# Patient Record
Sex: Female | Born: 1950
Health system: Southern US, Community
[De-identification: ages and names within clinical notes are randomized; demographics above are authoritative.]

## PROBLEM LIST (undated history)

## (undated) DIAGNOSIS — E785 Hyperlipidemia, unspecified: Secondary | ICD-10-CM

## (undated) DIAGNOSIS — Z9889 Other specified postprocedural states: Secondary | ICD-10-CM

## (undated) DIAGNOSIS — Z9989 Dependence on other enabling machines and devices: Secondary | ICD-10-CM

## (undated) DIAGNOSIS — R12 Heartburn: Secondary | ICD-10-CM

## (undated) DIAGNOSIS — M25461 Effusion, right knee: Secondary | ICD-10-CM

## (undated) DIAGNOSIS — R091 Pleurisy: Secondary | ICD-10-CM

## (undated) DIAGNOSIS — M659 Synovitis and tenosynovitis, unspecified: Secondary | ICD-10-CM

## (undated) DIAGNOSIS — M25561 Pain in right knee: Secondary | ICD-10-CM

## (undated) DIAGNOSIS — N289 Disorder of kidney and ureter, unspecified: Secondary | ICD-10-CM

## (undated) DIAGNOSIS — M1990 Unspecified osteoarthritis, unspecified site: Secondary | ICD-10-CM

## (undated) DIAGNOSIS — M109 Gout, unspecified: Secondary | ICD-10-CM

## (undated) DIAGNOSIS — M4800 Spinal stenosis, site unspecified: Secondary | ICD-10-CM

## (undated) DIAGNOSIS — M2550 Pain in unspecified joint: Secondary | ICD-10-CM

## (undated) DIAGNOSIS — G4730 Sleep apnea, unspecified: Secondary | ICD-10-CM

## (undated) DIAGNOSIS — R079 Chest pain, unspecified: Secondary | ICD-10-CM

## (undated) DIAGNOSIS — Z87442 Personal history of urinary calculi: Secondary | ICD-10-CM

## (undated) DIAGNOSIS — I499 Cardiac arrhythmia, unspecified: Secondary | ICD-10-CM

## (undated) DIAGNOSIS — J189 Pneumonia, unspecified organism: Secondary | ICD-10-CM

## (undated) DIAGNOSIS — M6530 Trigger finger, unspecified finger: Secondary | ICD-10-CM

## (undated) DIAGNOSIS — R002 Palpitations: Secondary | ICD-10-CM

## (undated) DIAGNOSIS — M65969 Unspecified synovitis and tenosynovitis, unspecified lower leg: Secondary | ICD-10-CM

## (undated) DIAGNOSIS — G589 Mononeuropathy, unspecified: Secondary | ICD-10-CM

## (undated) DIAGNOSIS — R0600 Dyspnea, unspecified: Secondary | ICD-10-CM

## (undated) DIAGNOSIS — I129 Hypertensive chronic kidney disease with stage 1 through stage 4 chronic kidney disease, or unspecified chronic kidney disease: Secondary | ICD-10-CM

## (undated) DIAGNOSIS — F32A Depression, unspecified: Secondary | ICD-10-CM

## (undated) DIAGNOSIS — E039 Hypothyroidism, unspecified: Secondary | ICD-10-CM

## (undated) DIAGNOSIS — I4891 Unspecified atrial fibrillation: Secondary | ICD-10-CM

## (undated) DIAGNOSIS — K219 Gastro-esophageal reflux disease without esophagitis: Secondary | ICD-10-CM

## (undated) DIAGNOSIS — E669 Obesity, unspecified: Secondary | ICD-10-CM

## (undated) DIAGNOSIS — Z91018 Allergy to other foods: Secondary | ICD-10-CM

## (undated) DIAGNOSIS — M5430 Sciatica, unspecified side: Secondary | ICD-10-CM

## (undated) DIAGNOSIS — G4733 Obstructive sleep apnea (adult) (pediatric): Secondary | ICD-10-CM

## (undated) DIAGNOSIS — D649 Anemia, unspecified: Secondary | ICD-10-CM

## (undated) DIAGNOSIS — E11319 Type 2 diabetes mellitus with unspecified diabetic retinopathy without macular edema: Secondary | ICD-10-CM

## (undated) DIAGNOSIS — E559 Vitamin D deficiency, unspecified: Secondary | ICD-10-CM

## (undated) DIAGNOSIS — M7989 Other specified soft tissue disorders: Secondary | ICD-10-CM

## (undated) DIAGNOSIS — I10 Essential (primary) hypertension: Secondary | ICD-10-CM

## (undated) DIAGNOSIS — R200 Anesthesia of skin: Secondary | ICD-10-CM

## (undated) DIAGNOSIS — R112 Nausea with vomiting, unspecified: Secondary | ICD-10-CM

## (undated) DIAGNOSIS — E7800 Pure hypercholesterolemia, unspecified: Secondary | ICD-10-CM

## (undated) DIAGNOSIS — S149XXA Injury of unspecified nerves of neck, initial encounter: Secondary | ICD-10-CM

## (undated) DIAGNOSIS — R0602 Shortness of breath: Secondary | ICD-10-CM

## (undated) DIAGNOSIS — F329 Major depressive disorder, single episode, unspecified: Secondary | ICD-10-CM

## (undated) DIAGNOSIS — K5900 Constipation, unspecified: Secondary | ICD-10-CM

## (undated) HISTORY — DX: Gout, unspecified: M10.9

## (undated) HISTORY — DX: Sleep apnea, unspecified: G47.30

## (undated) HISTORY — DX: Allergy to other foods: Z91.018

## (undated) HISTORY — DX: Pain in unspecified joint: M25.50

## (undated) HISTORY — DX: Hypertensive chronic kidney disease with stage 1 through stage 4 chronic kidney disease, or unspecified chronic kidney disease: I12.9

## (undated) HISTORY — DX: Type 2 diabetes mellitus with unspecified diabetic retinopathy without macular edema: E11.319

## (undated) HISTORY — DX: Other specified soft tissue disorders: M79.89

## (undated) HISTORY — DX: Spinal stenosis, site unspecified: M48.00

## (undated) HISTORY — DX: Disorder of kidney and ureter, unspecified: N28.9

## (undated) HISTORY — DX: Unspecified atrial fibrillation: I48.91

## (undated) HISTORY — DX: Heartburn: R12

## (undated) HISTORY — DX: Chest pain, unspecified: R07.9

## (undated) HISTORY — DX: Pure hypercholesterolemia, unspecified: E78.00

## (undated) HISTORY — DX: Obesity, unspecified: E66.9

## (undated) HISTORY — DX: Pleurisy: R09.1

## (undated) HISTORY — DX: Shortness of breath: R06.02

## (undated) HISTORY — DX: Constipation, unspecified: K59.00

## (undated) HISTORY — DX: Sciatica, unspecified side: M54.30

## (undated) HISTORY — PX: TONSILLECTOMY AND ADENOIDECTOMY: SUR1326

## (undated) HISTORY — DX: Palpitations: R00.2

## (undated) HISTORY — DX: Vitamin D deficiency, unspecified: E55.9

---

## 2000-02-14 ENCOUNTER — Encounter: Payer: Self-pay | Admitting: Internal Medicine

## 2000-02-14 ENCOUNTER — Encounter: Admission: RE | Admit: 2000-02-14 | Discharge: 2000-02-14 | Payer: Self-pay | Admitting: Internal Medicine

## 2000-07-05 ENCOUNTER — Encounter: Admission: RE | Admit: 2000-07-05 | Discharge: 2000-10-03 | Payer: Self-pay | Admitting: Internal Medicine

## 2000-10-16 ENCOUNTER — Encounter: Admission: RE | Admit: 2000-10-16 | Discharge: 2001-01-14 | Payer: Self-pay | Admitting: Internal Medicine

## 2001-03-27 ENCOUNTER — Encounter: Admission: RE | Admit: 2001-03-27 | Discharge: 2001-06-25 | Payer: Self-pay | Admitting: Internal Medicine

## 2001-12-28 ENCOUNTER — Encounter: Payer: Self-pay | Admitting: Internal Medicine

## 2001-12-28 ENCOUNTER — Encounter: Admission: RE | Admit: 2001-12-28 | Discharge: 2001-12-28 | Payer: Self-pay | Admitting: Internal Medicine

## 2003-02-11 ENCOUNTER — Encounter: Admission: RE | Admit: 2003-02-11 | Discharge: 2003-05-12 | Payer: Self-pay | Admitting: Internal Medicine

## 2003-09-09 ENCOUNTER — Ambulatory Visit (HOSPITAL_COMMUNITY): Admission: RE | Admit: 2003-09-09 | Discharge: 2003-09-09 | Payer: Self-pay | Admitting: Orthopaedic Surgery

## 2003-09-09 ENCOUNTER — Ambulatory Visit (HOSPITAL_BASED_OUTPATIENT_CLINIC_OR_DEPARTMENT_OTHER): Admission: RE | Admit: 2003-09-09 | Discharge: 2003-09-09 | Payer: Self-pay | Admitting: Orthopaedic Surgery

## 2003-09-09 HISTORY — PX: OTHER SURGICAL HISTORY: SHX169

## 2004-01-13 ENCOUNTER — Ambulatory Visit (HOSPITAL_BASED_OUTPATIENT_CLINIC_OR_DEPARTMENT_OTHER): Admission: RE | Admit: 2004-01-13 | Discharge: 2004-01-13 | Payer: Self-pay | Admitting: Orthopaedic Surgery

## 2004-01-13 ENCOUNTER — Ambulatory Visit (HOSPITAL_COMMUNITY): Admission: RE | Admit: 2004-01-13 | Discharge: 2004-01-13 | Payer: Self-pay | Admitting: Orthopaedic Surgery

## 2004-01-13 HISTORY — PX: OTHER SURGICAL HISTORY: SHX169

## 2004-09-07 ENCOUNTER — Ambulatory Visit (HOSPITAL_BASED_OUTPATIENT_CLINIC_OR_DEPARTMENT_OTHER): Admission: RE | Admit: 2004-09-07 | Discharge: 2004-09-07 | Payer: Self-pay | Admitting: Orthopaedic Surgery

## 2004-09-07 ENCOUNTER — Ambulatory Visit (HOSPITAL_COMMUNITY): Admission: RE | Admit: 2004-09-07 | Discharge: 2004-09-07 | Payer: Self-pay | Admitting: Orthopaedic Surgery

## 2004-09-07 HISTORY — PX: SHOULDER ARTHROSCOPY DISTAL CLAVICLE EXCISION AND OPEN ROTATOR CUFF REPAIR: SHX2396

## 2005-02-22 ENCOUNTER — Ambulatory Visit (HOSPITAL_COMMUNITY): Admission: RE | Admit: 2005-02-22 | Discharge: 2005-02-22 | Payer: Self-pay | Admitting: Orthopaedic Surgery

## 2005-02-22 ENCOUNTER — Ambulatory Visit (HOSPITAL_BASED_OUTPATIENT_CLINIC_OR_DEPARTMENT_OTHER): Admission: RE | Admit: 2005-02-22 | Discharge: 2005-02-22 | Payer: Self-pay | Admitting: Orthopaedic Surgery

## 2005-02-22 HISTORY — PX: OTHER SURGICAL HISTORY: SHX169

## 2005-07-20 ENCOUNTER — Encounter: Admission: RE | Admit: 2005-07-20 | Discharge: 2005-07-20 | Payer: Self-pay | Admitting: Internal Medicine

## 2005-07-29 ENCOUNTER — Encounter: Admission: RE | Admit: 2005-07-29 | Discharge: 2005-07-29 | Payer: Self-pay | Admitting: Internal Medicine

## 2005-11-29 ENCOUNTER — Ambulatory Visit (HOSPITAL_BASED_OUTPATIENT_CLINIC_OR_DEPARTMENT_OTHER): Admission: RE | Admit: 2005-11-29 | Discharge: 2005-11-29 | Payer: Self-pay | Admitting: Orthopaedic Surgery

## 2005-11-29 HISTORY — PX: SHOULDER ARTHROSCOPY W/ ACROMIAL REPAIR: SUR94

## 2006-11-28 ENCOUNTER — Ambulatory Visit (HOSPITAL_BASED_OUTPATIENT_CLINIC_OR_DEPARTMENT_OTHER): Admission: RE | Admit: 2006-11-28 | Discharge: 2006-11-28 | Payer: Self-pay | Admitting: Orthopaedic Surgery

## 2006-11-28 HISTORY — PX: OTHER SURGICAL HISTORY: SHX169

## 2008-08-26 ENCOUNTER — Ambulatory Visit (HOSPITAL_BASED_OUTPATIENT_CLINIC_OR_DEPARTMENT_OTHER): Admission: RE | Admit: 2008-08-26 | Discharge: 2008-08-26 | Payer: Self-pay | Admitting: Orthopaedic Surgery

## 2008-08-26 HISTORY — PX: OTHER SURGICAL HISTORY: SHX169

## 2009-03-12 ENCOUNTER — Ambulatory Visit (HOSPITAL_COMMUNITY): Admission: RE | Admit: 2009-03-12 | Discharge: 2009-03-12 | Payer: Self-pay | Admitting: Orthopaedic Surgery

## 2009-07-14 ENCOUNTER — Ambulatory Visit (HOSPITAL_BASED_OUTPATIENT_CLINIC_OR_DEPARTMENT_OTHER): Admission: RE | Admit: 2009-07-14 | Discharge: 2009-07-14 | Payer: Self-pay | Admitting: Orthopedic Surgery

## 2009-07-14 HISTORY — PX: OTHER SURGICAL HISTORY: SHX169

## 2009-12-14 ENCOUNTER — Inpatient Hospital Stay (HOSPITAL_COMMUNITY): Admission: RE | Admit: 2009-12-14 | Discharge: 2009-12-17 | Payer: Self-pay | Admitting: Orthopedic Surgery

## 2009-12-14 HISTORY — PX: TOTAL KNEE ARTHROPLASTY: SHX125

## 2010-01-13 ENCOUNTER — Ambulatory Visit (HOSPITAL_COMMUNITY): Admission: RE | Admit: 2010-01-13 | Discharge: 2010-01-13 | Payer: Self-pay | Admitting: Orthopedic Surgery

## 2010-01-13 ENCOUNTER — Ambulatory Visit: Payer: Self-pay | Admitting: Vascular Surgery

## 2010-01-13 ENCOUNTER — Encounter (INDEPENDENT_AMBULATORY_CARE_PROVIDER_SITE_OTHER): Payer: Self-pay | Admitting: Orthopedic Surgery

## 2010-05-27 LAB — CBC
HCT: 29.8 % — ABNORMAL LOW (ref 36.0–46.0)
HCT: 30.6 % — ABNORMAL LOW (ref 36.0–46.0)
Hemoglobin: 10.2 g/dL — ABNORMAL LOW (ref 12.0–15.0)
Hemoglobin: 10.3 g/dL — ABNORMAL LOW (ref 12.0–15.0)
Hemoglobin: 10.6 g/dL — ABNORMAL LOW (ref 12.0–15.0)
MCH: 30.1 pg (ref 26.0–34.0)
MCH: 31.2 pg (ref 26.0–34.0)
MCH: 29.9 pg (ref 26.0–34.0)
MCHC: 33.5 g/dL (ref 30.0–36.0)
MCHC: 34.5 g/dL (ref 30.0–36.0)
MCHC: 33.3 g/dL (ref 30.0–36.0)
MCV: 89.7 fL (ref 78.0–100.0)
MCV: 89.9 fL (ref 78.0–100.0)
Platelets: 281 10*3/uL (ref 150–400)
RBC: 3.54 MIL/uL — ABNORMAL LOW (ref 3.87–5.11)

## 2010-05-27 LAB — GLUCOSE, CAPILLARY
Glucose-Capillary: 108 mg/dL — ABNORMAL HIGH (ref 70–99)
Glucose-Capillary: 151 mg/dL — ABNORMAL HIGH (ref 70–99)
Glucose-Capillary: 167 mg/dL — ABNORMAL HIGH (ref 70–99)
Glucose-Capillary: 169 mg/dL — ABNORMAL HIGH (ref 70–99)
Glucose-Capillary: 176 mg/dL — ABNORMAL HIGH (ref 70–99)
Glucose-Capillary: 203 mg/dL — ABNORMAL HIGH (ref 70–99)
Glucose-Capillary: 87 mg/dL (ref 70–99)

## 2010-05-27 LAB — URINALYSIS, ROUTINE W REFLEX MICROSCOPIC
Bilirubin Urine: NEGATIVE
Ketones, ur: NEGATIVE mg/dL
Nitrite: NEGATIVE
Specific Gravity, Urine: 1.019 (ref 1.005–1.030)
Urobilinogen, UA: 1 mg/dL (ref 0.0–1.0)
pH: 5.5 (ref 5.0–8.0)

## 2010-05-27 LAB — BASIC METABOLIC PANEL
BUN: 18 mg/dL (ref 6–23)
Chloride: 102 mEq/L (ref 96–112)
GFR calc non Af Amer: 50 mL/min — ABNORMAL LOW (ref >60)
Glucose, Bld: 141 mg/dL — ABNORMAL HIGH (ref 70–99)
Potassium: 4.2 mEq/L (ref 3.5–5.1)
Potassium: 4.7 mEq/L (ref 3.5–5.1)
Sodium: 135 mEq/L (ref 135–145)

## 2010-05-27 LAB — URINE MICROSCOPIC-ADD ON

## 2010-05-27 LAB — ABO/RH: ABO/RH(D): A POS

## 2010-05-27 LAB — MRSA CULTURE

## 2010-05-27 LAB — APTT: aPTT: 31 seconds (ref 24–37)

## 2010-05-27 LAB — PROTIME-INR
INR: 1.11 (ref 0.00–1.49)
INR: 1.47 (ref 0.00–1.49)
INR: 1.04 (ref 0.00–1.49)
Prothrombin Time: 14.5 seconds (ref 11.6–15.2)
Prothrombin Time: 18.9 seconds — ABNORMAL HIGH (ref 11.6–15.2)

## 2010-05-27 LAB — COMPREHENSIVE METABOLIC PANEL
AST: 22 U/L (ref 0–37)
Albumin: 3.7 g/dL (ref 3.5–5.2)
BUN: 23 mg/dL (ref 6–23)
Calcium: 9.3 mg/dL (ref 8.4–10.5)
Creatinine, Ser: 1.29 mg/dL — ABNORMAL HIGH (ref 0.4–1.2)
GFR calc Af Amer: 51 mL/min — ABNORMAL LOW (ref >60)
GFR calc non Af Amer: 42 mL/min — ABNORMAL LOW (ref >60)

## 2010-06-01 LAB — BASIC METABOLIC PANEL
BUN: 25 mg/dL — ABNORMAL HIGH (ref 6–23)
CO2: 28 mEq/L (ref 19–32)
Calcium: 9.2 mg/dL (ref 8.4–10.5)
Chloride: 105 mEq/L (ref 96–112)
Creatinine, Ser: 1.1 mg/dL (ref 0.4–1.2)
GFR calc Af Amer: >60 mL/min (ref >60)
Glucose, Bld: 70 mg/dL (ref 70–99)

## 2010-06-01 LAB — GLUCOSE, CAPILLARY: Glucose-Capillary: 104 mg/dL — ABNORMAL HIGH (ref 70–99)

## 2010-06-01 LAB — POCT HEMOGLOBIN-HEMACUE: Hemoglobin: 10.5 g/dL — ABNORMAL LOW (ref 12.0–15.0)

## 2010-06-21 LAB — BASIC METABOLIC PANEL
BUN: 21 mg/dL (ref 6–23)
CO2: 26 mEq/L (ref 19–32)
Calcium: 10 mg/dL (ref 8.4–10.5)
Creatinine, Ser: 0.9 mg/dL (ref 0.4–1.2)
GFR calc non Af Amer: >60 mL/min (ref >60)
Glucose, Bld: 174 mg/dL — ABNORMAL HIGH (ref 70–99)
Sodium: 140 mEq/L (ref 135–145)

## 2010-06-21 LAB — GLUCOSE, CAPILLARY: Glucose-Capillary: 157 mg/dL — ABNORMAL HIGH (ref 70–99)

## 2010-07-27 NOTE — Op Note (Signed)
NAME:  [name], [name]                 ACCOUNT NO.:  [id number]   MEDICAL RECORD NO.:  [id number]          PATIENT TYPE:  AMB   LOCATION:  DSC                          FACILITY:  [facility]   PHYSICIAN:  Lubertha Basque. Dalldorf, M.D.DATE OF BIRTH:  1950/04/24   DATE OF PROCEDURE:  08/26/2008  DATE OF DISCHARGE:                               OPERATIVE REPORT   PREOPERATIVE DIAGNOSES:  1. Left carpal tunnel syndrome.  2. Left middle trigger finger.  3. Left ring trigger finger.   POSTOPERATIVE DIAGNOSES:  1. Left carpal tunnel syndrome.  2. Left middle trigger finger.  3. Left ring trigger finger.   PROCEDURES:  1. Left carpal tunnel release.  2. Left middle trigger finger release.  3. Left ring trigger finger release.   ANESTHESIA:  Bier block MAC.   ATTENDING SURGEON:  Lubertha Basque. Jerl Santos, MD   ASSISTANT:  Lindwood Qua, PA   INDICATIONS FOR PROCEDURE:  The patient is a 60 year old woman with a  long history of bilateral hand difficulties.  She is status post a  successful carpal tunnel release and trigger finger release on the one  side and now has the same problem on the left.  She has struggled with  difficulty despite various injections and bracing.  By nerve conduction  study, she does have advanced carpal tunnel on the left and at this  point, she is offered operative intervention.  Informed operative  consent was obtained after discussion of possible complications  including reaction to anesthesia, infection, and neurovascular injury.   SUMMARY/FINDINGS AND PROCEDURE:  Under Bier block and MAC, a set of left  knee hand procedures were performed.  We initially performed a carpal  tunnel release.  She had a severely thickened transverse carpal  ligament, which we released.  We then performed trigger finger releases  of the long and ring fingers.  The ring finger seemed to have the most  significantly thickened pulley.  All 3 sites were closed primarily.   DESCRIPTION OF  PROCEDURE:  The patient was taken to operating suite  where Bier block was applied to the level of the forearm.  She was also  given some sedation.  We also supplemented with some local lidocaine.  She was prepped and draped in normal sterile fashion.  After  administration of the preop IV Kefzol, a small palmar incision was made  ulnar to the thenar flexion crease.  This did not cross the wrist  flexion crease.  Dissection was carried down through palmar fascia to  expose the transverse carpal ligament, which was severely thickened and  released under direct visualization.  The dissection was taken distally  towards the transverse arch of vessels and proximally to the distal  fascia of the forearm.  This did appear to be applying some significant  compression to the underlying median nerve.  This wound was irrigated  followed by reapproximation of skin with nylon.  We then made an  incision along the middle finger flexor tendon sheath region.  This was  done transversely between the proximal and distal palmar flexion  creases.  Dissection  was carried down to the flexor tendon sheath and  the A0 and A1 pulleys were released under direct visualization.  We then  made an incision along the ring finger flexor tendon sheath at the  distal palmar flexion crease.  Dissection was carried down through the  transverse incision to expose the flexor tendon sheath and the sheath  was released here as well.  This seemed to be thicker than the sheath at  the middle finger.  Nevertheless, both of these wounds were then  irrigated followed by reapproximation of the skin with nylon in both  sites.  Adaptic was applied at all 3 sites followed by dry gauze and a  volar splint of plaster with the wrist in slight extension.  The  tourniquet was deflated, and the fingers became pink and warm  immediately.  Estimated blood loss, intraoperative fluids, as well as  accurate tourniquet time can be obtained from the  anesthesia records.   DISPOSITION:  The patient was taken to recovery room in stable addition.  She was to go home same day and follow up with me in my office next  week.  She stayed awake through the entire procedure.      Lubertha Basque Jerl Santos, [name]  Electronically Signed     PGD/MEDQ  D:  08/26/2008  T:  08/26/2008  Job:  [id number]

## 2010-07-27 NOTE — Op Note (Signed)
NAME:  [name], [name]                 ACCOUNT NO.:  [id number]   MEDICAL RECORD NO.:  [id number]          PATIENT TYPE:  AMB   LOCATION:  DSC                          FACILITY:  [facility]   PHYSICIAN:  Lubertha Basque. Dalldorf, M.D.DATE OF BIRTH:  [birth year]   DATE OF PROCEDURE:  11/28/2006  DATE OF DISCHARGE:                               OPERATIVE REPORT   PREOPERATIVE DIAGNOSIS:  1. Right trigger thumb.  2. Right carpal tunnel syndrome.   POSTOPERATIVE DIAGNOSIS:  1. Right trigger thumb.  2. Right carpal tunnel syndrome.   PROCEDURE:  1. Right trigger thumb release.  2. Right carpal tunnel release.   ANESTHESIA:  Bier block and MAC.   ATTENDING SURGEON:  Lubertha Basque. Jerl Santos, [name]   ASSISTANT:  Lindwood Qua, P.A.-C.   INDICATIONS FOR PROCEDURE:  The patient is a 60 year old woman with a  long history of a painful trigger thumb and numbness in her median  distribution of the right hand.  She has had multiple trigger thumb  injections and all have afforded her transient relief.  She has nerve  conduction finding consistent with bilateral severe carpal tunnel  syndrome.  She has also failed splinting for her carpal tunnel syndrome.  At this point, she is offered operative intervention at the right hand  to consist of a trigger thumb release and a carpal tunnel release.  Informed operative consent was obtained after a discussion of the  possible complications of reaction to anesthesia, infection,  neurovascular injury.   SUMMARY OF FINDINGS AND PROCEDURE:  Under Bier block and MAC, two  procedures were performed on the right hand.  First, I made a small  incision at the MCP flexion crease and performed an A1 pulley release  addressing her trigger thumb.  We then performed a separate small palmar  incision and released a very thick transverse carpal ligament addressing  her carpal tunnel syndrome issue.   DESCRIPTION OF PROCEDURE:  The patient was taken to the operating suite  where  a Bier block was applied without difficulty.  She was also given  some sedation.  She was positioned supine and prepped and draped in a  normal sterile fashion.  After the administration of IV Kefzol, a small  incision was made at the MCP flexion crease of the thumb.  Dissection  was carried down to the flexor tendon sheath.  An area consistent with  the A1 pulley was released with scissors.  At this point, we had the  patient actively flex and extend her thumb and no active triggering  remained.  This wound was irrigated followed by reapproximation of the  skin with vertical mattresses of nylon.   We then made a small palmar incision ulnar to the thenar flexion crease.  This did not cross the wrist flexion crease.  Dissection was carried  down through the palmar fascia to expose the transverse carpal ligament.  This was released under direct visualization.  This was moderately  thickened applying compression to the nerve below.  Dissection was taken  distally to the transverse arch of vessels and proximally through the  distal fascia of the forearm.  This wound was irrigated followed by  reapproximation of the skin with vertical mattresses of nylon.  We  injected some Marcaine about the skin edges at both incision sites.  Adaptic was applied to both incisions followed by dry gauze and a volar  splint of plaster with the wrist in slight extension.  Estimated blood  loss and intraoperative fluids can be obtained from anesthesia records  as can accurate tourniquet time.  We did release the tourniquet at the  end of closure.   DISPOSITION:  The patient was taken to the recovery room in stable  addition.  She was to go home same day and follow up in the office in  less than a week.  I will contact her by phone tonight.      Lubertha Basque Jerl Santos, [name]  Electronically Signed     PGD/MEDQ  D:  11/28/2006  T:  11/28/2006  Job:  [id number]

## 2010-07-30 NOTE — Op Note (Signed)
NAME:  [name], [name]                 ACCOUNT NO.:  [id number]   MEDICAL RECORD NO.:  [id number]          PATIENT TYPE:  AMB   LOCATION:  DSC                          FACILITY:  [facility]   PHYSICIAN:  Lubertha Basque. Dalldorf, M.D.DATE OF BIRTH:  08-19-1950   DATE OF PROCEDURE:  01/13/2004  DATE OF DISCHARGE:                                 OPERATIVE REPORT   PREOPERATIVE DIAGNOSES:  1.  Right shoulder impingement.  2.  Right shoulder partial rotator cuff tear.   PROCEDURE:  1.  Right shoulder arthroscopic acromioplasty.  2.  Right shoulder arthroscopic rotator cuff debridement.   ANESTHESIA:  General.   ATTENDING SURGEON:  Lubertha Basque. Jerl Santos, [name]   ASSISTANT:  Lindwood Qua, P.A.   INDICATIONS FOR PROCEDURE:  The patient is a 60 year old woman with a long  history of right shoulder pain.  This has persisted despite oral anti-  inflammatories and rest.  She is status post a successful arthroscopy of the  opposite shoulder for similar difficulty and elects to have the same  procedure on this side.  Informed operative consent was obtained after  discussion of possible complication of reaction to anesthesia and infection.   DESCRIPTION OF PROCEDURE:  The patient was taken to the operating suite  where general anesthetic was supplied without difficulty.  She was  positioned in beach chair position and prepped and draped in a normal  sterile fashion.  After administration of IV antibiotics, an arthroscopy of  the right shoulder was performed through two portals.  Glenohumeral joint  showed no degenerative change and the biceps tendon appeared benign.  The  rotator cuff did have a partial thickness tear seen from below, but this  encompassed less than 10% of the thickness of the rotator cuff.  From above,  the rotator cuff appeared benign after bursectomy and debridement.  She did  have a prominent subacromial spur addressed with an acromioplasty back to a  flat surface.  This was done with  the bur in the lateral position followed  by transfer of the bur to the posterior position.  The [facility] joint was not  addressed as she had no pain in that location, and I found no spurs  arthroscopically.  The shoulder was thoroughly irrigated at the end of the  case followed by placement of Marcaine with epinephrine and morphine.  Simple sutures of nylon were used to loosely reapproximate the portals  followed by Adaptic and a dry gauze dressing with tape.  Estimated blood  loss and fluids can be obtained from anesthesia records.   DISPOSITION:  The patient was extubated in the operating room and taken to  the recovery room in stable condition.  Plans were for her to go home the  same day and follow up in less than a week.  I will contact her by phone  tonight.       PGD/MEDQ  D:  01/13/2004  T:  01/13/2004  Job:  [id number]

## 2010-07-30 NOTE — Op Note (Signed)
NAME:  [name], [name]                 ACCOUNT NO.:  [id number]   MEDICAL RECORD NO.:  [id number]          PATIENT TYPE:  AMB   LOCATION:  DSC                          FACILITY:  [facility]   PHYSICIAN:  Lubertha Basque. Dalldorf, M.D.DATE OF BIRTH:  07-Mar-1951   DATE OF PROCEDURE:  02/22/2005  DATE OF DISCHARGE:                                 OPERATIVE REPORT   PREOPERATIVE DIAGNOSIS:  Left shoulder recurrent rotator cuff tear.  1.  Left trigger thumb   POSTOPERATIVE DIAGNOSES:  1.  Left shoulder adhesions.  2.  Left trigger thumb.   PROCEDURES:  1.  Left shoulder arthroscopic debridement.  2.  Left trigger thumb release.   ANESTHESIA:  General.   ATTENDING SURGEON:  Lubertha Basque. Jerl Santos, [name]   ASSISTANT:  Lindwood Qua, P.A.   INDICATIONS FOR PROCEDURE:  The patient is a 60 year old woman with a  complicated history of left shoulder difficulty.  Having failed conservative  measures, she underwent an arthroscopy about a year ago where we performed  an acromioplasty and debridement of a partial-thickness tear.  She did not  get better and about six months ago we went back and performed an open  repair of this partial tear.  Unfortunately, she has persisted with some  pain with overhead use, which is disabling to her, and she is offered  another procedure after an MRI scan recently showed a nonretracted full-  thickness rotator cuff tear.  She has also been troubled with a trigger  thumb for many months and has had injection, which have helped in a  transient fashion.  She elects to have this addressed at the same setting.  Informed operative consent was obtained after discussion of the possible  complications of and reaction to anesthesia, infection, neurovascular injury  related to today's procedures.   DESCRIPTION OF PROCEDURE:  The patient was taken to the operating suite,  where a general anesthetic was applied without difficulty.  She was  positioned in beach-chair position and  prepped and draped in a normal  sterile fashion.  The entire left arm was prepped to the fingertips.  After  the administration of preop IV Kefzol, an arthroscopy of the left shoulder  was performed through three old portals.  The glenohumeral joint showed no  degenerative changes, and the biceps tendon and biceps anchor appeared  intact in the joint.  The rotator cuff appeared to be intact from below.  She did have an abundance of scar tissue in the glenohumeral joint, and this  was addressed with a debridement.  I then entered the subacromial space and  again I found no evidence of a rotator cuff tear.  The superior surface of  the cuff looked smooth and I could find no irregularity to suggest any  tearing.  She did not have any significant impingement, and no additional  bone work was done.  The shoulder was irrigated followed by placement of  Marcaine with epinephrine and morphine.  Simple sutures of nylon was used to  loosely reapproximate the three portals, followed by Adaptic, dry gauze.  We  then addressed  the left thumb.  Her arm was exsanguinated, followed by  reinflation of a tourniquet about her forearm to 250 pounds of pressure.  A  small incision was made in the thenar flexion crease with dissection down to  the flexor tendon sheath.  I released the tight pulley in the area her  triggering and she seemed to move very well thereafter, though obviously  this was passive motion.  Care was taken to retract the neurovascular bundle  out of harm's way during the case.  This wound was irrigated, followed by  release of the tourniquet.  The fingertip became pink and warm and a small  amount of bleeding was easily controlled with some pressure.  A couple of  nylon sutures were used to loosely reapproximate this incision, followed by  Adaptic and dry gauze with a loose Ace wrap.  Some tape was applied to her  shoulder dressing.  Estimated blood loss and intraoperative fluids can be   obtained from anesthesia records, as can accurate tourniquet time.   DISPOSITION:  The patient was extubated in the operating room and taken to  the recovery room in stable addition.  Plans were for her to go home the same day and to follow up in the office in  less than a week.  I will contact her by phone tonight.      Lubertha Basque Jerl Santos, [name]  Electronically Signed     PGD/MEDQ  D:  02/22/2005  T:  02/23/2005  Job:  [id number]

## 2010-07-30 NOTE — Op Note (Signed)
NAME:  [name], [name]                 ACCOUNT NO.:  [id number]   MEDICAL RECORD NO.:  [id number]          PATIENT TYPE:  AMB   LOCATION:  DSC                          FACILITY:  [facility]   PHYSICIAN:  Lubertha Basque. Dalldorf, M.D.DATE OF BIRTH:  1950-06-07   DATE OF PROCEDURE:  DATE OF DISCHARGE:  09/07/2004                                 OPERATIVE REPORT   PREOPERATIVE DIAGNOSIS:  Left shoulder partial-thickness rotator cuff tear.   POSTOPERATIVE DIAGNOSIS:  Left shoulder partial-thickness rotator cuff tear.   PROCEDURES:  1. Left shoulder arthroscopic debridement.  2. Left shoulder open rotator cuff repair.   ANESTHESIA:  General and block.   ATTENDING SURGEON:  Lubertha Basque. Jerl Santos, [name]   ASSISTANT:  Lindwood Qua, P.A.   INDICATION FOR PROCEDURE:  The patient is a 60 year old woman is almost one  year to the day to an arthroscopy of her left shoulder. At that point, we  performed an acromioplasty and a debridement of a partial-thickness rotator  cuff tear seen on the articular surface. Unfortunately she has persisted  with pain. She has trouble using this arm for meaningful purposes above her  head and out to the side and also has difficulty sleeping on it at night.  She has failed extensive physical therapy and postoperative subacromial  injections. She is status post a successful decompression on the opposite  shoulder. At this point she is offered repeat operation. An MRI scan has  again shown a partial-thickness tear. She is offered an arthroscopy with  probable open repair of a rotator cuff partial tear. Informed operative  consent was obtained after discussion of the possible applications of  reaction to anesthesia and infection.   DESCRIPTION OF PROCEDURE:  The patient was taken to the operating suite  where general anesthetic was supplied without difficulty. She also given a  block in the preanesthesia area. She was positioned in beach chair position  and prepped and  draped in normal sterile fashion. After administration of IV  antibiotics, an arthroscopy of the left shoulder was performed through a  total of two portals. The glenohumeral joint showed no degenerative changes  and the biceps anchor appeared intact. The rotator cuff still had about a 40-  50% partial-thickness tear seen from below. In the subacromial space, things  looked relatively normal with some bursitis debrided, but the rotator cuff  appeared benign. No additional decompression was done of the subacromial  space. I then went back to the glenohumeral joint and passed a tag suture  through the rotator cuff to the partially torn area. I then made an open  approach through a deltoid split and a small anterolateral incision. I  traced our tag stitch down to the rotator cuff and found the area of the  partial-thickness tear which could more easily be seen from below. I used a  15 blade to elevate this tear, effectively completing the tear. I created  about a 1 cm rotator cuff tear and excised some devitalized tissue. I  created a bleeding bed of bone with a rongeur. A single 5.5 mm Arthrex  anchor  was placed fairly medial followed by placement of all four limbs of  the suture through the rotator cuff. These sutures were tied on top of the  rotator cuff and then I used to of the push lock anchors by Arthrex to  repair this rotator cuff broadly in a smooth fashion to the bleeding bed of  bone. I then placed the scope back in the shoulder and from the glenohumeral  joint her rotator cuff did appear to be repaired as planned. The shoulder  was thoroughly irrigated followed by reapproximation of the fascial layers  with Vicryl and the subcutaneous tissues with the same suture. The skin was  closed with nylon. Some Marcaine was injected about the incision site  followed by Adaptic and a dry gauze dressing. We also used nylon to  reapproximate her portals loosely. Estimated blood loss and fluids  can be  obtained from the anesthesia records.   DISPOSITION:  The patient was extubated in the operating room and taken to  the recovery room in stable addition. Plans were for her to go home the same  day and to follow up in the office in less than a week.  I will contact by  phone tonight.       PGD/MEDQ  D:  09/07/2004  T:  09/07/2004  Job:  [id number]

## 2010-07-30 NOTE — Op Note (Signed)
NAME:  [name], [name]                             ACCOUNT NO.:  [id number]   MEDICAL RECORD NO.:  [id number]                   PATIENT TYPE:  AMB   LOCATION:  DSC                                  FACILITY:  [facility]   PHYSICIAN:  Lubertha Basque. Jerl Santos, [name]             DATE OF BIRTH:  05-01-1950   DATE OF PROCEDURE:  09/09/2003  DATE OF DISCHARGE:                                 OPERATIVE REPORT   PREOPERATIVE DIAGNOSES:  1. Left shoulder impingement.  2. Left shoulder partial rotator cuff tear.   POSTOPERATIVE DIAGNOSES:  1. Left shoulder impingement.  2. Left shoulder partial rotator cuff tear.   OPERATION PERFORMED:  1. Left shoulder arthroscopic acromioplasty.  2. Left shoulder arthroscopic debridement.   SURGEON:  Lubertha Basque. Jerl Santos, [name]   ASSISTANT:  Prince Rome, P.A.   ANESTHESIA:  General.   INDICATIONS FOR PROCEDURE:  The patient is a 60 year old woman with about a  year of left shoulder pain.  This has persisted despite an exercise program  and a subacromial injection which did afford her transient relief.  She has  a prominent subacromial morphology.  She has not had an MRI scan.  She has  pain which limits her ability to rest and limits her ability to use her arm  overhead and out in front of herself.  She was offered an arthroscopy.  Informed operative consent was obtained after discussion of possible  complications of reaction to anesthesia and infection.   DESCRIPTION OF PROCEDURE:  The patient was taken to the operating suite  where general anesthetic was applied without difficulty.  The patient was  positioned in beach chair position and prepped and draped in the normal  sterile fashion.  After the administration of preop intravenous antibiotics,  an arthroscopy of the left shoulder was performed through a total of three  portals.  Glenohumeral joint showed no degenerative change and the biceps  anchor and labral structures appeared intact.  She did have an  articular  surface partial rotator cuff tear at the supraspinatus attachment point.  This was about 40% the thickness of the rotator cuff and was thoroughly  debrided.  In the subacromial space she had some irritation of the bursal  surface of the cuff but no tear was seen in this location.  She had a  prominent subacromial morphology which we addressed with an acromioplasty  back to a flat surface.  This was done with the bur in the lateral position  followed by transfer of the bur to the posterior position.  A thorough  bursectomy was done and then the entire rotator cuff was thoroughly  inspected from above.  The shoulder was irrigated at the end of the case  followed by placement of Marcaine with epinephrine and morphine.  Simple  sutures of nylon were used to loosely reapproximate the portals followed by  Adaptic and dry gauze dressing with  tape.  Estimated blood loss and  intraoperative fluids can be obtained from anesthesia records.   DISPOSITION:  The patient was extubated in the operating room and taken to  the recovery room in stable condition.  Plans were for the patient to go  home the same day and to follow up in the office in less than a week.  I  will contact her by phone tonight.                                               Lubertha Basque Jerl Santos, [name]    PGD/MEDQ  D:  09/09/2003  T:  09/09/2003  Job:  [id number]

## 2010-07-30 NOTE — Op Note (Signed)
NAME:  [name], [name]                 ACCOUNT NO.:  [id number]   MEDICAL RECORD NO.:  [id number]          PATIENT TYPE:  AMB   LOCATION:  DSC                          FACILITY:  [facility]   PHYSICIAN:  Lubertha Basque. Dalldorf, M.D.DATE OF BIRTH:  1950-06-01   DATE OF PROCEDURE:  11/29/2005  DATE OF DISCHARGE:                                 OPERATIVE REPORT   PREOPERATIVE DIAGNOSIS:  Left shoulder acromioclavicular degeneration.   POSTOPERATIVE DIAGNOSIS:  Left shoulder acromioclavicular degeneration.   PROCEDURE:  1. Left shoulder arthroscopic AC resection.  2. Left shoulder arthroscopic debridement.   ANESTHESIA:  General.   ATTENDING SURGEON:  Lubertha Basque. Jerl Santos, [name]   ASSISTANT:  Lindwood Qua, P.A.   INDICATIONS FOR PROCEDURE:  The patient is a 60 year old woman with a long  complicated history of left shoulder difficulty.  She initially was  addressed with an acromioplasty having failed conservative measures to treat  impingement related pain.  She next came to an open assisted rotator cuff  repair.  She subsequently had a third procedure about 9 months ago for lysis  of adhesions.  She has regained her motion but unfortunately has been having  pain at her [facility] joint.  We have injected her on three occasions with  transient relief achieved with each of these.  She has pain at the [facility] joint  which now limits her ability use her arm and to rest on this side.  She is  offered another procedure on her shoulder to consist of an arthroscopy with  the [facility] resection.  Informed operative consent was obtained discussion of  possible complications of reaction to anesthesia and infection.   SUMMARY OF FINDINGS AND PROCEDURE:  Under general anesthesia through three  portals an arthroscopy of the left shoulder was performed.  The glenohumeral  joint showed no degenerative changes and the biceps tendon appeared normal  throughout.  The rotator cuff appeared attached from below but certainly not  normal.  From above the cuff appeared completely normal.  A thorough  bursectomy was done and no significant tearing could be seen.  One of her  old sutures was well visualized.  No additional acromioplasty was performed  but at the [facility] joint there was bone-on-bone contact with a spur at the distal  clavicle.  A formal AC decompression was done with the bur through the  additional anterior portal.  Bryna Colander assisted throughout and was  invaluable to the completion of the case in that he helped pass instruments  and position so I could perform the procedure.  He also closed portals at  the end of the case.   DESCRIPTION OF PROCEDURE:  The patient was taken to operating suite where  general anesthetic was applied without difficulty.  She was positioned in  beach-chair position and prepped, draped in normal sterile fashion.  After  the administration of preop IV Kefzol, an arthroscopy of the left shoulder  was performed through a total of three portals.  Findings were as noted  above and procedure consisted of an [facility] resection with an arthroscopic bur  and a  debridement of partial rotator cuff tear seen from below and bursitis  seen from above.  She was thoroughly irrigated at end of the case followed  by reapproximation of portals loosely with nylon.  Some Marcaine with  epinephrine and morphine were injected in the subacromial space.  Adaptic  was applied to the wounds followed by dry gauze and tape.  Estimated blood  loss and intraoperative fluids can be obtained from anesthesia records.   DISPOSITION:  The patient was extubated in the operating room and taken to  recovery in stable addition.  She was to go home the same day and follow up  in the office in less than a week.  I will contact her by phone tonight.      Lubertha Basque Jerl Santos, [name]  Electronically Signed     PGD/MEDQ  D:  11/29/2005  T:  11/30/2005  Job:  [id number]

## 2010-11-26 ENCOUNTER — Other Ambulatory Visit (HOSPITAL_COMMUNITY): Payer: Self-pay | Admitting: Orthopedic Surgery

## 2010-11-26 DIAGNOSIS — M25561 Pain in right knee: Secondary | ICD-10-CM

## 2010-11-26 DIAGNOSIS — T84032A Mechanical loosening of internal right knee prosthetic joint, initial encounter: Secondary | ICD-10-CM

## 2010-12-08 ENCOUNTER — Encounter (HOSPITAL_COMMUNITY)
Admission: RE | Admit: 2010-12-08 | Discharge: 2010-12-08 | Disposition: A | Discharge status: Home / Self Care | Payer: 59 | Source: Ambulatory Visit | Attending: Orthopedic Surgery | Admitting: Orthopedic Surgery

## 2010-12-08 DIAGNOSIS — T84032A Mechanical loosening of internal right knee prosthetic joint, initial encounter: Secondary | ICD-10-CM

## 2010-12-08 DIAGNOSIS — M25561 Pain in right knee: Secondary | ICD-10-CM

## 2010-12-08 DIAGNOSIS — Z96659 Presence of unspecified artificial knee joint: Secondary | ICD-10-CM | POA: Insufficient documentation

## 2010-12-08 DIAGNOSIS — M25569 Pain in unspecified knee: Secondary | ICD-10-CM | POA: Insufficient documentation

## 2010-12-08 MED ORDER — TECHNETIUM TC 99M MEDRONATE IV KIT
24.0000 | PACK | Freq: Once | INTRAVENOUS | Status: AC | PRN
  Administered 2010-12-08: 24 via INTRAVENOUS

## 2010-12-24 LAB — BASIC METABOLIC PANEL
CO2: 28
Calcium: 9
GFR calc Af Amer: >60
Glucose, Bld: 212 — ABNORMAL HIGH
Potassium: 4.4
Sodium: 138

## 2011-01-12 ENCOUNTER — Other Ambulatory Visit (HOSPITAL_COMMUNITY): Payer: Self-pay | Admitting: Orthopaedic Surgery

## 2011-01-12 DIAGNOSIS — M5410 Radiculopathy, site unspecified: Secondary | ICD-10-CM

## 2011-01-17 ENCOUNTER — Ambulatory Visit (HOSPITAL_COMMUNITY)
Admission: RE | Admit: 2011-01-17 | Discharge: 2011-01-17 | Disposition: A | Discharge status: Home / Self Care | Payer: 59 | Source: Ambulatory Visit | Attending: Orthopaedic Surgery | Admitting: Orthopaedic Surgery

## 2011-01-17 DIAGNOSIS — M4802 Spinal stenosis, cervical region: Secondary | ICD-10-CM | POA: Insufficient documentation

## 2011-01-17 DIAGNOSIS — M5030 Other cervical disc degeneration, unspecified cervical region: Secondary | ICD-10-CM | POA: Insufficient documentation

## 2011-01-17 DIAGNOSIS — M47812 Spondylosis without myelopathy or radiculopathy, cervical region: Secondary | ICD-10-CM | POA: Insufficient documentation

## 2011-01-17 DIAGNOSIS — M5020 Other cervical disc displacement, unspecified cervical region: Secondary | ICD-10-CM | POA: Insufficient documentation

## 2011-01-17 DIAGNOSIS — M542 Cervicalgia: Secondary | ICD-10-CM | POA: Insufficient documentation

## 2011-01-17 DIAGNOSIS — M5410 Radiculopathy, site unspecified: Secondary | ICD-10-CM

## 2011-02-17 ENCOUNTER — Other Ambulatory Visit: Payer: Self-pay | Admitting: Orthopedic Surgery

## 2011-02-23 ENCOUNTER — Encounter: Payer: Self-pay | Admitting: Adult Health

## 2011-02-23 ENCOUNTER — Emergency Department (HOSPITAL_COMMUNITY): Payer: 59

## 2011-02-23 ENCOUNTER — Emergency Department (HOSPITAL_COMMUNITY)
Admission: EM | Admit: 2011-02-23 | Discharge: 2011-02-23 | Disposition: A | Discharge status: Home / Self Care | Payer: 59 | Attending: Emergency Medicine | Admitting: Emergency Medicine

## 2011-02-23 DIAGNOSIS — N200 Calculus of kidney: Secondary | ICD-10-CM | POA: Insufficient documentation

## 2011-02-23 DIAGNOSIS — N9489 Other specified conditions associated with female genital organs and menstrual cycle: Secondary | ICD-10-CM | POA: Insufficient documentation

## 2011-02-23 DIAGNOSIS — N390 Urinary tract infection, site not specified: Secondary | ICD-10-CM | POA: Insufficient documentation

## 2011-02-23 DIAGNOSIS — R109 Unspecified abdominal pain: Secondary | ICD-10-CM | POA: Insufficient documentation

## 2011-02-23 DIAGNOSIS — Z794 Long term (current) use of insulin: Secondary | ICD-10-CM | POA: Insufficient documentation

## 2011-02-23 DIAGNOSIS — N201 Calculus of ureter: Secondary | ICD-10-CM | POA: Insufficient documentation

## 2011-02-23 DIAGNOSIS — I10 Essential (primary) hypertension: Secondary | ICD-10-CM | POA: Insufficient documentation

## 2011-02-23 DIAGNOSIS — E119 Type 2 diabetes mellitus without complications: Secondary | ICD-10-CM | POA: Insufficient documentation

## 2011-02-23 HISTORY — DX: Essential (primary) hypertension: I10

## 2011-02-23 LAB — URINALYSIS, ROUTINE W REFLEX MICROSCOPIC
Glucose, UA: NEGATIVE mg/dL
Nitrite: NEGATIVE
Specific Gravity, Urine: 1.02 (ref 1.005–1.030)
pH: 5 (ref 5.0–8.0)

## 2011-02-23 LAB — URINE MICROSCOPIC-ADD ON

## 2011-02-23 MED ORDER — ONDANSETRON HCL 4 MG PO TABS: 4.0000 mg | ORAL_TABLET | Freq: Four times a day (QID) | ORAL | Status: AC

## 2011-02-23 MED ORDER — ONDANSETRON 8 MG PO TBDP
ORAL_TABLET | ORAL | Status: AC
  Filled 2011-02-23: qty 1

## 2011-02-23 MED ORDER — KETOROLAC TROMETHAMINE 60 MG/2ML IM SOLN: 60.0000 mg | Freq: Once | INTRAMUSCULAR | Status: DC

## 2011-02-23 MED ORDER — KETOROLAC TROMETHAMINE 30 MG/ML IJ SOLN
INTRAMUSCULAR | Status: AC
  Administered 2011-02-23: 30 mg
  Filled 2011-02-23: qty 2

## 2011-02-23 MED ORDER — DEXTROSE 5 % IV SOLN
1.0000 g | Freq: Once | INTRAVENOUS | Status: AC
  Administered 2011-02-23: 1 g via INTRAVENOUS
  Filled 2011-02-23: qty 10

## 2011-02-23 MED ORDER — CEPHALEXIN 500 MG PO CAPS: 500.0000 mg | ORAL_CAPSULE | Freq: Four times a day (QID) | ORAL | Status: AC

## 2011-02-23 MED ORDER — HYDROMORPHONE HCL PF 2 MG/ML IJ SOLN
2.0000 mg | Freq: Once | INTRAMUSCULAR | Status: AC
  Administered 2011-02-23: 2 mg via INTRAMUSCULAR
  Filled 2011-02-23: qty 1

## 2011-02-23 MED ORDER — KETOROLAC TROMETHAMINE 30 MG/ML IJ SOLN: 30.0000 mg | Freq: Once | INTRAMUSCULAR | Status: DC

## 2011-02-23 MED ORDER — OXYCODONE-ACETAMINOPHEN 5-325 MG PO TABS: 2.0000 | ORAL_TABLET | ORAL | Status: AC | PRN

## 2011-02-23 MED ORDER — ONDANSETRON HCL 4 MG/2ML IJ SOLN
4.0000 mg | Freq: Once | INTRAMUSCULAR | Status: AC
  Administered 2011-02-23: 4 mg via INTRAVENOUS
  Filled 2011-02-23: qty 2

## 2011-02-23 MED ORDER — ONDANSETRON 8 MG PO TBDP
8.0000 mg | ORAL_TABLET | Freq: Once | ORAL | Status: AC
  Administered 2011-02-23: 8 mg via ORAL
  Filled 2011-02-23: qty 1

## 2011-02-23 MED ORDER — ONDANSETRON 8 MG PO TBDP: 8.0000 mg | ORAL_TABLET | Freq: Once | ORAL | Status: DC

## 2011-02-23 NOTE — ED Provider Notes (Signed)
History    this is a 60 year old female with history of prior kidney stones presenting to the ED with an acute onset of left CVA tenderness which started this morning. Patient described the pain as sharp, throbbing, and unrelief with positional changes. Patient states that it felt very similar to her prior kidney stones. Her last stones was 3 years ago. She denies prior stenting procedure. She denies urinary incontinence, retention, burning on urination, recent trauma, rash, chest pain, shortness of breath, or abdominal pain.  CSN: [id number] Arrival date & time: 02/23/2011 10:58 AM   First MD Initiated Contact with Patient 02/23/11 1219      Chief Complaint  Patient presents with  . Nephrolithiasis    (Consider location/radiation/quality/duration/timing/severity/associated sxs/prior treatment) The history is provided by the patient. No language interpreter was used.    Past Medical History  Diagnosis Date  . Kidney stone   . Diabetes mellitus   . Hypertension     Past Surgical History  Procedure Date  . Total knee arthroplasty     No family history on file.  History  Substance Use Topics  . Smoking status: Never Smoker   . Smokeless tobacco: Not on file  . Alcohol Use: No    OB History    Grav Para Term Preterm Abortions TAB SAB Ect Mult Living                  Review of Systems  All other systems reviewed and are negative.    Allergies  Actos and Tetanus toxoids  Home Medications   Current Outpatient Rx  Name Route Sig Dispense Refill  . AMLODIPINE BESYLATE 5 MG PO TABS Oral Take 5 mg by mouth daily.      [name] VITAMIN D 1000 UNITS PO TABS Oral Take 1,000 Units by mouth daily.      . INSULIN GLARGINE 100 UNIT/ML [place] SOLN Subcutaneous Inject 40 Units into the skin at bedtime.      [name] LEVOTHYROXINE SODIUM 125 MCG PO TABS Oral Take 125 mcg by mouth daily.      [name] LIRAGLUTIDE 18 MG/3ML [place] SOLN Subcutaneous Inject 1.8 mLs into the skin every morning.      [name]  METFORMIN HCL 500 MG PO TABS Oral Take 500-1,000 mg by mouth. PT TAKES 2 TABS IN THE MORNING,1 TAB AT LUNCH,2 TABS AT BEDTIME     . METHOCARBAMOL 500 MG PO TABS Oral Take 500 mg by mouth daily as needed. SPASMS     . OLMESARTAN MEDOXOMIL-HCTZ 20-12.5 MG PO TABS Oral Take 1 tablet by mouth daily.      [name] OMEPRAZOLE 20 MG PO CPDR Oral Take 20 mg by mouth daily.      . SERTRALINE HCL 100 MG PO TABS Oral Take 100 mg by mouth daily.      . SERTRALINE HCL 25 MG PO TABS Oral Take 25 mg by mouth daily.      [name] SIMVASTATIN 20 MG PO TABS Oral Take 20 mg by mouth at bedtime.        BP 173/84  Pulse 83  Temp(Src) 98.4 F (36.9 C) (Oral)  Resp 20  SpO2 100%  Physical Exam  Nursing note and vitals reviewed. Constitutional:       Awake, alert, nontoxic appearance  HENT:  Head: Atraumatic.  Eyes: Right eye exhibits no discharge. Left eye exhibits no discharge.  Neck: Neck supple.  Pulmonary/Chest: Effort normal. She exhibits no tenderness.  Abdominal: There is no tenderness. There is  CVA tenderness. There is no rebound.  Musculoskeletal: She exhibits no tenderness.       Baseline ROM, no obvious new focal weakness  Neurological:       Mental status and motor strength appears baseline for patient and situation  Skin: No rash noted.  Psychiatric: She has a normal mood and affect.    ED Course  Procedures (including critical care time)  Labs Reviewed - No data to display No results found.   No diagnosis found.  Results for orders placed during the hospital encounter of 02/23/11  URINALYSIS, ROUTINE W REFLEX MICROSCOPIC      Component Value Range   Color, Urine YELLOW  YELLOW    APPearance CLOUDY (*) CLEAR    Specific Gravity, Urine 1.020  1.005 - 1.030    pH 5.0  5.0 - 8.0    Glucose, UA NEGATIVE  NEGATIVE (mg/dL)   Hgb urine dipstick MODERATE (*) NEGATIVE    Bilirubin Urine NEGATIVE  NEGATIVE    Ketones, ur NEGATIVE  NEGATIVE (mg/dL)   Protein, ur NEGATIVE  NEGATIVE (mg/dL)    Urobilinogen, UA 0.2  0.0 - 1.0 (mg/dL)   Nitrite NEGATIVE  NEGATIVE    Leukocytes, UA LARGE (*) NEGATIVE   URINE MICROSCOPIC-ADD ON      Component Value Range   Squamous Epithelial / LPF MANY (*) RARE    WBC, UA 21-50  <3 (WBC/hpf)   RBC / HPF 3-6  <3 (RBC/hpf)   Bacteria, UA MANY (*) RARE    Urine-Other MUCOUS PRESENT     Ct Abdomen Pelvis Wo Contrast  02/23/2011  *RADIOLOGY REPORT*  Clinical Data: Left costovertebral angle tenderness.  History of kidney stone.  Left flank pain.  CT ABDOMEN AND PELVIS WITHOUT CONTRAST  Technique:  Multidetector CT imaging of the abdomen and pelvis was performed following the standard protocol without intravenous contrast.  Comparison: Radiography 04/08/2009  Findings: There is mild linear scarring at the lung bases.  No pleural or pericardial fluid.  The liver has a normal appearance without contrast.  No focal lesions.  No calcified gallstones.  The spleen is normal.  The pancreas is normal.  The adrenal glands are normal.  The right kidney contains numerous (five to seven) punctate calculi without evidence of obstruction.  No cyst or mass.  The left kidney contains numerous punctate stones, also five to seven and number. There is left hydroureteronephrosis of a moderate degree with the ureter being dilated all the way to the UVJ where there is a 3 mm stone.  No evidence of renal cyst or mass.  The aorta shows atherosclerosis but no aneurysm.  The IVC is unremarkable.  No retroperitoneal mass or adenopathy.  No free intraperitoneal fluid or air.  No primary bowel pathology.  There is probably a dominant uterine leiomyoma, measuring 6 cm.  There is ordinary degenerative disease in the lower lumbar spine.  IMPRESSION: Hydroureteronephrosis on the left with the ureter being dilated all the way to the UVJ where there is a 3 mm stone.  Numerous punctate renal calculi bilaterally, nonobstructive.  Uterine mass most consistent with leiomyoma.  Original Report Authenticated  By: Thomasenia Sales, [name]      MDM  Left CVA tenderness and prior history of kidney stones. Will obtain a UA and for further evaluation  2:28 PM CT abd shows hydroureteronephrosis on the L ureter with an obstructed 3mm stone.  UA also shows evidence of UTI.  Rocephin given.  Pain management.  Pt able  to tolerates PO at this time ,but feel very nauseate.  Will discharge once able to tolerates PO   3:06 PM Pt able to tolerates PO.  Will discharge, with instruction to f/u with urology.  Pt voice understanding.  She is currently in NAD.    Fayrene Helper, PA 02/23/11 [phone]

## 2011-02-23 NOTE — ED Provider Notes (Signed)
Medical screening examination/treatment/procedure(s) were conducted as a shared visit with non-physician practitioner(s) and myself.  I personally evaluated the patient during the encounter  Pain controlled on my exam. No abd ttp. No back ttp. Renal stone by CT. Tolerating PO. Home with pain control, urology f/u. Has strainer   Forbes Cellar, MD 02/23/11 1510

## 2011-02-23 NOTE — ED Notes (Signed)
C/o left flank pain, nausea and vomitting. Feels just like a previous kidney stone.

## 2011-02-23 NOTE — ED Notes (Signed)
Pt given discharge info, amb indep to discharge window.

## 2011-02-23 NOTE — ED Notes (Signed)
Pt with difficulty urinating and extreme pain

## 2011-02-24 LAB — URINE CULTURE: Culture  Setup Time: 201212122233

## 2011-04-13 ENCOUNTER — Encounter (HOSPITAL_BASED_OUTPATIENT_CLINIC_OR_DEPARTMENT_OTHER): Payer: Self-pay | Admitting: *Deleted

## 2011-04-13 NOTE — Progress Notes (Signed)
NPO AFTER MN. ARRIVES AT 0800. NEEDS ISTAT AND EKG. WILL TAKE SYNTHROID AND PRILOSEC AM OF SURG. W/ SIP OF WATER . IF NEEDED WILL TAKE VICODIN.

## 2011-04-20 ENCOUNTER — Ambulatory Visit (HOSPITAL_BASED_OUTPATIENT_CLINIC_OR_DEPARTMENT_OTHER): Payer: 59 | Admitting: Certified Registered"

## 2011-04-20 ENCOUNTER — Encounter (HOSPITAL_BASED_OUTPATIENT_CLINIC_OR_DEPARTMENT_OTHER): Payer: Self-pay | Admitting: *Deleted

## 2011-04-20 ENCOUNTER — Encounter (HOSPITAL_BASED_OUTPATIENT_CLINIC_OR_DEPARTMENT_OTHER): Payer: Self-pay | Admitting: Certified Registered"

## 2011-04-20 ENCOUNTER — Other Ambulatory Visit: Payer: Self-pay

## 2011-04-20 ENCOUNTER — Ambulatory Visit (HOSPITAL_BASED_OUTPATIENT_CLINIC_OR_DEPARTMENT_OTHER)
Admission: RE | Admit: 2011-04-20 | Discharge: 2011-04-20 | Disposition: A | Discharge status: Home / Self Care | Payer: 59 | Source: Ambulatory Visit | Attending: Orthopedic Surgery | Admitting: Orthopedic Surgery

## 2011-04-20 ENCOUNTER — Encounter (HOSPITAL_BASED_OUTPATIENT_CLINIC_OR_DEPARTMENT_OTHER)
Admission: RE | Disposition: A | Discharge status: Home / Self Care | Payer: Self-pay | Source: Ambulatory Visit | Attending: Orthopedic Surgery

## 2011-04-20 DIAGNOSIS — Z79899 Other long term (current) drug therapy: Secondary | ICD-10-CM | POA: Insufficient documentation

## 2011-04-20 DIAGNOSIS — I10 Essential (primary) hypertension: Secondary | ICD-10-CM | POA: Insufficient documentation

## 2011-04-20 DIAGNOSIS — K219 Gastro-esophageal reflux disease without esophagitis: Secondary | ICD-10-CM | POA: Insufficient documentation

## 2011-04-20 DIAGNOSIS — M6580 Other synovitis and tenosynovitis, unspecified site: Secondary | ICD-10-CM | POA: Insufficient documentation

## 2011-04-20 DIAGNOSIS — E119 Type 2 diabetes mellitus without complications: Secondary | ICD-10-CM | POA: Insufficient documentation

## 2011-04-20 DIAGNOSIS — M25569 Pain in unspecified knee: Secondary | ICD-10-CM | POA: Insufficient documentation

## 2011-04-20 DIAGNOSIS — M12269 Villonodular synovitis (pigmented), unspecified knee: Secondary | ICD-10-CM | POA: Diagnosis present

## 2011-04-20 DIAGNOSIS — R29898 Other symptoms and signs involving the musculoskeletal system: Secondary | ICD-10-CM | POA: Insufficient documentation

## 2011-04-20 DIAGNOSIS — E039 Hypothyroidism, unspecified: Secondary | ICD-10-CM | POA: Insufficient documentation

## 2011-04-20 DIAGNOSIS — E785 Hyperlipidemia, unspecified: Secondary | ICD-10-CM | POA: Insufficient documentation

## 2011-04-20 DIAGNOSIS — Z96659 Presence of unspecified artificial knee joint: Secondary | ICD-10-CM | POA: Insufficient documentation

## 2011-04-20 HISTORY — DX: Dependence on other enabling machines and devices: Z99.89

## 2011-04-20 HISTORY — DX: Personal history of urinary calculi: Z87.442

## 2011-04-20 HISTORY — PX: KNEE ARTHROSCOPY: SHX127

## 2011-04-20 HISTORY — DX: Anesthesia of skin: R20.0

## 2011-04-20 HISTORY — DX: Injury of unspecified nerves of neck, initial encounter: S14.9XXA

## 2011-04-20 HISTORY — DX: Hypothyroidism, unspecified: E03.9

## 2011-04-20 HISTORY — DX: Synovitis and tenosynovitis, unspecified: M65.9

## 2011-04-20 HISTORY — DX: Pain in right knee: M25.561

## 2011-04-20 HISTORY — DX: Effusion, right knee: M25.461

## 2011-04-20 HISTORY — DX: Unspecified osteoarthritis, unspecified site: M19.90

## 2011-04-20 HISTORY — DX: Hyperlipidemia, unspecified: E78.5

## 2011-04-20 HISTORY — DX: Other specified postprocedural states: Z98.890

## 2011-04-20 HISTORY — DX: Unspecified synovitis and tenosynovitis, unspecified lower leg: M65.969

## 2011-04-20 HISTORY — DX: Other specified postprocedural states: R11.2

## 2011-04-20 HISTORY — DX: Mononeuropathy, unspecified: G58.9

## 2011-04-20 HISTORY — DX: Obstructive sleep apnea (adult) (pediatric): G47.33

## 2011-04-20 HISTORY — DX: Gastro-esophageal reflux disease without esophagitis: K21.9

## 2011-04-20 LAB — POCT I-STAT 4, (NA,K, GLUC, HGB,HCT)
Glucose, Bld: 82 mg/dL (ref 70–99)
HCT: 37 % (ref 36.0–46.0)
Sodium: 143 mEq/L (ref 135–145)

## 2011-04-20 SURGERY — ARTHROSCOPY, KNEE
Anesthesia: General | Site: Knee | Laterality: Right | Wound class: Clean

## 2011-04-20 MED ORDER — SODIUM CHLORIDE 0.9 % IV SOLN: INTRAVENOUS | Status: DC

## 2011-04-20 MED ORDER — SODIUM CHLORIDE 0.9 % IR SOLN
Status: DC | PRN
  Administered 2011-04-20: 6000 mL

## 2011-04-20 MED ORDER — FENTANYL CITRATE 0.05 MG/ML IJ SOLN
INTRAMUSCULAR | Status: DC | PRN
  Administered 2011-04-20: 25 ug via INTRAVENOUS
  Administered 2011-04-20: 50 ug via INTRAVENOUS

## 2011-04-20 MED ORDER — DROPERIDOL 2.5 MG/ML IJ SOLN
INTRAMUSCULAR | Status: DC | PRN
  Administered 2011-04-20: 0.625 mg via INTRAVENOUS

## 2011-04-20 MED ORDER — EPHEDRINE SULFATE 50 MG/ML IJ SOLN
INTRAMUSCULAR | Status: DC | PRN
  Administered 2011-04-20 (×3): 10 mg via INTRAVENOUS

## 2011-04-20 MED ORDER — OXYCODONE-ACETAMINOPHEN 5-325 MG PO TABS: 1.0000 | ORAL_TABLET | ORAL | Status: AC | PRN

## 2011-04-20 MED ORDER — MIDAZOLAM HCL 5 MG/5ML IJ SOLN
INTRAMUSCULAR | Status: DC | PRN
  Administered 2011-04-20: 2 mg via INTRAVENOUS

## 2011-04-20 MED ORDER — PROPOFOL 10 MG/ML IV EMUL
INTRAVENOUS | Status: DC | PRN
  Administered 2011-04-20: 200 mg via INTRAVENOUS

## 2011-04-20 MED ORDER — DEXAMETHASONE SODIUM PHOSPHATE 4 MG/ML IJ SOLN
INTRAMUSCULAR | Status: DC | PRN
  Administered 2011-04-20: 4 mg via INTRAVENOUS

## 2011-04-20 MED ORDER — METHOCARBAMOL 500 MG PO TABS: 500.0000 mg | ORAL_TABLET | Freq: Four times a day (QID) | ORAL | Status: AC

## 2011-04-20 MED ORDER — CEFAZOLIN SODIUM-DEXTROSE 2-3 GM-% IV SOLR
2.0000 g | Freq: Once | INTRAVENOUS | Status: AC
  Administered 2011-04-20: 2 g via INTRAVENOUS

## 2011-04-20 MED ORDER — OXYCODONE-ACETAMINOPHEN 5-325 MG PO TABS
1.0000 | ORAL_TABLET | ORAL | Status: DC | PRN
  Administered 2011-04-20: 1 via ORAL

## 2011-04-20 MED ORDER — ONDANSETRON HCL 4 MG/2ML IJ SOLN
INTRAMUSCULAR | Status: DC | PRN
  Administered 2011-04-20: 4 mg via INTRAVENOUS

## 2011-04-20 MED ORDER — BUPIVACAINE-EPINEPHRINE 0.25% -1:200000 IJ SOLN
INTRAMUSCULAR | Status: DC | PRN
  Administered 2011-04-20: 20 mL

## 2011-04-20 MED ORDER — CHLORHEXIDINE GLUCONATE 4 % EX LIQD: 60.0000 mL | Freq: Once | CUTANEOUS | Status: DC

## 2011-04-20 MED ORDER — LACTATED RINGERS IV SOLN
INTRAVENOUS | Status: DC
  Administered 2011-04-20: 125 mL/h via INTRAVENOUS

## 2011-04-20 MED ORDER — LACTATED RINGERS IV SOLN
INTRAVENOUS | Status: DC
  Administered 2011-04-20 (×2): via INTRAVENOUS

## 2011-04-20 MED ORDER — FENTANYL CITRATE 0.05 MG/ML IJ SOLN
25.0000 ug | INTRAMUSCULAR | Status: DC | PRN
  Administered 2011-04-20: 50 ug via INTRAVENOUS

## 2011-04-20 MED ORDER — MEPERIDINE HCL 25 MG/ML IJ SOLN: 6.2500 mg | INTRAMUSCULAR | Status: DC | PRN

## 2011-04-20 MED ORDER — PROMETHAZINE HCL 25 MG/ML IJ SOLN: 6.2500 mg | INTRAMUSCULAR | Status: DC | PRN

## 2011-04-20 MED ORDER — LIDOCAINE HCL (CARDIAC) 20 MG/ML IV SOLN
INTRAVENOUS | Status: DC | PRN
  Administered 2011-04-20: 100 mg via INTRAVENOUS

## 2011-04-20 SURGICAL SUPPLY — 36 items
BANDAGE ELASTIC 6 VELCRO ST LF (GAUZE/BANDAGES/DRESSINGS) ×2 IMPLANT
BLADE 4.2CUDA (BLADE) ×2 IMPLANT
BLADE CUDA SHAVER 3.5 (BLADE) IMPLANT
BLADE CUTTER GATOR 3.5 (BLADE) IMPLANT
CANISTER SUCT LVC 12 LTR MEDI- (MISCELLANEOUS) ×2 IMPLANT
CANISTER SUCTION 2500CC (MISCELLANEOUS) IMPLANT
CLOTH BEACON ORANGE TIMEOUT ST (SAFETY) ×2 IMPLANT
DRAPE ARTHROSCOPY W/POUCH 114 (DRAPES) ×2 IMPLANT
DRSG EMULSION OIL 3X3 NADH (GAUZE/BANDAGES/DRESSINGS) ×2 IMPLANT
DRSG PAD ABDOMINAL 8X10 ST (GAUZE/BANDAGES/DRESSINGS) ×2 IMPLANT
DURAPREP 26ML APPLICATOR (WOUND CARE) ×2 IMPLANT
ELECT MENISCUS 165MM 90D (ELECTRODE) IMPLANT
ELECT REM PT RETURN 9FT ADLT (ELECTROSURGICAL)
ELECTRODE REM PT RTRN 9FT ADLT (ELECTROSURGICAL) IMPLANT
GLOVE BIO SURGEON STRL SZ8 (GLOVE) ×2 IMPLANT
GLOVE BIOGEL PI IND STRL 6.5 (GLOVE) ×1 IMPLANT
GLOVE BIOGEL PI INDICATOR 6.5 (GLOVE) ×1
GLOVE ECLIPSE 6.5 STRL STRAW (GLOVE) ×2 IMPLANT
GLOVE INDICATOR 8.0 STRL GRN (GLOVE) ×2 IMPLANT
GOWN PREVENTION PLUS LG XLONG (DISPOSABLE) ×4 IMPLANT
IV NS IRRIG 3000ML ARTHROMATIC (IV SOLUTION) ×4 IMPLANT
KNEE WRAP E Z 3 GEL PACK (MISCELLANEOUS) ×2 IMPLANT
PACK ARTHROSCOPY DSU (CUSTOM PROCEDURE TRAY) ×2 IMPLANT
PACK BASIN DAY SURGERY FS (CUSTOM PROCEDURE TRAY) ×2 IMPLANT
PADDING CAST ABS 4INX4YD NS (CAST SUPPLIES) ×1
PADDING CAST ABS COTTON 4X4 ST (CAST SUPPLIES) ×1 IMPLANT
PADDING CAST COTTON 6X4 STRL (CAST SUPPLIES) ×2 IMPLANT
PENCIL BUTTON HOLSTER BLD 10FT (ELECTRODE) IMPLANT
SET ARTHROSCOPY TUBING (MISCELLANEOUS) ×1
SET ARTHROSCOPY TUBING LN (MISCELLANEOUS) ×1 IMPLANT
SPONGE GAUZE 4X4 12PLY (GAUZE/BANDAGES/DRESSINGS) ×2 IMPLANT
SUT ETHILON 4 0 PS 2 18 (SUTURE) ×2 IMPLANT
TOWEL OR 17X24 6PK STRL BLUE (TOWEL DISPOSABLE) ×2 IMPLANT
WAND 30 DEG SABER W/CORD (SURGICAL WAND) IMPLANT
WAND 90 DEG TURBOVAC W/CORD (SURGICAL WAND) ×2 IMPLANT
WATER STERILE IRR 500ML POUR (IV SOLUTION) ×2 IMPLANT

## 2011-04-20 NOTE — Progress Notes (Signed)
Glasses returned to patient.

## 2011-04-20 NOTE — Brief Op Note (Signed)
04/20/2011  10:45 AM  PATIENT:  [name]  61 y.o. female  PRE-OPERATIVE DIAGNOSIS:  RIGHT KNEE SYNOVITIS  POST-OPERATIVE DIAGNOSIS:  RIGHT KNEE SYNOVITIS  PROCEDURE:  Procedure(s): ARTHROSCOPY KNEE-Right- with synovectomy  SURGEON:  Surgeon(s): Loanne Drilling, MD  PHYSICIAN ASSISTANT:   ASSISTANTS: none   ANESTHESIA:   general  EBL:  Total I/O In: 200 [I.V.:200] Out: -    DICTATION: .Other Dictation: Dictation Number [id number]  PLAN OF CARE: Discharge to home after PACU  PATIENT DISPOSITION:  PACU - hemodynamically stable.

## 2011-04-20 NOTE — H&P (Signed)
CC- [name] is a 61 y.o. female who presents with right knee pain.  HPI- . Knee Pain: Patient presents with knee swelling involving the  right knee. Onset of the symptoms was several months ago. Inciting event: none known. Current symptoms include crepitus sensation. Pain is aggravated by going up and down stairs and rising after sitting.  Patient has had prior knee problems. Evaluation to date: plain films: normal. Treatment to date: PT which was not very effective.  Past Medical History  Diagnosis Date  . Hypertension   . Arthritis   . History of kidney stones   . 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 Pinched nerve in neck   . Knee pain, right   . Swelling of knee joint, right   . Hyperlipidemia   . GERD (gastroesophageal reflux disease)   . Hypothyroidism   . Diabetes mellitus ORAL MEDS  . PONV (postoperative nausea and vomiting)   . Synovitis of knee RIGHT  . OSA on CPAP     Past Surgical History  Procedure Date  . Tonsillectomy and adenoidectomy child  . Total knee arthroplasty 12-14-2009    RIGHT  . Pulley release left long finger 07-14-2009  . Left carpal tunnel / left middle & ring finger trigger release 08-26-2008  . Right carpal tunnel/ right thumb trigger release's 11-28-2006  . Shoulder arthroscopy w/ acromial repair 11-29-2005    LEFT  . Left shoulder arthroscopy/ left thumb trigger release 02-22-2005  . Shoulder arthroscopy distal clavicle excision and open rotator cuff repair 09-07-2004    LEFT  . Right shoulder arthroscopy w/ rotator cuff repair 01-13-2004  . Left shoulder arthroscopy w/ debridement 09-09-2003  . Cesarean section X2    Prior to Admission medications   Medication Sig Start Date End Date Taking? Authorizing Provider  amLODipine (NORVASC) 5 MG tablet Take 5 mg by mouth every evening.    Yes Historical Provider, MD  cholecalciferol (VITAMIN D) 1000 UNITS tablet Take 1,000 Units by mouth daily.    Yes Historical Provider, MD    gabapentin (NEURONTIN) 300 MG capsule Take 300 mg by mouth 3 (three) times daily.   Yes Historical Provider, MD  HYDROcodone-acetaminophen (NORCO) 5-325 MG per tablet Take 1 tablet by mouth every 6 (six) hours as needed.   Yes Historical Provider, MD  insulin glargine (LANTUS) 100 UNIT/ML injection Inject 40 Units into the skin at bedtime.    Yes Historical Provider, MD  levothyroxine (LEVOXYL) 125 MCG tablet Take 125 mcg by mouth daily.    Yes Historical Provider, MD  Liraglutide (VICTOZA) 18 MG/3ML SOLN Inject 1.8 mLs into the skin every morning.    Yes Historical Provider, MD  metFORMIN (GLUCOPHAGE) 500 MG tablet Take 500 mg by mouth. PT TAKES 2 TABS IN THE MORNING,1 TAB AT LUNCH,2 TABS AT BEDTIME   Yes Historical Provider, MD  methocarbamol (ROBAXIN) 500 MG tablet Take 500 mg by mouth daily as needed. SPASMS   Yes Historical Provider, MD  omeprazole (PRILOSEC) 20 MG capsule Take 20 mg by mouth every morning.    Yes Historical Provider, MD  sertraline (ZOLOFT) 100 MG tablet Take 100 mg by mouth daily.    Yes Historical Provider, MD  simvastatin (ZOCOR) 20 MG tablet Take 20 mg by mouth at bedtime.    Yes Historical Provider, MD  valsartan-hydrochlorothiazide (DIOVAN-HCT) 320-12.5 MG per tablet Take 1 tablet by mouth daily.   Yes Historical Provider, MD   KNEE EXAM collateral ligaments intact, Moderate crepitus  on range of motion  Physical Examination: General appearance - alert, well appearing, and in no distress Mental status - alert, oriented to person, place, and time Chest - clear to auscultation, no wheezes, rales or rhonchi, symmetric air entry Heart - normal rate, regular rhythm, normal S1, S2, no murmurs, rubs, clicks or gallops Abdomen - soft, nontender, nondistended, no masses or organomegaly Neurological - alert, oriented, normal speech, no focal findings or movement disorder noted   Asessment/Plan--- Right knee hypertrophic synovitis- - Plan right knee arthroscopy with  synovectomy. Procedure risks and potential comps discussed with patient who elects to proceed. Goals are decreased pain and increased function with a high likelihood of achieving both

## 2011-04-20 NOTE — Interval H&P Note (Signed)
History and Physical Interval Note:  04/20/2011 10:08 AM  [name]  has presented today for surgery, with the diagnosis of RIGHT KNEE SYNOVITIS  The various methods of treatment have been discussed with the patient and family. After consideration of risks, benefits and other options for treatment, the patient has consented to  Procedure(s): ARTHROSCOPY KNEE as a surgical intervention .  The patients' history has been reviewed, patient examined, no change in status, stable for surgery.  I have reviewed the patients' chart and labs.  Questions were answered to the patient's satisfaction.     Loanne Drilling

## 2011-04-20 NOTE — Anesthesia Procedure Notes (Signed)
Procedure Name: LMA Insertion Date/Time: 04/20/2011 10:13 AM Performed by: Renella Cunas D Pre-anesthesia Checklist: Patient identified, Emergency Drugs available, Suction available and Patient being monitored Patient Re-evaluated:Patient Re-evaluated prior to inductionOxygen Delivery Method: Circle System Utilized Preoxygenation: Pre-oxygenation with 100% oxygen Intubation Type: IV induction Ventilation: Mask ventilation without difficulty LMA: LMA inserted LMA Size: 4.0 Number of attempts: 1 Airway Equipment and Method: bite block Placement Confirmation: positive ETCO2 Tube secured with: Tape Dental Injury: Teeth and Oropharynx as per pre-operative assessment

## 2011-04-20 NOTE — Anesthesia Preprocedure Evaluation (Addendum)
Anesthesia Evaluation  Patient identified by MRN, date of birth, ID band Patient awake    Reviewed: Allergy & Precautions, H&P , NPO status , Patient's Chart, lab work & pertinent test results  History of Anesthesia Complications (+) PONV  Airway Mallampati: II TM Distance: >3 FB Neck ROM: Full    Dental No notable dental hx. (+) Caps   Pulmonary neg pulmonary ROS, sleep apnea and Continuous Positive Airway Pressure Ventilation ,  clear to auscultation  Pulmonary exam normal       Cardiovascular hypertension, Pt. on medications neg cardio ROS Regular Normal    Neuro/Psych Negative Neurological ROS  Negative Psych ROS   GI/Hepatic negative GI ROS, Neg liver ROS, GERD-  Medicated and Controlled,  Endo/Other  Negative Endocrine ROSDiabetes mellitus-, Oral Hypoglycemic AgentsHypothyroidism Morbid obesity  Renal/GU negative Renal ROS  Genitourinary negative   Musculoskeletal negative musculoskeletal ROS (+)   Abdominal   Peds negative pediatric ROS (+)  Hematology negative hematology ROS (+)   Anesthesia Other Findings   Reproductive/Obstetrics negative OB ROS                          Anesthesia Physical Anesthesia Plan  ASA: III  Anesthesia Plan: General   Post-op Pain Management:    Induction: Intravenous  Airway Management Planned:   Additional Equipment:   Intra-op Plan:   Post-operative Plan: Extubation in OR  Informed Consent: I have reviewed the patients History and Physical, chart, labs and discussed the procedure including the risks, benefits and alternatives for the proposed anesthesia with the patient or authorized representative who has indicated his/her understanding and acceptance.   Dental advisory given  Plan Discussed with: CRNA  Anesthesia Plan Comments:         Anesthesia Quick Evaluation

## 2011-04-20 NOTE — Anesthesia Postprocedure Evaluation (Signed)
  Anesthesia Post-op Note  Patient: [name]  Procedure(s) Performed:  ARTHROSCOPY KNEE - WITH SYNOVECTOMY  Patient Location: PACU  Anesthesia Type: General  Level of Consciousness: awake and alert   Airway and Oxygen Therapy: Patient Spontanous Breathing  Post-op Pain: mild  Post-op Assessment: Post-op Vital signs reviewed, Patient's Cardiovascular Status Stable, Respiratory Function Stable, Patent Airway and No signs of Nausea or vomiting  Post-op Vital Signs: stable  Complications: No apparent anesthesia complications

## 2011-04-20 NOTE — Transfer of Care (Signed)
Immediate Anesthesia Transfer of Care Note  Patient: [name]  Procedure(s) Performed:  ARTHROSCOPY KNEE - WITH SYNOVECTOMY  Patient Location: PACU  Anesthesia Type: General  Level of Consciousness: awake, oriented, sedated and patient cooperative  Airway & Oxygen Therapy: Patient Spontanous Breathing and Patient connected to face mask oxygen  Post-op Assessment: Report given to PACU RN and Post -op Vital signs reviewed and stable  Post vital signs: Reviewed and stable  Complications: No apparent anesthesia complications

## 2011-04-21 ENCOUNTER — Encounter (HOSPITAL_BASED_OUTPATIENT_CLINIC_OR_DEPARTMENT_OTHER): Payer: Self-pay | Admitting: Orthopedic Surgery

## 2011-04-21 NOTE — Op Note (Signed)
NAME:  [name], [name] NO.:  [id number]  MEDICAL RECORD NO.:  [id number]  LOCATION:                                 FACILITY:  PHYSICIAN:  Ollen Gross, [name]    DATE OF BIRTH:  [birth year]  DATE OF PROCEDURE: DATE OF DISCHARGE:                              OPERATIVE REPORT   PREOPERATIVE DIAGNOSIS:  Hypertrophic synovitis, right knee.  POSTOP DIAGNOSIS:  Hypertrophic synovitis, right knee.  PROCEDURE:  Right knee arthroscopy with synovectomy.  SURGEON:  Ollen Gross, [name]  ASSISTANT:  None.  ANESTHESIA:  General.  ESTIMATED BLOOD LOSS:  Minimal.  DRAINS:  None.  COMPLICATION:  None.  CONDITION:  Stable to Recovery.  BRIEF CLINICAL INDICATION:  [name] is a 61 year old female, had a right total knee arthroplasty done over a year ago.  He did well initially, then started to have painful popping.  Nonoperative management did not provide benefit.  She presents now for scope and synovectomy.  PROCEDURE IN DETAIL:  After successful administration of general anesthetic, a tourniquet was placed high on the right thigh, right lower extremity was prepped and draped in the usual sterile fashion.  Standard superomedial/inferolateral incisions were made, inflow cannula passed, superomedial camera passed, inferolateral.  Arthroscopic visualization proceeds.  The junction between the quadriceps tendon and the patella showed a lot of hypertrophic synovial tissue, which was blocking the suprapatellar pouch and potentially getting caught in the box of the femur going from flexion to extension.  This consistent with a patellar clunk lesion.  I created a superolateral portal and debrided this tissue with a combination of the shaver and the ArthroCare device.  I then cleaned out the mediolateral gutters and infrapatellar area.  This effectively removed any tissue that was potentially getting caught and causing a crepitus.  Joints again inspected and no other abnormal  tissue was identified.  The components, all appear to be well fixed in good position.  The arthroscopic equipment was then removed from the inferior portals, which were closed with interrupted 4-0 nylon.  A 20 cc of 0.25% Marcaine with epinephrine injected through the inflow cannula, then that was removed and that portal closed with nylon.  Bulky sterile dressing was then applied, and she was awakened and transported to Recovery in stable condition.     Ollen Gross, [name]     FA/MEDQ  D:  04/20/2011  T:  04/21/2011  Job:  [id number]

## 2011-05-02 ENCOUNTER — Other Ambulatory Visit (HOSPITAL_COMMUNITY): Payer: Self-pay | Admitting: Internal Medicine

## 2011-05-02 DIAGNOSIS — Z1231 Encounter for screening mammogram for malignant neoplasm of breast: Secondary | ICD-10-CM

## 2011-05-04 ENCOUNTER — Ambulatory Visit (HOSPITAL_COMMUNITY)
Admission: RE | Admit: 2011-05-04 | Discharge: 2011-05-04 | Disposition: A | Discharge status: Home / Self Care | Payer: 59 | Source: Ambulatory Visit | Attending: Internal Medicine | Admitting: Internal Medicine

## 2011-05-04 DIAGNOSIS — Z1231 Encounter for screening mammogram for malignant neoplasm of breast: Secondary | ICD-10-CM | POA: Insufficient documentation

## 2011-06-20 ENCOUNTER — Other Ambulatory Visit: Payer: Self-pay | Admitting: Internal Medicine

## 2011-06-20 ENCOUNTER — Other Ambulatory Visit (HOSPITAL_COMMUNITY)
Admission: RE | Admit: 2011-06-20 | Discharge: 2011-06-20 | Disposition: A | Discharge status: Home / Self Care | Payer: 59 | Source: Ambulatory Visit | Attending: Internal Medicine | Admitting: Internal Medicine

## 2011-06-20 DIAGNOSIS — Z01419 Encounter for gynecological examination (general) (routine) without abnormal findings: Secondary | ICD-10-CM | POA: Insufficient documentation

## 2011-06-20 DIAGNOSIS — R87810 Cervical high risk human papillomavirus (HPV) DNA test positive: Secondary | ICD-10-CM | POA: Insufficient documentation

## 2012-03-21 ENCOUNTER — Other Ambulatory Visit (HOSPITAL_COMMUNITY): Payer: Self-pay | Admitting: Orthopedic Surgery

## 2012-03-21 DIAGNOSIS — M25561 Pain in right knee: Secondary | ICD-10-CM

## 2012-04-02 ENCOUNTER — Encounter (HOSPITAL_COMMUNITY)
Admission: RE | Admit: 2012-04-02 | Discharge: 2012-04-02 | Disposition: A | Discharge status: Home / Self Care | Payer: 59 | Source: Ambulatory Visit | Attending: Orthopedic Surgery | Admitting: Orthopedic Surgery

## 2012-04-02 DIAGNOSIS — M1710 Unilateral primary osteoarthritis, unspecified knee: Secondary | ICD-10-CM | POA: Insufficient documentation

## 2012-04-02 DIAGNOSIS — M25569 Pain in unspecified knee: Secondary | ICD-10-CM | POA: Insufficient documentation

## 2012-04-02 DIAGNOSIS — Z96659 Presence of unspecified artificial knee joint: Secondary | ICD-10-CM | POA: Insufficient documentation

## 2012-04-02 DIAGNOSIS — M25561 Pain in right knee: Secondary | ICD-10-CM

## 2012-04-02 MED ORDER — TECHNETIUM TC 99M MEDRONATE IV KIT
25.7000 | PACK | Freq: Once | INTRAVENOUS | Status: AC | PRN
  Administered 2012-04-02: 25.7 via INTRAVENOUS

## 2012-04-28 ENCOUNTER — Other Ambulatory Visit: Payer: Self-pay

## 2012-07-11 ENCOUNTER — Ambulatory Visit (INDEPENDENT_AMBULATORY_CARE_PROVIDER_SITE_OTHER): Payer: Self-pay | Admitting: Family Medicine

## 2012-07-11 DIAGNOSIS — E119 Type 2 diabetes mellitus without complications: Secondary | ICD-10-CM

## 2012-07-11 NOTE — Progress Notes (Signed)
Patient presents for 1 month follow up DM as part of the employee sponsored [employer]. Medications, insulin regimen, and glucose readings have been reviewed. I have also discussed with patient lifestyle interventions such as diet and exercise. Full documentation of this note can be found in the [employer] documenting system through [employer] [facility]). Patient has set a series of personal goals and will follow up in 3 months for further review of DM.

## 2012-07-19 NOTE — Progress Notes (Signed)
Patient ID: [name], female   DOB: 01-29-1951, 62 y.o.   MRN: [id number] ATTENDING PHYSICIAN NOTE: I have reviewed the chart and agree with the plan as detailed above. Denny Levy MD Pager [phone]

## 2012-09-04 ENCOUNTER — Encounter: Payer: 59 | Attending: Internal Medicine | Admitting: *Deleted

## 2012-09-04 ENCOUNTER — Encounter: Payer: Self-pay | Admitting: *Deleted

## 2012-09-04 VITALS — Ht 62.0 in | Wt 241.9 lb

## 2012-09-04 DIAGNOSIS — E119 Type 2 diabetes mellitus without complications: Secondary | ICD-10-CM

## 2012-09-04 DIAGNOSIS — E669 Obesity, unspecified: Secondary | ICD-10-CM | POA: Insufficient documentation

## 2012-09-04 DIAGNOSIS — Z713 Dietary counseling and surveillance: Secondary | ICD-10-CM | POA: Insufficient documentation

## 2012-09-04 NOTE — Progress Notes (Signed)
  Medical Nutrition Therapy:  Appt start time: 1500 end time:  1600.   Assessment:  Primary concerns today: [name] is here for nutrition counseling.  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  She feels like dinner is her hard time.  She gets off work at [employer] and doesn't feel like cooking in the evenings.  She is tired and goes to bed early.  She doesn't like to eat leftovers so it's hard to cook each night for herself. She doesn't feel satisfied at dinner and then overeats via frequent snacks at night. HbA1c 6.3% in May.  Monitors glucose BID.  Fasting am: 75-110 mg/dl; fasting pm: 952-841 after working out; sometimes checks at night: 160-190.  Occasionally it's over 200 mg/dl.  She tries to eat dinner early, but sometimes she can't.  Tries to be in bed by 7:30/9 pm  MEDICATIONS: see list   DIETARY INTAKE:  Usual eating pattern includes 3 meals and multiple snacks per day.  Everyday foods include proteins, fruit, vegetable, and multiple starches.  Avoided foods include rice and potatoes, pasta.    24-hr recall:  B ( AM): hard boiled egg and maybe grits with S/P.  Might have English Muffin  Snk ( AM): not usually.  Might have some nuts or Skinny pop popcorn  L ( PM): sandwich on thin rye bread with [place] and maybe L/T.  Might bring carrots or celery.  Might bring fruitOccasionally bag of chips Snk ( PM): not usually D ( PM): protein (ususally chicken) with vegetable (large portion) Snk ( PM): cheese and crackers.   Beverages: water  Usual physical activity: swim, water aerobics for 1 hour.  Has knee replacement and can't do much   Estimated energy needs: 1600 calories 180 g carbohydrates 120 g protein 44 g fat  Progress Towards Goal(s):  In progress.   Nutritional Diagnosis:  NB-1.5 Disordered eating pattern As related to calorie restriction during the day and overeating at night.  As evidenced by dietary recall.    Intervention:  Nutrition counseling provided.  [name] eats too little during the  day and thus is more hungry at night.  She also is more tired because she doesn't get enough nutrition during the day.  Encouraged 3 carb choice + protein at each meal and 1 carb choice + protein at each snack.  Suggested crock pot, but [name] isn't comfortable with that.  Discussed non-meat protein options like cheese, eggs, nuts, nut butters; also beans, lentils and milk  Handouts given during visit include:  My Meal Plan care  Food label handout  Monitoring/Evaluation:  Dietary intake, exercise, BGM, and body weight prn.

## 2012-09-04 NOTE — Patient Instructions (Addendum)
Goals:  Follow Diabetes Meal Plan as instructed  Eat 3 meals and 2 snacks, every 3-5 hrs  Limit carbohydrate intake to 45 grams carbohydrate/meal  Limit carbohydrate intake to 15 grams carbohydrate/snack  Add lean protein foods to meals/snacks  Monitor glucose levels as instructed by your doctor  Aim for 60 mins of physical activity most days  Bring food record and glucose log to your next nutrition visit   Before swim snack: spoonful peanut butter with 4 oz apple or cranberry juice.

## 2012-10-19 ENCOUNTER — Encounter (HOSPITAL_COMMUNITY): Payer: Self-pay | Admitting: Pharmacy Technician

## 2012-10-20 ENCOUNTER — Other Ambulatory Visit: Payer: Self-pay | Admitting: Orthopedic Surgery

## 2012-10-20 NOTE — Progress Notes (Signed)
Preoperative surgical orders have been place into the Epic hospital system for [name] on 10/20/2012, 10:13 AM  by Patrica Duel for surgery on 11/05/12.  Preop Total Knee orders including Experal, PO Tylenol, and IV Decadron as long as there are no contraindications to the above medications. Avel Peace, PA-C

## 2012-10-24 ENCOUNTER — Other Ambulatory Visit: Payer: Self-pay | Admitting: Orthopedic Surgery

## 2012-10-29 ENCOUNTER — Ambulatory Visit (HOSPITAL_COMMUNITY)
Admission: RE | Admit: 2012-10-29 | Discharge: 2012-10-29 | Disposition: A | Discharge status: Home / Self Care | Payer: 59 | Source: Ambulatory Visit | Attending: Orthopedic Surgery | Admitting: Orthopedic Surgery

## 2012-10-29 ENCOUNTER — Encounter (HOSPITAL_COMMUNITY): Payer: Self-pay

## 2012-10-29 ENCOUNTER — Encounter (HOSPITAL_COMMUNITY)
Admission: RE | Admit: 2012-10-29 | Discharge: 2012-10-29 | Disposition: A | Discharge status: Home / Self Care | Payer: 59 | Source: Ambulatory Visit | Attending: Orthopedic Surgery | Admitting: Orthopedic Surgery

## 2012-10-29 DIAGNOSIS — E119 Type 2 diabetes mellitus without complications: Secondary | ICD-10-CM | POA: Insufficient documentation

## 2012-10-29 DIAGNOSIS — I10 Essential (primary) hypertension: Secondary | ICD-10-CM | POA: Insufficient documentation

## 2012-10-29 DIAGNOSIS — Z01818 Encounter for other preprocedural examination: Secondary | ICD-10-CM | POA: Insufficient documentation

## 2012-10-29 DIAGNOSIS — M1710 Unilateral primary osteoarthritis, unspecified knee: Secondary | ICD-10-CM | POA: Insufficient documentation

## 2012-10-29 DIAGNOSIS — Z01812 Encounter for preprocedural laboratory examination: Secondary | ICD-10-CM | POA: Insufficient documentation

## 2012-10-29 HISTORY — DX: Depression, unspecified: F32.A

## 2012-10-29 HISTORY — DX: Unspecified osteoarthritis, unspecified site: M19.90

## 2012-10-29 HISTORY — DX: Pneumonia, unspecified organism: J18.9

## 2012-10-29 HISTORY — DX: Anemia, unspecified: D64.9

## 2012-10-29 HISTORY — DX: Major depressive disorder, single episode, unspecified: F32.9

## 2012-10-29 LAB — COMPREHENSIVE METABOLIC PANEL
ALT: 15 U/L (ref 0–35)
Calcium: 10.1 mg/dL (ref 8.4–10.5)
GFR calc Af Amer: 46 mL/min — ABNORMAL LOW (ref >90)
Glucose, Bld: 77 mg/dL (ref 70–99)
Sodium: 135 mEq/L (ref 135–145)
Total Protein: 8.2 g/dL (ref 6.0–8.3)

## 2012-10-29 LAB — PROTIME-INR: Prothrombin Time: 12.6 seconds (ref 11.6–15.2)

## 2012-10-29 LAB — URINALYSIS, ROUTINE W REFLEX MICROSCOPIC
Protein, ur: NEGATIVE mg/dL
Urobilinogen, UA: 1 mg/dL (ref 0.0–1.0)

## 2012-10-29 LAB — CBC
Hemoglobin: 13.5 g/dL (ref 12.0–15.0)
MCH: 30.3 pg (ref 26.0–34.0)
MCHC: 32.1 g/dL (ref 30.0–36.0)
RDW: 14 % (ref 11.5–15.5)

## 2012-10-29 LAB — SURGICAL PCR SCREEN: MRSA, PCR: NEGATIVE

## 2012-10-29 NOTE — Patient Instructions (Addendum)
20 [name]  10/29/2012   Your procedure is scheduled on: 11/05/12  Report to [facility] at 5:15 AM.  Call this number if you have problems the morning of surgery [phone]: [phone]   Remember:   Do not eat food or drink liquids After Midnight.     Take these medicines the morning of surgery with A SIP OF WATER: levothyroxine    Do not wear jewelry, make-up or nail polish.  Do not wear lotions, powders, or perfumes. You may wear deodorant.  Do not shave 48 hours prior to surgery. Men may shave face and neck.  Do not bring valuables to the hospital.  Contacts, dentures or bridgework may not be worn into surgery.  Leave suitcase in the car. After surgery it may be brought to your room.  For patients admitted to the hospital, checkout time is 11:00 AM the day of discharge.   Please read over the following fact sheets that you were given: MRSA Information, incentive spirometry fact sheet, blood fact sheet [name], RN  pre op nurse call if needed [phone]    FAILURE TO FOLLOW THESE INSTRUCTIONS MAY RESULT IN CANCELLATION OF YOUR SURGERY   Patient Signature: ___________________________________________

## 2012-10-29 NOTE — Progress Notes (Signed)
Surgery clearance note 08/04/12 Dr. Earl Gala on chart, EKG 10/04/12 on chart, ABI screening form 10/08/12 on chart

## 2012-10-30 LAB — URINE CULTURE
Colony Count: NO GROWTH
Culture: NO GROWTH

## 2012-10-30 NOTE — Progress Notes (Signed)
Micro, ua results faxed to dr aluisio by epic 

## 2012-10-31 NOTE — Progress Notes (Signed)
Fax received and placed on pt chart cipro called in to pharmacy for pt per drew perkins

## 2012-11-04 ENCOUNTER — Other Ambulatory Visit: Payer: Self-pay | Admitting: Orthopedic Surgery

## 2012-11-04 NOTE — H&P (Signed)
[name]  DOB: 09/04/1950 Single / Language: English / Race: White Female  Chief Complaint:  Left Knee Pain  Date of Admission:  11/05/2012  History of Present Illness The patient is a 61 year old female who comes in for a preoperative History and Physical. The patient is scheduled for a left total knee arthroplasty to be performed by Dr. Frank V. Aluisio, MD at [facility] on 11/05/2012. The patient is being followed for their left knee pain and osteoarthritis. They are out from Synvisc series (that did not provide much relief after the first injection). Symptoms reported today include: pain (that is becoming more constant).  Patient is over 2 years postop following total knee arthroplasty. Overall the patient feels that the right knee is not doing well (still having a lot of pain). The patient does report pain and swelling (stiffness especially at the end of the day). It is likely due to compensation off of the left knee. She still has soreness on the lateral aspect of the right knee. Left knee is bothering her a lot more. She has known arthritis in the left knee. She has had Visco supplements in the past. The knee continues to hurt and she is now ready to get the left knee replaced. They have been treated conservatively in the past for the above stated problem and despite conservative measures, they continue to have progressive pain and severe functional limitations and dysfunction. They have failed non-operative management including home exercise, medications, and injections. It is felt that they would benefit from undergoing total joint replacement. Risks and benefits of the procedure have been discussed with the patient and they elect to proceed with surgery. There are no active contraindications to surgery such as ongoing infection or rapidly progressive neurological disease.  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 mother Osteoarthritis. sister Heart Disease. mother Osteoporosis. mother Cancer. father, grandmother mothers side, grandmother fathers side and grandfather fathers side Congestive Heart Failure. mother Diabetes Mellitus. mother Cerebrovascular Accident. grandmother mothers side and grandmother fathers side   Social History Drug/Alcohol Rehab (Previously). no Current work status. working full time Drug/Alcohol Rehab (Currently). no Exercise. Exercises weekly; does running / walking, individual sport and gym / weights Exercises weekly; does other Illicit drug use. no Living situation. live alone Previously in rehab. no Children. 2 No alcohol use Alcohol use. current drinker; drinks wine; only occasionally per week Tobacco / smoke exposure. no Most recent primary occupation. Admissions Associate Number of flights of stairs before winded. 2-3 Pain Contract. no Marital status. single Tobacco use. Former smoker. former smoker; smoke(d) 1/2 pack(s) per day   Medication History Lantus ( Subcutaneous) Specific dose unknown - Active. Victoza ( Subcutaneous) Specific dose unknown - Active. Invokana (100MG Tablet, Oral) Active. Levothyroxine Sodium ( Oral) Specific dose unknown - Active. Glucophage (500MG Tablet, Oral) Active. Valsartan-Hydrochlorothiazide ( Oral) Specific dose unknown - Active. Norvasc (5MG Tablet, Oral) Active. Lipitor (40MG Tablet, Oral) Active. Sertraline HCl (100MG Tablet, Oral) Active. Iron ( Oral) Specific dose unknown - Active. Aspirin EC (81MG Tablet DR, Oral) Active. Tylenol (325MG Tablet, Oral) Active.   Past Surgical History  Other Orthopaedic Surgery Tonsillectomy Arthroscopy of Knee. right Arthroscopy of Shoulder. bilateral Carpal Tunnel Repair. bilateral Cesarean Delivery. 3 or more  times Rotator Cuff Repair. left Total Knee Replacement. right  Medical History Depression Diabetes Mellitus, Type II Gastroesophageal Reflux Disease   High blood pressure Hypothyroidism Kidney Stone Osteoarthritis Sleep Apnea Menopause   Review of Systems General:Not Present- Chills, Fever, Night Sweats, Appetite Loss, Fatigue, Feeling sick, Weight Gain and Weight Loss. Skin:Not Present- Itching, Rash, Skin Color Changes, Ulcer, Psoriasis and Change in Hair or Nails. HEENT:Not Present- Sensitivity to light, Hearing problems, Nose Bleed and Ringing in the Ears. Neck:Not Present- Swollen Glands and Neck Mass. Respiratory:Not Present- Snoring, Chronic Cough, Bloody sputum and Dyspnea. Cardiovascular:Present- Leg Cramps. Not Present- Shortness of Breath, Chest Pain, Swelling of Extremities and Palpitations. Gastrointestinal:Not Present- Bloody Stool, Heartburn, Abdominal Pain, Vomiting, Nausea and Incontinence of Stool. Female Genitourinary:Not Present- Blood in Urine, Menstrual Irregularities, Frequency, Incontinence and Nocturia. Musculoskeletal:Not Present- Muscle Weakness, Muscle Pain, Joint Stiffness, Joint Swelling, Joint Pain and Back Pain. Neurological:Not Present- Tingling, Numbness, Burning, Tremor, Headaches and Dizziness. Psychiatric:Not Present- Anxiety, Depression and Memory Loss. Endocrine:Not Present- Cold Intolerance, Heat Intolerance, Excessive hunger and Excessive Thirst. Hematology:Not Present- Abnormal Bleeding, Anemia, Blood Clots and Easy Bruising.   Vitals  Weight: 230 lb Height: 62 in Body Surface Area: 2.14 m Body Mass Index: 42.07 kg/m Pulse: 72 (Regular) Resp.: 16 (Unlabored) BP: 128/68 (Sitting, Left Arm, Standard)    Physical Exam  The physical exam findings are as follows:  Note: Patient is a 61 year old female with continued knee pain.   General Mental Status - Alert, cooperative and good historian. General  Appearance- pleasant. Not in acute distress. Orientation- Oriented X3. Build & Nutrition- Well nourished and Well developed.   Head and Neck Head- normocephalic, atraumatic . Neck Global Assessment- supple. no bruit auscultated on the right and no bruit auscultated on the left.   Eye Pupil- Bilateral- Regular and Round. Motion- Bilateral- EOMI.   Chest and Lung Exam Auscultation: Breath sounds:- clear at anterior chest wall and - clear at posterior chest wall. Adventitious sounds:- No Adventitious sounds.   Cardiovascular Auscultation:Rhythm- Regular rate and rhythm. Heart Sounds- S1 WNL and S2 WNL. Murmurs & Other Heart Sounds:Auscultation of the heart reveals - No Murmurs.   Abdomen Inspection:Contour- Generalized moderate distention. Palpation/Percussion:Tenderness- Abdomen is non-tender to palpation.  Abdomen is soft. Auscultation:Auscultation of the abdomen reveals - Bowel sounds normal.   Female Genitourinary Not done, not pertinent to present illness  Musculoskeletal On exam she is alert and oriented in no apparent distress. Her right knee has range of motion about 0 to 120. There is no specific tenderness or any instability about that knee. Left knee tender to palpation ont he knee, ROM 0 - 115. No effusion. Crepitus noted. No instability.  Assessment & Plan S/P Right total knee arthroplasty (V43.65)  Primary osteoarthritis of one knee (715.16) Impression: Left Knee  Note: Plan is for a Left Total Knee Replacement by Dr. Aluisio.  Plan is to go home with family. She would like to get a hospital bed delivered to her cousin's house postop.  PCP - Dr. James Osborne  The patient does not have any contraindications and will recieve TXA (tranexamic acid) prior to surgery.  Time spent ~ 20 minutes  Signed electronically by Taven Strite L Elyzabeth Goatley, III PA-C 

## 2012-11-05 ENCOUNTER — Inpatient Hospital Stay (HOSPITAL_COMMUNITY): Payer: 59 | Admitting: Anesthesiology

## 2012-11-05 ENCOUNTER — Encounter (HOSPITAL_COMMUNITY): Payer: Self-pay | Admitting: Anesthesiology

## 2012-11-05 ENCOUNTER — Encounter (HOSPITAL_COMMUNITY)
Admission: RE | Disposition: A | Discharge status: Home Health | Payer: Self-pay | Source: Ambulatory Visit | Attending: Orthopedic Surgery

## 2012-11-05 ENCOUNTER — Encounter (HOSPITAL_COMMUNITY): Payer: Self-pay | Admitting: *Deleted

## 2012-11-05 ENCOUNTER — Inpatient Hospital Stay (HOSPITAL_COMMUNITY)
Admission: RE | Admit: 2012-11-05 | Discharge: 2012-11-08 | DRG: 470 | Disposition: A | Discharge status: Home Health | Payer: 59 | Source: Ambulatory Visit | Attending: Orthopedic Surgery | Admitting: Orthopedic Surgery

## 2012-11-05 DIAGNOSIS — M1710 Unilateral primary osteoarthritis, unspecified knee: Principal | ICD-10-CM | POA: Diagnosis present

## 2012-11-05 DIAGNOSIS — E871 Hypo-osmolality and hyponatremia: Secondary | ICD-10-CM

## 2012-11-05 DIAGNOSIS — E119 Type 2 diabetes mellitus without complications: Secondary | ICD-10-CM | POA: Diagnosis present

## 2012-11-05 DIAGNOSIS — M179 Osteoarthritis of knee, unspecified: Secondary | ICD-10-CM | POA: Diagnosis present

## 2012-11-05 DIAGNOSIS — I10 Essential (primary) hypertension: Secondary | ICD-10-CM | POA: Diagnosis present

## 2012-11-05 DIAGNOSIS — F329 Major depressive disorder, single episode, unspecified: Secondary | ICD-10-CM | POA: Diagnosis present

## 2012-11-05 DIAGNOSIS — E039 Hypothyroidism, unspecified: Secondary | ICD-10-CM | POA: Diagnosis present

## 2012-11-05 DIAGNOSIS — F3289 Other specified depressive episodes: Secondary | ICD-10-CM | POA: Diagnosis present

## 2012-11-05 DIAGNOSIS — D62 Acute posthemorrhagic anemia: Secondary | ICD-10-CM

## 2012-11-05 DIAGNOSIS — G4730 Sleep apnea, unspecified: Secondary | ICD-10-CM | POA: Diagnosis present

## 2012-11-05 DIAGNOSIS — K219 Gastro-esophageal reflux disease without esophagitis: Secondary | ICD-10-CM | POA: Diagnosis present

## 2012-11-05 DIAGNOSIS — Z96659 Presence of unspecified artificial knee joint: Secondary | ICD-10-CM

## 2012-11-05 DIAGNOSIS — Z96652 Presence of left artificial knee joint: Secondary | ICD-10-CM

## 2012-11-05 DIAGNOSIS — Z87442 Personal history of urinary calculi: Secondary | ICD-10-CM

## 2012-11-05 HISTORY — PX: TOTAL KNEE ARTHROPLASTY: SHX125

## 2012-11-05 LAB — TYPE AND SCREEN: ABO/RH(D): A POS

## 2012-11-05 LAB — GLUCOSE, CAPILLARY
Glucose-Capillary: 85 mg/dL (ref 70–99)
Glucose-Capillary: 128 mg/dL — ABNORMAL HIGH (ref 70–99)

## 2012-11-05 SURGERY — ARTHROPLASTY, KNEE, TOTAL
Anesthesia: Spinal | Site: Knee | Laterality: Left | Wound class: Clean

## 2012-11-05 MED ORDER — TRAMADOL HCL 50 MG PO TABS: 50.0000 mg | ORAL_TABLET | Freq: Four times a day (QID) | ORAL | Status: DC | PRN

## 2012-11-05 MED ORDER — PROMETHAZINE HCL 25 MG/ML IJ SOLN: 6.2500 mg | INTRAMUSCULAR | Status: DC | PRN

## 2012-11-05 MED ORDER — METOCLOPRAMIDE HCL 5 MG/ML IJ SOLN: 5.0000 mg | Freq: Three times a day (TID) | INTRAMUSCULAR | Status: DC | PRN

## 2012-11-05 MED ORDER — OXYCODONE HCL 5 MG PO TABS: 5.0000 mg | ORAL_TABLET | Freq: Once | ORAL | Status: DC | PRN

## 2012-11-05 MED ORDER — METOCLOPRAMIDE HCL 10 MG PO TABS
5.0000 mg | ORAL_TABLET | Freq: Three times a day (TID) | ORAL | Status: DC | PRN
  Administered 2012-11-06 – 2012-11-07 (×2): 10 mg via ORAL
  Filled 2012-11-05 (×2): qty 1

## 2012-11-05 MED ORDER — PROPOFOL INFUSION 10 MG/ML OPTIME
INTRAVENOUS | Status: DC | PRN
  Administered 2012-11-05: 100 ug/kg/min via INTRAVENOUS

## 2012-11-05 MED ORDER — KETAMINE HCL 50 MG/ML IJ SOLN
INTRAMUSCULAR | Status: DC | PRN
  Administered 2012-11-05 (×5): 5 mg via INTRAMUSCULAR

## 2012-11-05 MED ORDER — OXYCODONE HCL 5 MG/5ML PO SOLN
5.0000 mg | Freq: Once | ORAL | Status: DC | PRN
  Filled 2012-11-05: qty 5

## 2012-11-05 MED ORDER — LEVOTHYROXINE SODIUM 125 MCG PO TABS
125.0000 ug | ORAL_TABLET | Freq: Every day | ORAL | Status: DC
  Administered 2012-11-06 – 2012-11-08 (×3): 125 ug via ORAL
  Filled 2012-11-05 (×5): qty 1

## 2012-11-05 MED ORDER — INSULIN ASPART 100 UNIT/ML ~~LOC~~ SOLN
0.0000 [IU] | Freq: Three times a day (TID) | SUBCUTANEOUS | Status: DC
Start: 2012-11-05 — End: 2012-11-08

## 2012-11-05 MED ORDER — LIRAGLUTIDE 18 MG/3ML ~~LOC~~ SOPN: 1.8000 mg | PEN_INJECTOR | Freq: Every day | SUBCUTANEOUS | Status: DC

## 2012-11-05 MED ORDER — ONDANSETRON HCL 4 MG PO TABS
4.0000 mg | ORAL_TABLET | Freq: Four times a day (QID) | ORAL | Status: DC | PRN
  Administered 2012-11-06: 4 mg via ORAL
  Filled 2012-11-05: qty 1

## 2012-11-05 MED ORDER — LACTATED RINGERS IV SOLN
INTRAVENOUS | Status: DC | PRN
  Administered 2012-11-05 (×2): via INTRAVENOUS

## 2012-11-05 MED ORDER — IRBESARTAN 300 MG PO TABS
300.0000 mg | ORAL_TABLET | Freq: Every evening | ORAL | Status: DC
  Administered 2012-11-05 – 2012-11-07 (×3): 300 mg via ORAL
  Filled 2012-11-05 (×4): qty 1

## 2012-11-05 MED ORDER — DEXAMETHASONE SODIUM PHOSPHATE 10 MG/ML IJ SOLN
10.0000 mg | Freq: Every day | INTRAMUSCULAR | Status: AC
  Filled 2012-11-05: qty 1

## 2012-11-05 MED ORDER — ONDANSETRON HCL 4 MG/2ML IJ SOLN
INTRAMUSCULAR | Status: DC | PRN
  Administered 2012-11-05: 4 mg via INTRAVENOUS

## 2012-11-05 MED ORDER — 0.9 % SODIUM CHLORIDE (POUR BTL) OPTIME
TOPICAL | Status: DC | PRN
  Administered 2012-11-05: 1000 mL

## 2012-11-05 MED ORDER — ONDANSETRON HCL 4 MG/2ML IJ SOLN: 4.0000 mg | Freq: Four times a day (QID) | INTRAMUSCULAR | Status: DC | PRN

## 2012-11-05 MED ORDER — ACETAMINOPHEN 500 MG PO TABS
1000.0000 mg | ORAL_TABLET | Freq: Four times a day (QID) | ORAL | Status: AC
  Administered 2012-11-05: 1000 mg via ORAL
  Filled 2012-11-05: qty 2

## 2012-11-05 MED ORDER — PHENOL 1.4 % MT LIQD: 1.0000 | OROMUCOSAL | Status: DC | PRN

## 2012-11-05 MED ORDER — DEXAMETHASONE 4 MG PO TABS
10.0000 mg | ORAL_TABLET | Freq: Every day | ORAL | Status: AC
  Administered 2012-11-06: 10 mg via ORAL
  Filled 2012-11-05: qty 1

## 2012-11-05 MED ORDER — STERILE WATER FOR IRRIGATION IR SOLN
Status: DC | PRN
  Administered 2012-11-05: 3000 mL

## 2012-11-05 MED ORDER — AMLODIPINE BESYLATE 5 MG PO TABS
5.0000 mg | ORAL_TABLET | Freq: Every evening | ORAL | Status: DC
Start: 2012-11-05 — End: 2012-11-08
  Administered 2012-11-05 – 2012-11-07 (×3): 5 mg via ORAL
  Filled 2012-11-05 (×4): qty 1

## 2012-11-05 MED ORDER — INSULIN GLARGINE 100 UNIT/ML ~~LOC~~ SOLN
70.0000 [IU] | Freq: Every day | SUBCUTANEOUS | Status: DC
  Administered 2012-11-05 – 2012-11-07 (×3): 70 [IU] via SUBCUTANEOUS
  Filled 2012-11-05 (×4): qty 0.7

## 2012-11-05 MED ORDER — RIVAROXABAN 10 MG PO TABS
10.0000 mg | ORAL_TABLET | Freq: Every day | ORAL | Status: DC
  Administered 2012-11-06 – 2012-11-08 (×3): 10 mg via ORAL
  Filled 2012-11-05 (×4): qty 1

## 2012-11-05 MED ORDER — FLEET ENEMA 7-19 GM/118ML RE ENEM: 1.0000 | ENEMA | Freq: Once | RECTAL | Status: AC | PRN

## 2012-11-05 MED ORDER — BUPIVACAINE LIPOSOME 1.3 % IJ SUSP
20.0000 mL | Freq: Once | INTRAMUSCULAR | Status: DC
  Filled 2012-11-05: qty 20

## 2012-11-05 MED ORDER — CANAGLIFLOZIN 100 MG PO TABS
1.0000 | ORAL_TABLET | Freq: Every day | ORAL | Status: DC
  Administered 2012-11-06 – 2012-11-08 (×3): 100 mg via ORAL

## 2012-11-05 MED ORDER — BISACODYL 10 MG RE SUPP: 10.0000 mg | Freq: Every day | RECTAL | Status: DC | PRN

## 2012-11-05 MED ORDER — HYDROMORPHONE HCL PF 1 MG/ML IJ SOLN: 0.2500 mg | INTRAMUSCULAR | Status: DC | PRN

## 2012-11-05 MED ORDER — OXYCODONE HCL 5 MG PO TABS
5.0000 mg | ORAL_TABLET | ORAL | Status: DC | PRN
  Administered 2012-11-05: 5 mg via ORAL
  Administered 2012-11-06 – 2012-11-08 (×13): 10 mg via ORAL
  Filled 2012-11-05 (×13): qty 2
  Filled 2012-11-05: qty 1

## 2012-11-05 MED ORDER — BUPIVACAINE HCL 0.25 % IJ SOLN
INTRAMUSCULAR | Status: DC | PRN
  Administered 2012-11-05: 20 mL

## 2012-11-05 MED ORDER — ACETAMINOPHEN 500 MG PO TABS
1000.0000 mg | ORAL_TABLET | Freq: Once | ORAL | Status: AC
  Administered 2012-11-05: 1000 mg via ORAL
  Filled 2012-11-05: qty 2

## 2012-11-05 MED ORDER — METFORMIN HCL 500 MG PO TABS
1000.0000 mg | ORAL_TABLET | Freq: Two times a day (BID) | ORAL | Status: DC
  Administered 2012-11-06: 1000 mg via ORAL
  Filled 2012-11-05 (×4): qty 2

## 2012-11-05 MED ORDER — VALSARTAN-HYDROCHLOROTHIAZIDE 320-25 MG PO TABS: 1.0000 | ORAL_TABLET | Freq: Every evening | ORAL | Status: DC

## 2012-11-05 MED ORDER — POLYETHYLENE GLYCOL 3350 17 G PO PACK
17.0000 g | PACK | Freq: Every day | ORAL | Status: DC | PRN
  Administered 2012-11-06: 17 g via ORAL

## 2012-11-05 MED ORDER — POTASSIUM CHLORIDE IN NACL 20-0.9 MEQ/L-% IV SOLN
INTRAVENOUS | Status: DC
  Administered 2012-11-05 – 2012-11-06 (×2): via INTRAVENOUS
  Filled 2012-11-05 (×4): qty 1000

## 2012-11-05 MED ORDER — HYDROCHLOROTHIAZIDE 25 MG PO TABS
25.0000 mg | ORAL_TABLET | Freq: Every evening | ORAL | Status: DC
  Administered 2012-11-05 – 2012-11-07 (×3): 25 mg via ORAL
  Filled 2012-11-05 (×4): qty 1

## 2012-11-05 MED ORDER — MORPHINE SULFATE 2 MG/ML IJ SOLN
1.0000 mg | INTRAMUSCULAR | Status: DC | PRN
  Administered 2012-11-05 – 2012-11-07 (×5): 2 mg via INTRAVENOUS
  Filled 2012-11-05 (×5): qty 1

## 2012-11-05 MED ORDER — BUPIVACAINE IN DEXTROSE 0.75-8.25 % IT SOLN
INTRATHECAL | Status: DC | PRN
  Administered 2012-11-05: 2 mL via INTRATHECAL

## 2012-11-05 MED ORDER — MEPERIDINE HCL 50 MG/ML IJ SOLN: 6.2500 mg | INTRAMUSCULAR | Status: DC | PRN

## 2012-11-05 MED ORDER — METHOCARBAMOL 100 MG/ML IJ SOLN
500.0000 mg | Freq: Four times a day (QID) | INTRAVENOUS | Status: DC | PRN
  Administered 2012-11-05: 500 mg via INTRAVENOUS
  Filled 2012-11-05 (×2): qty 5

## 2012-11-05 MED ORDER — CEFAZOLIN SODIUM-DEXTROSE 2-3 GM-% IV SOLR
2.0000 g | INTRAVENOUS | Status: AC
  Administered 2012-11-05: 2 g via INTRAVENOUS

## 2012-11-05 MED ORDER — MIDAZOLAM HCL 5 MG/5ML IJ SOLN
INTRAMUSCULAR | Status: DC | PRN
  Administered 2012-11-05 (×2): 1 mg via INTRAVENOUS

## 2012-11-05 MED ORDER — ATORVASTATIN CALCIUM 40 MG PO TABS
40.0000 mg | ORAL_TABLET | Freq: Every day | ORAL | Status: DC
  Administered 2012-11-05 – 2012-11-08 (×4): 40 mg via ORAL
  Filled 2012-11-05 (×4): qty 1

## 2012-11-05 MED ORDER — TRANEXAMIC ACID 100 MG/ML IV SOLN
1000.0000 mg | INTRAVENOUS | Status: AC
  Administered 2012-11-05: 1000 mg via INTRAVENOUS
  Filled 2012-11-05: qty 10

## 2012-11-05 MED ORDER — MENTHOL 3 MG MT LOZG: 1.0000 | LOZENGE | OROMUCOSAL | Status: DC | PRN

## 2012-11-05 MED ORDER — METHOCARBAMOL 500 MG PO TABS
500.0000 mg | ORAL_TABLET | Freq: Four times a day (QID) | ORAL | Status: DC | PRN
  Administered 2012-11-05 – 2012-11-08 (×3): 500 mg via ORAL
  Filled 2012-11-05 (×3): qty 1

## 2012-11-05 MED ORDER — SODIUM CHLORIDE 0.9 % IV SOLN: INTRAVENOUS | Status: DC

## 2012-11-05 MED ORDER — CEFAZOLIN SODIUM 1-5 GM-% IV SOLN
1.0000 g | Freq: Four times a day (QID) | INTRAVENOUS | Status: AC
  Administered 2012-11-05 (×2): 1 g via INTRAVENOUS
  Filled 2012-11-05 (×2): qty 50

## 2012-11-05 MED ORDER — SODIUM CHLORIDE 0.9 % IJ SOLN
INTRAMUSCULAR | Status: DC | PRN
  Administered 2012-11-05: 08:00:00

## 2012-11-05 MED ORDER — LIRAGLUTIDE 18 MG/3ML ~~LOC~~ SOLN: 1.8000 mg | SUBCUTANEOUS | Status: DC

## 2012-11-05 MED ORDER — DEXAMETHASONE SODIUM PHOSPHATE 10 MG/ML IJ SOLN: 10.0000 mg | Freq: Once | INTRAMUSCULAR | Status: DC

## 2012-11-05 MED ORDER — DIPHENHYDRAMINE HCL 12.5 MG/5ML PO ELIX: 12.5000 mg | ORAL_SOLUTION | ORAL | Status: DC | PRN

## 2012-11-05 MED ORDER — SERTRALINE HCL 100 MG PO TABS
100.0000 mg | ORAL_TABLET | Freq: Every evening | ORAL | Status: DC
  Administered 2012-11-05 – 2012-11-07 (×3): 100 mg via ORAL
  Filled 2012-11-05 (×4): qty 1

## 2012-11-05 MED ORDER — DOCUSATE SODIUM 100 MG PO CAPS
100.0000 mg | ORAL_CAPSULE | Freq: Two times a day (BID) | ORAL | Status: DC
  Administered 2012-11-05 – 2012-11-08 (×6): 100 mg via ORAL

## 2012-11-05 MED ORDER — KETOROLAC TROMETHAMINE 15 MG/ML IJ SOLN
7.5000 mg | Freq: Four times a day (QID) | INTRAMUSCULAR | Status: AC | PRN
  Administered 2012-11-05 – 2012-11-06 (×3): 7.5 mg via INTRAVENOUS
  Filled 2012-11-05 (×2): qty 1

## 2012-11-05 SURGICAL SUPPLY — 57 items
BAG ZIPLOCK 12X15 (MISCELLANEOUS) ×2 IMPLANT
BANDAGE ELASTIC 6 VELCRO ST LF (GAUZE/BANDAGES/DRESSINGS) ×2 IMPLANT
BANDAGE ESMARK 6X9 LF (GAUZE/BANDAGES/DRESSINGS) ×1 IMPLANT
BLADE SAG 18X100X1.27 (BLADE) ×2 IMPLANT
BLADE SAW SGTL 11.0X1.19X90.0M (BLADE) ×2 IMPLANT
BNDG ESMARK 6X9 LF (GAUZE/BANDAGES/DRESSINGS) ×2
BOWL SMART MIX CTS (DISPOSABLE) ×2 IMPLANT
CAPT RP KNEE ×2 IMPLANT
CEMENT HV SMART SET (Cement) ×4 IMPLANT
CLOSURE STERI-STRIP 1/4X4 (GAUZE/BANDAGES/DRESSINGS) ×2 IMPLANT
CLOTH BEACON ORANGE TIMEOUT ST (SAFETY) ×2 IMPLANT
CUFF TOURN SGL QUICK 34 (TOURNIQUET CUFF) ×1
CUFF TRNQT CYL 34X4X40X1 (TOURNIQUET CUFF) ×1 IMPLANT
DECANTER SPIKE VIAL GLASS SM (MISCELLANEOUS) ×2 IMPLANT
DRAPE EXTREMITY T 121X128X90 (DRAPE) ×2 IMPLANT
DRAPE POUCH INSTRU U-SHP 10X18 (DRAPES) ×2 IMPLANT
DRAPE U-SHAPE 47X51 STRL (DRAPES) ×2 IMPLANT
DRSG ADAPTIC 3X8 NADH LF (GAUZE/BANDAGES/DRESSINGS) ×2 IMPLANT
DRSG EMULSION OIL 3X16 NADH (GAUZE/BANDAGES/DRESSINGS) ×2 IMPLANT
DRSG PAD ABDOMINAL 8X10 ST (GAUZE/BANDAGES/DRESSINGS) ×2 IMPLANT
DURAPREP 26ML APPLICATOR (WOUND CARE) ×2 IMPLANT
ELECT REM PT RETURN 9FT ADLT (ELECTROSURGICAL) ×2
ELECTRODE REM PT RTRN 9FT ADLT (ELECTROSURGICAL) ×1 IMPLANT
EVACUATOR 1/8 PVC DRAIN (DRAIN) ×2 IMPLANT
FACESHIELD LNG OPTICON STERILE (SAFETY) ×10 IMPLANT
GLOVE BIO SURGEON STRL SZ7.5 (GLOVE) IMPLANT
GLOVE BIO SURGEON STRL SZ8 (GLOVE) ×2 IMPLANT
GLOVE BIOGEL PI IND STRL 8 (GLOVE) ×2 IMPLANT
GLOVE BIOGEL PI INDICATOR 8 (GLOVE) ×2
GLOVE SURG SS PI 6.5 STRL IVOR (GLOVE) IMPLANT
GOWN STRL NON-REIN LRG LVL3 (GOWN DISPOSABLE) ×2 IMPLANT
GOWN STRL REIN XL XLG (GOWN DISPOSABLE) IMPLANT
HANDPIECE INTERPULSE COAX TIP (DISPOSABLE) ×2
IMMOBILIZER KNEE 20 (SOFTGOODS) ×2
IMMOBILIZER KNEE 20 THIGH 36 (SOFTGOODS) ×1 IMPLANT
KIT BASIN OR (CUSTOM PROCEDURE TRAY) ×2 IMPLANT
MANIFOLD NEPTUNE II (INSTRUMENTS) ×2 IMPLANT
NDL SAFETY ECLIPSE 18X1.5 (NEEDLE) ×2 IMPLANT
NEEDLE HYPO 18GX1.5 SHARP (NEEDLE) ×2
NS IRRIG 1000ML POUR BTL (IV SOLUTION) ×2 IMPLANT
PACK TOTAL JOINT (CUSTOM PROCEDURE TRAY) ×2 IMPLANT
PADDING CAST COTTON 6X4 STRL (CAST SUPPLIES) ×4 IMPLANT
POSITIONER SURGICAL ARM (MISCELLANEOUS) ×2 IMPLANT
SET HNDPC FAN SPRY TIP SCT (DISPOSABLE) ×1 IMPLANT
SPONGE GAUZE 4X4 12PLY (GAUZE/BANDAGES/DRESSINGS) ×2 IMPLANT
STRIP CLOSURE SKIN 1/2X4 (GAUZE/BANDAGES/DRESSINGS) ×4 IMPLANT
SUCTION FRAZIER 12FR DISP (SUCTIONS) ×2 IMPLANT
SUT MNCRL AB 4-0 PS2 18 (SUTURE) ×2 IMPLANT
SUT VIC AB 2-0 CT1 27 (SUTURE) ×3
SUT VIC AB 2-0 CT1 TAPERPNT 27 (SUTURE) ×3 IMPLANT
SUT VLOC 180 0 24IN GS25 (SUTURE) ×2 IMPLANT
SYR 20CC LL (SYRINGE) ×2 IMPLANT
SYR 50ML LL SCALE MARK (SYRINGE) ×2 IMPLANT
TOWEL OR 17X26 10 PK STRL BLUE (TOWEL DISPOSABLE) ×4 IMPLANT
TRAY FOLEY CATH 14FRSI W/METER (CATHETERS) ×2 IMPLANT
WATER STERILE IRR 1500ML POUR (IV SOLUTION) ×2 IMPLANT
WRAP KNEE MAXI GEL POST OP (GAUZE/BANDAGES/DRESSINGS) ×2 IMPLANT

## 2012-11-05 NOTE — Interval H&P Note (Signed)
History and Physical Interval Note:  11/05/2012 6:46 AM  [name]  has presented today for surgery, with the diagnosis of OA OF LEFT KNEE  The various methods of treatment have been discussed with the patient and family. After consideration of risks, benefits and other options for treatment, the patient has consented to  Procedure(s): LEFT TOTAL KNEE ARTHROPLASTY (Left) as a surgical intervention .  The patient's history has been reviewed, patient examined, no change in status, stable for surgery.  I have reviewed the patient's chart and labs.  Questions were answered to the patient's satisfaction.     Loanne Drilling

## 2012-11-05 NOTE — Transfer of Care (Signed)
Immediate Anesthesia Transfer of Care Note  Patient: [name]  Procedure(s) Performed: Procedure(s): LEFT TOTAL KNEE ARTHROPLASTY (Left)  Patient Location: PACU  Anesthesia Type:MAC and Regional  Level of Consciousness: awake, alert  and oriented  Airway & Oxygen Therapy: Patient Spontanous Breathing and Patient connected to face mask oxygen  Post-op Assessment: Report given to PACU RN and Post -op Vital signs reviewed and stable  Post vital signs: Reviewed and stable  Complications: No apparent anesthesia complications

## 2012-11-05 NOTE — Progress Notes (Signed)
Pt has c-pap mask in car c family

## 2012-11-05 NOTE — Progress Notes (Signed)
Hypoglycemic Event  CBG: 67  Treatment: 15 GM carbohydrate snack  Symptoms: None  Follow-up CBG: Time:1805 CBG Result:85  Possible Reasons for Event: Inadequate meal intake  Comments/MD notified:Pt's meal had arrived to room and pt had eaten half of meal when this RN realized that pt's CBG was low. Asked NT to recheck level, it was 85. Held sliding scale insulin and PM dose of metformin.     Eugene Garnet  Remember to initiate Hypoglycemia Order Set & complete

## 2012-11-05 NOTE — Preoperative (Signed)
Beta Blockers   Reason not to administer Beta Blockers:Not Applicable 

## 2012-11-05 NOTE — H&P (View-Only) (Signed)
[name]  DOB: 1950/09/19 Single / Language: [place] / Race: White Female  Chief Complaint:  Left Knee Pain  Date of Admission:  11/05/2012  History of Present Illness The patient is a 62 year old female who comes in for a preoperative History and Physical. The patient is scheduled for a left total knee arthroplasty to be performed by Dr. Gus Rankin. Aluisio, MD at [facility] on 11/05/2012. The patient is being followed for their left knee pain and osteoarthritis. They are out from Synvisc series (that did not provide much relief after the first injection). Symptoms reported today include: pain (that is becoming more constant).  Patient is over 2 years postop following total knee arthroplasty. Overall the patient feels that the right knee is not doing well (still having a lot of pain). The patient does report pain and swelling (stiffness especially at the end of the day). It is likely due to compensation off of the left knee. She still has soreness on the lateral aspect of the right knee. Left knee is bothering her a lot more. She has known arthritis in the left knee. She has had Visco supplements in the past. The knee continues to hurt and she is now ready to get the left knee replaced. They have been treated conservatively in the past for the above stated problem and despite conservative measures, they continue to have progressive pain and severe functional limitations and dysfunction. They have failed non-operative management including home exercise, medications, and injections. It is felt that they would benefit from undergoing total joint replacement. Risks and benefits of the procedure have been discussed with the patient and they elect to proceed with surgery. There are no active contraindications to surgery such as ongoing infection or rapidly progressive neurological disease.  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 mother Osteoarthritis. sister Heart Disease. mother Osteoporosis. mother Cancer. father, grandmother mothers side, grandmother fathers side and grandfather fathers side Congestive Heart Failure. mother Diabetes Mellitus. mother Cerebrovascular Accident. grandmother mothers side and grandmother fathers side   Social History Drug/Alcohol Rehab (Previously). no Current work status. working full time Drug/Alcohol Rehab (Currently). no Exercise. Exercises weekly; does running / walking, individual sport and gym / weights Exercises weekly; does other Illicit drug use. no Living situation. live alone Previously in rehab. no Children. 2 No alcohol use Alcohol use. current drinker; drinks wine; only occasionally per week Tobacco / smoke exposure. no Most recent primary occupation. Admissions Associate Number of flights of stairs before winded. 2-3 Pain Contract. no Marital status. single Tobacco use. Former smoker. former smoker; smoke(d) 1/2 pack(s) per day   Medication History Lantus ( Subcutaneous) Specific dose unknown - Active. Victoza ( Subcutaneous) Specific dose unknown - Active. Invokana (100MG  Tablet, Oral) Active. Levothyroxine Sodium ( Oral) Specific dose unknown - Active. Glucophage (500MG  Tablet, Oral) Active. Valsartan-Hydrochlorothiazide ( Oral) Specific dose unknown - Active. Norvasc (5MG  Tablet, Oral) Active. Lipitor (40MG  Tablet, Oral) Active. Sertraline HCl (100MG  Tablet, Oral) Active. Iron ( Oral) Specific dose unknown - Active. Aspirin EC (81MG  Tablet DR, Oral) Active. Tylenol (325MG  Tablet, Oral) Active.   Past Surgical History  Other Orthopaedic Surgery Tonsillectomy Arthroscopy of Knee. right Arthroscopy of Shoulder. bilateral Carpal Tunnel Repair. bilateral Cesarean Delivery. 3 or more  times Rotator Cuff Repair. left Total Knee Replacement. right  Medical History Depression Diabetes Mellitus, Type II Gastroesophageal Reflux Disease  High blood pressure Hypothyroidism Kidney Stone Osteoarthritis Sleep Apnea Menopause   Review of Systems General:Not Present- Chills, Fever, Night Sweats, Appetite Loss, Fatigue, Feeling sick, Weight Gain and Weight Loss. Skin:Not Present- Itching, Rash, Skin Color Changes, Ulcer, Psoriasis and Change in Hair or Nails. HEENT:Not Present- Sensitivity to light, Hearing problems, Nose Bleed and Ringing in the Ears. Neck:Not Present- Swollen Glands and Neck Mass. Respiratory:Not Present- Snoring, Chronic Cough, Bloody sputum and Dyspnea. Cardiovascular:Present- Leg Cramps. Not Present- Shortness of Breath, Chest Pain, Swelling of Extremities and Palpitations. Gastrointestinal:Not Present- Bloody Stool, Heartburn, Abdominal Pain, Vomiting, Nausea and Incontinence of Stool. Female Genitourinary:Not Present- Blood in Urine, Menstrual Irregularities, Frequency, Incontinence and Nocturia. Musculoskeletal:Not Present- Muscle Weakness, Muscle Pain, Joint Stiffness, Joint Swelling, Joint Pain and Back Pain. Neurological:Not Present- Tingling, Numbness, Burning, Tremor, Headaches and Dizziness. Psychiatric:Not Present- Anxiety, Depression and Memory Loss. Endocrine:Not Present- Cold Intolerance, Heat Intolerance, Excessive hunger and Excessive Thirst. Hematology:Not Present- Abnormal Bleeding, Anemia, Blood Clots and Easy Bruising.   Vitals  Weight: 230 lb Height: 62 in Body Surface Area: 2.14 m Body Mass Index: 42.07 kg/m Pulse: 72 (Regular) Resp.: 16 (Unlabored) BP: 128/68 (Sitting, Left Arm, Standard)    Physical Exam  The physical exam findings are as follows:  Note: Patient is a 62 year old female with continued knee pain.   General Mental Status - Alert, cooperative and good historian. General  Appearance- pleasant. Not in acute distress. Orientation- Oriented X3. Build & Nutrition- Well nourished and Well developed.   Head and Neck Head- normocephalic, atraumatic . Neck Global Assessment- supple. no bruit auscultated on the right and no bruit auscultated on the left.   Eye Pupil- Bilateral- Regular and Round. Motion- Bilateral- EOMI.   Chest and Lung Exam Auscultation: Breath sounds:- clear at anterior chest wall and - clear at posterior chest wall. Adventitious sounds:- No Adventitious sounds.   Cardiovascular Auscultation:Rhythm- Regular rate and rhythm. Heart Sounds- S1 WNL and S2 WNL. Murmurs & Other Heart Sounds:Auscultation of the heart reveals - No Murmurs.   Abdomen Inspection:Contour- Generalized moderate distention. Palpation/Percussion:Tenderness- Abdomen is non-tender to palpation.  Abdomen is soft. Auscultation:Auscultation of the abdomen reveals - Bowel sounds normal.   Female Genitourinary Not done, not pertinent to present illness  Musculoskeletal On exam she is alert and oriented in no apparent distress. Her right knee has range of motion about 0 to 120. There is no specific tenderness or any instability about that knee. Left knee tender to palpation ont he knee, ROM 0 - 115. No effusion. Crepitus noted. No instability.  Assessment & Plan S/P Right total knee arthroplasty (V43.65)  Primary osteoarthritis of one knee (715.16) Impression: Left Knee  Note: Plan is for a Left Total Knee Replacement by Dr. Lequita Halt.  Plan is to go home with family. She would like to get a hospital bed delivered to her cousin's house postop.  PCP - Dr. [name]  The patient does not have any contraindications and will recieve TXA (tranexamic acid) prior to surgery.  Time spent ~ 20 minutes  Signed electronically by Lauraine Rinne, III PA-C

## 2012-11-05 NOTE — Anesthesia Postprocedure Evaluation (Signed)
Anesthesia Post Note  Patient: [name]  Procedure(s) Performed: Procedure(s) (LRB): LEFT TOTAL KNEE ARTHROPLASTY (Left)  Anesthesia type: Spinal  Patient location: PACU  Post pain: Pain level controlled  Post assessment: Post-op Vital signs reviewed  Last Vitals: BP 137/68  Pulse 67  Temp(Src) 36.6 C (Oral)  Resp 18  Ht 5\' 2"  (1.575 m)  Wt 241 lb (109.317 kg)  BMI 44.07 kg/m2  SpO2 98%  Post vital signs: Reviewed  Level of consciousness: sedated  Complications: No apparent anesthesia complications

## 2012-11-05 NOTE — Anesthesia Preprocedure Evaluation (Addendum)
Anesthesia Evaluation  Patient identified by MRN, date of birth, ID band Patient awake    Reviewed: Allergy & Precautions, H&P , NPO status , Patient's Chart, lab work & pertinent test results  History of Anesthesia Complications (+) PONV  Airway Mallampati: II TM Distance: >3 FB Neck ROM: Full    Dental no notable dental hx. (+) Caps and Dental Advisory Given   Pulmonary sleep apnea and Continuous Positive Airway Pressure Ventilation , pneumonia -, resolved,  breath sounds clear to auscultation  Pulmonary exam normal       Cardiovascular hypertension, Pt. on medications negative cardio ROS  Rhythm:Regular Rate:Normal     Neuro/Psych PSYCHIATRIC DISORDERS Depression negative neurological ROS     GI/Hepatic negative GI ROS, Neg liver ROS, GERD-  Medicated and Controlled,  Endo/Other  diabetes, Type 2Hypothyroidism Morbid obesity  Renal/GU negative Renal ROS     Musculoskeletal negative musculoskeletal ROS (+)   Abdominal   Peds  Hematology negative hematology ROS (+)   Anesthesia Other Findings   Reproductive/Obstetrics negative OB ROS                           Anesthesia Physical  Anesthesia Plan  ASA: III  Anesthesia Plan: Spinal   Post-op Pain Management:    Induction: Intravenous  Airway Management Planned:   Additional Equipment:   Intra-op Plan:   Post-operative Plan:   Informed Consent: I have reviewed the patients History and Physical, chart, labs and discussed the procedure including the risks, benefits and alternatives for the proposed anesthesia with the patient or authorized representative who has indicated his/her understanding and acceptance.   Dental advisory given  Plan Discussed with: CRNA  Anesthesia Plan Comments:         Anesthesia Quick Evaluation

## 2012-11-05 NOTE — Evaluation (Addendum)
Physical Therapy Evaluation Patient Details Name: [name] MRN: [id number] DOB: 11-18-1950 Today's Date: 11/05/2012 Time: 4098-1191 PT Time Calculation (min): 33 min  PT Assessment / Plan / Recommendation History of Present Illness  s/p Left TKA  Clinical Impression  Pt  Will benefit from PT due to deficits below; plan is to go home with HHPT and family assist prn;    PT Assessment  Patient needs continued PT services    Follow Up Recommendations  Home health PT    Does the patient have the potential to tolerate intense rehabilitation      Barriers to Discharge        Equipment Recommendations       Recommendations for Other Services     Frequency 7X/week    Precautions / Restrictions Precautions Precautions: Knee Restrictions KI not utilized, I SLR LLE at time of this eval LLE Weight Bearing: Weight bearing as tolerated   Pertinent Vitals/Pain   VSS; pain controlled        Mobility  Bed Mobility Bed Mobility: Sit to Supine Supine to Sit: 4: Min assist Sit to Supine: 4: Min assist Details for Bed Mobility Assistance: min with LLE Transfers Transfers: Sit to Stand;Stand to Sit Sit to Stand: 4: Min assist;From bed Stand to Sit: 4: Min assist;To chair/3-in-1 Details for Transfer Assistance: cues for hand placement and LLE management Ambulation/Gait Ambulation/Gait Assistance: 4: Min guard Ambulation Distance (Feet): 160 Feet Assistive device: Rolling walker Ambulation/Gait Assistance Details: cues for sequence initially Gait Pattern: Step-to pattern;Step-through pattern    Exercises Total Joint Exercises Ankle Circles/Pumps: AROM;Both;10 reps Quad Sets: AROM;Both;10 reps Heel Slides: AROM;Left;5 reps;AAROM   PT Diagnosis: Difficulty walking  PT Problem List: Decreased range of motion;Decreased strength;Decreased activity tolerance;Decreased knowledge of use of DME;Decreased mobility PT Treatment Interventions: Functional mobility training;Gait  training;Stair training;DME instruction;Therapeutic activities;Therapeutic exercise;Patient/family education     PT Goals(Current goals can be found in the care plan section) Acute Rehab PT Goals Patient Stated Goal: decr pain PT Goal Formulation: With patient Time For Goal Achievement: 11/12/12 Potential to Achieve Goals: Good  Visit Information  Last PT Received On: 11/05/12 Assistance Needed: +1 History of Present Illness: s/p Left TKA       Prior Functioning  Home Living Family/patient expects to be discharged to:: Private residence Living Arrangements: Alone Available Help at Discharge: Family Type of Home: House Home Access: Stairs to enter Secretary/administrator of Steps: 3 Home Layout: One level Home Equipment: Environmental consultant - 2 wheels Additional Comments: comfort ht toilets;  cousin coming to stay with her Prior Function Level of Independence: Independent Communication Communication: No difficulties    Cognition  Cognition Arousal/Alertness: Awake/alert Behavior During Therapy: WFL for tasks assessed/performed Overall Cognitive Status: Within Functional Limits for tasks assessed    Extremity/Trunk Assessment Upper Extremity Assessment Upper Extremity Assessment: Defer to OT evaluation Lower Extremity Assessment Lower Extremity Assessment: LLE deficits/detail LLE Deficits / Details: ankle WFL, I SLR Cervical / Trunk Assessment Cervical / Trunk Assessment: Normal   Balance    End of Session PT - End of Session Activity Tolerance: Patient tolerated treatment well Patient left: in bed;with call bell/phone within reach Nurse Communication: Mobility status CPM Left Knee CPM Left Knee: Off  GP     [facility] 11/05/2012, 3:31 PM

## 2012-11-05 NOTE — Op Note (Signed)
Pre-operative diagnosis- Osteoarthritis  Left knee(s)  Post-operative diagnosis- Osteoarthritis Left knee(s)  Procedure-  Left  Total Knee Arthroplasty  Surgeon- Gus Rankin. Davionte Lusby, MD  Assistant- Leilani Able, PA-C   Anesthesia-  Spinal EBL-* No blood loss amount entered *  Drains Hemovac  Tourniquet time-  Total Tourniquet Time Documented: Thigh (Left) - 36 minutes Total: Thigh (Left) - 36 minutes    Complications- None  Condition-PACU - hemodynamically stable.   Brief Clinical Note  [name] is a 62 y.o. year old female with end stage OA of her left knee with progressively worsening pain and dysfunction. She has constant pain, with activity and at rest and significant functional deficits with difficulties even with ADLs. She has had extensive non-op management including analgesics, injections of cortisone and viscosupplements, and home exercise program, but remains in significant pain with significant dysfunction. Radiographs show bone on bone arthritis medial and patellofemoral. She presents now for left Total Knee Arthroplasty.    Procedure in detail---   The patient is brought into the operating room and positioned supine on the operating table. After successful administration of  Spinal,   a tourniquet is placed high on the  Left thigh(s) and the lower extremity is prepped and draped in the usual sterile fashion. Time out is performed by the operating team and then the  Left lower extremity is wrapped in Esmarch, knee flexed and the tourniquet inflated to 300 mmHg.       A midline incision is made with a ten blade through the subcutaneous tissue to the level of the extensor mechanism. A fresh blade is used to make a medial parapatellar arthrotomy. Soft tissue over the proximal medial tibia is subperiosteally elevated to the joint line with a knife and into the semimembranosus bursa with a Cobb elevator. Soft tissue over the proximal lateral tibia is elevated with attention being  paid to avoiding the patellar tendon on the tibial tubercle. The patella is everted, knee flexed 90 degrees and the ACL and PCL are removed. Findings are bone on bone medial and patellofemoral with medial osteophytes.        The drill is used to create a starting hole in the distal femur and the canal is thoroughly irrigated with sterile saline to remove the fatty contents. The 5 degree Left  valgus alignment guide is placed into the femoral canal and the distal femoral cutting block is pinned to remove 10 mm off the distal femur. Resection is made with an oscillating saw.      The tibia is subluxed forward and the menisci are removed. The extramedullary alignment guide is placed referencing proximally at the medial aspect of the tibial tubercle and distally along the second metatarsal axis and tibial crest. The block is pinned to remove 2mm off the more deficient medial  side. Resection is made with an oscillating saw. Size 2.5is the most appropriate size for the tibia and the proximal tibia is prepared with the modular drill and keel punch for that size.      The femoral sizing guide is placed and size 2.5 is most appropriate. Rotation is marked off the epicondylar axis and confirmed by creating a rectangular flexion gap at 90 degrees. The size 2.5 cutting block is pinned in this rotation and the anterior, posterior and chamfer cuts are made with the oscillating saw. The intercondylar block is then placed and that cut is made.      Trial size 2.5 tibial component, trial size 2.5 posterior  stabilized femur and a 12.5  mm posterior stabilized rotating platform insert trial is placed. Full extension is achieved with excellent varus/valgus and anterior/posterior balance throughout full range of motion. The patella is everted and thickness measured to be 22  mm. Free hand resection is taken to 12 mm, a 38 template is placed, lug holes are drilled, trial patella is placed, and it tracks normally. Osteophytes are  removed off the posterior femur with the trial in place. All trials are removed and the cut bone surfaces prepared with pulsatile lavage. Cement is mixed and once ready for implantation, the size 2.5 tibial implant, size  2.5 posterior stabilized femoral component, and the size 38 patella are cemented in place and the patella is held with the clamp. The trial insert is placed and the knee held in full extension. The Exparel (20 ml mixed with 30 ml saline) and .25% Bupivicaine, are injected into the extensor mechanism, posterior capsule, medial and lateral gutters and subcutaneous tissues.  All extruded cement is removed and once the cement is hard the permanent 12.5 mm posterior stabilized rotating platform insert is placed into the tibial tray.      The wound is copiously irrigated with saline solution and the extensor mechanism closed over a hemovac drain with #1 PDS suture. The tourniquet is released for a total tourniquet time of 36  minutes. Flexion against gravity is 140 degrees and the patella tracks normally. Subcutaneous tissue is closed with 2.0 vicryl and subcuticular with running 4.0 Monocryl. The incision is cleaned and dried and steri-strips and a bulky sterile dressing are applied. The limb is placed into a knee immobilizer and the patient is awakened and transported to recovery in stable condition.      Please note that a surgical assistant was a medical necessity for this procedure in order to perform it in a safe and expeditious manner. Surgical assistant was necessary to retract the ligaments and vital neurovascular structures to prevent injury to them and also necessary for proper positioning of the limb to allow for anatomic placement of the prosthesis.   Gus Rankin Maeby Vankleeck, MD    11/05/2012, 8:19 AM

## 2012-11-05 NOTE — Anesthesia Procedure Notes (Signed)
Spinal  Patient location during procedure: OR Start time: 11/05/2012 7:13 AM End time: 11/05/2012 7:16 AM Staffing Anesthesiologist: Lewie Loron R Performed by: anesthesiologist  Preanesthetic Checklist Completed: patient identified, site marked, surgical consent, pre-op evaluation, timeout performed, IV checked, risks and benefits discussed and monitors and equipment checked Spinal Block Patient position: sitting Prep: Betadine Patient monitoring: heart rate, continuous pulse ox and blood pressure Approach: midline Location: L3-4 Injection technique: single-shot Needle Needle type: Sprotte  Needle gauge: 24 G Needle length: 9 cm Assessment Sensory level: T8 Additional Notes Expiration date of kit checked and confirmed. Patient tolerated procedure well, without complications.

## 2012-11-05 NOTE — Care Management Note (Addendum)
    Page 1 of 1   11/08/2012     3:01:35 PM   CARE MANAGEMENT NOTE 11/08/2012  Patient:  [name]   Account Number:  [id number]  Date Initiated:  11/05/2012  Documentation initiated by:  Colleen Can  Subjective/Objective Assessment:   dx left total knee replacemnt     Action/Plan:   PT/OT pending. CM will follow   Anticipated DC Date:  11/08/2012   Anticipated DC Plan:  HOME W HOME HEALTH SERVICES      DC Planning Services  CM consult      [facility] Choice  HOME HEALTH   Choice offered to / List presented to:  C-1 Patient        HH arranged  HH-2 PT      [facility] agency  [facility]   Status of service:  Completed, signed off Medicare Important Message given?   (If response is "NO", the following Medicare IM given date fields will be blank) Date Medicare IM given:   Date Additional Medicare IM given:    Discharge Disposition:  HOME W HOME HEALTH SERVICES  Per UR Regulation:  Reviewed for med. necessity/level of care/duration of stay  If discussed at Long Length of Stay Meetings, dates discussed:    Comments:  11/06/2012 Colleen Can BSN RN CCM [phone] CM spoke with patient. Current plans are for pt to return to her home in [place] where her cousin will be caregiver. She already has DME-RW and cane. States bathroom has hand rails and high commode seat. She has used [facility] for previous hhpt and wishes to request them again. [facility] rep notified and can provide HHpt services upon discharge.

## 2012-11-06 ENCOUNTER — Encounter (HOSPITAL_COMMUNITY): Payer: Self-pay | Admitting: Orthopedic Surgery

## 2012-11-06 DIAGNOSIS — E871 Hypo-osmolality and hyponatremia: Secondary | ICD-10-CM

## 2012-11-06 LAB — BASIC METABOLIC PANEL
BUN: 25 mg/dL — ABNORMAL HIGH (ref 6–23)
CO2: 21 mEq/L (ref 19–32)
Chloride: 105 mEq/L (ref 96–112)
Glucose, Bld: 116 mg/dL — ABNORMAL HIGH (ref 70–99)
Potassium: 4 mEq/L (ref 3.5–5.1)
Sodium: 134 mEq/L — ABNORMAL LOW (ref 135–145)

## 2012-11-06 LAB — GLUCOSE, CAPILLARY
Glucose-Capillary: 116 mg/dL — ABNORMAL HIGH (ref 70–99)
Glucose-Capillary: 92 mg/dL (ref 70–99)
Glucose-Capillary: 132 mg/dL — ABNORMAL HIGH (ref 70–99)
Glucose-Capillary: 202 mg/dL — ABNORMAL HIGH (ref 70–99)

## 2012-11-06 LAB — CBC
HCT: 34.7 % — ABNORMAL LOW (ref 36.0–46.0)
Hemoglobin: 11.1 g/dL — ABNORMAL LOW (ref 12.0–15.0)
MCHC: 32 g/dL (ref 30.0–36.0)
RBC: 3.72 MIL/uL — ABNORMAL LOW (ref 3.87–5.11)

## 2012-11-06 NOTE — Progress Notes (Signed)
Physical Therapy Treatment Patient Details Name: [name] MRN: [id number] DOB: Aug 09, 1950 Today's Date: 11/06/2012 Time: 4098-1191 PT Time Calculation (min): 31 min  PT Assessment / Plan / Recommendation  History of Present Illness s/p Left TKA   PT Comments   POD # 1 am session.  Pt reports poor sleep last night.  Pt also concerned she can't lift her L leg today and she could yesterday.  Applied KI and instructed pt on use.  Assisted pt OOB to amb in hallway then back to bed to perform TE's.  Pt requested to stay in bed to rest so applied CPM + ICE.   Follow Up Recommendations  Home health PT     Does the patient have the potential to tolerate intense rehabilitation     Barriers to Discharge        Equipment Recommendations       Recommendations for Other Services    Frequency 7X/week   Progress towards PT Goals Progress towards PT goals: Progressing toward goals  Plan      Precautions / Restrictions Precautions Precautions: Knee Precaution Comments: Applied L KI as pt was unable to perform active SLR today Required Braces or Orthoses: Knee Immobilizer - Left Restrictions Weight Bearing Restrictions: No RLE Weight Bearing: Weight bearing as tolerated LLE Weight Bearing: Weight bearing as tolerated    Pertinent Vitals/Pain C/o 5/10 pain with act ICE applied    Mobility  Bed Mobility Bed Mobility: Sit to Supine;Supine to Sit Supine to Sit: 4: Min assist Sit to Supine: 4: Min assist Details for Bed Mobility Assistance: min with LLE and increased time Transfers Transfers: Sit to Stand;Stand to Sit Sit to Stand: 4: Min assist;From bed;4: Min guard Stand to Sit: 4: Min assist;To chair/3-in-1;4: Min guard Details for Transfer Assistance: 25% VC's on proper hand placement and increased time due to mild c/o nausea Ambulation/Gait Ambulation/Gait Assistance: 4: Min guard Ambulation Distance (Feet): 185 Feet Assistive device: Rolling walker Ambulation/Gait  Assistance Details: increased time Gait Pattern: Step-to pattern;Step-through pattern    Exercises   Total Knee Replacement TE's 10 reps B LE ankle pumps 10 reps knee presses 10 reps heel slides  10 reps SAQ's 10 reps SLR's 10 reps ABD Followed by ICE    PT Goals (current goals can now be found in the care plan section)    Visit Information  Last PT Received On: 11/06/12 Assistance Needed: +1 History of Present Illness: s/p Left TKA    Subjective Data      Cognition       Balance     End of Session PT - End of Session Equipment Utilized During Treatment: Gait belt;Left knee immobilizer Activity Tolerance: Patient tolerated treatment well Patient left: in bed;with call bell/phone within reach Nurse Communication: Mobility status CPM Left Knee CPM Left Knee: Off   [name]  PTA [facility]  Acute  Rehab Pager      [phone]

## 2012-11-06 NOTE — Progress Notes (Signed)
11/06/2012 Colleen Can BSN RN CCM [phone] CM spoke with patient. Current plans are for pt to return to her home in [place] where her cousin will be caregiver. She already has DME-RW and cane. States bathroom has hand rails and high commode seat. She has used [facility] for previous hhpt and wishes to request them again. [facility] rep notified and can provide HHpt services upon discharge.

## 2012-11-06 NOTE — Progress Notes (Signed)
PM PT Session Cancellation Note  ___Treatment cancelled today due to medical issues with patient which prohibited   therapy  ___ Treatment cancelled today due to patient receiving procedure or test   ___ Treatment cancelled today due to patient's refusal to participate   _X_ Treatment cancelled today due to max c/o nausea and requested to rest due to poor sleep last night. Will see in AM.  [name]  PTA [facility]  Acute  Rehab Pager      [phone]

## 2012-11-06 NOTE — Progress Notes (Signed)
OT Cancellation Note  Patient Details Name: [name] MRN: [id number] DOB: 26-Oct-1950   Cancelled Treatment:    Reason Eval/Treat Not Completed: OT screened, no needs identified, will sign off  Lennox Laity [phone] 11/06/2012, 12:28 PM

## 2012-11-06 NOTE — Progress Notes (Signed)
   Subjective: 1 Day Post-Op Procedure(s) (LRB): LEFT TOTAL KNEE ARTHROPLASTY (Left) Patient reports pain as mild.   Patient seen in rounds with Dr. Lequita Halt. Patient is well, and has had no acute complaints or problems We will start therapy today.  Plan is to go Home after hospital stay.  Objective: Vital signs in last 24 hours: Temp:  [97.7 F (36.5 C)-99.1 F (37.3 C)] 98.4 F (36.9 C) (08/26 0623) Pulse Rate:  [67-74] 71 (08/26 0623) Resp:  [15-22] 15 (08/26 0800) BP: (106-156)/(50-78) 120/67 mmHg (08/26 0623) SpO2:  [97 %-100 %] 100 % (08/26 0623) Weight:  [109.317 kg (241 lb)] 109.317 kg (241 lb) (08/25 1030)  Intake/Output from previous day:  Intake/Output Summary (Last 24 hours) at 11/06/12 0828 Last data filed at 11/06/12 1610  Gross per 24 hour  Intake 3134.5 ml  Output   3985 ml  Net -850.5 ml    Intake/Output this shift:    Labs:  Recent Labs  11/06/12 0420  HGB 11.1*    Recent Labs  11/06/12 0420  WBC 8.8  RBC 3.72*  HCT 34.7*  PLT 214    Recent Labs  11/06/12 0420  NA 134*  K 4.0  CL 105  CO2 21  BUN 25*  CREATININE 1.13*  GLUCOSE 116*  CALCIUM 8.4   No results found for this basename: LABPT, INR,  in the last 72 hours  EXAM General - Patient is Alert, Appropriate and Oriented Extremity - Neurovascular intact Sensation intact distally Dorsiflexion/Plantar flexion intact Dressing - dressing C/D/I Motor Function - intact, moving foot and toes well on exam.  Hemovac pulled without difficulty.  Past Medical History  Diagnosis Date  . Hypertension   . Arthritis   . History of kidney stones   . 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 Pinched nerve in neck   . Knee pain, right   . Swelling of knee joint, right   . Hyperlipidemia   . Hypothyroidism   . Diabetes mellitus ORAL MEDS  . Synovitis of knee RIGHT  . OSA on CPAP   . Osteoarthritis   . Depression   . GERD (gastroesophageal reflux disease)     occasional   . Pneumonia     hx of  . Anemia     hx of  . PONV (postoperative nausea and vomiting)     for 3-4 days after general anesthesia    Assessment/Plan: 1 Day Post-Op Procedure(s) (LRB): LEFT TOTAL KNEE ARTHROPLASTY (Left) Principal Problem:   OA (osteoarthritis) of knee  Estimated body mass index is 44.07 kg/(m^2) as calculated from the following:   Height as of this encounter: 5\' 2"  (1.575 m).   Weight as of this encounter: 109.317 kg (241 lb). Advance diet Up with therapy D/C IV fluids Plan for discharge tomorrow Discharge home with home health  DVT Prophylaxis - Xarelto Weight-Bearing as tolerated to left leg No vaccines. D/C O2 and Pulse OX and try on Room [address]  Patrica Duel 11/06/2012, 8:28 AM

## 2012-11-07 LAB — GLUCOSE, CAPILLARY
Glucose-Capillary: 139 mg/dL — ABNORMAL HIGH (ref 70–99)
Glucose-Capillary: 167 mg/dL — ABNORMAL HIGH (ref 70–99)
Glucose-Capillary: 144 mg/dL — ABNORMAL HIGH (ref 70–99)
Glucose-Capillary: 197 mg/dL — ABNORMAL HIGH (ref 70–99)

## 2012-11-07 LAB — BASIC METABOLIC PANEL
Calcium: 9.2 mg/dL (ref 8.4–10.5)
GFR calc Af Amer: 64 mL/min — ABNORMAL LOW (ref >90)
GFR calc non Af Amer: 55 mL/min — ABNORMAL LOW (ref >90)
Glucose, Bld: 179 mg/dL — ABNORMAL HIGH (ref 70–99)
Potassium: 4.3 mEq/L (ref 3.5–5.1)
Sodium: 134 mEq/L — ABNORMAL LOW (ref 135–145)

## 2012-11-07 LAB — CBC
Hemoglobin: 11.9 g/dL — ABNORMAL LOW (ref 12.0–15.0)
MCH: 30.4 pg (ref 26.0–34.0)
MCHC: 32.9 g/dL (ref 30.0–36.0)
Platelets: 247 10*3/uL (ref 150–400)
RDW: 13.6 % (ref 11.5–15.5)

## 2012-11-07 MED ORDER — TRAMADOL HCL 50 MG PO TABS: 50.0000 mg | ORAL_TABLET | Freq: Four times a day (QID) | ORAL | Status: DC | PRN

## 2012-11-07 MED ORDER — METHOCARBAMOL 500 MG PO TABS: 500.0000 mg | ORAL_TABLET | Freq: Four times a day (QID) | ORAL | Status: DC | PRN

## 2012-11-07 MED ORDER — RIVAROXABAN 10 MG PO TABS: 10.0000 mg | ORAL_TABLET | Freq: Every day | ORAL | Status: DC

## 2012-11-07 MED ORDER — OXYCODONE HCL 5 MG PO TABS: 5.0000 mg | ORAL_TABLET | ORAL | Status: DC | PRN

## 2012-11-07 NOTE — Progress Notes (Signed)
   Subjective: 2 Days Post-Op Procedure(s) (LRB): LEFT TOTAL KNEE ARTHROPLASTY (Left) Patient reports pain as mild.   Patient seen in rounds with Dr. Lequita Halt. Patient is well, and has had no acute complaints or problems Patient is ready to go home later today if meets her goals.  Objective: Vital signs in last 24 hours: Temp:  [97.3 F (36.3 C)-98.6 F (37 C)] 98.6 F (37 C) (08/27 0600) Pulse Rate:  [71-83] 71 (08/27 0600) Resp:  [15-20] 18 (08/27 0600) BP: (161-169)/(66-74) 161/69 mmHg (08/27 0600) SpO2:  [95 %-100 %] 96 % (08/27 0600)  Intake/Output from previous day:  Intake/Output Summary (Last 24 hours) at 11/07/12 0945 Last data filed at 11/07/12 0981  Gross per 24 hour  Intake    720 ml  Output      0 ml  Net    720 ml    Intake/Output this shift: Total I/O In: 240 [P.O.:240] Out: -   Labs:  Recent Labs  11/06/12 0420 11/07/12 0453  HGB 11.1* 11.9*    Recent Labs  11/06/12 0420 11/07/12 0453  WBC 8.8 12.6*  RBC 3.72* 3.91  HCT 34.7* 36.2  PLT 214 247    Recent Labs  11/06/12 0420 11/07/12 0453  NA 134* 134*  K 4.0 4.3  CL 105 100  CO2 21 25  BUN 25* 23  CREATININE 1.13* 1.05  GLUCOSE 116* 179*  CALCIUM 8.4 9.2   No results found for this basename: LABPT, INR,  in the last 72 hours  EXAM: General - Patient is Alert, Appropriate and Oriented Extremity - Neurovascular intact Sensation intact distally Incision - clean, dry, no drainage Motor Function - intact, moving foot and toes well on exam.   Assessment/Plan: 2 Days Post-Op Procedure(s) (LRB): LEFT TOTAL KNEE ARTHROPLASTY (Left) Procedure(s) (LRB): LEFT TOTAL KNEE ARTHROPLASTY (Left) Past Medical History  Diagnosis Date  . Hypertension   . Arthritis   . History of kidney stones   . 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 Pinched nerve in neck   . Knee pain, right   . Swelling of knee joint, right   . Hyperlipidemia   . Hypothyroidism   . Diabetes mellitus  ORAL MEDS  . Synovitis of knee RIGHT  . OSA on CPAP   . Osteoarthritis   . Depression   . GERD (gastroesophageal reflux disease)     occasional  . Pneumonia     hx of  . Anemia     hx of  . PONV (postoperative nausea and vomiting)     for 3-4 days after general anesthesia   Principal Problem:   OA (osteoarthritis) of knee Active Problems:   Postoperative anemia due to acute blood loss   Hyponatremia  Estimated body mass index is 44.07 kg/(m^2) as calculated from the following:   Height as of this encounter: 5\' 2"  (1.575 m).   Weight as of this encounter: 109.317 kg (241 lb). Discharge home with home health Diet - Cardiac diet and Diabetic diet Follow up - in 2 weeks Activity - WBAT Disposition - Home Condition Upon Discharge - Good D/C Meds - See DC Summary DVT Prophylaxis - Xarelto  PERKINS, ALEXZANDREW 11/07/2012, 9:45 AM

## 2012-11-07 NOTE — Progress Notes (Signed)
Physical Therapy Treatment Patient Details Name: [name] MRN: [id number] DOB: 04-12-1950 Today's Date: 11/07/2012 Time: 1022-1100 PT Time Calculation (min): 38 min  PT Assessment / Plan / Recommendation  History of Present Illness s/p Left TKA   PT Comments   POD # 2 am session.  Pt feeling much better after her afternoon rest yesterday. Assisted OOB to amb in hallway then performed TE's.  Pt plans to D/C to home after another PT session.   Follow Up Recommendations  Home health PT     Does the patient have the potential to tolerate intense rehabilitation     Barriers to Discharge        Equipment Recommendations       Recommendations for Other Services    Frequency 7X/week   Progress towards PT Goals    Plan      Precautions / Restrictions Precautions Precautions: Knee Precaution Comments: Applied L KI as pt was unable to perform active SLR today Required Braces or Orthoses: Knee Immobilizer - Left Restrictions Weight Bearing Restrictions: No LLE Weight Bearing: Weight bearing as tolerated    Pertinent Vitals/Pain C/o 6/10 pain with act ICE applied    Mobility  Bed Mobility Bed Mobility: Sit to Supine;Supine to Sit Supine to Sit: 5: Supervision Sit to Supine: 5: Supervision Details for Bed Mobility Assistance: pt grasps her sock of L LE to assist on/off bed Transfers Transfers: Sit to Stand;Stand to Sit Sit to Stand: 5: Supervision;From bed Stand to Sit: 5: Supervision;To bed Details for Transfer Assistance: increased time, doos safety tech Ambulation/Gait Ambulation/Gait Assistance: 4: Min guard;5: Supervision Ambulation Distance (Feet): 215 Feet Assistive device: Rolling walker Ambulation/Gait Assistance Details: increased time Gait Pattern: Step-to pattern;Step-through pattern Gait velocity: decreased    Exercises   Total Knee Replacement TE's 10 reps B LE ankle pumps 10 reps knee presses 10 reps heel slides  10 reps SAQ's 10 reps SLR's 10  reps ABD Followed by ICE    PT Goals (current goals can now be found in the care plan section)    Visit Information  Last PT Received On: 11/07/12 Assistance Needed: +1 History of Present Illness: s/p Left TKA    Subjective Data      Cognition       Balance     End of Session PT - End of Session Equipment Utilized During Treatment: Gait belt;Left knee immobilizer Activity Tolerance: Patient tolerated treatment well Patient left: in bed;with call bell/phone within reach CPM Left Knee CPM Left Knee: Off   [name]  PTA [facility]  Acute  Rehab Pager      [phone]

## 2012-11-07 NOTE — Progress Notes (Signed)
Physical Therapy Treatment Patient Details Name: [name] MRN: [id number] DOB: 06-27-1950 Today's Date: 11/07/2012 Time: 7829-5621 PT Time Calculation (min): 29 min  PT Assessment / Plan / Recommendation  History of Present Illness s/p Left TKA   PT Comments   POD # 2 pm session.  Pt c/o increased pain with difficulty to control despite current meds.  Assisted pt OOB to amb in hallway then practice going up/down 4 inch steps.    Follow Up Recommendations  Home health PT     Does the patient have the potential to tolerate intense rehabilitation     Barriers to Discharge        Equipment Recommendations       Recommendations for Other Services    Frequency 7X/week   Progress towards PT Goals Progress towards PT goals: Progressing toward goals  Plan      Precautions / Restrictions Precautions Precautions: Knee Precaution Comments: Applied L KI as pt was unable to perform active SLR today and c/o increased pain Required Braces or Orthoses: Knee Immobilizer - Left Restrictions Weight Bearing Restrictions: No LLE Weight Bearing: Weight bearing as tolerated    Pertinent Vitals/Pain C/o 8/10 pain Pre medicated ICE applied    Mobility  Bed Mobility Bed Mobility: Sit to Supine;Supine to Sit Supine to Sit: 5: Supervision Sit to Supine: 5: Supervision Details for Bed Mobility Assistance: pt used a long bed sheet to assist L LE on/off bed Transfers Transfers: Sit to Stand;Stand to Sit Sit to Stand: 5: Supervision;From bed Stand to Sit: 5: Supervision;To bed Details for Transfer Assistance: increased time and increased c/o pain Ambulation/Gait Ambulation/Gait Assistance: 4: Min guard;5: Supervision Ambulation Distance (Feet): 55 Feet Assistive device: Rolling walker Ambulation/Gait Assistance Details: decreased amb distance this pm 2nd increased c/o pain and overall "not feeling well". Gait Pattern: Step-to pattern;Step-through pattern Gait velocity:  decreased Stairs: Yes Stairs Assistance: 4: Min guard;4: Min assist Stairs Assistance Details (indicate cue type and reason): up 4 inch forward using RW and 25% VC's on proper sequencing Stair Management Technique: No rails    PT Goals (current goals can now be found in the care plan section)    Visit Information  Last PT Received On: 11/07/12 Assistance Needed: +1 History of Present Illness: s/p Left TKA    Subjective Data      Cognition       Balance     End of Session PT - End of Session Equipment Utilized During Treatment: Gait belt;Left knee immobilizer Activity Tolerance: Patient tolerated treatment well Patient left: in bed;with call bell/phone within reach   [name]  PTA [facility]  Acute  Rehab Pager      [phone]

## 2012-11-08 LAB — CBC
Hemoglobin: 10.8 g/dL — ABNORMAL LOW (ref 12.0–15.0)
MCH: 29.9 pg (ref 26.0–34.0)
Platelets: 266 10*3/uL (ref 150–400)
RBC: 3.61 MIL/uL — ABNORMAL LOW (ref 3.87–5.11)
WBC: 12.9 10*3/uL — ABNORMAL HIGH (ref 4.0–10.5)

## 2012-11-08 LAB — GLUCOSE, CAPILLARY
Glucose-Capillary: 97 mg/dL (ref 70–99)
Glucose-Capillary: 148 mg/dL — ABNORMAL HIGH (ref 70–99)

## 2012-11-08 NOTE — Progress Notes (Signed)
Discharge summary sent to payer through [facility]  

## 2012-11-08 NOTE — Progress Notes (Signed)
   Subjective: 3 Days Post-Op Procedure(s) (LRB): LEFT TOTAL KNEE ARTHROPLASTY (Left) Patient reports pain as mild.   Patient seen in rounds with Dr. Lequita Halt. Patient is well, and has had no acute complaints or problems Patient is ready to go home  Objective: Vital signs in last 24 hours: Temp:  [98.5 F (36.9 C)-98.9 F (37.2 C)] 98.5 F (36.9 C) (08/28 0518) Pulse Rate:  [74-76] 76 (08/28 0518) Resp:  [14-16] 14 (08/28 0518) BP: (117-150)/(61-78) 129/73 mmHg (08/28 0518) SpO2:  [94 %-98 %] 94 % (08/28 0518)  Intake/Output from previous day:  Intake/Output Summary (Last 24 hours) at 11/08/12 1610 Last data filed at 11/07/12 1800  Gross per 24 hour  Intake    480 ml  Output   2050 ml  Net  -1570 ml    Intake/Output this shift:    Labs:  Recent Labs  11/06/12 0420 11/07/12 0453 11/08/12 0455  HGB 11.1* 11.9* 10.8*    Recent Labs  11/07/12 0453 11/08/12 0455  WBC 12.6* 12.9*  RBC 3.91 3.61*  HCT 36.2 33.6*  PLT 247 266    Recent Labs  11/06/12 0420 11/07/12 0453  NA 134* 134*  K 4.0 4.3  CL 105 100  CO2 21 25  BUN 25* 23  CREATININE 1.13* 1.05  GLUCOSE 116* 179*  CALCIUM 8.4 9.2   No results found for this basename: LABPT, INR,  in the last 72 hours  EXAM: General - Patient is Alert, Appropriate and Oriented Extremity - Neurovascular intact Sensation intact distally Dorsiflexion/Plantar flexion intact Incision - clean, dry, no drainage, healing Motor Function - intact, moving foot and toes well on exam.   Assessment/Plan: 3 Days Post-Op Procedure(s) (LRB): LEFT TOTAL KNEE ARTHROPLASTY (Left) Procedure(s) (LRB): LEFT TOTAL KNEE ARTHROPLASTY (Left) Past Medical History  Diagnosis Date  . Hypertension   . Arthritis   . History of kidney stones   . 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 Pinched nerve in neck   . Knee pain, right   . Swelling of knee joint, right   . Hyperlipidemia   . Hypothyroidism   . Diabetes mellitus  ORAL MEDS  . Synovitis of knee RIGHT  . OSA on CPAP   . Osteoarthritis   . Depression   . GERD (gastroesophageal reflux disease)     occasional  . Pneumonia     hx of  . Anemia     hx of  . PONV (postoperative nausea and vomiting)     for 3-4 days after general anesthesia   Principal Problem:   OA (osteoarthritis) of knee Active Problems:   Postoperative anemia due to acute blood loss   Hyponatremia  Estimated body mass index is 44.07 kg/(m^2) as calculated from the following:   Height as of this encounter: 5\' 2"  (1.575 m).   Weight as of this encounter: 109.317 kg (241 lb). Up with therapy Discharge home with home health Diet - Cardiac diet and Diabetic diet Follow up - in 2 weeks Activity - WBAT Disposition - Home Condition Upon Discharge - Good D/C Meds - See DC Summary DVT Prophylaxis - Xarelto  [name] 11/08/2012, 8:22 AM

## 2012-11-08 NOTE — Progress Notes (Signed)
Physical Therapy Treatment Patient Details Name: [name] MRN: [id number] DOB: Oct 05, 1950 Today's Date: 11/08/2012 Time: 4098-1191 PT Time Calculation (min): 28 min  PT Assessment / Plan / Recommendation  History of Present Illness s/p Left TKA   PT Comments   Pt  Doing well today, feeling better overall; Plan is for D/C home with HHPT  Follow Up Recommendations  Home health PT     Does the patient have the potential to tolerate intense rehabilitation     Barriers to Discharge        Equipment Recommendations  None recommended by PT    Recommendations for Other Services    Frequency     Progress towards PT Goals Progress towards PT goals: Progressing toward goals  Plan Current plan remains appropriate    Precautions / Restrictions Precautions Precautions: Knee Precaution Comments: Applied L KI as pt was unable to perform active SLR today and c/o increased pain Required Braces or Orthoses: Knee Immobilizer - Left Knee Immobilizer - Left: Discontinue once straight leg raise with < 10 degree lag Restrictions LLE Weight Bearing: Weight bearing as tolerated   Pertinent Vitals/Pain     Mobility  Bed Mobility Bed Mobility: Supine to Sit Supine to Sit: 6: Modified independent (Device/Increase time) Details for Bed Mobility Assistance: pt used a long bed sheet to assist L LE off  bed Transfers Transfers: Sit to Stand;Stand to Sit Sit to Stand: 6: Modified independent (Device/Increase time) Stand to Sit: 6: Modified independent (Device/Increase time) Ambulation/Gait Ambulation/Gait Assistance: 5: Supervision;6: Modified independent (Device/Increase time) Ambulation Distance (Feet): 200 Feet Assistive device: Rolling walker Ambulation/Gait Assistance Details: pt able to demo step through gait with good walker safety and stability Gait Pattern: Step-to pattern;Step-through pattern;Decreased stride length;Decreased hip/knee flexion - left    Exercises Total Joint  Exercises Ankle Circles/Pumps: AROM;Both;10 reps Quad Sets: AROM;Both;10 reps Heel Slides: AROM;AAROM;Left;10 reps Hip ABduction/ADduction: AAROM;Left;10 reps Straight Leg Raises: AAROM;Left;10 reps   PT Diagnosis:    PT Problem List:   PT Treatment Interventions:     PT Goals (current goals can now be found in the care plan section) Acute Rehab PT Goals Patient Stated Goal: decr pain Time For Goal Achievement: 11/12/12 Potential to Achieve Goals: Good  Visit Information  Last PT Received On: 11/08/12 Assistance Needed: +1 History of Present Illness: s/p Left TKA    Subjective Data  Patient Stated Goal: decr pain   Cognition  Cognition Arousal/Alertness: Awake/alert Behavior During Therapy: WFL for tasks assessed/performed Overall Cognitive Status: Within Functional Limits for tasks assessed    Balance     End of Session PT - End of Session Equipment Utilized During Treatment: Left knee immobilizer Activity Tolerance: Patient tolerated treatment well Patient left: in chair;with call bell/phone within reach Nurse Communication: Mobility status   GP     [facility] 11/08/2012, 11:59 AM

## 2012-11-08 NOTE — Progress Notes (Signed)
Spoke with patient at bedside. She is currently active with the [facility] program for DM management. She sees the Pharmacist with the [facility] program. Reports she is going home today and will have [facility] for [facility] services. Declines the need for a post hospital follow-up discharge call. Left contact information with Mrs George to call if this writer can be of any assistance in the future.  Raiford Noble, MSN- Ed, RN,BSN- [facility] [phone]

## 2012-11-22 DIAGNOSIS — D62 Acute posthemorrhagic anemia: Secondary | ICD-10-CM

## 2012-11-22 NOTE — Discharge Summary (Signed)
Physician Discharge Summary   Patient ID: [name] MRN: [id number] DOB/AGE: [birth year] 62 y.o.  Admit date: 11/05/2012 Discharge date: 11/08/2012  Primary Diagnosis:   Admission Diagnoses:  Past Medical History  Diagnosis Date  . Hypertension   . Arthritis   . History of kidney stones   . 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 Pinched nerve in neck   . Knee pain, right   . Swelling of knee joint, right   . Hyperlipidemia   . Hypothyroidism   . Diabetes mellitus ORAL MEDS  . Synovitis of knee RIGHT  . OSA on CPAP   . Osteoarthritis   . Depression   . GERD (gastroesophageal reflux disease)     occasional  . Pneumonia     hx of  . Anemia     hx of  . PONV (postoperative nausea and vomiting)     for 3-4 days after general anesthesia   Discharge Diagnoses:   Principal Problem:   OA (osteoarthritis) of knee Active Problems:   Hyponatremia   Postoperative anemia due to acute blood loss  Estimated body mass index is 44.07 kg/(m^2) as calculated from the following:   Height as of this encounter: 5\' 2"  (1.575 m).   Weight as of this encounter: 109.317 kg (241 lb).  Procedure:  Procedure(s) (LRB): LEFT TOTAL KNEE ARTHROPLASTY (Left)   Consults: None  HPI: [name] is a 62 y.o. year old female with end stage OA of her left knee with progressively worsening pain and dysfunction. She has constant pain, with activity and at rest and significant functional deficits with difficulties even with ADLs. She has had extensive non-op management including analgesics, injections of cortisone and viscosupplements, and home exercise program, but remains in significant pain with significant dysfunction. Radiographs show bone on bone arthritis medial and patellofemoral. She presents now for left Total Knee Arthroplasty.   Laboratory Data: Admission on 11/05/2012, Discharged on 11/08/2012  Component Date Value Range Status  . Glucose-Capillary 11/05/2012 94  70 - 99  mg/dL Final  . Glucose-Capillary 11/05/2012 77  70 - 99 mg/dL Final  . WBC 40/98/1191 8.8  4.0 - 10.5 K/uL Final  . RBC 11/06/2012 3.72* 3.87 - 5.11 MIL/uL Final  . Hemoglobin 11/06/2012 11.1* 12.0 - 15.0 g/dL Final  . HCT 47/82/9562 34.7* 36.0 - 46.0 % Final  . MCV 11/06/2012 93.3  78.0 - 100.0 fL Final  . MCH 11/06/2012 29.8  26.0 - 34.0 pg Final  . MCHC 11/06/2012 32.0  30.0 - 36.0 g/dL Final  . RDW 13/10/6576 13.8  11.5 - 15.5 % Final  . Platelets 11/06/2012 214  150 - 400 K/uL Final  . Sodium 11/06/2012 134* 135 - 145 mEq/L Final  . Potassium 11/06/2012 4.0  3.5 - 5.1 mEq/L Final  . Chloride 11/06/2012 105  96 - 112 mEq/L Final  . CO2 11/06/2012 21  19 - 32 mEq/L Final  . Glucose, Bld 11/06/2012 116* 70 - 99 mg/dL Final  . BUN 46/96/2952 25* 6 - 23 mg/dL Final  . Creatinine, Ser 11/06/2012 1.13* 0.50 - 1.10 mg/dL Final  . Calcium 84/13/2440 8.4  8.4 - 10.5 mg/dL Final  . GFR calc non Af Amer 11/06/2012 51* >90 mL/min Final  . GFR calc Af Amer 11/06/2012 59* >90 mL/min Final   Comment: (NOTE)                          The  eGFR has been calculated using the CKD EPI equation.                          This calculation has not been validated in all clinical situations.                          eGFR's persistently <90 mL/min signify possible Chronic Kidney                          Disease.  . Glucose-Capillary 11/05/2012 67* 70 - 99 mg/dL Final  . Glucose-Capillary 11/05/2012 85  70 - 99 mg/dL Final  . Glucose-Capillary 11/05/2012 128* 70 - 99 mg/dL Final  . Glucose-Capillary 11/06/2012 116* 70 - 99 mg/dL Final  . Glucose-Capillary 11/06/2012 92  70 - 99 mg/dL Final  . Comment 1 91/47/8295 Notify RN   Final  . Glucose-Capillary 11/06/2012 132* 70 - 99 mg/dL Final  . WBC 62/13/0865 12.6* 4.0 - 10.5 K/uL Final  . RBC 11/07/2012 3.91  3.87 - 5.11 MIL/uL Final  . Hemoglobin 11/07/2012 11.9* 12.0 - 15.0 g/dL Final  . HCT 78/46/9629 36.2  36.0 - 46.0 % Final  . MCV 11/07/2012 92.6  78.0  - 100.0 fL Final  . MCH 11/07/2012 30.4  26.0 - 34.0 pg Final  . MCHC 11/07/2012 32.9  30.0 - 36.0 g/dL Final  . RDW 52/84/1324 13.6  11.5 - 15.5 % Final  . Platelets 11/07/2012 247  150 - 400 K/uL Final  . Sodium 11/07/2012 134* 135 - 145 mEq/L Final  . Potassium 11/07/2012 4.3  3.5 - 5.1 mEq/L Final  . Chloride 11/07/2012 100  96 - 112 mEq/L Final  . CO2 11/07/2012 25  19 - 32 mEq/L Final  . Glucose, Bld 11/07/2012 179* 70 - 99 mg/dL Final  . BUN 40/12/2723 23  6 - 23 mg/dL Final  . Creatinine, Ser 11/07/2012 1.05  0.50 - 1.10 mg/dL Final  . Calcium 36/64/4034 9.2  8.4 - 10.5 mg/dL Final  . GFR calc non Af Amer 11/07/2012 55* >90 mL/min Final  . GFR calc Af Amer 11/07/2012 64* >90 mL/min Final   Comment: (NOTE)                          The eGFR has been calculated using the CKD EPI equation.                          This calculation has not been validated in all clinical situations.                          eGFR's persistently <90 mL/min signify possible Chronic Kidney                          Disease.  . Glucose-Capillary 11/06/2012 133* 70 - 99 mg/dL Final  . Glucose-Capillary 11/06/2012 202* 70 - 99 mg/dL Final  . Comment 1 74/25/9563 Notify RN   Final  . Glucose-Capillary 11/07/2012 139* 70 - 99 mg/dL Final  . Glucose-Capillary 11/07/2012 167* 70 - 99 mg/dL Final  . WBC 87/56/4332 12.9* 4.0 - 10.5 K/uL Final  . RBC 11/08/2012 3.61* 3.87 - 5.11 MIL/uL Final  . Hemoglobin 11/08/2012 10.8* 12.0 - 15.0 g/dL Final  . HCT  11/08/2012 33.6* 36.0 - 46.0 % Final  . MCV 11/08/2012 93.1  78.0 - 100.0 fL Final  . MCH 11/08/2012 29.9  26.0 - 34.0 pg Final  . MCHC 11/08/2012 32.1  30.0 - 36.0 g/dL Final  . RDW 16/12/9602 14.0  11.5 - 15.5 % Final  . Platelets 11/08/2012 266  150 - 400 K/uL Final  . Glucose-Capillary 11/07/2012 144* 70 - 99 mg/dL Final  . Glucose-Capillary 11/07/2012 197* 70 - 99 mg/dL Final  . Glucose-Capillary 11/08/2012 97  70 - 99 mg/dL Final  . Glucose-Capillary  11/08/2012 148* 70 - 99 mg/dL Final  . Comment 1 54/11/8117 Documented in Chart   Final  . Comment 2 11/08/2012 Notify RN   Final  Hospital Outpatient Visit on 10/29/2012  Component Date Value Range Status  . MRSA, PCR 10/29/2012 NEGATIVE  NEGATIVE Final  . Staphylococcus aureus 10/29/2012 NEGATIVE  NEGATIVE Final   Comment:                                 The Xpert SA Assay (FDA                          approved for NASAL specimens                          in patients over 91 years of age),                          is one component of                          a comprehensive surveillance                          program.  Test performance has                          been validated by [employer] for patients greater                          than or equal to 46 year old.                          It is not intended                          to diagnose infection nor to                          guide or monitor treatment.  [name] aPTT 10/29/2012 32  24 - 37 seconds Final  . WBC 10/29/2012 9.8  4.0 - 10.5 K/uL Final  . RBC 10/29/2012 4.46  3.87 - 5.11 MIL/uL Final  . Hemoglobin 10/29/2012 13.5  12.0 - 15.0 g/dL Final  . HCT 14/78/2956 42.0  36.0 - 46.0 % Final  . MCV 10/29/2012 94.2  78.0 - 100.0 fL Final  . MCH 10/29/2012 30.3  26.0 - 34.0 pg Final  .  MCHC 10/29/2012 32.1  30.0 - 36.0 g/dL Final  . RDW 16/12/9602 14.0  11.5 - 15.5 % Final  . Platelets 10/29/2012 298  150 - 400 K/uL Final  . Sodium 10/29/2012 135  135 - 145 mEq/L Final  . Potassium 10/29/2012 3.8  3.5 - 5.1 mEq/L Final  . Chloride 10/29/2012 99  96 - 112 mEq/L Final  . CO2 10/29/2012 24  19 - 32 mEq/L Final  . Glucose, Bld 10/29/2012 77  70 - 99 mg/dL Final  . BUN 54/11/8117 41* 6 - 23 mg/dL Final  . Creatinine, Ser 10/29/2012 1.38* 0.50 - 1.10 mg/dL Final  . Calcium 14/78/2956 10.1  8.4 - 10.5 mg/dL Final  . Total Protein 10/29/2012 8.2  6.0 - 8.3 g/dL Final  . Albumin 21/30/8657 4.1  3.5 -  5.2 g/dL Final  . AST 84/69/6295 19  0 - 37 U/L Final  . ALT 10/29/2012 15  0 - 35 U/L Final  . Alkaline Phosphatase 10/29/2012 73  39 - 117 U/L Final  . Total Bilirubin 10/29/2012 0.3  0.3 - 1.2 mg/dL Final  . GFR calc non Af Amer 10/29/2012 40* >90 mL/min Final  . GFR calc Af Amer 10/29/2012 46* >90 mL/min Final   Comment: (NOTE)                          The eGFR has been calculated using the CKD EPI equation.                          This calculation has not been validated in all clinical situations.                          eGFR's persistently <90 mL/min signify possible Chronic Kidney                          Disease.  [name] Prothrombin Time 10/29/2012 12.6  11.6 - 15.2 seconds Final  . INR 10/29/2012 0.96  0.00 - 1.49 Final  . Color, Urine 10/29/2012 YELLOW  YELLOW Final  . APPearance 10/29/2012 CLOUDY* CLEAR Final  . Specific Gravity, Urine 10/29/2012 1.025  1.005 - 1.030 Final  . pH 10/29/2012 5.0  5.0 - 8.0 Final  . Glucose, UA 10/29/2012 250* NEGATIVE mg/dL Final  . Hgb urine dipstick 10/29/2012 TRACE* NEGATIVE Final  . Bilirubin Urine 10/29/2012 NEGATIVE  NEGATIVE Final  . Ketones, ur 10/29/2012 NEGATIVE  NEGATIVE mg/dL Final  . Protein, ur 28/41/3244 NEGATIVE  NEGATIVE mg/dL Final  . Urobilinogen, UA 10/29/2012 1.0  0.0 - 1.0 mg/dL Final  . Nitrite 03/16/7251 NEGATIVE  NEGATIVE Final  . Leukocytes, UA 10/29/2012 LARGE* NEGATIVE Final  . ABO/RH(D) 10/29/2012 A POS   Final  . Antibody Screen 10/29/2012 NEG   Final  . Sample Expiration 10/29/2012 11/08/2012   Final  . WBC, UA 10/29/2012 TOO NUMEROUS TO COUNT  <3 WBC/hpf Final  . RBC / HPF 10/29/2012 0-2  <3 RBC/hpf Final  . Bacteria, UA 10/29/2012 MANY* RARE Final  . Specimen Description 10/29/2012 URINE, CLEAN CATCH   Final  . Special Requests 10/29/2012 NONE   Final  . Culture  Setup Time 10/29/2012    Final                   Value:10/29/2012 22:27  Performed at [facility]  . Colony Count  10/29/2012    Final                   Value:NO GROWTH                         Performed at [facility]  . Culture 10/29/2012    Final                   Value:NO GROWTH                         Performed at [facility]  . Report Status 10/29/2012 10/30/2012 FINAL   Final     X-Rays:Dg Chest 2 View  10/29/2012   *RADIOLOGY REPORT*  Clinical Data: Hypertension, diabetes.  CHEST - 2 VIEW  Comparison: August 25, 2009.  Findings: Cardiomediastinal silhouette appears normal.  No acute pulmonary disease is noted.  Bony thorax is intact.  IMPRESSION: No acute cardiopulmonary abnormality seen.   Original Report Authenticated By: Lupita Raider.,  [name]    EKG: Orders placed in visit on 04/20/11  . EKG 12-LEAD     Hospital Course: [name] is a 62 y.o. who was admitted to [facility]. They were brought to the operating room on 11/05/2012 and underwent Procedure(s): LEFT TOTAL KNEE ARTHROPLASTY.  Patient tolerated the procedure well and was later transferred to the recovery room and then to the orthopaedic floor for postoperative care.  They were given PO and IV analgesics for pain control following their surgery.  They were given 24 hours of postoperative antibiotics of  Anti-infectives   Start     Dose/Rate Route Frequency Ordered Stop   11/05/12 1300  ceFAZolin (ANCEF) IVPB 1 g/50 mL premix     1 g 100 mL/hr over 30 Minutes Intravenous Every 6 hours 11/05/12 1033 11/05/12 1919   11/05/12 0600  ceFAZolin (ANCEF) IVPB 2 g/50 mL premix     2 g 100 mL/hr over 30 Minutes Intravenous On call to O.R. 11/05/12 1610 11/05/12 0720     and started on DVT prophylaxis in the form of Xarelto.   PT and OT were ordered for total joint protocol.  Discharge planning consulted to help with postop disposition and equipment needs.  Patient had a decent night on the evening of surgery.  They started to get up OOB with therapy on day one. Hemovac drain was pulled without difficulty.   Continued to work with therapy into day two.  Dressing was changed on day two and the incision was healing well.  By day three, the patient had progressed with therapy and meeting their goals.  Incision was healing well.  Patient was seen in rounds and was ready to go home.   Discharge Medications: Prior to Admission medications   Medication Sig Start Date End Date Taking? Authorizing Provider  amLODipine (NORVASC) 5 MG tablet Take 5 mg by mouth every evening.    Yes Historical Provider, MD  atorvastatin (LIPITOR) 40 MG tablet Take 40 mg by mouth daily.   Yes Historical Provider, MD  Canagliflozin (INVOKANA) 100 MG TABS Take 1 tablet by mouth daily.   Yes Historical Provider, MD  ferrous sulfate 325 (65 FE) MG tablet Take 325 mg by mouth daily.   Yes Historical Provider, MD  insulin glargine (LANTUS) 100 UNIT/ML injection Inject 70 Units into the skin at bedtime.  Yes Historical Provider, MD  levothyroxine (LEVOXYL) 125 MCG tablet Take 125 mcg by mouth daily.    Yes Historical Provider, MD  Liraglutide (VICTOZA) 18 MG/3ML SOLN Inject 1.8 mg into the skin every morning.    Yes Historical Provider, MD  metFORMIN (GLUCOPHAGE) 500 MG tablet Take 1,000 mg by mouth 2 (two) times daily with a meal.    Yes Historical Provider, MD  sertraline (ZOLOFT) 100 MG tablet Take 100 mg by mouth every evening.    Yes Historical Provider, MD  valsartan-hydrochlorothiazide (DIOVAN-HCT) 320-25 MG per tablet Take 1 tablet by mouth every evening.   Yes Historical Provider, MD  acetaminophen (TYLENOL) 500 MG tablet Take 500 mg by mouth every 6 (six) hours as needed for pain.    Historical Provider, MD  methocarbamol (ROBAXIN) 500 MG tablet Take 1 tablet (500 mg total) by mouth every 6 (six) hours as needed. 11/07/12   Shalamar Crays, PA-C  oxyCODONE (OXY IR/ROXICODONE) 5 MG immediate release tablet Take 1-2 tablets (5-10 mg total) by mouth every 3 (three) hours as needed. 11/07/12   Marjorie Lussier Julien Girt, PA-C    rivaroxaban (XARELTO) 10 MG TABS tablet Take 1 tablet (10 mg total) by mouth daily with breakfast. Take Xarelto for two and a half more weeks, then discontinue Xarelto. Once the patient has completed the Xarelto, they may resume the 81 mg Aspirin. 11/07/12   Koa Palla, PA-C  traMADol (ULTRAM) 50 MG tablet Take 1-2 tablets (50-100 mg total) by mouth every 6 (six) hours as needed. 11/07/12   Ashauna Bertholf Julien Girt, PA-C    Diet: Cardiac diet and Diabetic diet Activity:WBAT Follow-up:in 2 weeks Disposition - Home Discharged Condition: good       Discharge Orders   Future Appointments Provider Department Dept Phone   11/26/2012 11:00 AM Lorrene Reid, PT [facility] [phone]   11/27/2012 10:15 AM Lorrene Reid, PT [facility] [phone]   11/29/2012 11:00 AM Lorrene Reid, PT [facility] [phone]   12/03/2012 10:15 AM Nehemiah Massed Naumann-Houegnifio, PTA [facility] [phone]   12/05/2012 10:15 AM Nehemiah Massed Naumann-Houegnifio, PTA [facility] [phone]   12/07/2012 10:15 AM Nehemiah Massed Naumann-Houegnifio, PTA [facility] [phone]   12/10/2012 10:15 AM Nehemiah Massed Naumann-Houegnifio, PTA [facility] [phone]   12/12/2012 10:15 AM Nehemiah Massed Naumann-Houegnifio, PTA [facility] [phone]   12/14/2012 10:15 AM Nehemiah Massed Naumann-Houegnifio, PTA [facility] [phone]   12/17/2012 10:15 AM Nehemiah Massed Naumann-Houegnifio, PTA [facility] [phone]   12/19/2012 10:15 AM Nehemiah Massed Naumann-Houegnifio, PTA [facility] [phone]   12/21/2012 10:15 AM Manfred Shirts, PT [facility] [phone]   12/24/2012 10:15 AM Lorrene Reid,  PT [facility] [phone]   12/26/2012 10:15 AM Nehemiah Massed Naumann-Houegnifio, PTA [facility] [phone]   12/28/2012 10:15 AM Nehemiah Massed Naumann-Houegnifio, PTA [facility] [phone]   12/31/2012 10:15 AM Nehemiah Massed Naumann-Houegnifio, PTA [facility] [phone]   01/02/2013 10:15 AM Nehemiah Massed Naumann-Houegnifio, PTA [facility] [phone]   01/04/2013 10:15 AM Nehemiah Massed Naumann-Houegnifio, PTA [facility] [phone]   Future Orders Complete By Expires   Call MD / Call 911  As directed    Comments:     If you experience chest pain or shortness of breath, CALL 911 and be transported to the hospital emergency room.  If you develope a fever above 101 F, pus (white drainage) or increased drainage or redness at the wound, or calf pain,  call your surgeon's office.   Change dressing  As directed    Comments:     Change dressing daily with sterile 4 x 4 inch gauze dressing and apply TED hose. Do not submerge the incision under water.   Constipation Prevention  As directed    Comments:     Drink plenty of fluids.  Prune juice may be helpful.  You may use a stool softener, such as Colace (over the counter) 100 mg twice a day.  Use MiraLax (over the counter) for constipation as needed.   Diet - low sodium heart healthy  As directed    Discharge instructions  As directed    Comments:     Pick up stool softner and laxative for home. Do not submerge incision under water. May shower. Continue to use ice for pain and swelling from surgery.  Take Xarelto for two and a half more weeks, then discontinue Xarelto. Once the patient has completed the Xarelto, they may resume the 81 mg Aspirin.   Do not put a pillow under the knee. Place it under the heel.  As directed    Do not sit on low chairs, stoools or toilet seats, as it  may be difficult to get up from low surfaces  As directed    Driving restrictions  As directed    Comments:     No driving until released by the physician.   Increase activity slowly as tolerated  As directed    Lifting restrictions  As directed    Comments:     No lifting until released by the physician.   Patient may shower  As directed    Comments:     You may shower without a dressing once there is no drainage.  Do not wash over the wound.  If drainage remains, do not shower until drainage stops.   TED hose  As directed    Comments:     Use stockings (TED hose) for 3 weeks on both leg(s).  You may remove them at night for sleeping.   Weight bearing as tolerated  As directed        Medication List    STOP taking these medications       aspirin EC 81 MG tablet      TAKE these medications       acetaminophen 500 MG tablet  Commonly known as:  TYLENOL  Take 500 mg by mouth every 6 (six) hours as needed for pain.     amLODipine 5 MG tablet  Commonly known as:  NORVASC  Take 5 mg by mouth every evening.     atorvastatin 40 MG tablet  Commonly known as:  LIPITOR  Take 40 mg by mouth daily.     ferrous sulfate 325 (65 FE) MG tablet  Take 325 mg by mouth daily.     insulin glargine 100 UNIT/ML injection  Commonly known as:  LANTUS  Inject 70 Units into the skin at bedtime.     INVOKANA 100 MG Tabs  Generic drug:  Canagliflozin  Take 1 tablet by mouth daily.     LEVOXYL 125 MCG tablet  Generic drug:  levothyroxine  Take 125 mcg by mouth daily.     metFORMIN 500 MG tablet  Commonly known as:  GLUCOPHAGE  Take 1,000 mg by mouth 2 (two) times daily with a meal.     methocarbamol 500 MG tablet  Commonly known as:  ROBAXIN  Take 1 tablet (500 mg  total) by mouth every 6 (six) hours as needed.     oxyCODONE 5 MG immediate release tablet  Commonly known as:  Oxy IR/ROXICODONE  Take 1-2 tablets (5-10 mg total) by mouth every 3 (three) hours as needed.      rivaroxaban 10 MG Tabs tablet  Commonly known as:  XARELTO  - Take 1 tablet (10 mg total) by mouth daily with breakfast. Take Xarelto for two and a half more weeks, then discontinue Xarelto.  - Once the patient has completed the Xarelto, they may resume the 81 mg Aspirin.     sertraline 100 MG tablet  Commonly known as:  ZOLOFT  Take 100 mg by mouth every evening.     traMADol 50 MG tablet  Commonly known as:  ULTRAM  Take 1-2 tablets (50-100 mg total) by mouth every 6 (six) hours as needed.     valsartan-hydrochlorothiazide 320-25 MG per tablet  Commonly known as:  DIOVAN-HCT  Take 1 tablet by mouth every evening.     VICTOZA 18 MG/3ML Soln injection  Generic drug:  Liraglutide  Inject 1.8 mg into the skin every morning.       Follow-up Information   Follow up with Loanne Drilling, MD. Schedule an appointment as soon as possible for a visit in 2 weeks.   Specialty:  Orthopedic Surgery   Contact information:   [address] [place] [address] [phone]       Signed: Patrica Duel 11/22/2012, 10:31 AM

## 2012-11-26 ENCOUNTER — Ambulatory Visit: Payer: 59 | Attending: Orthopedic Surgery

## 2012-11-26 DIAGNOSIS — R269 Unspecified abnormalities of gait and mobility: Secondary | ICD-10-CM | POA: Insufficient documentation

## 2012-11-26 DIAGNOSIS — M25569 Pain in unspecified knee: Secondary | ICD-10-CM | POA: Insufficient documentation

## 2012-11-26 DIAGNOSIS — IMO0001 Reserved for inherently not codable concepts without codable children: Secondary | ICD-10-CM | POA: Insufficient documentation

## 2012-11-26 DIAGNOSIS — M25669 Stiffness of unspecified knee, not elsewhere classified: Secondary | ICD-10-CM | POA: Insufficient documentation

## 2012-11-26 DIAGNOSIS — R609 Edema, unspecified: Secondary | ICD-10-CM | POA: Insufficient documentation

## 2012-11-26 DIAGNOSIS — M6281 Muscle weakness (generalized): Secondary | ICD-10-CM | POA: Insufficient documentation

## 2012-11-27 ENCOUNTER — Ambulatory Visit: Payer: 59

## 2012-11-29 ENCOUNTER — Ambulatory Visit: Payer: 59

## 2012-12-03 ENCOUNTER — Ambulatory Visit: Payer: 59 | Admitting: Physical Therapy

## 2012-12-05 ENCOUNTER — Ambulatory Visit: Payer: 59 | Admitting: Physical Therapy

## 2012-12-07 ENCOUNTER — Ambulatory Visit: Payer: 59 | Admitting: Physical Therapy

## 2012-12-10 ENCOUNTER — Ambulatory Visit: Payer: 59 | Admitting: Physical Therapy

## 2012-12-12 ENCOUNTER — Ambulatory Visit: Payer: 59 | Attending: Orthopedic Surgery | Admitting: Physical Therapy

## 2012-12-12 DIAGNOSIS — M6281 Muscle weakness (generalized): Secondary | ICD-10-CM | POA: Insufficient documentation

## 2012-12-12 DIAGNOSIS — M25569 Pain in unspecified knee: Secondary | ICD-10-CM | POA: Insufficient documentation

## 2012-12-12 DIAGNOSIS — M25669 Stiffness of unspecified knee, not elsewhere classified: Secondary | ICD-10-CM | POA: Insufficient documentation

## 2012-12-12 DIAGNOSIS — R609 Edema, unspecified: Secondary | ICD-10-CM | POA: Insufficient documentation

## 2012-12-12 DIAGNOSIS — R269 Unspecified abnormalities of gait and mobility: Secondary | ICD-10-CM | POA: Insufficient documentation

## 2012-12-12 DIAGNOSIS — IMO0001 Reserved for inherently not codable concepts without codable children: Secondary | ICD-10-CM | POA: Insufficient documentation

## 2012-12-14 ENCOUNTER — Ambulatory Visit: Payer: 59 | Admitting: Physical Therapy

## 2012-12-17 ENCOUNTER — Ambulatory Visit: Payer: 59 | Admitting: Physical Therapy

## 2012-12-19 ENCOUNTER — Ambulatory Visit: Payer: 59 | Admitting: Physical Therapy

## 2012-12-20 ENCOUNTER — Ambulatory Visit: Payer: 59 | Admitting: Physical Therapy

## 2012-12-21 ENCOUNTER — Encounter: Payer: 59 | Admitting: Physical Therapy

## 2012-12-24 ENCOUNTER — Ambulatory Visit: Payer: 59

## 2012-12-26 ENCOUNTER — Encounter: Payer: 59 | Admitting: Physical Therapy

## 2012-12-27 ENCOUNTER — Ambulatory Visit: Payer: 59 | Admitting: Physical Therapy

## 2012-12-28 ENCOUNTER — Encounter: Payer: 59 | Admitting: Physical Therapy

## 2012-12-31 ENCOUNTER — Encounter: Payer: 59 | Admitting: Physical Therapy

## 2013-01-02 ENCOUNTER — Ambulatory Visit: Payer: 59 | Admitting: Physical Therapy

## 2013-01-02 ENCOUNTER — Encounter: Payer: 59 | Admitting: Physical Therapy

## 2013-01-03 ENCOUNTER — Ambulatory Visit: Payer: 59 | Admitting: Physical Therapy

## 2013-01-04 ENCOUNTER — Encounter: Payer: 59 | Admitting: Physical Therapy

## 2013-01-08 ENCOUNTER — Ambulatory Visit: Payer: 59 | Admitting: Physical Therapy

## 2013-01-17 ENCOUNTER — Other Ambulatory Visit: Payer: Self-pay

## 2013-02-12 ENCOUNTER — Other Ambulatory Visit: Payer: Self-pay | Admitting: Orthopedic Surgery

## 2013-02-15 ENCOUNTER — Ambulatory Visit (INDEPENDENT_AMBULATORY_CARE_PROVIDER_SITE_OTHER): Payer: Self-pay | Admitting: Family Medicine

## 2013-02-15 VITALS — BP 137/88 | HR 77 | Wt 231.0 lb

## 2013-02-15 DIAGNOSIS — E119 Type 2 diabetes mellitus without complications: Secondary | ICD-10-CM

## 2013-02-15 NOTE — Progress Notes (Signed)
Patient presents for 3 mo f/u DM as part of the employee sponsored [employer]. Medications, insulin regimen, and glucose readings have been reviewed. I have also discussed with patient lifestyle interventions such as diet and exercise. Full documentation of this visit can be found in the [employer] documenting system through [employer] [facility]). However specifics from this visit include the following:  Diabetes Mellitus:POC A1C 6.4,at goal. However the patient has lost 13 pounds in 3 mo. Upon reviewing meter, she has had several lows (7 in the past 2 weeks). Lowest was 48.  plan 1.) back lantus down to 65 units. Will call patient in 2 weeks. If still having lows and continuing to lose weight, will discuss with Dr. Sharl Ma about taking off invokana since it's so expensive and it would decrease the number of oral meds she has to take.  2.) increase exercise on stationary bike 3.) f/u 2 wk per phone call and 3 mo for full visit and A1C check  Hyperlipidemia: on appropriate dosed statin, no recommended changes. HTN: diastolic slightly elevated but no usually, will monitor.  Patient has set a series of personal goals and will f/u in 3 mo for further review of DM.

## 2013-02-18 NOTE — Progress Notes (Signed)
To bring meds and ins card and id 

## 2013-02-20 NOTE — H&P (Signed)
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 She is s/p release of her right long A-1 pulley in May of 2011. [name] has had medial elbow pain for several months. 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   Her past history is brought up to date. She reports that she is 5'2" and 230 lbs. She has been using Tylenol for discomfort. She is allergic to Actos and tetanus toxoid. Current medications include Lantus 7 units daily, iron supplements 325 mg daily, B12 supplements, Invokana oral tablets 100 mg each morning, Levothyroxine 25 micrograms daily, Metformin 1 gram b.i.d. Amlodipine 5 mg daily, Diovan HCTZ 320 mg/12.5 mg daily, Victoza 18 mg/3 mg 1.8 mg q.a.m. Hydrocodone 5/325 1-2 tablets q4-6hrs PRN pain, Atorvastatin 40 mg daily. Prior surgery includes left shoulder times 4, right shoulder, right and left carpal tunnel release and right knee total arthroplasty. She is scheduled for left total knee arthroplasty by Dr. Lequita Halt in August. Her social history reveals she is single. She is a nonsmoker and does not drink alcoholic beverages. 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 A 14 system review of systems reveals contact lenses, history of hypertension, renal disease and sleep impairment.  Physical exam reveals a pleasant well appearing 62 year old woman. Inspection of her hands reveals no visible deformity. There is active triggering of her right ring finger in flexion. She has no PIP flexion contracture. She has full ROM of her thumbs, right index, long and small fingers and all fingers on the left side. She is tender at the left medial epicondyle. She has all the positive provocative signs of epicondylitis medially.   Plan: After informed consent and alcohol Betadine prep, she is injected with Depo Medrol and Lidocaine into her right ring finger flexor sheath. This was technically satisfactory.   [name] Zaring returned for follow up evaluation on 02-11-13 for her residual  triggering of her right ring finger at the A-1 pulley. She is about 60% better after injection on 01-07-13 but is still having some painful locking.   Nora would like to have this released before the year end. I have advised her to begin an exercise program. If she is not improved in the next 10 days, we will proceed with release of her right ring finger A-1 pulley under local anesthesia in a minor operating room setting.

## 2013-02-21 ENCOUNTER — Ambulatory Visit (HOSPITAL_BASED_OUTPATIENT_CLINIC_OR_DEPARTMENT_OTHER)
Admission: RE | Admit: 2013-02-21 | Discharge: 2013-02-21 | Disposition: A | Discharge status: Home / Self Care | Payer: 59 | Source: Ambulatory Visit | Attending: Orthopedic Surgery | Admitting: Orthopedic Surgery

## 2013-02-21 ENCOUNTER — Encounter (HOSPITAL_BASED_OUTPATIENT_CLINIC_OR_DEPARTMENT_OTHER): Payer: Self-pay

## 2013-02-21 ENCOUNTER — Encounter (HOSPITAL_BASED_OUTPATIENT_CLINIC_OR_DEPARTMENT_OTHER)
Admission: RE | Disposition: A | Discharge status: Home / Self Care | Payer: Self-pay | Source: Ambulatory Visit | Attending: Orthopedic Surgery

## 2013-02-21 DIAGNOSIS — Z79899 Other long term (current) drug therapy: Secondary | ICD-10-CM | POA: Insufficient documentation

## 2013-02-21 DIAGNOSIS — I10 Essential (primary) hypertension: Secondary | ICD-10-CM | POA: Insufficient documentation

## 2013-02-21 DIAGNOSIS — Z794 Long term (current) use of insulin: Secondary | ICD-10-CM | POA: Insufficient documentation

## 2013-02-21 DIAGNOSIS — M6530 Trigger finger, unspecified finger: Secondary | ICD-10-CM | POA: Insufficient documentation

## 2013-02-21 DIAGNOSIS — N289 Disorder of kidney and ureter, unspecified: Secondary | ICD-10-CM | POA: Insufficient documentation

## 2013-02-21 DIAGNOSIS — G479 Sleep disorder, unspecified: Secondary | ICD-10-CM | POA: Insufficient documentation

## 2013-02-21 HISTORY — PX: TRIGGER FINGER RELEASE: SHX641

## 2013-02-21 SURGERY — MINOR RELEASE TRIGGER FINGER/A-1 PULLEY
Anesthesia: LOCAL | Site: Finger | Laterality: Right

## 2013-02-21 MED ORDER — TRAMADOL HCL 50 MG PO TABS: 50.0000 mg | ORAL_TABLET | Freq: Four times a day (QID) | ORAL | Status: DC | PRN

## 2013-02-21 MED ORDER — LIDOCAINE HCL 2 % IJ SOLN
INTRAMUSCULAR | Status: AC
  Filled 2013-02-21: qty 20

## 2013-02-21 MED ORDER — CHLORHEXIDINE GLUCONATE 4 % EX LIQD: 60.0000 mL | Freq: Once | CUTANEOUS | Status: DC

## 2013-02-21 MED ORDER — LIDOCAINE HCL 2 % IJ SOLN
INTRAMUSCULAR | Status: DC | PRN
  Administered 2013-02-21: 3 mL

## 2013-02-21 SURGICAL SUPPLY — 36 items
BANDAGE ADHESIVE 1X3 (GAUZE/BANDAGES/DRESSINGS) IMPLANT
BANDAGE COBAN STERILE 2 (GAUZE/BANDAGES/DRESSINGS) ×2 IMPLANT
BLADE SURG 15 STRL LF DISP TIS (BLADE) ×1 IMPLANT
BLADE SURG 15 STRL SS (BLADE) ×1
BNDG CMPR 9X4 STRL LF SNTH (GAUZE/BANDAGES/DRESSINGS)
BNDG ESMARK 4X9 LF (GAUZE/BANDAGES/DRESSINGS) IMPLANT
BRUSH SCRUB EZ PLAIN DRY (MISCELLANEOUS) ×2 IMPLANT
CORDS BIPOLAR (ELECTRODE) IMPLANT
COVER MAYO STAND STRL (DRAPES) ×2 IMPLANT
COVER TABLE BACK 60X90 (DRAPES) IMPLANT
CUFF TOURNIQUET SINGLE 18IN (TOURNIQUET CUFF) IMPLANT
CUFF TOURNIQUET SINGLE 24IN (TOURNIQUET CUFF) ×2 IMPLANT
DECANTER SPIKE VIAL GLASS SM (MISCELLANEOUS) IMPLANT
DRAPE SURG 17X23 STRL (DRAPES) ×2 IMPLANT
GAUZE SPONGE 4X4 12PLY STRL LF (GAUZE/BANDAGES/DRESSINGS) ×4 IMPLANT
GLOVE BIOGEL M STRL SZ7.5 (GLOVE) ×2 IMPLANT
GLOVE BIOGEL PI IND STRL 7.0 (GLOVE) ×1 IMPLANT
GLOVE BIOGEL PI INDICATOR 7.0 (GLOVE) ×1
GLOVE ECLIPSE 6.5 STRL STRAW (GLOVE) ×4 IMPLANT
GLOVE ORTHO TXT STRL SZ7.5 (GLOVE) ×2 IMPLANT
GOWN BRE IMP PREV XXLGXLNG (GOWN DISPOSABLE) ×2 IMPLANT
GOWN PREVENTION PLUS XLARGE (GOWN DISPOSABLE) ×4 IMPLANT
NEEDLE 27GAX1X1/2 (NEEDLE) ×2 IMPLANT
PACK BASIN DAY SURGERY FS (CUSTOM PROCEDURE TRAY) IMPLANT
PADDING CAST ABS 4INX4YD NS (CAST SUPPLIES)
PADDING CAST ABS COTTON 4X4 ST (CAST SUPPLIES) IMPLANT
SPONGE GAUZE 4X4 12PLY (GAUZE/BANDAGES/DRESSINGS) IMPLANT
STOCKINETTE 4X48 STRL (DRAPES) ×2 IMPLANT
STRIP CLOSURE SKIN 1/2X4 (GAUZE/BANDAGES/DRESSINGS) ×2 IMPLANT
SUT PROLENE 3 0 PS 2 (SUTURE) IMPLANT
SUT PROLENE 4 0 P 3 18 (SUTURE) ×2 IMPLANT
SYR 3ML 23GX1 SAFETY (SYRINGE) IMPLANT
SYR CONTROL 10ML LL (SYRINGE) ×2 IMPLANT
TOWEL OR 17X24 6PK STRL BLUE (TOWEL DISPOSABLE) ×4 IMPLANT
TRAY DSU PREP LF (CUSTOM PROCEDURE TRAY) ×2 IMPLANT
UNDERPAD 30X30 INCONTINENT (UNDERPADS AND DIAPERS) ×2 IMPLANT

## 2013-02-21 NOTE — Op Note (Signed)
[id number] 

## 2013-02-21 NOTE — Brief Op Note (Signed)
02/21/2013  8:54 AM  PATIENT:  [name]  62 y.o. female  PRE-OPERATIVE DIAGNOSIS:  LOCKING STS RIGHT RING FINGER       POST-OPERATIVE DIAGNOSIS:  LOCKING STENOSING TENOSYNOVITIS  RIGHT RING FINGER   PROCEDURE:  Procedure(s): RIGHT RING A-1 PULLEY RELEASE    (MINOR PROCEDURE)  (Right)  SURGEON:  Surgeon(s) and Role:    * Wyn Forster., MD - Primary  PHYSICIAN ASSISTANT:   ASSISTANTS: surgical technician   ANESTHESIA:   local  EBL:     BLOOD ADMINISTERED:none  DRAINS: none   LOCAL MEDICATIONS USED:  LIDOCAINE   SPECIMEN:  No Specimen  DISPOSITION OF SPECIMEN:  N/A  COUNTS:  YES  TOURNIQUET:   Total Tourniquet Time Documented: Upper Arm (Right) - 7 minutes Total: Upper Arm (Right) - 7 minutes   DICTATION: .Other Dictation: Dictation Number [id number]  PLAN OF CARE: Discharge to home after PACU  PATIENT DISPOSITION:  PACU - hemodynamically stable.   Delay start of Pharmacological VTE agent (>24hrs) due to surgical blood loss or risk of bleeding: not applicable

## 2013-02-22 ENCOUNTER — Encounter (HOSPITAL_BASED_OUTPATIENT_CLINIC_OR_DEPARTMENT_OTHER): Payer: Self-pay | Admitting: Orthopedic Surgery

## 2013-02-22 NOTE — Op Note (Signed)
NAMEMarland Kitchen  KALYNA, [name]                 ACCOUNT NO.:  [id number]  MEDICAL RECORD NO.:  [id number]  LOCATION:                               FACILITY:  [facility]  PHYSICIAN:  [name], [name] 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  OPERATION:  Release of right ring finger A1 pulley.  OPERATING SURGEON:  [name], [name]  ASSISTANT:  Surgical technician.  ANESTHESIA:  2% lidocaine, flexor sheath block and palmar block of right hand, which was performed as a minor operating room procedure.  INDICATIONS:  [name] is a 62 year old employee of [employer].  She works in admissions.  She has a history of triggering of her right ring finger for months.  This has been unresponsive to injection.  She requested release of her A1 pulley.  She has had prior experience in the past.  After detailed informed consent, she was brought to the operating room at this time.  She is noted to have background type 2 diabetes.  Preoperatively, she was interviewed in the holding area.  A proper surgical site was identified per protocol and marked with a marking pen. Questions were invited and answered in detail.  PROCEDURE:  [name] was escorted to room #2 of the [facility] and placed supine position on the operating table.  After confirming her proper surgical site, her palm was anesthetized with 2% lidocaine after Betadine prep.  A 2% lidocaine without epinephrine was infiltrated in the path of the intended incision and after few moments into the flexor sheath of the right ring finger.  Within 5 minutes, excellent anesthesia of the ring finger was achieved.  The right hand and arm were then prepped with  Betadine soap and solution, sterilely draped.  A pneumatic tourniquet was applied to the proximal brachium.  Following exsanguination of the right hand and arm with direct compression, the arterial tourniquet was inflated to 220 mmHg. Following routine surgical time-out, procedure commenced with a 1-cm oblique incision directly over the palpably thickened A1 pulley. Subcutaneous tissues were carefully divided, taking care to identify and gently retracted the neurovascular structures.  The palmar fascia was incidentally released.  The A1 pulley was isolated and it was split with scalpel and scissors.  Careful proximal dissection revealed a 3-mm wide A0 pulley.  This was also released.  A cuff of fibrotic tenosynovium between the pulleys was excised with scissors.  Thereafter, [name] demonstrated full active range of motion of her finger in flexion and extension.  The wound was then repaired with intradermal 4-0 Prolene.  Steri-Strips applied.  [name] was placed in compressive dressing with Coban wrap.  The tourniquet was released with immediate capillary refill of the fingers and thumb.  For aftercare, she is provided a prescription for tramadol 50 mg 1 p.o. q.4-6 hours p.r.n. pain, 30 tablets without refill.     [name], [name]   ______________________________ [name],  [name]    RVS/MEDQ  D:  02/21/2013  T:  02/22/2013  Job:  [id number]  cc:   [name], [name]

## 2013-03-18 ENCOUNTER — Ambulatory Visit: Payer: 59 | Attending: Orthopedic Surgery

## 2013-03-18 DIAGNOSIS — M25529 Pain in unspecified elbow: Secondary | ICD-10-CM | POA: Insufficient documentation

## 2013-03-18 DIAGNOSIS — E119 Type 2 diabetes mellitus without complications: Secondary | ICD-10-CM | POA: Insufficient documentation

## 2013-03-18 DIAGNOSIS — IMO0001 Reserved for inherently not codable concepts without codable children: Secondary | ICD-10-CM | POA: Insufficient documentation

## 2013-03-18 DIAGNOSIS — Z96659 Presence of unspecified artificial knee joint: Secondary | ICD-10-CM | POA: Insufficient documentation

## 2013-03-18 DIAGNOSIS — R5381 Other malaise: Secondary | ICD-10-CM | POA: Insufficient documentation

## 2013-03-18 DIAGNOSIS — I10 Essential (primary) hypertension: Secondary | ICD-10-CM | POA: Insufficient documentation

## 2013-03-20 ENCOUNTER — Ambulatory Visit: Payer: 59 | Admitting: Physical Therapy

## 2013-03-25 ENCOUNTER — Ambulatory Visit: Payer: 59 | Admitting: Physical Therapy

## 2013-03-27 ENCOUNTER — Ambulatory Visit: Payer: 59 | Admitting: Physical Therapy

## 2013-04-01 ENCOUNTER — Ambulatory Visit: Payer: 59

## 2013-04-03 ENCOUNTER — Ambulatory Visit: Payer: 59

## 2013-04-03 NOTE — Progress Notes (Signed)
Patient ID: Alinda DeemBonnie D Jerger, female   DOB: 12/12/1950, 63 y.o.   MRN: [id number] ATTENDING PHYSICIAN NOTE: I have reviewed the chart and agree with the plan as detailed above. Denny LevySara Yoshika Vensel MD Pager [phone]

## 2013-04-17 ENCOUNTER — Other Ambulatory Visit (HOSPITAL_COMMUNITY): Payer: Self-pay | Admitting: Internal Medicine

## 2013-04-17 DIAGNOSIS — Z1231 Encounter for screening mammogram for malignant neoplasm of breast: Secondary | ICD-10-CM

## 2013-04-17 DIAGNOSIS — Z780 Asymptomatic menopausal state: Secondary | ICD-10-CM

## 2013-04-25 ENCOUNTER — Ambulatory Visit (HOSPITAL_COMMUNITY)
Admission: RE | Admit: 2013-04-25 | Discharge: 2013-04-25 | Disposition: A | Discharge status: Home / Self Care | Payer: 59 | Source: Ambulatory Visit | Attending: Internal Medicine | Admitting: Internal Medicine

## 2013-04-25 DIAGNOSIS — Z780 Asymptomatic menopausal state: Secondary | ICD-10-CM

## 2013-04-25 DIAGNOSIS — Z1231 Encounter for screening mammogram for malignant neoplasm of breast: Secondary | ICD-10-CM | POA: Insufficient documentation

## 2013-06-03 ENCOUNTER — Ambulatory Visit (INDEPENDENT_AMBULATORY_CARE_PROVIDER_SITE_OTHER): Payer: Self-pay | Admitting: Family Medicine

## 2013-06-03 VITALS — BP 138/82 | HR 74 | Wt 240.0 lb

## 2013-06-03 DIAGNOSIS — E119 Type 2 diabetes mellitus without complications: Secondary | ICD-10-CM

## 2013-06-03 NOTE — Progress Notes (Signed)
Patient presents for 3 mo f/u DM as part of the employee sponsored Link to [employer]. Medications, glucose readings, and insulin regimen have been reviewed. I have also discussed with patient lifestyle interventions such as diet and exercise. Full documentation of this visit can be found in the [employer] documenting system through [facility] However specifics from this visit include the following:   Diabetes Mellitus: POC A1c 6.5, at goal. patient has been experiencing more lows but has cut back on her lantus from 70 to 60. Still having some lows. Dr. Sharl MaKerr had discussed with her if needed she could continue to back up on the lantus. He did increase the victoza from 1.2 mg to 1.8 and combined her invokana and metformin to invokamet. plan 1.) no recommended changes. Did counsel pt to back off the lantus 2 units at a time if having continued lows.  2.) having frequent 200's at 10 pm. Told patient to make dinner her smallest meal and try to not eat past 6 pm 3.) f/u 3 mo  Hyperlipidemia: on appropiately dosed statin  Hypertension:  at goal  Patient has set a series of personal goals and will f/u in 3 mo for further review of DM

## 2013-07-17 ENCOUNTER — Other Ambulatory Visit: Payer: Self-pay | Admitting: Internal Medicine

## 2013-07-17 ENCOUNTER — Other Ambulatory Visit (HOSPITAL_COMMUNITY)
Admission: RE | Admit: 2013-07-17 | Discharge: 2013-07-17 | Disposition: A | Discharge status: Home / Self Care | Payer: 59 | Source: Ambulatory Visit | Attending: Internal Medicine | Admitting: Internal Medicine

## 2013-07-17 DIAGNOSIS — Z01419 Encounter for gynecological examination (general) (routine) without abnormal findings: Secondary | ICD-10-CM | POA: Insufficient documentation

## 2013-09-26 ENCOUNTER — Ambulatory Visit (INDEPENDENT_AMBULATORY_CARE_PROVIDER_SITE_OTHER): Payer: Self-pay | Admitting: Family Medicine

## 2013-09-26 VITALS — BP 127/78 | HR 79 | Wt 240.0 lb

## 2013-09-26 DIAGNOSIS — E119 Type 2 diabetes mellitus without complications: Secondary | ICD-10-CM

## 2013-09-26 NOTE — Progress Notes (Signed)
Patient presents for 3 mo f/u DM as part of the employee sponsored Link to [employer]. Medications have been reviewed. I have also discussed with patient lifestyle interventions such as diet and exercise. Full documentation of this visit can be found in the caretracker documenting system through [facility] However specifics from this visit include the following:  Diabetes Mellitus: POC A1C 6.3, at goal. No recommended medication changes. f/u 3 mo  Hyperlipidemia: on appropriately dosed statin  Hypertension: at goal on amlodipine and diovan/hctz  Patient has set a series of personal goals and will f/u in 3 mo for further review of dm

## 2013-10-01 ENCOUNTER — Other Ambulatory Visit (HOSPITAL_COMMUNITY): Payer: Self-pay | Admitting: Orthopedic Surgery

## 2013-10-01 DIAGNOSIS — M25561 Pain in right knee: Secondary | ICD-10-CM

## 2013-10-03 NOTE — Progress Notes (Signed)
Patient ID: [name], female   DOB: Jul 14, 1950, 63 y.o.   MRN: [id number] ATTENDING PHYSICIAN NOTE: I have reviewed the chart and agree with the plan as detailed above. Denny LevySara Neal MD Pager [phone]

## 2013-10-14 ENCOUNTER — Ambulatory Visit (HOSPITAL_COMMUNITY): Payer: 59

## 2013-10-21 ENCOUNTER — Encounter (HOSPITAL_COMMUNITY)
Admission: RE | Admit: 2013-10-21 | Discharge: 2013-10-21 | Disposition: A | Discharge status: Home / Self Care | Payer: 59 | Source: Ambulatory Visit | Attending: Orthopedic Surgery | Admitting: Orthopedic Surgery

## 2013-10-21 DIAGNOSIS — M25569 Pain in unspecified knee: Secondary | ICD-10-CM | POA: Insufficient documentation

## 2013-10-21 DIAGNOSIS — M25561 Pain in right knee: Secondary | ICD-10-CM

## 2013-10-21 MED ORDER — TECHNETIUM TC 99M MEDRONATE IV KIT
25.5000 | PACK | Freq: Once | INTRAVENOUS | Status: AC | PRN
  Administered 2013-10-21: 25.5 via INTRAVENOUS

## 2013-10-28 ENCOUNTER — Encounter: Payer: Self-pay | Admitting: Family Medicine

## 2013-10-28 NOTE — Progress Notes (Signed)
Patient ID: [name], female   DOB: August 21, 1950, 63 y.o.   MRN: [id number] Reviewed:  Agree with the documentation and management of our pharmacologist.

## 2014-01-08 ENCOUNTER — Ambulatory Visit (INDEPENDENT_AMBULATORY_CARE_PROVIDER_SITE_OTHER): Payer: Self-pay | Admitting: Family Medicine

## 2014-01-08 VITALS — BP 135/65 | HR 78 | Wt 244.0 lb

## 2014-01-08 DIAGNOSIS — E119 Type 2 diabetes mellitus without complications: Secondary | ICD-10-CM

## 2014-01-08 DIAGNOSIS — E785 Hyperlipidemia, unspecified: Secondary | ICD-10-CM

## 2014-01-08 DIAGNOSIS — I10 Essential (primary) hypertension: Secondary | ICD-10-CM

## 2014-01-08 NOTE — Progress Notes (Signed)
Patient presents for 3 mo f/u DM as part of the employee sponsored Link to [employer]. Medications, glucose readings, and insulin regimen have been reviewed. I have also discussed with patient lifestyle interventions such as diet and exercise. Full documentation of this visit can be found in the [employer] documenting system through [facility] However specifics from this visit include the following:  Diabetes Mellitus:  POC A1C 7.4, greatly jumped from her normal. Patient has had 5 steroid injections in her feet in late september/early october. Looking at her meter during this time, her blood sugars skyrocketed into the 300's. plan 1.) patient will return in 1 mo for meter check for me to make sure her blood sugars are coming down. She is still in the 200's in late afternoon from 8pm-12 am. 2.) will recheck A1C in 3 mo (i think it should return to normal, the steroids just elevated it).  Hyperlipidemia: on statin-no recommended changes  Hypertension:  at goal on diovan/hctz and amlodipine-no recommended changes    Patient has set a series of personal goals and will f/u in 3 mo for further review of DM

## 2014-01-28 NOTE — Progress Notes (Signed)
Patient ID: [name], female   DOB: 04/05/1950, 63 y.o.   MRN: [id number] Reviewed: Agree with the documentation and management of our [facility] Pharmacologist.

## 2014-03-24 ENCOUNTER — Ambulatory Visit: Payer: 59 | Attending: Orthopaedic Surgery

## 2014-03-24 DIAGNOSIS — R5381 Other malaise: Secondary | ICD-10-CM | POA: Diagnosis not present

## 2014-03-24 DIAGNOSIS — M25511 Pain in right shoulder: Secondary | ICD-10-CM | POA: Insufficient documentation

## 2014-03-24 DIAGNOSIS — M7521 Bicipital tendinitis, right shoulder: Secondary | ICD-10-CM | POA: Diagnosis not present

## 2014-03-24 DIAGNOSIS — M25611 Stiffness of right shoulder, not elsewhere classified: Secondary | ICD-10-CM | POA: Diagnosis not present

## 2014-03-25 ENCOUNTER — Ambulatory Visit: Payer: 59 | Admitting: Physical Therapy

## 2014-04-01 ENCOUNTER — Ambulatory Visit: Payer: 59

## 2014-04-01 DIAGNOSIS — M25511 Pain in right shoulder: Secondary | ICD-10-CM | POA: Diagnosis not present

## 2014-04-03 ENCOUNTER — Ambulatory Visit: Payer: 59 | Admitting: Physical Therapy

## 2014-04-03 DIAGNOSIS — M25511 Pain in right shoulder: Secondary | ICD-10-CM | POA: Diagnosis not present

## 2014-04-08 ENCOUNTER — Ambulatory Visit: Payer: 59

## 2014-04-08 DIAGNOSIS — M25511 Pain in right shoulder: Secondary | ICD-10-CM | POA: Diagnosis not present

## 2014-04-10 ENCOUNTER — Ambulatory Visit: Payer: 59 | Admitting: Physical Therapy

## 2014-04-15 ENCOUNTER — Ambulatory Visit: Payer: 59 | Attending: Orthopaedic Surgery

## 2014-04-15 DIAGNOSIS — R5381 Other malaise: Secondary | ICD-10-CM | POA: Diagnosis not present

## 2014-04-15 DIAGNOSIS — M25611 Stiffness of right shoulder, not elsewhere classified: Secondary | ICD-10-CM | POA: Diagnosis not present

## 2014-04-15 DIAGNOSIS — M7521 Bicipital tendinitis, right shoulder: Secondary | ICD-10-CM | POA: Diagnosis not present

## 2014-04-15 DIAGNOSIS — M25511 Pain in right shoulder: Secondary | ICD-10-CM | POA: Insufficient documentation

## 2014-04-16 ENCOUNTER — Ambulatory Visit (INDEPENDENT_AMBULATORY_CARE_PROVIDER_SITE_OTHER): Payer: Self-pay | Admitting: Family Medicine

## 2014-04-16 VITALS — BP 133/67 | HR 86 | Wt 247.0 lb

## 2014-04-16 DIAGNOSIS — E119 Type 2 diabetes mellitus without complications: Secondary | ICD-10-CM

## 2014-04-16 NOTE — Progress Notes (Signed)
Patient presents for 3 mo f/u DM as part of the employee sponsored Link to [employer]. Medications and glucose readings have been reviewed. I have also discussed with patient lifestyle interventions such as diet and exercise. Full documentation of this visit can be found in the [employer] documenting system through [facility] However, specifics from this visit include the following:  Diabetes Mellitus:  POC A1C 7.4, above goal and has not changed since last check. She has increased her lantus to 72 units. Blood sugar looks slightly better but fasting still elevated at times. plan 1.) Dr. Sharl MaKerr is ok with her increasing her lantus when she needs to. She will continue to go up slowly but will stop at 80 units. I told her he should see her before she goes above that because he might want to consider doing meal time insulin at that point. Reviewed goal blood sugar of <130 fasting. 2.) Her invokamet is not at max dose. I told her to discuss with him possibility of increasing to 150/1000 bid if she is still above goal at her march check up with him. 3.) Patient will discuss with Dr. Sharl MaKerr switching all her supplies to contour for a free copay 3.) f/u 3 mo   Patient has set a series of personal goals and will f/u in 3 mo for further review of DM

## 2014-04-17 ENCOUNTER — Ambulatory Visit: Payer: 59

## 2014-04-17 DIAGNOSIS — M25511 Pain in right shoulder: Secondary | ICD-10-CM | POA: Diagnosis not present

## 2014-04-22 ENCOUNTER — Ambulatory Visit: Payer: 59 | Admitting: Physical Therapy

## 2014-04-22 DIAGNOSIS — M25511 Pain in right shoulder: Secondary | ICD-10-CM | POA: Diagnosis not present

## 2014-05-15 NOTE — Progress Notes (Signed)
Patient ID: [name], female   DOB: 1950-08-26, 64 y.o.   MRN: [id number] Reviewed: Agree with the documentation and management of our [facility] Pharmacologist.

## 2014-09-01 ENCOUNTER — Ambulatory Visit: Payer: 59

## 2014-09-03 ENCOUNTER — Ambulatory Visit: Payer: 59

## 2014-09-11 ENCOUNTER — Encounter: Payer: 59 | Admitting: Physical Therapy

## 2014-09-16 ENCOUNTER — Encounter: Payer: 59 | Admitting: Physical Therapy

## 2014-09-18 ENCOUNTER — Ambulatory Visit (INDEPENDENT_AMBULATORY_CARE_PROVIDER_SITE_OTHER): Payer: 59 | Admitting: Family Medicine

## 2014-09-18 ENCOUNTER — Encounter: Payer: 59 | Admitting: Physical Therapy

## 2014-09-18 ENCOUNTER — Ambulatory Visit: Payer: 59

## 2014-09-18 VITALS — BP 126/79 | HR 79 | Wt 250.0 lb

## 2014-09-18 DIAGNOSIS — E119 Type 2 diabetes mellitus without complications: Secondary | ICD-10-CM | POA: Diagnosis not present

## 2014-09-18 LAB — POCT GLYCOSYLATED HEMOGLOBIN (HGB A1C): HEMOGLOBIN A1C: 7.9

## 2014-09-18 NOTE — Progress Notes (Signed)
Patient presents to pharmacy for DM follow up as part of the employee sponsored Link to [employer]. Medications have been reviewed. I have also discussed with patient lifestyle interventions such as diet and exercise. Specific areas of concern inclue:  1.) A1C 7.9 above goal. Victoza at max dose, lantus @ 80 units. Suggested to patient to get a couple post prandial checks to take to Dr. Sharl MaKerr. I would recommend increasing invokamet to 150/1000 and will have patient discuss with Dr. Sharl MaKerr whether he wants to add meal time insulin after looking at her post prandial checks. HTN- bp at goal today, no recommended changes HLD-on appropiately dosed statin.  Will f/u with endocrinologist in august. Patient will contact me with updates to her regimen. I will recheck A1C in 3 mo. Patient has set a series of personal goals and will f/u in 3 mo for further review of DM

## 2014-09-23 ENCOUNTER — Encounter: Payer: 59 | Admitting: Physical Therapy

## 2014-09-25 ENCOUNTER — Encounter: Payer: 59 | Admitting: Physical Therapy

## 2014-09-30 ENCOUNTER — Encounter: Payer: 59 | Admitting: Physical Therapy

## 2014-09-30 NOTE — Progress Notes (Signed)
ATTENDING PHYSICIAN NOTE: I have reviewed the chart and agree with the plan as detailed above. Ellan Tess MD Pager [phone]  

## 2014-10-02 ENCOUNTER — Encounter: Payer: 59 | Admitting: Physical Therapy

## 2014-10-07 ENCOUNTER — Encounter: Payer: 59 | Admitting: Physical Therapy

## 2014-10-09 ENCOUNTER — Encounter: Payer: 59 | Admitting: Physical Therapy

## 2014-12-30 ENCOUNTER — Ambulatory Visit: Payer: 59

## 2015-01-02 ENCOUNTER — Ambulatory Visit: Payer: Self-pay

## 2015-01-02 NOTE — Progress Notes (Unsigned)
Subjective:  Patient presents today for 3 month diabetes follow-up as part of the employer-sponsored Link to Wellness program.  Current diabetes regimen includes victoza, lantus.  Patient also continues on daily ASA, ARB and statin.  Most recent MD follow-up was august.Patient was recently taken off of invokamet due to kidney function and her lantus increased to 100 units.   Assessment:  Diabetes: Most recent A1C today was 8.7   % which is exceeding goal of less than 7%. Fasting blood sugars have been consistently in the 200-300's since cessation of invokamet.    Follow up with me in 3 months.    Plan/Goals for Next Visit: 1.) asked patient to make an appt with Dr. Sharl MaKerr (endocrinologist) for this month due to elevated blood sugars instead of waiting until march. I do not have labs available from his office but metformin has had a labeling change with renal impairment recently. If her clearance is above 45 mL/min, she should be able to resume the metformin (but not the invokana). I asked patient to provide me with a Scr so I can calculate her clearance. I also asked her to discuss with Dr. Sharl MaKerr possibility of meal time insulin as her lantus is getting cranked up pretty high at this point and she may need post prandial coverage to help. 2.) patient will call me after visit with Dr. Sharl MaKerr to update me on changes.     Next appointment to see me is: January 2017

## 2015-03-17 MED FILL — AMLODIPINE BESYLATE 5 MG TA: 5 | 90 days supply | Qty: 90 | Fill #1

## 2015-03-18 MED FILL — DULoxetine HCL 60 MG CPEP: 60 | 30 days supply | Qty: 30 | Fill #6

## 2015-03-18 MED FILL — LEVOTHYROXINE 100 MCG TAB: 100 | 90 days supply | Qty: 90 | Fill #0

## 2015-03-19 ENCOUNTER — Encounter: Payer: 59 | Attending: Internal Medicine | Admitting: *Deleted

## 2015-03-19 ENCOUNTER — Encounter: Payer: Self-pay | Admitting: *Deleted

## 2015-03-19 VITALS — Ht 62.0 in | Wt 247.5 lb

## 2015-03-19 DIAGNOSIS — E119 Type 2 diabetes mellitus without complications: Secondary | ICD-10-CM | POA: Insufficient documentation

## 2015-03-19 DIAGNOSIS — Z713 Dietary counseling and surveillance: Secondary | ICD-10-CM | POA: Diagnosis not present

## 2015-03-19 NOTE — Patient Instructions (Signed)
Plan:  Aim for 2 Carb Choices per meal (30 grams) +/- 1 either way  Aim for 0-1 Carbs per snack if hungry  Include protein in moderation with your meals and snacks Consider reading food labels for Total Carbohydrate and Fat Grams of foods Continue with your activity level by swimming and check into Tai Chi as tolerated Continue checking BG at alternate times per day as directed by MD  Continue taking medication as directed by MD

## 2015-03-20 NOTE — Progress Notes (Signed)
Diabetes Self-Management Education  Visit Type: First/Initial  Appt. Start Time: 1530 Appt. End Time: 1630  03/20/2015  [name], identified by name and date of birth, is a 65 y.o. female with a diagnosis of Diabetes: Type 2.   ASSESSMENT  Height 5' 2" (1.575 m), weight 247 lb 8 oz (112.265 kg). Body mass index is 45.26 kg/(m^2).      Diabetes Self-Management Education - 03/19/15 1526    Visit Information   Visit Type First/Initial   Initial Visit   Diabetes Type Type 2   Date Diagnosed 1980   Health Coping   How would you rate your overall health? Good   Psychosocial Assessment   Patient Belief/Attitude about Diabetes Motivated to manage diabetes   Self-care barriers None   Other persons present Patient   Patient Concerns Nutrition/Meal planning;Glycemic Control   Special Needs None   Preferred Learning Style Hands on   Learning Readiness Ready   How often do you need to have someone help you when you read instructions, pamphlets, or other written materials from your doctor or pharmacy? 1 - Never   What is the last grade level you completed in school? 2 years college   Complications   Last HgB A1C per patient/outside source 7.1 %   How often do you check your blood sugar? 1-2 times/day   Number of hypoglycemic episodes per month 2   Have you had a dilated eye exam in the past 12 months? Yes   Have you had a dental exam in the past 12 months? Yes   Are you checking your feet? Yes   How many days per week are you checking your feet? 7   Dietary Intake   Breakfast 2 eggs boiled, toast or sandwich   Snack (morning) not enough time   Lunch turkey sandwich on dark bread, Greek olives, fresh fruit   Snack (afternoon) Greek olives    Dinner varies - sometimes she cooks, sometimes whatever is available: cheese crackers and olives OR hot meal, often casserole OR    Snack (evening) not unless low BG, glass of milk with graham crackers   Beverage(s) water, coffee   Exercise   Exercise Type Light (walking / raking leaves)  gym, pool exercises   How many days per week to you exercise? 3   How many minutes per day do you exercise? 50   Total minutes per week of exercise 150   Patient Education   Previous Diabetes Education Yes (please comment)  2 years ago   Disease state  Definition of diabetes, type 1 and 2, and the diagnosis of diabetes   Nutrition management  Food label reading, portion sizes and measuring food.;Carbohydrate counting;Role of diet in the treatment of diabetes and the relationship between the three main macronutrients and blood glucose level   Physical activity and exercise  Role of exercise on diabetes management, blood pressure control and cardiac health.   Medications Reviewed patients medication for diabetes, action, purpose, timing of dose and side effects.  Discussed value of fast acting insulin either for meal time but also as a correction for hyperglycemia   Monitoring Identified appropriate SMBG and/or A1C goals.   Acute complications Taught treatment of hypoglycemia - the 15 rule.   Chronic complications Relationship between chronic complications and blood glucose control   Psychosocial adjustment Role of stress on diabetes   Personal strategies to promote health Lifestyle issues that need to be addressed for better diabetes care   Individualized   Goals (developed by patient)   Nutrition Follow meal plan discussed   Physical Activity Exercise 3-5 times per week   Medications take my medication as prescribed   Monitoring  test my blood glucose as discussed   Reducing Risk examine blood glucose patterns   Outcomes   Expected Outcomes Demonstrated interest in learning. Expect positive outcomes   Future DMSE PRN   Program Status Completed      Individualized Plan for Diabetes Self-Management Training:   Learning Objective:  Patient will have a greater understanding of diabetes self-management. Patient education plan is to  attend individual and/or group sessions per assessed needs and concerns.   Plan:   Patient Instructions  Plan:  Aim for 2 Carb Choices per meal (30 grams) +/- 1 either way  Aim for 0-1 Carbs per snack if hungry  Include protein in moderation with your meals and snacks Consider reading food labels for Total Carbohydrate and Fat Grams of foods Continue with your activity level by swimming and check into Tai Chi as tolerated Continue checking BG at alternate times per day as directed by MD  Continue taking medication as directed by MD      Expected Outcomes:  Demonstrated interest in learning. Expect positive outcomes  Education material provided: A1C conversion sheet, Meal plan card and Carbohydrate counting sheet  If problems or questions, patient to contact team via:  Phone and Email  Future DSME appointment: PRN

## 2015-04-07 MED FILL — OMEPRAZOLE DR 20 MG CAPSULE: 20 | 30 days supply | Qty: 30 | Fill #0

## 2015-04-22 MED FILL — DULoxetine HCL 60 MG CPEP: 60 | 30 days supply | Qty: 30 | Fill #7

## 2015-04-22 MED FILL — VICTOZA 18 MG/3 ML INJECT P: 18 | 30 days supply | Qty: 9 | Fill #1

## 2015-04-22 MED FILL — LANTUS 100 UNITS/ML VIAL: 100 | 90 days supply | Qty: 90 | Fill #2

## 2015-04-23 MED FILL — METFORMIN HCL ER 500 MG TAB: 500 | 90 days supply | Qty: 180 | Fill #1

## 2015-05-04 DIAGNOSIS — Z79899 Other long term (current) drug therapy: Secondary | ICD-10-CM | POA: Diagnosis not present

## 2015-05-04 DIAGNOSIS — E1165 Type 2 diabetes mellitus with hyperglycemia: Secondary | ICD-10-CM | POA: Diagnosis not present

## 2015-05-04 DIAGNOSIS — Z794 Long term (current) use of insulin: Secondary | ICD-10-CM | POA: Diagnosis not present

## 2015-05-04 DIAGNOSIS — Z5181 Encounter for therapeutic drug level monitoring: Secondary | ICD-10-CM | POA: Diagnosis not present

## 2015-05-04 DIAGNOSIS — Z862 Personal history of diseases of the blood and blood-forming organs and certain disorders involving the immune mechanism: Secondary | ICD-10-CM | POA: Diagnosis not present

## 2015-05-04 DIAGNOSIS — E113299 Type 2 diabetes mellitus with mild nonproliferative diabetic retinopathy without macular edema, unspecified eye: Secondary | ICD-10-CM | POA: Diagnosis not present

## 2015-05-07 MED FILL — OMEPRAZOLE DR 20 MG CAPSULE: 20 | 30 days supply | Qty: 30 | Fill #1

## 2015-05-07 MED FILL — VALSARTAN-HCTZ 320-25 MG TA: 320-25 | 90 days supply | Qty: 90 | Fill #1

## 2015-05-28 DIAGNOSIS — M25561 Pain in right knee: Secondary | ICD-10-CM | POA: Diagnosis not present

## 2015-05-28 DIAGNOSIS — Z96653 Presence of artificial knee joint, bilateral: Secondary | ICD-10-CM | POA: Diagnosis not present

## 2015-05-28 DIAGNOSIS — Z471 Aftercare following joint replacement surgery: Secondary | ICD-10-CM | POA: Diagnosis not present

## 2015-06-02 DIAGNOSIS — M25561 Pain in right knee: Secondary | ICD-10-CM | POA: Diagnosis not present

## 2015-06-04 MED FILL — OMEPRAZOLE DR 20 MG CAPSULE: 20 | 30 days supply | Qty: 30 | Fill #2

## 2015-06-04 MED FILL — DULoxetine HCL 60 MG CPEP: 60 | 30 days supply | Qty: 30 | Fill #8

## 2015-06-05 MED FILL — ATORVASTATIN 40 MG TABLET: 40 | 90 days supply | Qty: 90 | Fill #0

## 2015-06-09 ENCOUNTER — Ambulatory Visit: Payer: 59 | Attending: Orthopedic Surgery | Admitting: Physical Therapy

## 2015-06-09 DIAGNOSIS — M25561 Pain in right knee: Secondary | ICD-10-CM | POA: Insufficient documentation

## 2015-06-09 DIAGNOSIS — M6281 Muscle weakness (generalized): Secondary | ICD-10-CM | POA: Insufficient documentation

## 2015-06-09 MED FILL — AMLODIPINE BESYLATE 5 MG TA: 5 | 90 days supply | Qty: 90 | Fill #0

## 2015-06-09 NOTE — Patient Instructions (Signed)
    Abduction: Clam (Eccentric) - Side-Lying   Lie on side with knees bent. Lift top knee, keeping feet together. Keep trunk steady. Slowly lower for 3-5 seconds. 10___ reps per set, _1_ sets per day, 7___ days per week.   Oletha Tolson PT  [facility] [address], [address] [place], [place] [address] Phone # [phone] Fax [phone]  Copyright  VHI. All rights reserved.   

## 2015-06-09 NOTE — Therapy (Signed)
[facility] [address] [place], [place], [address] Phone: [phone]   Fax:  [phone]  Physical Therapy Treatment  Patient Details  Name: [name] MRN: [id number] Date of Birth: 29-Dec-1950 Referring Provider: Gerrit Halls  Encounter Date: 06/09/2015      PT End of Session - 06/09/15 2228    Visit Number 1   Number of Visits 10   Date for PT Re-Evaluation 08/04/15   Authorization Type [facility] UMR but of Medicare age (will apply G codes)   PT Start Time 1400   PT Stop Time 1445   PT Time Calculation (min) 45 min   Activity Tolerance Patient limited by pain      Past Medical History  Diagnosis Date  . Hypertension   . Arthritis   . History of kidney stones   . 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 Pinched nerve in neck   . Knee pain, right   . Swelling of knee joint, right   . Hyperlipidemia   . Hypothyroidism   . Diabetes mellitus [facility]  . Synovitis of knee RIGHT  . OSA on CPAP   . Osteoarthritis   . Depression   . GERD (gastroesophageal reflux disease)     occasional  . Pneumonia     hx of  . Anemia     hx of  . PONV (postoperative nausea and vomiting)     for 3-4 days after general anesthesia    Past Surgical History  Procedure Laterality Date  . Tonsillectomy and adenoidectomy  child  . Total knee arthroplasty  12-14-2009    RIGHT  . Pulley release left long finger  07-14-2009  . Left carpal tunnel / left middle & ring finger trigger release  08-26-2008  . Right carpal tunnel/ right thumb trigger release's  11-28-2006  . Shoulder arthroscopy w/ acromial repair  11-29-2005    LEFT  . Left shoulder arthroscopy/ left thumb trigger release  02-22-2005  . Shoulder arthroscopy distal clavicle excision and open rotator cuff repair  09-07-2004    LEFT  . Right shoulder arthroscopy w/ rotator cuff repair  01-13-2004  . Left shoulder arthroscopy w/ debridement  09-09-2003   . Cesarean section  X3  . Knee arthroscopy  04/20/2011    Procedure: ARTHROSCOPY KNEE;  Surgeon: Gearlean Alf, MD;  Location: [facility];  Service: Orthopedics;  Laterality: Right;  WITH SYNOVECTOMY  . Total knee arthroplasty Left 11/05/2012    Procedure: LEFT TOTAL KNEE ARTHROPLASTY;  Surgeon: Gearlean Alf, MD;  Location: [facility];  Service: Orthopedics;  Laterality: Left;  . Trigger finger release Right 02/21/2013    Procedure: RIGHT RING A-1 PULLEY RELEASE    (MINOR PROCEDURE) ;  Surgeon: Cammie Sickle., MD;  Location: [facility];  Service: Orthopedics;  Laterality: Right;    There were no vitals filed for this visit.  Visit Diagnosis:  Pain in right knee - Plan: PT plan of care cert/re-cert  Muscle weakness (generalized) - Plan: PT plan of care cert/re-cert      Subjective Assessment - 06/09/15 1406    Subjective 2011 right TKR, fell 2 months after onto both knees, neg bone scan but has bothered ever since then.  2013 had arthroscopy and synovectomy no improvement;  2nd bone scan.  Worsened in last 4 months.  Hurts to extend leg, pops several times a day.    States her knee clunks  and PA said may have to replace plastic knee component.  Has hinge brace but makes my leg hurt.   swim and aquatic 3x/week shallow  since October.  I do Tai Chi but worried about falling.     Pertinent History Diabetes, HTN ;  2013 left TKR;  hx of left hip bursitis   Limitations House hold activities;Walking;Standing   How long can you walk comfortably? slower getting moving    Diagnostic tests bone scans and x-rays   Patient Stated Goals I don't want to have surgery but lessen the pain;     Currently in Pain? Yes   Pain Score 4    Pain Location Knee   Pain Orientation Right   Pain Type Chronic pain   Pain Onset More than a month ago   Pain Frequency Constant   Aggravating Factors  sometimes extending knee, bending knee, night time   Pain Relieving Factors rubbing  knee, ice;             [facility] PT Assessment - 06/09/15 0001    Assessment   Medical Diagnosis right knee replacement    Referring Provider Gerrit Halls   Onset Date/Surgical Date --  4 months   Hand Dominance Right   Next MD Visit Dr. Binnie Rail 07/17/15   Prior Therapy immediate post op PT    Precautions   Precautions None   Restrictions   Weight Bearing Restrictions No   Balance Screen   Has the patient fallen in the past 6 months No   Has the patient had a decrease in activity level because of a fear of falling?  Yes   Is the patient reluctant to leave their home because of a fear of falling?  No   Home Environment   Living Environment Private residence   Living Arrangements Alone   Type of [place] Access Stairs to enter   Entrance Stairs-Number of Steps 4   Entrance Stairs-Rails Right   Home Layout One level   Additional Comments down steps more difficult   Prior Function   Level of Independence Independent   Vocation Full time employment  admitting walk and sit   Leisure yard work   Observation/Other Assessments   Focus on Therapeutic Outcomes (FOTO)  50% limited   Posture/Postural Control   Posture Comments posterior view popliteal fossa fulller in appearance   ROM / Strength   AROM / PROM / Strength AROM;Strength   AROM   AROM Assessment Site Knee   Right/Left Knee Right;Left   Right Knee Extension 0   Right Knee Flexion 119   Left Knee Extension 120   Left Knee Flexion 0   Strength   Strength Assessment Site Hip;Knee   Right/Left Hip Right;Left   Right Hip Flexion 4/5   Right Hip ABduction 3+/5   Left Hip Flexion 4+/5   Left Hip ABduction 4/5   Right/Left Knee Right;Left   Right Knee Flexion 4/5   Right Knee Extension 4-/5   Left Knee Flexion 4+/5   Left Knee Extension 4+/5   Palpation   Patella mobility decreased in all planes   Palpation comment tender points in quads, anterior tibialis; lateral crepitus right > left;  marked tenderness  in 1 spot medial right knee                             PT Education - 06/09/15 1446    Education provided  Yes   Education Details clams   Person(s) Educated Patient   Methods Explanation;Demonstration;Handout   Comprehension Verbalized understanding;Returned demonstration          PT Short Term Goals - 06/09/15 2251    PT SHORT TERM GOAL #1   Title The patient will have basic knowledge of HEP to increase muscle length and initiate strengthening to correct muscle imbalances  07/07/15   Time 4   Period Weeks   Status New   PT SHORT TERM GOAL #2   Title Pain improved by 25% with home and work ADLs   Time 4   Period Weeks   Status New   PT SHORT TERM GOAL #3   Title Patient will report a 25% improvement in night discomfort   Time 4   Period Weeks   Status New           PT Long Term Goals - 06/09/15 2254    PT LONG TERM GOAL #1   Title The patient will be independent in HEP and self management techniques   08/04/15   Time 8   Period Weeks   Status New   PT LONG TERM GOAL #2   Title The patient will have improved left hip abd strength to 4-/5 needed for standing/walking longer periods of time    Time 8   Period Weeks   Status New   PT LONG TERM GOAL #3   Title The patient will have improved quad strength to 4+/5 needed for descending steps with greater ease   Time 8   Period Weeks   Status New   PT LONG TERM GOAL #4   Title The patient will report a 50% improvement in pain with home and work tasks    Time 8   Period Weeks   Status New   PT LONG TERM GOAL #5   Title FOTO functional outcome score improved from 50% to 44% indicating improved function with less pain   Time 8   Period Weeks   Status On-going               Plan - 06/09/15 2230    Clinical Impression Statement The patient is of moderate complexity evaluation.  She had right TKR in 2011.  2 months after surgery she fell on both knees.  Since that time she has continued  to have a painful spot medial right knee. often with straightening her knee out or rotating her leg in or out.  She reports popping in her knee as well and difficulty going down steps, feeling as if her knee may buckle.  She reports she has had numerous x-rays and bone scans which were all normal.  She has a specific spot along the medial right knee joint line and lateral side crepitus with passive and active knee flexion/ extension.  Tender points right quads and anterior tibialis.  Decreased gluteal and quad muscle strength.  Decreased psoas length.    Decreased patellar mobility.  AROM 0-119 degrees, right (left 0-120 degrees) .  She would benefit from PT to address these deficits.     Pt will benefit from skilled therapeutic intervention in order to improve on the following deficits Pain;Decreased strength;Difficulty walking;Increased muscle spasms;Impaired flexibility   Rehab Potential Good   PT Frequency 2x / week   PT Duration 8 weeks   PT Treatment/Interventions Cryotherapy;Electrical Stimulation;Moist Heat;Iontophoresis '4mg'$ /ml Dexamethasone;Therapeutic exercise;Neuromuscular re-education;Patient/family education;Manual techniques;Taping;Dry needling   PT Next Visit Plan dry needling quads, glut med strengthening;  manual soft tissue;  knee joint mobs, taping;  quad strengthening           G-Codes - 06-11-2015 2300    Functional Assessment Tool Used FOTO;clinical judgement   Functional Limitation Mobility: Walking and moving around   Mobility: Walking and Moving Around Current Status [phone]) At least 40 percent but less than 60 percent impaired, limited or restricted   Mobility: Walking and Moving Around Goal Status [phone]) At least 40 percent but less than 60 percent impaired, limited or restricted      Problem List Patient Active Problem List   Diagnosis Date Noted  . Diabetes ([place]) 01/08/2014  . Essential hypertension 01/08/2014  . Hyperlipidemia 01/08/2014  . Postoperative anemia  due to acute blood loss 11/22/2012  . Hyponatremia 11/06/2012  . OA (osteoarthritis) of knee 11/05/2012  . 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 [place], [place], [address] Phone: [phone]   Fax:  [phone]  Name: [name] MRN: [id number] Date of Birth: [birth year]    Ruben Im, PT 06-11-2015 11:09 PM Phone: [phone] Fax: [phone]

## 2015-06-15 ENCOUNTER — Ambulatory Visit: Payer: 59 | Attending: Orthopedic Surgery | Admitting: Physical Therapy

## 2015-06-15 ENCOUNTER — Encounter: Payer: Self-pay | Admitting: Physical Therapy

## 2015-06-15 DIAGNOSIS — M25561 Pain in right knee: Secondary | ICD-10-CM | POA: Diagnosis not present

## 2015-06-15 DIAGNOSIS — M6281 Muscle weakness (generalized): Secondary | ICD-10-CM | POA: Diagnosis not present

## 2015-06-15 NOTE — Therapy (Signed)
[facility] [address] [place], [place], [address] Phone: [phone]   Fax:  [phone]  Physical Therapy Treatment  Patient Details  Name: Stefanie DeemBonnie D Ohaver MRN: [id number] Date of Birth: [birth year] Referring Provider: Jodene NamStephen Chabon  Encounter Date: 06/15/2015      PT End of Session - 06/15/15 1549    Visit Number 2   Number of Visits 10   Date for PT Re-Evaluation 08/04/15   Authorization Type [facility] U[facility] but of Medicare age (will apply G codes)   PT Start Time 1528   PT Stop Time 1632   PT Time Calculation (min) 64 min   Activity Tolerance Patient limited by pain   Behavior During Therapy [facility] for tasks assessed/performed      Past Medical History  Diagnosis Date  . Hypertension   . Arthritis   . History of kidney stones   . 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 Pinched nerve in neck   . Knee pain, right   . Swelling of knee joint, right   . Hyperlipidemia   . Hypothyroidism   . Diabetes mellitus [facility]  . Synovitis of knee RIGHT  . OSA on CPAP   . Osteoarthritis   . Depression   . GERD (gastroesophageal reflux disease)     occasional  . Pneumonia     hx of  . Anemia     hx of  . PONV (postoperative nausea and vomiting)     for 3-4 days after general anesthesia    Past Surgical History  Procedure Laterality Date  . Tonsillectomy and adenoidectomy  child  . Total knee arthroplasty  12-14-2009    RIGHT  . Pulley release left long finger  07-14-2009  . Left carpal tunnel / left middle & ring finger trigger release  08-26-2008  . Right carpal tunnel/ right thumb trigger release's  11-28-2006  . Shoulder arthroscopy w/ acromial repair  11-29-2005    LEFT  . Left shoulder arthroscopy/ left thumb trigger release  02-22-2005  . Shoulder arthroscopy distal clavicle excision and open rotator cuff repair  09-07-2004    LEFT  . Right shoulder arthroscopy w/ rotator cuff repair   01-13-2004  . Left shoulder arthroscopy w/ debridement  09-09-2003  . Cesarean section  X3  . Knee arthroscopy  04/20/2011    Procedure: ARTHROSCOPY KNEE;  Surgeon: Loanne DrillingFrank V Aluisio, MD;  [facility];  Service: Orthopedics;  Laterality: Right;  WITH SYNOVECTOMY  . Total knee arthroplasty Left 11/05/2012    Procedure: LEFT TOTAL KNEE ARTHROPLASTY;  Surgeon: Loanne DrillingFrank V Aluisio, MD;  Location: [facility];  Service: Orthopedics;  Laterality: Left;  . Trigger finger release Right 02/21/2013    Procedure: RIGHT RING A-1 PULLEY RELEASE    (MINOR PROCEDURE) ;  Surgeon: Wyn Forsterobert V Sypher Jr., MD;  [facility];  Service: Orthopedics;  Laterality: Right;    There were no vitals filed for this visit.  Visit Diagnosis:  Pain in right knee  Muscle weakness (generalized)      Subjective Assessment - 06/15/15 1540    Subjective 2011 right TKR. 2013 had arthroscopy and synovectomy no improvment, 2nd bone scan. Worsened in last 4 month. Bending with ambulation , or stepping on the brakes.   Pertinent History Diabetes, HTN ;  2013 left TKR;  hx of left hip bursitis   Limitations House hold activities;Walking;Standing   How long can you walk comfortably? slower  getting moving    Diagnostic tests bone scans and x-rays   Patient Stated Goals I don't want to have surgery but lessen the pain;     Currently in Pain? Yes   Pain Score 4    Pain Location Knee   Pain Orientation Right   Pain Descriptors / Indicators Aching  annoing   Pain Type Chronic pain   Pain Onset More than a month ago   Pain Frequency Constant   Aggravating Factors  bending knees, night time, breaking breaks   Pain Relieving Factors rubbing knee, ice   Multiple Pain Sites No                         [facility] Adult PT Treatment/Exercise - 06/15/15 0001    Posture/Postural Control   Posture Comments posterior view popliteal fossa fulller in appearance   Exercises   Exercises  Knee/Hip   Knee/Hip Exercises: Stretches   Active Hamstring Stretch Right;3 reps;20 seconds  using green strap   Other Knee/Hip Stretches Butterfly stretch x 3 with 20   Knee/Hip Exercises: Aerobic   Nustep L 1 x   Knee/Hip Exercises: Supine   Heel Slides AROM;2 sets;10 reps  Rt LE   Knee/Hip Exercises: Sidelying   Hip ADduction Strengthening;3 sets;10 reps  in left sidelying, challenging for pt. due to weakness   Clams 3 x 10  in Lt sidelying, challenging due to weakness   Modalities   Modalities Electrical Stimulation;Moist Heat   Moist Heat Therapy   Number Minutes Moist Heat 15 Minutes   Electrical Stimulation   Electrical Stimulation Location right posterior thight   Electrical Stimulation Action IFC   Electrical Stimulation Parameters to pt's tolerance   Electrical Stimulation Goals Pain;Tone   Manual Therapy   Manual Therapy Soft tissue mobilization;Joint mobilization   Manual therapy comments to distal hamstrings and gastrocnemius                  PT Short Term Goals - 06/15/15 1553    PT SHORT TERM GOAL #1   Title The patient will have basic knowledge of HEP to increase muscle length and initiate strengthening to correct muscle imbalances  07/07/15   Time 4   Period Weeks   Status On-going   PT SHORT TERM GOAL #2   Title Pain improved by 25% with home and work ADLs   Time 4   Period Weeks   Status On-going   PT SHORT TERM GOAL #3   Title Patient will report a 25% improvement in night discomfort   Time 4   Period Weeks   Status On-going           PT Long Term Goals - 06/09/15 2254    PT LONG TERM GOAL #1   Title The patient will be independent in HEP and self management techniques   08/04/15   Time 8   Period Weeks   Status New   PT LONG TERM GOAL #2   Title The patient will have improved left hip abd strength to 4-/5 needed for standing/walking longer periods of time    Time 8   Period Weeks   Status New   PT LONG TERM GOAL #3    Title The patient will have improved quad strength to 4+/5 needed for descending steps with greater ease   Time 8   Period Weeks   Status New   PT LONG TERM GOAL #4   Title  The patient will report a 50% improvement in pain with home and work tasks    Time 8   Period Weeks   Status New   PT LONG TERM GOAL #5   Title FOTO functional outcome score improved from 50% to 44% indicating improved function with less pain   Time 8   Period Weeks   Status On-going               Plan - 06/15/15 1552    Clinical Impression Statement Pt able to use the Nu-step. Hamstrings, quadriceps and gastrocnemius are tight and with TP on med side. Pt will benefit from skilled PT to adress pain, and increase strength.    Pt will benefit from skilled therapeutic intervention in order to improve on the following deficits Pain;Decreased strength;Difficulty walking;Increased muscle spasms;Impaired flexibility   Rehab Potential Good   PT Frequency 2x / week   PT Duration 8 weeks   PT Treatment/Interventions Cryotherapy;Electrical Stimulation;Moist Heat;Iontophoresis /ml Dexamethasone;Therapeutic exercise;Neuromuscular re-education;Patient/family education;Manual techniques;Taping;Dry needling   PT Next Visit Plan See if [place] is signed. dry needling quads, glut med strengthening; manual soft tissue;  knee joint mobs, taping;  quad strengthening    Consulted and Agree with Plan of Care Patient        Problem List Patient Active Problem List   Diagnosis Date Noted  . Diabetes ([facility]) 01/08/2014  . Essential hypertension 01/08/2014  . Hyperlipidemia 01/08/2014  . Postoperative anemia due to acute blood loss 11/22/2012  . Hyponatremia 11/06/2012  . OA (osteoarthritis) of knee 11/05/2012  . 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 [place], [place],  [address] Phone: [phone]   Fax:  [phone]  Name: ZURI LASCALA MRN: [id number] Date of Birth: 1950-06-16

## 2015-06-18 ENCOUNTER — Ambulatory Visit: Payer: 59

## 2015-06-18 DIAGNOSIS — M6281 Muscle weakness (generalized): Secondary | ICD-10-CM | POA: Diagnosis not present

## 2015-06-18 DIAGNOSIS — M25561 Pain in right knee: Secondary | ICD-10-CM

## 2015-06-18 NOTE — Patient Instructions (Signed)
Hip Flexor Stretch    Lying on back near edge of bed, bend one leg, foot flat. Hang other leg over edge, relaxed, thigh resting entirely on bed for 20 seconds. Repeat __3__ times. Do __3__ sessions per day. Advanced Exercise: Bend knee back keeping thigh in contact with bed.  http://gt2.exer.us/347   Copyright  VHI. All rights reserved.  Gastroc / Heel Cord Stretch - On Step    Stand with heels over edge of stair. Holding rail, lower heels until stretch is felt in calf of legs. Hold 10 seconds Repeat _10__ times. Do _2__ times per day.  Copyright  VHI. All rights reserved.   Achilles Tendon Stretch    Stand with hands supported on wall, elbows slightly bent, feet parallel and both heels on floor, front knee bent, back knee straight. Slowly relax back knee until a stretch is felt in achilles tendon. Hold __10__ seconds. Repeat with leg positions switched.  Do 3-5 reps.  2 times a day Copyright  VHI. All rights reserved.   Trigger Point Dry Needling  . What is Trigger Point Dry Needling (DN)? o DN is a physical therapy technique used to treat muscle pain and dysfunction. Specifically, DN helps deactivate muscle trigger points (muscle knots).  o A thin filiform needle is used to penetrate the skin and stimulate the underlying trigger point. The goal is for a local twitch response (LTR) to occur and for the trigger point to relax. No medication of any kind is injected during the procedure.   . What Does Trigger Point Dry Needling Feel Like?  o The procedure feels different for each individual patient. Some patients report that they do not actually feel the needle enter the skin and overall the process is not painful. Very mild bleeding may occur. However, many patients feel a deep cramping in the muscle in which the needle was inserted. This is the local twitch response.   [name]. How Will I feel after the treatment? o Soreness is normal, and the onset of soreness may not occur for a  few hours. Typically this soreness does not last longer than two days.  o Bruising is uncommon, however; ice can be used to decrease any possible bruising.  o In rare cases feeling tired or nauseous after the treatment is normal. In addition, your symptoms may get worse before they get better, this period will typically not last longer than 24 hours.   . What Can I do After My Treatment? o Increase your hydration by drinking more water for the next 24 hours. o You may place ice or heat on the areas treated that have become sore, however, do not use heat on inflamed or bruised areas. Heat often brings more relief post needling. o You can continue your regular activities, but vigorous activity is not recommended initially after the treatment for 24 hours. o DN is best combined with other physical therapy such as strengthening, stretching, and other therapies.    [facility] [address], [address] [place], [place] [address] Phone # [phone] Fax [phone]

## 2015-06-18 NOTE — Therapy (Signed)
[facility] [address] [place], [place], [address] Phone: [phone]   Fax:  [phone]  Physical Therapy Treatment  Patient Details  Name: [name] MRN: [id number] Date of Birth: 10/13/1950 Referring Provider: Jodene Nam  Encounter Date: 06/18/2015      PT End of Session - 06/18/15 1623    Visit Number 3   Number of Visits 10   Date for PT Re-Evaluation 08/04/15   Authorization Type [facility] U[facility] but of Medicare age (will apply G codes)   PT Start Time 1527/07/26   PT Stop Time 1627-07-26   PT Time Calculation (min) 60 min   Activity Tolerance Patient tolerated treatment well   Behavior During Therapy [facility] for tasks assessed/performed      Past Medical History  Diagnosis Date  . Hypertension   . Arthritis   . History of kidney stones   . 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 Pinched nerve in neck   . Knee pain, right   . Swelling of knee joint, right   . Hyperlipidemia   . Hypothyroidism   . Diabetes mellitus ORAL MEDS  . Synovitis of knee RIGHT  . OSA on CPAP   . Osteoarthritis   . Depression   . GERD (gastroesophageal reflux disease)     occasional  . Pneumonia     hx of  . Anemia     hx of  . PONV (postoperative nausea and vomiting)     for 3-4 days after general anesthesia    Past Surgical History  Procedure Laterality Date  . Tonsillectomy and adenoidectomy  child  . Total knee arthroplasty  12-14-2009    RIGHT  . Pulley release left long finger  07-14-2009  . Left carpal tunnel / left middle & ring finger trigger release  08-26-2008  . Right carpal tunnel/ right thumb trigger release's  11-28-2006  . Shoulder arthroscopy w/ acromial repair  11-29-2005    LEFT  . Left shoulder arthroscopy/ left thumb trigger release  02-22-2005  . Shoulder arthroscopy distal clavicle excision and open rotator cuff repair  09-07-2004    LEFT  . Right shoulder arthroscopy w/ rotator cuff repair   01-13-2004  . Left shoulder arthroscopy w/ debridement  09-09-2003  . Cesarean section  X3  . Knee arthroscopy  04/20/2011    Procedure: ARTHROSCOPY KNEE;  Surgeon: Loanne Drilling, MD;  Location: [facility];  Service: Orthopedics;  Laterality: Right;  WITH SYNOVECTOMY  . Total knee arthroplasty Left 11/05/2012    Procedure: LEFT TOTAL KNEE ARTHROPLASTY;  Surgeon: Loanne Drilling, MD;  Location: [facility];  Service: Orthopedics;  Laterality: Left;  . Trigger finger release Right 02/21/2013    Procedure: RIGHT RING A-1 PULLEY RELEASE    (MINOR PROCEDURE) ;  Surgeon: Wyn Forster., MD;  Location: [facility];  Service: Orthopedics;  Laterality: Right;    There were no vitals filed for this visit.  Visit Diagnosis:  Muscle weakness (generalized)  Pain in right knee      Subjective Assessment - 06/18/15 1537    Subjective I'm having a bad day today.     Pertinent History Diabetes, HTN ;  2011/07/26 left TKR;  hx of left hip bursitis   Patient Stated Goals I don't want to have surgery but lessen the pain;     Currently in Pain? Yes   Pain Score 5    Pain Location Knee  Pain Orientation Right   Pain Descriptors / Indicators Aching  annoying   Pain Type Chronic pain   Pain Onset More than a month ago   Pain Frequency Constant   Aggravating Factors  bending knees, night time   Pain Relieving Factors rest, rubbing the knee, ice                         [facility] Adult PT Treatment/Exercise - 06/18/15 0001    Knee/Hip Exercises: Stretches   Active Hamstring Stretch Right;3 reps;20 seconds  using green strap   Other Knee/Hip Stretches Butterfly stretch x 3 with 20   Other Knee/Hip Stretches gastroc stretches at wall and using step 10 secondsx 5 each   Knee/Hip Exercises: Aerobic   Nustep Level 2 x 8 minutes  seat 6, arms 8   Knee/Hip Exercises: Sidelying   Clams 3 x 10  in Lt sidelying, challenging due to weakness   Modalities   Modalities  Electrical Stimulation   Moist Heat Therapy   Number Minutes Moist Heat 10 Minutes   Electrical Stimulation   Electrical Stimulation Location quads and adductors on Rt   Electrical Stimulation Action IFC   Electrical Stimulation Parameters 10 minutes   Electrical Stimulation Goals Pain   Manual Therapy   Manual Therapy Soft tissue mobilization   Manual therapy comments quads, tibialis anterior with mobilization, elongation and trigger point release.            Trigger Point Dry Needling - 06/18/15 1551    Consent Given? Yes   Education Handout Provided Yes   Muscles Treated Lower Body Tibialis anterior;Quadriceps;Adductor longus/brevius/maximus   Quadriceps Response Palpable increased muscle length   Adductor Response Palpable increased muscle length   Tibialis Anterior Response Twitch response elicited;Palpable increased muscle length              PT Education - 06/18/15 1554    Education provided Yes   Education Details HEP and DN   Person(s) Educated Patient   Methods Explanation;Demonstration;Handout   Comprehension Verbalized understanding;Returned demonstration          PT Short Term Goals - 06/15/15 1553    PT SHORT TERM GOAL #1   Title The patient will have basic knowledge of HEP to increase muscle length and initiate strengthening to correct muscle imbalances  07/07/15   Time 4   Period Weeks   Status On-going   PT SHORT TERM GOAL #2   Title Pain improved by 25% with home and work ADLs   Time 4   Period Weeks   Status On-going   PT SHORT TERM GOAL #3   Title Patient will report a 25% improvement in night discomfort   Time 4   Period Weeks   Status On-going           PT Long Term Goals - 06/09/15 2254    PT LONG TERM GOAL #1   Title The patient will be independent in HEP and self management techniques   08/04/15   Time 8   Period Weeks   Status New   PT LONG TERM GOAL #2   Title The patient will have improved left hip abd strength to 4-/5  needed for standing/walking longer periods of time    Time 8   Period Weeks   Status New   PT LONG TERM GOAL #3   Title The patient will have improved quad strength to 4+/5 needed for descending steps with greater  ease   Time 8   Period Weeks   Status New   PT LONG TERM GOAL #4   Title The patient will report a 50% improvement in pain with home and work tasks    Time 8   Period Weeks   Status New   PT LONG TERM GOAL #5   Title FOTO functional outcome score improved from 50% to 44% indicating improved function with less pain   Time 8   Period Weeks   Status On-going               Plan - 06/18/15 1539    Clinical Impression Statement Pt with trigger points along the Rt quads, tibaliis anterior and hamstrings.  Pt had first DN session and had decreased muscle tension in quads after session.  Adductor area is extremely sensitive to touch with palpation and soft tissue work today.  Pt with continued hip and knee weakness and will benefit from skilled PT for strength progression, manual for muscle tension and flexibility.   Pt will benefit from skilled therapeutic intervention in order to improve on the following deficits Pain;Decreased strength;Difficulty walking;Increased muscle spasms;Impaired flexibility   Rehab Potential Good   PT Frequency 2x / week   PT Duration 8 weeks   PT Treatment/Interventions Cryotherapy;Electrical Stimulation;Moist Heat;Iontophoresis /ml Dexamethasone;Therapeutic exercise;Neuromuscular re-education;Patient/family education;Manual techniques;Taping;Dry needling   PT Next Visit Plan assess response to dry needling, glut medius strength, soft tissue, gastroc and hamstring flexiblity        Problem List Patient Active Problem List   Diagnosis Date Noted  . Diabetes (HCC) 01/08/2014  . Essential hypertension 01/08/2014  . Hyperlipidemia 01/08/2014  . Postoperative anemia due to acute blood loss 11/22/2012  . Hyponatremia 11/06/2012  . OA  (osteoarthritis) of knee 11/05/2012  . 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 [place], [place], [address] Phone: [phone]   Fax:  [phone]  Name: [name] MRN: [id number] Date of Birth: 10/17/1950

## 2015-06-22 ENCOUNTER — Ambulatory Visit: Payer: 59 | Admitting: Physical Therapy

## 2015-06-22 ENCOUNTER — Encounter: Payer: Self-pay | Admitting: Physical Therapy

## 2015-06-22 DIAGNOSIS — M25561 Pain in right knee: Secondary | ICD-10-CM | POA: Diagnosis not present

## 2015-06-22 DIAGNOSIS — M6281 Muscle weakness (generalized): Secondary | ICD-10-CM | POA: Diagnosis not present

## 2015-06-22 NOTE — Therapy (Signed)
[facility] [address], [address] [place], [place], [address] Phone: [phone]   Fax:  [phone]  Physical Therapy Treatment  Patient Details  Name: Alinda DeemBonnie D Frieden MRN: [id number] Date of Birth: January 29, 1951 Referring Provider: Jodene NamStephen Chabon  Encounter Date: 06/22/2015      PT End of Session - 06/22/15 1544    Visit Number 4   Number of Visits 10   Date for PT Re-Evaluation 08/04/15   Authorization Type [facility] UMR but of Medicare age (will apply G codes)   PT Start Time 1531   PT Stop Time 1632   PT Time Calculation (min) 61 min   Activity Tolerance Patient tolerated treatment well   Behavior During Therapy [facility] for tasks assessed/performed      Past Medical History  Diagnosis Date  . Hypertension   . Arthritis   . History of kidney stones   . 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 Pinched nerve in neck   . Knee pain, right   . Swelling of knee joint, right   . Hyperlipidemia   . Hypothyroidism   . Diabetes mellitus [facility]AL [facility]  . Synovitis of knee RIGHT  . OSA on CPAP   . Osteoarthritis   . Depression   . GERD (gastroesophageal reflux disease)     occasional  . Pneumonia     hx of  . Anemia     hx of  . PONV (postoperative nausea and vomiting)     for 3-4 days after general anesthesia    Past Surgical History  Procedure Laterality Date  . Tonsillectomy and adenoidectomy  child  . Total knee arthroplasty  12-14-2009    RIGHT  . Pulley release left long finger  07-14-2009  . Left carpal tunnel / left middle & ring finger trigger release  08-26-2008  . Right carpal tunnel/ right thumb trigger release's  11-28-2006  . Shoulder arthroscopy w/ acromial repair  11-29-2005    LEFT  . Left shoulder arthroscopy/ left thumb trigger release  02-22-2005  . Shoulder arthroscopy distal clavicle excision and open rotator cuff repair  09-07-2004    LEFT  . Right shoulder arthroscopy w/ rotator cuff repair   01-13-2004  . Left shoulder arthroscopy w/ debridement  09-09-2003  . Cesarean section  X3  . Knee arthroscopy  04/20/2011    Procedure: ARTHROSCOPY KNEE;  Surgeon: Loanne DrillingFrank V Aluisio, MD;  [facility];  Service: Orthopedics;  Laterality: Right;  WITH SYNOVECTOMY  . Total knee arthroplasty Left 11/05/2012    Procedure: LEFT TOTAL KNEE ARTHROPLASTY;  Surgeon: Loanne DrillingFrank V Aluisio, MD;  Location: [facility];  Service: Orthopedics;  Laterality: Left;  . Trigger finger release Right 02/21/2013    Procedure: RIGHT RING A-1 PULLEY RELEASE    (MINOR PROCEDURE) ;  Surgeon: Wyn Forsterobert V Sypher Jr., MD;  [facility];  Service: Orthopedics;  Laterality: Right;    There were no vitals filed for this visit.      Subjective Assessment - 06/22/15 1537    Subjective Today my left knee is hurting today, in addition to the right knee   Pertinent History Diabetes, HTN ;  2011 right knee TKR, 2013 left TKR;  hx of left hip bursitis   Limitations House hold activities;Walking;Standing   How long can you walk comfortably? slower getting moving    Diagnostic tests bone scans and x-rays   Patient Stated Goals I don't want to have surgery  but lessen the pain;     Currently in Pain? Yes   Pain Score 5    Pain Location Knee   Pain Orientation Right   Pain Descriptors / Indicators Aching   Pain Type Chronic pain   Pain Onset More than a month ago   Pain Frequency Constant   Aggravating Factors  bending knees, night time   Pain Relieving Factors rest, rubbing the knee, ice   Multiple Pain Sites Yes   Pain Score 6   Pain Location Knee   Pain Orientation Left   Pain Descriptors / Indicators Sharp   Pain Type Chronic pain   Pain Onset More than a month ago                         [facility] Adult PT Treatment/Exercise - 06/22/15 0001    Posture/Postural Control   Posture Comments posterior view popliteal fossa fuller in appearance   Exercises   Exercises  Knee/Hip   Knee/Hip Exercises: Stretches   Active Hamstring Stretch Right;3 reps;20 seconds  on stairs   Knee/Hip Exercises: Aerobic   Stationary Bike L 1 x   Knee/Hip Exercises: Supine   Other Supine Knee/Hip Exercises iliopsoas stretch in thomasgrip each leg x 3 using towel   Knee/Hip Exercises: Sidelying   Clams 3 x 10   Modalities   Modalities Electrical Stimulation   Moist Heat Therapy   Number Minutes Moist Heat 15 Minutes   Electrical Stimulation   Electrical Stimulation Location quads and adductors on Rt   Electrical Stimulation Action IFC   Electrical Stimulation Parameters 15   Electrical Stimulation Goals Pain   Manual Therapy   Manual Therapy Soft tissue mobilization   Manual therapy comments quads, tibialis anterior with mobilization, elongation and trigger point release.                    PT Short Term Goals - 06/22/15 1552    PT SHORT TERM GOAL #1   Title The patient will have basic knowledge of HEP to increase muscle length and initiate strengthening to correct muscle imbalances  07/07/15   Time 4   Period Weeks   Status On-going   PT SHORT TERM GOAL #2   Title Pain improved by 25% with home and work ADLs   Time 4   Period Weeks   Status On-going   PT SHORT TERM GOAL #3   Title Patient will report a 25% improvement in night discomfort   Time 4   Period Weeks   Status On-going           PT Long Term Goals - 06/09/15 2254    PT LONG TERM GOAL #1   Title The patient will be independent in HEP and self management techniques   08/04/15   Time 8   Period Weeks   Status New   PT LONG TERM GOAL #2   Title The patient will have improved left hip abd strength to 4-/5 needed for standing/walking longer periods of time    Time 8   Period Weeks   Status New   PT LONG TERM GOAL #3   Title The patient will have improved quad strength to 4+/5 needed for descending steps with greater ease   Time 8   Period Weeks   Status New   PT LONG TERM  GOAL #4   Title The patient will report a 50% improvement in pain with home and  work tasks    Time 8   Period Weeks   Status New   PT LONG TERM GOAL #5   Title FOTO functional outcome score improved from 50% to 44% indicating improved function with less pain   Time 8   Period Weeks   Status On-going               Plan - 06/22/15 1550    Clinical Impression Statement Pt continues to complain of pain in Rt knee. Pt with TP along Rt quads, tibialis anterior and hamstring. Pt with hip and knee weakness and will benefit from skilled PT for strengt progresiong and flexibility, manual for muscle tension and DN since that helped for at least two days.   Rehab Potential Good   PT Frequency 2x / week   PT Duration 8 weeks   PT Treatment/Interventions Cryotherapy;Electrical Stimulation;Moist Heat;Iontophoresis /ml Dexamethasone;Therapeutic exercise;Neuromuscular re-education;Patient/family education;Manual techniques;Taping;Dry needling   PT Next Visit Plan assess response to dry needling, glut medius strength, soft tissue, gastroc and hamstring flexiblity   Consulted and Agree with Plan of Care Patient      Patient will benefit from skilled therapeutic intervention in order to improve the following deficits and impairments:  Pain, Decreased strength, Difficulty walking, Increased muscle spasms, Impaired flexibility  Visit Diagnosis: Muscle weakness (generalized)  Pain in right knee     Problem List Patient Active Problem List   Diagnosis Date Noted  . Diabetes (HCC) 01/08/2014  . Essential hypertension 01/08/2014  . Hyperlipidemia 01/08/2014  . Postoperative anemia due to acute blood loss 11/22/2012  . Hyponatremia 11/06/2012  . OA (osteoarthritis) of knee 11/05/2012  . 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, [address] [place],  [place], [address] Phone: [phone]   Fax:  [phone]  Name: TIMOTHY TOWNSEL MRN: [id number] Date of Birth: [birth year]

## 2015-06-25 ENCOUNTER — Ambulatory Visit: Payer: 59 | Admitting: Physical Therapy

## 2015-06-25 DIAGNOSIS — M25561 Pain in right knee: Secondary | ICD-10-CM | POA: Diagnosis not present

## 2015-06-25 DIAGNOSIS — M6281 Muscle weakness (generalized): Secondary | ICD-10-CM

## 2015-06-25 NOTE — Therapy (Signed)
[facility] [address], [address] [place], [place], [address] Phone: [phone]   Fax:  [phone]  Physical Therapy Treatment  Patient Details  Name: [name] MRN: [id number] Date of Birth: [birth year] Referring Provider: Jodene Nam  Encounter Date: 06/25/2015      PT End of Session - 06/25/15 1706    Visit Number 5   Number of Visits 10   Date for PT Re-Evaluation 08/04/15   Authorization Type [facility] UMR but of Medicare age (will apply G codes)   PT Start Time 08-12-1613   PT Stop Time 1715   PT Time Calculation (min) 60 min   Activity Tolerance Patient tolerated treatment well      Past Medical History  Diagnosis Date  . Hypertension   . Arthritis   . History of kidney stones   . 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 Pinched nerve in neck   . Knee pain, right   . Swelling of knee joint, right   . Hyperlipidemia   . Hypothyroidism   . Diabetes mellitus ORAL MEDS  . Synovitis of knee RIGHT  . OSA on CPAP   . Osteoarthritis   . Depression   . GERD (gastroesophageal reflux disease)     occasional  . Pneumonia     hx of  . Anemia     hx of  . PONV (postoperative nausea and vomiting)     for 3-4 days after general anesthesia    Past Surgical History  Procedure Laterality Date  . Tonsillectomy and adenoidectomy  child  . Total knee arthroplasty  12-14-2009    RIGHT  . Pulley release left long finger  08/12/09  . Left carpal tunnel / left middle & ring finger trigger release  08-26-2008  . Right carpal tunnel/ right thumb trigger release's  11-28-2006  . Shoulder arthroscopy w/ acromial repair  11-29-2005    LEFT  . Left shoulder arthroscopy/ left thumb trigger release  02-22-2005  . Shoulder arthroscopy distal clavicle excision and open rotator cuff repair  09-07-2004    LEFT  . Right shoulder arthroscopy w/ rotator cuff repair  01-13-2004  . Left shoulder arthroscopy w/ debridement   09-09-2003  . Cesarean section  X3  . Knee arthroscopy  04/20/2011    Procedure: ARTHROSCOPY KNEE;  Surgeon: Loanne Drilling, MD;  [facility];  Service: Orthopedics;  Laterality: Right;  WITH SYNOVECTOMY  . Total knee arthroplasty Left 11/05/2012    Procedure: LEFT TOTAL KNEE ARTHROPLASTY;  Surgeon: Loanne Drilling, MD;  Location: [facility];  Service: Orthopedics;  Laterality: Left;  . Trigger finger release Right 02/21/2013    Procedure: RIGHT RING A-1 PULLEY RELEASE    (MINOR PROCEDURE) ;  Surgeon: Wyn Forster., MD;  Location: [facility];  Service: Orthopedics;  Laterality: Right;    There were no vitals filed for this visit.      Subjective Assessment - 06/25/15 1615    Subjective After last visit, my knees hurt so bad, unable to determine aggravating factor ? exercise or soft tissue.  Thinks dry needling might have helped some, felt better x 2 days.  Did some pool ex last night and added some stuff.  Tight feeling with inferior knee pain.  The brace hurts after I wear it too long.   Currently in Pain? Yes   Pain Score 5    Pain Location Knee   Pain Orientation  Right;Left   Pain Type Chronic pain   Pain Frequency Constant                         [facility] Adult PT Treatment/Exercise - 06/25/15 0001    Knee/Hip Exercises: Aerobic   Stationary Bike L 1 x 8min   Knee/Hip Exercises: Standing   Gait Training Trendelenberg glut ex 10x R/L   Other Standing Knee Exercises psoas door way stretches with UE reaches 15x R/L   Other Standing Knee Exercises UE wall slides with glut squeeze and wall climbs 10x each   Moist Heat Therapy   Number Minutes Moist Heat 10 Minutes   Moist Heat Location Knee  right   Manual Therapy   Manual Therapy Soft tissue mobilization;Taping   Manual therapy comments foam roll rectus soft tissue   Soft tissue mobilization VMO, VL soft tissue   McConnell fat pad release          Trigger Point Dry  Needling - 06/25/15 1704    Consent Given? Yes   Muscles Treated Lower Body Quadriceps  VMO, rectus, VL   Quadriceps Response Twitch response elicited;Palpable increased muscle length     Right only           PT Short Term Goals - 06/25/15 1712    PT SHORT TERM GOAL #1   Title The patient will have basic knowledge of HEP to increase muscle length and initiate strengthening to correct muscle imbalances  07/07/15   Status Achieved   PT SHORT TERM GOAL #2   Title Pain improved by 25% with home and work ADLs   Time 4   Period Weeks   Status On-going   PT SHORT TERM GOAL #3   Title Patient will report a 25% improvement in night discomfort   Time 4   Period Weeks   Status On-going           PT Long Term Goals - 06/25/15 1712    PT LONG TERM GOAL #1   Title The patient will be independent in HEP and self management techniques   08/04/15   Time 8   Period Weeks   Status On-going   PT LONG TERM GOAL #2   Title The patient will have improved left hip abd strength to 4-/5 needed for standing/walking longer periods of time    Time 8   Period Weeks   Status On-going   PT LONG TERM GOAL #3   Title The patient will have improved quad strength to 4+/5 needed for descending steps with greater ease   Time 8   Period Weeks   Status On-going   PT LONG TERM GOAL #4   Title The patient will report a 50% improvement in pain with home and work tasks    Time 8   Period Weeks   Status On-going   PT LONG TERM GOAL #5   Title FOTO functional outcome score improved from 50% to 44% indicating improved function with less pain   Time 8   Period Weeks   Status On-going               Plan - 06/25/15 1707    Clinical Impression Statement Decreased soft tissue length psoas and gastroc.  Multiple tender points in right rectus femoris, vastus lateralis and vastus medialis.  Decreased right glute medius strength.  Therapist closely monitoring response and modifying as needed.     PT  Next Visit Plan  assess response to dry needling, glut medius strength, soft tissue, gastroc and hamstring flexiblity      Patient will benefit from skilled therapeutic intervention in order to improve the following deficits and impairments:     Visit Diagnosis: Muscle weakness (generalized)  Pain in right knee     Problem List Patient Active Problem List   Diagnosis Date Noted  . Diabetes (HCC) 01/08/2014  . Essential hypertension 01/08/2014  . Hyperlipidemia 01/08/2014  . Postoperative anemia due to acute blood loss 11/22/2012  . Hyponatremia 11/06/2012  . OA (osteoarthritis) of knee 11/05/2012  . 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, [address] [place], [place], [address] Phone: [phone]   Fax:  [phone]  Name: [name] MRN: [id number] Date of Birth: [birth year]

## 2015-06-29 ENCOUNTER — Ambulatory Visit: Payer: 59

## 2015-06-29 DIAGNOSIS — M25561 Pain in right knee: Secondary | ICD-10-CM | POA: Diagnosis not present

## 2015-06-29 DIAGNOSIS — M6281 Muscle weakness (generalized): Secondary | ICD-10-CM | POA: Diagnosis not present

## 2015-06-29 NOTE — Therapy (Signed)
[facility] [address], [address] [place], [place], [address] Phone: [phone]   Fax:  [phone]  Physical Therapy Treatment  Patient Details  Name: [name] MRN: [id number] Date of Birth: 1950-09-29 Referring Provider: Jodene NamStephen Chabon  Encounter Date: 06/29/2015      PT End of Session - 06/29/15 1610    Visit Number 6   Date for PT Re-Evaluation 08/04/15   PT Start Time 1520   PT Stop Time 1624   PT Time Calculation (min) 64 min   Activity Tolerance Patient tolerated treatment well   Behavior During Therapy [facility] for tasks assessed/performed      Past Medical History  Diagnosis Date  . Hypertension   . Arthritis   . History of kidney stones   . 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 Pinched nerve in neck   . Knee pain, right   . Swelling of knee joint, right   . Hyperlipidemia   . Hypothyroidism   . Diabetes mellitus ORAL MEDS  . Synovitis of knee RIGHT  . OSA on CPAP   . Osteoarthritis   . Depression   . GERD (gastroesophageal reflux disease)     occasional  . Pneumonia     hx of  . Anemia     hx of  . PONV (postoperative nausea and vomiting)     for 3-4 days after general anesthesia    Past Surgical History  Procedure Laterality Date  . Tonsillectomy and adenoidectomy  child  . Total knee arthroplasty  12-14-2009    RIGHT  . Pulley release left long finger  07-14-2009  . Left carpal tunnel / left middle & ring finger trigger release  08-26-2008  . Right carpal tunnel/ right thumb trigger release's  11-28-2006  . Shoulder arthroscopy w/ acromial repair  11-29-2005    LEFT  . Left shoulder arthroscopy/ left thumb trigger release  02-22-2005  . Shoulder arthroscopy distal clavicle excision and open rotator cuff repair  09-07-2004    LEFT  . Right shoulder arthroscopy w/ rotator cuff repair  01-13-2004  . Left shoulder arthroscopy w/ debridement  09-09-2003  . Cesarean section   X3  . Knee arthroscopy  04/20/2011    Procedure: ARTHROSCOPY KNEE;  Surgeon: Loanne DrillingFrank V Aluisio, MD;  [facility] [place];  Service: Orthopedics;  Laterality: Right;  WITH SYNOVECTOMY  . Total knee arthroplasty Left 11/05/2012    Procedure: LEFT TOTAL KNEE ARTHROPLASTY;  Surgeon: Loanne DrillingFrank V Aluisio, MD;  Location: [facility];  Service: Orthopedics;  Laterality: Left;  . Trigger finger release Right 02/21/2013    Procedure: RIGHT RING A-1 PULLEY RELEASE    (MINOR PROCEDURE) ;  Surgeon: Wyn Forsterobert V Sypher Jr., MD;  [facility];  Service: Orthopedics;  Laterality: Right;    There were no vitals filed for this visit.      Subjective Assessment - 06/29/15 1525    Subjective Pt reports that she felt good after last session.     Currently in Pain? Yes   Pain Onset More than a month ago   Pain Frequency Constant   Aggravating Factors  beindg knees, night time   Pain Relieving Factors rest, rubbing the knee, ice                         [facility] Adult PT Treatment/Exercise - 06/29/15 0001    Knee/Hip Exercises: Stretches   Active Hamstring  Stretch Right;3 reps;20 seconds  on stairs   Knee/Hip Exercises: Aerobic   Nustep Level 2 x 8 minutes  seat 6, arms 8   Knee/Hip Exercises: Sidelying   Clams 3 x 10   Modalities   Modalities Moist Heat   Moist Heat Therapy   Number Minutes Moist Heat 10 Minutes   Moist Heat Location Knee  right   Manual Therapy   Manual Therapy Soft tissue mobilization;Myofascial release   Manual therapy comments foam roll rectus soft tissue   Soft tissue mobilization VMO, VL soft tissue   McConnell fat pad release          Trigger Point Dry Needling - 06/29/15 1538    Consent Given? Yes   Muscles Treated Lower Body Quadriceps  VM, VL, Rectus distally   Quadriceps Response Twitch response elicited;Palpable increased muscle length                PT Short Term Goals - 06/29/15 1529    PT SHORT TERM GOAL #1    Title The patient will have basic knowledge of HEP to increase muscle length and initiate strengthening to correct muscle imbalances  07/07/15   Status Achieved   PT SHORT TERM GOAL #2   Title Pain improved by 25% with home and work ADLs   Time 4   Period Weeks   Status On-going   PT SHORT TERM GOAL #3   Title Patient will report a 25% improvement in night discomfort   Time 4   Period Weeks   Status On-going           PT Long Term Goals - 06/25/15 1712    PT LONG TERM GOAL #1   Title The patient will be independent in HEP and self management techniques   08/04/15   Time 8   Period Weeks   Status On-going   PT LONG TERM GOAL #2   Title The patient will have improved left hip abd strength to 4-/5 needed for standing/walking longer periods of time    Time 8   Period Weeks   Status On-going   PT LONG TERM GOAL #3   Title The patient will have improved quad strength to 4+/5 needed for descending steps with greater ease   Time 8   Period Weeks   Status On-going   PT LONG TERM GOAL #4   Title The patient will report a 50% improvement in pain with home and work tasks    Time 8   Period Weeks   Status On-going   PT LONG TERM GOAL #5   Title FOTO functional outcome score improved from 50% to 44% indicating improved function with less pain   Time 8   Period Weeks   Status On-going               Plan - 06/29/15 1530    Clinical Impression Statement Pt reports that her Rt knee feels 10% better since the start of care with ADLs and work tasks.  Pt with multiple trigger points in Rt quads and has decresaed Rt gluteal strength. Pt with reduced sensitiivty to soft tissue work over medial Rt thigh after needling.   Pt will continued to benefit from skilled PT for strength, endurance, manual and 1-2 more sessions of dry needling.     Rehab Potential Good   PT Frequency 2x / week   PT Duration 8 weeks   PT Treatment/Interventions Cryotherapy;Electrical Stimulation;Moist  Heat;Iontophoresis /ml Dexamethasone;Therapeutic exercise;Neuromuscular re-education;Patient/family education;Manual  techniques;Taping;Dry needling   PT Next Visit Plan assess response to dry needling, glut medius strength, soft tissue, gastroc and hamstring flexiblity   Consulted and Agree with Plan of Care Patient      Patient will benefit from skilled therapeutic intervention in order to improve the following deficits and impairments:  Pain, Decreased strength, Difficulty walking, Increased muscle spasms, Impaired flexibility  Visit Diagnosis: Muscle weakness (generalized)  Pain in right knee     Problem List Patient Active Problem List   Diagnosis Date Noted  . Diabetes (HCC) 01/08/2014  . Essential hypertension 01/08/2014  . Hyperlipidemia 01/08/2014  . Postoperative anemia due to acute blood loss 11/22/2012  . Hyponatremia 11/06/2012  . OA (osteoarthritis) of knee 11/05/2012  . Villonodular synovitis of knee 04/20/2011    [name], PT 06/29/2015 4:12 PM  [facility] [address], [address] [place], [place], [address] Phone: [phone]   Fax:  [phone]  Name: [name] MRN: [id number] Date of Birth: 1950-07-06

## 2015-07-02 ENCOUNTER — Ambulatory Visit: Payer: 59 | Admitting: Physical Therapy

## 2015-07-02 DIAGNOSIS — M6281 Muscle weakness (generalized): Secondary | ICD-10-CM | POA: Diagnosis not present

## 2015-07-02 DIAGNOSIS — M25561 Pain in right knee: Secondary | ICD-10-CM | POA: Diagnosis not present

## 2015-07-02 NOTE — Therapy (Signed)
[facility] [address], [address] [place], [place], [address] Phone: [phone]   Fax:  [phone]  Physical Therapy Treatment  Patient Details  Name: [name] MRN: [id number] Date of Birth: 1951/01/14 Referring Provider: Jodene Nam  Encounter Date: 07/02/2015      PT End of Session - 07/02/15 1834    Visit Number 7   Number of Visits 10   Date for PT Re-Evaluation 08/04/15   Authorization Type [facility] but of Medicare age (will apply G codes)   PT Start Time 1528/07/26   PT Stop Time 1625   PT Time Calculation (min) 55 min   Activity Tolerance Patient tolerated treatment well      Past Medical History  Diagnosis Date  . Hypertension   . Arthritis   . History of kidney stones   . 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 Pinched nerve in neck   . Knee pain, right   . Swelling of knee joint, right   . Hyperlipidemia   . Hypothyroidism   . Diabetes mellitus [facility]AL [facility]  . Synovitis of knee RIGHT  . OSA on CPAP   . Osteoarthritis   . Depression   . GERD (gastroesophageal reflux disease)     occasional  . Pneumonia     hx of  . Anemia     hx of  . PONV (postoperative nausea and vomiting)     for 3-4 days after general anesthesia    Past Surgical History  Procedure Laterality Date  . Tonsillectomy and adenoidectomy  child  . Total knee arthroplasty  12-14-2009    RIGHT  . Pulley release left long finger  07-14-2009  . Left carpal tunnel / left middle & ring finger trigger release  08-26-2008  . Right carpal tunnel/ right thumb trigger release's  11-28-2006  . Shoulder arthroscopy w/ acromial repair  11-29-2005    LEFT  . Left shoulder arthroscopy/ left thumb trigger release  02-22-2005  . Shoulder arthroscopy distal clavicle excision and open rotator cuff repair  09-07-2004    LEFT  . Right shoulder arthroscopy w/ rotator cuff repair  01-13-2004  . Left shoulder arthroscopy w/ debridement   09-09-2003  . Cesarean section  X3  . Knee arthroscopy  04/20/2011    Procedure: ARTHROSCOPY KNEE;  Surgeon: Loanne Drilling, MD;  [facility]: [facility];  Service: Orthopedics;  Laterality: Right;  WITH SYNOVECTOMY  . Total knee arthroplasty Left 11/05/2012    Procedure: LEFT TOTAL KNEE ARTHROPLASTY;  Surgeon: Loanne Drilling, MD;  Location: [facility];  Service: Orthopedics;  Laterality: Left;  . Trigger finger release Right 02/21/2013    Procedure: RIGHT RING A-1 PULLEY RELEASE    (MINOR PROCEDURE) ;  Surgeon: Wyn Forster., MD;  Location: [facility];  Service: Orthopedics;  Laterality: Right;    There were no vitals filed for this visit.      Subjective Assessment - 07/02/15 1542    Subjective It was good,  I think the taping helped.  Some lateral knee pain yesterday and swelling.  Still hurts in the front.  Denies soreness from needling last visit.  Did my usual swimming at the gym.     Currently in Pain? Yes   Pain Score 3    Pain Location Knee   Pain Orientation Right   Pain Type Chronic pain   Pain Frequency Constant  [facility] Adult PT Treatment/Exercise - 07/02/15 0001    Knee/Hip Exercises: Aerobic   Nustep Level 2 x 8 minutes  seat 6, arms 8   Knee/Hip Exercises: Standing   Gait Training Trendelenberg glut ex 10x R/L   Other Standing Knee Exercises psoas door way stretches with UE reaches 15x R/L   Other Standing Knee Exercises UE wall slides with glut squeeze and wall climbs 10x each   Moist Heat Therapy   Number Minutes Moist Heat 10 Minutes   Moist Heat Location Knee   Manual Therapy   Manual therapy comments foam roll rectus soft tissue   Soft tissue mobilization VMO, VL soft tissue   McConnell fat pad release          Trigger Point Dry Needling - 07/02/15 1833    Consent Given? Yes   Muscles Treated Lower Body Hamstring;Tensor fascia lata;Quadriceps   Tensor Fascia Lata Response Palpable  increased muscle length   Quadriceps Response Twitch response elicited;Palpable increased muscle length   Hamstring Response Palpable increased muscle length      Right only.            PT Short Term Goals - 07/02/15 1838    PT SHORT TERM GOAL #1   Title The patient will have basic knowledge of HEP to increase muscle length and initiate strengthening to correct muscle imbalances  07/07/15   Status Achieved   PT SHORT TERM GOAL #2   Title Pain improved by 25% with home and work ADLs   Time 4   Period Weeks   Status On-going   PT SHORT TERM GOAL #3   Title Patient will report a 25% improvement in night discomfort   Time 4   Period Weeks   Status On-going           PT Long Term Goals - 07/02/15 1838    PT LONG TERM GOAL #1   Title The patient will be independent in HEP and self management techniques   08/04/15   Time 8   Period Weeks   Status On-going   PT LONG TERM GOAL #2   Title The patient will have improved left hip abd strength to 4-/5 needed for standing/walking longer periods of time    Time 8   Period Weeks   Status On-going   PT LONG TERM GOAL #3   Title The patient will have improved quad strength to 4+/5 needed for descending steps with greater ease   Time 8   Period Weeks   Status On-going   PT LONG TERM GOAL #4   Title The patient will report a 50% improvement in pain with home and work tasks    Time 8   Period Weeks   Status On-going   PT LONG TERM GOAL #5   Title FOTO functional outcome score improved from 50% to 44% indicating improved function with less pain   Time 8   Period Weeks   Status On-going               Plan - 07/02/15 1834    Clinical Impression Statement Decreased quad muscle tender points.  She reports decreased pain with fat pad release taping.  She is able to perform exercise to increase quad and psoas muscle lengths without pain exacerbation.  Check progress with STGS.  Therapist closely monitoring response throughout  treatment session.     PT Next Visit Plan assess response to dry needling #4;  glut medius strength, soft tissue,  gastroc and hamstring flexiblity;  check STGs;  fat pad taping      Patient will benefit from skilled therapeutic intervention in order to improve the following deficits and impairments:     Visit Diagnosis: Muscle weakness (generalized)  Pain in right knee     Problem List Patient Active Problem List   Diagnosis Date Noted  . Diabetes (HCC) 01/08/2014  . Essential hypertension 01/08/2014  . Hyperlipidemia 01/08/2014  . Postoperative anemia due to acute blood loss 11/22/2012  . Hyponatremia 11/06/2012  . OA (osteoarthritis) of knee 11/05/2012  . Villonodular synovitis of knee 04/20/2011     Lavinia SharpsStacy Simpson, PT 07/02/2015 6:40 PM Phone: [phone] Fax: [phone]   Vivien PrestoSimpson, Stacy C 07/02/2015, 6:39 PM  [facility] [address], [address] [place], [place], [address] Phone: [phone]   Fax:  [phone]  Name: [name] MRN: [id number] Date of Birth: May 24, 1950

## 2015-07-06 ENCOUNTER — Encounter: Payer: 59 | Admitting: Physical Therapy

## 2015-07-06 MED FILL — LEVOTHYROXINE 100 MCG TAB: 100 | 90 days supply | Qty: 90 | Fill #1

## 2015-07-06 MED FILL — OMEPRAZOLE DR 20 MG CAPSULE: 20 | 30 days supply | Qty: 30 | Fill #3

## 2015-07-06 MED FILL — CONTOUR NEXT STRIPS: 90 days supply | Qty: 300 | Fill #0

## 2015-07-06 MED FILL — DULoxetine HCL 60 MG CPEP: 60 | 30 days supply | Qty: 30 | Fill #9

## 2015-07-06 MED FILL — VICTOZA 18 MG/3 ML INJECT P: 18 | 30 days supply | Qty: 9 | Fill #2

## 2015-07-07 ENCOUNTER — Ambulatory Visit: Payer: 59

## 2015-07-07 DIAGNOSIS — M6281 Muscle weakness (generalized): Secondary | ICD-10-CM

## 2015-07-07 DIAGNOSIS — M25561 Pain in right knee: Secondary | ICD-10-CM

## 2015-07-07 MED FILL — AMOXICILLIN 500 MG CAPSULE: 500 | 5 days supply | Qty: 20 | Fill #0

## 2015-07-07 NOTE — Therapy (Signed)
[facility] [address], [address] [place], [place], [address] Phone: [phone]   Fax:  [phone]  Physical Therapy Treatment  Patient Details  Name: [name] MRN: [id number] Date of Birth: 10-09-1950 Referring Provider: Jodene Nam  Encounter Date: 07/07/2015      PT End of Session - 07/07/15 1617    Visit Number 8   Number of Visits 10   Date for PT Re-Evaluation 08/04/15   Authorization Type [facility] U[facility] but of Medicare age (will apply G codes)   PT Start Time 08-13-30   PT Stop Time 08-13-1615   PT Time Calculation (min) 45 min   Activity Tolerance Patient tolerated treatment well   Behavior During Therapy [facility] for tasks assessed/performed      Past Medical History  Diagnosis Date  . Hypertension   . Arthritis   . History of kidney stones   . 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 Pinched nerve in neck   . Knee pain, right   . Swelling of knee joint, right   . Hyperlipidemia   . Hypothyroidism   . Diabetes mellitus ORAL MEDS  . Synovitis of knee RIGHT  . OSA on CPAP   . Osteoarthritis   . Depression   . GERD (gastroesophageal reflux disease)     occasional  . Pneumonia     hx of  . Anemia     hx of  . PONV (postoperative nausea and vomiting)     for 3-4 days after general anesthesia    Past Surgical History  Procedure Laterality Date  . Tonsillectomy and adenoidectomy  child  . Total knee arthroplasty  12-14-2009    RIGHT  . Pulley release left long finger  07-14-2009  . Left carpal tunnel / left middle & ring finger trigger release  08-26-2008  . Right carpal tunnel/ right thumb trigger release's  11-28-2006  . Shoulder arthroscopy w/ acromial repair  11-29-2005    LEFT  . Left shoulder arthroscopy/ left thumb trigger release  02-22-2005  . Shoulder arthroscopy distal clavicle excision and open rotator cuff repair  09-07-2004    LEFT  . Right shoulder arthroscopy w/ rotator cuff repair   01-13-2004  . Left shoulder arthroscopy w/ debridement  09-09-2003  . Cesarean section  X3  . Knee arthroscopy  04/20/2011    Procedure: ARTHROSCOPY KNEE;  Surgeon: Loanne Drilling, MD;  Location: [facility];  Service: Orthopedics;  Laterality: Right;  WITH SYNOVECTOMY  . Total knee arthroplasty Left 11/05/2012    Procedure: LEFT TOTAL KNEE ARTHROPLASTY;  Surgeon: Loanne Drilling, MD;  Location: [facility];  Service: Orthopedics;  Laterality: Left;  . Trigger finger release Right 02/21/2013    Procedure: RIGHT RING A-1 PULLEY RELEASE    (MINOR PROCEDURE) ;  Surgeon: Wyn Forster., MD;  Location: [facility];  Service: Orthopedics;  Laterality: Right;    There were no vitals filed for this visit.      Subjective Assessment - 07/07/15 1537    Subjective The tape felt good last session.  Left it on for 2 a days.  Pt    Patient Stated Goals I don't want to have surgery but lessen the pain;     Currently in Pain? Yes   Pain Score 3    Pain Location Knee   Pain Orientation Right   Pain Descriptors / Indicators Aching   Pain Type Chronic pain  Pain Onset More than a month ago   Pain Frequency Constant   Aggravating Factors  bending knees, night time   Pain Relieving Factors rest, rubbing the leg, ice                         [facility] Adult PT Treatment/Exercise - 07/07/15 0001    Knee/Hip Exercises: Stretches   Active Hamstring Stretch Right;3 reps;20 seconds  on stairs   Hip Flexor Stretch Right;3 reps;20 seconds   Knee/Hip Exercises: Aerobic   Nustep Level 2 x 8 minutes  seat 6, arms 8   Knee/Hip Exercises: Standing   Rocker Board 3 minutes   Rebounder 3 ways x 1 minute each   Other Standing Knee Exercises UE wall slides with glut squeeze and wall climbs 10x each   Moist Heat Therapy   Number Minutes Moist Heat 10 Minutes   Moist Heat Location Knee   Manual Therapy   Manual Therapy Soft tissue mobilization;Myofascial release   Soft  tissue mobilization VMO, VL soft tissue  use of orange roller   McConnell fat pad release                  PT Short Term Goals - 07/07/15 1544    PT SHORT TERM GOAL #2   Title Pain improved by 25% with home and work ADLs   Status Achieved   PT SHORT TERM GOAL #3   Title Patient will report a 25% improvement in night discomfort   Status Achieved           PT Long Term Goals - 07/02/15 1838    PT LONG TERM GOAL #1   Title The patient will be independent in HEP and self management techniques   08/04/15   Time 8   Period Weeks   Status On-going   PT LONG TERM GOAL #2   Title The patient will have improved left hip abd strength to 4-/5 needed for standing/walking longer periods of time    Time 8   Period Weeks   Status On-going   PT LONG TERM GOAL #3   Title The patient will have improved quad strength to 4+/5 needed for descending steps with greater ease   Time 8   Period Weeks   Status On-going   PT LONG TERM GOAL #4   Title The patient will report a 50% improvement in pain with home and work tasks    Time 8   Period Weeks   Status On-going   PT LONG TERM GOAL #5   Title FOTO functional outcome score improved from 50% to 44% indicating improved function with less pain   Time 8   Period Weeks   Status On-going               Plan - 07/07/15 1545    Clinical Impression Statement Pt reports 30% overall improvement in symptoms since the start of care. Pt reports that she is having less Rt LE pain with standing and walking and at night.  Pt with significant decresaed tension and trigger points in the Rt quads.  Pt will continue to benefit from skilled PT for flexiblity, manual therapy, strength and taping to reduced knee pain.     Rehab Potential Good   PT Frequency 2x / week   PT Duration 8 weeks   PT Treatment/Interventions Cryotherapy;Electrical Stimulation;Moist Heat;Iontophoresis 4mg /ml Dexamethasone;Therapeutic exercise;Neuromuscular  re-education;Patient/family education;Manual techniques;Taping;Dry needling   PT Next Visit Plan assess  response to dry needling #4;  glut medius strength, soft tissue, gastroc and hamstring flexiblity; fat pad taping      Patient will benefit from skilled therapeutic intervention in order to improve the following deficits and impairments:  Pain, Decreased strength, Difficulty walking, Increased muscle spasms, Impaired flexibility  Visit Diagnosis: Muscle weakness (generalized)  Pain in right knee     Problem List Patient Active Problem List   Diagnosis Date Noted  . Diabetes (HCC) 01/08/2014  . Essential hypertension 01/08/2014  . Hyperlipidemia 01/08/2014  . Postoperative anemia due to acute blood loss 11/22/2012  . Hyponatremia 11/06/2012  . OA (osteoarthritis) of knee 11/05/2012  . Villonodular synovitis of knee 04/20/2011    Lorrene Reid, PT 07/07/2015 4:19 PM  [facility] [address] [place], [place], [address] Phone: [phone]   Fax:  [phone]  Name: [name] MRN: [id number] Date of Birth: [birth year]

## 2015-07-09 ENCOUNTER — Ambulatory Visit: Payer: 59

## 2015-07-09 DIAGNOSIS — M25561 Pain in right knee: Secondary | ICD-10-CM | POA: Diagnosis not present

## 2015-07-09 DIAGNOSIS — M6281 Muscle weakness (generalized): Secondary | ICD-10-CM | POA: Diagnosis not present

## 2015-07-09 NOTE — Therapy (Signed)
[facility] [address], [address] [place], [place], [address] Phone: [phone]   Fax:  [phone]  Physical Therapy Treatment  Patient Details  Name: [name] MRN: [id number] Date of Birth: 02/11/1951 Referring Provider: Jodene NamStephen Chabon  Encounter Date: 07/09/2015      PT End of Session - 07/09/15 1613    Visit Number 9   Number of Visits 10   Date for PT Re-Evaluation 08/04/15   Authorization Type [facility] UMR but of Medicare age (will apply G codes)   PT Start Time 1531   PT Stop Time 1632   PT Time Calculation (min) 61 min   Activity Tolerance Patient tolerated treatment well   Behavior [facility] for tasks assessed/performed      Past Medical History  Diagnosis Date  . Hypertension   . Arthritis   . History of kidney stones   . 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 Pinched nerve in neck   . Knee pain, right   . Swelling of knee joint, right   . Hyperlipidemia   . Hypothyroidism   . Diabetes mellitus ORAL MEDS  . Synovitis of knee RIGHT  . OSA on CPAP   . Osteoarthritis   . Depression   . GERD (gastroesophageal reflux disease)     occasional  . Pneumonia     hx of  . Anemia     hx of  . PONV (postoperative nausea and vomiting)     for 3-4 days after general anesthesia    Past Surgical History  Procedure Laterality Date  . Tonsillectomy and adenoidectomy  child  . Total knee arthroplasty  12-14-2009    RIGHT  . Pulley release left long finger  07-14-2009  . Left carpal tunnel / left middle & ring finger trigger release  08-26-2008  . Right carpal tunnel/ right thumb trigger release's  11-28-2006  . Shoulder arthroscopy w/ acromial repair  11-29-2005    LEFT  . Left shoulder arthroscopy/ left thumb trigger release  02-22-2005  . Shoulder arthroscopy distal clavicle excision and open rotator cuff repair  09-07-2004    LEFT  . Right shoulder arthroscopy w/ rotator cuff repair   01-13-2004  . Left shoulder arthroscopy w/ debridement  09-09-2003  . Cesarean section  X3  . Knee arthroscopy  04/20/2011    Procedure: ARTHROSCOPY KNEE;  Surgeon: Loanne DrillingFrank V Aluisio, MD;  [facility];  Service: Orthopedics;  Laterality: Right;  WITH SYNOVECTOMY  . Total knee arthroplasty Left 11/05/2012    Procedure: LEFT TOTAL KNEE ARTHROPLASTY;  Surgeon: Loanne DrillingFrank V Aluisio, MD;  Location: [facility];  Service: Orthopedics;  Laterality: Left;  . Trigger finger release Right 02/21/2013    Procedure: RIGHT RING A-1 PULLEY RELEASE    (MINOR PROCEDURE) ;  Surgeon: Wyn Forsterobert V Sypher Jr., MD;  [facility] [place];  Service: Orthopedics;  Laterality: Right;    There were no vitals filed for this visit.      Subjective Assessment - 07/09/15 1537    Subjective Tape is really helping.     Currently in Pain? Yes   Pain Score 5   when riding NuStep   Pain Orientation Right   Pain Descriptors / Indicators Aching   Pain Type Chronic pain   Pain Onset More than a month ago   Pain Frequency Constant   Aggravating Factors  bending knees, night time   Pain Relieving Factors rest, rubbing the  let, ice                         [facility] Adult PT Treatment/Exercise - 07/09/15 0001    Knee/Hip Exercises: Stretches   Active Hamstring Stretch Right;3 reps;20 seconds  on stairs   Hip Flexor Stretch Right;3 reps;20 seconds   Other Knee/Hip Stretches Butterfly stretch x 3 with 20   Knee/Hip Exercises: Aerobic   Nustep Level 2 x 10 minutes  seat 6, arms 8   Knee/Hip Exercises: Standing   Rocker Board 3 minutes   Rebounder 3 ways x 1 minute each   Other Standing Knee Exercises UE wall slides with glut squeeze and wall climbs 10x each   Moist Heat Therapy   Number Minutes Moist Heat 10 Minutes   Moist Heat Location Knee   Manual Therapy   Manual Therapy Soft tissue mobilization;Myofascial release   Soft tissue mobilization VMO, VL soft tissue  use of orange  roller   McConnell fat pad release                  PT Short Term Goals - 07/07/15 1544    PT SHORT TERM GOAL #2   Title Pain improved by 25% with home and work ADLs   Status Achieved   PT SHORT TERM GOAL #3   Title Patient will report a 25% improvement in night discomfort   Status Achieved           PT Long Term Goals - 07/02/15 1838    PT LONG TERM GOAL #1   Title The patient will be independent in HEP and self management techniques   08/04/15   Time 8   Period Weeks   Status On-going   PT LONG TERM GOAL #2   Title The patient will have improved left hip abd strength to 4-/5 needed for standing/walking longer periods of time    Time 8   Period Weeks   Status On-going   PT LONG TERM GOAL #3   Title The patient will have improved quad strength to 4+/5 needed for descending steps with greater ease   Time 8   Period Weeks   Status On-going   PT LONG TERM GOAL #4   Title The patient will report a 50% improvement in pain with home and work tasks    Time 8   Period Weeks   Status On-going   PT LONG TERM GOAL #5   Title FOTO functional outcome score improved from 50% to 44% indicating improved function with less pain   Time 8   Period Weeks   Status On-going               Plan - 07/09/15 1540    Clinical Impression Statement Pt reports 30% overall improvement in symptoms since the start of care.  Pt reports that Rt knee is less painful with standing and waking and at night.  Pt with significant decreased tension and tenderness/trigger points in the Rt quads and adductors.  Pt will continue to benefit from skilled PT for flexibility, manual therapy, strength, and taping to reduce knee pain.     Rehab Potential Good   PT Frequency 2x / week   PT Duration 8 weeks   PT Treatment/Interventions Cryotherapy;Electrical Stimulation;Moist Heat;Iontophoresis /ml Dexamethasone;Therapeutic exercise;Neuromuscular re-education;Patient/family education;Manual  techniques;Taping;Dry needling   PT Next Visit Plan 1 more session of DN if needed,  glut medius strength, soft tissue, gastroc and hamstring flexiblity; fat  pad taping   Consulted and Agree with Plan of Care Patient      Patient will benefit from skilled therapeutic intervention in order to improve the following deficits and impairments:  Pain, Decreased strength, Difficulty walking, Increased muscle spasms, Impaired flexibility  Visit Diagnosis: Muscle weakness (generalized)  Pain in right knee     Problem List Patient Active Problem List   Diagnosis Date Noted  . Diabetes (HCC) 01/08/2014  . Essential hypertension 01/08/2014  . Hyperlipidemia 01/08/2014  . Postoperative anemia due to acute blood loss 11/22/2012  . Hyponatremia 11/06/2012  . OA (osteoarthritis) of knee 11/05/2012  . Villonodular synovitis of knee 04/20/2011    Lorrene Reid, PT 07/09/2015 4:15 PM  [facility] [address] [place], [place], [address] Phone: [phone]   Fax:  [phone]  Name: [name] MRN: [id number] Date of Birth: [birth year]

## 2015-07-13 ENCOUNTER — Ambulatory Visit: Payer: 59 | Attending: Orthopedic Surgery

## 2015-07-13 DIAGNOSIS — M25561 Pain in right knee: Secondary | ICD-10-CM | POA: Insufficient documentation

## 2015-07-13 DIAGNOSIS — M6281 Muscle weakness (generalized): Secondary | ICD-10-CM | POA: Diagnosis not present

## 2015-07-13 NOTE — Therapy (Signed)
[facility] [address] [place], [place], [address] Phone: [phone]   Fax:  [phone]  Physical Therapy Treatment  Patient Details  Name: [name] MRN: [id number] Date of Birth: 04-Sep-1950 Referring Provider: Jodene Nam  Encounter Date: 11-Aug-2015      PT End of Session - 2015-08-11 1553    Visit Number 10   Number of Visits 20   Date for PT Re-Evaluation 08/04/15   Authorization Type [facility] UMR but of Medicare age (will apply G codes)   PT Start Time Aug 11, 1530   PT Stop Time 1611-08-11   PT Time Calculation (min) 41 min   Activity Tolerance Patient tolerated treatment well   Behavior During Therapy [facility] for tasks assessed/performed      Past Medical History  Diagnosis Date  . Hypertension   . Arthritis   . History of kidney stones   . 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 Pinched nerve in neck   . Knee pain, right   . Swelling of knee joint, right   . Hyperlipidemia   . Hypothyroidism   . Diabetes mellitus ORAL MEDS  . Synovitis of knee RIGHT  . OSA on CPAP   . Osteoarthritis   . Depression   . GERD (gastroesophageal reflux disease)     occasional  . Pneumonia     hx of  . Anemia     hx of  . PONV (postoperative nausea and vomiting)     for 3-4 days after general anesthesia    Past Surgical History  Procedure Laterality Date  . Tonsillectomy and adenoidectomy  child  . Total knee arthroplasty  12-14-2009    RIGHT  . Pulley release left long finger  07-14-2009  . Left carpal tunnel / left middle & ring finger trigger release  08-26-2008  . Right carpal tunnel/ right thumb trigger release's  11-28-2006  . Shoulder arthroscopy w/ acromial repair  11-29-2005    LEFT  . Left shoulder arthroscopy/ left thumb trigger release  02-22-2005  . Shoulder arthroscopy distal clavicle excision and open rotator cuff repair  09-07-2004    LEFT  . Right shoulder arthroscopy w/ rotator cuff repair   01-13-2004  . Left shoulder arthroscopy w/ debridement  09-09-2003  . Cesarean section  X3  . Knee arthroscopy  04/20/2011    Procedure: ARTHROSCOPY KNEE;  Surgeon: Loanne Drilling, MD;  Location: [facility];  Service: Orthopedics;  Laterality: Right;  WITH SYNOVECTOMY  . Total knee arthroplasty Left 11/05/2012    Procedure: LEFT TOTAL KNEE ARTHROPLASTY;  Surgeon: Loanne Drilling, MD;  Location: [facility];  Service: Orthopedics;  Laterality: Left;  . Trigger finger release Right 02/21/2013    Procedure: RIGHT RING A-1 PULLEY RELEASE    (MINOR PROCEDURE) ;  Surgeon: Wyn Forster., MD;  Location: [facility];  Service: Orthopedics;  Laterality: Right;    There were no vitals filed for this visit.      Subjective Assessment - 08/11/15 1539    Subjective Feeling better.  Rt knee is feeling better.     Pertinent History Diabetes, HTN ;  10-Aug-2009 right knee TKR, 08/11/11 left TKR;  hx of left hip bursitis   Patient Stated Goals I don't want to have surgery but lessen the pain;     Currently in Pain? Yes   Pain Score 2    Pain Location Knee   Pain Orientation Right  Pain Descriptors / Indicators Aching   Pain Type Chronic pain   Pain Onset More than a month ago   Pain Frequency Constant   Aggravating Factors  bending knees, night time   Pain Relieving Factors rest, rubbing the leg, ice, tape            [facility] PT Assessment - 07/13/15 0001    Observation/Other Assessments   Focus on Therapeutic Outcomes (FOTO)  44% limitation                     [facility] Adult PT Treatment/Exercise - 07/13/15 0001    Knee/Hip Exercises: Stretches   Active Hamstring Stretch Right;3 reps;20 seconds  on stairs   Hip Flexor Stretch Right;3 reps;20 seconds   Other Knee/Hip Stretches Butterfly stretch x 3 with 20   Knee/Hip Exercises: Aerobic   Stationary Bike L 2  x 10 min   Knee/Hip Exercises: Standing   Forward Step Up Both;2 sets;10 reps;Hand Hold: 1;Step  Height: 6"   Rocker Board 3 minutes   Rebounder 3 ways x 1 minute each   Other Standing Knee Exercises UE wall slides with glut squeeze and wall climbs 10x each   Moist Heat Therapy   Number Minutes Moist Heat 10 Minutes   Moist Heat Location Knee   Manual Therapy   Manual Therapy Soft tissue mobilization;Myofascial release   Soft tissue mobilization VMO, VL soft tissue  use of orange roller   McConnell fat pad release                  PT Short Term Goals - 07/07/15 1544    PT SHORT TERM GOAL #2   Title Pain improved by 25% with home and work ADLs   Status Achieved   PT SHORT TERM GOAL #3   Title Patient will report a 25% improvement in night discomfort   Status Achieved           PT Long Term Goals - 07/13/15 1543    PT LONG TERM GOAL #1   Title The patient will be independent in HEP and self management techniques   08/04/15   Time 8   Period Weeks   Status On-going   PT LONG TERM GOAL #4   Title The patient will report a 50% improvement in pain with home and work tasks    Time 8   Period Weeks   Status On-going   PT LONG TERM GOAL #5   Title FOTO functional outcome score improved from 50% to 44% indicating improved function with less pain   Status Achieved               Plan - 07/13/15 1545    Clinical Impression Statement Pt reports 30% overall improvement in symtoms since the start of care.  FOTO score has improved to 44% limitation today.  Pt reports that Rt knee is less painful with standing and walking.  Pt with significant decreased tension and tenderness/trigger points in the Rt quads and adductors.  Pt will benefit from skilled PT for flexibility, manual therapy, strength and taping to reduce knee pain.     Rehab Potential Good   PT Frequency 2x / week   PT Duration 8 weeks   PT Treatment/Interventions Cryotherapy;Electrical Stimulation;Moist Heat;Iontophoresis 4mg /ml Dexamethasone;Therapeutic exercise;Neuromuscular  re-education;Patient/family education;Manual techniques;Taping;Dry needling   PT Next Visit Plan 1 more session of DN if needed,  glut medius strength, soft tissue, gastroc and hamstring flexiblity; fat pad taping.  Take strength measurements and route note to MD (appt 07/16/15)   Consulted and Agree with Plan of Care Patient      Patient will benefit from skilled therapeutic intervention in order to improve the following deficits and impairments:  Pain, Decreased strength, Difficulty walking, Increased muscle spasms, Impaired flexibility  Visit Diagnosis: Pain in right knee  Muscle weakness (generalized)       G-Codes - 07/16/15 1550    Functional Assessment Tool Used FOTO: 44% limitation   Functional Limitation Mobility: Walking and moving around   Mobility: Walking and Moving Around Current Status [phone]) At least 40 percent but less than 60 percent impaired, limited or restricted   Mobility: Walking and Moving Around Goal Status [phone]) At least 40 percent but less than 60 percent impaired, limited or restricted      Problem List Patient Active Problem List   Diagnosis Date Noted  . Diabetes (HCC) 01/08/2014  . Essential hypertension 01/08/2014  . Hyperlipidemia 01/08/2014  . Postoperative anemia due to acute blood loss 11/22/2012  . Hyponatremia 11/06/2012  . OA (osteoarthritis) of knee 11/05/2012  . Villonodular synovitis of knee 04/20/2011    Lorrene Reid, PT 07/16/2015 4:12 PM  [facility] [address] [place], [place], [address] Phone: [phone]   Fax:  [phone]  Name: [name] MRN: [id number] Date of Birth: 1950-08-01

## 2015-07-15 ENCOUNTER — Encounter: Payer: Self-pay | Admitting: Physical Therapy

## 2015-07-15 ENCOUNTER — Ambulatory Visit: Payer: 59 | Admitting: Physical Therapy

## 2015-07-15 DIAGNOSIS — M25561 Pain in right knee: Secondary | ICD-10-CM

## 2015-07-15 DIAGNOSIS — M6281 Muscle weakness (generalized): Secondary | ICD-10-CM | POA: Diagnosis not present

## 2015-07-15 MED FILL — LANTUS 100 UNITS/ML VIAL: 100 | 90 days supply | Qty: 90 | Fill #3

## 2015-07-15 MED FILL — METFORMIN HCL ER 500 MG TAB: 500 | 90 days supply | Qty: 180 | Fill #2

## 2015-07-15 NOTE — Therapy (Signed)
[facility] [address] [place], [place], [address] Phone: [phone]   Fax:  [phone]  Physical Therapy Treatment  Patient Details  Name: [name] MRN: [id number] Date of Birth: 03-17-1950 Referring Provider: Gerrit Halls  Encounter Date: 07/15/2015      PT End of Session - 07/15/15 1602    Visit Number 11   Number of Visits 20   Date for PT Re-Evaluation 08/04/15   Authorization Type [facility] UMR but of Medicare age (will apply G codes)   PT Start Time 05-15-1531   PT Stop Time 1631   PT Time Calculation (min) 58 min   Activity Tolerance Patient tolerated treatment well   Behavior During Therapy [facility] for tasks assessed/performed      Past Medical History  Diagnosis Date  . Hypertension   . Arthritis   . History of kidney stones   . 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 Pinched nerve in neck   . Knee pain, right   . Swelling of knee joint, right   . Hyperlipidemia   . Hypothyroidism   . Diabetes mellitus ORAL MEDS  . Synovitis of knee RIGHT  . OSA on CPAP   . Osteoarthritis   . Depression   . GERD (gastroesophageal reflux disease)     occasional  . Pneumonia     hx of  . Anemia     hx of  . PONV (postoperative nausea and vomiting)     for 3-4 days after general anesthesia    Past Surgical History  Procedure Laterality Date  . Tonsillectomy and adenoidectomy  child  . Total knee arthroplasty  12-14-2009    RIGHT  . Pulley release left long finger  07-14-2009  . Left carpal tunnel / left middle & ring finger trigger release  08-26-2008  . Right carpal tunnel/ right thumb trigger release's  11-28-2006  . Shoulder arthroscopy w/ acromial repair  11-29-2005    LEFT  . Left shoulder arthroscopy/ left thumb trigger release  02-22-2005  . Shoulder arthroscopy distal clavicle excision and open rotator cuff repair  09-07-2004    LEFT  . Right shoulder arthroscopy w/ rotator cuff repair   01-13-2004  . Left shoulder arthroscopy w/ debridement  09-09-2003  . Cesarean section  X3  . Knee arthroscopy  04/20/2011    Procedure: ARTHROSCOPY KNEE;  Surgeon: Gearlean Alf, MD;  Location[facility];  Service: Orthopedics;  Laterality: Right;  WITH SYNOVECTOMY  . Total knee arthroplasty Left 11/05/2012    Procedure: LEFT TOTAL KNEE ARTHROPLASTY;  Surgeon: Gearlean Alf, MD;  Location[facility];  Service: Orthopedics;  Laterality: Left;  . Trigger finger release Right 02/21/2013    Procedure: RIGHT RING A-1 PULLEY RELEASE    (MINOR PROCEDURE) ;  Surgeon: Cammie Sickle., MD;  Location: [facility];  Service: Orthopedics;  Laterality: Right;    There were no vitals filed for this visit.      Subjective Assessment - 07/15/15 1558    Subjective right knee is feeling 30% better since start of PT. Pt rates her pain today as 4/10 and desending stairs is most difficult.    Pertinent History Diabetes, HTN ;  2009/05/14 right knee TKR, 2011/05/15 left TKR;  hx of left hip bursitis   Limitations House hold activities;Walking;Standing   How long can you walk comfortably? slower getting moving    Diagnostic tests bone scans and x-rays  Patient Stated Goals I don't want to have surgery but lessen the pain;     Currently in Pain? Yes   Pain Score 4    Pain Location Knee   Pain Orientation Right   Pain Descriptors / Indicators Aching   Pain Type Chronic pain   Pain Onset More than a month ago   Pain Frequency Constant   Aggravating Factors  bending knees, desending stairs, night time   Pain Relieving Factors rest, rubbing, stretching, ice, tape            [facility] PT Assessment - 07/15/15 0001    Assessment   Medical Diagnosis right knee replacement    Referring Provider Gerrit Halls   Onset Date/Surgical Date --  18month  Hand Dominance Right   Next MD Visit Dr. ABinnie Rail5/5/17   Prior Therapy immediate post op PT    Precautions   Precautions None    Restrictions   Weight Bearing Restrictions No   Balance Screen   Has the patient fallen in the past 6 months No   Has the patient had a decrease in activity level because of a fear of falling?  Yes   Is the patient reluctant to leave their home because of a fear of falling?  No   Home Environment   Living Environment Private residence   Living Arrangements Alone   [place] of [place] to enter   Entrance Stairs-Number of Steps 4   Entrance Stairs-Rails Right   Home Layout One level   Additional Comments down steps more difficult   Prior Function   Level of Independence Independent   Vocation Full time employment  admitting walk and sit   Leisure yard work   Observation/Other Assessments   Focus on Therapeutic Outcomes (FOTO)  44% limitation   Posture/Postural Control   Posture Comments posterior view popliteal fossa fuller in appearance   ROM / Strength   AROM / PROM / Strength AROM   AROM   AROM Assessment Site Knee   Right/Left Knee Right;Left   Right Knee Extension 0   Right Knee Flexion 120   Left Knee Extension 120   Left Knee Flexion 0   Strength   Strength Assessment Site Hip;Knee   Right/Left Hip Right;Left   Right Hip Flexion 4/5   Right Hip ABduction 4-/5   Left Hip Flexion 4+/5   Left Hip ABduction 4+/5   Right/Left Knee Right;Left   Right Knee Flexion 4+/5   Right Knee Extension 4/5   Left Knee Flexion 4+/5   Left Knee Extension 4+/5   Palpation   Patella mobility decreased in all planes   Palpation comment tender points in quads, anterior tibialis; lateral crepitus right > left;  marked tenderness in 1 spot medial right knee                     [facility] Adult PT Treatment/Exercise - 07/15/15 0001    Knee/Hip Exercises: Stretches   Active Hamstring Stretch Right;3 reps;20 seconds  on stairs   Hip Flexor Stretch Right;3 reps;20 seconds   Other Knee/Hip Stretches Butterfly stretch x 3 with 20   Knee/Hip Exercises: Aerobic    Stationary Bike L 2  x 10 min   Knee/Hip Exercises: Standing   Forward Step Up Both;2 sets;10 reps;Hand Hold: 1;Step Height: 6"   Rocker Board 3 minutes   Rebounder 3 ways x 1 minute each   Moist Heat Therapy  Number Minutes Moist Heat 15 Minutes   Moist Heat Location Knee                  PT Short Term Goals - 07/07/15 1544    PT SHORT TERM GOAL #2   Title Pain improved by 25% with home and work ADLs   Status Achieved   PT SHORT TERM GOAL #3   Title Patient will report a 25% improvement in night discomfort   Status Achieved           PT Long Term Goals - 07/15/15 1720    PT LONG TERM GOAL #1   Title The patient will be independent in HEP and self management techniques   08/04/15   Time 8   Period Weeks   Status On-going   PT LONG TERM GOAL #2   Title The patient will have improved left hip abd strength to 4-/5 needed for standing/walking longer periods of time    Time 8   Period Weeks   Status Achieved   PT LONG TERM GOAL #3   Title The patient will have improved quad strength to 4+/5 needed for descending steps with greater ease  as of May 3rd 4/5   Time 8   Period Weeks   Status Partially Met   PT LONG TERM GOAL #4   Title The patient will report a 50% improvement in pain with home and work tasks   30% Improvement   Time 8   Period Weeks   Status Partially Met   PT LONG TERM GOAL #5   Title FOTO functional outcome score improved from 50% to 44% indicating improved function with less pain   Time 8   Period Weeks   Status Achieved               Plan - 07/15/15 1607    Clinical Impression Statement Pt with slighly improved hip flexor flexibility. Foto score was improved by 44% as of May 1st. Pt reports less pain with standing and walking. Pt with decreased tension and tenderness/trigger points in the Rt quads and adductors. Pt will benefit from skilled PT for flexibility, manual therapy, strength and Dry Needeling. to reduce pain.    Rehab  Potential Good   PT Frequency 2x / week   PT Duration 8 weeks   PT Treatment/Interventions Cryotherapy;Electrical Stimulation;Moist Heat;Iontophoresis 59m/ml Dexamethasone;Therapeutic exercise;Neuromuscular re-education;Patient/family education;Manual techniques;Taping;Dry needling   PT Next Visit Plan 1 more session of DN if needed,  glut medius strength, soft tissue, gastroc and hamstring flexiblity; fat pad taping. See what MD has to say.   Consulted and Agree with Plan of Care Patient      Patient will benefit from skilled therapeutic intervention in order to improve the following deficits and impairments:  Pain, Decreased strength, Difficulty walking, Increased muscle spasms, Impaired flexibility  Visit Diagnosis: Pain in right knee  Muscle weakness (generalized)     Problem List Patient Active Problem List   Diagnosis Date Noted  . Diabetes ([place] 01/08/2014  . Essential hypertension 01/08/2014  . Hyperlipidemia 01/08/2014  . Postoperative anemia due to acute blood loss 11/22/2012  . Hyponatremia 11/06/2012  . OA (osteoarthritis) of knee 11/05/2012  . Villonodular synovitis of knee 04/20/2011     ERivka Barbara PTA 07/15/2015 5:24 PM  [facility] [address] [place] [address]Phone: [phone]  Fax:  [phone] Name: Stefanie MCCURLEYMRN: [id number] of Birth: 1May 10, 1952

## 2015-07-16 DIAGNOSIS — Z471 Aftercare following joint replacement surgery: Secondary | ICD-10-CM | POA: Diagnosis not present

## 2015-07-16 DIAGNOSIS — Z96653 Presence of artificial knee joint, bilateral: Secondary | ICD-10-CM | POA: Diagnosis not present

## 2015-07-16 MED FILL — METHOCARBAMOL 500 MG TABLET: 500 | 13 days supply | Qty: 40 | Fill #0

## 2015-07-21 ENCOUNTER — Ambulatory Visit: Payer: 59 | Admitting: Physical Therapy

## 2015-07-21 ENCOUNTER — Encounter: Payer: Self-pay | Admitting: Physical Therapy

## 2015-07-21 DIAGNOSIS — M25561 Pain in right knee: Secondary | ICD-10-CM | POA: Diagnosis not present

## 2015-07-21 DIAGNOSIS — M6281 Muscle weakness (generalized): Secondary | ICD-10-CM

## 2015-07-21 NOTE — Therapy (Signed)
[facility] [address] [place], [place], [address] Phone: [phone]   Fax:  [phone]  Physical Therapy Treatment  Patient Details  Name: [name] MRN: [id number] Date of Birth: [birth year] Referring Provider: Gerrit Halls  Encounter Date: 07/21/2015      PT End of Session - 07/21/15 1553    Visit Number 12   Number of Visits 20   Date for PT Re-Evaluation 08/04/15   Authorization Type [facility] but of Medicare age (will apply G codes)   PT Start Time May 14, 1528   PT Stop Time 05-14-13   PT Time Calculation (min) 45 min   Activity Tolerance Patient tolerated treatment well   Behavior During Therapy [facility] for tasks assessed/performed      Past Medical History  Diagnosis Date  . Hypertension   . Arthritis   . History of kidney stones   . 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 Pinched nerve in neck   . Knee pain, right   . Swelling of knee joint, right   . Hyperlipidemia   . Hypothyroidism   . Diabetes mellitus [facility]  . Synovitis of knee RIGHT  . OSA on CPAP   . Osteoarthritis   . Depression   . GERD (gastroesophageal reflux disease)     occasional  . Pneumonia     hx of  . Anemia     hx of  . PONV (postoperative nausea and vomiting)     for 3-4 days after general anesthesia    Past Surgical History  Procedure Laterality Date  . Tonsillectomy and adenoidectomy  child  . Total knee arthroplasty  12-14-2009    RIGHT  . Pulley release left long finger  07-14-2009  . Left carpal tunnel / left middle & ring finger trigger release  08-26-2008  . Right carpal tunnel/ right thumb trigger release's  11-28-2006  . Shoulder arthroscopy w/ acromial repair  11-29-2005    LEFT  . Left shoulder arthroscopy/ left thumb trigger release  02-22-2005  . Shoulder arthroscopy distal clavicle excision and open rotator cuff repair  09-07-2004    LEFT  . Right shoulder arthroscopy w/ rotator cuff repair   01-13-2004  . Left shoulder arthroscopy w/ debridement  09-09-2003  . Cesarean section  X3  . Knee arthroscopy  04/20/2011    Procedure: ARTHROSCOPY KNEE;  Surgeon: Gearlean Alf, MD;  Location: [facility];  Service: Orthopedics;  Laterality: Right;  WITH SYNOVECTOMY  . Total knee arthroplasty Left 11/05/2012    Procedure: LEFT TOTAL KNEE ARTHROPLASTY;  Surgeon: Gearlean Alf, MD;  Location: [facility];  Service: Orthopedics;  Laterality: Left;  . Trigger finger release Right 02/21/2013    Procedure: RIGHT RING A-1 PULLEY RELEASE    (MINOR PROCEDURE) ;  Surgeon: Cammie Sickle., MD;  Location: [facility];  Service: Orthopedics;  Laterality: Right;    There were no vitals filed for this visit.      Subjective Assessment - 07/21/15 1547    Subjective Right knee is still painful rated as a 3/10 today what is a good day, descending stairs is most difficult   Pertinent History Diabetes, HTN ;  May 14, 2009 right knee TKR, 05/15/11 left TKR;  hx of left hip bursitis   Limitations House hold activities;Walking;Standing   How long can you walk comfortably? slower getting moving    Diagnostic tests bone scans and x-rays   Patient Stated  Goals I don't want to have surgery but lessen the pain;     Currently in Pain? Yes   Pain Score 3    Pain Location Knee   Pain Orientation Right   Pain Descriptors / Indicators Aching   Pain Type Chronic pain   Pain Onset More than a month ago   Pain Frequency Constant   Aggravating Factors  bending knees, descending stairs, night time   Pain Relieving Factors rest, rubbing, stretching, ice, tape   Multiple Pain Sites Yes   Pain Score --  occasionally pain                         [facility] Adult PT Treatment/Exercise - 07/21/15 0001    Posture/Postural Control   Posture Comments posterior view popliteal fossa fuller in appearance   Knee/Hip Exercises: Stretches   Active Hamstring Stretch Right;3 reps;20 seconds  on  stairs   Hip Flexor Stretch Right;3 reps;20 seconds   Other Knee/Hip Stretches on hold   Knee/Hip Exercises: Aerobic   Stationary Bike L 2  x 10 min   Knee/Hip Exercises: Machines for Strengthening   Cybex Leg Press st#5 70# x10 with B LE   Knee/Hip Exercises: Standing   Forward Step Up Both;2 sets;10 reps;Hand Hold: 1;Step Height: 6"   Rocker Board 3 minutes   Rebounder 3 ways x 1 minute each   Moist Heat Therapy   Number Minutes Moist Heat 15 Minutes   Moist Heat Location Knee   Electrical Stimulation   Electrical Stimulation Location Rt adductors   Electrical Stimulation Action IFC   Electrical Stimulation Parameters to pt's tolerance   Electrical Stimulation Goals Pain   Manual Therapy   Manual Therapy Soft tissue mobilization;Myofascial release   Soft tissue mobilization VMO, VL soft tissue                  PT Short Term Goals - 07/07/15 1544    PT SHORT TERM GOAL #2   Title Pain improved by 25% with home and work ADLs   Status Achieved   PT SHORT TERM GOAL #3   Title Patient will report a 25% improvement in night discomfort   Status Achieved           PT Long Term Goals - 07/15/15 1720    PT LONG TERM GOAL #1   Title The patient will be independent in HEP and self management techniques   08/04/15   Time 8   Period Weeks   Status On-going   PT LONG TERM GOAL #2   Title The patient will have improved left hip abd strength to 4-/5 needed for standing/walking longer periods of time    Time 8   Period Weeks   Status Achieved   PT LONG TERM GOAL #3   Title The patient will have improved quad strength to 4+/5 needed for descending steps with greater ease  as of May 3rd 4/5   Time 8   Period Weeks   Status Partially Met   PT LONG TERM GOAL #4   Title The patient will report a 50% improvement in pain with home and work tasks   30% Improvement   Time 8   Period Weeks   Status Partially Met   PT LONG TERM GOAL #5   Title FOTO functional outcome score  improved from 50% to 44% indicating improved function with less pain   Time 8   Period Weeks  Status Achieved               Plan - 07/21/15 1557    Clinical Impression Statement Pt unable to walk for longer periods since the leg will start to hurt. Pt with tenderness/trigger points in the right quads and adductors. Pt will benefit from skilled PT for flexibility, manual therapy, strength and dry needeling to reduce pain.    Rehab Potential Good   PT Frequency 2x / week   PT Duration 8 weeks   PT Treatment/Interventions Cryotherapy;Electrical Stimulation;Moist Heat;Iontophoresis 22m/ml Dexamethasone;Therapeutic exercise;Neuromuscular re-education;Patient/family education;Manual techniques;Taping;Dry needling   PT Next Visit Plan 1 more session of DN if needed,  glut medius strength, soft tissue, gastroc and hamstring flexiblity;.Marland KitchenD      Patient will benefit from skilled therapeutic intervention in order to improve the following deficits and impairments:  Pain, Decreased strength, Difficulty walking, Increased muscle spasms, Impaired flexibility  Visit Diagnosis: Pain in right knee  Muscle weakness (generalized)     Problem List Patient Active Problem List   Diagnosis Date Noted  . Diabetes ([place] 01/08/2014  . Essential hypertension 01/08/2014  . Hyperlipidemia 01/08/2014  . Postoperative anemia due to acute blood loss 11/22/2012  . Hyponatremia 11/06/2012  . OA (osteoarthritis) of knee 11/05/2012  . Villonodular synovitis of knee 04/20/2011    NAUMANN-HOUEGNIFIO,Josslin Sanjuan PTA 07/21/2015, 5:06 PM  [facility] [address] [place] [address]Phone: [phone]  Fax:  [phone] Name: BAUBRIELLE STROUDMRN: [id number] of Birth: 1February 10, 1952

## 2015-07-28 ENCOUNTER — Ambulatory Visit: Payer: 59 | Admitting: Physical Therapy

## 2015-07-28 DIAGNOSIS — M25561 Pain in right knee: Secondary | ICD-10-CM | POA: Diagnosis not present

## 2015-07-28 DIAGNOSIS — M6281 Muscle weakness (generalized): Secondary | ICD-10-CM

## 2015-07-28 NOTE — Therapy (Signed)
[facility] [address], [place], [place], [address] Phone: [phone]   Fax:  [phone]  Physical Therapy Treatment  Patient Details  Name: [name] MRN: [id number] Date of Birth: 1951/01/12 Referring Provider: Gerrit Halls  Encounter Date: 07/28/2015      PT End of Session - 07/28/15 1653    Visit Number 13   Number of Visits 20   Date for PT Re-Evaluation 08/04/15   Authorization Type [facility] but of Medicare age (will apply G codes)   PT Start Time 1528/05/08   PT Stop Time 05-08-18   PT Time Calculation (min) 50 min   Activity Tolerance Patient tolerated treatment well      Past Medical History  Diagnosis Date  . Hypertension   . Arthritis   . History of kidney stones   . 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 Pinched nerve in neck   . Knee pain, right   . Swelling of knee joint, right   . Hyperlipidemia   . Hypothyroidism   . Diabetes mellitus [facility]  . Synovitis of knee RIGHT  . OSA on CPAP   . Osteoarthritis   . Depression   . GERD (gastroesophageal reflux disease)     occasional  . Pneumonia     hx of  . Anemia     hx of  . PONV (postoperative nausea and vomiting)     for 3-4 days after general anesthesia    Past Surgical History  Procedure Laterality Date  . Tonsillectomy and adenoidectomy  child  . Total knee arthroplasty  12-14-2009    RIGHT  . Pulley release left long finger  07-14-2009  . Left carpal tunnel / left middle & ring finger trigger release  08-26-2008  . Right carpal tunnel/ right thumb trigger release's  11-28-2006  . Shoulder arthroscopy w/ acromial repair  11-29-2005    LEFT  . Left shoulder arthroscopy/ left thumb trigger release  02-22-2005  . Shoulder arthroscopy distal clavicle excision and open rotator cuff repair  09-07-2004    LEFT  . Right shoulder arthroscopy w/ rotator cuff repair  01-13-2004  . Left shoulder arthroscopy w/ debridement   09-09-2003  . Cesarean section  X3  . Knee arthroscopy  04/20/2011    Procedure: ARTHROSCOPY KNEE;  Surgeon: Gearlean Alf, MD;  Location: [facility];  Service: Orthopedics;  Laterality: Right;  WITH SYNOVECTOMY  . Total knee arthroplasty Left 11/05/2012    Procedure: LEFT TOTAL KNEE ARTHROPLASTY;  Surgeon: Gearlean Alf, MD;  Location: [facility];  Service: Orthopedics;  Laterality: Left;  . Trigger finger release Right 02/21/2013    Procedure: RIGHT RING A-1 PULLEY RELEASE    (MINOR PROCEDURE) ;  Surgeon: Cammie Sickle., MD;  Location: [facility];  Service: Orthopedics;  Laterality: Right;    There were no vitals filed for this visit.      Subjective Assessment - 07/28/15 1535    Subjective Busy at work.  Anterior/medial knee pain/soreness.  I saw Dr. Maureen Ralphs 2 weeks ago and he recommended continuation of ex.  I really feel like I have plateaued.  Less night pain. No improvement with descending stairs (fearful).  Overall 40% better.  Some days more.       Currently in Pain? Yes   Pain Score 2    Pain Location Knee   Pain Orientation Right   Pain Type Chronic pain  Pain Onset More than a month ago   Pain Frequency Constant                         [facility] Adult PT Treatment/Exercise - 07/28/15 0001    Knee/Hip Exercises: Aerobic   Nustep Level 2 x 10 minutes  seat 6, arms 8   Knee/Hip Exercises: Standing   Step Down Right;1 set;10 reps  no step heel taps 10x   Other Standing Knee Exercises trendelenberg gluteal squeeze 4x5   Other Standing Knee Exercises retro step 20x   Moist Heat Therapy   Number Minutes Moist Heat 10 Minutes   Moist Heat Location Knee   Manual Therapy   Manual therapy comments Instruction in use of foam roll   Soft tissue mobilization VMO, VL soft tissue   McConnell --          Trigger Point Dry Needling - 07/28/15 1652    Consent Given? Yes   Tensor Fascia Lata Response Twitch response  elicited;Palpable increased muscle length   Quadriceps Response Twitch response elicited;Palpable increased muscle length                PT Short Term Goals - 07/28/15 2315    PT SHORT TERM GOAL #1   Title The patient will have basic knowledge of HEP to increase muscle length and initiate strengthening to correct muscle imbalances  07/07/15   Status Achieved   PT SHORT TERM GOAL #2   Title Pain improved by 25% with home and work ADLs   Status Achieved   PT SHORT TERM GOAL #3   Title Patient will report a 25% improvement in night discomfort   Status Achieved           PT Long Term Goals - 07/28/15 2315    PT LONG TERM GOAL #1   Title The patient will be independent in HEP and self management techniques   08/04/15   Time 8   Period Weeks   Status On-going   PT LONG TERM GOAL #2   Title The patient will have improved left hip abd strength to 4-/5 needed for standing/walking longer periods of time    Status Achieved   PT LONG TERM GOAL #3   Title The patient will have improved quad strength to 4+/5 needed for descending steps with greater ease   Time 8   Period Weeks   Status Partially Met   PT LONG TERM GOAL #4   Title The patient will report a 50% improvement in pain with home and work tasks    Time 8   Period Weeks   Status Partially Met   PT LONG TERM GOAL #5   Title FOTO functional outcome score improved from 50% to 44% indicating improved function with less pain   Status Achieved               Plan - 07/28/15 2312    Clinical Impression Statement Improvement in soft tissue length and considerably less tender/trigger points in right quads however overall improvement in pain rated at only 40%.  She states she does have decreased episodes of catching.  She is still fearful of descending steps and treatment focus today on exercises so correct this deficiency.  Approaching max potential with PT and anticipate discharge from PT next visit.     PT Next Visit  Plan assess response to DN #5 and progress toward remaining goals;  FOTO;  plan for discharge  next visit.        Patient will benefit from skilled therapeutic intervention in order to improve the following deficits and impairments:     Visit Diagnosis: Pain in right knee  Muscle weakness (generalized)     Problem List Patient Active Problem List   Diagnosis Date Noted  . Diabetes ([place]) 01/08/2014  . Essential hypertension 01/08/2014  . Hyperlipidemia 01/08/2014  . Postoperative anemia due to acute blood loss 11/22/2012  . Hyponatremia 11/06/2012  . OA (osteoarthritis) of knee 11/05/2012  . Villonodular synovitis of knee 04/20/2011     Ruben Im, PT 07/28/2015 11:17 PM Phone: [phone] Fax: [phone] Alvera Singh 07/28/2015, 11:16 PM  [facility] [address] [place], [place], [address] Phone: [phone]   Fax:  [phone]  Name: [name] MRN: [id number] Date of Birth: 1951/01/26

## 2015-08-03 ENCOUNTER — Encounter: Payer: Self-pay | Admitting: Physical Therapy

## 2015-08-03 ENCOUNTER — Ambulatory Visit: Payer: 59 | Admitting: Physical Therapy

## 2015-08-03 DIAGNOSIS — M25561 Pain in right knee: Secondary | ICD-10-CM | POA: Diagnosis not present

## 2015-08-03 DIAGNOSIS — M6281 Muscle weakness (generalized): Secondary | ICD-10-CM | POA: Diagnosis not present

## 2015-08-03 NOTE — Therapy (Addendum)
[facility] [address] [place], [place], [address] Phone: [phone]   Fax:  [phone]  Physical Therapy Treatment/Discharge Summary  Patient Details  Name: [name] MRN: [id number] Date of Birth: [birth year] Referring Provider: Gerrit Halls  Encounter Date: 08/03/2015      PT End of Session - 08/03/15 1549    Visit Number 14   Number of Visits 20   Date for PT Re-Evaluation 08/04/15   Authorization Type [facility] but of Medicare age (will apply G codes)   PT Start Time 05-May-1525   PT Stop Time 05-May-1625   PT Time Calculation (min) 60 min   Activity Tolerance Patient tolerated treatment well   Behavior During Therapy [facility] for tasks assessed/performed      Past Medical History  Diagnosis Date  . Hypertension   . Arthritis   . History of kidney stones   . 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 Pinched nerve in neck   . Knee pain, right   . Swelling of knee joint, right   . Hyperlipidemia   . Hypothyroidism   . Diabetes mellitus [facility]  . Synovitis of knee RIGHT  . OSA on CPAP   . Osteoarthritis   . Depression   . GERD (gastroesophageal reflux disease)     occasional  . Pneumonia     hx of  . Anemia     hx of  . PONV (postoperative nausea and vomiting)     for 3-4 days after general anesthesia    Past Surgical History  Procedure Laterality Date  . Tonsillectomy and adenoidectomy  child  . Total knee arthroplasty  12-14-2009    RIGHT  . Pulley release left long finger  07-14-2009  . Left carpal tunnel / left middle & ring finger trigger release  08-26-2008  . Right carpal tunnel/ right thumb trigger release's  11-28-2006  . Shoulder arthroscopy w/ acromial repair  11-29-2005    LEFT  . Left shoulder arthroscopy/ left thumb trigger release  02-22-2005  . Shoulder arthroscopy distal clavicle excision and open rotator cuff repair  09-07-2004    LEFT  . Right shoulder arthroscopy w/  rotator cuff repair  01-13-2004  . Left shoulder arthroscopy w/ debridement  09-09-2003  . Cesarean section  X3  . Knee arthroscopy  04/20/2011    Procedure: ARTHROSCOPY KNEE;  Surgeon: Gearlean Alf, MD;  Location[facility];  Service: Orthopedics;  Laterality: Right;  WITH SYNOVECTOMY  . Total knee arthroplasty Left 11/05/2012    Procedure: LEFT TOTAL KNEE ARTHROPLASTY;  Surgeon: Gearlean Alf, MD;  Location: [facility];  Service: Orthopedics;  Laterality: Left;  . Trigger finger release Right 02/21/2013    Procedure: RIGHT RING A-1 PULLEY RELEASE    (MINOR PROCEDURE) ;  Surgeon: Cammie Sickle., MD;  Location: [facility];  Service: Orthopedics;  Laterality: Right;    There were no vitals filed for this visit.      Subjective Assessment - 08/03/15 1539    Subjective Pt rates her improvent as 50% since start of PT. Pt rpeorts she is working out at [employer] in the pool, PTA recommended to also start using the bicycle.    Pertinent History Diabetes, HTN ;  2009/05/05 right knee TKR, 05-06-11 left TKR;  hx of left hip bursitis   Limitations House hold activities;Walking;Standing   How long can you walk comfortably? slower getting moving  Diagnostic tests bone scans and x-rays   Patient Stated Goals I don't want to have surgery but lessen the pain;     Currently in Pain? Yes   Pain Score 3    Pain Location Knee   Pain Orientation Right   Pain Descriptors / Indicators Aching   Pain Type Chronic pain   Pain Onset More than a month ago   Pain Frequency Constant   Aggravating Factors  bending knees, descendidng stairs, night time   Pain Relieving Factors rest, rubbing, stretching, ice, tape   Multiple Pain Sites Yes            [facility] PT Assessment - 08/03/15 0001    Assessment   Medical Diagnosis right knee replacement    Referring Provider Gerrit Halls   Onset Date/Surgical Date --  5 month   Hand Dominance Right   Precautions   Precautions None    Restrictions   Weight Bearing Restrictions No   Balance Screen   Has the patient fallen in the past 6 months No   Has the patient had a decrease in activity level because of a fear of falling?  Yes   Is the patient reluctant to leave their home because of a fear of falling?  No   Home Environment   Living Environment Private residence   Living Arrangements Alone   Type of [place] Access Stairs to enter   Entrance Stairs-Number of Steps 4   Entrance Stairs-Rails Right   Home Layout One level   Additional Comments down steps more difficult   Prior Function   Level of Independence Independent   Vocation Full time employment   Leisure yard work   Observation/Other Assessments   Focus on Therapeutic Outcomes (FOTO)  44% limitation   Posture/Postural Control   Posture Comments posterior view popliteal fossa fuller in appearance   ROM / Strength   AROM / PROM / Strength AROM   AROM   AROM Assessment Site Knee   Right/Left Knee Right   Right Knee Extension 0   Right Knee Flexion 122   Left Knee Extension 120   Left Knee Flexion 0   Strength   Strength Assessment Site Hip;Knee   Right/Left Hip Right;Left   Right Hip Flexion 4+/5   Right Hip ABduction 4/5   Left Hip Flexion 4+/5   Left Hip ABduction 4+/5   Right/Left Knee Right;Left   Right Knee Flexion 4+/5   Right Knee Extension 4+/5   Left Knee Flexion 4+/5   Left Knee Extension 4+/5   Palpation   Patella mobility decreased in all planes   Palpation comment tender points in quads, anterior tibialis; lateral crepitus right > left;  marked tenderness in 1 spot medial right knee                     [facility] Adult PT Treatment/Exercise - 08/03/15 0001    Knee/Hip Exercises: Aerobic   Stationary Bike L 2  x 10 min   Knee/Hip Exercises: Standing   Step Down Right;1 set;10 reps  tapping down with left   Other Standing Knee Exercises trendelenberg gluteal squeeze 4x5   Other Standing Knee Exercises retro  step 20x   Moist Heat Therapy   Number Minutes Moist Heat 10 Minutes   Moist Heat Location Knee   Manual Therapy   Manual Therapy Soft tissue mobilization;Myofascial release   Manual therapy comments pt used used foam roll    Soft  tissue mobilization VMO, VL soft tissue                  PT Short Term Goals - 07/28/15 2315    PT SHORT TERM GOAL #1   Title The patient will have basic knowledge of HEP to increase muscle length and initiate strengthening to correct muscle imbalances  07/07/15   Status Achieved   PT SHORT TERM GOAL #2   Title Pain improved by 25% with home and work ADLs   Status Achieved   PT SHORT TERM GOAL #3   Title Patient will report a 25% improvement in night discomfort   Status Achieved           PT Long Term Goals - 08/03/15 1721    PT LONG TERM GOAL #1   Title The patient will be independent in HEP and self management techniques   08/04/15   Time 8   Period Weeks   Status Achieved   PT LONG TERM GOAL #2   Title The patient will have improved left hip abd strength to 4-/5 needed for standing/walking longer periods of time    Time 8   Period Weeks   Status Achieved   PT LONG TERM GOAL #3   Title The patient will have improved quad strength to 4+/5 needed for descending steps with greater ease   Time 8   Period Weeks   Status Achieved   PT LONG TERM GOAL #4   Title The patient will report a 50% improvement in pain with home and work tasks    Time 8   Period Weeks   Status Achieved   PT LONG TERM GOAL #5   Title FOTO functional outcome score improved from 50% to 44% indicating improved function with less pain   Time 8   Period Weeks   Status Achieved               Plan - 08/03/15 1550    Clinical Impression Statement Pt rates her improvement as 50% in Rt knee. Pt with improvement in hip and knee strength and slighly improved AROM Rt knee, see evaluation section. Foto scored 44%. Pt with less trigger points in right quadricesps.  She is advised to return to PT in fall for muscle release, dry needeling and strength, to continue to address chronic condition of rigth knee    Rehab Potential Good   PT Frequency 2x / week   PT Duration 8 weeks   PT Treatment/Interventions Cryotherapy;Electrical Stimulation;Moist Heat;Iontophoresis 52m/ml Dexamethasone;Therapeutic exercise;Neuromuscular re-education;Patient/family education;Manual techniques;Taping;Dry needling   PT Next Visit Plan D/C   Consulted and Agree with Plan of Care Patient     G code:  Mobility moving around  Goal CK, Discharge CK  PHYSICAL THERAPY DISCHARGE SUMMARY  Visits from Start of Care: 15  Current functional level related to goals / functional outcomes: See above.  Overall 50% improvement.   Functional outcome score did improve from 50% to 44% as predicted.  Partial goals met.   Remaining deficits: Still intermittent catching and clicking but decreased frequency   Education / Equipment: HEP Plan: Patient agrees to discharge.  Patient goals were met. Patient is being discharged due to meeting the stated rehab goals.  ?????       Patient will benefit from skilled therapeutic intervention in order to improve the following deficits and impairments:  Pain, Decreased strength, Difficulty walking, Increased muscle spasms, Impaired flexibility  Visit Diagnosis: Pain in right knee  Muscle weakness (  generalized)     Problem List Patient Active Problem List   Diagnosis Date Noted  . Diabetes ([place]) 01/08/2014  . Essential hypertension 01/08/2014  . Hyperlipidemia 01/08/2014  . Postoperative anemia due to acute blood loss 11/22/2012  . Hyponatremia 11/06/2012  . OA (osteoarthritis) of knee 11/05/2012  . Villonodular synovitis of knee 04/20/2011   [name], PT 08/06/2015 7:45 AM Phone: [phone] Fax: [phone]  NAUMANN-HOUEGNIFIO,[name] PTA 08/03/2015, 5:25 PM  [facility] [address], [place], [place], [address] Phone: [phone]   Fax:  [phone]  Name: [name] MRN: [id number] Date of Birth: 05-Mar-1951

## 2015-08-04 DIAGNOSIS — Z794 Long term (current) use of insulin: Secondary | ICD-10-CM | POA: Diagnosis not present

## 2015-08-04 DIAGNOSIS — E1165 Type 2 diabetes mellitus with hyperglycemia: Secondary | ICD-10-CM | POA: Diagnosis not present

## 2015-08-04 DIAGNOSIS — E113299 Type 2 diabetes mellitus with mild nonproliferative diabetic retinopathy without macular edema, unspecified eye: Secondary | ICD-10-CM | POA: Diagnosis not present

## 2015-08-04 MED FILL — HUMALOG 100 UNITS/ML KWIKPE: 100 | 75 days supply | Qty: 6 | Fill #0

## 2015-08-04 MED FILL — UNIFINE PENTIPS 32GX5/32: 32G X 4 MM | 90 days supply | Qty: 100 | Fill #0

## 2015-08-05 MED FILL — VICTOZA 18 MG/3 ML INJECT P: 18 | 30 days supply | Qty: 9 | Fill #0

## 2015-08-05 MED FILL — BD UF INS SYR 1 ML 31GX5/16: 31G X 5/16" | 50 days supply | Qty: 100 | Fill #0

## 2015-08-11 ENCOUNTER — Encounter: Payer: 59 | Admitting: Physical Therapy

## 2015-08-11 MED FILL — DULoxetine HCL 60 MG CPEP: 60 | 30 days supply | Qty: 30 | Fill #10

## 2015-08-11 MED FILL — VALSARTAN-HCTZ 320-25 MG TA: 320-25 | 90 days supply | Qty: 90 | Fill #2

## 2015-08-11 MED FILL — OMEPRAZOLE DR 20 MG CAPSULE: 20 | 30 days supply | Qty: 30 | Fill #4

## 2015-08-13 ENCOUNTER — Ambulatory Visit: Payer: 59

## 2015-08-26 DIAGNOSIS — I129 Hypertensive chronic kidney disease with stage 1 through stage 4 chronic kidney disease, or unspecified chronic kidney disease: Secondary | ICD-10-CM | POA: Diagnosis not present

## 2015-08-26 DIAGNOSIS — G4733 Obstructive sleep apnea (adult) (pediatric): Secondary | ICD-10-CM | POA: Diagnosis not present

## 2015-08-26 DIAGNOSIS — E039 Hypothyroidism, unspecified: Secondary | ICD-10-CM | POA: Diagnosis not present

## 2015-08-26 DIAGNOSIS — E113299 Type 2 diabetes mellitus with mild nonproliferative diabetic retinopathy without macular edema, unspecified eye: Secondary | ICD-10-CM | POA: Diagnosis not present

## 2015-08-26 DIAGNOSIS — E1165 Type 2 diabetes mellitus with hyperglycemia: Secondary | ICD-10-CM | POA: Diagnosis not present

## 2015-08-26 DIAGNOSIS — K219 Gastro-esophageal reflux disease without esophagitis: Secondary | ICD-10-CM | POA: Diagnosis not present

## 2015-08-26 DIAGNOSIS — Z862 Personal history of diseases of the blood and blood-forming organs and certain disorders involving the immune mechanism: Secondary | ICD-10-CM | POA: Diagnosis not present

## 2015-08-26 DIAGNOSIS — Z6841 Body Mass Index (BMI) 40.0 and over, adult: Secondary | ICD-10-CM | POA: Diagnosis not present

## 2015-08-26 DIAGNOSIS — I10 Essential (primary) hypertension: Secondary | ICD-10-CM | POA: Diagnosis not present

## 2015-09-11 DIAGNOSIS — G4733 Obstructive sleep apnea (adult) (pediatric): Secondary | ICD-10-CM | POA: Diagnosis not present

## 2015-09-13 MED FILL — AMLODIPINE BESYLATE 5 MG TA: 5 | 90 days supply | Qty: 90 | Fill #1

## 2015-09-13 MED FILL — OMEPRAZOLE DR 20 MG CAPSULE: 20 | 30 days supply | Qty: 30 | Fill #5

## 2015-09-13 MED FILL — VICTOZA 18 MG/3 ML INJECT P: 18 | 30 days supply | Qty: 9 | Fill #1

## 2015-09-14 MED FILL — DULoxetine HCL 60 MG CPEP: 60 | 30 days supply | Qty: 30 | Fill #0

## 2015-09-14 MED FILL — ATORVASTATIN 40 MG TABLET: 40 | 90 days supply | Qty: 90 | Fill #1

## 2015-10-04 MED FILL — LEVOTHYROXINE 100 MCG TAB: 100 | 90 days supply | Qty: 90 | Fill #2

## 2015-10-04 MED FILL — LANTUS 100 UNITS/ML VIAL: 100 | 90 days supply | Qty: 90 | Fill #4

## 2015-10-06 MED FILL — OMEPRAZOLE DR 20 MG CAPSULE: 20 | 90 days supply | Qty: 90 | Fill #0

## 2015-10-06 MED FILL — HUMALOG 100 UNITS/ML KWIKPE: 100 | 75 days supply | Qty: 6 | Fill #1

## 2015-10-20 MED FILL — VICTOZA 18 MG/3 ML INJECT P: 18 | 30 days supply | Qty: 9 | Fill #2

## 2015-10-20 MED FILL — DULoxetine HCL 60 MG CPEP: 60 | 30 days supply | Qty: 30 | Fill #1

## 2015-10-20 MED FILL — METFORMIN HCL ER 500 MG TAB: 500 | 90 days supply | Qty: 180 | Fill #3

## 2015-10-27 DIAGNOSIS — Z0100 Encounter for examination of eyes and vision without abnormal findings: Secondary | ICD-10-CM | POA: Diagnosis not present

## 2015-11-02 ENCOUNTER — Other Ambulatory Visit: Payer: Self-pay | Admitting: Pharmacist

## 2015-11-02 ENCOUNTER — Encounter: Payer: Self-pay | Admitting: Pharmacist

## 2015-11-02 NOTE — Patient Outreach (Signed)
[facility] Care Management  [facility]   11/02/2015  Alinda DeemBonnie D Collison Jun 06, 1950 [id number]  Subjective: Patient presents today for diabetes follow-up as part of the employer-sponsored Link to Wellness program.  Current diabetes regimen includes Lantus 58 units twice daily, Humalog 8 units before supper, Victoza 1.8 mg daily and metformin 1000 mg daily.  Patient also continues on daily aspirin, valsartan and atorvastatin.   Patient reported dietary habits: Eats 3 meals/day Breakfast: protein bar or a handful of nuts Lunch: turkey/chicken sandwich + celery or carrots Dinner: meat + vegetables  Snacks:carrots or a piece of fruit, peanut better on bread Drinks: water, sweet tea (splenda sweetened)  Pt reports that when she has high BG, she knows what foods triggered it (oodles of noodles)   Patient reported exercise habits: none. 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 She reports she had a pedometer but has misplaced in since July. Pt reports she was getting 10k steps/day previously.  Swimming 3 times/week (aerobics and laps)    Patient reports one hypoglycemic episode over the past 3 weeks and reports proper treatment Patient reports nocturia once per night.  Patient denies pain/burning upon urination.  Patient denies neuropathy. Patient denies visual changes. Patient reports self foot exams. Denies changes She reports taking Humalog 1 hour after meals.   Objective:  Lab Results  Component Value Date   HGBA1C 7.9 09/18/2014   08/04/15 A1C: 8.5 Home Blood Glucose: 14 day Average 184 mg/dL    Vitals:   40/98/1108/21/17 [id number]  BP: 129/87  Pulse: 83    Encounter Medications: Outpatient Encounter Prescriptions as of 11/02/2015  Medication Sig  . amLODipine (NORVASC) 5 MG tablet Take 5 mg by mouth daily.   [name]. aspirin EC 81 MG tablet Take 81 mg by mouth daily.  [name]. atorvastatin (LIPITOR) 40 MG tablet Take 40 mg by mouth  daily.  . DULoxetine (CYMBALTA) 60 MG capsule Take 60 mg by mouth daily.  . insulin glargine (LANTUS) 100 UNIT/ML injection Inject 58 Units into the skin 2 (two) times daily.   . insulin lispro (HUMALOG) 100 UNIT/ML injection Inject 8 Units into the skin daily before supper.  . levothyroxine (LEVOXYL) 125 MCG tablet Take 100 mcg by mouth daily.   . Liraglutide (VICTOZA) 18 MG/3ML SOLN Inject 1.8 mg into the skin every morning.   . Melatonin 5 MG CAPS Take 5 mg by mouth at bedtime as needed.  . metFORMIN (GLUCOPHAGE) 500 MG tablet Take 1,000 mg by mouth every evening.   . methocarbamol (ROBAXIN) 500 MG tablet Take 500 mg by mouth 3 (three) times daily as needed for muscle spasms.  [name]. omeprazole (PRILOSEC) 20 MG capsule Take 20 mg by mouth daily.  . valsartan-hydrochlorothiazide (DIOVAN-HCT) 320-25 MG per tablet Take 1 tablet by mouth every evening.   No facility-administered encounter medications on file as of 11/02/2015.     Functional Status: In your present state of health, do you have any difficulty performing the following activities: 11/02/2015  Hearing? N  Vision? N  Difficulty concentrating or making decisions? N  Walking or climbing stairs? N  Dressing or bathing? N  Doing errands, shopping? N  Some recent data might be hidden    Fall/Depression Screening: PHQ 2/9 Scores 11/02/2015 03/19/2015 09/18/2014  PHQ - 2 Score 2 0 2  PHQ- 9 Score 6 - 4     Assessment:  Diabetes: Most recent A1C was 8.5% which is at above  goal of less than 7%. Patient is currently on atorvastatin, aspirin, and ACE inhibitor.   Plan/Goals for Next Visit: -Discussed taking Humalog before meals and not after meals to avoid hypoglycemia -Counseled on signs/symptoms and treatment of hypoglycemia -Patient set goals to check blood glucose 2-3 times daily and to go home today to make meal plan for the month and will continue to do this monthly  -Encouraged patient to continue exercising at the pool for 60  minutes/day for 3 days per week -Provided patient with Contour Next One meter to check blood glucose at work  Next appointment to see me is: 3 months with Leodis Siasaroline   Kelsy Combs, PharmD [facility] PGY2 Pharmacy Resident [phone]  [facility] CM Care Plan Problem One   Flowsheet Row Most Recent Value  Care Plan Problem One  Knowledge deficit of diabetes management related to elevated A1C  Role Documenting the Problem One  Clinical Pharmacist  Care Plan for Problem One  Active  [facility] CM Short Term Goal #2 (0-30 days)  Patient will check blood glucose 2-3 times daily over the next 10 days before her primary care appointment as evidenced by patient report  [facility] #2 Start Date  11/02/15  Interventions for Short Term Goal #2  Provided patient education on importance of checking blood glucose regularly to allow provider to titrate insulin.  Provided patient with [employer] Contour Next One to use at work during lunch.   [facility] CM Short Term Goal #3 (0-30 days)  Patient will plan meals for the next month this week as evidenced by patient report  [facility] CM Short Term Goal #3 Start Date  11/02/15  Interventions for Short Tern Goal #3  Counseled on low carbohydrate diet and meal planning.

## 2015-11-09 MED FILL — VALSARTAN-HCTZ 320-25 MG TA: 320-25 | 90 days supply | Qty: 90 | Fill #0

## 2015-11-12 DIAGNOSIS — Z794 Long term (current) use of insulin: Secondary | ICD-10-CM | POA: Diagnosis not present

## 2015-11-12 DIAGNOSIS — E1165 Type 2 diabetes mellitus with hyperglycemia: Secondary | ICD-10-CM | POA: Diagnosis not present

## 2015-11-12 DIAGNOSIS — E113299 Type 2 diabetes mellitus with mild nonproliferative diabetic retinopathy without macular edema, unspecified eye: Secondary | ICD-10-CM | POA: Diagnosis not present

## 2015-11-13 MED FILL — HUMALOG 100 UNITS/ML KWIKPE: 100 | 85 days supply | Qty: 12 | Fill #0

## 2015-11-18 MED FILL — DULoxetine HCL 60 MG CPEP: 60 | 30 days supply | Qty: 30 | Fill #2

## 2015-11-19 MED FILL — VICTOZA 18 MG/3 ML INJECT P: 18 | 30 days supply | Qty: 9 | Fill #0

## 2015-12-16 DIAGNOSIS — I10 Essential (primary) hypertension: Secondary | ICD-10-CM | POA: Diagnosis not present

## 2015-12-16 DIAGNOSIS — M79671 Pain in right foot: Secondary | ICD-10-CM | POA: Diagnosis not present

## 2015-12-16 MED FILL — INDOMETHACIN 50 MG CAPSULE: 50 | 5 days supply | Qty: 10 | Fill #0

## 2015-12-16 MED FILL — VALSARTAN-HCTZ 320-12.5 MG: 320-12.5 | 30 days supply | Qty: 30 | Fill #0

## 2015-12-21 MED FILL — VICTOZA 18 MG/3 ML INJECT P: 18 | 30 days supply | Qty: 9 | Fill #1

## 2015-12-21 MED FILL — ATORVASTATIN 40 MG TABLET: 40 | 90 days supply | Qty: 90 | Fill #2

## 2015-12-25 MED FILL — LANTUS 100 UNITS/ML VIAL: 100 | 90 days supply | Qty: 90 | Fill #0

## 2015-12-25 MED FILL — AMLODIPINE BESYLATE 5 MG TA: 5 | 90 days supply | Qty: 90 | Fill #2

## 2015-12-25 MED FILL — DULoxetine HCL 60 MG CPEP: 60 | 30 days supply | Qty: 30 | Fill #3

## 2016-01-07 DIAGNOSIS — M79671 Pain in right foot: Secondary | ICD-10-CM | POA: Diagnosis not present

## 2016-01-07 DIAGNOSIS — Z6841 Body Mass Index (BMI) 40.0 and over, adult: Secondary | ICD-10-CM | POA: Diagnosis not present

## 2016-01-07 DIAGNOSIS — E039 Hypothyroidism, unspecified: Secondary | ICD-10-CM | POA: Diagnosis not present

## 2016-01-07 DIAGNOSIS — E7800 Pure hypercholesterolemia, unspecified: Secondary | ICD-10-CM | POA: Diagnosis not present

## 2016-01-07 DIAGNOSIS — E113299 Type 2 diabetes mellitus with mild nonproliferative diabetic retinopathy without macular edema, unspecified eye: Secondary | ICD-10-CM | POA: Diagnosis not present

## 2016-01-07 DIAGNOSIS — Z0000 Encounter for general adult medical examination without abnormal findings: Secondary | ICD-10-CM | POA: Diagnosis not present

## 2016-01-07 DIAGNOSIS — G4701 Insomnia due to medical condition: Secondary | ICD-10-CM | POA: Diagnosis not present

## 2016-01-07 DIAGNOSIS — G4733 Obstructive sleep apnea (adult) (pediatric): Secondary | ICD-10-CM | POA: Diagnosis not present

## 2016-01-07 DIAGNOSIS — I129 Hypertensive chronic kidney disease with stage 1 through stage 4 chronic kidney disease, or unspecified chronic kidney disease: Secondary | ICD-10-CM | POA: Diagnosis not present

## 2016-01-19 DIAGNOSIS — M25571 Pain in right ankle and joints of right foot: Secondary | ICD-10-CM | POA: Diagnosis not present

## 2016-01-19 DIAGNOSIS — M25572 Pain in left ankle and joints of left foot: Secondary | ICD-10-CM | POA: Diagnosis not present

## 2016-01-19 DIAGNOSIS — M792 Neuralgia and neuritis, unspecified: Secondary | ICD-10-CM | POA: Diagnosis not present

## 2016-01-19 DIAGNOSIS — M7751 Other enthesopathy of right foot: Secondary | ICD-10-CM | POA: Diagnosis not present

## 2016-01-19 DIAGNOSIS — M7752 Other enthesopathy of left foot: Secondary | ICD-10-CM | POA: Diagnosis not present

## 2016-01-25 MED FILL — VICTOZA 18 MG/3 ML INJECT P: 18 | 30 days supply | Qty: 9 | Fill #2

## 2016-01-26 DIAGNOSIS — M25571 Pain in right ankle and joints of right foot: Secondary | ICD-10-CM | POA: Diagnosis not present

## 2016-01-26 DIAGNOSIS — M21622 Bunionette of left foot: Secondary | ICD-10-CM | POA: Diagnosis not present

## 2016-01-26 DIAGNOSIS — M21621 Bunionette of right foot: Secondary | ICD-10-CM | POA: Diagnosis not present

## 2016-01-26 DIAGNOSIS — M792 Neuralgia and neuritis, unspecified: Secondary | ICD-10-CM | POA: Diagnosis not present

## 2016-01-26 DIAGNOSIS — M7751 Other enthesopathy of right foot: Secondary | ICD-10-CM | POA: Diagnosis not present

## 2016-01-26 DIAGNOSIS — M10071 Idiopathic gout, right ankle and foot: Secondary | ICD-10-CM | POA: Diagnosis not present

## 2016-01-26 MED FILL — LEVOTHYROXINE 100 MCG TAB: 100 | 90 days supply | Qty: 90 | Fill #0

## 2016-01-26 MED FILL — CONTOUR NEXT STRIPS: 90 days supply | Qty: 300 | Fill #1

## 2016-01-26 MED FILL — METFORMIN HCL ER 500 MG TAB: 500 | 90 days supply | Qty: 180 | Fill #0

## 2016-01-26 MED FILL — DULoxetine HCL 60 MG CPEP: 60 | 30 days supply | Qty: 30 | Fill #4

## 2016-01-26 MED FILL — OMEPRAZOLE 20 MG CAPSULE DR: 20 | 90 days supply | Qty: 90 | Fill #1

## 2016-01-29 MED FILL — HUMALOG 100 UNITS/ML KWIKPE: 100 | 85 days supply | Qty: 12 | Fill #1

## 2016-02-01 ENCOUNTER — Encounter: Payer: Self-pay | Admitting: Pharmacist

## 2016-02-01 ENCOUNTER — Other Ambulatory Visit: Payer: Self-pay | Admitting: Pharmacist

## 2016-02-01 NOTE — Patient Outreach (Addendum)
Subjective: Patient presents today for 3 month diabetes follow-up as part of the employer-sponsored Link to Wellness program.  Current diabetes regimen includes lantus 60 units BID, humalog 14-18 units with supper .  Patient also continues on daily aspirin and atorvastatin.  Most recent MD follow-up was early October.  Pt has pending appointment with podiatrist for bunions.  Med changes since our last visit include increase in lantus and humalog, diagnosis of gout and subsequent reduction of valsartan-HCTZ by 1/2, diagnosis of bunions for which she is getting cortisone shots.   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  Patient reports nocturia -1 time/night.  Patient denies neuropathy. Patient denies visual changes. Patient reports self foot exams.  Patient reported self monitored blood glucose frequency 2x/day  Last A1c = 8.3, per patient, taken in early October  Objective:  Lab Results  Component Value Date   HGBA1C 7.9 09/18/2014   Vitals:   02/01/16 1502  BP: (!) 164/88   BP on repeat check : 135/73, HR : 88   Assessment:  Diabetes: Most recent A1C was 8.3% which is above goal of less than 7%. CBG has improved as evidenced by reduction in 14 day average blood glucose. Recommend increasing metformin to 1000 mg BID, increasing prandial insulin and reducing basal insulin with long term goal of achieving more even 50-50 split between prandial and basal insulin. Recommend discussing plan with primary care provider to management blood glucose during sick days and while getting cortisone shots.   Hypertension: Suboptimally controlled as evidenced by original BP reading  this visit of 165/88 since reduction of blood pressure medication due to propensity for hydrochlorothiazide to increase uric acid. Recommend split of the combination medication and return patient to original valsartan dose of 320 mg daily. Alternatively, could switch to losartan as this ARB has urosuric action.   Lifestyle improvements:  checking blood sugar more frequently (up to two times/day consistently now)  Physical Activity- improved as evidenced by increased walking, however swimming has decreased  Nutrition- improvement in meal planning and day time snacking, less red meats and avoiding foods rich in purines per patient     Plan/Goals for Next Visit:   [facility] CM Care Plan Problem One   Flowsheet Row Most Recent Value  Care Plan Problem One  Knowledge deficit of diabetes management related to elevated A1C  Role Documenting the Problem One  Clinical Pharmacist  Care Plan for Problem One  Active  [facility] CM Short Term Goal #2 (0-30 days)  Patient will check blood glucose 2-3 times daily over the next 10 days before her primary care appointment as evidenced by patient report  [facility] CM Short Term Goal #2 Start Date  11/02/15  [facility] CM Short Term Goal #2 Met Date  02/01/16  Interventions for Short Term Goal #2  congratulated on accomplishments   [facility] CM Short Term Goal #3 (0-30 days)  Patient will plan meals for the next month this week as evidenced by patient report  [facility] CM Short Term Goal #3 Start Date  11/02/15  [facility] CM Short Term Goal #3 Met Date  02/01/16  Interventions for Short Tern Goal #3  congratulated to accomplishments    [facility] CM Care Plan Problem Two  Flowsheet Row Most Recent Value  Care Plan Problem Two  Obesity  Role Documenting the Problem Two  Clinical Pharmacist  Care Plan for Problem Two  Active  Interventions for Problem Two Long Term Goal   Discussed meal planning, healthy substitutions for common recipes, limiting sweets to 1 sweet/day  [facility] Long Term Goal (31-90) days   Patient will maintain weight over the holiday season as evidenced by no change in weight over the next 90 days  [facility] Long Term Goal Start Date  02/01/16      Next appointment to see me is: ?in 3 months, signed patient up for [place].    [name], Pharm.D. PGY1 Pharmacy Resident 11/20/20174:20 PM Pager [phone]

## 2016-02-02 DIAGNOSIS — M24572 Contracture, left ankle: Secondary | ICD-10-CM | POA: Diagnosis not present

## 2016-02-02 DIAGNOSIS — M24571 Contracture, right ankle: Secondary | ICD-10-CM | POA: Diagnosis not present

## 2016-02-02 DIAGNOSIS — M12271 Villonodular synovitis (pigmented), right ankle and foot: Secondary | ICD-10-CM | POA: Diagnosis not present

## 2016-02-02 DIAGNOSIS — M792 Neuralgia and neuritis, unspecified: Secondary | ICD-10-CM | POA: Diagnosis not present

## 2016-02-09 ENCOUNTER — Ambulatory Visit: Payer: 59 | Attending: Podiatry | Admitting: Physical Therapy

## 2016-02-09 DIAGNOSIS — M6281 Muscle weakness (generalized): Secondary | ICD-10-CM | POA: Diagnosis not present

## 2016-02-09 DIAGNOSIS — R262 Difficulty in walking, not elsewhere classified: Secondary | ICD-10-CM | POA: Diagnosis not present

## 2016-02-09 DIAGNOSIS — M25571 Pain in right ankle and joints of right foot: Secondary | ICD-10-CM | POA: Insufficient documentation

## 2016-02-09 DIAGNOSIS — M25572 Pain in left ankle and joints of left foot: Secondary | ICD-10-CM | POA: Diagnosis not present

## 2016-02-09 NOTE — Therapy (Signed)
[facility] [address], [address] [place], [place], [address] Phone: [phone]   Fax:  [phone]  Physical Therapy Evaluation  Patient Details  Name: [name] MRN: [id number] Date of Birth: 02/07/1951 Referring Provider: Dr. [name] PennaZiegler  Encounter Date: 02/09/2016      PT End of Session - 02/09/16 2001    Visit Number 1   Number of Visits 10   Date for PT Re-Evaluation 04/05/16   Authorization Type UMR (although patient has Part A Medicare) so will apply G codes just in case   PT Start Time 1530   PT Stop Time 1615   PT Time Calculation (min) 45 min   Activity Tolerance Patient tolerated treatment well      Past Medical History:  Diagnosis Date  . Anemia    hx of  . Arthritis   . Depression   . Diabetes mellitus ORAL MEDS  . GERD (gastroesophageal reflux disease)    occasional  . History of kidney stones   . Hyperlipidemia   . Hypertension   . Hypothyroidism   . Knee pain, right   . 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 OSA on CPAP   . Osteoarthritis   . Pinched nerve in neck   . Pneumonia    hx of  . PONV (postoperative nausea and vomiting)    for 3-4 days after general anesthesia  . Swelling of knee joint, right   . Synovitis of knee RIGHT    Past Surgical History:  Procedure Laterality Date  . CESAREAN SECTION  X3  . KNEE ARTHROSCOPY  04/20/2011   Procedure: ARTHROSCOPY KNEE;  Surgeon: Loanne DrillingFrank V Aluisio, MD;  [facility];  Service: Orthopedics;  Laterality: Right;  WITH SYNOVECTOMY  . LEFT CARPAL TUNNEL / LEFT MIDDLE & RING FINGER TRIGGER RELEASE  08-26-2008  . LEFT SHOULDER ARTHROSCOPY W/ DEBRIDEMENT  09-09-2003  . LEFT SHOULDER ARTHROSCOPY/ LEFT THUMB TRIGGER RELEASE  02-22-2005  . PULLEY RELEASE LEFT LONG FINGER  07-14-2009  . RIGHT CARPAL TUNNEL/ RIGHT THUMB TRIGGER RELEASE'S  11-28-2006  . RIGHT SHOULDER ARTHROSCOPY W/ ROTATOR CUFF REPAIR  01-13-2004   . SHOULDER ARTHROSCOPY DISTAL CLAVICLE EXCISION AND OPEN ROTATOR CUFF REPAIR  09-07-2004   LEFT  . SHOULDER ARTHROSCOPY W/ ACROMIAL REPAIR  11-29-2005   LEFT  . TONSILLECTOMY AND ADENOIDECTOMY  child  . TOTAL KNEE ARTHROPLASTY  12-14-2009   RIGHT  . TOTAL KNEE ARTHROPLASTY Left 11/05/2012   Procedure: LEFT TOTAL KNEE ARTHROPLASTY;  Surgeon: Loanne DrillingFrank V Aluisio, MD;  Location: [facility];  Service: Orthopedics;  Laterality: Left;  . TRIGGER FINGER RELEASE Right 02/21/2013   Procedure: RIGHT RING A-1 PULLEY RELEASE    (MINOR PROCEDURE) ;  Surgeon: Wyn Forsterobert V Sypher Jr., MD;  [facility] [place];  Service: Orthopedics;  Laterality: Right;    There were no vitals filed for this visit.       Subjective Assessment - 02/09/16 1537    Subjective Complains of bilateral foot/ankle pain right > left;  Left improved with injection, right 3 injections no help;  started after having gout 4-5 weeks ago;  bought 2 pairs of Birkenstocks.  Walking aggravates.  Better when off feet.  Right calf cramps since knee surgery.  Doing water ex still which does not aggravate.     Pertinent History gout in right foot 8 weeks ago;  Bilateral TKR;  HTN; DM;  hx of plantar fascitis and heel spurs on  right improved with PT;  takes 1 mg Aspirin   Limitations Walking;Standing   How long can you walk comfortably? 15-20 min   Diagnostic tests bunionette on right    Patient Stated Goals I would like for this pain to go away on the side of my feet.     Currently in Pain? Yes   Pain Score 3    Pain Location Foot   Pain Orientation Right   Pain Type Acute pain   Pain Frequency Constant   Aggravating Factors  walking, standing;  first thing in the AM   Pain Relieving Factors sitting, resting;  ice helps            [facility] PT Assessment - 02/09/16 0001      Assessment   Medical Diagnosis right and left lateral foot pain   Referring Provider Dr. [name] Penna   Onset Date/Surgical Date --  5 weeks   Hand Dominance  Right   Next MD Visit Mar 16 2016   Prior Therapy Had knee PT 6 months     Precautions   Precautions None     Restrictions   Weight Bearing Restrictions No     Balance Screen   Has the patient fallen in the past 6 months No   Has the patient had a decrease in activity level because of a fear of falling?  No   Is the patient reluctant to leave their home because of a fear of falling?  No     Home Environment   Living Environment Private residence   Living Arrangements Alone   Type of Home House   Home Access Stairs to enter   Entrance Stairs-Number of Steps 4   Home Layout One level     Prior Function   Level of Independence Independent   Vocation Full time employment   Leisure be able to walk more for fun      Observation/Other Assessments   Focus on Therapeutic Outcomes (FOTO)  45% limitation      Posture/Postural Control   Posture Comments moderate bruising right lateral and dorsal ankle from injection last week  arch height in WB WNLs     ROM / Strength   AROM / PROM / Strength AROM;Strength     AROM   AROM Assessment Site Ankle   Right/Left Ankle Right;Left   Right Ankle Dorsiflexion 2   Right Ankle Plantar Flexion 50   Right Ankle Inversion 38   Right Ankle Eversion 30   Left Ankle Dorsiflexion 1   Left Ankle Plantar Flexion 57   Left Ankle Inversion 50   Left Ankle Eversion 23     Strength   Strength Assessment Site Ankle   Right/Left Ankle Right;Left   Right Ankle Dorsiflexion 4-/5   Right Ankle Plantar Flexion 4-/5   Right Ankle Inversion 4-/5   Right Ankle Eversion 3+/5   Left Ankle Dorsiflexion 4/5   Left Ankle Plantar Flexion 4/5   Left Ankle Inversion 4/5   Left Ankle Eversion 4-/5     Palpation   Palpation comment Tender points in right gastroc, soleus and peroneals  decreased plantar fascia mobility on right     Special Tests   Ankle/Foot Special Tests  Anterior Drawer Test;Homan's Test;Great Toe Extension Test;Dorsiflexion-Eversion Test    Foot Alignment --  no great toe structural changes     Dorsiflexion-Eversion Test   Findings Negative  PT Education - 02/09/16 1958    Education provided Yes   Education Details ankle eversion with green band; isometric inversion   Person(s) Educated Patient   Methods Explanation;Handout;Demonstration   Comprehension Verbalized understanding;Returned demonstration          PT Short Term Goals - 02/09/16 2018      PT SHORT TERM GOAL #1   Title The patient will have basic knowledge of HEP to increase muscle length and initiate strengthening to correct muscle imbalances  03/08/16   Time 4   Period Weeks   Status New     PT SHORT TERM GOAL #2   Title Pain improved by 25% with walking and standing at  home and work   Time 4   Period Weeks   Status New     PT SHORT TERM GOAL #3   Title Improved bilateral gastroc muscle lengths allowing at least 4 degrees of ankle dorsiflexion bilaterally   Time 4   Period Weeks   Status New           PT Long Term Goals - 02/09/16 2020      PT LONG TERM GOAL #1   Title The patient will be independent in HEP and self management techniques   04/05/16   Time 8   Period Weeks   Status New     PT LONG TERM GOAL #2   Title The patient will have improved right ankle strength to 4/5 and left to 4+/5 needed for standing/walking longer periods of time    Time 8   Period Weeks   Status New     PT LONG TERM GOAL #3   Title The patient will be able to walk/stand for 25-30 min needed for work duties and grocery shopping   Time 8   Period Weeks   Status New     PT LONG TERM GOAL #4   Title The patient will report a 50% improvement in pain with home and work tasks    Time 8   Period Weeks   Status New     PT LONG TERM GOAL #5   Title FOTO functional outcome score improved from 45% to 31% indicating improved function with less pain   Time 8   Period Weeks   Status New                Plan - 02/09/16 2003    Clinical Impression Statement The patient is a 65 year old female with a 4-5 week history of bilateral (right > left) lateral ankle pain.  She denies any mechanism of injury although she did have a gout flare up in her right forefoot 8 weeks ago.  She reports her left ankle has improved since an injection but she states her right ankle has not improved after 3 injections.  She has a large bruise on the dorsal-lateral aspect of her right ankle from an injection 1 week ago.  Her past medical history is significant for right plantar fascitis and bilateral TKR with continued chronic knee pain on right.  She has tender points in right gastroc, soleus and peroneal muscles.  Decreased gastoc muscle lengths bilaterally.  Decreased right plantar fascia mobility;  Right ankle strength grossly 4-/5 except eversion 3+/5.  Left grossly 4/5.  Intrinsic strength 4/5.  She would benefit from PT to address these deficits.  The patient is of moderate complexity evaluation secondary to lack of home support, an evolving condition and numerous co-morbidities including multi-region  pain chronic pain, gout and diabetes which will affect the course of treatment.     Rehab Potential Good   Clinical Impairments Affecting Rehab Potential hx of plantar fascitis, gout, diabetes;  chronic knee pain following TKR   PT Frequency 2x / week   PT Duration 8 weeks   PT Treatment/Interventions ADLs/Self Care Home Management;Electrical Stimulation;Iontophoresis 4mg /ml Dexamethasone;Cryotherapy;Moist Heat;Ultrasound;Patient/family education;Therapeutic exercise;Manual techniques;Taping;Dry needling   PT Next Visit Plan Dry needling gastroc, soleus, and peroneals;  instrument assisted soft tissue manual therapy;  kinesiotaping; gastroc stretching; strengthening;  modalities;  ionto at a later time if indicated (just had 3 recent injections on right)      Patient will benefit from skilled therapeutic intervention in  order to improve the following deficits and impairments:  Decreased range of motion, Decreased strength, Hypomobility, Difficulty walking, Increased fascial restricitons, Increased muscle spasms, Impaired flexibility, Pain  Visit Diagnosis: Pain in right ankle and joints of right foot - Plan: PT plan of care cert/re-cert  Pain in left ankle and joints of left foot - Plan: PT plan of care cert/re-cert  Muscle weakness (generalized) - Plan: PT plan of care cert/re-cert  Difficulty in walking, not elsewhere classified - Plan: PT plan of care cert/re-cert      G-Codes - 02/21/2016 2021-08-01    Functional Assessment Tool Used FOTO; clinical judgement    Functional Limitation Mobility: Walking and moving around   Mobility: Walking and Moving Around Current Status [phone]) At least 40 percent but less than 60 percent impaired, limited or restricted   Mobility: Walking and Moving Around Goal Status [phone]) At least 20 percent but less than 40 percent impaired, limited or restricted       Problem List Patient Active Problem List   Diagnosis Date Noted  . Diabetes (HCC) 01/08/2014  . Essential hypertension 01/08/2014  . Hyperlipidemia 01/08/2014  . Postoperative anemia due to acute blood loss 11/22/2012  . Hyponatremia 11/06/2012  . OA (osteoarthritis) of knee 11/05/2012  . Villonodular synovitis of knee 04/20/2011   Lavinia Sharps, PT February 21, 2016 8:27 PM Phone: [phone] Fax: [phone]  Vivien Presto 02-21-2016, 8:26 PM  [facility] [address] [place], [place], [address] Phone: [phone]   Fax:  [phone]  Name: [name] MRN: [id number] Date of Birth: 08-Dec-1950

## 2016-02-09 NOTE — Patient Instructions (Signed)
Green band--pull feet apart  15x 1-2x/week   Isometric "toe kisses"  15x 1-2x/week    Lavinia SharpsStacy Simpson PT [facility] [address], [address] [place], [place] [address] Phone # [phone] Fax [phone]

## 2016-02-11 ENCOUNTER — Ambulatory Visit: Payer: 59 | Admitting: Physical Therapy

## 2016-02-11 ENCOUNTER — Encounter: Payer: Self-pay | Admitting: Physical Therapy

## 2016-02-11 DIAGNOSIS — M25572 Pain in left ankle and joints of left foot: Secondary | ICD-10-CM

## 2016-02-11 DIAGNOSIS — M6281 Muscle weakness (generalized): Secondary | ICD-10-CM | POA: Diagnosis not present

## 2016-02-11 DIAGNOSIS — M25571 Pain in right ankle and joints of right foot: Secondary | ICD-10-CM | POA: Diagnosis not present

## 2016-02-11 DIAGNOSIS — R262 Difficulty in walking, not elsewhere classified: Secondary | ICD-10-CM

## 2016-02-11 NOTE — Therapy (Signed)
[facility] [address], [address] [place], [place], [address] Phone: [phone]   Fax:  [phone]  Physical Therapy Treatment  Patient Details  Name: Stefanie DeemBonnie D Pinette MRN: [id number] Date of Birth: [birth year] Referring Provider: Dr. [name] PennaZiegler  Encounter Date: 02/11/2016      PT End of Session - 02/11/16 1535    Visit Number 2   Number of Visits 10   Date for PT Re-Evaluation 04/05/16   Authorization Type UMR (although patient has Part A Medicare) so will apply G codes just in case   PT Start Time 1530   PT Stop Time 1617   PT Time Calculation (min) 47 min   Activity Tolerance Patient tolerated treatment well      Past Medical History:  Diagnosis Date  . Anemia    hx of  . Arthritis   . Depression   . Diabetes mellitus ORAL MEDS  . GERD (gastroesophageal reflux disease)    occasional  . History of kidney stones   . Hyperlipidemia   . Hypertension   . Hypothyroidism   . Knee pain, right   . 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 OSA on CPAP   . Osteoarthritis   . Pinched nerve in neck   . Pneumonia    hx of  . PONV (postoperative nausea and vomiting)    for 3-4 days after general anesthesia  . Swelling of knee joint, right   . Synovitis of knee RIGHT    Past Surgical History:  Procedure Laterality Date  . CESAREAN SECTION  X3  . KNEE ARTHROSCOPY  04/20/2011   Procedure: ARTHROSCOPY KNEE;  Surgeon: Loanne DrillingFrank V Aluisio, MD;  [facility];  Service: Orthopedics;  Laterality: Right;  WITH SYNOVECTOMY  . LEFT CARPAL TUNNEL / LEFT MIDDLE & RING FINGER TRIGGER RELEASE  08-26-2008  . LEFT SHOULDER ARTHROSCOPY W/ DEBRIDEMENT  09-09-2003  . LEFT SHOULDER ARTHROSCOPY/ LEFT THUMB TRIGGER RELEASE  02-22-2005  . PULLEY RELEASE LEFT LONG FINGER  07-14-2009  . RIGHT CARPAL TUNNEL/ RIGHT THUMB TRIGGER RELEASE'S  11-28-2006  . RIGHT SHOULDER ARTHROSCOPY W/ ROTATOR CUFF REPAIR  01-13-2004  .  SHOULDER ARTHROSCOPY DISTAL CLAVICLE EXCISION AND OPEN ROTATOR CUFF REPAIR  09-07-2004   LEFT  . SHOULDER ARTHROSCOPY W/ ACROMIAL REPAIR  11-29-2005   LEFT  . TONSILLECTOMY AND ADENOIDECTOMY  child  . TOTAL KNEE ARTHROPLASTY  12-14-2009   RIGHT  . TOTAL KNEE ARTHROPLASTY Left 11/05/2012   Procedure: LEFT TOTAL KNEE ARTHROPLASTY;  Surgeon: Loanne DrillingFrank V Aluisio, MD;  Location: [facility];  Service: Orthopedics;  Laterality: Left;  . TRIGGER FINGER RELEASE Right 02/21/2013   Procedure: RIGHT RING A-1 PULLEY RELEASE    (MINOR PROCEDURE) ;  Surgeon: Wyn Forsterobert V Sypher Jr., MD;  [facility] [place];  Service: Orthopedics;  Laterality: Right;    There were no vitals filed for this visit.      Subjective Assessment - 02/11/16 1532    Subjective Pt reports ankel feeling well today. Has gotten a pair of Berkinstocks and has been trying to wear them.    Pertinent History gout in right foot 8 weeks ago;  Bilateral TKR;  HTN; DM;  hx of plantar fascitis and heel spurs on right improved with PT;  takes 1 mg Aspirin   Limitations Walking;Standing   How long can you walk comfortably? 15-20 min   Diagnostic tests bunionette on right    Patient Stated Goals I would like  for this pain to go away on the side of my feet.     Currently in Pain? Yes   Pain Score 2    Pain Location Foot   Pain Orientation Right   Pain Descriptors / Indicators Sharp   Pain Type Acute pain   Pain Frequency Constant                         [facility] Adult PT Treatment/Exercise - 02/11/16 0001      Exercises   Exercises Ankle     Manual Therapy   Manual Therapy Taping   Manual therapy comments Bil foot to facilitate pronation and dorsi flexion     Ankle Exercises: Aerobic   Stationary Bike Nustep L1 7 minutes  Therapist present to discuss treatment     Ankle Exercises: Seated   Other Seated Ankle Exercises Toe extension, abduction   Other Seated Ankle Exercises Foot roll over orange ball      Ankle Exercises: Supine   Isometrics 3 direction manaual resisted  Dorsi flexion, pronation, supination x10 5 second holds                  PT Short Term Goals - 02/09/16 2018      PT SHORT TERM GOAL #1   Title The patient will have basic knowledge of HEP to increase muscle length and initiate strengthening to correct muscle imbalances  03/08/16   Time 4   Period Weeks   Status New     PT SHORT TERM GOAL #2   Title Pain improved by 25% with walking and standing at  home and work   Time 4   Period Weeks   Status New     PT SHORT TERM GOAL #3   Title Improved bilateral gastroc muscle lengths allowing at least 4 degrees of ankle dorsiflexion bilaterally   Time 4   Period Weeks   Status New           PT Long Term Goals - 02/09/16 2020      PT LONG TERM GOAL #1   Title The patient will be independent in HEP and self management techniques   04/05/16   Time 8   Period Weeks   Status New     PT LONG TERM GOAL #2   Title The patient will have improved right ankle strength to 4/5 and left to 4+/5 needed for standing/walking longer periods of time    Time 8   Period Weeks   Status New     PT LONG TERM GOAL #3   Title The patient will be able to walk/stand for 25-30 min needed for work duties and grocery shopping   Time 8   Period Weeks   Status New     PT LONG TERM GOAL #4   Title The patient will report a 50% improvement in pain with home and work tasks    Time 8   Period Weeks   Status New     PT LONG TERM GOAL #5   Title FOTO functional outcome score improved from 45% to 31% indicating improved function with less pain   Time 8   Period Weeks   Status New               Plan - 02/11/16 1623    Clinical Impression Statement Pt presents with Bil ankle pain. Pt reports having very wide feet and not being able to ever find  shoes wide enough. Pt educated on Bunions and causes. Pt educated on foot structure and importance of proper fitting shoes. Pt  able to tolerate all strengthening and stretching exercises well. Kinesio tape applied to Bil feet to facilitate foot pronation and dorsi flexion. Pt advised to stretch feet and toes daily. Pt will continue to benefit from skilled therapy for Bil ankle and foot strengthening.    Rehab Potential Good   Clinical Impairments Affecting Rehab Potential hx of plantar fascitis, gout, diabetes;  chronic knee pain following TKR   PT Frequency 2x / week   PT Duration 8 weeks   PT Treatment/Interventions ADLs/Self Care Home Management;Electrical Stimulation;Iontophoresis 4mg /ml Dexamethasone;Cryotherapy;Moist Heat;Ultrasound;Patient/family education;Therapeutic exercise;Manual techniques;Taping;Dry needling   PT Next Visit Plan Kinesiotaping; gastroc stretching; strengthening;  modalities;  ionto at a later time if indicated (just had 3 recent injections on right). Foot stretching and strengthening   Consulted and Agree with Plan of Care Patient      Patient will benefit from skilled therapeutic intervention in order to improve the following deficits and impairments:  Decreased range of motion, Decreased strength, Hypomobility, Difficulty walking, Increased fascial restricitons, Increased muscle spasms, Impaired flexibility, Pain  Visit Diagnosis: Pain in right ankle and joints of right foot  Pain in left ankle and joints of left foot  Muscle weakness (generalized)  Difficulty in walking, not elsewhere classified     Problem List Patient Active Problem List   Diagnosis Date Noted  . Diabetes (HCC) 01/08/2014  . Essential hypertension 01/08/2014  . Hyperlipidemia 01/08/2014  . Postoperative anemia due to acute blood loss 11/22/2012  . Hyponatremia 11/06/2012  . OA (osteoarthritis) of knee 11/05/2012  . 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 [place], [place], [address] Phone: [phone]   Fax:  [phone]  Name: CHALLIS CRILL MRN: [id number] Date of Birth: 02-16-1951

## 2016-02-16 ENCOUNTER — Ambulatory Visit: Payer: 59 | Attending: Podiatry | Admitting: Physical Therapy

## 2016-02-16 DIAGNOSIS — M25571 Pain in right ankle and joints of right foot: Secondary | ICD-10-CM | POA: Insufficient documentation

## 2016-02-16 DIAGNOSIS — M25572 Pain in left ankle and joints of left foot: Secondary | ICD-10-CM | POA: Insufficient documentation

## 2016-02-16 DIAGNOSIS — M6281 Muscle weakness (generalized): Secondary | ICD-10-CM | POA: Insufficient documentation

## 2016-02-16 DIAGNOSIS — R262 Difficulty in walking, not elsewhere classified: Secondary | ICD-10-CM | POA: Insufficient documentation

## 2016-02-16 DIAGNOSIS — Z5181 Encounter for therapeutic drug level monitoring: Secondary | ICD-10-CM | POA: Diagnosis not present

## 2016-02-16 NOTE — Therapy (Signed)
[facility] [address], [address] [place], [place], [address] Phone: [phone]   Fax:  [phone]  Physical Therapy Treatment  Patient Details  Name: [name] MRN: [id number] Date of Birth: Nov 05, 1950 Referring Provider: Dr. [name] PennaZiegler  Encounter Date: 02/16/2016      PT End of Session - 02/16/16 1704    Visit Number 3   Number of Visits 10   Date for PT Re-Evaluation 04/05/16   Authorization Type UMR (although patient has Part A Medicare) so will apply G codes just in case   PT Start Time 1613   PT Stop Time 1709   PT Time Calculation (min) 56 min   Activity Tolerance Patient tolerated treatment well      Past Medical History:  Diagnosis Date  . Anemia    hx of  . Arthritis   . Depression   . Diabetes mellitus ORAL MEDS  . GERD (gastroesophageal reflux disease)    occasional  . History of kidney stones   . Hyperlipidemia   . Hypertension   . Hypothyroidism   . Knee pain, right   . 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 OSA on CPAP   . Osteoarthritis   . Pinched nerve in neck   . Pneumonia    hx of  . PONV (postoperative nausea and vomiting)    for 3-4 days after general anesthesia  . Swelling of knee joint, right   . Synovitis of knee RIGHT    Past Surgical History:  Procedure Laterality Date  . CESAREAN SECTION  X3  . KNEE ARTHROSCOPY  04/20/2011   Procedure: ARTHROSCOPY KNEE;  Surgeon: Loanne DrillingFrank V Aluisio, MD;  [facility] [place];  Service: Orthopedics;  Laterality: Right;  WITH SYNOVECTOMY  . LEFT CARPAL TUNNEL / LEFT MIDDLE & RING FINGER TRIGGER RELEASE  08-26-2008  . LEFT SHOULDER ARTHROSCOPY W/ DEBRIDEMENT  09-09-2003  . LEFT SHOULDER ARTHROSCOPY/ LEFT THUMB TRIGGER RELEASE  02-22-2005  . PULLEY RELEASE LEFT LONG FINGER  07-14-2009  . RIGHT CARPAL TUNNEL/ RIGHT THUMB TRIGGER RELEASE'S  11-28-2006  . RIGHT SHOULDER ARTHROSCOPY W/ ROTATOR CUFF REPAIR  01-13-2004  .  SHOULDER ARTHROSCOPY DISTAL CLAVICLE EXCISION AND OPEN ROTATOR CUFF REPAIR  09-07-2004   LEFT  . SHOULDER ARTHROSCOPY W/ ACROMIAL REPAIR  11-29-2005   LEFT  . TONSILLECTOMY AND ADENOIDECTOMY  child  . TOTAL KNEE ARTHROPLASTY  12-14-2009   RIGHT  . TOTAL KNEE ARTHROPLASTY Left 11/05/2012   Procedure: LEFT TOTAL KNEE ARTHROPLASTY;  Surgeon: Loanne DrillingFrank V Aluisio, MD;  Location: [facility];  Service: Orthopedics;  Laterality: Left;  . TRIGGER FINGER RELEASE Right 02/21/2013   Procedure: RIGHT RING A-1 PULLEY RELEASE    (MINOR PROCEDURE) ;  Surgeon: Wyn Forsterobert V Sypher Jr., MD;  [facility] [place];  Service: Orthopedics;  Laterality: Right;    There were no vitals filed for this visit.      Subjective Assessment - 02/16/16 1610    Subjective Wearing Birkenstocks today.  Taped foot but it came off during the night but helped while it was on.  Pain earlier today 4/10 but better now.  The (dorsal/lateral) foot feels bruised.     Currently in Pain? Yes   Pain Score 3    Pain Location Ankle   Pain Orientation Right   Pain Frequency Constant  [facility] Adult PT Treatment/Exercise - 02/16/16 0001      Moist Heat Therapy   Number Minutes Moist Heat 10 Minutes   Moist Heat Location --  right calf      Manual Therapy   Manual Therapy Soft tissue mobilization;Myofascial release;Passive ROM   Soft tissue mobilization posterior tibialis, gastroc/soleus   Myofascial Release Graston instrument assisted to gastroc, soleus, plantar fascia   Passive ROM ankle DF and toe extension 3x 30 sec each     Ankle Exercises: Seated   Other Seated Ankle Exercises dome arch lifts 10x   Other Seated Ankle Exercises review toe abduction ex     Ankle Exercises: Standing   Other Standing Ankle Exercises dome arch lifts in standing 10x   Other Standing Ankle Exercises glut squeezes 10x; with dome lifts and glut squeezes 10x          Trigger Point Dry Needling -  02/16/16 1721    Consent Given? Yes   Education Handout Provided No  patient has had Dry needling in the past   Muscles Treated Lower Body Gastrocnemius;Soleus  posterior tibilias right only   Gastrocnemius Response Twitch response elicited;Palpable increased muscle length   Soleus Response Twitch response elicited;Palpable increased muscle length       Right side only for dry needling (DN)         PT Short Term Goals - 02/16/16 1719      PT SHORT TERM GOAL #1   Title The patient will have basic knowledge of HEP to increase muscle length and initiate strengthening to correct muscle imbalances  03/08/16   Time 4   Period Weeks   Status On-going     PT SHORT TERM GOAL #2   Title Pain improved by 25% with walking and standing at  home and work   Time 4   Period Weeks   Status On-going     PT SHORT TERM GOAL #3   Title Improved bilateral gastroc muscle lengths allowing at least 4 degrees of ankle dorsiflexion bilaterally   Time 4   Period Weeks   Status On-going           PT Long Term Goals - 02/16/16 1719      PT LONG TERM GOAL #1   Title The patient will be independent in HEP and self management techniques   04/05/16   Time 8   Period Weeks   Status On-going     PT LONG TERM GOAL #2   Title The patient will have improved right ankle strength to 4/5 and left to 4+/5 needed for standing/walking longer periods of time    Time 8   Period Weeks   Status On-going     PT LONG TERM GOAL #3   Title The patient will be able to walk/stand for 25-30 min needed for work duties and grocery shopping   Time 8   Period Weeks   Status On-going     PT LONG TERM GOAL #4   Title The patient will report a 50% improvement in pain with home and work tasks    Time 8   Period Weeks     PT LONG TERM GOAL #5   Title FOTO functional outcome score improved from 45% to 31% indicating improved function with less pain   Time 8   Period Weeks   Status On-going                Plan - 02/16/16 1705  Clinical Impression Statement The patient has multiple tender points in posterior tibialis, gastroc and soleus muscles.  Improved muscle lengths following manual therapy and dry needling.  With verbal cueing she is able to activate deep intrinsic muscles with more difficulty on right vs. left.  Therapist is closely monitoring response with all interventions.     PT Next Visit Plan assess response to DN#1 and Graston; intrinsic foot exercises and progress to sit to stand with arch "dome" ex;  resume kinesiotaping;  ionto at a later time       Patient will benefit from skilled therapeutic intervention in order to improve the following deficits and impairments:     Visit Diagnosis: Pain in right ankle and joints of right foot  Muscle weakness (generalized)  Difficulty in walking, not elsewhere classified     Problem List Patient Active Problem List   Diagnosis Date Noted  . Diabetes (HCC) 01/08/2014  . Essential hypertension 01/08/2014  . Hyperlipidemia 01/08/2014  . Postoperative anemia due to acute blood loss 11/22/2012  . Hyponatremia 11/06/2012  . OA (osteoarthritis) of knee 11/05/2012  . 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 [place], [place], [address] Phone: [phone]   Fax:  [phone]  Name: [name] MRN: [id number] Date of Birth: 1950/07/16

## 2016-02-16 NOTE — Therapy (Signed)
[facility] [address], [address] [place], [place], [address] Phone: [phone]   Fax:  [phone]  Physical Therapy Treatment  Patient Details  Name: [name] MRN: [id number] Date of Birth: Nov 05, 1950 Referring Provider: Dr. [name] PennaZiegler  Encounter Date: 02/16/2016      PT End of Session - 02/16/16 1704    Visit Number 3   Number of Visits 10   Date for PT Re-Evaluation 04/05/16   Authorization Type UMR (although patient has Part A Medicare) so will apply G codes just in case   PT Start Time 1613   PT Stop Time 1709   PT Time Calculation (min) 56 min   Activity Tolerance Patient tolerated treatment well      Past Medical History:  Diagnosis Date  . Anemia    hx of  . Arthritis   . Depression   . Diabetes mellitus ORAL MEDS  . GERD (gastroesophageal reflux disease)    occasional  . History of kidney stones   . Hyperlipidemia   . Hypertension   . Hypothyroidism   . Knee pain, right   . 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 OSA on CPAP   . Osteoarthritis   . Pinched nerve in neck   . Pneumonia    hx of  . PONV (postoperative nausea and vomiting)    for 3-4 days after general anesthesia  . Swelling of knee joint, right   . Synovitis of knee RIGHT    Past Surgical History:  Procedure Laterality Date  . CESAREAN SECTION  X3  . KNEE ARTHROSCOPY  04/20/2011   Procedure: ARTHROSCOPY KNEE;  Surgeon: Loanne DrillingFrank V Aluisio, MD;  [facility] [place];  Service: Orthopedics;  Laterality: Right;  WITH SYNOVECTOMY  . LEFT CARPAL TUNNEL / LEFT MIDDLE & RING FINGER TRIGGER RELEASE  08-26-2008  . LEFT SHOULDER ARTHROSCOPY W/ DEBRIDEMENT  09-09-2003  . LEFT SHOULDER ARTHROSCOPY/ LEFT THUMB TRIGGER RELEASE  02-22-2005  . PULLEY RELEASE LEFT LONG FINGER  07-14-2009  . RIGHT CARPAL TUNNEL/ RIGHT THUMB TRIGGER RELEASE'S  11-28-2006  . RIGHT SHOULDER ARTHROSCOPY W/ ROTATOR CUFF REPAIR  01-13-2004  .  SHOULDER ARTHROSCOPY DISTAL CLAVICLE EXCISION AND OPEN ROTATOR CUFF REPAIR  09-07-2004   LEFT  . SHOULDER ARTHROSCOPY W/ ACROMIAL REPAIR  11-29-2005   LEFT  . TONSILLECTOMY AND ADENOIDECTOMY  child  . TOTAL KNEE ARTHROPLASTY  12-14-2009   RIGHT  . TOTAL KNEE ARTHROPLASTY Left 11/05/2012   Procedure: LEFT TOTAL KNEE ARTHROPLASTY;  Surgeon: Loanne DrillingFrank V Aluisio, MD;  Location: [facility];  Service: Orthopedics;  Laterality: Left;  . TRIGGER FINGER RELEASE Right 02/21/2013   Procedure: RIGHT RING A-1 PULLEY RELEASE    (MINOR PROCEDURE) ;  Surgeon: Wyn Forsterobert V Sypher Jr., MD;  [facility] [place];  Service: Orthopedics;  Laterality: Right;    There were no vitals filed for this visit.      Subjective Assessment - 02/16/16 1610    Subjective Wearing Birkenstocks today.  Taped foot but it came off during the night but helped while it was on.  Pain earlier today 4/10 but better now.  The (dorsal/lateral) foot feels bruised.     Currently in Pain? Yes   Pain Score 3    Pain Location Ankle   Pain Orientation Right   Pain Frequency Constant  [facility] Adult PT Treatment/Exercise - 02/16/16 0001      Moist Heat Therapy   Number Minutes Moist Heat 10 Minutes   Moist Heat Location --  right calf      Manual Therapy   Manual Therapy Soft tissue mobilization;Myofascial release;Passive ROM   Soft tissue mobilization posterior tibialis, gastroc/soleus   Myofascial Release Graston instrument assisted to gastroc, soleus, plantar fascia   Passive ROM ankle DF and toe extension 3x 30 sec each     Ankle Exercises: Seated   Other Seated Ankle Exercises dome arch lifts 10x   Other Seated Ankle Exercises review toe abduction ex     Ankle Exercises: Standing   Other Standing Ankle Exercises dome arch lifts in standing 10x   Other Standing Ankle Exercises glut squeezes 10x; with dome lifts and glut squeezes 10x                  PT Short Term  Goals - 02/16/16 1719      PT SHORT TERM GOAL #1   Title The patient will have basic knowledge of HEP to increase muscle length and initiate strengthening to correct muscle imbalances  03/08/16   Time 4   Period Weeks   Status On-going     PT SHORT TERM GOAL #2   Title Pain improved by 25% with walking and standing at  home and work   Time 4   Period Weeks   Status On-going     PT SHORT TERM GOAL #3   Title Improved bilateral gastroc muscle lengths allowing at least 4 degrees of ankle dorsiflexion bilaterally   Time 4   Period Weeks   Status On-going           PT Long Term Goals - 02/16/16 1719      PT LONG TERM GOAL #1   Title The patient will be independent in HEP and self management techniques   04/05/16   Time 8   Period Weeks   Status On-going     PT LONG TERM GOAL #2   Title The patient will have improved right ankle strength to 4/5 and left to 4+/5 needed for standing/walking longer periods of time    Time 8   Period Weeks   Status On-going     PT LONG TERM GOAL #3   Title The patient will be able to walk/stand for 25-30 min needed for work duties and grocery shopping   Time 8   Period Weeks   Status On-going     PT LONG TERM GOAL #4   Title The patient will report a 50% improvement in pain with home and work tasks    Time 8   Period Weeks     PT LONG TERM GOAL #5   Title FOTO functional outcome score improved from 45% to 31% indicating improved function with less pain   Time 8   Period Weeks   Status On-going               Plan - 02/16/16 1705    Clinical Impression Statement The patient has multiple tender points in posterior tibialis, gastroc and soleus muscles.  Improved muscle lengths following manual therapy and dry needling.  With verbal cueing she is able to activate deep intrinsic muscles with more difficulty on right vs. left.  Therapist is closely monitoring response with all interventions.     PT Next Visit Plan assess response to  DN#1 and Graston; intrinsic foot exercises and  progress to sit to stand with arch "dome" ex;  resume kinesiotaping;  ionto at a later time       Patient will benefit from skilled therapeutic intervention in order to improve the following deficits and impairments:     Visit Diagnosis: Pain in right ankle and joints of right foot  Muscle weakness (generalized)  Difficulty in walking, not elsewhere classified     Problem List Patient Active Problem List   Diagnosis Date Noted  . Diabetes (HCC) 01/08/2014  . Essential hypertension 01/08/2014  . Hyperlipidemia 01/08/2014  . Postoperative anemia due to acute blood loss 11/22/2012  . Hyponatremia 11/06/2012  . OA (osteoarthritis) of knee 11/05/2012  . 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, [address] [place], [place], [address] Phone: [phone]   Fax:  [phone]  Name: [name] MRN: [id number] Date of Birth: March 24, 1950

## 2016-02-18 ENCOUNTER — Ambulatory Visit: Payer: 59 | Admitting: Physical Therapy

## 2016-02-18 DIAGNOSIS — M6281 Muscle weakness (generalized): Secondary | ICD-10-CM

## 2016-02-18 DIAGNOSIS — R262 Difficulty in walking, not elsewhere classified: Secondary | ICD-10-CM | POA: Diagnosis not present

## 2016-02-18 DIAGNOSIS — M25572 Pain in left ankle and joints of left foot: Secondary | ICD-10-CM | POA: Diagnosis not present

## 2016-02-18 DIAGNOSIS — Z5181 Encounter for therapeutic drug level monitoring: Secondary | ICD-10-CM | POA: Diagnosis not present

## 2016-02-18 DIAGNOSIS — M25571 Pain in right ankle and joints of right foot: Secondary | ICD-10-CM | POA: Diagnosis not present

## 2016-02-18 NOTE — Therapy (Signed)
[facility] [address], [address] [place], [place], [address] Phone: [phone]   Fax:  [phone]  Physical Therapy Treatment  Patient Details  Name: [name] MRN: [id number] Date of Birth: 25-Jun-1950 Referring Provider: Dr. [name] PennaZiegler  Encounter Date: 02/18/2016      PT End of Session - 02/18/16 1714    Visit Number 4   Number of Visits 10   Date for PT Re-Evaluation 04/05/16   Authorization Type UMR (although patient has Part A Medicare) so will apply G codes just in case   PT Start Time 1530   PT Stop Time 1625   PT Time Calculation (min) 55 min   Activity Tolerance Patient tolerated treatment well      Past Medical History:  Diagnosis Date  . Anemia    hx of  . Arthritis   . Depression   . Diabetes mellitus ORAL MEDS  . GERD (gastroesophageal reflux disease)    occasional  . History of kidney stones   . Hyperlipidemia   . Hypertension   . Hypothyroidism   . Knee pain, right   . 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 OSA on CPAP   . Osteoarthritis   . Pinched nerve in neck   . Pneumonia    hx of  . PONV (postoperative nausea and vomiting)    for 3-4 days after general anesthesia  . Swelling of knee joint, right   . Synovitis of knee RIGHT    Past Surgical History:  Procedure Laterality Date  . CESAREAN SECTION  X3  . KNEE ARTHROSCOPY  04/20/2011   Procedure: ARTHROSCOPY KNEE;  Surgeon: Loanne DrillingFrank V Aluisio, MD;  [facility] [place];  Service: Orthopedics;  Laterality: Right;  WITH SYNOVECTOMY  . LEFT CARPAL TUNNEL / LEFT MIDDLE & RING FINGER TRIGGER RELEASE  08-26-2008  . LEFT SHOULDER ARTHROSCOPY W/ DEBRIDEMENT  09-09-2003  . LEFT SHOULDER ARTHROSCOPY/ LEFT THUMB TRIGGER RELEASE  02-22-2005  . PULLEY RELEASE LEFT LONG FINGER  07-14-2009  . RIGHT CARPAL TUNNEL/ RIGHT THUMB TRIGGER RELEASE'S  11-28-2006  . RIGHT SHOULDER ARTHROSCOPY W/ ROTATOR CUFF REPAIR  01-13-2004  .  SHOULDER ARTHROSCOPY DISTAL CLAVICLE EXCISION AND OPEN ROTATOR CUFF REPAIR  09-07-2004   LEFT  . SHOULDER ARTHROSCOPY W/ ACROMIAL REPAIR  11-29-2005   LEFT  . TONSILLECTOMY AND ADENOIDECTOMY  child  . TOTAL KNEE ARTHROPLASTY  12-14-2009   RIGHT  . TOTAL KNEE ARTHROPLASTY Left 11/05/2012   Procedure: LEFT TOTAL KNEE ARTHROPLASTY;  Surgeon: Loanne DrillingFrank V Aluisio, MD;  Location: [facility];  Service: Orthopedics;  Laterality: Left;  . TRIGGER FINGER RELEASE Right 02/21/2013   Procedure: RIGHT RING A-1 PULLEY RELEASE    (MINOR PROCEDURE) ;  Surgeon: Wyn Forsterobert V Sypher Jr., MD;  [facility];  Service: Orthopedics;  Laterality: Right;    There were no vitals filed for this visit.      Subjective Assessment - 02/18/16 1531    Subjective continues to wear Birkenstocks.  Reports soreness after last session. Going to be working at [employer] a lot the next 2 weeks.     Currently in Pain? Yes   Pain Score 3    Pain Location Ankle   Pain Orientation Right;Left   Pain Type Chronic pain                         [facility] Adult PT Treatment/Exercise - 02/18/16 0001  Moist Heat Therapy   Number Minutes Moist Heat 10 Minutes   Moist Heat Location --  bilateral lower legs     Manual Therapy   Soft tissue mobilization posterior tibialis, gastroc/soleus   Myofascial Release Graston instrument assisted to gastroc, soleus, plantar fascia   Passive ROM ankle DF and toe extension 3x 30 sec each     Ankle Exercises: Seated   Other Seated Ankle Exercises dome arch lifts 10x     Ankle Exercises: Standing   SLS forward and back weight shift with dome lift and glut squeeze 10 x each   Other Standing Ankle Exercises dome arch lifts in standing 10x   Other Standing Ankle Exercises glut squeezes 10x; with dome lifts and glut squeezes 10x                  PT Short Term Goals - 02/18/16 1719      PT SHORT TERM GOAL #1   Title The patient will have basic  knowledge of HEP to increase muscle length and initiate strengthening to correct muscle imbalances  03/08/16   Time 4   Period Weeks   Status On-going     PT SHORT TERM GOAL #2   Title Pain improved by 25% with walking and standing at  home and work   Time 4   Period Weeks   Status On-going     PT SHORT TERM GOAL #3   Title Improved bilateral gastroc muscle lengths allowing at least 4 degrees of ankle dorsiflexion bilaterally   Time 4   Period Weeks   Status On-going           PT Long Term Goals - 02/18/16 1719      PT LONG TERM GOAL #1   Title The patient will be independent in HEP and self management techniques   04/05/16   Time 8   Period Weeks   Status On-going     PT LONG TERM GOAL #2   Title The patient will have improved right ankle strength to 4/5 and left to 4+/5 needed for standing/walking longer periods of time    Time 8   Period Weeks   Status On-going     PT LONG TERM GOAL #3   Title The patient will be able to walk/stand for 25-30 min needed for work duties and grocery shopping   Time 8   Period Weeks   Status On-going     PT LONG TERM GOAL #4   Title The patient will report a 50% improvement in pain with home and work tasks    Time 8   Period Weeks   Status On-going     PT LONG TERM GOAL #5   Title FOTO functional outcome score improved from 45% to 31% indicating improved function with less pain   Time 8   Period Weeks   Status On-going               Plan - 02/18/16 1714    Clinical Impression Statement The patient is improving with toe intrinsic "core" strengthening in weight bearing.  Improved toe abduction noted.  Tender points in peroneals bilaterally.  Decreased gastroc muscle length on right noted in standing which may be causing overuse of lateral ankle and forefoot musculature.   Improved muscle length following instrument assisted soft tissue.  Therapist closely monitoring response with all treatment interventions.     PT Next  Visit Plan assess response to Graston; dry needling peroneals, posterior  tibialis, gastroc right > left;  intrinsic foot exercises;gastroc stretching;  resume kinesiotaping;  ionto in 1-2 weeks as needed (just had injection recently)      Patient will benefit from skilled therapeutic intervention in order to improve the following deficits and impairments:     Visit Diagnosis: Pain in right ankle and joints of right foot  Muscle weakness (generalized)  Difficulty in walking, not elsewhere classified  Pain in left ankle and joints of left foot     Problem List Patient Active Problem List   Diagnosis Date Noted  . Diabetes (HCC) 01/08/2014  . Essential hypertension 01/08/2014  . Hyperlipidemia 01/08/2014  . Postoperative anemia due to acute blood loss 11/22/2012  . Hyponatremia 11/06/2012  . OA (osteoarthritis) of knee 11/05/2012  . Villonodular synovitis of knee 04/20/2011   Lavinia SharpsStacy Simpson, PT 02/18/16 5:21 PM Phone: [phone] Fax: [phone] Vivien PrestoSimpson, Stacy C 02/18/2016, 5:21 PM  [facility] [address] [place], [place], [address] Phone: [phone]   Fax:  [phone]  Name: [name] MRN: [id number] Date of Birth: November 14, 1950

## 2016-02-22 ENCOUNTER — Ambulatory Visit: Payer: 59

## 2016-02-22 DIAGNOSIS — R262 Difficulty in walking, not elsewhere classified: Secondary | ICD-10-CM

## 2016-02-22 DIAGNOSIS — M6281 Muscle weakness (generalized): Secondary | ICD-10-CM

## 2016-02-22 DIAGNOSIS — M25572 Pain in left ankle and joints of left foot: Secondary | ICD-10-CM

## 2016-02-22 DIAGNOSIS — Z5181 Encounter for therapeutic drug level monitoring: Secondary | ICD-10-CM | POA: Diagnosis not present

## 2016-02-22 DIAGNOSIS — M25571 Pain in right ankle and joints of right foot: Secondary | ICD-10-CM | POA: Diagnosis not present

## 2016-02-22 NOTE — Therapy (Signed)
[facility] [address], [address], [place], [address] Phone: [phone]   Fax:  [phone]  Physical Therapy Treatment  Patient Details  Name: [name] MRN: [id number] Date of Birth: [birth year] Referring Provider: Dr. [name] Penna  Encounter Date: 02/22/2016      PT End of Session - 02/22/16 1609    Visit Number 5   Number of Visits 10   Date for PT Re-Evaluation 04/05/16   Authorization Type UMR (although patient has Part A Medicare) so will apply G codes just in case   PT Start Time 1532   PT Stop Time 1619  dry needling   PT Time Calculation (min) 47 min      Past Medical History:  Diagnosis Date  . Anemia    hx of  . Arthritis   . Depression   . Diabetes mellitus ORAL MEDS  . GERD (gastroesophageal reflux disease)    occasional  . History of kidney stones   . Hyperlipidemia   . Hypertension   . Hypothyroidism   . Knee pain, right   . 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 OSA on CPAP   . Osteoarthritis   . Pinched nerve in neck   . Pneumonia    hx of  . PONV (postoperative nausea and vomiting)    for 3-4 days after general anesthesia  . Swelling of knee joint, right   . Synovitis of knee RIGHT    Past Surgical History:  Procedure Laterality Date  . CESAREAN SECTION  X3  . KNEE ARTHROSCOPY  04/20/2011   Procedure: ARTHROSCOPY KNEE;  Surgeon: Loanne Drilling, MD;  Location: [facility];  Service: Orthopedics;  Laterality: Right;  WITH SYNOVECTOMY  . LEFT CARPAL TUNNEL / LEFT MIDDLE & RING FINGER TRIGGER RELEASE  08-26-2008  . LEFT SHOULDER ARTHROSCOPY W/ DEBRIDEMENT  09-09-2003  . LEFT SHOULDER ARTHROSCOPY/ LEFT THUMB TRIGGER RELEASE  02-22-2005  . PULLEY RELEASE LEFT LONG FINGER  07-14-2009  . RIGHT CARPAL TUNNEL/ RIGHT THUMB TRIGGER RELEASE'S  11-28-2006  . RIGHT SHOULDER ARTHROSCOPY W/ ROTATOR CUFF REPAIR  01-13-2004  . SHOULDER ARTHROSCOPY DISTAL CLAVICLE  EXCISION AND OPEN ROTATOR CUFF REPAIR  09-07-2004   LEFT  . SHOULDER ARTHROSCOPY W/ ACROMIAL REPAIR  11-29-2005   LEFT  . TONSILLECTOMY AND ADENOIDECTOMY  child  . TOTAL KNEE ARTHROPLASTY  12-14-2009   RIGHT  . TOTAL KNEE ARTHROPLASTY Left 11/05/2012   Procedure: LEFT TOTAL KNEE ARTHROPLASTY;  Surgeon: Loanne Drilling, MD;  Location: [facility];  Service: Orthopedics;  Laterality: Left;  . TRIGGER FINGER RELEASE Right 02/21/2013   Procedure: RIGHT RING A-1 PULLEY RELEASE    (MINOR PROCEDURE) ;  Surgeon: Wyn Forster., MD;  Location: [facility];  Service: Orthopedics;  Laterality: Right;    There were no vitals filed for this visit.      Subjective Assessment - 02/22/16 1536    Subjective Dry needling was helpful on the Rt last week.     Pertinent History gout in right foot 8 weeks ago;  Bilateral TKR;  HTN; DM;  hx of plantar fascitis and heel spurs on right improved with PT;  takes 1 mg Aspirin   Patient Stated Goals I would like for this pain to go away on the side of my feet.     Currently in Pain? Yes   Pain Score 3    Pain Location Ankle   Pain Orientation Right;Left  Pain Descriptors / Indicators Sharp   Pain Type Chronic pain   Pain Onset More than a month ago   Pain Frequency Constant   Aggravating Factors  walking, standing, first thing in the morning   Pain Relieving Factors sitting, rest, ice                         [facility] Adult PT Treatment/Exercise - 02/22/16 0001      Moist Heat Therapy   Number Minutes Moist Heat 10 Minutes   Moist Heat Location --  Rt gastroc     Manual Therapy   Manual Therapy Soft tissue mobilization;Myofascial release;Passive ROM   Soft tissue mobilization posterior tibialis, gastroc/soleus   Passive ROM ankle DF and toe extension 3x 30 sec each     Ankle Exercises: Standing   Rocker Board 3 minutes   Rebounder 3 ways x 1 minute each     Ankle Exercises: Aerobic   Stationary Bike --  Nu step: Level  1x 6 minutes          Trigger Point Dry Needling - 02/22/16 1548    Consent Given? Yes   Muscles Treated Lower Body Gastrocnemius;Soleus  tibialis posterior, Rt only   Gastrocnemius Response Twitch response elicited;Palpable increased muscle length   Soleus Response Twitch response elicited;Palpable increased muscle length                PT Short Term Goals - 02/22/16 1540      PT SHORT TERM GOAL #1   Title The patient will have basic knowledge of HEP to increase muscle length and initiate strengthening to correct muscle imbalances  03/08/16   Status Achieved     PT SHORT TERM GOAL #2   Title Pain improved by 25% with walking and standing at  home and work   Time 4   Period Weeks   Status On-going           PT Long Term Goals - 02/18/16 1719      PT LONG TERM GOAL #1   Title The patient will be independent in HEP and self management techniques   04/05/16   Time 8   Period Weeks   Status On-going     PT LONG TERM GOAL #2   Title The patient will have improved right ankle strength to 4/5 and left to 4+/5 needed for standing/walking longer periods of time    Time 8   Period Weeks   Status On-going     PT LONG TERM GOAL #3   Title The patient will be able to walk/stand for 25-30 min needed for work duties and grocery shopping   Time 8   Period Weeks   Status On-going     PT LONG TERM GOAL #4   Title The patient will report a 50% improvement in pain with home and work tasks    Time 8   Period Weeks   Status On-going     PT LONG TERM GOAL #5   Title FOTO functional outcome score improved from 45% to 31% indicating improved function with less pain   Time 8   Period Weeks   Status On-going               Plan - 02/22/16 1542    Clinical Impression Statement Pt reports 50-60% overall improvement in symptoms since the start of care.  Improved toe abduction noted.  Pt demonstrates tenderness in Rt>Lt peroneals and  decreased gastroc muscle length on  the Rt.  Pt demonstrated improved tissue mobility after dry needling today.  Pt will continue to benefit from skilled PT for endurance, strength and flexiblity.     Rehab Potential Good   Clinical Impairments Affecting Rehab Potential hx of plantar fascitis, gout, diabetes;  chronic knee pain following TKR   PT Frequency 2x / week   PT Duration 8 weeks   PT Treatment/Interventions ADLs/Self Care Home Management;Electrical Stimulation;Iontophoresis 4mg /ml Dexamethasone;Cryotherapy;Moist Heat;Ultrasound;Patient/family education;Therapeutic exercise;Manual techniques;Taping;Dry needling   PT Next Visit Plan assess response to dry needling to Rt peroneals today.  Gastroc strength, endurance, flexiblity, intrinsic strength. manual.   Consulted and Agree with Plan of Care Patient      Patient will benefit from skilled therapeutic intervention in order to improve the following deficits and impairments:  Decreased range of motion, Decreased strength, Hypomobility, Difficulty walking, Increased fascial restricitons, Increased muscle spasms, Impaired flexibility, Pain  Visit Diagnosis: Pain in right ankle and joints of right foot  Muscle weakness (generalized)  Difficulty in walking, not elsewhere classified  Pain in left ankle and joints of left foot     Problem List Patient Active Problem List   Diagnosis Date Noted  . Diabetes (HCC) 01/08/2014  . Essential hypertension 01/08/2014  . Hyperlipidemia 01/08/2014  . Postoperative anemia due to acute blood loss 11/22/2012  . Hyponatremia 11/06/2012  . OA (osteoarthritis) of knee 11/05/2012  . Villonodular synovitis of knee 04/20/2011    Lorrene ReidKelly Irina Okelly, PT 02/22/16 4:15 PM  [facility] [address], [address] [place], [place], [address] Phone: [phone]   Fax:  [phone]  Name: [name] MRN: [id number] Date of Birth: 10-15-1950

## 2016-02-23 DIAGNOSIS — Z8639 Personal history of other endocrine, nutritional and metabolic disease: Secondary | ICD-10-CM | POA: Diagnosis not present

## 2016-02-23 DIAGNOSIS — E113299 Type 2 diabetes mellitus with mild nonproliferative diabetic retinopathy without macular edema, unspecified eye: Secondary | ICD-10-CM | POA: Diagnosis not present

## 2016-02-23 DIAGNOSIS — Z794 Long term (current) use of insulin: Secondary | ICD-10-CM | POA: Diagnosis not present

## 2016-02-23 DIAGNOSIS — E1165 Type 2 diabetes mellitus with hyperglycemia: Secondary | ICD-10-CM | POA: Diagnosis not present

## 2016-02-23 DIAGNOSIS — Z79899 Other long term (current) drug therapy: Secondary | ICD-10-CM | POA: Diagnosis not present

## 2016-02-23 DIAGNOSIS — Z5181 Encounter for therapeutic drug level monitoring: Secondary | ICD-10-CM | POA: Diagnosis not present

## 2016-02-25 ENCOUNTER — Encounter: Payer: Self-pay | Admitting: Physical Therapy

## 2016-02-25 ENCOUNTER — Ambulatory Visit: Payer: 59 | Admitting: Physical Therapy

## 2016-02-25 DIAGNOSIS — Z5181 Encounter for therapeutic drug level monitoring: Secondary | ICD-10-CM | POA: Diagnosis not present

## 2016-02-25 DIAGNOSIS — M6281 Muscle weakness (generalized): Secondary | ICD-10-CM

## 2016-02-25 DIAGNOSIS — M25572 Pain in left ankle and joints of left foot: Secondary | ICD-10-CM

## 2016-02-25 DIAGNOSIS — M25571 Pain in right ankle and joints of right foot: Secondary | ICD-10-CM | POA: Diagnosis not present

## 2016-02-25 DIAGNOSIS — R262 Difficulty in walking, not elsewhere classified: Secondary | ICD-10-CM

## 2016-02-25 MED FILL — CIPROFLOXACIN HCL 500 MG TA: 500 | 7 days supply | Qty: 14 | Fill #0

## 2016-02-25 NOTE — Therapy (Signed)
[facility] [address], [address] [place], [place], [address] Phone: [phone]   Fax:  [phone]  Physical Therapy Treatment  Patient Details  Name: Alinda DeemBonnie D Guallpa MRN: [id number] Date of Birth: 1950/04/20 Referring Provider: Dr. [name] PennaZiegler  Encounter Date: 02/25/2016      PT End of Session - 02/25/16 1532    Visit Number 6   Number of Visits 10   Date for PT Re-Evaluation 04/06/15   PT Start Time 1530   PT Stop Time 1610   PT Time Calculation (min) 40 min   Activity Tolerance Patient tolerated treatment well      Past Medical History:  Diagnosis Date  . Anemia    hx of  . Arthritis   . Depression   . Diabetes mellitus ORAL MEDS  . GERD (gastroesophageal reflux disease)    occasional  . History of kidney stones   . Hyperlipidemia   . Hypertension   . Hypothyroidism   . Knee pain, right   . 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 OSA on CPAP   . Osteoarthritis   . Pinched nerve in neck   . Pneumonia    hx of  . PONV (postoperative nausea and vomiting)    for 3-4 days after general anesthesia  . Swelling of knee joint, right   . Synovitis of knee RIGHT    Past Surgical History:  Procedure Laterality Date  . CESAREAN SECTION  X3  . KNEE ARTHROSCOPY  04/20/2011   Procedure: ARTHROSCOPY KNEE;  Surgeon: Loanne DrillingFrank V Aluisio, MD;  [facility];  Service: Orthopedics;  Laterality: Right;  WITH SYNOVECTOMY  . LEFT CARPAL TUNNEL / LEFT MIDDLE & RING FINGER TRIGGER RELEASE  08-26-2008  . LEFT SHOULDER ARTHROSCOPY W/ DEBRIDEMENT  09-09-2003  . LEFT SHOULDER ARTHROSCOPY/ LEFT THUMB TRIGGER RELEASE  02-22-2005  . PULLEY RELEASE LEFT LONG FINGER  07-14-2009  . RIGHT CARPAL TUNNEL/ RIGHT THUMB TRIGGER RELEASE'S  11-28-2006  . RIGHT SHOULDER ARTHROSCOPY W/ ROTATOR CUFF REPAIR  01-13-2004  . SHOULDER ARTHROSCOPY DISTAL CLAVICLE EXCISION AND OPEN ROTATOR CUFF REPAIR  09-07-2004   LEFT  .  SHOULDER ARTHROSCOPY W/ ACROMIAL REPAIR  11-29-2005   LEFT  . TONSILLECTOMY AND ADENOIDECTOMY  child  . TOTAL KNEE ARTHROPLASTY  12-14-2009   RIGHT  . TOTAL KNEE ARTHROPLASTY Left 11/05/2012   Procedure: LEFT TOTAL KNEE ARTHROPLASTY;  Surgeon: Loanne DrillingFrank V Aluisio, MD;  Location: [facility];  Service: Orthopedics;  Laterality: Left;  . TRIGGER FINGER RELEASE Right 02/21/2013   Procedure: RIGHT RING A-1 PULLEY RELEASE    (MINOR PROCEDURE) ;  Surgeon: Wyn Forsterobert V Sypher Jr., MD;  [facility];  Service: Orthopedics;  Laterality: Right;    There were no vitals filed for this visit.      Subjective Assessment - 02/25/16 1531    Subjective Pt reports some bruising and soreness from dry needling. Has had a lot of difficulty with muscle cramping in Bil legs and in Lt arm.    Pertinent History gout in right foot 8 weeks ago;  Bilateral TKR;  HTN; DM;  hx of plantar fascitis and heel spurs on right improved with PT;  takes 1 mg Aspirin   Limitations Walking;Standing   How long can you walk comfortably? 15-20 min   Diagnostic tests bunionette on right    Patient Stated Goals I would like for this pain to go away on the side of my feet.  Currently in Pain? Yes   Pain Score 4    Pain Location Ankle   Pain Orientation Right;Left   Pain Descriptors / Indicators Sharp   Pain Type Chronic pain   Pain Onset More than a month ago   Pain Frequency Constant                         [facility] Adult PT Treatment/Exercise - 02/25/16 0001      Manual Therapy   Manual Therapy Soft tissue mobilization;Taping   Soft tissue mobilization anterior tib, plantar fascia IASTM   Passive ROM --  Tape facilitative for DF and ankle supination     Ankle Exercises: Stretches   Soleus Stretch 2 reps;10 seconds   Gastroc Stretch 2 reps;10 seconds     Ankle Exercises: Aerobic   Stationary Bike --  Nu step: Level 1x 10 minutes     Ankle Exercises: Supine   Isometrics ankle dorsi  flexion     Ankle Exercises: Standing   SLS 20 seconds  No shoes   Other Standing Ankle Exercises weight shifting  no shoes                  PT Short Term Goals - 02/22/16 1540      PT SHORT TERM GOAL #1   Title The patient will have basic knowledge of HEP to increase muscle length and initiate strengthening to correct muscle imbalances  03/08/16   Status Achieved     PT SHORT TERM GOAL #2   Title Pain improved by 25% with walking and standing at  home and work   Time 4   Period Weeks   Status On-going           PT Long Term Goals - 02/18/16 1719      PT LONG TERM GOAL #1   Title The patient will be independent in HEP and self management techniques   04/05/16   Time 8   Period Weeks   Status On-going     PT LONG TERM GOAL #2   Title The patient will have improved right ankle strength to 4/5 and left to 4+/5 needed for standing/walking longer periods of time    Time 8   Period Weeks   Status On-going     PT LONG TERM GOAL #3   Title The patient will be able to walk/stand for 25-30 min needed for work duties and grocery shopping   Time 8   Period Weeks   Status On-going     PT LONG TERM GOAL #4   Title The patient will report a 50% improvement in pain with home and work tasks    Time 8   Period Weeks   Status On-going     PT LONG TERM GOAL #5   Title FOTO functional outcome score improved from 45% to 31% indicating improved function with less pain   Time 8   Period Weeks   Status On-going               Plan - 02/25/16 1613    Clinical Impression Statement Pt continues to have some ankle pain and weakness. Reports foot stretching has helped. Pt continues to have Bil gastroc tightness not relieved with stretching. Pt will continue to benefit from skilled therapy for ankle strength and stability.    Rehab Potential Good   Clinical Impairments Affecting Rehab Potential hx of plantar fascitis, gout, diabetes;  chronic knee pain  following TKR    PT Frequency 2x / week   PT Duration 8 weeks   PT Treatment/Interventions ADLs/Self Care Home Management;Electrical Stimulation;Iontophoresis 4mg /ml Dexamethasone;Cryotherapy;Moist Heat;Ultrasound;Patient/family education;Therapeutic exercise;Manual techniques;Taping;Dry needling   PT Next Visit Plan ankle dorsi flexion strength, SLS   Consulted and Agree with Plan of Care Patient      Patient will benefit from skilled therapeutic intervention in order to improve the following deficits and impairments:  Decreased range of motion, Decreased strength, Hypomobility, Difficulty walking, Increased fascial restricitons, Increased muscle spasms, Impaired flexibility, Pain  Visit Diagnosis: Pain in right ankle and joints of right foot  Muscle weakness (generalized)  Difficulty in walking, not elsewhere classified  Pain in left ankle and joints of left foot     Problem List Patient Active Problem List   Diagnosis Date Noted  . Diabetes (HCC) 01/08/2014  . Essential hypertension 01/08/2014  . Hyperlipidemia 01/08/2014  . Postoperative anemia due to acute blood loss 11/22/2012  . Hyponatremia 11/06/2012  . OA (osteoarthritis) of knee 11/05/2012  . Villonodular synovitis of knee 04/20/2011    Dessa Phi PTA 02/25/2016, 4:15 PM  [facility] [address], [address] [place], [place], [address] Phone: [phone]   Fax:  [phone]  Name: SHEKINAH PITONES MRN: [id number] Date of Birth: 1951/01/08

## 2016-02-26 MED FILL — VIT D2 1.25 MG (50,000 UNIT: 1.25 MG | 56 days supply | Qty: 8 | Fill #0

## 2016-02-28 MED FILL — DULoxetine HCL 60 MG CPEP: 60 | 30 days supply | Qty: 30 | Fill #5

## 2016-02-28 MED FILL — VICTOZA 18 MG/3 ML INJECT P: 18 | 30 days supply | Qty: 9 | Fill #3

## 2016-02-29 MED FILL — AMOXICILLIN 500 MG CAPSULE: 500 | 5 days supply | Qty: 20 | Fill #0

## 2016-03-01 ENCOUNTER — Ambulatory Visit: Payer: 59 | Admitting: Physical Therapy

## 2016-03-01 DIAGNOSIS — M25571 Pain in right ankle and joints of right foot: Secondary | ICD-10-CM

## 2016-03-01 DIAGNOSIS — M25572 Pain in left ankle and joints of left foot: Secondary | ICD-10-CM

## 2016-03-01 DIAGNOSIS — M6281 Muscle weakness (generalized): Secondary | ICD-10-CM

## 2016-03-01 DIAGNOSIS — Z5181 Encounter for therapeutic drug level monitoring: Secondary | ICD-10-CM | POA: Diagnosis not present

## 2016-03-01 DIAGNOSIS — R262 Difficulty in walking, not elsewhere classified: Secondary | ICD-10-CM | POA: Diagnosis not present

## 2016-03-01 NOTE — Therapy (Signed)
[facility] [address], [address] [place], [place], [address] Phone: [phone]   Fax:  [phone]  Physical Therapy Treatment  Patient Details  Name: [name] MRN: [id number] Date of Birth: 1950-06-01 Referring Provider: Dr. [name] Penna  Encounter Date: 03/01/2016      PT End of Session - 03/01/16 1653    Visit Number 7   Number of Visits 10   Date for PT Re-Evaluation 04/06/15   Authorization Type UMR (although patient has Part A Medicare) so will apply G codes just in case   PT Start Time 1530   PT Stop Time 1620   PT Time Calculation (min) 50 min   Activity Tolerance Patient tolerated treatment well      Past Medical History:  Diagnosis Date  . Anemia    hx of  . Arthritis   . Depression   . Diabetes mellitus ORAL MEDS  . GERD (gastroesophageal reflux disease)    occasional  . History of kidney stones   . Hyperlipidemia   . Hypertension   . Hypothyroidism   . Knee pain, right   . 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 OSA on CPAP   . Osteoarthritis   . Pinched nerve in neck   . Pneumonia    hx of  . PONV (postoperative nausea and vomiting)    for 3-4 days after general anesthesia  . Swelling of knee joint, right   . Synovitis of knee RIGHT    Past Surgical History:  Procedure Laterality Date  . CESAREAN SECTION  X3  . KNEE ARTHROSCOPY  04/20/2011   Procedure: ARTHROSCOPY KNEE;  Surgeon: Loanne Drilling, MD;  [facility]  Service: Orthopedics;  Laterality: Right;  WITH SYNOVECTOMY  . LEFT CARPAL TUNNEL / LEFT MIDDLE & RING FINGER TRIGGER RELEASE  08-26-2008  . LEFT SHOULDER ARTHROSCOPY W/ DEBRIDEMENT  09-09-2003  . LEFT SHOULDER ARTHROSCOPY/ LEFT THUMB TRIGGER RELEASE  02-22-2005  . PULLEY RELEASE LEFT LONG FINGER  07-14-2009  . RIGHT CARPAL TUNNEL/ RIGHT THUMB TRIGGER RELEASE'S  11-28-2006  . RIGHT SHOULDER ARTHROSCOPY W/ ROTATOR CUFF REPAIR  01-13-2004  .  SHOULDER ARTHROSCOPY DISTAL CLAVICLE EXCISION AND OPEN ROTATOR CUFF REPAIR  09-07-2004   LEFT  . SHOULDER ARTHROSCOPY W/ ACROMIAL REPAIR  11-29-2005   LEFT  . TONSILLECTOMY AND ADENOIDECTOMY  child  . TOTAL KNEE ARTHROPLASTY  12-14-2009   RIGHT  . TOTAL KNEE ARTHROPLASTY Left 11/05/2012   Procedure: LEFT TOTAL KNEE ARTHROPLASTY;  Surgeon: Loanne Drilling, MD;  Location: [facility];  Service: Orthopedics;  Laterality: Left;  . TRIGGER FINGER RELEASE Right 02/21/2013   Procedure: RIGHT RING A-1 PULLEY RELEASE    (MINOR PROCEDURE) ;  Surgeon: Wyn Forster., MD;  Location: [facility];  Service: Orthopedics;  Laterality: Right;    There were no vitals filed for this visit.      Subjective Assessment - 03/01/16 1533    Subjective I can't wear my Birkenstocks all day.  Some achiness lateral right ankle.  Been working and volunteering at night.  On feet a lot.  Taping helped a lot   Currently in Pain? Yes   Pain Location Ankle   Pain Orientation Right   Pain Type Chronic pain                         [facility] Adult PT Treatment/Exercise - 03/01/16 0001  Moist Heat Therapy   Number Minutes Moist Heat 10 Minutes   Moist Heat Location Ankle     Manual Therapy   Manual Therapy Joint mobilization;Taping   Joint Mobilization prone dorsiflexion with knee flexion 2x10; inv/eversion mob grade 3 2x10;  talocrual mob grade 3 10x   Soft tissue mobilization posterior tibialis, gastroc/soleus   Passive ROM ankle DF and toe extension 3x 30 sec each   Kinesiotex Facilitate Muscle     Kinesiotix   Facilitate Muscle  bilateral with supination and lateral support     Ankle Exercises: Standing   Other Standing Ankle Exercises review of HEP          Trigger Point Dry Needling - 03/01/16 1659    Consent Given? Yes   Muscles Treated Lower Body --  extensor digitorum brevis and longus right   Gastrocnemius Response Twitch response elicited;Palpable increased  muscle length   Soleus Response Twitch response elicited;Palpable increased muscle length      Right only          PT Short Term Goals - 03/01/16 1659      PT SHORT TERM GOAL #1   Title The patient will have basic knowledge of HEP to increase muscle length and initiate strengthening to correct muscle imbalances  03/08/16   Status Achieved     PT SHORT TERM GOAL #2   Title Pain improved by 25% with walking and standing at  home and work   Time 4   Period Weeks   Status On-going     PT SHORT TERM GOAL #3   Title Improved bilateral gastroc muscle lengths allowing at least 4 degrees of ankle dorsiflexion bilaterally   Time 4   Period Weeks   Status On-going           PT Long Term Goals - 03/01/16 1702      PT LONG TERM GOAL #1   Title The patient will be independent in HEP and self management techniques   04/05/16   Time 8   Period Weeks   Status On-going     PT LONG TERM GOAL #2   Title The patient will have improved right ankle strength to 4/5 and left to 4+/5 needed for standing/walking longer periods of time    Time 8   Period Weeks   Status On-going     PT LONG TERM GOAL #3   Title The patient will be able to walk/stand for 25-30 min needed for work duties and grocery shopping   Time 8   Period Weeks   Status On-going     PT LONG TERM GOAL #4   Title The patient will report a 50% improvement in pain with home and work tasks    Time 8   Period Weeks   Status On-going     PT LONG TERM GOAL #5   Title FOTO functional outcome score improved from 45% to 31% indicating improved function with less pain   Time 8   Period Weeks               Plan - 03/01/16 1654    Clinical Impression Statement The patient has tender points in right extensor digitorum brevis and longus as well as gastrocnemius.  Improved muscle length following dry needling and manual therapy.  The patient reports good compliance with home stretching and strengthening.  Decreased  overall pain intensity which is very good considering she has been on her feet a lot in the  evenings as she volunteers at [employer].  She states she has a bladder infection and was prescribed Cipro but she is nervous to take after reading side effects regarding spasms.  She plans to discuss with her doctor.     PT Next Visit Plan ankle dorsi flexion strength, SLS;  dry needling;  instrument assisted soft tissue work;  kinesiotaping;  check % improve and ankle ROM for STGS      Patient will benefit from skilled therapeutic intervention in order to improve the following deficits and impairments:     Visit Diagnosis: Pain in right ankle and joints of right foot  Muscle weakness (generalized)  Difficulty in walking, not elsewhere classified  Pain in left ankle and joints of left foot     Problem List Patient Active Problem List   Diagnosis Date Noted  . Diabetes (HCC) 01/08/2014  . Essential hypertension 01/08/2014  . Hyperlipidemia 01/08/2014  . Postoperative anemia due to acute blood loss 11/22/2012  . Hyponatremia 11/06/2012  . OA (osteoarthritis) of knee 11/05/2012  . Villonodular synovitis of knee 04/20/2011   Lavinia SharpsStacy Simpson, PT 03/01/16 5:05 PM Phone: [phone] Fax: [phone]  Vivien PrestoSimpson, Stacy C 03/01/2016, 5:04 PM  [facility] [address], [address] [place], [place], [address] Phone: [phone]   Fax:  [phone]  Name: [name] MRN: [id number] Date of Birth: 12-Mar-1951

## 2016-03-03 ENCOUNTER — Encounter: Payer: Self-pay | Admitting: Physical Therapy

## 2016-03-03 ENCOUNTER — Ambulatory Visit: Payer: 59 | Admitting: Physical Therapy

## 2016-03-03 DIAGNOSIS — M25572 Pain in left ankle and joints of left foot: Secondary | ICD-10-CM | POA: Diagnosis not present

## 2016-03-03 DIAGNOSIS — R262 Difficulty in walking, not elsewhere classified: Secondary | ICD-10-CM | POA: Diagnosis not present

## 2016-03-03 DIAGNOSIS — M25571 Pain in right ankle and joints of right foot: Secondary | ICD-10-CM | POA: Diagnosis not present

## 2016-03-03 DIAGNOSIS — Z5181 Encounter for therapeutic drug level monitoring: Secondary | ICD-10-CM | POA: Diagnosis not present

## 2016-03-03 DIAGNOSIS — M6281 Muscle weakness (generalized): Secondary | ICD-10-CM | POA: Diagnosis not present

## 2016-03-03 NOTE — Therapy (Signed)
[facility] [address], [address] [place], [place], [address] Phone: [phone]   Fax:  [phone]  Physical Therapy Treatment  Patient Details  Name: [name] MRN: [id number] Date of Birth: 1950-08-11 Referring Provider: Dr. [name] PennaZiegler  Encounter Date: 03/03/2016      PT End of Session - 03/03/16 1529    Visit Number 8   Number of Visits 10   Date for PT Re-Evaluation 04/06/15   Authorization Type UMR (although patient has Part A Medicare) so will apply G codes just in case   PT Start Time 1524   PT Stop Time 1609   PT Time Calculation (min) 45 min   Activity Tolerance Patient tolerated treatment well      Past Medical History:  Diagnosis Date  . Anemia    hx of  . Arthritis   . Depression   . Diabetes mellitus ORAL MEDS  . GERD (gastroesophageal reflux disease)    occasional  . History of kidney stones   . Hyperlipidemia   . Hypertension   . Hypothyroidism   . Knee pain, right   . 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 OSA on CPAP   . Osteoarthritis   . Pinched nerve in neck   . Pneumonia    hx of  . PONV (postoperative nausea and vomiting)    for 3-4 days after general anesthesia  . Swelling of knee joint, right   . Synovitis of knee RIGHT    Past Surgical History:  Procedure Laterality Date  . CESAREAN SECTION  X3  . KNEE ARTHROSCOPY  04/20/2011   Procedure: ARTHROSCOPY KNEE;  Surgeon: Loanne DrillingFrank V Aluisio, MD;  [facility] [place];  Service: Orthopedics;  Laterality: Right;  WITH SYNOVECTOMY  . LEFT CARPAL TUNNEL / LEFT MIDDLE & RING FINGER TRIGGER RELEASE  08-26-2008  . LEFT SHOULDER ARTHROSCOPY W/ DEBRIDEMENT  09-09-2003  . LEFT SHOULDER ARTHROSCOPY/ LEFT THUMB TRIGGER RELEASE  02-22-2005  . PULLEY RELEASE LEFT LONG FINGER  07-14-2009  . RIGHT CARPAL TUNNEL/ RIGHT THUMB TRIGGER RELEASE'S  11-28-2006  . RIGHT SHOULDER ARTHROSCOPY W/ ROTATOR CUFF REPAIR  01-13-2004  .  SHOULDER ARTHROSCOPY DISTAL CLAVICLE EXCISION AND OPEN ROTATOR CUFF REPAIR  09-07-2004   LEFT  . SHOULDER ARTHROSCOPY W/ ACROMIAL REPAIR  11-29-2005   LEFT  . TONSILLECTOMY AND ADENOIDECTOMY  child  . TOTAL KNEE ARTHROPLASTY  12-14-2009   RIGHT  . TOTAL KNEE ARTHROPLASTY Left 11/05/2012   Procedure: LEFT TOTAL KNEE ARTHROPLASTY;  Surgeon: Loanne DrillingFrank V Aluisio, MD;  Location: [facility];  Service: Orthopedics;  Laterality: Left;  . TRIGGER FINGER RELEASE Right 02/21/2013   Procedure: RIGHT RING A-1 PULLEY RELEASE    (MINOR PROCEDURE) ;  Surgeon: Wyn Forsterobert V Sypher Jr., MD;  [facility] [place];  Service: Orthopedics;  Laterality: Right;    There were no vitals filed for this visit.      Subjective Assessment - 03/03/16 1527    Subjective Lt foot hurting a little worse than Rt today. Tape may have been too tight on Lt last time.    Pertinent History gout in right foot 8 weeks ago;  Bilateral TKR;  HTN; DM;  hx of plantar fascitis and heel spurs on right improved with PT;  takes 1 mg Aspirin   Limitations Walking;Standing   How long can you walk comfortably? 15-20 min   Diagnostic tests bunionette on right    Patient Stated Goals I would like  for this pain to go away on the side of my feet.     Currently in Pain? Yes   Pain Score 2    Pain Location Ankle   Pain Orientation Right   Pain Descriptors / Indicators Sharp   Pain Type Chronic pain   Pain Onset More than a month ago   Pain Frequency Constant            [facility] PT Assessment - 03/03/16 0001      AROM   Right Ankle Dorsiflexion 14   Left Ankle Dorsiflexion 16                     [facility] Adult PT Treatment/Exercise - 03/03/16 0001      Exercises   Exercises Knee/Hip     Knee/Hip Exercises: Standing   Hip Flexion Stengthening;Both;2 sets;10 reps   Hip Abduction Stengthening;Both;2 sets;10 reps   Hip Extension Stengthening;Both;2 sets;10 reps     Knee/Hip Exercises: Seated   Sit to Sand 10 reps      Manual Therapy   Manual Therapy Taping   Kinesiotex Facilitate Muscle     Ankle Exercises: Standing   SLS 20 seconds  No shoes   Rebounder 3 ways x 1 minute each   Other Standing Ankle Exercises weight shifting  no shoes     Ankle Exercises: Stretches   Soleus Stretch 2 reps;10 seconds   Gastroc Stretch 2 reps;10 seconds     Ankle Exercises: Aerobic   Stationary Bike --  Nu step: Level 2x 10 minutes                  PT Short Term Goals - 03/03/16 1605      PT SHORT TERM GOAL #2   Title Pain improved by 25% with walking and standing at  home and work   Baseline 60%   Time 4   Period Weeks   Status Achieved     PT SHORT TERM GOAL #3   Title Improved bilateral gastroc muscle lengths allowing at least 4 degrees of ankle dorsiflexion bilaterally   Baseline 16 Lt 14 Rt   Time 4   Period Weeks   Status Achieved           PT Long Term Goals - 03/01/16 1702      PT LONG TERM GOAL #1   Title The patient will be independent in HEP and self management techniques   04/05/16   Time 8   Period Weeks   Status On-going     PT LONG TERM GOAL #2   Title The patient will have improved right ankle strength to 4/5 and left to 4+/5 needed for standing/walking longer periods of time    Time 8   Period Weeks   Status On-going     PT LONG TERM GOAL #3   Title The patient will be able to walk/stand for 25-30 min needed for work duties and grocery shopping   Time 8   Period Weeks   Status On-going     PT LONG TERM GOAL #4   Title The patient will report a 50% improvement in pain with home and work tasks    Time 8   Period Weeks   Status On-going     PT LONG TERM GOAL #5   Title FOTO functional outcome score improved from 45% to 31% indicating improved function with less pain   Time 8   Period Weeks  Plan - 03/03/16 1600    Clinical Impression Statement Pt reports ankles feeling btter. Continues to have weak LE strength with single leg  stance gripping with toes. Pt will continue to benefit from skilled therapy for LE strengthening and foot/ ankle stability.    Rehab Potential Good   Clinical Impairments Affecting Rehab Potential hx of plantar fascitis, gout, diabetes;  chronic knee pain following TKR   PT Frequency 2x / week   PT Duration 8 weeks   PT Treatment/Interventions ADLs/Self Care Home Management;Electrical Stimulation;Iontophoresis 4mg /ml Dexamethasone;Cryotherapy;Moist Heat;Ultrasound;Patient/family education;Therapeutic exercise;Manual techniques;Taping;Dry needling   PT Next Visit Plan ankle dorsi flexion strength, SLS;  dry needling;  instrument assisted soft tissue work;  kinesiotaping;  check % improve and ankle ROM for STGS      Patient will benefit from skilled therapeutic intervention in order to improve the following deficits and impairments:  Decreased range of motion, Decreased strength, Hypomobility, Difficulty walking, Increased fascial restricitons, Increased muscle spasms, Impaired flexibility, Pain  Visit Diagnosis: Pain in right ankle and joints of right foot  Muscle weakness (generalized)  Difficulty in walking, not elsewhere classified  Pain in left ankle and joints of left foot     Problem List Patient Active Problem List   Diagnosis Date Noted  . Diabetes (HCC) 01/08/2014  . Essential hypertension 01/08/2014  . Hyperlipidemia 01/08/2014  . Postoperative anemia due to acute blood loss 11/22/2012  . Hyponatremia 11/06/2012  . OA (osteoarthritis) of knee 11/05/2012  . Villonodular synovitis of knee 04/20/2011    Dessa PhiKatherine Saanya Zieske PTA 03/03/2016, 4:11 PM  [facility] [address], [address] [place], [place], [address] Phone: [phone]   Fax:  [phone]  Name: [name] MRN: [id number] Date of Birth: 11-Nov-1950

## 2016-03-08 ENCOUNTER — Ambulatory Visit: Payer: 59 | Admitting: Physical Therapy

## 2016-03-08 DIAGNOSIS — M25572 Pain in left ankle and joints of left foot: Secondary | ICD-10-CM | POA: Diagnosis not present

## 2016-03-08 DIAGNOSIS — M6281 Muscle weakness (generalized): Secondary | ICD-10-CM

## 2016-03-08 DIAGNOSIS — M25571 Pain in right ankle and joints of right foot: Secondary | ICD-10-CM

## 2016-03-08 DIAGNOSIS — R262 Difficulty in walking, not elsewhere classified: Secondary | ICD-10-CM | POA: Diagnosis not present

## 2016-03-08 DIAGNOSIS — Z5181 Encounter for therapeutic drug level monitoring: Secondary | ICD-10-CM | POA: Diagnosis not present

## 2016-03-08 NOTE — Therapy (Signed)
[facility] [address], [address] [place], [place], [address] Phone: [phone]   Fax:  [phone]  Physical Therapy Treatment  Patient Details  Name: Stefanie DeemBonnie D Bierly MRN: [id number] Date of Birth: [birth year] Referring Provider: Dr. [name] PennaZiegler  Encounter Date: 03/08/2016      PT End of Session - 03/08/16 1545    Visit Number 9   Number of Visits 10   Date for PT Re-Evaluation 04/06/15   Authorization Type UMR (although patient has Part A Medicare) so will apply G codes just in case   PT Start Time 1445   PT Stop Time 1553   PT Time Calculation (min) 68 min   Activity Tolerance Patient tolerated treatment well      Past Medical History:  Diagnosis Date  . Anemia    hx of  . Arthritis   . Depression   . Diabetes mellitus [facility]AL [facility]S  . GERD (gastroesophageal reflux disease)    occasional  . History of kidney stones   . Hyperlipidemia   . Hypertension   . Hypothyroidism   . Knee pain, right   . 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 OSA on CPAP   . Osteoarthritis   . Pinched nerve in neck   . Pneumonia    hx of  . PONV (postoperative nausea and vomiting)    for 3-4 days after general anesthesia  . Swelling of knee joint, right   . Synovitis of knee RIGHT    Past Surgical History:  Procedure Laterality Date  . CESAREAN SECTION  X3  . KNEE ARTHROSCOPY  04/20/2011   Procedure: ARTHROSCOPY KNEE;  Surgeon: Loanne DrillingFrank V Aluisio, MD;  [facility] [place];  Service: Orthopedics;  Laterality: Right;  WITH SYNOVECTOMY  . LEFT CARPAL TUNNEL / LEFT MIDDLE & RING FINGER TRIGGER RELEASE  08-26-2008  . LEFT SHOULDER ARTHROSCOPY W/ DEBRIDEMENT  09-09-2003  . LEFT SHOULDER ARTHROSCOPY/ LEFT THUMB TRIGGER RELEASE  02-22-2005  . PULLEY RELEASE LEFT LONG FINGER  07-14-2009  . RIGHT CARPAL TUNNEL/ RIGHT THUMB TRIGGER RELEASE'S  11-28-2006  . RIGHT SHOULDER ARTHROSCOPY W/ ROTATOR CUFF REPAIR  01-13-2004  .  SHOULDER ARTHROSCOPY DISTAL CLAVICLE EXCISION AND OPEN ROTATOR CUFF REPAIR  09-07-2004   LEFT  . SHOULDER ARTHROSCOPY W/ ACROMIAL REPAIR  11-29-2005   LEFT  . TONSILLECTOMY AND ADENOIDECTOMY  child  . TOTAL KNEE ARTHROPLASTY  12-14-2009   RIGHT  . TOTAL KNEE ARTHROPLASTY Left 11/05/2012   Procedure: LEFT TOTAL KNEE ARTHROPLASTY;  Surgeon: Loanne DrillingFrank V Aluisio, MD;  Location[facility];  Service: Orthopedics;  Laterality: Left;  . TRIGGER FINGER RELEASE Right 02/21/2013   Procedure: RIGHT RING A-1 PULLEY RELEASE    (MINOR PROCEDURE) ;  Surgeon: Wyn Forsterobert V Sypher Jr., MD;  [facility] [place];  Service: Orthopedics;  Laterality: Right;    There were no vitals filed for this visit.      Subjective Assessment - 03/08/16 1452    Subjective Rough day at work but not b/c of foot.  I think I've reached a point where my feet don't feel bad at all.  I think the tape might be bothering my feet.     Currently in Pain? Yes   Pain Score 1    Pain Location Ankle   Pain Orientation Right   Pain Type Chronic pain  [facility] Adult PT Treatment/Exercise - 03/08/16 0001      Knee/Hip Exercises: Standing   SLS SLS with gluteal squeeze 10x right and left   Other Standing Knee Exercises calf raises 15x bilaterally     Knee/Hip Exercises: Seated   Long Arc Quad Limitations intrinsic towel squeezes 20x    Heel Slides Limitations rubber band toe abduction bilaterally 20x   Other Seated Knee/Hip Exercises intrinsic dome lifts with ball squeeze 20x   Marching Limitations red  band eversion 25x     Moist Heat Therapy   Number Minutes Moist Heat 15 Minutes  extra layering for heat sensitivity   Moist Heat Location Ankle     Electrical Stimulation   Electrical Stimulation Location bilateral ankle/calf   Electrical Stimulation Action Pre-mod   Electrical Stimulation Parameters 20 ma   Electrical Stimulation Goals Pain     Ankle Exercises: Aerobic    Stationary Bike --  Nu step: Level 2x 10 minutes     Ankle Exercises: Seated   Other Seated Ankle Exercises dome arch lifts 10x                  PT Short Term Goals - 03/08/16 1548      PT SHORT TERM GOAL #1   Title The patient will have basic knowledge of HEP to increase muscle length and initiate strengthening to correct muscle imbalances  03/08/16   Status Achieved     PT SHORT TERM GOAL #2   Title Pain improved by 25% with walking and standing at  home and work   Status Achieved     PT SHORT TERM GOAL #3   Title Improved bilateral gastroc muscle lengths allowing at least 4 degrees of ankle dorsiflexion bilaterally   Status Achieved           PT Long Term Goals - 03/08/16 1549      PT LONG TERM GOAL #1   Title The patient will be independent in HEP and self management techniques   04/05/16   Time 8   Period Weeks   Status On-going     PT LONG TERM GOAL #2   Title The patient will have improved right ankle strength to 4/5 and left to 4+/5 needed for standing/walking longer periods of time    Time 8   Period Weeks   Status On-going     PT LONG TERM GOAL #3   Title The patient will be able to walk/stand for 25-30 min needed for work duties and grocery shopping   Time 8   Period Weeks   Status On-going     PT LONG TERM GOAL #4   Title The patient will report a 50% improvement in pain with home and work tasks    Time 8   Period Weeks   Status On-going     PT LONG TERM GOAL #5   Title FOTO functional outcome score improved from 45% to 31% indicating improved function with less pain   Time 8   Period Weeks   Status On-going               Plan - 03/08/16 1545    Clinical Impression Statement The patient reports she is feeling better overall after treatment session although her muscles "feel tired."  Patient states she wonders if she will be hurting and having spasms later secondary to all the exercise; therefore electrical stim and heat  applied.   Improving foot intrinsic strength.  Weakness with  ankle eversion and small toe abduction.  Therapist closely monitoring response with all interventions.     PT Next Visit Plan ankle dorsi flexion strength, SLS;  dry needling;  instrument assisted soft tissue work;  kinesiotaping;  check % improve and ankle ROM for STGS;  G code next visit      Patient will benefit from skilled therapeutic intervention in order to improve the following deficits and impairments:     Visit Diagnosis: Pain in right ankle and joints of right foot  Muscle weakness (generalized)  Difficulty in walking, not elsewhere classified  Pain in left ankle and joints of left foot     Problem List Patient Active Problem List   Diagnosis Date Noted  . Diabetes (HCC) 01/08/2014  . Essential hypertension 01/08/2014  . Hyperlipidemia 01/08/2014  . Postoperative anemia due to acute blood loss 11/22/2012  . Hyponatremia 11/06/2012  . OA (osteoarthritis) of knee 11/05/2012  . Villonodular synovitis of knee 04/20/2011   Lavinia Sharps, PT 03/08/16 3:50 PM Phone: [phone] Fax: [phone]  Vivien Presto 03/08/2016, 3:50 PM  [facility] [address] [place], [place], [address] Phone: [phone]   Fax:  [phone]  Name: LISHA VITALE MRN: [id number] Date of Birth: November 30, 1950

## 2016-03-10 ENCOUNTER — Ambulatory Visit: Payer: 59 | Admitting: Physical Therapy

## 2016-03-10 DIAGNOSIS — M25571 Pain in right ankle and joints of right foot: Secondary | ICD-10-CM | POA: Diagnosis not present

## 2016-03-10 DIAGNOSIS — M6281 Muscle weakness (generalized): Secondary | ICD-10-CM

## 2016-03-10 DIAGNOSIS — R262 Difficulty in walking, not elsewhere classified: Secondary | ICD-10-CM

## 2016-03-10 DIAGNOSIS — M25572 Pain in left ankle and joints of left foot: Secondary | ICD-10-CM

## 2016-03-10 DIAGNOSIS — Z5181 Encounter for therapeutic drug level monitoring: Secondary | ICD-10-CM | POA: Diagnosis not present

## 2016-03-10 NOTE — Therapy (Signed)
[facility] [address] [place], [place], [address] Phone: [phone]   Fax:  [phone]  Physical Therapy Treatment/Discharge Summary  Patient Details  Name: [name] MRN: [id number] Date of Birth: 1951-02-16 Referring Provider: Dr. Geroge Baseman  Encounter Date: 03/10/2016      PT End of Session - 03/10/16 1709    Visit Number 10   Number of Visits 10   Date for PT Re-Evaluation 04/06/15   Authorization Type UMR (although patient has Part A Medicare) so will apply G codes just in case   PT Start Time 1530   PT Stop Time 1615   PT Time Calculation (min) 45 min   Activity Tolerance Patient tolerated treatment well      Past Medical History:  Diagnosis Date  . Anemia    hx of  . Arthritis   . Depression   . Diabetes mellitus ORAL MEDS  . GERD (gastroesophageal reflux disease)    occasional  . History of kidney stones   . Hyperlipidemia   . Hypertension   . Hypothyroidism   . Knee pain, right   . 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 OSA on CPAP   . Osteoarthritis   . Pinched nerve in neck   . Pneumonia    hx of  . PONV (postoperative nausea and vomiting)    for 3-4 days after general anesthesia  . Swelling of knee joint, right   . Synovitis of knee RIGHT    Past Surgical History:  Procedure Laterality Date  . CESAREAN SECTION  X3  . KNEE ARTHROSCOPY  04/20/2011   Procedure: ARTHROSCOPY KNEE;  Surgeon: Gearlean Alf, MD;  [facility];  Service: Orthopedics;  Laterality: Right;  WITH SYNOVECTOMY  . LEFT CARPAL TUNNEL / LEFT MIDDLE & RING FINGER TRIGGER RELEASE  08-26-2008  . LEFT SHOULDER ARTHROSCOPY W/ DEBRIDEMENT  09-09-2003  . LEFT SHOULDER ARTHROSCOPY/ LEFT THUMB TRIGGER RELEASE  02-22-2005  . PULLEY RELEASE LEFT LONG FINGER  07-14-2009  . RIGHT CARPAL TUNNEL/ RIGHT THUMB TRIGGER RELEASE'S  11-28-2006  . RIGHT SHOULDER ARTHROSCOPY W/ ROTATOR CUFF  REPAIR  01-13-2004  . SHOULDER ARTHROSCOPY DISTAL CLAVICLE EXCISION AND OPEN ROTATOR CUFF REPAIR  09-07-2004   LEFT  . SHOULDER ARTHROSCOPY W/ ACROMIAL REPAIR  11-29-2005   LEFT  . TONSILLECTOMY AND ADENOIDECTOMY  child  . TOTAL KNEE ARTHROPLASTY  12-14-2009   RIGHT  . TOTAL KNEE ARTHROPLASTY Left 11/05/2012   Procedure: LEFT TOTAL KNEE ARTHROPLASTY;  Surgeon: Gearlean Alf, MD;  [facility]: [facility];  Service: Orthopedics;  Laterality: Left;  . TRIGGER FINGER RELEASE Right 02/21/2013   Procedure: RIGHT RING A-1 PULLEY RELEASE    (MINOR PROCEDURE) ;  Surgeon: Cammie Sickle., MD;  Location: [facility];  Service: Orthopedics;  Laterality: Right;    There were no vitals filed for this visit.      Subjective Assessment - 03/10/16 1533    Subjective My feet feel pretty good although they felt heavy this morning.   My right calf was sore after therapy but it stays that way.     Currently in Pain? Yes   Pain Score 1    Pain Location Ankle   Pain Orientation Right   Pain Type Chronic pain            [facility] PT Assessment - 03/10/16 0001      Observation/Other Assessments   Focus on Therapeutic  Outcomes (FOTO)  36% limitation      AROM   Right Ankle Dorsiflexion 5   Right Ankle Inversion 47   Right Ankle Eversion 40   Left Ankle Dorsiflexion 6   Left Ankle Inversion 43   Left Ankle Eversion 20     Strength   Right Ankle Dorsiflexion 4/5   Right Ankle Plantar Flexion 4/5   Right Ankle Inversion 4/5   Right Ankle Eversion 4-/5   Left Ankle Dorsiflexion 4/5   Left Ankle Plantar Flexion 4/5   Left Ankle Inversion 4/5   Left Ankle Eversion 4/5                     [facility] Adult PT Treatment/Exercise - 03/10/16 0001      Knee/Hip Exercises: Aerobic   Nustep 10 min L1     Knee/Hip Exercises: Seated   Long Arc Quad Limitations intrinsic towel squeezes 20x    Heel Slides Limitations rubber band toe abduction bilaterally 20x   Other Seated Knee/Hip  Exercises intrinsic dome lifts with ball squeeze 20x   Marching Limitations red  band eversion 25x                  PT Short Term Goals - 03/10/16 1724      PT SHORT TERM GOAL #1   Title The patient will have basic knowledge of HEP to increase muscle length and initiate strengthening to correct muscle imbalances  03/08/16   Status Achieved     PT SHORT TERM GOAL #2   Title Pain improved by 25% with walking and standing at  home and work   Status Achieved     PT SHORT TERM GOAL #3   Title Improved bilateral gastroc muscle lengths allowing at least 4 degrees of ankle dorsiflexion bilaterally   Status Achieved           PT Long Term Goals - 03/10/16 1558      PT LONG TERM GOAL #1   Title The patient will be independent in HEP and self management techniques   04/05/16   Status Achieved     PT LONG TERM GOAL #2   Title The patient will have improved right ankle strength to 4/5 and left to 4+/5 needed for standing/walking longer periods of time    Status Partially Met     PT LONG TERM GOAL #3   Title The patient will be able to walk/stand for 25-30 min needed for work duties and grocery shopping   Baseline 15 min   Status Partially Met     PT LONG TERM GOAL #4   Title The patient will report a 50% improvement in pain with home and work tasks    Baseline 80% better   Status Achieved     PT LONG TERM GOAL #5   Title FOTO functional outcome score improved from 45% to 31% indicating improved function with less pain   Status Partially Met               Plan - 03/10/16 1716    Clinical Impression Statement The patient reports she is satisfied with her present functional level with her bilateral ankle/foot pain.  She rates her overall improvement at 80%.   Treatment interventions included manual therapy instrument assisted, dry needling, joint mobilization, therapeutic exercise, taping and modalities.   Her ankle dorsiflexion ROM has improved bilaterally as well  as ankle eversion and toe intrinsic strength.  She does continue to  have night cramps/spasms periodically.  She has met the majority of rehab goals and will discharge from PT at this time secondary to patient is satisfied with her current functional level.          PHYSICAL THERAPY DISCHARGE SUMMARY  Visits from Start of Care:  10  Current functional level related to goals / functional outcomes: See clinical impressions above  Remaining deficits: As above   Education / Equipment: Comprehensive HEP Plan: Patient agrees to discharge.  Patient goals were partially met. Patient is being discharged due to being pleased with the current functional level.  ?????         Patient will benefit from skilled therapeutic intervention in order to improve the following deficits and impairments:     Visit Diagnosis: Pain in right ankle and joints of right foot  Muscle weakness (generalized)  Difficulty in walking, not elsewhere classified  Pain in left ankle and joints of left foot       G-Codes - 01-Apr-2016 1609    Functional Assessment Tool Used FOTO; clinical judgement    Functional Limitation Mobility: Walking and moving around   Mobility: Walking and Moving Around Current Status ([id number]) At least 40 percent but less than 60 percent impaired, limited or restricted   Mobility: Walking and Moving Around Discharge Status [phone]) At least 20 percent but less than 40 percent impaired, limited or restricted      Problem List Patient Active Problem List   Diagnosis Date Noted  . Diabetes ([place]) 01/08/2014  . Essential hypertension 01/08/2014  . Hyperlipidemia 01/08/2014  . Postoperative anemia due to acute blood loss 11/22/2012  . Hyponatremia 11/06/2012  . OA (osteoarthritis) of knee 11/05/2012  . Villonodular synovitis of knee 04/20/2011   Ruben Im, PT 01-Apr-2016 5:26 PM Phone: [phone] Fax: [phone]  Alvera Singh Apr 01, 2016, 5:24 PM  [facility] [address], [place], [place], [address] Phone: [phone]   Fax:  [phone]  Name: ANTONELA FREIMAN MRN: [id number] Date of Birth: [birth year]

## 2016-03-15 DIAGNOSIS — M7671 Peroneal tendinitis, right leg: Secondary | ICD-10-CM | POA: Diagnosis not present

## 2016-03-16 ENCOUNTER — Ambulatory Visit (INDEPENDENT_AMBULATORY_CARE_PROVIDER_SITE_OTHER): Payer: Self-pay

## 2016-03-16 ENCOUNTER — Ambulatory Visit (INDEPENDENT_AMBULATORY_CARE_PROVIDER_SITE_OTHER): Payer: 59 | Admitting: Orthopaedic Surgery

## 2016-03-16 ENCOUNTER — Encounter (INDEPENDENT_AMBULATORY_CARE_PROVIDER_SITE_OTHER): Payer: Self-pay | Admitting: Orthopaedic Surgery

## 2016-03-16 DIAGNOSIS — M25561 Pain in right knee: Secondary | ICD-10-CM

## 2016-03-16 DIAGNOSIS — M65321 Trigger finger, right index finger: Secondary | ICD-10-CM

## 2016-03-16 DIAGNOSIS — M65322 Trigger finger, left index finger: Secondary | ICD-10-CM | POA: Diagnosis not present

## 2016-03-16 DIAGNOSIS — G8929 Other chronic pain: Secondary | ICD-10-CM | POA: Diagnosis not present

## 2016-03-16 MED ORDER — METHYLPREDNISOLONE ACETATE 40 MG/ML IJ SUSP
40.0000 mg | INTRAMUSCULAR | Status: AC | PRN
  Administered 2016-03-16: 40 mg

## 2016-03-16 MED ORDER — LIDOCAINE HCL 1 % IJ SOLN
1.0000 mL | INTRAMUSCULAR | Status: AC | PRN
  Administered 2016-03-16: 1 mL

## 2016-03-16 MED FILL — ATORVASTATIN 40 MG TABLET: 40 | 90 days supply | Qty: 90 | Fill #3

## 2016-03-16 MED FILL — LANTUS 100 UNITS/ML VIAL: 100 | 88 days supply | Qty: 80 | Fill #1

## 2016-03-16 MED FILL — UNIFINE PENTIPS 32GX5/32: 32G X 4 MM | 90 days supply | Qty: 100 | Fill #1

## 2016-03-16 MED FILL — UNIFINE PENTIPS 32GX5/32": 32G X 4 MM | 90 days supply | Qty: 100 | Fill #1

## 2016-03-16 NOTE — Progress Notes (Signed)
Office Visit Note   Patient: [name]           Date of Birth: 1950/07/19           MRN: [id number] Visit Date: 03/16/2016              Requested by: Marden Nobleobert Gates, MD [address] [place], [place] [address] PCP: Pearla DubonnetGATES,ROBERT NEVILL, MD   Assessment & Plan: Visit Diagnoses:  1. Chronic pain of right knee   2. Trigger index finger of left hand   3. Trigger index finger of right hand     Plan: She tolerated the trigger finger injections in both hands well. She is a watch her blood glucose given that she is a diabetic. As far as her right knee goes, at this standpoint my recommendation would be a poly--liner exchange with the possibility of a full revision depending what our findings are intraoperative. I've told her I'm happy to do this for her but she can also see her primary orthopedic doctor for this as well if she would prefer that. My main goal of this meeting was to at least give her other options. She is artery tried and failed all other treatment measures.  Follow-Up Instructions: Return if symptoms worsen or fail to improve.   Orders:  Orders Placed This Encounter  Procedures  . Large Joint Injection/Arthrocentesis  . Hand/Upper Extremity Injection/Arthrocentesis  . Hand/Upper Extremity Injection/Arthrocentesis  . XR Knee 1-2 Views Right   No orders of the defined types were placed in this encounter.     Procedures: Hand/UE Inj Date/Time: 03/16/2016 4:31 PM Performed by: Kathryne HitchBLACKMAN, Mell Guia Y Authorized by: Kathryne HitchBLACKMAN, Arzell Mcgeehan Y   Condition: trigger finger   Location:  Index finger Medications:  1 mL lidocaine 1 %; 40 mg methylPREDNISolone acetate 40 MG/ML Hand/UE Inj Date/Time: 03/16/2016 4:31 PM Performed by: Kathryne HitchBLACKMAN, Jonathon Tan Y Authorized by: Kathryne HitchBLACKMAN, Kane Kusek Y   Condition: trigger finger   Location:  Index finger Site:  L index A1 Medications:  1 mL lidocaine 1 %; 40 mg methylPREDNISolone acetate 40 MG/ML     Clinical  Data: No additional findings.   Subjective: Chief Complaint  Patient presents with  . Right Knee - Follow-up    HPI The patient had a right total knee replacement by another physician in town in 2011. She later had a left knee replacement. The left knee is always in well. The right knee has bothered her. She has had a synovectomy through an arthroscopic intervention and that did not help. She has daily pain and sometimes a catching sensation with that knee. A 3 phase bone scan was negative. She feels at this point she has tried and failed all forms of conservative treatment and does wish for at least a second opinion in regards to her right total knee replacement. While she is in the office she did request trigger finger injections in both index fingers. She's had a history of trigger injections and trigger releases of both her hands. Review of Systems She currently denies any chest pain, shortness of breath, fever, chills, nausea, vomiting.  Objective: Vital Signs: There were no vitals taken for this visit.  Physical Exam She is alert and oriented 3. She and relates without any assistive device. Ortho Exam Her right knee exam shows a well-healed incision. There is no significant warmth to the knee. There is no significant effusion I can tell. There is no redness. She does slightly hyperextend with that knee and has  good flexion. It feels ligament was a stable. It is slightly loose with drawer test. Specialty Comments:  No specialty comments available.  Imaging: Xr Knee 1-2 Views Right  Result Date: 03/16/2016 An AP lateral of her right knee shows a well-seated knee replacement. I see no lucency around the femoral or tibial components. There is no effusion or evidence of fracture.    PMFS History: Patient Active Problem List   Diagnosis Date Noted  . Diabetes (HCC) 01/08/2014  . Essential hypertension 01/08/2014  . Hyperlipidemia 01/08/2014  . Postoperative anemia due to acute  blood loss 11/22/2012  . Hyponatremia 11/06/2012  . OA (osteoarthritis) of knee 11/05/2012  . Villonodular synovitis of knee 04/20/2011   Past Medical History:  Diagnosis Date  . Anemia    hx of  . Arthritis   . Depression   . Diabetes mellitus ORAL MEDS  . GERD (gastroesophageal reflux disease)    occasional  . History of kidney stones   . Hyperlipidemia   . Hypertension   . Hypothyroidism   . Knee pain, right   . 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 OSA on CPAP   . Osteoarthritis   . Pinched nerve in neck   . Pneumonia    hx of  . PONV (postoperative nausea and vomiting)    for 3-4 days after general anesthesia  . Swelling of knee joint, right   . Synovitis of knee RIGHT    Family History  Problem Relation Age of Onset  . Diabetes Mother   . Hypertension Mother   . Heart attack Mother     Past Surgical History:  Procedure Laterality Date  . CESAREAN SECTION  X3  . KNEE ARTHROSCOPY  04/20/2011   Procedure: ARTHROSCOPY KNEE;  Surgeon: Loanne Drilling, MD;  Location: [facility];  Service: Orthopedics;  Laterality: Right;  WITH SYNOVECTOMY  . LEFT CARPAL TUNNEL / LEFT MIDDLE & RING FINGER TRIGGER RELEASE  08-26-2008  . LEFT SHOULDER ARTHROSCOPY W/ DEBRIDEMENT  09-09-2003  . LEFT SHOULDER ARTHROSCOPY/ LEFT THUMB TRIGGER RELEASE  02-22-2005  . PULLEY RELEASE LEFT LONG FINGER  07-14-2009  . RIGHT CARPAL TUNNEL/ RIGHT THUMB TRIGGER RELEASE'S  11-28-2006  . RIGHT SHOULDER ARTHROSCOPY W/ ROTATOR CUFF REPAIR  01-13-2004  . SHOULDER ARTHROSCOPY DISTAL CLAVICLE EXCISION AND OPEN ROTATOR CUFF REPAIR  09-07-2004   LEFT  . SHOULDER ARTHROSCOPY W/ ACROMIAL REPAIR  11-29-2005   LEFT  . TONSILLECTOMY AND ADENOIDECTOMY  child  . TOTAL KNEE ARTHROPLASTY  12-14-2009   RIGHT  . TOTAL KNEE ARTHROPLASTY Left 11/05/2012   Procedure: LEFT TOTAL KNEE ARTHROPLASTY;  Surgeon: Loanne Drilling, MD;  Location: [facility];  Service: Orthopedics;  Laterality: Left;  .  TRIGGER FINGER RELEASE Right 02/21/2013   Procedure: RIGHT RING A-1 PULLEY RELEASE    (MINOR PROCEDURE) ;  Surgeon: Wyn Forster., MD;  Location: [facility];  Service: Orthopedics;  Laterality: Right;   Social History   Occupational History  . Not on file.   Social History Main Topics  . Smoking status: Former Smoker    Years: 5.00    Types: Cigarettes    Quit date: 04/12/1977  . Smokeless tobacco: Never Used  . Alcohol use No  . Drug use: No  . Sexual activity: Not on file

## 2016-03-17 DIAGNOSIS — N390 Urinary tract infection, site not specified: Secondary | ICD-10-CM | POA: Diagnosis not present

## 2016-03-29 MED FILL — DULoxetine HCL 60 MG CPEP: 60 | 30 days supply | Qty: 30 | Fill #6

## 2016-04-01 ENCOUNTER — Telehealth (INDEPENDENT_AMBULATORY_CARE_PROVIDER_SITE_OTHER): Payer: Self-pay | Admitting: *Deleted

## 2016-04-01 NOTE — Telephone Encounter (Signed)
LMOM for patient that we were returning her call. She may call back if she has anymore questions

## 2016-04-01 NOTE — Telephone Encounter (Signed)
Pt called asking about recovery time for surgery and some other questions.

## 2016-04-04 ENCOUNTER — Telehealth (INDEPENDENT_AMBULATORY_CARE_PROVIDER_SITE_OTHER): Payer: Self-pay

## 2016-04-04 NOTE — Telephone Encounter (Signed)
We certainly can schedule this for May.  It would be still good to see her a few weeks before surgery just to answer any questions and to perform a quick physical exam as well as check on her general health right before then.  I'll give Sherrie a surgery shhet

## 2016-04-04 NOTE — Telephone Encounter (Signed)
Can you schedule an appt for her sometime maybe in April?

## 2016-04-04 NOTE — Telephone Encounter (Signed)
Patient called asking is she can schedule surgery in May for her knee or does she need to be seen first?

## 2016-04-05 MED FILL — VICTOZA 18 MG/3 ML INJECT P: 18 | 30 days supply | Qty: 9 | Fill #4

## 2016-04-05 NOTE — Telephone Encounter (Signed)
Called patient left message on voicemail to return call to schedule an appointment with Dr. Magnus IvanBlackman prior to surgery   Ph# [phone]

## 2016-04-06 MED FILL — HUMALOG 100 UNITS/ML KWIKPE: 100 | 90 days supply | Qty: 18 | Fill #0

## 2016-04-14 ENCOUNTER — Ambulatory Visit (INDEPENDENT_AMBULATORY_CARE_PROVIDER_SITE_OTHER): Payer: 59 | Admitting: Orthopaedic Surgery

## 2016-04-14 DIAGNOSIS — T84068D Wear of articular bearing surface of other internal prosthetic joint, subsequent encounter: Secondary | ICD-10-CM

## 2016-04-14 DIAGNOSIS — T84062D Wear of articular bearing surface of internal prosthetic right knee joint, subsequent encounter: Secondary | ICD-10-CM

## 2016-04-14 DIAGNOSIS — Z96659 Presence of unspecified artificial knee joint: Secondary | ICD-10-CM

## 2016-04-14 DIAGNOSIS — T84062A Wear of articular bearing surface of internal prosthetic right knee joint, initial encounter: Secondary | ICD-10-CM | POA: Insufficient documentation

## 2016-04-14 NOTE — Progress Notes (Signed)
The patient is well-known to me. She has a right total knee arthroplasty that was done elsewhere in town. She feels like it is unstable to her not agree with the [place] physical exam. Her knee does slightly hyperextend. With the knee in a flexed position about 30 even varus and valgus stressing show some instability. This is slowly happen with time. X-rays of show an that the implant looks well seated. We talked in detail about best case scenario being a polyliner exchange to a thicker poly-versus the potential for a full revision. We a long discussion and answered all of her questions. We talked in detail the risk and benefits of this as well as what her intraoperative and postoperative courses be as well as her recovery. We would see her back in 2 weeks postoperative no x-rays of be needed.

## 2016-04-26 MED FILL — DULoxetine HCL 60 MG CPEP: 60 | 30 days supply | Qty: 30 | Fill #7

## 2016-04-26 MED FILL — OMEPRAZOLE 20 MG CAPSULE DR: 20 | 90 days supply | Qty: 90 | Fill #2

## 2016-04-26 MED FILL — VALSARTAN-HCTZ 320-12.5 MG: 320-12.5 | 30 days supply | Qty: 30 | Fill #1

## 2016-04-26 MED FILL — METFORMIN HCL ER 500 MG TAB: 500 | 90 days supply | Qty: 180 | Fill #1

## 2016-04-26 MED FILL — LEVOTHYROXINE 100 MCG TAB: 100 | 90 days supply | Qty: 90 | Fill #1

## 2016-04-27 MED FILL — AMLODIPINE BESYLATE 5 MG TA: 5 | 90 days supply | Qty: 90 | Fill #0

## 2016-05-09 ENCOUNTER — Encounter: Payer: Self-pay | Admitting: Pharmacist

## 2016-05-09 ENCOUNTER — Other Ambulatory Visit: Payer: Self-pay | Admitting: Pharmacist

## 2016-05-09 VITALS — BP 132/75 | HR 82 | Wt 255.0 lb

## 2016-05-09 DIAGNOSIS — Z794 Long term (current) use of insulin: Principal | ICD-10-CM

## 2016-05-09 DIAGNOSIS — E1165 Type 2 diabetes mellitus with hyperglycemia: Secondary | ICD-10-CM

## 2016-05-09 NOTE — Patient Outreach (Addendum)
[employer] [facility]) Care Management  05/09/2016  [name] June 20, 1950 [id number]   Subjective: Patient presents today for 3 month diabetes follow-up as part of the employer-sponsored Link to Wellness program.  Current diabetes regimen includes lantus 60 units  BID, humalog sliding scale 16-20 units with supper, metformin 1000 mg daily, victoza 1.8 mg daily.  Patient also continues on daily aspirin, atorvastatin.  Patient has a pending appt for 08/11/16 with Dr. Magnus Ivan for knee pain.  No med changes at this time. Per patient, takes valsartan-HCTZ qHS. Of note, patient received cortisol shots into her hands since our last visit. Per patient, this caused a marked elevation in blood sugars which resolved ~3 weeks after the injections.   Patient states that Dr. Sharl Ma mentioned possibility of bariatric surgery but expressed hesitancy about aggressive measures to control weight. Per patient, she is feeling very discouraged about her slow, steady weight gain.   Patient reported dietary habits: Eats 3 meals/day Breakfast: light and fit yogurt + oatmeal packet mixed in Lunch: sandwich, fruits, veggies - packs her own lunches Dinner: chicken - usually pan fried or sauteed, frozen vegetables, baked potatoes Snacks: crackers and cheese, apples, grapes Drinks:water  Patient reported exercise habits:   Patient denies hypoglycemic events.  Patient reports nocturia .  Patient denies pain/burning upon urination.  Patient denies neuropathy. Patient denies visual changes. Patient reports self foot exams.   Patient reported self monitored blood glucose frequency: 2 times daily Home fasting CBG: 100-130s  2 hour post-prandial/random CBG: 160-180s  Objective:  Lab Results  Component Value Date   HGBA1C 7.9 09/18/2014  Per patient report - Last A1C = 8.5   Vitals:   05/09/16 1554  BP: 132/75  Pulse: 82  Weight = 255 lbs   Assessment:  Diabetes: Most recent A1C was 8.5% per  patient report which is above goal of less than 7%. Weight is up from last visit with me. Diabetes control is suboptimal due to dietary indiscretion. Of note, patient's insulin regimen is heavy on the basal side. Patient seems to run high before her evening meal + prandial humalog. Recommend working to decrease basal requirement by adding meal time coverage for lunch.   Physical Activity- decreased since last visit with me, limited by her knee pain  Nutrition- Improved meal planning, planning 4d/week.    Plan/Goals for Next Visit: Work on meal planning, find information about bariatric surgery, swap out a low-sugar granola for oatmeal packets with breakfast yogurt.  Counseled patient to take valsartan-HCTZ in AM and move amlodipine to PM to avoid nocturia.   Allena Katz, Pharm.D. PGY1 Pharmacy Resident 2/27/20189:53 AM Pager [phone]   [facility] CM Care Plan Problem One   Flowsheet Row Most Recent Value  Care Plan Problem One  Poorly controlled T2DM  Role Documenting the Problem One  Clinical Pharmacist  Care Plan for Problem One  Active  [facility] Long Term Goal Start Date  05/09/16  [facility] CM Short Term Goal #1 (0-30 days)  Patient will learn more about weight loss options by reading information on bariatric surgery (per request of PCP as reported by patient) and scheduling  an appointment with a doctor to talk about weight loss options in the next 90 days  [facility] CM Short Term Goal #1 Start Date  05/09/16  Interventions for Short Term Goal #1  Counseled patient on importance of weight loss, encouraged her to read on her own and make an appointment  [facility] CM Short Term Goal #2 (0-30  days)  Patient will increase dietary compliance over th next 90 days by planning meals for at least 6 days/week  [facility] CM Short Term Goal #2 Start Date  05/09/16  Interventions for Short Term Goal #2  Counseled on importance of dietary compliance for control of diabetes and weight  [facility] CM Short Term Goal #3 (0-30 days)   Patient will make informed decisions about food choices over the next 30 days by reading the nutrition labels on foods to compare sugar content and choose the lowest one.   [facility] CM Short Term Goal #3 Start Date  05/09/16  Interventions for Short Tern Goal #3  Patient has been adding oatmeal packets to yogurt in AM, suggested granola with lower sugar content instead    [facility] CM Care Plan Problem Two   Flowsheet Row Most Recent Value  Care Plan Problem Two  Obesity  Role Documenting the Problem Two  Clinical Pharmacist  Care Plan for Problem Two  Active  Interventions for Problem Two Long Term Goal   Discussed meal planning, healthy substitutions for common recipes, limiting sweets to 1 sweet/day  [facility] Long Term Goal (31-90) days  Patient will maintain or lose weight over next 90 days by increaseing dietary compliance as outlined in Care Plan Problem One  [facility] Start Date  02/01/16     \

## 2016-05-10 ENCOUNTER — Encounter: Payer: Self-pay | Admitting: Pharmacist

## 2016-05-11 MED FILL — VICTOZA 18 MG/3 ML INJECT P: 18 | 30 days supply | Qty: 9 | Fill #5

## 2016-05-16 ENCOUNTER — Ambulatory Visit
Admission: RE | Admit: 2016-05-16 | Discharge: 2016-05-16 | Disposition: A | Discharge status: Home / Self Care | Payer: 59 | Source: Ambulatory Visit | Attending: Nurse Practitioner | Admitting: Nurse Practitioner

## 2016-05-16 ENCOUNTER — Other Ambulatory Visit: Payer: Self-pay | Admitting: Nurse Practitioner

## 2016-05-16 DIAGNOSIS — R05 Cough: Secondary | ICD-10-CM

## 2016-05-16 DIAGNOSIS — R0602 Shortness of breath: Secondary | ICD-10-CM | POA: Diagnosis not present

## 2016-05-16 DIAGNOSIS — R059 Cough, unspecified: Secondary | ICD-10-CM

## 2016-05-16 MED FILL — BENZONATATE 200 MG CAP: 200 | 10 days supply | Qty: 30 | Fill #0

## 2016-05-16 MED FILL — ADVAIR 250/50 DISKUS: 250-50 | 30 days supply | Qty: 60 | Fill #0

## 2016-05-19 ENCOUNTER — Other Ambulatory Visit: Payer: Self-pay | Admitting: Nurse Practitioner

## 2016-05-19 DIAGNOSIS — R0602 Shortness of breath: Secondary | ICD-10-CM

## 2016-05-26 MED FILL — LANTUS 100 UNITS/ML VIAL: 100 | 88 days supply | Qty: 80 | Fill #2

## 2016-05-30 DIAGNOSIS — E113299 Type 2 diabetes mellitus with mild nonproliferative diabetic retinopathy without macular edema, unspecified eye: Secondary | ICD-10-CM | POA: Diagnosis not present

## 2016-05-30 DIAGNOSIS — Z5181 Encounter for therapeutic drug level monitoring: Secondary | ICD-10-CM | POA: Diagnosis not present

## 2016-05-30 DIAGNOSIS — Z794 Long term (current) use of insulin: Secondary | ICD-10-CM | POA: Diagnosis not present

## 2016-05-30 DIAGNOSIS — Z8639 Personal history of other endocrine, nutritional and metabolic disease: Secondary | ICD-10-CM | POA: Diagnosis not present

## 2016-05-30 DIAGNOSIS — E1165 Type 2 diabetes mellitus with hyperglycemia: Secondary | ICD-10-CM | POA: Diagnosis not present

## 2016-06-01 MED FILL — DULoxetine HCL 60 MG CPEP: 60 | 30 days supply | Qty: 30 | Fill #0

## 2016-06-03 ENCOUNTER — Other Ambulatory Visit (HOSPITAL_COMMUNITY): Payer: 59

## 2016-06-06 ENCOUNTER — Ambulatory Visit (HOSPITAL_COMMUNITY): Payer: 59 | Attending: Cardiology

## 2016-06-06 ENCOUNTER — Other Ambulatory Visit: Payer: Self-pay

## 2016-06-06 DIAGNOSIS — I10 Essential (primary) hypertension: Secondary | ICD-10-CM | POA: Insufficient documentation

## 2016-06-06 DIAGNOSIS — R0602 Shortness of breath: Secondary | ICD-10-CM | POA: Diagnosis not present

## 2016-06-06 DIAGNOSIS — E119 Type 2 diabetes mellitus without complications: Secondary | ICD-10-CM | POA: Insufficient documentation

## 2016-06-06 DIAGNOSIS — Z6841 Body Mass Index (BMI) 40.0 and over, adult: Secondary | ICD-10-CM | POA: Diagnosis not present

## 2016-06-06 DIAGNOSIS — E669 Obesity, unspecified: Secondary | ICD-10-CM | POA: Diagnosis not present

## 2016-06-06 DIAGNOSIS — E785 Hyperlipidemia, unspecified: Secondary | ICD-10-CM | POA: Insufficient documentation

## 2016-06-10 MED FILL — VICTOZA 18 MG/3 ML INJECT P: 18 | 30 days supply | Qty: 9 | Fill #6

## 2016-06-10 MED FILL — BENZONATATE 200 MG CAP: 200 | 10 days supply | Qty: 30 | Fill #1

## 2016-06-22 MED FILL — ATORVASTATIN 40 MG TABLET: 40 | 90 days supply | Qty: 90 | Fill #0

## 2016-06-22 MED FILL — VALSARTAN-HCTZ 320-12.5 MG: 320-12.5 | 30 days supply | Qty: 30 | Fill #2

## 2016-06-23 MED FILL — METHOCARBAMOL 500 MG TABLET: 500 | 14 days supply | Qty: 40 | Fill #0

## 2016-06-24 DIAGNOSIS — R002 Palpitations: Secondary | ICD-10-CM | POA: Diagnosis not present

## 2016-06-24 DIAGNOSIS — M109 Gout, unspecified: Secondary | ICD-10-CM | POA: Diagnosis not present

## 2016-06-24 DIAGNOSIS — R05 Cough: Secondary | ICD-10-CM | POA: Diagnosis not present

## 2016-06-24 DIAGNOSIS — R0602 Shortness of breath: Secondary | ICD-10-CM | POA: Diagnosis not present

## 2016-06-24 DIAGNOSIS — M179 Osteoarthritis of knee, unspecified: Secondary | ICD-10-CM | POA: Diagnosis not present

## 2016-06-24 MED FILL — INDOMETHACIN 50 MG CAPSULE: 50 | 6 days supply | Qty: 20 | Fill #0

## 2016-06-28 ENCOUNTER — Encounter: Payer: Self-pay | Admitting: Cardiovascular Disease

## 2016-06-28 NOTE — Progress Notes (Signed)
Cardiology Office Note   Date:  06/30/2016   ID:  [name], DOB [birth year], MRN [id number]  PCP:  Pearla Dubonnet, MD  Cardiologist:   Charlton Haws, MD   Chief Complaint  Patient presents with  . Establish Care  . Palpitations      History of Present Illness: [name] is a 65 y.o. female who presents for evaluation/consultation palpitations. Referred by Dr Kevan Ny.   Reviewed office notes from Dr Kevan Ny 06/24/16  Dyspnea with cough Stopped advair and given Benzonatate Capsule Concerned about palpitations ? PAF   Echo done 06/06/16 was normal with EF 65% and normal PA pressure no valve disease She has had cough since January CXR ok On valsartan OSA wears CPAP with no issues. Having redo right knee surgery with Dr Rayburn Ma in May. Original Right TKR done by [facility].    Palpitations since January. Mostly at night Fast skips no associated chest pain, dyspnea or syncope Happening more often almost every night now   Sees Dr Sharl Ma for thyroid and DM A1c still up around 7.7 she says thyroid has been fine    Past Medical History:  Diagnosis Date  . Anemia    hx of  . Arthritis   . Depression   . Diabetes mellitus ORAL MEDS  . GERD (gastroesophageal reflux disease)    occasional  . History of kidney stones   . Hyperlipidemia   . Hypertension   . Hypothyroidism   . Knee pain, right   . 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 OSA on CPAP   . Osteoarthritis   . Pinched nerve in neck   . Pneumonia    hx of  . PONV (postoperative nausea and vomiting)    for 3-4 days after general anesthesia  . Swelling of knee joint, right   . Synovitis of knee RIGHT    Past Surgical History:  Procedure Laterality Date  . CESAREAN SECTION  X3  . KNEE ARTHROSCOPY  04/20/2011   Procedure: ARTHROSCOPY KNEE;  Surgeon: Loanne Drilling, MD;  Location: [facility];  Service: Orthopedics;  Laterality: Right;  WITH SYNOVECTOMY  . LEFT CARPAL TUNNEL / LEFT MIDDLE &  RING FINGER TRIGGER RELEASE  08-26-2008  . LEFT SHOULDER ARTHROSCOPY W/ DEBRIDEMENT  09-09-2003  . LEFT SHOULDER ARTHROSCOPY/ LEFT THUMB TRIGGER RELEASE  02-22-2005  . PULLEY RELEASE LEFT LONG FINGER  07-14-2009  . RIGHT CARPAL TUNNEL/ RIGHT THUMB TRIGGER RELEASE'S  11-28-2006  . RIGHT SHOULDER ARTHROSCOPY W/ ROTATOR CUFF REPAIR  01-13-2004  . SHOULDER ARTHROSCOPY DISTAL CLAVICLE EXCISION AND OPEN ROTATOR CUFF REPAIR  09-07-2004   LEFT  . SHOULDER ARTHROSCOPY W/ ACROMIAL REPAIR  11-29-2005   LEFT  . TONSILLECTOMY AND ADENOIDECTOMY  child  . TOTAL KNEE ARTHROPLASTY  12-14-2009   RIGHT  . TOTAL KNEE ARTHROPLASTY Left 11/05/2012   Procedure: LEFT TOTAL KNEE ARTHROPLASTY;  Surgeon: Loanne Drilling, MD;  Location: [facility];  Service: Orthopedics;  Laterality: Left;  . TRIGGER FINGER RELEASE Right 02/21/2013   Procedure: RIGHT RING A-1 PULLEY RELEASE    (MINOR PROCEDURE) ;  Surgeon: Wyn Forster., MD;  Location: [facility];  Service: Orthopedics;  Laterality: Right;     Current Outpatient Prescriptions  Medication Sig Dispense Refill  . amLODipine (NORVASC) 5 MG tablet Take 5 mg by mouth daily.     [name] aspirin EC 81 MG tablet Take 81 mg by mouth daily.    [name]  atorvastatin (LIPITOR) 40 MG tablet Take 40 mg by mouth daily.    . cholecalciferol (VITAMIN D) 1000 units tablet Take 1,000 Units by mouth daily.    . DULoxetine (CYMBALTA) 60 MG capsule Take 60 mg by mouth daily.    . indomethacin (INDOCIN) 25 MG capsule Take 25 mg by mouth 2 (two) times daily with a meal. Take for 3 days (5 day supply provided)    . insulin glargine (LANTUS) 100 UNIT/ML injection Inject 60 Units into the skin 2 (two) times daily.     . insulin lispro (HUMALOG) 100 UNIT/ML injection Inject 20 Units into the skin daily before supper.     . levothyroxine (SYNTHROID, LEVOTHROID) 100 MCG tablet Take 100 mcg by mouth daily.  2  . Liraglutide (VICTOZA) 18 MG/3ML SOLN Inject 1.8 mg into the skin every morning.      . Melatonin 5 MG CAPS Take 5 mg by mouth at bedtime as needed.    . metFORMIN (GLUCOPHAGE) 500 MG tablet Take 1,000 mg by mouth every evening.     . methocarbamol (ROBAXIN) 500 MG tablet Take 500 mg by mouth 3 (three) times daily as needed for muscle spasms.    [name] omeprazole (PRILOSEC) 20 MG capsule Take 20 mg by mouth daily.    . valsartan-hydrochlorothiazide (DIOVAN-HCT) 320-25 MG per tablet Take 0.5 tablets by mouth every evening.      No current facility-administered medications for this visit.     Allergies:   Actos [pioglitazone hydrochloride]; Avandia [rosiglitazone]; Tetanus toxoids; and Food    Social History:  The patient  reports that she quit smoking about 39 years ago. Her smoking use included Cigarettes. She quit after 5.00 years of use. She has never used smokeless tobacco. She reports that she does not drink alcohol or use drugs.   Family History:  The patient's family history includes Diabetes in her mother; Heart attack in her mother; Hypertension in her mother.    ROS:  Please see the history of present illness.   Otherwise, review of systems are positive for none.   All other systems are reviewed and negative.    PHYSICAL EXAM: VS:  BP 130/76   Pulse 78   Ht  (1.575 m)   Wt 258 lb (117 kg)   SpO2 98%   BMI 47.19 kg/m  , BMI Body mass index is 47.19 kg/m. Affect appropriate Obese white female  HEENT: normal Neck supple with no adenopathy JVP normal no bruits no thyromegaly Lungs clear with no wheezing and good diaphragmatic motion Heart:  S1/S2 no murmur, no rub, gallop or click PMI normal Abdomen: benighn, BS positve, no tenderness, no AAA no bruit.  No HSM or HJR Distal pulses intact with no bruits No edema Neuro non-focal Skin warm and dry Right TKR     EKG:   06/30/16 SR rate 70 nonspecific ST changes QT normal 412    Recent Labs: No results found for requested labs within last 8760 hours.    Lipid Panel No results found for: CHOL,  TRIG, HDL, CHOLHDL, VLDL, LDLCALC, LDLDIRECT    Wt Readings from Last 3 Encounters:  06/30/16 258 lb (117 kg)  05/09/16 255 lb (115.7 kg)  02/01/16 256 lb 9.6 oz (116.4 kg)      Other studies Reviewed: Additional studies/ records that were reviewed today include: Notes Dr Kevan Ny labs and Echo done in March.    ASSESSMENT AND PLAN:  1. Palpitations:  Event monitor to r/o PAF echo  is ok with normal LA size and she is compliant with CPAP Making PAF less likely  2. DM:  Discussed low carb diet.  Target hemoglobin A1c is 6.5 or less.  Continue current medications. f/u Dr Sharl Ma 3. HTN:  Well controlled.  Continue current medications and low sodium Dash type diet.   Consider changing valsartan to see if cough improves 4. Preop:  Clear to have knee surgery with Dr Rayburn Ma no chest pain echo normal  5. Chol  Continue statin labs with primary  6. OSA:  Continue CPAP discussed benefits of weight loss   Current medicines are reviewed at length with the patient today.  The patient does not have concerns regarding medicines.  The following changes have been made:  no change  Labs/ tests ordered today include: Event monitor   Orders Placed This Encounter  Procedures  . Cardiac event monitor  . EKG 12-Lead     Disposition:   FU with me in 3 months      Signed, Charlton Haws, MD  06/30/2016 4:10 PM    [facility] [address] [place], [place], [place]  [address] Phone: [phone]; Fax: [phone]

## 2016-06-30 ENCOUNTER — Ambulatory Visit: Payer: 59 | Admitting: Cardiovascular Disease

## 2016-06-30 ENCOUNTER — Encounter: Payer: Self-pay | Admitting: Cardiovascular Disease

## 2016-06-30 VITALS — BP 130/76 | HR 78 | Ht 62.0 in | Wt 258.0 lb

## 2016-06-30 DIAGNOSIS — R002 Palpitations: Secondary | ICD-10-CM | POA: Diagnosis not present

## 2016-06-30 DIAGNOSIS — Z7689 Persons encountering health services in other specified circumstances: Secondary | ICD-10-CM | POA: Diagnosis not present

## 2016-06-30 DIAGNOSIS — I10 Essential (primary) hypertension: Secondary | ICD-10-CM

## 2016-06-30 NOTE — Patient Instructions (Signed)
Medication Instructions:  Your physician recommends that you continue on your current medications as directed. Please refer to the Current Medication list given to you today.  Labwork: NONE  Testing/Procedures: Your physician has recommended that you wear an event monitor. Event monitors are medical devices that record the heart's electrical activity. Doctors most often us these monitors to diagnose arrhythmias. Arrhythmias are problems with the speed or rhythm of the heartbeat. The monitor is a small, portable device. You can wear one while you do your normal daily activities. This is usually used to diagnose what is causing palpitations/syncope (passing out).  Follow-Up: Your physician wants you to follow-up in: 3 months with Dr. Nishan.    If you need a refill on your cardiac medications before your next appointment, please call your pharmacy.    

## 2016-07-01 MED FILL — HUMALOG 100 UNITS/ML KWIKPE: 100 | 90 days supply | Qty: 18 | Fill #1

## 2016-07-01 MED FILL — DULoxetine HCL 60 MG CPEP: 60 | 30 days supply | Qty: 30 | Fill #1

## 2016-07-06 ENCOUNTER — Ambulatory Visit (INDEPENDENT_AMBULATORY_CARE_PROVIDER_SITE_OTHER): Payer: 59

## 2016-07-06 DIAGNOSIS — Z7689 Persons encountering health services in other specified circumstances: Secondary | ICD-10-CM

## 2016-07-06 DIAGNOSIS — R002 Palpitations: Secondary | ICD-10-CM | POA: Diagnosis not present

## 2016-07-06 DIAGNOSIS — I10 Essential (primary) hypertension: Secondary | ICD-10-CM

## 2016-07-07 ENCOUNTER — Telehealth: Payer: Self-pay | Admitting: Cardiovascular Disease

## 2016-07-07 ENCOUNTER — Encounter: Payer: Self-pay | Admitting: *Deleted

## 2016-07-07 DIAGNOSIS — I480 Paroxysmal atrial fibrillation: Secondary | ICD-10-CM

## 2016-07-07 MED ORDER — RIVAROXABAN 20 MG PO TABS: 20.0000 mg | ORAL_TABLET | Freq: Every day | ORAL | 0 refills | Status: DC

## 2016-07-07 MED ORDER — FLECAINIDE ACETATE 50 MG PO TABS: 50.0000 mg | ORAL_TABLET | Freq: Two times a day (BID) | ORAL | 3 refills | Status: DC

## 2016-07-07 MED ORDER — METOPROLOL TARTRATE 25 MG PO TABS: 25.0000 mg | ORAL_TABLET | Freq: Two times a day (BID) | ORAL | 3 refills | Status: DC

## 2016-07-07 MED FILL — FLECAINIDE ACETATE 50 MG TA: 50 | 90 days supply | Qty: 180 | Fill #0

## 2016-07-07 MED FILL — METOPROLOL TARTRATE 25 MG T: 25 | 90 days supply | Qty: 180 | Fill #0

## 2016-07-07 NOTE — Telephone Encounter (Signed)
Event monitor report received from [facility]: 07/06/16 9:33 pm (CT)- "A-fib w/ RVR" noted- HR ~130 bpm.  Reviewed with Dr. Eden Emms- orders received to: 1) Start Xarelto 20 mg- take one tablet by mouth QD 2) Start lopressor 25 mg- take one tablet by mouth BID 3) Start flecainide 50 mg- take one tablet by mouth BID 4) Lexiscan myoview in 2 weeks 5) needs to stop xarelto 5/15- 3 days prior to knee surgery  I left a message for the patient to call.

## 2016-07-07 NOTE — Telephone Encounter (Signed)
I spoke with the patient. She is aware of her monitor readings and Dr. Fabio Bering recommendations for lopressor, flecainide, xarelto, and a lexiscan. We have discussed the role of each medication in regards to her diagnosis and reasoning for lexiscan.  She is agreeable with the above. She is aware that we have pulled samples of Xarelto 20 mg to start her on. She will come by the office tomorrow to pick these up. She is aware that scheduling will call her to arrange her myoview.

## 2016-07-11 DIAGNOSIS — R0609 Other forms of dyspnea: Secondary | ICD-10-CM | POA: Diagnosis not present

## 2016-07-11 DIAGNOSIS — M179 Osteoarthritis of knee, unspecified: Secondary | ICD-10-CM | POA: Diagnosis not present

## 2016-07-11 DIAGNOSIS — R5383 Other fatigue: Secondary | ICD-10-CM | POA: Diagnosis not present

## 2016-07-11 DIAGNOSIS — R0602 Shortness of breath: Secondary | ICD-10-CM | POA: Diagnosis not present

## 2016-07-11 DIAGNOSIS — M791 Myalgia: Secondary | ICD-10-CM | POA: Diagnosis not present

## 2016-07-11 DIAGNOSIS — R002 Palpitations: Secondary | ICD-10-CM | POA: Diagnosis not present

## 2016-07-11 DIAGNOSIS — M109 Gout, unspecified: Secondary | ICD-10-CM | POA: Diagnosis not present

## 2016-07-11 DIAGNOSIS — E113299 Type 2 diabetes mellitus with mild nonproliferative diabetic retinopathy without macular edema, unspecified eye: Secondary | ICD-10-CM | POA: Diagnosis not present

## 2016-07-11 DIAGNOSIS — R05 Cough: Secondary | ICD-10-CM | POA: Diagnosis not present

## 2016-07-12 ENCOUNTER — Other Ambulatory Visit (INDEPENDENT_AMBULATORY_CARE_PROVIDER_SITE_OTHER): Payer: Self-pay | Admitting: Physician Assistant

## 2016-07-14 ENCOUNTER — Telehealth (HOSPITAL_COMMUNITY): Payer: Self-pay | Admitting: *Deleted

## 2016-07-14 MED FILL — VICTOZA 18 MG/3 ML INJECT P: 18 | 30 days supply | Qty: 9 | Fill #7

## 2016-07-14 NOTE — Telephone Encounter (Signed)
Patient given detailed instructions per Myocardial Perfusion Study Information Sheet for the test on 07/18/16. Patient notified to arrive 15 minutes early and that it is imperative to arrive on time for appointment to keep from having the test rescheduled.  If you need to cancel or reschedule your appointment, please call the office within 24 hours of your appointment. Failure to do so may result in a cancellation of your appointment, and a $50 no show fee. Patient verbalized understanding. [name]    

## 2016-07-14 NOTE — Telephone Encounter (Signed)
Left message on voicemail in reference to upcoming appointment scheduled for 07/18/16. Phone number given for a call back so details instructions can be given.  Ayyub Krall Jacqueline   

## 2016-07-15 MED FILL — FREESTYLE LITE TEST STRIP: 90 days supply | Qty: 300 | Fill #0

## 2016-07-18 ENCOUNTER — Encounter: Payer: Self-pay | Admitting: Pharmacist

## 2016-07-18 ENCOUNTER — Other Ambulatory Visit: Payer: Self-pay | Admitting: Pharmacist

## 2016-07-18 ENCOUNTER — Ambulatory Visit (HOSPITAL_COMMUNITY): Payer: 59 | Attending: Cardiology

## 2016-07-18 DIAGNOSIS — R0789 Other chest pain: Secondary | ICD-10-CM | POA: Insufficient documentation

## 2016-07-18 DIAGNOSIS — I480 Paroxysmal atrial fibrillation: Secondary | ICD-10-CM | POA: Diagnosis not present

## 2016-07-18 DIAGNOSIS — R0609 Other forms of dyspnea: Secondary | ICD-10-CM | POA: Insufficient documentation

## 2016-07-18 DIAGNOSIS — I10 Essential (primary) hypertension: Secondary | ICD-10-CM | POA: Diagnosis not present

## 2016-07-18 DIAGNOSIS — I4891 Unspecified atrial fibrillation: Secondary | ICD-10-CM | POA: Insufficient documentation

## 2016-07-18 DIAGNOSIS — R002 Palpitations: Secondary | ICD-10-CM | POA: Insufficient documentation

## 2016-07-18 DIAGNOSIS — E119 Type 2 diabetes mellitus without complications: Secondary | ICD-10-CM | POA: Diagnosis not present

## 2016-07-18 MED ORDER — REGADENOSON 0.4 MG/5ML IV SOLN
0.4000 mg | Freq: Once | INTRAVENOUS | Status: AC
  Administered 2016-07-18: 0.4 mg via INTRAVENOUS

## 2016-07-18 MED ORDER — TECHNETIUM TC 99M SESTAMIBI GENERIC - CARDIOLITE
33.0000 | Freq: Once | INTRAVENOUS | Status: AC | PRN
  Administered 2016-07-18: 33 via INTRAVENOUS
  Filled 2016-07-18: qty 33

## 2016-07-18 NOTE — Patient Outreach (Signed)
[name]) Care Management  [place]  07/18/2016   [name] 26-Oct-1950 [id number]  Subjective: Patient presents today for 3 month follow up month diabetes follow-up as part of the employer-sponsored [employer] to Wellness program. She is discouraged about new health problems and weight gain.   Most recent MD follow-up was 05/30/16 with Dr. Buddy Duty (endo), last A1C 7.7 per patient. Current diabetes regimen includes lantus 60 units BID, humalog 20 units qsupper if CBG >140, metformin 1000 mg qHS, victoza 1.8 mg nightly.  Patient also continues on valsartan/HCTZ and atorvastatin.    Saw Dr. Johnsie Cancel 06/30/16 (cardiology) to establish care and cc: palpitations, SOB. Since then she has had a negative ECHO, dx PAF - wearing 30d monitor to look for other cardiac abnormalities. Patient was started on xarelto, metoprolol, flecanide. Aspirin and advair were stopped. Med list updated. [name] two minor gout flares since our last visit, only taking indomethacin for this PRN. Patient has upcoming knee surgery 07/29/16  Patient reported dietary habits: Eats 3 meals/day Breakfast: light and fit yogurt or 2 scrambled eggs  - cut out oatmeal Lunch: sandwich, fruits, veggies - packs her own lunches Dinner: chicken - usually pan fried or sauteed, frozen vegetables, baked potatoes, [place] kielbasa, sauerkraut  Snacks: crackers and cheese, apples, grapes Drinks:water  Patient reported exercise habits: none, limited by increased SOB  Patient reports hypoglycemic events. one to 59 overnight several weeks ago, explained by the fact that she took humalog with supper even though her CBG was <140 Patient reports nocturia, less frequent than before.  Patient denies pain/burning upon urination.  Patient denies neuropathy. Patient denies visual changes. Patient reports self foot exams. No issues  Patient reported self monitored blood glucose frequency twice daily Home fasting CBG:  90s-100s Pre-supper CBG: 140-170 - has not been giving her humalog with supper as often.   Objective:  Last A1C = 7.7 per patient Vitals:   07/18/16 1500 07/18/16 1632  BP: (!) 174/78 (!) 161/81  Weight increased to 263 lbs   Assessment:  Diabetes: Most recent A1C was 7.7% which is slightly above goal of less than 7%. Weight is up from last visit with me. Exercise has reduced to nothing, limited by knee pain and SOB  Nutrition- improved evidenced by decreased carb intake in 24-hr diet recall and reduction in A1C.    Plan/Goals for Next Visit:    Next appointment to see me is: October 21, 2016   [name], [place].D. PGY1 Pharmacy Resident 5/7/20184:38 PM Pager [phone]  [facility] CM Care Plan Problem One     Most Recent Value  Care Plan Problem One  Poorly controlled T2DM  Role Documenting the Problem One  Clinical Pharmacist  Care Plan for Problem One  Active  [facility] Long Term Goal Start Date  05/09/16  [facility] CM Short Term Goal #1 (0-30 days)  Patient will learn more about weight loss options by reading information on bariatric surgery (per request of PCP as reported by patient) and scheduling  an appointment with a doctor to talk about weight loss options in the next 90 days  [facility] CM Short Term Goal #1 Start Date  05/09/16  [name] CM Short Term Goal #1 Met Date  07/18/16  [facility] CM Short Term Goal #2 (0-30 days)  Patient will increase dietary compliance over th next 90 days by planning meals for at least 6 days/week  [facility] CM Short Term Goal #2 Start Date  05/09/16  [facility]  CM Short Term Goal #2 Met Date  07/18/16  [facility] CM Short Term Goal #3 (0-30 days)  Patient will make informed decisions about food choices over the next 30 days by reading the nutrition labels on foods to compare sugar content and choose the lowest one.   [facility] CM Short Term Goal #3 Start Date  05/09/16  [facility] CM Short Term Goal #3 Met Date  07/18/16  Interventions for Short Tern Goal #3  congratulated on improved  glycemic control    [facility] CM Care Plan Problem Two     Most Recent Value  Care Plan Problem Two  Obesity  Role Documenting the Problem Two  Clinical Pharmacist  Care Plan for Problem Two  Active  Interventions for Problem Two Long Term Goal   Continued encouraging patient to make healthy meal choices  [facility] Long Term Goal (31-90) days  Patient will maintain or lose weight over next 90 days by increaseing dietary compliance as outlined in Care Plan Problem One  [name] Long Term Goal Start Date  02/01/16

## 2016-07-19 ENCOUNTER — Encounter (HOSPITAL_COMMUNITY): Payer: 59

## 2016-07-19 MED FILL — XARELTO 20 MG TABLET: 20 | 30 days supply | Qty: 30 | Fill #0

## 2016-07-19 NOTE — Progress Notes (Signed)
06-13-16 (EPIC) EKG 06-06-16 (EPIC) ECHO 05-17-15 (EPIC) CXR

## 2016-07-20 ENCOUNTER — Other Ambulatory Visit (HOSPITAL_COMMUNITY): Payer: 59

## 2016-07-20 ENCOUNTER — Encounter (HOSPITAL_COMMUNITY): Payer: Self-pay

## 2016-07-20 ENCOUNTER — Encounter (HOSPITAL_COMMUNITY)
Admission: RE | Admit: 2016-07-20 | Discharge: 2016-07-20 | Disposition: A | Discharge status: Home / Self Care | Payer: 59 | Source: Ambulatory Visit | Attending: Orthopaedic Surgery | Admitting: Orthopaedic Surgery

## 2016-07-20 DIAGNOSIS — Z0183 Encounter for blood typing: Secondary | ICD-10-CM | POA: Insufficient documentation

## 2016-07-20 DIAGNOSIS — Z96651 Presence of right artificial knee joint: Secondary | ICD-10-CM | POA: Insufficient documentation

## 2016-07-20 DIAGNOSIS — T8484XA Pain due to internal orthopedic prosthetic devices, implants and grafts, initial encounter: Secondary | ICD-10-CM | POA: Insufficient documentation

## 2016-07-20 DIAGNOSIS — M25561 Pain in right knee: Secondary | ICD-10-CM | POA: Diagnosis not present

## 2016-07-20 DIAGNOSIS — Y838 Other surgical procedures as the cause of abnormal reaction of the patient, or of later complication, without mention of misadventure at the time of the procedure: Secondary | ICD-10-CM | POA: Insufficient documentation

## 2016-07-20 DIAGNOSIS — Z01812 Encounter for preprocedural laboratory examination: Secondary | ICD-10-CM | POA: Insufficient documentation

## 2016-07-20 LAB — GLUCOSE, CAPILLARY: GLUCOSE-CAPILLARY: 267 mg/dL — AB (ref 65–99)

## 2016-07-20 LAB — BASIC METABOLIC PANEL
Anion gap: 7 (ref 5–15)
BUN: 32 mg/dL — AB (ref 6–20)
CO2: 26 mmol/L (ref 22–32)
CREATININE: 1.18 mg/dL — AB (ref 0.44–1.00)
Calcium: 8.9 mg/dL (ref 8.9–10.3)
Chloride: 106 mmol/L (ref 101–111)
GFR calc Af Amer: 54 mL/min — ABNORMAL LOW (ref >60)
GFR calc non Af Amer: 47 mL/min — ABNORMAL LOW (ref >60)
GLUCOSE: 214 mg/dL — AB (ref 65–99)
Potassium: 4.1 mmol/L (ref 3.5–5.1)
SODIUM: 139 mmol/L (ref 135–145)

## 2016-07-20 LAB — CBC
HCT: 36.2 % (ref 36.0–46.0)
Hemoglobin: 11.5 g/dL — ABNORMAL LOW (ref 12.0–15.0)
MCH: 28.6 pg (ref 26.0–34.0)
MCHC: 31.8 g/dL (ref 30.0–36.0)
MCV: 90 fL (ref 78.0–100.0)
PLATELETS: 293 10*3/uL (ref 150–400)
RBC: 4.02 MIL/uL (ref 3.87–5.11)
RDW: 14.5 % (ref 11.5–15.5)
WBC: 9.7 10*3/uL (ref 4.0–10.5)

## 2016-07-20 LAB — SURGICAL PCR SCREEN
MRSA, PCR: NEGATIVE
STAPHYLOCOCCUS AUREUS: NEGATIVE

## 2016-07-20 MED FILL — FREESTYLE LITE METER: 1 days supply | Qty: 1 | Fill #0

## 2016-07-20 NOTE — Patient Instructions (Addendum)
Arlina Sabina Moris  07/20/2016   Your procedure is scheduled on: 07-29-16   Report to [facility] Main  [facility]  elevators to 3rd floor to Short Stay Center at 0500 AM.    Call this number if you have problems the morning of surgery [phone]     Remember: ONLY 1 PERSON MAY GO WITH YOU TO SHORT STAY TO GET  READY MORNING OF YOUR SURGERY.  Do not eat food or drink liquids :After Midnight.     Take these medicines the morning of surgery with A SIP OF WATER:  Duloxetine (Cymbalta), Metoprolol (Lopressor), Levothyroxine (Synthroid), Flecainide  (Tambocor)   DO NOT TAKE ANY DIABETIC MEDICATIONS DAY OF YOUR SURGERY                               You may not have any metal on your body including hair pins and              piercings  Do not wear jewelry, make-up, lotions, powders or perfumes, deodorant             Do not wear nail polish.  Do not shave  48 hours prior to surgery.               Do not bring valuables to the hospital. [facility] IS NOT             RESPONSIBLE   FOR VALUABLES.  Contacts, dentures or bridgework may not be worn into surgery.  Leave suitcase in the car. After surgery it may be brought to your room.     Special Instructions: Please bring your tubing and mask for CPAP machine              Please read over the following fact sheets you were given: _____________________________________________________________________             How to Manage Your Diabetes Before and After Surgery  Why is it important to control my blood sugar before and after surgery? . Improving blood sugar levels before and after surgery helps healing and can limit problems. . A way of improving blood sugar control is eating a healthy diet by: o  Eating less sugar and carbohydrates o  Increasing activity/exercise o  Talking with your doctor about reaching your blood sugar goals . High blood sugars (greater than 180 mg/dL) can raise your risk of infections  and slow your recovery, so you will need to focus on controlling your diabetes during the weeks before surgery. . Make sure that the doctor who takes care of your diabetes knows about your planned surgery including the date and location.  How do I manage my blood sugar before surgery? . Check your blood sugar at least 4 times a day, starting 2 days before surgery, to make sure that the level is not too high or low. o Check your blood sugar the morning of your surgery when you wake up and every 2 hours until you get to the Short Stay unit. . If your blood sugar is less than 70 mg/dL, you will need to treat for low blood sugar: o Do not take insulin. o Treat a low blood sugar (less than 70 mg/dL) with  cup of clear juice (cranberry or apple), 4 glucose tablets, OR glucose gel. o Recheck blood sugar in 15  minutes after treatment (to make sure it is greater than 70 mg/dL). If your blood sugar is not greater than 70 mg/dL on recheck, call [phone] for further instructions. . Report your blood sugar to the short stay nurse when you get to Short Stay.  . If you are admitted to the hospital after surgery: o Your blood sugar will be checked by the staff and you will probably be given insulin after surgery (instead of oral diabetes medicines) to make sure you have good blood sugar levels. o The goal for blood sugar control after surgery is 80-180 mg/dL.   WHAT DO I DO ABOUT MY DIABETES MEDICATION?  [name] Do not take oral diabetes medicines (pills) the morning of surgery.  . THE DAY BEFORE SURGERY        Take your usual dose of Metformin        Take your evening dose of Victoza        Take your evening dose of Humalog            Take 60U of Lantus, in the evening  . THE MORNING OF SURGERY,        Donot take any oral or injectable diabetic medication  . The day of surgery, do not take other diabetes injectables, including Byetta (exenatide), Bydureon (exenatide ER), Victoza (liraglutide), or  Trulicity (dulaglutide).  . If your CBG is greater than 220 mg/dL, you may take  of your sliding scale  . (correction) dose of insulin.     Patient Signature:  Date:   Nurse Signature:  Date:   Reviewed and Endorsed by [facility] Patient Education Committee, August 2015  [facility] - Preparing for Surgery Before surgery, you can play an important role.  Because skin is not sterile, your skin needs to be as free of germs as possible.  You can reduce the number of germs on your skin by washing with CHG (chlorahexidine gluconate) soap before surgery.  CHG is an antiseptic cleaner which kills germs and bonds with the skin to continue killing germs even after washing. Please DO NOT use if you have an allergy to CHG or antibacterial soaps.  If your skin becomes reddened/irritated stop using the CHG and inform your nurse when you arrive at Short Stay. Do not shave (including legs and underarms) for at least 48 hours prior to the first CHG shower.  You may shave your face/neck. Please follow these instructions carefully:  1.  Shower with CHG Soap the night before surgery and the  morning of Surgery.  2.  If you choose to wash your hair, wash your hair first as usual with your  normal  shampoo.  3.  After you shampoo, rinse your hair and body thoroughly to remove the  shampoo.                           4.  Use CHG as you would any other liquid soap.  You can apply chg directly  to the skin and wash                       Gently with a scrungie or clean washcloth.  5.  Apply the CHG Soap to your body ONLY FROM THE NECK DOWN.   Do not use on face/ open  Wound or open sores. Avoid contact with eyes, ears mouth and genitals (private parts).                       Wash face,  Genitals (private parts) with your normal soap.             6.  Wash thoroughly, paying special attention to the area where your surgery  will be performed.  7.  Thoroughly rinse your body with warm water  from the neck down.  8.  DO NOT shower/wash with your normal soap after using and rinsing off  the CHG Soap.                9.  Pat yourself dry with a clean towel.            10.  Wear clean pajamas.            11.  Place clean sheets on your bed the night of your first shower and do not  sleep with pets. Day of Surgery : Do not apply any lotions/deodorants the morning of surgery.  Please wear clean clothes to the hospital/surgery center.  FAILURE TO FOLLOW THESE INSTRUCTIONS MAY RESULT IN THE CANCELLATION OF YOUR SURGERY PATIENT SIGNATURE_________________________________  NURSE SIGNATURE__________________________________  ________________________________________________________________________   Rogelia Mire  An incentive spirometer is a tool that can help keep your lungs clear and active. This tool measures how well you are filling your lungs with each breath. Taking long deep breaths may help reverse or decrease the chance of developing breathing (pulmonary) problems (especially infection) following:  A long period of time when you are unable to move or be active. BEFORE THE PROCEDURE   If the spirometer includes an indicator to show your best effort, your nurse or respiratory therapist will set it to a desired goal.  If possible, sit up straight or lean slightly forward. Try not to slouch.  Hold the incentive spirometer in an upright position. INSTRUCTIONS FOR USE  1. Sit on the edge of your bed if possible, or sit up as far as you can in bed or on a chair. 2. Hold the incentive spirometer in an upright position. 3. Breathe out normally. 4. Place the mouthpiece in your mouth and seal your lips tightly around it. 5. Breathe in slowly and as deeply as possible, raising the piston or the ball toward the top of the column. 6. Hold your breath for 3-5 seconds or for as long as possible. Allow the piston or ball to fall to the bottom of the column. 7. Remove the mouthpiece  from your mouth and breathe out normally. 8. Rest for a few seconds and repeat Steps 1 through 7 at least 10 times every 1-2 hours when you are awake. Take your time and take a few normal breaths between deep breaths. 9. The spirometer may include an indicator to show your best effort. Use the indicator as a goal to work toward during each repetition. 10. After each set of 10 deep breaths, practice coughing to be sure your lungs are clear. If you have an incision (the cut made at the time of surgery), support your incision when coughing by placing a pillow or rolled up towels firmly against it. Once you are able to get out of bed, walk around indoors and cough well. You may stop using the incentive spirometer when instructed by your caregiver.  RISKS AND COMPLICATIONS  Take your time so you do not  get dizzy or light-headed.  If you are in pain, you may need to take or ask for pain medication before doing incentive spirometry. It is harder to take a deep breath if you are having pain. AFTER USE  Rest and breathe slowly and easily.  It can be helpful to keep track of a log of your progress. Your caregiver can provide you with a simple table to help with this. If you are using the spirometer at home, follow these instructions: SEEK MEDICAL CARE IF:   You are having difficultly using the spirometer.  You have trouble using the spirometer as often as instructed.  Your pain medication is not giving enough relief while using the spirometer.  You develop fever of 100.5 F (38.1 C) or higher. SEEK IMMEDIATE MEDICAL CARE IF:   You cough up bloody sputum that had not been present before.  You develop fever of 102 F (38.9 C) or greater.  You develop worsening pain at or near the incision site. MAKE SURE YOU:   Understand these instructions.  Will watch your condition.  Will get help right away if you are not doing well or get worse. Document Released: 07/11/2006 Document Revised:  05/23/2011 Document Reviewed: 09/11/2006 ExitCare Patient Information 2014 [employer], [place].   ________________________________________________________________________  WHAT IS A BLOOD TRANSFUSION? Blood Transfusion Information  A transfusion is the replacement of blood or some of its parts. Blood is made up of multiple cells which provide different functions.  Red blood cells carry oxygen and are used for blood loss replacement.  White blood cells fight against infection.  Platelets control bleeding.  Plasma helps clot blood.  Other blood products are available for specialized needs, such as hemophilia or other clotting disorders. BEFORE THE TRANSFUSION  Who gives blood for transfusions?   Healthy volunteers who are fully evaluated to make sure their blood is safe. This is blood bank blood. Transfusion therapy is the safest it has ever been in the practice of medicine. Before blood is taken from a donor, a complete history is taken to make sure that person has no history of diseases nor engages in risky social behavior (examples are intravenous drug use or sexual activity with multiple partners). The donor's travel history is screened to minimize risk of transmitting infections, such as malaria. The donated blood is tested for signs of infectious diseases, such as HIV and hepatitis. The blood is then tested to be sure it is compatible with you in order to minimize the chance of a transfusion reaction. If you or a relative donates blood, this is often done in anticipation of surgery and is not appropriate for emergency situations. It takes many days to process the donated blood. RISKS AND COMPLICATIONS Although transfusion therapy is very safe and saves many lives, the main dangers of transfusion include:   Getting an infectious disease.  Developing a transfusion reaction. This is an allergic reaction to something in the blood you were given. Every precaution is taken to prevent  this. The decision to have a blood transfusion has been considered carefully by your caregiver before blood is given. Blood is not given unless the benefits outweigh the risks. AFTER THE TRANSFUSION  Right after receiving a blood transfusion, you will usually feel much better and more energetic. This is especially true if your red blood cells have gotten low (anemic). The transfusion raises the level of the red blood cells which carry oxygen, and this usually causes an energy increase.  The nurse administering the transfusion  will monitor you carefully for complications. HOME CARE INSTRUCTIONS  No special instructions are needed after a transfusion. You may find your energy is better. Speak with your caregiver about any limitations on activity for underlying diseases you may have. SEEK MEDICAL CARE IF:   Your condition is not improving after your transfusion.  You develop redness or irritation at the intravenous (IV) site. SEEK IMMEDIATE MEDICAL CARE IF:  Any of the following symptoms occur over the next 12 hours:  Shaking chills.  You have a temperature by mouth above 102 F (38.9 C), not controlled by medicine.  Chest, back, or muscle pain.  People around you feel you are not acting correctly or are confused.  Shortness of breath or difficulty breathing.  Dizziness and fainting.  You get a rash or develop hives.  You have a decrease in urine output.  Your urine turns a dark color or changes to pink, red, or brown. Any of the following symptoms occur over the next 10 days:  You have a temperature by mouth above 102 F (38.9 C), not controlled by medicine.  Shortness of breath.  Weakness after normal activity.  The white part of the eye turns yellow (jaundice).  You have a decrease in the amount of urine or are urinating less often.  Your urine turns a dark color or changes to pink, red, or brown. Document Released: 02/26/2000 Document Revised: 05/23/2011 Document  Reviewed: 10/15/2007 [facility] Patient Information 2014 [place], [place].  _______________________________________________________________________

## 2016-07-21 ENCOUNTER — Ambulatory Visit (HOSPITAL_COMMUNITY): Payer: 59

## 2016-07-21 LAB — HEMOGLOBIN A1C
Hgb A1c MFr Bld: 7.7 % — ABNORMAL HIGH (ref 4.8–5.6)
Mean Plasma Glucose: 174 mg/dL

## 2016-07-21 LAB — MYOCARDIAL PERFUSION IMAGING
CHL CUP NUCLEAR SSS: 11
CSEPPHR: 88 {beats}/min
LV dias vol: 101 mL (ref 46–106)
LVSYSVOL: 37 mL
RATE: 0.3
Rest HR: 59 {beats}/min
SDS: 8
SRS: 3
TID: 1.07

## 2016-07-21 MED ORDER — TECHNETIUM TC 99M TETROFOSMIN IV KIT
31.7000 | PACK | Freq: Once | INTRAVENOUS | Status: AC | PRN
  Administered 2016-07-21: 31.7 via INTRAVENOUS
  Filled 2016-07-21: qty 32

## 2016-07-21 NOTE — Progress Notes (Signed)
07-20-16 CMP results routed to Dr. Magnus IvanBlackman for review.

## 2016-07-22 ENCOUNTER — Other Ambulatory Visit: Payer: Self-pay

## 2016-07-22 NOTE — Telephone Encounter (Signed)
This encounter was created in error - please disregard.

## 2016-07-22 NOTE — Patient Outreach (Signed)
[facility]  07/22/2016  Stefanie RiceBonnie D Gonzales 19-Jun-1950 [id number]  Subjective: none.  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 History of diabetes, HTN,hyperlipidemia, osteoarthritis of the knee.   Assessment:  Received UMR Pre-surgical call referral on 07/14/16. Telephone call to patient's home / mobile number, no answer. HIPPA compliant voice message left. Pre-surgical call pending patient contact.   Plan: RNCM will call patient for 2nd telephone outreach attempt within the week if no return call.  Kathyrn SheriffJuana Yamna Mackel, RN, MSN, [facility] Center[facility],CCM [facility] Community Care Coordinator Cell: [phone]

## 2016-07-22 NOTE — Patient Outreach (Signed)
[facility]) Care Management  07/22/2016  [name] 1950/11/05 [id number]   Subjective: Telephone call to patient. Discussed UMR pre-op follow up. Patient voices understanding and agrees to pre-op call.    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  Assessment: Received UMR pre-op referral on 07/14/16. Pre-Op call completed.  FMLA-Client reports she has completed her FMLA forms and has been in contact with [employer]. Ms. Andreas BlowerRoot also reports she has been approved for short-term disability and been in contact with [employer]. She reports she did not choose the [facility] plan as part of her benefit package.  She states she has Medicare part A and UMR.    Post procedure equipment/home health- Ms. Keough denies any equipment needs at this time. RNCM reinforced that the inpatient case manager in the hospital will arrange if she has any of these needs prior to discharge.   RNCM discussed [place] benefit is higher when using a [place] facility/pharmacy. Client voiced understanding.    Support: Client reports that she has someone to assist with pharmacy needs at discharge. Client reports she has transportation to her follow up appointment.      Discussed Advanced Directives. RNCM reinforced that Spiritual care department available to assist [employer] employees with Advanced Directives as needed.  No other medical issues identified and no additional community resource information needs at this time.   Ms. Andreas BlowerRoot is agreeable to follow up post procedure call.  Plan: telephonic RNCM will follow up post discharge within 3 business days of notification.  Kathyrn SheriffJuana Tarin Johndrow, RN, MSN, [facility],CCM [facility] Community Care Coordinator Cell: [phone]

## 2016-07-24 MED FILL — OMEPRAZOLE 20 MG CAPSULE DR: 20 | 90 days supply | Qty: 90 | Fill #3

## 2016-07-24 MED FILL — AMLODIPINE BESYLATE 5 MG TA: 5 | 90 days supply | Qty: 90 | Fill #1

## 2016-07-24 MED FILL — LEVOTHYROXINE 100 MCG TAB: 100 | 90 days supply | Qty: 90 | Fill #2

## 2016-07-24 MED FILL — VALSARTAN-HCTZ 320-12.5 MG: 320-12.5 | 30 days supply | Qty: 30 | Fill #3

## 2016-07-24 MED FILL — METFORMIN HCL ER 500 MG TAB: 500 | 90 days supply | Qty: 180 | Fill #2

## 2016-07-25 ENCOUNTER — Telehealth: Payer: Self-pay | Admitting: Cardiovascular Disease

## 2016-07-25 MED FILL — DULoxetine HCL 60 MG CPEP: 60 | 30 days supply | Qty: 30 | Fill #2

## 2016-07-25 NOTE — Telephone Encounter (Signed)
Continue lopressor, flecainide ok to have knee surgery

## 2016-07-25 NOTE — Telephone Encounter (Signed)
Pt has surgery Friday, wants to stop monitor 07-27-16, just a few days short-per pt monitor company needs order from Nishan's office to stop monitoring -pls call pt to advise [phone] until 2p

## 2016-07-25 NOTE — Telephone Encounter (Signed)
Called patient about monitor report on 07/22/16 at 8:57 pm (CT). Patient stated at the time she had already taken her evening dose of metoprolol and flecainide. Report showed A. Fib with HR 180, patient stated at the time she felt her heart racing while sitting at home watching TV. Patient is suppose to have surgery on 07/29/16. Will forward for Dr. Eden EmmsNishan for advisement.

## 2016-07-25 NOTE — Telephone Encounter (Signed)
Per Dr. Eden EmmsNishan, follow-up with A. Fib clinic as well.  Called patient and made an appointment with the A. FIB clinic 6 weeks after her surgery. Encouraged patient to keep taking her medications as prescribed, and to hold her xarelto two days before her surgery. Patient wanted to also stop her monitor early. Dr. Eden EmmsNishan is okay with patient stopping monitor. Patient verbalized understanding and will call with any other questions.

## 2016-07-25 NOTE — Progress Notes (Signed)
07-22-16 Stress test on chart

## 2016-07-27 DIAGNOSIS — E113299 Type 2 diabetes mellitus with mild nonproliferative diabetic retinopathy without macular edema, unspecified eye: Secondary | ICD-10-CM | POA: Diagnosis not present

## 2016-07-27 DIAGNOSIS — R0609 Other forms of dyspnea: Secondary | ICD-10-CM | POA: Diagnosis not present

## 2016-07-27 DIAGNOSIS — M179 Osteoarthritis of knee, unspecified: Secondary | ICD-10-CM | POA: Diagnosis not present

## 2016-07-27 DIAGNOSIS — R002 Palpitations: Secondary | ICD-10-CM | POA: Diagnosis not present

## 2016-07-27 DIAGNOSIS — R0602 Shortness of breath: Secondary | ICD-10-CM | POA: Diagnosis not present

## 2016-07-27 DIAGNOSIS — M791 Myalgia: Secondary | ICD-10-CM | POA: Diagnosis not present

## 2016-07-27 DIAGNOSIS — R05 Cough: Secondary | ICD-10-CM | POA: Diagnosis not present

## 2016-07-27 DIAGNOSIS — M109 Gout, unspecified: Secondary | ICD-10-CM | POA: Diagnosis not present

## 2016-07-27 DIAGNOSIS — R5383 Other fatigue: Secondary | ICD-10-CM | POA: Diagnosis not present

## 2016-07-28 NOTE — Anesthesia Preprocedure Evaluation (Addendum)
Anesthesia Evaluation  Patient identified by MRN, date of birth, ID band Patient awake    Reviewed: Allergy & Precautions, NPO status , Patient's Chart, lab work & pertinent test results  History of Anesthesia Complications (+) PONV and history of anesthetic complications  Airway Mallampati: II  TM Distance: >3 FB Neck ROM: Full    Dental  (+) Teeth Intact, Dental Advisory Given, Caps,    Pulmonary sleep apnea , former smoker,    Pulmonary exam normal breath sounds clear to auscultation       Cardiovascular hypertension, Normal cardiovascular exam Rhythm:Regular Rate:Normal     Neuro/Psych PSYCHIATRIC DISORDERS Depression Left arm numbness     GI/Hepatic Neg liver ROS, GERD  ,  Endo/Other  diabetes, Type 2, Oral Hypoglycemic AgentsHypothyroidism Morbid obesity  Renal/GU Renal InsufficiencyRenal disease     Musculoskeletal  (+) Arthritis , Osteoarthritis,    Abdominal   Peds  Hematology  (+) Blood dyscrasia, anemia , Plt 293k on 07/20/16   Anesthesia Other Findings Day of surgery medications reviewed with the patient.  Reproductive/Obstetrics                           Anesthesia Physical Anesthesia Plan  ASA: III  Anesthesia Plan: General   Post-op Pain Management:  Regional for Post-op pain   Induction: Intravenous  Airway Management Planned: Oral ETT  Additional Equipment:   Intra-op Plan:   Post-operative Plan: Extubation in OR  Informed Consent: I have reviewed the patients History and Physical, chart, labs and discussed the procedure including the risks, benefits and alternatives for the proposed anesthesia with the patient or authorized representative who has indicated his/her understanding and acceptance.   Dental advisory given  Plan Discussed with: CRNA, Anesthesiologist and Surgeon  Anesthesia Plan Comments: (Risks/benefits of general anesthesia discussed with  patient including risk of damage to teeth, lips, gum, and tongue, nausea/vomiting, allergic reactions to medications, and the possibility of heart attack, stroke and death.  All patient questions answered.  Patient wishes to proceed.)      Anesthesia Quick Evaluation

## 2016-07-29 ENCOUNTER — Encounter (HOSPITAL_COMMUNITY)
Admission: RE | Disposition: A | Discharge status: Home Health | Payer: Self-pay | Source: Ambulatory Visit | Attending: Orthopaedic Surgery

## 2016-07-29 ENCOUNTER — Inpatient Hospital Stay (HOSPITAL_COMMUNITY): Payer: 59

## 2016-07-29 ENCOUNTER — Inpatient Hospital Stay (HOSPITAL_COMMUNITY): Payer: 59 | Admitting: Anesthesiology

## 2016-07-29 ENCOUNTER — Encounter (HOSPITAL_COMMUNITY): Payer: Self-pay | Admitting: *Deleted

## 2016-07-29 ENCOUNTER — Inpatient Hospital Stay (HOSPITAL_COMMUNITY)
Admission: RE | Admit: 2016-07-29 | Discharge: 2016-07-31 | DRG: 488 | Disposition: A | Discharge status: Home Health | Payer: 59 | Source: Ambulatory Visit | Attending: Orthopaedic Surgery | Admitting: Orthopaedic Surgery

## 2016-07-29 DIAGNOSIS — Z79899 Other long term (current) drug therapy: Secondary | ICD-10-CM | POA: Diagnosis not present

## 2016-07-29 DIAGNOSIS — Z794 Long term (current) use of insulin: Secondary | ICD-10-CM

## 2016-07-29 DIAGNOSIS — E039 Hypothyroidism, unspecified: Secondary | ICD-10-CM | POA: Diagnosis present

## 2016-07-29 DIAGNOSIS — E785 Hyperlipidemia, unspecified: Secondary | ICD-10-CM | POA: Diagnosis present

## 2016-07-29 DIAGNOSIS — Z96659 Presence of unspecified artificial knee joint: Secondary | ICD-10-CM

## 2016-07-29 DIAGNOSIS — I10 Essential (primary) hypertension: Secondary | ICD-10-CM | POA: Diagnosis not present

## 2016-07-29 DIAGNOSIS — M65861 Other synovitis and tenosynovitis, right lower leg: Secondary | ICD-10-CM | POA: Diagnosis present

## 2016-07-29 DIAGNOSIS — K219 Gastro-esophageal reflux disease without esophagitis: Secondary | ICD-10-CM | POA: Diagnosis present

## 2016-07-29 DIAGNOSIS — Z471 Aftercare following joint replacement surgery: Secondary | ICD-10-CM | POA: Diagnosis not present

## 2016-07-29 DIAGNOSIS — F329 Major depressive disorder, single episode, unspecified: Secondary | ICD-10-CM | POA: Diagnosis present

## 2016-07-29 DIAGNOSIS — T8484XA Pain due to internal orthopedic prosthetic devices, implants and grafts, initial encounter: Secondary | ICD-10-CM | POA: Diagnosis not present

## 2016-07-29 DIAGNOSIS — Z87891 Personal history of nicotine dependence: Secondary | ICD-10-CM | POA: Diagnosis not present

## 2016-07-29 DIAGNOSIS — Z6841 Body Mass Index (BMI) 40.0 and over, adult: Secondary | ICD-10-CM

## 2016-07-29 DIAGNOSIS — Y838 Other surgical procedures as the cause of abnormal reaction of the patient, or of later complication, without mention of misadventure at the time of the procedure: Secondary | ICD-10-CM | POA: Diagnosis present

## 2016-07-29 DIAGNOSIS — G8918 Other acute postprocedural pain: Secondary | ICD-10-CM | POA: Diagnosis not present

## 2016-07-29 DIAGNOSIS — E119 Type 2 diabetes mellitus without complications: Secondary | ICD-10-CM | POA: Diagnosis present

## 2016-07-29 DIAGNOSIS — T8453XA Infection and inflammatory reaction due to internal right knee prosthesis, initial encounter: Secondary | ICD-10-CM | POA: Diagnosis not present

## 2016-07-29 DIAGNOSIS — G4733 Obstructive sleep apnea (adult) (pediatric): Secondary | ICD-10-CM | POA: Diagnosis present

## 2016-07-29 DIAGNOSIS — M25561 Pain in right knee: Secondary | ICD-10-CM | POA: Diagnosis present

## 2016-07-29 DIAGNOSIS — T84068A Wear of articular bearing surface of other internal prosthetic joint, initial encounter: Secondary | ICD-10-CM

## 2016-07-29 DIAGNOSIS — Z96651 Presence of right artificial knee joint: Secondary | ICD-10-CM | POA: Diagnosis not present

## 2016-07-29 DIAGNOSIS — T84062A Wear of articular bearing surface of internal prosthetic right knee joint, initial encounter: Secondary | ICD-10-CM | POA: Diagnosis present

## 2016-07-29 DIAGNOSIS — T84068D Wear of articular bearing surface of other internal prosthetic joint, subsequent encounter: Secondary | ICD-10-CM | POA: Diagnosis not present

## 2016-07-29 HISTORY — PX: TOTAL KNEE REVISION: SHX996

## 2016-07-29 LAB — TYPE AND SCREEN
ABO/RH(D): A POS
Antibody Screen: NEGATIVE

## 2016-07-29 LAB — GLUCOSE, CAPILLARY
GLUCOSE-CAPILLARY: 182 mg/dL — AB (ref 65–99)
GLUCOSE-CAPILLARY: 184 mg/dL — AB (ref 65–99)
Glucose-Capillary: 71 mg/dL (ref 65–99)
Glucose-Capillary: 132 mg/dL — ABNORMAL HIGH (ref 65–99)

## 2016-07-29 SURGERY — TOTAL KNEE REVISION
Anesthesia: General | Site: Knee | Laterality: Right

## 2016-07-29 MED ORDER — LIRAGLUTIDE 18 MG/3ML ~~LOC~~ SOLN: 1.8000 mg | Freq: Every evening | SUBCUTANEOUS | Status: DC

## 2016-07-29 MED ORDER — LIDOCAINE 2% (20 MG/ML) 5 ML SYRINGE
INTRAMUSCULAR | Status: DC | PRN
  Administered 2016-07-29: 100 mg via INTRAVENOUS

## 2016-07-29 MED ORDER — CEFAZOLIN SODIUM-DEXTROSE 2-4 GM/100ML-% IV SOLN
2.0000 g | Freq: Four times a day (QID) | INTRAVENOUS | Status: AC
  Administered 2016-07-29 (×2): 2 g via INTRAVENOUS
  Filled 2016-07-29 (×2): qty 100

## 2016-07-29 MED ORDER — FENTANYL CITRATE (PF) 100 MCG/2ML IJ SOLN
INTRAMUSCULAR | Status: AC
  Administered 2016-07-29: 50 ug via INTRAVENOUS
  Filled 2016-07-29: qty 2

## 2016-07-29 MED ORDER — ACETAMINOPHEN 325 MG PO TABS
650.0000 mg | ORAL_TABLET | Freq: Four times a day (QID) | ORAL | Status: DC | PRN
  Administered 2016-07-30: 650 mg via ORAL
  Filled 2016-07-29: qty 2

## 2016-07-29 MED ORDER — INSULIN GLARGINE 100 UNIT/ML ~~LOC~~ SOLN
60.0000 [IU] | Freq: Two times a day (BID) | SUBCUTANEOUS | Status: DC
  Administered 2016-07-29 – 2016-07-31 (×4): 60 [IU] via SUBCUTANEOUS
  Filled 2016-07-29 (×5): qty 0.6

## 2016-07-29 MED ORDER — ONDANSETRON HCL 4 MG/2ML IJ SOLN: 4.0000 mg | Freq: Four times a day (QID) | INTRAMUSCULAR | Status: DC | PRN

## 2016-07-29 MED ORDER — METOCLOPRAMIDE HCL 5 MG PO TABS: 5.0000 mg | ORAL_TABLET | Freq: Three times a day (TID) | ORAL | Status: DC | PRN

## 2016-07-29 MED ORDER — INSULIN ASPART 100 UNIT/ML ~~LOC~~ SOLN
20.0000 [IU] | Freq: Every day | SUBCUTANEOUS | Status: DC
  Administered 2016-07-29 – 2016-07-30 (×2): 20 [IU] via SUBCUTANEOUS
  Filled 2016-07-29: qty 1

## 2016-07-29 MED ORDER — PROPOFOL 10 MG/ML IV BOLUS
INTRAVENOUS | Status: AC
  Filled 2016-07-29: qty 40

## 2016-07-29 MED ORDER — HYDROCHLOROTHIAZIDE 12.5 MG PO CAPS
12.5000 mg | ORAL_CAPSULE | Freq: Every day | ORAL | Status: DC
  Administered 2016-07-29 – 2016-07-31 (×3): 12.5 mg via ORAL
  Filled 2016-07-29 (×3): qty 1

## 2016-07-29 MED ORDER — TRANEXAMIC ACID 1000 MG/10ML IV SOLN
1000.0000 mg | INTRAVENOUS | Status: AC
  Administered 2016-07-29: 1000 mg via INTRAVENOUS
  Filled 2016-07-29: qty 10

## 2016-07-29 MED ORDER — FENTANYL CITRATE (PF) 100 MCG/2ML IJ SOLN
INTRAMUSCULAR | Status: DC | PRN
  Administered 2016-07-29 (×2): 50 ug via INTRAVENOUS

## 2016-07-29 MED ORDER — DIPHENHYDRAMINE HCL 12.5 MG/5ML PO ELIX: 12.5000 mg | ORAL_SOLUTION | ORAL | Status: DC | PRN

## 2016-07-29 MED ORDER — SODIUM CHLORIDE 0.9 % IV SOLN
INTRAVENOUS | Status: DC
  Administered 2016-07-29: 14:00:00 via INTRAVENOUS

## 2016-07-29 MED ORDER — DOCUSATE SODIUM 100 MG PO CAPS
100.0000 mg | ORAL_CAPSULE | Freq: Two times a day (BID) | ORAL | Status: DC
  Administered 2016-07-29 – 2016-07-31 (×5): 100 mg via ORAL
  Filled 2016-07-29 (×5): qty 1

## 2016-07-29 MED ORDER — SUGAMMADEX SODIUM 500 MG/5ML IV SOLN
INTRAVENOUS | Status: AC
  Filled 2016-07-29: qty 5

## 2016-07-29 MED ORDER — HYDROMORPHONE HCL 1 MG/ML IJ SOLN: 1.0000 mg | INTRAMUSCULAR | Status: DC | PRN

## 2016-07-29 MED ORDER — ONDANSETRON HCL 4 MG/2ML IJ SOLN: 4.0000 mg | Freq: Once | INTRAMUSCULAR | Status: DC | PRN

## 2016-07-29 MED ORDER — CHLORHEXIDINE GLUCONATE 4 % EX LIQD: 60.0000 mL | Freq: Once | CUTANEOUS | Status: DC

## 2016-07-29 MED ORDER — METOPROLOL TARTRATE 25 MG PO TABS
25.0000 mg | ORAL_TABLET | Freq: Two times a day (BID) | ORAL | Status: DC
  Administered 2016-07-29 – 2016-07-31 (×4): 25 mg via ORAL
  Filled 2016-07-29 (×4): qty 1

## 2016-07-29 MED ORDER — LIRAGLUTIDE 18 MG/3ML ~~LOC~~ SOPN: 1.8000 mg | PEN_INJECTOR | Freq: Every evening | SUBCUTANEOUS | Status: DC

## 2016-07-29 MED ORDER — PANTOPRAZOLE SODIUM 40 MG PO TBEC
40.0000 mg | DELAYED_RELEASE_TABLET | Freq: Every day | ORAL | Status: DC
  Administered 2016-07-29 – 2016-07-31 (×3): 40 mg via ORAL
  Filled 2016-07-29 (×4): qty 1

## 2016-07-29 MED ORDER — METHOCARBAMOL 500 MG PO TABS: 500.0000 mg | ORAL_TABLET | Freq: Three times a day (TID) | ORAL | Status: DC | PRN

## 2016-07-29 MED ORDER — FLECAINIDE ACETATE 50 MG PO TABS
50.0000 mg | ORAL_TABLET | Freq: Two times a day (BID) | ORAL | Status: DC
  Administered 2016-07-29 – 2016-07-31 (×4): 50 mg via ORAL
  Filled 2016-07-29 (×5): qty 1

## 2016-07-29 MED ORDER — MIDAZOLAM HCL 2 MG/2ML IJ SOLN
INTRAMUSCULAR | Status: AC
  Filled 2016-07-29: qty 2

## 2016-07-29 MED ORDER — LACTATED RINGERS IV SOLN
INTRAVENOUS | Status: DC | PRN
  Administered 2016-07-29: 07:00:00 via INTRAVENOUS

## 2016-07-29 MED ORDER — DEXAMETHASONE SODIUM PHOSPHATE 10 MG/ML IJ SOLN
INTRAMUSCULAR | Status: DC | PRN
  Administered 2016-07-29: 5 mg via INTRAVENOUS

## 2016-07-29 MED ORDER — MELATONIN 5 MG PO CAPS
5.0000 mg | ORAL_CAPSULE | Freq: Every evening | ORAL | Status: DC | PRN
Start: 2016-07-29 — End: 2016-07-29

## 2016-07-29 MED ORDER — ONDANSETRON HCL 4 MG/2ML IJ SOLN
INTRAMUSCULAR | Status: AC
  Filled 2016-07-29: qty 2

## 2016-07-29 MED ORDER — INSULIN LISPRO 100 UNIT/ML ~~LOC~~ SOLN: 20.0000 [IU] | Freq: Every day | SUBCUTANEOUS | Status: DC

## 2016-07-29 MED ORDER — MENTHOL 3 MG MT LOZG: 1.0000 | LOZENGE | OROMUCOSAL | Status: DC | PRN

## 2016-07-29 MED ORDER — MIDAZOLAM HCL 5 MG/5ML IJ SOLN
INTRAMUSCULAR | Status: DC | PRN
  Administered 2016-07-29 (×2): 1 mg via INTRAVENOUS

## 2016-07-29 MED ORDER — SUCCINYLCHOLINE CHLORIDE 200 MG/10ML IV SOSY
PREFILLED_SYRINGE | INTRAVENOUS | Status: DC | PRN
Start: 2016-07-29 — End: 2016-07-29
  Administered 2016-07-29: 120 mg via INTRAVENOUS

## 2016-07-29 MED ORDER — FENTANYL CITRATE (PF) 250 MCG/5ML IJ SOLN
INTRAMUSCULAR | Status: AC
  Filled 2016-07-29: qty 5

## 2016-07-29 MED ORDER — ATORVASTATIN CALCIUM 40 MG PO TABS
40.0000 mg | ORAL_TABLET | Freq: Every evening | ORAL | Status: DC
  Administered 2016-07-29 – 2016-07-30 (×2): 40 mg via ORAL
  Filled 2016-07-29 (×2): qty 1

## 2016-07-29 MED ORDER — METFORMIN HCL ER 500 MG PO TB24
1000.0000 mg | ORAL_TABLET | Freq: Every day | ORAL | Status: DC
  Administered 2016-07-29 – 2016-07-30 (×2): 1000 mg via ORAL
  Filled 2016-07-29 (×2): qty 2

## 2016-07-29 MED ORDER — ALUM & MAG HYDROXIDE-SIMETH 200-200-20 MG/5ML PO SUSP: 30.0000 mL | ORAL | Status: DC | PRN

## 2016-07-29 MED ORDER — ACETAMINOPHEN 325 MG PO TABS
ORAL_TABLET | ORAL | Status: DC | PRN
  Administered 2016-07-29: 1000 mg via ORAL

## 2016-07-29 MED ORDER — SUGAMMADEX SODIUM 200 MG/2ML IV SOLN
INTRAVENOUS | Status: DC | PRN
  Administered 2016-07-29: 250 mg via INTRAVENOUS

## 2016-07-29 MED ORDER — METHOCARBAMOL 500 MG PO TABS
500.0000 mg | ORAL_TABLET | Freq: Four times a day (QID) | ORAL | Status: DC | PRN
  Administered 2016-07-29 – 2016-07-31 (×4): 500 mg via ORAL
  Filled 2016-07-29 (×4): qty 1

## 2016-07-29 MED ORDER — OXYCODONE HCL 5 MG PO TABS
5.0000 mg | ORAL_TABLET | ORAL | Status: DC | PRN
  Administered 2016-07-29: 10 mg via ORAL
  Administered 2016-07-29 (×2): 5 mg via ORAL
  Administered 2016-07-30 – 2016-07-31 (×4): 10 mg via ORAL
  Filled 2016-07-29 (×4): qty 2
  Filled 2016-07-29: qty 1
  Filled 2016-07-29: qty 2
  Filled 2016-07-29: qty 1
  Filled 2016-07-29: qty 2

## 2016-07-29 MED ORDER — VALSARTAN-HYDROCHLOROTHIAZIDE 320-12.5 MG PO TABS: 1.0000 | ORAL_TABLET | Freq: Every day | ORAL | Status: DC

## 2016-07-29 MED ORDER — PHENOL 1.4 % MT LIQD: 1.0000 | OROMUCOSAL | Status: DC | PRN

## 2016-07-29 MED ORDER — SCOPOLAMINE 1 MG/3DAYS TD PT72
MEDICATED_PATCH | TRANSDERMAL | Status: AC
  Filled 2016-07-29: qty 1

## 2016-07-29 MED ORDER — ACETAMINOPHEN 650 MG RE SUPP: 650.0000 mg | Freq: Four times a day (QID) | RECTAL | Status: DC | PRN

## 2016-07-29 MED ORDER — FENTANYL CITRATE (PF) 100 MCG/2ML IJ SOLN
25.0000 ug | INTRAMUSCULAR | Status: DC | PRN
  Administered 2016-07-29 (×2): 50 ug via INTRAVENOUS

## 2016-07-29 MED ORDER — BUPIVACAINE-EPINEPHRINE (PF) 0.5% -1:200000 IJ SOLN
INTRAMUSCULAR | Status: DC | PRN
  Administered 2016-07-29: 30 mL via PERINEURAL

## 2016-07-29 MED ORDER — SODIUM CHLORIDE 0.9 % IR SOLN
Status: DC | PRN
  Administered 2016-07-29: 3000 mL
  Administered 2016-07-29: 1000 mL

## 2016-07-29 MED ORDER — SCOPOLAMINE 1 MG/3DAYS TD PT72
MEDICATED_PATCH | TRANSDERMAL | Status: DC | PRN
  Administered 2016-07-29: 1 via TRANSDERMAL

## 2016-07-29 MED ORDER — FENTANYL CITRATE (PF) 100 MCG/2ML IJ SOLN
INTRAMUSCULAR | Status: AC
  Filled 2016-07-29: qty 2

## 2016-07-29 MED ORDER — VITAMIN D3 25 MCG (1000 UNIT) PO TABS
1000.0000 [IU] | ORAL_TABLET | Freq: Every evening | ORAL | Status: DC
  Administered 2016-07-29 – 2016-07-30 (×2): 1000 [IU] via ORAL
  Filled 2016-07-29 (×2): qty 1

## 2016-07-29 MED ORDER — IRBESARTAN 150 MG PO TABS
300.0000 mg | ORAL_TABLET | Freq: Every day | ORAL | Status: DC
  Administered 2016-07-29 – 2016-07-31 (×3): 300 mg via ORAL
  Filled 2016-07-29 (×3): qty 2

## 2016-07-29 MED ORDER — ONDANSETRON HCL 4 MG PO TABS: 4.0000 mg | ORAL_TABLET | Freq: Four times a day (QID) | ORAL | Status: DC | PRN

## 2016-07-29 MED ORDER — AMLODIPINE BESYLATE 5 MG PO TABS
5.0000 mg | ORAL_TABLET | Freq: Every day | ORAL | Status: DC
  Administered 2016-07-29 – 2016-07-31 (×3): 5 mg via ORAL
  Filled 2016-07-29 (×3): qty 1

## 2016-07-29 MED ORDER — ACETAMINOPHEN 500 MG PO TABS
ORAL_TABLET | ORAL | Status: AC
  Filled 2016-07-29: qty 2

## 2016-07-29 MED ORDER — METOCLOPRAMIDE HCL 5 MG/ML IJ SOLN: 5.0000 mg | Freq: Three times a day (TID) | INTRAMUSCULAR | Status: DC | PRN

## 2016-07-29 MED ORDER — ONDANSETRON HCL 4 MG/2ML IJ SOLN
INTRAMUSCULAR | Status: DC | PRN
  Administered 2016-07-29: 4 mg via INTRAVENOUS

## 2016-07-29 MED ORDER — PROPOFOL 10 MG/ML IV BOLUS
INTRAVENOUS | Status: DC | PRN
  Administered 2016-07-29: 200 mg via INTRAVENOUS

## 2016-07-29 MED ORDER — LEVOTHYROXINE SODIUM 100 MCG PO TABS
100.0000 ug | ORAL_TABLET | Freq: Every day | ORAL | Status: DC
  Administered 2016-07-30 – 2016-07-31 (×2): 100 ug via ORAL
  Filled 2016-07-29 (×2): qty 1

## 2016-07-29 MED ORDER — DULOXETINE HCL 60 MG PO CPEP
60.0000 mg | ORAL_CAPSULE | Freq: Every day | ORAL | Status: DC
  Administered 2016-07-29 – 2016-07-31 (×3): 60 mg via ORAL
  Filled 2016-07-29 (×3): qty 1

## 2016-07-29 MED ORDER — DEXTROSE 5 % IV SOLN
3.0000 g | INTRAVENOUS | Status: AC
  Administered 2016-07-29: 3 g via INTRAVENOUS
  Filled 2016-07-29: qty 3

## 2016-07-29 MED ORDER — RIVAROXABAN 10 MG PO TABS
20.0000 mg | ORAL_TABLET | Freq: Every day | ORAL | Status: DC
  Administered 2016-07-29 – 2016-07-30 (×2): 20 mg via ORAL
  Filled 2016-07-29 (×2): qty 2

## 2016-07-29 MED ORDER — ROCURONIUM BROMIDE 50 MG/5ML IV SOSY
PREFILLED_SYRINGE | INTRAVENOUS | Status: DC | PRN
  Administered 2016-07-29: 40 mg via INTRAVENOUS
  Administered 2016-07-29: 10 mg via INTRAVENOUS

## 2016-07-29 MED ORDER — DEXAMETHASONE SODIUM PHOSPHATE 10 MG/ML IJ SOLN
INTRAMUSCULAR | Status: AC
  Filled 2016-07-29: qty 1

## 2016-07-29 MED ORDER — METHOCARBAMOL 1000 MG/10ML IJ SOLN
500.0000 mg | Freq: Four times a day (QID) | INTRAVENOUS | Status: DC | PRN
  Administered 2016-07-29: 500 mg via INTRAVENOUS
  Filled 2016-07-29: qty 550

## 2016-07-29 SURGICAL SUPPLY — 48 items
BAG SPEC THK2 15X12 ZIP CLS (MISCELLANEOUS)
BAG ZIPLOCK 12X15 (MISCELLANEOUS) IMPLANT
BANDAGE ACE 6X5 VEL STRL LF (GAUZE/BANDAGES/DRESSINGS) ×6 IMPLANT
BENZOIN TINCTURE PRP APPL 2/3 (GAUZE/BANDAGES/DRESSINGS) IMPLANT
BLADE SAG 18X100X1.27 (BLADE) IMPLANT
CLOSURE WOUND 1/2 X4 (GAUZE/BANDAGES/DRESSINGS)
CLOTH BEACON ORANGE TIMEOUT ST (SAFETY) ×3 IMPLANT
COVER SURGICAL LIGHT HANDLE (MISCELLANEOUS) ×3 IMPLANT
CUFF TOURN SGL QUICK 34 (TOURNIQUET CUFF) ×2
CUFF TRNQT CYL 34X4X40X1 (TOURNIQUET CUFF) ×1 IMPLANT
DRAPE U-SHAPE 47X51 STRL (DRAPES) ×3 IMPLANT
DRSG PAD ABDOMINAL 8X10 ST (GAUZE/BANDAGES/DRESSINGS) ×3 IMPLANT
DURAPREP 26ML APPLICATOR (WOUND CARE) ×3 IMPLANT
ELECT REM PT RETURN 15FT ADLT (MISCELLANEOUS) ×3 IMPLANT
EVACUATOR 1/8 PVC DRAIN (DRAIN) IMPLANT
GAUZE SPONGE 4X4 12PLY STRL (GAUZE/BANDAGES/DRESSINGS) ×3 IMPLANT
GAUZE XEROFORM 1X8 LF (GAUZE/BANDAGES/DRESSINGS) ×3 IMPLANT
GLOVE BIO SURGEON STRL SZ7.5 (GLOVE) ×3 IMPLANT
GLOVE BIOGEL PI IND STRL 7.0 (GLOVE) ×4 IMPLANT
GLOVE BIOGEL PI IND STRL 8 (GLOVE) ×2 IMPLANT
GLOVE BIOGEL PI INDICATOR 7.0 (GLOVE) ×8
GLOVE BIOGEL PI INDICATOR 8 (GLOVE) ×4
GLOVE ECLIPSE 8.0 STRL XLNG CF (GLOVE) ×3 IMPLANT
GOWN STRL REUS W/TWL XL LVL3 (GOWN DISPOSABLE) ×6 IMPLANT
HANDPIECE INTERPULSE COAX TIP (DISPOSABLE) ×2
IMMOBILIZER KNEE 20 (SOFTGOODS) ×3
IMMOBILIZER KNEE 20 THIGH 36 (SOFTGOODS) ×1 IMPLANT
INSERT PFC SIG STB SZ 2.5 15.5 (Knees) ×3 IMPLANT
PACK TOTAL KNEE CUSTOM (KITS) ×3 IMPLANT
PAD ABD 8X10 STRL (GAUZE/BANDAGES/DRESSINGS) ×6 IMPLANT
PADDING CAST COTTON 6X4 STRL (CAST SUPPLIES) ×6 IMPLANT
POSITIONER SURGICAL ARM (MISCELLANEOUS) IMPLANT
SET HNDPC FAN SPRY TIP SCT (DISPOSABLE) ×1 IMPLANT
SET PAD KNEE POSITIONER (MISCELLANEOUS) ×3 IMPLANT
SPONGE LAP 18X18 X RAY DECT (DISPOSABLE) IMPLANT
STAPLER VISISTAT 35W (STAPLE) ×3 IMPLANT
STRIP CLOSURE SKIN 1/2X4 (GAUZE/BANDAGES/DRESSINGS) IMPLANT
SUT MNCRL AB 4-0 PS2 18 (SUTURE) IMPLANT
SUT VIC AB 0 CT1 36 (SUTURE) ×3 IMPLANT
SUT VIC AB 1 CT1 36 (SUTURE) ×6 IMPLANT
SUT VIC AB 2-0 CT1 27 (SUTURE) ×4
SUT VIC AB 2-0 CT1 TAPERPNT 27 (SUTURE) ×2 IMPLANT
SWAB COLLECTION DEVICE MRSA (MISCELLANEOUS) IMPLANT
SWAB CULTURE ESWAB REG 1ML (MISCELLANEOUS) IMPLANT
TOWER CARTRIDGE SMART MIX (DISPOSABLE) IMPLANT
TUBE KAMVAC SUCTION (TUBING) IMPLANT
WATER STERILE IRR 1500ML POUR (IV SOLUTION) ×3 IMPLANT
WRAP KNEE MAXI GEL POST OP (GAUZE/BANDAGES/DRESSINGS) ×3 IMPLANT

## 2016-07-29 NOTE — Evaluation (Signed)
Physical Therapy Evaluation Patient Details Name: [name] MRN: [id number] DOB: 1950-09-22 Today's Date: 07/29/2016   History of Present Illness  Pt s/p R TKR revision and with hx of DM and Bil TKR  Clinical Impression  Pt s/p R TKR revision and presents with decreased R LE strength/ROM and post op pain limiting functional mobility.  Pt should progress to dc home with family assist and HHPT follow up.    Follow Up Recommendations Home health PT    Equipment Recommendations  None recommended by PT    Recommendations for Other Services OT consult     Precautions / Restrictions Precautions Precautions: Fall Required Braces or Orthoses: Knee Immobilizer - Right Knee Immobilizer - Right: Discontinue once straight leg raise with < 10 degree lag Restrictions Weight Bearing Restrictions: No Other Position/Activity Restrictions: WBAT      Mobility  Bed Mobility Overal bed mobility: Needs Assistance Bed Mobility: Supine to Sit     Supine to sit: Min guard     General bed mobility comments: min cues for sequence and use of L LE to self assist  Transfers Overall transfer level: Needs assistance Equipment used: Rolling walker (2 wheeled) Transfers: Sit to/from Stand Sit to Stand: Min assist;Min guard         General transfer comment: cues for LE management and use of UEs to self assist  Ambulation/Gait Ambulation/Gait assistance: Min assist;Min guard Ambulation Distance (Feet): 80 Feet (and 15' into bathroom) Assistive device: Rolling walker (2 wheeled) Gait Pattern/deviations: Step-to pattern;Step-through pattern;Decreased step length - right;Decreased step length - left;Shuffle;Trunk flexed Gait velocity: decr Gait velocity interpretation: Below normal speed for age/gender General Gait Details: cues for posture, position from RW and initial sequence  Stairs            Wheelchair Mobility    Modified Rankin (Stroke Patients Only)       Balance  Overall balance assessment: No apparent balance deficits (not formally assessed)                                           Pertinent Vitals/Pain Pain Assessment: 0-10 Pain Score: 5  Pain Location: R knee Pain Descriptors / Indicators: Aching;Sore Pain Intervention(s): Limited activity within patient's tolerance;Monitored during session;Premedicated before session;Ice applied    Home Living Family/patient expects to be discharged to:: Private residence Living Arrangements: Alone Available Help at Discharge: Family Type of Home: House Home Access: Stairs to enter Entrance Stairs-Rails: Right Entrance Stairs-Number of Steps: 5 Home Layout: One level Home Equipment: Environmental consultantWalker - 2 wheels;Cane - single point      Prior Function Level of Independence: Independent               Hand Dominance        Extremity/Trunk Assessment   Upper Extremity Assessment Upper Extremity Assessment: Overall WFL for tasks assessed    Lower Extremity Assessment Lower Extremity Assessment: RLE deficits/detail       Communication   Communication: No difficulties  Cognition Arousal/Alertness: Awake/alert Behavior During Therapy: WFL for tasks assessed/performed Overall Cognitive Status: Within Functional Limits for tasks assessed                                        General Comments      Exercises Total Joint Exercises  Ankle Circles/Pumps: AROM;Both;15 reps;Supine   Assessment/Plan    PT Assessment Patient needs continued PT services  PT Problem List Decreased strength;Decreased range of motion;Decreased activity tolerance;Decreased mobility;Decreased knowledge of use of DME;Obesity;Pain       PT Treatment Interventions DME instruction;Gait training;Stair training;Functional mobility training;Therapeutic activities;Therapeutic exercise;Patient/family education    PT Goals (Current goals can be found in the Care Plan section)  Acute Rehab PT  Goals Patient Stated Goal: Regain IND and walk with less pain PT Goal Formulation: With patient Time For Goal Achievement: 08/02/16 Potential to Achieve Goals: Good    Frequency 7X/week   Barriers to discharge        Co-evaluation               AM-PAC PT "6 Clicks" Daily Activity  Outcome Measure Difficulty turning over in bed (including adjusting bedclothes, sheets and blankets)?: A Little Difficulty moving from lying on back to sitting on the side of the bed? : A Little Difficulty sitting down on and standing up from a chair with arms (e.g., wheelchair, bedside commode, etc,.)?: A Little Help needed moving to and from a bed to chair (including a wheelchair)?: A Little Help needed walking in hospital room?: A Little Help needed climbing 3-5 steps with a railing? : A Little 6 Click Score: 18    End of Session Equipment Utilized During Treatment: Gait belt;Right knee immobilizer Activity Tolerance: Patient tolerated treatment well Patient left: in chair;with call bell/phone within reach;with chair alarm set Nurse Communication: Mobility status PT Visit Diagnosis: Difficulty in walking, not elsewhere classified (R26.2)    Time: 1610-9604 PT Time Calculation (min) (ACUTE ONLY): 35 min   Charges:   PT Evaluation $PT Eval Low Complexity: 1 Procedure PT Treatments $Gait Training: 8-22 mins   PT G Codes:        Pg [phone]    Holley Kocurek 07/29/2016, 6:27 PM

## 2016-07-29 NOTE — Transfer of Care (Signed)
Immediate Anesthesia Transfer of Care Note  Patient: Stefanie DeemBonnie D Dockery  Procedure(s) Performed: Procedure(s): RIGHT KNEE POLY-LINER EXCHANGE (Right)  Patient Location: PACU  Anesthesia Type:General and regional  Level of Consciousness:  sedated, patient cooperative and responds to stimulation  Airway & Oxygen Therapy:Patient Spontanous Breathing and Patient connected to face mask oxgen  Post-op Assessment:  Report given to PACU RN and Post -op Vital signs reviewed and stable  Post vital signs:  Reviewed and stable  Last Vitals:  Vitals:   07/29/16 0521  BP: (!) 176/84  Pulse: 72  Resp: (!) 72  Temp: 36.8 C    Complications: No apparent anesthesia complications

## 2016-07-29 NOTE — Brief Op Note (Signed)
07/29/2016  8:44 AM  PATIENT:  [name]  66 y.o. female  PRE-OPERATIVE DIAGNOSIS:  painful right total knee  POST-OPERATIVE DIAGNOSIS:  painful right total knee  PROCEDURE:  Procedure(s): RIGHT KNEE POLY-LINER EXCHANGE (Right)  SURGEON:  Surgeon(s) and Role:    * Kathryne HitchBlackman, Purvis Sidle Y, MD - Primary  PHYSICIAN ASSISTANT: Rexene EdisonGil Clark, PA-C  ANESTHESIA:   regional and general  EBL:  50 - 100 cc  COUNTS:  YES  TOURNIQUET:   Total Tourniquet Time Documented: Thigh (Right) - 29 minutes Total: Thigh (Right) - 29 minutes   DICTATION: .Other Dictation: Dictation Number [id number]  PLAN OF CARE: Admit to inpatient   PATIENT DISPOSITION:  PACU - hemodynamically stable.   Delay start of Pharmacological VTE agent (>24hrs) due to surgical blood loss or risk of bleeding: no

## 2016-07-29 NOTE — H&P (Signed)
TOTAL KNEE REVISION ADMISSION H&P  Patient is being admitted for right revision total knee arthroplasty.  Subjective:  Chief Complaint:right knee pain.  HPI: [name], 66 y.o. female, has a history of pain and functional disability in the right knee(s) due to ligamentous laxity and patient has failed non-surgical conservative treatments for greater than 12 weeks to include flexibility and strengthening excercises, use of assistive devices, weight reduction as appropriate and activity modification. The indications for the revision of the total knee arthroplasty are bearing surface wear leading to symptomatic synovitis and tibiofemoral instability. Onset of symptoms was gradual starting 3 years ago with gradually worsening course since that time.  Prior procedures on the right knee(s) include arthroplasty.  Patient currently rates pain in the right knee(s) at 10 out of 10 with activity. There is worsening of pain with activity and weight bearing, pain that interferes with activities of daily living and pain with passive range of motion This condition presents safety issues increasing the risk of falls  There is no current active infection.  Patient Active Problem List   Diagnosis Date Noted  . 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 Diabetes (HCC) 01/08/2014  . Essential hypertension 01/08/2014  . Hyperlipidemia 01/08/2014  . Postoperative anemia due to acute blood loss 11/22/2012  . Hyponatremia 11/06/2012  . OA (osteoarthritis) of knee 11/05/2012  . Villonodular synovitis of knee 04/20/2011   Past Medical History:  Diagnosis Date  . Anemia    hx of  . Arthritis   . Depression   . Diabetes mellitus ORAL MEDS  . GERD (gastroesophageal reflux disease)    occasional  . History of kidney stones   . Hyperlipidemia   . Hypertension   . Hypothyroidism   . Knee pain, right   . 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 OSA on CPAP   .  Osteoarthritis   . Pinched nerve in neck   . Pneumonia    hx of  . PONV (postoperative nausea and vomiting)    for 3-4 days after general anesthesia  . Swelling of knee joint, right   . Synovitis of knee RIGHT    Past Surgical History:  Procedure Laterality Date  . CESAREAN SECTION  X3  . KNEE ARTHROSCOPY  04/20/2011   Procedure: ARTHROSCOPY KNEE;  Surgeon: Loanne Drilling, MD;  Location: [facility];  Service: Orthopedics;  Laterality: Right;  WITH SYNOVECTOMY  . LEFT CARPAL TUNNEL / LEFT MIDDLE & RING FINGER TRIGGER RELEASE  08-26-2008  . LEFT SHOULDER ARTHROSCOPY W/ DEBRIDEMENT  09-09-2003  . LEFT SHOULDER ARTHROSCOPY/ LEFT THUMB TRIGGER RELEASE  02-22-2005  . PULLEY RELEASE LEFT LONG FINGER  07-14-2009  . RIGHT CARPAL TUNNEL/ RIGHT THUMB TRIGGER RELEASE'S  11-28-2006  . RIGHT SHOULDER ARTHROSCOPY W/ ROTATOR CUFF REPAIR  01-13-2004  . SHOULDER ARTHROSCOPY DISTAL CLAVICLE EXCISION AND OPEN ROTATOR CUFF REPAIR  09-07-2004   LEFT  . SHOULDER ARTHROSCOPY W/ ACROMIAL REPAIR  11-29-2005   LEFT  . TONSILLECTOMY AND ADENOIDECTOMY  child  . TOTAL KNEE ARTHROPLASTY  12-14-2009   RIGHT  . TOTAL KNEE ARTHROPLASTY Left 11/05/2012   Procedure: LEFT TOTAL KNEE ARTHROPLASTY;  Surgeon: Loanne Drilling, MD;  Location: [facility];  Service: Orthopedics;  Laterality: Left;  . TRIGGER FINGER RELEASE Right 02/21/2013   Procedure: RIGHT RING A-1 PULLEY RELEASE    (MINOR PROCEDURE) ;  Surgeon: Wyn Forster., MD;  Location: [facility];  Service:  Orthopedics;  Laterality: Right;    Prescriptions Prior to Admission  Medication Sig Dispense Refill Last Dose  . amLODipine (NORVASC) 5 MG tablet Take 5 mg by mouth daily.    07/28/2016 at Unknown time  . atorvastatin (LIPITOR) 40 MG tablet Take 40 mg by mouth every evening.    07/28/2016 at Unknown time  . cholecalciferol (VITAMIN D) 1000 units tablet Take 1,000 Units by mouth every evening.    07/28/2016 at Unknown time  .  DULoxetine (CYMBALTA) 60 MG capsule Take 60 mg by mouth daily.   07/28/2016 at Unknown time  . flecainide (TAMBOCOR) 50 MG tablet Take 1 tablet (50 mg total) by mouth 2 (two) times daily. 180 tablet 3 07/28/2016 at Unknown time  . insulin glargine (LANTUS) 100 UNIT/ML injection Inject 60 Units into the skin 2 (two) times daily.    07/28/2016 at Unknown time  . insulin lispro (HUMALOG) 100 UNIT/ML injection Inject 20 Units into the skin daily before supper. Hold if blood sugar is 140 or less   07/28/2016 at Unknown time  . levothyroxine (SYNTHROID, LEVOTHROID) 100 MCG tablet Take 100 mcg by mouth daily before breakfast.   2 07/29/2016 at Unknown time  . Liraglutide (VICTOZA) 18 MG/3ML SOLN Inject 1.8 mg into the skin every evening.    07/28/2016 at Unknown time  . metFORMIN (GLUCOPHAGE-XR) 500 MG 24 hr tablet Take 1,000 mg by mouth daily with supper.   07/28/2016 at Unknown time  . metoprolol tartrate (LOPRESSOR) 25 MG tablet Take 1 tablet (25 mg total) by mouth 2 (two) times daily. 180 tablet 3 07/29/2016 at Unknown time  . omeprazole (PRILOSEC) 20 MG capsule Take 20 mg by mouth at bedtime.    07/28/2016 at Unknown time  . rivaroxaban (XARELTO) 20 MG TABS tablet Take 1 tablet (20 mg total) by mouth daily with supper. 30 tablet 0 07/26/2016  . valsartan-hydrochlorothiazide (DIOVAN-HCT) 320-12.5 MG tablet Take 1 tablet by mouth daily.   07/29/2016 at Unknown time  . indomethacin (INDOCIN) 50 MG capsule Take 50 mg by mouth 3 (three) times daily as needed (gout).   More than a month at Unknown time  . Melatonin 5 MG CAPS Take 5 mg by mouth at bedtime as needed (sleep).    More than a month at Unknown time  . methocarbamol (ROBAXIN) 500 MG tablet Take 500 mg by mouth 3 (three) times daily as needed for muscle spasms.   More than a month at Unknown time   Allergies  Allergen Reactions  . Actos [Pioglitazone Hydrochloride] Swelling and Other (See Comments)    Numbness in lips, HEADACHES  . Avandia [Rosiglitazone]  Swelling    Numbness in lips, headaches   . Tetanus Toxoids Other (See Comments)    Arm swelling, fainted, ?Respiratory distress  (horse serum)  . Food Nausea Only    Eggplant    Social History  Substance Use Topics  . Smoking status: Former Smoker    Years: 5.00    Types: Cigarettes    Quit date: 04/12/1977  . Smokeless tobacco: Never Used  . Alcohol use No    Family History  Problem Relation Age of Onset  . Diabetes Mother   . Hypertension Mother   . Heart attack Mother       Review of Systems  Musculoskeletal: Positive for joint pain.  All other systems reviewed and are negative.    Objective:  Physical Exam  Constitutional: [name] is oriented to person, place, and time. [name] appears well-developed  and well-nourished.  HENT:  Head: Normocephalic and atraumatic.  Eyes: EOM are normal. Pupils are equal, round, and reactive to light.  Neck: Normal range of motion. Neck supple.  Cardiovascular: Normal rate and regular rhythm.   Respiratory: Effort normal and breath sounds normal.  GI: Soft. Bowel sounds are normal.  Musculoskeletal:       Right knee: [name] exhibits LCL laxity and MCL laxity.  Neurological: [name] is alert and oriented to person, place, and time.  Skin: Skin is warm and dry.  Psychiatric: [name] has a normal mood and affect.    Vital signs in last 24 hours: Temp:  [98.3 F (36.8 C)] 98.3 F (36.8 C) (05/18 0521) Pulse Rate:  [72] 72 (05/18 0521) Resp:  [72] 72 (05/18 0521) BP: (176)/(84) 176/84 (05/18 0521) SpO2:  [97 %] 97 % (05/18 0521) Weight:  [265 lb (120.2 kg)] 265 lb (120.2 kg) (05/18 0543)  Labs:  Estimated body mass index is 48.47 kg/m as calculated from the following:   Height as of this encounter: 5\' 2"  (1.575 m).   Weight as of this encounter: 265 lb (120.2 kg).  Imaging Review Plain radiographs demonstrate intact implants and no gross evidence of loosening.  Assessment/Plan:  Ligamentously lax knee with instability multiple years post  right total knee replacement.  To the OR today for likely up-sizing of her poly-liner versus a total revision of the components.  Risks and benefits have been discussed in detail.

## 2016-07-29 NOTE — Progress Notes (Addendum)
Rt placed pt on CPAP in PACU at 09:00 am. Vitals stable no distress noted.

## 2016-07-29 NOTE — Anesthesia Procedure Notes (Signed)
Anesthesia Regional Block: Adductor canal block   Pre-Anesthetic Checklist: ,, timeout performed, Correct Patient, Correct Site, Correct Laterality, Correct Procedure, Correct Position, site marked, Risks and benefits discussed,  Surgical consent,  Pre-op evaluation,  At surgeon's request and post-op pain management  Laterality: Right  Prep: chloraprep       Needles:  Injection technique: Single-shot  Needle Type: Echogenic Needle     Needle Length: 9cm  Needle Gauge: 21     Additional Needles:   Procedures: ultrasound guided,,,,,,,,  Narrative:  Start time: 07/29/2016 7:03 AM End time: 07/29/2016 7:08 AM Injection made incrementally with aspirations every 5 mL.  Performed by: Personally  Anesthesiologist: Cecile HearingURK, STEPHEN EDWARD  Additional Notes: No pain on injection. No increased resistance to injection. Injection made in 5cc increments.  Good needle visualization.  Patient tolerated procedure well.

## 2016-07-29 NOTE — Anesthesia Procedure Notes (Signed)
Procedure Name: Intubation Date/Time: 07/29/2016 7:41 AM Performed by: West Pugh Pre-anesthesia Checklist: Patient identified, Emergency Drugs available, Suction available, Patient being monitored and Timeout performed Patient Re-evaluated:Patient Re-evaluated prior to inductionOxygen Delivery Method: Circle system utilized Preoxygenation: Pre-oxygenation with 100% oxygen Intubation Type: IV induction Ventilation: Mask ventilation without difficulty Laryngoscope Size: Mac and 4 Grade View: Grade II Tube type: Oral Tube size: 7.0 mm Number of attempts: 1 Airway Equipment and Method: Stylet Placement Confirmation: ETT inserted through vocal cords under direct vision,  positive ETCO2,  CO2 detector and breath sounds checked- equal and bilateral Secured at: 22 cm Tube secured with: Tape Dental Injury: Teeth and Oropharynx as per pre-operative assessment

## 2016-07-29 NOTE — Op Note (Signed)
NAMEMarland Kitchen  [name], LAMB NO.:  [id number]  MEDICAL RECORD NO.:  [id number]  LOCATION:  [facility]                         FACILITY:  [facility]  PHYSICIAN:  Stefanie GonzalesDATE OF BIRTH:  Apr 14, 1950  DATE OF PROCEDURE:  07/29/2016 DATE OF DISCHARGE:                              OPERATIVE REPORT   PREOPERATIVE DIAGNOSIS:  Right knee with polyethylene liner wear and symptomatic synovitis with ligamentous instability.  POSTOPERATIVE DIAGNOSIS:  Right knee with polyethylene liner wear and symptomatic synovitis with ligamentous instability.  PROCEDURE:  Right knee polyethylene liner exchange with partial synovectomy.  IMPLANTS:  We went from a size 10 thickness polyethylene liner to a size 15 thickness polyethylene liner.  SURGEON:  [name]. Stefanie Ivan, MD.  ASSISTANT:  [name].  ANESTHESIA: 1. Right lower extremity adductor Gonzales block. 2. General.  ANTIBIOTICS:  IV Ancef 2 g.  BLOOD LOSS:  50 to 100 mL.  COMPLICATIONS:  None.  INDICATIONS:  [name] is a very pleasant 66 year old female, who is moderately obese.  She has had bilateral knee replacements done by another surgeon in town.  The left knee has done great.  The right knee, she has had complaints of instability of that knee with going up and down stairs and just pain in general.  She eventually sought me out as a second opinion and on examination in the office, I felt that the knee was lax ligamentously.  She seemed to hyperextend a little more and there was certainly play in mid flexion with drawer sign as well as varus and valgus stressing.  There was a slight effusion as well.  I have reviewed x-rays of her knee when compared to the right and left knees in the operative note and she definitely had a 12.5 mm thickness poly on her left side.  That knee has given her no problem, and only 10 mm thickness on the right side.  I felt that there was likely polyethylene wear with  synovitis and I obvious instability of the knee.  We talked about the risks and benefits of surgery, what her options were, and she did wish to proceed with a knee procedure.  I recommended at the minimum a partial synovectomy and a polyethylene liner exchange with upsizing her poly where she would get full-blown revision of all components pending our intraoperative findings.  Risks and benefits were understood and informed consent was obtained.  PROCEDURE DESCRIPTION:  After informed consent was obtained, appropriate right knee was marked, anesthesia obtained an adductor Gonzales block.  She was brought to the operating room, placed on the operating table. General anesthesia was then obtained.  A nonsterile tourniquet was placed around her upper right thigh and her right leg was prepped and draped from the thigh down the ankle with DuraPrep and sterile drapes including sterile stockinette.  Time-out was called and she was identified as correct patient and correct right knee.  We then used Esmarch to wrap out the leg and tourniquet inflated 300 mm of pressure. We then made a midline incision over the patella and carried this proximally and distally through her old incision.  We dissected down the knee joint and  carried out a medial parapatellar arthrotomy.  Right away, we had a mild-to-moderate knee effusion, but it was clear.  However, this synovium was definitely gray consistent with polyethylene liner wear and some slight metallosis.  With the knee bringing it to a flexed position, we could see that the knee was unstable and the polyethylene rotated more out from underneath the medial femoral condyle and removed that poly liner easily.  You could see there was some medial wear as well as scalloping of the medial side. I then performed a partial synovectomy throughout the knee.  We assessed the femoral component and the tibial component did not find them to be loose.  I then irrigated  the knee with 3 L normal saline solution.  We then trialed polys from a size 10 to next 12.5 and then a 15.  The 15 still gave us almost full extension and it was stable with varus and valgus stressing with a negative drawer sign.  We assessed the posterior aspect of the knee, and we did find some remnants of the medial meniscus as well.  Once we cleaned out the knee, we then went the real 50 mm polyethylene thickness for a size 2.5 tibial tray from [employer].  This was a rotating platform polyethylene liner.  We placed the real liner in the knee and we felt great about stability.  We then irrigated the knee again with normal saline solution using pulsatile lavage.  We let the tourniquet down and hemostasis was obtained with electrocautery.  We closed our arthrotomy with interrupted #1 Vicryl suture, followed by 0 Vicryl in the deep tissue, 2-0 Vicryl subcutaneous tissue, interrupted staples on the skin.  Well-padded sterile dressing was applied.  She was awakened, extubated, and taken to recovery room in stable condition.  All final counts were correct. There were no complications noted.  Of note, Stefanie CanalGilbert Clark, Gonzales, assisted in the entire case.  His assistance was crucial for facilitating all aspects of this case.     [name], [name]     CYB/MEDQ  D:  07/29/2016  T:  07/29/2016  Job:  [id number]

## 2016-07-29 NOTE — Anesthesia Postprocedure Evaluation (Signed)
Anesthesia Post Note  Patient: [name]  Procedure(s) Performed: Procedure(s) (LRB): RIGHT KNEE POLY-LINER EXCHANGE (Right)  Patient location during evaluation: PACU Anesthesia Type: General Level of consciousness: awake and alert Pain management: pain level controlled Vital Signs Assessment: post-procedure vital signs reviewed and stable Respiratory status: spontaneous breathing, nonlabored ventilation, respiratory function stable and patient connected to nasal cannula oxygen Cardiovascular status: blood pressure returned to baseline and stable Postop Assessment: no signs of nausea or vomiting Anesthetic complications: no       Last Vitals:  Vitals:   07/29/16 1332 07/29/16 1745  BP: (!) 138/55 128/65  Pulse: 74 73  Resp: 16 15  Temp: 36.7 C 36.9 C    Last Pain:  Vitals:   07/29/16 1745  TempSrc: Oral  PainSc:                  Cecile HearingStephen Edward Annalyn Blecher

## 2016-07-30 LAB — GLUCOSE, CAPILLARY
GLUCOSE-CAPILLARY: 196 mg/dL — AB (ref 65–99)
GLUCOSE-CAPILLARY: 186 mg/dL — AB (ref 65–99)
Glucose-Capillary: 162 mg/dL — ABNORMAL HIGH (ref 65–99)
Glucose-Capillary: 143 mg/dL — ABNORMAL HIGH (ref 65–99)
Glucose-Capillary: 69 mg/dL (ref 65–99)

## 2016-07-30 MED ORDER — OXYCODONE-ACETAMINOPHEN 5-325 MG PO TABS: 1.0000 | ORAL_TABLET | ORAL | 0 refills | Status: DC | PRN

## 2016-07-30 NOTE — Care Management Note (Signed)
Case Management Note  Patient Details  Name: [name] MRN: [id number] Date of Birth: 1950/12/26  Subjective/Objective:     S/p Right TKA               Action/Plan: Discharge Planning: [facility]. Contacted [facility] to make aware. Pt reports she has RW and 3n1 for home. Contacted [facility] DME rep with new referral.    PCP Marden NobleGATES, ROBERT MD  Expected Discharge Date:                  Expected Discharge Plan:  Home w Home Health Services  In-House Referral:  NA  Discharge planning Services  CM Consult  Post Acute Care Choice:  Home Health Choice offered to:  Patient  DME Arranged:  N/A DME Agency:  NA  HH Arranged:  PT HH Agency:  [facility]  Status of Service:  Completed, signed off  If discussed at Long Length of Stay Meetings, dates discussed:    Additional Comments:  Elliot CousinShavis, Saher Davee Ellen, RN 07/30/2016, 3:37 PM

## 2016-07-30 NOTE — Progress Notes (Signed)
OT Cancellation Note  Patient Details Name: [name] MRN: [id number] DOB: 1951/01/16   Cancelled Treatment:    Reason Eval/Treat Not Completed: OT screened, no needs identified, will sign off. Pt with hx of TKA and has all necessary equipment. Pt is knowledgeable in safe footwear, transporting items safely with RW and technique for shower transfer. Recommended shower seat vs 3 in 1, but pt declined. Will have cousin available to assist at home.   [name], [name] [name] 07/30/2016, 1:57 PM  [phone]

## 2016-07-30 NOTE — Discharge Instructions (Signed)

## 2016-07-30 NOTE — Progress Notes (Signed)
Physical Therapy Treatment Patient Details Name: [name] MRN: [id number] DOB: 27-Mar-1950 Today's Date: 07/30/2016    History of Present Illness Pt s/p R TKR revision and with hx of DM and Bil TKR    PT Comments    Pt progressing well with mobility and hopeful for dc home tomorrow.   Follow Up Recommendations  Home health PT     Equipment Recommendations  None recommended by PT    Recommendations for Other Services OT consult     Precautions / Restrictions Precautions Precautions: Fall Required Braces or Orthoses: Knee Immobilizer - Right Knee Immobilizer - Right: Discontinue once straight leg raise with < 10 degree lag Restrictions Weight Bearing Restrictions: No Other Position/Activity Restrictions: WBAT    Mobility  Bed Mobility Overal bed mobility: Needs Assistance Bed Mobility: Supine to Sit     Supine to sit: Min guard     General bed mobility comments: min cues for sequence and use of L LE to self assist  Transfers Overall transfer level: Needs assistance Equipment used: Rolling walker (2 wheeled) Transfers: Sit to/from Stand Sit to Stand: Supervision         General transfer comment: min cues for LE management and use of UEs to self assist  Ambulation/Gait Ambulation/Gait assistance: Min guard Ambulation Distance (Feet): 200 Feet Assistive device: Rolling walker (2 wheeled) Gait Pattern/deviations: Step-to pattern;Step-through pattern;Decreased step length - right;Decreased step length - left;Shuffle;Trunk flexed Gait velocity: decr Gait velocity interpretation: Below normal speed for age/gender General Gait Details: min cues for posture, position from RW and initial sequence   Stairs Stairs: Yes   Stair Management: One rail Left;Forwards;Step to pattern;With cane Number of Stairs: 10 General stair comments: 2 steps twice and 3 steps twice with cues for foot/cane placement and sequence.  Wheelchair Mobility    Modified Rankin  (Stroke Patients Only)       Balance Overall balance assessment: No apparent balance deficits (not formally assessed)                                          Cognition Arousal/Alertness: Awake/alert Behavior During Therapy: WFL for tasks assessed/performed Overall Cognitive Status: Within Functional Limits for tasks assessed                                        Exercises      General Comments        Pertinent Vitals/Pain Pain Assessment: 0-10 Pain Score: 5  Pain Location: R knee Pain Descriptors / Indicators: Aching;Sore Pain Intervention(s): Limited activity within patient's tolerance;Monitored during session;Premedicated before session;Ice applied    Home Living                      Prior Function            PT Goals (current goals can now be found in the care plan section) Acute Rehab PT Goals Patient Stated Goal: Regain IND and walk with less pain PT Goal Formulation: With patient Time For Goal Achievement: 08/02/16 Potential to Achieve Goals: Good Progress towards PT goals: Progressing toward goals    Frequency    7X/week      PT Plan Current plan remains appropriate    Co-evaluation  AM-PAC PT "6 Clicks" Daily Activity  Outcome Measure  Difficulty turning over in bed (including adjusting bedclothes, sheets and blankets)?: A Little Difficulty moving from lying on back to sitting on the side of the bed? : A Little Difficulty sitting down on and standing up from a chair with arms (e.g., wheelchair, bedside commode, etc,.)?: A Little Help needed moving to and from a bed to chair (including a wheelchair)?: A Little Help needed walking in hospital room?: A Little Help needed climbing 3-5 steps with a railing? : A Little 6 Click Score: 18    End of Session Equipment Utilized During Treatment: Gait belt;Right knee immobilizer Activity Tolerance: Patient tolerated treatment well Patient  left: in chair;with call bell/phone within reach;with family/visitor present Nurse Communication: Mobility status PT Visit Diagnosis: Difficulty in walking, not elsewhere classified (R26.2)     Time: 1610-9604 PT Time Calculation (min) (ACUTE ONLY): 23 min  Charges:  $Gait Training: 23-37 mins                    G Codes:       Pg [phone]    [name] 07/30/2016, 4:08 PM

## 2016-07-30 NOTE — Progress Notes (Signed)
Physical Therapy Treatment Patient Details Name: Stefanie DeemBonnie D Diez MRN: [id number] DOB: 1950/11/08 Today's Date: 07/30/2016    History of Present Illness Pt s/p R TKR revision and with hx of DM and Bil TKR    PT Comments    Pt motivated and progressing well with mobility.   Follow Up Recommendations  Home health PT     Equipment Recommendations  None recommended by PT    Recommendations for Other Services OT consult     Precautions / Restrictions Precautions Precautions: Fall Required Braces or Orthoses: Knee Immobilizer - Right Knee Immobilizer - Right: Discontinue once straight leg raise with < 10 degree lag Restrictions Weight Bearing Restrictions: No Other Position/Activity Restrictions: WBAT    Mobility  Bed Mobility Overal bed mobility: Needs Assistance Bed Mobility: Sit to Supine       Sit to supine: Min guard   General bed mobility comments: min cues for sequence and use of L LE to self assist  Transfers Overall transfer level: Needs assistance Equipment used: Rolling walker (2 wheeled) Transfers: Sit to/from Stand Sit to Stand: Min guard         General transfer comment: cues for LE management and use of UEs to self assist  Ambulation/Gait Ambulation/Gait assistance: Min guard Ambulation Distance (Feet): 100 Feet Assistive device: Rolling walker (2 wheeled) Gait Pattern/deviations: Step-to pattern;Step-through pattern;Decreased step length - right;Decreased step length - left;Shuffle;Trunk flexed Gait velocity: decr Gait velocity interpretation: Below normal speed for age/gender General Gait Details: cues for posture, position from RW and initial sequence   Stairs            Wheelchair Mobility    Modified Rankin (Stroke Patients Only)       Balance Overall balance assessment: No apparent balance deficits (not formally assessed)                                          Cognition Arousal/Alertness:  Awake/alert Behavior During Therapy: WFL for tasks assessed/performed Overall Cognitive Status: Within Functional Limits for tasks assessed                                        Exercises Total Joint Exercises Ankle Circles/Pumps: AROM;Both;15 reps;Supine Quad Sets: AROM;Both;15 reps;Supine Heel Slides: AAROM;Right;15 reps;Supine Straight Leg Raises: AAROM;Right;10 reps;Supine Goniometric ROM: AAROM R knee -5 - 75    General Comments        Pertinent Vitals/Pain Pain Assessment: 0-10 Pain Score: 6  Pain Location: R knee Pain Descriptors / Indicators: Aching;Sore Pain Intervention(s): Limited activity within patient's tolerance;Monitored during session;Premedicated before session;Ice applied    Home Living                      Prior Function            PT Goals (current goals can now be found in the care plan section) Acute Rehab PT Goals Patient Stated Goal: Regain IND and walk with less pain PT Goal Formulation: With patient Time For Goal Achievement: 08/02/16 Potential to Achieve Goals: Good Progress towards PT goals: Progressing toward goals    Frequency    7X/week      PT Plan Current plan remains appropriate    Co-evaluation  AM-PAC PT "6 Clicks" Daily Activity  Outcome Measure  Difficulty turning over in bed (including adjusting bedclothes, sheets and blankets)?: A Little Difficulty moving from lying on back to sitting on the side of the bed? : A Little Difficulty sitting down on and standing up from a chair with arms (e.g., wheelchair, bedside commode, etc,.)?: A Little Help needed moving to and from a bed to chair (including a wheelchair)?: A Little Help needed walking in hospital room?: A Little Help needed climbing 3-5 steps with a railing? : A Little 6 Click Score: 18    End of Session Equipment Utilized During Treatment: Gait belt;Right knee immobilizer Activity Tolerance: Patient tolerated  treatment well Patient left: with call bell/phone within reach;with chair alarm set;in bed Nurse Communication: Mobility status PT Visit Diagnosis: Difficulty in walking, not elsewhere classified (R26.2)     Time: 1610-9604 PT Time Calculation (min) (ACUTE ONLY): 35 min  Charges:  $Gait Training: 8-22 mins $Therapeutic Exercise: 8-22 mins                    G Codes:       Pg [phone]    Solon Alban 07/30/2016, 12:45 PM

## 2016-07-30 NOTE — Progress Notes (Signed)
Subjective: 1 Day Post-Op Procedure(s) (LRB): RIGHT KNEE POLY-LINER EXCHANGE (Right) Patient reports pain as moderate.    Objective: Vital signs in last 24 hours: Temp:  [97.9 F (36.6 C)-98.5 F (36.9 C)] 98.5 F (36.9 C) (05/19 0518) Pulse Rate:  [72-78] 72 (05/19 0518) Resp:  [15-17] 17 (05/19 0518) BP: (128-163)/(52-72) 152/72 (05/19 0518) SpO2:  [92 %-98 %] 92 % (05/19 0518)  Intake/Output from previous day: 05/18 0701 - 05/19 0700 In: 2581.3 [P.O.:640; I.V.:1761.3; IV Piggyback:55] Out: 4750 [Urine:4750] Intake/Output this shift: Total I/O In: 480 [P.O.:480] Out: 1100 [Urine:1100]  No results for input(s): HGB in the last 72 hours. No results for input(s): WBC, RBC, HCT, PLT in the last 72 hours. No results for input(s): NA, K, CL, CO2, BUN, CREATININE, GLUCOSE, CALCIUM in the last 72 hours. No results for input(s): LABPT, INR in the last 72 hours.  Sensation intact distally Intact pulses distally Dorsiflexion/Plantar flexion intact Incision: scant drainage No cellulitis present Compartment soft  Assessment/Plan: 1 Day Post-Op Procedure(s) (LRB): RIGHT KNEE POLY-LINER EXCHANGE (Right) Up with therapy Plan for discharge tomorrow Discharge home with home health  Stefanie HitchChristopher Y Conor Gonzales 07/30/2016, 1:44 PM

## 2016-07-31 LAB — GLUCOSE, CAPILLARY
Glucose-Capillary: 111 mg/dL — ABNORMAL HIGH (ref 65–99)
Glucose-Capillary: 214 mg/dL — ABNORMAL HIGH (ref 65–99)

## 2016-07-31 NOTE — Progress Notes (Signed)
Physical Therapy Treatment Patient Details Name: [name] MRN: [id number] DOB: 11/05/1950 Today's Date: 07/31/2016    History of Present Illness Pt s/p R TKR revision and with hx of DM and Bil TKR    PT Comments    POD # 2 Applied KI and instructed on use.  Assisted with amb in hallway then practiced stairs using one rail and one crutch.  Returned to room then performed R knee TE's given HEP and instructed on freq, proper tech and use of ICE.   All mobility questions addressed.  Pt ready to D/C to home.    Follow Up Recommendations  Home health PT     Equipment Recommendations  None recommended by PT    Recommendations for Other Services       Precautions / Restrictions Precautions Precautions: Fall Precaution Comments: instructed on KI use and when to D/C Required Braces or Orthoses: Knee Immobilizer - Right Knee Immobilizer - Right: Discontinue once straight leg raise with < 10 degree lag Restrictions Weight Bearing Restrictions: No Other Position/Activity Restrictions: WBAT    Mobility  Bed Mobility               General bed mobility comments: OOB in recliner  Transfers Overall transfer level: Needs assistance Equipment used: Rolling walker (2 wheeled) Transfers: Sit to/from Stand Sit to Stand: Supervision;Min guard         General transfer comment: only assist needed was supporting R LE  Ambulation/Gait Ambulation/Gait assistance: Supervision;Min guard Ambulation Distance (Feet): 185 Feet Assistive device: Rolling walker (2 wheeled) Gait Pattern/deviations: Step-to pattern;Step-through pattern;Decreased step length - right;Decreased step length - left;Shuffle;Trunk flexed Gait velocity: decr   General Gait Details: min cues for posture, position from RW and initial sequence   Stairs Stairs: Yes   Stair Management: One rail Left;Forwards;Step to pattern;With cane Number of Stairs: 4 General stair comments: only required one VC on safety  with cane placement.  Performed well.  KI on  Wheelchair Mobility    Modified Rankin (Stroke Patients Only)       Balance                                            Cognition Arousal/Alertness: Awake/alert Behavior During Therapy: WFL for tasks assessed/performed Overall Cognitive Status: Within Functional Limits for tasks assessed                                        Exercises   R knee TE's s/p liner exchange 10 reps B LE ankle pumps 10 reps towel squeezes 10 reps knee presses 10 reps heel slides  10 reps SAQ's 10 reps SLR's 10 reps ABD Followed by ICE    General Comments        Pertinent Vitals/Pain Pain Assessment: 0-10 Pain Score: 5  Pain Location: R knee Pain Descriptors / Indicators: Aching;Sore;Operative site guarding Pain Intervention(s): Monitored during session;Repositioned;Ice applied    Home Living                      Prior Function            PT Goals (current goals can now be found in the care plan section) Progress towards PT goals: Progressing toward goals    Frequency  7X/week      PT Plan Current plan remains appropriate    Co-evaluation              AM-PAC PT "6 Clicks" Daily Activity  Outcome Measure  Difficulty turning over in bed (including adjusting bedclothes, sheets and blankets)?: A Little Difficulty moving from lying on back to sitting on the side of the bed? : A Little Difficulty sitting down on and standing up from a chair with arms (e.g., wheelchair, bedside commode, etc,.)?: A Little Help needed moving to and from a bed to chair (including a wheelchair)?: A Little Help needed walking in hospital room?: A Little Help needed climbing 3-5 steps with a railing? : A Little 6 Click Score: 18    End of Session Equipment Utilized During Treatment: Gait belt;Right knee immobilizer Activity Tolerance: Patient tolerated treatment well Patient left: in chair;with call  bell/phone within reach;with family/visitor present Nurse Communication: Mobility status (pt ready for D/C to home) PT Visit Diagnosis: Difficulty in walking, not elsewhere classified (R26.2)     Time: 1610-96041055-1134 PT Time Calculation (min) (ACUTE ONLY): 39 min  Charges:  $Gait Training: 8-22 mins $Therapeutic Exercise: 8-22 mins $Therapeutic Activity: 8-22 mins                    G Codes:       Felecia ShellingLori Khadijah Mastrianni  PTA [facility]  Acute  Rehab Pager      [phone]

## 2016-07-31 NOTE — Progress Notes (Signed)
Subjective: 2 Days Post-Op Procedure(s) (LRB): RIGHT KNEE POLY-LINER EXCHANGE (Right) Patient reports pain as mild.    Objective: Vital signs in last 24 hours: Temp:  [98.2 F (36.8 C)-98.7 F (37.1 C)] 98.4 F (36.9 C) (05/20 0627) Pulse Rate:  [61-62] 62 (05/20 0627) Resp:  [16] 16 (05/20 0627) BP: (115-147)/(55-69) 115/69 (05/20 0627) SpO2:  [98 %-100 %] 100 % (05/20 0627)  Intake/Output from previous day: 05/19 0701 - 05/20 0700 In: 1320 [P.O.:1320] Out: 2400 [Urine:2400] Intake/Output this shift: No intake/output data recorded.  No results for input(s): HGB in the last 72 hours. No results for input(s): WBC, RBC, HCT, PLT in the last 72 hours. No results for input(s): NA, K, CL, CO2, BUN, CREATININE, GLUCOSE, CALCIUM in the last 72 hours. No results for input(s): LABPT, INR in the last 72 hours.  Neurologically intact No cellulitis present Compartment soft  Assessment/Plan: 2 Days Post-Op Procedure(s) (LRB): RIGHT KNEE POLY-LINER EXCHANGE (Right) Up with therapy Discharge home with home health Dressing changed-wound clean and dry. Anxious for discharge with HHPT Valeria Batmaneter W Lindzie Boxx 07/31/2016, 10:23 AM

## 2016-07-31 NOTE — Progress Notes (Signed)
Pt discharged; dc instructions explained to patient in detail; prescriptions provided; pt escorted to lobby in wheel chair via nursing technician

## 2016-08-01 ENCOUNTER — Encounter (HOSPITAL_COMMUNITY): Payer: Self-pay | Admitting: Orthopaedic Surgery

## 2016-08-01 ENCOUNTER — Telehealth (INDEPENDENT_AMBULATORY_CARE_PROVIDER_SITE_OTHER): Payer: Self-pay | Admitting: Orthopaedic Surgery

## 2016-08-01 DIAGNOSIS — M65861 Other synovitis and tenosynovitis, right lower leg: Secondary | ICD-10-CM | POA: Diagnosis not present

## 2016-08-01 DIAGNOSIS — Z96651 Presence of right artificial knee joint: Secondary | ICD-10-CM | POA: Diagnosis not present

## 2016-08-01 DIAGNOSIS — T84062D Wear of articular bearing surface of internal prosthetic right knee joint, subsequent encounter: Secondary | ICD-10-CM | POA: Diagnosis not present

## 2016-08-01 NOTE — Telephone Encounter (Signed)
Verbal order given  

## 2016-08-01 NOTE — Telephone Encounter (Signed)
PT HOME CARE NURSE REQUESTING 3 X WK 1 AND 2X THE FOLLOWING WEEK.  KELLY [phone]

## 2016-08-01 NOTE — Discharge Summary (Signed)
Patient ID: [name] MRN: [id number] DOB/AGE: Apr 27, 1950 66 y.o.  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 Anemia    hx of  . Arthritis   . Depression   . Diabetes mellitus ORAL MEDS  . GERD (gastroesophageal reflux disease)    occasional  . History of kidney stones   . Hyperlipidemia   . Hypertension   . Hypothyroidism   . Knee pain, right   . 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 OSA on CPAP   . Osteoarthritis   . Pinched nerve in neck   . Pneumonia    hx of  . PONV (postoperative nausea and vomiting)    for 3-4 days after general anesthesia  . Swelling of knee joint, right   . Synovitis of knee RIGHT    Surgeries: Procedure(s): RIGHT KNEE POLY-LINER EXCHANGE on 07/29/2016   Consultants:   Discharged Condition: Improved  Hospital Course: [name] is an 66 y.o. female who was admitted 07/29/2016 for operative treatment of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). Patient has severe unremitting pain that affects sleep, daily activities, and work/hobbies. After pre-op clearance the patient was taken to the operating room on 07/29/2016 and underwent  Procedure(s): RIGHT KNEE POLY-LINER EXCHANGE.    Patient was given perioperative antibiotics: Anti-infectives    Start     Dose/Rate Route Frequency Ordered Stop   07/29/16 1330  ceFAZolin (ANCEF) IVPB 2g/100 mL premix     2 g 200 mL/hr over 30 Minutes Intravenous Every 6 hours 07/29/16 1101 07/29/16 2030   07/29/16 0532  ceFAZolin (ANCEF) 3 g in dextrose 5 % 50 mL IVPB     3 g 130 mL/hr over 30 Minutes Intravenous On call to O.R. 07/29/16 0532 07/29/16 0805       Patient was  given sequential compression devices, early ambulation, and chemoprophylaxis to prevent DVT.  Patient benefited maximally from hospital stay and there were no complications.    Recent vital signs: Patient Vitals for the past 24 hrs:  BP Temp Temp src Pulse Resp SpO2  07/31/16 1510 (!) 117/49 - - (!) 59 - -  07/31/16 1334 (!) 105/36 98.6 F (37 C) Oral 64 18 97 %     Recent laboratory studies: No results for input(s): WBC, HGB, HCT, PLT, NA, K, CL, CO2, BUN, CREATININE, GLUCOSE, INR, CALCIUM in the last 72 hours.  Invalid input(s): PT, 2   Discharge Medications:   Allergies as of 07/31/2016      Reactions   Actos [pioglitazone Hydrochloride] Swelling, Other (See Comments)   Numbness in lips, HEADACHES   Avandia [rosiglitazone] Swelling   Numbness in lips, headaches    Tetanus Toxoids Other (See Comments)   Arm swelling, fainted, ?Respiratory distress  (horse serum)   Food Nausea Only   Eggplant      Medication List    TAKE these medications   amLODipine 5 MG tablet Commonly known as:  NORVASC Take 5 mg by mouth daily.   atorvastatin 40 MG tablet Commonly known as:  LIPITOR Take 40 mg by mouth every evening.   cholecalciferol 1000 units tablet Commonly known as:  VITAMIN D Take 1,000 Units by mouth every evening.  DULoxetine 60 MG capsule Commonly known as:  CYMBALTA Take 60 mg by mouth daily.   flecainide 50 MG tablet Commonly known as:  TAMBOCOR Take 1 tablet (50 mg total) by mouth 2 (two) times daily.   indomethacin 50 MG capsule Commonly known as:  INDOCIN Take 50 mg by mouth 3 (three) times daily as needed (gout).   insulin glargine 100 UNIT/ML injection Commonly known as:  LANTUS Inject 60 Units into the skin 2 (two) times daily.   insulin lispro 100 UNIT/ML injection Commonly known as:  HUMALOG Inject 20 Units into the skin daily before supper. Hold if blood sugar is 140 or less   levothyroxine 100 MCG tablet Commonly known as:  SYNTHROID,  LEVOTHROID Take 100 mcg by mouth daily before breakfast.   Melatonin 5 MG Caps Take 5 mg by mouth at bedtime as needed (sleep).   metFORMIN 500 MG 24 hr tablet Commonly known as:  GLUCOPHAGE-XR Take 1,000 mg by mouth daily with supper.   methocarbamol 500 MG tablet Commonly known as:  ROBAXIN Take 500 mg by mouth 3 (three) times daily as needed for muscle spasms.   metoprolol tartrate 25 MG tablet Commonly known as:  LOPRESSOR Take 1 tablet (25 mg total) by mouth 2 (two) times daily.   omeprazole 20 MG capsule Commonly known as:  PRILOSEC Take 20 mg by mouth at bedtime.   oxyCODONE-acetaminophen 5-325 MG tablet Commonly known as:  ROXICET Take 1-2 tablets by mouth every 4 (four) hours as needed.   rivaroxaban 20 MG Tabs tablet Commonly known as:  XARELTO Take 1 tablet (20 mg total) by mouth daily with supper.   valsartan-hydrochlorothiazide 320-12.5 MG tablet Commonly known as:  DIOVAN-HCT Take 1 tablet by mouth daily.   VICTOZA 18 MG/3ML Soln injection Generic drug:  Liraglutide Inject 1.8 mg into the skin every evening.       Diagnostic Studies: Dg Knee Right Port  Result Date: 07/29/2016 CLINICAL DATA:  Postop right knee replacement EXAM: PORTABLE RIGHT KNEE - 1-2 VIEW COMPARISON:  03/16/2016 FINDINGS: Postop changes from right knee replacement. Expected changes of the soft tissues with soft tissue swelling and subcutaneous air. Anterior staples noted. Components appear aligned. No acute osseous finding or hardware abnormality. Knee immobilizer overlies the extremity. IMPRESSION: Expected appearance status post right knee total arthroplasty. Electronically Signed   By: Judie PetitM.  Shick [name]   On: 07/29/2016 09:37    Disposition: 01-Home or Self Care    Follow-up Information    [facility].   Why:  Home Health Physical Therapy Contact information: [address] [place] [address] [phone]        Kathryne HitchBlackman, Christopher  Y, MD Follow up in 2 week(s).   Specialty:  Orthopedic Surgery Contact information: [address] [place] [address] [phone]            Signed: Richardean CanalGILBERT Kennedy Bohanon 08/01/2016, 12:41 PM

## 2016-08-03 DIAGNOSIS — M65861 Other synovitis and tenosynovitis, right lower leg: Secondary | ICD-10-CM | POA: Diagnosis not present

## 2016-08-03 DIAGNOSIS — Z96651 Presence of right artificial knee joint: Secondary | ICD-10-CM | POA: Diagnosis not present

## 2016-08-03 DIAGNOSIS — T84062D Wear of articular bearing surface of internal prosthetic right knee joint, subsequent encounter: Secondary | ICD-10-CM | POA: Diagnosis not present

## 2016-08-05 DIAGNOSIS — Z96651 Presence of right artificial knee joint: Secondary | ICD-10-CM | POA: Diagnosis not present

## 2016-08-05 DIAGNOSIS — M65861 Other synovitis and tenosynovitis, right lower leg: Secondary | ICD-10-CM | POA: Diagnosis not present

## 2016-08-05 DIAGNOSIS — T84062D Wear of articular bearing surface of internal prosthetic right knee joint, subsequent encounter: Secondary | ICD-10-CM | POA: Diagnosis not present

## 2016-08-08 DIAGNOSIS — T84062D Wear of articular bearing surface of internal prosthetic right knee joint, subsequent encounter: Secondary | ICD-10-CM | POA: Diagnosis not present

## 2016-08-08 DIAGNOSIS — M65861 Other synovitis and tenosynovitis, right lower leg: Secondary | ICD-10-CM | POA: Diagnosis not present

## 2016-08-08 DIAGNOSIS — Z96651 Presence of right artificial knee joint: Secondary | ICD-10-CM | POA: Diagnosis not present

## 2016-08-11 ENCOUNTER — Ambulatory Visit (INDEPENDENT_AMBULATORY_CARE_PROVIDER_SITE_OTHER): Payer: 59 | Admitting: Orthopaedic Surgery

## 2016-08-11 DIAGNOSIS — Z96659 Presence of unspecified artificial knee joint: Secondary | ICD-10-CM

## 2016-08-11 DIAGNOSIS — T84068D Wear of articular bearing surface of other internal prosthetic joint, subsequent encounter: Secondary | ICD-10-CM

## 2016-08-11 MED ORDER — METHOCARBAMOL 500 MG PO TABS: 500.0000 mg | ORAL_TABLET | Freq: Four times a day (QID) | ORAL | 0 refills | Status: DC | PRN

## 2016-08-11 MED FILL — METHOCARBAMOL 500 MG TABLET: 500 | 15 days supply | Qty: 60 | Fill #0

## 2016-08-11 NOTE — Progress Notes (Signed)
The patient is following up 2 weeks after a polyliner exchange of her right knee. She is doing well. She is almost a relative but is been on that since even before surgery for chronic issues. She says she feels better overall but would like a refill on Percocet which I agree with. She is ready for outpatient therapy as well. She family with a cane. She's also ready to drive.  On examination her incision looks good. The staples can be removed and Steri-strips are supplied. She is artery got pretty good range of motion of her knee back region his physical therapy. This can be set up [facility]. Her calf is soft and there is no evidence infection.  She will continue increase her activities and I'll let her drive as long she is not taking narcotic. I'll see her back in 4 to see how she doing overall but no x-rays are needed.

## 2016-08-11 NOTE — Addendum Note (Signed)
Addended by: Doneen PoissonBLACKMAN, Yazmen Briones on: 08/11/2016 01:46 PM   Modules accepted: Orders

## 2016-08-12 ENCOUNTER — Other Ambulatory Visit (INDEPENDENT_AMBULATORY_CARE_PROVIDER_SITE_OTHER): Payer: Self-pay

## 2016-08-12 DIAGNOSIS — Z96651 Presence of right artificial knee joint: Secondary | ICD-10-CM

## 2016-08-12 DIAGNOSIS — M65861 Other synovitis and tenosynovitis, right lower leg: Secondary | ICD-10-CM | POA: Diagnosis not present

## 2016-08-12 DIAGNOSIS — T84062D Wear of articular bearing surface of internal prosthetic right knee joint, subsequent encounter: Secondary | ICD-10-CM | POA: Diagnosis not present

## 2016-08-14 MED FILL — LANTUS 100 UNITS/ML VIAL: 100 | 88 days supply | Qty: 80 | Fill #3

## 2016-08-14 MED FILL — VICTOZA 18 MG/3 ML INJECT P: 18 | 30 days supply | Qty: 9 | Fill #8

## 2016-08-15 MED FILL — OXYCODONE-ACETAMINOPHEN 5-3: 5-325 | 5 days supply | Qty: 60 | Fill #0

## 2016-08-16 ENCOUNTER — Ambulatory Visit: Payer: 59 | Attending: Orthopaedic Surgery

## 2016-08-16 DIAGNOSIS — M6281 Muscle weakness (generalized): Secondary | ICD-10-CM | POA: Insufficient documentation

## 2016-08-16 DIAGNOSIS — R262 Difficulty in walking, not elsewhere classified: Secondary | ICD-10-CM | POA: Insufficient documentation

## 2016-08-16 DIAGNOSIS — R600 Localized edema: Secondary | ICD-10-CM | POA: Insufficient documentation

## 2016-08-16 DIAGNOSIS — M25661 Stiffness of right knee, not elsewhere classified: Secondary | ICD-10-CM | POA: Insufficient documentation

## 2016-08-16 DIAGNOSIS — M25561 Pain in right knee: Secondary | ICD-10-CM | POA: Insufficient documentation

## 2016-08-16 NOTE — Therapy (Signed)
[facility] [address] [place], [place], [address] Phone: [phone]   Fax:  [phone]  Physical Therapy Evaluation  Patient Details  Name: [name] MRN: [id number] Date of Birth: 09-19-1950 Referring Provider: Doneen PoissonBlackman, Christopher, MD  Encounter Date: 08/16/2016      PT End of Session - 08/16/16 1052    Visit Number 1   Number of Visits 10   Date for PT Re-Evaluation 10/11/16   Authorization Type G-codes based on age   PT Start Time 1028  late   PT Stop Time 1113   PT Time Calculation (min) 45 min   Activity Tolerance Patient tolerated treatment well   Behavior During [facility] for tasks assessed/performed      Past Medical History:  Diagnosis Date  . Anemia    hx of  . Arthritis   . Depression   . Diabetes mellitus ORAL MEDS  . GERD (gastroesophageal reflux disease)    occasional  . History of kidney stones   . Hyperlipidemia   . Hypertension   . Hypothyroidism   . Knee pain, right   . 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 OSA on CPAP   . Osteoarthritis   . Pinched nerve in neck   . Pneumonia    hx of  . PONV (postoperative nausea and vomiting)    for 3-4 days after general anesthesia  . Swelling of knee joint, right   . Synovitis of knee RIGHT    Past Surgical History:  Procedure Laterality Date  . CESAREAN SECTION  X3  . KNEE ARTHROSCOPY  04/20/2011   Procedure: ARTHROSCOPY KNEE;  Surgeon: Loanne DrillingFrank V Aluisio, MD;  [facility];  Service: Orthopedics;  Laterality: Right;  WITH SYNOVECTOMY  . LEFT CARPAL TUNNEL / LEFT MIDDLE & RING FINGER TRIGGER RELEASE  08-26-2008  . LEFT SHOULDER ARTHROSCOPY W/ DEBRIDEMENT  09-09-2003  . LEFT SHOULDER ARTHROSCOPY/ LEFT THUMB TRIGGER RELEASE  02-22-2005  . PULLEY RELEASE LEFT LONG FINGER  07-14-2009  . RIGHT CARPAL TUNNEL/ RIGHT THUMB TRIGGER RELEASE'S  11-28-2006  . RIGHT SHOULDER ARTHROSCOPY W/ ROTATOR CUFF  REPAIR  01-13-2004  . SHOULDER ARTHROSCOPY DISTAL CLAVICLE EXCISION AND OPEN ROTATOR CUFF REPAIR  09-07-2004   LEFT  . SHOULDER ARTHROSCOPY W/ ACROMIAL REPAIR  11-29-2005   LEFT  . TONSILLECTOMY AND ADENOIDECTOMY  child  . TOTAL KNEE ARTHROPLASTY  12-14-2009   RIGHT  . TOTAL KNEE ARTHROPLASTY Left 11/05/2012   Procedure: LEFT TOTAL KNEE ARTHROPLASTY;  Surgeon: Loanne DrillingFrank V Aluisio, MD;  Location: [facility];  Service: Orthopedics;  Laterality: Left;  . TOTAL KNEE REVISION Right 07/29/2016   Procedure: RIGHT KNEE POLY-LINER EXCHANGE;  Surgeon: Kathryne HitchBlackman, Christopher Y, MD;  Location: [facility];  Service: Orthopedics;  Laterality: Right;  . TRIGGER FINGER RELEASE Right 02/21/2013   Procedure: RIGHT RING A-1 PULLEY RELEASE    (MINOR PROCEDURE) ;  Surgeon: Wyn Forsterobert V Sypher Jr., MD;  [facility];  Service: Orthopedics;  Laterality: Right;    There were no vitals filed for this visit.       Subjective Assessment - 08/16/16 1030    Subjective Pt presents to PT s/p Rt knee surgery to exchange the poly liner of her total knee replacement (12/2009) performed 07/29/16.  Pt was having pain in the Rt knee s/p her original surgery.     Pertinent History Rt TKA 12/2009, Lt TKA 2014   Limitations Standing;Walking   How  long can you stand comfortably? 5-10   How long can you walk comfortably? 5-10   Patient Stated Goals return to work, normalize gait, reduce pain, improve A/ROM and strength   Currently in Pain? Yes   Pain Score 7    Pain Location Knee   Pain Orientation Right   Pain Descriptors / Indicators Aching;Tightness   Pain Type Surgical pain   Pain Onset 1 to 4 weeks ago   Pain Frequency Constant   Aggravating Factors  standing and walking, bending it   Pain Relieving Factors ice, elevation, rest, pain medication            [facility] PT Assessment - 08/16/16 0001      Assessment   Medical Diagnosis history of total Rt knee replacement, poly liner exchange   Referring  Provider Doneen Poisson, MD   Onset Date/Surgical Date 07/29/16   Next MD Visit 09/08/16     Precautions   Precautions None     Restrictions   Weight Bearing Restrictions No     Balance Screen   Has the patient fallen in the past 6 months No   Has the patient had a decrease in activity level because of a fear of falling?  No   Is the patient reluctant to leave their home because of a fear of falling?  No     Home Tourist information centre manager residence   Living Arrangements Alone   Home Access Stairs to enter   Entrance Stairs-Number of Steps 4   Home Layout One level   Home Equipment [place] - single point     Prior Function   Level of Independence Independent   Vocation Full time employment   Vocation Requirements off work now (plans to return by 11/2016), admitting for procedures at [employer] none     Cognition   Overall Cognitive Status Within Functional Limits for tasks assessed     Observation/Other Assessments   Focus on Therapeutic Outcomes (FOTO)  55% limitation     Observation/Other Assessments-Edema    Edema Circumferential     Circumferential Edema   Circumferential - Right 19.34 inches   Circumferential - Left  18.5 inches     ROM / Strength   AROM / PROM / Strength AROM;PROM;Strength     AROM   Overall AROM  Deficits   AROM Assessment Site Knee   Right/Left Knee Right   Right Knee Extension 5   Right Knee Flexion 90     Strength   Overall Strength Deficits   Overall Strength Comments Lt hip and knee 4+/5   Strength Assessment Site Knee;Hip   Right/Left Hip Right   Right Hip Flexion 4/5   Right/Left Knee Right;Left   Right Knee Flexion 4+/5   Right Knee Extension 4/5   Left Knee Flexion 4+/5   Left Knee Extension 4+/5     Palpation   Patella mobility reduced patellar mobility on the Rt in all directions   Palpation comment surgical incision with scabbing present.  Reduced scar mobility.  Warmth and palpable  tenderness over medial and lateral joint line.       Transfers   Transfers Sit to Stand;Stand to Sit   Sit to Stand With upper extremity assist   Stand to Sit With upper extremity assist     Ambulation/Gait   Ambulation/Gait Yes   Assistive device Straight cane   Gait Pattern Step-through pattern;Decreased stance time - right;Decreased hip/knee flexion - right  Objective measurements completed on examination: See above findings.          [facility] Adult PT Treatment/Exercise - 08/16/16 0001      Exercises   Exercises Knee/Hip     Knee/Hip Exercises: Aerobic   Nustep Level 1 x 8 minutes     Knee/Hip Exercises: Seated   Long Arc Quad Strengthening;Right;10 reps     Modalities   Modalities Cryotherapy;Vasopneumatic     Vasopneumatic   Number Minutes Vasopneumatic  15 minutes   Vasopnuematic Location  Knee   Vasopneumatic Pressure Medium   Vasopneumatic Temperature  3 snowflakes                  PT Short Term Goals - 08/16/16 1059      PT SHORT TERM GOAL #1   Title be independent in initial HEP   Time 4   Period Weeks   Status New     PT SHORT TERM GOAL #2   Title demonstrate Rt knee A/ROM flexion to > or = to 105 degrees to improve squatting and sit to stand transition   Time 4   Period Weeks   Status New     PT SHORT TERM GOAL #3   Title demonstrate symmetry with ambulation on level surface   Time 4   Period Weeks   Status New     PT SHORT TERM GOAL #4   Title report < or = to 4/10 Rt knee pain with standing and walking   Time 4   Period Weeks   Status New     PT SHORT TERM GOAL #5   Title improve endurance to stand and walk for > or = to 15-20 minutes to improve community function   Time 4   Period Weeks   Status New           PT Long Term Goals - 08/16/16 1027      PT LONG TERM GOAL #1   Title be independent in advanced HEP   Time 8   Period Weeks   Status New     PT LONG TERM GOAL #2   Title reduce FOTO to  < or = to 44% limitation   Time 8   Period Weeks   Status New     PT LONG TERM GOAL #3   Title The patient will be able to walk/stand for 35-45 min needed for work duties and grocery shopping   Time 8   Period Weeks   Status New     PT LONG TERM GOAL #4   Title report a 75% reduction in Rt knee pain with standing and walking   Time 8   Period Weeks   Status New     PT LONG TERM GOAL #5   Title demonstrate Rt knee A/ROM flexion to 115 degrees to improve squatting and descending steps   Time 8   Period Weeks   Status New     Additional Long Term Goals   Additional Long Term Goals Yes     PT LONG TERM GOAL #6   Title improve Rt knee strength to ascend and descend steps with step-over-step gait with use of 1 rail   Time 8   Period Weeks   Status New                Plan - 08/16/16 1105    Clinical Impression Statement Pt presents to PT s/p Rt knee surgery for replacement of polyliner in  Rt knee replacement.  Original replacement was in 2011 and pt was having continued pain over the years.  Surgery was perfromed 07/29/16.  Pt with healing surgical incision, limited Rt knee A/ROM, Rt LE strength, gait dysfunction and limited tolerance for standing due to pain and endurance deficits.  Pt will benefit from skilled PT for Rt knee edema management, scar mobilization, strength, A/ROM, gait and endurance progression.     History and Personal Factors relevant to plan of care: Rt and Lt total knee replacements, revision of Rt knee replacement, hypertension, widespread OA and chronic pain   Clinical Presentation Evolving   Clinical Presentation due to: s/p surgery 5/18 with 7/10 pain, limited mobility and varying levels of pain and tolerance for activity   Clinical Decision Making Moderate   Rehab Potential Good   PT Frequency 2x / week   PT Duration 8 weeks   PT Treatment/Interventions ADLs/Self Care Home Management;Cryotherapy;Electrical Stimulation;Functional mobility  training;Stair training;Gait training;Therapeutic activities;Therapeutic exercise;Neuromuscular re-education;Patient/family education;Passive range of motion;Scar mobilization;Manual techniques;Taping;Vasopneumatic Device   PT Next Visit Plan Rt knee A/ROM, edema management, strength, proprioception, gait   Consulted and Agree with Plan of Care Patient      Patient will benefit from skilled therapeutic intervention in order to improve the following deficits and impairments:  Abnormal gait, Decreased activity tolerance, Decreased strength, Increased edema, Impaired flexibility, Pain, Decreased endurance, Decreased range of motion, Difficulty walking  Visit Diagnosis: Muscle weakness (generalized) - Plan: PT plan of care cert/re-cert  Difficulty in walking, not elsewhere classified - Plan: PT plan of care cert/re-cert  Acute pain of right knee - Plan: PT plan of care cert/re-cert  Localized edema - Plan: PT plan of care cert/re-cert  Stiffness of right knee, not elsewhere classified - Plan: PT plan of care cert/re-cert      G-Codes - Sep 10, 2016 1027    Functional Assessment Tool Used (Outpatient Only) FOTO: 55% limitation   Functional Limitation Mobility: Walking and moving around   Mobility: Walking and Moving Around Current Status ([id number]) At least 40 percent but less than 60 percent impaired, limited or restricted   Mobility: Walking and Moving Around Goal Status [phone]) At least 40 percent but less than 60 percent impaired, limited or restricted       Problem List Patient Active Problem List   Diagnosis Date Noted  . 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 Diabetes (HCC) 01/08/2014  . Essential hypertension 01/08/2014  . Hyperlipidemia 01/08/2014  . Postoperative anemia due to acute blood loss 11/22/2012  . Hyponatremia  11/06/2012  . OA (osteoarthritis) of knee 11/05/2012  . Villonodular synovitis of knee 04/20/2011   Lorrene Reid, PT 2016/09/10 11:17 AM  [facility] [address], [address] [place], [place], [address] Phone: [phone]   Fax:  [phone]  Name: [name] MRN: [id number] Date of Birth: 1950-10-10

## 2016-08-17 ENCOUNTER — Ambulatory Visit: Payer: 59

## 2016-08-17 DIAGNOSIS — M25661 Stiffness of right knee, not elsewhere classified: Secondary | ICD-10-CM | POA: Diagnosis not present

## 2016-08-17 DIAGNOSIS — R262 Difficulty in walking, not elsewhere classified: Secondary | ICD-10-CM | POA: Diagnosis not present

## 2016-08-17 DIAGNOSIS — M6281 Muscle weakness (generalized): Secondary | ICD-10-CM

## 2016-08-17 DIAGNOSIS — M25561 Pain in right knee: Secondary | ICD-10-CM

## 2016-08-17 DIAGNOSIS — R600 Localized edema: Secondary | ICD-10-CM

## 2016-08-17 NOTE — Therapy (Signed)
[facility] [address], [address] [place], [place], [address] Phone: [phone]   Fax:  [phone]  Physical Therapy Treatment  Patient Details  Name: [name] MRN: [id number] Date of Birth: [birth year] Referring Provider: Doneen Poisson, MD  Encounter Date: 08/17/2016      PT End of Session - 08/17/16 1602    Visit Number 2   Number of Visits 10   Date for PT Re-Evaluation 11/09/2016   Authorization Type G-codes based on age   PT Start Time 1528   PT Stop Time 1617   PT Time Calculation (min) 49 min   Activity Tolerance Patient limited by pain   Behavior During Therapy [facility] for tasks assessed/performed      Past Medical History:  Diagnosis Date  . Anemia    hx of  . Arthritis   . Depression   . Diabetes mellitus [facility]  . GERD (gastroesophageal reflux disease)    occasional  . History of kidney stones   . Hyperlipidemia   . Hypertension   . Hypothyroidism   . Knee pain, right   . 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 OSA on CPAP   . Osteoarthritis   . Pinched nerve in neck   . Pneumonia    hx of  . PONV (postoperative nausea and vomiting)    for 3-4 days after general anesthesia  . Swelling of knee joint, right   . Synovitis of knee RIGHT    Past Surgical History:  Procedure Laterality Date  . CESAREAN SECTION  X3  . KNEE ARTHROSCOPY  04/20/2011   Procedure: ARTHROSCOPY KNEE;  Surgeon: Loanne Drilling, MD;  Location: [facility];  Service: Orthopedics;  Laterality: Right;  WITH SYNOVECTOMY  . LEFT CARPAL TUNNEL / LEFT MIDDLE & RING FINGER TRIGGER RELEASE  08-26-2008  . LEFT SHOULDER ARTHROSCOPY W/ DEBRIDEMENT  09-09-2003  . LEFT SHOULDER ARTHROSCOPY/ LEFT THUMB TRIGGER RELEASE  02-22-2005  . PULLEY RELEASE LEFT LONG FINGER  07-14-2009  . RIGHT CARPAL TUNNEL/ RIGHT THUMB TRIGGER RELEASE'S  11-28-2006  . RIGHT SHOULDER ARTHROSCOPY W/ ROTATOR CUFF REPAIR   01-13-2004  . SHOULDER ARTHROSCOPY DISTAL CLAVICLE EXCISION AND OPEN ROTATOR CUFF REPAIR  09-07-2004   LEFT  . SHOULDER ARTHROSCOPY W/ ACROMIAL REPAIR  11-29-2005   LEFT  . TONSILLECTOMY AND ADENOIDECTOMY  child  . TOTAL KNEE ARTHROPLASTY  12-14-2009   RIGHT  . TOTAL KNEE ARTHROPLASTY Left 11/05/2012   Procedure: LEFT TOTAL KNEE ARTHROPLASTY;  Surgeon: Loanne Drilling, MD;  Location: [facility];  Service: Orthopedics;  Laterality: Left;  . TOTAL KNEE REVISION Right 07/29/2016   Procedure: RIGHT KNEE POLY-LINER EXCHANGE;  Surgeon: Kathryne Hitch, MD;  Location: [facility];  Service: Orthopedics;  Laterality: Right;  . TRIGGER FINGER RELEASE Right 02/21/2013   Procedure: RIGHT RING A-1 PULLEY RELEASE    (MINOR PROCEDURE) ;  Surgeon: Wyn Forster., MD;  Location: [facility];  Service: Orthopedics;  Laterality: Right;    There were no vitals filed for this visit.      Subjective Assessment - 08/17/16 1533    Subjective Pt reports that she didn't sleep well last night.  Increased Rt knee pain due to walking for errands this afternoon.     Pertinent History Rt TKA 12/2009, Lt TKA 2014   Currently in Pain? Yes   Pain Score 7    Pain Orientation Right  [facility] Adult PT Treatment/Exercise - 08/17/16 0001      Knee/Hip Exercises: Aerobic   Stationary Bike Level 0 for A/ROM x 8 minutes     Knee/Hip Exercises: Standing   Rocker Board 2 minutes   Rebounder 3 directions x 1 minute each     Knee/Hip Exercises: Seated   Long Arc Quad Strengthening;Right;10 reps;2 sets     Knee/Hip Exercises: Supine   Quad Sets Strengthening;Right;2 sets;10 reps     Vasopneumatic   Number Minutes Vasopneumatic  15 minutes   Vasopnuematic Location  Knee   Vasopneumatic Pressure Medium   Vasopneumatic Temperature  3 snowflakes     Manual Therapy   Manual Therapy Edema management;Soft tissue mobilization;Myofascial release   Manual therapy  comments gentle mobilization over bil distal quads/hamstrings and proximal gastroc   tender over medial aspect of knee at distal quads                  PT Short Term Goals - 08/16/16 1059      PT SHORT TERM GOAL #1   Title be independent in initial HEP   Time 4   Period Weeks   Status New     PT SHORT TERM GOAL #2   Title demonstrate Rt knee A/ROM flexion to > or = to 105 degrees to improve squatting and sit to stand transition   Time 4   Period Weeks   Status New     PT SHORT TERM GOAL #3   Title demonstrate symmetry with ambulation on level surface   Time 4   Period Weeks   Status New     PT SHORT TERM GOAL #4   Title report < or = to 4/10 Rt knee pain with standing and walking   Time 4   Period Weeks   Status New     PT SHORT TERM GOAL #5   Title improve endurance to stand and walk for > or = to 15-20 minutes to improve community function   Time 4   Period Weeks   Status New           PT Long Term Goals - 08/16/16 1027      PT LONG TERM GOAL #1   Title be independent in advanced HEP   Time 8   Period Weeks   Status New     PT LONG TERM GOAL #2   Title reduce FOTO to < or = to 44% limitation   Time 8   Period Weeks   Status New     PT LONG TERM GOAL #3   Title The patient will be able to walk/stand for 35-45 min needed for work duties and grocery shopping   Time 8   Period Weeks   Status New     PT LONG TERM GOAL #4   Title report a 75% reduction in Rt knee pain with standing and walking   Time 8   Period Weeks   Status New     PT LONG TERM GOAL #5   Title demonstrate Rt knee A/ROM flexion to 115 degrees to improve squatting and descending steps   Time 8   Period Weeks   Status New     Additional Long Term Goals   Additional Long Term Goals Yes     PT LONG TERM GOAL #6   Title improve Rt knee strength to ascend and descend steps with step-over-step gait with use of 1 rail   Time 8  Period Weeks   Status New                Plan - 08/17/16 1535    Clinical Impression Statement Pt with only 1 session after evaluation yesterday.  Pt with increased Rt knee pain after running errands today.  Pt with pain, edema and stiffness s/p surgery.  Pt will continue to benefit from skilled PT for Rt knee edema management, scar mobilization, strength, A/ROM, gait and endurance.     PT Frequency 2x / week   PT Duration 8 weeks   PT Treatment/Interventions ADLs/Self Care Home Management;Cryotherapy;Electrical Stimulation;Functional mobility training;Stair training;Gait training;Therapeutic activities;Therapeutic exercise;Neuromuscular re-education;Patient/family education;Passive range of motion;Scar mobilization;Manual techniques;Taping;Vasopneumatic Device   PT Next Visit Plan Rt knee A/ROM, edema management, strength, proprioception, gait   Consulted and Agree with Plan of Care Patient      Patient will benefit from skilled therapeutic intervention in order to improve the following deficits and impairments:  Abnormal gait, Decreased activity tolerance, Decreased strength, Increased edema, Impaired flexibility, Pain, Decreased endurance, Decreased range of motion, Difficulty walking  Visit Diagnosis: Muscle weakness (generalized)  Difficulty in walking, not elsewhere classified  Acute pain of right knee  Localized edema  Stiffness of right knee, not elsewhere classified       G-Codes - 09-10-2016 1027    Functional Assessment Tool Used (Outpatient Only) FOTO: 55% limitation   Functional Limitation Mobility: Walking and moving around   Mobility: Walking and Moving Around Current Status [phone]) At least 40 percent but less than 60 percent impaired, limited or restricted   Mobility: Walking and Moving Around Goal Status [phone]) At least 40 percent but less than 60 percent impaired, limited or restricted      Problem List Patient Active Problem List   Diagnosis Date Noted  . Polyethylene liner wear  following total knee arthroplasty requiring isolated polyethylene liner exchange (HCC) 07/29/2016  . 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 Diabetes (HCC) 01/08/2014  . Essential hypertension 01/08/2014  . Hyperlipidemia 01/08/2014  . Postoperative anemia due to acute blood loss 11/22/2012  . Hyponatremia 11/06/2012  . OA (osteoarthritis) of knee 11/05/2012  . Villonodular synovitis of knee 04/20/2011    Lorrene Reid, PT 08/17/16 4:06 PM  [facility] [address] [place], [place], [address] Phone: [phone]   Fax:  [phone]  Name: [name] MRN: [id number] Date of Birth: Apr 24, 1950

## 2016-08-22 ENCOUNTER — Ambulatory Visit: Payer: 59

## 2016-08-22 DIAGNOSIS — M6281 Muscle weakness (generalized): Secondary | ICD-10-CM

## 2016-08-22 DIAGNOSIS — M25561 Pain in right knee: Secondary | ICD-10-CM | POA: Diagnosis not present

## 2016-08-22 DIAGNOSIS — R600 Localized edema: Secondary | ICD-10-CM | POA: Diagnosis not present

## 2016-08-22 DIAGNOSIS — M25661 Stiffness of right knee, not elsewhere classified: Secondary | ICD-10-CM | POA: Diagnosis not present

## 2016-08-22 DIAGNOSIS — R262 Difficulty in walking, not elsewhere classified: Secondary | ICD-10-CM | POA: Diagnosis not present

## 2016-08-22 NOTE — Therapy (Signed)
[facility] [address], [address] [place], [place], [address] Phone: [phone]   Fax:  [phone]  Physical Therapy Treatment  Patient Details  Name: [name] MRN: [id number] Date of Birth: 03/30/1950 Referring Provider: Doneen Poisson, MD  Encounter Date: 08/22/2016      PT End of Session - 08/22/16 0925    Visit Number 3   Number of Visits 10   Date for PT Re-Evaluation 10-14-2016   Authorization Type G-codes based on age   PT Start Time 0846   PT Stop Time 0942   PT Time Calculation (min) 56 min   Activity Tolerance Patient tolerated treatment well   Behavior During Therapy [facility] for tasks assessed/performed      Past Medical History:  Diagnosis Date  . Anemia    hx of  . Arthritis   . Depression   . Diabetes mellitus [facility]  . GERD (gastroesophageal reflux disease)    occasional  . History of kidney stones   . Hyperlipidemia   . Hypertension   . Hypothyroidism   . Knee pain, right   . 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 OSA on CPAP   . Osteoarthritis   . Pinched nerve in neck   . Pneumonia    hx of  . PONV (postoperative nausea and vomiting)    for 3-4 days after general anesthesia  . Swelling of knee joint, right   . Synovitis of knee RIGHT    Past Surgical History:  Procedure Laterality Date  . CESAREAN SECTION  X3  . KNEE ARTHROSCOPY  04/20/2011   Procedure: ARTHROSCOPY KNEE;  Surgeon: Loanne Drilling, MD;  Location: [facility];  Service: Orthopedics;  Laterality: Right;  WITH SYNOVECTOMY  . LEFT CARPAL TUNNEL / LEFT MIDDLE & RING FINGER TRIGGER RELEASE  08-26-2008  . LEFT SHOULDER ARTHROSCOPY W/ DEBRIDEMENT  09-09-2003  . LEFT SHOULDER ARTHROSCOPY/ LEFT THUMB TRIGGER RELEASE  02-22-2005  . PULLEY RELEASE LEFT LONG FINGER  07-14-2009  . RIGHT CARPAL TUNNEL/ RIGHT THUMB TRIGGER RELEASE'S  11-28-2006  . RIGHT SHOULDER ARTHROSCOPY W/ ROTATOR CUFF REPAIR   01-13-2004  . SHOULDER ARTHROSCOPY DISTAL CLAVICLE EXCISION AND OPEN ROTATOR CUFF REPAIR  09-07-2004   LEFT  . SHOULDER ARTHROSCOPY W/ ACROMIAL REPAIR  11-29-2005   LEFT  . TONSILLECTOMY AND ADENOIDECTOMY  child  . TOTAL KNEE ARTHROPLASTY  12-14-2009   RIGHT  . TOTAL KNEE ARTHROPLASTY Left 11/05/2012   Procedure: LEFT TOTAL KNEE ARTHROPLASTY;  Surgeon: Loanne Drilling, MD;  Location: [facility];  Service: Orthopedics;  Laterality: Left;  . TOTAL KNEE REVISION Right 07/29/2016   Procedure: RIGHT KNEE POLY-LINER EXCHANGE;  Surgeon: Kathryne Hitch, MD;  Location: [facility];  Service: Orthopedics;  Laterality: Right;  . TRIGGER FINGER RELEASE Right 02/21/2013   Procedure: RIGHT RING A-1 PULLEY RELEASE    (MINOR PROCEDURE) ;  Surgeon: Wyn Forster., MD;  Location: [facility];  Service: Orthopedics;  Laterality: Right;    There were no vitals filed for this visit.      Subjective Assessment - 08/22/16 0851    Subjective My knee is stiff and sore.   Pertinent History Rt TKA 12/2009, Lt TKA 2014   Currently in Pain? Yes   Pain Score 3    Pain Location Knee   Pain Orientation Right   Pain Descriptors / Indicators Aching;Tightness   Pain Type Surgical pain   Pain Onset 1  to 4 weeks ago   Pain Frequency Constant   Aggravating Factors  standing and walking, bending knee   Pain Relieving Factors ice, elevation, rest, pain medication            [facility] PT Assessment - 08/22/16 0001      AROM   Right Knee Flexion 100                     [facility] Adult PT Treatment/Exercise - 08/22/16 0001      Knee/Hip Exercises: Aerobic   Nustep Level 1 x 10 minutes     Knee/Hip Exercises: Standing   Rocker Board 2 minutes   Rebounder 3 directions x 1 minute each     Knee/Hip Exercises: Seated   Long Arc Quad Strengthening;Right;10 reps;2 sets     Knee/Hip Exercises: Supine   Quad Sets Strengthening;Right;2 sets;10 reps     Vasopneumatic   Number Minutes  Vasopneumatic  15 minutes   Vasopnuematic Location  Knee   Vasopneumatic Pressure Medium   Vasopneumatic Temperature  3 snowflakes     Manual Therapy   Manual Therapy Edema management;Soft tissue mobilization;Myofascial release   Manual therapy comments gentle mobilization over bil distal quads/hamstrings and proximal gastroc   tender over medial aspect of knee at distal quads                  PT Short Term Goals - 08/22/16 0854      PT SHORT TERM GOAL #1   Title be independent in initial HEP   Time 4   Period Weeks   Status On-going     PT SHORT TERM GOAL #2   Title demonstrate Rt knee A/ROM flexion to > or = to 105 degrees to improve squatting and sit to stand transition   Time 4   Period Weeks   Status On-going     PT SHORT TERM GOAL #3   Title demonstrate symmetry with ambulation on level surface   Period Weeks   Status On-going           PT Long Term Goals - 08/16/16 1027      PT LONG TERM GOAL #1   Title be independent in advanced HEP   Time 8   Period Weeks   Status New     PT LONG TERM GOAL #2   Title reduce FOTO to < or = to 44% limitation   Time 8   Period Weeks   Status New     PT LONG TERM GOAL #3   Title The patient will be able to walk/stand for 35-45 min needed for work duties and grocery shopping   Time 8   Period Weeks   Status New     PT LONG TERM GOAL #4   Title report a 75% reduction in Rt knee pain with standing and walking   Time 8   Period Weeks   Status New     PT LONG TERM GOAL #5   Title demonstrate Rt knee A/ROM flexion to 115 degrees to improve squatting and descending steps   Time 8   Period Weeks   Status New     Additional Long Term Goals   Additional Long Term Goals Yes     PT LONG TERM GOAL #6   Title improve Rt knee strength to ascend and descend steps with step-over-step gait with use of 1 rail   Time 8   Period Weeks   Status New  Plan - 08/22/16 0857    Clinical  Impression Statement Pt with continued Rt knee stiffness, edema and functional weakness s/p surgery.  Pt with improved symmetry with gait on level surface with ability to heel strike on the Rt and still with reduced time spent in stance on the Rt.  Pt with tenderness/pain at Rt distal hamstring insertion.  Pt will continue to benefit from skilled PT for strength, flexibility, gait and edema management.     Rehab Potential Good   PT Frequency 2x / week   PT Duration 8 weeks   PT Treatment/Interventions ADLs/Self Care Home Management;Cryotherapy;Electrical Stimulation;Functional mobility training;Stair training;Gait training;Therapeutic activities;Therapeutic exercise;Neuromuscular re-education;Patient/family education;Passive range of motion;Scar mobilization;Manual techniques;Taping;Vasopneumatic Device   PT Next Visit Plan Rt knee A/ROM, edema management, strength, proprioception, gait   Consulted and Agree with Plan of Care Patient      Patient will benefit from skilled therapeutic intervention in order to improve the following deficits and impairments:  Abnormal gait, Decreased activity tolerance, Decreased strength, Increased edema, Impaired flexibility, Pain, Decreased endurance, Decreased range of motion, Difficulty walking  Visit Diagnosis: Muscle weakness (generalized)  Difficulty in walking, not elsewhere classified  Acute pain of right knee  Localized edema  Stiffness of right knee, not elsewhere classified     Problem List Patient Active Problem List   Diagnosis Date Noted  . 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 Diabetes (HCC) 01/08/2014  . Essential hypertension 01/08/2014  . Hyperlipidemia 01/08/2014  . Postoperative anemia due to acute blood loss 11/22/2012  . Hyponatremia 11/06/2012  . OA (osteoarthritis) of  knee 11/05/2012  . Villonodular synovitis of knee 04/20/2011    Lorrene Reid, PT 08/22/16 9:27 AM  [facility] [address] [place], [place], [address] Phone: [phone]   Fax:  [phone]  Name: [name] MRN: [id number] Date of Birth: [birth year]

## 2016-08-24 ENCOUNTER — Ambulatory Visit: Payer: 59 | Admitting: Physical Therapy

## 2016-08-24 ENCOUNTER — Encounter: Payer: Self-pay | Admitting: Physical Therapy

## 2016-08-24 DIAGNOSIS — R262 Difficulty in walking, not elsewhere classified: Secondary | ICD-10-CM

## 2016-08-24 DIAGNOSIS — R600 Localized edema: Secondary | ICD-10-CM

## 2016-08-24 DIAGNOSIS — M25561 Pain in right knee: Secondary | ICD-10-CM | POA: Diagnosis not present

## 2016-08-24 DIAGNOSIS — M6281 Muscle weakness (generalized): Secondary | ICD-10-CM | POA: Diagnosis not present

## 2016-08-24 DIAGNOSIS — M25661 Stiffness of right knee, not elsewhere classified: Secondary | ICD-10-CM

## 2016-08-24 NOTE — Therapy (Signed)
[facility] [address], [address] [place], [place], [address] Phone: [phone]   Fax:  [phone]  Physical Therapy Treatment  Patient Details  Name: [name] MRN: [id number] Date of Birth: [birth year] Referring Provider: Doneen PoissonBlackman, Christopher, MD  Encounter Date: 08/24/2016      PT End of Session - 08/24/16 1055    Visit Number 4   Number of Visits 10   Date for PT Re-Evaluation 10/11/16   Authorization Type G-codes based on age   PT Start Time 1016   PT Stop Time 1110   PT Time Calculation (min) 54 min   Activity Tolerance Patient tolerated treatment well   Behavior During Therapy [facility] for tasks assessed/performed      Past Medical History:  Diagnosis Date  . Anemia    hx of  . Arthritis   . Depression   . Diabetes mellitus [facility]  . GERD (gastroesophageal reflux disease)    occasional  . History of kidney stones   . Hyperlipidemia   . Hypertension   . Hypothyroidism   . Knee pain, right   . 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 OSA on CPAP   . Osteoarthritis   . Pinched nerve in neck   . Pneumonia    hx of  . PONV (postoperative nausea and vomiting)    for 3-4 days after general anesthesia  . Swelling of knee joint, right   . Synovitis of knee RIGHT    Past Surgical History:  Procedure Laterality Date  . CESAREAN SECTION  X3  . KNEE ARTHROSCOPY  04/20/2011   Procedure: ARTHROSCOPY KNEE;  Surgeon: Loanne DrillingFrank V Aluisio, MD;  [facility] ;  Service: Orthopedics;  Laterality: Right;  WITH SYNOVECTOMY  . LEFT CARPAL TUNNEL / LEFT MIDDLE & RING FINGER TRIGGER RELEASE  08-26-2008  . LEFT SHOULDER ARTHROSCOPY W/ DEBRIDEMENT  09-09-2003  . LEFT SHOULDER ARTHROSCOPY/ LEFT THUMB TRIGGER RELEASE  02-22-2005  . PULLEY RELEASE LEFT LONG FINGER  07-14-2009  . RIGHT CARPAL TUNNEL/ RIGHT THUMB TRIGGER RELEASE'S  11-28-2006  . RIGHT SHOULDER ARTHROSCOPY W/ ROTATOR CUFF REPAIR   01-13-2004  . SHOULDER ARTHROSCOPY DISTAL CLAVICLE EXCISION AND OPEN ROTATOR CUFF REPAIR  09-07-2004   LEFT  . SHOULDER ARTHROSCOPY W/ ACROMIAL REPAIR  11-29-2005   LEFT  . TONSILLECTOMY AND ADENOIDECTOMY  child  . TOTAL KNEE ARTHROPLASTY  12-14-2009   RIGHT  . TOTAL KNEE ARTHROPLASTY Left 11/05/2012   Procedure: LEFT TOTAL KNEE ARTHROPLASTY;  Surgeon: Loanne DrillingFrank V Aluisio, MD;  Location: [facility];  Service: Orthopedics;  Laterality: Left;  . TOTAL KNEE REVISION Right 07/29/2016   Procedure: RIGHT KNEE POLY-LINER EXCHANGE;  Surgeon: Kathryne HitchBlackman, Christopher Y, MD;  Location: [facility];  Service: Orthopedics;  Laterality: Right;  . TRIGGER FINGER RELEASE Right 02/21/2013   Procedure: RIGHT RING A-1 PULLEY RELEASE    (MINOR PROCEDURE) ;  Surgeon: Wyn Forsterobert V Sypher Jr., MD;  [facility];  Service: Orthopedics;  Laterality: Right;    There were no vitals filed for this visit.      Subjective Assessment - 08/24/16 1020    Subjective my knee feels about the same. I was sore yesterday after being on my feet grocery shopping   Pertinent History Rt TKA 12/2009, Lt TKA 2014   Limitations Standing;Walking   Patient Stated Goals return to work, normalize gait, reduce pain, improve A/ROM and strength   Currently in Pain? Yes   Pain  Score 4    Pain Location Knee   Pain Orientation Right   Pain Descriptors / Indicators Aching;Tightness   Pain Type Surgical pain   Pain Onset 1 to 4 weeks ago                         [facility] Adult PT Treatment/Exercise - 08/24/16 0001      Knee/Hip Exercises: Aerobic   Stationary Bike level 0 for A/ROM x 6 minutes     Knee/Hip Exercises: Standing   Forward Step Up Right;1 set;10 reps;Hand Hold: 1;Step Height: 4"   Step Down Right;1 set;15 reps;Hand Hold: 1;Step Height: 2"   Rocker Board 2 minutes   Rebounder 3 directions x 1 minute     Knee/Hip Exercises: Seated   Long Arc Quad Strengthening;Right;2 sets;10 reps     Knee/Hip  Exercises: Supine   Quad Sets Strengthening;Right;2 sets;10 reps     Vasopneumatic   Number Minutes Vasopneumatic  15 minutes   Vasopnuematic Location  Knee   Vasopneumatic Pressure Medium   Vasopneumatic Temperature  3 flakes     Manual Therapy   Manual Therapy Edema management;Soft tissue mobilization   Manual therapy comments gentle mobilization over distal quads and hamstrings and proximal gastroc                  PT Short Term Goals - 08/24/16 1057      PT SHORT TERM GOAL #1   Title be independent in initial HEP   Status On-going     PT SHORT TERM GOAL #2   Title demonstrate Rt knee A/ROM flexion to > or = to 105 degrees to improve squatting and sit to stand transition   Status On-going     PT SHORT TERM GOAL #3   Title demonstrate symmetry with ambulation on level surface   Status On-going     PT SHORT TERM GOAL #4   Title report < or = to 4/10 Rt knee pain with standing and walking   Status On-going     PT SHORT TERM GOAL #5   Title improve endurance to stand and walk for > or = to 15-20 minutes to improve community function   Status On-going           PT Long Term Goals - 08/24/16 1058      PT LONG TERM GOAL #1   Title be independent in advanced HEP   Status On-going     PT LONG TERM GOAL #2   Title reduce FOTO to < or = to 44% limitation   Status On-going     PT LONG TERM GOAL #3   Title The patient will be able to walk/stand for 35-45 min needed for work duties and grocery shopping   Status On-going     PT LONG TERM GOAL #4   Title report a 75% reduction in Rt knee pain with standing and walking   Status On-going     PT LONG TERM GOAL #5   Title demonstrate Rt knee A/ROM flexion to 115 degrees to improve squatting and descending steps   Status On-going     PT LONG TERM GOAL #6   Title improve Rt knee strength to ascend and descend steps with step-over-step gait with use of 1 rail   Status On-going               Plan -  08/24/16 1055    Clinical Impression Statement Pt  continues with Rt knee stiffenss, edema and weakness and pain especially with stair negotiation.  Pt will continue to benefit from skilled PT to address deficits and improve functional mobility   Rehab Potential Good   PT Frequency 2x / week   PT Duration 8 weeks   PT Treatment/Interventions ADLs/Self Care Home Management;Cryotherapy;Electrical Stimulation;Functional mobility training;Stair training;Gait training;Therapeutic activities;Therapeutic exercise;Neuromuscular re-education;Patient/family education;Passive range of motion;Scar mobilization;Manual techniques;Taping;Vasopneumatic Device   PT Next Visit Plan manual and modalities for AROM and edema management, strength, proprioception   Consulted and Agree with Plan of Care Patient      Patient will benefit from skilled therapeutic intervention in order to improve the following deficits and impairments:  Abnormal gait, Decreased activity tolerance, Decreased strength, Increased edema, Impaired flexibility, Pain, Decreased endurance, Decreased range of motion, Difficulty walking  Visit Diagnosis: Muscle weakness (generalized)  Difficulty in walking, not elsewhere classified  Acute pain of right knee  Localized edema  Stiffness of right knee, not elsewhere classified     Problem List Patient Active Problem List   Diagnosis Date Noted  . 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 Diabetes (HCC) 01/08/2014  . Essential hypertension 01/08/2014  . Hyperlipidemia 01/08/2014  . Postoperative anemia due to acute blood loss 11/22/2012  . Hyponatremia 11/06/2012  . OA (osteoarthritis) of knee 11/05/2012  . Villonodular synovitis of knee 04/20/2011    Reggy Eye, PT, DPT 08/24/2016, 10:59 AM  [facility] [address] [place], [place], [address] Phone: [phone]   Fax:  [phone]  Name: [name] MRN: [id number] Date of Birth: 1950/10/14

## 2016-08-25 MED FILL — DULoxetine HCL 60 MG CPEP: 60 | 30 days supply | Qty: 30 | Fill #3

## 2016-08-29 ENCOUNTER — Ambulatory Visit: Payer: 59 | Admitting: Physical Therapy

## 2016-08-29 ENCOUNTER — Encounter: Payer: Self-pay | Admitting: Physical Therapy

## 2016-08-29 DIAGNOSIS — R262 Difficulty in walking, not elsewhere classified: Secondary | ICD-10-CM

## 2016-08-29 DIAGNOSIS — M25661 Stiffness of right knee, not elsewhere classified: Secondary | ICD-10-CM | POA: Diagnosis not present

## 2016-08-29 DIAGNOSIS — M25561 Pain in right knee: Secondary | ICD-10-CM

## 2016-08-29 DIAGNOSIS — M6281 Muscle weakness (generalized): Secondary | ICD-10-CM | POA: Diagnosis not present

## 2016-08-29 DIAGNOSIS — R600 Localized edema: Secondary | ICD-10-CM

## 2016-08-29 NOTE — Therapy (Signed)
[facility] [address] [place], [place], [address] Phone: [phone]   Fax:  [phone]  Physical Therapy Treatment  Patient Details  Name: [name] MRN: [id number] Date of Birth: September 18, 1950 Referring Provider: Doneen Poisson, MD  Encounter Date: 08/29/2016      PT End of Session - 08/29/16 1104    Visit Number 5   Number of Visits 10   Date for PT Re-Evaluation 06-Nov-2016   Authorization Type G-codes based on age   PT Start Time 1100   PT Stop Time 1200   PT Time Calculation (min) 60 min   Activity Tolerance Patient tolerated treatment well   Behavior During Therapy [facility] for tasks assessed/performed      Past Medical History:  Diagnosis Date  . Anemia    hx of  . Arthritis   . Depression   . Diabetes mellitus [facility]  . GERD (gastroesophageal reflux disease)    occasional  . History of kidney stones   . Hyperlipidemia   . Hypertension   . Hypothyroidism   . Knee pain, right   . 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 OSA on CPAP   . Osteoarthritis   . Pinched nerve in neck   . Pneumonia    hx of  . PONV (postoperative nausea and vomiting)    for 3-4 days after general anesthesia  . Swelling of knee joint, right   . Synovitis of knee RIGHT    Past Surgical History:  Procedure Laterality Date  . CESAREAN SECTION  X3  . KNEE ARTHROSCOPY  04/20/2011   Procedure: ARTHROSCOPY KNEE;  Surgeon: Loanne Drilling, MD;  Location: [facility];  Service: Orthopedics;  Laterality: Right;  WITH SYNOVECTOMY  . LEFT CARPAL TUNNEL / LEFT MIDDLE & RING FINGER TRIGGER RELEASE  08-26-2008  . LEFT SHOULDER ARTHROSCOPY W/ DEBRIDEMENT  09-09-2003  . LEFT SHOULDER ARTHROSCOPY/ LEFT THUMB TRIGGER RELEASE  02-22-2005  . PULLEY RELEASE LEFT LONG FINGER  07-14-2009  . RIGHT CARPAL TUNNEL/ RIGHT THUMB TRIGGER RELEASE'S  11-28-2006  . RIGHT SHOULDER ARTHROSCOPY W/ ROTATOR CUFF REPAIR   01-13-2004  . SHOULDER ARTHROSCOPY DISTAL CLAVICLE EXCISION AND OPEN ROTATOR CUFF REPAIR  09-07-2004   LEFT  . SHOULDER ARTHROSCOPY W/ ACROMIAL REPAIR  11-29-2005   LEFT  . TONSILLECTOMY AND ADENOIDECTOMY  child  . TOTAL KNEE ARTHROPLASTY  12-14-2009   RIGHT  . TOTAL KNEE ARTHROPLASTY Left 11/05/2012   Procedure: LEFT TOTAL KNEE ARTHROPLASTY;  Surgeon: Loanne Drilling, MD;  Location: [facility];  Service: Orthopedics;  Laterality: Left;  . TOTAL KNEE REVISION Right 07/29/2016   Procedure: RIGHT KNEE POLY-LINER EXCHANGE;  Surgeon: Kathryne Hitch, MD;  Location: [facility];  Service: Orthopedics;  Laterality: Right;  . TRIGGER FINGER RELEASE Right 02/21/2013   Procedure: RIGHT RING A-1 PULLEY RELEASE    (MINOR PROCEDURE) ;  Surgeon: Wyn Forster., MD;  [facility];  Service: Orthopedics;  Laterality: Right;    There were no vitals filed for this visit.      Subjective Assessment - 08/29/16 1102    Subjective I did not feel great after last visit.  I had soreness in calf from the massage.    Pertinent History Rt TKA 12/2009, Lt TKA 2014   Limitations Standing;Walking   How long can you stand comfortably? 5-10   How long can you walk comfortably? 5-10   Patient Stated Goals return  to work, normalize gait, reduce pain, improve A/ROM and strength   Currently in Pain? Yes   Pain Score 3    Pain Location Knee   Pain Orientation Right   Pain Descriptors / Indicators Aching;Tightness   Pain Type Surgical pain   Pain Onset 1 to 4 weeks ago   Pain Frequency Constant   Aggravating Factors  standing and walking, bending knee   Pain Relieving Factors ice, elevation, rest, pain medication   Multiple Pain Sites No            [facility] PT Assessment - 08/29/16 0001      PROM   Overall PROM  Other (comment)  right knee flexion 122 degrees                     [facility] Adult PT Treatment/Exercise - 08/29/16 0001      Knee/Hip Exercises: Aerobic    Stationary Bike level 0 for A/ROM x 6 minutes     Knee/Hip Exercises: Standing   Forward Step Up Right;10 reps;Hand Hold: 1;Step Height: 4";3 sets   Step Down Right;1 set;15 reps;Hand Hold: 1;Step Height: 2"   Rocker Board 2 minutes   Rebounder 3 directions x 1 minute     Knee/Hip Exercises: Seated   Long Arc Quad Strengthening;Right;2 sets;10 reps;Weights   Long Arc Quad Weight 1 lbs.   Hamstring Curl Strengthening;Right;2 sets;10 reps   Hamstring Limitations red band     Knee/Hip Exercises: Supine   Quad Sets Strengthening;Right;2 sets;10 reps   Quad Sets Limitations assited the right patella upward     Modalities   Modalities Vasopneumatic     Vasopneumatic   Number Minutes Vasopneumatic  15 minutes   Vasopnuematic Location  Knee   Vasopneumatic Pressure Medium   Vasopneumatic Temperature  3 flakes     Manual Therapy   Manual Therapy Soft tissue mobilization;Joint mobilization   Manual therapy comments scar massage   Joint Mobilization mobilization to right patella   Soft tissue mobilization to right knee                  PT Short Term Goals - 08/29/16 1143      PT SHORT TERM GOAL #1   Title be independent in initial HEP   Time 4   Period Weeks   Status Achieved     PT SHORT TERM GOAL #2   Title demonstrate Rt knee A/ROM flexion to > or = to 105 degrees to improve squatting and sit to stand transition   Baseline 60%   Time 4   Period Weeks   Status On-going  PROM is 122 degrees     PT SHORT TERM GOAL #3   Title demonstrate symmetry with ambulation on level surface   Baseline 16 Lt 14 Rt   Time 4   Period Weeks   Status Achieved     PT SHORT TERM GOAL #4   Title report < or = to 4/10 Rt knee pain with standing and walking   Period Weeks   Status Achieved     PT SHORT TERM GOAL #5   Title improve endurance to stand and walk for > or = to 15-20 minutes to improve community function   Time 4   Period Weeks   Status On-going  not all at once            PT Long Term Goals - 08/24/16 1058      PT LONG TERM GOAL #  1   Title be independent in advanced HEP   Status On-going     PT LONG TERM GOAL #2   Title reduce FOTO to < or = to 44% limitation   Status On-going     PT LONG TERM GOAL #3   Title The patient will be able to walk/stand for 35-45 min needed for work duties and grocery shopping   Status On-going     PT LONG TERM GOAL #4   Title report a 75% reduction in Rt knee pain with standing and walking   Status On-going     PT LONG TERM GOAL #5   Title demonstrate Rt knee A/ROM flexion to 115 degrees to improve squatting and descending steps   Status On-going     PT LONG TERM GOAL #6   Title improve Rt knee strength to ascend and descend steps with step-over-step gait with use of 1 rail   Status On-going               Plan - 08/29/16 1104    Clinical Impression Statement Patient has difficulty with right patella moving upward with quad set. Patient has good right knee fleixon at 122 degrees PROM.  Patient had some sharp pain in anterior right knee with step down.  Patient will benefit from skilled therapy to in\mprove right knee ROM and strength.    Rehab Potential Good   PT Frequency 2x / week   PT Duration 8 weeks   PT Treatment/Interventions ADLs/Self Care Home Management;Cryotherapy;Electrical Stimulation;Functional mobility training;Stair training;Gait training;Therapeutic activities;Therapeutic exercise;Neuromuscular re-education;Patient/family education;Passive range of motion;Scar mobilization;Manual techniques;Taping;Vasopneumatic Device   PT Next Visit Plan manual and modalities for AROM and edema management, strength, proprioception   PT Home Exercise Plan progress as needed   Consulted and Agree with Plan of Care Patient      Patient will benefit from skilled therapeutic intervention in order to improve the following deficits and impairments:  Abnormal gait, Decreased activity tolerance,  Decreased strength, Increased edema, Impaired flexibility, Pain, Decreased endurance, Decreased range of motion, Difficulty walking  Visit Diagnosis: Muscle weakness (generalized)  Difficulty in walking, not elsewhere classified  Acute pain of right knee  Localized edema  Stiffness of right knee, not elsewhere classified     Problem List Patient Active Problem List   Diagnosis Date Noted  . 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 Diabetes (HCC) 01/08/2014  . Essential hypertension 01/08/2014  . Hyperlipidemia 01/08/2014  . Postoperative anemia due to acute blood loss 11/22/2012  . Hyponatremia 11/06/2012  . OA (osteoarthritis) of knee 11/05/2012  . Villonodular synovitis of knee 04/20/2011    Eulis Foster, PT 08/29/16 11:44 AM    [facility] [address] [place], [place], [address] Phone: [phone]   Fax:  [phone]  Name: [name] MRN: [id number] Date of Birth: 1950/10/13

## 2016-09-01 ENCOUNTER — Telehealth (INDEPENDENT_AMBULATORY_CARE_PROVIDER_SITE_OTHER): Payer: Self-pay | Admitting: Orthopaedic Surgery

## 2016-09-01 ENCOUNTER — Ambulatory Visit: Payer: 59

## 2016-09-01 DIAGNOSIS — R600 Localized edema: Secondary | ICD-10-CM

## 2016-09-01 DIAGNOSIS — M25661 Stiffness of right knee, not elsewhere classified: Secondary | ICD-10-CM | POA: Diagnosis not present

## 2016-09-01 DIAGNOSIS — M25561 Pain in right knee: Secondary | ICD-10-CM | POA: Diagnosis not present

## 2016-09-01 DIAGNOSIS — M6281 Muscle weakness (generalized): Secondary | ICD-10-CM | POA: Diagnosis not present

## 2016-09-01 DIAGNOSIS — R262 Difficulty in walking, not elsewhere classified: Secondary | ICD-10-CM

## 2016-09-01 MED ORDER — OXYCODONE-ACETAMINOPHEN 5-325 MG PO TABS: 1.0000 | ORAL_TABLET | Freq: Four times a day (QID) | ORAL | 0 refills | Status: DC | PRN

## 2016-09-01 NOTE — Telephone Encounter (Signed)
Can come and pick up a script 

## 2016-09-01 NOTE — Therapy (Signed)
[facility] [address], [address] [place], [place], [address] Phone: [phone]   Fax:  [phone]  Physical Therapy Treatment  Patient Details  Name: [name] MRN: [id number] Date of Birth: [birth year] Referring Provider: Doneen Poisson, MD  Encounter Date: 09/01/2016      PT End of Session - 09/01/16 1057    Visit Number 6   Number of Visits 10   Date for PT Re-Evaluation 11/10/2016   Authorization Type G-codes based on age   PT Start Time 1013   PT Stop Time 1112   PT Time Calculation (min) 59 min   Activity Tolerance Patient tolerated treatment well   Behavior During Therapy [facility] for tasks assessed/performed      Past Medical History:  Diagnosis Date  . Anemia    hx of  . Arthritis   . Depression   . Diabetes mellitus ORAL MEDS  . GERD (gastroesophageal reflux disease)    occasional  . History of kidney stones   . Hyperlipidemia   . Hypertension   . Hypothyroidism   . Knee pain, right   . 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 OSA on CPAP   . Osteoarthritis   . Pinched nerve in neck   . Pneumonia    hx of  . PONV (postoperative nausea and vomiting)    for 3-4 days after general anesthesia  . Swelling of knee joint, right   . Synovitis of knee RIGHT    Past Surgical History:  Procedure Laterality Date  . CESAREAN SECTION  X3  . KNEE ARTHROSCOPY  04/20/2011   Procedure: ARTHROSCOPY KNEE;  Surgeon: Loanne Drilling, MD;  Location: [facility];  Service: Orthopedics;  Laterality: Right;  WITH SYNOVECTOMY  . LEFT CARPAL TUNNEL / LEFT MIDDLE & RING FINGER TRIGGER RELEASE  08-26-2008  . LEFT SHOULDER ARTHROSCOPY W/ DEBRIDEMENT  09-09-2003  . LEFT SHOULDER ARTHROSCOPY/ LEFT THUMB TRIGGER RELEASE  02-22-2005  . PULLEY RELEASE LEFT LONG FINGER  07-14-2009  . RIGHT CARPAL TUNNEL/ RIGHT THUMB TRIGGER RELEASE'S  11-28-2006  . RIGHT SHOULDER ARTHROSCOPY W/ ROTATOR CUFF REPAIR   01-13-2004  . SHOULDER ARTHROSCOPY DISTAL CLAVICLE EXCISION AND OPEN ROTATOR CUFF REPAIR  09-07-2004   LEFT  . SHOULDER ARTHROSCOPY W/ ACROMIAL REPAIR  11-29-2005   LEFT  . TONSILLECTOMY AND ADENOIDECTOMY  child  . TOTAL KNEE ARTHROPLASTY  12-14-2009   RIGHT  . TOTAL KNEE ARTHROPLASTY Left 11/05/2012   Procedure: LEFT TOTAL KNEE ARTHROPLASTY;  Surgeon: Loanne Drilling, MD;  Location: [facility];  Service: Orthopedics;  Laterality: Left;  . TOTAL KNEE REVISION Right 07/29/2016   Procedure: RIGHT KNEE POLY-LINER EXCHANGE;  Surgeon: Kathryne Hitch, MD;  Location: [facility];  Service: Orthopedics;  Laterality: Right;  . TRIGGER FINGER RELEASE Right 02/21/2013   Procedure: RIGHT RING A-1 PULLEY RELEASE    (MINOR PROCEDURE) ;  Surgeon: Wyn Forster., MD;  Location[facility];  Service: Orthopedics;  Laterality: Right;    There were no vitals filed for this visit.      Subjective Assessment - 09/01/16 1026    Subjective I was stiff this morning.  Feeling better now.     Currently in Pain? Yes   Pain Score 3    Pain Location Knee   Pain Orientation Right   Pain Descriptors / Indicators Aching;Tightness  [facility] Adult PT Treatment/Exercise - 09/01/16 0001      Knee/Hip Exercises: Aerobic   Stationary Bike level 0 for A/ROM x 8 minutes     Knee/Hip Exercises: Standing   Forward Step Up Right;10 reps;Hand Hold: 1;3 sets;Step Height: 6"   Rocker Board 3 minutes   Rebounder 3 directions x 1 minute     Knee/Hip Exercises: Seated   Long Arc Quad Strengthening;Right;2 sets;10 reps;Weights   Long Arc Quad Weight 1 lbs.   Hamstring Curl Strengthening;Right;2 sets;10 reps   Hamstring Limitations red band     Modalities   Modalities Vasopneumatic     Vasopneumatic   Number Minutes Vasopneumatic  15 minutes   Vasopnuematic Location  Knee   Vasopneumatic Pressure Medium   Vasopneumatic Temperature  3 flakes     Manual  Therapy   Manual Therapy Soft tissue mobilization;Joint mobilization   Manual therapy comments scar massage and distal quads and hamstrings   Joint Mobilization mobilization to right patella    Soft tissue mobilization to right knee                  PT Short Term Goals - 08/29/16 1143      PT SHORT TERM GOAL #1   Title be independent in initial HEP   Time 4   Period Weeks   Status Achieved     PT SHORT TERM GOAL #2   Title demonstrate Rt knee A/ROM flexion to > or = to 105 degrees to improve squatting and sit to stand transition   Baseline 60%   Time 4   Period Weeks   Status On-going  PROM is 122 degrees     PT SHORT TERM GOAL #3   Title demonstrate symmetry with ambulation on level surface   Baseline 16 Lt 14 Rt   Time 4   Period Weeks   Status Achieved     PT SHORT TERM GOAL #4   Title report < or = to 4/10 Rt knee pain with standing and walking   Period Weeks   Status Achieved     PT SHORT TERM GOAL #5   Title improve endurance to stand and walk for > or = to 15-20 minutes to improve community function   Time 4   Period Weeks   Status On-going  not all at once           PT Long Term Goals - 08/24/16 1058      PT LONG TERM GOAL #1   Title be independent in advanced HEP   Status On-going     PT LONG TERM GOAL #2   Title reduce FOTO to < or = to 44% limitation   Status On-going     PT LONG TERM GOAL #3   Title The patient will be able to walk/stand for 35-45 min needed for work duties and grocery shopping   Status On-going     PT LONG TERM GOAL #4   Title report a 75% reduction in Rt knee pain with standing and walking   Status On-going     PT LONG TERM GOAL #5   Title demonstrate Rt knee A/ROM flexion to 115 degrees to improve squatting and descending steps   Status On-going     PT LONG TERM GOAL #6   Title improve Rt knee strength to ascend and descend steps with step-over-step gait with use of 1 rail   Status On-going  Plan - 09/01/16 1026    Clinical Impression Statement Pt with continued hamstring stiffness/soreness with activity.  Pt with improved Rt knee flexion this week. Pt able to step-up on to 6" step today with good quad activation.  Pt ambulates without device with mild antalgia.  Pt will continue to benefit from skilled PT for knee strength, endurance, flexibility and edema management.     Rehab Potential Good   PT Frequency 2x / week   PT Duration 8 weeks   PT Treatment/Interventions ADLs/Self Care Home Management;Cryotherapy;Electrical Stimulation;Functional mobility training;Stair training;Gait training;Therapeutic activities;Therapeutic exercise;Neuromuscular re-education;Patient/family education;Passive range of motion;Scar mobilization;Manual techniques;Taping;Vasopneumatic Device   PT Next Visit Plan manual and modalities for AROM and edema management, strength, proprioception   Consulted and Agree with Plan of Care Patient      Patient will benefit from skilled therapeutic intervention in order to improve the following deficits and impairments:  Abnormal gait, Decreased activity tolerance, Decreased strength, Increased edema, Impaired flexibility, Pain, Decreased endurance, Decreased range of motion, Difficulty walking  Visit Diagnosis: Muscle weakness (generalized)  Difficulty in walking, not elsewhere classified  Acute pain of right knee  Localized edema  Stiffness of right knee, not elsewhere classified     Problem List Patient Active Problem List   Diagnosis Date Noted  . 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 Diabetes (HCC) 01/08/2014  . Essential hypertension 01/08/2014  . Hyperlipidemia 01/08/2014  . Postoperative anemia due to acute blood loss 11/22/2012  . Hyponatremia 11/06/2012  . OA  (osteoarthritis) of knee 11/05/2012  . Villonodular synovitis of knee 04/20/2011     Lorrene ReidKelly Oleg Oleson, PT 09/01/16 10:58 AM  [facility] [address], [address] [place], [place], [address] Phone: [phone]   Fax:  [phone]  Name: Alinda DeemBonnie D Denk MRN: [id number] Date of Birth: 04-23-1950

## 2016-09-01 NOTE — Telephone Encounter (Signed)
Patient aware Rx ready at front desk  

## 2016-09-01 NOTE — Telephone Encounter (Signed)
Please advise 

## 2016-09-01 NOTE — Telephone Encounter (Signed)
PT REQUESTS REFILL OF OXYCODONE PLEASE.  [phone]

## 2016-09-02 MED FILL — OXYCODONE-ACETAMINOPHEN 5-3: 5-325 | 7 days supply | Qty: 60 | Fill #0

## 2016-09-05 ENCOUNTER — Ambulatory Visit: Payer: 59

## 2016-09-05 DIAGNOSIS — R262 Difficulty in walking, not elsewhere classified: Secondary | ICD-10-CM | POA: Diagnosis not present

## 2016-09-05 DIAGNOSIS — M25661 Stiffness of right knee, not elsewhere classified: Secondary | ICD-10-CM

## 2016-09-05 DIAGNOSIS — M25561 Pain in right knee: Secondary | ICD-10-CM | POA: Diagnosis not present

## 2016-09-05 DIAGNOSIS — M6281 Muscle weakness (generalized): Secondary | ICD-10-CM | POA: Diagnosis not present

## 2016-09-05 DIAGNOSIS — R600 Localized edema: Secondary | ICD-10-CM

## 2016-09-05 NOTE — Therapy (Signed)
[facility] [address] [place], [place], [address] Phone: [phone]   Fax:  [phone]  Physical Therapy Treatment  Patient Details  Name: [name] MRN: [id number] Date of Birth: 05-23-1950 Referring Provider: Doneen Poisson, MD  Encounter Date: 09/05/2016      PT End of Session - 09/05/16 1211    Visit Number 7   Number of Visits 10   Date for PT Re-Evaluation Nov 04, 2016   Authorization Type G-codes based on age   PT Start Time 1138   PT Stop Time 1240   PT Time Calculation (min) 62 min   Activity Tolerance Patient tolerated treatment well   Behavior During Therapy [facility] for tasks assessed/performed      Past Medical History:  Diagnosis Date  . Anemia    hx of  . Arthritis   . Depression   . Diabetes mellitus ORAL MEDS  . GERD (gastroesophageal reflux disease)    occasional  . History of kidney stones   . Hyperlipidemia   . Hypertension   . Hypothyroidism   . Knee pain, right   . 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 OSA on CPAP   . Osteoarthritis   . Pinched nerve in neck   . Pneumonia    hx of  . PONV (postoperative nausea and vomiting)    for 3-4 days after general anesthesia  . Swelling of knee joint, right   . Synovitis of knee RIGHT    Past Surgical History:  Procedure Laterality Date  . CESAREAN SECTION  X3  . KNEE ARTHROSCOPY  04/20/2011   Procedure: ARTHROSCOPY KNEE;  Surgeon: Loanne Drilling, MD;  Location: [facility];  Service: Orthopedics;  Laterality: Right;  WITH SYNOVECTOMY  . LEFT CARPAL TUNNEL / LEFT MIDDLE & RING FINGER TRIGGER RELEASE  08-26-2008  . LEFT SHOULDER ARTHROSCOPY W/ DEBRIDEMENT  09-09-2003  . LEFT SHOULDER ARTHROSCOPY/ LEFT THUMB TRIGGER RELEASE  02-22-2005  . PULLEY RELEASE LEFT LONG FINGER  07-14-2009  . RIGHT CARPAL TUNNEL/ RIGHT THUMB TRIGGER RELEASE'S  11-28-2006  . RIGHT SHOULDER ARTHROSCOPY W/ ROTATOR CUFF REPAIR   01-13-2004  . SHOULDER ARTHROSCOPY DISTAL CLAVICLE EXCISION AND OPEN ROTATOR CUFF REPAIR  09-07-2004   LEFT  . SHOULDER ARTHROSCOPY W/ ACROMIAL REPAIR  11-29-2005   LEFT  . TONSILLECTOMY AND ADENOIDECTOMY  child  . TOTAL KNEE ARTHROPLASTY  12-14-2009   RIGHT  . TOTAL KNEE ARTHROPLASTY Left 11/05/2012   Procedure: LEFT TOTAL KNEE ARTHROPLASTY;  Surgeon: Loanne Drilling, MD;  Location: [facility];  Service: Orthopedics;  Laterality: Left;  . TOTAL KNEE REVISION Right 07/29/2016   Procedure: RIGHT KNEE POLY-LINER EXCHANGE;  Surgeon: Kathryne Hitch, MD;  Location: [facility];  Service: Orthopedics;  Laterality: Right;  . TRIGGER FINGER RELEASE Right 02/21/2013   Procedure: RIGHT RING A-1 PULLEY RELEASE    (MINOR PROCEDURE) ;  Surgeon: Wyn Forster., MD;  Location: [facility];  Service: Orthopedics;  Laterality: Right;    There were no vitals filed for this visit.      Subjective Assessment - 09/05/16 1152    Subjective I did well over the weekend.  Went for a walk in the morning.     Pertinent History Rt TKA 12/2009, Lt TKA 2014   Currently in Pain? Yes   Pain Score 3    Pain Location Knee   Pain Orientation Right   Pain Descriptors / Indicators Aching;Tightness  Pain Type Surgical pain   Pain Onset 1 to 4 weeks ago   Pain Frequency Constant   Aggravating Factors  standing, walking, bending the knee   Pain Relieving Factors ice, elevation, rest, pain medication            [facility] PT Assessment - 09/05/16 0001      AROM   Right Knee Flexion 110                     [facility] Adult PT Treatment/Exercise - 09/05/16 0001      Knee/Hip Exercises: Aerobic   Stationary Bike Level 1 x 10 minutes    Nustep Level 1 x 8 minutes     Knee/Hip Exercises: Standing   Forward Step Up Right;10 reps;Hand Hold: 1;3 sets;Step Height: 6"   Step Down Right;1 set;15 reps;Hand Hold: 1;Step Height: 2"   Rocker Board 3 minutes   Rebounder 3 directions x 1 minute      Knee/Hip Exercises: Seated   Long Arc Quad Strengthening;Right;2 sets;10 reps;Weights   Long Arc Quad Weight 1 lbs.   Hamstring Curl Strengthening;Right;2 sets;10 reps   Hamstring Limitations red band     Modalities   Modalities Vasopneumatic     Vasopneumatic   Number Minutes Vasopneumatic  15 minutes   Vasopnuematic Location  Knee   Vasopneumatic Pressure Medium   Vasopneumatic Temperature  3 flakes     Manual Therapy   Manual Therapy Soft tissue mobilization;Joint mobilization   Manual therapy comments scar massage and distal quads and hamstrings   Joint Mobilization mobilization to right patella    Soft tissue mobilization to right knee                  PT Short Term Goals - 09/05/16 1155      PT SHORT TERM GOAL #2   Title demonstrate Rt knee A/ROM flexion to > or = to 105 degrees to improve squatting and sit to stand transition   Status Achieved     PT SHORT TERM GOAL #3   Title demonstrate symmetry with ambulation on level surface     PT SHORT TERM GOAL #5   Title improve endurance to stand and walk for > or = to 15-20 minutes to improve community function   Baseline 10 minutes   Time 4   Period Weeks   Status On-going           PT Long Term Goals - 08/24/16 1058      PT LONG TERM GOAL #1   Title be independent in advanced HEP   Status On-going     PT LONG TERM GOAL #2   Title reduce FOTO to < or = to 44% limitation   Status On-going     PT LONG TERM GOAL #3   Title The patient will be able to walk/stand for 35-45 min needed for work duties and grocery shopping   Status On-going     PT LONG TERM GOAL #4   Title report a 75% reduction in Rt knee pain with standing and walking   Status On-going     PT LONG TERM GOAL #5   Title demonstrate Rt knee A/ROM flexion to 115 degrees to improve squatting and descending steps   Status On-going     PT LONG TERM GOAL #6   Title improve Rt knee strength to ascend and descend steps with  step-over-step gait with use of 1 rail   Status On-going  Plan - 09/05/16 1157    Clinical Impression Statement Pt is making steady progress s/p surgery.  Rt knee flexion A/ROM is 110 degrees today.  Pt able to step-up on 6" step with improved quad activation.  Pt ambulates without device and mild antalgia and pt is able to walk for 10 minutes without limitation.  Pt will continue to benefit from skilled PT for strength, endurance, flexibility and edema management.     Rehab Potential Good   PT Frequency 2x / week   PT Duration 8 weeks   PT Treatment/Interventions ADLs/Self Care Home Management;Cryotherapy;Electrical Stimulation;Functional mobility training;Stair training;Gait training;Therapeutic activities;Therapeutic exercise;Neuromuscular re-education;Patient/family education;Passive range of motion;Scar mobilization;Manual techniques;Taping;Vasopneumatic Device   PT Next Visit Plan manual and modalities for AROM and edema management, strength, proprioception   Recommended Other Services initial plan of care has been signed.   Consulted and Agree with Plan of Care Patient      Patient will benefit from skilled therapeutic intervention in order to improve the following deficits and impairments:  Abnormal gait, Decreased activity tolerance, Decreased strength, Increased edema, Impaired flexibility, Pain, Decreased endurance, Decreased range of motion, Difficulty walking  Visit Diagnosis: Muscle weakness (generalized)  Difficulty in walking, not elsewhere classified  Acute pain of right knee  Localized edema  Stiffness of right knee, not elsewhere classified     Problem List Patient Active Problem List   Diagnosis Date Noted  . 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 Diabetes (HCC) 01/08/2014  . Essential  hypertension 01/08/2014  . Hyperlipidemia 01/08/2014  . Postoperative anemia due to acute blood loss 11/22/2012  . Hyponatremia 11/06/2012  . OA (osteoarthritis) of knee 11/05/2012  . Villonodular synovitis of knee 04/20/2011     Lorrene ReidKelly Mayreli Alden, PT 09/05/16 12:13 PM  [facility] [address], [address] [place], [place], [address] Phone: [phone]   Fax:  [phone]  Name: [name] MRN: [id number] Date of Birth: [birth year]

## 2016-09-06 ENCOUNTER — Telehealth (INDEPENDENT_AMBULATORY_CARE_PROVIDER_SITE_OTHER): Payer: Self-pay | Admitting: Orthopaedic Surgery

## 2016-09-06 NOTE — Telephone Encounter (Signed)
I'm ok with that, but which finger does she want.  Both index fingers were injected.  I'm fine with doing both if that is what she wants.  The difficulty is that she is a [employer] employee, so we need to schedule it within the [facility] [facility].

## 2016-09-06 NOTE — Telephone Encounter (Signed)
See message.

## 2016-09-06 NOTE — Telephone Encounter (Signed)
PT CALLED AND STATED SHE WOULD LIKE TO [facility] TRIGGER FINGER SURGERY BEFORE SHE IS SUPPOSED TO RETURN BACK TO WORK. PLESAE GIVE HER A CALL AS SOON AS POSSIBLE.

## 2016-09-07 ENCOUNTER — Ambulatory Visit: Payer: 59

## 2016-09-07 DIAGNOSIS — R262 Difficulty in walking, not elsewhere classified: Secondary | ICD-10-CM | POA: Diagnosis not present

## 2016-09-07 DIAGNOSIS — M6281 Muscle weakness (generalized): Secondary | ICD-10-CM

## 2016-09-07 DIAGNOSIS — M25561 Pain in right knee: Secondary | ICD-10-CM

## 2016-09-07 DIAGNOSIS — M25661 Stiffness of right knee, not elsewhere classified: Secondary | ICD-10-CM

## 2016-09-07 DIAGNOSIS — R600 Localized edema: Secondary | ICD-10-CM

## 2016-09-07 NOTE — Therapy (Signed)
[facility] [address] [place], [place], [address] Phone: [phone]   Fax:  [phone]  Physical Therapy Treatment  Patient Details  Name: [name] MRN: [id number] Date of Birth: 07-Apr-1950 Referring Provider: Doneen Poisson, MD  Encounter Date: 09/07/2016      PT End of Session - 09/07/16 1444    Visit Number 8   Number of Visits 10   Date for PT Re-Evaluation 10-17-2016   Authorization Type G-codes based on age   PT Start Time 1400   PT Stop Time 1457   PT Time Calculation (min) 57 min   Activity Tolerance Patient tolerated treatment well   Behavior During Therapy [facility] for tasks assessed/performed      Past Medical History:  Diagnosis Date  . Anemia    hx of  . Arthritis   . Depression   . Diabetes mellitus [facility]AL [facility]  . GERD (gastroesophageal reflux disease)    occasional  . History of kidney stones   . Hyperlipidemia   . Hypertension   . Hypothyroidism   . Knee pain, right   . 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 OSA on CPAP   . Osteoarthritis   . Pinched nerve in neck   . Pneumonia    hx of  . PONV (postoperative nausea and vomiting)    for 3-4 days after general anesthesia  . Swelling of knee joint, right   . Synovitis of knee RIGHT    Past Surgical History:  Procedure Laterality Date  . CESAREAN SECTION  X3  . KNEE ARTHROSCOPY  04/20/2011   Procedure: ARTHROSCOPY KNEE;  Surgeon: Loanne Drilling, MD;  Location: [facility];  Service: Orthopedics;  Laterality: Right;  WITH SYNOVECTOMY  . LEFT CARPAL TUNNEL / LEFT MIDDLE & RING FINGER TRIGGER RELEASE  08-26-2008  . LEFT SHOULDER ARTHROSCOPY W/ DEBRIDEMENT  09-09-2003  . LEFT SHOULDER ARTHROSCOPY/ LEFT THUMB TRIGGER RELEASE  02-22-2005  . PULLEY RELEASE LEFT LONG FINGER  07-14-2009  . RIGHT CARPAL TUNNEL/ RIGHT THUMB TRIGGER RELEASE'S  11-28-2006  . RIGHT SHOULDER ARTHROSCOPY W/ ROTATOR CUFF REPAIR   01-13-2004  . SHOULDER ARTHROSCOPY DISTAL CLAVICLE EXCISION AND OPEN ROTATOR CUFF REPAIR  09-07-2004   LEFT  . SHOULDER ARTHROSCOPY W/ ACROMIAL REPAIR  11-29-2005   LEFT  . TONSILLECTOMY AND ADENOIDECTOMY  child  . TOTAL KNEE ARTHROPLASTY  12-14-2009   RIGHT  . TOTAL KNEE ARTHROPLASTY Left 11/05/2012   Procedure: LEFT TOTAL KNEE ARTHROPLASTY;  Surgeon: Loanne Drilling, MD;  Location: [facility];  Service: Orthopedics;  Laterality: Left;  . TOTAL KNEE REVISION Right 07/29/2016   Procedure: RIGHT KNEE POLY-LINER EXCHANGE;  Surgeon: Kathryne Hitch, MD;  Location: [facility];  Service: Orthopedics;  Laterality: Right;  . TRIGGER FINGER RELEASE Right 02/21/2013   Procedure: RIGHT RING A-1 PULLEY RELEASE    (MINOR PROCEDURE) ;  Surgeon: Wyn Forster., MD;  Location: [facility];  Service: Orthopedics;  Laterality: Right;    There were no vitals filed for this visit.      Subjective Assessment - 09/07/16 1407    Subjective Incresaed knee pain today.     Pertinent History Rt TKA 12/2009, Lt TKA 2014   Currently in Pain? Yes   Pain Score 5    Pain Location Knee   Pain Orientation Right   Pain Descriptors / Indicators Aching;Tightness   Pain Type Surgical pain  [facility] Adult PT Treatment/Exercise - 09/07/16 0001      Knee/Hip Exercises: Aerobic   Stationary Bike Level 1 x 10 minutes    Nustep --     Knee/Hip Exercises: Standing   Forward Step Up Right;10 reps;Hand Hold: 1;3 sets;Step Height: 6"   Step Down --   Rocker Board 3 minutes   Rebounder 3 directions x 1 minute     Knee/Hip Exercises: Seated   Long Arc Quad Strengthening;Right;2 sets;10 reps;Weights   Long Arc Quad Weight 1 lbs.   Hamstring Curl Strengthening;Right;2 sets;10 reps   Hamstring Limitations red band   Sit to Sand 20 reps;without UE support     Modalities   Modalities Vasopneumatic     Vasopneumatic   Number Minutes Vasopneumatic  15 minutes    Vasopnuematic Location  Knee   Vasopneumatic Pressure Medium   Vasopneumatic Temperature  3 flakes     Manual Therapy   Manual Therapy Soft tissue mobilization;Joint mobilization   Manual therapy comments scar massage and distal quads and hamstrings   Joint Mobilization mobilization to right patella    Soft tissue mobilization to right knee                  PT Short Term Goals - 09/05/16 1155      PT SHORT TERM GOAL #2   Title demonstrate Rt knee A/ROM flexion to > or = to 105 degrees to improve squatting and sit to stand transition   Status Achieved     PT SHORT TERM GOAL #3   Title demonstrate symmetry with ambulation on level surface     PT SHORT TERM GOAL #5   Title improve endurance to stand and walk for > or = to 15-20 minutes to improve community function   Baseline 10 minutes   Time 4   Period Weeks   Status On-going           PT Long Term Goals - 08/24/16 1058      PT LONG TERM GOAL #1   Title be independent in advanced HEP   Status On-going     PT LONG TERM GOAL #2   Title reduce FOTO to < or = to 44% limitation   Status On-going     PT LONG TERM GOAL #3   Title The patient will be able to walk/stand for 35-45 min needed for work duties and grocery shopping   Status On-going     PT LONG TERM GOAL #4   Title report a 75% reduction in Rt knee pain with standing and walking   Status On-going     PT LONG TERM GOAL #5   Title demonstrate Rt knee A/ROM flexion to 115 degrees to improve squatting and descending steps   Status On-going     PT LONG TERM GOAL #6   Title improve Rt knee strength to ascend and descend steps with step-over-step gait with use of 1 rail   Status On-going               Plan - 09/07/16 1414    Clinical Impression Statement Pt is making steady progress s/p surgery.  Rt knee A/ROM is incresaed to 110 degrees.  Pt with continued difficulty with step-down exercise.  Pt ambulates without device and mild antalgia and  pt is able to walk for 10 minutes without limitation.  Pt will continue t obenefit from skilled PT for strength, endurance, flexibility and edema management.     Rehab Potential  Good   PT Frequency 2x / week   PT Duration 8 weeks   PT Treatment/Interventions ADLs/Self Care Home Management;Cryotherapy;Electrical Stimulation;Functional mobility training;Stair training;Gait training;Therapeutic activities;Therapeutic exercise;Neuromuscular re-education;Patient/family education;Passive range of motion;Scar mobilization;Manual techniques;Taping;Vasopneumatic Device   PT Next Visit Plan manual and modalities for AROM and edema management, strength, proprioception   Consulted and Agree with Plan of Care Patient      Patient will benefit from skilled therapeutic intervention in order to improve the following deficits and impairments:  Abnormal gait, Decreased activity tolerance, Decreased strength, Increased edema, Impaired flexibility, Pain, Decreased endurance, Decreased range of motion, Difficulty walking  Visit Diagnosis: Muscle weakness (generalized)  Difficulty in walking, not elsewhere classified  Acute pain of right knee  Localized edema  Stiffness of right knee, not elsewhere classified     Problem List Patient Active Problem List   Diagnosis Date Noted  . 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 Diabetes (HCC) 01/08/2014  . Essential hypertension 01/08/2014  . Hyperlipidemia 01/08/2014  . Postoperative anemia due to acute blood loss 11/22/2012  . Hyponatremia 11/06/2012  . OA (osteoarthritis) of knee 11/05/2012  . Villonodular synovitis of knee 04/20/2011     Lorrene Reid, PT 09/07/16 2:46 PM  [facility] [address], [address] [place], [place], [address] Phone:  [phone]   Fax:  [phone]  Name: [name] MRN: [id number] Date of Birth: [birth year]

## 2016-09-08 ENCOUNTER — Ambulatory Visit (INDEPENDENT_AMBULATORY_CARE_PROVIDER_SITE_OTHER): Payer: 59 | Admitting: Orthopaedic Surgery

## 2016-09-08 DIAGNOSIS — Z794 Long term (current) use of insulin: Secondary | ICD-10-CM | POA: Diagnosis not present

## 2016-09-08 DIAGNOSIS — Z8639 Personal history of other endocrine, nutritional and metabolic disease: Secondary | ICD-10-CM | POA: Diagnosis not present

## 2016-09-08 DIAGNOSIS — E113299 Type 2 diabetes mellitus with mild nonproliferative diabetic retinopathy without macular edema, unspecified eye: Secondary | ICD-10-CM | POA: Diagnosis not present

## 2016-09-08 DIAGNOSIS — Z5181 Encounter for therapeutic drug level monitoring: Secondary | ICD-10-CM | POA: Diagnosis not present

## 2016-09-08 DIAGNOSIS — E1165 Type 2 diabetes mellitus with hyperglycemia: Secondary | ICD-10-CM | POA: Diagnosis not present

## 2016-09-08 NOTE — Telephone Encounter (Signed)
Left Index Finger  She's trying to do this before she goes back to work from her knee surgery Also she is a Producer, [employer] employee, [facility]?

## 2016-09-09 DIAGNOSIS — G4733 Obstructive sleep apnea (adult) (pediatric): Secondary | ICD-10-CM | POA: Diagnosis not present

## 2016-09-12 ENCOUNTER — Ambulatory Visit (HOSPITAL_COMMUNITY)
Admission: RE | Admit: 2016-09-12 | Discharge: 2016-09-12 | Disposition: A | Discharge status: Home / Self Care | Payer: 59 | Source: Ambulatory Visit | Attending: Nurse Practitioner | Admitting: Nurse Practitioner

## 2016-09-12 VITALS — BP 136/68 | HR 74 | Ht 62.0 in | Wt 258.0 lb

## 2016-09-12 DIAGNOSIS — E785 Hyperlipidemia, unspecified: Secondary | ICD-10-CM | POA: Diagnosis not present

## 2016-09-12 DIAGNOSIS — F329 Major depressive disorder, single episode, unspecified: Secondary | ICD-10-CM | POA: Diagnosis not present

## 2016-09-12 DIAGNOSIS — E039 Hypothyroidism, unspecified: Secondary | ICD-10-CM | POA: Diagnosis not present

## 2016-09-12 DIAGNOSIS — Z87891 Personal history of nicotine dependence: Secondary | ICD-10-CM | POA: Diagnosis not present

## 2016-09-12 DIAGNOSIS — G4733 Obstructive sleep apnea (adult) (pediatric): Secondary | ICD-10-CM | POA: Insufficient documentation

## 2016-09-12 DIAGNOSIS — Z79899 Other long term (current) drug therapy: Secondary | ICD-10-CM | POA: Diagnosis not present

## 2016-09-12 DIAGNOSIS — K219 Gastro-esophageal reflux disease without esophagitis: Secondary | ICD-10-CM | POA: Insufficient documentation

## 2016-09-12 DIAGNOSIS — Z794 Long term (current) use of insulin: Secondary | ICD-10-CM | POA: Insufficient documentation

## 2016-09-12 DIAGNOSIS — E119 Type 2 diabetes mellitus without complications: Secondary | ICD-10-CM | POA: Insufficient documentation

## 2016-09-12 DIAGNOSIS — Z96653 Presence of artificial knee joint, bilateral: Secondary | ICD-10-CM | POA: Insufficient documentation

## 2016-09-12 DIAGNOSIS — I480 Paroxysmal atrial fibrillation: Secondary | ICD-10-CM | POA: Diagnosis not present

## 2016-09-12 DIAGNOSIS — Z888 Allergy status to other drugs, medicaments and biological substances status: Secondary | ICD-10-CM | POA: Insufficient documentation

## 2016-09-12 DIAGNOSIS — Z833 Family history of diabetes mellitus: Secondary | ICD-10-CM | POA: Diagnosis not present

## 2016-09-12 DIAGNOSIS — M1990 Unspecified osteoarthritis, unspecified site: Secondary | ICD-10-CM | POA: Insufficient documentation

## 2016-09-12 DIAGNOSIS — Z7901 Long term (current) use of anticoagulants: Secondary | ICD-10-CM | POA: Insufficient documentation

## 2016-09-12 DIAGNOSIS — Z887 Allergy status to serum and vaccine status: Secondary | ICD-10-CM | POA: Insufficient documentation

## 2016-09-12 DIAGNOSIS — I10 Essential (primary) hypertension: Secondary | ICD-10-CM | POA: Diagnosis not present

## 2016-09-12 DIAGNOSIS — Z8249 Family history of ischemic heart disease and other diseases of the circulatory system: Secondary | ICD-10-CM | POA: Diagnosis not present

## 2016-09-12 DIAGNOSIS — Z87442 Personal history of urinary calculi: Secondary | ICD-10-CM | POA: Insufficient documentation

## 2016-09-12 NOTE — Progress Notes (Signed)
Primary Care Physician: Marden Noble, MD Referring Physician:Dr. Zelphia Cairo D [name] is a 66 y.o. female with a h/o paroxysmal afib that was recently documented by  event monitor placed by Dr. Eden Emms. She was  placed on flecainide, metoprolol 25 mg bid and xarelto 20 mg for a chadsvasc score of at least 4. She has not had any significant afib burden since then. She had knee surgery in May and is recovering from that nicely. No bleeding issues.Denies any alcohol, caffeine, tobacco use. Has OSA with cpap.  Today, she denies symptoms of palpitations, chest pain, shortness of breath, orthopnea, PND, lower extremity edema, dizziness, presyncope, syncope, or neurologic sequela. The patient is tolerating medications without difficulties and is otherwise without complaint today.   Past Medical History:  Diagnosis Date  . Anemia    hx of  . Arthritis   . Depression   . Diabetes mellitus ORAL MEDS  . GERD (gastroesophageal reflux disease)    occasional  . History of kidney stones   . Hyperlipidemia   . Hypertension   . Hypothyroidism   . Knee pain, right   . 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 OSA on CPAP   . Osteoarthritis   . Pinched nerve in neck   . Pneumonia    hx of  . PONV (postoperative nausea and vomiting)    for 3-4 days after general anesthesia  . Swelling of knee joint, right   . Synovitis of knee RIGHT   Past Surgical History:  Procedure Laterality Date  . CESAREAN SECTION  X3  . KNEE ARTHROSCOPY  04/20/2011   Procedure: ARTHROSCOPY KNEE;  Surgeon: Loanne Drilling, MD;  Location: [facility];  Service: Orthopedics;  Laterality: Right;  WITH SYNOVECTOMY  . LEFT CARPAL TUNNEL / LEFT MIDDLE & RING FINGER TRIGGER RELEASE  08-26-2008  . LEFT SHOULDER ARTHROSCOPY W/ DEBRIDEMENT  09-09-2003  . LEFT SHOULDER ARTHROSCOPY/ LEFT THUMB TRIGGER RELEASE  02-22-2005  . PULLEY RELEASE LEFT LONG FINGER  07-14-2009  . RIGHT CARPAL TUNNEL/ RIGHT THUMB  TRIGGER RELEASE'S  11-28-2006  . RIGHT SHOULDER ARTHROSCOPY W/ ROTATOR CUFF REPAIR  01-13-2004  . SHOULDER ARTHROSCOPY DISTAL CLAVICLE EXCISION AND OPEN ROTATOR CUFF REPAIR  09-07-2004   LEFT  . SHOULDER ARTHROSCOPY W/ ACROMIAL REPAIR  11-29-2005   LEFT  . TONSILLECTOMY AND ADENOIDECTOMY  child  . TOTAL KNEE ARTHROPLASTY  12-14-2009   RIGHT  . TOTAL KNEE ARTHROPLASTY Left 11/05/2012   Procedure: LEFT TOTAL KNEE ARTHROPLASTY;  Surgeon: Loanne Drilling, MD;  Location: [facility];  Service: Orthopedics;  Laterality: Left;  . TOTAL KNEE REVISION Right 07/29/2016   Procedure: RIGHT KNEE POLY-LINER EXCHANGE;  Surgeon: Kathryne Hitch, MD;  Location: [facility];  Service: Orthopedics;  Laterality: Right;  . TRIGGER FINGER RELEASE Right 02/21/2013   Procedure: RIGHT RING A-1 PULLEY RELEASE    (MINOR PROCEDURE) ;  Surgeon: Wyn Forster., MD;  Location: [facility];  Service: Orthopedics;  Laterality: Right;    Current Outpatient Prescriptions  Medication Sig Dispense Refill  . amLODipine (NORVASC) 5 MG tablet Take 5 mg by mouth daily.     [name] atorvastatin (LIPITOR) 40 MG tablet Take 40 mg by mouth every evening.     . cholecalciferol (VITAMIN D) 1000 units tablet Take 1,000 Units by mouth every evening.     . DULoxetine (CYMBALTA) 60 MG capsule Take 60 mg by mouth daily.    . flecainide (TAMBOCOR)  50 MG tablet Take 1 tablet (50 mg total) by mouth 2 (two) times daily. 180 tablet 3  . indomethacin (INDOCIN) 50 MG capsule Take 50 mg by mouth 3 (three) times daily as needed (gout).    . insulin glargine (LANTUS) 100 UNIT/ML injection Inject 60 Units into the skin 2 (two) times daily.     . insulin lispro (HUMALOG) 100 UNIT/ML injection Inject 20 Units into the skin daily before supper. Hold if blood sugar is 140 or less    . levothyroxine (SYNTHROID, LEVOTHROID) 100 MCG tablet Take 100 mcg by mouth daily before breakfast.   2  . Liraglutide (VICTOZA) 18 MG/3ML SOLN Inject 1.8 mg into  the skin every evening.     . Melatonin 5 MG CAPS Take 5 mg by mouth at bedtime as needed (sleep).     . metFORMIN (GLUCOPHAGE-XR) 500 MG 24 hr tablet Take 1,000 mg by mouth daily with supper.    . methocarbamol (ROBAXIN) 500 MG tablet Take 1 tablet (500 mg total) by mouth every 6 (six) hours as needed for muscle spasms. 60 tablet 0  . metoprolol tartrate (LOPRESSOR) 25 MG tablet Take 1 tablet (25 mg total) by mouth 2 (two) times daily. 180 tablet 3  . omeprazole (PRILOSEC) 20 MG capsule Take 20 mg by mouth at bedtime.     [name] oxyCODONE-acetaminophen (ROXICET) 5-325 MG tablet Take 1-2 tablets by mouth every 6 (six) hours as needed. 60 tablet 0  . rivaroxaban (XARELTO) 20 MG TABS tablet Take 1 tablet (20 mg total) by mouth daily with supper. 30 tablet 0  . valsartan-hydrochlorothiazide (DIOVAN-HCT) 320-12.5 MG tablet Take 1 tablet by mouth daily.     No current facility-administered medications for this encounter.     Allergies  Allergen Reactions  . Actos [Pioglitazone Hydrochloride] Swelling and Other (See Comments)    Numbness in lips, HEADACHES  . Avandia [Rosiglitazone] Swelling    Numbness in lips, headaches   . Tetanus Toxoids Other (See Comments)    Arm swelling, fainted, ?Respiratory distress  (horse serum)  . Food Nausea Only    Eggplant    Social History   Social History  . Marital status: Single    Spouse name: N/A  . Number of children: N/A  . Years of education: N/A   Occupational History  . Not on file.   Social History Main Topics  . Smoking status: Former Smoker    Years: 5.00    Types: Cigarettes    Quit date: 04/12/1977  . Smokeless tobacco: Never Used  . Alcohol use No  . Drug use: No  . Sexual activity: Not on file   Other Topics Concern  . Not on file   Social History Narrative  . No narrative on file    Family History  Problem Relation Age of Onset  . Diabetes Mother   . Hypertension Mother   . Heart attack Mother     ROS- All systems are  reviewed and negative except as per the HPI above  Physical Exam: Vitals:   09/12/16 1101  BP: 136/68  Pulse: 74  Weight: 258 lb (117 kg)  Height: 5\' 2"  (1.575 m)   Wt Readings from Last 3 Encounters:  09/12/16 258 lb (117 kg)  07/29/16 265 lb (120.2 kg)  07/20/16 265 lb (120.2 kg)    Labs: Lab Results  Component Value Date   NA 139 07/20/2016   K 4.1 07/20/2016   CL 106 07/20/2016   CO2 26  07/20/2016   GLUCOSE 214 (H) 07/20/2016   BUN 32 (H) 07/20/2016   CREATININE 1.18 (H) 07/20/2016   CALCIUM 8.9 07/20/2016   Lab Results  Component Value Date   INR 0.96 10/29/2012   No results found for: CHOL, HDL, LDLCALC, TRIG   GEN- The patient is well appearing, alert and oriented x 3 today.   Head- normocephalic, atraumatic Eyes-  Sclera clear, conjunctiva pink Ears- hearing intact Oropharynx- clear Neck- supple, no JVP Lymph- no cervical lymphadenopathy Lungs- Clear to ausculation bilaterally, normal work of breathing Heart- Regular rate and rhythm, no murmurs, rubs or gallops, PMI not laterally displaced GI- soft, NT, ND, + BS Extremities- no clubbing, cyanosis, or edema MS- no significant deformity or atrophy Skin- no rash or lesion Psych- euthymic mood, full affect Neuro- strength and sensation are intact  EKG-NSR at 74 bpm, pr int 188 ms, qrs int 100 ms, qtc 452 ms Event monitor -Study Conclusions  - Left ventricle: The cavity size was normal. Wall thickness was   increased in a pattern of mild LVH. Systolic function was   vigorous. The estimated ejection fraction was in the range of 65%   to 70%. Wall motion was normal; there were no regional wall   motion abnormalities. Doppler parameters are consistent with   abnormal left ventricular relaxation (grade 1 diastolic   dysfunction). - Aortic valve: There was no stenosis. - Mitral valve: Mildly calcified annulus. There was no significant   regurgitation. - Right ventricle: The cavity size was normal.  Systolic function   was normal. - Tricuspid valve: Peak RV-RA gradient (S): 23 mm Hg. - Pulmonary arteries: PA peak pressure: 26 mm Hg (S). - Inferior vena cava: The vessel was normal in size. The   respirophasic diameter changes were in the normal range (>= 50%),   consistent with normal central venous pressure.  Impressions:  - Normal LV size with mild LV hypertrophy. EF 65-70%. Normal RV   size and systolic function. No significant valvular   abnormalities.  Study Highlights     Nuclear stress EF: 64%. No wall motion abnormalities.  There was no ST segment deviation noted during stress.  This is a low risk study. No ischemia identified. Perfusion is suggestive of left ventricular hypertrophy.   Stefanie SchultzMark Skains, MD      Assessment and Plan: 1. Paroxysmal afib General afib education reviewed with pt Continue flecainide 50 mg bid Continue metoprolol 25 mg bid Continue xarelto 20 mg for a chadsvasc score of at least at least 4 Bleeding precautions discussed   2.lifestyle issues Continue treatment of CPAP Regular exercise encouraged when she recovers from knee surgery Weight loss encouraged   F/u with Dr. Eden EmmsNishan 7/30 afib clinic as needed

## 2016-09-13 ENCOUNTER — Ambulatory Visit: Payer: 59 | Attending: Orthopaedic Surgery | Admitting: Rehabilitative and Restorative Service Providers"

## 2016-09-13 DIAGNOSIS — R262 Difficulty in walking, not elsewhere classified: Secondary | ICD-10-CM | POA: Diagnosis not present

## 2016-09-13 DIAGNOSIS — M6281 Muscle weakness (generalized): Secondary | ICD-10-CM | POA: Diagnosis not present

## 2016-09-13 DIAGNOSIS — M25561 Pain in right knee: Secondary | ICD-10-CM | POA: Diagnosis not present

## 2016-09-13 DIAGNOSIS — M25661 Stiffness of right knee, not elsewhere classified: Secondary | ICD-10-CM | POA: Diagnosis not present

## 2016-09-13 DIAGNOSIS — R600 Localized edema: Secondary | ICD-10-CM | POA: Diagnosis not present

## 2016-09-13 NOTE — Telephone Encounter (Signed)
I spoke with patient and scheduled surgery

## 2016-09-13 NOTE — Therapy (Signed)
[facility] [address] [place], [place], [address] Phone: [phone]   Fax:  [phone]  Physical Therapy Treatment  Patient Details  Name: [name] MRN: [id number] Date of Birth: Feb 25, 1951 Referring Provider: Doneen Poisson, MD  Encounter Date: 09/13/2016      PT End of Session - 09/13/16 1057    Visit Number 9   Number of Visits 10   Date for PT Re-Evaluation 2016-10-29   Authorization Type G-codes based on age   PT Start Time 1018   PT Stop Time 1116   PT Time Calculation (min) 58 min   Activity Tolerance Patient tolerated treatment well   Behavior During Therapy [facility] for tasks assessed/performed      Past Medical History:  Diagnosis Date  . Anemia    hx of  . Arthritis   . Depression   . Diabetes mellitus [facility]  . GERD (gastroesophageal reflux disease)    occasional  . History of kidney stones   . Hyperlipidemia   . Hypertension   . Hypothyroidism   . Knee pain, right   . 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 OSA on CPAP   . Osteoarthritis   . Pinched nerve in neck   . Pneumonia    hx of  . PONV (postoperative nausea and vomiting)    for 3-4 days after general anesthesia  . Swelling of knee joint, right   . Synovitis of knee RIGHT    Past Surgical History:  Procedure Laterality Date  . CESAREAN SECTION  X3  . KNEE ARTHROSCOPY  04/20/2011   Procedure: ARTHROSCOPY KNEE;  Surgeon: Loanne Drilling, MD;  Location: [facility];  Service: Orthopedics;  Laterality: Right;  WITH SYNOVECTOMY  . LEFT CARPAL TUNNEL / LEFT MIDDLE & RING FINGER TRIGGER RELEASE  08-26-2008  . LEFT SHOULDER ARTHROSCOPY W/ DEBRIDEMENT  09-09-2003  . LEFT SHOULDER ARTHROSCOPY/ LEFT THUMB TRIGGER RELEASE  02-22-2005  . PULLEY RELEASE LEFT LONG FINGER  07-14-2009  . RIGHT CARPAL TUNNEL/ RIGHT THUMB TRIGGER RELEASE'S  11-28-2006  . RIGHT SHOULDER ARTHROSCOPY W/ ROTATOR CUFF REPAIR   01-13-2004  . SHOULDER ARTHROSCOPY DISTAL CLAVICLE EXCISION AND OPEN ROTATOR CUFF REPAIR  09-07-2004   LEFT  . SHOULDER ARTHROSCOPY W/ ACROMIAL REPAIR  11-29-2005   LEFT  . TONSILLECTOMY AND ADENOIDECTOMY  child  . TOTAL KNEE ARTHROPLASTY  12-14-2009   RIGHT  . TOTAL KNEE ARTHROPLASTY Left 11/05/2012   Procedure: LEFT TOTAL KNEE ARTHROPLASTY;  Surgeon: Loanne Drilling, MD;  Location: [facility];  Service: Orthopedics;  Laterality: Left;  . TOTAL KNEE REVISION Right 07/29/2016   Procedure: RIGHT KNEE POLY-LINER EXCHANGE;  Surgeon: Kathryne Hitch, MD;  Location: [facility];  Service: Orthopedics;  Laterality: Right;  . TRIGGER FINGER RELEASE Right 02/21/2013   Procedure: RIGHT RING A-1 PULLEY RELEASE    (MINOR PROCEDURE) ;  Surgeon: Wyn Forster., MD;  Location: [facility];  Service: Orthopedics;  Laterality: Right;    There were no vitals filed for this visit.      Subjective Assessment - 09/13/16 1022    Subjective 2-3/10 knee pain but it is still early today   Pertinent History Rt TKA 12/2009, Lt TKA 2014   Limitations Standing;Walking   How long can you stand comfortably? 5-10   How long can you walk comfortably? 5-10   Patient Stated Goals return to work, normalize gait, reduce pain, improve A/ROM and  strength   Currently in Pain? Yes   Pain Score 3    Pain Location Knee   Pain Orientation Right   Pain Descriptors / Indicators Aching;Tightness   Pain Type Surgical pain   Pain Onset 1 to 4 weeks ago   Pain Frequency Intermittent   Aggravating Factors  standing, walking, bending knee   Pain Relieving Factors ice, elevation, pain meds   Multiple Pain Sites No                         [facility] Adult PT Treatment/Exercise - 09/13/16 0001      Knee/Hip Exercises: Standing   Other Standing Knee Exercises L 6 inch step tap x 20; R 6 inch step up frontal/lateral x 20; hamstring stretch 2x30 sec     Knee/Hip Exercises: Supine   Other Supine  Knee/Hip Exercises SLR x 10; R SLR x 20; R SLR with hip circles clockwise/counterclockwise x 20; bridge x 20 with knee flexed as much as possible;; SLR alphabet x 1 set; unilat bridge R LE only x 20     Vasopneumatic   Number Minutes Vasopneumatic  15 minutes   Vasopnuematic Location  Knee   Vasopneumatic Pressure Medium   Vasopneumatic Temperature  3 flakes                  PT Short Term Goals - 09/13/16 1052      PT SHORT TERM GOAL #1   Title be independent in initial HEP   Status Achieved     PT SHORT TERM GOAL #2   Title demonstrate Rt knee A/ROM flexion to > or = to 105 degrees to improve squatting and sit to stand transition   Time 4   Status Achieved     PT SHORT TERM GOAL #3   Title demonstrate symmetry with ambulation on level surface   Status Achieved     PT SHORT TERM GOAL #4   Title report < or = to 4/10 Rt knee pain with standing and walking   Status Achieved     PT SHORT TERM GOAL #5   Title improve endurance to stand and walk for > or = to 15-20 minutes to improve community function   Baseline 1 hour   Status Achieved           PT Long Term Goals - 09/13/16 1054      PT LONG TERM GOAL #1   Title be independent in advanced HEP   Time 8   Period Weeks   Status On-going     PT LONG TERM GOAL #2   Title reduce FOTO to < or = to 44% limitation   Time 8   Period Weeks   Status On-going     PT LONG TERM GOAL #3   Title The patient will be able to walk/stand for 1.5 hours needed for work duties and grocery shopping   Baseline 1 hour this weekend   Time 8   Period Weeks   Status Revised     PT LONG TERM GOAL #4   Title report a 75% reduction in Rt knee pain with standing and walking   Baseline 50%   Time 8   Period Weeks   Status On-going     PT LONG TERM GOAL #5   Title demonstrate Rt knee A/ROM flexion to 115 degrees to improve squatting and descending steps   Time 8   Period Weeks  Status On-going     PT LONG TERM GOAL #6    Title improve Rt knee strength to ascend and descend steps with step-over-step gait with use of 1 rail   Time 8   Period Weeks   Status On-going               Plan - 09/13/16 1050    Clinical Impression Statement Pt is continuing to make progress s/p surgery. knee ROM is functional; functional strength is primary limiting factor. Pt would benefit from PT for further R knee ROM/strengthening, GT to increase = WB, flexibility, and edema management.   Rehab Potential Good   PT Frequency 2x / week   PT Duration 8 weeks   PT Treatment/Interventions ADLs/Self Care Home Management;Cryotherapy;Electrical Stimulation;Functional mobility training;Stair training;Gait training;Therapeutic activities;Therapeutic exercise;Neuromuscular re-education;Patient/family education;Passive range of motion;Scar mobilization;Manual techniques;Taping;Vasopneumatic Device   PT Next Visit Plan manual and modalities for AROM and edema management, strength, proprioception   PT Home Exercise Plan progress as needed   Consulted and Agree with Plan of Care Patient      Patient will benefit from skilled therapeutic intervention in order to improve the following deficits and impairments:  Abnormal gait, Decreased activity tolerance, Decreased strength, Increased edema, Impaired flexibility, Pain, Decreased endurance, Decreased range of motion, Difficulty walking  Visit Diagnosis: Muscle weakness (generalized)  Difficulty in walking, not elsewhere classified  Acute pain of right knee  Localized edema  Stiffness of right knee, not elsewhere classified     Problem List Patient Active Problem List   Diagnosis Date Noted  . 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 Diabetes (HCC) 01/08/2014  . Essential hypertension 01/08/2014  . Hyperlipidemia 01/08/2014   . Postoperative anemia due to acute blood loss 11/22/2012  . Hyponatremia 11/06/2012  . OA (osteoarthritis) of knee 11/05/2012  . Villonodular synovitis of knee 04/20/2011    ARTIS,Srihan Brutus, PT 09/13/2016, 10:59 AM  [facility] [address] [place], [place], [address] Phone: [phone]   Fax:  [phone]  Name: GEAN LAURSEN MRN: [id number] Date of Birth: September 05, 1950

## 2016-09-16 ENCOUNTER — Ambulatory Visit: Payer: 59 | Admitting: Physical Therapy

## 2016-09-16 DIAGNOSIS — M25661 Stiffness of right knee, not elsewhere classified: Secondary | ICD-10-CM | POA: Diagnosis not present

## 2016-09-16 DIAGNOSIS — R600 Localized edema: Secondary | ICD-10-CM

## 2016-09-16 DIAGNOSIS — R262 Difficulty in walking, not elsewhere classified: Secondary | ICD-10-CM | POA: Diagnosis not present

## 2016-09-16 DIAGNOSIS — M6281 Muscle weakness (generalized): Secondary | ICD-10-CM | POA: Diagnosis not present

## 2016-09-16 DIAGNOSIS — M25561 Pain in right knee: Secondary | ICD-10-CM

## 2016-09-16 NOTE — Therapy (Signed)
[facility] [address] [place], [place], [address] Phone: [phone]   Fax:  [phone]  Physical Therapy Treatment  Patient Details  Name: [name] MRN: [id number] Date of Birth: 1951-02-03 Referring Provider: Doneen PoissonBlackman, Christopher, MD  Encounter Date: 09/16/2016      PT End of Session - 09/16/16 0849    Visit Number 10   Number of Visits 20   Date for PT Re-Evaluation 10/11/16   Authorization Type G-codes based on age   PT Start Time 0800   PT Stop Time 0903   PT Time Calculation (min) 63 min   Activity Tolerance Patient tolerated treatment well      Past Medical History:  Diagnosis Date  . Anemia    hx of  . Arthritis   . Depression   . Diabetes mellitus [facility]  . GERD (gastroesophageal reflux disease)    occasional  . History of kidney stones   . Hyperlipidemia   . Hypertension   . Hypothyroidism   . Knee pain, right   . 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 OSA on CPAP   . Osteoarthritis   . Pinched nerve in neck   . Pneumonia    hx of  . PONV (postoperative nausea and vomiting)    for 3-4 days after general anesthesia  . Swelling of knee joint, right   . Synovitis of knee RIGHT    Past Surgical History:  Procedure Laterality Date  . CESAREAN SECTION  X3  . KNEE ARTHROSCOPY  04/20/2011   Procedure: ARTHROSCOPY KNEE;  Surgeon: Loanne DrillingFrank V Aluisio, MD;  [facility];  Service: Orthopedics;  Laterality: Right;  WITH SYNOVECTOMY  . LEFT CARPAL TUNNEL / LEFT MIDDLE & RING FINGER TRIGGER RELEASE  08-26-2008  . LEFT SHOULDER ARTHROSCOPY W/ DEBRIDEMENT  09-09-2003  . LEFT SHOULDER ARTHROSCOPY/ LEFT THUMB TRIGGER RELEASE  02-22-2005  . PULLEY RELEASE LEFT LONG FINGER  07-14-2009  . RIGHT CARPAL TUNNEL/ RIGHT THUMB TRIGGER RELEASE'S  11-28-2006  . RIGHT SHOULDER ARTHROSCOPY W/ ROTATOR CUFF REPAIR  01-13-2004  . SHOULDER ARTHROSCOPY DISTAL CLAVICLE  EXCISION AND OPEN ROTATOR CUFF REPAIR  09-07-2004   LEFT  . SHOULDER ARTHROSCOPY W/ ACROMIAL REPAIR  11-29-2005   LEFT  . TONSILLECTOMY AND ADENOIDECTOMY  child  . TOTAL KNEE ARTHROPLASTY  12-14-2009   RIGHT  . TOTAL KNEE ARTHROPLASTY Left 11/05/2012   Procedure: LEFT TOTAL KNEE ARTHROPLASTY;  Surgeon: Loanne DrillingFrank V Aluisio, MD;  Location: [facility];  Service: Orthopedics;  Laterality: Left;  . TOTAL KNEE REVISION Right 07/29/2016   Procedure: RIGHT KNEE POLY-LINER EXCHANGE;  Surgeon: Kathryne HitchBlackman, Christopher Y, MD;  Location: [facility];  Service: Orthopedics;  Laterality: Right;  . TRIGGER FINGER RELEASE Right 02/21/2013   Procedure: RIGHT RING A-1 PULLEY RELEASE    (MINOR PROCEDURE) ;  Surgeon: Wyn Forsterobert V Sypher Jr., MD;  [facility];  Service: Orthopedics;  Laterality: Right;    There were no vitals filed for this visit.      Subjective Assessment - 09/16/16 0756    Subjective It is a different kind of pain than before, more localized vs. diffuse pain.  Getting out of swivel chair bothers my knee.  I still get tight at times.  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  My hip muscles were sore after last time.     Currently in Pain? Yes   Pain Score 2  Pain Location Knee   Pain Orientation Right   Pain Type Surgical pain   Aggravating Factors  getting up from an office chair (swivel chair)            [facility] PT Assessment - 09/16/16 0001      Observation/Other Assessments   Focus on Therapeutic Outcomes (FOTO)  49% limitation     AROM   Right Knee Extension 3   Right Knee Flexion 121     Strength   Right Knee Flexion 4-/5   Right Knee Extension 4-/5                     [facility] Adult PT Treatment/Exercise - 09/16/16 0001      Therapeutic Activites    Therapeutic Activities Other Therapeutic Activities   Other Therapeutic Activities sit to stand, walking, standing     Neuro Re-ed    Neuro Re-ed Details  quad muscle activation  long  discussion on physiology of healing, impact of surgery     Knee/Hip Exercises: Stretches   Other Knee/Hip Stretches supine with ball for flexion     Knee/Hip Exercises: Aerobic   Stationary Bike Level 1 x 8 minutes      Knee/Hip Exercises: Standing   Forward Step Up Right;10 reps   Step Down Right;5 reps   SLS with Vectors 3 ways 5x     Knee/Hip Exercises: Seated   Long Arc Quad Strengthening;Right;2 sets;10 reps;Weights   Long Arc Quad Weight 2 lbs.   Hamstring Curl Strengthening;Right;2 sets;10 reps   Hamstring Limitations red band   Sit to [employer] 15 reps     Knee/Hip Exercises: Supine   Other Supine Knee/Hip Exercises HS sets on ball 10x     Vasopneumatic   Number Minutes Vasopneumatic  15 minutes   Vasopnuematic Location  Knee   Vasopneumatic Pressure Medium   Vasopneumatic Temperature  3 flakes                  PT Short Term Goals - 09/16/16 0906      PT SHORT TERM GOAL #1   Title be independent in initial HEP   Status Achieved     PT SHORT TERM GOAL #2   Title demonstrate Rt knee A/ROM flexion to > or = to 105 degrees to improve squatting and sit to stand transition   Status Achieved     PT SHORT TERM GOAL #3   Title demonstrate symmetry with ambulation on level surface   Status Achieved     PT SHORT TERM GOAL #4   Title report < or = to 4/10 Rt knee pain with standing and walking   Status Achieved     PT SHORT TERM GOAL #5   Title improve endurance to stand and walk for > or = to 15-20 minutes to improve community function   Status Achieved           PT Long Term Goals - 09/16/16 0906      PT LONG TERM GOAL #1   Title be independent in advanced HEP   Time 8   Period Weeks   Status On-going     PT LONG TERM GOAL #2   Title reduce FOTO to < or = to 44% limitation   Time 8   Period Weeks   Status On-going     PT LONG TERM GOAL #3   Title The patient will be able to walk/stand for 1.5 hours needed for work  duties and grocery shopping    Time 8   Period Weeks   Status On-going     PT LONG TERM GOAL #4   Title report a 75% reduction in Rt knee pain with standing and walking   Time 8   Period Weeks   Status On-going     PT LONG TERM GOAL #5   Title demonstrate Rt knee A/ROM flexion to 115 degrees to improve squatting and descending steps   Status Achieved     PT LONG TERM GOAL #6   Title improve Rt knee strength to ascend and descend steps with step-over-step gait with use of 1 rail   Time 8   Period Weeks   Status On-going               Plan - 2016/09/17 0850    Clinical Impression Statement The patient has improved knee flexion and extension ROM.  Her FOTO functional outcome score has improved from 55% to 49%.  She reports continued functional limitations with sit to stand from a swivel chair and limited walking endurance.  She has the sensation of tightness periodically which contributes to her discomfort.  Decreased tightness and edema following vasocompression.     PT Frequency 2x / week   PT Duration 8 weeks   PT Treatment/Interventions ADLs/Self Care Home Management;Cryotherapy;Electrical Stimulation;Functional mobility training;Stair training;Gait training;Therapeutic activities;Therapeutic exercise;Neuromuscular re-education;Patient/family education;Passive range of motion;Scar mobilization;Manual techniques;Taping;Vasopneumatic Device   PT Next Visit Plan manual and modalities for AROM and edema management, strength, proprioception      Patient will benefit from skilled therapeutic intervention in order to improve the following deficits and impairments:  Abnormal gait, Decreased activity tolerance, Decreased strength, Increased edema, Impaired flexibility, Pain, Decreased endurance, Decreased range of motion, Difficulty walking  Visit Diagnosis: Muscle weakness (generalized)  Difficulty in walking, not elsewhere classified  Acute pain of right knee  Localized edema  Stiffness of right knee,  not elsewhere classified       G-Codes - 09-17-2016 0904    Functional Assessment Tool Used (Outpatient Only) FOTO   Functional Limitation Mobility: Walking and moving around   Mobility: Walking and Moving Around Current Status [phone]) At least 40 percent but less than 60 percent impaired, limited or restricted      Problem List Patient Active Problem List   Diagnosis Date Noted  . 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 Diabetes (HCC) 01/08/2014  . Essential hypertension 01/08/2014  . Hyperlipidemia 01/08/2014  . Postoperative anemia due to acute blood loss 11/22/2012  . Hyponatremia 11/06/2012  . OA (osteoarthritis) of knee 11/05/2012  . Villonodular synovitis of knee 04/20/2011   Lavinia Sharps, PT 2016-09-17 9:22 AM Phone: [phone] Fax: [phone]  Vivien Presto Sep 17, 2016, Doneen Poisson AM  [facility] [address], [address] [place], [place], [address] Phone: [phone]   Fax:  [phone]  Name: [name] MRN: [id number] Date of Birth: 08-May-1950

## 2016-09-16 NOTE — Therapy (Signed)
[facility] [address], [address] [place], [place], [address] Phone: [phone]   Fax:  [phone]  Physical Therapy Treatment  Patient Details  Name: [name] MRN: [id number] Date of Birth: 1951-02-03 Referring Provider: Doneen PoissonBlackman, Christopher, MD  Encounter Date: 09/16/2016      PT End of Session - 09/16/16 0849    Visit Number 10   Number of Visits 20   Date for PT Re-Evaluation 10/11/16   Authorization Type G-codes based on age   PT Start Time 0800   PT Stop Time 0903   PT Time Calculation (min) 63 min   Activity Tolerance Patient tolerated treatment well      Past Medical History:  Diagnosis Date  . Anemia    hx of  . Arthritis   . Depression   . Diabetes mellitus ORAL MEDS  . GERD (gastroesophageal reflux disease)    occasional  . History of kidney stones   . Hyperlipidemia   . Hypertension   . Hypothyroidism   . Knee pain, right   . 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 OSA on CPAP   . Osteoarthritis   . Pinched nerve in neck   . Pneumonia    hx of  . PONV (postoperative nausea and vomiting)    for 3-4 days after general anesthesia  . Swelling of knee joint, right   . Synovitis of knee RIGHT    Past Surgical History:  Procedure Laterality Date  . CESAREAN SECTION  X3  . KNEE ARTHROSCOPY  04/20/2011   Procedure: ARTHROSCOPY KNEE;  Surgeon: Loanne DrillingFrank V Aluisio, MD;  [facility];  Service: Orthopedics;  Laterality: Right;  WITH SYNOVECTOMY  . LEFT CARPAL TUNNEL / LEFT MIDDLE & RING FINGER TRIGGER RELEASE  08-26-2008  . LEFT SHOULDER ARTHROSCOPY W/ DEBRIDEMENT  09-09-2003  . LEFT SHOULDER ARTHROSCOPY/ LEFT THUMB TRIGGER RELEASE  02-22-2005  . PULLEY RELEASE LEFT LONG FINGER  07-14-2009  . RIGHT CARPAL TUNNEL/ RIGHT THUMB TRIGGER RELEASE'S  11-28-2006  . RIGHT SHOULDER ARTHROSCOPY W/ ROTATOR CUFF REPAIR  01-13-2004  . SHOULDER ARTHROSCOPY DISTAL CLAVICLE  EXCISION AND OPEN ROTATOR CUFF REPAIR  09-07-2004   LEFT  . SHOULDER ARTHROSCOPY W/ ACROMIAL REPAIR  11-29-2005   LEFT  . TONSILLECTOMY AND ADENOIDECTOMY  child  . TOTAL KNEE ARTHROPLASTY  12-14-2009   RIGHT  . TOTAL KNEE ARTHROPLASTY Left 11/05/2012   Procedure: LEFT TOTAL KNEE ARTHROPLASTY;  Surgeon: Loanne DrillingFrank V Aluisio, MD;  Location: [facility];  Service: Orthopedics;  Laterality: Left;  . TOTAL KNEE REVISION Right 07/29/2016   Procedure: RIGHT KNEE POLY-LINER EXCHANGE;  Surgeon: Kathryne HitchBlackman, Christopher Y, MD;  Location: [facility];  Service: Orthopedics;  Laterality: Right;  . TRIGGER FINGER RELEASE Right 02/21/2013   Procedure: RIGHT RING A-1 PULLEY RELEASE    (MINOR PROCEDURE) ;  Surgeon: Wyn Forsterobert V Sypher Jr., MD;  [facility];  Service: Orthopedics;  Laterality: Right;    There were no vitals filed for this visit.      Subjective Assessment - 09/16/16 0756    Subjective It is a different kind of pain than before, more localized vs. diffuse pain.  Getting out of swivel chair bothers my knee.  I still get tight at times.  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  My hip muscles were sore after last time.     Currently in Pain? Yes   Pain Score 2  Pain Location Knee   Pain Orientation Right   Pain Type Surgical pain   Aggravating Factors  getting up from an office chair (swivel chair)            [facility] PT Assessment - 09/16/16 0001      Observation/Other Assessments   Focus on Therapeutic Outcomes (FOTO)  49% limitation     AROM   Right Knee Extension 3   Right Knee Flexion 121                     [facility] Adult PT Treatment/Exercise - 09/16/16 0001      Therapeutic Activites    Therapeutic Activities Other Therapeutic Activities   Other Therapeutic Activities sit to stand, walking, standing     Neuro Re-ed    Neuro Re-ed Details  quad muscle activation  long discussion on physiology of healing, impact of surgery     Knee/Hip  Exercises: Stretches   Other Knee/Hip Stretches supine with ball for flexion     Knee/Hip Exercises: Aerobic   Stationary Bike Level 1 x 8 minutes      Knee/Hip Exercises: Standing   Forward Step Up Right;10 reps   Step Down Right;5 reps   SLS with Vectors 3 ways 5x     Knee/Hip Exercises: Seated   Long Arc Quad Strengthening;Right;2 sets;10 reps;Weights   Long Arc Quad Weight 2 lbs.   Hamstring Curl Strengthening;Right;2 sets;10 reps   Hamstring Limitations red band   Sit to [employer] 15 reps     Knee/Hip Exercises: Supine   Other Supine Knee/Hip Exercises HS sets on ball 10x     Vasopneumatic   Number Minutes Vasopneumatic  15 minutes   Vasopnuematic Location  Knee   Vasopneumatic Pressure Medium   Vasopneumatic Temperature  3 flakes                  PT Short Term Goals - 09/16/16 0906      PT SHORT TERM GOAL #1   Title be independent in initial HEP   Status Achieved     PT SHORT TERM GOAL #2   Title demonstrate Rt knee A/ROM flexion to > or = to 105 degrees to improve squatting and sit to stand transition   Status Achieved     PT SHORT TERM GOAL #3   Title demonstrate symmetry with ambulation on level surface   Status Achieved     PT SHORT TERM GOAL #4   Title report < or = to 4/10 Rt knee pain with standing and walking   Status Achieved     PT SHORT TERM GOAL #5   Title improve endurance to stand and walk for > or = to 15-20 minutes to improve community function   Status Achieved           PT Long Term Goals - 09/16/16 0906      PT LONG TERM GOAL #1   Title be independent in advanced HEP   Time 8   Period Weeks   Status On-going     PT LONG TERM GOAL #2   Title reduce FOTO to < or = to 44% limitation   Time 8   Period Weeks   Status On-going     PT LONG TERM GOAL #3   Title The patient will be able to walk/stand for 1.5 hours needed for work duties and grocery shopping   Time 8   Period Weeks   Status On-going  PT LONG TERM GOAL  #4   Title report a 75% reduction in Rt knee pain with standing and walking   Time 8   Period Weeks   Status On-going     PT LONG TERM GOAL #5   Title demonstrate Rt knee A/ROM flexion to 115 degrees to improve squatting and descending steps   Status Achieved     PT LONG TERM GOAL #6   Title improve Rt knee strength to ascend and descend steps with step-over-step gait with use of 1 rail   Time 8   Period Weeks   Status On-going               Plan - 09/16/16 0850    Clinical Impression Statement The patient has improved knee flexion and extension ROM.  Her FOTO functional outcome score has improved from 55% to 49%.  She reports continued functional limitations with sit to stand from a swivel chair and limited walking endurance.  She has the sensation of tightness periodically which contributes to her discomfort.  Decreased tightness and edema following vasocompression.     PT Frequency 2x / week   PT Duration 8 weeks   PT Treatment/Interventions ADLs/Self Care Home Management;Cryotherapy;Electrical Stimulation;Functional mobility training;Stair training;Gait training;Therapeutic activities;Therapeutic exercise;Neuromuscular re-education;Patient/family education;Passive range of motion;Scar mobilization;Manual techniques;Taping;Vasopneumatic Device   PT Next Visit Plan manual and modalities for AROM and edema management, strength, proprioception      Patient will benefit from skilled therapeutic intervention in order to improve the following deficits and impairments:  Abnormal gait, Decreased activity tolerance, Decreased strength, Increased edema, Impaired flexibility, Pain, Decreased endurance, Decreased range of motion, Difficulty walking  Visit Diagnosis: Muscle weakness (generalized)  Difficulty in walking, not elsewhere classified  Acute pain of right knee  Localized edema  Stiffness of right knee, not elsewhere classified       G-Codes - 09/16/16 0904     Functional Assessment Tool Used (Outpatient Only) FOTO   Functional Limitation Mobility: Walking and moving around   Mobility: Walking and Moving Around Current Status [phone]([id number]) At least 40 percent but less than 60 percent impaired, limited or restricted      Problem List Patient Active Problem List   Diagnosis Date Noted  . 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 Diabetes (HCC) 01/08/2014  . Essential hypertension 01/08/2014  . Hyperlipidemia 01/08/2014  . Postoperative anemia due to acute blood loss 11/22/2012  . Hyponatremia 11/06/2012  . OA (osteoarthritis) of knee 11/05/2012  . Villonodular synovitis of knee 04/20/2011    Vivien PrestoSimpson, Stacy C 09/16/2016, 9:20 AM  [facility] [address], [address] [place], [place], [address] Phone: [phone]   Fax:  [phone]  Name: [name] MRN: [id number] Date of Birth: 1950-10-29

## 2016-09-19 ENCOUNTER — Ambulatory Visit: Payer: 59

## 2016-09-19 DIAGNOSIS — M6281 Muscle weakness (generalized): Secondary | ICD-10-CM

## 2016-09-19 DIAGNOSIS — M25561 Pain in right knee: Secondary | ICD-10-CM | POA: Diagnosis not present

## 2016-09-19 DIAGNOSIS — R262 Difficulty in walking, not elsewhere classified: Secondary | ICD-10-CM | POA: Diagnosis not present

## 2016-09-19 DIAGNOSIS — R600 Localized edema: Secondary | ICD-10-CM | POA: Diagnosis not present

## 2016-09-19 DIAGNOSIS — M25661 Stiffness of right knee, not elsewhere classified: Secondary | ICD-10-CM | POA: Diagnosis not present

## 2016-09-19 NOTE — Therapy (Signed)
[facility] [address], [address] [place], [place], [address] Phone: [phone]   Fax:  [phone]  Physical Therapy Treatment  Patient Details  Name: Stefanie DeemBonnie D Lasure MRN: [id number] Date of Birth: 08-17-1950 Referring Provider: Doneen PoissonBlackman, Christopher, MD  Encounter Date: 09/19/2016      PT End of Session - 09/19/16 1052    Visit Number 11   Number of Visits 20   Date for PT Re-Evaluation 10/11/16   Authorization Type G-codes based on age   PT Start Time 1010   PT Stop Time 1110   PT Time Calculation (min) 60 min   Activity Tolerance Patient tolerated treatment well   Behavior During Therapy [facility] for tasks assessed/performed      Past Medical History:  Diagnosis Date  . Anemia    hx of  . Arthritis   . Depression   . Diabetes mellitus ORAL MEDS  . GERD (gastroesophageal reflux disease)    occasional  . History of kidney stones   . Hyperlipidemia   . Hypertension   . Hypothyroidism   . Knee pain, right   . 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 OSA on CPAP   . Osteoarthritis   . Pinched nerve in neck   . Pneumonia    hx of  . PONV (postoperative nausea and vomiting)    for 3-4 days after general anesthesia  . Swelling of knee joint, right   . Synovitis of knee RIGHT    Past Surgical History:  Procedure Laterality Date  . CESAREAN SECTION  X3  . KNEE ARTHROSCOPY  04/20/2011   Procedure: ARTHROSCOPY KNEE;  Surgeon: Loanne DrillingFrank V Aluisio, MD;  [facility];  Service: Orthopedics;  Laterality: Right;  WITH SYNOVECTOMY  . LEFT CARPAL TUNNEL / LEFT MIDDLE & RING FINGER TRIGGER RELEASE  08-26-2008  . LEFT SHOULDER ARTHROSCOPY W/ DEBRIDEMENT  09-09-2003  . LEFT SHOULDER ARTHROSCOPY/ LEFT THUMB TRIGGER RELEASE  02-22-2005  . PULLEY RELEASE LEFT LONG FINGER  07-14-2009  . RIGHT CARPAL TUNNEL/ RIGHT THUMB TRIGGER RELEASE'S  11-28-2006  . RIGHT SHOULDER ARTHROSCOPY W/ ROTATOR CUFF REPAIR   01-13-2004  . SHOULDER ARTHROSCOPY DISTAL CLAVICLE EXCISION AND OPEN ROTATOR CUFF REPAIR  09-07-2004   LEFT  . SHOULDER ARTHROSCOPY W/ ACROMIAL REPAIR  11-29-2005   LEFT  . TONSILLECTOMY AND ADENOIDECTOMY  child  . TOTAL KNEE ARTHROPLASTY  12-14-2009   RIGHT  . TOTAL KNEE ARTHROPLASTY Left 11/05/2012   Procedure: LEFT TOTAL KNEE ARTHROPLASTY;  Surgeon: Loanne DrillingFrank V Aluisio, MD;  Location: [facility];  Service: Orthopedics;  Laterality: Left;  . TOTAL KNEE REVISION Right 07/29/2016   Procedure: RIGHT KNEE POLY-LINER EXCHANGE;  Surgeon: Kathryne HitchBlackman, Christopher Y, MD;  Location: [facility];  Service: Orthopedics;  Laterality: Right;  . TRIGGER FINGER RELEASE Right 02/21/2013   Procedure: RIGHT RING A-1 PULLEY RELEASE    (MINOR PROCEDURE) ;  Surgeon: Wyn Forsterobert V Sypher Jr., MD;  [facility];  Service: Orthopedics;  Laterality: Right;    There were no vitals filed for this visit.      Subjective Assessment - 09/19/16 1018    Subjective I did a lot of walking over the weekend.     Pertinent History Rt TKA 12/2009, Lt TKA 2014   Currently in Pain? Yes   Pain Score 2    Pain Location Knee   Pain Orientation Right   Pain Descriptors / Indicators Aching;Tightness   Pain Type Surgical pain  Pain Onset 1 to 4 weeks ago   Pain Frequency Intermittent   Aggravating Factors  a lot of walking   Pain Relieving Factors ice, elevation, pain meds            [facility] PT Assessment - 09/19/16 0001      Assessment   Medical Diagnosis history of total Rt knee replacement, poly liner exchange     Observation/Other Assessments   Focus on Therapeutic Outcomes (FOTO)  49% limitation     AROM   Right Knee Extension 3   Right Knee Flexion 121     Strength   Right Knee Flexion 4-/5   Right Knee Extension 4-/5                     [facility] Adult PT Treatment/Exercise - 09/19/16 0001      Knee/Hip Exercises: Stretches   Other Knee/Hip Stretches supine with ball for flexion      Knee/Hip Exercises: Aerobic   Stationary Bike Level 1 x 8 minutes      Knee/Hip Exercises: Machines for Strengthening   Cybex Leg Press Bil. 45# x30  seat 6     Knee/Hip Exercises: Standing   Heel Raises Both;2 sets;20 reps   Forward Step Up Right;20 reps   Step Down Right;10 reps   Rocker Board 3 minutes   Rebounder 3 directions x 1 minute     Knee/Hip Exercises: Seated   Long Arc Quad Strengthening;Right;2 sets;10 reps;Weights   Long Arc Quad Weight 2 lbs.   Hamstring Curl Strengthening;Right;2 sets;10 reps   Hamstring Limitations red band   Sit to [employer] 15 reps     Vasopneumatic   Number Minutes Vasopneumatic  15 minutes   Vasopnuematic Location  Knee   Vasopneumatic Pressure Medium   Vasopneumatic Temperature  3 flakes                  PT Short Term Goals - 09/16/16 0906      PT SHORT TERM GOAL #1   Title be independent in initial HEP   Status Achieved     PT SHORT TERM GOAL #2   Title demonstrate Rt knee A/ROM flexion to > or = to 105 degrees to improve squatting and sit to stand transition   Status Achieved     PT SHORT TERM GOAL #3   Title demonstrate symmetry with ambulation on level surface   Status Achieved     PT SHORT TERM GOAL #4   Title report < or = to 4/10 Rt knee pain with standing and walking   Status Achieved     PT SHORT TERM GOAL #5   Title improve endurance to stand and walk for > or = to 15-20 minutes to improve community function   Status Achieved           PT Long Term Goals - 09/19/16 1022      PT LONG TERM GOAL #1   Title be independent in advanced HEP   Time 8   Period Weeks   Status On-going     PT LONG TERM GOAL #2   Title reduce FOTO to < or = to 44% limitation   Baseline 49%   Time 8   Period Weeks   Status On-going     PT LONG TERM GOAL #3   Title The patient will be able to walk/stand for 1.5 hours needed for work duties and grocery shopping   Baseline 45 minutes to  an hour   Time 8   Period Weeks    Status On-going     PT LONG TERM GOAL #5   Title demonstrate Rt knee A/ROM flexion to 115 degrees to improve squatting and descending steps   Status Achieved               Plan - 09/19/16 1026    Clinical Impression Statement Pt with improved Rt knee A/ROM and improved FOTO score.  Pt able to walk 45-60 minutes without rest.  Pt with difficulty with sit to stand due to functional strength deficits.  Pt with continued mild edema.  Pt will continue to benefit from skilled Pt for Rt knee strength, endurance and gait.     Rehab Potential Good   PT Frequency 2x / week   PT Duration 8 weeks   PT Treatment/Interventions ADLs/Self Care Home Management;Cryotherapy;Electrical Stimulation;Functional mobility training;Stair training;Gait training;Therapeutic activities;Therapeutic exercise;Neuromuscular re-education;Patient/family education;Passive range of motion;Scar mobilization;Manual techniques;Taping;Vasopneumatic Device   PT Next Visit Plan manual and modalities for AROM and edema management, strength, proprioception   Consulted and Agree with Plan of Care Patient      Patient will benefit from skilled therapeutic intervention in order to improve the following deficits and impairments:  Abnormal gait, Decreased activity tolerance, Decreased strength, Increased edema, Impaired flexibility, Pain, Decreased endurance, Decreased range of motion, Difficulty walking  Visit Diagnosis: Muscle weakness (generalized)  Difficulty in walking, not elsewhere classified  Acute pain of right knee  Localized edema  Stiffness of right knee, not elsewhere classified     Problem List Patient Active Problem List   Diagnosis Date Noted  . 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 Diabetes (HCC) 01/08/2014  . Essential hypertension 01/08/2014  .  Hyperlipidemia 01/08/2014  . Postoperative anemia due to acute blood loss 11/22/2012  . Hyponatremia 11/06/2012  . OA (osteoarthritis) of knee 11/05/2012  . Villonodular synovitis of knee 04/20/2011     Lorrene Reid, PT 09/19/16 10:54 AM  [facility] [address], [address] [place], [place], [address] Phone: [phone]   Fax:  [phone]  Name: KETURA SIREK MRN: [id number] Date of Birth: November 29, 1950

## 2016-09-20 ENCOUNTER — Other Ambulatory Visit (INDEPENDENT_AMBULATORY_CARE_PROVIDER_SITE_OTHER): Payer: Self-pay | Admitting: Physician Assistant

## 2016-09-20 ENCOUNTER — Other Ambulatory Visit: Payer: Self-pay | Admitting: Cardiovascular Disease

## 2016-09-20 ENCOUNTER — Ambulatory Visit (INDEPENDENT_AMBULATORY_CARE_PROVIDER_SITE_OTHER): Payer: 59 | Admitting: Orthopaedic Surgery

## 2016-09-20 DIAGNOSIS — Z96659 Presence of unspecified artificial knee joint: Secondary | ICD-10-CM

## 2016-09-20 DIAGNOSIS — T84068D Wear of articular bearing surface of other internal prosthetic joint, subsequent encounter: Secondary | ICD-10-CM

## 2016-09-20 DIAGNOSIS — T84062D Wear of articular bearing surface of internal prosthetic right knee joint, subsequent encounter: Secondary | ICD-10-CM

## 2016-09-20 DIAGNOSIS — M65322 Trigger finger, left index finger: Secondary | ICD-10-CM

## 2016-09-20 MED FILL — INDOMETHACIN 50 MG CAPSULE: 50 | 6 days supply | Qty: 20 | Fill #1

## 2016-09-20 MED FILL — ATORVASTATIN 40 MG TABLET: 40 | 90 days supply | Qty: 90 | Fill #1

## 2016-09-20 MED FILL — VICTOZA 18 MG/3 ML INJECT P: 18 | 30 days supply | Qty: 9 | Fill #9

## 2016-09-20 NOTE — Progress Notes (Signed)
[name] is now 7 weeks status post a polyethylene liner exchange of her right knee with upsizing the poly-. This is giving her more stability. She is walking without assistive device any strength and range of motion are improving. She said return to work August 1. We are actually also seeing her for index finger triggering of her left index finger. We've injected this area before. We have her scheduled this week for an A1 pulley release of her left index finger. She's had trigger finger releases before on other digits of both hands.  On examination of her right knee feels ligamentously stable. Incisions well-healed with excellent range of motion. Examination of her left hand shows significant pain over the A1 pulley with active triggering. There is definitely hypertrophy of the A1 pulley.  At this point we'll proceed with surgery this Thursday for a A1 pulley release. We'll see her back in 2 weeks after that for suture removal. She is cleared to return to work August 1.

## 2016-09-21 ENCOUNTER — Ambulatory Visit: Payer: 59

## 2016-09-21 ENCOUNTER — Encounter (HOSPITAL_COMMUNITY): Payer: Self-pay | Admitting: *Deleted

## 2016-09-21 DIAGNOSIS — R262 Difficulty in walking, not elsewhere classified: Secondary | ICD-10-CM

## 2016-09-21 DIAGNOSIS — R600 Localized edema: Secondary | ICD-10-CM | POA: Diagnosis not present

## 2016-09-21 DIAGNOSIS — M25561 Pain in right knee: Secondary | ICD-10-CM | POA: Diagnosis not present

## 2016-09-21 DIAGNOSIS — M25661 Stiffness of right knee, not elsewhere classified: Secondary | ICD-10-CM | POA: Diagnosis not present

## 2016-09-21 DIAGNOSIS — M6281 Muscle weakness (generalized): Secondary | ICD-10-CM

## 2016-09-21 MED ORDER — CEFAZOLIN SODIUM-DEXTROSE 2-4 GM/100ML-% IV SOLN
2.0000 g | INTRAVENOUS | Status: AC
  Administered 2016-09-22: 2 g via INTRAVENOUS
  Filled 2016-09-21: qty 100

## 2016-09-21 MED FILL — XARELTO 20 MG TABLET: 20 | 30 days supply | Qty: 30 | Fill #0

## 2016-09-21 MED FILL — HYDROCODON-APAP 5-325: 5-325 | 6 days supply | Qty: 40 | Fill #0

## 2016-09-21 NOTE — Anesthesia Preprocedure Evaluation (Addendum)
Anesthesia Evaluation  Patient identified by MRN, date of birth, ID band Patient awake    Reviewed: Allergy & Precautions, NPO status , Patient's Chart, lab work & pertinent test results  History of Anesthesia Complications (+) PONV and history of anesthetic complications  Airway Mallampati: II  TM Distance: >3 FB Neck ROM: Full    Dental  (+) Teeth Intact, Dental Advisory Given, Caps,    Pulmonary sleep apnea , former smoker,    Pulmonary exam normal breath sounds clear to auscultation       Cardiovascular hypertension, Normal cardiovascular exam Rhythm:Regular Rate:Normal     Neuro/Psych PSYCHIATRIC DISORDERS Depression Left arm numbness     GI/Hepatic Neg liver ROS, GERD  ,  Endo/Other  diabetes, Type 2, Oral Hypoglycemic AgentsHypothyroidism Morbid obesity  Renal/GU Renal InsufficiencyRenal disease     Musculoskeletal  (+) Arthritis , Osteoarthritis,    Abdominal   Peds  Hematology  (+) Blood dyscrasia, anemia , Plt 293k on 07/20/16   Anesthesia Other Findings ECHO 18  - Normal LV size with mild LV hypertrophy. EF 65-70%. Normal RV   size and systolic function. No significant valvular   abnormalities.   Reproductive/Obstetrics                             Anesthesia Physical  Anesthesia Plan  ASA: III  Anesthesia Plan: MAC   Post-op Pain Management:  Regional for Post-op pain   Induction: Intravenous  PONV Risk Score and Plan: 3 and Ondansetron, Dexamethasone, Propofol, Midazolam and Treatment may vary due to age or medical condition  Airway Management Planned: Nasal Cannula and Natural Airway  Additional Equipment:   Intra-op Plan:   Post-operative Plan: Extubation in OR  Informed Consent: I have reviewed the patients History and Physical, chart, labs and discussed the procedure including the risks, benefits and alternatives for the proposed anesthesia with the patient  or authorized representative who has indicated his/her understanding and acceptance.   Dental advisory given  Plan Discussed with: CRNA, Anesthesiologist and Surgeon  Anesthesia Plan Comments: (Risks/benefits of general anesthesia discussed with patient including risk of damage to teeth, lips, gum, and tongue, nausea/vomiting, allergic reactions to medications, and the possibility of heart attack, stroke and death.  All patient questions answered.  Patient wishes to proceed.)       Anesthesia Quick Evaluation

## 2016-09-21 NOTE — Telephone Encounter (Signed)
Refill request received for Xarelto 20mg ; pt is 66 yrs old, Crea-1.18 on 07/20/16, Wt-117kg on 09/12/16, [facility], CrCl-85.5545ml/min; will send in refill request as requested.

## 2016-09-21 NOTE — Progress Notes (Signed)
Pt denies SOB and chest pain but is under the care of Dr. Eden EmmsNishan, Cardiology. Pt denies having a cardiac cath. Pt denies recent labs. Pt stated that she does not take Aspirin but her last dose of Xarelto was Monday evening. Pt made aware to stop taking vitamins, fish oil, Melatonin and herbal medications. Do not take any NSAIDs ie: Ibuprofen, Advil, Naproxen, Indocin, Motrin, Aleve, BC and Goody Powder or any medication containing Aspirin. Pt made aware to take half dose of Lantus tonight ( 30 units instead of 60). Pt stated that her fasting blood glucose usually ranges between 90-120. Pt made aware to check BG every 2 hours prior to arrival to hospital on DOS. Pt made aware to take 30 units of Lantus the morning of surgery unless blood glucose is <70.  Pt made aware to treat a BG < 70 with 4 ounces of apple  juice, wait 15 minutes after intervention to recheck BG, if BG remains < 70, call Short Stay unit to speak with a nurse.Pt verbalized understanding of all pre-op instructions.

## 2016-09-21 NOTE — Therapy (Signed)
[facility] [address] [place], [place], [address] Phone: [phone]   Fax:  [phone]  Physical Therapy Treatment  Patient Details  Name: [name] MRN: [id number] Date of Birth: 04-04-1950 Referring Provider: Doneen Poisson, MD  Encounter Date: 09/21/2016      PT End of Session - 09/21/16 0950    Visit Number 12   Number of Visits 20   Date for PT Re-Evaluation 10/11/16   PT Start Time 0930   PT Stop Time 1026   PT Time Calculation (min) 56 min   Activity Tolerance Patient tolerated treatment well   Behavior During Therapy [facility] for tasks assessed/performed      Past Medical History:  Diagnosis Date  . Anemia    hx of  . Arthritis   . Depression   . Diabetes mellitus ORAL MEDS  . GERD (gastroesophageal reflux disease)    occasional  . History of kidney stones   . Hyperlipidemia   . Hypertension   . Hypothyroidism   . Knee pain, right   . 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 OSA on CPAP   . Osteoarthritis   . Pinched nerve in neck   . Pneumonia    hx of  . PONV (postoperative nausea and vomiting)    for 3-4 days after general anesthesia  . Swelling of knee joint, right   . Synovitis of knee RIGHT    Past Surgical History:  Procedure Laterality Date  . CESAREAN SECTION  X3  . KNEE ARTHROSCOPY  04/20/2011   Procedure: ARTHROSCOPY KNEE;  Surgeon: Loanne Drilling, MD;  Location: [facility];  Service: Orthopedics;  Laterality: Right;  WITH SYNOVECTOMY  . LEFT CARPAL TUNNEL / LEFT MIDDLE & RING FINGER TRIGGER RELEASE  08-26-2008  . LEFT SHOULDER ARTHROSCOPY W/ DEBRIDEMENT  09-09-2003  . LEFT SHOULDER ARTHROSCOPY/ LEFT THUMB TRIGGER RELEASE  02-22-2005  . PULLEY RELEASE LEFT LONG FINGER  07-14-2009  . RIGHT CARPAL TUNNEL/ RIGHT THUMB TRIGGER RELEASE'S  11-28-2006  . RIGHT SHOULDER ARTHROSCOPY W/ ROTATOR CUFF REPAIR  01-13-2004  . SHOULDER ARTHROSCOPY  DISTAL CLAVICLE EXCISION AND OPEN ROTATOR CUFF REPAIR  09-07-2004   LEFT  . SHOULDER ARTHROSCOPY W/ ACROMIAL REPAIR  11-29-2005   LEFT  . TONSILLECTOMY AND ADENOIDECTOMY  child  . TOTAL KNEE ARTHROPLASTY  12-14-2009   RIGHT  . TOTAL KNEE ARTHROPLASTY Left 11/05/2012   Procedure: LEFT TOTAL KNEE ARTHROPLASTY;  Surgeon: Loanne Drilling, MD;  Location: [facility];  Service: Orthopedics;  Laterality: Left;  . TOTAL KNEE REVISION Right 07/29/2016   Procedure: RIGHT KNEE POLY-LINER EXCHANGE;  Surgeon: Kathryne Hitch, MD;  Location: [facility];  Service: Orthopedics;  Laterality: Right;  . TRIGGER FINGER RELEASE Right 02/21/2013   Procedure: RIGHT RING A-1 PULLEY RELEASE    (MINOR PROCEDURE) ;  Surgeon: Wyn Forster., MD;  Location: [facility];  Service: Orthopedics;  Laterality: Right;    There were no vitals filed for this visit.      Subjective Assessment - 09/21/16 0940    Subjective Pt saw MD yesterday.  He was pleased with progress.  Will have trigger point release tomorrow.     Patient Stated Goals return to work, normalize gait, reduce pain, improve A/ROM and strength   Currently in Pain? Yes   Pain Score 1    Pain Location Knee   Pain Orientation Right   Pain Descriptors / Indicators Aching;Tightness  [facility] Adult PT Treatment/Exercise - 09/21/16 0001      Knee/Hip Exercises: Stretches   Other Knee/Hip Stretches supine with ball for flexion     Knee/Hip Exercises: Aerobic   Nustep Level 1 x 10 minutes     Knee/Hip Exercises: Machines for Strengthening   Cybex Leg Press Bil. 45# x30, Rt only 30# 3x10  seat 6     Knee/Hip Exercises: Standing   Heel Raises Both;2 sets;20 reps   Rocker Board 3 minutes   Rebounder 3 directions x 1 minute   Walking with Sports Cord 20# forward and reverse x 10 minutes     Knee/Hip Exercises: Seated   Long Arc Quad Strengthening;Right;2 sets;10 reps;Weights   Long Arc Quad Weight 2  lbs.   Hamstring Curl Strengthening;Right;2 sets;10 reps   Hamstring Limitations red band     Vasopneumatic   Number Minutes Vasopneumatic  15 minutes   Vasopnuematic Location  Knee   Vasopneumatic Pressure Medium   Vasopneumatic Temperature  3 flakes                  PT Short Term Goals - 09/16/16 0906      PT SHORT TERM GOAL #1   Title be independent in initial HEP   Status Achieved     PT SHORT TERM GOAL #2   Title demonstrate Rt knee A/ROM flexion to > or = to 105 degrees to improve squatting and sit to stand transition   Status Achieved     PT SHORT TERM GOAL #3   Title demonstrate symmetry with ambulation on level surface   Status Achieved     PT SHORT TERM GOAL #4   Title report < or = to 4/10 Rt knee pain with standing and walking   Status Achieved     PT SHORT TERM GOAL #5   Title improve endurance to stand and walk for > or = to 15-20 minutes to improve community function   Status Achieved           PT Long Term Goals - 09/19/16 1022      PT LONG TERM GOAL #1   Title be independent in advanced HEP   Time 8   Period Weeks   Status On-going     PT LONG TERM GOAL #2   Title reduce FOTO to < or = to 44% limitation   Baseline 49%   Time 8   Period Weeks   Status On-going     PT LONG TERM GOAL #3   Title The patient will be able to walk/stand for 1.5 hours needed for work duties and grocery shopping   Baseline 45 minutes to an hour   Time 8   Period Weeks   Status On-going     PT LONG TERM GOAL #5   Title demonstrate Rt knee A/ROM flexion to 115 degrees to improve squatting and descending steps   Status Achieved               Plan - 09/21/16 0944    Clinical Impression Statement Pt with improved Rt knee A/ROM and improved FOTO score.  Pt able to walk 45-60 minutes without rest.  Pt with continued mild edema.  Pt tolerated addition of leg press and resisted walking today.  Pt will continue to benefit from skilled PT for Rt knee  strength, endurance and gait.     Rehab Potential Good   PT Frequency 2x / week   PT Duration 8 weeks  PT Treatment/Interventions ADLs/Self Care Home Management;Cryotherapy;Electrical Stimulation;Functional mobility training;Stair training;Gait training;Therapeutic activities;Therapeutic exercise;Neuromuscular re-education;Patient/family education;Passive range of motion;Scar mobilization;Manual techniques;Taping;Vasopneumatic Device   PT Next Visit Plan manual and modalities for AROM and edema management, strength, proprioception   Consulted and Agree with Plan of Care Patient      Patient will benefit from skilled therapeutic intervention in order to improve the following deficits and impairments:  Abnormal gait, Decreased activity tolerance, Decreased strength, Increased edema, Impaired flexibility, Pain, Decreased endurance, Decreased range of motion, Difficulty walking  Visit Diagnosis: Muscle weakness (generalized)  Difficulty in walking, not elsewhere classified  Acute pain of right knee  Localized edema  Stiffness of right knee, not elsewhere classified     Problem List Patient Active Problem List   Diagnosis Date Noted  . 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 Diabetes (HCC) 01/08/2014  . Essential hypertension 01/08/2014  . Hyperlipidemia 01/08/2014  . Postoperative anemia due to acute blood loss 11/22/2012  . Hyponatremia 11/06/2012  . OA (osteoarthritis) of knee 11/05/2012  . Villonodular synovitis of knee 04/20/2011    Lorrene Reid, PT 09/21/16 10:13 AM  [facility] [address], [address] [place], [place], [address] Phone: [phone]   Fax:  [phone]  Name: [name] MRN: [id number] Date of Birth: [birth year]

## 2016-09-22 ENCOUNTER — Encounter (HOSPITAL_COMMUNITY): Payer: Self-pay | Admitting: *Deleted

## 2016-09-22 ENCOUNTER — Ambulatory Visit (HOSPITAL_COMMUNITY): Payer: 59 | Admitting: Anesthesiology

## 2016-09-22 ENCOUNTER — Ambulatory Visit (HOSPITAL_COMMUNITY)
Admission: RE | Admit: 2016-09-22 | Discharge: 2016-09-22 | Disposition: A | Discharge status: Home / Self Care | Payer: 59 | Source: Ambulatory Visit | Attending: Orthopaedic Surgery | Admitting: Orthopaedic Surgery

## 2016-09-22 ENCOUNTER — Encounter (HOSPITAL_COMMUNITY)
Admission: RE | Disposition: A | Discharge status: Home / Self Care | Payer: Self-pay | Source: Ambulatory Visit | Attending: Orthopaedic Surgery

## 2016-09-22 DIAGNOSIS — Z87891 Personal history of nicotine dependence: Secondary | ICD-10-CM | POA: Diagnosis not present

## 2016-09-22 DIAGNOSIS — I10 Essential (primary) hypertension: Secondary | ICD-10-CM | POA: Diagnosis not present

## 2016-09-22 DIAGNOSIS — E119 Type 2 diabetes mellitus without complications: Secondary | ICD-10-CM | POA: Insufficient documentation

## 2016-09-22 DIAGNOSIS — Z794 Long term (current) use of insulin: Secondary | ICD-10-CM | POA: Insufficient documentation

## 2016-09-22 DIAGNOSIS — Z6841 Body Mass Index (BMI) 40.0 and over, adult: Secondary | ICD-10-CM | POA: Insufficient documentation

## 2016-09-22 DIAGNOSIS — K219 Gastro-esophageal reflux disease without esophagitis: Secondary | ICD-10-CM | POA: Insufficient documentation

## 2016-09-22 DIAGNOSIS — E785 Hyperlipidemia, unspecified: Secondary | ICD-10-CM | POA: Insufficient documentation

## 2016-09-22 DIAGNOSIS — Z96653 Presence of artificial knee joint, bilateral: Secondary | ICD-10-CM | POA: Insufficient documentation

## 2016-09-22 DIAGNOSIS — M65322 Trigger finger, left index finger: Secondary | ICD-10-CM | POA: Insufficient documentation

## 2016-09-22 DIAGNOSIS — F329 Major depressive disorder, single episode, unspecified: Secondary | ICD-10-CM | POA: Insufficient documentation

## 2016-09-22 DIAGNOSIS — Z79899 Other long term (current) drug therapy: Secondary | ICD-10-CM | POA: Insufficient documentation

## 2016-09-22 DIAGNOSIS — G4733 Obstructive sleep apnea (adult) (pediatric): Secondary | ICD-10-CM | POA: Insufficient documentation

## 2016-09-22 DIAGNOSIS — E039 Hypothyroidism, unspecified: Secondary | ICD-10-CM | POA: Diagnosis not present

## 2016-09-22 HISTORY — DX: Trigger finger, unspecified finger: M65.30

## 2016-09-22 HISTORY — PX: TRIGGER FINGER RELEASE: SHX641

## 2016-09-22 LAB — CBC
HCT: 39.2 % (ref 36.0–46.0)
Hemoglobin: 12.3 g/dL (ref 12.0–15.0)
MCH: 28.5 pg (ref 26.0–34.0)
MCHC: 31.4 g/dL (ref 30.0–36.0)
MCV: 91 fL (ref 78.0–100.0)
Platelets: 284 10*3/uL (ref 150–400)
RBC: 4.31 MIL/uL (ref 3.87–5.11)
RDW: 14.9 % (ref 11.5–15.5)
WBC: 8.6 10*3/uL (ref 4.0–10.5)

## 2016-09-22 LAB — PROTIME-INR
INR: 1.06
PROTHROMBIN TIME: 13.8 s (ref 11.4–15.2)

## 2016-09-22 LAB — BASIC METABOLIC PANEL
ANION GAP: 8 (ref 5–15)
BUN: 20 mg/dL (ref 6–20)
CHLORIDE: 105 mmol/L (ref 101–111)
CO2: 26 mmol/L (ref 22–32)
Calcium: 9.1 mg/dL (ref 8.9–10.3)
Creatinine, Ser: 1.1 mg/dL — ABNORMAL HIGH (ref 0.44–1.00)
GFR calc Af Amer: 59 mL/min — ABNORMAL LOW (ref >60)
GFR calc non Af Amer: 51 mL/min — ABNORMAL LOW (ref >60)
GLUCOSE: 91 mg/dL (ref 65–99)
POTASSIUM: 3.7 mmol/L (ref 3.5–5.1)
Sodium: 139 mmol/L (ref 135–145)

## 2016-09-22 LAB — GLUCOSE, CAPILLARY: GLUCOSE-CAPILLARY: 96 mg/dL (ref 65–99)

## 2016-09-22 SURGERY — RELEASE, A1 PULLEY, FOR TRIGGER FINGER
Anesthesia: Monitor Anesthesia Care | Laterality: Left

## 2016-09-22 MED ORDER — MIDAZOLAM HCL 2 MG/2ML IJ SOLN
INTRAMUSCULAR | Status: AC
  Filled 2016-09-22: qty 2

## 2016-09-22 MED ORDER — BUPIVACAINE HCL 0.25 % IJ SOLN
INTRAMUSCULAR | Status: DC | PRN
  Administered 2016-09-22: 7 mL via INTRA_ARTICULAR

## 2016-09-22 MED ORDER — BUPIVACAINE HCL (PF) 0.25 % IJ SOLN
INTRAMUSCULAR | Status: AC
  Filled 2016-09-22: qty 30

## 2016-09-22 MED ORDER — CHLORHEXIDINE GLUCONATE 4 % EX LIQD: 60.0000 mL | Freq: Once | CUTANEOUS | Status: DC

## 2016-09-22 MED ORDER — FENTANYL CITRATE (PF) 250 MCG/5ML IJ SOLN
INTRAMUSCULAR | Status: AC
  Filled 2016-09-22: qty 5

## 2016-09-22 MED ORDER — LACTATED RINGERS IV SOLN
INTRAVENOUS | Status: DC
  Administered 2016-09-22: 09:00:00 via INTRAVENOUS

## 2016-09-22 MED ORDER — PROPOFOL 10 MG/ML IV BOLUS
INTRAVENOUS | Status: AC
  Filled 2016-09-22: qty 20

## 2016-09-22 MED ORDER — 0.9 % SODIUM CHLORIDE (POUR BTL) OPTIME
TOPICAL | Status: DC | PRN
  Administered 2016-09-22: 1000 mL

## 2016-09-22 SURGICAL SUPPLY — 35 items
BANDAGE ACE 3X5.8 VEL STRL LF (GAUZE/BANDAGES/DRESSINGS) ×2 IMPLANT
BANDAGE ACE 4X5 VEL STRL LF (GAUZE/BANDAGES/DRESSINGS) IMPLANT
BNDG CONFORM 3 STRL LF (GAUZE/BANDAGES/DRESSINGS) ×2 IMPLANT
BNDG GAUZE ELAST 4 BULKY (GAUZE/BANDAGES/DRESSINGS) IMPLANT
CORDS BIPOLAR (ELECTRODE) ×2 IMPLANT
COVER SURGICAL LIGHT HANDLE (MISCELLANEOUS) ×2 IMPLANT
CUFF TOURNIQUET SINGLE 18IN (TOURNIQUET CUFF) IMPLANT
CUFF TOURNIQUET SINGLE 24IN (TOURNIQUET CUFF) IMPLANT
DRAPE SURG 17X23 STRL (DRAPES) ×2 IMPLANT
DURAPREP 26ML APPLICATOR (WOUND CARE) ×2 IMPLANT
GAUZE SPONGE 4X4 12PLY STRL (GAUZE/BANDAGES/DRESSINGS) ×2 IMPLANT
GAUZE XEROFORM 1X8 LF (GAUZE/BANDAGES/DRESSINGS) ×2 IMPLANT
GLOVE BIO SURGEON STRL SZ8 (GLOVE) ×2 IMPLANT
GLOVE ORTHO TXT STRL SZ7.5 (GLOVE) ×2 IMPLANT
GOWN STRL REUS W/ TWL LRG LVL3 (GOWN DISPOSABLE) ×1 IMPLANT
GOWN STRL REUS W/ TWL XL LVL3 (GOWN DISPOSABLE) ×2 IMPLANT
GOWN STRL REUS W/TWL LRG LVL3 (GOWN DISPOSABLE) ×1
GOWN STRL REUS W/TWL XL LVL3 (GOWN DISPOSABLE) ×2
KIT BASIN OR (CUSTOM PROCEDURE TRAY) ×2 IMPLANT
KIT ROOM TURNOVER OR (KITS) ×2 IMPLANT
NEEDLE HYPO 25GX1X1/2 BEV (NEEDLE) ×2 IMPLANT
NS IRRIG 1000ML POUR BTL (IV SOLUTION) ×2 IMPLANT
PACK ORTHO EXTREMITY (CUSTOM PROCEDURE TRAY) ×2 IMPLANT
PAD ARMBOARD 7.5X6 YLW CONV (MISCELLANEOUS) ×4 IMPLANT
PAD CAST 4YDX4 CTTN HI CHSV (CAST SUPPLIES) IMPLANT
PADDING CAST COTTON 4X4 STRL (CAST SUPPLIES)
SUCTION FRAZIER HANDLE 10FR (MISCELLANEOUS) ×1
SUCTION TUBE FRAZIER 10FR DISP (MISCELLANEOUS) ×1 IMPLANT
SUT ETHILON 3 0 PS 1 (SUTURE) ×2 IMPLANT
SUT ETHILON 4 0 PS 2 18 (SUTURE) ×2 IMPLANT
SYR CONTROL 10ML LL (SYRINGE) ×2 IMPLANT
TOWEL OR 17X24 6PK STRL BLUE (TOWEL DISPOSABLE) ×2 IMPLANT
TOWEL OR 17X26 10 PK STRL BLUE (TOWEL DISPOSABLE) ×2 IMPLANT
TUBE CONNECTING 12X1/4 (SUCTIONS) ×2 IMPLANT
UNDERPAD 30X30 (UNDERPADS AND DIAPERS) ×2 IMPLANT

## 2016-09-22 NOTE — Anesthesia Postprocedure Evaluation (Signed)
Anesthesia Post Note  Patient: [name]  Procedure(s) Performed: Procedure(s) (LRB): RELEASE TRIGGER FINGER LEFT INDEX FINGER (Left)     Patient location during evaluation: PACU Anesthesia Type: MAC Level of consciousness: awake and alert Pain management: pain level controlled Vital Signs Assessment: post-procedure vital signs reviewed and stable Respiratory status: spontaneous breathing, nonlabored ventilation, respiratory function stable and patient connected to nasal cannula oxygen Cardiovascular status: stable and blood pressure returned to baseline Anesthetic complications: no    Last Vitals:  Vitals:   09/22/16 0950 09/22/16 1015  BP: (!) 167/77 (!) 179/78  Pulse: 72 79  Resp: 18 18  Temp: 36.7 C     Last Pain:  Vitals:   09/22/16 0736  TempSrc:   PainSc: 2                  Lyndia Bury

## 2016-09-22 NOTE — Op Note (Signed)
NAME:  [name], [name]                      ACCOUNT NO.:  MEDICAL RECORD NO.:  [id number]  LOCATION:                                 FACILITY:  PHYSICIAN:  Vanita PandaChristopher Y. Magnus IvanBlackman, M.D.DATE OF BIRTH:  April 25, 1950  DATE OF PROCEDURE:  09/22/2016 DATE OF DISCHARGE:                              OPERATIVE REPORT   PREOPERATIVE DIAGNOSIS:  Recurrent trigger finger, left hand, index finger.  POSTOPERATIVE DIAGNOSIS:  Recurrent trigger finger, left hand, index finger.  PROCEDURE:  Left index finger A1 pulley release.  SURGEON:  Vanita PandaChristopher Y. Magnus IvanBlackman, [name]  ASSISTANT:  Richardean CanalGilbert Clark, P.A.  ANESTHESIA:  Local with 0.25% plain Marcaine.  BLOOD LOSS:  Minimal.  COMPLICATIONS:  None.  INDICATIONS:  [name] is a 66 year old female with active triggering of her left index finger.  She has had multiple trigger fingers before and ended up with multiple releases at different times.  We still tried to treat this one conservatively with steroid injections.  She has had still pain over the A1 pulley and active triggering.  She now presents for an A1 pulley release.  Having had this done before, she fully understands the risks and benefits of the surgery and we had a thorough discussion about this.  PROCEDURE DESCRIPTION:  After informed consent was obtained, appropriate left wrist and hand were marked.  She was brought to the operating room, placed supine on the operating room table with the left arm on an arm table.  An IV line was already placed.  We prepped the left hand with DuraPrep and sterile drapes.  A time-out was called and she was identified as correct patient and correct left hand.  We then wrapped out the hand with an Esmarch as a local tourniquet around the wrist. Time-out was called.  She was again identified as correct patient and correct left hand, index finger.  We then anesthetized the A1 pulley area with 0.25% plain Marcaine.  We then made incision in the palm directly  over the A1 pulley and dissected down the 1 pulley.  We were able to have her finger moved back and forth and watch active triggering.  We then released the A1 pulley and removed some hypertrophic tissue as well.  We then had her flex and extend her finger and that moved as a unit and there was no bowstringing.  There was no triggering as well.  We then irrigated the soft tissue with normal saline solution and closed this incision with interrupted nylon suture.  Xeroform and well-padded sterile dressing were applied.  She was taken to the recovery room in stable condition.  All final counts were correct.  There were no complications noted.     Vanita Pandahristopher Y. Magnus IvanBlackman, [name]     CYB/MEDQ  D:  09/22/2016  T:  09/22/2016  Job:  [id number]

## 2016-09-22 NOTE — Brief Op Note (Signed)
09/22/2016  9:55 AM  PATIENT:  [name]  66 y.o. female  PRE-OPERATIVE DIAGNOSIS:  left index finger trigger finger  POST-OPERATIVE DIAGNOSIS:  left index finger trigger finger  PROCEDURE:  Procedure(s): RELEASE TRIGGER FINGER LEFT INDEX FINGER (Left)  SURGEON:  Surgeon(s) and Role:    Kathryne Hitch* Naveah Brave Y, MD - Primary  PHYSICIAN ASSISTANT: Rexene EdisonGil Clark, PA-C  ANESTHESIA:   local  EBL:  Total I/O In: 200 [Blood:200] Out: 2 [Blood:2]  COUNTS:  YES  DICTATION: .Other Dictation: Dictation Number [id number]  PLAN OF CARE: Discharge to home after PACU  PATIENT DISPOSITION:  PACU - hemodynamically stable.   Delay start of Pharmacological VTE agent (>24hrs) due to surgical blood loss or risk of bleeding: no

## 2016-09-22 NOTE — Transfer of Care (Signed)
Immediate Anesthesia Transfer of Care Note  Patient: [name]  Procedure(s) Performed: Procedure(s): RELEASE TRIGGER FINGER LEFT INDEX FINGER (Left)  Patient Location: PACU  Anesthesia Type:MAC  Level of Consciousness: awake, alert  and oriented  Airway & Oxygen Therapy: Patient Spontanous Breathing  Post-op Assessment: Report given to RN and Post -op Vital signs reviewed and stable  Post vital signs: Reviewed and stable  Last Vitals:  Vitals:   09/22/16 0725  BP: (!) 215/78  Pulse: 71  Resp: 20  Temp: 36.7 C    Last Pain:  Vitals:   09/22/16 0736  TempSrc:   PainSc: 2       Patients Stated Pain Goal: 5 (27/51/70 0174)  Complications: No apparent anesthesia complications

## 2016-09-22 NOTE — Discharge Instructions (Signed)
Increase activities as comfort allows. Keep your dressing clean and dry for the next 2-4 days. Once you remove your dressing, place a band-aid over your incision daily after each shower.

## 2016-09-22 NOTE — H&P (Signed)
[name] is an 66 y.o. female.   Chief Complaint: Left index finger triggering HPI:   66 yo female with known left index finger trigger finger.  Has failed conservative treatment.  Has a history of other trigger fingers that needed surgery as well.  She wishes to proceed with a release at this point for her left index finger given her pain and continued triggering and the failure of steroid injections.  Past Medical History:  Diagnosis Date  . Anemia    hx of  . Arthritis   . Depression   . Diabetes mellitus ORAL MEDS  . GERD (gastroesophageal reflux disease)    occasional  . History of kidney stones   . Hyperlipidemia   . Hypertension   . Hypothyroidism   . Knee pain, right   . 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 OSA on CPAP   . Osteoarthritis   . Pinched nerve in neck   . Pneumonia    hx of  . PONV (postoperative nausea and vomiting)    for 3-4 days after general anesthesia  . Swelling of knee joint, right   . Synovitis of knee RIGHT  . Trigger finger, left    left index    Past Surgical History:  Procedure Laterality Date  . CESAREAN SECTION  X3  . KNEE ARTHROSCOPY  04/20/2011   Procedure: ARTHROSCOPY KNEE;  Surgeon: Gearlean Alf, MD;  Location: [facility];  Service: Orthopedics;  Laterality: Right;  WITH SYNOVECTOMY  . LEFT CARPAL TUNNEL / LEFT MIDDLE & RING FINGER TRIGGER RELEASE  08-26-2008  . LEFT SHOULDER ARTHROSCOPY W/ DEBRIDEMENT  09-09-2003  . LEFT SHOULDER ARTHROSCOPY/ LEFT THUMB TRIGGER RELEASE  02-22-2005  . PULLEY RELEASE LEFT LONG FINGER  07-14-2009  . RIGHT CARPAL TUNNEL/ RIGHT THUMB TRIGGER RELEASE'S  11-28-2006  . RIGHT SHOULDER ARTHROSCOPY W/ ROTATOR CUFF REPAIR  01-13-2004  . SHOULDER ARTHROSCOPY DISTAL CLAVICLE EXCISION AND OPEN ROTATOR CUFF REPAIR  09-07-2004   LEFT  . SHOULDER ARTHROSCOPY W/ ACROMIAL REPAIR  11-29-2005   LEFT  . TONSILLECTOMY AND ADENOIDECTOMY  child  . TOTAL KNEE ARTHROPLASTY  12-14-2009   RIGHT  . TOTAL KNEE ARTHROPLASTY Left 11/05/2012   Procedure: LEFT TOTAL KNEE ARTHROPLASTY;  Surgeon: Gearlean Alf, MD;  Location: [facility];  Service: Orthopedics;  Laterality: Left;  . TOTAL KNEE REVISION Right 07/29/2016   Procedure: RIGHT KNEE POLY-LINER EXCHANGE;  Surgeon: Mcarthur Rossetti, MD;  Location: [facility];  Service: Orthopedics;  Laterality: Right;  . TRIGGER FINGER RELEASE Right 02/21/2013   Procedure: RIGHT RING A-1 PULLEY RELEASE    (MINOR PROCEDURE) ;  Surgeon: Cammie Sickle., MD;  Location: [facility];  Service: Orthopedics;  Laterality: Right;    Family History  Problem Relation Age of Onset  . Diabetes Mother   . Hypertension Mother   . Heart attack Mother    Social History:  reports that she quit smoking about 39 years ago. Her smoking use included Cigarettes. She quit after 5.00 years of use. She has never used smokeless tobacco. She reports that she does not drink alcohol or use drugs.  Allergies:  Allergies  Allergen Reactions  . Actos [Pioglitazone Hydrochloride] Swelling and Other (See Comments)    Numbness in lips, HEADACHES SWELLING REACTION UNSPECIFIED   . Avandia [Rosiglitazone] Swelling    Numbness in lips, headaches  SWELLING REACTION UNSPECIFIED   . Tetanus Toxoids Other (See Comments)    Arm  swelling, fainted, ?Respiratory distress  (horse serum)  . Food Nausea Only    Eggplant    Medications Prior to Admission  Medication Sig Dispense Refill  . amLODipine (NORVASC) 5 MG tablet Take 5 mg by mouth every evening.     [name] atorvastatin (LIPITOR) 40 MG tablet Take 40 mg by mouth every evening.     . cholecalciferol (VITAMIN D) 1000 units tablet Take 1,000 Units by mouth every evening.     . docusate sodium (COLACE) 100 MG capsule Take 100 mg by mouth daily as needed for mild constipation.    . DULoxetine (CYMBALTA) 60 MG capsule Take 60 mg by mouth daily.    . flecainide (TAMBOCOR) 50 MG tablet Take 1 tablet (50 mg total) by  mouth 2 (two) times daily. 180 tablet 3  . indomethacin (INDOCIN) 50 MG capsule Take 50 mg by mouth 3 (three) times daily as needed (gout).    . insulin glargine (LANTUS) 100 UNIT/ML injection Inject 60 Units into the skin 2 (two) times daily.     . insulin lispro (HUMALOG) 100 UNIT/ML injection Inject 20 Units into the skin daily before supper. Hold if blood sugar is 140 or less    . levothyroxine (SYNTHROID, LEVOTHROID) 100 MCG tablet Take 100 mcg by mouth daily before breakfast.   2  . Liraglutide (VICTOZA) 18 MG/3ML SOLN Inject 1.8 mg into the skin every evening.     . Melatonin 5 MG CAPS Take 5 mg by mouth at bedtime as needed (sleep).     . metFORMIN (GLUCOPHAGE-XR) 500 MG 24 hr tablet Take 1,000 mg by mouth daily with supper.    . methocarbamol (ROBAXIN) 500 MG tablet Take 1 tablet (500 mg total) by mouth every 6 (six) hours as needed for muscle spasms. 60 tablet 0  . metoprolol tartrate (LOPRESSOR) 25 MG tablet Take 1 tablet (25 mg total) by mouth 2 (two) times daily. 180 tablet 3  . omeprazole (PRILOSEC) 20 MG capsule Take 20 mg by mouth every evening.     [name] oxyCODONE-acetaminophen (ROXICET) 5-325 MG tablet Take 1-2 tablets by mouth every 6 (six) hours as needed. (Patient taking differently: Take 1-2 tablets by mouth every 4 (four) hours as needed for moderate pain. ) 60 tablet 0  . valsartan-hydrochlorothiazide (DIOVAN-HCT) 320-12.5 MG tablet Take 1 tablet by mouth daily.    Alveda Reasons 20 MG TABS tablet TAKE 1 TABLET BY MOUTH ONCE DAILY WITH SUPPER 30 tablet 11    Results for orders placed or performed during the hospital encounter of 09/22/16 (from the past 48 hour(s))  CBC     Status: None   Collection Time: 09/22/16  7:33 AM  Result Value Ref Range   WBC 8.6 4.0 - 10.5 K/uL   RBC 4.31 3.87 - 5.11 MIL/uL   Hemoglobin 12.3 12.0 - 15.0 g/dL   HCT 39.2 36.0 - 46.0 %   MCV 91.0 78.0 - 100.0 fL   MCH 28.5 26.0 - 34.0 pg   MCHC 31.4 30.0 - 36.0 g/dL   RDW 14.9 11.5 - 15.5 %    Platelets 284 150 - 400 K/uL  Basic metabolic panel     Status: Abnormal   Collection Time: 09/22/16  7:33 AM  Result Value Ref Range   Sodium 139 135 - 145 mmol/L   Potassium 3.7 3.5 - 5.1 mmol/L   Chloride 105 101 - 111 mmol/L   CO2 26 22 - 32 mmol/L   Glucose, Bld 91 65 - 99  mg/dL   BUN 20 6 - 20 mg/dL   Creatinine, Ser 1.10 (H) 0.44 - 1.00 mg/dL   Calcium 9.1 8.9 - 10.3 mg/dL   GFR calc non Af Amer 51 (L) >60 mL/min   GFR calc Af Amer 59 (L) >60 mL/min    Comment: (NOTE) The eGFR has been calculated using the CKD EPI equation. This calculation has not been validated in all clinical situations. eGFR's persistently <60 mL/min signify possible Chronic Kidney Disease.    Anion gap 8 5 - 15  Protime-INR     Status: None   Collection Time: 09/22/16  7:33 AM  Result Value Ref Range   Prothrombin Time 13.8 11.4 - 15.2 seconds   INR 1.06   Glucose, capillary     Status: None   Collection Time: 09/22/16  7:50 AM  Result Value Ref Range   Glucose-Capillary 96 65 - 99 mg/dL   No results found.  Review of Systems  All other systems reviewed and are negative.   Blood pressure (!) 215/78, pulse 71, temperature 98 F (36.7 C), temperature source Oral, resp. rate 20, SpO2 98 %. Physical Exam  Constitutional: She is oriented to person, place, and time. She appears well-developed and well-nourished.  HENT:  Head: Normocephalic and atraumatic.  Eyes: Pupils are equal, round, and reactive to light. EOM are normal.  Neck: Normal range of motion. Neck supple.  Cardiovascular: Normal rate and regular rhythm.   Respiratory: Effort normal and breath sounds normal.  GI: Soft. Bowel sounds are normal.  Musculoskeletal:       Hands: Neurological: She is alert and oriented to person, place, and time.  Skin: Skin is warm and dry.  Psychiatric: She has a normal mood and affect.     Assessment/Plan Left index finger recurrent trigger finger. 1)  To the OR today as an outpatient for a left  index finger A1 pulley release.  Risks and benefits discussed in detail and informed consent is obtained.  Mcarthur Rossetti, MD 09/22/2016, 9:07 AM

## 2016-09-23 ENCOUNTER — Encounter (HOSPITAL_COMMUNITY): Payer: Self-pay | Admitting: Orthopaedic Surgery

## 2016-09-23 MED FILL — HUMALOG 100 UNITS/ML KWIKPE: 100 | 60 days supply | Qty: 12 | Fill #2

## 2016-09-23 MED FILL — VALSARTAN-HCTZ 320-12.5 MG: 320-12.5 | 30 days supply | Qty: 30 | Fill #4

## 2016-09-23 MED FILL — AMOXICILLIN 500 MG CAPSULE: 500 | 5 days supply | Qty: 20 | Fill #0

## 2016-09-26 ENCOUNTER — Encounter: Payer: Self-pay | Admitting: *Deleted

## 2016-09-26 ENCOUNTER — Ambulatory Visit: Payer: 59

## 2016-09-26 DIAGNOSIS — M25561 Pain in right knee: Secondary | ICD-10-CM | POA: Diagnosis not present

## 2016-09-26 DIAGNOSIS — M25661 Stiffness of right knee, not elsewhere classified: Secondary | ICD-10-CM

## 2016-09-26 DIAGNOSIS — R262 Difficulty in walking, not elsewhere classified: Secondary | ICD-10-CM | POA: Diagnosis not present

## 2016-09-26 DIAGNOSIS — M6281 Muscle weakness (generalized): Secondary | ICD-10-CM

## 2016-09-26 DIAGNOSIS — R600 Localized edema: Secondary | ICD-10-CM | POA: Diagnosis not present

## 2016-09-26 NOTE — Therapy (Signed)
[facility] [address], [address] [place], [place], [address] Phone: [phone]   Fax:  [phone]  Physical Therapy Treatment  Patient Details  Name: [name] MRN: [id number] Date of Birth: 04/26/1950 Referring Provider: Doneen PoissonBlackman, Christopher, MD  Encounter Date: 09/26/2016      PT End of Session - 09/26/16 1147    Visit Number 13   Number of Visits 20   Date for PT Re-Evaluation 10/11/16   Authorization Type G-codes based on age   PT Start Time 1100   PT Stop Time 1159   PT Time Calculation (min) 59 min   Activity Tolerance Patient tolerated treatment well   Behavior During Therapy [facility] for tasks assessed/performed      Past Medical History:  Diagnosis Date  . Anemia    hx of  . Arthritis   . Depression   . Diabetes mellitus [facility]  . GERD (gastroesophageal reflux disease)    occasional  . History of kidney stones   . Hyperlipidemia   . Hypertension   . Hypothyroidism   . Knee pain, right   . 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 OSA on CPAP   . Osteoarthritis   . Pinched nerve in neck   . Pneumonia    hx of  . PONV (postoperative nausea and vomiting)    for 3-4 days after general anesthesia  . Swelling of knee joint, right   . Synovitis of knee RIGHT  . Trigger finger, left    left index    Past Surgical History:  Procedure Laterality Date  . CESAREAN SECTION  X3  . KNEE ARTHROSCOPY  04/20/2011   Procedure: ARTHROSCOPY KNEE;  Surgeon: Loanne DrillingFrank V Aluisio, MD;  [name];  Service: Orthopedics;  Laterality: Right;  WITH SYNOVECTOMY  . LEFT CARPAL TUNNEL / LEFT MIDDLE & RING FINGER TRIGGER RELEASE  08-26-2008  . LEFT SHOULDER ARTHROSCOPY W/ DEBRIDEMENT  09-09-2003  . LEFT SHOULDER ARTHROSCOPY/ LEFT THUMB TRIGGER RELEASE  02-22-2005  . PULLEY RELEASE LEFT LONG FINGER  07-14-2009  . RIGHT CARPAL TUNNEL/ RIGHT THUMB TRIGGER RELEASE'S  11-28-2006  . RIGHT  SHOULDER ARTHROSCOPY W/ ROTATOR CUFF REPAIR  01-13-2004  . SHOULDER ARTHROSCOPY DISTAL CLAVICLE EXCISION AND OPEN ROTATOR CUFF REPAIR  09-07-2004   LEFT  . SHOULDER ARTHROSCOPY W/ ACROMIAL REPAIR  11-29-2005   LEFT  . TONSILLECTOMY AND ADENOIDECTOMY  child  . TOTAL KNEE ARTHROPLASTY  12-14-2009   RIGHT  . TOTAL KNEE ARTHROPLASTY Left 11/05/2012   Procedure: LEFT TOTAL KNEE ARTHROPLASTY;  Surgeon: Loanne DrillingFrank V Aluisio, MD;  Location: [facility];  Service: Orthopedics;  Laterality: Left;  . TOTAL KNEE REVISION Right 07/29/2016   Procedure: RIGHT KNEE POLY-LINER EXCHANGE;  Surgeon: Kathryne HitchBlackman, Christopher Y, MD;  Location: [facility];  Service: Orthopedics;  Laterality: Right;  . TRIGGER FINGER RELEASE Right 02/21/2013   Procedure: RIGHT RING A-1 PULLEY RELEASE    (MINOR PROCEDURE) ;  Surgeon: Wyn Forsterobert V Sypher Jr., MD;  [facility];  Service: Orthopedics;  Laterality: Right;  . TRIGGER FINGER RELEASE Left 09/22/2016   Procedure: RELEASE TRIGGER FINGER LEFT INDEX FINGER;  Surgeon: Kathryne HitchBlackman, Christopher Y, MD;  Location: [facility];  Service: Orthopedics;  Laterality: Left;    There were no vitals filed for this visit.      Subjective Assessment - 09/26/16 1114    Subjective Pt had trigger finger release surgery last week.  Recovering well.  Currently in Pain? Yes   Pain Score 2    Pain Location Knee   Pain Orientation Right   Pain Descriptors / Indicators Aching;Tightness   Pain Type Surgical pain   Pain Onset 1 to 4 weeks ago   Pain Frequency Intermittent   Aggravating Factors  at lot of walking, getting out of a low car   Pain Relieving Factors ice, elevation, pain medication                         [facility] Adult PT Treatment/Exercise - 09/26/16 0001      Knee/Hip Exercises: Aerobic   Stationary Bike Level 1 x 8 minutes      Knee/Hip Exercises: Machines for Strengthening   Cybex Leg Press Bil. 50# x30, Rt only 35# 3x10  seat 6     Knee/Hip Exercises:  Standing   Heel Raises Both;2 sets;20 reps   Step Down Right;20 reps;Step Height: 6";Hand Hold: 1   Rocker Board 3 minutes   Walking with Sports Cord 25# forward and reverse x 10 minutes     Knee/Hip Exercises: Seated   Long Arc Quad Strengthening;Right;2 sets;10 reps;Weights   Long Arc Quad Weight 3 lbs.   Hamstring Curl Strengthening;Right;2 sets;10 reps   Hamstring Limitations red band     Vasopneumatic   Number Minutes Vasopneumatic  15 minutes   Vasopnuematic Location  Knee   Vasopneumatic Pressure Medium   Vasopneumatic Temperature  3 flakes                  PT Short Term Goals - 09/16/16 0906      PT SHORT TERM GOAL #1   Title be independent in initial HEP   Status Achieved     PT SHORT TERM GOAL #2   Title demonstrate Rt knee A/ROM flexion to > or = to 105 degrees to improve squatting and sit to stand transition   Status Achieved     PT SHORT TERM GOAL #3   Title demonstrate symmetry with ambulation on level surface   Status Achieved     PT SHORT TERM GOAL #4   Title report < or = to 4/10 Rt knee pain with standing and walking   Status Achieved     PT SHORT TERM GOAL #5   Title improve endurance to stand and walk for > or = to 15-20 minutes to improve community function   Status Achieved           PT Long Term Goals - 09/19/16 1022      PT LONG TERM GOAL #1   Title be independent in advanced HEP   Time 8   Period Weeks   Status On-going     PT LONG TERM GOAL #2   Title reduce FOTO to < or = to 44% limitation   Baseline 49%   Time 8   Period Weeks   Status On-going     PT LONG TERM GOAL #3   Title The patient will be able to walk/stand for 1.5 hours needed for work duties and grocery shopping   Baseline 45 minutes to an hour   Time 8   Period Weeks   Status On-going     PT LONG TERM GOAL #5   Title demonstrate Rt knee A/ROM flexion to 115 degrees to improve squatting and descending steps   Status Achieved  Plan - 09/26/16 1121    Clinical Impression Statement Pt with symmetrical gait pattern and increased tolerance for standing and walking.  Pt with continued mild edema.  Pt tolerated increased weight on leg press and with long arc quads today.  Pt will continued to benefit from skilled PT PT for Rt knee strength, endurance and edema management.     Rehab Potential Good   PT Frequency 2x / week   PT Duration 8 weeks   PT Treatment/Interventions ADLs/Self Care Home Management;Cryotherapy;Electrical Stimulation;Functional mobility training;Stair training;Gait training;Therapeutic activities;Therapeutic exercise;Neuromuscular re-education;Patient/family education;Passive range of motion;Scar mobilization;Manual techniques;Taping;Vasopneumatic Device   PT Next Visit Plan manual and modalities for AROM and edema management, strength, proprioception   Consulted and Agree with Plan of Care Patient      Patient will benefit from skilled therapeutic intervention in order to improve the following deficits and impairments:  Abnormal gait, Decreased activity tolerance, Decreased strength, Increased edema, Impaired flexibility, Pain, Decreased endurance, Decreased range of motion, Difficulty walking  Visit Diagnosis: Muscle weakness (generalized)  Difficulty in walking, not elsewhere classified  Acute pain of right knee  Localized edema  Stiffness of right knee, not elsewhere classified     Problem List Patient Active Problem List   Diagnosis Date Noted  . Trigger index finger of left hand 09/22/2016  . 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 Diabetes (HCC) 01/08/2014  . Essential hypertension 01/08/2014  . Hyperlipidemia 01/08/2014  . Postoperative anemia due to acute blood loss 11/22/2012  . Hyponatremia 11/06/2012  . OA (osteoarthritis)  of knee 11/05/2012  . Villonodular synovitis of knee 04/20/2011     Lorrene Reid, PT 09/26/16 11:48 AM  [facility] [address], [address] [place], [place], [address] Phone: [phone]   Fax:  [phone]  Name: [name] MRN: [id number] Date of Birth: 11-20-1950

## 2016-09-28 MED FILL — FLECAINIDE ACETATE 50 MG TA: 50 | 90 days supply | Qty: 180 | Fill #1

## 2016-09-29 ENCOUNTER — Ambulatory Visit: Payer: 59

## 2016-09-29 DIAGNOSIS — M25661 Stiffness of right knee, not elsewhere classified: Secondary | ICD-10-CM | POA: Diagnosis not present

## 2016-09-29 DIAGNOSIS — M25561 Pain in right knee: Secondary | ICD-10-CM | POA: Diagnosis not present

## 2016-09-29 DIAGNOSIS — R600 Localized edema: Secondary | ICD-10-CM | POA: Diagnosis not present

## 2016-09-29 DIAGNOSIS — R262 Difficulty in walking, not elsewhere classified: Secondary | ICD-10-CM | POA: Diagnosis not present

## 2016-09-29 DIAGNOSIS — M6281 Muscle weakness (generalized): Secondary | ICD-10-CM | POA: Diagnosis not present

## 2016-09-29 NOTE — Therapy (Signed)
[facility] [address], [address] [place], [place], [address] Phone: [phone]   Fax:  [phone]  Physical Therapy Treatment  Patient Details  Name: [name] MRN: [id number] Date of Birth: [birth year] Referring Provider: Doneen PoissonBlackman, Christopher, MD  Encounter Date: 09/29/2016      PT End of Session - 09/29/16 1119    Visit Number 14   Number of Visits 20   Date for PT Re-Evaluation 10/11/16   Authorization Type G-codes based on age   PT Start Time 1054   PT Stop Time 1157   PT Time Calculation (min) 63 min   Activity Tolerance Patient tolerated treatment well   Behavior During Therapy [facility] for tasks assessed/performed      Past Medical History:  Diagnosis Date  . Anemia    hx of  . Arthritis   . Depression   . Diabetes mellitus [facility]  . GERD (gastroesophageal reflux disease)    occasional  . History of kidney stones   . Hyperlipidemia   . Hypertension   . Hypothyroidism   . Knee pain, right   . 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 OSA on CPAP   . Osteoarthritis   . Pinched nerve in neck   . Pneumonia    hx of  . PONV (postoperative nausea and vomiting)    for 3-4 days after general anesthesia  . Swelling of knee joint, right   . Synovitis of knee RIGHT  . Trigger finger, left    left index    Past Surgical History:  Procedure Laterality Date  . CESAREAN SECTION  X3  . KNEE ARTHROSCOPY  04/20/2011   Procedure: ARTHROSCOPY KNEE;  Surgeon: Loanne DrillingFrank V Aluisio, MD;  [facility] [place];  Service: Orthopedics;  Laterality: Right;  WITH SYNOVECTOMY  . LEFT CARPAL TUNNEL / LEFT MIDDLE & RING FINGER TRIGGER RELEASE  08-26-2008  . LEFT SHOULDER ARTHROSCOPY W/ DEBRIDEMENT  09-09-2003  . LEFT SHOULDER ARTHROSCOPY/ LEFT THUMB TRIGGER RELEASE  02-22-2005  . PULLEY RELEASE LEFT LONG FINGER  07-14-2009  . RIGHT CARPAL TUNNEL/ RIGHT THUMB TRIGGER RELEASE'S  11-28-2006  . RIGHT  SHOULDER ARTHROSCOPY W/ ROTATOR CUFF REPAIR  01-13-2004  . SHOULDER ARTHROSCOPY DISTAL CLAVICLE EXCISION AND OPEN ROTATOR CUFF REPAIR  09-07-2004   LEFT  . SHOULDER ARTHROSCOPY W/ ACROMIAL REPAIR  11-29-2005   LEFT  . TONSILLECTOMY AND ADENOIDECTOMY  child  . TOTAL KNEE ARTHROPLASTY  12-14-2009   RIGHT  . TOTAL KNEE ARTHROPLASTY Left 11/05/2012   Procedure: LEFT TOTAL KNEE ARTHROPLASTY;  Surgeon: Loanne DrillingFrank V Aluisio, MD;  Location: [facility];  Service: Orthopedics;  Laterality: Left;  . TOTAL KNEE REVISION Right 07/29/2016   Procedure: RIGHT KNEE POLY-LINER EXCHANGE;  Surgeon: Kathryne HitchBlackman, Christopher Y, MD;  Location: [facility];  Service: Orthopedics;  Laterality: Right;  . TRIGGER FINGER RELEASE Right 02/21/2013   Procedure: RIGHT RING A-1 PULLEY RELEASE    (MINOR PROCEDURE) ;  Surgeon: Wyn Forsterobert V Sypher Jr., MD;  [facility] [place];  Service: Orthopedics;  Laterality: Right;  . TRIGGER FINGER RELEASE Left 09/22/2016   Procedure: RELEASE TRIGGER FINGER LEFT INDEX FINGER;  Surgeon: Kathryne HitchBlackman, Christopher Y, MD;  Location: [facility];  Service: Orthopedics;  Laterality: Left;    There were no vitals filed for this visit.      Subjective Assessment - 09/29/16 1105    Subjective Surgical site on Lt hand is bothering me.     Currently in  Pain? Yes   Pain Score 2    Pain Location Knee   Pain Orientation Right                         [facility] Adult PT Treatment/Exercise - 09/29/16 0001      Knee/Hip Exercises: Aerobic   Nustep Level 1 x 10 minutes     Knee/Hip Exercises: Machines for Strengthening   Cybex Leg Press Bil. 50# x30, Rt only 35# 3x10  seat 6     Knee/Hip Exercises: Standing   Heel Raises Both;2 sets;20 reps   Step Down Right;20 reps;Step Height: 6";Hand Hold: 1   Rocker Board 3 minutes   Rebounder 3 directions x 1 minute   Walking with Sports Cord 25# forward and reverse x 10 minutes     Knee/Hip Exercises: Seated   Long Arc Quad Strengthening;Right;2  sets;10 reps;Weights   Long Arc Quad Weight 3 lbs.   Hamstring Curl Strengthening;Right;2 sets;10 reps   Hamstring Limitations red band     Vasopneumatic   Number Minutes Vasopneumatic  15 minutes   Vasopnuematic Location  Knee   Vasopneumatic Pressure Medium   Vasopneumatic Temperature  3 flakes                  PT Short Term Goals - 09/16/16 0906      PT SHORT TERM GOAL #1   Title be independent in initial HEP   Status Achieved     PT SHORT TERM GOAL #2   Title demonstrate Rt knee A/ROM flexion to > or = to 105 degrees to improve squatting and sit to stand transition   Status Achieved     PT SHORT TERM GOAL #3   Title demonstrate symmetry with ambulation on level surface   Status Achieved     PT SHORT TERM GOAL #4   Title report < or = to 4/10 Rt knee pain with standing and walking   Status Achieved     PT SHORT TERM GOAL #5   Title improve endurance to stand and walk for > or = to 15-20 minutes to improve community function   Status Achieved           PT Long Term Goals - 09/19/16 1022      PT LONG TERM GOAL #1   Title be independent in advanced HEP   Time 8   Period Weeks   Status On-going     PT LONG TERM GOAL #2   Title reduce FOTO to < or = to 44% limitation   Baseline 49%   Time 8   Period Weeks   Status On-going     PT LONG TERM GOAL #3   Title The patient will be able to walk/stand for 1.5 hours needed for work duties and grocery shopping   Baseline 45 minutes to an hour   Time 8   Period Weeks   Status On-going     PT LONG TERM GOAL #5   Title demonstrate Rt knee A/ROM flexion to 115 degrees to improve squatting and descending steps   Status Achieved               Plan - 09/29/16 1116    Clinical Impression Statement Pt with symmetrical gait pattern and reports increased tolerance for standing and walking.  Pt with continued mild edema.  Pt tolerated incresaed weight on leg press and with long arc quads today.  Pt will  continue  to benefit from skilled PT for Rt knee strength, endurance and edema management.     Rehab Potential Good   PT Frequency 2x / week   PT Duration 8 weeks   PT Treatment/Interventions ADLs/Self Care Home Management;Cryotherapy;Electrical Stimulation;Functional mobility training;Stair training;Gait training;Therapeutic activities;Therapeutic exercise;Neuromuscular re-education;Patient/family education;Passive range of motion;Scar mobilization;Manual techniques;Taping;Vasopneumatic Device   PT Next Visit Plan manual and modalities for AROM and edema management, strength, proprioception   Consulted and Agree with Plan of Care Patient      Patient will benefit from skilled therapeutic intervention in order to improve the following deficits and impairments:  Abnormal gait, Decreased activity tolerance, Decreased strength, Increased edema, Impaired flexibility, Pain, Decreased endurance, Decreased range of motion, Difficulty walking  Visit Diagnosis: Muscle weakness (generalized)  Difficulty in walking, not elsewhere classified  Acute pain of right knee  Localized edema  Stiffness of right knee, not elsewhere classified     Problem List Patient Active Problem List   Diagnosis Date Noted  . Trigger index finger of left hand 09/22/2016  . 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 Diabetes (HCC) 01/08/2014  . Essential hypertension 01/08/2014  . Hyperlipidemia 01/08/2014  . Postoperative anemia due to acute blood loss 11/22/2012  . Hyponatremia 11/06/2012  . OA (osteoarthritis) of knee 11/05/2012  . Villonodular synovitis of knee 04/20/2011    [name], PT 09/29/16 11:39 AM  [facility] [address], [address] [place], [place], [address] Phone: [phone]   Fax:   [phone]  Name: [name] MRN: [id number] Date of Birth: 1950-10-29

## 2016-10-03 ENCOUNTER — Ambulatory Visit: Payer: 59

## 2016-10-03 ENCOUNTER — Ambulatory Visit (INDEPENDENT_AMBULATORY_CARE_PROVIDER_SITE_OTHER): Payer: 59 | Admitting: Orthopaedic Surgery

## 2016-10-03 DIAGNOSIS — R262 Difficulty in walking, not elsewhere classified: Secondary | ICD-10-CM

## 2016-10-03 DIAGNOSIS — R600 Localized edema: Secondary | ICD-10-CM

## 2016-10-03 DIAGNOSIS — M25561 Pain in right knee: Secondary | ICD-10-CM | POA: Diagnosis not present

## 2016-10-03 DIAGNOSIS — M6281 Muscle weakness (generalized): Secondary | ICD-10-CM

## 2016-10-03 DIAGNOSIS — M65322 Trigger finger, left index finger: Secondary | ICD-10-CM

## 2016-10-03 DIAGNOSIS — M25661 Stiffness of right knee, not elsewhere classified: Secondary | ICD-10-CM

## 2016-10-03 MED ORDER — SULFAMETHOXAZOLE-TRIMETHOPRIM 800-160 MG PO TABS: 1.0000 | ORAL_TABLET | Freq: Two times a day (BID) | ORAL | 0 refills | Status: DC

## 2016-10-03 MED FILL — SULFAMETHOXAZOLE/TMP DS TAB: 800-160 | 10 days supply | Qty: 20 | Fill #0

## 2016-10-03 NOTE — Progress Notes (Signed)
The patient is just under 2 weeks status post a trigger finger release of her left index finger. She's been having pain and irritation sutures and some slight drainage.  On examination he may just be more of a suture irritation due to my concern about infection but given the fact that we've performed a knee revision with a polyliner exchange recently I will start her on an antibiotic orally. She agrees with this as well. She is excellent moving her index finger well and there is no active triggering.  She'll soak daily and I'll soapy water for about 15-20 minutes her hand. I'll see her back in 3 weeks how she doing overall her sooner if there is any issues.

## 2016-10-03 NOTE — Therapy (Signed)
[facility] [address] [place], [place], [address] Phone: [phone]   Fax:  [phone]  Physical Therapy Treatment  Patient Details  Name: [name] MRN: [id number] Date of Birth: 1950-05-11 Referring Provider: Doneen Poisson, MD  Encounter Date: 10/03/2016      PT End of Session - 10/03/16 1144    Visit Number 15   Number of Visits 20   Date for PT Re-Evaluation Oct 22, 2016   Authorization Type G-codes based on age   PT Start Time 1055   PT Stop Time 1140   PT Time Calculation (min) 45 min   Activity Tolerance Patient tolerated treatment well   Behavior During Therapy [facility] for tasks assessed/performed      Past Medical History:  Diagnosis Date  . Anemia    hx of  . Arthritis   . Depression   . Diabetes mellitus [facility]  . GERD (gastroesophageal reflux disease)    occasional  . History of kidney stones   . Hyperlipidemia   . Hypertension   . Hypothyroidism   . Knee pain, right   . 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 OSA on CPAP   . Osteoarthritis   . Pinched nerve in neck   . Pneumonia    hx of  . PONV (postoperative nausea and vomiting)    for 3-4 days after general anesthesia  . Swelling of knee joint, right   . Synovitis of knee RIGHT  . Trigger finger, left    left index    Past Surgical History:  Procedure Laterality Date  . CESAREAN SECTION  X3  . KNEE ARTHROSCOPY  04/20/2011   Procedure: ARTHROSCOPY KNEE;  Surgeon: Loanne Drilling, MD;  Location: [facility];  Service: Orthopedics;  Laterality: Right;  WITH SYNOVECTOMY  . LEFT CARPAL TUNNEL / LEFT MIDDLE & RING FINGER TRIGGER RELEASE  08-26-2008  . LEFT SHOULDER ARTHROSCOPY W/ DEBRIDEMENT  09-09-2003  . LEFT SHOULDER ARTHROSCOPY/ LEFT THUMB TRIGGER RELEASE  02-22-2005  . PULLEY RELEASE LEFT LONG FINGER  07-14-2009  . RIGHT CARPAL TUNNEL/ RIGHT THUMB TRIGGER RELEASE'S  11-28-2006  . RIGHT  SHOULDER ARTHROSCOPY W/ ROTATOR CUFF REPAIR  01-13-2004  . SHOULDER ARTHROSCOPY DISTAL CLAVICLE EXCISION AND OPEN ROTATOR CUFF REPAIR  09-07-2004   LEFT  . SHOULDER ARTHROSCOPY W/ ACROMIAL REPAIR  11-29-2005   LEFT  . TONSILLECTOMY AND ADENOIDECTOMY  child  . TOTAL KNEE ARTHROPLASTY  12-14-2009   RIGHT  . TOTAL KNEE ARTHROPLASTY Left 11/05/2012   Procedure: LEFT TOTAL KNEE ARTHROPLASTY;  Surgeon: Loanne Drilling, MD;  Location: [facility];  Service: Orthopedics;  Laterality: Left;  . TOTAL KNEE REVISION Right 07/29/2016   Procedure: RIGHT KNEE POLY-LINER EXCHANGE;  Surgeon: Kathryne Hitch, MD;  Location: [facility];  Service: Orthopedics;  Laterality: Right;  . TRIGGER FINGER RELEASE Right 02/21/2013   Procedure: RIGHT RING A-1 PULLEY RELEASE    (MINOR PROCEDURE) ;  Surgeon: Wyn Forster., MD;  Location: [facility];  Service: Orthopedics;  Laterality: Right;  . TRIGGER FINGER RELEASE Left 09/22/2016   Procedure: RELEASE TRIGGER FINGER LEFT INDEX FINGER;  Surgeon: Kathryne Hitch, MD;  Location: [facility];  Service: Orthopedics;  Laterality: Left;    There were no vitals filed for this visit.      Subjective Assessment - 10/03/16 1118    Subjective I think my surgicial incision on my Lt hand is infected. I've called the  MD.     Patient Stated Goals return to work, normalize gait, reduce pain, improve A/ROM and strength   Currently in Pain? Yes   Pain Score 0-No pain   Pain Location Knee   Pain Orientation Right   Pain Descriptors / Indicators Sore   Pain Type Surgical pain   Pain Onset More than a month ago   Pain Frequency Intermittent   Aggravating Factors  when stiff, with a lot of walking   Pain Relieving Factors ice, elevation, pain medication                         [facility] Adult PT Treatment/Exercise - 10/03/16 0001      Knee/Hip Exercises: Aerobic   Nustep Level 1 x 10 minutes     Knee/Hip Exercises: Machines for Strengthening    Cybex Leg Press Bil. 50# x30, Rt only 35# 3x10  seat 6     Knee/Hip Exercises: Standing   Heel Raises Both;2 sets;20 reps   Rebounder 3 directions x 1 minute   Walking with Sports Cord 25# forward and reverse x 10, sidestepping x 10     Knee/Hip Exercises: Seated   Long Arc Quad Strengthening;Right;2 sets;10 reps;Weights   Long Arc Quad Weight 3 lbs.   Hamstring Curl Strengthening;Right;2 sets;10 reps   Hamstring Limitations red band                  PT Short Term Goals - 09/16/16 0906      PT SHORT TERM GOAL #1   Title be independent in initial HEP   Status Achieved     PT SHORT TERM GOAL #2   Title demonstrate Rt knee A/ROM flexion to > or = to 105 degrees to improve squatting and sit to stand transition   Status Achieved     PT SHORT TERM GOAL #3   Title demonstrate symmetry with ambulation on level surface   Status Achieved     PT SHORT TERM GOAL #4   Title report < or = to 4/10 Rt knee pain with standing and walking   Status Achieved     PT SHORT TERM GOAL #5   Title improve endurance to stand and walk for > or = to 15-20 minutes to improve community function   Status Achieved           PT Long Term Goals - 10/03/16 1123      PT LONG TERM GOAL #1   Title be independent in advanced HEP   Time 8   Period Weeks   Status On-going     PT LONG TERM GOAL #2   Title reduce FOTO to < or = to 44% limitation   Baseline 49%   Time 8   Period Weeks   Status On-going     PT LONG TERM GOAL #3   Title The patient will be able to walk/stand for 1.5 hours needed for work duties and grocery shopping   Status Achieved     PT LONG TERM GOAL #4   Title report a 75% reduction in Rt knee pain with standing and walking   Status Achieved     PT LONG TERM GOAL #5   Title demonstrate Rt knee A/ROM flexion to 115 degrees to improve squatting and descending steps   Status Achieved               Plan - 10/03/16 1121    Clinical Impression Statement  Pt  will return to work next week and reports that hse is back to normal function.  Pt with functional weakness of the Rt LE. Pt declined [employer] due to no edema.   Pt will continue to benefit from skilled PT for Rt LE strength, endurance, gait and edema management.     Rehab Potential Good   PT Frequency 2x / week   PT Duration 8 weeks   PT Treatment/Interventions ADLs/Self Care Home Management;Cryotherapy;Electrical Stimulation;Functional mobility training;Stair training;Gait training;Therapeutic activities;Therapeutic exercise;Neuromuscular re-education;Patient/family education;Passive range of motion;Scar mobilization;Manual techniques;Taping;Vasopneumatic Device   PT Next Visit Plan manual and modalities for AROM and edema management, strength, proprioception.  D/C next session.     Consulted and Agree with Plan of Care Patient      Patient will benefit from skilled therapeutic intervention in order to improve the following deficits and impairments:  Abnormal gait, Decreased activity tolerance, Decreased strength, Increased edema, Impaired flexibility, Pain, Decreased endurance, Decreased range of motion, Difficulty walking  Visit Diagnosis: Muscle weakness (generalized)  Difficulty in walking, not elsewhere classified  Acute pain of right knee  Localized edema  Stiffness of right knee, not elsewhere classified     Problem List Patient Active Problem List   Diagnosis Date Noted  . Trigger index finger of left hand 09/22/2016  . 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 Diabetes (HCC) 01/08/2014  . Essential hypertension 01/08/2014  . Hyperlipidemia 01/08/2014  . Postoperative anemia due to acute blood loss 11/22/2012  . Hyponatremia 11/06/2012  . OA (osteoarthritis) of knee 11/05/2012  . Villonodular synovitis of knee  04/20/2011     Lorrene Reid, PT 10/03/16 11:50 AM  [facility] [address], [address] [place], [place], [address] Phone: [phone]   Fax:  [phone]  Name: DENAJAH FARIAS MRN: [id number] Date of Birth: March 18, 1950

## 2016-10-06 ENCOUNTER — Inpatient Hospital Stay (INDEPENDENT_AMBULATORY_CARE_PROVIDER_SITE_OTHER): Payer: 59 | Admitting: Orthopaedic Surgery

## 2016-10-06 ENCOUNTER — Ambulatory Visit: Payer: 59

## 2016-10-06 DIAGNOSIS — R262 Difficulty in walking, not elsewhere classified: Secondary | ICD-10-CM | POA: Diagnosis not present

## 2016-10-06 DIAGNOSIS — M25661 Stiffness of right knee, not elsewhere classified: Secondary | ICD-10-CM

## 2016-10-06 DIAGNOSIS — M6281 Muscle weakness (generalized): Secondary | ICD-10-CM | POA: Diagnosis not present

## 2016-10-06 DIAGNOSIS — M25561 Pain in right knee: Secondary | ICD-10-CM | POA: Diagnosis not present

## 2016-10-06 DIAGNOSIS — R600 Localized edema: Secondary | ICD-10-CM | POA: Diagnosis not present

## 2016-10-06 NOTE — Therapy (Signed)
[facility] [address], [place], [place], [address] Phone: [phone]   Fax:  [phone]  Physical Therapy Treatment  Patient Details  Name: [name] MRN: [id number] Date of Birth: [birth year] Referring Provider: Jean Rosenthal, MD  Encounter Date: 10/06/2016      PT End of Session - 10/06/16 1141    Visit Number 16   PT Start Time 1100   PT Stop Time 1147   PT Time Calculation (min) 47 min   Activity Tolerance Patient tolerated treatment well   Behavior During Therapy [facility] for tasks assessed/performed      Past Medical History:  Diagnosis Date  . Anemia    hx of  . Arthritis   . Depression   . Diabetes mellitus [facility]  . GERD (gastroesophageal reflux disease)    occasional  . History of kidney stones   . Hyperlipidemia   . Hypertension   . Hypothyroidism   . Knee pain, right   . 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 OSA on CPAP   . Osteoarthritis   . Pinched nerve in neck   . Pneumonia    hx of  . PONV (postoperative nausea and vomiting)    for 3-4 days after general anesthesia  . Swelling of knee joint, right   . Synovitis of knee RIGHT  . Trigger finger, left    left index    Past Surgical History:  Procedure Laterality Date  . CESAREAN SECTION  X3  . KNEE ARTHROSCOPY  04/20/2011   Procedure: ARTHROSCOPY KNEE;  Surgeon: Gearlean Alf, MD;  Location: [facility];  Service: Orthopedics;  Laterality: Right;  WITH SYNOVECTOMY  . LEFT CARPAL TUNNEL / LEFT MIDDLE & RING FINGER TRIGGER RELEASE  08-26-2008  . LEFT SHOULDER ARTHROSCOPY W/ DEBRIDEMENT  09-09-2003  . LEFT SHOULDER ARTHROSCOPY/ LEFT THUMB TRIGGER RELEASE  02-22-2005  . PULLEY RELEASE LEFT LONG FINGER  07-14-2009  . RIGHT CARPAL TUNNEL/ RIGHT THUMB TRIGGER RELEASE'S  11-28-2006  . RIGHT SHOULDER ARTHROSCOPY W/ ROTATOR CUFF REPAIR  01-13-2004  . SHOULDER ARTHROSCOPY DISTAL CLAVICLE EXCISION  AND OPEN ROTATOR CUFF REPAIR  09-07-2004   LEFT  . SHOULDER ARTHROSCOPY W/ ACROMIAL REPAIR  11-29-2005   LEFT  . TONSILLECTOMY AND ADENOIDECTOMY  child  . TOTAL KNEE ARTHROPLASTY  12-14-2009   RIGHT  . TOTAL KNEE ARTHROPLASTY Left 11/05/2012   Procedure: LEFT TOTAL KNEE ARTHROPLASTY;  Surgeon: Gearlean Alf, MD;  Location: [facility];  Service: Orthopedics;  Laterality: Left;  . TOTAL KNEE REVISION Right 07/29/2016   Procedure: RIGHT KNEE POLY-LINER EXCHANGE;  Surgeon: Mcarthur Rossetti, MD;  Location: [facility];  Service: Orthopedics;  Laterality: Right;  . TRIGGER FINGER RELEASE Right 02/21/2013   Procedure: RIGHT RING A-1 PULLEY RELEASE    (MINOR PROCEDURE) ;  Surgeon: Cammie Sickle., MD;  Location: [facility];  Service: Orthopedics;  Laterality: Right;  . TRIGGER FINGER RELEASE Left 09/22/2016   Procedure: RELEASE TRIGGER FINGER LEFT INDEX FINGER;  Surgeon: Mcarthur Rossetti, MD;  Location: [place];  Service: Orthopedics;  Laterality: Left;    There were no vitals filed for this visit.      Subjective Assessment - 10/06/16 1112    Subjective I am taking an oral antibiotic for my Lt finger.  Ready for D/C to MD.     Pertinent History Rt TKA 12/2009, Lt TKA 2014   Currently in Pain? Yes  Pain Score 1    Pain Location Knee   Pain Orientation Right   Pain Descriptors / Indicators Sore   Pain Type Surgical pain   Pain Onset More than a month ago   Pain Frequency Intermittent   Aggravating Factors  when stiff, a lot of walking   Pain Relieving Factors ice, elevation, pain medication            [facility] PT Assessment - 10/06/16 0001      Assessment   Medical Diagnosis history of total Rt knee replacement, poly liner exchange     Observation/Other Assessments   Focus on Therapeutic Outcomes (FOTO)  45% limitation     AROM   Right Knee Extension 3   Right Knee Flexion 121     Strength   Right Knee Flexion 4+/5   Right Knee Extension 5/5                      [facility] Adult PT Treatment/Exercise - 10/06/16 0001      Knee/Hip Exercises: Aerobic   Nustep Level 1 x 10 minutes     Knee/Hip Exercises: Machines for Strengthening   Cybex Leg Press Bil. 50# x30, Rt only 35# 3x10  seat 6     Knee/Hip Exercises: Standing   Heel Raises Both;2 sets;20 reps   Rebounder 3 directions x 1 minute   Walking with Sports Cord 25# forward and reverse x 10, sidestepping x 10     Knee/Hip Exercises: Seated   Long Arc Quad Strengthening;Right;2 sets;10 reps;Weights   Long Arc Quad Weight 3 lbs.                  PT Short Term Goals - 09/16/16 0906      PT SHORT TERM GOAL #1   Title be independent in initial HEP   Status Achieved     PT SHORT TERM GOAL #2   Title demonstrate Rt knee A/ROM flexion to > or = to 105 degrees to improve squatting and sit to stand transition   Status Achieved     PT SHORT TERM GOAL #3   Title demonstrate symmetry with ambulation on level surface   Status Achieved     PT SHORT TERM GOAL #4   Title report < or = to 4/10 Rt knee pain with standing and walking   Status Achieved     PT SHORT TERM GOAL #5   Title improve endurance to stand and walk for > or = to 15-20 minutes to improve community function   Status Achieved           PT Long Term Goals - 10/06/16 1115      PT LONG TERM GOAL #1   Title be independent in advanced HEP   Status Achieved     PT LONG TERM GOAL #2   Title reduce FOTO to < or = to 44% limitation   Baseline 45%    Status Partially Met     PT LONG TERM GOAL #3   Title The patient will be able to walk/stand for 1.5 hours needed for work duties and grocery shopping   Status Achieved     PT [place] #4   Title report a 75% reduction in Rt knee pain with standing and walking   Status Achieved     PT LONG TERM GOAL #5   Title demonstrate Rt knee A/ROM flexion to 115 degrees to improve squatting and descending steps  Status Achieved     PT  LONG TERM GOAL #6   Title improve Rt knee strength to ascend and descend steps with step-over-step gait with use of 1 rail   Status Achieved               Plan - 02-Nov-2016 1126    Clinical Impression Statement Pt will D/C to HEP.  Pt with improved Rt LE strength and reports difficulty with squatting and descending steps due to limited eccentric strength.  Pt has HEP in place for continued gains and will follow-up with MD as needed.     PT Next Visit Plan D/C PT to HEP   Consulted and Agree with Plan of Care Patient      Patient will benefit from skilled therapeutic intervention in order to improve the following deficits and impairments:     Visit Diagnosis: Muscle weakness (generalized)  Difficulty in walking, not elsewhere classified  Acute pain of right knee  Stiffness of right knee, not elsewhere classified       G-Codes - Nov 02, 2016 1128    Functional Assessment Tool Used (Outpatient Only) FOTO 45% limitation   Functional Limitation Mobility: Walking and moving around   Mobility: Walking and Moving Around Goal Status [phone]) At least 40 percent but less than 60 percent impaired, limited or restricted   Mobility: Walking and Moving Around Discharge Status [phone]) At least 40 percent but less than 60 percent impaired, limited or restricted      Problem List Patient Active Problem List   Diagnosis Date Noted  . Trigger index finger of left hand 09/22/2016  . 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 Diabetes ([place]) 01/08/2014  . Essential hypertension 01/08/2014  . Hyperlipidemia 01/08/2014  . Postoperative anemia due to acute blood loss 11/22/2012  . Hyponatremia 11/06/2012  . OA (osteoarthritis) of knee 11/05/2012  . Villonodular synovitis of knee 04/20/2011   PHYSICAL THERAPY DISCHARGE SUMMARY  Visits from  Start of Care: 16  Current functional level related to goals / functional outcomes: See above for current status.     Remaining deficits: See above.  Pt has HEP in place for continued gains.     Education / Equipment: HEP Plan: Patient agrees to discharge.  Patient goals were met. Patient is being discharged due to meeting the stated rehab goals.  ?????       Sigurd Sos, PT 11/02/16 11:44 AM  [facility] [address] [place], [place], [address] Phone: [phone]   Fax:  [phone]  Name: [name] MRN: [id number] Date of Birth: 27-Jun-1950

## 2016-10-09 NOTE — Progress Notes (Signed)
Cardiology Office Note   Date:  10/10/2016   ID:  [name], DOB 07-Nov-1950, MRN [id number]  PCP:  Marden NobleGates, Robert, MD  Cardiologist:   Charlton HawsPeter Seanpaul Preece, MD   No chief complaint on file.     History of Present Illness: [name] is a 66 y.o. female  Initially seen for palpitations April 2016  Referred by Dr Kevan NyGates.    Reviewed office notes from Dr Kevan NyGates 06/24/16  Dyspnea with cough Stopped advair and given Benzonatate Capsule Concerned about palpitations ? PAF   Echo done 06/06/16 was normal with EF 65% and normal PA pressure no valve disease She has had cough since January CXR ok On valsartan OSA wears CPAP with no issues. Having redo right knee surgery with Dr Rayburn MaBlackmon in May. Original Right TKR done by Alusio.    Palpitations since January. Mostly at night Fast skips no associated chest pain, dyspnea or syncope Happening more often almost every night now   Sees Dr Sharl MaKerr for thyroid and DM A1c still up around 7.7 she says thyroid has been fine    Echo reviewed: 06/06/16 EF 65-70% no valve disease normal LA size Holter: 07/06/16 with frequent PAF  Placed on xarelto, flecainide and metoprolol Myovue: reviewed normal no ischemia or infarct EF 64%  Complains of some LE edema   Past Medical History:  Diagnosis Date  . Anemia    hx of  . Arthritis   . Depression   . Diabetes mellitus ORAL MEDS  . GERD (gastroesophageal reflux disease)    occasional  . History of kidney stones   . Hyperlipidemia   . Hypertension   . Hypothyroidism   . Knee pain, right   . 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 OSA on CPAP   . Osteoarthritis   . Pinched nerve in neck   . Pneumonia    hx of  . PONV (postoperative nausea and vomiting)    for 3-4 days after general anesthesia  . Swelling of knee joint, right   . Synovitis of knee RIGHT  . Trigger finger, left    left index    Past Surgical History:  Procedure Laterality Date  . CESAREAN SECTION  X3  . KNEE ARTHROSCOPY   04/20/2011   Procedure: ARTHROSCOPY KNEE;  Surgeon: Loanne DrillingFrank V Aluisio, MD;  [facility];  Service: Orthopedics;  Laterality: Right;  WITH SYNOVECTOMY  . LEFT CARPAL TUNNEL / LEFT MIDDLE & RING FINGER TRIGGER RELEASE  08-26-2008  . LEFT SHOULDER ARTHROSCOPY W/ DEBRIDEMENT  09-09-2003  . LEFT SHOULDER ARTHROSCOPY/ LEFT THUMB TRIGGER RELEASE  02-22-2005  . PULLEY RELEASE LEFT LONG FINGER  07-14-2009  . RIGHT CARPAL TUNNEL/ RIGHT THUMB TRIGGER RELEASE'S  11-28-2006  . RIGHT SHOULDER ARTHROSCOPY W/ ROTATOR CUFF REPAIR  01-13-2004  . SHOULDER ARTHROSCOPY DISTAL CLAVICLE EXCISION AND OPEN ROTATOR CUFF REPAIR  09-07-2004   LEFT  . SHOULDER ARTHROSCOPY W/ ACROMIAL REPAIR  11-29-2005   LEFT  . TONSILLECTOMY AND ADENOIDECTOMY  child  . TOTAL KNEE ARTHROPLASTY  12-14-2009   RIGHT  . TOTAL KNEE ARTHROPLASTY Left 11/05/2012   Procedure: LEFT TOTAL KNEE ARTHROPLASTY;  Surgeon: Loanne DrillingFrank V Aluisio, MD;  Location: [facility];  Service: Orthopedics;  Laterality: Left;  . TOTAL KNEE REVISION Right 07/29/2016   Procedure: RIGHT KNEE POLY-LINER EXCHANGE;  Surgeon: Kathryne HitchBlackman, Christopher Y, MD;  Location: [facility];  Service: Orthopedics;  Laterality: Right;  . TRIGGER FINGER RELEASE Right 02/21/2013   Procedure:  RIGHT RING A-1 PULLEY RELEASE    (MINOR PROCEDURE) ;  Surgeon: Wyn Forster., MD;  Location: [facility];  Service: Orthopedics;  Laterality: Right;  . TRIGGER FINGER RELEASE Left 09/22/2016   Procedure: RELEASE TRIGGER FINGER LEFT INDEX FINGER;  Surgeon: Kathryne Hitch, MD;  Location: [facility];  Service: Orthopedics;  Laterality: Left;     Current Outpatient Prescriptions  Medication Sig Dispense Refill  . amLODipine (NORVASC) 5 MG tablet Take 5 mg by mouth every evening.     [name] atorvastatin (LIPITOR) 40 MG tablet Take 40 mg by mouth every evening.     . cholecalciferol (VITAMIN D) 1000 units tablet Take 1,000 Units by mouth every evening.     . docusate sodium  (COLACE) 100 MG capsule Take 100 mg by mouth daily as needed for mild constipation.    . DULoxetine (CYMBALTA) 60 MG capsule Take 60 mg by mouth daily.    . flecainide (TAMBOCOR) 50 MG tablet Take 1 tablet (50 mg total) by mouth 2 (two) times daily. 180 tablet 3  . indomethacin (INDOCIN) 50 MG capsule Take 50 mg by mouth 3 (three) times daily as needed (gout).    . insulin glargine (LANTUS) 100 UNIT/ML injection Inject 60 Units into the skin 2 (two) times daily.     . insulin lispro (HUMALOG) 100 UNIT/ML injection Inject 20 Units into the skin daily before supper. Hold if blood sugar is 140 or less    . levothyroxine (SYNTHROID, LEVOTHROID) 100 MCG tablet Take 100 mcg by mouth daily before breakfast.   2  . Liraglutide (VICTOZA) 18 MG/3ML SOLN Inject 1.8 mg into the skin every evening.     . Melatonin 5 MG CAPS Take 5 mg by mouth at bedtime as needed (sleep).     . metFORMIN (GLUCOPHAGE-XR) 500 MG 24 hr tablet Take 1,000 mg by mouth daily with supper.    . methocarbamol (ROBAXIN) 500 MG tablet Take 1 tablet (500 mg total) by mouth every 6 (six) hours as needed for muscle spasms. 60 tablet 0  . metoprolol tartrate (LOPRESSOR) 25 MG tablet Take 1 tablet (25 mg total) by mouth 2 (two) times daily. 180 tablet 3  . omeprazole (PRILOSEC) 20 MG capsule Take 20 mg by mouth every evening.     . sulfamethoxazole-trimethoprim (BACTRIM DS,SEPTRA DS) 800-160 MG tablet Take 1 tablet by mouth 2 (two) times daily. 20 tablet 0  . valsartan-hydrochlorothiazide (DIOVAN-HCT) 320-12.5 MG tablet Take 1 tablet by mouth daily.    Carlena Hurl 20 MG TABS tablet TAKE 1 TABLET BY MOUTH ONCE DAILY WITH SUPPER 30 tablet 11  . oxyCODONE-acetaminophen (ROXICET) 5-325 MG tablet Take 1-2 tablets by mouth every 6 (six) hours as needed. (Patient not taking: Reported on 10/10/2016) 60 tablet 0   No current facility-administered medications for this visit.     Allergies:   Actos [pioglitazone hydrochloride]; Avandia [rosiglitazone];  Tetanus toxoids; and Food    Social History:  The patient  reports that she quit smoking about 39 years ago. Her smoking use included Cigarettes. She quit after 5.00 years of use. She has never used smokeless tobacco. She reports that she does not drink alcohol or use drugs.   Family History:  The patient's family history includes Diabetes in her mother; Heart attack in her mother; Hypertension in her mother.    ROS:  Please see the history of present illness.   Otherwise, review of systems are positive for none.   All other  systems are reviewed and negative.    PHYSICAL EXAM: VS:  BP 126/70   Pulse 67   Ht 5\' 2"  (1.575 m)   Wt 256 lb (116.1 kg)   SpO2 97%   BMI 46.82 kg/m  , BMI Body mass index is 46.82 kg/m. Affect appropriate Obese white female  HEENT: normal Neck supple with no adenopathy JVP normal no bruits no thyromegaly Lungs clear with no wheezing and good diaphragmatic motion Heart:  S1/S2 no murmur, no rub, gallop or click PMI normal Abdomen: benighn, BS positve, no tenderness, no AAA no bruit.  No HSM or HJR Distal pulses intact with no bruits No edema Neuro non-focal Skin warm and dry Right TKR     EKG:   06/30/16 SR rate 70 nonspecific ST changes QT normal 412    Recent Labs: 09/22/2016: BUN 20; Creatinine, Ser 1.10; Hemoglobin 12.3; Platelets 284; Potassium 3.7; Sodium 139    Lipid Panel No results found for: CHOL, TRIG, HDL, CHOLHDL, VLDL, LDLCALC, LDLDIRECT    Wt Readings from Last 3 Encounters:  10/10/16 256 lb (116.1 kg)  09/12/16 258 lb (117 kg)  07/29/16 265 lb (120.2 kg)      Other studies Reviewed: Additional studies/ records that were reviewed today include: Notes Dr Kevan NyGates labs and Echo done in March. Myovue May 2018 and holter April 2018    ASSESSMENT AND PLAN:  1. PAF:  Improved with beta blocker and flecainide  2. DM:  Discussed low carb diet.  Target hemoglobin A1c is 6.5 or less.  Continue current medications. f/u Dr Sharl MaKerr 3.  HTN:  Well controlled.  Continue current medications and low sodium Dash type diet.   Consider changing valsartan to see if cough improves 4. Chol  Continue statin labs with primary  5 OSA:  Continue CPAP discussed benefits of weight loss    Disposition:   FU with me in 6 months      Signed, Charlton HawsPeter Renso Swett, MD  10/10/2016 3:43 PM    [facility] [address], [place], [place]  [address] Phone: [phone]; Fax: [phone]

## 2016-10-10 ENCOUNTER — Ambulatory Visit (INDEPENDENT_AMBULATORY_CARE_PROVIDER_SITE_OTHER): Payer: 59 | Admitting: Cardiovascular Disease

## 2016-10-10 ENCOUNTER — Encounter: Payer: Self-pay | Admitting: Cardiovascular Disease

## 2016-10-10 VITALS — BP 126/70 | HR 67 | Ht 62.0 in | Wt 256.0 lb

## 2016-10-10 DIAGNOSIS — I10 Essential (primary) hypertension: Secondary | ICD-10-CM | POA: Diagnosis not present

## 2016-10-10 DIAGNOSIS — I480 Paroxysmal atrial fibrillation: Secondary | ICD-10-CM | POA: Diagnosis not present

## 2016-10-10 NOTE — Patient Instructions (Signed)

## 2016-10-17 MED FILL — DULoxetine HCL 60 MG CPEP: 60 | 30 days supply | Qty: 30 | Fill #4

## 2016-10-17 MED FILL — METOPROLOL TARTRATE 25 MG T: 25 | 90 days supply | Qty: 180 | Fill #1

## 2016-10-18 MED FILL — XARELTO 20 MG TABLET: 20 | 30 days supply | Qty: 30 | Fill #1

## 2016-10-18 MED FILL — LEVOTHYROXINE 100 MCG TAB: 100 | 90 days supply | Qty: 90 | Fill #0

## 2016-10-18 MED FILL — METFORMIN HCL ER 500 MG TAB: 500 | 90 days supply | Qty: 180 | Fill #0

## 2016-10-18 MED FILL — OMEPRAZOLE 20 MG CAP: 20 | 90 days supply | Qty: 90 | Fill #0

## 2016-10-18 MED FILL — VALSARTAN-HCTZ 320-12.5 MG: 320-12.5 | 30 days supply | Qty: 30 | Fill #5

## 2016-10-21 ENCOUNTER — Other Ambulatory Visit: Payer: Self-pay | Admitting: Pharmacist

## 2016-10-21 ENCOUNTER — Encounter: Payer: Self-pay | Admitting: Pharmacist

## 2016-10-21 VITALS — BP 160/80 | HR 70 | Wt 262.2 lb

## 2016-10-21 DIAGNOSIS — E1165 Type 2 diabetes mellitus with hyperglycemia: Secondary | ICD-10-CM

## 2016-10-21 DIAGNOSIS — Z794 Long term (current) use of insulin: Principal | ICD-10-CM

## 2016-10-21 NOTE — Patient Outreach (Signed)
[facility]) Care Management  10/21/2016  [name] [birth year] [id number]  Subjective: Patient presents today for 3 month diabetes follow-up as part of the employer-sponsored Link to Wellness program.  Current diabetes regimen includes lantus 60 untis BID, humalog 20 units with supper.  Patient also continues on valsartan and atorvastatin.  Patient endorses compliance to medications but expresses frustration with pill burden. Most recent MD follow-up was 09/08/2016 with Dr. Buddy Duty (endo).  No med changes at this time.  Patient recently had surgery on finger and knee. She has completed physical therapy for knee but still feels limited 2/2 pain. States that she had lost 7 lbs but has gained some of it back. She is discouraged by accumulating medical diagnoses and more medications.   Patient reported dietary habits: Eats 3 meals/day Breakfast: two scrambled eggs,  Lunch: grapes, cheese Dinner: rotisserie chicken, spinach  Snacks: string cheese, olives Drinks: water  Patient reported exercise habits: none - limited by knee repair; walks for her job some; wants to join pool again but more expensive than previously.   Patient reports hypoglycemic events ~ 1 month ago, 60s  - corrects with juicy juice Patient denies nocturia.  Patient denies pain/burning upon urination.  Patient denies neuropathy. Patient denies visual changes. Patient reports self foot exams. No issues besides swelling x 4 days.   Patient reported self monitored blood glucose frequency: 1-2 times daily Home fasting CBG: 130-180, excursions to 126, 206 248 2 hour post-prandial/random CBG: 140-180s   Objective:  Lab Results  Component Value Date   HGBA1C 7.7 (H) 07/20/2016   Vitals:   10/21/16 1518  BP: (!) 160/80  Pulse: 70   Filed Weights   10/21/16 1518  Weight: 262 lb 3.2 oz (118.9 kg)   Lipid Panel  No results found for: CHOL, TRIG, HDL, CHOLHDL, VLDL, LDLCALC, LDLDIRECT  10 year ASCVD  risk: unable to calculate without lipid panel  Last eye exam: 10/2015 (appnt next week) Last dental exam: 08/23/2016  Assessment: Diabetes: improved controlled, limited by sedentary lifestyle - A1C 7.2% which is closer to goal of less than 7%.  - Weight is down 3 lbs from last visit with me.    Physical Activity- inadequate, limited by lack of energy and pain in knee s/p repair -Currently getting 0 minutes of exercise per week - Below goal of at least 150 minutes of moderate intensity exercise weekly  Nutrition- adequate, limited by nighttime snacking, but improved - Maintaining adherence to diabetic diet 90% of the time  Plan/Goals for Next Visit: - Counseled on s/sx hypoglycemia and appropriate treatment - Diet goal: Continue with low-carb choices, use Plate Method - Exercise goal: get back to walking at work regularly - Follow-up: exercise options covered by insurance plan - Detailed goals and action plans are listed below  Next appointment to see me is: 02/15/2017 in person  Deirdre Pippins, PharmD [facility] PGY2 Pharmacy Resident [phone]  [facility] CM Care Plan Problem One     Most Recent Value  Care Plan Problem One  Poorly controlled T2DM  Role Documenting the Problem One  Clinical Pharmacist  Care Plan for Problem One  Active  [facility] Long Term Goal   Patient will increase dietary compliance over th next 90 days patient will continue to make healthy low-carbohydrate choices for meals using the plate method as evidenced by patient report  [facility] Long Term Goal Start Date  05/09/16  [facility] Long Term Goal Met Date  10/21/16  Interventions for Problem One  Long Term Goal  Congratulated, encouraged to continue    [facility] CM Care Plan Problem Two     Most Recent Value  Care Plan Problem Two  Obesity  Role Documenting the Problem Two  Clinical Pharmacist  Care Plan for Problem Two  Active  Interventions for Problem Two Long Term Goal   Continued encouraging patient to make healthy meal choices   [facility] Long Term Goal  Patient will maintain or lose weight over next 90 days by slowly returning to previous physical conditioning when her knee was not bothering her, planning to increase walking to every day on days she works as evidenced by scale reading  [place] Term Goal Start Date  02/01/16

## 2016-10-24 ENCOUNTER — Ambulatory Visit (INDEPENDENT_AMBULATORY_CARE_PROVIDER_SITE_OTHER): Payer: 59 | Admitting: Orthopaedic Surgery

## 2016-10-24 DIAGNOSIS — M65322 Trigger finger, left index finger: Secondary | ICD-10-CM

## 2016-10-24 DIAGNOSIS — T84068D Wear of articular bearing surface of other internal prosthetic joint, subsequent encounter: Secondary | ICD-10-CM

## 2016-10-24 DIAGNOSIS — Z96659 Presence of unspecified artificial knee joint: Secondary | ICD-10-CM

## 2016-10-24 NOTE — Progress Notes (Signed)
[name] is here follow-up mainly status post a left index finger A1 pulley release done a month ago. She still having some clicking in that finger and having problems fully extending it.  On examination there is still swelling over the A1 pulley and some inflammation of the soft tissue. She can fully flex and extend but I can absolutely feel a little bit of clicking slightly distal. This certainly could be scar tissue. As far as her right knee goes, she is having some lateral tenderness after the polyethylene liner exchange but overall her symptoms of instability of improve.  We'll have her try some topical anti-inflammatories over her A1 pulley on the left hand as well as her right hand. I like see her back in one month to consider a steroid injection postop over the left index finger A1 pulley area.

## 2016-10-26 MED FILL — VICTOZA 18 MG/3 ML INJECT P: 18 | 30 days supply | Qty: 9 | Fill #0

## 2016-10-27 DIAGNOSIS — H524 Presbyopia: Secondary | ICD-10-CM | POA: Diagnosis not present

## 2016-10-27 DIAGNOSIS — H5213 Myopia, bilateral: Secondary | ICD-10-CM | POA: Diagnosis not present

## 2016-10-31 MED FILL — LANTUS 100 UNITS/ML VIAL: 100 | 75 days supply | Qty: 90 | Fill #0

## 2016-11-09 ENCOUNTER — Other Ambulatory Visit (INDEPENDENT_AMBULATORY_CARE_PROVIDER_SITE_OTHER): Payer: Self-pay | Admitting: Orthopaedic Surgery

## 2016-11-09 MED FILL — INDOMETHACIN 50 MG CAPSULE: 50 | 7 days supply | Qty: 20 | Fill #0

## 2016-11-09 MED FILL — METHOCARBAMOL 500 MG TABLET: 500 | 15 days supply | Qty: 60 | Fill #0

## 2016-11-09 NOTE — Telephone Encounter (Signed)
Please advise 

## 2016-11-10 MED FILL — DULoxetine HCL 60 MG CPEP: 60 | 30 days supply | Qty: 30 | Fill #5

## 2016-11-14 MED FILL — AMLODIPINE BESYLATE 5 MG TA: 5 | 90 days supply | Qty: 90 | Fill #2

## 2016-11-14 MED FILL — XARELTO 20 MG TABLET: 20 | 30 days supply | Qty: 30 | Fill #2

## 2016-11-15 MED FILL — VALSARTAN-HCTZ 320-12.5 MG: 320-12.5 | 30 days supply | Qty: 30 | Fill #0

## 2016-11-16 MED FILL — HUMALOG 100 UNITS/ML KWIKPE: 100 | 60 days supply | Qty: 12 | Fill #0

## 2016-11-24 ENCOUNTER — Ambulatory Visit (INDEPENDENT_AMBULATORY_CARE_PROVIDER_SITE_OTHER): Payer: 59 | Admitting: Orthopaedic Surgery

## 2016-11-24 DIAGNOSIS — M65322 Trigger finger, left index finger: Secondary | ICD-10-CM

## 2016-11-24 MED ORDER — METHYLPREDNISOLONE ACETATE 40 MG/ML IJ SUSP
40.0000 mg | INTRAMUSCULAR | Status: AC | PRN
  Administered 2016-11-24: 40 mg

## 2016-11-24 MED ORDER — LIDOCAINE HCL 1 % IJ SOLN
1.0000 mL | INTRAMUSCULAR | Status: AC | PRN
  Administered 2016-11-24: 1 mL

## 2016-11-24 MED FILL — HYDROCODON-APAP 5-325: 5-325 | 15 days supply | Qty: 60 | Fill #0

## 2016-11-24 NOTE — Progress Notes (Signed)
   Procedure Note  Patient: [name]             Date of Birth: 12-Sep-1950           MRN: [id number]             Visit Date: 11/24/2016  Procedures: Visit Diagnoses: Trigger index finger of left hand  Hand/UE Inj Date/Time: 11/24/2016 4:03 PM Performed by: Kathryne HitchBLACKMAN, Ritaj Dullea Y Authorized by: Kathryne HitchBLACKMAN, Russ Looper Y   Condition: trigger finger   Location:  Index finger Site:  L index A1 Medications:  40 mg methylPREDNISolone acetate 40 MG/ML; 1 mL lidocaine 1 %   The patient is almost 8 weeks now status post an A1 pulley release of her left index finger. She's been having still some clicking in that finger I did find abundant scar tissue. She has a diabetic and said her blood glucose in the morning is been about 150 and she's not pleased about that. On examination there is no active triggering but I can feel a slight click over the surgical site. There is definitely inflamed tissue as well.  I recommended a diluted injection of a steroid in this area and she was agreeable to this. She tolerated it well. I'll see her back in 4 weeks to see how she doing overall. I still her massage this area as well.

## 2016-12-01 MED FILL — VICTOZA 18 MG/3 ML INJECT P: 18 | 30 days supply | Qty: 9 | Fill #1

## 2016-12-16 MED FILL — BD UF INS SYR 1 ML 31GX5/16: 31G X 5/16" | 90 days supply | Qty: 200 | Fill #0

## 2016-12-16 MED FILL — DULoxetine HCL 60 MG CPEP: 60 | 30 days supply | Qty: 30 | Fill #0

## 2016-12-16 MED FILL — VALSARTAN-HCTZ 320-12.5 MG: 320-12.5 | 30 days supply | Qty: 30 | Fill #1

## 2016-12-19 DIAGNOSIS — M65321 Trigger finger, right index finger: Secondary | ICD-10-CM | POA: Diagnosis not present

## 2016-12-22 ENCOUNTER — Ambulatory Visit (INDEPENDENT_AMBULATORY_CARE_PROVIDER_SITE_OTHER): Payer: 59

## 2016-12-22 ENCOUNTER — Ambulatory Visit (INDEPENDENT_AMBULATORY_CARE_PROVIDER_SITE_OTHER): Payer: 59 | Admitting: Orthopaedic Surgery

## 2016-12-22 DIAGNOSIS — M65322 Trigger finger, left index finger: Secondary | ICD-10-CM

## 2016-12-22 DIAGNOSIS — M25561 Pain in right knee: Secondary | ICD-10-CM | POA: Diagnosis not present

## 2016-12-22 DIAGNOSIS — T84068D Wear of articular bearing surface of other internal prosthetic joint, subsequent encounter: Secondary | ICD-10-CM

## 2016-12-22 DIAGNOSIS — Z96659 Presence of unspecified artificial knee joint: Secondary | ICD-10-CM

## 2016-12-22 NOTE — Progress Notes (Signed)
The patient is well-known to me. 5 months ago we performed a polyethylene liner exchange for right knee replacement. We upsized her polyethylene the knee is felt more stable since then. However she has a recent fall knee is been hurting her hitting it yesterday. We also performed recently a trigger finger release and this was just 3 months ago. This was of the index finger. She still having a lot of clicking that finger but no pain at all.  On exam his her left hand incisions healed nicely is no redness. I can feel definitely a clicking that she feels as well. There is no active triggering in this is not painful to her. The finger is neurovascular intact. Examination of her right knee shows excellent range of motion of the knee and it feels stable with no complicating features. There is no instability of the knee.  2 views of her right knee are obtained and show well-seated total knee arthroplasty with good alignment. There is no evidence of fracture or acute findings. There is no of this loosening.  This point she'll follow-up as needed. All questions concerns wrist were addressed. I do feel that the right knee will continue to get better for her. As far as her index finger goes, if the clicking becomes painful problematic and she develops triggering again I recommend a repeat surgery on this area.

## 2016-12-26 ENCOUNTER — Other Ambulatory Visit: Payer: Self-pay | Admitting: Internal Medicine

## 2016-12-26 ENCOUNTER — Ambulatory Visit
Admission: RE | Admit: 2016-12-26 | Discharge: 2016-12-26 | Disposition: A | Discharge status: Home / Self Care | Payer: 59 | Source: Ambulatory Visit | Attending: Internal Medicine | Admitting: Internal Medicine

## 2016-12-26 DIAGNOSIS — J90 Pleural effusion, not elsewhere classified: Secondary | ICD-10-CM | POA: Diagnosis not present

## 2016-12-26 DIAGNOSIS — R091 Pleurisy: Secondary | ICD-10-CM

## 2016-12-26 DIAGNOSIS — R0789 Other chest pain: Secondary | ICD-10-CM | POA: Diagnosis not present

## 2016-12-26 MED FILL — KETOROLAC 10 MG TABLET: 10 | 5 days supply | Qty: 15 | Fill #0

## 2016-12-27 MED FILL — XARELTO 20 MG TABLET: 20 | 30 days supply | Qty: 30 | Fill #3

## 2016-12-27 MED FILL — FLECAINIDE ACETATE 50 MG TA: 50 | 90 days supply | Qty: 180 | Fill #2

## 2016-12-28 MED FILL — ATORVASTATIN 40 MG TABLET: 40 | 90 days supply | Qty: 90 | Fill #2

## 2017-01-11 ENCOUNTER — Other Ambulatory Visit: Payer: Self-pay | Admitting: Internal Medicine

## 2017-01-11 DIAGNOSIS — M109 Gout, unspecified: Secondary | ICD-10-CM | POA: Diagnosis not present

## 2017-01-11 DIAGNOSIS — R0602 Shortness of breath: Secondary | ICD-10-CM | POA: Diagnosis not present

## 2017-01-11 DIAGNOSIS — Z0000 Encounter for general adult medical examination without abnormal findings: Secondary | ICD-10-CM | POA: Diagnosis not present

## 2017-01-11 DIAGNOSIS — I4891 Unspecified atrial fibrillation: Secondary | ICD-10-CM | POA: Diagnosis not present

## 2017-01-11 DIAGNOSIS — Z1231 Encounter for screening mammogram for malignant neoplasm of breast: Secondary | ICD-10-CM

## 2017-01-11 DIAGNOSIS — Z8639 Personal history of other endocrine, nutritional and metabolic disease: Secondary | ICD-10-CM | POA: Diagnosis not present

## 2017-01-11 DIAGNOSIS — R091 Pleurisy: Secondary | ICD-10-CM | POA: Diagnosis not present

## 2017-01-11 DIAGNOSIS — R600 Localized edema: Secondary | ICD-10-CM | POA: Diagnosis not present

## 2017-01-11 DIAGNOSIS — Z794 Long term (current) use of insulin: Secondary | ICD-10-CM | POA: Diagnosis not present

## 2017-01-11 DIAGNOSIS — I10 Essential (primary) hypertension: Secondary | ICD-10-CM | POA: Diagnosis not present

## 2017-01-11 DIAGNOSIS — E113299 Type 2 diabetes mellitus with mild nonproliferative diabetic retinopathy without macular edema, unspecified eye: Secondary | ICD-10-CM | POA: Diagnosis not present

## 2017-01-11 DIAGNOSIS — M179 Osteoarthritis of knee, unspecified: Secondary | ICD-10-CM | POA: Diagnosis not present

## 2017-01-11 MED FILL — HYDROCHLOROTHIAZIDE 12.5 MG: 12.5 | 90 days supply | Qty: 90 | Fill #0

## 2017-01-11 MED FILL — VALSARTAN 320 MG TABLET: 320 | 30 days supply | Qty: 30 | Fill #0

## 2017-01-12 MED FILL — HUMALOG 100 UNITS/ML KWIKPE: 100 | 60 days supply | Qty: 12 | Fill #1

## 2017-01-12 MED FILL — LEVOTHYROXINE 112 MCG TAB: 112 | 90 days supply | Qty: 90 | Fill #0

## 2017-01-12 MED FILL — VICTOZA 18 MG/3 ML INJECT P: 18 | 30 days supply | Qty: 9 | Fill #2

## 2017-01-12 MED FILL — LANTUS 100 UNITS/ML VIAL: 100 | 75 days supply | Qty: 90 | Fill #1

## 2017-01-15 MED FILL — METOPROLOL TARTRATE 25 MG T: 25 | 90 days supply | Qty: 180 | Fill #2

## 2017-01-15 MED FILL — OMEPRAZOLE 20 MG CAP: 20 | 90 days supply | Qty: 90 | Fill #1

## 2017-01-15 MED FILL — DULoxetine HCL 60 MG CPEP: 60 | 30 days supply | Qty: 30 | Fill #1

## 2017-01-16 MED FILL — METFORMIN HCL ER 500 MG TAB: 500 | 90 days supply | Qty: 180 | Fill #1

## 2017-01-17 ENCOUNTER — Encounter (HOSPITAL_BASED_OUTPATIENT_CLINIC_OR_DEPARTMENT_OTHER)
Admission: RE | Admit: 2017-01-17 | Discharge: 2017-01-17 | Disposition: A | Discharge status: Home / Self Care | Payer: 59 | Source: Ambulatory Visit | Attending: Orthopaedic Surgery | Admitting: Orthopaedic Surgery

## 2017-01-17 ENCOUNTER — Other Ambulatory Visit: Payer: Self-pay

## 2017-01-17 ENCOUNTER — Encounter (HOSPITAL_BASED_OUTPATIENT_CLINIC_OR_DEPARTMENT_OTHER): Payer: Self-pay | Admitting: *Deleted

## 2017-01-17 ENCOUNTER — Telehealth: Payer: Self-pay

## 2017-01-17 DIAGNOSIS — M1990 Unspecified osteoarthritis, unspecified site: Secondary | ICD-10-CM | POA: Diagnosis not present

## 2017-01-17 DIAGNOSIS — E119 Type 2 diabetes mellitus without complications: Secondary | ICD-10-CM | POA: Diagnosis not present

## 2017-01-17 DIAGNOSIS — E039 Hypothyroidism, unspecified: Secondary | ICD-10-CM | POA: Diagnosis not present

## 2017-01-17 DIAGNOSIS — G4730 Sleep apnea, unspecified: Secondary | ICD-10-CM | POA: Diagnosis not present

## 2017-01-17 DIAGNOSIS — I4891 Unspecified atrial fibrillation: Secondary | ICD-10-CM | POA: Diagnosis not present

## 2017-01-17 DIAGNOSIS — Z87891 Personal history of nicotine dependence: Secondary | ICD-10-CM | POA: Diagnosis not present

## 2017-01-17 DIAGNOSIS — Z794 Long term (current) use of insulin: Secondary | ICD-10-CM | POA: Diagnosis not present

## 2017-01-17 DIAGNOSIS — Z888 Allergy status to other drugs, medicaments and biological substances status: Secondary | ICD-10-CM | POA: Diagnosis not present

## 2017-01-17 DIAGNOSIS — E785 Hyperlipidemia, unspecified: Secondary | ICD-10-CM | POA: Diagnosis not present

## 2017-01-17 DIAGNOSIS — Z01812 Encounter for preprocedural laboratory examination: Secondary | ICD-10-CM | POA: Diagnosis not present

## 2017-01-17 DIAGNOSIS — F329 Major depressive disorder, single episode, unspecified: Secondary | ICD-10-CM | POA: Diagnosis not present

## 2017-01-17 DIAGNOSIS — Z6841 Body Mass Index (BMI) 40.0 and over, adult: Secondary | ICD-10-CM | POA: Diagnosis not present

## 2017-01-17 DIAGNOSIS — I10 Essential (primary) hypertension: Secondary | ICD-10-CM | POA: Diagnosis not present

## 2017-01-17 DIAGNOSIS — M65321 Trigger finger, right index finger: Secondary | ICD-10-CM | POA: Diagnosis not present

## 2017-01-17 DIAGNOSIS — Z7901 Long term (current) use of anticoagulants: Secondary | ICD-10-CM | POA: Diagnosis not present

## 2017-01-17 DIAGNOSIS — K219 Gastro-esophageal reflux disease without esophagitis: Secondary | ICD-10-CM | POA: Diagnosis not present

## 2017-01-17 DIAGNOSIS — Z96651 Presence of right artificial knee joint: Secondary | ICD-10-CM | POA: Diagnosis not present

## 2017-01-17 LAB — BASIC METABOLIC PANEL
Anion gap: 7 (ref 5–15)
BUN: 29 mg/dL — ABNORMAL HIGH (ref 6–20)
CO2: 26 mmol/L (ref 22–32)
Calcium: 9.1 mg/dL (ref 8.9–10.3)
Chloride: 103 mmol/L (ref 101–111)
Creatinine, Ser: 1.1 mg/dL — ABNORMAL HIGH (ref 0.44–1.00)
GFR calc Af Amer: 59 mL/min — ABNORMAL LOW (ref >60)
GFR calc non Af Amer: 51 mL/min — ABNORMAL LOW (ref >60)
Glucose, Bld: 214 mg/dL — ABNORMAL HIGH (ref 65–99)
Potassium: 3.9 mmol/L (ref 3.5–5.1)
Sodium: 136 mmol/L (ref 135–145)

## 2017-01-17 NOTE — Progress Notes (Signed)
   01/17/17 1003  OBSTRUCTIVE SLEEP APNEA  Have you ever been diagnosed with sleep apnea through a sleep study? Yes  If yes, do you have and use a CPAP or BPAP machine every night? 1  Do you know the presssure settings on your maching? Yes  Do you snore loudly (loud enough to be heard through closed doors)?  1  Do you often feel tired, fatigued, or sleepy during the daytime (such as falling asleep during driving or talking to someone)? 0  Has anyone observed you stop breathing during your sleep? 0  Do you have, or are you being treated for high blood pressure? 1  BMI more than 35 kg/m2? 1  Age > 50 (1-yes) 1  Neck circumference greater than:Female 16 inches or larger, Female 17inches or larger? 0  Female Gender (Yes=1) 0  Obstructive Sleep Apnea Score 4

## 2017-01-17 NOTE — Telephone Encounter (Signed)
   [facility] Pre-operative Risk Assessment    Request for surgical clearance:  1. What type of surgery is being performed? Right index trigger finger   2. When is this surgery scheduled? 01/20/2017  3. Are there any medications that need to be held prior to surgery and how long? May we stop xarelto on 01/17/2017? Please advise.   4. Practice name and name of physician performing surgery? [facility]. Dr. Melrose Nakayama.  5. What is your office phone and fax number? Phone: [phone]. Fax: [phone]. ATTNSandi Raveling [phone].   6. Anesthesia type (None, local, MAC, general) ?  Not specified.   [name] 01/17/2017, 5:17 PM  _________________________________________________________________   (provider comments below)

## 2017-01-17 NOTE — Progress Notes (Signed)
Anesthesia consult per Dr. Ellender, will proceed with surgery as scheduled.  

## 2017-01-18 NOTE — H&P (Signed)
[name] is an 66 y.o. female.   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  She has had this injected back in January and it did not help for very long.  She would like it released.  She actually had a left index trigger finger release by Dr. Ninfa Linden in July of this year.  Radiographs:  X-rays that were ordered, performed, and interpreted by me today included 3 views of the right hand.  I see no significant degenerative change about her index finger.  Past Medical History:  Diagnosis Date  . Anemia    hx of  . Arthritis   . Depression   . Diabetes mellitus ORAL MEDS  . Dyspnea    with minimal exertion-deconditioned  . Dysrhythmia    a-fib  . GERD (gastroesophageal reflux disease)    occasional  . History of kidney stones   . Hyperlipidemia   . Hypertension   . Hypothyroidism   . Knee pain, right   . 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 OSA on CPAP    uses CPAP nightly  . Osteoarthritis   . Pinched nerve in neck   . Pneumonia    hx of  . PONV (postoperative nausea and vomiting)    for 3-4 days after general anesthesia  . Swelling of knee joint, right   . Synovitis of knee RIGHT  . Trigger finger, left    left index    Past Surgical History:  Procedure Laterality Date  . CESAREAN SECTION  X3  . LEFT CARPAL TUNNEL / LEFT MIDDLE & RING FINGER TRIGGER RELEASE  08-26-2008  . LEFT SHOULDER ARTHROSCOPY W/ DEBRIDEMENT  09-09-2003  . LEFT SHOULDER ARTHROSCOPY/ LEFT THUMB TRIGGER RELEASE  02-22-2005  . PULLEY RELEASE LEFT LONG FINGER  07-14-2009  . RIGHT CARPAL TUNNEL/ RIGHT THUMB TRIGGER RELEASE'S  11-28-2006  . RIGHT SHOULDER ARTHROSCOPY W/ ROTATOR CUFF REPAIR  01-13-2004  . SHOULDER ARTHROSCOPY DISTAL CLAVICLE EXCISION AND OPEN ROTATOR CUFF REPAIR  09-07-2004   LEFT  . SHOULDER ARTHROSCOPY W/ ACROMIAL REPAIR  11-29-2005   LEFT  . TONSILLECTOMY AND ADENOIDECTOMY  child  . TOTAL KNEE ARTHROPLASTY   12-14-2009   RIGHT    Family History  Problem Relation Age of Onset  . Diabetes Mother   . Hypertension Mother   . Heart attack Mother    Social History:  reports that she quit smoking about 39 years ago. Her smoking use included cigarettes. She quit after 5.00 years of use. she has never used smokeless tobacco. She reports that she does not drink alcohol or use drugs.  Allergies:  Allergies  Allergen Reactions  . Actos [Pioglitazone Hydrochloride] Swelling and Other (See Comments)    Numbness in lips, HEADACHES SWELLING REACTION UNSPECIFIED   . Avandia [Rosiglitazone] Swelling    Numbness in lips, headaches  SWELLING REACTION UNSPECIFIED   . Tetanus Toxoids Other (See Comments)    Arm swelling, fainted, ?Respiratory distress  (horse serum)  . Food Nausea Only    Eggplant    No medications prior to admission.    Results for orders placed or performed during the hospital encounter of 01/20/17 (from the past 48 hour(s))  Basic metabolic panel     Status: Abnormal   Collection Time: 01/17/17  2:59 PM  Result Value Ref Range   Sodium 136 135 - 145 mmol/L   Potassium 3.9 3.5 - 5.1 mmol/L   Chloride 103  101 - 111 mmol/L   CO2 26 22 - 32 mmol/L   Glucose, Bld 214 (H) 65 - 99 mg/dL   BUN 29 (H) 6 - 20 mg/dL   Creatinine, Ser 1.10 (H) 0.44 - 1.00 mg/dL   Calcium 9.1 8.9 - 10.3 mg/dL   GFR calc non Af Amer 51 (L) >60 mL/min   GFR calc Af Amer 59 (L) >60 mL/min    Comment: (NOTE) The eGFR has been calculated using the CKD EPI equation. This calculation has not been validated in all clinical situations. eGFR's persistently <60 mL/min signify possible Chronic Kidney Disease.    Anion gap 7 5 - 15   No results found.  Review of Systems  Musculoskeletal: Positive for joint pain.       Right index finger  All other systems reviewed and are negative.   Height '5\' 2"'$  (1.575 m), weight 122 kg (269 lb). Physical Exam  Constitutional: She is oriented to person, place, and  time. She appears well-developed and well-nourished.  HENT:  Head: Normocephalic and atraumatic.  Eyes: Pupils are equal, round, and reactive to light.  Neck: Normal range of motion.  Cardiovascular: Normal rate and regular rhythm.  Respiratory: Effort normal.  GI: Soft.  Musculoskeletal: Normal range of motion.  Right index finger does actively trigger.  She has some pain along the flexor tendon sheath.  She has good refill to the tip of the finger.  No other fingers are currently triggering.  Neurological: She is alert and oriented to person, place, and time.  Skin: Skin is warm and dry.  Psychiatric: She has a normal mood and affect. Her behavior is normal. Judgment and thought content normal.     Assessment/Plan Assessment: Right index trigger finger  Plan: Mikailah would like her trigger finger released.  I am certainly honored to be her Psychologist, sport and exercise of choice this time.  We will try to set this up for sometime in the near future at a [facility] facility where she works.  I reviewed risk of anesthesia, infection, neurovascular injury.   Jaidynn Balster, Larwance Sachs, PA-C 01/18/2017, 8:45 AM

## 2017-01-18 NOTE — Telephone Encounter (Signed)
Pt takes Xarelto for afib with CHADS2VASc of 4 (HTN, DM, age, sex). Renal function is normal. Ok to hold Xarelto for 1-2 days prior to procedure. Do not need 3 day hold for Xarelto since it is cleared from the body in 24 hours.

## 2017-01-20 ENCOUNTER — Ambulatory Visit (HOSPITAL_BASED_OUTPATIENT_CLINIC_OR_DEPARTMENT_OTHER): Payer: 59 | Admitting: Anesthesiology

## 2017-01-20 ENCOUNTER — Encounter (HOSPITAL_BASED_OUTPATIENT_CLINIC_OR_DEPARTMENT_OTHER)
Admission: RE | Disposition: A | Discharge status: Home / Self Care | Payer: Self-pay | Source: Ambulatory Visit | Attending: Orthopaedic Surgery

## 2017-01-20 ENCOUNTER — Ambulatory Visit (HOSPITAL_BASED_OUTPATIENT_CLINIC_OR_DEPARTMENT_OTHER)
Admission: RE | Admit: 2017-01-20 | Discharge: 2017-01-20 | Disposition: A | Discharge status: Home / Self Care | Payer: 59 | Source: Ambulatory Visit | Attending: Orthopaedic Surgery | Admitting: Orthopaedic Surgery

## 2017-01-20 ENCOUNTER — Encounter (HOSPITAL_BASED_OUTPATIENT_CLINIC_OR_DEPARTMENT_OTHER): Payer: Self-pay | Admitting: Anesthesiology

## 2017-01-20 DIAGNOSIS — E039 Hypothyroidism, unspecified: Secondary | ICD-10-CM | POA: Insufficient documentation

## 2017-01-20 DIAGNOSIS — F329 Major depressive disorder, single episode, unspecified: Secondary | ICD-10-CM | POA: Insufficient documentation

## 2017-01-20 DIAGNOSIS — K219 Gastro-esophageal reflux disease without esophagitis: Secondary | ICD-10-CM | POA: Diagnosis not present

## 2017-01-20 DIAGNOSIS — I4891 Unspecified atrial fibrillation: Secondary | ICD-10-CM | POA: Diagnosis not present

## 2017-01-20 DIAGNOSIS — M65321 Trigger finger, right index finger: Secondary | ICD-10-CM | POA: Diagnosis not present

## 2017-01-20 DIAGNOSIS — Z7901 Long term (current) use of anticoagulants: Secondary | ICD-10-CM | POA: Insufficient documentation

## 2017-01-20 DIAGNOSIS — I10 Essential (primary) hypertension: Secondary | ICD-10-CM | POA: Diagnosis not present

## 2017-01-20 DIAGNOSIS — E119 Type 2 diabetes mellitus without complications: Secondary | ICD-10-CM | POA: Diagnosis not present

## 2017-01-20 DIAGNOSIS — M1990 Unspecified osteoarthritis, unspecified site: Secondary | ICD-10-CM | POA: Insufficient documentation

## 2017-01-20 DIAGNOSIS — E785 Hyperlipidemia, unspecified: Secondary | ICD-10-CM | POA: Insufficient documentation

## 2017-01-20 DIAGNOSIS — Z96651 Presence of right artificial knee joint: Secondary | ICD-10-CM | POA: Insufficient documentation

## 2017-01-20 DIAGNOSIS — Z01812 Encounter for preprocedural laboratory examination: Secondary | ICD-10-CM | POA: Insufficient documentation

## 2017-01-20 DIAGNOSIS — Z888 Allergy status to other drugs, medicaments and biological substances status: Secondary | ICD-10-CM | POA: Insufficient documentation

## 2017-01-20 DIAGNOSIS — Z87891 Personal history of nicotine dependence: Secondary | ICD-10-CM | POA: Insufficient documentation

## 2017-01-20 DIAGNOSIS — Z6841 Body Mass Index (BMI) 40.0 and over, adult: Secondary | ICD-10-CM | POA: Insufficient documentation

## 2017-01-20 DIAGNOSIS — Z794 Long term (current) use of insulin: Secondary | ICD-10-CM | POA: Insufficient documentation

## 2017-01-20 DIAGNOSIS — G4730 Sleep apnea, unspecified: Secondary | ICD-10-CM | POA: Insufficient documentation

## 2017-01-20 HISTORY — PX: TRIGGER FINGER RELEASE: SHX641

## 2017-01-20 HISTORY — DX: Cardiac arrhythmia, unspecified: I49.9

## 2017-01-20 HISTORY — DX: Dyspnea, unspecified: R06.00

## 2017-01-20 LAB — GLUCOSE, CAPILLARY
Glucose-Capillary: 109 mg/dL — ABNORMAL HIGH (ref 65–99)
Glucose-Capillary: 114 mg/dL — ABNORMAL HIGH (ref 65–99)

## 2017-01-20 SURGERY — RELEASE, A1 PULLEY, FOR TRIGGER FINGER
Anesthesia: Monitor Anesthesia Care | Site: Hand | Laterality: Right

## 2017-01-20 MED ORDER — CHLORHEXIDINE GLUCONATE 4 % EX LIQD: 60.0000 mL | Freq: Once | CUTANEOUS | Status: DC

## 2017-01-20 MED ORDER — PROPOFOL 10 MG/ML IV BOLUS
INTRAVENOUS | Status: AC
  Filled 2017-01-20: qty 20

## 2017-01-20 MED ORDER — FENTANYL CITRATE (PF) 100 MCG/2ML IJ SOLN
50.0000 ug | INTRAMUSCULAR | Status: DC | PRN
  Administered 2017-01-20: 50 ug via INTRAVENOUS

## 2017-01-20 MED ORDER — BUPIVACAINE-EPINEPHRINE 0.25% -1:200000 IJ SOLN
INTRAMUSCULAR | Status: AC
  Filled 2017-01-20: qty 1

## 2017-01-20 MED ORDER — BUPIVACAINE HCL (PF) 0.25 % IJ SOLN
INTRAMUSCULAR | Status: AC
  Filled 2017-01-20: qty 30

## 2017-01-20 MED ORDER — LIDOCAINE HCL (PF) 1 % IJ SOLN
INTRAMUSCULAR | Status: DC | PRN
  Administered 2017-01-20: 3.5 mL

## 2017-01-20 MED ORDER — ONDANSETRON HCL 4 MG/2ML IJ SOLN: 4.0000 mg | Freq: Once | INTRAMUSCULAR | Status: DC | PRN

## 2017-01-20 MED ORDER — MIDAZOLAM HCL 2 MG/2ML IJ SOLN
1.0000 mg | INTRAMUSCULAR | Status: DC | PRN
  Administered 2017-01-20: 1 mg via INTRAVENOUS

## 2017-01-20 MED ORDER — LIDOCAINE HCL (PF) 1 % IJ SOLN
INTRAMUSCULAR | Status: AC
  Filled 2017-01-20: qty 30

## 2017-01-20 MED ORDER — FENTANYL CITRATE (PF) 100 MCG/2ML IJ SOLN
INTRAMUSCULAR | Status: AC
  Filled 2017-01-20: qty 2

## 2017-01-20 MED ORDER — DEXAMETHASONE SODIUM PHOSPHATE 10 MG/ML IJ SOLN
INTRAMUSCULAR | Status: AC
  Filled 2017-01-20: qty 1

## 2017-01-20 MED ORDER — LACTATED RINGERS IV SOLN
INTRAVENOUS | Status: DC
  Administered 2017-01-20: 14:00:00 via INTRAVENOUS

## 2017-01-20 MED ORDER — CEFAZOLIN SODIUM-DEXTROSE 2-4 GM/100ML-% IV SOLN
2.0000 g | INTRAVENOUS | Status: DC
  Administered 2017-01-20: 3 g via INTRAVENOUS

## 2017-01-20 MED ORDER — CEFAZOLIN SODIUM 10 G IJ SOLR: 3.0000 g | Freq: Once | INTRAMUSCULAR | Status: DC

## 2017-01-20 MED ORDER — BUPIVACAINE-EPINEPHRINE (PF) 0.25% -1:200000 IJ SOLN
INTRAMUSCULAR | Status: DC | PRN
  Administered 2017-01-20: 3.5 mL

## 2017-01-20 MED ORDER — ONDANSETRON HCL 4 MG/2ML IJ SOLN
INTRAMUSCULAR | Status: DC | PRN
  Administered 2017-01-20: 4 mg via INTRAVENOUS

## 2017-01-20 MED ORDER — ONDANSETRON HCL 4 MG/2ML IJ SOLN
INTRAMUSCULAR | Status: AC
Start: 2017-01-20 — End: 2017-01-20
  Filled 2017-01-20: qty 2

## 2017-01-20 MED ORDER — SCOPOLAMINE 1 MG/3DAYS TD PT72: 1.0000 | MEDICATED_PATCH | Freq: Once | TRANSDERMAL | Status: DC | PRN

## 2017-01-20 MED ORDER — FENTANYL CITRATE (PF) 100 MCG/2ML IJ SOLN: 25.0000 ug | INTRAMUSCULAR | Status: DC | PRN

## 2017-01-20 MED ORDER — CEFAZOLIN SODIUM-DEXTROSE 2-4 GM/100ML-% IV SOLN
INTRAVENOUS | Status: AC
  Filled 2017-01-20: qty 200

## 2017-01-20 MED ORDER — MIDAZOLAM HCL 2 MG/2ML IJ SOLN
INTRAMUSCULAR | Status: AC
  Filled 2017-01-20: qty 2

## 2017-01-20 MED ORDER — HYDROCODONE-ACETAMINOPHEN 5-325 MG PO TABS: 1.0000 | ORAL_TABLET | ORAL | 0 refills | Status: DC | PRN

## 2017-01-20 MED FILL — HYDROCODON-APAP 5-325: 5-325 | 3 days supply | Qty: 30 | Fill #0

## 2017-01-20 SURGICAL SUPPLY — 39 items
BLADE MINI RND TIP GREEN BEAV (BLADE) IMPLANT
BLADE SURG 15 STRL LF DISP TIS (BLADE) ×1 IMPLANT
BLADE SURG 15 STRL SS (BLADE) ×2
BNDG CMPR 9X4 STRL LF SNTH (GAUZE/BANDAGES/DRESSINGS)
BNDG ELASTIC 2X5.8 VLCR STR LF (GAUZE/BANDAGES/DRESSINGS) ×2 IMPLANT
BNDG ESMARK 4X9 LF (GAUZE/BANDAGES/DRESSINGS) IMPLANT
BNDG GAUZE ELAST 4 BULKY (GAUZE/BANDAGES/DRESSINGS) ×2 IMPLANT
COVER BACK TABLE 60X90IN (DRAPES) ×2 IMPLANT
COVER MAYO STAND STRL (DRAPES) ×2 IMPLANT
CUFF TOURNIQUET SINGLE 18IN (TOURNIQUET CUFF) IMPLANT
DRAPE EXTREMITY T 121X128X90 (DRAPE) ×2 IMPLANT
DRAPE U-SHAPE 47X51 STRL (DRAPES) IMPLANT
DRSG EMULSION OIL 3X3 NADH (GAUZE/BANDAGES/DRESSINGS) ×2 IMPLANT
DURAPREP 26ML APPLICATOR (WOUND CARE) ×2 IMPLANT
ELECT NEEDLE TIP 2.8 STRL (NEEDLE) IMPLANT
ELECT REM PT RETURN 9FT ADLT (ELECTROSURGICAL) ×2
ELECTRODE REM PT RTRN 9FT ADLT (ELECTROSURGICAL) ×1 IMPLANT
GAUZE SPONGE 4X4 12PLY STRL (GAUZE/BANDAGES/DRESSINGS) ×2 IMPLANT
GLOVE BIO SURGEON STRL SZ8 (GLOVE) ×4 IMPLANT
GLOVE BIOGEL PI IND STRL 8 (GLOVE) ×2 IMPLANT
GLOVE BIOGEL PI INDICATOR 8 (GLOVE) ×2
GOWN STRL REUS W/ TWL LRG LVL3 (GOWN DISPOSABLE) ×1 IMPLANT
GOWN STRL REUS W/ TWL XL LVL3 (GOWN DISPOSABLE) ×2 IMPLANT
GOWN STRL REUS W/TWL LRG LVL3 (GOWN DISPOSABLE) ×1
GOWN STRL REUS W/TWL XL LVL3 (GOWN DISPOSABLE) ×2
LOOP VESSEL MAXI BLUE (MISCELLANEOUS) IMPLANT
NEEDLE HYPO 25X1 1.5 SAFETY (NEEDLE) IMPLANT
NS IRRIG 1000ML POUR BTL (IV SOLUTION) ×2 IMPLANT
PACK BASIN DAY SURGERY FS (CUSTOM PROCEDURE TRAY) ×2 IMPLANT
PENCIL BUTTON HOLSTER BLD 10FT (ELECTRODE) IMPLANT
STOCKINETTE 4X48 STRL (DRAPES) ×2 IMPLANT
SUT ETHILON 4 0 PS 2 18 (SUTURE) ×2 IMPLANT
SUT VIC AB 4-0 P-3 18XBRD (SUTURE) IMPLANT
SUT VIC AB 4-0 P3 18 (SUTURE)
SYR BULB 3OZ (MISCELLANEOUS) IMPLANT
SYR CONTROL 10ML LL (SYRINGE) IMPLANT
TOWEL OR 17X24 6PK STRL BLUE (TOWEL DISPOSABLE) ×2 IMPLANT
TOWEL OR NON WOVEN STRL DISP B (DISPOSABLE) ×2 IMPLANT
UNDERPAD 30X30 (UNDERPADS AND DIAPERS) ×2 IMPLANT

## 2017-01-20 NOTE — Transfer of Care (Signed)
Immediate Anesthesia Transfer of Care Note  Patient: [name]  Procedure(s) Performed: RELEASE TRIGGER FINGER/A-1 PULLEY RIGHT INDEX FINGER (Right Hand)  Patient Location: PACU  Anesthesia Type:MAC  Level of Consciousness: awake, alert  and oriented  Airway & Oxygen Therapy: Patient Spontanous Breathing  Post-op Assessment: Report given to RN and Post -op Vital signs reviewed and stable  Post vital signs: Reviewed and stable  Last Vitals:  Vitals:   01/20/17 1208  BP: (!) 162/71  Pulse: 72  Resp: 18  Temp: 36.9 C  SpO2: 98%    Last Pain:  Vitals:   01/20/17 1208  TempSrc: Oral  PainSc: 2       Patients Stated Pain Goal: 1 (03/15/70 5366)  Complications: No apparent anesthesia complications

## 2017-01-20 NOTE — Telephone Encounter (Signed)
Patient already had procedure. Clearance not needed now

## 2017-01-20 NOTE — Anesthesia Postprocedure Evaluation (Signed)
Anesthesia Post Note  Patient: BALEY SHANDS  Procedure(s) Performed: RELEASE TRIGGER FINGER/A-1 PULLEY RIGHT INDEX FINGER (Right Hand)     Patient location during evaluation: PACU Anesthesia Type: MAC Level of consciousness: awake and alert Pain management: pain level controlled Vital Signs Assessment: post-procedure vital signs reviewed and stable Respiratory status: spontaneous breathing, nonlabored ventilation, respiratory function stable and patient connected to nasal cannula oxygen Cardiovascular status: stable and blood pressure returned to baseline Postop Assessment: no apparent nausea or vomiting Anesthetic complications: no Comments: Minimal sedation, pt alert and talking     Last Vitals:  Vitals:   01/20/17 1413 01/20/17 1415  BP:  (!) 162/69  Pulse: 78 76  Resp: (!) 23 18  Temp: 36.7 C   SpO2: 94% 97%    Last Pain:  Vitals:   01/20/17 1415  TempSrc:   PainSc: 0-No pain                 Effie Berkshire

## 2017-01-20 NOTE — Discharge Instructions (Signed)

## 2017-01-20 NOTE — Telephone Encounter (Signed)
    Chart reviewed as part of pre-operative protocol coverage. Seems like patient has surgery today. Called the patient to review symptoms however no one has picked up the phone.   Please find out timing of surgery.   LovellBhagat,Claud Gowan, PA  01/20/2017, 1:41 PM

## 2017-01-20 NOTE — Interval H&P Note (Signed)
History and Physical Interval Note:  01/20/2017 2:03 PM  [name]  has presented today for surgery, with the diagnosis of RIGHT INDEX FINGER TRIGGER FIGER  The various methods of treatment have been discussed with the patient and family. After consideration of risks, benefits and other options for treatment, the patient has consented to  Procedure(s): RELEASE TRIGGER FINGER/A-1 PULLEY RIGHT INDEX FINGER (Right) as a surgical intervention .  The patient's history has been reviewed, patient examined, no change in status, stable for surgery.  I have reviewed the patient's chart and labs.  Questions were answered to the patient's satisfaction.     Arion Shankles G

## 2017-01-20 NOTE — Op Note (Signed)
#  [id number] 

## 2017-01-20 NOTE — Anesthesia Preprocedure Evaluation (Addendum)
Anesthesia Evaluation  Patient identified by MRN, date of birth, ID band Patient awake    Reviewed: Allergy & Precautions, NPO status , Patient's Chart, lab work & pertinent test results  History of Anesthesia Complications (+) PONV and history of anesthetic complications  Airway Mallampati: II  TM Distance: >3 FB Neck ROM: Full    Dental  (+) Teeth Intact, Dental Advisory Given, Caps,    Pulmonary shortness of breath, sleep apnea and Continuous Positive Airway Pressure Ventilation , former smoker,    Pulmonary exam normal breath sounds clear to auscultation       Cardiovascular hypertension, Pt. on medications and Pt. on home beta blockers + dysrhythmias Atrial Fibrillation  Rhythm:Regular Rate:Normal     Neuro/Psych PSYCHIATRIC DISORDERS Depression negative neurological ROS     GI/Hepatic Neg liver ROS, GERD  Medicated,  Endo/Other  diabetes, Type 2, Oral Hypoglycemic Agents, Insulin DependentHypothyroidism Morbid obesity  Renal/GU negative Renal ROS     Musculoskeletal  (+) Arthritis ,   Abdominal   Peds  Hematology  (+) Blood dyscrasia (Xarelto), ,   Anesthesia Other Findings Day of surgery medications reviewed with the patient.  Reproductive/Obstetrics                            Lab Results  Component Value Date   WBC 8.6 09/22/2016   HGB 12.3 09/22/2016   HCT 39.2 09/22/2016   MCV 91.0 09/22/2016   PLT 284 09/22/2016     Anesthesia Physical Anesthesia Plan  ASA: III  Anesthesia Plan: MAC   Post-op Pain Management:    Induction: Intravenous  PONV Risk Score and Plan: 2 and Dexamethasone, Ondansetron and Midazolam  Airway Management Planned: Mask  Additional Equipment:   Intra-op Plan:   Post-operative Plan:   Informed Consent: I have reviewed the patients History and Physical, chart, labs and discussed the procedure including the risks, benefits and alternatives  for the proposed anesthesia with the patient or authorized representative who has indicated his/her understanding and acceptance.   Dental advisory given  Plan Discussed with: CRNA  Anesthesia Plan Comments:        Anesthesia Quick Evaluation

## 2017-01-23 ENCOUNTER — Encounter (HOSPITAL_BASED_OUTPATIENT_CLINIC_OR_DEPARTMENT_OTHER): Payer: Self-pay | Admitting: Orthopaedic Surgery

## 2017-01-23 NOTE — Op Note (Signed)
NAME:  [name], [name]                      ACCOUNT NO.:  MEDICAL RECORD NO.:  [id number]  LOCATION:                                 FACILITY:  PHYSICIAN:  Lubertha Basqueeter G. Timber Marshman, M.D.DATE OF BIRTH:  04/08/1950  DATE OF PROCEDURE:  01/20/2017 DATE OF DISCHARGE:                              OPERATIVE REPORT   PREOPERATIVE DIAGNOSIS:  Right index trigger finger.  POSTOPERATIVE DIAGNOSIS:  Right index trigger finger.  PROCEDURE:  Right A1 pulley release.  ANESTHESIA:  Local and sedation.  INDICATION FOR PROCEDURE:  The patient is a 66 year old woman with a long history of triggering of her right index finger.  This has persisted despite injections and bracing.  She has had other trigger fingers released in the past.  She is offered a trigger finger release at this point.  Informed operative consent was obtained after discussion of possible complications including reaction to anesthesia, infection, and neurovascular injury.  SUMMARY OF FINDINGS AND PROCEDURE:  Under local anesthesia and some light sedation, a right trigger finger release was performed.  We released a very thick A1 pulley.  She could actively flex and extend without any triggering at the end of the case.  DESCRIPTION OF PROCEDURE:  The patient was taken to the operating suite, where some light sedation was given.  She stayed awake throughout the case.  We have applied some local anesthetic at the proximal palmar flexion crease.  I made a transverse incision along this crease and dissected down to the flexor tendon sheath.  I made a longitudinal incision and released the A1 pulley completely.  I pulled this out through the wound to ensure that I had released it completely.  She then flexed actively and extended actively with no triggering.  The wound was irrigated followed by reapproximation of the skin with interrupted nylon sutures.  I did inflate a tourniquet at the beginning of the case and deflated at the end of the  case with good refill to the tips of her fingers thereafter.  Estimated blood loss and intraoperative fluids can be obtained from Anesthesia records as can accurate tourniquet time.  DISPOSITION:  The patient was taken to the recovery room.  Plans were for to go home same-day and follow up in the office in the next week.  I will contact her by phone tonight.    Lubertha BasquePeter G. Jerl Santosalldorf, [name]    PGD/MEDQ  D:  01/20/2017  T:  01/21/2017  Job:  [id number]

## 2017-01-30 DIAGNOSIS — Z9889 Other specified postprocedural states: Secondary | ICD-10-CM | POA: Diagnosis not present

## 2017-02-05 MED FILL — XARELTO 20 MG TABLET: 20 | 30 days supply | Qty: 30 | Fill #4

## 2017-02-06 ENCOUNTER — Other Ambulatory Visit (INDEPENDENT_AMBULATORY_CARE_PROVIDER_SITE_OTHER): Payer: Self-pay | Admitting: Orthopaedic Surgery

## 2017-02-06 MED FILL — INDOMETHACIN 50 MG CAPSULE: 50 | 7 days supply | Qty: 20 | Fill #0

## 2017-02-06 MED FILL — METHOCARBAMOL 500 MG TABS: 500 | 15 days supply | Qty: 60 | Fill #0

## 2017-02-06 NOTE — Telephone Encounter (Signed)
Please advise 

## 2017-02-07 ENCOUNTER — Ambulatory Visit: Payer: 59

## 2017-02-07 ENCOUNTER — Telehealth: Payer: Self-pay | Admitting: Pharmacist

## 2017-02-07 MED FILL — TRULICITY 1.5 MG/0.5 ML PEN: 1.5 | 28 days supply | Qty: 2 | Fill #0

## 2017-02-07 NOTE — Patient Outreach (Signed)
[facility] Care Management  02/07/2017  [name] May 07, 1950 [id number]   Advised patient that disease self-management services will be transitioned from the [facility] program to either [facility] or [facility] in 2019. Also advised that a letter will be mailed to the home residence with details of this transition.            Will close case to [facility] To [facility] diabetes program  Allena Katzaroline E Welles, Pharm.D., BCPS PGY2 Ambulatory Care Pharmacy Resident Phone: [phone]

## 2017-02-09 ENCOUNTER — Ambulatory Visit
Admission: RE | Admit: 2017-02-09 | Discharge: 2017-02-09 | Disposition: A | Discharge status: Home / Self Care | Payer: 59 | Source: Ambulatory Visit | Attending: Internal Medicine | Admitting: Internal Medicine

## 2017-02-09 DIAGNOSIS — Z1231 Encounter for screening mammogram for malignant neoplasm of breast: Secondary | ICD-10-CM

## 2017-02-13 DIAGNOSIS — E113299 Type 2 diabetes mellitus with mild nonproliferative diabetic retinopathy without macular edema, unspecified eye: Secondary | ICD-10-CM | POA: Diagnosis not present

## 2017-02-13 DIAGNOSIS — Z79899 Other long term (current) drug therapy: Secondary | ICD-10-CM | POA: Diagnosis not present

## 2017-02-13 DIAGNOSIS — Z5181 Encounter for therapeutic drug level monitoring: Secondary | ICD-10-CM | POA: Diagnosis not present

## 2017-02-13 DIAGNOSIS — E039 Hypothyroidism, unspecified: Secondary | ICD-10-CM | POA: Diagnosis not present

## 2017-02-13 DIAGNOSIS — Z794 Long term (current) use of insulin: Secondary | ICD-10-CM | POA: Diagnosis not present

## 2017-02-13 DIAGNOSIS — Z8639 Personal history of other endocrine, nutritional and metabolic disease: Secondary | ICD-10-CM | POA: Diagnosis not present

## 2017-02-13 DIAGNOSIS — E1165 Type 2 diabetes mellitus with hyperglycemia: Secondary | ICD-10-CM | POA: Diagnosis not present

## 2017-02-14 ENCOUNTER — Encounter: Payer: Self-pay | Admitting: Physical Therapy

## 2017-02-14 ENCOUNTER — Ambulatory Visit: Payer: 59 | Attending: Orthopaedic Surgery | Admitting: Physical Therapy

## 2017-02-14 DIAGNOSIS — M79644 Pain in right finger(s): Secondary | ICD-10-CM | POA: Insufficient documentation

## 2017-02-14 DIAGNOSIS — M6281 Muscle weakness (generalized): Secondary | ICD-10-CM | POA: Insufficient documentation

## 2017-02-14 DIAGNOSIS — M25641 Stiffness of right hand, not elsewhere classified: Secondary | ICD-10-CM | POA: Insufficient documentation

## 2017-02-14 MED FILL — VALSARTAN 320 MG TABLET: 320 | 30 days supply | Qty: 30 | Fill #1

## 2017-02-14 MED FILL — AMLODIPINE BESYLATE 5 MG TA: 5 | 90 days supply | Qty: 90 | Fill #3

## 2017-02-14 MED FILL — DULoxetine HCL 60 MG CPEP: 60 | 30 days supply | Qty: 30 | Fill #2

## 2017-02-14 NOTE — Patient Instructions (Signed)
   Hold 3-5 sec, passively complete 5 rounds. 2x/day      TOWEL GRIP  Place a rolled up towel in your hand and squeeze.  Hold 10 sec, pain free. 10-15 reps     [facility] [address], [address] [place], [place] [address] Phone # [phone] Fax [phone]

## 2017-02-15 ENCOUNTER — Ambulatory Visit: Payer: 59 | Admitting: Pharmacist

## 2017-02-15 NOTE — Therapy (Signed)
[facility] [address], [address] [place], [place], [address] Phone: [phone]   Fax:  [phone]  Physical Therapy Evaluation  Patient Details  Name: [name] MRN: [id number] Date of Birth: 24-Jun-1950 Referring Provider: Marcene Corning, MD    Encounter Date: 02/14/2017  PT End of Session - 02/14/17 1615    Visit Number  1    Date for PT Re-Evaluation  03/14/17    Authorization Type  [employer] employee    Authorization Time Period  02/14/17 to 03/14/17    PT Start Time  1531    PT Stop Time  1614    PT Time Calculation (min)  43 min    Activity Tolerance  Patient tolerated treatment well;No increased pain    Behavior During Therapy  WFL for tasks assessed/performed       Past Medical History:  Diagnosis Date  . Anemia    hx of  . Arthritis   . Depression   . Diabetes mellitus [facility]  . Dyspnea    with minimal exertion-deconditioned  . Dysrhythmia    a-fib  . GERD (gastroesophageal reflux disease)    occasional  . History of kidney stones   . Hyperlipidemia   . Hypertension   . Hypothyroidism   . Knee pain, right   . 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 OSA on CPAP    uses CPAP nightly  . Osteoarthritis   . Pinched nerve in neck   . Pneumonia    hx of  . PONV (postoperative nausea and vomiting)    for 3-4 days after general anesthesia  . Swelling of knee joint, right   . Synovitis of knee RIGHT  . Trigger finger, left    left index    Past Surgical History:  Procedure Laterality Date  . CESAREAN SECTION  X3  . KNEE ARTHROSCOPY  04/20/2011   Procedure: ARTHROSCOPY KNEE;  Surgeon: Loanne Drilling, MD;  Location: [facility];  Service: Orthopedics;  Laterality: Right;  WITH SYNOVECTOMY  . LEFT CARPAL TUNNEL / LEFT MIDDLE & RING FINGER TRIGGER RELEASE  08-26-2008  . LEFT SHOULDER ARTHROSCOPY W/ DEBRIDEMENT  09-09-2003  . LEFT SHOULDER ARTHROSCOPY/ LEFT THUMB TRIGGER  RELEASE  02-22-2005  . PULLEY RELEASE LEFT LONG FINGER  07-14-2009  . RIGHT CARPAL TUNNEL/ RIGHT THUMB TRIGGER RELEASE'S  11-28-2006  . RIGHT SHOULDER ARTHROSCOPY W/ ROTATOR CUFF REPAIR  01-13-2004  . SHOULDER ARTHROSCOPY DISTAL CLAVICLE EXCISION AND OPEN ROTATOR CUFF REPAIR  09-07-2004   LEFT  . SHOULDER ARTHROSCOPY W/ ACROMIAL REPAIR  11-29-2005   LEFT  . TONSILLECTOMY AND ADENOIDECTOMY  child  . TOTAL KNEE ARTHROPLASTY  12-14-2009   RIGHT  . TOTAL KNEE ARTHROPLASTY Left 11/05/2012   Procedure: LEFT TOTAL KNEE ARTHROPLASTY;  Surgeon: Loanne Drilling, MD;  Location: [facility];  Service: Orthopedics;  Laterality: Left;  . TOTAL KNEE REVISION Right 07/29/2016   Procedure: RIGHT KNEE POLY-LINER EXCHANGE;  Surgeon: Kathryne Hitch, MD;  Location: [facility];  Service: Orthopedics;  Laterality: Right;  . TRIGGER FINGER RELEASE Right 02/21/2013   Procedure: RIGHT RING A-1 PULLEY RELEASE    (MINOR PROCEDURE) ;  Surgeon: Wyn Forster., MD;  Location: [facility];  Service: Orthopedics;  Laterality: Right;  . TRIGGER FINGER RELEASE Left 09/22/2016   Procedure: RELEASE TRIGGER FINGER LEFT INDEX FINGER;  Surgeon: Kathryne Hitch, MD;  Location: [facility];  Service: Orthopedics;  Laterality:  Left;  . TRIGGER FINGER RELEASE Right 01/20/2017   Procedure: RELEASE TRIGGER FINGER/A-1 PULLEY RIGHT INDEX FINGER;  Surgeon: Marcene Corning, MD;  Location: [facility];  Service: Orthopedics;  Laterality: Right;    There were no vitals filed for this visit.   Subjective Assessment - 02/14/17 1532    Subjective  Pt reports that she had a trigger finger release on 01/30/17. She has had nearly all of her other fingers released, except for her 5th fingers. She says that she still has some stiffness and pain in her index finger despite the release. 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     Limitations  Other (comment) typing     Patient Stated Goals  improve use of  her Rt finger and decrease pain     Currently in Pain?  Yes    Pain Score  3     Pain Location  Finger (Comment which one) Rt index finger     Pain Orientation  Right    Pain Descriptors / Indicators  Sore    Pain Type  Acute pain;Surgical pain    Pain Radiating Towards  none     Pain Onset  1 to 4 weeks ago    Pain Frequency  Intermittent    Aggravating Factors   alot of use     Pain Relieving Factors  ice/compression     Effect of Pain on Daily Activities  typing on computers     Multiple Pain Sites  No         [facility] PT Assessment - 02/15/17 0001      Assessment   Medical Diagnosis  Rt trigger finger release     Referring Provider  Marcene Corning, MD     Onset Date/Surgical Date  01/30/17    Hand Dominance  Right    Next MD Visit  02/20/17      Precautions   Precautions  None      Balance Screen   Has the patient fallen in the past 6 months  No    Has the patient had a decrease in activity level because of a fear of falling?   No    Is the patient reluctant to leave their home because of a fear of falling?   No      Home Public house manager residence      Prior Function   Level of Independence  Independent      Cognition   Overall Cognitive Status  Within Functional Limits for tasks assessed      Observation/Other Assessments   Observations  Swelling of the Rt digit, primarily along the 1st segment      Sensation   Light Touch  Appears Intact      AROM   Right Composite Finger Extension  -- 2nd MCP 10 deg    Right Composite Finger Flexion  25% largely limited by swelling     Left Composite Finger Extension  -- 2nd MCP 18 deg       Strength   Overall Strength Comments  Difficulty attaining lumbrical grip position with Rt hand, painful finger adduction     Right Hand Grip (lbs)  43    Right Hand 3 Point Pinch  8 lbs thumb and index finger 4lb force    Left Hand Grip (lbs)  34    Left Hand 3 Point Pinch  14 lbs thumb and index finger  12lb  force      Palpation   Palpation comment  scar adhesions along surgical incision; restricted joint mobility of the 2nd Rt MCP joint              Objective measurements completed on examination: See above findings.      [facility] Adult PT Treatment/Exercise - 02/15/17 0001      Self-Care   Self-Care  RICE;Scar Mobilizations    RICE  ice 15min throughout the day, wearing compression "sleeve" for her finger    Scar Mobilizations  several times a day, various directions       Exercises   Exercises  Hand      Hand Exercises   Tendon Glides  x5 reps, 2 reps passive only     Other Hand Exercises  Rt hand towel squeeze pain free x5 reps, 10 sec hold                PT Short Term Goals - 02/14/17 1616      PT SHORT TERM GOAL #1   Title  be independent in initial HEP to improve swelling and decrease pain.    Time  2    Period  Days    Status  New    Target Date  02/28/17      PT SHORT TERM GOAL #2   Title  Pt will demo improved Rt 2nd MTP passive extension to atleast 20 deg to improve her ability to type on her computer at work.     Time  2    Period  Weeks    Status  New        PT Long Term Goals - 02/14/17 1617      PT LONG TERM GOAL #1   Title  be independent in advanced HEP to allow for maintenance of finger ROM and strength at discharge.     Time  4    Period  Weeks    Status  New    Target Date  03/14/17      PT LONG TERM GOAL #2   Title  Pt will report atleast 40% improvement in her finger pain and function from the start of therapy, to allow for increased participation in activity at home and work.     Time  4    Period  Weeks    Status  New      PT LONG TERM GOAL #3   Title  Pt will demo improved Rt pincer grip strength of the 2nd finger to within 5 lb of the Lt, to increase her ability to unlock her door without difficulty.     Time  4    Period  Weeks    Status  New      PT LONG TERM GOAL #4   Title  Pt will demo improved Rt grip  strength to within 5lb force of her LUE.     Time  4    Period  Weeks    Status  New             Plan - 02/15/17 0744    Clinical Impression Statement  Pt is a pleasant [age] y.o F referred to OPPT s/p Rt index trigger finger release on 01/30/17. She has had multiple releases on other digits in the past without any difficulty, however she continues to have pain, swelling and stiffness in her Rt index finger. She has limited active and passive MCP extension, as well as decreased pincer  and grip strength on the Rt compared to the Lt. She does have excess swelling along the proximal joint which is likely contributing to her lack of ROM. She also has probable tendon irritation with weakness of the lumbrical grip and pain with passive and active finger extension. She would benefit from several sessions of skilled PT to provide education of pain management techniques and provide intervention to increase her ROM, strength and functional use of the Rt hand.     History and Personal Factors relevant to plan of care:  history of Rt index finger injury; multiple releases to other digits in the past     Clinical Presentation  Stable    Clinical Presentation due to:  no worse    Clinical Decision Making  Low    Rehab Potential  Good    PT Frequency  Other (comment) 1-2x/week     PT Duration  4 weeks    PT Treatment/Interventions  ADLs/Self Care Home Management;Cryotherapy;Ultrasound;Moist Heat;Iontophoresis 4mg /ml Dexamethasone;Therapeutic activities;Therapeutic exercise;Patient/family education;Manual techniques;Dry needling;Passive range of motion;Scar mobilization    PT Next Visit Plan  finger mobility, grip strengthening, manual/ice to address swelling/pain     Consulted and Agree with Plan of Care  Patient       Patient will benefit from skilled therapeutic intervention in order to improve the following deficits and impairments:  Decreased activity tolerance, Increased edema, Impaired flexibility,  Hypomobility, Decreased strength, Decreased range of motion, Decreased mobility, Decreased scar mobility, Pain  Visit Diagnosis: Pain in right finger(s)  Stiffness of right hand, not elsewhere classified  Muscle weakness (generalized)     Problem List Patient Active Problem List   Diagnosis Date Noted  . Acute pain of right knee 12/22/2016  . Trigger index finger of left hand 09/22/2016  . 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 Diabetes (HCC) 01/08/2014  . Essential hypertension 01/08/2014  . Hyperlipidemia 01/08/2014  . Postoperative anemia due to acute blood loss 11/22/2012  . Hyponatremia 11/06/2012  . OA (osteoarthritis) of knee 11/05/2012  . Villonodular synovitis of knee 04/20/2011   7:52 AM,02/15/17 Marylyn IshiharaSara Kiser PT, DPT [facility]  [phone]   [facility] [address] [place], [place], [address] Phone: [phone]   Fax:  [phone]  Name: [name] MRN: [id number] Date of Birth: 01-08-1951

## 2017-02-16 ENCOUNTER — Ambulatory Visit: Payer: 59 | Admitting: Physical Therapy

## 2017-02-16 DIAGNOSIS — M25641 Stiffness of right hand, not elsewhere classified: Secondary | ICD-10-CM | POA: Diagnosis not present

## 2017-02-16 DIAGNOSIS — M79644 Pain in right finger(s): Secondary | ICD-10-CM

## 2017-02-16 DIAGNOSIS — M6281 Muscle weakness (generalized): Secondary | ICD-10-CM

## 2017-02-16 NOTE — Therapy (Signed)
[facility] [address], [address] [place], [place], [address] Phone: [phone]   Fax:  [phone]  Physical Therapy Treatment  Patient Details  Name: Stefanie DeemBonnie D Gonzalo MRN: [id number] Date of Birth: 05-31-1950 Referring Provider: Marcene CorningPeter Dalldorf, MD    Encounter Date: 02/16/2017  PT End of Session - 02/16/17 1654    Visit Number  2    Date for PT Re-Evaluation  03/14/17    Authorization Type  [employer] employee    Authorization Time Period  02/14/17 to 03/14/17    PT Start Time  1530    PT Stop Time  1613    PT Time Calculation (min)  43 min    Activity Tolerance  Patient tolerated treatment well       Past Medical History:  Diagnosis Date  . Anemia    hx of  . Arthritis   . Depression   . Diabetes mellitus [facility]  . Dyspnea    with minimal exertion-deconditioned  . Dysrhythmia    a-fib  . GERD (gastroesophageal reflux disease)    occasional  . History of kidney stones   . Hyperlipidemia   . Hypertension   . Hypothyroidism   . Knee pain, right   . 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 OSA on CPAP    uses CPAP nightly  . Osteoarthritis   . Pinched nerve in neck   . Pneumonia    hx of  . PONV (postoperative nausea and vomiting)    for 3-4 days after general anesthesia  . Swelling of knee joint, right   . Synovitis of knee RIGHT  . Trigger finger, left    left index    Past Surgical History:  Procedure Laterality Date  . CESAREAN SECTION  X3  . KNEE ARTHROSCOPY  04/20/2011   Procedure: ARTHROSCOPY KNEE;  Surgeon: Loanne DrillingFrank V Aluisio, MD;  [facility];  Service: Orthopedics;  Laterality: Right;  WITH SYNOVECTOMY  . LEFT CARPAL TUNNEL / LEFT MIDDLE & RING FINGER TRIGGER RELEASE  08-26-2008  . LEFT SHOULDER ARTHROSCOPY W/ DEBRIDEMENT  09-09-2003  . LEFT SHOULDER ARTHROSCOPY/ LEFT THUMB TRIGGER RELEASE  02-22-2005  . PULLEY RELEASE LEFT LONG FINGER  07-14-2009  . RIGHT  CARPAL TUNNEL/ RIGHT THUMB TRIGGER RELEASE'S  11-28-2006  . RIGHT SHOULDER ARTHROSCOPY W/ ROTATOR CUFF REPAIR  01-13-2004  . SHOULDER ARTHROSCOPY DISTAL CLAVICLE EXCISION AND OPEN ROTATOR CUFF REPAIR  09-07-2004   LEFT  . SHOULDER ARTHROSCOPY W/ ACROMIAL REPAIR  11-29-2005   LEFT  . TONSILLECTOMY AND ADENOIDECTOMY  child  . TOTAL KNEE ARTHROPLASTY  12-14-2009   RIGHT  . TOTAL KNEE ARTHROPLASTY Left 11/05/2012   Procedure: LEFT TOTAL KNEE ARTHROPLASTY;  Surgeon: Loanne DrillingFrank V Aluisio, MD;  Location: [facility];  Service: Orthopedics;  Laterality: Left;  . TOTAL KNEE REVISION Right 07/29/2016   Procedure: RIGHT KNEE POLY-LINER EXCHANGE;  Surgeon: Kathryne HitchBlackman, Christopher Y, MD;  Location: [facility];  Service: Orthopedics;  Laterality: Right;  . TRIGGER FINGER RELEASE Right 02/21/2013   Procedure: RIGHT RING A-1 PULLEY RELEASE    (MINOR PROCEDURE) ;  Surgeon: Wyn Forsterobert V Sypher Jr., MD;  [facility] [place];  Service: Orthopedics;  Laterality: Right;  . TRIGGER FINGER RELEASE Left 09/22/2016   Procedure: RELEASE TRIGGER FINGER LEFT INDEX FINGER;  Surgeon: Kathryne HitchBlackman, Christopher Y, MD;  Location: [facility];  Service: Orthopedics;  Laterality: Left;  . TRIGGER FINGER RELEASE Right 01/20/2017   Procedure: RELEASE TRIGGER  FINGER/A-1 PULLEY RIGHT INDEX FINGER;  Surgeon: Marcene Corningalldorf, Peter, MD;  Location: [facility];  Service: Orthopedics;  Laterality: Right;    There were no vitals filed for this visit.  Subjective Assessment - 02/16/17 1532    Subjective  Trigger finger release on 01/30/17.  Hand is not too bad today.  Sleeve helps.      Currently in Pain?  Yes    Pain Score  2     Pain Location  Finger (Comment which one)    Pain Type  Acute pain                      [facility] Adult PT Treatment/Exercise - 02/16/17 0001      Hand Exercises   PIPJ Flexion  AROM;Right    DIPJ Flexion  AROM;Right;10 reps    Thumb Opposition  5x    Tendon Glides  x5 reps, 2 reps passive only      Other Hand Exercises  finger adduction/abduction 5x    Other Hand Exercises  wrist ROM      Ultrasound   Ultrasound Location  right palm     Ultrasound Parameters  .8 w/cm2 8 min 100%    Ultrasound Goals  Pain      Manual Therapy   Soft tissue mobilization  instrument assisted [place] to thenar eminence, index flexors and extensors gentle secondary to some tenderness around scar               PT Short Term Goals - 02/14/17 1616      PT SHORT TERM GOAL #1   Title  be independent in initial HEP to improve swelling and decrease pain.    Time  2    Period  Days    Status  New    Target Date  02/28/17      PT SHORT TERM GOAL #2   Title  Pt will demo improved Rt 2nd MTP passive extension to atleast 20 deg to improve her ability to type on her computer at work.     Time  2    Period  Weeks    Status  New        PT Long Term Goals - 02/14/17 1617      PT LONG TERM GOAL #1   Title  be independent in advanced HEP to allow for maintenance of finger ROM and strength at discharge.     Time  4    Period  Weeks    Status  New    Target Date  03/14/17      PT LONG TERM GOAL #2   Title  Pt will report atleast 40% improvement in her finger pain and function from the start of therapy, to allow for increased participation in activity at home and work.     Time  4    Period  Weeks    Status  New      PT LONG TERM GOAL #3   Title  Pt will demo improved Rt pincer grip strength of the 2nd finger to within 5 lb of the Lt, to increase her ability to unlock her door without difficulty.     Time  4    Period  Weeks    Status  New      PT LONG TERM GOAL #4   Title  Pt will demo improved Rt grip strength to within 5lb force of her LUE.     Time  4    Period  Weeks    Status  New            Plan - 02/16/17 1654    Clinical Impression Statement  The patient reports difficulty with extending and flexing her right index finger as well as pain and swelling.  Pinching her  index and thumb fingers are painful.  Decreased scar mobility.      Rehab Potential  Good    PT Duration  4 weeks    PT Treatment/Interventions  ADLs/Self Care Home Management;Cryotherapy;Ultrasound;Moist Heat;Iontophoresis 4mg /ml Dexamethasone;Therapeutic activities;Therapeutic exercise;Patient/family education;Manual techniques;Dry needling;Passive range of motion;Scar mobilization    PT Next Visit Plan  finger mobility, grip strengthening, manual/ice to address swelling/pain ;  scar mobilization;  ultrasound       Patient will benefit from skilled therapeutic intervention in order to improve the following deficits and impairments:  Decreased activity tolerance, Increased edema, Impaired flexibility, Hypomobility, Decreased strength, Decreased range of motion, Decreased mobility, Decreased scar mobility, Pain  Visit Diagnosis: Pain in right finger(s)  Stiffness of right hand, not elsewhere classified  Muscle weakness (generalized)     Problem List Patient Active Problem List   Diagnosis Date Noted  . Acute pain of right knee 12/22/2016  . Trigger index finger of left hand 09/22/2016  . 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 Diabetes (HCC) 01/08/2014  . Essential hypertension 01/08/2014  . Hyperlipidemia 01/08/2014  . Postoperative anemia due to acute blood loss 11/22/2012  . Hyponatremia 11/06/2012  . OA (osteoarthritis) of knee 11/05/2012  . Villonodular synovitis of knee 04/20/2011   Lavinia Sharps, PT 02/16/17 5:06 PM Phone: [phone] Fax: [phone]  Vivien Presto 02/16/2017, 5:06 PM  [facility] [address], [address] [place], [place], [address] Phone: [phone]   Fax:  [phone]  Name: BRIEANNE MIGNONE MRN: [id number] Date of Birth:  [birth year]

## 2017-02-21 ENCOUNTER — Ambulatory Visit: Payer: 59 | Admitting: Physical Therapy

## 2017-02-21 ENCOUNTER — Encounter: Payer: Self-pay | Admitting: Physical Therapy

## 2017-02-21 DIAGNOSIS — M6281 Muscle weakness (generalized): Secondary | ICD-10-CM

## 2017-02-21 DIAGNOSIS — M25641 Stiffness of right hand, not elsewhere classified: Secondary | ICD-10-CM

## 2017-02-21 DIAGNOSIS — M79644 Pain in right finger(s): Secondary | ICD-10-CM | POA: Diagnosis not present

## 2017-02-21 NOTE — Therapy (Signed)
[facility] [address], [address] [place], [place], [address] Phone: [phone]   Fax:  [phone]  Physical Therapy Treatment  Patient Details  Name: [name] MRN: [id number] Date of Birth: 1951-01-12 Referring Provider: Marcene CorningPeter Dalldorf, MD    Encounter Date: 02/21/2017  PT End of Session - 02/21/17 1614    Visit Number  3    Date for PT Re-Evaluation  03/14/17    Authorization Type  [employer] employee    Authorization Time Period  02/14/17 to 03/14/17    PT Start Time  1530    PT Stop Time  1610    PT Time Calculation (min)  40 min    Activity Tolerance  Patient tolerated treatment well    Behavior During Therapy  [facility] for tasks assessed/performed       Past Medical History:  Diagnosis Date  . Anemia    hx of  . Arthritis   . Depression   . Diabetes mellitus [facility]  . Dyspnea    with minimal exertion-deconditioned  . Dysrhythmia    a-fib  . GERD (gastroesophageal reflux disease)    occasional  . History of kidney stones   . Hyperlipidemia   . Hypertension   . Hypothyroidism   . Knee pain, right   . 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 OSA on CPAP    uses CPAP nightly  . Osteoarthritis   . Pinched nerve in neck   . Pneumonia    hx of  . PONV (postoperative nausea and vomiting)    for 3-4 days after general anesthesia  . Swelling of knee joint, right   . Synovitis of knee RIGHT  . Trigger finger, left    left index    Past Surgical History:  Procedure Laterality Date  . CESAREAN SECTION  X3  . KNEE ARTHROSCOPY  04/20/2011   Procedure: ARTHROSCOPY KNEE;  Surgeon: Loanne DrillingFrank V Aluisio, MD;  [facility] [place];  Service: Orthopedics;  Laterality: Right;  WITH SYNOVECTOMY  . LEFT CARPAL TUNNEL / LEFT MIDDLE & RING FINGER TRIGGER RELEASE  08-26-2008  . LEFT SHOULDER ARTHROSCOPY W/ DEBRIDEMENT  09-09-2003  . LEFT SHOULDER ARTHROSCOPY/ LEFT THUMB TRIGGER RELEASE   02-22-2005  . PULLEY RELEASE LEFT LONG FINGER  07-14-2009  . RIGHT CARPAL TUNNEL/ RIGHT THUMB TRIGGER RELEASE'S  11-28-2006  . RIGHT SHOULDER ARTHROSCOPY W/ ROTATOR CUFF REPAIR  01-13-2004  . SHOULDER ARTHROSCOPY DISTAL CLAVICLE EXCISION AND OPEN ROTATOR CUFF REPAIR  09-07-2004   LEFT  . SHOULDER ARTHROSCOPY W/ ACROMIAL REPAIR  11-29-2005   LEFT  . TONSILLECTOMY AND ADENOIDECTOMY  child  . TOTAL KNEE ARTHROPLASTY  12-14-2009   RIGHT  . TOTAL KNEE ARTHROPLASTY Left 11/05/2012   Procedure: LEFT TOTAL KNEE ARTHROPLASTY;  Surgeon: Loanne DrillingFrank V Aluisio, MD;  Location: [facility];  Service: Orthopedics;  Laterality: Left;  . TOTAL KNEE REVISION Right 07/29/2016   Procedure: RIGHT KNEE POLY-LINER EXCHANGE;  Surgeon: Kathryne HitchBlackman, Christopher Y, MD;  Location: [facility];  Service: Orthopedics;  Laterality: Right;  . TRIGGER FINGER RELEASE Right 02/21/2013   Procedure: RIGHT RING A-1 PULLEY RELEASE    (MINOR PROCEDURE) ;  Surgeon: Wyn Forsterobert V Sypher Jr., MD;  [facility] [place];  Service: Orthopedics;  Laterality: Right;  . TRIGGER FINGER RELEASE Left 09/22/2016   Procedure: RELEASE TRIGGER FINGER LEFT INDEX FINGER;  Surgeon: Kathryne HitchBlackman, Christopher Y, MD;  Location: [facility];  Service: Orthopedics;  Laterality: Left;  .  TRIGGER FINGER RELEASE Right 01/20/2017   Procedure: RELEASE TRIGGER FINGER/A-1 PULLEY RIGHT INDEX FINGER;  Surgeon: Marcene Corningalldorf, Peter, MD;  Location: [facility];  Service: Orthopedics;  Laterality: Right;    There were no vitals filed for this visit.  Subjective Assessment - 02/21/17 1531    Subjective  My trigger finger is not bad.  Right now it hurts to straighten and bend it.  The ultrasound has helped at the time it is being done.     Patient Stated Goals  improve use of her Rt finger and decrease pain     Currently in Pain?  Yes    Pain Score  3  index finger    Pain Location  Finger (Comment which one)    Pain Orientation  Right    Pain Descriptors / Indicators  Sore     Pain Type  Acute pain    Pain Onset  1 to 4 weeks ago    Pain Frequency  Intermittent    Aggravating Factors   alot of use    Pain Relieving Factors  ice/compression    Effect of Pain on Daily Activities  typing on computers    Multiple Pain Sites  No                      [facility] Adult PT Treatment/Exercise - 02/21/17 0001      Hand Exercises   PIPJ Flexion  AROM;Right    DIPJ Flexion  AROM;Right;10 reps    Thumb Opposition  5x    Tendon Glides  x5 reps, 2 reps passive only     Other Hand Exercises  finger adduction/abduction 5x using the yellow band      Modalities   Modalities  Ultrasound      Ultrasound   Ultrasound Location  right ant. index MCP joint    Ultrasound Parameters  100%, 8 min, 0.8/w/cm2    Ultrasound Goals  Pain      Manual Therapy   Manual Therapy  Soft tissue mobilization;Joint mobilization;Passive ROM    Joint Mobilization  grade 3 to right incex finger MCP joint all direction    Soft tissue mobilization  right palm, interossi, around the join of the index finger, in the thenar emmince, posterior right index finger    Passive ROM  right index finger in all directions               PT Short Term Goals - 02/14/17 1616      PT SHORT TERM GOAL #1   Title  be independent in initial HEP to improve swelling and decrease pain.    Time  2    Period  Days    Status  New    Target Date  02/28/17      PT SHORT TERM GOAL #2   Title  Pt will demo improved Rt 2nd MTP passive extension to atleast 20 deg to improve her ability to type on her computer at work.     Time  2    Period  Weeks    Status  New        PT Long Term Goals - 02/14/17 1617      PT LONG TERM GOAL #1   Title  be independent in advanced HEP to allow for maintenance of finger ROM and strength at discharge.     Time  4    Period  Weeks    Status  New  Target Date  03/14/17      PT LONG TERM GOAL #2   Title  Pt will report atleast 40% improvement in her finger  pain and function from the start of therapy, to allow for increased participation in activity at home and work.     Time  4    Period  Weeks    Status  New      PT LONG TERM GOAL #3   Title  Pt will demo improved Rt pincer grip strength of the 2nd finger to within 5 lb of the Lt, to increase her ability to unlock her door without difficulty.     Time  4    Period  Weeks    Status  New      PT LONG TERM GOAL #4   Title  Pt will demo improved Rt grip strength to within 5lb force of her LUE.     Time  4    Period  Weeks    Status  New            Plan - 02/21/17 1614    Clinical Impression Statement  Patient had soreness on right index finger after treatment but felt she had increased mobility. Patient had improved right index finger MCP joint movement after joint mobilization.  Patient has thickness around the scar that decreased alittle after therapy.  Patient will benefit from skilled therapy to reduce pain, improve scar mobiity and improve function.    Rehab Potential  Good    PT Frequency  Other (comment) 1-2 weeks    PT Duration  4 weeks    PT Treatment/Interventions  ADLs/Self Care Home Management;Cryotherapy;Ultrasound;Moist Heat;Iontophoresis 4mg /ml Dexamethasone;Therapeutic activities;Therapeutic exercise;Patient/family education;Manual techniques;Dry needling;Passive range of motion;Scar mobilization    PT Next Visit Plan  finger mobility, grip strengthening, manual/ice to address swelling/pain ;  scar mobilization;  ultrasound; maybe dry needling    PT Home Exercise Plan  progress as needed    Recommended Other Services  MD signed initial summary    Consulted and Agree with Plan of Care  Patient       Patient will benefit from skilled therapeutic intervention in order to improve the following deficits and impairments:  Decreased activity tolerance, Increased edema, Impaired flexibility, Hypomobility, Decreased strength, Decreased range of motion, Decreased mobility,  Decreased scar mobility, Pain  Visit Diagnosis: Pain in right finger(s)  Stiffness of right hand, not elsewhere classified  Muscle weakness (generalized)     Problem List Patient Active Problem List   Diagnosis Date Noted  . Acute pain of right knee 12/22/2016  . Trigger index finger of left hand 09/22/2016  . 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 Diabetes (HCC) 01/08/2014  . Essential hypertension 01/08/2014  . Hyperlipidemia 01/08/2014  . Postoperative anemia due to acute blood loss 11/22/2012  . Hyponatremia 11/06/2012  . OA (osteoarthritis) of knee 11/05/2012  . Villonodular synovitis of knee 04/20/2011    Eulis Foster, PT 02/21/17 4:20 PM   [facility] [address], [address] [place], [place], [address] Phone: [phone]   Fax:  [phone]  Name: [name] MRN: [id number] Date of Birth: 13-Oct-1950

## 2017-02-23 ENCOUNTER — Ambulatory Visit: Payer: 59 | Admitting: Physical Therapy

## 2017-02-23 DIAGNOSIS — M25641 Stiffness of right hand, not elsewhere classified: Secondary | ICD-10-CM

## 2017-02-23 DIAGNOSIS — M79644 Pain in right finger(s): Secondary | ICD-10-CM

## 2017-02-23 DIAGNOSIS — M6281 Muscle weakness (generalized): Secondary | ICD-10-CM | POA: Diagnosis not present

## 2017-02-23 NOTE — Therapy (Signed)
[facility] [address], [address] [place], [place], [address] Phone: [phone]   Fax:  [phone]  Physical Therapy Treatment  Patient Details  Name: [name] MRN: [id number] Date of Birth: 1951/02/13 Referring Provider: Marcene CorningPeter Dalldorf, MD    Encounter Date: 02/23/2017  PT End of Session - 02/23/17 1616    Visit Number  4    Date for PT Re-Evaluation  03/14/17    Authorization Type  [employer] employee    Authorization Time Period  02/14/17 to 03/14/17    PT Start Time  1531    PT Stop Time  1614    PT Time Calculation (min)  43 min    Activity Tolerance  Patient tolerated treatment well;No increased pain    Behavior During Therapy  WFL for tasks assessed/performed       Past Medical History:  Diagnosis Date  . Anemia    hx of  . Arthritis   . Depression   . Diabetes mellitus [facility]  . Dyspnea    with minimal exertion-deconditioned  . Dysrhythmia    a-fib  . GERD (gastroesophageal reflux disease)    occasional  . History of kidney stones   . Hyperlipidemia   . Hypertension   . Hypothyroidism   . Knee pain, right   . 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 OSA on CPAP    uses CPAP nightly  . Osteoarthritis   . Pinched nerve in neck   . Pneumonia    hx of  . PONV (postoperative nausea and vomiting)    for 3-4 days after general anesthesia  . Swelling of knee joint, right   . Synovitis of knee RIGHT  . Trigger finger, left    left index    Past Surgical History:  Procedure Laterality Date  . CESAREAN SECTION  X3  . KNEE ARTHROSCOPY  04/20/2011   Procedure: ARTHROSCOPY KNEE;  Surgeon: Loanne DrillingFrank V Aluisio, MD;  [facility];  Service: Orthopedics;  Laterality: Right;  WITH SYNOVECTOMY  . LEFT CARPAL TUNNEL / LEFT MIDDLE & RING FINGER TRIGGER RELEASE  08-26-2008  . LEFT SHOULDER ARTHROSCOPY W/ DEBRIDEMENT  09-09-2003  . LEFT SHOULDER ARTHROSCOPY/ LEFT THUMB TRIGGER  RELEASE  02-22-2005  . PULLEY RELEASE LEFT LONG FINGER  07-14-2009  . RIGHT CARPAL TUNNEL/ RIGHT THUMB TRIGGER RELEASE'S  11-28-2006  . RIGHT SHOULDER ARTHROSCOPY W/ ROTATOR CUFF REPAIR  01-13-2004  . SHOULDER ARTHROSCOPY DISTAL CLAVICLE EXCISION AND OPEN ROTATOR CUFF REPAIR  09-07-2004   LEFT  . SHOULDER ARTHROSCOPY W/ ACROMIAL REPAIR  11-29-2005   LEFT  . TONSILLECTOMY AND ADENOIDECTOMY  child  . TOTAL KNEE ARTHROPLASTY  12-14-2009   RIGHT  . TOTAL KNEE ARTHROPLASTY Left 11/05/2012   Procedure: LEFT TOTAL KNEE ARTHROPLASTY;  Surgeon: Loanne DrillingFrank V Aluisio, MD;  Location: [facility];  Service: Orthopedics;  Laterality: Left;  . TOTAL KNEE REVISION Right 07/29/2016   Procedure: RIGHT KNEE POLY-LINER EXCHANGE;  Surgeon: Kathryne HitchBlackman, Christopher Y, MD;  Location: [facility];  Service: Orthopedics;  Laterality: Right;  . TRIGGER FINGER RELEASE Right 02/21/2013   Procedure: RIGHT RING A-1 PULLEY RELEASE    (MINOR PROCEDURE) ;  Surgeon: Wyn Forsterobert V Sypher Jr., MD;  [facility];  Service: Orthopedics;  Laterality: Right;  . TRIGGER FINGER RELEASE Left 09/22/2016   Procedure: RELEASE TRIGGER FINGER LEFT INDEX FINGER;  Surgeon: Kathryne HitchBlackman, Christopher Y, MD;  Location: [facility];  Service: Orthopedics;  Laterality:  Left;  . TRIGGER FINGER RELEASE Right 01/20/2017   Procedure: RELEASE TRIGGER FINGER/A-1 PULLEY RIGHT INDEX FINGER;  Surgeon: Marcene Corningalldorf, Peter, MD;  Location: [facility];  Service: Orthopedics;  Laterality: Right;    There were no vitals filed for this visit.  Subjective Assessment - 02/23/17 1532    Subjective  Pt reports that her finger is really hurting her today. She is having issues with stiffness and sharp pain along the proximal joint line. She continues to use ice/compression for pain management.     Patient Stated Goals  improve use of her Rt finger and decrease pain     Currently in Pain?  Yes    Pain Score  4     Pain Location  Finger (Comment which one) Rt index     Pain Descriptors / Indicators  Sore    Pain Type  Acute pain    Pain Radiating Towards  none     Pain Onset  1 to 4 weeks ago    Pain Frequency  Constant    Aggravating Factors   typing, etc.     Pain Relieving Factors  ice/compression                       [facility] Adult PT Treatment/Exercise - 02/23/17 0001      Hand Exercises   DIPJ Flexion  AROM;Right;10 reps    Thumb Opposition  5x      Ultrasound   Ultrasound Location  Rt palm and ant index finger    Ultrasound Parameters  100%, 8 min, .8 w/cm2    Ultrasound Goals  Pain      Manual Therapy   Joint Mobilization  Grade III AP and PA mobilization to Rt MCP     Soft tissue mobilization  Rt thenar eminence, anterior Rt index finger; scar mobilization catch and stretch with MCP extension     Passive ROM  Rt MCP extension with PIP and DIP sustained flexion x10 reps              PT Education - 02/23/17 1615    Education provided  Yes    Education Details  massage techniques for home; adjustments to finger position with extension stretch     Person(s) Educated  Patient    Methods  Explanation    Comprehension  Verbalized understanding;Returned demonstration       PT Short Term Goals - 02/14/17 1616      PT SHORT TERM GOAL #1   Title  be independent in initial HEP to improve swelling and decrease pain.    Time  2    Period  Days    Status  New    Target Date  02/28/17      PT SHORT TERM GOAL #2   Title  Pt will demo improved Rt 2nd MTP passive extension to atleast 20 deg to improve her ability to type on her computer at work.     Time  2    Period  Weeks    Status  New        PT Long Term Goals - 02/14/17 1617      PT LONG TERM GOAL #1   Title  be independent in advanced HEP to allow for maintenance of finger ROM and strength at discharge.     Time  4    Period  Weeks    Status  New    Target Date  03/14/17  PT LONG TERM GOAL #2   Title  Pt will report atleast 40% improvement in her  finger pain and function from the start of therapy, to allow for increased participation in activity at home and work.     Time  4    Period  Weeks    Status  New      PT LONG TERM GOAL #3   Title  Pt will demo improved Rt pincer grip strength of the 2nd finger to within 5 lb of the Lt, to increase her ability to unlock her door without difficulty.     Time  4    Period  Weeks    Status  New      PT LONG TERM GOAL #4   Title  Pt will demo improved Rt grip strength to within 5lb force of her LUE.     Time  4    Period  Weeks    Status  New            Plan - 02/23/17 1616    Clinical Impression Statement  Pt continues to report pain with active and passive finger ROM. Pt with increased swelling and pain upon arrival, so heavy focus was on manual techniques to address swelling, scar mobility and provide gentle tendon stretching/gliding within pain free ranges. Noted improvements in scar mobility and pt reported improved swelling and pain following today's session. She is following up with her referring physician on Monday, in the mean time we will continue with the current POC to decrease swelling, pain and stiffness.     Rehab Potential  Good    PT Frequency  Other (comment) 1-2 weeks    PT Duration  4 weeks    PT Treatment/Interventions  ADLs/Self Care Home Management;Cryotherapy;Ultrasound;Moist Heat;Iontophoresis 4mg /ml Dexamethasone;Therapeutic activities;Therapeutic exercise;Patient/family education;Manual techniques;Dry needling;Passive range of motion;Scar mobilization    PT Next Visit Plan  f/u on MD appointment; finger mobility, grip strengthening, manual/ice to address swelling/pain ;  scar mobilization;  ultrasound; maybe dry needling    PT Home Exercise Plan  progress as needed    Consulted and Agree with Plan of Care  Patient       Patient will benefit from skilled therapeutic intervention in order to improve the following deficits and impairments:  Decreased activity  tolerance, Increased edema, Impaired flexibility, Hypomobility, Decreased strength, Decreased range of motion, Decreased mobility, Decreased scar mobility, Pain  Visit Diagnosis: Pain in right finger(s)  Stiffness of right hand, not elsewhere classified  Muscle weakness (generalized)     Problem List Patient Active Problem List   Diagnosis Date Noted  . Acute pain of right knee 12/22/2016  . Trigger index finger of left hand 09/22/2016  . 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 Diabetes (HCC) 01/08/2014  . Essential hypertension 01/08/2014  . Hyperlipidemia 01/08/2014  . Postoperative anemia due to acute blood loss 11/22/2012  . Hyponatremia 11/06/2012  . OA (osteoarthritis) of knee 11/05/2012  . Villonodular synovitis of knee 04/20/2011    4:21 PM,02/23/17 [name] PT, DPT [facility]  [phone]  [facility] [address], [address] [place], [place], [address] Phone: [phone]   Fax:  [phone]  Name: [name] MRN: [id number] Date of Birth: 03-26-1950

## 2017-02-27 DIAGNOSIS — Z9889 Other specified postprocedural states: Secondary | ICD-10-CM | POA: Diagnosis not present

## 2017-03-01 ENCOUNTER — Ambulatory Visit: Payer: 59 | Admitting: Physical Therapy

## 2017-03-01 DIAGNOSIS — M25641 Stiffness of right hand, not elsewhere classified: Secondary | ICD-10-CM

## 2017-03-01 DIAGNOSIS — M6281 Muscle weakness (generalized): Secondary | ICD-10-CM

## 2017-03-01 DIAGNOSIS — M79644 Pain in right finger(s): Secondary | ICD-10-CM

## 2017-03-01 MED FILL — HUMALOG 100 UNITS/ML KWIKPE: 100 | 90 days supply | Qty: 54 | Fill #0

## 2017-03-01 MED FILL — LEVOTHYROXINE 125 MCG TABLE: 125 | 30 days supply | Qty: 30 | Fill #0

## 2017-03-01 NOTE — Therapy (Signed)
[facility] [address], [place], [place], [address] Phone: [phone]   Fax:  [phone]  Physical Therapy Treatment  Patient Details  Name: [name] MRN: [id number] Date of Birth: 1950-04-22 Referring Provider: Melrose Nakayama, MD    Encounter Date: 03/01/2017  PT End of Session - 03/01/17 1227    Visit Number  5    Date for PT Re-Evaluation  03/14/17    Authorization Type  [employer] employee    Authorization Time Period  02/14/17 to 03/14/17    PT Start Time  1351    PT Stop Time  1429    PT Time Calculation (min)  38 min    Activity Tolerance  Patient tolerated treatment well;No increased pain    Behavior During Therapy  WFL for tasks assessed/performed       Past Medical History:  Diagnosis Date  . Anemia    hx of  . Arthritis   . Depression   . Diabetes mellitus [facility]  . Dyspnea    with minimal exertion-deconditioned  . Dysrhythmia    a-fib  . GERD (gastroesophageal reflux disease)    occasional  . History of kidney stones   . Hyperlipidemia   . Hypertension   . Hypothyroidism   . Knee pain, right   . 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 OSA on CPAP    uses CPAP nightly  . Osteoarthritis   . Pinched nerve in neck   . Pneumonia    hx of  . PONV (postoperative nausea and vomiting)    for 3-4 days after general anesthesia  . Swelling of knee joint, right   . Synovitis of knee RIGHT  . Trigger finger, left    left index    Past Surgical History:  Procedure Laterality Date  . CESAREAN SECTION  X3  . KNEE ARTHROSCOPY  04/20/2011   Procedure: ARTHROSCOPY KNEE;  Surgeon: Gearlean Alf, MD;  [facility];  Service: Orthopedics;  Laterality: Right;  WITH SYNOVECTOMY  . LEFT CARPAL TUNNEL / LEFT MIDDLE & RING FINGER TRIGGER RELEASE  08-26-2008  . LEFT SHOULDER ARTHROSCOPY W/ DEBRIDEMENT  09-09-2003  . LEFT SHOULDER ARTHROSCOPY/ LEFT THUMB TRIGGER  RELEASE  02-22-2005  . PULLEY RELEASE LEFT LONG FINGER  07-14-2009  . RIGHT CARPAL TUNNEL/ RIGHT THUMB TRIGGER RELEASE'S  11-28-2006  . RIGHT SHOULDER ARTHROSCOPY W/ ROTATOR CUFF REPAIR  01-13-2004  . SHOULDER ARTHROSCOPY DISTAL CLAVICLE EXCISION AND OPEN ROTATOR CUFF REPAIR  09-07-2004   LEFT  . SHOULDER ARTHROSCOPY W/ ACROMIAL REPAIR  11-29-2005   LEFT  . TONSILLECTOMY AND ADENOIDECTOMY  child  . TOTAL KNEE ARTHROPLASTY  12-14-2009   RIGHT  . TOTAL KNEE ARTHROPLASTY Left 11/05/2012   Procedure: LEFT TOTAL KNEE ARTHROPLASTY;  Surgeon: Gearlean Alf, MD;  Location: [facility];  Service: Orthopedics;  Laterality: Left;  . TOTAL KNEE REVISION Right 07/29/2016   Procedure: RIGHT KNEE POLY-LINER EXCHANGE;  Surgeon: Mcarthur Rossetti, MD;  Location: [facility];  Service: Orthopedics;  Laterality: Right;  . TRIGGER FINGER RELEASE Right 02/21/2013   Procedure: RIGHT RING A-1 PULLEY RELEASE    (MINOR PROCEDURE) ;  Surgeon: Cammie Sickle., MD;  Location: [facility];  Service: Orthopedics;  Laterality: Right;  . TRIGGER FINGER RELEASE Left 09/22/2016   Procedure: RELEASE TRIGGER FINGER LEFT INDEX FINGER;  Surgeon: Mcarthur Rossetti, MD;  Location: [facility];  Service: Orthopedics;  Laterality:  Left;  . TRIGGER FINGER RELEASE Right 01/20/2017   Procedure: RELEASE TRIGGER FINGER/A-1 PULLEY RIGHT INDEX FINGER;  Surgeon: Melrose Nakayama, MD;  Location: [facility];  Service: Orthopedics;  Laterality: Right;    There were no vitals filed for this visit.  Subjective Assessment - 03/01/17 1154    Subjective  Pt reports that her appointment with the physician went well. She thinks her finger seems better and she continues to complete her HEP.     Patient Stated Goals  improve use of her Rt finger and decrease pain     Currently in Pain?  No/denies    Pain Onset  --                 [facility] Adult PT Treatment/Exercise - 03/01/17 0001      Manual Therapy   Joint  Mobilization  Grade III AP and PA mobilization to Rt MCP     Soft tissue mobilization  Rt adductor pollicis, Rt palmar interossi; scar mobilization     Passive ROM  Rt 2nd MCP extension stretch at DIP and PIP        Trigger Point Dry Needling - 03/01/17 1245    Consent Given?  Yes    Education Handout Provided  No Pt received verbal instruction/education    Muscles Treated Upper Body  -- Rt dorsal interossei, adductor pollicis (+) twitch response            PT Education - 03/01/17 1226    Education provided  Yes    Education Details  dry needling     Person(s) Educated  Patient    Methods  Explanation    Comprehension  Verbalized understanding       PT Short Term Goals - 03/01/17 1239      PT SHORT TERM GOAL #1   Title  be independent in initial HEP to improve swelling and decrease pain.    Time  2    Period  Days    Status  Achieved      PT SHORT TERM GOAL #2   Title  Pt will demo improved Rt 2nd MCP passive extension to atleast 20 deg to improve her ability to type on her computer at work.     Baseline  15 deg     Time  2    Period  Weeks    Status  Partially Met        PT Long Term Goals - 02/14/17 1617      PT LONG TERM GOAL #1   Title  be independent in advanced HEP to allow for maintenance of finger ROM and strength at discharge.     Time  4    Period  Weeks    Status  New    Target Date  03/14/17      PT LONG TERM GOAL #2   Title  Pt will report atleast 40% improvement in her finger pain and function from the start of therapy, to allow for increased participation in activity at home and work.     Time  4    Period  Weeks    Status  New      PT LONG TERM GOAL #3   Title  Pt will demo improved Rt pincer grip strength of the 2nd finger to within 5 lb of the Lt, to increase her ability to unlock her door without difficulty.     Time  4    Period  Weeks    Status  New      PT LONG TERM GOAL #4   Title  Pt will demo improved Rt grip strength to  within 5lb force of her LUE.     Time  4    Period  Weeks    Status  New            Plan - 03/01/17 1311    Clinical Impression Statement  Pt arrives without any specific complaints of pain, noting overall decrease in swelling from her evaluation. Session focused on manual techniques to address soft tissue and joint restrictions, including dry needling to the adductor pollicis and dorsal interossei with noted twitch response and increase in tissue relaxation following. Pt does continue to have discomfort with passive and active finger extension, however her range appears to be improving with decreased scar tissue noted as well with palpation. Will plan to follow up with pt 1-2 more visits to ensure she is fully independent with her HEP and provide necessary manual interventions prior to discharge. She was agreeable to this.     Rehab Potential  Good    PT Frequency  Other (comment) 1-2 weeks    PT Duration  4 weeks    PT Treatment/Interventions  ADLs/Self Care Home Management;Cryotherapy;Ultrasound;Moist Heat;Iontophoresis 19m/ml Dexamethasone;Therapeutic activities;Therapeutic exercise;Patient/family education;Manual techniques;Dry needling;Passive range of motion;Scar mobilization    PT Next Visit Plan  re-evaluation and possible d/c with HEP; finger mobility, grip strengthening, manual/ice to address swelling/pain ;  scar mobilization;  ultrasound; maybe dry needling    PT Home Exercise Plan  progress as needed    Consulted and Agree with Plan of Care  Patient       Patient will benefit from skilled therapeutic intervention in order to improve the following deficits and impairments:  Decreased activity tolerance, Increased edema, Impaired flexibility, Hypomobility, Decreased strength, Decreased range of motion, Decreased mobility, Decreased scar mobility, Pain  Visit Diagnosis: Pain in right finger(s)  Stiffness of right hand, not elsewhere classified  Muscle weakness  (generalized)     Problem List Patient Active Problem List   Diagnosis Date Noted  . Acute pain of right knee 12/22/2016  . Trigger index finger of left hand 09/22/2016  . 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 Diabetes ([facility] [place] 01/08/2014  . Essential hypertension 01/08/2014  . Hyperlipidemia 01/08/2014  . Postoperative anemia due to acute blood loss 11/22/2012  . Hyponatremia 11/06/2012  . OA (osteoarthritis) of knee 11/05/2012  . Villonodular synovitis of knee 04/20/2011    1:35 PM,03/01/17 SElly ModenaPT, DPT [facility] [address] [place] [address]: [phone]  Fax:  [phone] Name: BDEVONNE KITCHENMRN: [id number] of Birth: [birth year]

## 2017-03-03 MED FILL — LANTUS 100 UNITS/ML VIAL: 100 | 84 days supply | Qty: 120 | Fill #0

## 2017-03-08 MED FILL — FREESTYLE LITE TEST STRIP: 90 days supply | Qty: 300 | Fill #1

## 2017-03-08 MED FILL — XARELTO 20 MG TABLET: 20 | 30 days supply | Qty: 30 | Fill #5

## 2017-03-09 MED FILL — AMOXICILLIN 500 MG CAPSULE: 500 | 5 days supply | Qty: 20 | Fill #0

## 2017-03-16 ENCOUNTER — Encounter: Payer: Self-pay | Admitting: Physical Therapy

## 2017-03-16 ENCOUNTER — Ambulatory Visit: Payer: 59 | Attending: Orthopaedic Surgery | Admitting: Physical Therapy

## 2017-03-16 DIAGNOSIS — M6281 Muscle weakness (generalized): Secondary | ICD-10-CM | POA: Diagnosis not present

## 2017-03-16 DIAGNOSIS — M79644 Pain in right finger(s): Secondary | ICD-10-CM | POA: Insufficient documentation

## 2017-03-16 DIAGNOSIS — M25641 Stiffness of right hand, not elsewhere classified: Secondary | ICD-10-CM | POA: Diagnosis not present

## 2017-03-16 NOTE — Therapy (Signed)
[facility] [address] [place], [place], [address] Phone: [phone]   Fax:  [phone]  Physical Therapy Treatment  Patient Details  Name: [name] MRN: [id number] Date of Birth: [birth year] Referring Provider: Melrose Nakayama, MD    Encounter Date: 03/16/2017  PT End of Session - 03/16/17 1609    Visit Number  6    Date for PT Re-Evaluation  03/27/17    Authorization Type  [employer] employee    Authorization Time Period  03/15/17 to 03/27/17    PT Start Time  1531    PT Stop Time  1611    PT Time Calculation (min)  40 min    Activity Tolerance  Patient tolerated treatment well;No increased pain    Behavior During Therapy  WFL for tasks assessed/performed       Past Medical History:  Diagnosis Date  . Anemia    hx of  . Arthritis   . Depression   . Diabetes mellitus ORAL MEDS  . Dyspnea    with minimal exertion-deconditioned  . Dysrhythmia    a-fib  . GERD (gastroesophageal reflux disease)    occasional  . History of kidney stones   . Hyperlipidemia   . Hypertension   . Hypothyroidism   . Knee pain, right   . 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 OSA on CPAP    uses CPAP nightly  . Osteoarthritis   . Pinched nerve in neck   . Pneumonia    hx of  . PONV (postoperative nausea and vomiting)    for 3-4 days after general anesthesia  . Swelling of knee joint, right   . Synovitis of knee RIGHT  . Trigger finger, left    left index    Past Surgical History:  Procedure Laterality Date  . CESAREAN SECTION  X3  . KNEE ARTHROSCOPY  04/20/2011   Procedure: ARTHROSCOPY KNEE;  Surgeon: Gearlean Alf, MD;  Location: [facility];  Service: Orthopedics;  Laterality: Right;  WITH SYNOVECTOMY  . LEFT CARPAL TUNNEL / LEFT MIDDLE & RING FINGER TRIGGER RELEASE  08-26-2008  . LEFT SHOULDER ARTHROSCOPY W/ DEBRIDEMENT  09-09-2003  . LEFT SHOULDER ARTHROSCOPY/ LEFT THUMB TRIGGER  RELEASE  02-22-2005  . PULLEY RELEASE LEFT LONG FINGER  07-14-2009  . RIGHT CARPAL TUNNEL/ RIGHT THUMB TRIGGER RELEASE'S  11-28-2006  . RIGHT SHOULDER ARTHROSCOPY W/ ROTATOR CUFF REPAIR  01-13-2004  . SHOULDER ARTHROSCOPY DISTAL CLAVICLE EXCISION AND OPEN ROTATOR CUFF REPAIR  09-07-2004   LEFT  . SHOULDER ARTHROSCOPY W/ ACROMIAL REPAIR  11-29-2005   LEFT  . TONSILLECTOMY AND ADENOIDECTOMY  child  . TOTAL KNEE ARTHROPLASTY  12-14-2009   RIGHT  . TOTAL KNEE ARTHROPLASTY Left 11/05/2012   Procedure: LEFT TOTAL KNEE ARTHROPLASTY;  Surgeon: Gearlean Alf, MD;  Location: [facility];  Service: Orthopedics;  Laterality: Left;  . TOTAL KNEE REVISION Right 07/29/2016   Procedure: RIGHT KNEE POLY-LINER EXCHANGE;  Surgeon: Mcarthur Rossetti, MD;  Location: [facility];  Service: Orthopedics;  Laterality: Right;  . TRIGGER FINGER RELEASE Right 02/21/2013   Procedure: RIGHT RING A-1 PULLEY RELEASE    (MINOR PROCEDURE) ;  Surgeon: Cammie Sickle., MD;  Location: [facility];  Service: Orthopedics;  Laterality: Right;  . TRIGGER FINGER RELEASE Left 09/22/2016   Procedure: RELEASE TRIGGER FINGER LEFT INDEX FINGER;  Surgeon: Mcarthur Rossetti, MD;  Location: [place];  Service: Orthopedics;  Laterality:  Left;  . TRIGGER FINGER RELEASE Right 01/20/2017   Procedure: RELEASE TRIGGER FINGER/A-1 PULLEY RIGHT INDEX FINGER;  Surgeon: Melrose Nakayama, MD;  Location: [place];  Service: Orthopedics;  Laterality: Right;    There were no vitals filed for this visit.  Subjective Assessment - 03/16/17 1534    Subjective  Pt reports that she feels things are improved since her last session, probably 65% better overall. She feels that the swelling has gone down significantly and she is able to move it better. Her primary limitation is gripping and opening jars. She does not have any issues with typing on the computer like she used to. She does think the needling helped some.      Limitations  Other (comment) gripping and opening jars    Patient Stated Goals  improve use of her Rt finger and decrease pain     Currently in Pain?  No/denies         [facility] PT Assessment - 03/16/17 0001      Assessment   Medical Diagnosis  Rt trigger finger release     Referring Provider  Melrose Nakayama, MD     Onset Date/Surgical Date  01/30/17    Hand Dominance  Right    Next MD Visit  03/27/17      Precautions   Precautions  None      Balance Screen   Has the patient fallen in the past 6 months  No    Has the patient had a decrease in activity level because of a fear of falling?   No    Is the patient reluctant to leave their home because of a fear of falling?   No      Home Film/video editor residence      Prior Function   Level of Independence  Independent      Cognition   Overall Cognitive Status  Within Functional Limits for tasks assessed      Observation/Other Assessments   Observations  Swelling of the Rt digit, primarily along the 1st segment      Sensation   Light Touch  Appears Intact      AROM   Right Composite Finger Extension  -- 2nd MCP 15 deg; pain palmar surface of finger     Right Composite Finger Flexion  -- WNL    Left Composite Finger Extension  -- 2nd MCP 18 deg       Strength   Overall Strength Comments  painful finger abduction     Right Hand Grip (lbs)  39.6    Right Hand 3 Point Pinch  13 lbs    Left Hand Grip (lbs)  39.3    Left Hand 3 Point Pinch  18 lbs      Palpation   Palpation comment  scar adhesions along surgical incision; restricted joint mobility of the 2nd Rt MCP joint                   [facility] Adult PT Treatment/Exercise - 03/16/17 0001      Manual Therapy   Soft tissue mobilization  Rt adductor pollicis, Rt palmar interossi; scar mobilization        Trigger Point Dry Needling - 03/16/17 1607    Consent Given?  Yes    Education Handout Provided  No Pt agreeable to verbal  instruction    Muscles Treated Upper Body  -- Rt dorsal interossei, Rt adductor pollcis (+) twitch  respons             PT Short Term Goals - 03/16/17 1612      PT SHORT TERM GOAL #1   Title  be independent in initial HEP to improve swelling and decrease pain.    Time  2    Period  Days    Status  Achieved      PT SHORT TERM GOAL #2   Title  Pt will demo improved Rt 2nd MCP passive extension to atleast 20 deg to improve her ability to type on her computer at work.     Baseline  15 deg     Time  2    Period  Weeks    Status  Partially Met        PT Long Term Goals - 03/16/17 1613      PT LONG TERM GOAL #1   Title  be independent in advanced HEP to allow for maintenance of finger ROM and strength at discharge.     Time  4    Period  Weeks    Status  On-going      PT LONG TERM GOAL #2   Title  Pt will report atleast 40% improvement in her finger pain and function from the start of therapy, to allow for increased participation in activity at home and work.     Baseline  65%    Time  4    Period  Weeks    Status  Achieved      PT LONG TERM GOAL #3   Title  Pt will demo improved Rt pincer grip strength of the 2nd finger to within 5 lb of the Lt, to increase her ability to unlock her door without difficulty.     Baseline  within 5lb    Time  4    Period  Weeks    Status  Achieved      PT LONG TERM GOAL #4   Title  Pt will demo improved Rt grip strength to within 5lb force of her LUE.     Baseline  symmetrical    Time  4    Period  Weeks    Status  Achieved            Plan - 03/16/17 1612    Clinical Impression Statement  Pt is making steady progress towards her goals, reporting an overall improvement of 65% since the start of therapy. She has met several of her short and long term goals as well, with symmetrical grip strength between the Rt and Lt hand noted this session. Her pincer grip strength is also 5lb improved, in addition to decreased scar adhesions  and swelling along the base of the 2nd digit. Pt responded well to dry needling treatment last session and this was completed again in addition to manual techniques in order to improve scar mobility and decrease other soft tissue restrictions. Pt would benefit from 1 more week of skilled PT to allow for establishment of her advanced HEP and to further address limitations in grip strength, ROM, and finger mobility.    Rehab Potential  Good    PT Frequency  Other (comment) 1-2x/week    PT Duration  2 weeks    PT Treatment/Interventions  ADLs/Self Care Home Management;Cryotherapy;Ultrasound;Moist Heat;Iontophoresis '4mg'$ /ml Dexamethasone;Therapeutic activities;Therapeutic exercise;Patient/family education;Manual techniques;Dry needling;Passive range of motion;Scar mobilization    PT Next Visit Plan  establish advanced HEP; scar mobilization, active finger glides, grip strengthening, manual/ice to address  swelling/pain ; dry needling as needed    PT Home Exercise Plan  progress as needed    Consulted and Agree with Plan of Care  Patient       Patient will benefit from skilled therapeutic intervention in order to improve the following deficits and impairments:  Decreased activity tolerance, Increased edema, Impaired flexibility, Hypomobility, Decreased strength, Decreased range of motion, Decreased mobility, Decreased scar mobility, Pain  Visit Diagnosis: Pain in right finger(s) - Plan: PT plan of care cert/re-cert  Stiffness of right hand, not elsewhere classified - Plan: PT plan of care cert/re-cert     Problem List Patient Active Problem List   Diagnosis Date Noted  . Acute pain of right knee 12/22/2016  . Trigger index finger of left hand 09/22/2016  . 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 Diabetes ([place]) 01/08/2014  . Essential  hypertension 01/08/2014  . Hyperlipidemia 01/08/2014  . Postoperative anemia due to acute blood loss 11/22/2012  . Hyponatremia 11/06/2012  . OA (osteoarthritis) of knee 11/05/2012  . Villonodular synovitis of knee 04/20/2011   4:58 PM,03/16/17 Elly Modena PT, DPT [facility] [address], [place], [place], [address] Phone: [phone]   Fax:  [phone]  Name: LOWEN MANSOURI MRN: [id number] Date of Birth: 10-27-1950

## 2017-03-17 MED FILL — TRULICITY 1.5 MG/0.5 ML PEN: 1.5 | 28 days supply | Qty: 2 | Fill #1

## 2017-03-19 MED FILL — DULoxetine HCL 60 MG CPEP: 60 | 30 days supply | Qty: 30 | Fill #3

## 2017-03-19 MED FILL — VALSARTAN 320 MG TABLET: 320 | 30 days supply | Qty: 30 | Fill #2

## 2017-03-21 ENCOUNTER — Ambulatory Visit: Payer: 59 | Admitting: Physical Therapy

## 2017-03-21 DIAGNOSIS — M6281 Muscle weakness (generalized): Secondary | ICD-10-CM

## 2017-03-21 DIAGNOSIS — M79644 Pain in right finger(s): Secondary | ICD-10-CM | POA: Diagnosis not present

## 2017-03-21 DIAGNOSIS — M25641 Stiffness of right hand, not elsewhere classified: Secondary | ICD-10-CM

## 2017-03-21 NOTE — Therapy (Signed)
[facility] [address] [place], [place], [address] Phone: [phone]   Fax:  [phone]  Physical Therapy Treatment  Patient Details  Name: [name] MRN: [id number] Date of Birth: March 13, 1951 Referring Provider: Melrose Nakayama, MD    Encounter Date: 03/21/2017  PT End of Session - 03/21/17 1631    Visit Number  7    Date for PT Re-Evaluation  03/27/17    Authorization Type   employee    Authorization Time Period  03/15/17 to 03/27/17    PT Start Time  1532    PT Stop Time  1613    PT Time Calculation (min)  41 min    Activity Tolerance  Patient tolerated treatment well;No increased pain    Behavior During Therapy  WFL for tasks assessed/performed       Past Medical History:  Diagnosis Date  . Anemia    hx of  . Arthritis   . Depression   . Diabetes mellitus ORAL MEDS  . Dyspnea    with minimal exertion-deconditioned  . Dysrhythmia    a-fib  . GERD (gastroesophageal reflux disease)    occasional  . History of kidney stones   . Hyperlipidemia   . Hypertension   . Hypothyroidism   . Knee pain, right   . 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 OSA on CPAP    uses CPAP nightly  . Osteoarthritis   . Pinched nerve in neck   . Pneumonia    hx of  . PONV (postoperative nausea and vomiting)    for 3-4 days after general anesthesia  . Swelling of knee joint, right   . Synovitis of knee RIGHT  . Trigger finger, left    left index    Past Surgical History:  Procedure Laterality Date  . CESAREAN SECTION  X3  . KNEE ARTHROSCOPY  04/20/2011   Procedure: ARTHROSCOPY KNEE;  Surgeon: Gearlean Alf, MD;  Location: [facility];  Service: Orthopedics;  Laterality: Right;  WITH SYNOVECTOMY  . LEFT CARPAL TUNNEL / LEFT MIDDLE & RING FINGER TRIGGER RELEASE  08-26-2008  . LEFT SHOULDER ARTHROSCOPY W/ DEBRIDEMENT  09-09-2003  . LEFT SHOULDER ARTHROSCOPY/ LEFT THUMB TRIGGER  RELEASE  02-22-2005  . PULLEY RELEASE LEFT LONG FINGER  07-14-2009  . RIGHT CARPAL TUNNEL/ RIGHT THUMB TRIGGER RELEASE'S  11-28-2006  . RIGHT SHOULDER ARTHROSCOPY W/ ROTATOR CUFF REPAIR  01-13-2004  . SHOULDER ARTHROSCOPY DISTAL CLAVICLE EXCISION AND OPEN ROTATOR CUFF REPAIR  09-07-2004   LEFT  . SHOULDER ARTHROSCOPY W/ ACROMIAL REPAIR  11-29-2005   LEFT  . TONSILLECTOMY AND ADENOIDECTOMY  child  . TOTAL KNEE ARTHROPLASTY  12-14-2009   RIGHT  . TOTAL KNEE ARTHROPLASTY Left 11/05/2012   Procedure: LEFT TOTAL KNEE ARTHROPLASTY;  Surgeon: Gearlean Alf, MD;  Location: [facility];  Service: Orthopedics;  Laterality: Left;  . TOTAL KNEE REVISION Right 07/29/2016   Procedure: RIGHT KNEE POLY-LINER EXCHANGE;  Surgeon: Mcarthur Rossetti, MD;  Location: [facility];  Service: Orthopedics;  Laterality: Right;  . TRIGGER FINGER RELEASE Right 02/21/2013   Procedure: RIGHT RING A-1 PULLEY RELEASE    (MINOR PROCEDURE) ;  Surgeon: Cammie Sickle., MD;  Location[facility];  Service: Orthopedics;  Laterality: Right;  . TRIGGER FINGER RELEASE Left 09/22/2016   Procedure: RELEASE TRIGGER FINGER LEFT INDEX FINGER;  Surgeon: Mcarthur Rossetti, MD;  Location: [place];  Service: Orthopedics;  Laterality:  Left;  . TRIGGER FINGER RELEASE Right 01/20/2017   Procedure: RELEASE TRIGGER FINGER/A-1 PULLEY RIGHT INDEX FINGER;  Surgeon: Melrose Nakayama, MD;  Location: [place];  Service: Orthopedics;  Laterality: Right;    There were no vitals filed for this visit.  Subjective Assessment - 03/21/17 1533    Subjective  Pt reports things continue to go well. She noticed some pain improvements overall, but conitnues to have issues with extending his index finger. Gripping has improved some as well.     Limitations  Other (comment) gripping and opening jars    Patient Stated Goals  improve use of her Rt finger and decrease pain     Currently in Pain?  Yes    Pain Score  4     Pain  Location  -- posterior aspect of the Rt index finger    Pain Orientation  Right    Pain Descriptors / Indicators  Sore    Pain Type  Acute pain    Pain Radiating Towards  none     Pain Onset  1 to 4 weeks ago    Pain Frequency  Constant    Aggravating Factors   gripping smaller objects, extending her index finger    Pain Relieving Factors  ice, rest     Effect of Pain on Daily Activities  typing on computers/baking    Multiple Pain Sites  No            [facility] Adult PT Treatment/Exercise - 03/21/17 0001      Hand Exercises   Other Hand Exercises  Rt gripping with 1.4 device 15x5 sec hold; lumbrical grip with 1.4 device x15 reps     Other Hand Exercises  tendon glides x10 reps, Rt hand only      Manual Therapy   Joint Mobilization  Grade III AP Rt MTP mobilization    Soft tissue mobilization  Rt adductor pollicis, Rt palmar interossi; scar mobilization     Passive ROM  Rt 2nd MCP extension stretch at DIP and PIP              PT Education - 03/21/17 1614    Education Details  progressions with strengthening therex; next visit likely being the last     Person(s) Educated  Patient    Methods  Explanation    Comprehension  Verbalized understanding       PT Short Term Goals - 03/16/17 1612      PT SHORT TERM GOAL #1   Title  be independent in initial HEP to improve swelling and decrease pain.    Time  2    Period  Days    Status  Achieved      PT SHORT TERM GOAL #2   Title  Pt will demo improved Rt 2nd MCP passive extension to atleast 20 deg to improve her ability to type on her computer at work.     Baseline  15 deg     Time  2    Period  Weeks    Status  Partially Met        PT Long Term Goals - 03/16/17 1613      PT LONG TERM GOAL #1   Title  be independent in advanced HEP to allow for maintenance of finger ROM and strength at discharge.     Time  4    Period  Weeks    Status  On-going      PT LONG TERM  GOAL #2   Title  Pt will report atleast 40%  improvement in her finger pain and function from the start of therapy, to allow for increased participation in activity at home and work.     Baseline  65%    Time  4    Period  Weeks    Status  Achieved      PT LONG TERM GOAL #3   Title  Pt will demo improved Rt pincer grip strength of the 2nd finger to within 5 lb of the Lt, to increase her ability to unlock her door without difficulty.     Baseline  within 5lb    Time  4    Period  Weeks    Status  Achieved      PT LONG TERM GOAL #4   Title  Pt will demo improved Rt grip strength to within 5lb force of her LUE.     Baseline  symmetrical    Time  4    Period  Weeks    Status  Achieved            Plan - 03/21/17 1632    Clinical Impression Statement  Pt continues to make progress, reporting decreased pain with gripping this past week. This session, she was able to complete gentle hand/finger strengthening without report of increased pain. Completed manual treatment to the intrinsic and joints of the finger, noting improved scar mobility as well. Will plan for d/c to HEP next session.    Rehab Potential  Good    PT Frequency  Other (comment) 1-2x/week    PT Duration  2 weeks    PT Treatment/Interventions  ADLs/Self Care Home Management;Cryotherapy;Ultrasound;Moist Heat;Iontophoresis 22m/ml Dexamethasone;Therapeutic activities;Therapeutic exercise;Patient/family education;Manual techniques;Dry needling;Passive range of motion;Scar mobilization    PT Next Visit Plan  advanced HEP; tendon glides/grip strength progressions    PT Home Exercise Plan  progress as needed    Consulted and Agree with Plan of Care  Patient       Patient will benefit from skilled therapeutic intervention in order to improve the following deficits and impairments:  Decreased activity tolerance, Increased edema, Impaired flexibility, Hypomobility, Decreased strength, Decreased range of motion, Decreased mobility, Decreased scar mobility, Pain  Visit  Diagnosis: Pain in right finger(s)  Stiffness of right hand, not elsewhere classified  Muscle weakness (generalized)     Problem List Patient Active Problem List   Diagnosis Date Noted  . Acute pain of right knee 12/22/2016  . Trigger index finger of left hand 09/22/2016  . 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 Diabetes ([place] 01/08/2014  . Essential hypertension 01/08/2014  . Hyperlipidemia 01/08/2014  . Postoperative anemia due to acute blood loss 11/22/2012  . Hyponatremia 11/06/2012  . OA (osteoarthritis) of knee 11/05/2012  . Villonodular synovitis of knee 04/20/2011   4:38 PM,03/21/17 SElly ModenaPT, DPT [facility] [address] [place] [address]: [phone]  Fax:  [phone] Name: Stefanie TEATERMRN: [id number] of Birth: [birth year]

## 2017-03-23 ENCOUNTER — Ambulatory Visit: Payer: 59 | Admitting: Physical Therapy

## 2017-03-23 ENCOUNTER — Encounter: Payer: Self-pay | Admitting: Physical Therapy

## 2017-03-23 DIAGNOSIS — M6281 Muscle weakness (generalized): Secondary | ICD-10-CM | POA: Diagnosis not present

## 2017-03-23 DIAGNOSIS — M25641 Stiffness of right hand, not elsewhere classified: Secondary | ICD-10-CM | POA: Diagnosis not present

## 2017-03-23 DIAGNOSIS — M79644 Pain in right finger(s): Secondary | ICD-10-CM | POA: Diagnosis not present

## 2017-03-23 NOTE — Patient Instructions (Signed)
   Wrist Tendon Glide  1. Stand/sit with you affected extremity in front of you, with your wrist in neutral and fingers and thumb extended. Hold for 2 seconds.  2. Keeping wrist in neutral, fully bend your first two knuckles, making a "hook fist". Hold for 2 seconds.  3. Keeping wrist in neutral, bend all finger knuckles into a full fist. Hold for 2 seconds. 4. Keeping wrist in neutral, extend the last digit of your fingers, as if you're holding a delicate caterpillar in your hand and don't want to crush it. Hold for 2 seconds.    x10 reps     strengthening ball lumbrical pinch  hold a ball between the fingertips of all digits and your thumb. now, keeping the end two knuckles of your fingers straight, pinch ball between all fingers and thumb.  repeat 20-30 times 1-3 times a day        Lumbrical stretch  place heel of opposite hand on finger nail bed. now gently extend metacarpal joints as shown until you feel stretch in your hand as shown.  Hold 10 sec, repeat 10x.      First Dorsal Interossei Exercise  The thumb is positioned in palmer abduction while the index finger is lifted into adduction.  x10-15 reps.     [facility] [address], [address] [place], [place] [address] Phone # [phone] Fax [phone]

## 2017-03-23 NOTE — Therapy (Signed)
[facility] [address], [place], [place], [address] Phone: [phone]   Fax:  [phone]  Physical Therapy Treatment/Discharge  Patient Details  Name: [name] MRN: [id number] Date of Birth: 08-31-1950 Referring Provider: Melrose Nakayama, MD   Encounter Date: 03/23/2017  PT End of Session - 03/23/17 1540    Visit Number  8    Date for PT Re-Evaluation  03/27/17    Authorization Type  [employer] employee    Authorization Time Period  03/15/17 to 03/27/17    PT Start Time  1523    PT Stop Time  6269    PT Time Calculation (min)  41 min    Activity Tolerance  Patient tolerated treatment well;No increased pain    Behavior During Therapy  WFL for tasks assessed/performed       Past Medical History:  Diagnosis Date  . Anemia    hx of  . Arthritis   . Depression   . Diabetes mellitus ORAL MEDS  . Dyspnea    with minimal exertion-deconditioned  . Dysrhythmia    a-fib  . GERD (gastroesophageal reflux disease)    occasional  . History of kidney stones   . Hyperlipidemia   . Hypertension   . Hypothyroidism   . Knee pain, right   . 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 OSA on CPAP    uses CPAP nightly  . Osteoarthritis   . Pinched nerve in neck   . Pneumonia    hx of  . PONV (postoperative nausea and vomiting)    for 3-4 days after general anesthesia  . Swelling of knee joint, right   . Synovitis of knee RIGHT  . Trigger finger, left    left index    Past Surgical History:  Procedure Laterality Date  . CESAREAN SECTION  X3  . KNEE ARTHROSCOPY  04/20/2011   Procedure: ARTHROSCOPY KNEE;  Surgeon: Gearlean Alf, MD;  Location: [facility];  Service: Orthopedics;  Laterality: Right;  WITH SYNOVECTOMY  . LEFT CARPAL TUNNEL / LEFT MIDDLE & RING FINGER TRIGGER RELEASE  08-26-2008  . LEFT SHOULDER ARTHROSCOPY W/ DEBRIDEMENT  09-09-2003  . LEFT SHOULDER ARTHROSCOPY/ LEFT THUMB  TRIGGER RELEASE  02-22-2005  . PULLEY RELEASE LEFT LONG FINGER  07-14-2009  . RIGHT CARPAL TUNNEL/ RIGHT THUMB TRIGGER RELEASE'S  11-28-2006  . RIGHT SHOULDER ARTHROSCOPY W/ ROTATOR CUFF REPAIR  01-13-2004  . SHOULDER ARTHROSCOPY DISTAL CLAVICLE EXCISION AND OPEN ROTATOR CUFF REPAIR  09-07-2004   LEFT  . SHOULDER ARTHROSCOPY W/ ACROMIAL REPAIR  11-29-2005   LEFT  . TONSILLECTOMY AND ADENOIDECTOMY  child  . TOTAL KNEE ARTHROPLASTY  12-14-2009   RIGHT  . TOTAL KNEE ARTHROPLASTY Left 11/05/2012   Procedure: LEFT TOTAL KNEE ARTHROPLASTY;  Surgeon: Gearlean Alf, MD;  Location: [facility];  Service: Orthopedics;  Laterality: Left;  . TOTAL KNEE REVISION Right 07/29/2016   Procedure: RIGHT KNEE POLY-LINER EXCHANGE;  Surgeon: Mcarthur Rossetti, MD;  Location: [facility];  Service: Orthopedics;  Laterality: Right;  . TRIGGER FINGER RELEASE Right 02/21/2013   Procedure: RIGHT RING A-1 PULLEY RELEASE    (MINOR PROCEDURE) ;  Surgeon: Cammie Sickle., MD;  Location[facility];  Service: Orthopedics;  Laterality: Right;  . TRIGGER FINGER RELEASE Left 09/22/2016   Procedure: RELEASE TRIGGER FINGER LEFT INDEX FINGER;  Surgeon: Mcarthur Rossetti, MD;  Location: [facility];  Service: Orthopedics;  Laterality: Left;  .  TRIGGER FINGER RELEASE Right 01/20/2017   Procedure: RELEASE TRIGGER FINGER/A-1 PULLEY RIGHT INDEX FINGER;  Surgeon: Melrose Nakayama, MD;  Location: [facility];  Service: Orthopedics;  Laterality: Right;    There were no vitals filed for this visit.  Subjective Assessment - 03/23/17 1525    Subjective  Pt reports that she is doing well. She feels that her finger is atleast 80% improved overall. Little to no swelling lately. She has no pain currently.     Limitations  Other (comment) gripping and opening jars    Patient Stated Goals  improve use of her Rt finger and decrease pain     Currently in Pain?  No/denies    Pain Onset  1 to 4 weeks ago          [facility] PT Assessment - 03/23/17 0001      Assessment   Medical Diagnosis  Rt trigger finger release     Referring Provider  Melrose Nakayama, MD    Onset Date/Surgical Date  01/30/17    Hand Dominance  Right    Next MD Visit  03/27/17      Precautions   Precautions  None      Balance Screen   Has the patient fallen in the past 6 months  No    Has the patient had a decrease in activity level because of a fear of falling?   No    Is the patient reluctant to leave their home because of a fear of falling?   No      Home Film/video editor residence      Prior Function   Level of Independence  Independent      Cognition   Overall Cognitive Status  Within Functional Limits for tasks assessed      Observation/Other Assessments   Observations  incision healed, minimal adhesions noted, no swelling around the area      Sensation   Light Touch  Appears Intact      AROM   Right Composite Finger Extension  -- 2nd MCP 25 passive/15 active pain free    Right Composite Finger Flexion  -- WNL    Left Composite Finger Extension  -- 2nd MCP 18 deg       Strength   Overall Strength Comments  painful finger abduction     Right Hand Grip (lbs)  39.6 on 03/16/17    Left Hand Grip (lbs)  39.3 on 03/16/17      Palpation   Palpation comment  non tender with palpation                  [facility] Adult PT Treatment/Exercise - 03/23/17 0001      Hand Exercises   MCPJ Extension  PROM;Right;5 reps    Other Hand Exercises  Rt lumbrical pinch x20 reps using; 1st dorsal interossei strengthening with rubber band x15 reps     Other Hand Exercises  tendon glides x10 reps.              PT Education - 03/23/17 1540    Education provided  Yes    Education Details  goals met and expectations of continued progress and decreased pain with time    Person(s) Educated  Patient    Methods  Explanation    Comprehension  Verbalized understanding       PT Short Term  Goals - 03/23/17 1527      PT SHORT TERM GOAL #  1   Title  be independent in initial HEP to improve swelling and decrease pain.    Time  2    Period  Days    Status  Achieved      PT SHORT TERM GOAL #2   Title  Pt will demo improved Rt 2nd MCP passive extension to atleast 20 deg to improve her ability to type on her computer at work.     Baseline  15 active and 25 passive     Time  2    Period  Weeks    Status  Achieved        PT Long Term Goals - 03/23/17 1529      PT LONG TERM GOAL #1   Title  be independent in advanced HEP to allow for maintenance of finger ROM and strength at discharge.     Time  4    Period  Weeks    Status  Achieved      PT LONG TERM GOAL #2   Title  Pt will report atleast 40% improvement in her finger pain and function from the start of therapy, to allow for increased participation in activity at home and work.     Baseline  80% improved     Time  4    Period  Weeks    Status  Achieved      PT LONG TERM GOAL #3   Title  Pt will demo improved Rt pincer grip strength of the 2nd finger to within 5 lb of the Lt, to increase her ability to unlock her door without difficulty.     Baseline  within 5lb    Time  4    Period  Weeks    Status  Achieved      PT LONG TERM GOAL #4   Title  Pt will demo improved Rt grip strength to within 5lb force of her LUE.     Baseline  symmetrical    Time  4    Period  Weeks    Status  Achieved            Plan - 03/23/17 1602    Clinical Impression Statement  Pt was discharged this visit having met all of her short and long term goals. She presents with minimal to no swelling over the past week or so, improved 2nd digit passive extension to atleast 25 deg pain free, and improved grip strength/symmetry between the Lt/Rt hand. She does have occasional pain with end ranges of MCP extension, however this is less than at eval and she feels 80% improved overall. Therapist reviewed a detailed HEP with her this visit and  she demonstrated good understanding of all exercises. She is comfortable with d/c to allow her to continue with her HEP moving forward.     Rehab Potential  Good    PT Frequency  Other (comment) 1-2x/week    PT Duration  2 weeks    PT Treatment/Interventions  ADLs/Self Care Home Management;Cryotherapy;Ultrasound;Moist Heat;Iontophoresis 32m/ml Dexamethasone;Therapeutic activities;Therapeutic exercise;Patient/family education;Manual techniques;Dry needling;Passive range of motion;Scar mobilization    PT Next Visit Plan  d/c home with HEP    PT Home Exercise Plan  progress as needed    Consulted and Agree with Plan of Care  Patient       Patient will benefit from skilled therapeutic intervention in order to improve the following deficits and impairments:  Decreased activity tolerance, Increased edema, Impaired flexibility, Hypomobility, Decreased strength, Decreased range of  motion, Decreased mobility, Decreased scar mobility, Pain  Visit Diagnosis: Pain in right finger(s)  Stiffness of right hand, not elsewhere classified  Muscle weakness (generalized)     Problem List Patient Active Problem List   Diagnosis Date Noted  . Acute pain of right knee 12/22/2016  . Trigger index finger of left hand 09/22/2016  . 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 Diabetes ([place]) 01/08/2014  . Essential hypertension 01/08/2014  . Hyperlipidemia 01/08/2014  . Postoperative anemia due to acute blood loss 11/22/2012  . Hyponatremia 11/06/2012  . OA (osteoarthritis) of knee 11/05/2012  . Villonodular synovitis of knee 04/20/2011    PHYSICAL THERAPY DISCHARGE SUMMARY  Visits from Start of Care: 8  Current functional level related to goals / functional outcomes: See above for more details    Remaining deficits: See above for more details     Education / Equipment: See above for more details  Plan: Patient agrees to discharge.  Patient goals were met. Patient is being discharged due to meeting the stated rehab goals.  ?????    4:08 PM,03/23/17 Elly Modena PT, DPT [facility] [address], [place], [place], [address] Phone: [phone]   Fax:  [phone]  Name: [name] MRN: [id number] Date of Birth: Feb 14, 1951

## 2017-03-27 DIAGNOSIS — Z9889 Other specified postprocedural states: Secondary | ICD-10-CM | POA: Diagnosis not present

## 2017-04-05 MED FILL — LEVOTHYROXINE 125 MCG TABLE: 125 | 30 days supply | Qty: 30 | Fill #1

## 2017-04-05 MED FILL — HYDROCHLOROTHIAZIDE 12.5 MG: 12.5 | 90 days supply | Qty: 90 | Fill #1

## 2017-04-06 MED FILL — FLECAINIDE ACETATE 50 MG TA: 50 | 90 days supply | Qty: 180 | Fill #3

## 2017-04-06 MED FILL — ATORVASTATIN 40 MG TABLET: 40 | 90 days supply | Qty: 90 | Fill #3

## 2017-04-06 MED FILL — XARELTO 20 MG TABLET: 20 | 30 days supply | Qty: 30 | Fill #6

## 2017-04-06 MED FILL — METFORMIN HCL ER 500 MG TAB: 500 | 90 days supply | Qty: 180 | Fill #2

## 2017-04-12 MED FILL — TRULICITY 1.5 MG/0.5 ML PEN: 1.5 | 28 days supply | Qty: 2 | Fill #2

## 2017-04-14 DIAGNOSIS — E113299 Type 2 diabetes mellitus with mild nonproliferative diabetic retinopathy without macular edema, unspecified eye: Secondary | ICD-10-CM | POA: Diagnosis not present

## 2017-04-14 DIAGNOSIS — Z7984 Long term (current) use of oral hypoglycemic drugs: Secondary | ICD-10-CM | POA: Diagnosis not present

## 2017-04-25 MED FILL — VALSARTAN 320 MG TAB: 320 | 30 days supply | Qty: 30 | Fill #3

## 2017-04-25 MED FILL — DULoxetine HCL 60 MG CPEP: 60 | 30 days supply | Qty: 30 | Fill #4

## 2017-05-02 MED FILL — XARELTO 20 MG TABLET: 20 | 30 days supply | Qty: 30 | Fill #7

## 2017-05-02 MED FILL — LEVOTHYROXINE 125 MCG TABLE: 125 | 30 days supply | Qty: 30 | Fill #2

## 2017-05-02 MED FILL — OMEPRAZOLE 20 MG CAP: 20 | 90 days supply | Qty: 90 | Fill #2

## 2017-05-02 MED FILL — METOPROLOL TARTRATE 25 MG T: 25 | 90 days supply | Qty: 180 | Fill #3

## 2017-05-11 MED FILL — TRULICITY 1.5 MG/0.5 ML PEN: 1.5 | 28 days supply | Qty: 2 | Fill #3

## 2017-05-15 NOTE — Progress Notes (Signed)
Cardiology Office Note   Date:  05/17/2017   ID:  [name], DOB 08-13-1950, MRN [id number]  PCP:  Marden Noble, MD  Cardiologist:   Charlton Haws, MD   No chief complaint on file.     History of Present Illness:  67 y.o. with PAF on beta blocker , flecainide and xarelto. Echo 06/06/16 with EF 65-70% no valve disease normal LA size, Holter 07/06/16 frequent PAF correlating with palpitations. Myovue 07/21/16 EF 64 % with no ischemia. DM poorly controlled Sees DR Sharl Ma for this and thyroid. Had redo right TKR with DR Magnus Ivan 09/22/16 held xarelto for two days no issues. More recent trigger finger surgery Dalldorf 01/20/17   Complains of more exertional dyspnea. No chest pain. Does more walking at job [employer] and feels more fatigued. Weight is up.   Past Medical History:  Diagnosis Date  . Anemia    hx of  . Arthritis   . Depression   . Diabetes mellitus ORAL MEDS  . Dyspnea    with minimal exertion-deconditioned  . Dysrhythmia    a-fib  . GERD (gastroesophageal reflux disease)    occasional  . History of kidney stones   . Hyperlipidemia   . Hypertension   . Hypothyroidism   . Knee pain, right   . 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 OSA on CPAP    uses CPAP nightly  . Osteoarthritis   . Pinched nerve in neck   . Pneumonia    hx of  . PONV (postoperative nausea and vomiting)    for 3-4 days after general anesthesia  . Swelling of knee joint, right   . Synovitis of knee RIGHT  . Trigger finger, left    left index    Past Surgical History:  Procedure Laterality Date  . CESAREAN SECTION  X3  . KNEE ARTHROSCOPY  04/20/2011   Procedure: ARTHROSCOPY KNEE;  Surgeon: Loanne Drilling, MD;  Location: [facility];  Service: Orthopedics;  Laterality: Right;  WITH SYNOVECTOMY  . LEFT CARPAL TUNNEL / LEFT MIDDLE & RING FINGER TRIGGER RELEASE  08-26-2008  . LEFT SHOULDER ARTHROSCOPY W/ DEBRIDEMENT  09-09-2003  . LEFT SHOULDER ARTHROSCOPY/ LEFT  THUMB TRIGGER RELEASE  02-22-2005  . PULLEY RELEASE LEFT LONG FINGER  07-14-2009  . RIGHT CARPAL TUNNEL/ RIGHT THUMB TRIGGER RELEASE'S  11-28-2006  . RIGHT SHOULDER ARTHROSCOPY W/ ROTATOR CUFF REPAIR  01-13-2004  . SHOULDER ARTHROSCOPY DISTAL CLAVICLE EXCISION AND OPEN ROTATOR CUFF REPAIR  09-07-2004   LEFT  . SHOULDER ARTHROSCOPY W/ ACROMIAL REPAIR  11-29-2005   LEFT  . TONSILLECTOMY AND ADENOIDECTOMY  child  . TOTAL KNEE ARTHROPLASTY  12-14-2009   RIGHT  . TOTAL KNEE ARTHROPLASTY Left 11/05/2012   Procedure: LEFT TOTAL KNEE ARTHROPLASTY;  Surgeon: Loanne Drilling, MD;  Location: [facility];  Service: Orthopedics;  Laterality: Left;  . TOTAL KNEE REVISION Right 07/29/2016   Procedure: RIGHT KNEE POLY-LINER EXCHANGE;  Surgeon: Kathryne Hitch, MD;  Location: [facility];  Service: Orthopedics;  Laterality: Right;  . TRIGGER FINGER RELEASE Right 02/21/2013   Procedure: RIGHT RING A-1 PULLEY RELEASE    (MINOR PROCEDURE) ;  Surgeon: Wyn Forster., MD;  Location: [facility];  Service: Orthopedics;  Laterality: Right;  . TRIGGER FINGER RELEASE Left 09/22/2016   Procedure: RELEASE TRIGGER FINGER LEFT INDEX FINGER;  Surgeon: Kathryne Hitch, MD;  Location: [facility];  Service: Orthopedics;  Laterality: Left;  . TRIGGER  FINGER RELEASE Right 01/20/2017   Procedure: RELEASE TRIGGER FINGER/A-1 PULLEY RIGHT INDEX FINGER;  Surgeon: Marcene Corningalldorf, Everitt Wenner, MD;  Location: [facility];  Service: Orthopedics;  Laterality: Right;     Current Outpatient Medications  Medication Sig Dispense Refill  . amLODipine (NORVASC) 5 MG tablet Take 5 mg by mouth every evening.     [name]. amoxicillin (AMOXIL) 500 MG capsule as directed. PRIOR TO DENTAL PROCEDURES  0  . atorvastatin (LIPITOR) 40 MG tablet Take 40 mg by mouth every evening.     . cholecalciferol (VITAMIN D) 1000 units tablet Take 1,000 Units by mouth every evening.     . docusate sodium (COLACE) 100 MG capsule Take 100 mg by mouth  daily as needed for mild constipation.    . DULoxetine (CYMBALTA) 60 MG capsule Take 60 mg by mouth daily.    . flecainide (TAMBOCOR) 50 MG tablet Take 1 tablet (50 mg total) by mouth 2 (two) times daily. 180 tablet 3  . hydrochlorothiazide (MICROZIDE) 12.5 MG capsule Take 1 capsule by mouth daily.  3  . indomethacin (INDOCIN) 50 MG capsule Take 50 mg by mouth 3 (three) times daily as needed (gout).    . insulin glargine (LANTUS) 100 UNIT/ML injection Inject 60 Units into the skin 2 (two) times daily.     . insulin lispro (HUMALOG) 100 UNIT/ML cartridge Inject 20 Units into the skin 3 (three) times daily with meals. UNLESS BLOOD SUGAR IS 100 OR LESS    . ketorolac (TORADOL) 10 MG tablet Take 10 mg by mouth 3 (three) times daily as needed (CHEST WALL PAIN/PLURACY).    [name]. levothyroxine (SYNTHROID, LEVOTHROID) 112 MCG tablet Take 112 mcg by mouth daily before breakfast.    . Melatonin 5 MG CAPS Take 5 mg by mouth at bedtime as needed (sleep).     . metFORMIN (GLUCOPHAGE-XR) 500 MG 24 hr tablet Take 1,000 mg by mouth daily with supper.    . methocarbamol (ROBAXIN) 500 MG tablet TAKE 1 TABLET BY MOUTH EVERY 6 HOURS AS NEEDED FOR MUSCLE SPASMS 60 tablet 0  . metoprolol tartrate (LOPRESSOR) 25 MG tablet Take 1 tablet (25 mg total) by mouth 2 (two) times daily. 180 tablet 3  . omeprazole (PRILOSEC) 20 MG capsule Take 20 mg by mouth every evening.     . TRULICITY 1.5 MG/0.5ML SOPN as directed.  11  . valsartan (DIOVAN) 320 MG tablet Take 1 tablet by mouth daily.  3  . XARELTO 20 MG TABS tablet TAKE 1 TABLET BY MOUTH ONCE DAILY WITH SUPPER 30 tablet 11   No current facility-administered medications for this visit.     Allergies:   Actos [pioglitazone hydrochloride]; Avandia [rosiglitazone]; Tetanus toxoids; and Food    Social History:  The patient  reports that she quit smoking about 40 years ago. Her smoking use included cigarettes. She quit after 5.00 years of use. she has never used smokeless  tobacco. She reports that she does not drink alcohol or use drugs.   Family History:  The patient's family history includes Diabetes in her mother; Heart attack in her mother; Hypertension in her mother.    ROS:  Please see the history of present illness.   Otherwise, review of systems are positive for none.   All other systems are reviewed and negative.    PHYSICAL EXAM: VS:  BP 136/76   Pulse 72   Ht 5\' 2"  (1.575 m)   Wt 267 lb (121.1 kg)   SpO2 95%  BMI 48.83 kg/m  , BMI Body mass index is 48.83 kg/m. Affect appropriate Obese white female  HEENT: normal Neck supple with no adenopathy JVP normal no bruits no thyromegaly Lungs clear with no wheezing and good diaphragmatic motion Heart:  S1/S2 no murmur, no rub, gallop or click PMI normal Abdomen: benighn, BS positve, no tenderness, no AAA no bruit.  No HSM or HJR Distal pulses intact with no bruits No edema Neuro non-focal Skin warm and dry No muscular weakness Right index finger trigger surgery  Right TKR     EKG:   06/30/16 SR rate 70 nonspecific ST changes QT normal 412    Recent Labs: 09/22/2016: Hemoglobin 12.3; Platelets 284 01/17/2017: BUN 29; Creatinine, Ser 1.10; Potassium 3.9; Sodium 136    Lipid Panel No results found for: CHOL, TRIG, HDL, CHOLHDL, VLDL, LDLCALC, LDLDIRECT    Wt Readings from Last 3 Encounters:  05/17/17 267 lb (121.1 kg)  01/17/17 269 lb (122 kg)  10/21/16 262 lb 3.2 oz (118.9 kg)      Other studies Reviewed: Additional studies/ records that were reviewed today include: Notes Dr Kevan Ny labs and Echo done in March. Myovue May 2018 and holter April 2018    ASSESSMENT AND PLAN:  1. PAF:  Improved with beta blocker and flecainide CHADVASC 4 on xarelto 2. DM:  Discussed low carb diet.  Target hemoglobin A1c is 6.5 or less.  Continue current medications. f/u Dr Sharl Ma 3. HTN:  Well controlled.  Continue current medications and low sodium Dash type diet.   4. Chol  Continue statin  labs with primary  5 OSA:  Continue CPAP discussed benefits of weight loss 6. Orhto: f/u Magnus Ivan and Dalldorf has done fine holding xarelto for surgeries  7. Dyspnea likely functional but given recent pleurisy will check CXR also echo BMET Hct and BNP  Note in office resting sat 97% after ambulation remained in the 92-94% range     Disposition:   FU with me in a year    [employer]

## 2017-05-17 ENCOUNTER — Ambulatory Visit: Payer: 59 | Admitting: Cardiovascular Disease

## 2017-05-17 ENCOUNTER — Encounter: Payer: Self-pay | Admitting: Cardiovascular Disease

## 2017-05-17 VITALS — BP 136/76 | HR 72 | Ht 62.0 in | Wt 267.0 lb

## 2017-05-17 DIAGNOSIS — R091 Pleurisy: Secondary | ICD-10-CM | POA: Diagnosis not present

## 2017-05-17 DIAGNOSIS — R0600 Dyspnea, unspecified: Secondary | ICD-10-CM

## 2017-05-17 NOTE — Patient Instructions (Addendum)
Medication Instructions:  Your physician recommends that you continue on your current medications as directed. Please refer to the Current Medication list given to you today.  Labwork: Your physician recommends that you have lab work today- BMET, BNP, Hgb and Hct.   Testing/Procedures: Your physician has requested that you have an echocardiogram. Echocardiography is a painless test that uses sound waves to create images of your heart. It provides your doctor with information about the size and shape of your heart and how well your heart's chambers and valves are working. This procedure takes approximately one hour. There are no restrictions for this procedure.  A chest x-ray takes a picture of the organs and structures inside the chest, including the heart, lungs, and blood vessels. This test can show several things, including, whether the heart is enlarges; whether fluid is building up in the lungs; and whether pacemaker / defibrillator leads are still in place. At [facility].  Follow-Up: Your physician wants you to follow-up in: 12 months with Dr. Eden EmmsNishan. You will receive a reminder letter in the mail two months in advance. If you don't receive a letter, please call our office to schedule the follow-up appointment.   If you need a refill on your cardiac medications before your next appointment, please call your pharmacy.

## 2017-05-17 NOTE — Addendum Note (Signed)
Addended by: Virl AxePATE INGALLS, Wynette [place] L on: 05/17/2017 05:05 PM   Modules accepted: Orders

## 2017-05-18 LAB — BASIC METABOLIC PANEL
BUN/Creatinine Ratio: 25 (ref 12–28)
BUN: 31 mg/dL — ABNORMAL HIGH (ref 8–27)
CO2: 22 mmol/L (ref 20–29)
Calcium: 9.6 mg/dL (ref 8.7–10.3)
Chloride: 101 mmol/L (ref 96–106)
Creatinine, Ser: 1.25 mg/dL — ABNORMAL HIGH (ref 0.57–1.00)
GFR calc Af Amer: 51 mL/min/{1.73_m2} — ABNORMAL LOW (ref >59)
GFR calc non Af Amer: 44 mL/min/{1.73_m2} — ABNORMAL LOW (ref >59)
Glucose: 160 mg/dL — ABNORMAL HIGH (ref 65–99)
Potassium: 5 mmol/L (ref 3.5–5.2)
Sodium: 142 mmol/L (ref 134–144)

## 2017-05-18 LAB — HEMOGLOBIN: HEMOGLOBIN: 12.2 g/dL (ref 11.1–15.9)

## 2017-05-18 LAB — PRO B NATRIURETIC PEPTIDE: NT-Pro BNP: 65 pg/mL (ref 0–301)

## 2017-05-18 LAB — HEMATOCRIT: Hematocrit: 37.3 % (ref 34.0–46.6)

## 2017-05-19 ENCOUNTER — Other Ambulatory Visit: Payer: Self-pay

## 2017-05-19 DIAGNOSIS — E785 Hyperlipidemia, unspecified: Secondary | ICD-10-CM

## 2017-05-22 ENCOUNTER — Ambulatory Visit
Admission: RE | Admit: 2017-05-22 | Discharge: 2017-05-22 | Disposition: A | Discharge status: Home / Self Care | Payer: 59 | Source: Ambulatory Visit | Attending: Cardiovascular Disease | Admitting: Cardiovascular Disease

## 2017-05-22 DIAGNOSIS — R0600 Dyspnea, unspecified: Secondary | ICD-10-CM

## 2017-05-22 DIAGNOSIS — R0602 Shortness of breath: Secondary | ICD-10-CM | POA: Diagnosis not present

## 2017-05-22 DIAGNOSIS — R091 Pleurisy: Secondary | ICD-10-CM

## 2017-05-25 ENCOUNTER — Ambulatory Visit (HOSPITAL_COMMUNITY): Payer: 59 | Attending: Cardiovascular Disease

## 2017-05-25 ENCOUNTER — Other Ambulatory Visit: Payer: Self-pay

## 2017-05-25 DIAGNOSIS — R0600 Dyspnea, unspecified: Secondary | ICD-10-CM | POA: Insufficient documentation

## 2017-05-25 DIAGNOSIS — E039 Hypothyroidism, unspecified: Secondary | ICD-10-CM | POA: Diagnosis not present

## 2017-05-25 DIAGNOSIS — E119 Type 2 diabetes mellitus without complications: Secondary | ICD-10-CM | POA: Insufficient documentation

## 2017-05-25 DIAGNOSIS — G4733 Obstructive sleep apnea (adult) (pediatric): Secondary | ICD-10-CM | POA: Diagnosis not present

## 2017-05-25 DIAGNOSIS — I119 Hypertensive heart disease without heart failure: Secondary | ICD-10-CM | POA: Insufficient documentation

## 2017-05-25 DIAGNOSIS — E785 Hyperlipidemia, unspecified: Secondary | ICD-10-CM | POA: Diagnosis not present

## 2017-05-25 DIAGNOSIS — R091 Pleurisy: Secondary | ICD-10-CM | POA: Diagnosis not present

## 2017-05-25 DIAGNOSIS — D649 Anemia, unspecified: Secondary | ICD-10-CM | POA: Diagnosis not present

## 2017-05-26 ENCOUNTER — Telehealth (INDEPENDENT_AMBULATORY_CARE_PROVIDER_SITE_OTHER): Payer: Self-pay | Admitting: Orthopaedic Surgery

## 2017-05-26 DIAGNOSIS — E113299 Type 2 diabetes mellitus with mild nonproliferative diabetic retinopathy without macular edema, unspecified eye: Secondary | ICD-10-CM | POA: Diagnosis not present

## 2017-05-26 DIAGNOSIS — E039 Hypothyroidism, unspecified: Secondary | ICD-10-CM | POA: Diagnosis not present

## 2017-05-26 DIAGNOSIS — Z5181 Encounter for therapeutic drug level monitoring: Secondary | ICD-10-CM | POA: Diagnosis not present

## 2017-05-26 DIAGNOSIS — Z8639 Personal history of other endocrine, nutritional and metabolic disease: Secondary | ICD-10-CM | POA: Diagnosis not present

## 2017-05-26 DIAGNOSIS — Z794 Long term (current) use of insulin: Secondary | ICD-10-CM | POA: Diagnosis not present

## 2017-05-26 DIAGNOSIS — E1065 Type 1 diabetes mellitus with hyperglycemia: Secondary | ICD-10-CM | POA: Diagnosis not present

## 2017-05-26 MED ORDER — HYDROCODONE-ACETAMINOPHEN 5-325 MG PO TABS: 1.0000 | ORAL_TABLET | Freq: Four times a day (QID) | ORAL | 0 refills | Status: DC | PRN

## 2017-05-26 MED FILL — XARELTO 20 MG TABLET: 20 | 30 days supply | Qty: 30 | Fill #8

## 2017-05-26 MED FILL — AMLODIPINE BESYLATE 5 MG TA: 5 | 90 days supply | Qty: 90 | Fill #0

## 2017-05-26 MED FILL — DULoxetine HCL 60 MG CPEP: 60 | 30 days supply | Qty: 30 | Fill #5

## 2017-05-26 MED FILL — VALSARTAN 320 MG TAB: 320 | 30 days supply | Qty: 30 | Fill #4

## 2017-05-26 NOTE — Telephone Encounter (Signed)
I escribed some in 

## 2017-05-26 NOTE — Telephone Encounter (Signed)
Patient called requesting a refill on the Hydrocodone.  Please call patient to advise.

## 2017-05-26 NOTE — Telephone Encounter (Signed)
Refill request hydrocodone

## 2017-05-29 MED FILL — HYDROCODON-APAP 5-325: 5-325 | 8 days supply | Qty: 30 | Fill #0

## 2017-06-07 MED FILL — TRULICITY 1.5 MG/0.5 ML PEN: 1.5 | 28 days supply | Qty: 2 | Fill #4

## 2017-06-20 ENCOUNTER — Other Ambulatory Visit (INDEPENDENT_AMBULATORY_CARE_PROVIDER_SITE_OTHER): Payer: Self-pay | Admitting: Orthopaedic Surgery

## 2017-06-20 MED FILL — METHOCARBAMOL 500 MG TABS: 500 | 15 days supply | Qty: 60 | Fill #0

## 2017-06-20 MED FILL — INDOMETHACIN 50 MG CAPSULE: 50 | 6 days supply | Qty: 20 | Fill #0

## 2017-06-20 NOTE — Telephone Encounter (Signed)
Please advise 

## 2017-06-26 MED FILL — VALSARTAN 320 MG TAB: 320 | 30 days supply | Qty: 30 | Fill #5

## 2017-06-26 MED FILL — LEVOTHYROXINE 125 MCG TABLE: 125 | 30 days supply | Qty: 30 | Fill #3

## 2017-06-26 MED FILL — LANTUS 100 UNITS/ML VIAL: 100 | 84 days supply | Qty: 120 | Fill #1

## 2017-06-26 MED FILL — HUMALOG 100 UNITS/ML KWIKPE: 100 | 90 days supply | Qty: 54 | Fill #1

## 2017-06-27 MED FILL — DULoxetine HCL 60 MG CPEP: 60 | 30 days supply | Qty: 30 | Fill #0

## 2017-07-04 MED FILL — TRULICITY 1.5 MG/0.5 ML PEN: 1.5 | 28 days supply | Qty: 2 | Fill #5

## 2017-07-05 ENCOUNTER — Other Ambulatory Visit: Payer: Self-pay | Admitting: Cardiovascular Disease

## 2017-07-05 MED FILL — XARELTO 20 MG TABLET: 20 | 30 days supply | Qty: 30 | Fill #9

## 2017-07-05 MED FILL — ATORVASTATIN 40 MG TABLET: 40 | 90 days supply | Qty: 90 | Fill #0

## 2017-07-06 ENCOUNTER — Encounter (INDEPENDENT_AMBULATORY_CARE_PROVIDER_SITE_OTHER): Payer: Self-pay

## 2017-07-07 MED FILL — FLECAINIDE ACETATE 50 MG TA: 50 | 90 days supply | Qty: 180 | Fill #0

## 2017-07-12 DIAGNOSIS — G4733 Obstructive sleep apnea (adult) (pediatric): Secondary | ICD-10-CM | POA: Diagnosis not present

## 2017-07-12 DIAGNOSIS — I10 Essential (primary) hypertension: Secondary | ICD-10-CM | POA: Diagnosis not present

## 2017-07-12 DIAGNOSIS — Z139 Encounter for screening, unspecified: Secondary | ICD-10-CM | POA: Diagnosis not present

## 2017-07-12 DIAGNOSIS — I4891 Unspecified atrial fibrillation: Secondary | ICD-10-CM | POA: Diagnosis not present

## 2017-07-12 DIAGNOSIS — I519 Heart disease, unspecified: Secondary | ICD-10-CM | POA: Diagnosis not present

## 2017-07-12 DIAGNOSIS — M109 Gout, unspecified: Secondary | ICD-10-CM | POA: Diagnosis not present

## 2017-07-12 DIAGNOSIS — E113213 Type 2 diabetes mellitus with mild nonproliferative diabetic retinopathy with macular edema, bilateral: Secondary | ICD-10-CM | POA: Diagnosis not present

## 2017-07-12 DIAGNOSIS — M179 Osteoarthritis of knee, unspecified: Secondary | ICD-10-CM | POA: Diagnosis not present

## 2017-07-12 DIAGNOSIS — Z23 Encounter for immunization: Secondary | ICD-10-CM | POA: Diagnosis not present

## 2017-07-12 DIAGNOSIS — Z79899 Other long term (current) drug therapy: Secondary | ICD-10-CM | POA: Diagnosis not present

## 2017-07-12 DIAGNOSIS — R0609 Other forms of dyspnea: Secondary | ICD-10-CM | POA: Diagnosis not present

## 2017-07-12 DIAGNOSIS — R600 Localized edema: Secondary | ICD-10-CM | POA: Diagnosis not present

## 2017-07-12 DIAGNOSIS — Z0184 Encounter for antibody response examination: Secondary | ICD-10-CM | POA: Diagnosis not present

## 2017-07-18 ENCOUNTER — Encounter (INDEPENDENT_AMBULATORY_CARE_PROVIDER_SITE_OTHER): Payer: Self-pay | Admitting: Family Medicine

## 2017-07-18 ENCOUNTER — Ambulatory Visit (INDEPENDENT_AMBULATORY_CARE_PROVIDER_SITE_OTHER): Payer: 59 | Admitting: Family Medicine

## 2017-07-18 VITALS — BP 121/66 | HR 63 | Temp 98.1°F | Ht 61.0 in | Wt 265.0 lb

## 2017-07-18 DIAGNOSIS — E559 Vitamin D deficiency, unspecified: Secondary | ICD-10-CM | POA: Diagnosis not present

## 2017-07-18 DIAGNOSIS — E538 Deficiency of other specified B group vitamins: Secondary | ICD-10-CM | POA: Insufficient documentation

## 2017-07-18 DIAGNOSIS — R0602 Shortness of breath: Secondary | ICD-10-CM | POA: Diagnosis not present

## 2017-07-18 DIAGNOSIS — E11319 Type 2 diabetes mellitus with unspecified diabetic retinopathy without macular edema: Secondary | ICD-10-CM | POA: Diagnosis not present

## 2017-07-18 DIAGNOSIS — Z794 Long term (current) use of insulin: Secondary | ICD-10-CM | POA: Diagnosis not present

## 2017-07-18 DIAGNOSIS — Z6841 Body Mass Index (BMI) 40.0 and over, adult: Secondary | ICD-10-CM

## 2017-07-18 DIAGNOSIS — Z0289 Encounter for other administrative examinations: Secondary | ICD-10-CM

## 2017-07-18 DIAGNOSIS — E038 Other specified hypothyroidism: Secondary | ICD-10-CM | POA: Insufficient documentation

## 2017-07-18 DIAGNOSIS — Z1331 Encounter for screening for depression: Secondary | ICD-10-CM

## 2017-07-18 DIAGNOSIS — R5383 Other fatigue: Secondary | ICD-10-CM | POA: Diagnosis not present

## 2017-07-19 LAB — CBC WITH DIFFERENTIAL
BASOS: 0 %
Basophils Absolute: 0 10*3/uL (ref 0.0–0.2)
EOS (ABSOLUTE): 0.1 10*3/uL (ref 0.0–0.4)
EOS: 1 %
HEMATOCRIT: 39.6 % (ref 34.0–46.6)
HEMOGLOBIN: 13 g/dL (ref 11.1–15.9)
IMMATURE GRANS (ABS): 0 10*3/uL (ref 0.0–0.1)
IMMATURE GRANULOCYTES: 0 %
Lymphocytes Absolute: 1.8 10*3/uL (ref 0.7–3.1)
Lymphs: 18 %
MCH: 28.4 pg (ref 26.6–33.0)
MCHC: 32.8 g/dL (ref 31.5–35.7)
MCV: 87 fL (ref 79–97)
MONOCYTES: 8 %
MONOS ABS: 0.8 10*3/uL (ref 0.1–0.9)
Neutrophils Absolute: 7.2 10*3/uL — ABNORMAL HIGH (ref 1.4–7.0)
Neutrophils: 73 %
RBC: 4.57 x10E6/uL (ref 3.77–5.28)
RDW: 15.3 % (ref 12.3–15.4)
WBC: 10 10*3/uL (ref 3.4–10.8)

## 2017-07-19 LAB — COMPREHENSIVE METABOLIC PANEL
ALBUMIN: 4.3 g/dL (ref 3.6–4.8)
ALT: 20 IU/L (ref 0–32)
AST: 25 IU/L (ref 0–40)
Albumin/Globulin Ratio: 1.3 (ref 1.2–2.2)
Alkaline Phosphatase: 83 IU/L (ref 39–117)
BUN / CREAT RATIO: 24 (ref 12–28)
BUN: 31 mg/dL — ABNORMAL HIGH (ref 8–27)
Bilirubin Total: 0.3 mg/dL (ref 0.0–1.2)
CALCIUM: 9.9 mg/dL (ref 8.7–10.3)
CO2: 22 mmol/L (ref 20–29)
Chloride: 102 mmol/L (ref 96–106)
Creatinine, Ser: 1.3 mg/dL — ABNORMAL HIGH (ref 0.57–1.00)
GFR, EST AFRICAN AMERICAN: 49 mL/min/{1.73_m2} — AB (ref >59)
GFR, EST NON AFRICAN AMERICAN: 42 mL/min/{1.73_m2} — AB (ref >59)
GLOBULIN, TOTAL: 3.4 g/dL (ref 1.5–4.5)
Glucose: 95 mg/dL (ref 65–99)
POTASSIUM: 4.3 mmol/L (ref 3.5–5.2)
SODIUM: 140 mmol/L (ref 134–144)
TOTAL PROTEIN: 7.7 g/dL (ref 6.0–8.5)

## 2017-07-19 LAB — T3: T3, Total: 110 ng/dL (ref 71–180)

## 2017-07-19 LAB — LIPID PANEL WITH LDL/HDL RATIO
Cholesterol, Total: 126 mg/dL (ref 100–199)
HDL: 43 mg/dL (ref >39)
LDL CALC: 63 mg/dL (ref 0–99)
LDl/HDL Ratio: 1.5 ratio (ref 0.0–3.2)
Triglycerides: 98 mg/dL (ref 0–149)
VLDL CHOLESTEROL CAL: 20 mg/dL (ref 5–40)

## 2017-07-19 LAB — VITAMIN B12: VITAMIN B 12: 390 pg/mL (ref 232–1245)

## 2017-07-19 LAB — HEMOGLOBIN A1C
ESTIMATED AVERAGE GLUCOSE: 148 mg/dL
Hgb A1c MFr Bld: 6.8 % — ABNORMAL HIGH (ref 4.8–5.6)

## 2017-07-19 LAB — MICROALBUMIN / CREATININE URINE RATIO
Creatinine, Urine: 81.9 mg/dL
Microalb/Creat Ratio: 7.8 mg/g creat (ref 0.0–30.0)
Microalbumin, Urine: 6.4 ug/mL

## 2017-07-19 LAB — T4, FREE: Free T4: 1.06 ng/dL (ref 0.82–1.77)

## 2017-07-19 LAB — TSH: TSH: 3.74 u[IU]/mL (ref 0.450–4.500)

## 2017-07-19 LAB — VITAMIN D 25 HYDROXY (VIT D DEFICIENCY, FRACTURES): VIT D 25 HYDROXY: 30.5 ng/mL (ref 30.0–100.0)

## 2017-07-19 NOTE — Progress Notes (Signed)
.  Office: [phone]  /  Fax: [phone]   HPI:   Chief Complaint: OBESITY  [name] (MR# [id number]) is a 67 y.o. female who presents on 07/19/2017 for obesity evaluation and treatment. Current BMI is Body mass index is 50.07 kg/m.[name] Prabhnoor has struggled with obesity for years and has been unsuccessful in either losing weight or maintaining long term weight loss. [name] attended our information session and states she is currently in the action stage of change and ready to dedicate time achieving and maintaining a healthier weight.  [name] states her desired weight loss is 130 lbs she has been heavy most of  her life she started gaining weight in the last 5 yrs her heaviest weight ever was 270 lbs. she has significant food cravings issues  she snacks frequently in the evenings she skips meals frequently she frequently eats larger portions than normal  she has binge eating behaviors she struggles with emotional eating    Fatigue [name] feels her energy is lower than it should be. This has worsened with weight gain and has not worsened recently. [name] admits to daytime somnolence and admits to waking up still tired. Patient is at risk for obstructive sleep apnea. Patent has a history of symptoms of daytime fatigue and morning fatigue. Patient generally gets 6 to 8 hours of sleep per night, and states they generally have restful sleep. Snoring is present. Apneic episodes are not present. Epworth Sleepiness Score is 6  Dyspnea on exertion [name] notes increasing shortness of breath with exercising and seems to be worsening over time with weight gain. She notes getting out of breath sooner with activity than she used to. This has not gotten worse recently. [name] denies orthopnea.  Vitamin D deficiency Unique has a diagnosis of vitamin D deficiency. She is on OTC vit D 1,000 IU daily and admits fatigue. There are no recent labs. [name] denies nausea, vomiting or muscle  weakness.  Vitamin B12 deficiency [name] has a diagnosis of B12 insufficiency and notes fatigue. She is on metformin therapy. This is a new diagnosis. [name] is not a vegetarian and does have a previous diagnosis of anemia. She does not have a history of weight loss surgery. She is not on B12 supplement.  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 [name] states fasting BGs range between 81 and 189, 2 hour post prandial BGs range between 45 and 252. Her diabetes is uncontrolled with wide excursions  [name] is on metformin, trulicity and humalog. .She is attempting to work on intensive lifestyle modifications including diet, exercise, and weight loss to help control her blood glucose levels.  Hypothyroidism [name] has a diagnosis of hypothyroidism. She is on levothyroxine. [name] has a history of atrial fibrillation. There are no recent labs in Epic. She denies hot or cold intolerance or palpitations, but does admit to ongoing fatigue.  Depression Screen [name]'s Food and Mood (modified PHQ-9) score was  Depression screen PHQ 2/9 07/18/2017  Decreased Interest 1  Down, Depressed, Hopeless 2  PHQ - 2 Score 3  Altered sleeping 2  Tired, decreased energy 3  Change in appetite 2  Feeling bad or failure about yourself  2  Trouble concentrating 1  Moving slowly or fidgety/restless 1  Suicidal thoughts 0  PHQ-9 Score 14  Difficult doing work/chores Somewhat difficult    ALLERGIES: Allergies  Allergen Reactions  . Actos [Pioglitazone Hydrochloride] Swelling and Other (See Comments)    Numbness  in lips, HEADACHES SWELLING REACTION UNSPECIFIED   . Avandia [Rosiglitazone] Swelling    Numbness in lips, headaches  SWELLING REACTION UNSPECIFIED   . Tetanus Toxoids Other (See Comments)    Arm swelling, fainted, ?Respiratory distress  (horse serum)  . Food Nausea Only    Eggplant    MEDICATIONS: Current Outpatient Medications on  File Prior to Visit  Medication Sig Dispense Refill  . amLODipine (NORVASC) 5 MG tablet Take 5 mg by mouth every evening.     [name] amoxicillin (AMOXIL) 500 MG capsule as directed. PRIOR TO DENTAL PROCEDURES  0  . atorvastatin (LIPITOR) 40 MG tablet Take 40 mg by mouth every evening.     . cholecalciferol (VITAMIN D) 1000 units tablet Take 1,000 Units by mouth every evening.     . DULoxetine (CYMBALTA) 60 MG capsule Take 60 mg by mouth daily.    . flecainide (TAMBOCOR) 50 MG tablet TAKE 1 TABLET BY MOUTH TWICE A DAY 180 tablet 2  . hydrochlorothiazide (MICROZIDE) 12.5 MG capsule Take 1 capsule by mouth daily.  3  . indomethacin (INDOCIN) 50 MG capsule Take 50 mg by mouth 3 (three) times daily as needed (gout).    . insulin glargine (LANTUS) 100 UNIT/ML injection Inject 70 Units into the skin 2 (two) times daily.     . insulin lispro (HUMALOG) 100 UNIT/ML cartridge Inject 24 Units into the skin 3 (three) times daily with meals. UNLESS BLOOD SUGAR IS 100 OR LESS     . ketorolac (TORADOL) 10 MG tablet Take 10 mg by mouth 3 (three) times daily as needed (CHEST WALL PAIN/PLURACY).    [name] levothyroxine (SYNTHROID, LEVOTHROID) 125 MCG tablet Take 125 mcg by mouth daily before breakfast.     . Melatonin 5 MG CAPS Take 5 mg by mouth at bedtime as needed (sleep).     . metFORMIN (GLUCOPHAGE-XR) 500 MG 24 hr tablet Take 1,000 mg by mouth daily with supper.    . methocarbamol (ROBAXIN) 500 MG tablet TAKE 1 TABLET BY MOUTH EVERY 6 HOURS AS NEEDED FOR MUSCLE SPASMS 60 tablet 0  . metoprolol tartrate (LOPRESSOR) 50 MG tablet Take 50 mg by mouth 2 (two) times daily.    [name] omeprazole (PRILOSEC) 20 MG capsule Take 20 mg by mouth every evening.     . TRULICITY 1.5 MG/0.5ML SOPN as directed.  11  . valsartan (DIOVAN) 320 MG tablet Take 1 tablet by mouth daily.  3  . XARELTO 20 MG TABS tablet TAKE 1 TABLET BY MOUTH ONCE DAILY WITH SUPPER 30 tablet 11   No current facility-administered medications on file prior to visit.      PAST MEDICAL HISTORY: Past Medical History:  Diagnosis Date  . A-fib (HCC)   . Anemia    hx of  . Arthritis   . Chest pain   . Constipation   . Depression   . Diabetes mellitus ORAL MEDS  . Diabetic retinopathy (HCC)   . Dyspnea    with minimal exertion-deconditioned  . Dyspnea   . Dysrhythmia    a-fib  . Food allergy   . GERD (gastroesophageal reflux disease)    occasional  . Gout   . History of kidney stones   . Hypercholesteremia   . Hyperlipidemia   . Hypertension   . Hypertensive kidney disease   . Hypothyroidism   . Knee pain, right   . 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 Obesity   . OSA on CPAP  uses CPAP nightly  . Osteoarthritis   . Palpitations   . Pinched nerve in neck   . Pleurisy   . Pneumonia    hx of  . PONV (postoperative nausea and vomiting)    for 3-4 days after general anesthesia  . Sciatica   . Swelling of knee joint, right   . Synovitis of knee RIGHT  . Trigger finger, left    left index  . Vitamin D deficiency     PAST SURGICAL HISTORY: Past Surgical History:  Procedure Laterality Date  . CESAREAN SECTION  X3  . KNEE ARTHROSCOPY  04/20/2011   Procedure: ARTHROSCOPY KNEE;  Surgeon: Loanne Drilling, MD;  Location: [facility];  Service: Orthopedics;  Laterality: Right;  WITH SYNOVECTOMY  . LEFT CARPAL TUNNEL / LEFT MIDDLE & RING FINGER TRIGGER RELEASE  08-26-2008  . LEFT SHOULDER ARTHROSCOPY W/ DEBRIDEMENT  09-09-2003  . LEFT SHOULDER ARTHROSCOPY/ LEFT THUMB TRIGGER RELEASE  02-22-2005  . PULLEY RELEASE LEFT LONG FINGER  07-14-2009  . RIGHT CARPAL TUNNEL/ RIGHT THUMB TRIGGER RELEASE'S  11-28-2006  . RIGHT SHOULDER ARTHROSCOPY W/ ROTATOR CUFF REPAIR  01-13-2004  . SHOULDER ARTHROSCOPY DISTAL CLAVICLE EXCISION AND OPEN ROTATOR CUFF REPAIR  09-07-2004   LEFT  . SHOULDER ARTHROSCOPY W/ ACROMIAL REPAIR  11-29-2005   LEFT  . TONSILLECTOMY AND ADENOIDECTOMY  child  . TOTAL KNEE ARTHROPLASTY  12-14-2009    RIGHT  . TOTAL KNEE ARTHROPLASTY Left 11/05/2012   Procedure: LEFT TOTAL KNEE ARTHROPLASTY;  Surgeon: Loanne Drilling, MD;  Location: [facility];  Service: Orthopedics;  Laterality: Left;  . TOTAL KNEE REVISION Right 07/29/2016   Procedure: RIGHT KNEE POLY-LINER EXCHANGE;  Surgeon: Kathryne Hitch, MD;  Location: [facility];  Service: Orthopedics;  Laterality: Right;  . TRIGGER FINGER RELEASE Right 02/21/2013   Procedure: RIGHT RING A-1 PULLEY RELEASE    (MINOR PROCEDURE) ;  Surgeon: Wyn Forster., MD;  Location: [facility];  Service: Orthopedics;  Laterality: Right;  . TRIGGER FINGER RELEASE Left 09/22/2016   Procedure: RELEASE TRIGGER FINGER LEFT INDEX FINGER;  Surgeon: Kathryne Hitch, MD;  Location: [facility];  Service: Orthopedics;  Laterality: Left;  . TRIGGER FINGER RELEASE Right 01/20/2017   Procedure: RELEASE TRIGGER FINGER/A-1 PULLEY RIGHT INDEX FINGER;  Surgeon: Marcene Corning, MD;  Location: [facility];  Service: Orthopedics;  Laterality: Right;    SOCIAL HISTORY: Social History   Tobacco Use  . Smoking status: Former Smoker    Years: 5.00    Types: Cigarettes    Last attempt to quit: 04/12/1977    Years since quitting: 40.2  . Smokeless tobacco: Never Used  Substance Use Topics  . Alcohol use: No  . Drug use: No    FAMILY HISTORY: Family History  Problem Relation Age of Onset  . Diabetes Mother   . Hypertension Mother   . Heart attack Mother   . Thyroid disease Mother   . Obesity Mother   . Cancer Father   . Liver disease Father   . Sleep apnea Father   . Alcoholism Father     ROS: Review of Systems  Constitutional: Positive for malaise/fatigue.  Eyes:       Wear Glasses or Contacts Floaters  Respiratory: Positive for shortness of breath.   Cardiovascular: Positive for palpitations.       Positive for Shortness of Breath with Activity Chest Pain /Discomfort  Gastrointestinal: Positive for constipation and  heartburn. Negative for nausea and  vomiting.  Musculoskeletal: Positive for back pain.       Muscle or Joint Pain Negative for muscle weakness  Skin:       Dryness  Endo/Heme/Allergies: Bruises/bleeds easily (bruising).       Positive for hyperglycemia Positive for hypoglycemia Negative for Hot or Cold Intolerance  Psychiatric/Behavioral: Positive for depression. The patient has insomnia.     PHYSICAL EXAM: Blood pressure 121/66, pulse 63, temperature 98.1 F (36.7 C), temperature source Oral, height  (1.549 m), weight 265 lb (120.2 kg), SpO2 97 %. Body mass index is 50.07 kg/m. Physical Exam  Constitutional: She is oriented to person, place, and time. She appears well-developed and well-nourished.  HENT:  Head: Normocephalic and atraumatic.  Nose: Nose normal.  Eyes: EOM are normal. No scleral icterus.  Neck: Normal range of motion. Neck supple. No thyromegaly present.  Cardiovascular: Normal rate and regular rhythm. Exam reveals distant heart sounds.  Pulmonary/Chest: Effort normal. No respiratory distress.  Abdominal: Soft. There is no tenderness.  + obesity  Musculoskeletal: Normal range of motion. She exhibits edema (1+ edema bilateral lower extremeties).  Range of Motion normal in all 4 extremities  Neurological: She is alert and oriented to person, place, and time. Coordination normal.  Skin: Skin is warm and dry.  Psychiatric: She has a normal mood and affect. Her behavior is normal.  Vitals reviewed.   RECENT LABS AND TESTS: BMET    Component Value Date/Time   NA 140 07/18/2017 1112   K 4.3 07/18/2017 1112   CL 102 07/18/2017 1112   CO2 22 07/18/2017 1112   GLUCOSE 95 07/18/2017 1112   GLUCOSE 214 (H) 01/17/2017 1459   BUN 31 (H) 07/18/2017 1112   CREATININE 1.30 (H) 07/18/2017 1112   CALCIUM 9.9 07/18/2017 1112   GFRNONAA 42 (L) 07/18/2017 1112   GFRAA 49 (L) 07/18/2017 1112   Lab Results  Component Value Date   HGBA1C 6.8 (H) 07/18/2017   No  results found for: INSULIN CBC    Component Value Date/Time   WBC 10.0 07/18/2017 1112   WBC 8.6 09/22/2016 0733   RBC 4.57 07/18/2017 1112   RBC 4.31 09/22/2016 0733   HGB 13.0 07/18/2017 1112   HCT 39.6 07/18/2017 1112   PLT 284 09/22/2016 0733   MCV 87 07/18/2017 1112   MCH 28.4 07/18/2017 1112   MCH 28.5 09/22/2016 0733   MCHC 32.8 07/18/2017 1112   MCHC 31.4 09/22/2016 0733   RDW 15.3 07/18/2017 1112   LYMPHSABS 1.8 07/18/2017 1112   EOSABS 0.1 07/18/2017 1112   BASOSABS 0.0 07/18/2017 1112   Iron/TIBC/Ferritin/ %Sat No results found for: IRON, TIBC, FERRITIN, IRONPCTSAT Lipid Panel     Component Value Date/Time   CHOL 126 07/18/2017 1112   TRIG 98 07/18/2017 1112   HDL 43 07/18/2017 1112   LDLCALC 63 07/18/2017 1112   Hepatic Function Panel     Component Value Date/Time   PROT 7.7 07/18/2017 1112   ALBUMIN 4.3 07/18/2017 1112   AST 25 07/18/2017 1112   ALT 20 07/18/2017 1112   ALKPHOS 83 07/18/2017 1112   BILITOT 0.3 07/18/2017 1112      Component Value Date/Time   TSH 3.740 07/18/2017 1112   Vitamin D There are no recent labs  ECG  shows NSR with a rate of 62 BPM INDIRECT CALORIMETER done today shows a VO2 of 242 and a REE of 1686. Her calculated basal metabolic rate is 1610 thus her basal metabolic rate is worse  than expected.    ASSESSMENT AND PLAN: Other fatigue - Plan: EKG 12-Lead, CBC With Differential, Lipid Panel With LDL/HDL Ratio  Shortness of breath on exertion - Plan: CBC With Differential  Type 2 diabetes mellitus with retinopathy, with long-term current use of insulin, macular edema presence unspecified, unspecified laterality, unspecified retinopathy severity (HCC) - Plan: Comprehensive metabolic panel, Hemoglobin A1c, Microalbumin / creatinine urine ratio  Vitamin D deficiency - Plan: VITAMIN D 25 Hydroxy (Vit-D Deficiency, Fractures)  B12 nutritional deficiency - Plan: Vitamin B12  Other specified hypothyroidism - Plan: T3, T4,  free, TSH  Depression screening  Class 3 severe obesity with serious comorbidity and body mass index (BMI) of 50.0 to 59.9 in adult, unspecified obesity type (HCC)  PLAN:  Fatigue Brena was informed that her fatigue may be related to obesity, depression or many other causes. Labs will be ordered, and in the meanwhile Bibi has agreed to work on diet, exercise and weight loss to help with fatigue. Proper sleep hygiene was discussed including the need for 7-8 hours of quality sleep each night. A sleep study was not ordered based on symptoms and Epworth score.  Dyspnea on exertion Rowan's shortness of breath appears to be obesity related and exercise induced. She has agreed to work on weight loss and gradually increase exercise to treat her exercise induced shortness of breath. If Tekeya follows our instructions and loses weight without improvement of her shortness of breath, we will plan to refer to pulmonology. We will monitor this condition regularly. Shere agrees to this plan.  Vitamin D Deficiency Rachell was informed that low vitamin D levels contributes to fatigue and are associated with obesity, breast, and colon cancer. She will continue OTC Vit D 1,000 IU daily. We will check labs and she will follow up for routine testing of vitamin D, at least 2-3 times per year. She was informed of the risk of over-replacement of vitamin D and agrees to not increase her dose unless she discusses this with [name] first. Iqra agrees to follow up with our clinic in 2 weeks.  Vitamin B12 deficiency Emilija will work on increasing B12 rich foods in her diet. B12 supplementation was not prescribed today. We will check labs and follow.  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 We discussed the  importance of good blood sugar control to decrease the likelihood of diabetic complications such as nephropathy, neuropathy, limb loss, blindness, coronary artery disease, and death. We discussed the importance of intensive lifestyle modification including diet, exercise and weight loss as the first line treatment for diabetes. We will check labs and will start diet prescription. Sherrell agrees to not take humalog if pre-prandial glucose is below 100, if glucose is between 100 and 120, take 10 units if glucose  Is between 120 and 150, take 20 units. There are no other medication changes at this time. Shanley agrees to follow up at the agreed upon time.  Hypothyroidism Davona was informed of the importance of good thyroid control to help with weight loss efforts. She was also informed that supertheraputic thyroid levels are dangerous and will not improve weight loss results. We will check labs and Avangelina agrees to follow up with our clinic in 2 weeks.  Depression Screen Dhyana had a moderately positive depression screening. Depression is commonly associated with obesity and often results in  emotional eating behaviors. We will monitor this closely and work on CBT to help improve the non-hunger eating patterns. Referral to Psychology may be required if no improvement is seen as she continues in our clinic.  Obesity Ranata is currently in the action stage of change and her goal is to continue with weight loss efforts She has agreed to follow the Category 2 plan with half cup of cottage cheese Elese has been instructed to work up to a goal of 150 minutes of combined cardio and strengthening exercise per week for weight loss and overall health benefits. We discussed the following Behavioral Modification Strategies today: increasing lean protein intake, decreasing simple carbohydrates  and work on meal planning and easy cooking plans  Teniya has agreed to follow up with our clinic in 2 weeks. She was informed  of the importance of frequent follow up visits to maximize her success with intensive lifestyle modifications for her multiple health conditions. She was informed we would discuss her lab results at her next visit unless there is a critical issue that needs to be addressed sooner. Jeraldean agreed to keep her next visit at the agreed upon time to discuss these results.    OBESITY BEHAVIORAL INTERVENTION VISIT  Today's visit was # 1 out of 22.  Starting weight: 265 lbs Starting date: 07/18/17 Today's weight : 265 lbs Today's date: 07/18/2017 Total lbs lost to date: 0 (Patients must lose 7 lbs in the first 6 months to continue with counseling)   ASK: We discussed the diagnosis of obesity with Alinda Deem today and Shritha agreed to give [name] permission to discuss obesity behavioral modification therapy today.  ASSESS: Evangelyne has the diagnosis of obesity and her BMI today is 50.1 Shamona is in the action stage of change   ADVISE: Meghna was educated on the multiple health risks of obesity as well as the benefit of weight loss to improve her health. She was advised of the need for long term treatment and the importance of lifestyle modifications.  AGREE: Multiple dietary modification options and treatment options were discussed and  Sarabeth agreed to the above obesity treatment plan.   I, [place] Crane, am acting as transcriptionist for Quillian Quince, MD  I have reviewed the above documentation for accuracy and completeness, and I agree with the above. -Quillian Quince, MD

## 2017-07-20 ENCOUNTER — Telehealth (INDEPENDENT_AMBULATORY_CARE_PROVIDER_SITE_OTHER): Payer: Self-pay | Admitting: Orthopaedic Surgery

## 2017-07-20 MED ORDER — HYDROCODONE-ACETAMINOPHEN 5-325 MG PO TABS: 1.0000 | ORAL_TABLET | Freq: Four times a day (QID) | ORAL | 0 refills | Status: DC | PRN

## 2017-07-20 MED FILL — METFORMIN HCL ER 500 MG TAB: 500 | 90 days supply | Qty: 180 | Fill #3

## 2017-07-20 MED FILL — HYDROCODON-APAP 5-325: 5-325 | 7 days supply | Qty: 30 | Fill #0

## 2017-07-20 NOTE — Telephone Encounter (Signed)
Patient requesting RX refill on Hydrocodone.   Patients # [id number]

## 2017-07-20 NOTE — Telephone Encounter (Signed)
I escribed it in to her pharmacy.

## 2017-07-20 NOTE — Telephone Encounter (Signed)
See message below °

## 2017-07-21 MED FILL — VALSARTAN 320 MG TAB: 320 | 30 days supply | Qty: 30 | Fill #6

## 2017-07-21 MED FILL — LEVOTHYROXINE 125 MCG TABLE: 125 | 30 days supply | Qty: 30 | Fill #4

## 2017-08-01 ENCOUNTER — Ambulatory Visit (INDEPENDENT_AMBULATORY_CARE_PROVIDER_SITE_OTHER): Payer: 59 | Admitting: Family Medicine

## 2017-08-01 VITALS — BP 127/67 | HR 69 | Temp 98.0°F | Ht 61.0 in | Wt 262.0 lb

## 2017-08-01 DIAGNOSIS — E119 Type 2 diabetes mellitus without complications: Secondary | ICD-10-CM | POA: Diagnosis not present

## 2017-08-01 DIAGNOSIS — E559 Vitamin D deficiency, unspecified: Secondary | ICD-10-CM

## 2017-08-01 DIAGNOSIS — Z6841 Body Mass Index (BMI) 40.0 and over, adult: Secondary | ICD-10-CM | POA: Diagnosis not present

## 2017-08-01 DIAGNOSIS — Z794 Long term (current) use of insulin: Secondary | ICD-10-CM

## 2017-08-01 MED ORDER — VITAMIN D (ERGOCALCIFEROL) 1.25 MG (50000 UNIT) PO CAPS: 50000.0000 [IU] | ORAL_CAPSULE | ORAL | 0 refills | Status: DC

## 2017-08-01 MED FILL — VIT D2 1.25 MG (50,000 UNIT: 1.25 MG | 28 days supply | Qty: 4 | Fill #0

## 2017-08-02 ENCOUNTER — Other Ambulatory Visit: Payer: Self-pay | Admitting: Cardiovascular Disease

## 2017-08-02 MED ORDER — METOPROLOL TARTRATE 50 MG PO TABS: 50.0000 mg | ORAL_TABLET | Freq: Two times a day (BID) | ORAL | 2 refills | Status: DC

## 2017-08-02 MED FILL — METOPROLOL TARTRATE 50 MG T: 50 | 90 days supply | Qty: 180 | Fill #0

## 2017-08-02 MED FILL — OMEPRAZOLE 20 MG CAP: 20 | 90 days supply | Qty: 90 | Fill #0

## 2017-08-02 MED FILL — TRULICITY 1.5 MG/0.5 ML PEN: 1.5 | 28 days supply | Qty: 2 | Fill #6

## 2017-08-02 MED FILL — HYDROCHLOROTHIAZIDE 12.5 MG: 12.5 | 90 days supply | Qty: 90 | Fill #2

## 2017-08-02 MED FILL — DULoxetine HCL 60 MG CPEP: 60 | 30 days supply | Qty: 30 | Fill #1

## 2017-08-02 NOTE — Progress Notes (Signed)
Office: [phone]  /  Fax: [phone]   HPI:   Chief Complaint: OBESITY [name] is here to discuss her progress with her obesity treatment plan. She is on the Category 2 plan and is following her eating plan approximately 98 % of the time. She states she is walking a lot every day. [name] did very well with weight loss and the category 2 plan, but she didn't like her bread options. Hunger was mostly controlled and she did well eating all her vegetables. Her weight is 262 lb (118.8 kg) today and has had a weight loss of 3 pounds over a period of 2 weeks since her last visit. She has lost 3 lbs since starting treatment with [name].  Vitamin D deficiency [name] has a diagnosis of vitamin D deficiency. She is not currently taking vit D and level is not yet at goal. Lindora denies nausea, vomiting or muscle weakness.  Diabetes II [name] has a diagnosis of diabetes type II. [name] states fasting BGs dropped as low as 60 and no readings over 150. Patient has now stopped all mealtime insulin. [name] is doing well on diet prescription. She has been working on intensive lifestyle modifications including diet, exercise, and weight loss to help control her blood glucose levels.  ALLERGIES: Allergies  Allergen Reactions  . Actos [Pioglitazone Hydrochloride] Swelling and Other (See Comments)    Numbness in lips, HEADACHES SWELLING REACTION UNSPECIFIED   . Avandia [Rosiglitazone] Swelling    Numbness in lips, headaches  SWELLING REACTION UNSPECIFIED   . Tetanus Toxoids Other (See Comments)    Arm swelling, fainted, ?Respiratory distress  (horse serum)  . Food Nausea Only    Eggplant    MEDICATIONS: Current Outpatient Medications on File Prior to Visit  Medication Sig Dispense Refill  . amLODipine (NORVASC) 5 MG tablet Take 5 mg by mouth every evening.     [name] amoxicillin (AMOXIL) 500 MG capsule as directed. PRIOR TO DENTAL PROCEDURES  0  . atorvastatin (LIPITOR) 40 MG tablet Take 40 mg by mouth  every evening.     . cholecalciferol (VITAMIN D) 1000 units tablet Take 1,000 Units by mouth every evening.     . DULoxetine (CYMBALTA) 60 MG capsule Take 60 mg by mouth daily.    . flecainide (TAMBOCOR) 50 MG tablet TAKE 1 TABLET BY MOUTH TWICE A DAY 180 tablet 2  . hydrochlorothiazide (MICROZIDE) 12.5 MG capsule Take 1 capsule by mouth daily.  3  . HYDROcodone-acetaminophen (NORCO/VICODIN) 5-325 MG tablet Take 1 tablet by mouth every 6 (six) hours as needed for moderate pain. 30 tablet 0  . indomethacin (INDOCIN) 50 MG capsule Take 50 mg by mouth 3 (three) times daily as needed (gout).    . insulin glargine (LANTUS) 100 UNIT/ML injection Inject 70 Units into the skin 2 (two) times daily.     . insulin lispro (HUMALOG) 100 UNIT/ML cartridge Inject 24 Units into the skin 3 (three) times daily with meals. UNLESS BLOOD SUGAR IS 100 OR LESS     . ketorolac (TORADOL) 10 MG tablet Take 10 mg by mouth 3 (three) times daily as needed (CHEST WALL PAIN/PLURACY).    [name] levothyroxine (SYNTHROID, LEVOTHROID) 125 MCG tablet Take 125 mcg by mouth daily before breakfast.     . Melatonin 5 MG CAPS Take 5 mg by mouth at bedtime as needed (sleep).     . metFORMIN (GLUCOPHAGE-XR) 500 MG 24 hr tablet Take 1,000 mg by mouth daily with supper.    . methocarbamol (  ROBAXIN) 500 MG tablet TAKE 1 TABLET BY MOUTH EVERY 6 HOURS AS NEEDED FOR MUSCLE SPASMS 60 tablet 0  . omeprazole (PRILOSEC) 20 MG capsule Take 20 mg by mouth every evening.     . TRULICITY 1.5 MG/0.5ML SOPN as directed.  11  . valsartan (DIOVAN) 320 MG tablet Take 1 tablet by mouth daily.  3  . XARELTO 20 MG TABS tablet TAKE 1 TABLET BY MOUTH ONCE DAILY WITH SUPPER 30 tablet 11   No current facility-administered medications on file prior to visit.     PAST MEDICAL HISTORY: Past Medical History:  Diagnosis Date  . A-fib (HCC)   . Anemia    hx of  . Arthritis   . Chest pain   . Constipation   . Depression   . Diabetes mellitus ORAL MEDS  . Diabetic  retinopathy (HCC)   . Dyspnea    with minimal exertion-deconditioned  . Dyspnea   . Dysrhythmia    a-fib  . Food allergy   . GERD (gastroesophageal reflux disease)    occasional  . Gout   . History of kidney stones   . Hypercholesteremia   . Hyperlipidemia   . Hypertension   . Hypertensive kidney disease   . Hypothyroidism   . Knee pain, right   . 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 Obesity   . OSA on CPAP    uses CPAP nightly  . Osteoarthritis   . Palpitations   . Pinched nerve in neck   . Pleurisy   . Pneumonia    hx of  . PONV (postoperative nausea and vomiting)    for 3-4 days after general anesthesia  . Sciatica   . Swelling of knee joint, right   . Synovitis of knee RIGHT  . Trigger finger, left    left index  . Vitamin D deficiency     PAST SURGICAL HISTORY: Past Surgical History:  Procedure Laterality Date  . CESAREAN SECTION  X3  . KNEE ARTHROSCOPY  04/20/2011   Procedure: ARTHROSCOPY KNEE;  Surgeon: Loanne Drilling, MD;  Location: [facility];  Service: Orthopedics;  Laterality: Right;  WITH SYNOVECTOMY  . LEFT CARPAL TUNNEL / LEFT MIDDLE & RING FINGER TRIGGER RELEASE  08-26-2008  . LEFT SHOULDER ARTHROSCOPY W/ DEBRIDEMENT  09-09-2003  . LEFT SHOULDER ARTHROSCOPY/ LEFT THUMB TRIGGER RELEASE  02-22-2005  . PULLEY RELEASE LEFT LONG FINGER  07-14-2009  . RIGHT CARPAL TUNNEL/ RIGHT THUMB TRIGGER RELEASE'S  11-28-2006  . RIGHT SHOULDER ARTHROSCOPY W/ ROTATOR CUFF REPAIR  01-13-2004  . SHOULDER ARTHROSCOPY DISTAL CLAVICLE EXCISION AND OPEN ROTATOR CUFF REPAIR  09-07-2004   LEFT  . SHOULDER ARTHROSCOPY W/ ACROMIAL REPAIR  11-29-2005   LEFT  . TONSILLECTOMY AND ADENOIDECTOMY  child  . TOTAL KNEE ARTHROPLASTY  12-14-2009   RIGHT  . TOTAL KNEE ARTHROPLASTY Left 11/05/2012   Procedure: LEFT TOTAL KNEE ARTHROPLASTY;  Surgeon: Loanne Drilling, MD;  Location: [facility];  Service: Orthopedics;  Laterality: Left;  . TOTAL KNEE REVISION Right  07/29/2016   Procedure: RIGHT KNEE POLY-LINER EXCHANGE;  Surgeon: Kathryne Hitch, MD;  Location: [facility];  Service: Orthopedics;  Laterality: Right;  . TRIGGER FINGER RELEASE Right 02/21/2013   Procedure: RIGHT RING A-1 PULLEY RELEASE    (MINOR PROCEDURE) ;  Surgeon: Wyn Forster., MD;  Location: [facility];  Service: Orthopedics;  Laterality: Right;  . TRIGGER FINGER RELEASE Left 09/22/2016   Procedure: RELEASE TRIGGER  FINGER LEFT INDEX FINGER;  Surgeon: Kathryne Hitch, MD;  Location: [facility];  Service: Orthopedics;  Laterality: Left;  . TRIGGER FINGER RELEASE Right 01/20/2017   Procedure: RELEASE TRIGGER FINGER/A-1 PULLEY RIGHT INDEX FINGER;  Surgeon: Marcene Corning, MD;  Location:  SUR[facility]ERY C[facility];  Service: Orthopedics;  Laterality: Right;    SOCIAL HISTORY: Social History   Tobacco Use  . Smoking status: Former Smoker    Years: 5.00    Types: Cigarettes    Last attempt to quit: 04/12/1977    Years since quitting: 40.3  . Smokeless tobacco: Never Used  Substance Use Topics  . Alcohol use: No  . Drug use: No    FAMILY HISTORY: Family History  Problem Relation Age of Onset  . Diabetes Mother   . Hypertension Mother   . Heart attack Mother   . Thyroid disease Mother   . Obesity Mother   . Cancer Father   . Liver disease Father   . Sleep apnea Father   . Alcoholism Father     ROS: Review of Systems  Constitutional: Positive for weight loss.  Gastrointestinal: Negative for nausea and vomiting.  Musculoskeletal:       Negative for muscle weakness  Endo/Heme/Allergies:       Positive for hypoglycemia    PHYSICAL EXAM: Blood pressure 127/67, pulse 69, temperature 98 F (36.7 C), temperature source Oral, height  (1.549 m), weight 262 lb (118.8 kg), SpO2 95 %. Body mass index is 49.5 kg/m. Physical Exam  Constitutional: She is oriented to person, place, and time. She appears well-developed and well-nourished.    Cardiovascular: Normal rate.  Pulmonary/Chest: Effort normal.  Musculoskeletal: Normal range of motion.  Neurological: She is oriented to person, place, and time.  Skin: Skin is warm and dry.  Psychiatric: She has a normal mood and affect. Her behavior is normal.  Vitals reviewed.   RECENT LABS AND TESTS: BMET    Component Value Date/Time   NA 140 07/18/2017 1112   K 4.3 07/18/2017 1112   CL 102 07/18/2017 1112   CO2 22 07/18/2017 1112   GLUCOSE 95 07/18/2017 1112   GLUCOSE 214 (H) 01/17/2017 1459   BUN 31 (H) 07/18/2017 1112   CREATININE 1.30 (H) 07/18/2017 1112   CALCIUM 9.9 07/18/2017 1112   GFRNONAA 42 (L) 07/18/2017 1112   GFRAA 49 (L) 07/18/2017 1112   Lab Results  Component Value Date   HGBA1C 6.8 (H) 07/18/2017   HGBA1C 7.7 (H) 07/20/2016   HGBA1C 7.9 09/18/2014   No results found for: INSULIN CBC    Component Value Date/Time   WBC 10.0 07/18/2017 1112   WBC 8.6 09/22/2016 0733   RBC 4.57 07/18/2017 1112   RBC 4.31 09/22/2016 0733   HGB 13.0 07/18/2017 1112   HCT 39.6 07/18/2017 1112   PLT 284 09/22/2016 0733   MCV 87 07/18/2017 1112   MCH 28.4 07/18/2017 1112   MCH 28.5 09/22/2016 0733   MCHC 32.8 07/18/2017 1112   MCHC 31.4 09/22/2016 0733   RDW 15.3 07/18/2017 1112   LYMPHSABS 1.8 07/18/2017 1112   EOSABS 0.1 07/18/2017 1112   BASOSABS 0.0 07/18/2017 1112   Iron/TIBC/Ferritin/ %Sat No results found for: IRON, TIBC, FERRITIN, IRONPCTSAT Lipid Panel     Component Value Date/Time   CHOL 126 07/18/2017 1112   TRIG 98 07/18/2017 1112   HDL 43 07/18/2017 1112   LDLCALC 63 07/18/2017 1112   Hepatic Function Panel     Component Value Date/Time  PROT 7.7 07/18/2017 1112   ALBUMIN 4.3 07/18/2017 1112   AST 25 07/18/2017 1112   ALT 20 07/18/2017 1112   ALKPHOS 83 07/18/2017 1112   BILITOT 0.3 07/18/2017 1112      Component Value Date/Time   TSH 3.740 07/18/2017 1112   Results for VIOLANDA, BOBECK (MRN [id number]) as of 08/02/2017 16:33  Ref.  Range 07/18/2017 11:12  Vitamin D, 25-Hydroxy Latest Ref Range: 30.0 - 100.0 ng/mL 30.5   ASSESSMENT AND PLAN: Vitamin D deficiency - Plan: Vitamin D, Ergocalciferol, (DRISDOL) 50000 units CAPS capsule  Type 2 diabetes mellitus without complication, with long-term current use of insulin (HCC)  Class 3 severe obesity with serious comorbidity and body mass index (BMI) of 45.0 to 49.9 in adult, unspecified obesity type (HCC)  PLAN:  Vitamin D Deficiency Jonita was informed that low vitamin D levels contributes to fatigue and are associated with obesity, breast, and colon cancer. She agrees to start prescription Vit D @50 ,000 IU every week #4 with no refills and will follow up for routine testing of vitamin D, at least 2-3 times per year. She was informed of the risk of over-replacement of vitamin D and agrees to not increase her dose unless she discusses this with [name] first. Ebonique agrees to follow up as directed.  Diabetes II Chanin has been given extensive diabetes education by myself today including ideal fasting and post-prandial blood glucose readings, individual ideal Hgb A1c goals and hypoglycemia prevention. We discussed the importance of good blood sugar control to decrease the likelihood of diabetic complications such as nephropathy, neuropathy, limb loss, blindness, coronary artery disease, and death. We discussed the importance of intensive lifestyle modification including diet, exercise and weight loss as the first line treatment for diabetes. Terrea agrees to continue with diet and exercise and check blood sugar 2 times daily. Iva agrees to follow up in 2 to 3 weeks.  Obesity Shaleigh is currently in the action stage of change. As such, her goal is to continue with weight loss efforts She has agreed to follow the Category 2 plan and change to low calorie wraps for the bread. Delsa has been instructed to work up to a goal of 150 minutes of combined cardio and strengthening exercise per  week for weight loss and overall health benefits. We discussed the following Behavioral Modification Strategies today: increasing lean protein intake, decreasing simple carbohydrates  and work on meal planning and easy cooking plans  Bernardina has agreed to follow up with our clinic in 2 to 3 weeks. She was informed of the importance of frequent follow up visits to maximize her success with intensive lifestyle modifications for her multiple health conditions.   OBESITY BEHAVIORAL INTERVENTION VISIT  Today's visit was # 2 out of 22.  Starting weight: 265 lbs Starting date: 07/18/17 Today's weight : 262 lbs Today's date: 08/01/2017 Total lbs lost to date: 3 (Patients must lose 7 lbs in the first 6 months to continue with counseling)   ASK: We discussed the diagnosis of obesity with Alinda Deem today and Hibba agreed to give [name] permission to discuss obesity behavioral modification therapy today.  ASSESS: Leona has the diagnosis of obesity and her BMI today is 49.53 Zamira is in the action stage of change   ADVISE: Jamillah was educated on the multiple health risks of obesity as well as the benefit of weight loss to improve her health. She was advised of the need for long term treatment and the importance of lifestyle  modifications.  AGREE: Multiple dietary modification options and treatment options were discussed and  Doralene agreed to the above obesity treatment plan.  I, [place] Crane, am acting as transcriptionist for Quillian Quince, MD  I have reviewed the above documentation for accuracy and completeness, and I agree with the above. -Quillian Quince, MD

## 2017-08-08 MED FILL — XARELTO 20 MG TABLET: 20 | 30 days supply | Qty: 30 | Fill #10

## 2017-08-22 ENCOUNTER — Ambulatory Visit (INDEPENDENT_AMBULATORY_CARE_PROVIDER_SITE_OTHER): Payer: 59 | Admitting: Family Medicine

## 2017-08-25 MED FILL — AMOXICILLIN 500 MG CAPSULE: 500 | 5 days supply | Qty: 20 | Fill #0

## 2017-08-29 MED FILL — TRULICITY 1.5 MG/0.5 ML PEN: 1.5 | 28 days supply | Qty: 2 | Fill #7

## 2017-08-31 ENCOUNTER — Ambulatory Visit (INDEPENDENT_AMBULATORY_CARE_PROVIDER_SITE_OTHER): Payer: 59 | Admitting: Physician Assistant

## 2017-08-31 ENCOUNTER — Telehealth (INDEPENDENT_AMBULATORY_CARE_PROVIDER_SITE_OTHER): Payer: Self-pay | Admitting: Physician Assistant

## 2017-08-31 VITALS — BP 144/64 | HR 67 | Temp 98.1°F | Ht 61.0 in | Wt 261.0 lb

## 2017-08-31 DIAGNOSIS — Z8639 Personal history of other endocrine, nutritional and metabolic disease: Secondary | ICD-10-CM | POA: Diagnosis not present

## 2017-08-31 DIAGNOSIS — I10 Essential (primary) hypertension: Secondary | ICD-10-CM

## 2017-08-31 DIAGNOSIS — E039 Hypothyroidism, unspecified: Secondary | ICD-10-CM | POA: Diagnosis not present

## 2017-08-31 DIAGNOSIS — Z6841 Body Mass Index (BMI) 40.0 and over, adult: Secondary | ICD-10-CM | POA: Diagnosis not present

## 2017-08-31 DIAGNOSIS — Z9189 Other specified personal risk factors, not elsewhere classified: Secondary | ICD-10-CM | POA: Diagnosis not present

## 2017-08-31 DIAGNOSIS — Z5181 Encounter for therapeutic drug level monitoring: Secondary | ICD-10-CM | POA: Diagnosis not present

## 2017-08-31 DIAGNOSIS — E11649 Type 2 diabetes mellitus with hypoglycemia without coma: Secondary | ICD-10-CM | POA: Diagnosis not present

## 2017-08-31 DIAGNOSIS — Z794 Long term (current) use of insulin: Secondary | ICD-10-CM | POA: Diagnosis not present

## 2017-08-31 DIAGNOSIS — E11319 Type 2 diabetes mellitus with unspecified diabetic retinopathy without macular edema: Secondary | ICD-10-CM | POA: Diagnosis not present

## 2017-08-31 MED FILL — LEVOTHYROXINE 125 MCG TABLE: 125 | 30 days supply | Qty: 30 | Fill #5

## 2017-08-31 MED FILL — VALSARTAN 320 MG TAB: 320 | 30 days supply | Qty: 30 | Fill #7

## 2017-08-31 NOTE — Progress Notes (Signed)
Office: [phone]  /  Fax: [phone]   HPI:   Chief Complaint: OBESITY [name] is here to discuss her progress with her obesity treatment plan. She is on the Category 2 plan and is following her eating plan approximately 95 % of the time. She states she is walking for 15 minutes 5 times per week. [name] continues to do well with with weight loss. She follows the structured meal plan and states her hunger is well controlled. Her weight is 261 lb (118.4 kg) today and has had a weight loss of 1 pound over a period of 4 weeks since her last visit. She has lost 4 lbs since starting treatment with [name].  Diabetes II [name] has a diagnosis of diabetes type II. [name] states fasting BGs range between 50 and 160 and post prandial range between 60 and 160's. She notes hypoglycemia at 42 at night that woke her up from sleep, and took sugar tablet. She is not taking Humalog, stopped last visit. She is on Lantus 70 units BID and she has a visit with Endocrinologist today. Last A1c was 6.8 on 07/18/17. She has been working on intensive lifestyle modifications including diet, exercise, and weight loss to help control her blood glucose levels.  Hypertension [name] is a 67 y.o. female with hypertension. [name]'s blood pressure is elevated. She states she was running late for her appointment today. She declines any medications. She denies chest pain or shortness of breath. She is working weight loss to help control her blood pressure with the goal of decreasing her risk of heart attack and stroke. [name]'s blood pressure is not currently controlled.  At risk for cardiovascular disease [name] is at a higher than average risk for cardiovascular disease due to obesity, diabetes II, and hypertension. She currently denies any chest pain.  ALLERGIES: Allergies  Allergen Reactions  . Actos [Pioglitazone Hydrochloride] Swelling and Other (See Comments)    Numbness in lips, HEADACHES SWELLING REACTION  UNSPECIFIED   . Avandia [Rosiglitazone] Swelling    Numbness in lips, headaches  SWELLING REACTION UNSPECIFIED   . Tetanus Toxoids Other (See Comments)    Arm swelling, fainted, ?Respiratory distress  (horse serum)  . Food Nausea Only    Eggplant    MEDICATIONS: Current Outpatient Medications on File Prior to Visit  Medication Sig Dispense Refill  . amLODipine (NORVASC) 5 MG tablet Take 5 mg by mouth every evening.     [name] amoxicillin (AMOXIL) 500 MG capsule as directed. PRIOR TO DENTAL PROCEDURES  0  . atorvastatin (LIPITOR) 40 MG tablet Take 40 mg by mouth every evening.     . cholecalciferol (VITAMIN D) 1000 units tablet Take 1,000 Units by mouth every evening.     . DULoxetine (CYMBALTA) 60 MG capsule Take 60 mg by mouth daily.    . flecainide (TAMBOCOR) 50 MG tablet TAKE 1 TABLET BY MOUTH TWICE A DAY 180 tablet 2  . hydrochlorothiazide (MICROZIDE) 12.5 MG capsule Take 1 capsule by mouth daily.  3  . HYDROcodone-acetaminophen (NORCO/VICODIN) 5-325 MG tablet Take 1 tablet by mouth every 6 (six) hours as needed for moderate pain. 30 tablet 0  . indomethacin (INDOCIN) 50 MG capsule Take 50 mg by mouth 3 (three) times daily as needed (gout).    . insulin glargine (LANTUS) 100 UNIT/ML injection Inject 40 Units into the skin 2 (two) times daily.     . insulin lispro (HUMALOG) 100 UNIT/ML cartridge Inject 24 Units into the skin 3 (three) times daily  with meals. UNLESS BLOOD SUGAR IS 100 OR LESS     . ketorolac (TORADOL) 10 MG tablet Take 10 mg by mouth 3 (three) times daily as needed (CHEST WALL PAIN/PLURACY).    [name]. levothyroxine (SYNTHROID, LEVOTHROID) 125 MCG tablet Take 125 mcg by mouth daily before breakfast.     . Melatonin 5 MG CAPS Take 5 mg by mouth at bedtime as needed (sleep).     . metFORMIN (GLUCOPHAGE-XR) 500 MG 24 hr tablet Take 1,000 mg by mouth daily with supper.    . methocarbamol (ROBAXIN) 500 MG tablet TAKE 1 TABLET BY MOUTH EVERY 6 HOURS AS NEEDED FOR MUSCLE SPASMS 60 tablet  0  . metoprolol tartrate (LOPRESSOR) 50 MG tablet Take 1 tablet (50 mg total) by mouth 2 (two) times daily. 180 tablet 2  . omeprazole (PRILOSEC) 20 MG capsule Take 20 mg by mouth every evening.     . TRULICITY 1.5 MG/0.5ML SOPN as directed.  11  . valsartan (DIOVAN) 320 MG tablet Take 1 tablet by mouth daily.  3  . Vitamin D, Ergocalciferol, (DRISDOL) 50000 units CAPS capsule Take 1 capsule (50,000 Units total) by mouth every 7 (seven) days. 4 capsule 0  . XARELTO 20 MG TABS tablet TAKE 1 TABLET BY MOUTH ONCE DAILY WITH SUPPER 30 tablet 11   No current facility-administered medications on file prior to visit.     PAST MEDICAL HISTORY: Past Medical History:  Diagnosis Date  . A-fib (HCC)   . Anemia    hx of  . Arthritis   . Chest pain   . Constipation   . Depression   . Diabetes mellitus ORAL MEDS  . Diabetic retinopathy (HCC)   . Dyspnea    with minimal exertion-deconditioned  . Dyspnea   . Dysrhythmia    a-fib  . Food allergy   . GERD (gastroesophageal reflux disease)    occasional  . Gout   . History of kidney stones   . Hypercholesteremia   . Hyperlipidemia   . Hypertension   . Hypertensive kidney disease   . Hypothyroidism   . Knee pain, right   . 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 Obesity   . OSA on CPAP    uses CPAP nightly  . Osteoarthritis   . Palpitations   . Pinched nerve in neck   . Pleurisy   . Pneumonia    hx of  . PONV (postoperative nausea and vomiting)    for 3-4 days after general anesthesia  . Sciatica   . Swelling of knee joint, right   . Synovitis of knee RIGHT  . Trigger finger, left    left index  . Vitamin D deficiency     PAST SURGICAL HISTORY: Past Surgical History:  Procedure Laterality Date  . CESAREAN SECTION  X3  . KNEE ARTHROSCOPY  04/20/2011   Procedure: ARTHROSCOPY KNEE;  Surgeon: Loanne DrillingFrank V Aluisio, MD;  [facility];  Service: Orthopedics;  Laterality: Right;  WITH SYNOVECTOMY  .  LEFT CARPAL TUNNEL / LEFT MIDDLE & RING FINGER TRIGGER RELEASE  08-26-2008  . LEFT SHOULDER ARTHROSCOPY W/ DEBRIDEMENT  09-09-2003  . LEFT SHOULDER ARTHROSCOPY/ LEFT THUMB TRIGGER RELEASE  02-22-2005  . PULLEY RELEASE LEFT LONG FINGER  07-14-2009  . RIGHT CARPAL TUNNEL/ RIGHT THUMB TRIGGER RELEASE'S  11-28-2006  . RIGHT SHOULDER ARTHROSCOPY W/ ROTATOR CUFF REPAIR  01-13-2004  . SHOULDER ARTHROSCOPY DISTAL CLAVICLE EXCISION AND OPEN ROTATOR CUFF REPAIR  09-07-2004  LEFT  . SHOULDER ARTHROSCOPY W/ ACROMIAL REPAIR  11-29-2005   LEFT  . TONSILLECTOMY AND ADENOIDECTOMY  child  . TOTAL KNEE ARTHROPLASTY  12-14-2009   RIGHT  . TOTAL KNEE ARTHROPLASTY Left 11/05/2012   Procedure: LEFT TOTAL KNEE ARTHROPLASTY;  Surgeon: Loanne Drilling, MD;  Location: [facility];  Service: Orthopedics;  Laterality: Left;  . TOTAL KNEE REVISION Right 07/29/2016   Procedure: RIGHT KNEE POLY-LINER EXCHANGE;  Surgeon: Kathryne Hitch, MD;  Location: [facility];  Service: Orthopedics;  Laterality: Right;  . TRIGGER FINGER RELEASE Right 02/21/2013   Procedure: RIGHT RING A-1 PULLEY RELEASE    (MINOR PROCEDURE) ;  Surgeon: Wyn Forster., MD;  Location[facility];  Service: Orthopedics;  Laterality: Right;  . TRIGGER FINGER RELEASE Left 09/22/2016   Procedure: RELEASE TRIGGER FINGER LEFT INDEX FINGER;  Surgeon: Kathryne Hitch, MD;  Location: [facility];  Service: Orthopedics;  Laterality: Left;  . TRIGGER FINGER RELEASE Right 01/20/2017   Procedure: RELEASE TRIGGER FINGER/A-1 PULLEY RIGHT INDEX FINGER;  Surgeon: Marcene Corning, MD;  Location: [facility];  Service: Orthopedics;  Laterality: Right;    SOCIAL HISTORY: Social History   Tobacco Use  . Smoking status: Former Smoker    Years: 5.00    Types: Cigarettes    Last attempt to quit: 04/12/1977    Years since quitting: 40.4  . Smokeless tobacco: Never Used  Substance Use Topics  . Alcohol use: No  . Drug use: No     FAMILY HISTORY: Family History  Problem Relation Age of Onset  . Diabetes Mother   . Hypertension Mother   . Heart attack Mother   . Thyroid disease Mother   . Obesity Mother   . Cancer Father   . Liver disease Father   . Sleep apnea Father   . Alcoholism Father     ROS: Review of Systems  Constitutional: Positive for weight loss.  Respiratory: Negative for shortness of breath.   Cardiovascular: Negative for chest pain.  Endo/Heme/Allergies:       Positive hypoglycemia    PHYSICAL EXAM: Blood pressure (!) 144/64, pulse 67, temperature 98.1 F (36.7 C), temperature source Oral, height 5\' 1"  (1.549 m), weight 261 lb (118.4 kg), SpO2 94 %. Body mass index is 49.32 kg/m. Physical Exam  Constitutional: She is oriented to person, place, and time. She appears well-developed and well-nourished.  Cardiovascular: Normal rate.  Pulmonary/Chest: Effort normal.  Musculoskeletal: Normal range of motion.  Neurological: She is oriented to person, place, and time.  Skin: Skin is warm and dry.  Psychiatric: She has a normal mood and affect. Her behavior is normal.  Vitals reviewed.   RECENT LABS AND TESTS: BMET    Component Value Date/Time   NA 140 07/18/2017 1112   K 4.3 07/18/2017 1112   CL 102 07/18/2017 1112   CO2 22 07/18/2017 1112   GLUCOSE 95 07/18/2017 1112   GLUCOSE 214 (H) 01/17/2017 1459   BUN 31 (H) 07/18/2017 1112   CREATININE 1.30 (H) 07/18/2017 1112   CALCIUM 9.9 07/18/2017 1112   GFRNONAA 42 (L) 07/18/2017 1112   GFRAA 49 (L) 07/18/2017 1112   Lab Results  Component Value Date   HGBA1C 6.8 (H) 07/18/2017   HGBA1C 7.7 (H) 07/20/2016   HGBA1C 7.9 09/18/2014   No results found for: INSULIN CBC    Component Value Date/Time   WBC 10.0 07/18/2017 1112   WBC 8.6 09/22/2016 0733   RBC 4.57 07/18/2017  1112   RBC 4.31 09/22/2016 0733   HGB 13.0 07/18/2017 1112   HCT 39.6 07/18/2017 1112   PLT 284 09/22/2016 0733   MCV 87 07/18/2017 1112   MCH 28.4  07/18/2017 1112   MCH 28.5 09/22/2016 0733   MCHC 32.8 07/18/2017 1112   MCHC 31.4 09/22/2016 0733   RDW 15.3 07/18/2017 1112   LYMPHSABS 1.8 07/18/2017 1112   EOSABS 0.1 07/18/2017 1112   BASOSABS 0.0 07/18/2017 1112   Iron/TIBC/Ferritin/ %Sat No results found for: IRON, TIBC, FERRITIN, IRONPCTSAT Lipid Panel     Component Value Date/Time   CHOL 126 07/18/2017 1112   TRIG 98 07/18/2017 1112   HDL 43 07/18/2017 1112   LDLCALC 63 07/18/2017 1112   Hepatic Function Panel     Component Value Date/Time   PROT 7.7 07/18/2017 1112   ALBUMIN 4.3 07/18/2017 1112   AST 25 07/18/2017 1112   ALT 20 07/18/2017 1112   ALKPHOS 83 07/18/2017 1112   BILITOT 0.3 07/18/2017 1112      Component Value Date/Time   TSH 3.740 07/18/2017 1112    ASSESSMENT AND PLAN: Type 2 diabetes mellitus with hypoglycemia without coma, with long-term current use of insulin (HCC)  Essential hypertension  At risk for heart disease  Class 3 severe obesity with serious comorbidity and body mass index (BMI) of 45.0 to 49.9 in adult, unspecified obesity type (HCC)  PLAN:  Diabetes II Sendy has been given extensive diabetes education by myself today including ideal fasting and post-prandial blood glucose readings, individual ideal Hgb A1c goals and hypoglycemia prevention. We discussed the importance of good blood sugar control to decrease the likelihood of diabetic complications such as nephropathy, neuropathy, limb loss, blindness, coronary artery disease, and death. We discussed the importance of intensive lifestyle modification including diet, exercise and weight loss as the first line treatment for diabetes. Tauriel is advised to present BS log to Endocrinologist at scheduled visit today and to go by their recommendations with her insulin adjustment Per record review: Endocrinologist on 08/31/17 advised pt to decrease Lantus to 60 units BID and if BS is less than 70, she is instructed to lower evening dose of  lantus by 20 units and contact Endocrinologist. She agrees to follow up with our clinic in 2 weeks.   Hypertension We discussed sodium restriction, working on healthy weight loss, and a regular exercise program as the means to achieve improved blood pressure control. [name] agreed with this plan and agreed to follow up as directed. We will continue to monitor her blood pressure as well as her progress with the above lifestyle modifications. She will continue her medications as prescribed and will watch for signs of hypotension as she continues her lifestyle modifications. Leveda agrees to follow up with our clinic in 2 weeks.  Cardiovascular risk counselling [name] was given extended (15 minutes) coronary artery disease prevention counseling today. She is 67 y.o. female and has risk factors for heart disease including obesity, diabetes II, and hypertension. We discussed intensive lifestyle modifications today with an emphasis on specific weight loss instructions and strategies. Pt was also informed of the importance of increasing exercise and decreasing saturated fats to help prevent heart disease.  Obesity [name] is currently in the action stage of change. As such, her goal is to continue with weight loss efforts She has agreed to follow the Category 2 plan [name] has been instructed to work up to a goal of 150 minutes of combined cardio and strengthening exercise per week  for weight loss and overall health benefits. We discussed the following Behavioral Modification Strategies today: increasing lean protein intake and work on meal planning and easy cooking plans   [name] has agreed to follow up with our clinic in 2 weeks. She was informed of the importance of frequent follow up visits to maximize her success with intensive lifestyle modifications for her multiple health conditions.   OBESITY BEHAVIORAL INTERVENTION VISIT  Today's visit was # 3 out of 22.  Starting weight: 265 lbs Starting  date: 07/18/17 Today's weight : 261 lbs  Today's date: 08/31/2017 Total lbs lost to date: 4 (Patients must lose 7 lbs in the first 6 months to continue with counseling)   ASK: We discussed the diagnosis of obesity with [name] today and [name] agreed to give [name] permission to discuss obesity behavioral modification therapy today.  ASSESS: [name] has the diagnosis of obesity and her BMI today is 49.34 [name] is in the action stage of change   ADVISE: [name] was educated on the multiple health risks of obesity as well as the benefit of weight loss to improve her health. She was advised of the need for long term treatment and the importance of lifestyle modifications.  AGREE: Multiple dietary modification options and treatment options were discussed and  Lind agreed to the above obesity treatment plan.   Trude Mcburney, am acting as transcriptionist for Illa Level, PA-C I, Illa Level [facility], have reviewed this note and agree with its content

## 2017-08-31 NOTE — Telephone Encounter (Signed)
Patient states she saw Dr. Sharl MaKerr.  Dr. Sharl MaKerr lowered her Lantis to 60 2x daily.  However, if her sugars stayed low she could reduce the Lantis to 40 at night. [name]

## 2017-09-01 MED FILL — AMLODIPINE BESYLATE 5 MG TA: 5 | 90 days supply | Qty: 90 | Fill #1

## 2017-09-01 MED FILL — FREESTYLE LITE TEST STRIP: 90 days supply | Qty: 300 | Fill #0

## 2017-09-04 NOTE — Telephone Encounter (Signed)
Sahar aware. April, CMA

## 2017-09-11 MED FILL — DULoxetine HCL 60 MG CPEP: 60 | 30 days supply | Qty: 30 | Fill #2

## 2017-09-11 MED FILL — XARELTO 20 MG TABLET: 20 | 30 days supply | Qty: 30 | Fill #11

## 2017-09-21 ENCOUNTER — Ambulatory Visit (INDEPENDENT_AMBULATORY_CARE_PROVIDER_SITE_OTHER): Payer: 59 | Admitting: Physician Assistant

## 2017-09-21 VITALS — BP 135/75 | HR 61 | Temp 98.0°F | Ht 61.0 in | Wt 254.0 lb

## 2017-09-21 DIAGNOSIS — Z794 Long term (current) use of insulin: Secondary | ICD-10-CM | POA: Diagnosis not present

## 2017-09-21 DIAGNOSIS — E119 Type 2 diabetes mellitus without complications: Secondary | ICD-10-CM | POA: Diagnosis not present

## 2017-09-21 DIAGNOSIS — Z6841 Body Mass Index (BMI) 40.0 and over, adult: Secondary | ICD-10-CM | POA: Diagnosis not present

## 2017-09-21 NOTE — Progress Notes (Signed)
Office: [phone]  /  Fax: [phone]   HPI:   Chief Complaint: OBESITY [name] is here to discuss her progress with her obesity treatment plan. She is on the Category 2 plan and is following her eating plan approximately 85 % of the time. She states she is swimming for 30-45 minutes 3 times per week. [name] continues to do well with weight loss. She was on vacation and made mindful food choices.  Her weight is 254 lb (115.2 kg) today and has had a weight loss of 7 pounds over a period of 3 weeks since her last visit. She has lost 11 lbs since starting treatment with [name].  Diabetes II [name] has a diagnosis of diabetes type II. [name] states fasting BGs range between 70 and 90 and post prandial range between 80 and 110. She seen Endocrinologist who according to patient decreased her Lantus to 40 units BID. However she has been taking 50 units at night and 60 units in the morning. Medication compliance addressed with [name] as increasing her Lantus increases her risk of hypoglycemia. Also she is not taking Humalog. Last A1c was 6.8. She has been working on intensive lifestyle modifications including diet, exercise, and weight loss to help control her blood glucose levels.  ALLERGIES: Allergies  Allergen Reactions  . Actos [Pioglitazone Hydrochloride] Swelling and Other (See Comments)    Numbness in lips, HEADACHES SWELLING REACTION UNSPECIFIED   . Avandia [Rosiglitazone] Swelling    Numbness in lips, headaches  SWELLING REACTION UNSPECIFIED   . Tetanus Toxoids Other (See Comments)    Arm swelling, fainted, ?Respiratory distress  (horse serum)  . Food Nausea Only    Eggplant    MEDICATIONS: Current Outpatient Medications on File Prior to Visit  Medication Sig Dispense Refill  . amLODipine (NORVASC) 5 MG tablet Take 5 mg by mouth every evening.     [name] Kitchen amoxicillin (AMOXIL) 500 MG capsule as directed. PRIOR TO DENTAL PROCEDURES  0  . atorvastatin (LIPITOR) 40 MG tablet Take 40 mg by  mouth every evening.     . cholecalciferol (VITAMIN D) 1000 units tablet Take 1,000 Units by mouth every evening.     . DULoxetine (CYMBALTA) 60 MG capsule Take 60 mg by mouth daily.    . flecainide (TAMBOCOR) 50 MG tablet TAKE 1 TABLET BY MOUTH TWICE A DAY 180 tablet 2  . hydrochlorothiazide (MICROZIDE) 12.5 MG capsule Take 1 capsule by mouth daily.  3  . HYDROcodone-acetaminophen (NORCO/VICODIN) 5-325 MG tablet Take 1 tablet by mouth every 6 (six) hours as needed for moderate pain. 30 tablet 0  . indomethacin (INDOCIN) 50 MG capsule Take 50 mg by mouth 3 (three) times daily as needed (gout).    . insulin glargine (LANTUS) 100 UNIT/ML injection Inject 40 Units into the skin 2 (two) times daily.     . insulin lispro (HUMALOG) 100 UNIT/ML cartridge Inject 24 Units into the skin 3 (three) times daily with meals. UNLESS BLOOD SUGAR IS 100 OR LESS     . ketorolac (TORADOL) 10 MG tablet Take 10 mg by mouth 3 (three) times daily as needed (CHEST WALL PAIN/PLURACY).    [name] Kitchen levothyroxine (SYNTHROID, LEVOTHROID) 125 MCG tablet Take 125 mcg by mouth daily before breakfast.     . Melatonin 5 MG CAPS Take 5 mg by mouth at bedtime as needed (sleep).     . metFORMIN (GLUCOPHAGE-XR) 500 MG 24 hr tablet Take 1,000 mg by mouth daily with supper.    . methocarbamol (ROBAXIN)  500 MG tablet TAKE 1 TABLET BY MOUTH EVERY 6 HOURS AS NEEDED FOR MUSCLE SPASMS 60 tablet 0  . metoprolol tartrate (LOPRESSOR) 50 MG tablet Take 1 tablet (50 mg total) by mouth 2 (two) times daily. 180 tablet 2  . omeprazole (PRILOSEC) 20 MG capsule Take 20 mg by mouth every evening.     . TRULICITY 1.5 MG/0.5ML SOPN as directed.  11  . valsartan (DIOVAN) 320 MG tablet Take 1 tablet by mouth daily.  3  . Vitamin D, Ergocalciferol, (DRISDOL) 50000 units CAPS capsule Take 1 capsule (50,000 Units total) by mouth every 7 (seven) days. 4 capsule 0  . XARELTO 20 MG TABS tablet TAKE 1 TABLET BY MOUTH ONCE DAILY WITH SUPPER 30 tablet 11   No current  facility-administered medications on file prior to visit.     PAST MEDICAL HISTORY: Past Medical History:  Diagnosis Date  . A-fib (HCC)   . Anemia    hx of  . Arthritis   . Chest pain   . Constipation   . Depression   . Diabetes mellitus ORAL MEDS  . Diabetic retinopathy (HCC)   . Dyspnea    with minimal exertion-deconditioned  . Dyspnea   . Dysrhythmia    a-fib  . Food allergy   . GERD (gastroesophageal reflux disease)    occasional  . Gout   . History of kidney stones   . Hypercholesteremia   . Hyperlipidemia   . Hypertension   . Hypertensive kidney disease   . Hypothyroidism   . Knee pain, right   . 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 Obesity   . OSA on CPAP    uses CPAP nightly  . Osteoarthritis   . Palpitations   . Pinched nerve in neck   . Pleurisy   . Pneumonia    hx of  . PONV (postoperative nausea and vomiting)    for 3-4 days after general anesthesia  . Sciatica   . Swelling of knee joint, right   . Synovitis of knee RIGHT  . Trigger finger, left    left index  . Vitamin D deficiency     PAST SURGICAL HISTORY: Past Surgical History:  Procedure Laterality Date  . CESAREAN SECTION  X3  . KNEE ARTHROSCOPY  04/20/2011   Procedure: ARTHROSCOPY KNEE;  Surgeon: Loanne DrillingFrank V Aluisio, MD;  [facility];  Service: Orthopedics;  Laterality: Right;  WITH SYNOVECTOMY  . LEFT CARPAL TUNNEL / LEFT MIDDLE & RING FINGER TRIGGER RELEASE  08-26-2008  . LEFT SHOULDER ARTHROSCOPY W/ DEBRIDEMENT  09-09-2003  . LEFT SHOULDER ARTHROSCOPY/ LEFT THUMB TRIGGER RELEASE  02-22-2005  . PULLEY RELEASE LEFT LONG FINGER  07-14-2009  . RIGHT CARPAL TUNNEL/ RIGHT THUMB TRIGGER RELEASE'S  11-28-2006  . RIGHT SHOULDER ARTHROSCOPY W/ ROTATOR CUFF REPAIR  01-13-2004  . SHOULDER ARTHROSCOPY DISTAL CLAVICLE EXCISION AND OPEN ROTATOR CUFF REPAIR  09-07-2004   LEFT  . SHOULDER ARTHROSCOPY W/ ACROMIAL REPAIR  11-29-2005   LEFT  . TONSILLECTOMY AND  ADENOIDECTOMY  child  . TOTAL KNEE ARTHROPLASTY  12-14-2009   RIGHT  . TOTAL KNEE ARTHROPLASTY Left 11/05/2012   Procedure: LEFT TOTAL KNEE ARTHROPLASTY;  Surgeon: Loanne DrillingFrank V Aluisio, MD;  Location: [facility];  Service: Orthopedics;  Laterality: Left;  . TOTAL KNEE REVISION Right 07/29/2016   Procedure: RIGHT KNEE POLY-LINER EXCHANGE;  Surgeon: Kathryne HitchBlackman, Christopher Y, MD;  Location: [facility];  Service: Orthopedics;  Laterality: Right;  . TRIGGER FINGER RELEASE  Right 02/21/2013   Procedure: RIGHT RING A-1 PULLEY RELEASE    (MINOR PROCEDURE) ;  Surgeon: Wyn Forster., MD;  Location: [facility];  Service: Orthopedics;  Laterality: Right;  . TRIGGER FINGER RELEASE Left 09/22/2016   Procedure: RELEASE TRIGGER FINGER LEFT INDEX FINGER;  Surgeon: Kathryne Hitch, MD;  Location: [facility];  Service: Orthopedics;  Laterality: Left;  . TRIGGER FINGER RELEASE Right 01/20/2017   Procedure: RELEASE TRIGGER FINGER/A-1 PULLEY RIGHT INDEX FINGER;  Surgeon: Marcene Corning, MD;  Location: [facility];  Service: Orthopedics;  Laterality: Right;    SOCIAL HISTORY: Social History   Tobacco Use  . Smoking status: Former Smoker    Years: 5.00    Types: Cigarettes    Last attempt to quit: 04/12/1977    Years since quitting: 40.4  . Smokeless tobacco: Never Used  Substance Use Topics  . Alcohol use: No  . Drug use: No    FAMILY HISTORY: Family History  Problem Relation Age of Onset  . Diabetes Mother   . Hypertension Mother   . Heart attack Mother   . Thyroid disease Mother   . Obesity Mother   . Cancer Father   . Liver disease Father   . Sleep apnea Father   . Alcoholism Father     ROS: Review of Systems  Constitutional: Positive for weight loss.    PHYSICAL EXAM: Blood pressure 135/75, pulse 61, temperature 98 F (36.7 C), temperature source Oral, height 5\' 1"  (1.549 m), weight 254 lb (115.2 kg), SpO2 95 %. Body mass index is 47.99 kg/m. Physical Exam    Constitutional: She is oriented to person, place, and time. She appears well-developed and well-nourished.  Cardiovascular: Normal rate.  Pulmonary/Chest: Effort normal.  Musculoskeletal: Normal range of motion.  Neurological: She is oriented to person, place, and time.  Skin: Skin is warm and dry.  Psychiatric: She has a normal mood and affect. Her behavior is normal.  Vitals reviewed.   RECENT LABS AND TESTS: BMET    Component Value Date/Time   NA 140 07/18/2017 1112   K 4.3 07/18/2017 1112   CL 102 07/18/2017 1112   CO2 22 07/18/2017 1112   GLUCOSE 95 07/18/2017 1112   GLUCOSE 214 (H) 01/17/2017 1459   BUN 31 (H) 07/18/2017 1112   CREATININE 1.30 (H) 07/18/2017 1112   CALCIUM 9.9 07/18/2017 1112   GFRNONAA 42 (L) 07/18/2017 1112   GFRAA 49 (L) 07/18/2017 1112   Lab Results  Component Value Date   HGBA1C 6.8 (H) 07/18/2017   HGBA1C 7.7 (H) 07/20/2016   HGBA1C 7.9 09/18/2014   No results found for: INSULIN CBC    Component Value Date/Time   WBC 10.0 07/18/2017 1112   WBC 8.6 09/22/2016 0733   RBC 4.57 07/18/2017 1112   RBC 4.31 09/22/2016 0733   HGB 13.0 07/18/2017 1112   HCT 39.6 07/18/2017 1112   PLT 284 09/22/2016 0733   MCV 87 07/18/2017 1112   MCH 28.4 07/18/2017 1112   MCH 28.5 09/22/2016 0733   MCHC 32.8 07/18/2017 1112   MCHC 31.4 09/22/2016 0733   RDW 15.3 07/18/2017 1112   LYMPHSABS 1.8 07/18/2017 1112   EOSABS 0.1 07/18/2017 1112   BASOSABS 0.0 07/18/2017 1112   Iron/TIBC/Ferritin/ %Sat No results found for: IRON, TIBC, FERRITIN, IRONPCTSAT Lipid Panel     Component Value Date/Time   CHOL 126 07/18/2017 1112   TRIG 98 07/18/2017 1112   HDL 43 07/18/2017 1112  LDLCALC 63 07/18/2017 1112   Hepatic Function Panel     Component Value Date/Time   PROT 7.7 07/18/2017 1112   ALBUMIN 4.3 07/18/2017 1112   AST 25 07/18/2017 1112   ALT 20 07/18/2017 1112   ALKPHOS 83 07/18/2017 1112   BILITOT 0.3 07/18/2017 1112      Component Value  Date/Time   TSH 3.740 07/18/2017 1112    ASSESSMENT AND PLAN: Type 2 diabetes mellitus without complication, with long-term current use of insulin (HCC)  Class 3 severe obesity with serious comorbidity and body mass index (BMI) of 45.0 to 49.9 in adult, unspecified obesity type (HCC)  PLAN:  Diabetes II [name] has been given extensive diabetes education by myself today including ideal fasting and post-prandial blood glucose readings, individual ideal Hgb A1c goals and hypoglycemia prevention. We discussed the importance of good blood sugar control to decrease the likelihood of diabetic complications such as nephropathy, neuropathy, limb loss, blindness, coronary artery disease, and death. We discussed the importance of intensive lifestyle modification including diet, exercise and weight loss as the first line treatment for diabetes. [name] agrees to continue Lantus at 40 units BID and she agrees to follow up with our clinic in 3 weeks.  We spent > than 50% of the 15 minute visit on the counseling as documented in the note.  Obesity [name] is currently in the action stage of change. As such, her goal is to continue with weight loss efforts She has agreed to follow the Category 2 plan [name] has been instructed to work up to a goal of 150 minutes of combined cardio and strengthening exercise per week for weight loss and overall health benefits. We discussed the following Behavioral Modification Strategies today: increasing lean protein intake and work on meal planning and easy cooking plans   [name] has agreed to follow up with our clinic in 3 weeks. She was informed of the importance of frequent follow up visits to maximize her success with intensive lifestyle modifications for her multiple health conditions.   OBESITY BEHAVIORAL INTERVENTION VISIT  Today's visit was # 4 out of 22.  Starting weight: 265 lbs Starting date: 07/18/17 Today's weight : 254 lbs  Today's date:  09/21/2017 Total lbs lost to date: 11 (Patients must lose 7 lbs in the first 6 months to continue with counseling)   ASK: We discussed the diagnosis of obesity with [name] today and [name] agreed to give [name] permission to discuss obesity behavioral modification therapy today.  ASSESS: [name] has the diagnosis of obesity and her BMI today is 48.02 [name] is in the action stage of change   ADVISE: [name] was educated on the multiple health risks of obesity as well as the benefit of weight loss to improve her health. She was advised of the need for long term treatment and the importance of lifestyle modifications.  AGREE: Multiple dietary modification options and treatment options were discussed and  [name] agreed to the above obesity treatment plan.   [name], am acting as transcriptionist for Illa Level, PA-C I, Illa Level [facility], have reviewed this note and agree with its content

## 2017-09-28 MED FILL — TRULICITY 1.5 MG/0.5 ML PEN: 1.5 | 28 days supply | Qty: 2 | Fill #8

## 2017-10-03 MED FILL — VALSARTAN 320 MG TAB: 320 | 30 days supply | Qty: 30 | Fill #8

## 2017-10-04 ENCOUNTER — Other Ambulatory Visit: Payer: Self-pay | Admitting: Cardiovascular Disease

## 2017-10-04 MED FILL — ATORVASTATIN 40 MG TABLET: 40 | 90 days supply | Qty: 90 | Fill #1

## 2017-10-04 MED FILL — FLECAINIDE ACETATE 50 MG TA: 50 | 90 days supply | Qty: 180 | Fill #1

## 2017-10-04 MED FILL — LEVOTHYROXINE 125 MCG TABLE: 125 | 30 days supply | Qty: 30 | Fill #0

## 2017-10-04 NOTE — Telephone Encounter (Signed)
Pt is a 67 yr old female. Last noted weight was 122Kg. Pt saw Dr. Eden EmmsNishan on 05/17/17. SCr on 07/18/17 was 1.30. CrCl is 8280mL/min. Will refill Xarelto 20mg  QD.

## 2017-10-05 MED FILL — XARELTO 20 MG TABLET: 20 | 30 days supply | Qty: 30 | Fill #0

## 2017-10-12 ENCOUNTER — Ambulatory Visit (INDEPENDENT_AMBULATORY_CARE_PROVIDER_SITE_OTHER): Payer: 59 | Admitting: Family Medicine

## 2017-10-12 VITALS — BP 135/70 | HR 74 | Temp 97.9°F | Ht 61.0 in | Wt 254.0 lb

## 2017-10-12 DIAGNOSIS — Z6841 Body Mass Index (BMI) 40.0 and over, adult: Secondary | ICD-10-CM

## 2017-10-12 DIAGNOSIS — E119 Type 2 diabetes mellitus without complications: Secondary | ICD-10-CM | POA: Diagnosis not present

## 2017-10-12 DIAGNOSIS — Z794 Long term (current) use of insulin: Secondary | ICD-10-CM

## 2017-10-12 DIAGNOSIS — E559 Vitamin D deficiency, unspecified: Secondary | ICD-10-CM

## 2017-10-12 NOTE — Progress Notes (Signed)
Office: [phone]  /  Fax: [phone]   HPI:   Chief Complaint: OBESITY [name] is here to discuss her progress with her obesity treatment plan. She is on the Category 2 plan and is following her eating plan approximately 60 % of the time. She states she is swimming for 60 minutes 2-3 times per week. [name] is occasionally missing dinner secondary to being busy crafting. So only eating a small portion.  Her weight is 254 lb (115.2 kg) today and has not lost weight since her last visit. She has lost 11 lbs since starting treatment with [name].  Diabetes II [name] has a diagnosis of diabetes type II. [name] states fasting BGs range between 69 and 166 and post prandial range between 61 and 170. She denies hypoglycemia. Last A1c was 6.8. She has been working on intensive lifestyle modifications including diet, exercise, and weight loss to help control her blood glucose levels.  Vitamin D Deficiency [name] has a diagnosis of vitamin D deficiency. She is currently taking prescription Vit D. She notes fatigue and denies nausea, vomiting or muscle weakness.  ALLERGIES: Allergies  Allergen Reactions  . Actos [Pioglitazone Hydrochloride] Swelling and Other (See Comments)    Numbness in lips, HEADACHES SWELLING REACTION UNSPECIFIED   . Avandia [Rosiglitazone] Swelling    Numbness in lips, headaches  SWELLING REACTION UNSPECIFIED   . Tetanus Toxoids Other (See Comments)    Arm swelling, fainted, ?Respiratory distress  (horse serum)  . Food Nausea Only    Eggplant    MEDICATIONS: Current Outpatient Medications on File Prior to Visit  Medication Sig Dispense Refill  . amLODipine (NORVASC) 5 MG tablet Take 5 mg by mouth every evening.     [name] amoxicillin (AMOXIL) 500 MG capsule as directed. PRIOR TO DENTAL PROCEDURES  0  . atorvastatin (LIPITOR) 40 MG tablet Take 40 mg by mouth every evening.     . cholecalciferol (VITAMIN D) 1000 units tablet Take 1,000 Units by mouth every evening.     .  DULoxetine (CYMBALTA) 60 MG capsule Take 60 mg by mouth daily.    . flecainide (TAMBOCOR) 50 MG tablet TAKE 1 TABLET BY MOUTH TWICE A DAY 180 tablet 2  . hydrochlorothiazide (MICROZIDE) 12.5 MG capsule Take 1 capsule by mouth daily.  3  . HYDROcodone-acetaminophen (NORCO/VICODIN) 5-325 MG tablet Take 1 tablet by mouth every 6 (six) hours as needed for moderate pain. 30 tablet 0  . indomethacin (INDOCIN) 50 MG capsule Take 50 mg by mouth 3 (three) times daily as needed (gout).    . insulin glargine (LANTUS) 100 UNIT/ML injection Inject 40 Units into the skin 2 (two) times daily.     . insulin lispro (HUMALOG) 100 UNIT/ML cartridge Inject 24 Units into the skin 3 (three) times daily with meals. UNLESS BLOOD SUGAR IS 100 OR LESS     . ketorolac (TORADOL) 10 MG tablet Take 10 mg by mouth 3 (three) times daily as needed (CHEST WALL PAIN/PLURACY).    [name] levothyroxine (SYNTHROID, LEVOTHROID) 125 MCG tablet Take 125 mcg by mouth daily before breakfast.     . Melatonin 5 MG CAPS Take 5 mg by mouth at bedtime as needed (sleep).     . metFORMIN (GLUCOPHAGE-XR) 500 MG 24 hr tablet Take 1,000 mg by mouth daily with supper.    . methocarbamol (ROBAXIN) 500 MG tablet TAKE 1 TABLET BY MOUTH EVERY 6 HOURS AS NEEDED FOR MUSCLE SPASMS 60 tablet 0  . metoprolol tartrate (LOPRESSOR) 50 MG tablet Take  1 tablet (50 mg total) by mouth 2 (two) times daily. 180 tablet 2  . omeprazole (PRILOSEC) 20 MG capsule Take 20 mg by mouth every evening.     . TRULICITY 1.5 MG/0.5ML SOPN as directed.  11  . valsartan (DIOVAN) 320 MG tablet Take 1 tablet by mouth daily.  3  . Vitamin D, Ergocalciferol, (DRISDOL) 50000 units CAPS capsule Take 1 capsule (50,000 Units total) by mouth every 7 (seven) days. 4 capsule 0  . XARELTO 20 MG TABS tablet TAKE 1 TABLET BY MOUTH ONCE DAILY WITH SUPPER 30 tablet 9   No current facility-administered medications on file prior to visit.     PAST MEDICAL HISTORY: Past Medical History:  Diagnosis Date   . A-fib (HCC)   . Anemia    hx of  . Arthritis   . Chest pain   . Constipation   . Depression   . Diabetes mellitus ORAL MEDS  . Diabetic retinopathy (HCC)   . Dyspnea    with minimal exertion-deconditioned  . Dyspnea   . Dysrhythmia    a-fib  . Food allergy   . GERD (gastroesophageal reflux disease)    occasional  . Gout   . History of kidney stones   . Hypercholesteremia   . Hyperlipidemia   . Hypertension   . Hypertensive kidney disease   . Hypothyroidism   . Knee pain, right   . 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 Obesity   . OSA on CPAP    uses CPAP nightly  . Osteoarthritis   . Palpitations   . Pinched nerve in neck   . Pleurisy   . Pneumonia    hx of  . PONV (postoperative nausea and vomiting)    for 3-4 days after general anesthesia  . Sciatica   . Swelling of knee joint, right   . Synovitis of knee RIGHT  . Trigger finger, left    left index  . Vitamin D deficiency     PAST SURGICAL HISTORY: Past Surgical History:  Procedure Laterality Date  . CESAREAN SECTION  X3  . KNEE ARTHROSCOPY  04/20/2011   Procedure: ARTHROSCOPY KNEE;  Surgeon: Loanne DrillingFrank V Aluisio, MD;  [facility];  Service: Orthopedics;  Laterality: Right;  WITH SYNOVECTOMY  . LEFT CARPAL TUNNEL / LEFT MIDDLE & RING FINGER TRIGGER RELEASE  08-26-2008  . LEFT SHOULDER ARTHROSCOPY W/ DEBRIDEMENT  09-09-2003  . LEFT SHOULDER ARTHROSCOPY/ LEFT THUMB TRIGGER RELEASE  02-22-2005  . PULLEY RELEASE LEFT LONG FINGER  07-14-2009  . RIGHT CARPAL TUNNEL/ RIGHT THUMB TRIGGER RELEASE'S  11-28-2006  . RIGHT SHOULDER ARTHROSCOPY W/ ROTATOR CUFF REPAIR  01-13-2004  . SHOULDER ARTHROSCOPY DISTAL CLAVICLE EXCISION AND OPEN ROTATOR CUFF REPAIR  09-07-2004   LEFT  . SHOULDER ARTHROSCOPY W/ ACROMIAL REPAIR  11-29-2005   LEFT  . TONSILLECTOMY AND ADENOIDECTOMY  child  . TOTAL KNEE ARTHROPLASTY  12-14-2009   RIGHT  . TOTAL KNEE ARTHROPLASTY Left 11/05/2012   Procedure:  LEFT TOTAL KNEE ARTHROPLASTY;  Surgeon: Loanne DrillingFrank V Aluisio, MD;  Location: [facility];  Service: Orthopedics;  Laterality: Left;  . TOTAL KNEE REVISION Right 07/29/2016   Procedure: RIGHT KNEE POLY-LINER EXCHANGE;  Surgeon: Kathryne HitchBlackman, Christopher Y, MD;  Location: [facility];  Service: Orthopedics;  Laterality: Right;  . TRIGGER FINGER RELEASE Right 02/21/2013   Procedure: RIGHT RING A-1 PULLEY RELEASE    (MINOR PROCEDURE) ;  Surgeon: Wyn Forsterobert V Sypher Jr., MD;  Location: [facility];  Service: Orthopedics;  Laterality: Right;  . TRIGGER FINGER RELEASE Left 09/22/2016   Procedure: RELEASE TRIGGER FINGER LEFT INDEX FINGER;  Surgeon: Kathryne Hitch, MD;  Location: [facility];  Service: Orthopedics;  Laterality: Left;  . TRIGGER FINGER RELEASE Right 01/20/2017   Procedure: RELEASE TRIGGER FINGER/A-1 PULLEY RIGHT INDEX FINGER;  Surgeon: Marcene Corning, MD;  Location: [facility];  Service: Orthopedics;  Laterality: Right;    SOCIAL HISTORY: Social History   Tobacco Use  . Smoking status: Former Smoker    Years: 5.00    Types: Cigarettes    Last attempt to quit: 04/12/1977    Years since quitting: 40.5  . Smokeless tobacco: Never Used  Substance Use Topics  . Alcohol use: No  . Drug use: No    FAMILY HISTORY: Family History  Problem Relation Age of Onset  . Diabetes Mother   . Hypertension Mother   . Heart attack Mother   . Thyroid disease Mother   . Obesity Mother   . Cancer Father   . Liver disease Father   . Sleep apnea Father   . Alcoholism Father     ROS: Review of Systems  Constitutional: Positive for malaise/fatigue. Negative for weight loss.  Gastrointestinal: Negative for nausea and vomiting.  Musculoskeletal:       Negative muscle weakness  Endo/Heme/Allergies:       Negative hypoglycemia    PHYSICAL EXAM: Blood pressure 135/70, pulse 74, temperature 97.9 F (36.6 C), temperature source Oral, height 5\' 1"  (1.549 m), weight 254 lb (115.2 kg),  SpO2 95 %. Body mass index is 47.99 kg/m. Physical Exam  Constitutional: She is oriented to person, place, and time. She appears well-developed and well-nourished.  Cardiovascular: Normal rate.  Pulmonary/Chest: Effort normal.  Musculoskeletal: Normal range of motion.  Neurological: She is oriented to person, place, and time.  Skin: Skin is warm and dry.  Psychiatric: She has a normal mood and affect. Her behavior is normal.  Vitals reviewed.   RECENT LABS AND TESTS: BMET    Component Value Date/Time   NA 140 07/18/2017 1112   K 4.3 07/18/2017 1112   CL 102 07/18/2017 1112   CO2 22 07/18/2017 1112   GLUCOSE 95 07/18/2017 1112   GLUCOSE 214 (H) 01/17/2017 1459   BUN 31 (H) 07/18/2017 1112   CREATININE 1.30 (H) 07/18/2017 1112   CALCIUM 9.9 07/18/2017 1112   GFRNONAA 42 (L) 07/18/2017 1112   GFRAA 49 (L) 07/18/2017 1112   Lab Results  Component Value Date   HGBA1C 6.8 (H) 07/18/2017   HGBA1C 7.7 (H) 07/20/2016   HGBA1C 7.9 09/18/2014   No results found for: INSULIN CBC    Component Value Date/Time   WBC 10.0 07/18/2017 1112   WBC 8.6 09/22/2016 0733   RBC 4.57 07/18/2017 1112   RBC 4.31 09/22/2016 0733   HGB 13.0 07/18/2017 1112   HCT 39.6 07/18/2017 1112   PLT 284 09/22/2016 0733   MCV 87 07/18/2017 1112   MCH 28.4 07/18/2017 1112   MCH 28.5 09/22/2016 0733   MCHC 32.8 07/18/2017 1112   MCHC 31.4 09/22/2016 0733   RDW 15.3 07/18/2017 1112   LYMPHSABS 1.8 07/18/2017 1112   EOSABS 0.1 07/18/2017 1112   BASOSABS 0.0 07/18/2017 1112   Iron/TIBC/Ferritin/ %Sat No results found for: IRON, TIBC, FERRITIN, IRONPCTSAT Lipid Panel     Component Value Date/Time   CHOL 126 07/18/2017 1112   TRIG 98 07/18/2017 1112   HDL 43 07/18/2017  1112   LDLCALC 63 07/18/2017 1112   Hepatic Function Panel     Component Value Date/Time   PROT 7.7 07/18/2017 1112   ALBUMIN 4.3 07/18/2017 1112   AST 25 07/18/2017 1112   ALT 20 07/18/2017 1112   ALKPHOS 83 07/18/2017 1112     BILITOT 0.3 07/18/2017 1112      Component Value Date/Time   TSH 3.740 07/18/2017 1112  Results for LADON, HENEY (MRN [id number]) as of 10/12/2017 17:08  Ref. Range 07/18/2017 11:12  Vitamin D, 25-Hydroxy Latest Ref Range: 30.0 - 100.0 ng/mL 30.5    ASSESSMENT AND PLAN: Vitamin D deficiency  Type 2 diabetes mellitus without complication, with long-term current use of insulin (HCC)  Class 3 severe obesity with serious comorbidity and body mass index (BMI) of 45.0 to 49.9 in adult, unspecified obesity type (HCC)  PLAN:  Diabetes II Kendria has been given extensive diabetes education by myself today including ideal fasting and post-prandial blood glucose readings, individual ideal Hgb A1c goals and hypoglycemia prevention. We discussed the importance of good blood sugar control to decrease the likelihood of diabetic complications such as nephropathy, neuropathy, limb loss, blindness, coronary artery disease, and death. We discussed the importance of intensive lifestyle modification including diet, exercise and weight loss as the first line treatment for diabetes. Samara agrees to continue current medications of Lantus and metformin, and she is to continue to check BGs BID. Eris agrees to follow up with our clinic in 2 weeks.  Vitamin D Deficiency Saharra was informed that low vitamin D levels contributes to fatigue and are associated with obesity, breast, and colon cancer. Roxane agrees to continue taking prescription Vit D @50 ,000 IU every week, no refill needed. She will follow up for routine testing of vitamin D, at least 2-3 times per year. She was informed of the risk of over-replacement of vitamin D and agrees to not increase her dose unless she discusses this with [name] first. Denissa agrees to follow up with our clinic in 2 weeks.  We spent > than 50% of the 15 minute visit on the counseling as documented in the note.  Obesity Eleanna is currently in the action stage of change. As such,  her goal is to continue with weight loss efforts She has agreed to follow the Category 2 plan Jacinta has been instructed to work up to a goal of 150 minutes of combined cardio and strengthening exercise per week for weight loss and overall health benefits. We discussed the following Behavioral Modification Strategies today: increasing lean protein intake, increasing vegetables, work on meal planning and easy cooking plans, and no skipping meals Getting back to not skipping dinner, set alarm to remind herself.  Everett has agreed to follow up with our clinic in 2 weeks. She was informed of the importance of frequent follow up visits to maximize her success with intensive lifestyle modifications for her multiple health conditions.   OBESITY BEHAVIORAL INTERVENTION VISIT  Today's visit was # 5 out of 22.  Starting weight: 265 lbs Starting date: 07/18/17 Today's weight : 254 lbs Today's date: 10/12/2017 Total lbs lost to date: 11    ASK: We discussed the diagnosis of obesity with Alinda Deem today and Gaia agreed to give [name] permission to discuss obesity behavioral modification therapy today.  ASSESS: Kayani has the diagnosis of obesity and her BMI today is 48.02 Ambrosia is in the action stage of change   ADVISE: Shaunae was educated on the multiple health risks of  obesity as well as the benefit of weight loss to improve her health. She was advised of the need for long term treatment and the importance of lifestyle modifications.  AGREE: Multiple dietary modification options and treatment options were discussed and  Dany agreed to the above obesity treatment plan.  I, Burt Knack, am acting as transcriptionist for Debbra Riding, MD  I have reviewed the above documentation for accuracy and completeness, and I agree with the above. - Debbra Riding, MD

## 2017-10-23 ENCOUNTER — Other Ambulatory Visit (INDEPENDENT_AMBULATORY_CARE_PROVIDER_SITE_OTHER): Payer: Self-pay | Admitting: Orthopaedic Surgery

## 2017-10-23 MED FILL — DULoxetine HCL 60 MG CPEP: 60 | 30 days supply | Qty: 30 | Fill #3

## 2017-10-23 MED FILL — METHOCARBAMOL 500 MG TABLET: 500 | 15 days supply | Qty: 60 | Fill #0

## 2017-10-23 NOTE — Telephone Encounter (Signed)
Please advise 

## 2017-10-26 ENCOUNTER — Ambulatory Visit (INDEPENDENT_AMBULATORY_CARE_PROVIDER_SITE_OTHER): Payer: 59 | Admitting: Family Medicine

## 2017-10-26 VITALS — BP 112/66 | HR 4 | Temp 97.8°F | Ht 61.0 in | Wt 251.0 lb

## 2017-10-26 DIAGNOSIS — E119 Type 2 diabetes mellitus without complications: Secondary | ICD-10-CM | POA: Diagnosis not present

## 2017-10-26 DIAGNOSIS — Z6841 Body Mass Index (BMI) 40.0 and over, adult: Secondary | ICD-10-CM

## 2017-10-26 DIAGNOSIS — Z794 Long term (current) use of insulin: Secondary | ICD-10-CM | POA: Diagnosis not present

## 2017-10-26 MED FILL — TRULICITY 1.5 MG/0.5 ML PEN: 1.5 | 28 days supply | Qty: 2 | Fill #9

## 2017-10-30 NOTE — Progress Notes (Signed)
Office: [phone]  /  Fax: [phone]   HPI:   Chief Complaint: OBESITY [name] is here to discuss her progress with her obesity treatment plan. She is on the Category 2 plan and is following her eating plan approximately 75 % of the time. She states she is swimming 60 minutes 1 time per week. [name] continues to do well with weight loss on the category 2 plan. Hunger is controlled.  Her weight is 251 lb (113.9 kg) today and has had a weight loss of 3 pounds over a period of 2 weeks since her last visit. She has lost 14 lbs since starting treatment with us.  Diabetes II [name] has a diagnosis of diabetes type II. [name] states BGs range between 74 and 194. She is on Lantus 40 twice daily and denies any hypoglycemic episodes. Last A1c was at 6.8. She has been working on intensive lifestyle modifications including diet, exercise, and weight loss to help control her blood glucose levels.  ALLERGIES: Allergies  Allergen Reactions  . Actos [Pioglitazone Hydrochloride] Swelling and Other (See Comments)    Numbness in lips, HEADACHES SWELLING REACTION UNSPECIFIED   . Avandia [Rosiglitazone] Swelling    Numbness in lips, headaches  SWELLING REACTION UNSPECIFIED   . Tetanus Toxoids Other (See Comments)    Arm swelling, fainted, ?Respiratory distress  (horse serum)  . Food Nausea Only    Eggplant    MEDICATIONS: Current Outpatient Medications on File Prior to Visit  Medication Sig Dispense Refill  . amLODipine (NORVASC) 5 MG tablet Take 5 mg by mouth every evening.     [name]. amoxicillin (AMOXIL) 500 MG capsule as directed. PRIOR TO DENTAL PROCEDURES  0  . atorvastatin (LIPITOR) 40 MG tablet Take 40 mg by mouth every evening.     . cholecalciferol (VITAMIN D) 1000 units tablet Take 1,000 Units by mouth every evening.     . DULoxetine (CYMBALTA) 60 MG capsule Take 60 mg by mouth daily.    . flecainide (TAMBOCOR) 50 MG tablet TAKE 1 TABLET BY MOUTH TWICE A DAY 180 tablet 2  .  hydrochlorothiazide (MICROZIDE) 12.5 MG capsule Take 1 capsule by mouth daily.  3  . HYDROcodone-acetaminophen (NORCO/VICODIN) 5-325 MG tablet Take 1 tablet by mouth every 6 (six) hours as needed for moderate pain. 30 tablet 0  . indomethacin (INDOCIN) 50 MG capsule Take 50 mg by mouth 3 (three) times daily as needed (gout).    . insulin glargine (LANTUS) 100 UNIT/ML injection Inject 40 Units into the skin 2 (two) times daily.     . insulin lispro (HUMALOG) 100 UNIT/ML cartridge Inject 24 Units into the skin 3 (three) times daily with meals. UNLESS BLOOD SUGAR IS 100 OR LESS     . ketorolac (TORADOL) 10 MG tablet Take 10 mg by mouth 3 (three) times daily as needed (CHEST WALL PAIN/PLURACY).    [name]. levothyroxine (SYNTHROID, LEVOTHROID) 125 MCG tablet Take 125 mcg by mouth daily before breakfast.     . Melatonin 5 MG CAPS Take 5 mg by mouth at bedtime as needed (sleep).     . metFORMIN (GLUCOPHAGE-XR) 500 MG 24 hr tablet Take 1,000 mg by mouth daily with supper.    . methocarbamol (ROBAXIN) 500 MG tablet TAKE 1 TABLET BY MOUTH EVERY 6 HOURS AS NEEDED FOR MUSCLE SPASMS 60 tablet 0  . metoprolol tartrate (LOPRESSOR) 50 MG tablet Take 1 tablet (50 mg total) by mouth 2 (two) times daily. 180 tablet 2  . omeprazole (PRILOSEC) 20  MG capsule Take 20 mg by mouth every evening.     . TRULICITY 1.5 MG/0.5ML SOPN as directed.  11  . valsartan (DIOVAN) 320 MG tablet Take 1 tablet by mouth daily.  3  . Vitamin D, Ergocalciferol, (DRISDOL) 50000 units CAPS capsule Take 1 capsule (50,000 Units total) by mouth every 7 (seven) days. 4 capsule 0  . XARELTO 20 MG TABS tablet TAKE 1 TABLET BY MOUTH ONCE DAILY WITH SUPPER 30 tablet 9   No current facility-administered medications on file prior to visit.     PAST MEDICAL HISTORY: Past Medical History:  Diagnosis Date  . A-fib (HCC)   . Anemia    hx of  . Arthritis   . Chest pain   . Constipation   . Depression   . Diabetes mellitus ORAL MEDS  . Diabetic  retinopathy (HCC)   . Dyspnea    with minimal exertion-deconditioned  . Dyspnea   . Dysrhythmia    a-fib  . Food allergy   . GERD (gastroesophageal reflux disease)    occasional  . Gout   . History of kidney stones   . Hypercholesteremia   . Hyperlipidemia   . Hypertension   . Hypertensive kidney disease   . Hypothyroidism   . Knee pain, right   . 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 Obesity   . OSA on CPAP    uses CPAP nightly  . Osteoarthritis   . Palpitations   . Pinched nerve in neck   . Pleurisy   . Pneumonia    hx of  . PONV (postoperative nausea and vomiting)    for 3-4 days after general anesthesia  . Sciatica   . Swelling of knee joint, right   . Synovitis of knee RIGHT  . Trigger finger, left    left index  . Vitamin D deficiency     PAST SURGICAL HISTORY: Past Surgical History:  Procedure Laterality Date  . CESAREAN SECTION  X3  . KNEE ARTHROSCOPY  04/20/2011   Procedure: ARTHROSCOPY KNEE;  Surgeon: Loanne DrillingFrank V Aluisio, MD;  [facility];  Service: Orthopedics;  Laterality: Right;  WITH SYNOVECTOMY  . LEFT CARPAL TUNNEL / LEFT MIDDLE & RING FINGER TRIGGER RELEASE  08-26-2008  . LEFT SHOULDER ARTHROSCOPY W/ DEBRIDEMENT  09-09-2003  . LEFT SHOULDER ARTHROSCOPY/ LEFT THUMB TRIGGER RELEASE  02-22-2005  . PULLEY RELEASE LEFT LONG FINGER  07-14-2009  . RIGHT CARPAL TUNNEL/ RIGHT THUMB TRIGGER RELEASE'S  11-28-2006  . RIGHT SHOULDER ARTHROSCOPY W/ ROTATOR CUFF REPAIR  01-13-2004  . SHOULDER ARTHROSCOPY DISTAL CLAVICLE EXCISION AND OPEN ROTATOR CUFF REPAIR  09-07-2004   LEFT  . SHOULDER ARTHROSCOPY W/ ACROMIAL REPAIR  11-29-2005   LEFT  . TONSILLECTOMY AND ADENOIDECTOMY  child  . TOTAL KNEE ARTHROPLASTY  12-14-2009   RIGHT  . TOTAL KNEE ARTHROPLASTY Left 11/05/2012   Procedure: LEFT TOTAL KNEE ARTHROPLASTY;  Surgeon: Loanne DrillingFrank V Aluisio, MD;  Location: [facility];  Service: Orthopedics;  Laterality: Left;  . TOTAL KNEE REVISION Right  07/29/2016   Procedure: RIGHT KNEE POLY-LINER EXCHANGE;  Surgeon: Kathryne HitchBlackman, Christopher Y, MD;  Location: [facility];  Service: Orthopedics;  Laterality: Right;  . TRIGGER FINGER RELEASE Right 02/21/2013   Procedure: RIGHT RING A-1 PULLEY RELEASE    (MINOR PROCEDURE) ;  Surgeon: Wyn Forsterobert V Sypher Jr., MD;  [facility];  Service: Orthopedics;  Laterality: Right;  . TRIGGER FINGER RELEASE Left 09/22/2016   Procedure: RELEASE TRIGGER  FINGER LEFT INDEX FINGER;  Surgeon: Kathryne Hitch, MD;  Location: [facility];  Service: Orthopedics;  Laterality: Left;  . TRIGGER FINGER RELEASE Right 01/20/2017   Procedure: RELEASE TRIGGER FINGER/A-1 PULLEY RIGHT INDEX FINGER;  Surgeon: Marcene Corning, MD;  Location: [facility];  Service: Orthopedics;  Laterality: Right;    SOCIAL HISTORY: Social History   Tobacco Use  . Smoking status: Former Smoker    Years: 5.00    Types: Cigarettes    Last attempt to quit: 04/12/1977    Years since quitting: 40.5  . Smokeless tobacco: Never Used  Substance Use Topics  . Alcohol use: No  . Drug use: No    FAMILY HISTORY: Family History  Problem Relation Age of Onset  . Diabetes Mother   . Hypertension Mother   . Heart attack Mother   . Thyroid disease Mother   . Obesity Mother   . Cancer Father   . Liver disease Father   . Sleep apnea Father   . Alcoholism Father     ROS: Review of Systems  Constitutional: Positive for weight loss.  Endo/Heme/Allergies:       Negative for hypoglycemia    PHYSICAL EXAM: Blood pressure 112/66, pulse (!) 4, temperature 97.8 F (36.6 C), temperature source Oral, height 5\' 1"  (1.549 m), weight 251 lb (113.9 kg), SpO2 94 %. Body mass index is 47.43 kg/m. Physical Exam  Constitutional: She is oriented to person, place, and time. She appears well-developed and well-nourished.  Cardiovascular: Normal rate.  Pulmonary/Chest: Effort normal.  Musculoskeletal: Normal range of motion.    Neurological: She is oriented to person, place, and time.  Skin: Skin is warm and dry.  Psychiatric: She has a normal mood and affect. Her behavior is normal.  Vitals reviewed.   RECENT LABS AND TESTS: BMET    Component Value Date/Time   NA 140 07/18/2017 1112   K 4.3 07/18/2017 1112   CL 102 07/18/2017 1112   CO2 22 07/18/2017 1112   GLUCOSE 95 07/18/2017 1112   GLUCOSE 214 (H) 01/17/2017 1459   BUN 31 (H) 07/18/2017 1112   CREATININE 1.30 (H) 07/18/2017 1112   CALCIUM 9.9 07/18/2017 1112   GFRNONAA 42 (L) 07/18/2017 1112   GFRAA 49 (L) 07/18/2017 1112   Lab Results  Component Value Date   HGBA1C 6.8 (H) 07/18/2017   HGBA1C 7.7 (H) 07/20/2016   HGBA1C 7.9 09/18/2014   No results found for: INSULIN CBC    Component Value Date/Time   WBC 10.0 07/18/2017 1112   WBC 8.6 09/22/2016 0733   RBC 4.57 07/18/2017 1112   RBC 4.31 09/22/2016 0733   HGB 13.0 07/18/2017 1112   HCT 39.6 07/18/2017 1112   PLT 284 09/22/2016 0733   MCV 87 07/18/2017 1112   MCH 28.4 07/18/2017 1112   MCH 28.5 09/22/2016 0733   MCHC 32.8 07/18/2017 1112   MCHC 31.4 09/22/2016 0733   RDW 15.3 07/18/2017 1112   LYMPHSABS 1.8 07/18/2017 1112   EOSABS 0.1 07/18/2017 1112   BASOSABS 0.0 07/18/2017 1112   Iron/TIBC/Ferritin/ %Sat No results found for: IRON, TIBC, FERRITIN, IRONPCTSAT Lipid Panel     Component Value Date/Time   CHOL 126 07/18/2017 1112   TRIG 98 07/18/2017 1112   HDL 43 07/18/2017 1112   LDLCALC 63 07/18/2017 1112   Hepatic Function Panel     Component Value Date/Time   PROT 7.7 07/18/2017 1112   ALBUMIN 4.3 07/18/2017 1112   AST 25 07/18/2017 1112  ALT 20 07/18/2017 1112   ALKPHOS 83 07/18/2017 1112   BILITOT 0.3 07/18/2017 1112      Component Value Date/Time   TSH 3.740 07/18/2017 1112   Results for MAKAIYAH, SCHWEIGER (MRN [id number]) as of 10/30/2017 08:19  Ref. Range 07/18/2017 11:12  Vitamin D, 25-Hydroxy Latest Ref Range: 30.0 - 100.0 ng/mL 30.5   ASSESSMENT AND  PLAN: Type 2 diabetes mellitus without complication, with long-term current use of insulin (HCC)  Class 3 severe obesity with serious comorbidity and body mass index (BMI) of 45.0 to 49.9 in adult, unspecified obesity type (HCC)  PLAN:  Diabetes II Libby has been given extensive diabetes education by myself today including ideal fasting and post-prandial blood glucose readings, individual ideal Hgb A1c goals and hypoglycemia prevention. We discussed the importance of good blood sugar control to decrease the likelihood of diabetic complications such as nephropathy, neuropathy, limb loss, blindness, coronary artery disease, and death. We discussed the importance of intensive lifestyle modification including diet, exercise and weight loss as the first line treatment for diabetes. We will recheck labs in 1 month. Deshawna agrees to continue her diabetes medications, diet and exercise and follow up at the agreed upon time.  We spent > than 50% of the 15 minute visit on the counseling as documented in the note.  Obesity Kingsley is currently in the action stage of change. As such, her goal is to continue with weight loss efforts She has agreed to follow the Category 2 plan Janira has been instructed to work up to a goal of 150 minutes of combined cardio and strengthening exercise per week for weight loss and overall health benefits. We discussed the following Behavioral Modification Strategies today: increasing lean protein intake and decreasing simple carbohydrates   Britnie has agreed to follow up with our clinic in 2 to 3 weeks. She was informed of the importance of frequent follow up visits to maximize her success with intensive lifestyle modifications for her multiple health conditions.   OBESITY BEHAVIORAL INTERVENTION VISIT  Today's visit was # 6 out of 22.  Starting weight: 265 lbs Starting date: 07/18/17 Today's weight : 251 lbs Today's date: 10/26/2017 Total lbs lost to date:  14    ASK: We discussed the diagnosis of obesity with Alinda Deem today and Oriyah agreed to give [name] permission to discuss obesity behavioral modification therapy today.  ASSESS: Omayra has the diagnosis of obesity and her BMI today is 47.45 Marra is in the action stage of change   ADVISE: Johnnye was educated on the multiple health risks of obesity as well as the benefit of weight loss to improve her health. She was advised of the need for long term treatment and the importance of lifestyle modifications.  AGREE: Multiple dietary modification options and treatment options were discussed and  Yaretsi agreed to the above obesity treatment plan.  I, [place] Crane, am acting as transcriptionist for Quillian Quince, MD  I have reviewed the above documentation for accuracy and completeness, and I agree with the above. -Quillian Quince, MD

## 2017-10-31 ENCOUNTER — Other Ambulatory Visit (INDEPENDENT_AMBULATORY_CARE_PROVIDER_SITE_OTHER): Payer: Self-pay | Admitting: Orthopaedic Surgery

## 2017-10-31 MED FILL — HYDROCODON-APAP 5-325: 5-325 | 7 days supply | Qty: 30 | Fill #0

## 2017-10-31 MED FILL — METOPROLOL TARTRATE 50 MG T: 50 | 90 days supply | Qty: 180 | Fill #1

## 2017-10-31 MED FILL — HYDROCHLOROTHIAZIDE 12.5 MG: 12.5 | 90 days supply | Qty: 90 | Fill #3

## 2017-10-31 MED FILL — LEVOTHYROXINE 125 MCG TABLE: 125 | 30 days supply | Qty: 30 | Fill #1

## 2017-10-31 MED FILL — METFORMIN HCL ER 500 MG TAB: 500 | 90 days supply | Qty: 180 | Fill #0

## 2017-10-31 NOTE — Telephone Encounter (Signed)
Please advise 

## 2017-11-01 DIAGNOSIS — H5213 Myopia, bilateral: Secondary | ICD-10-CM | POA: Diagnosis not present

## 2017-11-01 DIAGNOSIS — H35373 Puckering of macula, bilateral: Secondary | ICD-10-CM | POA: Diagnosis not present

## 2017-11-01 DIAGNOSIS — E119 Type 2 diabetes mellitus without complications: Secondary | ICD-10-CM | POA: Diagnosis not present

## 2017-11-01 DIAGNOSIS — H524 Presbyopia: Secondary | ICD-10-CM | POA: Diagnosis not present

## 2017-11-03 DIAGNOSIS — M545 Low back pain: Secondary | ICD-10-CM | POA: Diagnosis not present

## 2017-11-09 MED FILL — LANTUS 100 UNITS/ML VIAL: 100 | 90 days supply | Qty: 110 | Fill #0

## 2017-11-09 MED FILL — XARELTO 20 MG TABLET: 20 | 30 days supply | Qty: 30 | Fill #1

## 2017-11-09 MED FILL — VALSARTAN 320 MG TAB: 320 | 30 days supply | Qty: 30 | Fill #9

## 2017-11-16 ENCOUNTER — Ambulatory Visit (INDEPENDENT_AMBULATORY_CARE_PROVIDER_SITE_OTHER): Payer: 59 | Admitting: Family Medicine

## 2017-11-16 VITALS — BP 110/61 | HR 62 | Temp 98.0°F | Ht 61.0 in | Wt 249.0 lb

## 2017-11-16 DIAGNOSIS — E559 Vitamin D deficiency, unspecified: Secondary | ICD-10-CM

## 2017-11-16 DIAGNOSIS — Z794 Long term (current) use of insulin: Secondary | ICD-10-CM | POA: Diagnosis not present

## 2017-11-16 DIAGNOSIS — Z6841 Body Mass Index (BMI) 40.0 and over, adult: Secondary | ICD-10-CM | POA: Diagnosis not present

## 2017-11-16 DIAGNOSIS — E119 Type 2 diabetes mellitus without complications: Secondary | ICD-10-CM | POA: Diagnosis not present

## 2017-11-16 DIAGNOSIS — Z9189 Other specified personal risk factors, not elsewhere classified: Secondary | ICD-10-CM

## 2017-11-17 NOTE — Progress Notes (Signed)
Office: [phone]  /  Fax: [phone]   HPI:   Chief Complaint: OBESITY [name] is here to discuss her progress with her obesity treatment plan. She is on the Category 2 plan and is following her eating plan approximately 75 % of the time. She states she is walking and doing some water exercises for 5 to 60 minutes 2 times per week. [name] continues to do well with weight loss even with increased work stress and stress eating. Her weight is 249 lb (112.9 kg) today and has had a weight loss of 2 pounds over a period of 3 weeks since her last visit. She has lost 16 lbs since starting treatment with [name].  Diabetes II [name] has a diagnosis of diabetes type II. [name] states BGs have been labile ranging between 109 and 165, mostly 120 to 130 and denies any hypoglycemic episodes. Last A1c was 6.8. She is due for labs soon. Hemoglobin A1C Latest Ref Rng & Units 07/18/2017  HGBA1C 4.8 - 5.6 % 6.8(H)  Some recent data might be hidden    She has been working on intensive lifestyle modifications including diet, exercise, and weight loss to help control her blood glucose levels.  Vitamin D deficiency [name] has a diagnosis of vitamin D deficiency. She is currently taking OTC vit D.  At risk for cardiovascular disease [name] is at a higher than average risk for cardiovascular disease due to diabetes and obesity. She currently denies any chest pain.  ALLERGIES: Allergies  Allergen Reactions  . Actos [Pioglitazone Hydrochloride] Swelling and Other (See Comments)    Numbness in lips, HEADACHES SWELLING REACTION UNSPECIFIED   . Avandia [Rosiglitazone] Swelling    Numbness in lips, headaches  SWELLING REACTION UNSPECIFIED   . Tetanus Toxoids Other (See Comments)    Arm swelling, fainted, ?Respiratory distress  (horse serum)  . Food Nausea Only    Eggplant    MEDICATIONS: Current Outpatient Medications on File Prior to Visit  Medication Sig Dispense Refill  . amLODipine (NORVASC) 5 MG  tablet Take 5 mg by mouth every evening.     [name] amoxicillin (AMOXIL) 500 MG capsule as directed. PRIOR TO DENTAL PROCEDURES  0  . atorvastatin (LIPITOR) 40 MG tablet Take 40 mg by mouth every evening.     . cholecalciferol (VITAMIN D) 1000 units tablet Take 1,000 Units by mouth every evening.     . DULoxetine (CYMBALTA) 60 MG capsule Take 60 mg by mouth daily.    . flecainide (TAMBOCOR) 50 MG tablet TAKE 1 TABLET BY MOUTH TWICE A DAY 180 tablet 2  . hydrochlorothiazide (MICROZIDE) 12.5 MG capsule Take 1 capsule by mouth daily.  3  . HYDROcodone-acetaminophen (NORCO/VICODIN) 5-325 MG tablet TAKE 1 TABLET BY MOUTH EVERY 6 HOURS AS NEEDED FOR MODERATE PAIN. 30 tablet 0  . indomethacin (INDOCIN) 50 MG capsule Take 50 mg by mouth 3 (three) times daily as needed (gout).    . insulin glargine (LANTUS) 100 UNIT/ML injection Inject 40 Units into the skin 2 (two) times daily.     . insulin lispro (HUMALOG) 100 UNIT/ML cartridge Inject 24 Units into the skin 3 (three) times daily with meals. UNLESS BLOOD SUGAR IS 100 OR LESS     . ketorolac (TORADOL) 10 MG tablet Take 10 mg by mouth 3 (three) times daily as needed (CHEST WALL PAIN/PLURACY).    [name] levothyroxine (SYNTHROID, LEVOTHROID) 125 MCG tablet Take 125 mcg by mouth daily before breakfast.     . Melatonin 5 MG CAPS  Take 5 mg by mouth at bedtime as needed (sleep).     . metFORMIN (GLUCOPHAGE-XR) 500 MG 24 hr tablet Take 1,000 mg by mouth daily with supper.    . methocarbamol (ROBAXIN) 500 MG tablet TAKE 1 TABLET BY MOUTH EVERY 6 HOURS AS NEEDED FOR MUSCLE SPASMS 60 tablet 0  . metoprolol tartrate (LOPRESSOR) 50 MG tablet Take 1 tablet (50 mg total) by mouth 2 (two) times daily. 180 tablet 2  . omeprazole (PRILOSEC) 20 MG capsule Take 20 mg by mouth every evening.     . TRULICITY 1.5 MG/0.5ML SOPN as directed.  11  . valsartan (DIOVAN) 320 MG tablet Take 1 tablet by mouth daily.  3  . Vitamin D, Ergocalciferol, (DRISDOL) 50000 units CAPS capsule Take 1  capsule (50,000 Units total) by mouth every 7 (seven) days. 4 capsule 0  . XARELTO 20 MG TABS tablet TAKE 1 TABLET BY MOUTH ONCE DAILY WITH SUPPER 30 tablet 9   No current facility-administered medications on file prior to visit.     PAST MEDICAL HISTORY: Past Medical History:  Diagnosis Date  . A-fib (HCC)   . Anemia    hx of  . Arthritis   . Chest pain   . Constipation   . Depression   . Diabetes mellitus ORAL MEDS  . Diabetic retinopathy (HCC)   . Dyspnea    with minimal exertion-deconditioned  . Dyspnea   . Dysrhythmia    a-fib  . Food allergy   . GERD (gastroesophageal reflux disease)    occasional  . Gout   . History of kidney stones   . Hypercholesteremia   . Hyperlipidemia   . Hypertension   . Hypertensive kidney disease   . Hypothyroidism   . Knee pain, right   . 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 Obesity   . OSA on CPAP    uses CPAP nightly  . Osteoarthritis   . Palpitations   . Pinched nerve in neck   . Pleurisy   . Pneumonia    hx of  . PONV (postoperative nausea and vomiting)    for 3-4 days after general anesthesia  . Sciatica   . Swelling of knee joint, right   . Synovitis of knee RIGHT  . Trigger finger, left    left index  . Vitamin D deficiency     PAST SURGICAL HISTORY: Past Surgical History:  Procedure Laterality Date  . CESAREAN SECTION  X3  . KNEE ARTHROSCOPY  04/20/2011   Procedure: ARTHROSCOPY KNEE;  Surgeon: Loanne Drilling, MD;  Location: [facility];  Service: Orthopedics;  Laterality: Right;  WITH SYNOVECTOMY  . LEFT CARPAL TUNNEL / LEFT MIDDLE & RING FINGER TRIGGER RELEASE  08-26-2008  . LEFT SHOULDER ARTHROSCOPY W/ DEBRIDEMENT  09-09-2003  . LEFT SHOULDER ARTHROSCOPY/ LEFT THUMB TRIGGER RELEASE  02-22-2005  . PULLEY RELEASE LEFT LONG FINGER  07-14-2009  . RIGHT CARPAL TUNNEL/ RIGHT THUMB TRIGGER RELEASE'S  11-28-2006  . RIGHT SHOULDER ARTHROSCOPY W/ ROTATOR CUFF REPAIR  01-13-2004  . SHOULDER  ARTHROSCOPY DISTAL CLAVICLE EXCISION AND OPEN ROTATOR CUFF REPAIR  09-07-2004   LEFT  . SHOULDER ARTHROSCOPY W/ ACROMIAL REPAIR  11-29-2005   LEFT  . TONSILLECTOMY AND ADENOIDECTOMY  child  . TOTAL KNEE ARTHROPLASTY  12-14-2009   RIGHT  . TOTAL KNEE ARTHROPLASTY Left 11/05/2012   Procedure: LEFT TOTAL KNEE ARTHROPLASTY;  Surgeon: Loanne Drilling, MD;  Location: [facility];  Service: Orthopedics;  Laterality:  Left;  . TOTAL KNEE REVISION Right 07/29/2016   Procedure: RIGHT KNEE POLY-LINER EXCHANGE;  Surgeon: Kathryne Hitch, MD;  Location: [facility];  Service: Orthopedics;  Laterality: Right;  . TRIGGER FINGER RELEASE Right 02/21/2013   Procedure: RIGHT RING A-1 PULLEY RELEASE    (MINOR PROCEDURE) ;  Surgeon: Wyn Forster., MD;  Location: [facility];  Service: Orthopedics;  Laterality: Right;  . TRIGGER FINGER RELEASE Left 09/22/2016   Procedure: RELEASE TRIGGER FINGER LEFT INDEX FINGER;  Surgeon: Kathryne Hitch, MD;  Location: [facility];  Service: Orthopedics;  Laterality: Left;  . TRIGGER FINGER RELEASE Right 01/20/2017   Procedure: RELEASE TRIGGER FINGER/A-1 PULLEY RIGHT INDEX FINGER;  Surgeon: Marcene Corning, MD;  [facility];  Service: Orthopedics;  Laterality: Right;    SOCIAL HISTORY: Social History   Tobacco Use  . Smoking status: Former Smoker    Years: 5.00    Types: Cigarettes    Last attempt to quit: 04/12/1977    Years since quitting: 40.6  . Smokeless tobacco: Never Used  Substance Use Topics  . Alcohol use: No  . Drug use: No    FAMILY HISTORY: Family History  Problem Relation Age of Onset  . Diabetes Mother   . Hypertension Mother   . Heart attack Mother   . Thyroid disease Mother   . Obesity Mother   . Cancer Father   . Liver disease Father   . Sleep apnea Father   . Alcoholism Father     ROS: Review of Systems  Constitutional: Positive for weight loss.  Cardiovascular: Negative for chest pain.    Gastrointestinal: Negative for nausea and vomiting.    PHYSICAL EXAM: Blood pressure 110/61, pulse 62, temperature 98 F (36.7 C), temperature source Oral, height 5\' 1"  (1.549 m), weight 249 lb (112.9 kg), SpO2 93 %. Body mass index is 47.05 kg/m. Physical Exam  Constitutional: She is oriented to person, place, and time. She appears well-developed and well-nourished.  Cardiovascular: Normal rate.  Pulmonary/Chest: Effort normal.  Musculoskeletal: Normal range of motion.  Neurological: She is oriented to person, place, and time.  Skin: Skin is warm and dry.  Psychiatric: She has a normal mood and affect. Her behavior is normal.  Vitals reviewed.   RECENT LABS AND TESTS: BMET    Component Value Date/Time   NA 140 07/18/2017 1112   K 4.3 07/18/2017 1112   CL 102 07/18/2017 1112   CO2 22 07/18/2017 1112   GLUCOSE 95 07/18/2017 1112   GLUCOSE 214 (H) 01/17/2017 1459   BUN 31 (H) 07/18/2017 1112   CREATININE 1.30 (H) 07/18/2017 1112   CALCIUM 9.9 07/18/2017 1112   GFRNONAA 42 (L) 07/18/2017 1112   GFRAA 49 (L) 07/18/2017 1112   Lab Results  Component Value Date   HGBA1C 6.8 (H) 07/18/2017   HGBA1C 7.7 (H) 07/20/2016   HGBA1C 7.9 09/18/2014   No results found for: INSULIN CBC    Component Value Date/Time   WBC 10.0 07/18/2017 1112   WBC 8.6 09/22/2016 0733   RBC 4.57 07/18/2017 1112   RBC 4.31 09/22/2016 0733   HGB 13.0 07/18/2017 1112   HCT 39.6 07/18/2017 1112   PLT 284 09/22/2016 0733   MCV 87 07/18/2017 1112   MCH 28.4 07/18/2017 1112   MCH 28.5 09/22/2016 0733   MCHC 32.8 07/18/2017 1112   MCHC 31.4 09/22/2016 0733   RDW 15.3 07/18/2017 1112   LYMPHSABS 1.8 07/18/2017 1112   EOSABS  0.1 07/18/2017 1112   BASOSABS 0.0 07/18/2017 1112   Iron/TIBC/Ferritin/ %Sat No results found for: IRON, TIBC, FERRITIN, IRONPCTSAT Lipid Panel     Component Value Date/Time   CHOL 126 07/18/2017 1112   TRIG 98 07/18/2017 1112   HDL 43 07/18/2017 1112   LDLCALC 63  07/18/2017 1112   Hepatic Function Panel     Component Value Date/Time   PROT 7.7 07/18/2017 1112   ALBUMIN 4.3 07/18/2017 1112   AST 25 07/18/2017 1112   ALT 20 07/18/2017 1112   ALKPHOS 83 07/18/2017 1112   BILITOT 0.3 07/18/2017 1112      Component Value Date/Time   TSH 3.740 07/18/2017 1112   Results for LACONDA, BASICH (MRN [id number]) as of 11/17/2017 12:04  Ref. Range 07/18/2017 11:12  Vitamin D, 25-Hydroxy Latest Ref Range: 30.0 - 100.0 ng/mL 30.5   ASSESSMENT AND PLAN: Type 2 diabetes mellitus without complication, with long-term current use of insulin (HCC) - Plan: Comprehensive metabolic panel, Hemoglobin A1c  Vitamin D deficiency - Plan: VITAMIN D 25 Hydroxy (Vit-D Deficiency, Fractures)  At risk for heart disease  Class 3 severe obesity with serious comorbidity and body mass index (BMI) of 45.0 to 49.9 in adult, unspecified obesity type (HCC)  PLAN:  Diabetes II [name] has been given extensive diabetes education by myself today including ideal fasting and post-prandial blood glucose readings, individual ideal Hgb A1c goals  and hypoglycemia prevention. We discussed the importance of good blood sugar control to decrease the likelihood of diabetic complications such as nephropathy, neuropathy, limb loss, blindness, coronary artery disease, and death. We discussed the importance of intensive lifestyle modification including diet, exercise and weight loss as the first line treatment for diabetes. [name] agrees to continue her diabetes medications and will follow up at the agreed upon time. She will have her labs drawn next week.  Cardiovascular risk counseling [name] was given extended (15 minutes) coronary artery disease prevention counseling today. She is 67 y.o. female and has risk factors for heart disease including diabetes and obesity. We discussed intensive lifestyle modifications today with an emphasis on specific weight loss instructions and strategies. Pt was also  informed of the importance of increasing exercise and decreasing saturated fats to help prevent heart disease.  Vitamin D Deficiency [name] was informed that low vitamin D levels contributes to fatigue and are associated with obesity, breast, and colon cancer. She agrees to continue to take OTC Vit D @2 ,000 daily and will follow up for routine testing of vitamin D, at least 2-3 times per year. She was informed of the risk of over-replacement of vitamin D and agrees to not increase her dose unless shediscusses this with [name] first. She is due for labs and will have labs drawn next week.  Obesity [name] is currently in the action stage of change. As such, her goal is to continue with weight loss efforts. She has agreed to follow the Category 2 plan. [name] has been instructed to work up to a goal of 150 minutes of combined cardio and strengthening exercise per week for weight loss and overall health benefits. We discussed the following Behavioral Modification Strategies today: increasing lean protein intake, no skipping meals, and meal planning and cooking strategies.  [name] has agreed to follow up with our clinic in 2 to 3 weeks for a fasting appointment. She was informed of the importance of frequent follow up visits to maximize her success with intensive lifestyle modifications for her multiple health conditions.   OBESITY  BEHAVIORAL INTERVENTION VISIT  Today's visit was # 7  Starting weight: 265 lbs Starting date: 07/18/17 Today's weight : Weight: 249 lb (112.9 kg)  Today's date: 11/16/2017 Total lbs lost to date: 16  ASK: We discussed the diagnosis of obesity with [name] today and [name] agreed to give [name] permission to discuss obesity behavioral modification therapy today.  ASSESS: [name] has the diagnosis of obesity and her BMI today is 47.07 Shantese is in the action stage of change.   ADVISE: Bradlee was educated on the multiple health risks of obesity as well as the benefit of  weight loss to improve her health. She was advised of the need for long term treatment and the importance of lifestyle modifications to improve her current health and to decrease her risk of future health problems.  AGREE: Multiple dietary modification options and treatment options were discussed and Dymond agreed to follow the recommendations documented in the above note.  ARRANGE: Makenzye was educated on the importance of frequent visits to treat obesity as outlined per CMS and USPSTF guidelines and agreed to schedule her next follow up appointment today.  I, Kirke Corin, am acting as transcriptionist for Wilder Glade, MD  I have reviewed the above documentation for accuracy and completeness, and I agree with the above. -Quillian Quince, MD

## 2017-11-20 ENCOUNTER — Ambulatory Visit: Payer: 59 | Attending: Orthopaedic Surgery

## 2017-11-20 ENCOUNTER — Other Ambulatory Visit: Payer: Self-pay

## 2017-11-20 DIAGNOSIS — M25641 Stiffness of right hand, not elsewhere classified: Secondary | ICD-10-CM | POA: Insufficient documentation

## 2017-11-20 DIAGNOSIS — M25572 Pain in left ankle and joints of left foot: Secondary | ICD-10-CM | POA: Diagnosis present

## 2017-11-20 DIAGNOSIS — G8929 Other chronic pain: Secondary | ICD-10-CM | POA: Insufficient documentation

## 2017-11-20 DIAGNOSIS — M5441 Lumbago with sciatica, right side: Secondary | ICD-10-CM | POA: Insufficient documentation

## 2017-11-20 DIAGNOSIS — R600 Localized edema: Secondary | ICD-10-CM | POA: Diagnosis present

## 2017-11-20 DIAGNOSIS — M25561 Pain in right knee: Secondary | ICD-10-CM | POA: Insufficient documentation

## 2017-11-20 DIAGNOSIS — M79644 Pain in right finger(s): Secondary | ICD-10-CM | POA: Diagnosis not present

## 2017-11-20 DIAGNOSIS — R252 Cramp and spasm: Secondary | ICD-10-CM | POA: Insufficient documentation

## 2017-11-20 DIAGNOSIS — R262 Difficulty in walking, not elsewhere classified: Secondary | ICD-10-CM | POA: Insufficient documentation

## 2017-11-20 DIAGNOSIS — M6281 Muscle weakness (generalized): Secondary | ICD-10-CM | POA: Insufficient documentation

## 2017-11-20 DIAGNOSIS — M5442 Lumbago with sciatica, left side: Secondary | ICD-10-CM | POA: Diagnosis not present

## 2017-11-20 DIAGNOSIS — M25571 Pain in right ankle and joints of right foot: Secondary | ICD-10-CM | POA: Diagnosis present

## 2017-11-20 DIAGNOSIS — M25661 Stiffness of right knee, not elsewhere classified: Secondary | ICD-10-CM | POA: Insufficient documentation

## 2017-11-20 NOTE — Therapy (Signed)
[facility] [address], [address] [place], [place], [address] Phone: [phone]   Fax:  [phone]  Physical Therapy Evaluation  Patient Details  Name: [name] MRN: [id number] Date of Birth: January 10, 1951 Referring Provider: Marcene Corning, MD   Encounter Date: 11/20/2017  PT End of Session - 11/20/17 1648    Visit Number  1    Date for PT Re-Evaluation  01/15/18    PT Start Time  1447    PT Stop Time  1525    PT Time Calculation (min)  38 min    Activity Tolerance  Patient tolerated treatment well    Behavior During Therapy  [facility] for tasks assessed/performed       Past Medical History:  Diagnosis Date  . A-fib (HCC)   . Anemia    hx of  . Arthritis   . Chest pain   . Constipation   . Depression   . Diabetes mellitus ORAL [facility]S  . Diabetic retinopathy (HCC)   . Dyspnea    with minimal exertion-deconditioned  . Dyspnea   . Dysrhythmia    a-fib  . Food allergy   . GERD (gastroesophageal reflux disease)    occasional  . Gout   . History of kidney stones   . Hypercholesteremia   . Hyperlipidemia   . Hypertension   . Hypertensive kidney disease   . Hypothyroidism   . Knee pain, right   . 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 Obesity   . OSA on CPAP    uses CPAP nightly  . Osteoarthritis   . Palpitations   . Pinched nerve in neck   . Pleurisy   . Pneumonia    hx of  . PONV (postoperative nausea and vomiting)    for 3-4 days after general anesthesia  . Sciatica   . Swelling of knee joint, right   . Synovitis of knee RIGHT  . Trigger finger, left    left index  . Vitamin D deficiency     Past Surgical History:  Procedure Laterality Date  . CESAREAN SECTION  X3  . KNEE ARTHROSCOPY  04/20/2011   Procedure: ARTHROSCOPY KNEE;  Surgeon: Loanne Drilling, MD;  Location: [facility];  Service: Orthopedics;  Laterality: Right;  WITH SYNOVECTOMY  . LEFT CARPAL TUNNEL / LEFT  MIDDLE & RING FINGER TRIGGER RELEASE  08-26-2008  . LEFT SHOULDER ARTHROSCOPY W/ DEBRIDEMENT  09-09-2003  . LEFT SHOULDER ARTHROSCOPY/ LEFT THUMB TRIGGER RELEASE  02-22-2005  . PULLEY RELEASE LEFT LONG FINGER  07-14-2009  . RIGHT CARPAL TUNNEL/ RIGHT THUMB TRIGGER RELEASE'S  11-28-2006  . RIGHT SHOULDER ARTHROSCOPY W/ ROTATOR CUFF REPAIR  01-13-2004  . SHOULDER ARTHROSCOPY DISTAL CLAVICLE EXCISION AND OPEN ROTATOR CUFF REPAIR  09-07-2004   LEFT  . SHOULDER ARTHROSCOPY W/ ACROMIAL REPAIR  11-29-2005   LEFT  . TONSILLECTOMY AND ADENOIDECTOMY  child  . TOTAL KNEE ARTHROPLASTY  12-14-2009   RIGHT  . TOTAL KNEE ARTHROPLASTY Left 11/05/2012   Procedure: LEFT TOTAL KNEE ARTHROPLASTY;  Surgeon: Loanne Drilling, MD;  Location: [facility];  Service: Orthopedics;  Laterality: Left;  . TOTAL KNEE REVISION Right 07/29/2016   Procedure: RIGHT KNEE POLY-LINER EXCHANGE;  Surgeon: Kathryne Hitch, MD;  Location: [facility];  Service: Orthopedics;  Laterality: Right;  . TRIGGER FINGER RELEASE Right 02/21/2013   Procedure: RIGHT RING A-1 PULLEY RELEASE    (MINOR PROCEDURE) ;  Surgeon: Wyn Forster., MD;  Location: [facility];  Service: Orthopedics;  Laterality: Right;  . TRIGGER FINGER RELEASE Left 09/22/2016   Procedure: RELEASE TRIGGER FINGER LEFT INDEX FINGER;  Surgeon: Kathryne Hitch, MD;  Location: [facility];  Service: Orthopedics;  Laterality: Left;  . TRIGGER FINGER RELEASE Right 01/20/2017   Procedure: RELEASE TRIGGER FINGER/A-1 PULLEY RIGHT INDEX FINGER;  Surgeon: Marcene Corning, MD;  Location: [facility];  Service: Orthopedics;  Laterality: Right;    There were no vitals filed for this visit.   Subjective Assessment - 11/20/17 1450    Subjective  Pt presents to PT with complaints of LBP that began ~1 month ago with lateral hip pain and tingling into bil. legs.  Pt woke up with start pain in the Rt side of low back when trying to get out of bed.       Pertinent History  diabetes, HTN, obesity, widespread OA    Limitations  Sitting;Standing;Walking    How long can you sit comfortably?  15 minutes     How long can you stand comfortably?  15 minutes    How long can you walk comfortably?  10 minutes    Diagnostic tests  x-ray: Spondylosisthesis L4/5    Patient Stated Goals  reduce LBP    Currently in Pain?  Yes    Pain Score  3    1-8/10   Pain Location  Back    Pain Orientation  Right    Pain Descriptors / Indicators  Sharp    Pain Type  Chronic pain    Pain Radiating Towards  bil legs-electrictricity that is intermittent    Pain Onset  More than a month ago    Pain Frequency  Constant    Aggravating Factors   sitting, standing, walking    Pain Relieving Factors  ice/heat, pain medication         [facility] PT Assessment - 11/20/17 0001      Assessment   Medical Diagnosis  L4-L5 Grade 1 spondylosisthesis    Referring Provider  Marcene Corning, MD    Onset Date/Surgical Date  09/19/17    Next MD Visit  12/06/17    Prior Therapy  none      Precautions   Precautions  None      Restrictions   Weight Bearing Restrictions  No      Balance Screen   Has the patient fallen in the past 6 months  No    Has the patient had a decrease in activity level because of a fear of falling?   No    Is the patient reluctant to leave their home because of a fear of falling?   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     Leisure  swimming      Cognition   Overall Cognitive Status  Within Functional Limits for tasks assessed      Observation/Other Assessments   Focus on Therapeutic Outcomes (FOTO)   48% limitation       Posture/Postural Control  Posture/Postural Control  Postural limitations    Postural Limitations  Rounded Shoulders;Forward head;Flexed trunk;Increased thoracic kyphosis      ROM / Strength   AROM / PROM / Strength  AROM;Strength      AROM   Overall AROM   Within functional limits for tasks performed    Overall AROM Comments  Pt with painful lumbar and hip mobility with A/ROM [facility]      Strength   Overall Strength  Within functional limits for tasks performed    Overall Strength Comments  4+/5 LE strength bilaterally.  Poor core recruitment with functional movement.       Palpation   Spinal mobility  reduced PA mobility with pain L1-5    Palpation comment  palpable trigger points and tension over bil lumbar paraspinals and Rt gluteals      Ambulation/Gait   Gait Pattern  Within Functional Limits                Objective measurements completed on examination: See above findings.              PT Education - 11/20/17 1523    Education Details   Access Code: [email or web address]     Person(s) Educated  Patient    Methods  Explanation;Demonstration;Handout    Comprehension  Verbalized understanding;Returned demonstration       PT Short Term Goals - 11/20/17 1458      PT SHORT TERM GOAL #1   Title  be independent in initial HEP    Time  4    Period  Weeks    Status  New    Target Date  12/18/17      PT SHORT TERM GOAL #2   Title  report a 25% reduction in LBP with sitting/standing and walking    Time  4    Period  Weeks    Target Date  12/18/17      PT SHORT TERM GOAL #3   Title  sit for 20 minutes at work without limitaiton due to LBP    Baseline  --    Time  4    Period  Weeks    Status  New    Target Date  12/18/17      PT SHORT TERM GOAL #4   Title  stand and walk for 20-25 minutes without limitation due to LBP    Time  4    Period  Weeks    Status  On-going    Target Date  12/18/17        PT Long Term Goals - 11/20/17 1509      PT LONG TERM  GOAL #1   Title  be independent in advanced HEP    Time  8    Period  Weeks    Status  New    Target Date  01/15/18      PT LONG TERM GOAL #2   Title  reduce FOTO to < or = 39% limitation    Baseline  --    Time  8    Period  Weeks    Status  New    Target Date  01/15/18      PT LONG TERM GOAL #3   Title  tolerate sitting > or = to 25-30 minutes without limitation to improve tolerance for work tasks    Baseline  --    Time  8    Period  Weeks    Status  New  Target Date  01/15/18      PT LONG TERM GOAL #4   Title  verbalize and demonstrate body mechanics modifications and use abdominal bracing for lumbar protection     Baseline  --    Time  8    Period  Weeks    Status  New    Target Date  01/15/18      PT LONG TERM GOAL #5   Title  stand for > or = to 25 minutes for home tasks without limitation    Time  8    Period  Weeks    Status  New    Target Date  01/15/18      Additional Long Term Goals   Additional Long Term Goals  Yes      PT LONG TERM GOAL #6   Title  report a 60% reduction in LBP with ADLs and work tasks    Time  8    Period  Weeks    Status  New             Plan - 11/20/17 1616    Clinical Impression Statement  Pt presents to PT with complaints of onset of LBP with LE pain that began ~2 month ago without cause.  Recent x-ray showed grade 1 L4-5 spondylosisthesis.  Pt reports intermittent bil LE radiculopathy and constant LBP.  Pt rates pain as 3-9/10 and is limited to sitting, standing and walking for 15 minutes.  Pt demonstrates painful lumbar A/ROM and Rt hip motion and all A/ROM is WFLs.  Pt with palpable trigger points over Rt gluteals and bil lumbar paraspinals. Painful and limited mobility L1-5.  Pt with poor core activation with transitional motions.  Pt will benefit from skilled PT for core strength, body mechanics education, manual to address tissue mobility and flexibility to address LBP.      History and Personal Factors relevant to  plan of care:  DM, widespread OA, HTN, depression.    Clinical Presentation  Evolving    Clinical Presentation due to:  variable pain over the past 2 months related to activity    Clinical Decision Making  Moderate    Rehab Potential  Good    PT Frequency  2x / week    PT Duration  8 weeks    PT Treatment/Interventions  ADLs/Self Care Home Management;Cryotherapy;Electrical Stimulation;Ultrasound;Moist Heat;Functional mobility training;Therapeutic activities;Therapeutic exercise;Patient/family education;Neuromuscular re-education;Manual techniques;Passive range of motion;Dry needling;Taping    PT Next Visit Plan  dry needling to lumbar multifidi and Rt gluteals, try e-stim with needles, core activation, body mechanics education, review HEP    PT Home Exercise Plan  Access Code: [email or web address]    Consulted and Agree with Plan of Care  Patient       Patient will benefit from skilled therapeutic intervention in order to improve the following deficits and impairments:  Improper body mechanics, Pain, Increased muscle spasms, Postural dysfunction, Decreased activity tolerance, Decreased strength, Decreased range of motion, Impaired flexibility  Visit Diagnosis: Chronic bilateral low back pain with bilateral sciatica - Plan: PT plan of care cert/re-cert  Muscle weakness (generalized) - Plan: PT plan of care cert/re-cert  Cramp and spasm - Plan: PT plan of care cert/re-cert     Problem List Patient Active Problem List   Diagnosis Date Noted  . Other fatigue 07/18/2017  . Shortness of breath on exertion 07/18/2017  . Type 2 diabetes mellitus with retinopathy, with long-term current use of insulin (HCC) 07/18/2017  . Vitamin D  deficiency 07/18/2017  . B12 nutritional deficiency 07/18/2017  . Other specified hypothyroidism 07/18/2017  . Acute pain of right knee 12/22/2016  . Trigger index finger of left hand 09/22/2016  . 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 Diabetes (HCC) 01/08/2014  . Essential hypertension 01/08/2014  . Hyperlipidemia 01/08/2014  . Postoperative anemia due to acute blood loss 11/22/2012  . Hyponatremia 11/06/2012  . OA (osteoarthritis) of knee 11/05/2012  . Villonodular synovitis of knee 04/20/2011     Lorrene Reid, PT 11/20/17 4:52 PM  [facility] [address] [place], [place], [address] Phone: [phone]   Fax:  [phone]  Name: [name] MRN: [id number] Date of Birth: [birth year]

## 2017-11-20 NOTE — Patient Instructions (Signed)
Access Code: [email or web address]  URL: [email or web address]  Date: 11/20/2017  Prepared by: Lorrene Reid   Exercises  Supine Lower Trunk Rotation - 3 reps - 1 sets - 20 hold - 1x daily - 7x weekly  Hooklying Single Knee to Chest - 3 reps - 1 sets - 20 hold - 3x daily - 7x weekly  Seated Hamstring Stretch - 3 reps - 1 sets - 20 hold - 3x daily - 7x weekly

## 2017-11-22 ENCOUNTER — Ambulatory Visit: Payer: 59 | Admitting: Physical Therapy

## 2017-11-22 ENCOUNTER — Encounter: Payer: Self-pay | Admitting: Physical Therapy

## 2017-11-22 DIAGNOSIS — M25572 Pain in left ankle and joints of left foot: Secondary | ICD-10-CM

## 2017-11-22 DIAGNOSIS — G8929 Other chronic pain: Secondary | ICD-10-CM | POA: Diagnosis not present

## 2017-11-22 DIAGNOSIS — M5441 Lumbago with sciatica, right side: Principal | ICD-10-CM

## 2017-11-22 DIAGNOSIS — M5442 Lumbago with sciatica, left side: Secondary | ICD-10-CM | POA: Diagnosis not present

## 2017-11-22 DIAGNOSIS — M79644 Pain in right finger(s): Secondary | ICD-10-CM | POA: Diagnosis not present

## 2017-11-22 DIAGNOSIS — R262 Difficulty in walking, not elsewhere classified: Secondary | ICD-10-CM

## 2017-11-22 DIAGNOSIS — R252 Cramp and spasm: Secondary | ICD-10-CM

## 2017-11-22 DIAGNOSIS — R600 Localized edema: Secondary | ICD-10-CM

## 2017-11-22 DIAGNOSIS — M6281 Muscle weakness (generalized): Secondary | ICD-10-CM

## 2017-11-22 DIAGNOSIS — M25661 Stiffness of right knee, not elsewhere classified: Secondary | ICD-10-CM

## 2017-11-22 DIAGNOSIS — M25571 Pain in right ankle and joints of right foot: Secondary | ICD-10-CM

## 2017-11-22 DIAGNOSIS — M25641 Stiffness of right hand, not elsewhere classified: Secondary | ICD-10-CM

## 2017-11-22 DIAGNOSIS — M25561 Pain in right knee: Secondary | ICD-10-CM | POA: Diagnosis not present

## 2017-11-22 MED FILL — TRULICITY 1.5 MG/0.5 ML PEN: 1.5 | 28 days supply | Qty: 2 | Fill #10

## 2017-11-22 NOTE — Therapy (Signed)
[facility] [address] [place], [place], [address] Phone: [phone]   Fax:  [phone]  Physical Therapy Treatment  Patient Details  Name: [name] MRN: [id number] Date of Birth: 03/09/1951 Referring Provider: Marcene Corning, MD   Encounter Date: 11/22/2017  PT End of Session - 11/22/17 1528    Visit Number  2    Date for PT Re-Evaluation  01/15/18    PT Start Time  1530    PT Stop Time  1625    PT Time Calculation (min)  55 min    Activity Tolerance  Patient tolerated treatment well    Behavior During Therapy  [facility] for tasks assessed/performed       Past Medical History:  Diagnosis Date  . A-fib (HCC)   . Anemia    hx of  . Arthritis   . Chest pain   . Constipation   . Depression   . Diabetes mellitus [facility]  . Diabetic retinopathy (HCC)   . Dyspnea    with minimal exertion-deconditioned  . Dyspnea   . Dysrhythmia    a-fib  . Food allergy   . GERD (gastroesophageal reflux disease)    occasional  . Gout   . History of kidney stones   . Hypercholesteremia   . Hyperlipidemia   . Hypertension   . Hypertensive kidney disease   . Hypothyroidism   . Knee pain, right   . 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 Obesity   . OSA on CPAP    uses CPAP nightly  . Osteoarthritis   . Palpitations   . Pinched nerve in neck   . Pleurisy   . Pneumonia    hx of  . PONV (postoperative nausea and vomiting)    for 3-4 days after general anesthesia  . Sciatica   . Swelling of knee joint, right   . Synovitis of knee RIGHT  . Trigger finger, left    left index  . Vitamin D deficiency     Past Surgical History:  Procedure Laterality Date  . CESAREAN SECTION  X3  . KNEE ARTHROSCOPY  04/20/2011   Procedure: ARTHROSCOPY KNEE;  Surgeon: Loanne Drilling, MD;  Location: [facility];  Service: Orthopedics;  Laterality: Right;  WITH SYNOVECTOMY  . LEFT CARPAL TUNNEL / LEFT  MIDDLE & RING FINGER TRIGGER RELEASE  08-26-2008  . LEFT SHOULDER ARTHROSCOPY W/ DEBRIDEMENT  09-09-2003  . LEFT SHOULDER ARTHROSCOPY/ LEFT THUMB TRIGGER RELEASE  02-22-2005  . PULLEY RELEASE LEFT LONG FINGER  07-14-2009  . RIGHT CARPAL TUNNEL/ RIGHT THUMB TRIGGER RELEASE'S  11-28-2006  . RIGHT SHOULDER ARTHROSCOPY W/ ROTATOR CUFF REPAIR  01-13-2004  . SHOULDER ARTHROSCOPY DISTAL CLAVICLE EXCISION AND OPEN ROTATOR CUFF REPAIR  09-07-2004   LEFT  . SHOULDER ARTHROSCOPY W/ ACROMIAL REPAIR  11-29-2005   LEFT  . TONSILLECTOMY AND ADENOIDECTOMY  child  . TOTAL KNEE ARTHROPLASTY  12-14-2009   RIGHT  . TOTAL KNEE ARTHROPLASTY Left 11/05/2012   Procedure: LEFT TOTAL KNEE ARTHROPLASTY;  Surgeon: Loanne Drilling, MD;  Location: [facility];  Service: Orthopedics;  Laterality: Left;  . TOTAL KNEE REVISION Right 07/29/2016   Procedure: RIGHT KNEE POLY-LINER EXCHANGE;  Surgeon: Kathryne Hitch, MD;  Location: [facility];  Service: Orthopedics;  Laterality: Right;  . TRIGGER FINGER RELEASE Right 02/21/2013   Procedure: RIGHT RING A-1 PULLEY RELEASE    (MINOR PROCEDURE) ;  Surgeon: Wyn Forster., MD;  Location: [facility];  Service: Orthopedics;  Laterality: Right;  . TRIGGER FINGER RELEASE Left 09/22/2016   Procedure: RELEASE TRIGGER FINGER LEFT INDEX FINGER;  Surgeon: Kathryne Hitch, MD;  Location: [facility];  Service: Orthopedics;  Laterality: Left;  . TRIGGER FINGER RELEASE Right 01/20/2017   Procedure: RELEASE TRIGGER FINGER/A-1 PULLEY RIGHT INDEX FINGER;  Surgeon: Marcene Corning, MD;  Location: [facility];  Service: Orthopedics;  Laterality: Right;    There were no vitals filed for this visit.  Subjective Assessment - 11/22/17 1532    Subjective  Discomfort today.    Pertinent History  diabetes, HTN, obesity, widespread OA    Limitations  Sitting;Standing;Walking    Currently in Pain?  Yes    Pain Score  1     Pain Location  --   back and hip   Pain  Orientation  Left    Pain Descriptors / Indicators  Sore    Multiple Pain Sites  No                       [facility] Adult PT Treatment/Exercise - 11/22/17 0001      Lumbar Exercises: Stretches   Active Hamstring Stretch  Right;Left;2 reps;20 seconds    Single Knee to Chest Stretch  Right;Left;2 reps;20 seconds   pillowcase used VC to use scap muscles    Lower Trunk Rotation  2 reps;20 seconds    Pelvic Tilt  10 reps;5 seconds      Lumbar Exercises: Supine   Clam  5 reps    Clam Limitations  Added to HEP      Programme researcher, broadcasting/film/video Location  LT lumbar    Electrical Stimulation Action  IFC    Electrical Stimulation Parameters  80-150 HZ hooklying    Electrical Stimulation Goals  Pain               PT Short Term Goals - 11/20/17 1458      PT SHORT TERM GOAL #1   Title  be independent in initial HEP    Time  4    Period  Weeks    Status  New    Target Date  12/18/17      PT SHORT TERM GOAL #2   Title  report a 25% reduction in LBP with sitting/standing and walking    Time  4    Period  Weeks    Target Date  12/18/17      PT SHORT TERM GOAL #3   Title  sit for 20 minutes at work without limitaiton due to LBP    Baseline  --    Time  4    Period  Weeks    Status  New    Target Date  12/18/17      PT SHORT TERM GOAL #4   Title  stand and walk for 20-25 minutes without limitation due to LBP    Time  4    Period  Weeks    Status  On-going    Target Date  12/18/17        PT Long Term Goals - 11/20/17 1509      PT LONG TERM GOAL #1   Title  be independent in advanced HEP    Time  8    Period  Weeks    Status  New    Target Date  01/15/18      PT LONG TERM  GOAL #2   Title  reduce FOTO to < or = 39% limitation    Baseline  --    Time  8    Period  Weeks    Status  New    Target Date  01/15/18      PT LONG TERM GOAL #3   Title  tolerate sitting > or = to 25-30 minutes without limitation to improve tolerance  for work tasks    Baseline  --    Time  8    Period  Weeks    Status  New    Target Date  01/15/18      PT LONG TERM GOAL #4   Title  verbalize and demonstrate body mechanics modifications and use abdominal bracing for lumbar protection     Baseline  --    Time  8    Period  Weeks    Status  New    Target Date  01/15/18      PT LONG TERM GOAL #5   Title  stand for > or = to 25 minutes for home tasks without limitation    Time  8    Period  Weeks    Status  New    Target Date  01/15/18      Additional Long Term Goals   Additional Long Term Goals  Yes      PT LONG TERM GOAL #6   Title  report a 60% reduction in LBP with ADLs and work tasks    Time  8    Period  Weeks    Status  New            Plan - 11/22/17 1529    Clinical Impression Statement  Pt is compliant with her initial HEP. Today is first treatment so not much change can be said about pt's pain. Transitional movements tend to be most painful. Practiced more lower abdominal activation thoughout the session, pt has difficulty knowing if she is doing.  Trial of Estim at end of session for pain.     Rehab Potential  Good    PT Frequency  2x / week    PT Duration  8 weeks    PT Treatment/Interventions  ADLs/Self Care Home Management;Cryotherapy;Electrical Stimulation;Ultrasound;Moist Heat;Functional mobility training;Therapeutic activities;Therapeutic exercise;Patient/family education;Neuromuscular re-education;Manual techniques;Passive range of motion;Dry needling;Taping    PT Next Visit Plan  dry needling to lumbar multifidi and Rt gluteals, try e-stim with needles, core activation, review new exs given today    PT Home Exercise Plan  Access Code: [email or web address]    Consulted and Agree with Plan of Care  Patient       Patient will benefit from skilled therapeutic intervention in order to improve the following deficits and impairments:  Improper body mechanics, Pain, Increased muscle spasms, Postural dysfunction,  Decreased activity tolerance, Decreased strength, Decreased range of motion, Impaired flexibility  Visit Diagnosis: Chronic bilateral low back pain with bilateral sciatica  Muscle weakness (generalized)  Cramp and spasm  Pain in right finger(s)  Stiffness of right hand, not elsewhere classified  Difficulty in walking, not elsewhere classified  Acute pain of right knee  Stiffness of right knee, not elsewhere classified  Localized edema  Pain in right ankle and joints of right foot  Pain in left ankle and joints of left foot     Problem List Patient Active Problem List   Diagnosis Date Noted  . Other fatigue 07/18/2017  . Shortness of breath on exertion 07/18/2017  .  Type 2 diabetes mellitus with retinopathy, with long-term current use of insulin (HCC) 07/18/2017  . Vitamin D deficiency 07/18/2017  . B12 nutritional deficiency 07/18/2017  . Other specified hypothyroidism 07/18/2017  . Acute pain of right knee 12/22/2016  . Trigger index finger of left hand 09/22/2016  . 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 Diabetes (HCC) 01/08/2014  . Essential hypertension 01/08/2014  . Hyperlipidemia 01/08/2014  . Postoperative anemia due to acute blood loss 11/22/2012  . Hyponatremia 11/06/2012  . OA (osteoarthritis) of knee 11/05/2012  . Villonodular synovitis of knee 04/20/2011    Tiffine Henigan, PTA 11/22/2017, 4:10 PM  [facility] [address] [place], [place], [address] Phone: [phone]   Fax:  [phone]  Name: VIHANA KYDD MRN: [id number] Date of Birth: 06/24/1950  Access Code: [email or web address]  URL: [email or web address]  Date: 11/22/2017  Prepared by: Ane Payment   Exercises  Supine Lower Trunk Rotation - 3 reps - 1 sets -  20 hold - 1x daily - 7x weekly  Hooklying Single Knee to Chest - 3 reps - 1 sets - 20 hold - 3x daily - 7x weekly  Seated Hamstring Stretch - 3 reps - 1 sets - 20 hold - 3x daily - 7x weekly  Supine Posterior Pelvic Tilt - 10 reps - 1 sets - 10 hold - 3x daily - 7x weekly  Access Code: [email or web address]  URL: [email or web address]  Date: 11/22/2017  Prepared by: Ane Payment   Exercises  Supine Lower Trunk Rotation - 3 reps - 1 sets - 20 hold - 1x daily - 7x weekly  Hooklying Single Knee to Chest - 3 reps - 1 sets - 20 hold - 3x daily - 7x weekly  Seated Hamstring Stretch - 3 reps - 1 sets - 20 hold - 3x daily - 7x weekly  Supine Posterior Pelvic Tilt - 10 reps - 1 sets - 10 hold - 3x daily - 7x weekly  Bent Knee Fallouts - 10 reps - 3 sets - 1x daily - 7x weekly

## 2017-11-27 ENCOUNTER — Encounter: Payer: Self-pay | Admitting: Physical Therapy

## 2017-11-27 ENCOUNTER — Ambulatory Visit: Payer: 59 | Admitting: Physical Therapy

## 2017-11-27 DIAGNOSIS — M6281 Muscle weakness (generalized): Secondary | ICD-10-CM | POA: Diagnosis not present

## 2017-11-27 DIAGNOSIS — R262 Difficulty in walking, not elsewhere classified: Secondary | ICD-10-CM | POA: Diagnosis not present

## 2017-11-27 DIAGNOSIS — M79644 Pain in right finger(s): Secondary | ICD-10-CM

## 2017-11-27 DIAGNOSIS — M5442 Lumbago with sciatica, left side: Principal | ICD-10-CM

## 2017-11-27 DIAGNOSIS — M25641 Stiffness of right hand, not elsewhere classified: Secondary | ICD-10-CM

## 2017-11-27 DIAGNOSIS — M5441 Lumbago with sciatica, right side: Secondary | ICD-10-CM | POA: Diagnosis not present

## 2017-11-27 DIAGNOSIS — M25561 Pain in right knee: Secondary | ICD-10-CM | POA: Diagnosis not present

## 2017-11-27 DIAGNOSIS — R252 Cramp and spasm: Secondary | ICD-10-CM | POA: Diagnosis not present

## 2017-11-27 DIAGNOSIS — G8929 Other chronic pain: Secondary | ICD-10-CM

## 2017-11-27 NOTE — Therapy (Signed)
[facility] [address] [place], [place], [address] Phone: [phone]   Fax:  [phone]  Physical Therapy Treatment  Patient Details  Name: [name] MRN: [id number] Date of Birth: 01-22-1951 Referring Provider: Marcene Corning, MD   Encounter Date: 11/27/2017  PT End of Session - 11/27/17 0934    Visit Number  3    Date for PT Re-Evaluation  01/15/18    PT Start Time  0930    PT Stop Time  1030    PT Time Calculation (min)  60 min    Activity Tolerance  Patient tolerated treatment well    Behavior During Therapy  [facility] for tasks assessed/performed       Past Medical History:  Diagnosis Date  . A-fib (HCC)   . Anemia    hx of  . Arthritis   . Chest pain   . Constipation   . Depression   . Diabetes mellitus ORAL MEDS  . Diabetic retinopathy (HCC)   . Dyspnea    with minimal exertion-deconditioned  . Dyspnea   . Dysrhythmia    a-fib  . Food allergy   . GERD (gastroesophageal reflux disease)    occasional  . Gout   . History of kidney stones   . Hypercholesteremia   . Hyperlipidemia   . Hypertension   . Hypertensive kidney disease   . Hypothyroidism   . Knee pain, right   . 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 Obesity   . OSA on CPAP    uses CPAP nightly  . Osteoarthritis   . Palpitations   . Pinched nerve in neck   . Pleurisy   . Pneumonia    hx of  . PONV (postoperative nausea and vomiting)    for 3-4 days after general anesthesia  . Sciatica   . Swelling of knee joint, right   . Synovitis of knee RIGHT  . Trigger finger, left    left index  . Vitamin D deficiency     Past Surgical History:  Procedure Laterality Date  . CESAREAN SECTION  X3  . KNEE ARTHROSCOPY  04/20/2011   Procedure: ARTHROSCOPY KNEE;  Surgeon: Loanne Drilling, MD;  Location: [facility];  Service: Orthopedics;  Laterality: Right;  WITH SYNOVECTOMY  . LEFT CARPAL TUNNEL / LEFT  MIDDLE & RING FINGER TRIGGER RELEASE  08-26-2008  . LEFT SHOULDER ARTHROSCOPY W/ DEBRIDEMENT  09-09-2003  . LEFT SHOULDER ARTHROSCOPY/ LEFT THUMB TRIGGER RELEASE  02-22-2005  . PULLEY RELEASE LEFT LONG FINGER  07-14-2009  . RIGHT CARPAL TUNNEL/ RIGHT THUMB TRIGGER RELEASE'S  11-28-2006  . RIGHT SHOULDER ARTHROSCOPY W/ ROTATOR CUFF REPAIR  01-13-2004  . SHOULDER ARTHROSCOPY DISTAL CLAVICLE EXCISION AND OPEN ROTATOR CUFF REPAIR  09-07-2004   LEFT  . SHOULDER ARTHROSCOPY W/ ACROMIAL REPAIR  11-29-2005   LEFT  . TONSILLECTOMY AND ADENOIDECTOMY  child  . TOTAL KNEE ARTHROPLASTY  12-14-2009   RIGHT  . TOTAL KNEE ARTHROPLASTY Left 11/05/2012   Procedure: LEFT TOTAL KNEE ARTHROPLASTY;  Surgeon: Loanne Drilling, MD;  Location: [facility];  Service: Orthopedics;  Laterality: Left;  . TOTAL KNEE REVISION Right 07/29/2016   Procedure: RIGHT KNEE POLY-LINER EXCHANGE;  Surgeon: Kathryne Hitch, MD;  Location: [facility];  Service: Orthopedics;  Laterality: Right;  . TRIGGER FINGER RELEASE Right 02/21/2013   Procedure: RIGHT RING A-1 PULLEY RELEASE    (MINOR PROCEDURE) ;  Surgeon: Wyn Forster., MD;  Location: [facility];  Service: Orthopedics;  Laterality: Right;  . TRIGGER FINGER RELEASE Left 09/22/2016   Procedure: RELEASE TRIGGER FINGER LEFT INDEX FINGER;  Surgeon: Kathryne HitchBlackman, Christopher Y, MD;  Location: [facility];  Service: Orthopedics;  Laterality: Left;  . TRIGGER FINGER RELEASE Right 01/20/2017   Procedure: RELEASE TRIGGER FINGER/A-1 PULLEY RIGHT INDEX FINGER;  Surgeon: Marcene Corningalldorf, Peter, MD;  Location: [facility];  Service: Orthopedics;  Laterality: Right;    There were no vitals filed for this visit.  Subjective Assessment - 11/27/17 0936    Subjective  The TENS helped calmed things down.     Currently in Pain?  Yes    Pain Score  1     Pain Location  --   back/hip area   Pain Orientation  Left    Pain Descriptors / Indicators  Sore    Aggravating Factors    sitting, walking, standing    Pain Relieving Factors  ice/heat    Multiple Pain Sites  No                       [facility] Adult PT Treatment/Exercise - 11/27/17 0001      Lumbar Exercises: Stretches   Active Hamstring Stretch  Right;Left;2 reps;20 seconds    Lower Trunk Rotation  2 reps;20 seconds    Pelvic Tilt  10 reps;5 seconds      Lumbar Exercises: Aerobic   Nustep  L2 x 10 min   PTA present for concurrent review of status     Lumbar Exercises: Supine   Ab Set  --   Cocontraction TA, ball squeeze, glutes 5 sec hold supine/sit   Clam  20 reps   red band     Electrical Stimulation   Electrical Stimulation Location  LT lumbar    Electrical Stimulation Action  IFC    Electrical Stimulation Parameters  80-150 HZ    Electrical Stimulation Goals  Pain               PT Short Term Goals - 11/27/17 0957      PT SHORT TERM GOAL #1   Title  be independent in initial HEP    Time  4    Period  Weeks    Status  Achieved        PT Long Term Goals - 11/20/17 1509      PT LONG TERM GOAL #1   Title  be independent in advanced HEP    Time  8    Period  Weeks    Status  New    Target Date  01/15/18      PT LONG TERM GOAL #2   Title  reduce FOTO to < or = 39% limitation    Baseline  --    Time  8    Period  Weeks    Status  New    Target Date  01/15/18      PT LONG TERM GOAL #3   Title  tolerate sitting > or = to 25-30 minutes without limitation to improve tolerance for work tasks    Baseline  --    Time  8    Period  Weeks    Status  New    Target Date  01/15/18      PT LONG TERM GOAL #4   Title  verbalize and demonstrate body mechanics modifications and use abdominal bracing for lumbar protection     Baseline  --  Time  8    Period  Weeks    Status  New    Target Date  01/15/18      PT LONG TERM GOAL #5   Title  stand for > or = to 25 minutes for home tasks without limitation    Time  8    Period  Weeks    Status  New    Target  Date  01/15/18      Additional Long Term Goals   Additional Long Term Goals  Yes      PT LONG TERM GOAL #6   Title  report a 60% reduction in LBP with ADLs and work tasks    Time  8    Period  Weeks    Status  New            Plan - 11/27/17 0934    Clinical Impression Statement  Pt reports feeling "better" after her first treatment. She felt the EStim helped to calm things down with reports of a more centralized pain in her lower lumbosacal area.  Pt is independent in her current HEP.  Today we began more strengthening exercises that focused on her core mainly. Still low awareness of contracting her lower abdominals.       Rehab Potential  Good    PT Frequency  2x / week    PT Duration  8 weeks    PT Treatment/Interventions  ADLs/Self Care Home Management;Cryotherapy;Electrical Stimulation;Ultrasound;Moist Heat;Functional mobility training;Therapeutic activities;Therapeutic exercise;Patient/family education;Neuromuscular re-education;Manual techniques;Passive range of motion;Dry needling;Taping    PT Next Visit Plan  dry needling to lumbar multifidi and Rt gluteals, try e-stim with needles, core activation exercises.     PT Home Exercise Plan  Access Code: [email or web address]    Consulted and Agree with Plan of Care  Patient       Patient will benefit from skilled therapeutic intervention in order to improve the following deficits and impairments:  Improper body mechanics, Pain, Increased muscle spasms, Postural dysfunction, Decreased activity tolerance, Decreased strength, Decreased range of motion, Impaired flexibility  Visit Diagnosis: Chronic bilateral low back pain with bilateral sciatica  Muscle weakness (generalized)  Cramp and spasm  Pain in right finger(s)  Stiffness of right hand, not elsewhere classified  Difficulty in walking, not elsewhere classified     Problem List Patient Active Problem List   Diagnosis Date Noted  . Other fatigue 07/18/2017  . Shortness of  breath on exertion 07/18/2017  . Type 2 diabetes mellitus with retinopathy, with long-term current use of insulin (HCC) 07/18/2017  . Vitamin D deficiency 07/18/2017  . B12 nutritional deficiency 07/18/2017  . Other specified hypothyroidism 07/18/2017  . Acute pain of right knee 12/22/2016  . Trigger index finger of left hand 09/22/2016  . 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 Diabetes (HCC) 01/08/2014  . Essential hypertension 01/08/2014  . Hyperlipidemia 01/08/2014  . Postoperative anemia due to acute blood loss 11/22/2012  . Hyponatremia 11/06/2012  . OA (osteoarthritis) of knee 11/05/2012  . Villonodular synovitis of knee 04/20/2011    Vesper Trant, PTA 11/27/2017, 10:27 AM  [facility] [address], [address] [place], [place], [address] Phone: [phone]   Fax:  [phone]  Name: [name] MRN: [id number] Date of Birth: [birth year]

## 2017-11-28 DIAGNOSIS — Z794 Long term (current) use of insulin: Secondary | ICD-10-CM | POA: Diagnosis not present

## 2017-11-28 DIAGNOSIS — E559 Vitamin D deficiency, unspecified: Secondary | ICD-10-CM | POA: Diagnosis not present

## 2017-11-28 DIAGNOSIS — E119 Type 2 diabetes mellitus without complications: Secondary | ICD-10-CM | POA: Diagnosis not present

## 2017-11-29 ENCOUNTER — Encounter: Payer: Self-pay | Admitting: Physical Therapy

## 2017-11-29 ENCOUNTER — Ambulatory Visit: Payer: 59 | Admitting: Physical Therapy

## 2017-11-29 DIAGNOSIS — M25561 Pain in right knee: Secondary | ICD-10-CM | POA: Diagnosis not present

## 2017-11-29 DIAGNOSIS — R252 Cramp and spasm: Secondary | ICD-10-CM | POA: Diagnosis not present

## 2017-11-29 DIAGNOSIS — R262 Difficulty in walking, not elsewhere classified: Secondary | ICD-10-CM | POA: Diagnosis not present

## 2017-11-29 DIAGNOSIS — M25641 Stiffness of right hand, not elsewhere classified: Secondary | ICD-10-CM | POA: Diagnosis not present

## 2017-11-29 DIAGNOSIS — M6281 Muscle weakness (generalized): Secondary | ICD-10-CM

## 2017-11-29 DIAGNOSIS — M5441 Lumbago with sciatica, right side: Secondary | ICD-10-CM | POA: Diagnosis not present

## 2017-11-29 DIAGNOSIS — M5442 Lumbago with sciatica, left side: Principal | ICD-10-CM

## 2017-11-29 DIAGNOSIS — M79644 Pain in right finger(s): Secondary | ICD-10-CM

## 2017-11-29 DIAGNOSIS — G8929 Other chronic pain: Secondary | ICD-10-CM | POA: Diagnosis not present

## 2017-11-29 LAB — COMPREHENSIVE METABOLIC PANEL
ALT: 24 IU/L (ref 0–32)
AST: 25 IU/L (ref 0–40)
Albumin/Globulin Ratio: 1.3 (ref 1.2–2.2)
Albumin: 3.7 g/dL (ref 3.6–4.8)
Alkaline Phosphatase: 74 IU/L (ref 39–117)
BUN/Creatinine Ratio: 22 (ref 12–28)
BUN: 25 mg/dL (ref 8–27)
Bilirubin Total: 0.3 mg/dL (ref 0.0–1.2)
CALCIUM: 8.6 mg/dL — AB (ref 8.7–10.3)
CO2: 19 mmol/L — AB (ref 20–29)
CREATININE: 1.12 mg/dL — AB (ref 0.57–1.00)
Chloride: 106 mmol/L (ref 96–106)
GFR, EST AFRICAN AMERICAN: 58 mL/min/{1.73_m2} — AB (ref >59)
GFR, EST NON AFRICAN AMERICAN: 51 mL/min/{1.73_m2} — AB (ref >59)
GLUCOSE: 91 mg/dL (ref 65–99)
Globulin, Total: 2.8 g/dL (ref 1.5–4.5)
Potassium: 4.3 mmol/L (ref 3.5–5.2)
Sodium: 142 mmol/L (ref 134–144)
TOTAL PROTEIN: 6.5 g/dL (ref 6.0–8.5)

## 2017-11-29 LAB — VITAMIN D 25 HYDROXY (VIT D DEFICIENCY, FRACTURES): VIT D 25 HYDROXY: 31.4 ng/mL (ref 30.0–100.0)

## 2017-11-29 LAB — HEMOGLOBIN A1C
ESTIMATED AVERAGE GLUCOSE: 166 mg/dL
Hgb A1c MFr Bld: 7.4 % — ABNORMAL HIGH (ref 4.8–5.6)

## 2017-11-29 NOTE — Therapy (Signed)
[facility] [address] [place], [place], [address] Phone: [phone]   Fax:  [phone]  Physical Therapy Treatment  Patient Details  Name: [name] MRN: [id number] Date of Birth: 1950/04/12 Referring Provider: Marcene Corning, MD   Encounter Date: 11/29/2017  PT End of Session - 11/29/17 1103    Visit Number  4    Date for PT Re-Evaluation  01/15/18    PT Start Time  1100    PT Stop Time  1200    PT Time Calculation (min)  60 min    Activity Tolerance  Patient tolerated treatment well    Behavior During Therapy  [facility] for tasks assessed/performed       Past Medical History:  Diagnosis Date  . A-fib (HCC)   . Anemia    hx of  . Arthritis   . Chest pain   . Constipation   . Depression   . Diabetes mellitus [facility]AL [facility]  . Diabetic retinopathy (HCC)   . Dyspnea    with minimal exertion-deconditioned  . Dyspnea   . Dysrhythmia    a-fib  . Food allergy   . GERD (gastroesophageal reflux disease)    occasional  . Gout   . History of kidney stones   . Hypercholesteremia   . Hyperlipidemia   . Hypertension   . Hypertensive kidney disease   . Hypothyroidism   . Knee pain, right   . 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 Obesity   . OSA on CPAP    uses CPAP nightly  . Osteoarthritis   . Palpitations   . Pinched nerve in neck   . Pleurisy   . Pneumonia    hx of  . PONV (postoperative nausea and vomiting)    for 3-4 days after general anesthesia  . Sciatica   . Swelling of knee joint, right   . Synovitis of knee RIGHT  . Trigger finger, left    left index  . Vitamin D deficiency     Past Surgical History:  Procedure Laterality Date  . CESAREAN SECTION  X3  . KNEE ARTHROSCOPY  04/20/2011   Procedure: ARTHROSCOPY KNEE;  Surgeon: Loanne Drilling, MD;  Location: [facility];  Service: Orthopedics;  Laterality: Right;  WITH SYNOVECTOMY  . LEFT CARPAL TUNNEL / LEFT  MIDDLE & RING FINGER TRIGGER RELEASE  08-26-2008  . LEFT SHOULDER ARTHROSCOPY W/ DEBRIDEMENT  09-09-2003  . LEFT SHOULDER ARTHROSCOPY/ LEFT THUMB TRIGGER RELEASE  02-22-2005  . PULLEY RELEASE LEFT LONG FINGER  07-14-2009  . RIGHT CARPAL TUNNEL/ RIGHT THUMB TRIGGER RELEASE'S  11-28-2006  . RIGHT SHOULDER ARTHROSCOPY W/ ROTATOR CUFF REPAIR  01-13-2004  . SHOULDER ARTHROSCOPY DISTAL CLAVICLE EXCISION AND OPEN ROTATOR CUFF REPAIR  09-07-2004   LEFT  . SHOULDER ARTHROSCOPY W/ ACROMIAL REPAIR  11-29-2005   LEFT  . TONSILLECTOMY AND ADENOIDECTOMY  child  . TOTAL KNEE ARTHROPLASTY  12-14-2009   RIGHT  . TOTAL KNEE ARTHROPLASTY Left 11/05/2012   Procedure: LEFT TOTAL KNEE ARTHROPLASTY;  Surgeon: Loanne Drilling, MD;  Location: [facility];  Service: Orthopedics;  Laterality: Left;  . TOTAL KNEE REVISION Right 07/29/2016   Procedure: RIGHT KNEE POLY-LINER EXCHANGE;  Surgeon: Kathryne Hitch, MD;  Location: [facility];  Service: Orthopedics;  Laterality: Right;  . TRIGGER FINGER RELEASE Right 02/21/2013   Procedure: RIGHT RING A-1 PULLEY RELEASE    (MINOR PROCEDURE) ;  Surgeon: Wyn Forster., MD;  Location: [facility];  Service: Orthopedics;  Laterality: Right;  . TRIGGER FINGER RELEASE Left 09/22/2016   Procedure: RELEASE TRIGGER FINGER LEFT INDEX FINGER;  Surgeon: Kathryne Hitch, MD;  Location: [facility];  Service: Orthopedics;  Laterality: Left;  . TRIGGER FINGER RELEASE Right 01/20/2017   Procedure: RELEASE TRIGGER FINGER/A-1 PULLEY RIGHT INDEX FINGER;  Surgeon: Marcene Corning, MD;  Location: [facility];  Service: Orthopedics;  Laterality: Right;    There were no vitals filed for this visit.  Subjective Assessment - 11/29/17 1104    Subjective  I did not feel as good as I did after the first session. My "zingers" are less.    Pertinent History  diabetes, HTN, obesity, widespread OA    Limitations  Sitting;Standing;Walking    Diagnostic tests  x-ray:  Spondylosisthesis L4/5    Patient Stated Goals  reduce LBP    Currently in Pain?  Yes    Pain Score  1     Pain Location  Back    Pain Orientation  Left    Pain Descriptors / Indicators  Dull;Aching    Multiple Pain Sites  No                       [facility] Adult PT Treatment/Exercise - 11/29/17 0001      Lumbar Exercises: Stretches   Active Hamstring Stretch  Right;Left;3 reps;20 seconds    Single Knee to Chest Stretch  Right;Left;2 reps;20 seconds   pillowcase used VC to use scap muscles    Lower Trunk Rotation  3 reps;20 seconds    Pelvic Tilt  10 reps;5 seconds      Lumbar Exercises: Aerobic   Nustep  L2 x 10 min   PTA present for concurrent review of status     Lumbar Exercises: Supine   Ab Set  10 reps   Cocontraction TA, ball squeeze, glutes 5 sec hold supine/sit   Clam  20 reps   red band, 2x 10     Moist Heat Therapy   Number Minutes Moist Heat  15 Minutes    Moist Heat Location  Lumbar Spine      Electrical Stimulation   Electrical Stimulation Location  LT lumbar    Electrical Stimulation Action  IFC    Electrical Stimulation Parameters  80-150 Hz    Electrical Stimulation Goals  Pain               PT Short Term Goals - 11/27/17 0957      PT SHORT TERM GOAL #1   Title  be independent in initial HEP    Time  4    Period  Weeks    Status  Achieved        PT Long Term Goals - 11/20/17 1509      PT LONG TERM GOAL #1   Title  be independent in advanced HEP    Time  8    Period  Weeks    Status  New    Target Date  01/15/18      PT LONG TERM GOAL #2   Title  reduce FOTO to < or = 39% limitation    Baseline  --    Time  8    Period  Weeks    Status  New    Target Date  01/15/18      PT LONG TERM GOAL #3   Title  tolerate sitting > or =  to 25-30 minutes without limitation to improve tolerance for work tasks    Baseline  --    Time  8    Period  Weeks    Status  New    Target Date  01/15/18      PT LONG TERM GOAL #4    Title  verbalize and demonstrate body mechanics modifications and use abdominal bracing for lumbar protection     Baseline  --    Time  8    Period  Weeks    Status  New    Target Date  01/15/18      PT LONG TERM GOAL #5   Title  stand for > or = to 25 minutes for home tasks without limitation    Time  8    Period  Weeks    Status  New    Target Date  01/15/18      Additional Long Term Goals   Additional Long Term Goals  Yes      PT LONG TERM GOAL #6   Title  report a 60% reduction in LBP with ADLs and work tasks    Time  8    Period  Weeks    Status  New            Plan - 11/29/17 1103    Clinical Impression Statement  Pt reporting very low level pain this AM. She also reports her "zingers" are 75% less at this time. She did experience muscular pain after last session so progressions were minor today, a few longer stretches, a few more reps. pt performed supine exs on heat which mad eher back feel good.     Rehab Potential  Good    PT Frequency  2x / week    PT Duration  8 weeks    PT Treatment/Interventions  ADLs/Self Care Home Management;Cryotherapy;Electrical Stimulation;Ultrasound;Moist Heat;Functional mobility training;Therapeutic activities;Therapeutic exercise;Patient/family education;Neuromuscular re-education;Manual techniques;Passive range of motion;Dry needling;Taping    PT Next Visit Plan  dry needling to lumbar multifidi and Rt gluteals, try e-stim with needles, core activation exercises.     PT Home Exercise Plan  Access Code: [email or web address]    Consulted and Agree with Plan of Care  Patient       Patient will benefit from skilled therapeutic intervention in order to improve the following deficits and impairments:  Improper body mechanics, Pain, Increased muscle spasms, Postural dysfunction, Decreased activity tolerance, Decreased strength, Decreased range of motion, Impaired flexibility  Visit Diagnosis: Chronic bilateral low back pain with bilateral  sciatica  Muscle weakness (generalized)  Cramp and spasm  Pain in right finger(s)  Stiffness of right hand, not elsewhere classified  Difficulty in walking, not elsewhere classified     Problem List Patient Active Problem List   Diagnosis Date Noted  . Other fatigue 07/18/2017  . Shortness of breath on exertion 07/18/2017  . Type 2 diabetes mellitus with retinopathy, with long-term current use of insulin (HCC) 07/18/2017  . Vitamin D deficiency 07/18/2017  . B12 nutritional deficiency 07/18/2017  . Other specified hypothyroidism 07/18/2017  . Acute pain of right knee 12/22/2016  . Trigger index finger of left hand 09/22/2016  . 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 Diabetes (HCC) 01/08/2014  . Essential hypertension 01/08/2014  . Hyperlipidemia 01/08/2014  . Postoperative anemia due to acute blood loss 11/22/2012  . Hyponatremia 11/06/2012  .  OA (osteoarthritis) of knee 11/05/2012  . Villonodular synovitis of knee 04/20/2011    [name], PTA 11/29/2017, 11:43 AM  [facility] [address] [place], [place], [address] Phone: [phone]   Fax:  [phone]  Name: [name] MRN: [id number] Date of Birth: 01/02/1951

## 2017-12-04 ENCOUNTER — Ambulatory Visit: Payer: 59 | Admitting: Physical Therapy

## 2017-12-04 DIAGNOSIS — M5442 Lumbago with sciatica, left side: Principal | ICD-10-CM

## 2017-12-04 DIAGNOSIS — M6281 Muscle weakness (generalized): Secondary | ICD-10-CM | POA: Diagnosis not present

## 2017-12-04 DIAGNOSIS — G8929 Other chronic pain: Secondary | ICD-10-CM

## 2017-12-04 DIAGNOSIS — R252 Cramp and spasm: Secondary | ICD-10-CM | POA: Diagnosis not present

## 2017-12-04 DIAGNOSIS — R262 Difficulty in walking, not elsewhere classified: Secondary | ICD-10-CM | POA: Diagnosis not present

## 2017-12-04 DIAGNOSIS — M5441 Lumbago with sciatica, right side: Secondary | ICD-10-CM | POA: Diagnosis not present

## 2017-12-04 DIAGNOSIS — M25641 Stiffness of right hand, not elsewhere classified: Secondary | ICD-10-CM | POA: Diagnosis not present

## 2017-12-04 DIAGNOSIS — M25561 Pain in right knee: Secondary | ICD-10-CM | POA: Diagnosis not present

## 2017-12-04 DIAGNOSIS — M79644 Pain in right finger(s): Secondary | ICD-10-CM | POA: Diagnosis not present

## 2017-12-04 MED FILL — DULoxetine HCL 60 MG CPEP: 60 | 30 days supply | Qty: 30 | Fill #4

## 2017-12-04 MED FILL — AMLODIPINE BESYLATE 5 MG TA: 5 | 90 days supply | Qty: 90 | Fill #2

## 2017-12-04 NOTE — Therapy (Signed)
[facility] [address], [address] [place], [place], [address] Phone: [phone]   Fax:  [phone]  Physical Therapy Treatment  Patient Details  Name: [name] MRN: [id number] Date of Birth: 1950-06-25 Referring Provider: Marcene Corningalldorf, Peter, MD   Encounter Date: 12/04/2017  PT End of Session - 12/04/17 1539    Visit Number  5    Date for PT Re-Evaluation  01/15/18    PT Start Time  1520    PT Stop Time  1621    PT Time Calculation (min)  61 min    Activity Tolerance  Patient tolerated treatment well    Behavior During Therapy  [facility] for tasks assessed/performed       Past Medical History:  Diagnosis Date  . A-fib (HCC)   . Anemia    hx of  . Arthritis   . Chest pain   . Constipation   . Depression   . Diabetes mellitus ORAL MEDS  . Diabetic retinopathy (HCC)   . Dyspnea    with minimal exertion-deconditioned  . Dyspnea   . Dysrhythmia    a-fib  . Food allergy   . GERD (gastroesophageal reflux disease)    occasional  . Gout   . History of kidney stones   . Hypercholesteremia   . Hyperlipidemia   . Hypertension   . Hypertensive kidney disease   . Hypothyroidism   . Knee pain, right   . 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 Obesity   . OSA on CPAP    uses CPAP nightly  . Osteoarthritis   . Palpitations   . Pinched nerve in neck   . Pleurisy   . Pneumonia    hx of  . PONV (postoperative nausea and vomiting)    for 3-4 days after general anesthesia  . Sciatica   . Swelling of knee joint, right   . Synovitis of knee RIGHT  . Trigger finger, left    left index  . Vitamin D deficiency     Past Surgical History:  Procedure Laterality Date  . CESAREAN SECTION  X3  . KNEE ARTHROSCOPY  04/20/2011   Procedure: ARTHROSCOPY KNEE;  Surgeon: Loanne DrillingFrank V Aluisio, MD;  [facility];  Service: Orthopedics;  Laterality: Right;  WITH SYNOVECTOMY  . LEFT CARPAL TUNNEL / LEFT  MIDDLE & RING FINGER TRIGGER RELEASE  08-26-2008  . LEFT SHOULDER ARTHROSCOPY W/ DEBRIDEMENT  09-09-2003  . LEFT SHOULDER ARTHROSCOPY/ LEFT THUMB TRIGGER RELEASE  02-22-2005  . PULLEY RELEASE LEFT LONG FINGER  07-14-2009  . RIGHT CARPAL TUNNEL/ RIGHT THUMB TRIGGER RELEASE'S  11-28-2006  . RIGHT SHOULDER ARTHROSCOPY W/ ROTATOR CUFF REPAIR  01-13-2004  . SHOULDER ARTHROSCOPY DISTAL CLAVICLE EXCISION AND OPEN ROTATOR CUFF REPAIR  09-07-2004   LEFT  . SHOULDER ARTHROSCOPY W/ ACROMIAL REPAIR  11-29-2005   LEFT  . TONSILLECTOMY AND ADENOIDECTOMY  child  . TOTAL KNEE ARTHROPLASTY  12-14-2009   RIGHT  . TOTAL KNEE ARTHROPLASTY Left 11/05/2012   Procedure: LEFT TOTAL KNEE ARTHROPLASTY;  Surgeon: Loanne DrillingFrank V Aluisio, MD;  Location: [facility];  Service: Orthopedics;  Laterality: Left;  . TOTAL KNEE REVISION Right 07/29/2016   Procedure: RIGHT KNEE POLY-LINER EXCHANGE;  Surgeon: Kathryne HitchBlackman, Christopher Y, MD;  Location: [facility];  Service: Orthopedics;  Laterality: Right;  . TRIGGER FINGER RELEASE Right 02/21/2013   Procedure: RIGHT RING A-1 PULLEY RELEASE    (MINOR PROCEDURE) ;  Surgeon: Wyn Forsterobert V Sypher Jr., MD;  Location: [facility];  Service: Orthopedics;  Laterality: Right;  . TRIGGER FINGER RELEASE Left 09/22/2016   Procedure: RELEASE TRIGGER FINGER LEFT INDEX FINGER;  Surgeon: Kathryne Hitch, MD;  Location: [facility];  Service: Orthopedics;  Laterality: Left;  . TRIGGER FINGER RELEASE Right 01/20/2017   Procedure: RELEASE TRIGGER FINGER/A-1 PULLEY RIGHT INDEX FINGER;  Surgeon: Marcene Corning, MD;  Location: [facility];  Service: Orthopedics;  Laterality: Right;    There were no vitals filed for this visit.  Subjective Assessment - 12/04/17 1537    Subjective  I felt good after last session. I went to the pool and did my exercises/swimming, worked today and now I am hurting very badly, like I did in the beginning. Pt with antalgic gait.     Pertinent History  diabetes, HTN,  obesity, widespread OA    Limitations  Sitting;Standing;Walking    How long can you sit comfortably?  15 minutes     How long can you stand comfortably?  15 minutes    How long can you walk comfortably?  10 minutes    Diagnostic tests  x-ray: Spondylosisthesis L4/5    Patient Stated Goals  reduce LBP    Currently in Pain?  Yes    Pain Score  9     Pain Location  Back    Pain Orientation  Right;Lower    Pain Descriptors / Indicators  Sharp    Aggravating Factors   Not sure today    Pain Relieving Factors  ice/heat    Multiple Pain Sites  No                       [facility] Adult PT Treatment/Exercise - 12/04/17 0001      Moist Heat Therapy   Number Minutes Moist Heat  20 Minutes    Moist Heat Location  Lumbar Spine   RT     Electrical Stimulation   Electrical Stimulation Location  LT lumbar   Began treatment with this in LT sidelying   Electrical Stimulation Action  IFC    Electrical Stimulation Parameters  80-150 Hz    Electrical Stimulation Goals  Pain      Manual Therapy   Manual Therapy  Soft tissue mobilization    Soft tissue mobilization  Lt sidelying: RT lumbar, glutes, and lateral hip. Very tender to touch.                PT Short Term Goals - 11/27/17 0957      PT SHORT TERM GOAL #1   Title  be independent in initial HEP    Time  4    Period  Weeks    Status  Achieved        PT Long Term Goals - 11/20/17 1509      PT LONG TERM GOAL #1   Title  be independent in advanced HEP    Time  8    Period  Weeks    Status  New    Target Date  01/15/18      PT LONG TERM GOAL #2   Title  reduce FOTO to < or = 39% limitation    Baseline  --    Time  8    Period  Weeks    Status  New    Target Date  01/15/18      PT LONG TERM GOAL #3   Title  tolerate sitting > or =  to 25-30 minutes without limitation to improve tolerance for work tasks    Baseline  --    Time  8    Period  Weeks    Status  New    Target Date  01/15/18      PT LONG  TERM GOAL #4   Title  verbalize and demonstrate body mechanics modifications and use abdominal bracing for lumbar protection     Baseline  --    Time  8    Period  Weeks    Status  New    Target Date  01/15/18      PT LONG TERM GOAL #5   Title  stand for > or = to 25 minutes for home tasks without limitation    Time  8    Period  Weeks    Status  New    Target Date  01/15/18      Additional Long Term Goals   Additional Long Term Goals  Yes      PT LONG TERM GOAL #6   Title  report a 60% reduction in LBP with ADLs and work tasks    Time  8    Period  Weeks    Status  New            Plan - 12/04/17 1540    Clinical Impression Statement  Pt presents today with flare up of her RT sided low back pain. Pt walking very slow, guarded, and with a LT lean. She is unsure why her pain exacerbated. She felt well after her last PT session. Treatment today focused on pain reduction. Pt had active TPs along the RT lumbar paraspinals, and was quite tender to even tender touch lateral hip. Pain was down to 6-7/10 post.    Rehab Potential  Good    PT Frequency  2x / week    PT Duration  8 weeks    PT Treatment/Interventions  ADLs/Self Care Home Management;Cryotherapy;Electrical Stimulation;Ultrasound;Moist Heat;Functional mobility training;Therapeutic activities;Therapeutic exercise;Patient/family education;Neuromuscular re-education;Manual techniques;Passive range of motion;Dry needling;Taping    PT Next Visit Plan  dry needling to lumbar multifidi and Rt gluteals, try e-stim with needles, core activation exercises.     PT Home Exercise Plan  Access Code: [email or web address]    Consulted and Agree with Plan of Care  Patient       Patient will benefit from skilled therapeutic intervention in order to improve the following deficits and impairments:  Improper body mechanics, Pain, Increased muscle spasms, Postural dysfunction, Decreased activity tolerance, Decreased strength, Decreased range of motion,  Impaired flexibility  Visit Diagnosis: Chronic bilateral low back pain with bilateral sciatica  Muscle weakness (generalized)  Cramp and spasm     Problem List Patient Active Problem List   Diagnosis Date Noted  . Other fatigue 07/18/2017  . Shortness of breath on exertion 07/18/2017  . Type 2 diabetes mellitus with retinopathy, with long-term current use of insulin (HCC) 07/18/2017  . Vitamin D deficiency 07/18/2017  . B12 nutritional deficiency 07/18/2017  . Other specified hypothyroidism 07/18/2017  . Acute pain of right knee 12/22/2016  . Trigger index finger of left hand 09/22/2016  . 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 Diabetes (HCC) 01/08/2014  . Essential hypertension 01/08/2014  . Hyperlipidemia 01/08/2014  . Postoperative anemia due to acute blood loss 11/22/2012  . Hyponatremia 11/06/2012  . OA (osteoarthritis)  of knee 11/05/2012  . Villonodular synovitis of knee 04/20/2011    Haydon Dorris, PTA 12/04/2017, 4:23 PM  [facility] [address] [place], [place], [address] Phone: [phone]   Fax:  [phone]  Name: [name] MRN: [id number] Date of Birth: January 10, 1951

## 2017-12-05 ENCOUNTER — Ambulatory Visit (INDEPENDENT_AMBULATORY_CARE_PROVIDER_SITE_OTHER): Payer: 59 | Admitting: Family Medicine

## 2017-12-05 VITALS — BP 124/63 | HR 63 | Temp 97.8°F | Ht 61.0 in | Wt 250.0 lb

## 2017-12-05 DIAGNOSIS — Z6841 Body Mass Index (BMI) 40.0 and over, adult: Secondary | ICD-10-CM | POA: Diagnosis not present

## 2017-12-05 DIAGNOSIS — Z794 Long term (current) use of insulin: Secondary | ICD-10-CM | POA: Diagnosis not present

## 2017-12-05 DIAGNOSIS — E559 Vitamin D deficiency, unspecified: Secondary | ICD-10-CM | POA: Diagnosis not present

## 2017-12-05 DIAGNOSIS — E119 Type 2 diabetes mellitus without complications: Secondary | ICD-10-CM

## 2017-12-05 MED ORDER — VITAMIN D (ERGOCALCIFEROL) 1.25 MG (50000 UNIT) PO CAPS: 50000.0000 [IU] | ORAL_CAPSULE | ORAL | 0 refills | Status: DC

## 2017-12-05 MED FILL — VIT D2 1.25 MG (50,000 UNIT: 1.25 MG | 28 days supply | Qty: 4 | Fill #0

## 2017-12-06 DIAGNOSIS — M545 Low back pain: Secondary | ICD-10-CM | POA: Diagnosis not present

## 2017-12-06 NOTE — Progress Notes (Signed)
Office: [phone]  /  Fax: [phone]   HPI:   Chief Complaint: OBESITY [name] is here to discuss her progress with her obesity treatment plan. She is on the  follow the Category 2 plan and is following her eating plan approximately 65-70 % of the time. She states she is exercising by swimming for 60 minutes 2 times per week. [name] is currently struggling with increased back pain the last 2 weeks and increased nausea causing her to struggle to eat all her food. She is going to physical therapy, but her exercise is limited by pain right now.  Her weight is 250 lb (113.4 kg) today and has not lost weight since her last visit. She has lost 15 lbs since starting treatment with [name].  Diabetes II [name] has a diagnosis of diabetes type II. [name] does not have her blood glucose log and denies any hypoglycemic episodes. She is currently taking trulicity once a week and lantus 40 units BID. Last A1c was increased to 7.4 due to an increase in eating simple carbohydrates, but she states it has been improving.  She has been working on intensive lifestyle modifications including diet, exercise, and weight loss to help control her blood glucose levels.  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 She is currently taking over the counter vit Stefanie but not yet at goal and denies nausea, vomiting or muscle weakness.   Ref. Range 11/28/2017 11:21  Vitamin Stefanie, 25-Hydroxy Latest Ref Range: 30.0 - 100.0 ng/mL 31.4   ALLERGIES: Allergies  Allergen Reactions  . Actos [Pioglitazone Hydrochloride] Swelling and Other (See Comments)    Numbness in lips, HEADACHES SWELLING REACTION UNSPECIFIED   . Avandia [Rosiglitazone] Swelling    Numbness in lips, headaches  SWELLING REACTION UNSPECIFIED   . Tetanus Toxoids Other (See Comments)    Arm swelling, fainted, ?Respiratory distress  (horse serum)  . Food Nausea Only    Eggplant    MEDICATIONS: Current Outpatient Medications on File  Prior to Visit  Medication Sig Dispense Refill  . amLODipine (NORVASC) 5 MG tablet Take 5 mg by mouth every evening.     [name] amoxicillin (AMOXIL) 500 MG capsule as directed. PRIOR TO DENTAL PROCEDURES  0  . atorvastatin (LIPITOR) 40 MG tablet Take 40 mg by mouth every evening.     . cholecalciferol (VITAMIN Stefanie) 1000 units tablet Take 1,000 Units by mouth every evening.     . DULoxetine (CYMBALTA) 60 MG capsule Take 60 mg by mouth daily.    . flecainide (TAMBOCOR) 50 MG tablet TAKE 1 TABLET BY MOUTH TWICE A DAY 180 tablet 2  . hydrochlorothiazide (MICROZIDE) 12.5 MG capsule Take 1 capsule by mouth daily.  3  . HYDROcodone-acetaminophen (NORCO/VICODIN) 5-325 MG tablet TAKE 1 TABLET BY MOUTH EVERY 6 HOURS AS NEEDED FOR MODERATE PAIN. 30 tablet 0  . indomethacin (INDOCIN) 50 MG capsule Take 50 mg by mouth 3 (three) times daily as needed (gout).    . insulin glargine (LANTUS) 100 UNIT/ML injection Inject 40 Units into the skin 2 (two) times daily.     . insulin lispro (HUMALOG) 100 UNIT/ML cartridge Inject 24 Units into the skin 3 (three) times daily with meals. UNLESS BLOOD SUGAR IS 100 OR LESS     . ketorolac (TORADOL) 10 MG tablet Take 10 mg by mouth 3 (three) times daily as needed (CHEST WALL PAIN/PLURACY).    [name] levothyroxine (SYNTHROID, LEVOTHROID) 125 MCG tablet Take 125 mcg by mouth daily before  breakfast.     . Melatonin 5 MG CAPS Take 5 mg by mouth at bedtime as needed (sleep).     . metFORMIN (GLUCOPHAGE-XR) 500 MG 24 hr tablet Take 1,000 mg by mouth daily with supper.    . methocarbamol (ROBAXIN) 500 MG tablet TAKE 1 TABLET BY MOUTH EVERY 6 HOURS AS NEEDED FOR MUSCLE SPASMS 60 tablet 0  . metoprolol tartrate (LOPRESSOR) 50 MG tablet Take 1 tablet (50 mg total) by mouth 2 (two) times daily. 180 tablet 2  . omeprazole (PRILOSEC) 20 MG capsule Take 20 mg by mouth every evening.     . TRULICITY 1.5 MG/0.5ML SOPN as directed.  11  . valsartan (DIOVAN) 320 MG tablet Take 1 tablet by mouth daily.  3   . Vitamin Stefanie, Ergocalciferol, (DRISDOL) 50000 units CAPS capsule Take 1 capsule (50,000 Units total) by mouth every 7 (seven) days. 4 capsule 0  . XARELTO 20 MG TABS tablet TAKE 1 TABLET BY MOUTH ONCE DAILY WITH SUPPER 30 tablet 9   No current facility-administered medications on file prior to visit.     PAST MEDICAL HISTORY: Past Medical History:  Diagnosis Date  . A-fib (HCC)   . Anemia    hx of  . Arthritis   . Chest pain   . Constipation   . Depression   . Diabetes mellitus ORAL MEDS  . Diabetic retinopathy (HCC)   . Dyspnea    with minimal exertion-deconditioned  . Dyspnea   . Dysrhythmia    a-fib  . Food allergy   . GERD (gastroesophageal reflux disease)    occasional  . Gout   . History of kidney stones   . Hypercholesteremia   . Hyperlipidemia   . Hypertension   . Hypertensive kidney disease   . Hypothyroidism   . Knee pain, right   . 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 Obesity   . OSA on CPAP    uses CPAP nightly  . Osteoarthritis   . Palpitations   . Pinched nerve in neck   . Pleurisy   . Pneumonia    hx of  . PONV (postoperative nausea and vomiting)    for 3-4 days after general anesthesia  . Sciatica   . Swelling of knee joint, right   . Synovitis of knee RIGHT  . Trigger finger, left    left index  . Vitamin Stefanie deficiency     PAST SURGICAL HISTORY: Past Surgical History:  Procedure Laterality Date  . CESAREAN SECTION  X3  . KNEE ARTHROSCOPY  04/20/2011   Procedure: ARTHROSCOPY KNEE;  Surgeon: Loanne Drilling, MD;  Location: [facility];  Service: Orthopedics;  Laterality: Right;  WITH SYNOVECTOMY  . LEFT CARPAL TUNNEL / LEFT MIDDLE & RING FINGER TRIGGER RELEASE  08-26-2008  . LEFT SHOULDER ARTHROSCOPY W/ DEBRIDEMENT  09-09-2003  . LEFT SHOULDER ARTHROSCOPY/ LEFT THUMB TRIGGER RELEASE  02-22-2005  . PULLEY RELEASE LEFT LONG FINGER  07-14-2009  . RIGHT CARPAL TUNNEL/ RIGHT THUMB TRIGGER RELEASE'S  11-28-2006  .  RIGHT SHOULDER ARTHROSCOPY W/ ROTATOR CUFF REPAIR  01-13-2004  . SHOULDER ARTHROSCOPY DISTAL CLAVICLE EXCISION AND OPEN ROTATOR CUFF REPAIR  09-07-2004   LEFT  . SHOULDER ARTHROSCOPY W/ ACROMIAL REPAIR  11-29-2005   LEFT  . TONSILLECTOMY AND ADENOIDECTOMY  child  . TOTAL KNEE ARTHROPLASTY  12-14-2009   RIGHT  . TOTAL KNEE ARTHROPLASTY Left 11/05/2012   Procedure: LEFT TOTAL KNEE ARTHROPLASTY;  Surgeon: Loanne Drilling,  MD;  Location: [facility];  Service: Orthopedics;  Laterality: Left;  . TOTAL KNEE REVISION Right 07/29/2016   Procedure: RIGHT KNEE POLY-LINER EXCHANGE;  Surgeon: Kathryne Hitch, MD;  Location: [facility];  Service: Orthopedics;  Laterality: Right;  . TRIGGER FINGER RELEASE Right 02/21/2013   Procedure: RIGHT RING A-1 PULLEY RELEASE    (MINOR PROCEDURE) ;  Surgeon: Wyn Forster., MD;  Location: [facility];  Service: Orthopedics;  Laterality: Right;  . TRIGGER FINGER RELEASE Left 09/22/2016   Procedure: RELEASE TRIGGER FINGER LEFT INDEX FINGER;  Surgeon: Kathryne Hitch, MD;  Location: [facility];  Service: Orthopedics;  Laterality: Left;  . TRIGGER FINGER RELEASE Right 01/20/2017   Procedure: RELEASE TRIGGER FINGER/A-1 PULLEY RIGHT INDEX FINGER;  Surgeon: Marcene Corning, MD;  Location: [facility];  Service: Orthopedics;  Laterality: Right;    SOCIAL HISTORY: Social History   Tobacco Use  . Smoking status: Former Smoker    Years: 5.00    Types: Cigarettes    Last attempt to quit: 04/12/1977    Years since quitting: 40.6  . Smokeless tobacco: Never Used  Substance Use Topics  . Alcohol use: No  . Drug use: No    FAMILY HISTORY: Family History  Problem Relation Age of Onset  . Diabetes Mother   . Hypertension Mother   . Heart attack Mother   . Thyroid disease Mother   . Obesity Mother   . Cancer Father   . Liver disease Father   . Sleep apnea Father   . Alcoholism Father     ROS: Review of Systems  Constitutional:  Negative for weight loss.  Gastrointestinal: Negative for nausea and vomiting.  Musculoskeletal:       Negative for muscle weakness  Endo/Heme/Allergies:       Negative for hypoglycemia    PHYSICAL EXAM: Blood pressure 124/63, pulse 63, temperature 97.8 F (36.6 C), temperature source Oral, height 5\' 1"  (1.549 m), weight 250 lb (113.4 kg), SpO2 95 %. Body mass index is 47.24 kg/m. Physical Exam  Constitutional: She is oriented to person, place, and time. She appears well-developed and well-nourished.  HENT:  Head: Normocephalic.  Cardiovascular: Normal rate.  Pulmonary/Chest: Effort normal.  Musculoskeletal: Normal range of motion.  Neurological: She is oriented to person, place, and time.  Skin: Skin is warm and dry.  Psychiatric: She has a normal mood and affect. Her behavior is normal.  Vitals reviewed.   RECENT LABS AND TESTS: BMET    Component Value Date/Time   NA 142 11/28/2017 1121   K 4.3 11/28/2017 1121   CL 106 11/28/2017 1121   CO2 19 (L) 11/28/2017 1121   GLUCOSE 91 11/28/2017 1121   GLUCOSE 214 (H) 01/17/2017 1459   BUN 25 11/28/2017 1121   CREATININE 1.12 (H) 11/28/2017 1121   CALCIUM 8.6 (L) 11/28/2017 1121   GFRNONAA 51 (L) 11/28/2017 1121   GFRAA 58 (L) 11/28/2017 1121   Lab Results  Component Value Date   HGBA1C 7.4 (H) 11/28/2017   HGBA1C 6.8 (H) 07/18/2017   HGBA1C 7.7 (H) 07/20/2016   HGBA1C 7.9 09/18/2014   No results found for: INSULIN CBC    Component Value Date/Time   WBC 10.0 07/18/2017 1112   WBC 8.6 09/22/2016 0733   RBC 4.57 07/18/2017 1112   RBC 4.31 09/22/2016 0733   HGB 13.0 07/18/2017 1112   HCT 39.6 07/18/2017 1112   PLT 284 09/22/2016 0733   MCV 87 07/18/2017 1112  MCH 28.4 07/18/2017 1112   MCH 28.5 09/22/2016 0733   MCHC 32.8 07/18/2017 1112   MCHC 31.4 09/22/2016 0733   RDW 15.3 07/18/2017 1112   LYMPHSABS 1.8 07/18/2017 1112   EOSABS 0.1 07/18/2017 1112   BASOSABS 0.0 07/18/2017 1112   Iron/TIBC/Ferritin/  %Sat No results found for: IRON, TIBC, FERRITIN, IRONPCTSAT Lipid Panel     Component Value Date/Time   CHOL 126 07/18/2017 1112   TRIG 98 07/18/2017 1112   HDL 43 07/18/2017 1112   LDLCALC 63 07/18/2017 1112   Hepatic Function Panel     Component Value Date/Time   PROT 6.5 11/28/2017 1121   ALBUMIN 3.7 11/28/2017 1121   AST 25 11/28/2017 1121   ALT 24 11/28/2017 1121   ALKPHOS 74 11/28/2017 1121   BILITOT 0.3 11/28/2017 1121      Component Value Date/Time   TSH 3.740 07/18/2017 1112    Ref. Range 11/28/2017 11:21  Vitamin Stefanie, 25-Hydroxy Latest Ref Range: 30.0 - 100.0 ng/mL 31.4    ASSESSMENT AND PLAN: Type 2 diabetes mellitus without complication, with long-term current use of insulin (HCC)  Vitamin Stefanie deficiency - Plan: Vitamin Stefanie, Ergocalciferol, (DRISDOL) 50000 units CAPS capsule  Class 3 severe obesity with serious comorbidity and body mass index (BMI) of 45.0 to 49.9 in adult, unspecified obesity type (HCC)  PLAN: Diabetes II [name] has been given extensive diabetes education by myself today including ideal fasting and post-prandial blood glucose readings, individual ideal HgA1c goals  and hypoglycemia prevention. We discussed the importance of good blood sugar control to decrease the likelihood of diabetic complications such as nephropathy, neuropathy, limb loss, blindness, coronary artery disease, and death. We discussed the importance of intensive lifestyle modification including diet, exercise and weight loss as the first line treatment for diabetes. [name] agrees to continue her diabetes medications and will follow up at the agreed upon time.  Vitamin Stefanie Deficiency [name] was informed that low vitamin Stefanie levels contributes to fatigue and are associated with obesity, breast, and colon cancer. She agrees to restart  prescription Vit Stefanie @50 ,000 IU every week #4 with no refill sand over the counter vitamin Stefanie 2000 IU daily and will follow up for routine testing of vitamin Stefanie, at  least 2-3 times per year. She was informed of the risk of over-replacement of vitamin Stefanie and agrees to not increase her dose unless she discusses this with [name] first. She agrees to follow up in our clinic as directed.   Obesity [name] is currently in the action stage of change. As such, her goal is to continue with weight loss efforts She has agreed to follow the Category 2 plan [name] has been instructed to work up to a goal of 150 minutes of combined cardio and strengthening exercise per week for weight loss and overall health benefits. We discussed the following Behavioral Modification Strategies today: increasing lean protein intake and decreasing simple carbohydrates    [name] has agreed to follow up with our clinic in 3 weeks. She was informed of the importance of frequent follow up visits to maximize her success with intensive lifestyle modifications for her multiple health conditions.   OBESITY BEHAVIORAL INTERVENTION VISIT  Today's visit was # 8   Starting weight: 265 lb Starting date: 07/18/17 Today's weight : 250 lb Today's date: 12/05/17 Total lbs lost to date: 15 lb At least 15 minutes were spent on discussing the following behavioral intervention visit.   ASK: We discussed the diagnosis of obesity with [name]  [name] today and [name] agreed to give [name] permission to discuss obesity behavioral modification therapy today.  ASSESS: [name] has the diagnosis of obesity and her BMI today is 47.26 [name] is in the action stage of change   ADVISE: [name] was educated on the multiple health risks of obesity as well as the benefit of weight loss to improve her health. She was advised of the need for long term treatment and the importance of lifestyle modifications to improve her current health and to decrease her risk of future health problems.  AGREE: Multiple dietary modification options and treatment options were discussed and  Jessenya agreed to follow the recommendations  documented in the above note.  ARRANGE: Janitza was educated on the importance of frequent visits to treat obesity as outlined per CMS and USPSTF guidelines and agreed to schedule her next follow up appointment today.  I, Jeralene Peters, am acting as transcriptionist for Quillian Quince, MD  I have reviewed the above documentation for accuracy and completeness, and I agree with the above. -Quillian Quince, MD

## 2017-12-07 ENCOUNTER — Ambulatory Visit: Payer: 59

## 2017-12-07 DIAGNOSIS — R252 Cramp and spasm: Secondary | ICD-10-CM | POA: Diagnosis not present

## 2017-12-07 DIAGNOSIS — M25641 Stiffness of right hand, not elsewhere classified: Secondary | ICD-10-CM | POA: Diagnosis not present

## 2017-12-07 DIAGNOSIS — M5441 Lumbago with sciatica, right side: Principal | ICD-10-CM

## 2017-12-07 DIAGNOSIS — M25561 Pain in right knee: Secondary | ICD-10-CM | POA: Diagnosis not present

## 2017-12-07 DIAGNOSIS — G8929 Other chronic pain: Secondary | ICD-10-CM | POA: Diagnosis not present

## 2017-12-07 DIAGNOSIS — M5442 Lumbago with sciatica, left side: Secondary | ICD-10-CM | POA: Diagnosis not present

## 2017-12-07 DIAGNOSIS — M6281 Muscle weakness (generalized): Secondary | ICD-10-CM | POA: Diagnosis not present

## 2017-12-07 DIAGNOSIS — R262 Difficulty in walking, not elsewhere classified: Secondary | ICD-10-CM | POA: Diagnosis not present

## 2017-12-07 DIAGNOSIS — M79644 Pain in right finger(s): Secondary | ICD-10-CM | POA: Diagnosis not present

## 2017-12-07 NOTE — Therapy (Signed)
[facility] [address] [place], [place], [address] Phone: [phone]   Fax:  [phone]  Physical Therapy Treatment  Patient Details  Name: [name] MRN: [id number] Date of Birth: 1950/08/30 Referring Provider: Marcene Corning, MD   Encounter Date: 12/07/2017  PT End of Session - 12/07/17 1051    Visit Number  6    Date for PT Re-Evaluation  01/15/18    PT Start Time  1017    PT Stop Time  1106    PT Time Calculation (min)  49 min    Activity Tolerance  Patient tolerated treatment well    Behavior During Therapy  [facility] for tasks assessed/performed       Past Medical History:  Diagnosis Date  . A-fib (HCC)   . Anemia    hx of  . Arthritis   . Chest pain   . Constipation   . Depression   . Diabetes mellitus ORAL MEDS  . Diabetic retinopathy (HCC)   . Dyspnea    with minimal exertion-deconditioned  . Dyspnea   . Dysrhythmia    a-fib  . Food allergy   . GERD (gastroesophageal reflux disease)    occasional  . Gout   . History of kidney stones   . Hypercholesteremia   . Hyperlipidemia   . Hypertension   . Hypertensive kidney disease   . Hypothyroidism   . Knee pain, right   . 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 Obesity   . OSA on CPAP    uses CPAP nightly  . Osteoarthritis   . Palpitations   . Pinched nerve in neck   . Pleurisy   . Pneumonia    hx of  . PONV (postoperative nausea and vomiting)    for 3-4 days after general anesthesia  . Sciatica   . Swelling of knee joint, right   . Synovitis of knee RIGHT  . Trigger finger, left    left index  . Vitamin D deficiency     Past Surgical History:  Procedure Laterality Date  . CESAREAN SECTION  X3  . KNEE ARTHROSCOPY  04/20/2011   Procedure: ARTHROSCOPY KNEE;  Surgeon: Loanne Drilling, MD;  Location: [facility];  Service: Orthopedics;  Laterality: Right;  WITH SYNOVECTOMY  . LEFT CARPAL TUNNEL / LEFT  MIDDLE & RING FINGER TRIGGER RELEASE  08-26-2008  . LEFT SHOULDER ARTHROSCOPY W/ DEBRIDEMENT  09-09-2003  . LEFT SHOULDER ARTHROSCOPY/ LEFT THUMB TRIGGER RELEASE  02-22-2005  . PULLEY RELEASE LEFT LONG FINGER  07-14-2009  . RIGHT CARPAL TUNNEL/ RIGHT THUMB TRIGGER RELEASE'S  11-28-2006  . RIGHT SHOULDER ARTHROSCOPY W/ ROTATOR CUFF REPAIR  01-13-2004  . SHOULDER ARTHROSCOPY DISTAL CLAVICLE EXCISION AND OPEN ROTATOR CUFF REPAIR  09-07-2004   LEFT  . SHOULDER ARTHROSCOPY W/ ACROMIAL REPAIR  11-29-2005   LEFT  . TONSILLECTOMY AND ADENOIDECTOMY  child  . TOTAL KNEE ARTHROPLASTY  12-14-2009   RIGHT  . TOTAL KNEE ARTHROPLASTY Left 11/05/2012   Procedure: LEFT TOTAL KNEE ARTHROPLASTY;  Surgeon: Loanne Drilling, MD;  Location: [facility];  Service: Orthopedics;  Laterality: Left;  . TOTAL KNEE REVISION Right 07/29/2016   Procedure: RIGHT KNEE POLY-LINER EXCHANGE;  Surgeon: Kathryne Hitch, MD;  Location: [facility];  Service: Orthopedics;  Laterality: Right;  . TRIGGER FINGER RELEASE Right 02/21/2013   Procedure: RIGHT RING A-1 PULLEY RELEASE    (MINOR PROCEDURE) ;  Surgeon: Wyn Forster., MD;  Location: [facility];  Service: Orthopedics;  Laterality: Right;  . TRIGGER FINGER RELEASE Left 09/22/2016   Procedure: RELEASE TRIGGER FINGER LEFT INDEX FINGER;  Surgeon: Kathryne Hitch, MD;  Location: [facility];  Service: Orthopedics;  Laterality: Left;  . TRIGGER FINGER RELEASE Right 01/20/2017   Procedure: RELEASE TRIGGER FINGER/A-1 PULLEY RIGHT INDEX FINGER;  Surgeon: Marcene Corning, MD;  Location: [facility];  Service: Orthopedics;  Laterality: Right;    There were no vitals filed for this visit.  Subjective Assessment - 12/07/17 1019    Subjective  I'm still really hurting today in bil low back.  now with shooting down Rt leg    Pertinent History  diabetes, HTN, obesity, widespread OA    Currently in Pain?  Yes    Pain Score  5     Pain Location  Back     Pain Orientation  Right;Lower    Pain Descriptors / Indicators  Sharp    Pain Onset  More than a month ago    Pain Frequency  Constant                       [facility] Adult PT Treatment/Exercise - 12/07/17 0001      Moist Heat Therapy   Number Minutes Moist Heat  15 Minutes    Moist Heat Location  Lumbar Spine      Electrical Stimulation   Electrical Stimulation Location  bil lumbar and Rt gluteals    Electrical Stimulation Action  IFC    Electrical Stimulation Parameters  15 minutes    Electrical Stimulation Goals  Pain      Manual Therapy   Manual Therapy  Soft tissue mobilization    Manual therapy comments  mobilization and trigger point release to bil lumbar paraspinals and Rt gluteals.         Trigger Point Dry Needling - 12/07/17 1020    Consent Given?  Yes    Education Handout Provided  Yes    Muscles Treated Lower Body  Gluteus minimus;Gluteus maximus   Rt gluteals.  Bil lumbar multifidi          PT Education - 12/07/17 1021    Education Details  Dry needling information  (Pended)     Person(s) Educated  Patient  (Pended)     Methods  Explanation;Handout  (Pended)     Comprehension  Verbalized understanding  (Pended)        PT Short Term Goals - 11/27/17 0957      PT SHORT TERM GOAL #1   Title  be independent in initial HEP    Time  4    Period  Weeks    Status  Achieved        PT Long Term Goals - 11/20/17 1509      PT LONG TERM GOAL #1   Title  be independent in advanced HEP    Time  8    Period  Weeks    Status  New    Target Date  01/15/18      PT LONG TERM GOAL #2   Title  reduce FOTO to < or = 39% limitation    Baseline  --    Time  8    Period  Weeks    Status  New    Target Date  01/15/18      PT LONG TERM GOAL #3   Title  tolerate sitting > or =  to 25-30 minutes without limitation to improve tolerance for work tasks    Baseline  --    Time  8    Period  Weeks    Status  New    Target Date  01/15/18      PT  LONG TERM GOAL #4   Title  verbalize and demonstrate body mechanics modifications and use abdominal bracing for lumbar protection     Baseline  --    Time  8    Period  Weeks    Status  New    Target Date  01/15/18      PT LONG TERM GOAL #5   Title  stand for > or = to 25 minutes for home tasks without limitation    Time  8    Period  Weeks    Status  New    Target Date  01/15/18      Additional Long Term Goals   Additional Long Term Goals  Yes      PT LONG TERM GOAL #6   Title  report a 60% reduction in LBP with ADLs and work tasks    Time  8    Period  Weeks    Status  New            Plan - 12/07/17 1049    Clinical Impression Statement  Pt with continued flare up of LBP and Rt LE radiculopathy.  Session today focused on lumbar and Rt gluteal dry needling and manual therapy for muscle tension and pain.  Pt with hypersensitivity to touch over Rt gluteal prior to needling and tolerated increased pressure after treatment today.  Pt has been doing her HEP gently at home as able.  Pt will continue to benefit from skilled PT for management of pain, muscle tension and address core and hip strength and flexibility.      Rehab Potential  Good    PT Frequency  2x / week    PT Duration  8 weeks    PT Treatment/Interventions  ADLs/Self Care Home Management;Cryotherapy;Electrical Stimulation;Ultrasound;Moist Heat;Functional mobility training;Therapeutic activities;Therapeutic exercise;Patient/family education;Neuromuscular re-education;Manual techniques;Passive range of motion;Dry needling;Taping    PT Next Visit Plan  assess response to dry needling, add e-stim to lumbar multifidi needling, gentle exercise as able    PT Home Exercise Plan  Access Code: [email or web address]    Recommended Other Services  initial certification is signed    Consulted and Agree with Plan of Care  Patient       Patient will benefit from skilled therapeutic intervention in order to improve the following deficits and  impairments:  Improper body mechanics, Pain, Increased muscle spasms, Postural dysfunction, Decreased activity tolerance, Decreased strength, Decreased range of motion, Impaired flexibility  Visit Diagnosis: Chronic bilateral low back pain with bilateral sciatica  Muscle weakness (generalized)  Cramp and spasm     Problem List Patient Active Problem List   Diagnosis Date Noted  . Other fatigue 07/18/2017  . Shortness of breath on exertion 07/18/2017  . Type 2 diabetes mellitus with retinopathy, with long-term current use of insulin (HCC) 07/18/2017  . Vitamin D deficiency 07/18/2017  . B12 nutritional deficiency 07/18/2017  . Other specified hypothyroidism 07/18/2017  . Acute pain of right knee 12/22/2016  . Trigger index finger of left hand 09/22/2016  . 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 Diabetes (HCC) 01/08/2014  .  Essential hypertension 01/08/2014  . Hyperlipidemia 01/08/2014  . Postoperative anemia due to acute blood loss 11/22/2012  . Hyponatremia 11/06/2012  . OA (osteoarthritis) of knee 11/05/2012  . Villonodular synovitis of knee 04/20/2011   Lorrene Reid, PT 12/07/17 10:52 AM  [facility] [address], [address] [place], [place], [address] Phone: [phone]   Fax:  [phone]  Name: LAKEISHIA TRULUCK MRN: [id number] Date of Birth: 06-Oct-1950

## 2017-12-07 NOTE — Patient Instructions (Signed)

## 2017-12-11 ENCOUNTER — Ambulatory Visit: Payer: 59

## 2017-12-11 DIAGNOSIS — M79644 Pain in right finger(s): Secondary | ICD-10-CM | POA: Diagnosis not present

## 2017-12-11 DIAGNOSIS — R262 Difficulty in walking, not elsewhere classified: Secondary | ICD-10-CM | POA: Diagnosis not present

## 2017-12-11 DIAGNOSIS — G8929 Other chronic pain: Secondary | ICD-10-CM | POA: Diagnosis not present

## 2017-12-11 DIAGNOSIS — R252 Cramp and spasm: Secondary | ICD-10-CM | POA: Diagnosis not present

## 2017-12-11 DIAGNOSIS — M25561 Pain in right knee: Secondary | ICD-10-CM | POA: Diagnosis not present

## 2017-12-11 DIAGNOSIS — M5442 Lumbago with sciatica, left side: Principal | ICD-10-CM

## 2017-12-11 DIAGNOSIS — M5441 Lumbago with sciatica, right side: Secondary | ICD-10-CM | POA: Diagnosis not present

## 2017-12-11 DIAGNOSIS — M6281 Muscle weakness (generalized): Secondary | ICD-10-CM | POA: Diagnosis not present

## 2017-12-11 DIAGNOSIS — M25641 Stiffness of right hand, not elsewhere classified: Secondary | ICD-10-CM | POA: Diagnosis not present

## 2017-12-11 NOTE — Therapy (Signed)
[facility] [address], [address] [place], [place], [address] Phone: [phone]   Fax:  [phone]  Physical Therapy Treatment  Patient Details  Name: [name] MRN: [id number] Date of Birth: September 16, 1950 Referring Provider (PT): Marcene Corning, MD   Encounter Date: 12/11/2017  PT End of Session - 12/11/17 1611    Visit Number  7    Date for PT Re-Evaluation  01/15/18    PT Start Time  1533   dry needling   PT Stop Time  1623    PT Time Calculation (min)  50 min    Activity Tolerance  Patient tolerated treatment well    Behavior During Therapy  [facility] for tasks assessed/performed       Past Medical History:  Diagnosis Date  . A-fib (HCC)   . Anemia    hx of  . Arthritis   . Chest pain   . Constipation   . Depression   . Diabetes mellitus [facility]  . Diabetic retinopathy (HCC)   . Dyspnea    with minimal exertion-deconditioned  . Dyspnea   . Dysrhythmia    a-fib  . Food allergy   . GERD (gastroesophageal reflux disease)    occasional  . Gout   . History of kidney stones   . Hypercholesteremia   . Hyperlipidemia   . Hypertension   . Hypertensive kidney disease   . Hypothyroidism   . Knee pain, right   . 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 Obesity   . OSA on CPAP    uses CPAP nightly  . Osteoarthritis   . Palpitations   . Pinched nerve in neck   . Pleurisy   . Pneumonia    hx of  . PONV (postoperative nausea and vomiting)    for 3-4 days after general anesthesia  . Sciatica   . Swelling of knee joint, right   . Synovitis of knee RIGHT  . Trigger finger, left    left index  . Vitamin D deficiency     Past Surgical History:  Procedure Laterality Date  . CESAREAN SECTION  X3  . KNEE ARTHROSCOPY  04/20/2011   Procedure: ARTHROSCOPY KNEE;  Surgeon: Loanne Drilling, MD;  Location: [facility];  Service: Orthopedics;  Laterality: Right;  WITH SYNOVECTOMY  . LEFT  CARPAL TUNNEL / LEFT MIDDLE & RING FINGER TRIGGER RELEASE  08-26-2008  . LEFT SHOULDER ARTHROSCOPY W/ DEBRIDEMENT  09-09-2003  . LEFT SHOULDER ARTHROSCOPY/ LEFT THUMB TRIGGER RELEASE  02-22-2005  . PULLEY RELEASE LEFT LONG FINGER  07-14-2009  . RIGHT CARPAL TUNNEL/ RIGHT THUMB TRIGGER RELEASE'S  11-28-2006  . RIGHT SHOULDER ARTHROSCOPY W/ ROTATOR CUFF REPAIR  01-13-2004  . SHOULDER ARTHROSCOPY DISTAL CLAVICLE EXCISION AND OPEN ROTATOR CUFF REPAIR  09-07-2004   LEFT  . SHOULDER ARTHROSCOPY W/ ACROMIAL REPAIR  11-29-2005   LEFT  . TONSILLECTOMY AND ADENOIDECTOMY  child  . TOTAL KNEE ARTHROPLASTY  12-14-2009   RIGHT  . TOTAL KNEE ARTHROPLASTY Left 11/05/2012   Procedure: LEFT TOTAL KNEE ARTHROPLASTY;  Surgeon: Loanne Drilling, MD;  Location: [facility];  Service: Orthopedics;  Laterality: Left;  . TOTAL KNEE REVISION Right 07/29/2016   Procedure: RIGHT KNEE POLY-LINER EXCHANGE;  Surgeon: Kathryne Hitch, MD;  Location: [facility];  Service: Orthopedics;  Laterality: Right;  . TRIGGER FINGER RELEASE Right 02/21/2013   Procedure: RIGHT RING A-1 PULLEY RELEASE    (MINOR PROCEDURE) ;  Surgeon: Molly Maduro  Glenice Bow., MD;  Location: [facility];  Service: Orthopedics;  Laterality: Right;  . TRIGGER FINGER RELEASE Left 09/22/2016   Procedure: RELEASE TRIGGER FINGER LEFT INDEX FINGER;  Surgeon: Kathryne Hitch, MD;  Location: [facility];  Service: Orthopedics;  Laterality: Left;  . TRIGGER FINGER RELEASE Right 01/20/2017   Procedure: RELEASE TRIGGER FINGER/A-1 PULLEY RIGHT INDEX FINGER;  Surgeon: Marcene Corning, MD;  Location: [facility];  Service: Orthopedics;  Laterality: Right;    There were no vitals filed for this visit.  Subjective Assessment - 12/11/17 1536    Subjective  I felt better after dry needling.  I was sore for a few days and then now it is better.   The pain in my low back is less intense.      Currently in Pain?  Yes    Pain Score  3     Pain  Orientation  Right;Lower    Pain Type  Chronic pain    Pain Onset  More than a month ago    Pain Frequency  Constant    Pain Relieving Factors  ice/heat, dry needling                       [facility] Adult PT Treatment/Exercise - 12/11/17 0001      Moist Heat Therapy   Number Minutes Moist Heat  15 Minutes    Moist Heat Location  Lumbar Spine      Electrical Stimulation   Electrical Stimulation Location  bil lumbar and Rt gluteals    Electrical Stimulation Action  IFC    Electrical Stimulation Parameters  15 minutes    Electrical Stimulation Goals  Pain      Manual Therapy   Manual Therapy  Soft tissue mobilization    Manual therapy comments  mobilization and trigger point release to bil lumbar paraspinals and Rt gluteals.         Trigger Point Dry Needling - 12/11/17 1538    Consent Given?  Yes    Muscles Treated Lower Body  Gluteus minimus;Gluteus maximus   bil lumbar multifidi, Rt gluteals   Gluteus Maximus Response  Twitch response elicited;Palpable increased muscle length    Gluteus Minimus Response  Twitch response elicited;Palpable increased muscle length             PT Short Term Goals - 11/27/17 0957      PT SHORT TERM GOAL #1   Title  be independent in initial HEP    Time  4    Period  Weeks    Status  Achieved        PT Long Term Goals - 11/20/17 1509      PT LONG TERM GOAL #1   Title  be independent in advanced HEP    Time  8    Period  Weeks    Status  New    Target Date  01/15/18      PT LONG TERM GOAL #2   Title  reduce FOTO to < or = 39% limitation    Baseline  --    Time  8    Period  Weeks    Status  New    Target Date  01/15/18      PT LONG TERM GOAL #3   Title  tolerate sitting > or = to 25-30 minutes without limitation to improve tolerance for work tasks    Baseline  --  Time  8    Period  Weeks    Status  New    Target Date  01/15/18      PT LONG TERM GOAL #4   Title  verbalize and demonstrate body  mechanics modifications and use abdominal bracing for lumbar protection     Baseline  --    Time  8    Period  Weeks    Status  New    Target Date  01/15/18      PT LONG TERM GOAL #5   Title  stand for > or = to 25 minutes for home tasks without limitation    Time  8    Period  Weeks    Status  New    Target Date  01/15/18      Additional Long Term Goals   Additional Long Term Goals  Yes      PT LONG TERM GOAL #6   Title  report a 60% reduction in LBP with ADLs and work tasks    Time  8    Period  Weeks    Status  New            Plan - 12/11/17 1610    Clinical Impression Statement  Pt with significant pain reduction in the days following dry needling last week.  Pt with reduced pain overall and reduced hypersensitivity to palpation over Rt gluteals with manual therapy today.  Pt continues to be independent and compliant with HEP.  Pt with trigger points and tension over Rt gluteals and bil lumbar paraspinals and demonstrated improved tissue mobility and reported reduced pain after dry needling today.  Pt will continue to benefit from skilled PT for core and hip strength, flexibility and manual/modalities to address pain.      Rehab Potential  Good    PT Frequency  2x / week    PT Duration  8 weeks    PT Treatment/Interventions  ADLs/Self Care Home Management;Cryotherapy;Electrical Stimulation;Ultrasound;Moist Heat;Functional mobility training;Therapeutic activities;Therapeutic exercise;Patient/family education;Neuromuscular re-education;Manual techniques;Passive range of motion;Dry needling;Taping    PT Next Visit Plan  assess response to dry needling,  gentle exercise as able, modalities as needed for pain    PT Home Exercise Plan  Access Code: [email or web address]    Consulted and Agree with Plan of Care  Patient       Patient will benefit from skilled therapeutic intervention in order to improve the following deficits and impairments:  Improper body mechanics, Pain, Increased muscle  spasms, Postural dysfunction, Decreased activity tolerance, Decreased strength, Decreased range of motion, Impaired flexibility  Visit Diagnosis: Chronic bilateral low back pain with bilateral sciatica  Muscle weakness (generalized)  Cramp and spasm     Problem List Patient Active Problem List   Diagnosis Date Noted  . Other fatigue 07/18/2017  . Shortness of breath on exertion 07/18/2017  . Type 2 diabetes mellitus with retinopathy, with long-term current use of insulin (HCC) 07/18/2017  . Vitamin D deficiency 07/18/2017  . B12 nutritional deficiency 07/18/2017  . Other specified hypothyroidism 07/18/2017  . Acute pain of right knee 12/22/2016  . Trigger index finger of left hand 09/22/2016  . 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 Diabetes (HCC) 01/08/2014  . Essential hypertension 01/08/2014  . Hyperlipidemia 01/08/2014  . Postoperative anemia due to acute blood loss 11/22/2012  . Hyponatremia 11/06/2012  . OA (osteoarthritis)  of knee 11/05/2012  . Villonodular synovitis of knee 04/20/2011    Lorrene Reid, PT 12/11/17 4:12 PM   [facility] [address], [address] [place], [place], [address] Phone: [phone]   Fax:  [phone]  Name: [name] MRN: [id number] Date of Birth: [birth year]

## 2017-12-13 ENCOUNTER — Ambulatory Visit: Payer: 59 | Attending: Orthopaedic Surgery

## 2017-12-13 DIAGNOSIS — M6281 Muscle weakness (generalized): Secondary | ICD-10-CM | POA: Diagnosis not present

## 2017-12-13 DIAGNOSIS — M5442 Lumbago with sciatica, left side: Secondary | ICD-10-CM | POA: Diagnosis not present

## 2017-12-13 DIAGNOSIS — M5441 Lumbago with sciatica, right side: Secondary | ICD-10-CM | POA: Diagnosis not present

## 2017-12-13 DIAGNOSIS — R252 Cramp and spasm: Secondary | ICD-10-CM | POA: Diagnosis not present

## 2017-12-13 DIAGNOSIS — G8929 Other chronic pain: Secondary | ICD-10-CM | POA: Diagnosis not present

## 2017-12-13 MED FILL — XARELTO 20 MG TABLET: 20 | 30 days supply | Qty: 30 | Fill #2

## 2017-12-13 MED FILL — VALSARTAN 320 MG TAB: 320 | 30 days supply | Qty: 30 | Fill #10

## 2017-12-13 MED FILL — OMEPRAZOLE 20 MG CPDR: 20 | 90 days supply | Qty: 90 | Fill #1

## 2017-12-13 MED FILL — LEVOTHYROXINE 125 MCG TABLE: 125 | 30 days supply | Qty: 30 | Fill #2

## 2017-12-13 NOTE — Therapy (Signed)
[facility] [address], [address] [place], [place], [address] Phone: [phone]   Fax:  [phone]  Physical Therapy Treatment  Patient Details  Name: [name] MRN: [id number] Date of Birth: 08-30-1950 Referring Provider (PT): Marcene Corning, MD   Encounter Date: 12/13/2017  PT End of Session - 12/13/17 1612    Visit Number  8    Date for PT Re-Evaluation  01/15/18    PT Start Time  1529    PT Stop Time  1625    PT Time Calculation (min)  56 min    Activity Tolerance  Patient tolerated treatment well    Behavior During Therapy  [facility] for tasks assessed/performed       Past Medical History:  Diagnosis Date  . A-fib (HCC)   . Anemia    hx of  . Arthritis   . Chest pain   . Constipation   . Depression   . Diabetes mellitus ORAL MEDS  . Diabetic retinopathy (HCC)   . Dyspnea    with minimal exertion-deconditioned  . Dyspnea   . Dysrhythmia    a-fib  . Food allergy   . GERD (gastroesophageal reflux disease)    occasional  . Gout   . History of kidney stones   . Hypercholesteremia   . Hyperlipidemia   . Hypertension   . Hypertensive kidney disease   . Hypothyroidism   . Knee pain, right   . 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 Obesity   . OSA on CPAP    uses CPAP nightly  . Osteoarthritis   . Palpitations   . Pinched nerve in neck   . Pleurisy   . Pneumonia    hx of  . PONV (postoperative nausea and vomiting)    for 3-4 days after general anesthesia  . Sciatica   . Swelling of knee joint, right   . Synovitis of knee RIGHT  . Trigger finger, left    left index  . Vitamin D deficiency     Past Surgical History:  Procedure Laterality Date  . CESAREAN SECTION  X3  . KNEE ARTHROSCOPY  04/20/2011   Procedure: ARTHROSCOPY KNEE;  Surgeon: Loanne Drilling, MD;  Location: [facility];  Service: Orthopedics;  Laterality: Right;  WITH SYNOVECTOMY  . LEFT CARPAL TUNNEL /  LEFT MIDDLE & RING FINGER TRIGGER RELEASE  08-26-2008  . LEFT SHOULDER ARTHROSCOPY W/ DEBRIDEMENT  09-09-2003  . LEFT SHOULDER ARTHROSCOPY/ LEFT THUMB TRIGGER RELEASE  02-22-2005  . PULLEY RELEASE LEFT LONG FINGER  07-14-2009  . RIGHT CARPAL TUNNEL/ RIGHT THUMB TRIGGER RELEASE'S  11-28-2006  . RIGHT SHOULDER ARTHROSCOPY W/ ROTATOR CUFF REPAIR  01-13-2004  . SHOULDER ARTHROSCOPY DISTAL CLAVICLE EXCISION AND OPEN ROTATOR CUFF REPAIR  09-07-2004   LEFT  . SHOULDER ARTHROSCOPY W/ ACROMIAL REPAIR  11-29-2005   LEFT  . TONSILLECTOMY AND ADENOIDECTOMY  child  . TOTAL KNEE ARTHROPLASTY  12-14-2009   RIGHT  . TOTAL KNEE ARTHROPLASTY Left 11/05/2012   Procedure: LEFT TOTAL KNEE ARTHROPLASTY;  Surgeon: Loanne Drilling, MD;  Location: [facility];  Service: Orthopedics;  Laterality: Left;  . TOTAL KNEE REVISION Right 07/29/2016   Procedure: RIGHT KNEE POLY-LINER EXCHANGE;  Surgeon: Kathryne Hitch, MD;  Location: [facility];  Service: Orthopedics;  Laterality: Right;  . TRIGGER FINGER RELEASE Right 02/21/2013   Procedure: RIGHT RING A-1 PULLEY RELEASE    (MINOR PROCEDURE) ;  Surgeon: Wyn Forster.,  MD;  Location: [facility];  Service: Orthopedics;  Laterality: Right;  . TRIGGER FINGER RELEASE Left 09/22/2016   Procedure: RELEASE TRIGGER FINGER LEFT INDEX FINGER;  Surgeon: Kathryne Hitch, MD;  Location[facility];  Service: Orthopedics;  Laterality: Left;  . TRIGGER FINGER RELEASE Right 01/20/2017   Procedure: RELEASE TRIGGER FINGER/A-1 PULLEY RIGHT INDEX FINGER;  Surgeon: Marcene Corning, MD;  Location: [facility];  Service: Orthopedics;  Laterality: Right;    There were no vitals filed for this visit.  Subjective Assessment - 12/13/17 1524    Subjective  I am feeling much better.  No major pain over the past few days.      Currently in Pain?  Yes    Pain Score  1     Pain Location  Back    Pain Orientation  Right;Lower    Pain Type  Chronic pain                        [facility] Adult PT Treatment/Exercise - 12/13/17 0001      Exercises   Exercises  Knee/Hip;Lumbar      Lumbar Exercises: Stretches   Active Hamstring Stretch  Right;Left;3 reps;20 seconds      Lumbar Exercises: Aerobic   Nustep  L2 x 10 min   PT present to discuss progress     Lumbar Exercises: Standing   Row  Strengthening;Power tower;Both;20 reps    Row Limitations  with abdominal bracing 2x10 20#      Lumbar Exercises: Supine   Ab Set  10 reps    Clam  20 reps      Knee/Hip Exercises: Seated   Sit to Sand  20 reps;without UE support      Modalities   Modalities  Cryotherapy      Moist Heat Therapy   Number Minutes Moist Heat  --    Moist Heat Location  --      Cryotherapy   Number Minutes Cryotherapy  15 Minutes    Cryotherapy Location  Lumbar Spine    Type of Cryotherapy  Ice pack      Electrical Stimulation   Electrical Stimulation Location  bil lumbar and Rt gluteals    Electrical Stimulation Action  IFC    Electrical Stimulation Parameters  15 minutes    Electrical Stimulation Goals  Pain               PT Short Term Goals - 12/13/17 1539      PT SHORT TERM GOAL #2   Title  report a 25% reduction in LBP with sitting/standing and walking    Baseline  --    Status  Achieved      PT SHORT TERM GOAL #3   Title  sit for 20 minutes at work without limitaiton due to LBP    Baseline  no problem at home.  I generally sit ~10 minutes max    Status  Achieved      PT SHORT TERM GOAL #4   Title  stand and walk for 20-25 minutes without limitation due to LBP    Baseline  standing is limited in the community due to hard floor-20 minutes    Status  Achieved      PT SHORT TERM GOAL #5   Title  improve endurance to stand and walk for > or = to 15-20 minutes to improve community function    Baseline  20 minutes  Status  Achieved        PT Long Term Goals - 11/20/17 1509      PT LONG TERM GOAL #1   Title  be  independent in advanced HEP    Time  8    Period  Weeks    Status  New    Target Date  01/15/18      PT LONG TERM GOAL #2   Title  reduce FOTO to < or = 39% limitation    Baseline  --    Time  8    Period  Weeks    Status  New    Target Date  01/15/18      PT LONG TERM GOAL #3   Title  tolerate sitting > or = to 25-30 minutes without limitation to improve tolerance for work tasks    Baseline  --    Time  8    Period  Weeks    Status  New    Target Date  01/15/18      PT LONG TERM GOAL #4   Title  verbalize and demonstrate body mechanics modifications and use abdominal bracing for lumbar protection     Baseline  --    Time  8    Period  Weeks    Status  New    Target Date  01/15/18      PT LONG TERM GOAL #5   Title  stand for > or = to 25 minutes for home tasks without limitation    Time  8    Period  Weeks    Status  New    Target Date  01/15/18      Additional Long Term Goals   Additional Long Term Goals  Yes      PT LONG TERM GOAL #6   Title  report a 60% reduction in LBP with ADLs and work tasks    Time  8    Period  Weeks    Status  New            Plan - 12/13/17 1547    Clinical Impression Statement  Pt with 1/10 lumbar pain reported today.  Pt is able to sit for 20 minutes without limitation due to LBP and walk 20 minutes max in the community.  Pt with variable pain levels due to chronic nature of pain.  Pt with core and hip weakness and endurance deficits and continues to benefit from skilled PT for strength, endurance and flexibility and pain management strategies.      Rehab Potential  Good    PT Frequency  2x / week    PT Duration  8 weeks    PT Treatment/Interventions  ADLs/Self Care Home Management;Cryotherapy;Electrical Stimulation;Ultrasound;Moist Heat;Functional mobility training;Therapeutic activities;Therapeutic exercise;Patient/family education;Neuromuscular re-education;Manual techniques;Passive range of motion;Dry needling;Taping    PT  Next Visit Plan  dry needling as needed,  gentle exercise as able, modalities as needed for pain    PT Home Exercise Plan  Access Code: [email or web address]    Consulted and Agree with Plan of Care  Patient       Patient will benefit from skilled therapeutic intervention in order to improve the following deficits and impairments:  Improper body mechanics, Pain, Increased muscle spasms, Postural dysfunction, Decreased activity tolerance, Decreased strength, Decreased range of motion, Impaired flexibility  Visit Diagnosis: Chronic bilateral low back pain with bilateral sciatica  Muscle weakness (generalized)  Cramp and spasm     Problem List Patient  Active Problem List   Diagnosis Date Noted  . Other fatigue 07/18/2017  . Shortness of breath on exertion 07/18/2017  . Type 2 diabetes mellitus with retinopathy, with long-term current use of insulin (HCC) 07/18/2017  . Vitamin D deficiency 07/18/2017  . B12 nutritional deficiency 07/18/2017  . Other specified hypothyroidism 07/18/2017  . Acute pain of right knee 12/22/2016  . Trigger index finger of left hand 09/22/2016  . 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 Diabetes (HCC) 01/08/2014  . Essential hypertension 01/08/2014  . Hyperlipidemia 01/08/2014  . Postoperative anemia due to acute blood loss 11/22/2012  . Hyponatremia 11/06/2012  . OA (osteoarthritis) of knee 11/05/2012  . Villonodular synovitis of knee 04/20/2011     Lorrene Reid, PT 12/13/17 4:15 PM  [facility] [address], [address] [place], [place], [address] Phone: [phone]   Fax:  [phone]  Name: OPHIA SHAMOON MRN: [id number] Date of Birth: 05-Oct-1950

## 2017-12-18 ENCOUNTER — Ambulatory Visit: Payer: 59

## 2017-12-18 DIAGNOSIS — M5442 Lumbago with sciatica, left side: Secondary | ICD-10-CM | POA: Diagnosis not present

## 2017-12-18 DIAGNOSIS — M6281 Muscle weakness (generalized): Secondary | ICD-10-CM | POA: Diagnosis not present

## 2017-12-18 DIAGNOSIS — M5441 Lumbago with sciatica, right side: Principal | ICD-10-CM

## 2017-12-18 DIAGNOSIS — R252 Cramp and spasm: Secondary | ICD-10-CM | POA: Diagnosis not present

## 2017-12-18 DIAGNOSIS — G8929 Other chronic pain: Secondary | ICD-10-CM | POA: Diagnosis not present

## 2017-12-18 NOTE — Therapy (Signed)
[facility] [address] [place], [place], [address] Phone: [phone]   Fax:  [phone]  Physical Therapy Treatment  Patient Details  Name: [name] MRN: [id number] Date of Birth: May 11, 1950 Referring Provider (PT): Marcene Corning, MD   Encounter Date: 12/18/2017  PT End of Session - 12/18/17 1655    Visit Number  9    Date for PT Re-Evaluation  01/15/18    PT Start Time  1616    PT Stop Time  1707    PT Time Calculation (min)  51 min    Activity Tolerance  Patient tolerated treatment well    Behavior During Therapy  [facility] for tasks assessed/performed       Past Medical History:  Diagnosis Date  . A-fib (HCC)   . Anemia    hx of  . Arthritis   . Chest pain   . Constipation   . Depression   . Diabetes mellitus ORAL MEDS  . Diabetic retinopathy (HCC)   . Dyspnea    with minimal exertion-deconditioned  . Dyspnea   . Dysrhythmia    a-fib  . Food allergy   . GERD (gastroesophageal reflux disease)    occasional  . Gout   . History of kidney stones   . Hypercholesteremia   . Hyperlipidemia   . Hypertension   . Hypertensive kidney disease   . Hypothyroidism   . Knee pain, right   . 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 Obesity   . OSA on CPAP    uses CPAP nightly  . Osteoarthritis   . Palpitations   . Pinched nerve in neck   . Pleurisy   . Pneumonia    hx of  . PONV (postoperative nausea and vomiting)    for 3-4 days after general anesthesia  . Sciatica   . Swelling of knee joint, right   . Synovitis of knee RIGHT  . Trigger finger, left    left index  . Vitamin D deficiency     Past Surgical History:  Procedure Laterality Date  . CESAREAN SECTION  X3  . KNEE ARTHROSCOPY  04/20/2011   Procedure: ARTHROSCOPY KNEE;  Surgeon: Loanne Drilling, MD;  Location: [facility];  Service: Orthopedics;  Laterality: Right;  WITH SYNOVECTOMY  . LEFT CARPAL TUNNEL /  LEFT MIDDLE & RING FINGER TRIGGER RELEASE  08-26-2008  . LEFT SHOULDER ARTHROSCOPY W/ DEBRIDEMENT  09-09-2003  . LEFT SHOULDER ARTHROSCOPY/ LEFT THUMB TRIGGER RELEASE  02-22-2005  . PULLEY RELEASE LEFT LONG FINGER  07-14-2009  . RIGHT CARPAL TUNNEL/ RIGHT THUMB TRIGGER RELEASE'S  11-28-2006  . RIGHT SHOULDER ARTHROSCOPY W/ ROTATOR CUFF REPAIR  01-13-2004  . SHOULDER ARTHROSCOPY DISTAL CLAVICLE EXCISION AND OPEN ROTATOR CUFF REPAIR  09-07-2004   LEFT  . SHOULDER ARTHROSCOPY W/ ACROMIAL REPAIR  11-29-2005   LEFT  . TONSILLECTOMY AND ADENOIDECTOMY  child  . TOTAL KNEE ARTHROPLASTY  12-14-2009   RIGHT  . TOTAL KNEE ARTHROPLASTY Left 11/05/2012   Procedure: LEFT TOTAL KNEE ARTHROPLASTY;  Surgeon: Loanne Drilling, MD;  Location: [facility];  Service: Orthopedics;  Laterality: Left;  . TOTAL KNEE REVISION Right 07/29/2016   Procedure: RIGHT KNEE POLY-LINER EXCHANGE;  Surgeon: Kathryne Hitch, MD;  Location: [facility];  Service: Orthopedics;  Laterality: Right;  . TRIGGER FINGER RELEASE Right 02/21/2013   Procedure: RIGHT RING A-1 PULLEY RELEASE    (MINOR PROCEDURE) ;  Surgeon: Wyn Forster.,  MD;  Location: [facility];  Service: Orthopedics;  Laterality: Right;  . TRIGGER FINGER RELEASE Left 09/22/2016   Procedure: RELEASE TRIGGER FINGER LEFT INDEX FINGER;  Surgeon: Kathryne Hitch, MD;  Location[facility];  Service: Orthopedics;  Laterality: Left;  . TRIGGER FINGER RELEASE Right 01/20/2017   Procedure: RELEASE TRIGGER FINGER/A-1 PULLEY RIGHT INDEX FINGER;  Surgeon: Marcene Corning, MD;  Location: [facility];  Service: Orthopedics;  Laterality: Right;    There were no vitals filed for this visit.  Subjective Assessment - 12/18/17 1626    Subjective  I am feeling so much better.      Currently in Pain?  Yes    Pain Score  2     Pain Location  Back    Pain Orientation  Right;Lower    Pain Descriptors / Indicators  Sharp    Pain Onset  More than a month  ago    Pain Frequency  Constant    Aggravating Factors   standing, activity    Pain Relieving Factors  ice/heat, dry needling                       [facility] Adult PT Treatment/Exercise - 12/18/17 0001      Exercises   Exercises  Knee/Hip;Lumbar      Lumbar Exercises: Stretches   Active Hamstring Stretch  Right;Left;3 reps;20 seconds      Lumbar Exercises: Aerobic   Nustep  L2 x 10 min   PT present to discuss progress     Lumbar Exercises: Standing   Row  Strengthening;Power tower;Both;20 reps    Row Limitations  abdominal bracing 20#     Other Standing Lumbar Exercises  standing on black pad: holding red ball diagonals with abdominal bracing 2x10 bil       Lumbar Exercises: Seated   Other Seated Lumbar Exercises  diagonals with red ball with abdominal bracing 2x10      Lumbar Exercises: Supine   Ab Set  10 reps    Clam  20 reps      Knee/Hip Exercises: Seated   Sit to Sand  20 reps;without UE support      Modalities   Modalities  Cryotherapy      Cryotherapy   Number Minutes Cryotherapy  15 Minutes    Cryotherapy Location  Lumbar Spine    Type of Cryotherapy  Ice pack      Electrical Stimulation   Electrical Stimulation Location  bil lumbar and Rt gluteals    Electrical Stimulation Action  IFC    Electrical Stimulation Parameters  15 minutes    Electrical Stimulation Goals  Pain               PT Short Term Goals - 12/13/17 1539      PT SHORT TERM GOAL #2   Title  report a 25% reduction in LBP with sitting/standing and walking    Baseline  --    Status  Achieved      PT SHORT TERM GOAL #3   Title  sit for 20 minutes at work without limitaiton due to LBP    Baseline  no problem at home.  I generally sit ~10 minutes max    Status  Achieved      PT SHORT TERM GOAL #4   Title  stand and walk for 20-25 minutes without limitation due to LBP    Baseline  standing is limited in the community  due to hard floor-20 minutes    Status  Achieved       PT SHORT TERM GOAL #5   Title  improve endurance to stand and walk for > or = to 15-20 minutes to improve community function    Baseline  20 minutes     Status  Achieved        PT Long Term Goals - 12/18/17 1627      PT LONG TERM GOAL #1   Title  be independent in advanced HEP    Time  8    Period  Weeks    Status  On-going      PT LONG TERM GOAL #3   Title  tolerate sitting > or = to 25-30 minutes without limitation to improve tolerance for work tasks    Baseline  20 minutes    Time  8    Period  Weeks    Status  On-going      PT LONG TERM GOAL #4   Title  verbalize and demonstrate body mechanics modifications and use abdominal bracing for lumbar protection     Status  Achieved      PT LONG TERM GOAL #6   Title  report a 60% reduction in LBP with ADLs and work tasks    Time  8    Period  Weeks    Status  On-going              Patient will benefit from skilled therapeutic intervention in order to improve the following deficits and impairments:     Visit Diagnosis: Chronic bilateral low back pain with bilateral sciatica  Muscle weakness (generalized)     Problem List Patient Active Problem List   Diagnosis Date Noted  . Other fatigue 07/18/2017  . Shortness of breath on exertion 07/18/2017  . Type 2 diabetes mellitus with retinopathy, with long-term current use of insulin (HCC) 07/18/2017  . Vitamin D deficiency 07/18/2017  . B12 nutritional deficiency 07/18/2017  . Other specified hypothyroidism 07/18/2017  . Acute pain of right knee 12/22/2016  . Trigger index finger of left hand 09/22/2016  . 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 Diabetes (HCC) 01/08/2014  . Essential hypertension 01/08/2014  . Hyperlipidemia 01/08/2014  . Postoperative anemia due to acute blood loss 11/22/2012  .  Hyponatremia 11/06/2012  . OA (osteoarthritis) of knee 11/05/2012  . Villonodular synovitis of knee 04/20/2011     Lorrene Reid, PT 12/18/17 4:56 PM  [facility] [address] [place], [place], [address] Phone: [phone]   Fax:  [phone]  Name: [name] MRN: [id number] Date of Birth: October 28, 1950

## 2017-12-19 MED FILL — TRULICITY 1.5 MG/0.5 ML PEN: 1.5 | 28 days supply | Qty: 2 | Fill #11

## 2017-12-20 ENCOUNTER — Other Ambulatory Visit (INDEPENDENT_AMBULATORY_CARE_PROVIDER_SITE_OTHER): Payer: Self-pay | Admitting: Physician Assistant

## 2017-12-20 ENCOUNTER — Encounter: Payer: Self-pay | Admitting: Physical Therapy

## 2017-12-20 ENCOUNTER — Ambulatory Visit: Payer: 59 | Admitting: Physical Therapy

## 2017-12-20 ENCOUNTER — Other Ambulatory Visit (INDEPENDENT_AMBULATORY_CARE_PROVIDER_SITE_OTHER): Payer: Self-pay | Admitting: Orthopaedic Surgery

## 2017-12-20 DIAGNOSIS — M6281 Muscle weakness (generalized): Secondary | ICD-10-CM | POA: Diagnosis not present

## 2017-12-20 DIAGNOSIS — G8929 Other chronic pain: Secondary | ICD-10-CM

## 2017-12-20 DIAGNOSIS — M5442 Lumbago with sciatica, left side: Secondary | ICD-10-CM | POA: Diagnosis not present

## 2017-12-20 DIAGNOSIS — R252 Cramp and spasm: Secondary | ICD-10-CM

## 2017-12-20 DIAGNOSIS — M5441 Lumbago with sciatica, right side: Secondary | ICD-10-CM | POA: Diagnosis not present

## 2017-12-20 MED FILL — INDOMETHACIN 50 MG CAPSULE: 50 | 6 days supply | Qty: 20 | Fill #0

## 2017-12-20 MED FILL — METHOCARBAMOL 500 MG TABLET: 500 | 15 days supply | Qty: 60 | Fill #0

## 2017-12-20 MED FILL — HYDROCODON-APAP 5-325: 5-325 | 7 days supply | Qty: 30 | Fill #0

## 2017-12-20 NOTE — Therapy (Signed)
[facility] [address], [address] [place], [place], [address] Phone: [phone]   Fax:  [phone]  Physical Therapy Treatment  Patient Details  Name: [name] MRN: [id number] Date of Birth: 06-21-1950 Referring Provider (PT): Marcene Corning, MD   Encounter Date: 12/20/2017  PT End of Session - 12/20/17 1622    Visit Number  10    Date for PT Re-Evaluation  01/15/18    PT Start Time  1619    PT Stop Time  1700    PT Time Calculation (min)  41 min    Activity Tolerance  Patient tolerated treatment well    Behavior During Therapy  [facility] for tasks assessed/performed       Past Medical History:  Diagnosis Date  . A-fib (HCC)   . Anemia    hx of  . Arthritis   . Chest pain   . Constipation   . Depression   . Diabetes mellitus ORAL MEDS  . Diabetic retinopathy (HCC)   . Dyspnea    with minimal exertion-deconditioned  . Dyspnea   . Dysrhythmia    a-fib  . Food allergy   . GERD (gastroesophageal reflux disease)    occasional  . Gout   . History of kidney stones   . Hypercholesteremia   . Hyperlipidemia   . Hypertension   . Hypertensive kidney disease   . Hypothyroidism   . Knee pain, right   . 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 Obesity   . OSA on CPAP    uses CPAP nightly  . Osteoarthritis   . Palpitations   . Pinched nerve in neck   . Pleurisy   . Pneumonia    hx of  . PONV (postoperative nausea and vomiting)    for 3-4 days after general anesthesia  . Sciatica   . Swelling of knee joint, right   . Synovitis of knee RIGHT  . Trigger finger, left    left index  . Vitamin D deficiency     Past Surgical History:  Procedure Laterality Date  . CESAREAN SECTION  X3  . KNEE ARTHROSCOPY  04/20/2011   Procedure: ARTHROSCOPY KNEE;  Surgeon: Loanne Drilling, MD;  Location: [facility];  Service: Orthopedics;  Laterality: Right;  WITH SYNOVECTOMY  . LEFT CARPAL TUNNEL /  LEFT MIDDLE & RING FINGER TRIGGER RELEASE  08-26-2008  . LEFT SHOULDER ARTHROSCOPY W/ DEBRIDEMENT  09-09-2003  . LEFT SHOULDER ARTHROSCOPY/ LEFT THUMB TRIGGER RELEASE  02-22-2005  . PULLEY RELEASE LEFT LONG FINGER  07-14-2009  . RIGHT CARPAL TUNNEL/ RIGHT THUMB TRIGGER RELEASE'S  11-28-2006  . RIGHT SHOULDER ARTHROSCOPY W/ ROTATOR CUFF REPAIR  01-13-2004  . SHOULDER ARTHROSCOPY DISTAL CLAVICLE EXCISION AND OPEN ROTATOR CUFF REPAIR  09-07-2004   LEFT  . SHOULDER ARTHROSCOPY W/ ACROMIAL REPAIR  11-29-2005   LEFT  . TONSILLECTOMY AND ADENOIDECTOMY  child  . TOTAL KNEE ARTHROPLASTY  12-14-2009   RIGHT  . TOTAL KNEE ARTHROPLASTY Left 11/05/2012   Procedure: LEFT TOTAL KNEE ARTHROPLASTY;  Surgeon: Loanne Drilling, MD;  Location: [facility];  Service: Orthopedics;  Laterality: Left;  . TOTAL KNEE REVISION Right 07/29/2016   Procedure: RIGHT KNEE POLY-LINER EXCHANGE;  Surgeon: Kathryne Hitch, MD;  Location: [facility];  Service: Orthopedics;  Laterality: Right;  . TRIGGER FINGER RELEASE Right 02/21/2013   Procedure: RIGHT RING A-1 PULLEY RELEASE    (MINOR PROCEDURE) ;  Surgeon: Wyn Forster.,  MD;  Location: [facility];  Service: Orthopedics;  Laterality: Right;  . TRIGGER FINGER RELEASE Left 09/22/2016   Procedure: RELEASE TRIGGER FINGER LEFT INDEX FINGER;  Surgeon: Kathryne Hitch, MD;  Location: [facility];  Service: Orthopedics;  Laterality: Left;  . TRIGGER FINGER RELEASE Right 01/20/2017   Procedure: RELEASE TRIGGER FINGER/A-1 PULLEY RIGHT INDEX FINGER;  Surgeon: Marcene Corning, MD;  Location: [facility];  Service: Orthopedics;  Laterality: Right;    There were no vitals filed for this visit.  Subjective Assessment - 12/20/17 1623    Subjective  I was really sore after last session, but it went away yesterday. I have trouble pushing heavy pts in the wheelchair.     Pertinent History  diabetes, HTN, obesity, widespread OA    Limitations   Sitting;Standing;Walking    Diagnostic tests  x-ray: Spondylosisthesis L4/5    Currently in Pain?  Yes    Pain Score  2     Pain Location  Back    Pain Orientation  Right;Lower    Pain Descriptors / Indicators  Sore    Multiple Pain Sites  No                       [facility] Adult PT Treatment/Exercise - 12/20/17 0001      Lumbar Exercises: Stretches   Active Hamstring Stretch  Right;Left;3 reps;20 seconds      Lumbar Exercises: Aerobic   Nustep  L2-3 x 10 min   PTA present     Lumbar Exercises: Standing   Row  Strengthening;Power tower;Both;20 reps      Lumbar Exercises: Seated   Other Seated Lumbar Exercises  diagonals with red ball with abdominal bracing 2x10      Lumbar Exercises: Supine   Ab Set  --   ball/ glutes/TA concurrent 3 sec hold 10x   Clam  20 reps   yellow band added      Knee/Hip Exercises: Seated   Sit to Sand  20 reps;without UE support   pt held red ball today     Cryotherapy   Number Minutes Cryotherapy  --   concurrent with supine exercises about 10 min     Electrical Stimulation   Electrical Stimulation Location  Declined today               PT Short Term Goals - 12/13/17 1539      PT SHORT TERM GOAL #2   Title  report a 25% reduction in LBP with sitting/standing and walking    Baseline  --    Status  Achieved      PT SHORT TERM GOAL #3   Title  sit for 20 minutes at work without limitaiton due to LBP    Baseline  no problem at home.  I generally sit ~10 minutes max    Status  Achieved      PT SHORT TERM GOAL #4   Title  stand and walk for 20-25 minutes without limitation due to LBP    Baseline  standing is limited in the community due to hard floor-20 minutes    Status  Achieved      PT SHORT TERM GOAL #5   Title  improve endurance to stand and walk for > or = to 15-20 minutes to improve community function    Baseline  20 minutes     Status  Achieved        PT Long Term  Goals - 12/18/17 1627      PT LONG  TERM GOAL #1   Title  be independent in advanced HEP    Time  8    Period  Weeks    Status  On-going      PT LONG TERM GOAL #3   Title  tolerate sitting > or = to 25-30 minutes without limitation to improve tolerance for work tasks    Baseline  20 minutes    Time  8    Period  Weeks    Status  On-going      PT LONG TERM GOAL #4   Title  verbalize and demonstrate body mechanics modifications and use abdominal bracing for lumbar protection     Status  Achieved      PT LONG TERM GOAL #6   Title  report a 60% reduction in LBP with ADLs and work tasks    Time  8    Period  Weeks    Status  On-going            Plan - 12/20/17 1701    Clinical Impression Statement  Pt reporting she stills feels good/better overall but today she did push a few wheel chairs that made her slightly more sore in back. She completed all her supine and seated exercises without exacerbating her pain and ambulated out of the clinic with no limp ( had limp walking in today). Added red ball to sit to stand and yellow band to clamshell sto increase strength.     Rehab Potential  Good    PT Frequency  2x / week    PT Duration  8 weeks    PT Treatment/Interventions  ADLs/Self Care Home Management;Cryotherapy;Electrical Stimulation;Ultrasound;Moist Heat;Functional mobility training;Therapeutic activities;Therapeutic exercise;Patient/family education;Neuromuscular re-education;Manual techniques;Passive range of motion;Dry needling;Taping    PT Next Visit Plan  dry needling as needed,  gentle exercise as able, modalities as needed for pain    PT Home Exercise Plan  Access Code: [email or web address]    Consulted and Agree with Plan of Care  Patient       Patient will benefit from skilled therapeutic intervention in order to improve the following deficits and impairments:  Improper body mechanics, Pain, Increased muscle spasms, Postural dysfunction, Decreased activity tolerance, Decreased strength, Decreased range of motion,  Impaired flexibility  Visit Diagnosis: Chronic bilateral low back pain with bilateral sciatica  Muscle weakness (generalized)  Cramp and spasm     Problem List Patient Active Problem List   Diagnosis Date Noted  . Other fatigue 07/18/2017  . Shortness of breath on exertion 07/18/2017  . Type 2 diabetes mellitus with retinopathy, with long-term current use of insulin (HCC) 07/18/2017  . Vitamin D deficiency 07/18/2017  . B12 nutritional deficiency 07/18/2017  . Other specified hypothyroidism 07/18/2017  . Acute pain of right knee 12/22/2016  . Trigger index finger of left hand 09/22/2016  . 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 Diabetes (HCC) 01/08/2014  . Essential hypertension 01/08/2014  . Hyperlipidemia 01/08/2014  . Postoperative anemia due to acute blood loss 11/22/2012  . Hyponatremia 11/06/2012  . OA (osteoarthritis) of knee 11/05/2012  . Villonodular synovitis of knee 04/20/2011    Geovany Trudo, PTA 12/20/2017, 5:03 PM  [facility] [address] [place], [place], [address] Phone: [phone]   Fax:  [phone]  Name: [name] MRN: [id number] Date of Birth: 1950-06-12

## 2017-12-20 NOTE — Telephone Encounter (Signed)
Please advise 

## 2017-12-25 ENCOUNTER — Encounter: Payer: Self-pay | Admitting: Physical Therapy

## 2017-12-25 ENCOUNTER — Ambulatory Visit: Payer: 59 | Admitting: Physical Therapy

## 2017-12-25 DIAGNOSIS — G8929 Other chronic pain: Secondary | ICD-10-CM

## 2017-12-25 DIAGNOSIS — R252 Cramp and spasm: Secondary | ICD-10-CM | POA: Diagnosis not present

## 2017-12-25 DIAGNOSIS — M5442 Lumbago with sciatica, left side: Principal | ICD-10-CM

## 2017-12-25 DIAGNOSIS — M6281 Muscle weakness (generalized): Secondary | ICD-10-CM | POA: Diagnosis not present

## 2017-12-25 DIAGNOSIS — M5441 Lumbago with sciatica, right side: Principal | ICD-10-CM

## 2017-12-25 NOTE — Therapy (Signed)
[facility] [address], [address] [place], [place], [address] Phone: [phone]   Fax:  [phone]  Physical Therapy Treatment  Patient Details  Name: [name] MRN: [id number] Date of Birth: October 30, 1950 Referring Provider (PT): Marcene Corning, MD   Encounter Date: 12/25/2017  PT End of Session - 12/25/17 1448    Visit Number  11    Date for PT Re-Evaluation  01/15/18    PT Start Time  1447    PT Stop Time  1525    PT Time Calculation (min)  38 min    Activity Tolerance  Patient tolerated treatment well    Behavior During Therapy  [facility] for tasks assessed/performed       Past Medical History:  Diagnosis Date  . A-fib (HCC)   . Anemia    hx of  . Arthritis   . Chest pain   . Constipation   . Depression   . Diabetes mellitus ORAL MEDS  . Diabetic retinopathy (HCC)   . Dyspnea    with minimal exertion-deconditioned  . Dyspnea   . Dysrhythmia    a-fib  . Food allergy   . GERD (gastroesophageal reflux disease)    occasional  . Gout   . History of kidney stones   . Hypercholesteremia   . Hyperlipidemia   . Hypertension   . Hypertensive kidney disease   . Hypothyroidism   . Knee pain, right   . 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 Obesity   . OSA on CPAP    uses CPAP nightly  . Osteoarthritis   . Palpitations   . Pinched nerve in neck   . Pleurisy   . Pneumonia    hx of  . PONV (postoperative nausea and vomiting)    for 3-4 days after general anesthesia  . Sciatica   . Swelling of knee joint, right   . Synovitis of knee RIGHT  . Trigger finger, left    left index  . Vitamin D deficiency     Past Surgical History:  Procedure Laterality Date  . CESAREAN SECTION  X3  . KNEE ARTHROSCOPY  04/20/2011   Procedure: ARTHROSCOPY KNEE;  Surgeon: Loanne Drilling, MD;  Location: [facility];  Service: Orthopedics;  Laterality: Right;  WITH SYNOVECTOMY  . LEFT CARPAL TUNNEL /  LEFT MIDDLE & RING FINGER TRIGGER RELEASE  08-26-2008  . LEFT SHOULDER ARTHROSCOPY W/ DEBRIDEMENT  09-09-2003  . LEFT SHOULDER ARTHROSCOPY/ LEFT THUMB TRIGGER RELEASE  02-22-2005  . PULLEY RELEASE LEFT LONG FINGER  07-14-2009  . RIGHT CARPAL TUNNEL/ RIGHT THUMB TRIGGER RELEASE'S  11-28-2006  . RIGHT SHOULDER ARTHROSCOPY W/ ROTATOR CUFF REPAIR  01-13-2004  . SHOULDER ARTHROSCOPY DISTAL CLAVICLE EXCISION AND OPEN ROTATOR CUFF REPAIR  09-07-2004   LEFT  . SHOULDER ARTHROSCOPY W/ ACROMIAL REPAIR  11-29-2005   LEFT  . TONSILLECTOMY AND ADENOIDECTOMY  child  . TOTAL KNEE ARTHROPLASTY  12-14-2009   RIGHT  . TOTAL KNEE ARTHROPLASTY Left 11/05/2012   Procedure: LEFT TOTAL KNEE ARTHROPLASTY;  Surgeon: Loanne Drilling, MD;  Location: [facility];  Service: Orthopedics;  Laterality: Left;  . TOTAL KNEE REVISION Right 07/29/2016   Procedure: RIGHT KNEE POLY-LINER EXCHANGE;  Surgeon: Kathryne Hitch, MD;  Location: [facility];  Service: Orthopedics;  Laterality: Right;  . TRIGGER FINGER RELEASE Right 02/21/2013   Procedure: RIGHT RING A-1 PULLEY RELEASE    (MINOR PROCEDURE) ;  Surgeon: Wyn Forster.,  MD;  Location: [facility];  Service: Orthopedics;  Laterality: Right;  . TRIGGER FINGER RELEASE Left 09/22/2016   Procedure: RELEASE TRIGGER FINGER LEFT INDEX FINGER;  Surgeon: Kathryne Hitch, MD;  Location: [facility];  Service: Orthopedics;  Laterality: Left;  . TRIGGER FINGER RELEASE Right 01/20/2017   Procedure: RELEASE TRIGGER FINGER/A-1 PULLEY RIGHT INDEX FINGER;  Surgeon: Marcene Corning, MD;  Location: [facility];  Service: Orthopedics;  Laterality: Right;    There were no vitals filed for this visit.  Subjective Assessment - 12/25/17 1449    Subjective  I am even better than last week.     Pertinent History  diabetes, HTN, obesity, widespread OA    Currently in Pain?  Yes    Pain Score  2     Pain Location  Back   tingling down back of LT leg to knee   Pain  Orientation  Left    Pain Descriptors / Indicators  Tingling    Aggravating Factors   Not moving enough    Pain Relieving Factors  maybe walking more    Multiple Pain Sites  No                       [facility] Adult PT Treatment/Exercise - 12/25/17 0001      Lumbar Exercises: Stretches   Active Hamstring Stretch  Right;Left;3 reps;20 seconds    Lower Trunk Rotation  2 reps;20 seconds      Lumbar Exercises: Aerobic   Nustep  L2-3 x 10 min   PTA present     Lumbar Exercises: Standing   Row  Strengthening;Power tower;Both;20 reps   20#   Other Standing Lumbar Exercises  reverse walking 7x 20# with abdominal bracing      Lumbar Exercises: Seated   Other Seated Lumbar Exercises  diagonals with red ball with abdominal bracing 2x15   sitting on black pad for core activation     Lumbar Exercises: Supine   Clam  --   yellow band: 2x15 ice to LB concurrent     Cryotherapy   Number Minutes Cryotherapy  --   concuurent with supine exercises: about 10-15 min to lumbar              PT Short Term Goals - 12/13/17 1539      PT SHORT TERM GOAL #2   Title  report a 25% reduction in LBP with sitting/standing and walking    Baseline  --    Status  Achieved      PT SHORT TERM GOAL #3   Title  sit for 20 minutes at work without limitaiton due to LBP    Baseline  no problem at home.  I generally sit ~10 minutes max    Status  Achieved      PT SHORT TERM GOAL #4   Title  stand and walk for 20-25 minutes without limitation due to LBP    Baseline  standing is limited in the community due to hard floor-20 minutes    Status  Achieved      PT SHORT TERM GOAL #5   Title  improve endurance to stand and walk for > or = to 15-20 minutes to improve community function    Baseline  20 minutes     Status  Achieved        PT Long Term Goals - 12/18/17 1627      PT LONG TERM GOAL #1  Title  be independent in advanced HEP    Time  8    Period  Weeks    Status  On-going       PT LONG TERM GOAL #3   Title  tolerate sitting > or = to 25-30 minutes without limitation to improve tolerance for work tasks    Baseline  20 minutes    Time  8    Period  Weeks    Status  On-going      PT LONG TERM GOAL #4   Title  verbalize and demonstrate body mechanics modifications and use abdominal bracing for lumbar protection     Status  Achieved      PT LONG TERM GOAL #6   Title  report a 60% reduction in LBP with ADLs and work tasks    Time  8    Period  Weeks    Status  On-going            Plan - 12/25/17 1449    Clinical Impression Statement  Pt reports that this week she feels even better than last. She feels the more she walks/moves the better she feels. Increased repetitions of all her exercises to work towards improving her functional activity tolerance.     Rehab Potential  Good    PT Frequency  2x / week    PT Duration  8 weeks    PT Treatment/Interventions  ADLs/Self Care Home Management;Cryotherapy;Electrical Stimulation;Ultrasound;Moist Heat;Functional mobility training;Therapeutic activities;Therapeutic exercise;Patient/family education;Neuromuscular re-education;Manual techniques;Passive range of motion;Dry needling;Taping    PT Next Visit Plan  dry needling as needed,  gentle exercise as able, modalities as needed for pain    PT Home Exercise Plan  Access Code: [email or web address]    Consulted and Agree with Plan of Care  Patient       Patient will benefit from skilled therapeutic intervention in order to improve the following deficits and impairments:  Improper body mechanics, Pain, Increased muscle spasms, Postural dysfunction, Decreased activity tolerance, Decreased strength, Decreased range of motion, Impaired flexibility  Visit Diagnosis: Chronic bilateral low back pain with bilateral sciatica  Muscle weakness (generalized)     Problem List Patient Active Problem List   Diagnosis Date Noted  . Other fatigue 07/18/2017  . Shortness of breath on  exertion 07/18/2017  . Type 2 diabetes mellitus with retinopathy, with long-term current use of insulin (HCC) 07/18/2017  . Vitamin D deficiency 07/18/2017  . B12 nutritional deficiency 07/18/2017  . Other specified hypothyroidism 07/18/2017  . Acute pain of right knee 12/22/2016  . Trigger index finger of left hand 09/22/2016  . 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 Diabetes (HCC) 01/08/2014  . Essential hypertension 01/08/2014  . Hyperlipidemia 01/08/2014  . Postoperative anemia due to acute blood loss 11/22/2012  . Hyponatremia 11/06/2012  . OA (osteoarthritis) of knee 11/05/2012  . Villonodular synovitis of knee 04/20/2011    Bert Givans, PTA 12/25/2017, 3:23 PM  [facility] [address] [place], [place], [address] Phone: [phone]   Fax:  [phone]  Name: [name] MRN: [id number] Date of Birth: [birth year]

## 2017-12-26 ENCOUNTER — Ambulatory Visit (INDEPENDENT_AMBULATORY_CARE_PROVIDER_SITE_OTHER): Payer: 59 | Admitting: Family Medicine

## 2017-12-27 ENCOUNTER — Encounter: Payer: Self-pay | Admitting: Physical Therapy

## 2017-12-27 ENCOUNTER — Ambulatory Visit: Payer: 59 | Admitting: Physical Therapy

## 2017-12-27 DIAGNOSIS — M5442 Lumbago with sciatica, left side: Secondary | ICD-10-CM | POA: Diagnosis not present

## 2017-12-27 DIAGNOSIS — M6281 Muscle weakness (generalized): Secondary | ICD-10-CM | POA: Diagnosis not present

## 2017-12-27 DIAGNOSIS — R252 Cramp and spasm: Secondary | ICD-10-CM | POA: Diagnosis not present

## 2017-12-27 DIAGNOSIS — M5441 Lumbago with sciatica, right side: Principal | ICD-10-CM

## 2017-12-27 DIAGNOSIS — G8929 Other chronic pain: Secondary | ICD-10-CM | POA: Diagnosis not present

## 2017-12-27 NOTE — Therapy (Signed)
[facility] [address] [place], [place], [address] Phone: [phone]   Fax:  [phone]  Physical Therapy Treatment  Patient Details  Name: [name] MRN: [id number] Date of Birth: Jul 08, 1950 Referring Provider (PT): Marcene Corning, MD   Encounter Date: 12/27/2017  PT End of Session - 12/27/17 1450    Visit Number  12    Date for PT Re-Evaluation  01/15/18    PT Start Time  1450    PT Stop Time  1540    PT Time Calculation (min)  50 min    Activity Tolerance  Patient tolerated treatment well    Behavior During Therapy  [facility] for tasks assessed/performed       Past Medical History:  Diagnosis Date  . A-fib (HCC)   . Anemia    hx of  . Arthritis   . Chest pain   . Constipation   . Depression   . Diabetes mellitus ORAL MEDS  . Diabetic retinopathy (HCC)   . Dyspnea    with minimal exertion-deconditioned  . Dyspnea   . Dysrhythmia    a-fib  . Food allergy   . GERD (gastroesophageal reflux disease)    occasional  . Gout   . History of kidney stones   . Hypercholesteremia   . Hyperlipidemia   . Hypertension   . Hypertensive kidney disease   . Hypothyroidism   . Knee pain, right   . 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 Obesity   . OSA on CPAP    uses CPAP nightly  . Osteoarthritis   . Palpitations   . Pinched nerve in neck   . Pleurisy   . Pneumonia    hx of  . PONV (postoperative nausea and vomiting)    for 3-4 days after general anesthesia  . Sciatica   . Swelling of knee joint, right   . Synovitis of knee RIGHT  . Trigger finger, left    left index  . Vitamin D deficiency     Past Surgical History:  Procedure Laterality Date  . CESAREAN SECTION  X3  . KNEE ARTHROSCOPY  04/20/2011   Procedure: ARTHROSCOPY KNEE;  Surgeon: Loanne Drilling, MD;  Location: [facility];  Service: Orthopedics;  Laterality: Right;  WITH SYNOVECTOMY  . LEFT CARPAL TUNNEL /  LEFT MIDDLE & RING FINGER TRIGGER RELEASE  08-26-2008  . LEFT SHOULDER ARTHROSCOPY W/ DEBRIDEMENT  09-09-2003  . LEFT SHOULDER ARTHROSCOPY/ LEFT THUMB TRIGGER RELEASE  02-22-2005  . PULLEY RELEASE LEFT LONG FINGER  07-14-2009  . RIGHT CARPAL TUNNEL/ RIGHT THUMB TRIGGER RELEASE'S  11-28-2006  . RIGHT SHOULDER ARTHROSCOPY W/ ROTATOR CUFF REPAIR  01-13-2004  . SHOULDER ARTHROSCOPY DISTAL CLAVICLE EXCISION AND OPEN ROTATOR CUFF REPAIR  09-07-2004   LEFT  . SHOULDER ARTHROSCOPY W/ ACROMIAL REPAIR  11-29-2005   LEFT  . TONSILLECTOMY AND ADENOIDECTOMY  child  . TOTAL KNEE ARTHROPLASTY  12-14-2009   RIGHT  . TOTAL KNEE ARTHROPLASTY Left 11/05/2012   Procedure: LEFT TOTAL KNEE ARTHROPLASTY;  Surgeon: Loanne Drilling, MD;  Location: [facility];  Service: Orthopedics;  Laterality: Left;  . TOTAL KNEE REVISION Right 07/29/2016   Procedure: RIGHT KNEE POLY-LINER EXCHANGE;  Surgeon: Kathryne Hitch, MD;  Location: [facility];  Service: Orthopedics;  Laterality: Right;  . TRIGGER FINGER RELEASE Right 02/21/2013   Procedure: RIGHT RING A-1 PULLEY RELEASE    (MINOR PROCEDURE) ;  Surgeon: Wyn Forster.,  MD;  Location: [facility];  Service: Orthopedics;  Laterality: Right;  . TRIGGER FINGER RELEASE Left 09/22/2016   Procedure: RELEASE TRIGGER FINGER LEFT INDEX FINGER;  Surgeon: Kathryne Hitch, MD;  Location: [facility];  Service: Orthopedics;  Laterality: Left;  . TRIGGER FINGER RELEASE Right 01/20/2017   Procedure: RELEASE TRIGGER FINGER/A-1 PULLEY RIGHT INDEX FINGER;  Surgeon: Marcene Corning, MD;  Location: [facility];  Service: Orthopedics;  Laterality: Right;    There were no vitals filed for this visit.  Subjective Assessment - 12/27/17 1452    Subjective  I am very tired today. I walked a lot today at work. Did fine after last workout.     Currently in Pain?  Yes    Pain Score  2     Pain Location  Back    Pain Orientation  Lower    Pain Descriptors /  Indicators  Sore    Multiple Pain Sites  No                       [facility] Adult PT Treatment/Exercise - 12/27/17 0001      Lumbar Exercises: Stretches   Active Hamstring Stretch  Right;Left;3 reps;20 seconds    Pelvic Tilt  --   Gastroc on slant board 2x 30 sec     Lumbar Exercises: Aerobic   Nustep  L2-3 x 10 min   PTA present     Lumbar Exercises: Higher education careers adviser;Both    Row Limitations  2x15, 20#    Other Standing Lumbar Exercises  reverse walking 10# 20# with abdominal bracing   6 inch forward step 15xbil     Lumbar Exercises: Seated   Other Seated Lumbar Exercises  diagonals with red ball with abdominal bracing 2x15   sitting on black pad for core activation     Cryotherapy   Number Minutes Cryotherapy  10 Minutes   post session in hooklying   Cryotherapy Location  Lumbar Spine    Type of Cryotherapy  Ice pack               PT Short Term Goals - 12/13/17 1539      PT SHORT TERM GOAL #2   Title  report a 25% reduction in LBP with sitting/standing and walking    Baseline  --    Status  Achieved      PT SHORT TERM GOAL #3   Title  sit for 20 minutes at work without limitaiton due to LBP    Baseline  no problem at home.  I generally sit ~10 minutes max    Status  Achieved      PT SHORT TERM GOAL #4   Title  stand and walk for 20-25 minutes without limitation due to LBP    Baseline  standing is limited in the community due to hard floor-20 minutes    Status  Achieved      PT SHORT TERM GOAL #5   Title  improve endurance to stand and walk for > or = to 15-20 minutes to improve community function    Baseline  20 minutes     Status  Achieved        PT Long Term Goals - 12/18/17 1627      PT LONG TERM GOAL #1   Title  be independent in advanced HEP    Time  8    Period  Weeks  Status  On-going      PT LONG TERM GOAL #3   Title  tolerate sitting > or = to 25-30 minutes without limitation to improve tolerance for work  tasks    Baseline  20 minutes    Time  8    Period  Weeks    Status  On-going      PT LONG TERM GOAL #4   Title  verbalize and demonstrate body mechanics modifications and use abdominal bracing for lumbar protection     Status  Achieved      PT LONG TERM GOAL #6   Title  report a 60% reduction in LBP with ADLs and work tasks    Time  8    Period  Weeks    Status  On-going            Plan - 12/27/17 1451    Clinical Impression Statement  Pt continues to do well withher pain reduction. She felt she did a lot of walking at work today with mainly fatigue and no pain. Session was performed in standing today to accent her back strength in standing. No pain complaints. Did use ice after session. Discussed extensively with pt what kind of exercise she will continue with once PT is finiished, PTa suggested pt look into water exercise. Pt was favorable to this idea.     Rehab Potential  Good    PT Frequency  2x / week    PT Duration  8 weeks    PT Treatment/Interventions  ADLs/Self Care Home Management;Cryotherapy;Electrical Stimulation;Ultrasound;Moist Heat;Functional mobility training;Therapeutic activities;Therapeutic exercise;Patient/family education;Neuromuscular re-education;Manual techniques;Passive range of motion;Dry needling;Taping    PT Next Visit Plan  dry needling as needed,  gentle exercise as able, modalities as needed for pain    PT Home Exercise Plan  Access Code: [email or web address]    Consulted and Agree with Plan of Care  Patient       Patient will benefit from skilled therapeutic intervention in order to improve the following deficits and impairments:  Improper body mechanics, Pain, Increased muscle spasms, Postural dysfunction, Decreased activity tolerance, Decreased strength, Decreased range of motion, Impaired flexibility  Visit Diagnosis: Chronic bilateral low back pain with bilateral sciatica  Muscle weakness (generalized)     Problem List Patient Active Problem  List   Diagnosis Date Noted  . Other fatigue 07/18/2017  . Shortness of breath on exertion 07/18/2017  . Type 2 diabetes mellitus with retinopathy, with long-term current use of insulin (HCC) 07/18/2017  . Vitamin D deficiency 07/18/2017  . B12 nutritional deficiency 07/18/2017  . Other specified hypothyroidism 07/18/2017  . Acute pain of right knee 12/22/2016  . Trigger index finger of left hand 09/22/2016  . 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 Diabetes (HCC) 01/08/2014  . Essential hypertension 01/08/2014  . Hyperlipidemia 01/08/2014  . Postoperative anemia due to acute blood loss 11/22/2012  . Hyponatremia 11/06/2012  . OA (osteoarthritis) of knee 11/05/2012  . Villonodular synovitis of knee 04/20/2011    Keirstan Iannello, PTA 12/27/2017, 3:31 PM  [facility] [address], [address] [place], [place], [address] Phone: [phone]   Fax:  [phone]  Name: AAMINAH FORRESTER MRN: [id number] Date of Birth: Aug 24, 1950

## 2018-01-01 ENCOUNTER — Ambulatory Visit: Payer: 59

## 2018-01-01 DIAGNOSIS — R252 Cramp and spasm: Secondary | ICD-10-CM

## 2018-01-01 DIAGNOSIS — M5441 Lumbago with sciatica, right side: Secondary | ICD-10-CM | POA: Diagnosis not present

## 2018-01-01 DIAGNOSIS — M5442 Lumbago with sciatica, left side: Secondary | ICD-10-CM | POA: Diagnosis not present

## 2018-01-01 DIAGNOSIS — M6281 Muscle weakness (generalized): Secondary | ICD-10-CM | POA: Diagnosis not present

## 2018-01-01 DIAGNOSIS — G8929 Other chronic pain: Secondary | ICD-10-CM

## 2018-01-01 NOTE — Patient Instructions (Signed)
Access Code: [email or web address]  URL: [email or web address]  Date: 01/01/2018  Prepared by: Lorrene Reid   Exercises   Scapular Retraction with Resistance - 10 reps - 2 sets - 2x daily - 7x weekly  Shoulder extension with resistance - Neutral - 10 reps - 2 sets - 2x daily - 7x weekly  Seated Correct Posture - 10 reps - 3 sets - 1x daily - 7x weekly  Seated Shoulder Horizontal Abduction with Resistance - 10 reps - 2 sets - 2x daily - 7x weekly  Standing Shoulder External Rotation with Resistance - 10 reps - 1 sets - 2x daily - 7x weekly

## 2018-01-01 NOTE — Therapy (Signed)
[facility] [address], [place], [place], [address] Phone: [phone]   Fax:  [phone]  Physical Therapy Treatment  Patient Details  Name: [name] MRN: [id number] Date of Birth: 02-24-1951 Referring Provider (PT): Melrose Nakayama, MD   Encounter Date: 01/01/2018  PT End of Session - 01/01/18 1528    Visit Number  13    Date for PT Re-Evaluation  01/15/18    PT Start Time  8315    PT Stop Time  1534    PT Time Calculation (min)  49 min    Activity Tolerance  Patient tolerated treatment well    Behavior During Therapy  [facility] for tasks assessed/performed       Past Medical History:  Diagnosis Date  . A-fib ([place])   . Anemia    hx of  . Arthritis   . Chest pain   . Constipation   . Depression   . Diabetes mellitus [facility]  . Diabetic retinopathy ([place])   . Dyspnea    with minimal exertion-deconditioned  . Dyspnea   . Dysrhythmia    a-fib  . Food allergy   . GERD (gastroesophageal reflux disease)    occasional  . Gout   . History of kidney stones   . Hypercholesteremia   . Hyperlipidemia   . Hypertension   . Hypertensive kidney disease   . Hypothyroidism   . Knee pain, right   . 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 Obesity   . OSA on CPAP    uses CPAP nightly  . Osteoarthritis   . Palpitations   . Pinched nerve in neck   . Pleurisy   . Pneumonia    hx of  . PONV (postoperative nausea and vomiting)    for 3-4 days after general anesthesia  . Sciatica   . Swelling of knee joint, right   . Synovitis of knee RIGHT  . Trigger finger, left    left index  . Vitamin D deficiency     Past Surgical History:  Procedure Laterality Date  . CESAREAN SECTION  X3  . KNEE ARTHROSCOPY  04/20/2011   Procedure: ARTHROSCOPY KNEE;  Surgeon: Gearlean Alf, MD;  Location: [facility];  Service: Orthopedics;  Laterality: Right;  WITH SYNOVECTOMY  . LEFT CARPAL TUNNEL /  LEFT MIDDLE & RING FINGER TRIGGER RELEASE  08-26-2008  . LEFT SHOULDER ARTHROSCOPY W/ DEBRIDEMENT  09-09-2003  . LEFT SHOULDER ARTHROSCOPY/ LEFT THUMB TRIGGER RELEASE  02-22-2005  . PULLEY RELEASE LEFT LONG FINGER  07-14-2009  . RIGHT CARPAL TUNNEL/ RIGHT THUMB TRIGGER RELEASE'S  11-28-2006  . RIGHT SHOULDER ARTHROSCOPY W/ ROTATOR CUFF REPAIR  01-13-2004  . SHOULDER ARTHROSCOPY DISTAL CLAVICLE EXCISION AND OPEN ROTATOR CUFF REPAIR  09-07-2004   LEFT  . SHOULDER ARTHROSCOPY W/ ACROMIAL REPAIR  11-29-2005   LEFT  . TONSILLECTOMY AND ADENOIDECTOMY  child  . TOTAL KNEE ARTHROPLASTY  12-14-2009   RIGHT  . TOTAL KNEE ARTHROPLASTY Left 11/05/2012   Procedure: LEFT TOTAL KNEE ARTHROPLASTY;  Surgeon: Gearlean Alf, MD;  Location: [facility];  Service: Orthopedics;  Laterality: Left;  . TOTAL KNEE REVISION Right 07/29/2016   Procedure: RIGHT KNEE POLY-LINER EXCHANGE;  Surgeon: Mcarthur Rossetti, MD;  Location: [facility];  Service: Orthopedics;  Laterality: Right;  . TRIGGER FINGER RELEASE Right 02/21/2013   Procedure: RIGHT RING A-1 PULLEY RELEASE    (MINOR PROCEDURE) ;  Surgeon: Cammie Sickle.,  MD;  Location: [place];  Service: Orthopedics;  Laterality: Right;  . TRIGGER FINGER RELEASE Left 09/22/2016   Procedure: RELEASE TRIGGER FINGER LEFT INDEX FINGER;  Surgeon: Mcarthur Rossetti, MD;  Location: [place];  Service: Orthopedics;  Laterality: Left;  . TRIGGER FINGER RELEASE Right 01/20/2017   Procedure: RELEASE TRIGGER FINGER/A-1 PULLEY RIGHT INDEX FINGER;  Surgeon: Melrose Nakayama, MD;  Location: [place];  Service: Orthopedics;  Laterality: Right;    There were no vitals filed for this visit.  Subjective Assessment - 01/01/18 1449    Subjective  I feel 85% better and I am ready for D/C to HEP    Currently in Pain?  Yes    Pain Score  1     Pain Location  Back    Pain Orientation  Lower    Pain Descriptors / Indicators  Sore    Pain Type  Chronic  pain    Pain Onset  More than a month ago    Pain Frequency  Constant    Aggravating Factors   it is worse on Monday, when I am more still    Pain Relieving Factors  moving around         [facility] PT Assessment - 01/01/18 0001      Assessment   Medical Diagnosis  L4-L5 Grade 1 spondylosisthesis      Cognition   Overall Cognitive Status  Within Functional Limits for tasks assessed      Observation/Other Assessments   Focus on Therapeutic Outcomes (FOTO)   42% limitation      Palpation   Palpation comment  mild palpable tenderness over bil lumbar spine- significant improvement from evaluation                   [facility] Adult PT Treatment/Exercise - 01/01/18 0001      Exercises   Exercises  Knee/Hip;Lumbar;Shoulder      Lumbar Exercises: Stretches   Active Hamstring Stretch  Right;Left;3 reps;20 seconds    Lower Trunk Rotation  --      Lumbar Exercises: Aerobic   Nustep  L2-3 x 10 min   PT present     Lumbar Exercises: Standing   Row  Strengthening;Both;Theraband    Theraband Level (Row)  Level 3 (Green)    Row Limitations  --    Shoulder Extension  Strengthening;Both;20 reps;Theraband    Theraband Level (Shoulder Extension)  Level 3 (Green)    Other Standing Lumbar Exercises  --      Lumbar Exercises: Seated   Other Seated Lumbar Exercises  --      Shoulder Exercises: Seated   Horizontal ABduction  Strengthening;Both;20 reps    Theraband Level (Shoulder Horizontal ABduction)  Level 3 (Green)    External Rotation  Strengthening;Both;20 reps;Theraband    Theraband Level (Shoulder External Rotation)  Level 3 (Green)      Cryotherapy   Number Minutes Cryotherapy  10 Minutes    Cryotherapy Location  Lumbar Spine    Type of Cryotherapy  Ice pack             PT Education - 01/01/18 1518    Education Details   Access Code: [email or web address]     Person(s) Educated  Patient    Methods  Explanation;Demonstration;Handout    Comprehension  Verbalized  understanding;Returned demonstration       PT Short Term Goals - 12/13/17 1539      PT SHORT TERM GOAL #2   Title  report a 25% reduction in LBP with sitting/standing and walking    Baseline  --    Status  Achieved      PT SHORT TERM GOAL #3   Title  sit for 20 minutes at work without limitaiton due to LBP    Baseline  no problem at home.  I generally sit ~10 minutes max    Status  Achieved      PT SHORT TERM GOAL #4   Title  stand and walk for 20-25 minutes without limitation due to LBP    Baseline  standing is limited in the community due to hard floor-20 minutes    Status  Achieved      PT SHORT TERM GOAL #5   Title  improve endurance to stand and walk for > or = to 15-20 minutes to improve community function    Baseline  20 minutes     Status  Achieved        PT Long Term Goals - 01/01/18 1450      PT LONG TERM GOAL #1   Title  be independent in advanced HEP    Status  Achieved      PT LONG TERM GOAL #2   Title  reduce FOTO to < or = 39% limitation    Baseline  42% limitation    Status  Partially Met      PT LONG TERM GOAL #3   Title  tolerate sitting > or = to 25-30 minutes without limitation to improve tolerance for work tasks    Baseline  20 minutes    Status  Partially Met      PT LONG TERM GOAL #4   Title  verbalize and demonstrate body mechanics modifications and use abdominal bracing for lumbar protection     Status  Achieved      PT LONG TERM GOAL #5   Title  stand for > or = to 25 minutes for home tasks without limitation    Baseline  10 minutes and then sits for a few minutes- better     Status  Partially Met      PT LONG TERM GOAL #6   Title  report a 60% reduction in LBP with ADLs and work tasks    Status  Achieved            Plan - 01/01/18 1506    Clinical Impression Statement  Pt reports 85% reduction in LBP since the start of care.  PT focused on advancing HEP for postural strength with band including abdominal bracing. PT provided  tactile and demo cues for scapular depression with theraband exercise. Pt is limited to sitting and standing long periods and modifies activities to accommodate this.  FOTO is improved to 42% limitation (48% at evaluation).  Pt has a comprehensive HEP in place and will continue with this and posture/body mechanics modifications after discharge for further gains.      PT Next Visit Plan  D/C PT to HEP    PT Home Exercise Plan  Access Code: [email or web address]    Consulted and Agree with Plan of Care  Patient       Patient will benefit from skilled therapeutic intervention in order to improve the following deficits and impairments:  Decreased activity tolerance  Visit Diagnosis: Chronic bilateral low back pain with bilateral sciatica  Muscle weakness (generalized)  Cramp and spasm     Problem List Patient Active Problem List   Diagnosis Date Noted  .  Other fatigue 07/18/2017  . Shortness of breath on exertion 07/18/2017  . Type 2 diabetes mellitus with retinopathy, with long-term current use of insulin ([place]) 07/18/2017  . Vitamin D deficiency 07/18/2017  . B12 nutritional deficiency 07/18/2017  . Other specified hypothyroidism 07/18/2017  . Acute pain of right knee 12/22/2016  . Trigger index finger of left hand 09/22/2016  . 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 Diabetes ([place]) 01/08/2014  . Essential hypertension 01/08/2014  . Hyperlipidemia 01/08/2014  . Postoperative anemia due to acute blood loss 11/22/2012  . Hyponatremia 11/06/2012  . OA (osteoarthritis) of knee 11/05/2012  . Villonodular synovitis of knee 04/20/2011    PHYSICAL THERAPY DISCHARGE SUMMARY  Visits from Start of Care: 13  Current functional level related to goals / functional outcomes: See above for current status.    Remaining deficits: Pt with functional  weakness in hips and core and has HEP for continued gains.     Education / Equipment: HEP, posture/body mechanics Plan: Patient agrees to discharge.  Patient goals were partially met. Patient is being discharged due to being pleased with the current functional level.  ?????         Sigurd Sos, PT 01/01/18 3:32 PM  [facility] [address] [place], [place], [address] Phone: [phone]   Fax:  [phone]  Name: [name] MRN: [id number] Date of Birth: Dec 03, 1950

## 2018-01-02 ENCOUNTER — Ambulatory Visit (INDEPENDENT_AMBULATORY_CARE_PROVIDER_SITE_OTHER): Payer: 59 | Admitting: Family Medicine

## 2018-01-02 VITALS — BP 134/70 | HR 60 | Ht 61.0 in | Wt 251.0 lb

## 2018-01-02 DIAGNOSIS — Z9189 Other specified personal risk factors, not elsewhere classified: Secondary | ICD-10-CM | POA: Diagnosis not present

## 2018-01-02 DIAGNOSIS — Z6841 Body Mass Index (BMI) 40.0 and over, adult: Secondary | ICD-10-CM

## 2018-01-02 DIAGNOSIS — F3289 Other specified depressive episodes: Secondary | ICD-10-CM

## 2018-01-02 DIAGNOSIS — E559 Vitamin D deficiency, unspecified: Secondary | ICD-10-CM | POA: Diagnosis not present

## 2018-01-02 MED ORDER — BUPROPION HCL ER (SR) 150 MG PO TB12: 150.0000 mg | ORAL_TABLET | Freq: Every day | ORAL | 0 refills | Status: DC

## 2018-01-02 MED FILL — DULoxetine HCL 60 MG CPEP: 60 | 30 days supply | Qty: 30 | Fill #5

## 2018-01-03 ENCOUNTER — Other Ambulatory Visit (INDEPENDENT_AMBULATORY_CARE_PROVIDER_SITE_OTHER): Payer: Self-pay | Admitting: Family Medicine

## 2018-01-03 DIAGNOSIS — E559 Vitamin D deficiency, unspecified: Secondary | ICD-10-CM

## 2018-01-03 DIAGNOSIS — M545 Low back pain: Secondary | ICD-10-CM | POA: Diagnosis not present

## 2018-01-04 MED ORDER — VITAMIN D (ERGOCALCIFEROL) 1.25 MG (50000 UNIT) PO CAPS: 50000.0000 [IU] | ORAL_CAPSULE | ORAL | 0 refills | Status: DC

## 2018-01-04 MED FILL — VIT D2 1.25 MG (50,000 UNIT: 1.25 MG | 28 days supply | Qty: 4 | Fill #0

## 2018-01-04 NOTE — Progress Notes (Signed)
Office: [phone]  /  Fax: [phone]   HPI:   Chief Complaint: OBESITY [name] is here to discuss her progress with her obesity treatment plan. She is following the Category 2 plan and is following her eating plan approximately 50 % of the time. She states she is swimming and doing water exercises 45 minutes 2 times per week. [name] has been off track and notes increased emotional eating and stress eating. She is frustrated that she is back sliding.  Her weight is 251 lb (113.9 kg) today and has not lost weight since her last visit. She has lost 15 lbs since starting treatment with [name].  Vitamin D deficiency [name] has a diagnosis of vitamin D deficiency. She is currently stable on prescription Vit D but not yet at goal. [name] denies nausea, vomiting or muscle weakness.  At risk for osteopenia and osteoporosis [name] is at higher risk of osteopenia and osteoporosis due to vitamin D deficiency.   Depression with emotional eating behaviors [name] is struggling with emotional eating and using food for comfort to the extent that it is negatively impacting her health. She often snacks when she is not hungry. [name] sometimes feels she is out of control and then feels guilty that she made poor food choices. [name]'s mood is stable on Cymbalta but she notes increase in fatigue. She has decreased motivation to meal prep. She also notes increased emotional eating.  She has been working on behavior modification techniques to help reduce her emotional eating and has been somewhat successful. She shows no sign of suicidal or homicidal ideations.  Depression screen [name]  Decreased Interest 1 1 0 0 1  Down, Depressed, Hopeless 2 1 0 1 1  PHQ - 2 Score 3 2 0 1 2  Altered sleeping 2 1 - - 1  Tired, decreased energy 3 3 - - 1  Change in appetite 2 0 - - 1  Feeling bad or failure about yourself  2 - - - 1  Trouble concentrating 1 0 - - 0  Moving  slowly or fidgety/restless 1 0 - - 0  Suicidal thoughts 0 0 - - 0  PHQ-9 Score 14 6 - - 6  Difficult doing work/chores Somewhat difficult Somewhat difficult - - Somewhat difficult      ALLERGIES: Allergies  Allergen Reactions  . Actos [Pioglitazone Hydrochloride] Swelling and Other (See Comments)    Numbness in lips, HEADACHES SWELLING REACTION UNSPECIFIED   . Avandia [Rosiglitazone] Swelling    Numbness in lips, headaches  SWELLING REACTION UNSPECIFIED   . Tetanus Toxoids Other (See Comments)    Arm swelling, fainted, ?Respiratory distress  (horse serum)  . Food Nausea Only    Eggplant    MEDICATIONS: Current Outpatient Medications on File Prior to Visit  Medication Sig Dispense Refill  . amLODipine (NORVASC) 5 MG tablet Take 5 mg by mouth every evening.     [name] amoxicillin (AMOXIL) 500 MG capsule as directed. PRIOR TO DENTAL PROCEDURES  0  . atorvastatin (LIPITOR) 40 MG tablet Take 40 mg by mouth every evening.     . cholecalciferol (VITAMIN D) 1000 units tablet Take 1,000 Units by mouth every evening.     . DULoxetine (CYMBALTA) 60 MG capsule Take 60 mg by mouth daily.    . flecainide (TAMBOCOR) 50 MG tablet TAKE 1 TABLET BY MOUTH TWICE A DAY 180 tablet 2  . hydrochlorothiazide (MICROZIDE) 12.5 MG capsule Take 1 capsule by mouth daily.  3  . HYDROcodone-acetaminophen (NORCO/VICODIN) 5-325 MG tablet TAKE 1 TABLET BY MOUTH EVERY 6 HOURS AS NEEDED FOR MODERATE PAIN. 30 tablet 0  . indomethacin (INDOCIN) 50 MG capsule Take 50 mg by mouth 3 (three) times daily as needed (gout).    . insulin glargine (LANTUS) 100 UNIT/ML injection Inject 40 Units into the skin 2 (two) times daily.     . insulin lispro (HUMALOG) 100 UNIT/ML cartridge Inject 24 Units into the skin 3 (three) times daily with meals. UNLESS BLOOD SUGAR IS 100 OR LESS     . ketorolac (TORADOL) 10 MG tablet Take 10 mg by mouth 3 (three) times daily as needed (CHEST WALL PAIN/PLURACY).    [name] levothyroxine (SYNTHROID,  LEVOTHROID) 125 MCG tablet Take 125 mcg by mouth daily before breakfast.     . Melatonin 5 MG CAPS Take 5 mg by mouth at bedtime as needed (sleep).     . metFORMIN (GLUCOPHAGE-XR) 500 MG 24 hr tablet Take 1,000 mg by mouth daily with supper.    . methocarbamol (ROBAXIN) 500 MG tablet TAKE 1 TABLET BY MOUTH EVERY 6 HOURS AS NEEDED FOR MUSCLE SPASMS 60 tablet 0  . metoprolol tartrate (LOPRESSOR) 50 MG tablet Take 1 tablet (50 mg total) by mouth 2 (two) times daily. 180 tablet 2  . omeprazole (PRILOSEC) 20 MG capsule Take 20 mg by mouth every evening.     . TRULICITY 1.5 MG/0.5ML SOPN as directed.  11  . valsartan (DIOVAN) 320 MG tablet Take 1 tablet by mouth daily.  3  . XARELTO 20 MG TABS tablet TAKE 1 TABLET BY MOUTH ONCE DAILY WITH SUPPER 30 tablet 9   No current facility-administered medications on file prior to visit.     PAST MEDICAL HISTORY: Past Medical History:  Diagnosis Date  . A-fib (HCC)   . Anemia    hx of  . Arthritis   . Chest pain   . Constipation   . Depression   . Diabetes mellitus ORAL MEDS  . Diabetic retinopathy (HCC)   . Dyspnea    with minimal exertion-deconditioned  . Dyspnea   . Dysrhythmia    a-fib  . Food allergy   . GERD (gastroesophageal reflux disease)    occasional  . Gout   . History of kidney stones   . Hypercholesteremia   . Hyperlipidemia   . Hypertension   . Hypertensive kidney disease   . Hypothyroidism   . Knee pain, right   . 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 Obesity   . OSA on CPAP    uses CPAP nightly  . Osteoarthritis   . Palpitations   . Pinched nerve in neck   . Pleurisy   . Pneumonia    hx of  . PONV (postoperative nausea and vomiting)    for 3-4 days after general anesthesia  . Sciatica   . Swelling of knee joint, right   . Synovitis of knee RIGHT  . Trigger finger, left    left index  . Vitamin D deficiency     PAST SURGICAL HISTORY: Past Surgical History:  Procedure Laterality Date  .  CESAREAN SECTION  X3  . KNEE ARTHROSCOPY  04/20/2011   Procedure: ARTHROSCOPY KNEE;  Surgeon: Loanne Drilling, MD;  Location: [facility];  Service: Orthopedics;  Laterality: Right;  WITH SYNOVECTOMY  . LEFT CARPAL TUNNEL / LEFT MIDDLE & RING FINGER TRIGGER RELEASE  08-26-2008  . LEFT SHOULDER ARTHROSCOPY W/ DEBRIDEMENT  09-09-2003  . LEFT SHOULDER ARTHROSCOPY/ LEFT THUMB TRIGGER RELEASE  02-22-2005  . PULLEY RELEASE LEFT LONG FINGER  07-14-2009  . RIGHT CARPAL TUNNEL/ RIGHT THUMB TRIGGER RELEASE'S  11-28-2006  . RIGHT SHOULDER ARTHROSCOPY W/ ROTATOR CUFF REPAIR  01-13-2004  . SHOULDER ARTHROSCOPY DISTAL CLAVICLE EXCISION AND OPEN ROTATOR CUFF REPAIR  09-07-2004   LEFT  . SHOULDER ARTHROSCOPY W/ ACROMIAL REPAIR  11-29-2005   LEFT  . TONSILLECTOMY AND ADENOIDECTOMY  child  . TOTAL KNEE ARTHROPLASTY  12-14-2009   RIGHT  . TOTAL KNEE ARTHROPLASTY Left 11/05/2012   Procedure: LEFT TOTAL KNEE ARTHROPLASTY;  Surgeon: Loanne Drilling, MD;  Location: [facility];  Service: Orthopedics;  Laterality: Left;  . TOTAL KNEE REVISION Right 07/29/2016   Procedure: RIGHT KNEE POLY-LINER EXCHANGE;  Surgeon: Kathryne Hitch, MD;  Location: [facility];  Service: Orthopedics;  Laterality: Right;  . TRIGGER FINGER RELEASE Right 02/21/2013   Procedure: RIGHT RING A-1 PULLEY RELEASE    (MINOR PROCEDURE) ;  Surgeon: Wyn Forster., MD;  Location: [facility];  Service: Orthopedics;  Laterality: Right;  . TRIGGER FINGER RELEASE Left 09/22/2016   Procedure: RELEASE TRIGGER FINGER LEFT INDEX FINGER;  Surgeon: Kathryne Hitch, MD;  Location: [facility];  Service: Orthopedics;  Laterality: Left;  . TRIGGER FINGER RELEASE Right 01/20/2017   Procedure: RELEASE TRIGGER FINGER/A-1 PULLEY RIGHT INDEX FINGER;  Surgeon: Marcene Corning, MD;  Location: [facility];  Service: Orthopedics;  Laterality: Right;    SOCIAL HISTORY: Social History   Tobacco Use  . Smoking status:  Former Smoker    Years: 5.00    Types: Cigarettes    Last attempt to quit: 04/12/1977    Years since quitting: 40.7  . Smokeless tobacco: Never Used  Substance Use Topics  . Alcohol use: No  . Drug use: No    FAMILY HISTORY: Family History  Problem Relation Age of Onset  . Diabetes Mother   . Hypertension Mother   . Heart attack Mother   . Thyroid disease Mother   . Obesity Mother   . Cancer Father   . Liver disease Father   . Sleep apnea Father   . Alcoholism Father     ROS: Review of Systems  Constitutional: Negative for weight loss.  Gastrointestinal: Negative for nausea and vomiting.  Musculoskeletal:       Negative for muscle weakness  Psychiatric/Behavioral: Positive for depression. Negative for suicidal ideas.    PHYSICAL EXAM: Blood pressure 134/70, pulse 60, height 5\' 1"  (1.549 m), weight 251 lb (113.9 kg), SpO2 96 %. Body mass index is 47.43 kg/m. Physical Exam  Constitutional: She is oriented to person, place, and time. She appears well-developed and well-nourished.  Cardiovascular: Normal rate.  Pulmonary/Chest: Effort normal.  Musculoskeletal: Normal range of motion.  Neurological: She is alert and oriented to person, place, and time.  Skin: Skin is warm and dry.  Psychiatric: She has a normal mood and affect. Her behavior is normal.  Nursing note and vitals reviewed.   RECENT LABS AND TESTS: BMET    Component Value Date/Time   NA 142 11/28/2017 1121   K 4.3 11/28/2017 1121   CL 106 11/28/2017 1121   CO2 19 (L) 11/28/2017 1121   GLUCOSE 91 11/28/2017 1121   GLUCOSE 214 (H) 01/17/2017 1459   BUN 25 11/28/2017 1121   CREATININE 1.12 (H) 11/28/2017 1121   CALCIUM 8.6 (L) 11/28/2017 1121   GFRNONAA 51 (L) 11/28/2017 1121   GFRAA  58 (L) 11/28/2017 1121   Lab Results  Component Value Date   HGBA1C 7.4 (H) 11/28/2017   HGBA1C 6.8 (H) 07/18/2017   HGBA1C 7.7 (H) 07/20/2016   HGBA1C 7.9 09/18/2014   No results found for: INSULIN CBC      Component Value Date/Time   WBC 10.0 07/18/2017 1112   WBC 8.6 09/22/2016 0733   RBC 4.57 07/18/2017 1112   RBC 4.31 09/22/2016 0733   HGB 13.0 07/18/2017 1112   HCT 39.6 07/18/2017 1112   PLT 284 09/22/2016 0733   MCV 87 07/18/2017 1112   MCH 28.4 07/18/2017 1112   MCH 28.5 09/22/2016 0733   MCHC 32.8 07/18/2017 1112   MCHC 31.4 09/22/2016 0733   RDW 15.3 07/18/2017 1112   LYMPHSABS 1.8 07/18/2017 1112   EOSABS 0.1 07/18/2017 1112   BASOSABS 0.0 07/18/2017 1112   Iron/TIBC/Ferritin/ %Sat No results found for: IRON, TIBC, FERRITIN, IRONPCTSAT Lipid Panel     Component Value Date/Time   CHOL 126 07/18/2017 1112   TRIG 98 07/18/2017 1112   HDL 43 07/18/2017 1112   LDLCALC 63 07/18/2017 1112   Hepatic Function Panel     Component Value Date/Time   PROT 6.5 11/28/2017 1121   ALBUMIN 3.7 11/28/2017 1121   AST 25 11/28/2017 1121   ALT 24 11/28/2017 1121   ALKPHOS 74 11/28/2017 1121   BILITOT 0.3 11/28/2017 1121      Component Value Date/Time   TSH 3.740 07/18/2017 1112  Results for SHANNYN, JANKOWIAK (MRN [id number]) as of 01/04/2018 14:18  Ref. Range 11/28/2017 11:21  Vitamin D, 25-Hydroxy Latest Ref Range: 30.0 - 100.0 ng/mL 31.4    ASSESSMENT AND PLAN: Vitamin D deficiency - Plan: Vitamin D, Ergocalciferol, (DRISDOL) 50000 units CAPS capsule  Other depression - With emotional eating  - Plan: buPROPion (WELLBUTRIN SR) 150 MG 12 hr tablet  At risk for osteoporosis  Class 3 severe obesity with serious comorbidity and body mass index (BMI) of 45.0 to 49.9 in adult, unspecified obesity type (HCC)  PLAN: Vitamin D Deficiency [name] was informed that low vitamin D levels contributes to fatigue and are associated with obesity, breast, and colon cancer. She agrees to continue taking prescription Vit D @50 ,000 IU every week #4 with no refills. [name] will follow up for routine testing of vitamin D, at least 2-3 times per year. She was informed of the risk of over-replacement  of vitamin D and agrees to not increase her dose unless she discusses this with [name] first. [name] agrees to follow up with our office in 3 weeks.   At risk for osteopenia and osteoporosis [name] was given extended  (15 minutes) osteoporosis prevention counseling today. Marykathryn is at risk for osteopenia and osteoporsis due to her vitamin D deficiency. She was encouraged to take her vitamin D and follow her higher calcium diet and increase strengthening exercise to help strengthen her bones and decrease her risk of osteopenia and osteoporosis.  Depression with Emotional Eating Behaviors We discussed behavior modification techniques today to help Sofya deal with her emotional eating and depression. [name] She will continue her cymbalta. We discussed starting wellbutrin but there is an interaction with her flecainide which can increase her risk of QT prolongation. We discussed CBT to help reduce emotional eating.  She has agreed to follow up with our office in 3 weeks.   Obesity Maddyn is not currently in the action stage of change. As such, her goal is to continue with weight loss efforts She  has agreed to follow the Category 2 plan Poc[place]as has been instructed to work up to a goal of 150 minutes of combined cardio and strengthening exercise per week for weight loss and overall health benefits. We discussed the following Behavioral Modification Stratagies today: increasing lean protein intake, decreasing simple carbohydrates , work on meal planning and easy cooking plans and emotional eating strategies We discussed ways to make her food options more interesting.   Javonna has agreed to follow up with our clinic in 3 weeks. She was informed of the importance of frequent follow up visits to maximize her success with intensive lifestyle modifications for her multiple health conditions.   OBESITY BEHAVIORAL INTERVENTION VISIT  Today's visit was # 9   Starting weight: 265 lbs Starting date:  07/18/2017 Today's weight : 251 lbs  Today's date: 01/02/2018 Total lbs lost to date: 15 lbs    ASK: We discussed the diagnosis of obesity with [name] today and [name] agreed to give [name] permission to discuss obesity behavioral modification therapy today.  ASSESS: Deletha has the diagnosis of obesity and her BMI today is 47.45 Baylen is not in the action stage of change   ADVISE: Issabelle was educated on the multiple health risks of obesity as well as the benefit of weight loss to improve her health. She was advised of the need for long term treatment and the importance of lifestyle modifications to improve her current health and to decrease her risk of future health problems.  AGREE: Multiple dietary modification options and treatment options were discussed and  Dania agreed to follow the recommendations documented in the above note.  ARRANGE: Elaiza was educated on the importance of frequent visits to treat obesity as outlined per CMS and USPSTF guidelines and agreed to schedule her next follow up appointment today.  I, Ellery Plunk, CMA, am acting as transcriptionist for Quillian Quince, MD  I have reviewed the above documentation for accuracy and completeness, and I agree with the above. -Quillian Quince, MD

## 2018-01-08 ENCOUNTER — Telehealth (INDEPENDENT_AMBULATORY_CARE_PROVIDER_SITE_OTHER): Payer: Self-pay | Admitting: Family Medicine

## 2018-01-08 MED FILL — VALSARTAN 320 MG TAB: 320 | 30 days supply | Qty: 30 | Fill #11

## 2018-01-08 NOTE — Telephone Encounter (Signed)
Patient has been waiting on a prescription to be filled that Dr. Dalbert Garnet prescribed.  [name] told her that the medication ordered conflicts with another she takes.  They have made several attempts to speak to Dr. Renae Gloss and will try again tomorrow.  Please advise. Thank you.

## 2018-01-09 ENCOUNTER — Encounter (INDEPENDENT_AMBULATORY_CARE_PROVIDER_SITE_OTHER): Payer: Self-pay | Admitting: Family Medicine

## 2018-01-09 NOTE — Telephone Encounter (Signed)
April is contact the patient

## 2018-01-09 NOTE — Telephone Encounter (Signed)
Spoke with the patient and informed her not to take the Wellbutrin at this time due to her Flecainide interaction. Dr Dalbert Garnet will revisit other options at her next visit. The patient states she did not pick the medication up from the pharmacy. Patient verbalized understanding.   Nema Oatley, CMA

## 2018-01-17 MED FILL — FLECAINIDE ACETATE 50 MG TA: 50 | 90 days supply | Qty: 180 | Fill #2

## 2018-01-17 MED FILL — TRULICITY 1.5 MG/0.5 ML PEN: 1.5 | 28 days supply | Qty: 2 | Fill #0

## 2018-01-17 MED FILL — ATORVASTATIN 40 MG TABLET: 40 | 90 days supply | Qty: 90 | Fill #2

## 2018-01-17 MED FILL — LEVOTHYROXINE 125 MCG TABLE: 125 | 30 days supply | Qty: 30 | Fill #3

## 2018-01-17 MED FILL — XARELTO 20 MG TABLET: 20 | 30 days supply | Qty: 30 | Fill #3

## 2018-01-23 ENCOUNTER — Ambulatory Visit (INDEPENDENT_AMBULATORY_CARE_PROVIDER_SITE_OTHER): Payer: 59 | Admitting: Family Medicine

## 2018-01-23 VITALS — BP 116/63 | HR 65 | Ht 61.0 in | Wt 251.0 lb

## 2018-01-23 DIAGNOSIS — Z794 Long term (current) use of insulin: Secondary | ICD-10-CM | POA: Diagnosis not present

## 2018-01-23 DIAGNOSIS — E119 Type 2 diabetes mellitus without complications: Secondary | ICD-10-CM | POA: Diagnosis not present

## 2018-01-23 DIAGNOSIS — Z6841 Body Mass Index (BMI) 40.0 and over, adult: Secondary | ICD-10-CM

## 2018-01-24 NOTE — Progress Notes (Signed)
Office: [phone]  /  Fax: [phone]   HPI:   Chief Complaint: OBESITY [name] is here to discuss her progress with her obesity treatment plan. She is on the Category 2 plan and is following her eating plan approximately 75 % of the time. [name] states she is walking more at the office and she is swimming 60 minutes 1 time per week. [name] has done well maintaining her weight over Halloween. She notes significant sabotage at work. Her weight is 251 lb (113.9 kg) today and she has maintained weight over a period of 3 weeks since her last visit. She has lost 14 lbs since starting treatment with [name].  Diabetes II on insulin [name] has a diagnosis of diabetes type II. [name] states blood sugars range between 88 and 162 and she denies any hypoglycemic episodes. Last A1c was at 7.4 She has been working on intensive lifestyle modifications including diet, exercise, and weight loss to help control her blood glucose levels.  ALLERGIES: Allergies  Allergen Reactions  . Actos [Pioglitazone Hydrochloride] Swelling and Other (See Comments)    Numbness in lips, HEADACHES SWELLING REACTION UNSPECIFIED   . Avandia [Rosiglitazone] Swelling    Numbness in lips, headaches  SWELLING REACTION UNSPECIFIED   . Tetanus Toxoids Other (See Comments)    Arm swelling, fainted, ?Respiratory distress  (horse serum)  . Food Nausea Only    Eggplant    MEDICATIONS: Current Outpatient Medications on File Prior to Visit  Medication Sig Dispense Refill  . amLODipine (NORVASC) 5 MG tablet Take 5 mg by mouth every evening.     [name] amoxicillin (AMOXIL) 500 MG capsule as directed. PRIOR TO DENTAL PROCEDURES  0  . atorvastatin (LIPITOR) 40 MG tablet Take 40 mg by mouth every evening.     [name] buPROPion (WELLBUTRIN SR) 150 MG 12 hr tablet Take 1 tablet (150 mg total) by mouth daily. 30 tablet 0  . cholecalciferol (VITAMIN D) 1000 units tablet Take 1,000 Units by mouth every evening.     . DULoxetine (CYMBALTA) 60 MG  capsule Take 60 mg by mouth daily.    . flecainide (TAMBOCOR) 50 MG tablet TAKE 1 TABLET BY MOUTH TWICE A DAY 180 tablet 2  . hydrochlorothiazide (MICROZIDE) 12.5 MG capsule Take 1 capsule by mouth daily.  3  . HYDROcodone-acetaminophen (NORCO/VICODIN) 5-325 MG tablet TAKE 1 TABLET BY MOUTH EVERY 6 HOURS AS NEEDED FOR MODERATE PAIN. 30 tablet 0  . indomethacin (INDOCIN) 50 MG capsule Take 50 mg by mouth 3 (three) times daily as needed (gout).    . insulin glargine (LANTUS) 100 UNIT/ML injection Inject 40 Units into the skin 2 (two) times daily.     . insulin lispro (HUMALOG) 100 UNIT/ML cartridge Inject 24 Units into the skin 3 (three) times daily with meals. UNLESS BLOOD SUGAR IS 100 OR LESS     . ketorolac (TORADOL) 10 MG tablet Take 10 mg by mouth 3 (three) times daily as needed (CHEST WALL PAIN/PLURACY).    [name] levothyroxine (SYNTHROID, LEVOTHROID) 125 MCG tablet Take 125 mcg by mouth daily before breakfast.     . Melatonin 5 MG CAPS Take 5 mg by mouth at bedtime as needed (sleep).     . metFORMIN (GLUCOPHAGE-XR) 500 MG 24 hr tablet Take 1,000 mg by mouth daily with supper.    . methocarbamol (ROBAXIN) 500 MG tablet TAKE 1 TABLET BY MOUTH EVERY 6 HOURS AS NEEDED FOR MUSCLE SPASMS 60 tablet 0  . metoprolol tartrate (LOPRESSOR) 50 MG tablet  Take 1 tablet (50 mg total) by mouth 2 (two) times daily. 180 tablet 2  . omeprazole (PRILOSEC) 20 MG capsule Take 20 mg by mouth every evening.     . TRULICITY 1.5 MG/0.5ML SOPN as directed.  11  . valsartan (DIOVAN) 320 MG tablet Take 1 tablet by mouth daily.  3  . Vitamin D, Ergocalciferol, (DRISDOL) 50000 units CAPS capsule Take 1 capsule (50,000 Units total) by mouth every 7 (seven) days. 4 capsule 0  . XARELTO 20 MG TABS tablet TAKE 1 TABLET BY MOUTH ONCE DAILY WITH SUPPER 30 tablet 9   No current facility-administered medications on file prior to visit.     PAST MEDICAL HISTORY: Past Medical History:  Diagnosis Date  . A-fib (HCC)   . Anemia     hx of  . Arthritis   . Chest pain   . Constipation   . Depression   . Diabetes mellitus ORAL MEDS  . Diabetic retinopathy (HCC)   . Dyspnea    with minimal exertion-deconditioned  . Dyspnea   . Dysrhythmia    a-fib  . Food allergy   . GERD (gastroesophageal reflux disease)    occasional  . Gout   . History of kidney stones   . Hypercholesteremia   . Hyperlipidemia   . Hypertension   . Hypertensive kidney disease   . Hypothyroidism   . Knee pain, right   . 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 Obesity   . OSA on CPAP    uses CPAP nightly  . Osteoarthritis   . Palpitations   . Pinched nerve in neck   . Pleurisy   . Pneumonia    hx of  . PONV (postoperative nausea and vomiting)    for 3-4 days after general anesthesia  . Sciatica   . Swelling of knee joint, right   . Synovitis of knee RIGHT  . Trigger finger, left    left index  . Vitamin D deficiency     PAST SURGICAL HISTORY: Past Surgical History:  Procedure Laterality Date  . CESAREAN SECTION  X3  . KNEE ARTHROSCOPY  04/20/2011   Procedure: ARTHROSCOPY KNEE;  Surgeon: Loanne DrillingFrank V Aluisio, MD;  [facility];  Service: Orthopedics;  Laterality: Right;  WITH SYNOVECTOMY  . LEFT CARPAL TUNNEL / LEFT MIDDLE & RING FINGER TRIGGER RELEASE  08-26-2008  . LEFT SHOULDER ARTHROSCOPY W/ DEBRIDEMENT  09-09-2003  . LEFT SHOULDER ARTHROSCOPY/ LEFT THUMB TRIGGER RELEASE  02-22-2005  . PULLEY RELEASE LEFT LONG FINGER  07-14-2009  . RIGHT CARPAL TUNNEL/ RIGHT THUMB TRIGGER RELEASE'S  11-28-2006  . RIGHT SHOULDER ARTHROSCOPY W/ ROTATOR CUFF REPAIR  01-13-2004  . SHOULDER ARTHROSCOPY DISTAL CLAVICLE EXCISION AND OPEN ROTATOR CUFF REPAIR  09-07-2004   LEFT  . SHOULDER ARTHROSCOPY W/ ACROMIAL REPAIR  11-29-2005   LEFT  . TONSILLECTOMY AND ADENOIDECTOMY  child  . TOTAL KNEE ARTHROPLASTY  12-14-2009   RIGHT  . TOTAL KNEE ARTHROPLASTY Left 11/05/2012   Procedure: LEFT TOTAL KNEE ARTHROPLASTY;   Surgeon: Loanne DrillingFrank V Aluisio, MD;  Location: [facility];  Service: Orthopedics;  Laterality: Left;  . TOTAL KNEE REVISION Right 07/29/2016   Procedure: RIGHT KNEE POLY-LINER EXCHANGE;  Surgeon: Kathryne HitchBlackman, Christopher Y, MD;  Location: [facility];  Service: Orthopedics;  Laterality: Right;  . TRIGGER FINGER RELEASE Right 02/21/2013   Procedure: RIGHT RING A-1 PULLEY RELEASE    (MINOR PROCEDURE) ;  Surgeon: Wyn Forsterobert V Sypher Jr., MD;  Location: [facility];  Service: Orthopedics;  Laterality: Right;  . TRIGGER FINGER RELEASE Left 09/22/2016   Procedure: RELEASE TRIGGER FINGER LEFT INDEX FINGER;  Surgeon: Kathryne Hitch, MD;  Location: [facility];  Service: Orthopedics;  Laterality: Left;  . TRIGGER FINGER RELEASE Right 01/20/2017   Procedure: RELEASE TRIGGER FINGER/A-1 PULLEY RIGHT INDEX FINGER;  Surgeon: Marcene Corning, MD;  Location: [facility];  Service: Orthopedics;  Laterality: Right;    SOCIAL HISTORY: Social History   Tobacco Use  . Smoking status: Former Smoker    Years: 5.00    Types: Cigarettes    Last attempt to quit: 04/12/1977    Years since quitting: 40.8  . Smokeless tobacco: Never Used  Substance Use Topics  . Alcohol use: No  . Drug use: No    FAMILY HISTORY: Family History  Problem Relation Age of Onset  . Diabetes Mother   . Hypertension Mother   . Heart attack Mother   . Thyroid disease Mother   . Obesity Mother   . Cancer Father   . Liver disease Father   . Sleep apnea Father   . Alcoholism Father     ROS: Review of Systems  Constitutional: Negative for weight loss.  Endo/Heme/Allergies:       Negative for hypoglycemia    PHYSICAL EXAM: Blood pressure 116/63, pulse 65, height 5\' 1"  (1.549 m), weight 251 lb (113.9 kg), SpO2 96 %. Body mass index is 47.43 kg/m. Physical Exam  Constitutional: She is oriented to person, place, and time. She appears well-developed and well-nourished.  Cardiovascular: Normal rate.  Pulmonary/Chest:  Effort normal.  Musculoskeletal: Normal range of motion.  Neurological: She is oriented to person, place, and time.  Skin: Skin is warm and dry.  Psychiatric: She has a normal mood and affect. Her behavior is normal.  Vitals reviewed.   RECENT LABS AND TESTS: BMET    Component Value Date/Time   NA 142 11/28/2017 1121   K 4.3 11/28/2017 1121   CL 106 11/28/2017 1121   CO2 19 (L) 11/28/2017 1121   GLUCOSE 91 11/28/2017 1121   GLUCOSE 214 (H) 01/17/2017 1459   BUN 25 11/28/2017 1121   CREATININE 1.12 (H) 11/28/2017 1121   CALCIUM 8.6 (L) 11/28/2017 1121   GFRNONAA 51 (L) 11/28/2017 1121   GFRAA 58 (L) 11/28/2017 1121   Lab Results  Component Value Date   HGBA1C 7.4 (H) 11/28/2017   HGBA1C 6.8 (H) 07/18/2017   HGBA1C 7.7 (H) 07/20/2016   HGBA1C 7.9 09/18/2014   No results found for: INSULIN CBC    Component Value Date/Time   WBC 10.0 07/18/2017 1112   WBC 8.6 09/22/2016 0733   RBC 4.57 07/18/2017 1112   RBC 4.31 09/22/2016 0733   HGB 13.0 07/18/2017 1112   HCT 39.6 07/18/2017 1112   PLT 284 09/22/2016 0733   MCV 87 07/18/2017 1112   MCH 28.4 07/18/2017 1112   MCH 28.5 09/22/2016 0733   MCHC 32.8 07/18/2017 1112   MCHC 31.4 09/22/2016 0733   RDW 15.3 07/18/2017 1112   LYMPHSABS 1.8 07/18/2017 1112   EOSABS 0.1 07/18/2017 1112   BASOSABS 0.0 07/18/2017 1112   Iron/TIBC/Ferritin/ %Sat No results found for: IRON, TIBC, FERRITIN, IRONPCTSAT Lipid Panel     Component Value Date/Time   CHOL 126 07/18/2017 1112   TRIG 98 07/18/2017 1112   HDL 43 07/18/2017 1112   LDLCALC 63 07/18/2017 1112   Hepatic Function Panel     Component Value Date/Time  PROT 6.5 11/28/2017 1121   ALBUMIN 3.7 11/28/2017 1121   AST 25 11/28/2017 1121   ALT 24 11/28/2017 1121   ALKPHOS 74 11/28/2017 1121   BILITOT 0.3 11/28/2017 1121      Component Value Date/Time   TSH 3.740 07/18/2017 1112   Results for HEBE, MERRIWETHER (MRN [id number]) as of 01/24/2018 08:55  Ref. Range 11/28/2017  11:21  Vitamin D, 25-Hydroxy Latest Ref Range: 30.0 - 100.0 ng/mL 31.4   ASSESSMENT AND PLAN: Type 2 diabetes mellitus without complication, with long-term current use of insulin (HCC)  Class 3 severe obesity with serious comorbidity and body mass index (BMI) of 45.0 to 49.9 in adult, unspecified obesity type (HCC)  PLAN:  Diabetes II on insulin Annabella has been given extensive diabetes education by myself today including ideal fasting and post-prandial blood glucose readings, individual ideal Hgb A1c goals and hypoglycemia prevention. We discussed the importance of good blood sugar control to decrease the likelihood of diabetic complications such as nephropathy, neuropathy, limb loss, blindness, coronary artery disease, and death. We discussed the importance of intensive lifestyle modification including diet, exercise and weight loss as the first line treatment for diabetes. Cordelia agrees to continue her diabetes medications and watch for hypoglycemia. Sheila agrees to continue with diet and w follow up at the agreed upon time.  I spent > than 50% of the 25 minute visit on counseling as documented in the note.  Obesity Charlee is currently in the action stage of change. As such, her goal is to continue with weight loss efforts She has agreed to follow the Category 2 plan Sabra has been instructed to work up to a goal of 150 minutes of combined cardio and strengthening exercise per week for weight loss and overall health benefits. We discussed the following Behavioral Modification Strategies today: work on meal planning and easy cooking plans, dealing with family or coworker sabotage and holiday eating strategies   Tanysha has agreed to follow up with our clinic in 3 weeks. She was informed of the importance of frequent follow up visits to maximize her success with intensive lifestyle modifications for her multiple health conditions.   OBESITY BEHAVIORAL INTERVENTION VISIT  Today's visit was  # 10   Starting weight: 265 lbs Starting date: 07/18/2017 Today's weight : 251 lbs  Today's date: 01/23/2018 Total lbs lost to date: 14   ASK: We discussed the diagnosis of obesity with Alinda Deem today and Jessic agreed to give [name] permission to discuss obesity behavioral modification therapy today.  ASSESS: Raechal has the diagnosis of obesity and her BMI today is 47.45 Roslin is in the action stage of change   ADVISE: Jayme was educated on the multiple health risks of obesity as well as the benefit of weight loss to improve her health. She was advised of the need for long term treatment and the importance of lifestyle modifications to improve her current health and to decrease her risk of future health problems.  AGREE: Multiple dietary modification options and treatment options were discussed and  Tomesha agreed to follow the recommendations documented in the above note.  ARRANGE: Puanani was educated on the importance of frequent visits to treat obesity as outlined per CMS and USPSTF guidelines and agreed to schedule her next follow up appointment today.  I, Nevada Crane, am acting as transcriptionist for Quillian Quince, MD  I have reviewed the above documentation for accuracy and completeness, and I agree with the above. -Quillian Quince, MD

## 2018-02-05 DIAGNOSIS — M545 Low back pain: Secondary | ICD-10-CM | POA: Diagnosis not present

## 2018-02-05 DIAGNOSIS — M25571 Pain in right ankle and joints of right foot: Secondary | ICD-10-CM | POA: Diagnosis not present

## 2018-02-06 ENCOUNTER — Other Ambulatory Visit: Payer: Self-pay | Admitting: Internal Medicine

## 2018-02-06 DIAGNOSIS — I10 Essential (primary) hypertension: Secondary | ICD-10-CM | POA: Diagnosis not present

## 2018-02-06 DIAGNOSIS — E113213 Type 2 diabetes mellitus with mild nonproliferative diabetic retinopathy with macular edema, bilateral: Secondary | ICD-10-CM | POA: Diagnosis not present

## 2018-02-06 DIAGNOSIS — G4701 Insomnia due to medical condition: Secondary | ICD-10-CM | POA: Diagnosis not present

## 2018-02-06 DIAGNOSIS — F322 Major depressive disorder, single episode, severe without psychotic features: Secondary | ICD-10-CM | POA: Diagnosis not present

## 2018-02-06 DIAGNOSIS — E039 Hypothyroidism, unspecified: Secondary | ICD-10-CM | POA: Diagnosis not present

## 2018-02-06 DIAGNOSIS — E1165 Type 2 diabetes mellitus with hyperglycemia: Secondary | ICD-10-CM | POA: Diagnosis not present

## 2018-02-06 DIAGNOSIS — M8580 Other specified disorders of bone density and structure, unspecified site: Secondary | ICD-10-CM

## 2018-02-06 DIAGNOSIS — G4733 Obstructive sleep apnea (adult) (pediatric): Secondary | ICD-10-CM | POA: Diagnosis not present

## 2018-02-06 DIAGNOSIS — Z79899 Other long term (current) drug therapy: Secondary | ICD-10-CM | POA: Diagnosis not present

## 2018-02-06 DIAGNOSIS — I4891 Unspecified atrial fibrillation: Secondary | ICD-10-CM | POA: Diagnosis not present

## 2018-02-06 DIAGNOSIS — E559 Vitamin D deficiency, unspecified: Secondary | ICD-10-CM | POA: Diagnosis not present

## 2018-02-07 ENCOUNTER — Other Ambulatory Visit (HOSPITAL_COMMUNITY): Payer: Self-pay | Admitting: Orthopaedic Surgery

## 2018-02-07 DIAGNOSIS — M545 Low back pain, unspecified: Secondary | ICD-10-CM

## 2018-02-09 ENCOUNTER — Other Ambulatory Visit (INDEPENDENT_AMBULATORY_CARE_PROVIDER_SITE_OTHER): Payer: Self-pay | Admitting: Family Medicine

## 2018-02-09 DIAGNOSIS — E559 Vitamin D deficiency, unspecified: Secondary | ICD-10-CM

## 2018-02-09 MED FILL — METOPROLOL TARTRATE 50 MG T: 50 | 90 days supply | Qty: 180 | Fill #2

## 2018-02-09 MED FILL — metFORMIN HCL ER 500 MG TB2: 500 | 90 days supply | Qty: 180 | Fill #1

## 2018-02-09 MED FILL — DULOXETINE HCL 60 MG CPEP: 60 | 30 days supply | Qty: 30 | Fill #6

## 2018-02-12 MED FILL — TRULICITY 1.5 MG/0.5 ML PEN: 1.5 | 28 days supply | Qty: 2 | Fill #1

## 2018-02-12 MED FILL — XARELTO 20 MG TABLET: 20 | 30 days supply | Qty: 30 | Fill #4

## 2018-02-12 MED FILL — VALSARTAN 320 MG TAB: 320 | 90 days supply | Qty: 90 | Fill #0

## 2018-02-12 MED FILL — LANTUS 100 UNITS/ML VIAL: 100 | 90 days supply | Qty: 110 | Fill #1

## 2018-02-12 MED FILL — LEVOTHYROXINE 125 MCG TABLE: 125 | 30 days supply | Qty: 30 | Fill #4

## 2018-02-13 ENCOUNTER — Other Ambulatory Visit: Payer: Self-pay | Admitting: Internal Medicine

## 2018-02-13 DIAGNOSIS — Z1231 Encounter for screening mammogram for malignant neoplasm of breast: Secondary | ICD-10-CM

## 2018-02-15 ENCOUNTER — Ambulatory Visit (HOSPITAL_COMMUNITY)
Admission: RE | Admit: 2018-02-15 | Discharge: 2018-02-15 | Disposition: A | Discharge status: Home / Self Care | Payer: 59 | Source: Ambulatory Visit | Attending: Orthopaedic Surgery | Admitting: Orthopaedic Surgery

## 2018-02-15 ENCOUNTER — Ambulatory Visit (INDEPENDENT_AMBULATORY_CARE_PROVIDER_SITE_OTHER): Payer: 59 | Admitting: Family Medicine

## 2018-02-15 VITALS — BP 122/68 | HR 59 | Ht 61.0 in | Wt 251.0 lb

## 2018-02-15 DIAGNOSIS — M4316 Spondylolisthesis, lumbar region: Secondary | ICD-10-CM | POA: Insufficient documentation

## 2018-02-15 DIAGNOSIS — M545 Low back pain, unspecified: Secondary | ICD-10-CM

## 2018-02-15 DIAGNOSIS — M48061 Spinal stenosis, lumbar region without neurogenic claudication: Secondary | ICD-10-CM | POA: Insufficient documentation

## 2018-02-15 DIAGNOSIS — Z6841 Body Mass Index (BMI) 40.0 and over, adult: Secondary | ICD-10-CM | POA: Diagnosis not present

## 2018-02-15 DIAGNOSIS — E559 Vitamin D deficiency, unspecified: Secondary | ICD-10-CM

## 2018-02-15 MED ORDER — VITAMIN D (ERGOCALCIFEROL) 1.25 MG (50000 UNIT) PO CAPS: 50000.0000 [IU] | ORAL_CAPSULE | ORAL | 0 refills | Status: DC

## 2018-02-19 ENCOUNTER — Encounter (INDEPENDENT_AMBULATORY_CARE_PROVIDER_SITE_OTHER): Payer: Self-pay | Admitting: Family Medicine

## 2018-02-19 DIAGNOSIS — Z6841 Body Mass Index (BMI) 40.0 and over, adult: Secondary | ICD-10-CM | POA: Diagnosis not present

## 2018-02-19 DIAGNOSIS — M48061 Spinal stenosis, lumbar region without neurogenic claudication: Secondary | ICD-10-CM | POA: Diagnosis not present

## 2018-02-20 NOTE — Progress Notes (Signed)
Office: [phone]  /  Fax: [phone]   HPI:   Chief Complaint: OBESITY [name] is here to discuss her progress with her obesity treatment plan. She is on the Category 2 plan and is following her eating plan approximately 75 % of the time. She states she is walking and doing pool exercises 60 minutes 5 times per week. [name] has done well maintaining weight over Thanksgiving. She did well with portion control and smarter choices and is ready to get back on track before Christmas. She has a lot of questions about holiday eating strategies.  Her weight is 251 lb (113.9 kg) today and has not lost weight since her last visit. She has lost 14 lbs since starting treatment with [name].  Vitamin D deficiency [name] has a diagnosis of vitamin D deficiency. She is currently taking vit D and is stable, but not yet at goal. She denies nausea, vomiting, or muscle weakness.  ALLERGIES: Allergies  Allergen Reactions  . Actos [Pioglitazone Hydrochloride] Swelling and Other (See Comments)    Numbness in lips, HEADACHES SWELLING REACTION UNSPECIFIED   . Avandia [Rosiglitazone] Swelling    Numbness in lips, headaches  SWELLING REACTION UNSPECIFIED   . Tetanus Toxoids Other (See Comments)    Arm swelling, fainted, ?Respiratory distress  (horse serum)  . Food Nausea Only    Eggplant    MEDICATIONS: Current Outpatient Medications on File Prior to Visit  Medication Sig Dispense Refill  . amLODipine (NORVASC) 5 MG tablet Take 5 mg by mouth every evening.     [name] amoxicillin (AMOXIL) 500 MG capsule as directed. PRIOR TO DENTAL PROCEDURES  0  . atorvastatin (LIPITOR) 40 MG tablet Take 40 mg by mouth every evening.     [name] buPROPion (WELLBUTRIN SR) 150 MG 12 hr tablet Take 1 tablet (150 mg total) by mouth daily. 30 tablet 0  . cholecalciferol (VITAMIN D) 1000 units tablet Take 1,000 Units by mouth every evening.     . DULoxetine (CYMBALTA) 60 MG capsule Take 60 mg by mouth daily.    . flecainide (TAMBOCOR)  50 MG tablet TAKE 1 TABLET BY MOUTH TWICE A DAY 180 tablet 2  . hydrochlorothiazide (MICROZIDE) 12.5 MG capsule Take 1 capsule by mouth daily.  3  . HYDROcodone-acetaminophen (NORCO/VICODIN) 5-325 MG tablet TAKE 1 TABLET BY MOUTH EVERY 6 HOURS AS NEEDED FOR MODERATE PAIN. 30 tablet 0  . indomethacin (INDOCIN) 50 MG capsule Take 50 mg by mouth 3 (three) times daily as needed (gout).    . insulin glargine (LANTUS) 100 UNIT/ML injection Inject 40 Units into the skin 2 (two) times daily.     . insulin lispro (HUMALOG) 100 UNIT/ML cartridge Inject 24 Units into the skin 3 (three) times daily with meals. UNLESS BLOOD SUGAR IS 100 OR LESS     . ketorolac (TORADOL) 10 MG tablet Take 10 mg by mouth 3 (three) times daily as needed (CHEST WALL PAIN/PLURACY).    [name] levothyroxine (SYNTHROID, LEVOTHROID) 125 MCG tablet Take 125 mcg by mouth daily before breakfast.     . Melatonin 5 MG CAPS Take 5 mg by mouth at bedtime as needed (sleep).     . metFORMIN (GLUCOPHAGE-XR) 500 MG 24 hr tablet Take 1,000 mg by mouth daily with supper.    . methocarbamol (ROBAXIN) 500 MG tablet TAKE 1 TABLET BY MOUTH EVERY 6 HOURS AS NEEDED FOR MUSCLE SPASMS 60 tablet 0  . metoprolol tartrate (LOPRESSOR) 50 MG tablet Take 1 tablet (50 mg total) by mouth  2 (two) times daily. 180 tablet 2  . omeprazole (PRILOSEC) 20 MG capsule Take 20 mg by mouth every evening.     . TRULICITY 1.5 MG/0.5ML SOPN as directed.  11  . valsartan (DIOVAN) 320 MG tablet Take 1 tablet by mouth daily.  3  . XARELTO 20 MG TABS tablet TAKE 1 TABLET BY MOUTH ONCE DAILY WITH SUPPER 30 tablet 9   No current facility-administered medications on file prior to visit.     PAST MEDICAL HISTORY: Past Medical History:  Diagnosis Date  . A-fib (HCC)   . Anemia    hx of  . Arthritis   . Chest pain   . Constipation   . Depression   . Diabetes mellitus ORAL MEDS  . Diabetic retinopathy (HCC)   . Dyspnea    with minimal exertion-deconditioned  . Dyspnea   .  Dysrhythmia    a-fib  . Food allergy   . GERD (gastroesophageal reflux disease)    occasional  . Gout   . History of kidney stones   . Hypercholesteremia   . Hyperlipidemia   . Hypertension   . Hypertensive kidney disease   . Hypothyroidism   . Knee pain, right   . 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 Obesity   . OSA on CPAP    uses CPAP nightly  . Osteoarthritis   . Palpitations   . Pinched nerve in neck   . Pleurisy   . Pneumonia    hx of  . PONV (postoperative nausea and vomiting)    for 3-4 days after general anesthesia  . Sciatica   . Swelling of knee joint, right   . Synovitis of knee RIGHT  . Trigger finger, left    left index  . Vitamin D deficiency     PAST SURGICAL HISTORY: Past Surgical History:  Procedure Laterality Date  . CESAREAN SECTION  X3  . KNEE ARTHROSCOPY  04/20/2011   Procedure: ARTHROSCOPY KNEE;  Surgeon: Loanne DrillingFrank V Aluisio, MD;  [facility];  Service: Orthopedics;  Laterality: Right;  WITH SYNOVECTOMY  . LEFT CARPAL TUNNEL / LEFT MIDDLE & RING FINGER TRIGGER RELEASE  08-26-2008  . LEFT SHOULDER ARTHROSCOPY W/ DEBRIDEMENT  09-09-2003  . LEFT SHOULDER ARTHROSCOPY/ LEFT THUMB TRIGGER RELEASE  02-22-2005  . PULLEY RELEASE LEFT LONG FINGER  07-14-2009  . RIGHT CARPAL TUNNEL/ RIGHT THUMB TRIGGER RELEASE'S  11-28-2006  . RIGHT SHOULDER ARTHROSCOPY W/ ROTATOR CUFF REPAIR  01-13-2004  . SHOULDER ARTHROSCOPY DISTAL CLAVICLE EXCISION AND OPEN ROTATOR CUFF REPAIR  09-07-2004   LEFT  . SHOULDER ARTHROSCOPY W/ ACROMIAL REPAIR  11-29-2005   LEFT  . TONSILLECTOMY AND ADENOIDECTOMY  child  . TOTAL KNEE ARTHROPLASTY  12-14-2009   RIGHT  . TOTAL KNEE ARTHROPLASTY Left 11/05/2012   Procedure: LEFT TOTAL KNEE ARTHROPLASTY;  Surgeon: Loanne DrillingFrank V Aluisio, MD;  Location: [facility];  Service: Orthopedics;  Laterality: Left;  . TOTAL KNEE REVISION Right 07/29/2016   Procedure: RIGHT KNEE POLY-LINER EXCHANGE;  Surgeon: Kathryne HitchBlackman, Christopher  Y, MD;  Location: [facility];  Service: Orthopedics;  Laterality: Right;  . TRIGGER FINGER RELEASE Right 02/21/2013   Procedure: RIGHT RING A-1 PULLEY RELEASE    (MINOR PROCEDURE) ;  Surgeon: Wyn Forsterobert V Sypher Jr., MD;  [facility] [place];  Service: Orthopedics;  Laterality: Right;  . TRIGGER FINGER RELEASE Left 09/22/2016   Procedure: RELEASE TRIGGER FINGER LEFT INDEX FINGER;  Surgeon: Kathryne HitchBlackman, Christopher Y, MD;  [facility];  Service: Orthopedics;  Laterality: Left;  . TRIGGER FINGER RELEASE Right 01/20/2017   Procedure: RELEASE TRIGGER FINGER/A-1 PULLEY RIGHT INDEX FINGER;  Surgeon: Marcene Corning, MD;  Location: [facility];  Service: Orthopedics;  Laterality: Right;    SOCIAL HISTORY: Social History   Tobacco Use  . Smoking status: Former Smoker    Years: 5.00    Types: Cigarettes    Last attempt to quit: 04/12/1977    Years since quitting: 40.8  . Smokeless tobacco: Never Used  Substance Use Topics  . Alcohol use: No  . Drug use: No    FAMILY HISTORY: Family History  Problem Relation Age of Onset  . Diabetes Mother   . Hypertension Mother   . Heart attack Mother   . Thyroid disease Mother   . Obesity Mother   . Cancer Father   . Liver disease Father   . Sleep apnea Father   . Alcoholism Father     ROS: Review of Systems  Constitutional: Negative for weight loss.  Gastrointestinal: Negative for nausea and vomiting.  Musculoskeletal:       Negative for muscle weakness.    PHYSICAL EXAM: Blood pressure 122/68, pulse (!) 59, height 5\' 1"  (1.549 m), weight 251 lb (113.9 kg), SpO2 94 %. Body mass index is 47.43 kg/m. Physical Exam  Constitutional: She is oriented to person, place, and time. She appears well-developed and well-nourished.  Cardiovascular: Normal rate.  Pulmonary/Chest: Effort normal.  Musculoskeletal: Normal range of motion.  Neurological: She is oriented to person, place, and time.  Skin: Skin is warm and dry.    Psychiatric: She has a normal mood and affect. Her behavior is normal.  Vitals reviewed.   RECENT LABS AND TESTS: BMET    Component Value Date/Time   NA 142 11/28/2017 1121   K 4.3 11/28/2017 1121   CL 106 11/28/2017 1121   CO2 19 (L) 11/28/2017 1121   GLUCOSE 91 11/28/2017 1121   GLUCOSE 214 (H) 01/17/2017 1459   BUN 25 11/28/2017 1121   CREATININE 1.12 (H) 11/28/2017 1121   CALCIUM 8.6 (L) 11/28/2017 1121   GFRNONAA 51 (L) 11/28/2017 1121   GFRAA 58 (L) 11/28/2017 1121   Lab Results  Component Value Date   HGBA1C 7.4 (H) 11/28/2017   HGBA1C 6.8 (H) 07/18/2017   HGBA1C 7.7 (H) 07/20/2016   HGBA1C 7.9 09/18/2014   No results found for: INSULIN CBC    Component Value Date/Time   WBC 10.0 07/18/2017 1112   WBC 8.6 09/22/2016 0733   RBC 4.57 07/18/2017 1112   RBC 4.31 09/22/2016 0733   HGB 13.0 07/18/2017 1112   HCT 39.6 07/18/2017 1112   PLT 284 09/22/2016 0733   MCV 87 07/18/2017 1112   MCH 28.4 07/18/2017 1112   MCH 28.5 09/22/2016 0733   MCHC 32.8 07/18/2017 1112   MCHC 31.4 09/22/2016 0733   RDW 15.3 07/18/2017 1112   LYMPHSABS 1.8 07/18/2017 1112   EOSABS 0.1 07/18/2017 1112   BASOSABS 0.0 07/18/2017 1112   Iron/TIBC/Ferritin/ %Sat No results found for: IRON, TIBC, FERRITIN, IRONPCTSAT Lipid Panel     Component Value Date/Time   CHOL 126 07/18/2017 1112   TRIG 98 07/18/2017 1112   HDL 43 07/18/2017 1112   LDLCALC 63 07/18/2017 1112   Hepatic Function Panel     Component Value Date/Time   PROT 6.5 11/28/2017 1121   ALBUMIN 3.7 11/28/2017 1121   AST 25 11/28/2017 1121   ALT 24 11/28/2017 1121  ALKPHOS 74 11/28/2017 1121   BILITOT 0.3 11/28/2017 1121      Component Value Date/Time   TSH 3.740 07/18/2017 1112   Results for KARNA, ABED (MRN [id number]) as of 02/20/2018 09:21  Ref. Range 11/28/2017 11:21  Vitamin D, 25-Hydroxy Latest Ref Range: 30.0 - 100.0 ng/mL 31.4   ASSESSMENT AND PLAN: Vitamin D deficiency - Plan: Vitamin D,  Ergocalciferol, (DRISDOL) 1.25 MG (50000 UT) CAPS capsule  Class 3 severe obesity with serious comorbidity and body mass index (BMI) of 45.0 to 49.9 in adult, unspecified obesity type (HCC)  PLAN:  Vitamin D Deficiency Shellene was informed that low vitamin D levels contributes to fatigue and are associated with obesity, breast, and colon cancer. She agrees to continue to take prescription Vit D @50 ,000 IU every week #4 with no refills and will follow up for routine testing of vitamin D, at least 2-3 times per year. She was informed of the risk of over-replacement of vitamin D and agrees to not increase her dose unless she discusses this with [name] first. Elienai agrees to follow up in 4 weeks.  I spent > than 50% of the 25 minute visit on counseling as documented in the note.  Obesity Briya is currently in the action stage of change. As such, her goal is to continue with weight loss efforts. She has agreed to change to the lower carbohydrate, vegetable, and lean protein rich diet plan. Marquetta has been instructed to work up to a goal of 150 minutes of combined cardio and strengthening exercise per week for weight loss and overall health benefits. We discussed the following Behavioral Modification Strategies today: increasing lean protein intake, decreasing simple carbohydrates, work on meal planning and easy cooking plans, holiday eating strategies, and celebration eating strategies.  Rosanna has agreed to follow up with our clinic in 4 weeks. She was informed of the importance of frequent follow up visits to maximize her success with intensive lifestyle modifications for her multiple health conditions.   OBESITY BEHAVIORAL INTERVENTION VISIT  Today's visit was # 11  Starting weight: 265 lbs Starting date: 07/18/17 Today's weight : Weight: 251 lb (113.9 kg)  Today's date: 02/15/2018 Total lbs lost to date: 14  ASK: We discussed the diagnosis of obesity with Alinda Deem today and Rushie  agreed to give [name] permission to discuss obesity behavioral modification therapy today.  ASSESS: Amaliya has the diagnosis of obesity and her BMI today is 47.4. Amyrah is in the action stage of change.   ADVISE: Quantasia was educated on the multiple health risks of obesity as well as the benefit of weight loss to improve her health. She was advised of the need for long term treatment and the importance of lifestyle modifications to improve her current health and to decrease her risk of future health problems.  AGREE: Multiple dietary modification options and treatment options were discussed and Danay agreed to follow the recommendations documented in the above note.  ARRANGE: Bryony was educated on the importance of frequent visits to treat obesity as outlined per CMS and USPSTF guidelines and agreed to schedule her next follow up appointment today.  I, Kirke Corin, am acting as transcriptionist for Wilder Glade, MD  I have reviewed the above documentation for accuracy and completeness, and I agree with the above. -Quillian Quince, MD

## 2018-02-21 ENCOUNTER — Telehealth: Payer: Self-pay | Admitting: *Deleted

## 2018-02-21 NOTE — Telephone Encounter (Signed)
   [facility] Pre-operative Risk Assessment    Request for surgical clearance:  1. What type of surgery is being performed? LUMBAR ESI INJECTION   2. When is this surgery scheduled? TBD   3. Are there any medications that need to be held prior to surgery and how long? XARELTO 48 HOURS PRIOR  4. Practice name and name of physician performing surgery? [facility]   5. What is your office phone and fax number? [phone]; FAX# [phone]   6. Anesthesia type (None, local, MAC, general) ? LIDOCAINE? NONE LISTED   [name] 02/21/2018, 3:58 PM  _________________________________________________________________   (provider comments below)

## 2018-02-23 NOTE — Telephone Encounter (Signed)
[name] and verified that they will be using Lidocaine for her Lumbar Injection.

## 2018-02-23 NOTE — Telephone Encounter (Signed)
Kim  CMA with Dr Nash DimmerWong's office returned  your call and asked for a call back please.  #[phone]

## 2018-02-27 NOTE — Telephone Encounter (Signed)
Pt takes Xarelto for afib with CHADS2VASc score of 4 (age, sex, HTN, DM). Renal function is normal. Recommend holding Xarelto for 3 days prior to lumbar injection per protocol.

## 2018-02-27 NOTE — Telephone Encounter (Signed)
Please address xarelto thanks 

## 2018-02-28 ENCOUNTER — Telehealth: Payer: Self-pay | Admitting: Cardiovascular Disease

## 2018-02-28 NOTE — Telephone Encounter (Signed)
See other clearance note. Will close this one

## 2018-02-28 NOTE — Telephone Encounter (Signed)
   Primary Cardiologist: Charlton HawsPeter Nishan, MD  Chart reviewed as part of pre-operative protocol coverage. Patient was contacted 02/28/2018 in reference to pre-operative risk assessment for pending surgery as outlined below.  Stefanie RiceBonnie D Gonzales was last seen on 05/2017 by Dr. Eden EmmsNishan. H/o PAF, obesity, depression, DM, GERD, HTN, HLD, dyspnea. Normal nuc 07/2016. Normal EF and BNP 05/2017.  I called patient to see how she is feeling but got voicemail. LMOM for her to call preop team back. If feeling stable, anticipate OK to proceed.  [name], PA-C 02/28/2018, 2:56 PM

## 2018-02-28 NOTE — Telephone Encounter (Signed)
[facility]

## 2018-02-28 NOTE — Telephone Encounter (Signed)
I saw other phone note that patient was returning my call. I tried to call her again x 2 but got no response. LMOM to call back tomorrow after 1:30-2pm.  Ronie Spiesayna Clay Menser PA-C

## 2018-03-01 DIAGNOSIS — Z5181 Encounter for therapeutic drug level monitoring: Secondary | ICD-10-CM | POA: Diagnosis not present

## 2018-03-01 DIAGNOSIS — E039 Hypothyroidism, unspecified: Secondary | ICD-10-CM | POA: Diagnosis not present

## 2018-03-01 DIAGNOSIS — Z8639 Personal history of other endocrine, nutritional and metabolic disease: Secondary | ICD-10-CM | POA: Diagnosis not present

## 2018-03-01 DIAGNOSIS — E11319 Type 2 diabetes mellitus with unspecified diabetic retinopathy without macular edema: Secondary | ICD-10-CM | POA: Diagnosis not present

## 2018-03-01 DIAGNOSIS — Z794 Long term (current) use of insulin: Secondary | ICD-10-CM | POA: Diagnosis not present

## 2018-03-01 NOTE — Telephone Encounter (Signed)
   Primary Cardiologist: Charlton HawsPeter Nishan, MD  Chart reviewed as part of pre-operative protocol coverage. Given past medical history and time since last visit, based on ACC/AHA guidelines, Stefanie RiceBonnie D Gonzales would be at acceptable risk for the planned procedure without further cardiovascular testing.   Per pharmacy, Pt takes Xarelto for afib with CHADS2VASc score of 4 (age, sex, HTN, DM). Renal function is normal. Recommend holding Xarelto for 3 days prior to lumbar injection per protocol.  I will route this recommendation to the requesting party via Epic fax function and remove from pre-op pool.  Please call with questions.  [name], PA-C 03/01/2018, 4:19 PM

## 2018-03-02 DIAGNOSIS — M25571 Pain in right ankle and joints of right foot: Secondary | ICD-10-CM | POA: Diagnosis not present

## 2018-03-08 MED FILL — HYDROCHLOROTHIAZIDE 12.5 MG: 12.5 | 90 days supply | Qty: 90 | Fill #0

## 2018-03-08 MED FILL — AMLODIPINE BESYLATE 5 MG TA: 5 | 90 days supply | Qty: 90 | Fill #0

## 2018-03-09 DIAGNOSIS — H3581 Retinal edema: Secondary | ICD-10-CM | POA: Diagnosis not present

## 2018-03-09 DIAGNOSIS — H43811 Vitreous degeneration, right eye: Secondary | ICD-10-CM | POA: Diagnosis not present

## 2018-03-09 DIAGNOSIS — E113312 Type 2 diabetes mellitus with moderate nonproliferative diabetic retinopathy with macular edema, left eye: Secondary | ICD-10-CM | POA: Diagnosis not present

## 2018-03-09 DIAGNOSIS — H4311 Vitreous hemorrhage, right eye: Secondary | ICD-10-CM | POA: Diagnosis not present

## 2018-03-09 DIAGNOSIS — H25813 Combined forms of age-related cataract, bilateral: Secondary | ICD-10-CM | POA: Diagnosis not present

## 2018-03-09 DIAGNOSIS — H353121 Nonexudative age-related macular degeneration, left eye, early dry stage: Secondary | ICD-10-CM | POA: Diagnosis not present

## 2018-03-09 DIAGNOSIS — H3582 Retinal ischemia: Secondary | ICD-10-CM | POA: Diagnosis not present

## 2018-03-09 DIAGNOSIS — E103392 Type 1 diabetes mellitus with moderate nonproliferative diabetic retinopathy without macular edema, left eye: Secondary | ICD-10-CM | POA: Diagnosis not present

## 2018-03-09 DIAGNOSIS — H53131 Sudden visual loss, right eye: Secondary | ICD-10-CM | POA: Diagnosis not present

## 2018-03-11 MED FILL — TRULICITY 1.5 MG/0.5 ML PEN: 1.5 | 28 days supply | Qty: 2 | Fill #2

## 2018-03-15 MED FILL — DULoxetine HCL 60 MG CPEP: 60 | 30 days supply | Qty: 30 | Fill #7

## 2018-03-17 ENCOUNTER — Other Ambulatory Visit: Payer: Self-pay

## 2018-03-17 ENCOUNTER — Encounter (HOSPITAL_COMMUNITY): Payer: Self-pay | Admitting: *Deleted

## 2018-03-17 NOTE — Progress Notes (Signed)
Patient denies chest pain or Shob. Reports that cardiologist is Dr. Eden Emms. States she was told by Dr. Allena Katz she did not need to stop Xarelto. Told patient to stop indocin, Toradol, and herbal medications today. Below instructions provided regarding DM.     o Check your blood sugar the morning of your surgery when you wake up and every 2 hours until you get to the Short Stay unit. . If your blood sugar is less than 70 mg/dL, you will need to treat for low blood sugar: o Do not take insulin. o Treat a low blood sugar (less than 70 mg/dL) with  cup of clear juice (cranberry or apple), 4 glucose tablets, OR glucose gel. Recheck blood sugar in 15 minutes after treatment (to make sure it is greater than 70 mg/dL). If your blood sugar is not greater than 70 mg/dL on recheck, call [phone] o  for further instructions. . Report your blood sugar to the short stay nurse when you get to Short Stay.  . If you are admitted to the hospital after surgery: o Your blood sugar will be checked by the staff and you will probably be given insulin after surgery (instead of oral diabetes medicines) to make sure you have good blood sugar levels. o The goal for blood sugar control after surgery is 80-180 mg/dL.  WHAT DO I DO ABOUT MY DIABETES MEDICATION?   [name] Do not take oral diabetes medicines (pills) the morning of surgery.  . THE NIGHT BEFORE SURGERY, take ____20_______ units of _Lantus__________insulin.       . THE MORNING OF SURGERY, take __20___________ units of _Lantus_________insulin.  . The day of surgery, do not take other diabetes injectables, including Byetta (exenatide), Bydureon (exenatide ER), Victoza (liraglutide), or Trulicity (dulaglutide).

## 2018-03-19 ENCOUNTER — Encounter (INDEPENDENT_AMBULATORY_CARE_PROVIDER_SITE_OTHER): Payer: Self-pay | Admitting: Family Medicine

## 2018-03-19 ENCOUNTER — Ambulatory Visit (INDEPENDENT_AMBULATORY_CARE_PROVIDER_SITE_OTHER): Payer: 59 | Admitting: Family Medicine

## 2018-03-19 ENCOUNTER — Ambulatory Visit: Payer: Self-pay | Admitting: Ophthalmology

## 2018-03-19 VITALS — BP 153/64 | HR 61 | Temp 98.0°F | Ht 61.0 in | Wt 254.0 lb

## 2018-03-19 DIAGNOSIS — Z6841 Body Mass Index (BMI) 40.0 and over, adult: Secondary | ICD-10-CM | POA: Diagnosis not present

## 2018-03-19 DIAGNOSIS — E559 Vitamin D deficiency, unspecified: Secondary | ICD-10-CM

## 2018-03-19 NOTE — Anesthesia Preprocedure Evaluation (Addendum)
Anesthesia Evaluation  Patient identified by MRN, date of birth, ID band Patient awake    Reviewed: Allergy & Precautions, NPO status , Patient's Chart, lab work & pertinent test results, reviewed documented beta blocker date and time   History of Anesthesia Complications (+) PONV and history of anesthetic complications  Airway Mallampati: II  TM Distance: >3 FB Neck ROM: Full    Dental no notable dental hx.    Pulmonary sleep apnea and Continuous Positive Airway Pressure Ventilation , former smoker,    Pulmonary exam normal breath sounds clear to auscultation       Cardiovascular hypertension, Pt. on medications and Pt. on home beta blockers Normal cardiovascular exam+ dysrhythmias Atrial Fibrillation  Rhythm:Regular Rate:Normal  ECG: SR, rate 62  ECHO: LV EF: 60% - 65%   Neuro/Psych PSYCHIATRIC DISORDERS Depression Left arm numbness    GI/Hepatic Neg liver ROS, GERD  Medicated and Controlled,  Endo/Other  diabetes, Insulin Dependent, Oral Hypoglycemic AgentsHypothyroidism Morbid obesity  Renal/GU negative Renal ROS     Musculoskeletal negative musculoskeletal ROS (+) Gout   Abdominal (+) + obese,   Peds  Hematology HLD   Anesthesia Other Findings VITREOUS HEMORRHAGE  Reproductive/Obstetrics                           Anesthesia Physical Anesthesia Plan  ASA: III  Anesthesia Plan: MAC   Post-op Pain Management:    Induction: Intravenous  PONV Risk Score and Plan: 3 and Ondansetron, Propofol infusion and Treatment may vary due to age or medical condition  Airway Management Planned: Nasal Cannula  Additional Equipment:   Intra-op Plan:   Post-operative Plan:   Informed Consent: I have reviewed the patients History and Physical, chart, labs and discussed the procedure including the risks, benefits and alternatives for the proposed anesthesia with the patient or authorized  representative who has indicated his/her understanding and acceptance.   Dental advisory given  Plan Discussed with: CRNA  Anesthesia Plan Comments: (Follows with cardiology for PAF. Echo 05/25/17 with EF 60-65% no valve disease, Holter 07/06/16 frequent PAF correlating with palpitations. Myovue 07/21/16 EF 64 % with no ischemia. Per Dr. Posey Pronto she is to continue Xarelto. )      Anesthesia Quick Evaluation

## 2018-03-19 NOTE — Progress Notes (Signed)
Pt does not have pre-op orders in Epic. Called Dr. Charlean Sanfilippo office and spoke with [facility]. She states she will give him the message.

## 2018-03-19 NOTE — H&P (Signed)
Date of examination:  03/20/18  Indication for surgery: Non-clearing vitreous hemorrhage right eye  Pertinent past medical history:  Past Medical History:  Diagnosis Date  . A-fib (HCC)   . Anemia    hx of  . Arthritis   . Chest pain   . Constipation   . Depression   . Diabetes mellitus ORAL MEDS  . Diabetic retinopathy (HCC)   . Dyspnea    with minimal exertion-deconditioned  . Dyspnea   . Dysrhythmia    a-fib  . Food allergy   . GERD (gastroesophageal reflux disease)    occasional  . Gout   . History of kidney stones   . Hypercholesteremia   . Hyperlipidemia   . Hypertension   . Hypertensive kidney disease   . Hypothyroidism   . Knee pain, right   . 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 Obesity   . OSA on CPAP    uses CPAP nightly  . Osteoarthritis   . Palpitations   . Pinched nerve in neck   . Pleurisy   . Pneumonia    hx of  . PONV (postoperative nausea and vomiting)    for 3-4 days after general anesthesia  . Sciatica   . Swelling of knee joint, right   . Synovitis of knee RIGHT  . Trigger finger, left    left index  . Vitamin D deficiency     Pertinent ocular history:  Diabetic retinopathy  Pertinent family history:  Family History  Problem Relation Age of Onset  . Diabetes Mother   . Hypertension Mother   . Heart attack Mother   . Thyroid disease Mother   . Obesity Mother   . Cancer Father   . Liver disease Father   . Sleep apnea Father   . Alcoholism Father     General:  Healthy appearing patient in no distress.    Eyes:    Acuity OD CF    External: Within normal limits     Anterior segment: Within normal limits       Fundus: No view OD - B-scan - retina attached   Impression: Non-clearing vitreous hemorrhage right eye  Plan: Pars plana vitrectomy with endolaser right eye  Harrold Donath

## 2018-03-20 ENCOUNTER — Encounter (HOSPITAL_COMMUNITY): Payer: Self-pay

## 2018-03-20 ENCOUNTER — Other Ambulatory Visit: Payer: Self-pay

## 2018-03-20 ENCOUNTER — Ambulatory Visit (HOSPITAL_COMMUNITY)
Admission: RE | Admit: 2018-03-20 | Discharge: 2018-03-20 | Disposition: A | Discharge status: Home / Self Care | Payer: 59 | Source: Ambulatory Visit | Attending: Ophthalmology | Admitting: Ophthalmology

## 2018-03-20 ENCOUNTER — Ambulatory Visit (HOSPITAL_COMMUNITY): Payer: 59 | Admitting: Physician Assistant

## 2018-03-20 ENCOUNTER — Encounter (HOSPITAL_COMMUNITY)
Admission: RE | Disposition: A | Discharge status: Home / Self Care | Payer: Self-pay | Source: Ambulatory Visit | Attending: Ophthalmology

## 2018-03-20 DIAGNOSIS — E119 Type 2 diabetes mellitus without complications: Secondary | ICD-10-CM | POA: Diagnosis not present

## 2018-03-20 DIAGNOSIS — Z87891 Personal history of nicotine dependence: Secondary | ICD-10-CM | POA: Insufficient documentation

## 2018-03-20 DIAGNOSIS — K219 Gastro-esophageal reflux disease without esophagitis: Secondary | ICD-10-CM | POA: Diagnosis not present

## 2018-03-20 DIAGNOSIS — E11319 Type 2 diabetes mellitus with unspecified diabetic retinopathy without macular edema: Secondary | ICD-10-CM | POA: Insufficient documentation

## 2018-03-20 DIAGNOSIS — H4311 Vitreous hemorrhage, right eye: Secondary | ICD-10-CM | POA: Diagnosis not present

## 2018-03-20 DIAGNOSIS — Z9989 Dependence on other enabling machines and devices: Secondary | ICD-10-CM | POA: Diagnosis not present

## 2018-03-20 DIAGNOSIS — I10 Essential (primary) hypertension: Secondary | ICD-10-CM | POA: Insufficient documentation

## 2018-03-20 DIAGNOSIS — Z794 Long term (current) use of insulin: Secondary | ICD-10-CM | POA: Insufficient documentation

## 2018-03-20 DIAGNOSIS — Z6841 Body Mass Index (BMI) 40.0 and over, adult: Secondary | ICD-10-CM | POA: Diagnosis not present

## 2018-03-20 DIAGNOSIS — G4733 Obstructive sleep apnea (adult) (pediatric): Secondary | ICD-10-CM | POA: Insufficient documentation

## 2018-03-20 HISTORY — PX: PHOTOCOAGULATION WITH LASER: SHX6027

## 2018-03-20 HISTORY — PX: VITRECTOMY 25 GAUGE WITH SCLERAL BUCKLE: SHX6183

## 2018-03-20 LAB — BASIC METABOLIC PANEL
Anion gap: 9 (ref 5–15)
BUN: 22 mg/dL (ref 8–23)
CO2: 25 mmol/L (ref 22–32)
Calcium: 9.9 mg/dL (ref 8.9–10.3)
Chloride: 104 mmol/L (ref 98–111)
Creatinine, Ser: 1.2 mg/dL — ABNORMAL HIGH (ref 0.44–1.00)
GFR calc Af Amer: 53 mL/min — ABNORMAL LOW (ref >60)
GFR calc non Af Amer: 46 mL/min — ABNORMAL LOW (ref >60)
Glucose, Bld: 76 mg/dL (ref 70–99)
Potassium: 4 mmol/L (ref 3.5–5.1)
Sodium: 138 mmol/L (ref 135–145)

## 2018-03-20 LAB — CBC
HCT: 44 % (ref 36.0–46.0)
Hemoglobin: 13.5 g/dL (ref 12.0–15.0)
MCH: 28.4 pg (ref 26.0–34.0)
MCHC: 30.7 g/dL (ref 30.0–36.0)
MCV: 92.6 fL (ref 80.0–100.0)
Platelets: 332 10*3/uL (ref 150–400)
RBC: 4.75 MIL/uL (ref 3.87–5.11)
RDW: 14.6 % (ref 11.5–15.5)
WBC: 11.7 10*3/uL — ABNORMAL HIGH (ref 4.0–10.5)
nRBC: 0 % (ref 0.0–0.2)

## 2018-03-20 LAB — GLUCOSE, CAPILLARY
Glucose-Capillary: 66 mg/dL — ABNORMAL LOW (ref 70–99)
Glucose-Capillary: 133 mg/dL — ABNORMAL HIGH (ref 70–99)
Glucose-Capillary: 84 mg/dL (ref 70–99)
Glucose-Capillary: 75 mg/dL (ref 70–99)
Glucose-Capillary: 84 mg/dL (ref 70–99)

## 2018-03-20 SURGERY — VITRECTOMY, USING 25-GAUGE INSTRUMENTS, WITH SCLERAL BUCKLING
Anesthesia: Monitor Anesthesia Care | Site: Eye | Laterality: Right

## 2018-03-20 MED ORDER — SODIUM HYALURONATE 10 MG/ML IO SOLN
INTRAOCULAR | Status: AC
  Filled 2018-03-20: qty 0.85

## 2018-03-20 MED ORDER — CEFAZOLIN SODIUM-DEXTROSE 2-4 GM/100ML-% IV SOLN
INTRAVENOUS | Status: AC
  Filled 2018-03-20: qty 100

## 2018-03-20 MED ORDER — ATROPINE SULFATE 1 % OP SOLN
OPHTHALMIC | Status: AC
  Filled 2018-03-20: qty 5

## 2018-03-20 MED ORDER — DEXAMETHASONE SODIUM PHOSPHATE 10 MG/ML IJ SOLN
INTRAMUSCULAR | Status: DC | PRN
  Administered 2018-03-20: 10 mg via INTRAMUSCULAR

## 2018-03-20 MED ORDER — OFLOXACIN 0.3 % OP SOLN
OPHTHALMIC | Status: AC
  Administered 2018-03-20: 1 [drp] via OPHTHALMIC
  Filled 2018-03-20: qty 5

## 2018-03-20 MED ORDER — OFLOXACIN 0.3 % OP SOLN
1.0000 [drp] | OPHTHALMIC | Status: AC | PRN
  Administered 2018-03-20 (×3): 1 [drp] via OPHTHALMIC

## 2018-03-20 MED ORDER — TROPICAMIDE 1 % OP SOLN
1.0000 [drp] | OPHTHALMIC | Status: AC | PRN
  Administered 2018-03-20 (×3): 1 [drp] via OPHTHALMIC

## 2018-03-20 MED ORDER — BUPIVACAINE HCL (PF) 0.75 % IJ SOLN
INTRAMUSCULAR | Status: AC
  Filled 2018-03-20: qty 10

## 2018-03-20 MED ORDER — ONDANSETRON HCL 4 MG/2ML IJ SOLN: 4.0000 mg | Freq: Once | INTRAMUSCULAR | Status: DC | PRN

## 2018-03-20 MED ORDER — BSS PLUS IO SOLN
INTRAOCULAR | Status: AC
  Filled 2018-03-20: qty 500

## 2018-03-20 MED ORDER — OFLOXACIN 0.3 % OP SOLN: 1.0000 [drp] | OPHTHALMIC | Status: DC | PRN

## 2018-03-20 MED ORDER — TOBRAMYCIN-DEXAMETHASONE 0.3-0.1 % OP OINT
TOPICAL_OINTMENT | OPHTHALMIC | Status: AC
  Filled 2018-03-20: qty 3.5

## 2018-03-20 MED ORDER — TOBRAMYCIN 0.3 % OP OINT
TOPICAL_OINTMENT | OPHTHALMIC | Status: DC | PRN
  Administered 2018-03-20: 1 via OPHTHALMIC

## 2018-03-20 MED ORDER — PHENYLEPHRINE HCL 2.5 % OP SOLN
OPHTHALMIC | Status: AC
  Administered 2018-03-20: 1 [drp] via OPHTHALMIC
  Filled 2018-03-20: qty 2

## 2018-03-20 MED ORDER — MIDAZOLAM HCL 2 MG/2ML IJ SOLN
INTRAMUSCULAR | Status: AC
  Filled 2018-03-20: qty 2

## 2018-03-20 MED ORDER — MIDAZOLAM HCL 5 MG/5ML IJ SOLN
INTRAMUSCULAR | Status: DC | PRN
  Administered 2018-03-20: 2 mg via INTRAVENOUS

## 2018-03-20 MED ORDER — 0.9 % SODIUM CHLORIDE (POUR BTL) OPTIME
TOPICAL | Status: DC | PRN
  Administered 2018-03-20: 200 mL

## 2018-03-20 MED ORDER — CEFAZOLIN SUBCONJUNCTIVAL INJECTION 100 MG/0.5 ML
200.0000 mg | INJECTION | SUBCONJUNCTIVAL | Status: AC
  Administered 2018-03-20: 200 mg via SUBCONJUNCTIVAL
  Filled 2018-03-20: qty 2

## 2018-03-20 MED ORDER — TROPICAMIDE 1 % OP SOLN
OPHTHALMIC | Status: AC
  Administered 2018-03-20: 1 [drp] via OPHTHALMIC
  Filled 2018-03-20: qty 15

## 2018-03-20 MED ORDER — EPINEPHRINE PF 1 MG/ML IJ SOLN
INTRAMUSCULAR | Status: AC
  Filled 2018-03-20: qty 1

## 2018-03-20 MED ORDER — BSS IO SOLN
INTRAOCULAR | Status: DC | PRN
  Administered 2018-03-20: 15 mL via INTRAOCULAR

## 2018-03-20 MED ORDER — HYALURONIDASE HUMAN 150 UNIT/ML IJ SOLN
INTRAMUSCULAR | Status: AC
  Filled 2018-03-20: qty 1

## 2018-03-20 MED ORDER — LIDOCAINE HCL 2 % IJ SOLN
INTRAMUSCULAR | Status: DC | PRN
  Administered 2018-03-20: 6 mL via RETROBULBAR

## 2018-03-20 MED ORDER — DEXTROSE 50 % IV SOLN
25.0000 mL | Freq: Once | INTRAVENOUS | Status: AC
  Administered 2018-03-20: 25 mL via INTRAVENOUS
  Filled 2018-03-20: qty 50

## 2018-03-20 MED ORDER — ONDANSETRON HCL 4 MG/2ML IJ SOLN
INTRAMUSCULAR | Status: DC | PRN
  Administered 2018-03-20: 4 mg via INTRAVENOUS

## 2018-03-20 MED ORDER — DEXAMETHASONE SODIUM PHOSPHATE 10 MG/ML IJ SOLN
INTRAMUSCULAR | Status: AC
  Filled 2018-03-20: qty 1

## 2018-03-20 MED ORDER — FENTANYL CITRATE (PF) 250 MCG/5ML IJ SOLN
INTRAMUSCULAR | Status: AC
  Filled 2018-03-20: qty 5

## 2018-03-20 MED ORDER — FENTANYL CITRATE (PF) 100 MCG/2ML IJ SOLN: 25.0000 ug | INTRAMUSCULAR | Status: DC | PRN

## 2018-03-20 MED ORDER — FENTANYL CITRATE (PF) 250 MCG/5ML IJ SOLN
INTRAMUSCULAR | Status: DC | PRN
  Administered 2018-03-20: 50 ug via INTRAVENOUS
  Administered 2018-03-20: 25 ug via INTRAVENOUS
  Administered 2018-03-20: 50 ug via INTRAVENOUS

## 2018-03-20 MED ORDER — SODIUM CHLORIDE 0.9 % IV SOLN
INTRAVENOUS | Status: DC
  Administered 2018-03-20: 14:00:00 via INTRAVENOUS

## 2018-03-20 MED ORDER — PROPOFOL 10 MG/ML IV BOLUS
INTRAVENOUS | Status: DC | PRN
  Administered 2018-03-20: 40 mg via INTRAVENOUS

## 2018-03-20 MED ORDER — ONDANSETRON HCL 4 MG/2ML IJ SOLN
INTRAMUSCULAR | Status: AC
  Filled 2018-03-20: qty 2

## 2018-03-20 MED ORDER — INDOCYANINE GREEN 25 MG IV SOLR
INTRAVENOUS | Status: AC
  Filled 2018-03-20: qty 25

## 2018-03-20 MED ORDER — EPINEPHRINE PF 1 MG/ML IJ SOLN
INTRAOCULAR | Status: DC | PRN
  Administered 2018-03-20: 500 mL

## 2018-03-20 MED ORDER — LIDOCAINE 2% (20 MG/ML) 5 ML SYRINGE
INTRAMUSCULAR | Status: AC
  Filled 2018-03-20: qty 5

## 2018-03-20 MED ORDER — BSS IO SOLN
INTRAOCULAR | Status: AC
  Filled 2018-03-20: qty 15

## 2018-03-20 MED ORDER — LIDOCAINE 2% (20 MG/ML) 5 ML SYRINGE
INTRAMUSCULAR | Status: DC | PRN
  Administered 2018-03-20: 20 mg via INTRAVENOUS

## 2018-03-20 MED ORDER — LIDOCAINE HCL 2 % IJ SOLN
INTRAMUSCULAR | Status: AC
  Filled 2018-03-20: qty 20

## 2018-03-20 MED ORDER — DEXTROSE 50 % IV SOLN
INTRAVENOUS | Status: AC
  Administered 2018-03-20: 25 mL via INTRAVENOUS
  Filled 2018-03-20: qty 50

## 2018-03-20 MED ORDER — PHENYLEPHRINE HCL 2.5 % OP SOLN
1.0000 [drp] | OPHTHALMIC | Status: AC | PRN
  Administered 2018-03-20 (×3): 1 [drp] via OPHTHALMIC

## 2018-03-20 MED ORDER — SODIUM HYALURONATE 10 MG/ML IO SOLN
INTRAOCULAR | Status: DC | PRN
  Administered 2018-03-20: 0.85 mL via INTRAOCULAR

## 2018-03-20 SURGICAL SUPPLY — 74 items
APPLICATOR COTTON TIP 6IN STRL (MISCELLANEOUS) ×3 IMPLANT
APPLICATOR DR MATTHEWS STRL (MISCELLANEOUS) ×12 IMPLANT
BANDAGE EYE OVAL (MISCELLANEOUS) IMPLANT
BLADE EYE CATARACT 19 1.4 BEAV (BLADE) IMPLANT
BLADE MVR KNIFE 19G (BLADE) IMPLANT
CANNULA ANT CHAM MAIN (OPHTHALMIC RELATED) IMPLANT
CANNULA DUAL BORE 23G (CANNULA) IMPLANT
CANNULA VLV SOFT TIP 25G (OPHTHALMIC) ×2 IMPLANT
CANNULA VLV SOFT TIP 25GA (OPHTHALMIC) ×3 IMPLANT
CAUTERY EYE LOW TEMP 1300F FIN (OPHTHALMIC RELATED) IMPLANT
COTTONBALL LRG STERILE PKG (GAUZE/BANDAGES/DRESSINGS) ×9 IMPLANT
COVER SURGICAL LIGHT HANDLE (MISCELLANEOUS) ×3 IMPLANT
COVER WAND RF STERILE (DRAPES) ×3 IMPLANT
DRAPE INCISE 51X51 W/FILM STRL (DRAPES) ×3 IMPLANT
DRAPE RETRACTOR (MISCELLANEOUS) ×3 IMPLANT
ERASER HMR WETFIELD 23G BP (MISCELLANEOUS) IMPLANT
FILTER BLUE MILLIPORE (MISCELLANEOUS) IMPLANT
FILTER STRAW FLUID ASPIR (MISCELLANEOUS) IMPLANT
FORCEPS GRIESHABER ILM 25G A (INSTRUMENTS) IMPLANT
GAS AUTO FILL CONSTEL (OPHTHALMIC)
GAS AUTO FILL CONSTELLATION (OPHTHALMIC) IMPLANT
GLOVE ECLIPSE 7.5 STRL STRAW (GLOVE) ×3 IMPLANT
GOWN STRL REUS W/ TWL LRG LVL3 (GOWN DISPOSABLE) ×4 IMPLANT
GOWN STRL REUS W/TWL LRG LVL3 (GOWN DISPOSABLE) ×2
HANDLE PNEUMATIC FOR CONSTEL (OPHTHALMIC) IMPLANT
KIT BASIN OR (CUSTOM PROCEDURE TRAY) ×3 IMPLANT
KIT PERFLUORON PROCEDURE 5ML (MISCELLANEOUS) IMPLANT
KIT TURNOVER KIT B (KITS) ×3 IMPLANT
KNIFE CRESCENT 1.75 EDGEAHEAD (BLADE) IMPLANT
KNIFE GRIESHABER SHARP 2.5MM (MISCELLANEOUS) ×3 IMPLANT
LENS BIOM SUPER VIEW SET DISP (OPHTHALMIC RELATED) ×3 IMPLANT
MICROPICK 25G (MISCELLANEOUS)
NDL 18GX1X1/2 (RX/OR ONLY) (NEEDLE) ×2 IMPLANT
NDL 25GX 5/8IN NON SAFETY (NEEDLE) ×2 IMPLANT
NDL FILTER BLUNT 18X1 1/2 (NEEDLE) ×2 IMPLANT
NDL HYPO 25GX1X1/2 BEV (NEEDLE) ×2 IMPLANT
NDL HYPO 30X.5 LL (NEEDLE) ×2 IMPLANT
NDL RETROBULBAR 25GX1.5 (NEEDLE) ×2 IMPLANT
NEEDLE 18GX1X1/2 (RX/OR ONLY) (NEEDLE) ×3 IMPLANT
NEEDLE 25GX 5/8IN NON SAFETY (NEEDLE) ×3 IMPLANT
NEEDLE FILTER BLUNT 18X 1/2SAF (NEEDLE) ×1
NEEDLE FILTER BLUNT 18X1 1/2 (NEEDLE) ×2 IMPLANT
NEEDLE HYPO 25GX1X1/2 BEV (NEEDLE) ×3 IMPLANT
NEEDLE HYPO 30X.5 LL (NEEDLE) ×3 IMPLANT
NEEDLE RETROBULBAR 25GX1.5 (NEEDLE) ×3 IMPLANT
NS IRRIG 1000ML POUR BTL (IV SOLUTION) ×3 IMPLANT
PACK VITRECTOMY CUSTOM (CUSTOM PROCEDURE TRAY) ×3 IMPLANT
PAD ARMBOARD 7.5X6 YLW CONV (MISCELLANEOUS) ×6 IMPLANT
PAK PIK VITRECTOMY CVS 25GA (OPHTHALMIC) ×3 IMPLANT
PICK MICROPICK 25G (MISCELLANEOUS) IMPLANT
PROBE LASER ILLUM FLEX CVD 25G (OPHTHALMIC) IMPLANT
REPL STRA BRUSH NDL (NEEDLE) IMPLANT
REPL STRA BRUSH NEEDLE (NEEDLE) IMPLANT
RESERVOIR BACK FLUSH (MISCELLANEOUS) IMPLANT
ROLLS DENTAL (MISCELLANEOUS) ×6 IMPLANT
SCRAPER DIAMOND 25GA (OPHTHALMIC RELATED) IMPLANT
SHEET MEDIUM DRAPE 40X70 STRL (DRAPES) ×3 IMPLANT
SOLUTION ANTI FOG 6CC (MISCELLANEOUS) ×3 IMPLANT
SPONGE SURGIFOAM ABS GEL 12-7 (HEMOSTASIS) IMPLANT
STOPCOCK 4 WAY LG BORE MALE ST (IV SETS) IMPLANT
SUT CHROMIC 7 0 TG140 8 (SUTURE) IMPLANT
SUT ETHILON 9 0 TG140 8 (SUTURE) IMPLANT
SUT MERSILENE 4 0 RV 2 (SUTURE) IMPLANT
SUT SILK 2 0 (SUTURE)
SUT SILK 2-0 18XBRD TIE 12 (SUTURE) IMPLANT
SUT SILK 4 0 RB 1 (SUTURE) IMPLANT
SYR 10ML LL (SYRINGE) ×3 IMPLANT
SYR 20CC LL (SYRINGE) IMPLANT
SYR 5ML LL (SYRINGE) ×3 IMPLANT
SYR BULB 3OZ (MISCELLANEOUS) ×3 IMPLANT
SYR TB 1ML LUER SLIP (SYRINGE) IMPLANT
TUBING HIGH PRESS EXTEN 6IN (TUBING) IMPLANT
WATER STERILE IRR 1000ML POUR (IV SOLUTION) ×3 IMPLANT
WIPE INSTRUMENT VISIWIPE 73X73 (MISCELLANEOUS) IMPLANT

## 2018-03-20 NOTE — Transfer of Care (Signed)
Immediate Anesthesia Transfer of Care Note  Patient: [name]  Procedure(s) Performed: RIGHT EYE VITRECTOMY WITH  ENDOLASER PARENTAL PHOTOCOAGULATION 25 GAUGE (Right ) PHOTOCOAGULATION WITH LASER (Right Eye)  Patient Location: PACU  Anesthesia Type:MAC  Level of Consciousness: awake, alert  and oriented  Airway & Oxygen Therapy: Patient Spontanous Breathing and Patient connected to nasal cannula oxygen  Post-op Assessment: Report given to RN and Post -op Vital signs reviewed and stable  Post vital signs: Reviewed and stable  Last Vitals:  Vitals Value Taken Time  BP 149/63 03/20/2018  6:27 PM  Temp    Pulse 66 03/20/2018  6:28 PM  Resp 13 03/20/2018  6:28 PM  SpO2 97 % 03/20/2018  6:28 PM  Vitals shown include unvalidated device data.  Last Pain:  Vitals:   03/20/18 1341  TempSrc:   PainSc: 0-No pain         Complications: No apparent anesthesia complications

## 2018-03-20 NOTE — Discharge Instructions (Addendum)
DO NOT SLEEP ON BACK, THE EYE PRESSURE CAN GO UP AND CAUSE VISION LOSS   SLEEP ON SIDE WITH NOSE TO PILLOW  DURING DAY KEEP UPRIGHT 

## 2018-03-20 NOTE — Brief Op Note (Signed)
03/20/2018  6:20 PM  PATIENT:  [name]  68 y.o. female  PRE-OPERATIVE DIAGNOSIS:  VITREOUS HEMORRHAGE right eye  POST-OPERATIVE DIAGNOSIS:  VITREOUS HEMORRHAGE right eye  PROCEDURE:  Procedure(s): RIGHT EYE VITRECTOMY WITH  ENDOLASERPHOTOCOAGULATION 25 GAUGE (Right)   SURGEON:  Surgeon(s) and Role:    * Jalene Mullet, MD - Primary  PHYSICIAN ASSISTANT:   ASSISTANTS: none   ANESTHESIA:   local and MAC  EBL:  minimal   BLOOD ADMINISTERED:none  DRAINS: none   LOCAL MEDICATIONS USED:  MARCAINE    and LIDOCAINE   SPECIMEN:  No Specimen  DISPOSITION OF SPECIMEN:  N/A  COUNTS:  YES  TOURNIQUET:  * No tourniquets in log *  DICTATION: .Note written in paper chart  PLAN OF CARE: Discharge to home after PACU  PATIENT DISPOSITION:  PACU - hemodynamically stable.   Delay start of Pharmacological VTE agent (>24hrs) due to surgical blood loss or risk of bleeding: not applicable

## 2018-03-20 NOTE — Op Note (Signed)
[name] 03/20/2018 Diagnosis: vitreous hemorrhage right eye  Procedure: Pars Plana Vitrectomy and Endolaser Operative Eye:  right eye  Surgeon: Harrold Donath Estimated Blood Loss: minimal Specimens for Pathology:  None Complications: none   The  patient was prepped and draped in the usual fashion for ocular surgery on the  right eye .  A lid speculum was placed.  Infusion line and trocar was placed at the 4 o'clock position approximately 3.5 mm from the surgical limbus.   The infusion line was allowed to run and then clamped when placed at the cannula opening. The line was inserted and secured to the drape with an adhesive strip.   Active trocars/cannula were placed at the 10 and 2 o'clock positions approximately 3.5 mm from the surgical limbus. The cannula was visualized in the vitreous cavity.  The light pipe and vitreous cutter were inserted into the vitreous cavity and a core vitrectomy was performed.  Care taken to remove the vitreous up to the vitreous base for 360 degrees.   3 rows of endolaser were applied 360 degrees to the periphery.  A partial air-fluid exchange was performed.  The superior cannulas were sequentially removed with concommitant tamponade using a cotton tipped applicator and noted to be air tight.  The infusion line and trocar were removed and the sclerotomy was noted to be air tight with normal intraocular pressure by digital palpapation.  Subconjunctival injections of  Dexamethasone 4mg /51ml was placed in the infero-medial quadrant.   The speculum and drapes were removed and the eye was patched with Polymixin/Bacitracin ophthalmic ointment. An eye shield was placed and the patient was transferred alert and conversant with stable vital signs to the post operative recovery area.  The patient tolerated the procedure well and no complications were noted.  Harrold Donath MD

## 2018-03-20 NOTE — Anesthesia Postprocedure Evaluation (Signed)
Anesthesia Post Note  Patient: IMANNI BURDINE  Procedure(s) Performed: RIGHT EYE VITRECTOMY WITH  ENDOLASER PARENTAL PHOTOCOAGULATION 25 GAUGE (Right ) PHOTOCOAGULATION WITH LASER (Right Eye)     Patient location during evaluation: PACU Anesthesia Type: MAC Level of consciousness: awake and alert Pain management: pain level controlled Vital Signs Assessment: post-procedure vital signs reviewed and stable Respiratory status: spontaneous breathing, nonlabored ventilation, respiratory function stable and patient connected to nasal cannula oxygen Cardiovascular status: stable and blood pressure returned to baseline Postop Assessment: no apparent nausea or vomiting Anesthetic complications: no    Last Vitals:  Vitals:   03/20/18 1830 03/20/18 1843  BP: (!) 149/63 (!) 141/62  Pulse: 66 65  Resp: 18 14  Temp:  36.5 C  SpO2: 93% 92%    Last Pain:  Vitals:   03/20/18 1855  TempSrc:   PainSc: 0-No pain                 Tiajuana Amass

## 2018-03-20 NOTE — Progress Notes (Signed)
Office: [phone]  /  Fax: [phone]   HPI:   Chief Complaint: OBESITY [name] is here to discuss her progress with her obesity treatment plan. She is on the lower carbohydrate, vegetable, and lean protein rich diet plan and is following her eating plan approximately 50 to 60 % of the time. She states she is exercising 0 minutes 0 times per week. [name] has had a few health issues over the holidays and was unable to stay on the low carb plan strictly. She would like to get back on track with her plan.  Her weight is 254 lb (115.2 kg) today and has had a weight gain of 3 pounds over a period of 4 weeks since her last visit. She has lost 11 lbs since starting treatment with [name].  Vitamin D deficiency [name] has a diagnosis of vitamin D deficiency. She states that her last vitamin D level at her PCP was 47 and that she was instructed to stop prescription vitamin D and to start OTC vitamin D. She denies nausea, vomiting, or muscle weakness.  ASSESSMENT AND PLAN:  Vitamin D deficiency  Class 3 severe obesity with serious comorbidity and body mass index (BMI) of 45.0 to 49.9 in adult, unspecified obesity type (HCC)  PLAN:  Vitamin D Deficiency [name] was informed that low vitamin D levels contributes to fatigue and are associated with obesity, breast, and colon cancer. She agrees to continue to take OTC Vit D @1 ,000 IU every day and will follow up for routine testing of vitamin D, at least 2-3 times per year. She was informed of the risk of over-replacement of vitamin D and agrees to not increase her dose unless she discusses this with [name] first. We will recheck labs in 3 months and she agrees to follow up for an office visit in 4 weeks.  I spent > than 50% of the 25 minute visit on counseling as documented in the note.  Obesity [name] is currently in the action stage of change. As such, her goal is to continue with weight loss efforts. She has agreed to change to follow the Category 2  plan. [name] has been instructed to work up to a goal of 150 minutes of combined cardio and strengthening exercise per week for weight loss and overall health benefits. We discussed the following Behavioral Modification Strategies today: increasing lean protein intake, decreasing simple carbohydrates, work on meal planning and easy cooking plans, and emotional eating strategies.  [name] has agreed to follow up with our clinic in 4 weeks. She was informed of the importance of frequent follow up visits to maximize her success with intensive lifestyle modifications for her multiple health conditions.  ALLERGIES: Allergies  Allergen Reactions  . Actos [Pioglitazone Hydrochloride] Swelling and Other (See Comments)    Numbness in lips, HEADACHES SWELLING REACTION UNSPECIFIED   . Avandia [Rosiglitazone] Swelling    Numbness in lips, headaches  SWELLING REACTION UNSPECIFIED   . Tetanus Toxoids Other (See Comments)    Arm swelling, fainted, ?Respiratory distress  (horse serum)  . Food Nausea Only    Eggplant    MEDICATIONS: Current Outpatient Medications on File Prior to Visit  Medication Sig Dispense Refill  . amLODipine (NORVASC) 5 MG tablet Take 5 mg by mouth every evening.     [name]. amoxicillin (AMOXIL) 500 MG capsule Take 2,000 mg by mouth as directed. Take 2000 mg 1 hour prior to dental work  0  . atorvastatin (LIPITOR) 40 MG tablet Take 40 mg by  mouth every evening.     [name]. buPROPion (WELLBUTRIN SR) 150 MG 12 hr tablet Take 1 tablet (150 mg total) by mouth daily. 30 tablet 0  . cholecalciferol (VITAMIN D) 1000 units tablet Take 1,000 Units by mouth daily.     . cholecalciferol (VITAMIN D3) 25 MCG (1000 UT) tablet Take 1,000 Units by mouth daily.    . DULoxetine (CYMBALTA) 60 MG capsule Take 60 mg by mouth daily.    . flecainide (TAMBOCOR) 50 MG tablet TAKE 1 TABLET BY MOUTH TWICE A DAY 180 tablet 2  . hydrochlorothiazide (MICROZIDE) 12.5 MG capsule Take 12.5 mg by mouth daily.   3  .  HYDROcodone-acetaminophen (NORCO/VICODIN) 5-325 MG tablet TAKE 1 TABLET BY MOUTH EVERY 6 HOURS AS NEEDED FOR MODERATE PAIN. (Patient taking differently: Take 1 tablet by mouth every 6 (six) hours as needed for moderate pain or severe pain. ) 30 tablet 0  . indomethacin (INDOCIN) 50 MG capsule Take 50 mg by mouth 3 (three) times daily as needed (gout).    . insulin glargine (LANTUS) 100 UNIT/ML injection Inject 40 Units into the skin 2 (two) times daily.     [name]. ketorolac (TORADOL) 10 MG tablet Take 10 mg by mouth 3 (three) times daily as needed (CHEST WALL PAIN/PLURACY).    [name]. levothyroxine (SYNTHROID, LEVOTHROID) 125 MCG tablet Take 125 mcg by mouth daily before breakfast.     . Melatonin 5 MG CAPS Take 5-10 mg by mouth at bedtime as needed (sleep).     . metFORMIN (GLUCOPHAGE-XR) 500 MG 24 hr tablet Take 1,000 mg by mouth daily with supper.    . methocarbamol (ROBAXIN) 500 MG tablet TAKE 1 TABLET BY MOUTH EVERY 6 HOURS AS NEEDED FOR MUSCLE SPASMS (Patient taking differently: Take 500 mg by mouth every 6 (six) hours as needed for muscle spasms. ) 60 tablet 0  . metoprolol tartrate (LOPRESSOR) 25 MG tablet Take 25 mg by mouth 2 (two) times daily.    [name]. omeprazole (PRILOSEC) 20 MG capsule Take 20 mg by mouth daily.     . TRULICITY 1.5 MG/0.5ML SOPN Inject 1.5 mg as directed every Sunday.   11  . valsartan (DIOVAN) 320 MG tablet Take 320 mg by mouth daily.   3  . XARELTO 20 MG TABS tablet TAKE 1 TABLET BY MOUTH ONCE DAILY WITH SUPPER (Patient taking differently: Take 20 mg by mouth daily with supper. ) 30 tablet 9   No current facility-administered medications on file prior to visit.     PAST MEDICAL HISTORY: Past Medical History:  Diagnosis Date  . A-fib (HCC)   . Anemia    hx of  . Arthritis   . Chest pain   . Constipation   . Depression   . Diabetes mellitus ORAL MEDS  . Diabetic retinopathy (HCC)   . Dyspnea    with minimal exertion-deconditioned  . Dyspnea   . Dysrhythmia    a-fib  .  Food allergy   . GERD (gastroesophageal reflux disease)    occasional  . Gout   . History of kidney stones   . Hypercholesteremia   . Hyperlipidemia   . Hypertension   . Hypertensive kidney disease   . Hypothyroidism   . Knee pain, right   . 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 Obesity   . OSA on CPAP    uses CPAP nightly  . Osteoarthritis   . Palpitations   . Pinched nerve in neck   .  Pleurisy   . Pneumonia    hx of  . PONV (postoperative nausea and vomiting)    for 3-4 days after general anesthesia  . Sciatica   . Swelling of knee joint, right   . Synovitis of knee RIGHT  . Trigger finger, left    left index  . Vitamin D deficiency     PAST SURGICAL HISTORY: Past Surgical History:  Procedure Laterality Date  . CESAREAN SECTION  X3  . KNEE ARTHROSCOPY  04/20/2011   Procedure: ARTHROSCOPY KNEE;  Surgeon: Loanne Drilling, MD;  Location: [facility];  Service: Orthopedics;  Laterality: Right;  WITH SYNOVECTOMY  . LEFT CARPAL TUNNEL / LEFT MIDDLE & RING FINGER TRIGGER RELEASE  08-26-2008  . LEFT SHOULDER ARTHROSCOPY W/ DEBRIDEMENT  09-09-2003  . LEFT SHOULDER ARTHROSCOPY/ LEFT THUMB TRIGGER RELEASE  02-22-2005  . PULLEY RELEASE LEFT LONG FINGER  07-14-2009  . RIGHT CARPAL TUNNEL/ RIGHT THUMB TRIGGER RELEASE'S  11-28-2006  . RIGHT SHOULDER ARTHROSCOPY W/ ROTATOR CUFF REPAIR  01-13-2004  . SHOULDER ARTHROSCOPY DISTAL CLAVICLE EXCISION AND OPEN ROTATOR CUFF REPAIR  09-07-2004   LEFT  . SHOULDER ARTHROSCOPY W/ ACROMIAL REPAIR  11-29-2005   LEFT  . TONSILLECTOMY AND ADENOIDECTOMY  child  . TOTAL KNEE ARTHROPLASTY  12-14-2009   RIGHT  . TOTAL KNEE ARTHROPLASTY Left 11/05/2012   Procedure: LEFT TOTAL KNEE ARTHROPLASTY;  Surgeon: Loanne Drilling, MD;  Location: [facility];  Service: Orthopedics;  Laterality: Left;  . TOTAL KNEE REVISION Right 07/29/2016   Procedure: RIGHT KNEE POLY-LINER EXCHANGE;  Surgeon: Kathryne Hitch, MD;  Location: [facility];   Service: Orthopedics;  Laterality: Right;  . TRIGGER FINGER RELEASE Right 02/21/2013   Procedure: RIGHT RING A-1 PULLEY RELEASE    (MINOR PROCEDURE) ;  Surgeon: Wyn Forster., MD;  Location: [facility];  Service: Orthopedics;  Laterality: Right;  . TRIGGER FINGER RELEASE Left 09/22/2016   Procedure: RELEASE TRIGGER FINGER LEFT INDEX FINGER;  Surgeon: Kathryne Hitch, MD;  Location: [facility];  Service: Orthopedics;  Laterality: Left;  . TRIGGER FINGER RELEASE Right 01/20/2017   Procedure: RELEASE TRIGGER FINGER/A-1 PULLEY RIGHT INDEX FINGER;  Surgeon: Marcene Corning, MD;  Location: [facility];  Service: Orthopedics;  Laterality: Right;    SOCIAL HISTORY: Social History   Tobacco Use  . Smoking status: Former Smoker    Years: 5.00    Types: Cigarettes    Last attempt to quit: 04/12/1977    Years since quitting: 40.9  . Smokeless tobacco: Never Used  Substance Use Topics  . Alcohol use: No  . Drug use: No    FAMILY HISTORY: Family History  Problem Relation Age of Onset  . Diabetes Mother   . Hypertension Mother   . Heart attack Mother   . Thyroid disease Mother   . Obesity Mother   . Cancer Father   . Liver disease Father   . Sleep apnea Father   . Alcoholism Father     ROS: Review of Systems  Constitutional: Negative for weight loss.  Gastrointestinal: Negative for nausea and vomiting.  Musculoskeletal:       Negative for muscle weakness.   PHYSICAL EXAM: Blood pressure (!) 153/64, pulse 61, temperature 98 F (36.7 C), temperature source Oral, height 5\' 1"  (1.549 m), weight 254 lb (115.2 kg), SpO2 96 %. Body mass index is 47.99 kg/m. Physical Exam Vitals signs reviewed.  Constitutional:      Appearance: Normal appearance. She is  obese.  Cardiovascular:     Rate and Rhythm: Normal rate.  Pulmonary:     Effort: Pulmonary effort is normal.  Musculoskeletal: Normal range of motion.  Skin:    General: Skin is warm and dry.    Neurological:     Mental Status: She is alert and oriented to person, place, and time.  Psychiatric:        Mood and Affect: Mood normal.        Behavior: Behavior normal.    RECENT LABS AND TESTS: BMET    Component Value Date/Time   NA 142 11/28/2017 1121   K 4.3 11/28/2017 1121   CL 106 11/28/2017 1121   CO2 19 (L) 11/28/2017 1121   GLUCOSE 91 11/28/2017 1121   GLUCOSE 214 (H) 01/17/2017 1459   BUN 25 11/28/2017 1121   CREATININE 1.12 (H) 11/28/2017 1121   CALCIUM 8.6 (L) 11/28/2017 1121   GFRNONAA 51 (L) 11/28/2017 1121   GFRAA 58 (L) 11/28/2017 1121   Lab Results  Component Value Date   HGBA1C 7.4 (H) 11/28/2017   HGBA1C 6.8 (H) 07/18/2017   HGBA1C 7.7 (H) 07/20/2016   HGBA1C 7.9 09/18/2014   No results found for: INSULIN CBC    Component Value Date/Time   WBC 10.0 07/18/2017 1112   WBC 8.6 09/22/2016 0733   RBC 4.57 07/18/2017 1112   RBC 4.31 09/22/2016 0733   HGB 13.0 07/18/2017 1112   HCT 39.6 07/18/2017 1112   PLT 284 09/22/2016 0733   MCV 87 07/18/2017 1112   MCH 28.4 07/18/2017 1112   MCH 28.5 09/22/2016 0733   MCHC 32.8 07/18/2017 1112   MCHC 31.4 09/22/2016 0733   RDW 15.3 07/18/2017 1112   LYMPHSABS 1.8 07/18/2017 1112   EOSABS 0.1 07/18/2017 1112   BASOSABS 0.0 07/18/2017 1112   Iron/TIBC/Ferritin/ %Sat No results found for: IRON, TIBC, FERRITIN, IRONPCTSAT Lipid Panel     Component Value Date/Time   CHOL 126 07/18/2017 1112   TRIG 98 07/18/2017 1112   HDL 43 07/18/2017 1112   LDLCALC 63 07/18/2017 1112   Hepatic Function Panel     Component Value Date/Time   PROT 6.5 11/28/2017 1121   ALBUMIN 3.7 11/28/2017 1121   AST 25 11/28/2017 1121   ALT 24 11/28/2017 1121   ALKPHOS 74 11/28/2017 1121   BILITOT 0.3 11/28/2017 1121      Component Value Date/Time   TSH 3.740 07/18/2017 1112   Results for ALEXIAH, KOROMA (MRN [id number]) as of 03/20/2018 08:38  Ref. Range 11/28/2017 11:21  Vitamin D, 25-Hydroxy Latest Ref Range: 30.0 - 100.0  ng/mL 31.4     OBESITY BEHAVIORAL INTERVENTION VISIT  Today's visit was # 12   Starting weight: 265 lbs Starting date: 07/18/17 Today's weight : Weight: 254 lb (115.2 kg)  Today's date: 03/19/2018 Total lbs lost to date: 33  ASK: We discussed the diagnosis of obesity with [name] today and [name] agreed to give [name] permission to discuss obesity behavioral modification therapy today.  ASSESS: [name] has the diagnosis of obesity and her BMI today is 48.0. [name] is in the action stage of change.   ADVISE: [name] was educated on the multiple health risks of obesity as well as the benefit of weight loss to improve her health. She was advised of the need for long term treatment and the importance of lifestyle modifications to improve her current health and to decrease her risk of future health problems.  AGREE: Multiple dietary modification options  and treatment options were discussed and [name] agreed to follow the recommendations documented in the above note.  ARRANGE: [name] was educated on the importance of frequent visits to treat obesity as outlined per CMS and USPSTF guidelines and agreed to schedule her next follow up appointment today.  I, Kirke Corin, am acting as transcriptionist for Wilder Glade, MD I have reviewed the above documentation for accuracy and completeness, and I agree with the above. -Quillian Quince, MD

## 2018-03-21 ENCOUNTER — Encounter (HOSPITAL_COMMUNITY): Payer: Self-pay | Admitting: Ophthalmology

## 2018-03-21 MED FILL — OFLOXACIN 0.3% EYE DROPS: 0.3 | 25 days supply | Qty: 5 | Fill #0

## 2018-03-21 MED FILL — PREDNISOLONE AC 1% EYE DROP: 1 | 28 days supply | Qty: 5 | Fill #0

## 2018-03-26 ENCOUNTER — Other Ambulatory Visit: Payer: Self-pay | Admitting: Internal Medicine

## 2018-03-26 DIAGNOSIS — M8580 Other specified disorders of bone density and structure, unspecified site: Secondary | ICD-10-CM

## 2018-03-26 MED FILL — OMEPRAZOLE 20 MG CPDR: 20 | 90 days supply | Qty: 90 | Fill #2

## 2018-03-26 MED FILL — LEVOTHYROXINE 125 MCG TABLE: 125 | 30 days supply | Qty: 30 | Fill #5

## 2018-03-26 MED FILL — XARELTO 20 MG TABLET: 20 | 30 days supply | Qty: 30 | Fill #5

## 2018-04-02 DIAGNOSIS — M25571 Pain in right ankle and joints of right foot: Secondary | ICD-10-CM | POA: Diagnosis not present

## 2018-04-04 ENCOUNTER — Other Ambulatory Visit: Payer: Self-pay | Admitting: Internal Medicine

## 2018-04-04 DIAGNOSIS — E113312 Type 2 diabetes mellitus with moderate nonproliferative diabetic retinopathy with macular edema, left eye: Secondary | ICD-10-CM | POA: Diagnosis not present

## 2018-04-04 DIAGNOSIS — E113511 Type 2 diabetes mellitus with proliferative diabetic retinopathy with macular edema, right eye: Secondary | ICD-10-CM | POA: Diagnosis not present

## 2018-04-04 DIAGNOSIS — Z13820 Encounter for screening for osteoporosis: Secondary | ICD-10-CM

## 2018-04-05 ENCOUNTER — Ambulatory Visit
Admission: RE | Admit: 2018-04-05 | Discharge: 2018-04-05 | Disposition: A | Discharge status: Home / Self Care | Payer: 59 | Source: Ambulatory Visit | Attending: Internal Medicine | Admitting: Internal Medicine

## 2018-04-05 ENCOUNTER — Encounter

## 2018-04-05 DIAGNOSIS — Z780 Asymptomatic menopausal state: Secondary | ICD-10-CM | POA: Diagnosis not present

## 2018-04-05 DIAGNOSIS — Z1231 Encounter for screening mammogram for malignant neoplasm of breast: Secondary | ICD-10-CM

## 2018-04-05 DIAGNOSIS — Z13820 Encounter for screening for osteoporosis: Secondary | ICD-10-CM

## 2018-04-05 DIAGNOSIS — M85851 Other specified disorders of bone density and structure, right thigh: Secondary | ICD-10-CM | POA: Diagnosis not present

## 2018-04-10 ENCOUNTER — Ambulatory Visit: Payer: 59 | Attending: Orthopaedic Surgery | Admitting: Physical Therapy

## 2018-04-10 ENCOUNTER — Other Ambulatory Visit: Payer: Self-pay

## 2018-04-10 ENCOUNTER — Encounter: Payer: Self-pay | Admitting: Physical Therapy

## 2018-04-10 DIAGNOSIS — M25571 Pain in right ankle and joints of right foot: Secondary | ICD-10-CM | POA: Insufficient documentation

## 2018-04-10 DIAGNOSIS — M6281 Muscle weakness (generalized): Secondary | ICD-10-CM | POA: Insufficient documentation

## 2018-04-10 DIAGNOSIS — R600 Localized edema: Secondary | ICD-10-CM | POA: Diagnosis not present

## 2018-04-10 DIAGNOSIS — M25671 Stiffness of right ankle, not elsewhere classified: Secondary | ICD-10-CM | POA: Diagnosis not present

## 2018-04-10 MED FILL — TRULICITY 1.5 MG/0.5 ML PEN: 1.5 | 28 days supply | Qty: 2 | Fill #0

## 2018-04-10 NOTE — Patient Instructions (Signed)

## 2018-04-10 NOTE — Therapy (Signed)
[facility] [address], [address] [place], [place], [address] Phone: [phone]   Fax:  [phone]  Physical Therapy Evaluation  Patient Details  Name: Stefanie DeemBonnie D Weiler MRN: [id number] Date of Birth: 1950-07-30 Referring Provider (PT): Dr. Jerl Santosalldorf   Encounter Date: 04/10/2018  PT End of Session - 04/10/18 1918    Visit Number  1    Date for PT Re-Evaluation  06/05/18    Authorization Type  UMR    PT Start Time  1534    PT Stop Time  1617    PT Time Calculation (min)  43 min    Activity Tolerance  Patient tolerated treatment well       Past Medical History:  Diagnosis Date  . A-fib (HCC)   . Anemia    hx of  . Arthritis   . Chest pain   . Constipation   . Depression   . Diabetes mellitus ORAL MEDS  . Diabetic retinopathy (HCC)   . Dyspnea    with minimal exertion-deconditioned  . Dyspnea   . Dysrhythmia    a-fib  . Food allergy   . GERD (gastroesophageal reflux disease)    occasional  . Gout   . History of kidney stones   . Hypercholesteremia   . Hyperlipidemia   . Hypertension   . Hypertensive kidney disease   . Hypothyroidism   . Knee pain, right   . 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 Obesity   . OSA on CPAP    uses CPAP nightly  . Osteoarthritis   . Palpitations   . Pinched nerve in neck   . Pleurisy   . Pneumonia    hx of  . PONV (postoperative nausea and vomiting)    for 3-4 days after general anesthesia  . Sciatica   . Swelling of knee joint, right   . Synovitis of knee RIGHT  . Trigger finger, left    left index  . Vitamin D deficiency     Past Surgical History:  Procedure Laterality Date  . CESAREAN SECTION  X3  . KNEE ARTHROSCOPY  04/20/2011   Procedure: ARTHROSCOPY KNEE;  Surgeon: Loanne DrillingFrank V Aluisio, MD;  [facility];  Service: Orthopedics;  Laterality: Right;  WITH SYNOVECTOMY  . LEFT CARPAL TUNNEL / LEFT MIDDLE & RING FINGER TRIGGER RELEASE   08-26-2008  . LEFT SHOULDER ARTHROSCOPY W/ DEBRIDEMENT  09-09-2003  . LEFT SHOULDER ARTHROSCOPY/ LEFT THUMB TRIGGER RELEASE  02-22-2005  . PHOTOCOAGULATION WITH LASER Right 03/20/2018   Procedure: PHOTOCOAGULATION WITH LASER;  Surgeon: Carmela RimaPatel, Narendra, MD;  [facility];  Service: Ophthalmology;  Laterality: Right;  . PULLEY RELEASE LEFT LONG FINGER  07-14-2009  . RIGHT CARPAL TUNNEL/ RIGHT THUMB TRIGGER RELEASE'S  11-28-2006  . RIGHT SHOULDER ARTHROSCOPY W/ ROTATOR CUFF REPAIR  01-13-2004  . SHOULDER ARTHROSCOPY DISTAL CLAVICLE EXCISION AND OPEN ROTATOR CUFF REPAIR  09-07-2004   LEFT  . SHOULDER ARTHROSCOPY W/ ACROMIAL REPAIR  11-29-2005   LEFT  . TONSILLECTOMY AND ADENOIDECTOMY  child  . TOTAL KNEE ARTHROPLASTY  12-14-2009   RIGHT  . TOTAL KNEE ARTHROPLASTY Left 11/05/2012   Procedure: LEFT TOTAL KNEE ARTHROPLASTY;  Surgeon: Loanne DrillingFrank V Aluisio, MD;  Location: [facility];  Service: Orthopedics;  Laterality: Left;  . TOTAL KNEE REVISION Right 07/29/2016   Procedure: RIGHT KNEE POLY-LINER EXCHANGE;  Surgeon: Kathryne HitchBlackman, Christopher Y, MD;  Location: [facility];  Service: Orthopedics;  Laterality: Right;  . TRIGGER FINGER  RELEASE Right 02/21/2013   Procedure: RIGHT RING A-1 PULLEY RELEASE    (MINOR PROCEDURE) ;  Surgeon: Wyn Forster., MD;  Location: [facility];  Service: Orthopedics;  Laterality: Right;  . TRIGGER FINGER RELEASE Left 09/22/2016   Procedure: RELEASE TRIGGER FINGER LEFT INDEX FINGER;  Surgeon: Kathryne Hitch, MD;  Location: [facility];  Service: Orthopedics;  Laterality: Left;  . TRIGGER FINGER RELEASE Right 01/20/2017   Procedure: RELEASE TRIGGER FINGER/A-1 PULLEY RIGHT INDEX FINGER;  Surgeon: Marcene Corning, MD;  Location: [facility];  Service: Orthopedics;  Laterality: Right;  . VITRECTOMY 25 GAUGE WITH SCLERAL BUCKLE Right 03/20/2018   Procedure: RIGHT EYE VITRECTOMY WITH  ENDOLASER PARENTAL PHOTOCOAGULATION 25 GAUGE;  Surgeon: Carmela Rima, MD;   Location: [facility] OR;  Service: Ophthalmology;  Laterality: Right;    There were no vitals filed for this visit.   Subjective Assessment - 04/10/18 1537    Subjective   Went to [facility] before Thanksgiving and had increased swelling and pain.  No specific injury.   Saw Dr. Jerl Santos.  Lateral ligament injury.  Got a lace up brace but did not help.  Followed up 3 weeks later, still the same.  Still swells overnight, some better in the morning.  Did injection 1 month dorsal foot no relief.  Most of pain in lateral ankle.      Pertinent History  Eye surgery 3 weeks ago vitreal hemorrhage going to have injection tomorrow, no jumping/running, no pool;  steroid drops in eyes;  recent bone density shows moderate osteopenia; lost 27#;  low back problems waiting to have ESI for spinal stenosis;  right TKR some pain/weakness    Limitations  Walking    How long can you walk comfortably?  1/4 mile    Diagnostic tests  Negative x-rays    Patient Stated Goals  make it stop    Currently in Pain?  Yes    Pain Score  3     Pain Location  Ankle    Pain Orientation  Right;Lateral    Pain Type  Chronic pain    Pain Onset  More than a month ago    Pain Frequency  Intermittent    Aggravating Factors   dorsiflex, plantarflex, inversion; walking    Pain Relieving Factors  ice helps temporarily; occassional ibupofen          [facility] PT Assessment - 04/10/18 0001      Assessment   Medical Diagnosis  right lateral ankle pain     Referring Provider (PT)  Dr. Jerl Santos    Onset Date/Surgical Date  --   Thanksgiving   Next MD Visit  mid March     Prior Therapy  for back, knee      Precautions   Precautions  Other (comment)    Precaution Comments  no running/lifting; no pool       Restrictions   Weight Bearing Restrictions  No      Balance Screen   Has the patient fallen in the past 6 months  No    Has the patient had a decrease in activity level because of a fear of falling?   No    Is the patient reluctant  to leave their home because of a fear of falling?   No      Home Environment   Living Environment  Private residence    Type of Home  House    Home Access  Stairs to  enter    Home Layout  One level      Observation/Other Assessments   Focus on Therapeutic Outcomes (FOTO)   46% limitation       Figure 8 Edema   Figure 8 - Right   52 1/2 cm    Figure 8 - Left   50 1/2 cm       Posture/Postural Control   Posture Comments  right lateral ankle swelling; bil pes planus      AROM   Overall AROM Comments  decreased calcaneal mobility     Right Ankle Dorsiflexion  0   endrange pain    Right Ankle Plantar Flexion  65   pain   Right Ankle Inversion  29   pain   Right Ankle Eversion  14   pain   Left Ankle Dorsiflexion  5    Left Ankle Plantar Flexion  65    Left Ankle Inversion  38    Left Ankle Eversion  30      Strength   Overall Strength Comments  SLS 2-3 sec right/left;  painful with bil heel raise needs UE support    Right Ankle Dorsiflexion  3+/5    Right Ankle Plantar Flexion  3+/5    Right Ankle Inversion  3+/5    Right Ankle Eversion  3+/5    Left Ankle Dorsiflexion  4/5    Left Ankle Plantar Flexion  4/5    Left Ankle Inversion  4/5    Left Ankle Eversion  4/5      Palpation   Palpation comment  tender over lateral malleolus, posterior tibialis, gastroc and soleus      Tinel's test - Post Tibialis    Findings  Positive    Side  Right                Objective measurements completed on examination: See above findings.      [facility] Adult PT Treatment/Exercise - 04/10/18 0001      Iontophoresis   Type of Iontophoresis  Dexamethasone    Location  right lateral ankle    Dose  4 mg/ml     Time  4-6 hour patch              PT Education - 04/10/18 1917    Education Details  verbal only ionto instructions secondary to EPIC system down     Person(s) Educated  Patient    Methods  Explanation    Comprehension  Verbalized understanding       PT  Short Term Goals - 04/10/18 1931      PT SHORT TERM GOAL #1   Title  be independent in initial HEP    Time  4    Period  Weeks    Status  New    Target Date  05/08/18      PT SHORT TERM GOAL #2   Title  report a 25% reduction in right ankle pain  with standing and walking    Time  4    Period  Weeks    Status  On-going      PT SHORT TERM GOAL #3   Title  The patient will have improved ankle dorsiflexion to 3 degrees, inversion to 33 degrees and eversion to 20 degrees  needed for walking and climbing stairs.      Time  4    Period  Weeks    Status  New      PT SHORT  TERM GOAL #4   Title  Decreased figure 8 girth measurement to 51 1/2 indicating decreased swelling    Time  4    Period  Weeks    Status  New      PT SHORT TERM GOAL #5   Title  ...        PT Long Term Goals - 04/10/18 1938      PT LONG TERM GOAL #1   Title  be independent in advanced HEP    Time  8    Period  Weeks    Status  New    Target Date  06/05/18      PT LONG TERM GOAL #2   Title  reduce FOTO to < or = 42% limitation    Time  8    Period  Weeks    Status  New      PT LONG TERM GOAL #3   Title  The patient will have improved ankle dorsiflexion to 5 degrees and inversion to 35 degrees, eversion to 25 degrees needed for ambulation, stairs    Time  8    Period  Weeks    Status  New      PT LONG TERM GOAL #4   Title  Right ankle strength grossly 4-/5 needed for walking and standing longer periods of time    Time  8    Period  Weeks    Status  New      PT LONG TERM GOAL #5   Title  The patient will report a 50% improvement in pain with walking 1/4 to 1/2 mile.      Time  8    Period  Weeks    Status  New             Plan - 04/10/18 1919    Clinical Impression Statement  The patient complains of right lateral ankle pain and swelling since around Thanksgiving with no known injury.  Her pain is aggravated with walking and with ankle dorsiflexion, plantarflexion and inversion.  She  has limited ROM in all planes of motion.  Decreased strength of the ankle grossly 3+/5.  She is painful and limited with heel raises as well as single leg standing.  Moderate swelling around lateral malleoli and approx 2 cm greater on right with fig 8 girth measurement.  Tenderness around lateral malleoli, posterior tibialis, gastoc and soleus muscles.  + Tinels.  Moderate functional disability per FOTO functional outcome measurement.  She would benefit from PT to address these deficits.      History and Personal Factors relevant to plan of care:  numerous co-morbidities including right TKR, osteopenia;  lumbar stenosis will have ESI soon;  recent eye surgery    Clinical Presentation  Stable    Clinical Presentation due to:  status unchanged over several months    Clinical Decision Making  Low    Rehab Potential  Good    Clinical Impairments Affecting Rehab Potential  osteopenia;  stenosis;  right TKR    PT Frequency  2x / week    PT Duration  8 weeks    PT Treatment/Interventions  ADLs/Self Care Home Management;Cryotherapy;Electrical Stimulation;Moist Heat;Iontophoresis 4mg /ml Dexamethasone;Ultrasound;Therapeutic exercise;Therapeutic activities;Neuromuscular re-education;Patient/family education;Manual techniques;Dry needling;Taping;Vasopneumatic Device    PT Next Visit Plan  ionto #2;  vasocompression;  instrument assisted soft tissue mobilization static and dynamic;  start HEP  (pt already doing band ex MD gave her) isometrics for strengthening;  kinesiotaping    Consulted  and Agree with Plan of Care  Patient       Patient will benefit from skilled therapeutic intervention in order to improve the following deficits and impairments:  Pain, Increased fascial restricitons, Decreased range of motion, Decreased strength, Impaired perceived functional ability, Difficulty walking  Visit Diagnosis: Pain in right ankle and joints of right foot - Plan: PT plan of care cert/re-cert  Stiffness of right  ankle, not elsewhere classified - Plan: PT plan of care cert/re-cert  Muscle weakness (generalized) - Plan: PT plan of care cert/re-cert  Localized edema - Plan: PT plan of care cert/re-cert     Problem List Patient Active Problem List   Diagnosis Date Noted  . Other fatigue 07/18/2017  . Shortness of breath on exertion 07/18/2017  . Type 2 diabetes mellitus with retinopathy, with long-term current use of insulin (HCC) 07/18/2017  . Vitamin D deficiency 07/18/2017  . B12 nutritional deficiency 07/18/2017  . Other specified hypothyroidism 07/18/2017  . Acute pain of right knee 12/22/2016  . Trigger index finger of left hand 09/22/2016  . 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 Diabetes (HCC) 01/08/2014  . Essential hypertension 01/08/2014  . Hyperlipidemia 01/08/2014  . Postoperative anemia due to acute blood loss 11/22/2012  . Hyponatremia 11/06/2012  . OA (osteoarthritis) of knee 11/05/2012  . Villonodular synovitis of knee 04/20/2011   Lavinia SharpsStacy Hunner Garcon, PT 04/10/18 7:46 PM Phone: [phone] Fax: [phone]  Vivien PrestoSimpson, Capri Veals C 04/10/2018, 7:45 PM  [facility] [address], [address] [place], [place], [address] Phone: [phone]   Fax:  [phone]  Name: Stefanie DeemBonnie D Gonzales MRN: [id number] Date of Birth: 1951-01-12

## 2018-04-11 DIAGNOSIS — E113511 Type 2 diabetes mellitus with proliferative diabetic retinopathy with macular edema, right eye: Secondary | ICD-10-CM | POA: Diagnosis not present

## 2018-04-16 ENCOUNTER — Ambulatory Visit (INDEPENDENT_AMBULATORY_CARE_PROVIDER_SITE_OTHER): Payer: 59 | Admitting: Family Medicine

## 2018-04-16 ENCOUNTER — Encounter (INDEPENDENT_AMBULATORY_CARE_PROVIDER_SITE_OTHER): Payer: Self-pay | Admitting: Family Medicine

## 2018-04-16 VITALS — BP 116/68 | HR 63 | Ht 61.0 in | Wt 252.0 lb

## 2018-04-16 DIAGNOSIS — M8580 Other specified disorders of bone density and structure, unspecified site: Secondary | ICD-10-CM

## 2018-04-16 DIAGNOSIS — Z6841 Body Mass Index (BMI) 40.0 and over, adult: Secondary | ICD-10-CM

## 2018-04-17 ENCOUNTER — Encounter: Payer: Self-pay | Admitting: Physical Therapy

## 2018-04-17 ENCOUNTER — Ambulatory Visit: Payer: 59 | Attending: Orthopaedic Surgery | Admitting: Physical Therapy

## 2018-04-17 DIAGNOSIS — R600 Localized edema: Secondary | ICD-10-CM

## 2018-04-17 DIAGNOSIS — M6281 Muscle weakness (generalized): Secondary | ICD-10-CM

## 2018-04-17 DIAGNOSIS — M25571 Pain in right ankle and joints of right foot: Secondary | ICD-10-CM

## 2018-04-17 DIAGNOSIS — M25671 Stiffness of right ankle, not elsewhere classified: Secondary | ICD-10-CM | POA: Diagnosis not present

## 2018-04-17 NOTE — Therapy (Signed)
[facility] [address] [place], [place], [address] Phone: [phone]   Fax:  [phone]  Physical Therapy Treatment  Patient Details  Name: [name] MRN: [id number] Date of Birth: [birth year] Referring Provider (PT): Dr. Jerl Santos   Encounter Date: 04/17/2018  PT End of Session - 04/17/18 1708    Visit Number  2    Date for PT Re-Evaluation  06/05/18    Authorization Type  UMR    PT Start Time  1445    PT Stop Time  1530    PT Time Calculation (min)  45 min    Activity Tolerance  Patient tolerated treatment well       Past Medical History:  Diagnosis Date  . A-fib (HCC)   . Anemia    hx of  . Arthritis   . Chest pain   . Constipation   . Depression   . Diabetes mellitus [facility]AL [facility]  . Diabetic retinopathy (HCC)   . Dyspnea    with minimal exertion-deconditioned  . Dyspnea   . Dysrhythmia    a-fib  . Food allergy   . GERD (gastroesophageal reflux disease)    occasional  . Gout   . History of kidney stones   . Hypercholesteremia   . Hyperlipidemia   . Hypertension   . Hypertensive kidney disease   . Hypothyroidism   . Knee pain, right   . 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 Obesity   . OSA on CPAP    uses CPAP nightly  . Osteoarthritis   . Palpitations   . Pinched nerve in neck   . Pleurisy   . Pneumonia    hx of  . PONV (postoperative nausea and vomiting)    for 3-4 days after general anesthesia  . Sciatica   . Swelling of knee joint, right   . Synovitis of knee RIGHT  . Trigger finger, left    left index  . Vitamin D deficiency     Past Surgical History:  Procedure Laterality Date  . CESAREAN SECTION  X3  . KNEE ARTHROSCOPY  04/20/2011   Procedure: ARTHROSCOPY KNEE;  Surgeon: Loanne Drilling, MD;  Location: [facility];  Service: Orthopedics;  Laterality: Right;  WITH SYNOVECTOMY  . LEFT CARPAL TUNNEL / LEFT MIDDLE & RING FINGER TRIGGER RELEASE   08-26-2008  . LEFT SHOULDER ARTHROSCOPY W/ DEBRIDEMENT  09-09-2003  . LEFT SHOULDER ARTHROSCOPY/ LEFT THUMB TRIGGER RELEASE  02-22-2005  . PHOTOCOAGULATION WITH LASER Right 03/20/2018   Procedure: PHOTOCOAGULATION WITH LASER;  Surgeon: Carmela Rima, MD;  Location: [facility];  Service: Ophthalmology;  Laterality: Right;  . PULLEY RELEASE LEFT LONG FINGER  07-14-2009  . RIGHT CARPAL TUNNEL/ RIGHT THUMB TRIGGER RELEASE'S  11-28-2006  . RIGHT SHOULDER ARTHROSCOPY W/ ROTATOR CUFF REPAIR  01-13-2004  . SHOULDER ARTHROSCOPY DISTAL CLAVICLE EXCISION AND OPEN ROTATOR CUFF REPAIR  09-07-2004   LEFT  . SHOULDER ARTHROSCOPY W/ ACROMIAL REPAIR  11-29-2005   LEFT  . TONSILLECTOMY AND ADENOIDECTOMY  child  . TOTAL KNEE ARTHROPLASTY  12-14-2009   RIGHT  . TOTAL KNEE ARTHROPLASTY Left 11/05/2012   Procedure: LEFT TOTAL KNEE ARTHROPLASTY;  Surgeon: Loanne Drilling, MD;  Location: [facility];  Service: Orthopedics;  Laterality: Left;  . TOTAL KNEE REVISION Right 07/29/2016   Procedure: RIGHT KNEE POLY-LINER EXCHANGE;  Surgeon: Kathryne Hitch, MD;  Location: [facility];  Service: Orthopedics;  Laterality: Right;  . TRIGGER FINGER  RELEASE Right 02/21/2013   Procedure: RIGHT RING A-1 PULLEY RELEASE    (MINOR PROCEDURE) ;  Surgeon: Wyn Forster., MD;  Location: [facility];  Service: Orthopedics;  Laterality: Right;  . TRIGGER FINGER RELEASE Left 09/22/2016   Procedure: RELEASE TRIGGER FINGER LEFT INDEX FINGER;  Surgeon: Kathryne Hitch, MD;  Location: [facility];  Service: Orthopedics;  Laterality: Left;  . TRIGGER FINGER RELEASE Right 01/20/2017   Procedure: RELEASE TRIGGER FINGER/A-1 PULLEY RIGHT INDEX FINGER;  Surgeon: Marcene Corning, MD;  Location: [facility];  Service: Orthopedics;  Laterality: Right;  . VITRECTOMY 25 GAUGE WITH SCLERAL BUCKLE Right 03/20/2018   Procedure: RIGHT EYE VITRECTOMY WITH  ENDOLASER PARENTAL PHOTOCOAGULATION 25 GAUGE;  Surgeon: Carmela Rima, MD;   Location[facility];  Service: Ophthalmology;  Laterality: Right;    There were no vitals filed for this visit.  Subjective Assessment - 04/17/18 1522    Currently in Pain?  No/denies    Pain Score  0-No pain    Pain Orientation  Right    Pain Type  Chronic pain                       [facility] Adult PT Treatment/Exercise - 04/17/18 0001      Modalities   Modalities  Vasopneumatic      Vasopneumatic   Number Minutes Vasopneumatic   10 minutes    Vasopnuematic Location   Ankle    Vasopneumatic Pressure  Medium    Vasopneumatic Temperature   32 degrees      Manual Therapy   Manual therapy comments  plantar fascia and gastroc stretch 3x 30 sec     Joint Mobilization  Talar mobs AP, calcaneal inversion/eversion grade 3 3x 10 each     Soft tissue mobilization  instrument assisted to peroneals, gastroc and anterior tibialis, EHL fanning sweeping with and without active movement     Kinesiotex  Edema;Facilitate Muscle      Kinesiotix   Facilitate Muscle   I strip along lateral leg and over lateral malleolus      Ankle Exercises: Supine   Isometrics  5x 5 sec holds 4 ways                PT Short Term Goals - 04/10/18 1931      PT SHORT TERM GOAL #1   Title  be independent in initial HEP    Time  4    Period  Weeks    Status  New    Target Date  05/08/18      PT SHORT TERM GOAL #2   Title  report a 25% reduction in right ankle pain  with standing and walking    Time  4    Period  Weeks    Status  On-going      PT SHORT TERM GOAL #3   Title  The patient will have improved ankle dorsiflexion to 3 degrees, inversion to 33 degrees and eversion to 20 degrees  needed for walking and climbing stairs.      Time  4    Period  Weeks    Status  New      PT SHORT TERM GOAL #4   Title  Decreased figure 8 girth measurement to 51 1/2 indicating decreased swelling    Time  4    Period  Weeks    Status  New      PT SHORT TERM GOAL #  5   Title  ...        PT  Long Term Goals - 04/10/18 1938      PT LONG TERM GOAL #1   Title  be independent in advanced HEP    Time  8    Period  Weeks    Status  New    Target Date  06/05/18      PT LONG TERM GOAL #2   Title  reduce FOTO to < or = 42% limitation    Time  8    Period  Weeks    Status  New      PT LONG TERM GOAL #3   Title  The patient will have improved ankle dorsiflexion to 5 degrees and inversion to 35 degrees, eversion to 25 degrees needed for ambulation, stairs    Time  8    Period  Weeks    Status  New      PT LONG TERM GOAL #4   Title  Right ankle strength grossly 4-/5 needed for walking and standing longer periods of time    Time  8    Period  Weeks    Status  New      PT LONG TERM GOAL #5   Title  The patient will report a 50% improvement in pain with walking 1/4 to 1/2 mile.      Time  8    Period  Weeks    Status  New            Plan - 04/17/18 1709    Clinical Impression Statement  The patient reports no change since initial visit with continued lateral ankle pain and swelling. No immediate change from ionto #1.   Initiated manual therapy to increased joint and soft tissue mobility.  Decreased edema noted following vasocompression.  Kinesiotape initiated for pain control, proprioceptive input and edema reduction.  Therapist closely monitoring response with all treatment interventions.      Rehab Potential  Good    Clinical Impairments Affecting Rehab Potential  osteopenia;  stenosis;  right TKR    PT Frequency  2x / week    PT Duration  8 weeks    PT Treatment/Interventions  ADLs/Self Care Home Management;Cryotherapy;Electrical Stimulation;Moist Heat;Iontophoresis 4mg /ml Dexamethasone;Ultrasound;Therapeutic exercise;Therapeutic activities;Neuromuscular re-education;Patient/family education;Manual techniques;Dry needling;Taping;Vasopneumatic Device    PT Next Visit Plan    vasocompression;  instrument assisted soft tissue mobilization static and dynamic and add weight  bearing;  assess response to  kinesiotaping       Patient will benefit from skilled therapeutic intervention in order to improve the following deficits and impairments:  Pain, Increased fascial restricitons, Decreased range of motion, Decreased strength, Impaired perceived functional ability, Difficulty walking  Visit Diagnosis: Pain in right ankle and joints of right foot  Stiffness of right ankle, not elsewhere classified  Muscle weakness (generalized)  Localized edema     Problem List Patient Active Problem List   Diagnosis Date Noted  . Other fatigue 07/18/2017  . Shortness of breath on exertion 07/18/2017  . Type 2 diabetes mellitus with retinopathy, with long-term current use of insulin (HCC) 07/18/2017  . Vitamin D deficiency 07/18/2017  . B12 nutritional deficiency 07/18/2017  . Other specified hypothyroidism 07/18/2017  . Acute pain of right knee 12/22/2016  . Trigger index finger of left hand 09/22/2016  . 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 Diabetes (HCC) 01/08/2014  . Essential hypertension 01/08/2014  . Hyperlipidemia 01/08/2014  . Postoperative anemia due to acute blood loss 11/22/2012  . Hyponatremia 11/06/2012  . OA (osteoarthritis) of knee 11/05/2012  . Villonodular synovitis of knee 04/20/2011   Lavinia Sharps, PT 04/17/18 5:15 PM Phone: [phone] Fax: [phone]  Vivien Presto 04/17/2018, 5:14 PM  [facility] [address] [place], [place], [address] Phone: [phone]   Fax:  [phone]  Name: [name] MRN: [id number] Date of Birth: 07-Sep-1950

## 2018-04-17 NOTE — Progress Notes (Signed)
Office: [phone]  /  Fax: [phone]   HPI:   Chief Complaint: OBESITY [name] is here to discuss her progress with her obesity treatment plan. She is on the Category 2 plan and is following her eating plan approximately 50 % of the time. She states she is exercising 0 minutes 0 times per week. [name] continues to do well with weight loss even with increased stress this month. She notes her hunger has improved especially last week.  Her weight is 252 lb (114.3 kg) today and has had a weight loss of 2 pounds over a period of 4 weeks since her last visit. She has lost 13 lbs since starting treatment with [name].  Osteopenia [name] is now on a calcium supplement. She is working on her diet and strengthening exercises.  ASSESSMENT AND PLAN:  Osteopenia of other site  Class 3 severe obesity with serious comorbidity and body mass index (BMI) of 45.0 to 49.9 in adult, unspecified obesity type (HCC)  PLAN:  Osteopenia We discussed various options for exercise and calcium rich foods with [name]. She agrees to continue vitamin D as well and will follow up in 4 to 5 weeks.   I spent > than 50% of the 25 minute visit on counseling as documented in the note.  Obesity [name] is currently in the action stage of change. As such, her goal is to continue with weight loss efforts. She has agreed to follow the Category 2 plan. [name] has been instructed to work up to a goal of 150 minutes of combined cardio and strengthening exercise per week for weight loss and overall health benefits. We discussed the following Behavioral Modification Strategies today: increasing lean protein intake, decreasing simple carbohydrates, and work on meal planning and easy cooking plans.  [name] has agreed to follow up with our clinic in 4 to 5 weeks. She was informed of the importance of frequent follow up visits to maximize her success with intensive lifestyle modifications for her multiple health  conditions.  ALLERGIES: Allergies  Allergen Reactions  . Actos [Pioglitazone Hydrochloride] Swelling and Other (See Comments)    Numbness in lips, HEADACHES SWELLING REACTION UNSPECIFIED   . Avandia [Rosiglitazone] Swelling    Numbness in lips, headaches  SWELLING REACTION UNSPECIFIED   . Tetanus Toxoids Other (See Comments)    Arm swelling, fainted, ?Respiratory distress  (horse serum)  . Food Nausea Only    Eggplant    MEDICATIONS: Current Outpatient Medications on File Prior to Visit  Medication Sig Dispense Refill  . amLODipine (NORVASC) 5 MG tablet Take 5 mg by mouth every evening.     [name]. amoxicillin (AMOXIL) 500 MG capsule Take 2,000 mg by mouth as directed. Take 2000 mg 1 hour prior to dental work  0  . atorvastatin (LIPITOR) 40 MG tablet Take 40 mg by mouth every evening.     . calcium carbonate (CALCIUM 600) 600 MG TABS tablet Take 600 mg by mouth daily with breakfast.    . cholecalciferol (VITAMIN D3) 25 MCG (1000 UT) tablet Take 1,000 Units by mouth daily.    . DULoxetine (CYMBALTA) 60 MG capsule Take 60 mg by mouth daily.    . flecainide (TAMBOCOR) 50 MG tablet TAKE 1 TABLET BY MOUTH TWICE A DAY 180 tablet 2  . hydrochlorothiazide (MICROZIDE) 12.5 MG capsule Take 12.5 mg by mouth daily.   3  . HYDROcodone-acetaminophen (NORCO/VICODIN) 5-325 MG tablet TAKE 1 TABLET BY MOUTH EVERY 6 HOURS AS NEEDED FOR MODERATE PAIN. (Patient taking  differently: Take 1 tablet by mouth every 6 (six) hours as needed for moderate pain or severe pain. ) 30 tablet 0  . indomethacin (INDOCIN) 50 MG capsule Take 50 mg by mouth 3 (three) times daily as needed (gout).    . insulin glargine (LANTUS) 100 UNIT/ML injection Inject 40 Units into the skin 2 (two) times daily.     [name]. ketorolac (TORADOL) 10 MG tablet Take 10 mg by mouth 3 (three) times daily as needed (CHEST WALL PAIN/PLURACY).    [name]. levothyroxine (SYNTHROID, LEVOTHROID) 125 MCG tablet Take 125 mcg by mouth daily before breakfast.     .  Melatonin 5 MG CAPS Take 5-10 mg by mouth at bedtime as needed (sleep).     . metFORMIN (GLUCOPHAGE-XR) 500 MG 24 hr tablet Take 1,000 mg by mouth daily with supper.    . methocarbamol (ROBAXIN) 500 MG tablet TAKE 1 TABLET BY MOUTH EVERY 6 HOURS AS NEEDED FOR MUSCLE SPASMS (Patient taking differently: Take 500 mg by mouth every 6 (six) hours as needed for muscle spasms. ) 60 tablet 0  . metoprolol tartrate (LOPRESSOR) 25 MG tablet Take 25 mg by mouth 2 (two) times daily.    [name]. omeprazole (PRILOSEC) 20 MG capsule Take 20 mg by mouth daily.     . prednisoLONE acetate (PRED FORTE) 1 % ophthalmic suspension 1 drop 4 (four) times daily.    . TRULICITY 1.5 MG/0.5ML SOPN Inject 1.5 mg as directed every Sunday.   11  . valsartan (DIOVAN) 320 MG tablet Take 320 mg by mouth daily.   3  . XARELTO 20 MG TABS tablet TAKE 1 TABLET BY MOUTH ONCE DAILY WITH SUPPER (Patient taking differently: Take 20 mg by mouth daily with supper. ) 30 tablet 9   No current facility-administered medications on file prior to visit.     PAST MEDICAL HISTORY: Past Medical History:  Diagnosis Date  . A-fib (HCC)   . Anemia    hx of  . Arthritis   . Chest pain   . Constipation   . Depression   . Diabetes mellitus ORAL MEDS  . Diabetic retinopathy (HCC)   . Dyspnea    with minimal exertion-deconditioned  . Dyspnea   . Dysrhythmia    a-fib  . Food allergy   . GERD (gastroesophageal reflux disease)    occasional  . Gout   . History of kidney stones   . Hypercholesteremia   . Hyperlipidemia   . Hypertension   . Hypertensive kidney disease   . Hypothyroidism   . Knee pain, right   . 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 Obesity   . OSA on CPAP    uses CPAP nightly  . Osteoarthritis   . Palpitations   . Pinched nerve in neck   . Pleurisy   . Pneumonia    hx of  . PONV (postoperative nausea and vomiting)    for 3-4 days after general anesthesia  . Sciatica   . Swelling of knee joint, right   .  Synovitis of knee RIGHT  . Trigger finger, left    left index  . Vitamin D deficiency     PAST SURGICAL HISTORY: Past Surgical History:  Procedure Laterality Date  . CESAREAN SECTION  X3  . KNEE ARTHROSCOPY  04/20/2011   Procedure: ARTHROSCOPY KNEE;  Surgeon: Loanne DrillingFrank V Aluisio, MD;  [facility];  Service: Orthopedics;  Laterality: Right;  WITH SYNOVECTOMY  . LEFT CARPAL  TUNNEL / LEFT MIDDLE & RING FINGER TRIGGER RELEASE  08-26-2008  . LEFT SHOULDER ARTHROSCOPY W/ DEBRIDEMENT  09-09-2003  . LEFT SHOULDER ARTHROSCOPY/ LEFT THUMB TRIGGER RELEASE  02-22-2005  . PHOTOCOAGULATION WITH LASER Right 03/20/2018   Procedure: PHOTOCOAGULATION WITH LASER;  Surgeon: Carmela Rima, MD;  Location: [facility] OR;  Service: Ophthalmology;  Laterality: Right;  . PULLEY RELEASE LEFT LONG FINGER  07-14-2009  . RIGHT CARPAL TUNNEL/ RIGHT THUMB TRIGGER RELEASE'S  11-28-2006  . RIGHT SHOULDER ARTHROSCOPY W/ ROTATOR CUFF REPAIR  01-13-2004  . SHOULDER ARTHROSCOPY DISTAL CLAVICLE EXCISION AND OPEN ROTATOR CUFF REPAIR  09-07-2004   LEFT  . SHOULDER ARTHROSCOPY W/ ACROMIAL REPAIR  11-29-2005   LEFT  . TONSILLECTOMY AND ADENOIDECTOMY  child  . TOTAL KNEE ARTHROPLASTY  12-14-2009   RIGHT  . TOTAL KNEE ARTHROPLASTY Left 11/05/2012   Procedure: LEFT TOTAL KNEE ARTHROPLASTY;  Surgeon: Loanne Drilling, MD;  Location: [facility];  Service: Orthopedics;  Laterality: Left;  . TOTAL KNEE REVISION Right 07/29/2016   Procedure: RIGHT KNEE POLY-LINER EXCHANGE;  Surgeon: Kathryne Hitch, MD;  Location: [facility];  Service: Orthopedics;  Laterality: Right;  . TRIGGER FINGER RELEASE Right 02/21/2013   Procedure: RIGHT RING A-1 PULLEY RELEASE    (MINOR PROCEDURE) ;  Surgeon: Wyn Forster., MD;  [facility];  Service: Orthopedics;  Laterality: Right;  . TRIGGER FINGER RELEASE Left 09/22/2016   Procedure: RELEASE TRIGGER FINGER LEFT INDEX FINGER;  Surgeon: Kathryne Hitch, MD;   Location: [facility];  Service: Orthopedics;  Laterality: Left;  . TRIGGER FINGER RELEASE Right 01/20/2017   Procedure: RELEASE TRIGGER FINGER/A-1 PULLEY RIGHT INDEX FINGER;  Surgeon: Marcene Corning, MD;  Location: [facility];  Service: Orthopedics;  Laterality: Right;  . VITRECTOMY 25 GAUGE WITH SCLERAL BUCKLE Right 03/20/2018   Procedure: RIGHT EYE VITRECTOMY WITH  ENDOLASER PARENTAL PHOTOCOAGULATION 25 GAUGE;  Surgeon: Carmela Rima, MD;  Location: [facility];  Service: Ophthalmology;  Laterality: Right;    SOCIAL HISTORY: Social History   Tobacco Use  . Smoking status: Former Smoker    Years: 5.00    Types: Cigarettes    Last attempt to quit: 04/12/1977    Years since quitting: 41.0  . Smokeless tobacco: Never Used  Substance Use Topics  . Alcohol use: No  . Drug use: No    FAMILY HISTORY: Family History  Problem Relation Age of Onset  . Diabetes Mother   . Hypertension Mother   . Heart attack Mother   . Thyroid disease Mother   . Obesity Mother   . Cancer Father   . Liver disease Father   . Sleep apnea Father   . Alcoholism Father     ROS: Review of Systems  Constitutional: Positive for weight loss.   PHYSICAL EXAM: Blood pressure 116/68, pulse 63, height 5\' 1"  (1.549 m), weight 252 lb (114.3 kg), SpO2 92 %. Body mass index is 47.61 kg/m. Physical Exam Vitals signs reviewed.  Constitutional:      Appearance: Normal appearance. She is obese.  Cardiovascular:     Rate and Rhythm: Normal rate.  Pulmonary:     Effort: Pulmonary effort is normal.  Musculoskeletal: Normal range of motion.  Skin:    General: Skin is warm and dry.  Neurological:     Mental Status: She is alert and oriented to person, place, and time.  Psychiatric:        Mood and Affect: Mood normal.  Behavior: Behavior normal.    RECENT LABS AND TESTS: BMET    Component Value Date/Time   NA 138 03/20/2018 1331   NA 142 11/28/2017 1121   K 4.0 03/20/2018 1331   CL 104  03/20/2018 1331   CO2 25 03/20/2018 1331   GLUCOSE 76 03/20/2018 1331   BUN 22 03/20/2018 1331   BUN 25 11/28/2017 1121   CREATININE 1.20 (H) 03/20/2018 1331   CALCIUM 9.9 03/20/2018 1331   GFRNONAA 46 (L) 03/20/2018 1331   GFRAA 53 (L) 03/20/2018 1331   Lab Results  Component Value Date   HGBA1C 7.4 (H) 11/28/2017   HGBA1C 6.8 (H) 07/18/2017   HGBA1C 7.7 (H) 07/20/2016   HGBA1C 7.9 09/18/2014   No results found for: INSULIN CBC    Component Value Date/Time   WBC 11.7 (H) 03/20/2018 1331   RBC 4.75 03/20/2018 1331   HGB 13.5 03/20/2018 1331   HGB 13.0 07/18/2017 1112   HCT 44.0 03/20/2018 1331   HCT 39.6 07/18/2017 1112   PLT 332 03/20/2018 1331   MCV 92.6 03/20/2018 1331   MCV 87 07/18/2017 1112   MCH 28.4 03/20/2018 1331   MCHC 30.7 03/20/2018 1331   RDW 14.6 03/20/2018 1331   RDW 15.3 07/18/2017 1112   LYMPHSABS 1.8 07/18/2017 1112   EOSABS 0.1 07/18/2017 1112   BASOSABS 0.0 07/18/2017 1112   Iron/TIBC/Ferritin/ %Sat No results found for: IRON, TIBC, FERRITIN, IRONPCTSAT Lipid Panel     Component Value Date/Time   CHOL 126 07/18/2017 1112   TRIG 98 07/18/2017 1112   HDL 43 07/18/2017 1112   LDLCALC 63 07/18/2017 1112   Hepatic Function Panel     Component Value Date/Time   PROT 6.5 11/28/2017 1121   ALBUMIN 3.7 11/28/2017 1121   AST 25 11/28/2017 1121   ALT 24 11/28/2017 1121   ALKPHOS 74 11/28/2017 1121   BILITOT 0.3 11/28/2017 1121      Component Value Date/Time   TSH 3.740 07/18/2017 1112   Results for ZAFIRO, ROUTSON (MRN [id number]) as of 04/17/2018 09:27  Ref. Range 11/28/2017 11:21  Vitamin D, 25-Hydroxy Latest Ref Range: 30.0 - 100.0 ng/mL 31.4   OBESITY BEHAVIORAL INTERVENTION VISIT  Today's visit was # 13   Starting weight: 265 lbs Starting date: 07/18/17 Today's weight : Weight: 252 lb (114.3 kg)  Today's date: 04/16/2018 Total lbs lost to date: 13  ASK: We discussed the diagnosis of obesity with [name] today and [name] agreed  to give [name] permission to discuss obesity behavioral modification therapy today.  ASSESS: Chakara has the diagnosis of obesity and her BMI today is 47.6. Karey is in the action stage of change.   ADVISE: Natash was educated on the multiple health risks of obesity as well as the benefit of weight loss to improve her health. She was advised of the need for long term treatment and the importance of lifestyle modifications to improve her current health and to decrease her risk of future health problems.  AGREE: Multiple dietary modification options and treatment options were discussed and Thomasenia agreed to follow the recommendations documented in the above note.  ARRANGE: Fayelynn was educated on the importance of frequent visits to treat obesity as outlined per CMS and USPSTF guidelines and agreed to schedule her next follow up appointment today.  IKirke Corin, CMA, am acting as transcriptionist for Wilder Glade, MD  I have reviewed the above documentation for accuracy and completeness, and I agree with the  above. -Dennard Nip, MD

## 2018-04-19 ENCOUNTER — Ambulatory Visit: Payer: 59 | Admitting: Physical Therapy

## 2018-04-19 ENCOUNTER — Encounter: Payer: Self-pay | Admitting: Physical Therapy

## 2018-04-19 DIAGNOSIS — M25571 Pain in right ankle and joints of right foot: Secondary | ICD-10-CM

## 2018-04-19 DIAGNOSIS — M25671 Stiffness of right ankle, not elsewhere classified: Secondary | ICD-10-CM | POA: Diagnosis not present

## 2018-04-19 DIAGNOSIS — M6281 Muscle weakness (generalized): Secondary | ICD-10-CM | POA: Diagnosis not present

## 2018-04-19 DIAGNOSIS — R600 Localized edema: Secondary | ICD-10-CM

## 2018-04-19 NOTE — Therapy (Signed)
[facility] [address] [place], [place], [address] Phone: [phone]   Fax:  [phone]  Physical Therapy Treatment  Patient Details  Name: [name] MRN: [id number] Date of Birth: [birth year] Referring Provider (PT): Dr. Jerl Santos   Encounter Date: 04/19/2018  PT End of Session - 04/19/18 1538    Visit Number  3    Date for PT Re-Evaluation  06/05/18    Authorization Type  [facility]    PT Start Time  1445    PT Stop Time  1535    PT Time Calculation (min)  50 min    Activity Tolerance  Patient tolerated treatment well       Past Medical History:  Diagnosis Date  . A-fib (HCC)   . Anemia    hx of  . Arthritis   . Chest pain   . Constipation   . Depression   . Diabetes mellitus ORAL MEDS  . Diabetic retinopathy (HCC)   . Dyspnea    with minimal exertion-deconditioned  . Dyspnea   . Dysrhythmia    a-fib  . Food allergy   . GERD (gastroesophageal reflux disease)    occasional  . Gout   . History of kidney stones   . Hypercholesteremia   . Hyperlipidemia   . Hypertension   . Hypertensive kidney disease   . Hypothyroidism   . Knee pain, right   . 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 Obesity   . OSA on CPAP    uses CPAP nightly  . Osteoarthritis   . Palpitations   . Pinched nerve in neck   . Pleurisy   . Pneumonia    hx of  . PONV (postoperative nausea and vomiting)    for 3-4 days after general anesthesia  . Sciatica   . Swelling of knee joint, right   . Synovitis of knee RIGHT  . Trigger finger, left    left index  . Vitamin D deficiency     Past Surgical History:  Procedure Laterality Date  . CESAREAN SECTION  X3  . KNEE ARTHROSCOPY  04/20/2011   Procedure: ARTHROSCOPY KNEE;  Surgeon: Loanne Drilling, MD;  Location: [facility];  Service: Orthopedics;  Laterality: Right;  WITH SYNOVECTOMY  . LEFT CARPAL TUNNEL / LEFT MIDDLE & RING FINGER TRIGGER RELEASE   08-26-2008  . LEFT SHOULDER ARTHROSCOPY W/ DEBRIDEMENT  09-09-2003  . LEFT SHOULDER ARTHROSCOPY/ LEFT THUMB TRIGGER RELEASE  02-22-2005  . PHOTOCOAGULATION WITH LASER Right 03/20/2018   Procedure: PHOTOCOAGULATION WITH LASER;  Surgeon: Carmela Rima, MD;  Location: [facility] OR;  Service: Ophthalmology;  Laterality: Right;  . PULLEY RELEASE LEFT LONG FINGER  07-14-2009  . RIGHT CARPAL TUNNEL/ RIGHT THUMB TRIGGER RELEASE'S  11-28-2006  . RIGHT SHOULDER ARTHROSCOPY W/ ROTATOR CUFF REPAIR  01-13-2004  . SHOULDER ARTHROSCOPY DISTAL CLAVICLE EXCISION AND OPEN ROTATOR CUFF REPAIR  09-07-2004   LEFT  . SHOULDER ARTHROSCOPY W/ ACROMIAL REPAIR  11-29-2005   LEFT  . TONSILLECTOMY AND ADENOIDECTOMY  child  . TOTAL KNEE ARTHROPLASTY  12-14-2009   RIGHT  . TOTAL KNEE ARTHROPLASTY Left 11/05/2012   Procedure: LEFT TOTAL KNEE ARTHROPLASTY;  Surgeon: Loanne Drilling, MD;  Location: [facility];  Service: Orthopedics;  Laterality: Left;  . TOTAL KNEE REVISION Right 07/29/2016   Procedure: RIGHT KNEE POLY-LINER EXCHANGE;  Surgeon: Kathryne Hitch, MD;  Location: [facility];  Service: Orthopedics;  Laterality: Right;  . TRIGGER FINGER  RELEASE Right 02/21/2013   Procedure: RIGHT RING A-1 PULLEY RELEASE    (MINOR PROCEDURE) ;  Surgeon: Wyn Forster., MD;  Location: [facility];  Service: Orthopedics;  Laterality: Right;  . TRIGGER FINGER RELEASE Left 09/22/2016   Procedure: RELEASE TRIGGER FINGER LEFT INDEX FINGER;  Surgeon: Kathryne Hitch, MD;  Location: [facility];  Service: Orthopedics;  Laterality: Left;  . TRIGGER FINGER RELEASE Right 01/20/2017   Procedure: RELEASE TRIGGER FINGER/A-1 PULLEY RIGHT INDEX FINGER;  Surgeon: Marcene Corning, MD;  Location: [facility];  Service: Orthopedics;  Laterality: Right;  . VITRECTOMY 25 GAUGE WITH SCLERAL BUCKLE Right 03/20/2018   Procedure: RIGHT EYE VITRECTOMY WITH  ENDOLASER PARENTAL PHOTOCOAGULATION 25 GAUGE;  Surgeon: Carmela Rima, MD;   Location: [facility] OR;  Service: Ophthalmology;  Laterality: Right;    There were no vitals filed for this visit.  Subjective Assessment - 04/19/18 1453    Subjective  Doing OK.  I think what we did last time helped.  I like the tape.      Pertinent History  Eye surgery 3 weeks ago vitreal hemorrhage going to have injection tomorrow, no jumping/running, no pool;  steroid drops in eyes;  recent bone density shows moderate osteopenia; lost 27#;  low back problems waiting to have ESI for spinal stenosis;  right TKR some pain/weakness    Currently in Pain?  Yes    Pain Score  3     Pain Location  Ankle    Pain Type  Chronic pain                       [facility] Adult PT Treatment/Exercise - 04/19/18 0001      Vasopneumatic   Number Minutes Vasopneumatic   10 minutes    Vasopnuematic Location   Ankle    Vasopneumatic Pressure  Medium    Vasopneumatic Temperature   32 degrees      Manual Therapy   Manual therapy comments  plantar fascia and gastroc stretch 3x 30 sec     Soft tissue mobilization  instrument assisted to peroneals, gastroc and anterior tibialis, EHL fanning sweeping with and without active movement     Kinesiotex  Edema;Facilitate Muscle      Ankle Exercises: Seated   Other Seated Ankle Exercises  rocker board PF/DF       Ankle Exercises: Supine   Isometrics  5x 5 sec holds 4 ways     Other Supine Ankle Exercises  resisted toe curls 10x     Other Supine Ankle Exercises  active great toe abduction 10x       Ankle Exercises: Aerobic   Nustep  10 min L1                PT Short Term Goals - 04/10/18 1931      PT SHORT TERM GOAL #1   Title  be independent in initial HEP    Time  4    Period  Weeks    Status  New    Target Date  05/08/18      PT SHORT TERM GOAL #2   Title  report a 25% reduction in right ankle pain  with standing and walking    Time  4    Period  Weeks    Status  On-going      PT SHORT TERM GOAL #3   Title  The patient will have  improved ankle dorsiflexion to 3  degrees, inversion to 33 degrees and eversion to 20 degrees  needed for walking and climbing stairs.      Time  4    Period  Weeks    Status  New      PT SHORT TERM GOAL #4   Title  Decreased figure 8 girth measurement to 51 1/2 indicating decreased swelling    Time  4    Period  Weeks    Status  New      PT SHORT TERM GOAL #5   Title  ...        PT Long Term Goals - 04/10/18 1938      PT LONG TERM GOAL #1   Title  be independent in advanced HEP    Time  8    Period  Weeks    Status  New    Target Date  06/05/18      PT LONG TERM GOAL #2   Title  reduce FOTO to < or = 42% limitation    Time  8    Period  Weeks    Status  New      PT LONG TERM GOAL #3   Title  The patient will have improved ankle dorsiflexion to 5 degrees and inversion to 35 degrees, eversion to 25 degrees needed for ambulation, stairs    Time  8    Period  Weeks    Status  New      PT LONG TERM GOAL #4   Title  Right ankle strength grossly 4-/5 needed for walking and standing longer periods of time    Time  8    Period  Weeks    Status  New      PT LONG TERM GOAL #5   Title  The patient will report a 50% improvement in pain with walking 1/4 to 1/2 mile.      Time  8    Period  Weeks    Status  New            Plan - 04/19/18 1538    Clinical Impression Statement  The patient had a good initial response to manual therapy including myofascial work with instrument assisted as well as kinesiotaping.  She is able to do more dynamic/active exercise today without exacerbation of pain.   Decreased edema following treatment session.  Therapist closely monitoring response with all treatment interventions.      Rehab Potential  Good    Clinical Impairments Affecting Rehab Potential  osteopenia;  stenosis;  right TKR    PT Frequency  2x / week    PT Duration  8 weeks    PT Treatment/Interventions  ADLs/Self Care Home Management;Cryotherapy;Electrical Stimulation;Moist  Heat;Iontophoresis 4mg /ml Dexamethasone;Ultrasound;Therapeutic exercise;Therapeutic activities;Neuromuscular re-education;Patient/family education;Manual techniques;Dry needling;Taping;Vasopneumatic Device    PT Next Visit Plan    vasocompression;  instrument assisted soft tissue mobilization static and dynamic and add weight bearing;  assess response to  kinesiotaping       Patient will benefit from skilled therapeutic intervention in order to improve the following deficits and impairments:  Pain, Increased fascial restricitons, Decreased range of motion, Decreased strength, Impaired perceived functional ability, Difficulty walking  Visit Diagnosis: Pain in right ankle and joints of right foot  Stiffness of right ankle, not elsewhere classified  Muscle weakness (generalized)  Localized edema     Problem List Patient Active Problem List   Diagnosis Date Noted  . Other fatigue 07/18/2017  . Shortness of breath on  exertion 07/18/2017  . Type 2 diabetes mellitus with retinopathy, with long-term current use of insulin (HCC) 07/18/2017  . Vitamin D deficiency 07/18/2017  . B12 nutritional deficiency 07/18/2017  . Other specified hypothyroidism 07/18/2017  . Acute pain of right knee 12/22/2016  . Trigger index finger of left hand 09/22/2016  . 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 Diabetes (HCC) 01/08/2014  . Essential hypertension 01/08/2014  . Hyperlipidemia 01/08/2014  . Postoperative anemia due to acute blood loss 11/22/2012  . Hyponatremia 11/06/2012  . OA (osteoarthritis) of knee 11/05/2012  . Villonodular synovitis of knee 04/20/2011   Lavinia SharpsStacy Annisa Mazzarella, PT 04/19/18 3:43 PM Phone: [phone] Fax: [phone] Vivien PrestoSimpson, Meily Glowacki C 04/19/2018, 3:43 PM  [facility] [address] [place], [place], [address] Phone: [phone]   Fax:  [phone]  Name: Alinda DeemBonnie D Rossbach MRN: [id number] Date of Birth: 1950-12-21

## 2018-04-24 ENCOUNTER — Ambulatory Visit: Payer: 59 | Admitting: Physical Therapy

## 2018-04-24 ENCOUNTER — Encounter: Payer: Self-pay | Admitting: Physical Therapy

## 2018-04-24 DIAGNOSIS — M6281 Muscle weakness (generalized): Secondary | ICD-10-CM

## 2018-04-24 DIAGNOSIS — R600 Localized edema: Secondary | ICD-10-CM | POA: Diagnosis not present

## 2018-04-24 DIAGNOSIS — M25671 Stiffness of right ankle, not elsewhere classified: Secondary | ICD-10-CM

## 2018-04-24 DIAGNOSIS — M25571 Pain in right ankle and joints of right foot: Secondary | ICD-10-CM | POA: Diagnosis not present

## 2018-04-24 NOTE — Therapy (Signed)
[facility] [address], [address] [place], [place], [address] Phone: [phone]   Fax:  [phone]  Physical Therapy Treatment  Patient Details  Name: [name] MRN: [id number] Date of Birth: 12-01-1950 Referring Provider (PT): Dr. Jerl Santos   Encounter Date: 04/24/2018  PT End of Session - 04/24/18 1611    Visit Number  4    Date for PT Re-Evaluation  06/05/18    Authorization Type  UMR    PT Start Time  1533    PT Stop Time  1615    PT Time Calculation (min)  42 min    Activity Tolerance  Patient tolerated treatment well       Past Medical History:  Diagnosis Date  . A-fib (HCC)   . Anemia    hx of  . Arthritis   . Chest pain   . Constipation   . Depression   . Diabetes mellitus [facility]  . Diabetic retinopathy (HCC)   . Dyspnea    with minimal exertion-deconditioned  . Dyspnea   . Dysrhythmia    a-fib  . Food allergy   . GERD (gastroesophageal reflux disease)    occasional  . Gout   . History of kidney stones   . Hypercholesteremia   . Hyperlipidemia   . Hypertension   . Hypertensive kidney disease   . Hypothyroidism   . Knee pain, right   . 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 Obesity   . OSA on CPAP    uses CPAP nightly  . Osteoarthritis   . Palpitations   . Pinched nerve in neck   . Pleurisy   . Pneumonia    hx of  . PONV (postoperative nausea and vomiting)    for 3-4 days after general anesthesia  . Sciatica   . Swelling of knee joint, right   . Synovitis of knee RIGHT  . Trigger finger, left    left index  . Vitamin D deficiency     Past Surgical History:  Procedure Laterality Date  . CESAREAN SECTION  X3  . KNEE ARTHROSCOPY  04/20/2011   Procedure: ARTHROSCOPY KNEE;  Surgeon: Loanne Drilling, MD;  Location: [facility];  Service: Orthopedics;  Laterality: Right;  WITH SYNOVECTOMY  . LEFT CARPAL TUNNEL / LEFT MIDDLE & RING FINGER TRIGGER RELEASE   08-26-2008  . LEFT SHOULDER ARTHROSCOPY W/ DEBRIDEMENT  09-09-2003  . LEFT SHOULDER ARTHROSCOPY/ LEFT THUMB TRIGGER RELEASE  02-22-2005  . PHOTOCOAGULATION WITH LASER Right 03/20/2018   Procedure: PHOTOCOAGULATION WITH LASER;  Surgeon: Carmela Rima, MD;  Location: [facility] OR;  Service: Ophthalmology;  Laterality: Right;  . PULLEY RELEASE LEFT LONG FINGER  07-14-2009  . RIGHT CARPAL TUNNEL/ RIGHT THUMB TRIGGER RELEASE'S  11-28-2006  . RIGHT SHOULDER ARTHROSCOPY W/ ROTATOR CUFF REPAIR  01-13-2004  . SHOULDER ARTHROSCOPY DISTAL CLAVICLE EXCISION AND OPEN ROTATOR CUFF REPAIR  09-07-2004   LEFT  . SHOULDER ARTHROSCOPY W/ ACROMIAL REPAIR  11-29-2005   LEFT  . TONSILLECTOMY AND ADENOIDECTOMY  child  . TOTAL KNEE ARTHROPLASTY  12-14-2009   RIGHT  . TOTAL KNEE ARTHROPLASTY Left 11/05/2012   Procedure: LEFT TOTAL KNEE ARTHROPLASTY;  Surgeon: Loanne Drilling, MD;  Location: [facility];  Service: Orthopedics;  Laterality: Left;  . TOTAL KNEE REVISION Right 07/29/2016   Procedure: RIGHT KNEE POLY-LINER EXCHANGE;  Surgeon: Kathryne Hitch, MD;  Location: [facility];  Service: Orthopedics;  Laterality: Right;  . TRIGGER FINGER  RELEASE Right 02/21/2013   Procedure: RIGHT RING A-1 PULLEY RELEASE    (MINOR PROCEDURE) ;  Surgeon: Wyn Forster., MD;  Location: [facility];  Service: Orthopedics;  Laterality: Right;  . TRIGGER FINGER RELEASE Left 09/22/2016   Procedure: RELEASE TRIGGER FINGER LEFT INDEX FINGER;  Surgeon: Kathryne Hitch, MD;  Location: [facility];  Service: Orthopedics;  Laterality: Left;  . TRIGGER FINGER RELEASE Right 01/20/2017   Procedure: RELEASE TRIGGER FINGER/A-1 PULLEY RIGHT INDEX FINGER;  Surgeon: Marcene Corning, MD;  Location: [facility];  Service: Orthopedics;  Laterality: Right;  . VITRECTOMY 25 GAUGE WITH SCLERAL BUCKLE Right 03/20/2018   Procedure: RIGHT EYE VITRECTOMY WITH  ENDOLASER PARENTAL PHOTOCOAGULATION 25 GAUGE;  Surgeon: Carmela Rima, MD;   Location: [facility] OR;  Service: Ophthalmology;  Laterality: Right;    There were no vitals filed for this visit.  Subjective Assessment - 04/24/18 1536    Subjective  I think the tape and manual therapy are helpful.    (Pended)     Currently in Pain?  Yes  (Pended)     Pain Score  3   (Pended)     Pain Location  Ankle  (Pended)     Pain Orientation  Right  (Pended)                        [facility] Adult PT Treatment/Exercise - 04/24/18 0001      Knee/Hip Exercises: Standing   Other Standing Knee Exercises  retro stepping 20x       Vasopneumatic   Number Minutes Vasopneumatic   10 minutes    Vasopnuematic Location   Ankle    Vasopneumatic Pressure  Medium    Vasopneumatic Temperature   32 degrees      Manual Therapy   Soft tissue mobilization  instrument assisted to peroneals, gastroc and anterior tibialis, EHL fanning sweeping with and without active movement with partial and full weight bearing     Kinesiotex  Edema;Facilitate Muscle      Kinesiotix   Facilitate Muscle   I strip along lateral leg and over lateral malleolus      Ankle Exercises: Seated   Other Seated Ankle Exercises  isometrics 4 ways 10x 5 sec holds    Other Seated Ankle Exercises  towel scrunches                PT Short Term Goals - 04/10/18 1931      PT SHORT TERM GOAL #1   Title  be independent in initial HEP    Time  4    Period  Weeks    Status  New    Target Date  05/08/18      PT SHORT TERM GOAL #2   Title  report a 25% reduction in right ankle pain  with standing and walking    Time  4    Period  Weeks    Status  On-going      PT SHORT TERM GOAL #3   Title  The patient will have improved ankle dorsiflexion to 3 degrees, inversion to 33 degrees and eversion to 20 degrees  needed for walking and climbing stairs.      Time  4    Period  Weeks    Status  New      PT SHORT TERM GOAL #4   Title  Decreased figure 8 girth measurement to 51 1/2 indicating decreased swelling  Time  4    Period  Weeks    Status  New      PT SHORT TERM GOAL #5   Title  ...        PT Long Term Goals - 04/10/18 1938      PT LONG TERM GOAL #1   Title  be independent in advanced HEP    Time  8    Period  Weeks    Status  New    Target Date  06/05/18      PT LONG TERM GOAL #2   Title  reduce FOTO to < or = 42% limitation    Time  8    Period  Weeks    Status  New      PT LONG TERM GOAL #3   Title  The patient will have improved ankle dorsiflexion to 5 degrees and inversion to 35 degrees, eversion to 25 degrees needed for ambulation, stairs    Time  8    Period  Weeks    Status  New      PT LONG TERM GOAL #4   Title  Right ankle strength grossly 4-/5 needed for walking and standing longer periods of time    Time  8    Period  Weeks    Status  New      PT LONG TERM GOAL #5   Title  The patient will report a 50% improvement in pain with walking 1/4 to 1/2 mile.      Time  8    Period  Weeks    Status  New            Plan - 04/24/18 2054    Clinical Impression Statement  The patient reports swelling is more intermittent now.  Will decrease temporarily but return with dependent positions.  She is able to tolerate instrument assisted myofascial release in more dynamic and with weight bearing positions.  Therapist closely monitoring response with all interventions.      Rehab Potential  Good    Clinical Impairments Affecting Rehab Potential  osteopenia;  stenosis;  right TKR    PT Frequency  2x / week    PT Duration  8 weeks    PT Treatment/Interventions  ADLs/Self Care Home Management;Cryotherapy;Electrical Stimulation;Moist Heat;Iontophoresis 4mg /ml Dexamethasone;Ultrasound;Therapeutic exercise;Therapeutic activities;Neuromuscular re-education;Patient/family education;Manual techniques;Dry needling;Taping;Vasopneumatic Device    PT Next Visit Plan    vasocompression;  instrument assisted soft tissue mobilization static and dynamic and weight bearing;    kinesiotaping       Patient will benefit from skilled therapeutic intervention in order to improve the following deficits and impairments:  Pain, Increased fascial restricitons, Decreased range of motion, Decreased strength, Impaired perceived functional ability, Difficulty walking  Visit Diagnosis: Pain in right ankle and joints of right foot  Stiffness of right ankle, not elsewhere classified  Muscle weakness (generalized)  Localized edema     Problem List Patient Active Problem List   Diagnosis Date Noted  . Other fatigue 07/18/2017  . Shortness of breath on exertion 07/18/2017  . Type 2 diabetes mellitus with retinopathy, with long-term current use of insulin (HCC) 07/18/2017  . Vitamin D deficiency 07/18/2017  . B12 nutritional deficiency 07/18/2017  . Other specified hypothyroidism 07/18/2017  . Acute pain of right knee 12/22/2016  . Trigger index finger of left hand 09/22/2016  . 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 Diabetes (HCC) 01/08/2014  . Essential hypertension 01/08/2014  . Hyperlipidemia 01/08/2014  . Postoperative anemia due to acute blood loss 11/22/2012  . Hyponatremia 11/06/2012  . OA (osteoarthritis) of knee 11/05/2012  . Villonodular synovitis of knee 04/20/2011   Lavinia SharpsStacy Chandra Asher, PT 04/24/18 9:01 PM Phone: [phone] Fax: [phone] Vivien PrestoSimpson, Jeanie Mccard C 04/24/2018, 9:00 PM  [facility] [address], [address] [place], [place], [address] Phone: [phone]   Fax:  [phone]  Name: [name] MRN: [id number] Date of Birth: Sep 16, 1950

## 2018-04-25 ENCOUNTER — Other Ambulatory Visit (INDEPENDENT_AMBULATORY_CARE_PROVIDER_SITE_OTHER): Payer: Self-pay | Admitting: Orthopaedic Surgery

## 2018-04-25 ENCOUNTER — Other Ambulatory Visit: Payer: Self-pay | Admitting: Cardiovascular Disease

## 2018-04-25 MED FILL — XARELTO 20 MG TABLET: 20 | 30 days supply | Qty: 30 | Fill #6

## 2018-04-25 MED FILL — DULOXETINE HCL 60 MG CPEP: 60 | 30 days supply | Qty: 30 | Fill #8

## 2018-04-25 MED FILL — metFORMIN HCL ER 500 MG TB2: 500 | 90 days supply | Qty: 180 | Fill #2

## 2018-04-25 MED FILL — METHOCARBAMOL 500 MG TABLET: 500 | 15 days supply | Qty: 60 | Fill #0

## 2018-04-25 MED FILL — LEVOTHYROXINE 125 MCG TABLE: 125 | 30 days supply | Qty: 30 | Fill #0

## 2018-04-25 MED FILL — FLECAINIDE ACETATE 50 MG TA: 50 | 90 days supply | Qty: 180 | Fill #0

## 2018-04-25 NOTE — Telephone Encounter (Signed)
Please advise 

## 2018-04-26 ENCOUNTER — Ambulatory Visit: Payer: 59 | Admitting: Physical Therapy

## 2018-04-26 ENCOUNTER — Encounter: Payer: Self-pay | Admitting: Physical Therapy

## 2018-04-26 DIAGNOSIS — M6281 Muscle weakness (generalized): Secondary | ICD-10-CM | POA: Diagnosis not present

## 2018-04-26 DIAGNOSIS — R600 Localized edema: Secondary | ICD-10-CM | POA: Diagnosis not present

## 2018-04-26 DIAGNOSIS — M25571 Pain in right ankle and joints of right foot: Secondary | ICD-10-CM

## 2018-04-26 DIAGNOSIS — M25671 Stiffness of right ankle, not elsewhere classified: Secondary | ICD-10-CM | POA: Diagnosis not present

## 2018-04-26 NOTE — Therapy (Signed)
[facility] [address], [address] [place], [place], [address] Phone: [phone]   Fax:  [phone]  Physical Therapy Treatment  Patient Details  Name: Stefanie DeemBonnie D Godsey MRN: [id number] Date of Birth: 1951/01/02 Referring Provider (PT): Dr. Jerl Santosalldorf   Encounter Date: 04/26/2018  PT End of Session - 04/26/18 2047    Visit Number  5    Date for PT Re-Evaluation  06/05/18    Authorization Type  UMR    PT Start Time  1450    PT Stop Time  1540    PT Time Calculation (min)  50 min    Activity Tolerance  Patient tolerated treatment well       Past Medical History:  Diagnosis Date  . A-fib (HCC)   . Anemia    hx of  . Arthritis   . Chest pain   . Constipation   . Depression   . Diabetes mellitus [facility]  . Diabetic retinopathy (HCC)   . Dyspnea    with minimal exertion-deconditioned  . Dyspnea   . Dysrhythmia    a-fib  . Food allergy   . GERD (gastroesophageal reflux disease)    occasional  . Gout   . History of kidney stones   . Hypercholesteremia   . Hyperlipidemia   . Hypertension   . Hypertensive kidney disease   . Hypothyroidism   . Knee pain, right   . 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 Obesity   . OSA on CPAP    uses CPAP nightly  . Osteoarthritis   . Palpitations   . Pinched nerve in neck   . Pleurisy   . Pneumonia    hx of  . PONV (postoperative nausea and vomiting)    for 3-4 days after general anesthesia  . Sciatica   . Swelling of knee joint, right   . Synovitis of knee RIGHT  . Trigger finger, left    left index  . Vitamin D deficiency     Past Surgical History:  Procedure Laterality Date  . CESAREAN SECTION  X3  . KNEE ARTHROSCOPY  04/20/2011   Procedure: ARTHROSCOPY KNEE;  Surgeon: Loanne DrillingFrank V Aluisio, MD;  [facility];  Service: Orthopedics;  Laterality: Right;  WITH SYNOVECTOMY  . LEFT CARPAL TUNNEL / LEFT MIDDLE & RING FINGER TRIGGER RELEASE   08-26-2008  . LEFT SHOULDER ARTHROSCOPY W/ DEBRIDEMENT  09-09-2003  . LEFT SHOULDER ARTHROSCOPY/ LEFT THUMB TRIGGER RELEASE  02-22-2005  . PHOTOCOAGULATION WITH LASER Right 03/20/2018   Procedure: PHOTOCOAGULATION WITH LASER;  Surgeon: Carmela RimaPatel, Narendra, MD;  [facility];  Service: Ophthalmology;  Laterality: Right;  . PULLEY RELEASE LEFT LONG FINGER  07-14-2009  . RIGHT CARPAL TUNNEL/ RIGHT THUMB TRIGGER RELEASE'S  11-28-2006  . RIGHT SHOULDER ARTHROSCOPY W/ ROTATOR CUFF REPAIR  01-13-2004  . SHOULDER ARTHROSCOPY DISTAL CLAVICLE EXCISION AND OPEN ROTATOR CUFF REPAIR  09-07-2004   LEFT  . SHOULDER ARTHROSCOPY W/ ACROMIAL REPAIR  11-29-2005   LEFT  . TONSILLECTOMY AND ADENOIDECTOMY  child  . TOTAL KNEE ARTHROPLASTY  12-14-2009   RIGHT  . TOTAL KNEE ARTHROPLASTY Left 11/05/2012   Procedure: LEFT TOTAL KNEE ARTHROPLASTY;  Surgeon: Loanne DrillingFrank V Aluisio, MD;  Location: [facility];  Service: Orthopedics;  Laterality: Left;  . TOTAL KNEE REVISION Right 07/29/2016   Procedure: RIGHT KNEE POLY-LINER EXCHANGE;  Surgeon: Kathryne HitchBlackman, Christopher Y, MD;  Location: [facility];  Service: Orthopedics;  Laterality: Right;  . TRIGGER FINGER  RELEASE Right 02/21/2013   Procedure: RIGHT RING A-1 PULLEY RELEASE    (MINOR PROCEDURE) ;  Surgeon: Wyn Forster., MD;  Location: [facility];  Service: Orthopedics;  Laterality: Right;  . TRIGGER FINGER RELEASE Left 09/22/2016   Procedure: RELEASE TRIGGER FINGER LEFT INDEX FINGER;  Surgeon: Kathryne Hitch, MD;  Location[facility];  Service: Orthopedics;  Laterality: Left;  . TRIGGER FINGER RELEASE Right 01/20/2017   Procedure: RELEASE TRIGGER FINGER/A-1 PULLEY RIGHT INDEX FINGER;  Surgeon: Marcene Corning, MD;  Location: [facility];  Service: Orthopedics;  Laterality: Right;  . VITRECTOMY 25 GAUGE WITH SCLERAL BUCKLE Right 03/20/2018   Procedure: RIGHT EYE VITRECTOMY WITH  ENDOLASER PARENTAL PHOTOCOAGULATION 25 GAUGE;  Surgeon: Carmela Rima, MD;   [facility];  Service: Ophthalmology;  Laterality: Right;    There were no vitals filed for this visit.  Subjective Assessment - 04/26/18 1449    Subjective  My foot has been hurting so much today.  It feels like there is a stick stuck in there.  It hurts on  the side and top of my foot.  I've been up on my foot more today.  Inversion is the most painful motion.      Currently in Pain?  Yes    Pain Score  5     Pain Orientation  Right                       [facility] Adult PT Treatment/Exercise - 04/26/18 0001      Vasopneumatic   Number Minutes Vasopneumatic   15 minutes    Vasopnuematic Location   Ankle    Vasopneumatic Pressure  Medium    Vasopneumatic Temperature   32 degrees      Manual Therapy   Soft tissue mobilization  posterior tib, gastroc lateral     Kinesiotex  Edema;Facilitate Muscle   "fingers" distally for edema control      Ankle Exercises: Seated   Other Seated Ankle Exercises  self resisted isometric inversion 5x 5 sec hold       Ankle Exercises: Standing   Heel Raises  Both;10 reps    Other Standing Ankle Exercises  front to back weight shifting 10x    Other Standing Ankle Exercises  side to side weight shifting 10x                PT Short Term Goals - 04/10/18 1931      PT SHORT TERM GOAL #1   Title  be independent in initial HEP    Time  4    Period  Weeks    Status  New    Target Date  05/08/18      PT SHORT TERM GOAL #2   Title  report a 25% reduction in right ankle pain  with standing and walking    Time  4    Period  Weeks    Status  On-going      PT SHORT TERM GOAL #3   Title  The patient will have improved ankle dorsiflexion to 3 degrees, inversion to 33 degrees and eversion to 20 degrees  needed for walking and climbing stairs.      Time  4    Period  Weeks    Status  New      PT SHORT TERM GOAL #4   Title  Decreased figure 8 girth measurement to 51 1/2 indicating decreased swelling  Time  4    Period   Weeks    Status  New      PT SHORT TERM GOAL #5   Title  ...        PT Long Term Goals - 04/10/18 1938      PT LONG TERM GOAL #1   Title  be independent in advanced HEP    Time  8    Period  Weeks    Status  New    Target Date  06/05/18      PT LONG TERM GOAL #2   Title  reduce FOTO to < or = 42% limitation    Time  8    Period  Weeks    Status  New      PT LONG TERM GOAL #3   Title  The patient will have improved ankle dorsiflexion to 5 degrees and inversion to 35 degrees, eversion to 25 degrees needed for ambulation, stairs    Time  8    Period  Weeks    Status  New      PT LONG TERM GOAL #4   Title  Right ankle strength grossly 4-/5 needed for walking and standing longer periods of time    Time  8    Period  Weeks    Status  New      PT LONG TERM GOAL #5   Title  The patient will report a 50% improvement in pain with walking 1/4 to 1/2 mile.      Time  8    Period  Weeks    Status  New            Plan - 04/26/18 2047    Clinical Impression Statement  The patient reports frustration over limited improvement.  She reports she feels better immediately following treatment session and improvement in swelling but symptoms return with weightbearing and with her leg in a dependent position.  Initiated dry needling with multiple tender points particularly in lateral gastroc.  Vasocompression and altered kinesiotape for edema control.  Slower progression of strengthening secondary to pain.  Therapist closely monitoring response with all treatment interventions.  Progress may be slower secondary to multiple ortho issues including sciatica and weakness associated with 2 knee surgeries on the right.      Rehab Potential  Good    Clinical Impairments Affecting Rehab Potential  osteopenia;  stenosis;  right TKR    PT Frequency  2x / week    PT Duration  8 weeks    PT Treatment/Interventions  ADLs/Self Care Home Management;Cryotherapy;Electrical Stimulation;Moist  Heat;Iontophoresis 4mg /ml Dexamethasone;Ultrasound;Therapeutic exercise;Therapeutic activities;Neuromuscular re-education;Patient/family education;Manual techniques;Dry needling;Taping;Vasopneumatic Device    PT Next Visit Plan    vasocompression;  assess response to DN;   kinesiotaping       Patient will benefit from skilled therapeutic intervention in order to improve the following deficits and impairments:  Pain, Increased fascial restricitons, Decreased range of motion, Decreased strength, Impaired perceived functional ability, Difficulty walking  Visit Diagnosis: Pain in right ankle and joints of right foot  Stiffness of right ankle, not elsewhere classified  Muscle weakness (generalized)  Localized edema     Problem List Patient Active Problem List   Diagnosis Date Noted  . Other fatigue 07/18/2017  . Shortness of breath on exertion 07/18/2017  . Type 2 diabetes mellitus with retinopathy, with long-term current use of insulin (HCC) 07/18/2017  . Vitamin D deficiency 07/18/2017  . B12 nutritional deficiency 07/18/2017  .  Other specified hypothyroidism 07/18/2017  . Acute pain of right knee 12/22/2016  . Trigger index finger of left hand 09/22/2016  . 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 Diabetes (HCC) 01/08/2014  . Essential hypertension 01/08/2014  . Hyperlipidemia 01/08/2014  . Postoperative anemia due to acute blood loss 11/22/2012  . Hyponatremia 11/06/2012  . OA (osteoarthritis) of knee 11/05/2012  . Villonodular synovitis of knee 04/20/2011   Lavinia Sharps, PT 04/26/18 8:59 PM Phone: [phone] Fax: [phone] Vivien Presto 04/26/2018, 8:58 PM  [facility] [address] [place], [place], [address] Phone: [phone]   Fax:   [phone]  Name: AAYLA SNEAD MRN: [id number] Date of Birth: 08-Jul-1950

## 2018-05-01 ENCOUNTER — Encounter: Payer: Self-pay | Admitting: Physical Therapy

## 2018-05-01 ENCOUNTER — Ambulatory Visit: Payer: 59 | Admitting: Physical Therapy

## 2018-05-01 DIAGNOSIS — M25571 Pain in right ankle and joints of right foot: Secondary | ICD-10-CM

## 2018-05-01 DIAGNOSIS — M6281 Muscle weakness (generalized): Secondary | ICD-10-CM | POA: Diagnosis not present

## 2018-05-01 DIAGNOSIS — M25671 Stiffness of right ankle, not elsewhere classified: Secondary | ICD-10-CM

## 2018-05-01 DIAGNOSIS — R600 Localized edema: Secondary | ICD-10-CM | POA: Diagnosis not present

## 2018-05-01 NOTE — Therapy (Signed)
[facility] [address] [place], [place], [address] Phone: [phone]   Fax:  [phone]  Physical Therapy Treatment  Patient Details  Name: [name] MRN: [id number] Date of Birth: 03/10/1951 Referring Provider (PT): Dr. Jerl Santos   Encounter Date: 05/01/2018  PT End of Session - 05/01/18 1501    Visit Number  6    Date for PT Re-Evaluation  06/05/18    Authorization Type  UMR    PT Start Time  1445    PT Stop Time  1536    PT Time Calculation (min)  51 min    Activity Tolerance  Patient tolerated treatment well    Behavior During Therapy  [facility] for tasks assessed/performed       Past Medical History:  Diagnosis Date  . A-fib (HCC)   . Anemia    hx of  . Arthritis   . Chest pain   . Constipation   . Depression   . Diabetes mellitus ORAL MEDS  . Diabetic retinopathy (HCC)   . Dyspnea    with minimal exertion-deconditioned  . Dyspnea   . Dysrhythmia    a-fib  . Food allergy   . GERD (gastroesophageal reflux disease)    occasional  . Gout   . History of kidney stones   . Hypercholesteremia   . Hyperlipidemia   . Hypertension   . Hypertensive kidney disease   . Hypothyroidism   . Knee pain, right   . 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 Obesity   . OSA on CPAP    uses CPAP nightly  . Osteoarthritis   . Palpitations   . Pinched nerve in neck   . Pleurisy   . Pneumonia    hx of  . PONV (postoperative nausea and vomiting)    for 3-4 days after general anesthesia  . Sciatica   . Swelling of knee joint, right   . Synovitis of knee RIGHT  . Trigger finger, left    left index  . Vitamin D deficiency     Past Surgical History:  Procedure Laterality Date  . CESAREAN SECTION  X3  . KNEE ARTHROSCOPY  04/20/2011   Procedure: ARTHROSCOPY KNEE;  Surgeon: Loanne Drilling, MD;  Location: [facility];  Service: Orthopedics;  Laterality: Right;  WITH SYNOVECTOMY  .  LEFT CARPAL TUNNEL / LEFT MIDDLE & RING FINGER TRIGGER RELEASE  08-26-2008  . LEFT SHOULDER ARTHROSCOPY W/ DEBRIDEMENT  09-09-2003  . LEFT SHOULDER ARTHROSCOPY/ LEFT THUMB TRIGGER RELEASE  02-22-2005  . PHOTOCOAGULATION WITH LASER Right 03/20/2018   Procedure: PHOTOCOAGULATION WITH LASER;  Surgeon: Carmela Rima, MD;  Location: [facility] OR;  Service: Ophthalmology;  Laterality: Right;  . PULLEY RELEASE LEFT LONG FINGER  07-14-2009  . RIGHT CARPAL TUNNEL/ RIGHT THUMB TRIGGER RELEASE'S  11-28-2006  . RIGHT SHOULDER ARTHROSCOPY W/ ROTATOR CUFF REPAIR  01-13-2004  . SHOULDER ARTHROSCOPY DISTAL CLAVICLE EXCISION AND OPEN ROTATOR CUFF REPAIR  09-07-2004   LEFT  . SHOULDER ARTHROSCOPY W/ ACROMIAL REPAIR  11-29-2005   LEFT  . TONSILLECTOMY AND ADENOIDECTOMY  child  . TOTAL KNEE ARTHROPLASTY  12-14-2009   RIGHT  . TOTAL KNEE ARTHROPLASTY Left 11/05/2012   Procedure: LEFT TOTAL KNEE ARTHROPLASTY;  Surgeon: Loanne Drilling, MD;  Location: [facility];  Service: Orthopedics;  Laterality: Left;  . TOTAL KNEE REVISION Right 07/29/2016   Procedure: RIGHT KNEE POLY-LINER EXCHANGE;  Surgeon: Kathryne Hitch, MD;  Location: [facility];  Service: Orthopedics;  Laterality: Right;  . TRIGGER FINGER RELEASE Right 02/21/2013   Procedure: RIGHT RING A-1 PULLEY RELEASE    (MINOR PROCEDURE) ;  Surgeon: Wyn Forster., MD;  Location: [facility];  Service: Orthopedics;  Laterality: Right;  . TRIGGER FINGER RELEASE Left 09/22/2016   Procedure: RELEASE TRIGGER FINGER LEFT INDEX FINGER;  Surgeon: Kathryne Hitch, MD;  Location: [facility] OR;  Service: Orthopedics;  Laterality: Left;  . TRIGGER FINGER RELEASE Right 01/20/2017   Procedure: RELEASE TRIGGER FINGER/A-1 PULLEY RIGHT INDEX FINGER;  Surgeon: Marcene Corning, MD;  Location: [facility];  Service: Orthopedics;  Laterality: Right;  . VITRECTOMY 25 GAUGE WITH SCLERAL BUCKLE Right 03/20/2018   Procedure: RIGHT EYE VITRECTOMY WITH  ENDOLASER  PARENTAL PHOTOCOAGULATION 25 GAUGE;  Surgeon: Carmela Rima, MD;  Location: [facility] OR;  Service: Ophthalmology;  Laterality: Right;    There were no vitals filed for this visit.  Subjective Assessment - 05/01/18 1448    Subjective  Pt reports that things were good with minimal swelling until today. She had to do alot of walking at work. She is doing her HEP.                        [facility] Adult PT Treatment/Exercise - 05/01/18 0001      Vasopneumatic   Number Minutes Vasopneumatic   15 minutes    Vasopnuematic Location   Ankle    Vasopneumatic Pressure  Medium    Vasopneumatic Temperature   32 degrees      Manual Therapy   Joint Mobilization  Grade III-IV AP mobs x3 bouts; Grade III ankle distraction 4x15 sec     Kinesiotex  Edema   anchored above lateral maleolus with fingers distally      Ankle Exercises: Seated   Heel Raises  10 reps   yellow TB pull medially at heel    BAPS  Sitting;Level 1    Other Seated Ankle Exercises  clockwise, counterclockwise, DF/PF, inversion/eversion x20 reps              PT Education - 05/01/18 1525    Education Details  technique with therex     Person(s) Educated  Patient    Methods  Explanation;Verbal cues    Comprehension  Verbalized understanding       PT Short Term Goals - 04/10/18 1931      PT SHORT TERM GOAL #1   Title  be independent in initial HEP    Time  4    Period  Weeks    Status  New    Target Date  05/08/18      PT SHORT TERM GOAL #2   Title  report a 25% reduction in right ankle pain  with standing and walking    Time  4    Period  Weeks    Status  On-going      PT SHORT TERM GOAL #3   Title  The patient will have improved ankle dorsiflexion to 3 degrees, inversion to 33 degrees and eversion to 20 degrees  needed for walking and climbing stairs.      Time  4    Period  Weeks    Status  New      PT SHORT TERM GOAL #4   Title  Decreased figure 8 girth measurement to 51 1/2 indicating  decreased swelling    Time  4    Period  Weeks    Status  New      PT SHORT TERM GOAL #5   Title  ...        PT Long Term Goals - 04/10/18 1938      PT LONG TERM GOAL #1   Title  be independent in advanced HEP    Time  8    Period  Weeks    Status  New    Target Date  06/05/18      PT LONG TERM GOAL #2   Title  reduce FOTO to < or = 42% limitation    Time  8    Period  Weeks    Status  New      PT LONG TERM GOAL #3   Title  The patient will have improved ankle dorsiflexion to 5 degrees and inversion to 35 degrees, eversion to 25 degrees needed for ambulation, stairs    Time  8    Period  Weeks    Status  New      PT LONG TERM GOAL #4   Title  Right ankle strength grossly 4-/5 needed for walking and standing longer periods of time    Time  8    Period  Weeks    Status  New      PT LONG TERM GOAL #5   Title  The patient will report a 50% improvement in pain with walking 1/4 to 1/2 mile.      Time  8    Period  Weeks    Status  New            Plan - 05/01/18 1528    Clinical Impression Statement  Pt arrived with reports of improved swelling over the weekend until today when she had to work on her feet. Session focused on therex to improve ankle stability and proprioception. Pt was able to complete exercises with therapist monitoring pain response. Applied kinesiotape end of session to further promote edema improvements. Will continue with skilled PT intervention to decrease edema, improve ankle strength hand proprioception with daily activity.     Rehab Potential  Good    Clinical Impairments Affecting Rehab Potential  osteopenia;  stenosis;  right TKR    PT Frequency  2x / week    PT Duration  8 weeks    PT Treatment/Interventions  ADLs/Self Care Home Management;Cryotherapy;Electrical Stimulation;Moist Heat;Iontophoresis 4mg /ml Dexamethasone;Ultrasound;Therapeutic exercise;Therapeutic activities;Neuromuscular re-education;Patient/family education;Manual  techniques;Dry needling;Taping;Vasopneumatic Device    PT Next Visit Plan    vasocompression;  pt feels kinesiotaping helped with swelling; ankle proprioception    PT Home Exercise Plan  possible updates; unable to find in medbridge    Consulted and Agree with Plan of Care  Patient       Patient will benefit from skilled therapeutic intervention in order to improve the following deficits and impairments:  Pain, Increased fascial restricitons, Decreased range of motion, Decreased strength, Impaired perceived functional ability, Difficulty walking  Visit Diagnosis: Pain in right ankle and joints of right foot  Stiffness of right ankle, not elsewhere classified  Muscle weakness (generalized)  Localized edema     Problem List Patient Active Problem List   Diagnosis Date Noted  . Other fatigue 07/18/2017  . Shortness of breath on exertion 07/18/2017  . Type 2 diabetes mellitus with retinopathy, with long-term current use of insulin (HCC) 07/18/2017  . Vitamin D deficiency 07/18/2017  . B12 nutritional deficiency 07/18/2017  . Other specified hypothyroidism 07/18/2017  . Acute  pain of right knee 12/22/2016  . Trigger index finger of left hand 09/22/2016  . 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 Diabetes (HCC) 01/08/2014  . Essential hypertension 01/08/2014  . Hyperlipidemia 01/08/2014  . Postoperative anemia due to acute blood loss 11/22/2012  . Hyponatremia 11/06/2012  . OA (osteoarthritis) of knee 11/05/2012  . Villonodular synovitis of knee 04/20/2011    3:39 PM,05/01/18 Donita BrooksSara Nakira Litzau PT, DPT [facility]  [phone]  [facility] [address] [place], [place], [address] Phone: [phone]   Fax:   [phone]  Name: [name] MRN: [id number] Date of Birth: 10-07-1950

## 2018-05-03 ENCOUNTER — Ambulatory Visit: Payer: 59 | Admitting: Physical Therapy

## 2018-05-03 ENCOUNTER — Encounter: Payer: Self-pay | Admitting: Physical Therapy

## 2018-05-03 DIAGNOSIS — M25671 Stiffness of right ankle, not elsewhere classified: Secondary | ICD-10-CM

## 2018-05-03 DIAGNOSIS — M25571 Pain in right ankle and joints of right foot: Secondary | ICD-10-CM | POA: Diagnosis not present

## 2018-05-03 DIAGNOSIS — R600 Localized edema: Secondary | ICD-10-CM | POA: Diagnosis not present

## 2018-05-03 DIAGNOSIS — M6281 Muscle weakness (generalized): Secondary | ICD-10-CM | POA: Diagnosis not present

## 2018-05-03 NOTE — Therapy (Signed)
[facility] [address] [place], [place], [address] Phone: [phone]   Fax:  [phone]  Physical Therapy Treatment  Patient Details  Name: [name] MRN: [id number] Date of Birth: November 21, 1950 Referring Provider (PT): Dr. Jerl Santos   Encounter Date: 05/03/2018  PT End of Session - 05/03/18 1706    Visit Number  7    Date for PT Re-Evaluation  06/05/18    Authorization Type  UMR    PT Start Time  1450    PT Stop Time  1535    PT Time Calculation (min)  45 min    Activity Tolerance  Patient tolerated treatment well       Past Medical History:  Diagnosis Date  . A-fib (HCC)   . Anemia    hx of  . Arthritis   . Chest pain   . Constipation   . Depression   . Diabetes mellitus ORAL MEDS  . Diabetic retinopathy (HCC)   . Dyspnea    with minimal exertion-deconditioned  . Dyspnea   . Dysrhythmia    a-fib  . Food allergy   . GERD (gastroesophageal reflux disease)    occasional  . Gout   . History of kidney stones   . Hypercholesteremia   . Hyperlipidemia   . Hypertension   . Hypertensive kidney disease   . Hypothyroidism   . Knee pain, right   . 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 Obesity   . OSA on CPAP    uses CPAP nightly  . Osteoarthritis   . Palpitations   . Pinched nerve in neck   . Pleurisy   . Pneumonia    hx of  . PONV (postoperative nausea and vomiting)    for 3-4 days after general anesthesia  . Sciatica   . Swelling of knee joint, right   . Synovitis of knee RIGHT  . Trigger finger, left    left index  . Vitamin D deficiency     Past Surgical History:  Procedure Laterality Date  . CESAREAN SECTION  X3  . KNEE ARTHROSCOPY  04/20/2011   Procedure: ARTHROSCOPY KNEE;  Surgeon: Loanne Drilling, MD;  Location: [facility];  Service: Orthopedics;  Laterality: Right;  WITH SYNOVECTOMY  . LEFT CARPAL TUNNEL / LEFT MIDDLE & RING FINGER TRIGGER RELEASE   08-26-2008  . LEFT SHOULDER ARTHROSCOPY W/ DEBRIDEMENT  09-09-2003  . LEFT SHOULDER ARTHROSCOPY/ LEFT THUMB TRIGGER RELEASE  02-22-2005  . PHOTOCOAGULATION WITH LASER Right 03/20/2018   Procedure: PHOTOCOAGULATION WITH LASER;  Surgeon: Carmela Rima, MD;  Location: [facility] OR;  Service: Ophthalmology;  Laterality: Right;  . PULLEY RELEASE LEFT LONG FINGER  07-14-2009  . RIGHT CARPAL TUNNEL/ RIGHT THUMB TRIGGER RELEASE'S  11-28-2006  . RIGHT SHOULDER ARTHROSCOPY W/ ROTATOR CUFF REPAIR  01-13-2004  . SHOULDER ARTHROSCOPY DISTAL CLAVICLE EXCISION AND OPEN ROTATOR CUFF REPAIR  09-07-2004   LEFT  . SHOULDER ARTHROSCOPY W/ ACROMIAL REPAIR  11-29-2005   LEFT  . TONSILLECTOMY AND ADENOIDECTOMY  child  . TOTAL KNEE ARTHROPLASTY  12-14-2009   RIGHT  . TOTAL KNEE ARTHROPLASTY Left 11/05/2012   Procedure: LEFT TOTAL KNEE ARTHROPLASTY;  Surgeon: Loanne Drilling, MD;  Location: [facility];  Service: Orthopedics;  Laterality: Left;  . TOTAL KNEE REVISION Right 07/29/2016   Procedure: RIGHT KNEE POLY-LINER EXCHANGE;  Surgeon: Kathryne Hitch, MD;  Location: [facility];  Service: Orthopedics;  Laterality: Right;  . TRIGGER FINGER  RELEASE Right 02/21/2013   Procedure: RIGHT RING A-1 PULLEY RELEASE    (MINOR PROCEDURE) ;  Surgeon: Wyn Forsterobert V Sypher Jr., MD;  [facility];  Service: Orthopedics;  Laterality: Right;  . TRIGGER FINGER RELEASE Left 09/22/2016   Procedure: RELEASE TRIGGER FINGER LEFT INDEX FINGER;  Surgeon: Kathryne HitchBlackman, Christopher Y, MD;  Location: [facility];  Service: Orthopedics;  Laterality: Left;  . TRIGGER FINGER RELEASE Right 01/20/2017   Procedure: RELEASE TRIGGER FINGER/A-1 PULLEY RIGHT INDEX FINGER;  Surgeon: Marcene Corningalldorf, Peter, MD;  Location: [facility];  Service: Orthopedics;  Laterality: Right;  . VITRECTOMY 25 GAUGE WITH SCLERAL BUCKLE Right 03/20/2018   Procedure: RIGHT EYE VITRECTOMY WITH  ENDOLASER PARENTAL PHOTOCOAGULATION 25 GAUGE;  Surgeon: Carmela RimaPatel, Narendra, MD;   [facility];  Service: Ophthalmology;  Laterality: Right;    There were no vitals filed for this visit.  Subjective Assessment - 05/03/18 1702    Subjective  It's the same.  Bothers me at work.  Pain and swelling reported around lateral malleoli.      Currently in Pain?  Yes    Pain Score  5     Pain Location  Ankle    Pain Orientation  Right    Pain Type  Chronic pain                       [facility] Adult PT Treatment/Exercise - 05/03/18 0001      Vasopneumatic   Number Minutes Vasopneumatic   15 minutes    Vasopnuematic Location   Ankle    Vasopneumatic Pressure  Medium    Vasopneumatic Temperature   32 degrees      Manual Therapy   Soft tissue mobilization  Instrument assisted Addaday using "happy face" attachment to medial and lateral calf with and without ankle inversion ROM       Kinesiotix   Facilitate Muscle   I strip along lateral leg and over lateral malleolus with "fingers" to lateral side of foot for swelling      Ankle Exercises: Supine   Isometrics  Isometric inversion with ball squeeze 15x     T-Band  red band doubled around feet for isometric eversion 15x    Other Supine Ankle Exercises  Right LE elevated on large green ball with red band PF 10x, PF with inversion 10x, PF with eversion 10x               PT Short Term Goals - 04/10/18 1931      PT SHORT TERM GOAL #1   Title  be independent in initial HEP    Time  4    Period  Weeks    Status  New    Target Date  05/08/18      PT SHORT TERM GOAL #2   Title  report a 25% reduction in right ankle pain  with standing and walking    Time  4    Period  Weeks    Status  On-going      PT SHORT TERM GOAL #3   Title  The patient will have improved ankle dorsiflexion to 3 degrees, inversion to 33 degrees and eversion to 20 degrees  needed for walking and climbing stairs.      Time  4    Period  Weeks    Status  New      PT SHORT TERM GOAL #4   Title  Decreased figure 8 girth  measurement  to 51 1/2 indicating decreased swelling    Time  4    Period  Weeks    Status  New      PT SHORT TERM GOAL #5   Title  ...        PT Long Term Goals - 04/10/18 1938      PT LONG TERM GOAL #1   Title  be independent in advanced HEP    Time  8    Period  Weeks    Status  New    Target Date  06/05/18      PT LONG TERM GOAL #2   Title  reduce FOTO to < or = 42% limitation    Time  8    Period  Weeks    Status  New      PT LONG TERM GOAL #3   Title  The patient will have improved ankle dorsiflexion to 5 degrees and inversion to 35 degrees, eversion to 25 degrees needed for ambulation, stairs    Time  8    Period  Weeks    Status  New      PT LONG TERM GOAL #4   Title  Right ankle strength grossly 4-/5 needed for walking and standing longer periods of time    Time  8    Period  Weeks    Status  New      PT LONG TERM GOAL #5   Title  The patient will report a 50% improvement in pain with walking 1/4 to 1/2 mile.      Time  8    Period  Weeks    Status  New            Plan - 05/03/18 1706    Clinical Impression Statement  The patient continues to have "end of the day" swelling following her work day with legs in a dependent position.  Performed all ex's in a elevated position above heart level.  Instrument assisted manual therapy to for fascial mobility but also to guide fluid toward lymphatic vessels.  Therapist closely monitoring response with all treatment interventions.      Rehab Potential  Good    Clinical Impairments Affecting Rehab Potential  osteopenia;  stenosis;  right TKR    PT Frequency  2x / week    PT Duration  8 weeks    PT Treatment/Interventions  ADLs/Self Care Home Management;Cryotherapy;Electrical Stimulation;Moist Heat;Iontophoresis 4mg /ml Dexamethasone;Ultrasound;Therapeutic exercise;Therapeutic activities;Neuromuscular re-education;Patient/family education;Manual techniques;Dry needling;Taping;Vasopneumatic Device    PT Next Visit  Plan   ex in elevation;  Addaday gentle in lymphatic direction;  vasocompression;   kinesiotaping; ankle proprioception; check progress toward goals        Patient will benefit from skilled therapeutic intervention in order to improve the following deficits and impairments:  Pain, Increased fascial restricitons, Decreased range of motion, Decreased strength, Impaired perceived functional ability, Difficulty walking  Visit Diagnosis: Pain in right ankle and joints of right foot  Stiffness of right ankle, not elsewhere classified  Muscle weakness (generalized)  Localized edema     Problem List Patient Active Problem List   Diagnosis Date Noted  . Other fatigue 07/18/2017  . Shortness of breath on exertion 07/18/2017  . Type 2 diabetes mellitus with retinopathy, with long-term current use of insulin (HCC) 07/18/2017  . Vitamin D deficiency 07/18/2017  . B12 nutritional deficiency 07/18/2017  . Other specified hypothyroidism 07/18/2017  . Acute pain of right knee 12/22/2016  . Trigger index finger  of left hand 09/22/2016  . 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 Diabetes (HCC) 01/08/2014  . Essential hypertension 01/08/2014  . Hyperlipidemia 01/08/2014  . Postoperative anemia due to acute blood loss 11/22/2012  . Hyponatremia 11/06/2012  . OA (osteoarthritis) of knee 11/05/2012  . Villonodular synovitis of knee 04/20/2011   Lavinia Sharps, PT 05/03/18 5:13 PM Phone: [phone] Fax: [phone] Vivien Presto 05/03/2018, 5:13 PM  [facility] [address], [address] [place], [place], [address] Phone: [phone]   Fax:  [phone]  Name: [name] MRN: [id number] Date of Birth: 1950-04-23

## 2018-05-08 ENCOUNTER — Encounter: Payer: Self-pay | Admitting: Physical Therapy

## 2018-05-08 ENCOUNTER — Ambulatory Visit: Payer: 59 | Admitting: Physical Therapy

## 2018-05-08 DIAGNOSIS — M25671 Stiffness of right ankle, not elsewhere classified: Secondary | ICD-10-CM

## 2018-05-08 DIAGNOSIS — R600 Localized edema: Secondary | ICD-10-CM

## 2018-05-08 DIAGNOSIS — M25571 Pain in right ankle and joints of right foot: Secondary | ICD-10-CM | POA: Diagnosis not present

## 2018-05-08 DIAGNOSIS — M6281 Muscle weakness (generalized): Secondary | ICD-10-CM

## 2018-05-08 NOTE — Therapy (Signed)
[facility] [address], [address] [place], [place], [address] Phone: [phone]   Fax:  [phone]  Physical Therapy Treatment  Patient Details  Name: [name] MRN: [id number] Date of Birth: 1950-08-09 Referring Provider (PT): Dr. Jerl Santos   Encounter Date: 05/08/2018  PT End of Session - 05/08/18 1729    Visit Number  8    Date for PT Re-Evaluation  06/05/18    Authorization Type  [facility]    PT Start Time  1445    PT Stop Time  1535    PT Time Calculation (min)  50 min    Activity Tolerance  Patient tolerated treatment well       Past Medical History:  Diagnosis Date  . A-fib (HCC)   . Anemia    hx of  . Arthritis   . Chest pain   . Constipation   . Depression   . Diabetes mellitus [facility]  . Diabetic retinopathy (HCC)   . Dyspnea    with minimal exertion-deconditioned  . Dyspnea   . Dysrhythmia    a-fib  . Food allergy   . GERD (gastroesophageal reflux disease)    occasional  . Gout   . History of kidney stones   . Hypercholesteremia   . Hyperlipidemia   . Hypertension   . Hypertensive kidney disease   . Hypothyroidism   . Knee pain, right   . 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 Obesity   . OSA on CPAP    uses CPAP nightly  . Osteoarthritis   . Palpitations   . Pinched nerve in neck   . Pleurisy   . Pneumonia    hx of  . PONV (postoperative nausea and vomiting)    for 3-4 days after general anesthesia  . Sciatica   . Swelling of knee joint, right   . Synovitis of knee RIGHT  . Trigger finger, left    left index  . Vitamin D deficiency     Past Surgical History:  Procedure Laterality Date  . CESAREAN SECTION  X3  . KNEE ARTHROSCOPY  04/20/2011   Procedure: ARTHROSCOPY KNEE;  Surgeon: Loanne Drilling, MD;  Location: [facility];  Service: Orthopedics;  Laterality: Right;  WITH SYNOVECTOMY  . LEFT CARPAL TUNNEL / LEFT MIDDLE & RING FINGER TRIGGER RELEASE   08-26-2008  . LEFT SHOULDER ARTHROSCOPY W/ DEBRIDEMENT  09-09-2003  . LEFT SHOULDER ARTHROSCOPY/ LEFT THUMB TRIGGER RELEASE  02-22-2005  . PHOTOCOAGULATION WITH LASER Right 03/20/2018   Procedure: PHOTOCOAGULATION WITH LASER;  Surgeon: Carmela Rima, MD;  Location[facility];  Service: Ophthalmology;  Laterality: Right;  . PULLEY RELEASE LEFT LONG FINGER  07-14-2009  . RIGHT CARPAL TUNNEL/ RIGHT THUMB TRIGGER RELEASE'S  11-28-2006  . RIGHT SHOULDER ARTHROSCOPY W/ ROTATOR CUFF REPAIR  01-13-2004  . SHOULDER ARTHROSCOPY DISTAL CLAVICLE EXCISION AND OPEN ROTATOR CUFF REPAIR  09-07-2004   LEFT  . SHOULDER ARTHROSCOPY W/ ACROMIAL REPAIR  11-29-2005   LEFT  . TONSILLECTOMY AND ADENOIDECTOMY  child  . TOTAL KNEE ARTHROPLASTY  12-14-2009   RIGHT  . TOTAL KNEE ARTHROPLASTY Left 11/05/2012   Procedure: LEFT TOTAL KNEE ARTHROPLASTY;  Surgeon: Loanne Drilling, MD;  Location: [facility];  Service: Orthopedics;  Laterality: Left;  . TOTAL KNEE REVISION Right 07/29/2016   Procedure: RIGHT KNEE POLY-LINER EXCHANGE;  Surgeon: Kathryne Hitch, MD;  Location: [facility];  Service: Orthopedics;  Laterality: Right;  . TRIGGER FINGER  RELEASE Right 02/21/2013   Procedure: RIGHT RING A-1 PULLEY RELEASE    (MINOR PROCEDURE) ;  Surgeon: Wyn Forster., MD;  Location: [facility];  Service: Orthopedics;  Laterality: Right;  . TRIGGER FINGER RELEASE Left 09/22/2016   Procedure: RELEASE TRIGGER FINGER LEFT INDEX FINGER;  Surgeon: Kathryne Hitch, MD;  Location: [facility];  Service: Orthopedics;  Laterality: Left;  . TRIGGER FINGER RELEASE Right 01/20/2017   Procedure: RELEASE TRIGGER FINGER/A-1 PULLEY RIGHT INDEX FINGER;  Surgeon: Marcene Corning, MD;  Location: [facility];  Service: Orthopedics;  Laterality: Right;  . VITRECTOMY 25 GAUGE WITH SCLERAL BUCKLE Right 03/20/2018   Procedure: RIGHT EYE VITRECTOMY WITH  ENDOLASER PARENTAL PHOTOCOAGULATION 25 GAUGE;  Surgeon: Carmela Rima, MD;   Location: [facility] OR;  Service: Ophthalmology;  Laterality: Right;    There were no vitals filed for this visit.  Subjective Assessment - 05/08/18 1456    Subjective  Reports she wore a neoprene sleeve today which helped swelling but bothered the front of the ankle.      Currently in Pain?  Yes    Pain Score  4     Pain Location  Ankle    Pain Orientation  Right                       [facility] Adult PT Treatment/Exercise - 05/08/18 0001      Vasopneumatic   Number Minutes Vasopneumatic   15 minutes    Vasopnuematic Location   Ankle    Vasopneumatic Pressure  Medium    Vasopneumatic Temperature   32 degrees      Manual Therapy   Soft tissue mobilization  instrument assisted fanning sweeping lateral leg and around lateral malleolus fanning/sweeping       Ankle Exercises: Supine   Isometrics  --    T-Band  red band around feet for isometric eversion 15x    Other Supine Ankle Exercises  green band DF 20x therapist holding     Other Supine Ankle Exercises  green band PF with elevation 20x       Ankle Exercises: Seated   Heel Raises  --   ball b/w feet for isometric inversion with DF and PF 15x ea   BAPS  Sitting;Level 1    Other Seated Ankle Exercises  clockwise, counterclockwise, DF/PF, inversion/eversion x20 reps     Other Seated Ankle Exercises  red band around feet isometric eversion with ankle DF and PF 15x each                PT Short Term Goals - 05/08/18 1738      PT SHORT TERM GOAL #1   Title  be independent in initial HEP    Status  Achieved      PT SHORT TERM GOAL #2   Title  report a 25% reduction in right ankle pain  with standing and walking    Time  4    Period  Weeks    Status  On-going      PT SHORT TERM GOAL #3   Title  The patient will have improved ankle dorsiflexion to 3 degrees, inversion to 33 degrees and eversion to 20 degrees  needed for walking and climbing stairs.      Time  4    Period  Weeks    Status  On-going      PT  SHORT TERM GOAL #4   Title  Decreased figure  8 girth measurement to 51 1/2 indicating decreased swelling    Time  4    Period  Weeks    Status  On-going        PT Long Term Goals - 04/10/18 1938      PT LONG TERM GOAL #1   Title  be independent in advanced HEP    Time  8    Period  Weeks    Status  New    Target Date  06/05/18      PT LONG TERM GOAL #2   Title  reduce FOTO to < or = 42% limitation    Time  8    Period  Weeks    Status  New      PT LONG TERM GOAL #3   Title  The patient will have improved ankle dorsiflexion to 5 degrees and inversion to 35 degrees, eversion to 25 degrees needed for ambulation, stairs    Time  8    Period  Weeks    Status  New      PT LONG TERM GOAL #4   Title  Right ankle strength grossly 4-/5 needed for walking and standing longer periods of time    Time  8    Period  Weeks    Status  New      PT LONG TERM GOAL #5   Title  The patient will report a 50% improvement in pain with walking 1/4 to 1/2 mile.      Time  8    Period  Weeks    Status  New            Plan - 05/08/18 1729    Clinical Impression Statement  The patient has decreased lateral ankle swelling today.  Ankle eversion continues to be the most painful motion but she is able to perform isometrically with minimal discomfort.  Progress has been slower secondary to numerous co-morbidities and her job duties which require her LE to be in a dependent position all day.  Good temporary relief with vasocompression.  Myofascial tender points in lateral gastroc and fibularis muscles.  Therapist closely monitoring response with all treatment interventions.      Rehab Potential  Good    Clinical Impairments Affecting Rehab Potential  osteopenia;  stenosis;  right TKR    PT Frequency  2x / week    PT Duration  8 weeks    PT Treatment/Interventions  ADLs/Self Care Home Management;Cryotherapy;Electrical Stimulation;Moist Heat;Iontophoresis 4mg /ml Dexamethasone;Ultrasound;Therapeutic  exercise;Therapeutic activities;Neuromuscular re-education;Patient/family education;Manual techniques;Dry needling;Taping;Vasopneumatic Device    PT Next Visit Plan  check progress toward STGS next visit;  recheck ROM and girth measurements for swelling fig 8;  ankle proprioception; vasocompression        Patient will benefit from skilled therapeutic intervention in order to improve the following deficits and impairments:  Pain, Increased fascial restricitons, Decreased range of motion, Decreased strength, Impaired perceived functional ability, Difficulty walking  Visit Diagnosis: Pain in right ankle and joints of right foot  Stiffness of right ankle, not elsewhere classified  Muscle weakness (generalized)  Localized edema     Problem List Patient Active Problem List   Diagnosis Date Noted  . Other fatigue 07/18/2017  . Shortness of breath on exertion 07/18/2017  . Type 2 diabetes mellitus with retinopathy, with long-term current use of insulin (HCC) 07/18/2017  . Vitamin D deficiency 07/18/2017  . B12 nutritional deficiency 07/18/2017  . Other specified hypothyroidism 07/18/2017  . Acute pain of  right knee 12/22/2016  . Trigger index finger of left hand 09/22/2016  . 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 Diabetes (HCC) 01/08/2014  . Essential hypertension 01/08/2014  . Hyperlipidemia 01/08/2014  . Postoperative anemia due to acute blood loss 11/22/2012  . Hyponatremia 11/06/2012  . OA (osteoarthritis) of knee 11/05/2012  . Villonodular synovitis of knee 04/20/2011   Lavinia Sharps, PT 05/08/18 5:39 PM Phone: [phone] Fax: [phone] Vivien Presto 05/08/2018, 5:39 PM  [facility] [address] [place], [place], [address] Phone: [phone]   Fax:   [phone]  Name: YULITZA SHORTS MRN: [id number] Date of Birth: 1950/11/20

## 2018-05-09 NOTE — Progress Notes (Signed)
Cardiology Office Note   Date:  05/14/2018   ID:  [name], DOB Dec 04, 1950, MRN [id number]  PCP:  Marden Noble, MD  Cardiologist:   Charlton Haws, MD   No chief complaint on file.     History of Present Illness: [name] is a 68 y.o. female who presents for f/u of PAF. She has been Rx with flecainide, xarelto and beta blocker TTE March 2018 and 2019  normal EF and normal LA size. Normal myovue 07/21/16  Has had multiple orthopedic surgeries last  2 years and held NOAC with no issues Also lumbar spine injections CHADVASC 4 no bleeding issues   She injured her back and left foot. Weight is up and DM poorly controlled Weight loss doctor wanted to start Wellbutrin but interacts with flecainide I told her we could lower dose and monitor ECG if she needed to use this medicine as her weight is a bigger health issue than her PAF at this time as she has maintained NSR for a while   Past Medical History:  Diagnosis Date  . A-fib (HCC)   . Anemia    hx of  . Arthritis   . Chest pain   . Constipation   . Depression   . Diabetes mellitus [facility]  . Diabetic retinopathy (HCC)   . Dyspnea    with minimal exertion-deconditioned  . Dyspnea   . Dysrhythmia    a-fib  . Food allergy   . GERD (gastroesophageal reflux disease)    occasional  . Gout   . History of kidney stones   . Hypercholesteremia   . Hyperlipidemia   . Hypertension   . Hypertensive kidney disease   . Hypothyroidism   . Knee pain, right   . 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 Obesity   . OSA on CPAP    uses CPAP nightly  . Osteoarthritis   . Palpitations   . Pinched nerve in neck   . Pleurisy   . Pneumonia    hx of  . PONV (postoperative nausea and vomiting)    for 3-4 days after general anesthesia  . Sciatica   . Swelling of knee joint, right   . Synovitis of knee RIGHT  . Trigger finger, left    left index  . Vitamin D deficiency     Past Surgical History:  Procedure  Laterality Date  . CESAREAN SECTION  X3  . KNEE ARTHROSCOPY  04/20/2011   Procedure: ARTHROSCOPY KNEE;  Surgeon: Loanne Drilling, MD;  Location: [facility];  Service: Orthopedics;  Laterality: Right;  WITH SYNOVECTOMY  . LEFT CARPAL TUNNEL / LEFT MIDDLE & RING FINGER TRIGGER RELEASE  08-26-2008  . LEFT SHOULDER ARTHROSCOPY W/ DEBRIDEMENT  09-09-2003  . LEFT SHOULDER ARTHROSCOPY/ LEFT THUMB TRIGGER RELEASE  02-22-2005  . PHOTOCOAGULATION WITH LASER Right 03/20/2018   Procedure: PHOTOCOAGULATION WITH LASER;  Surgeon: Carmela Rima, MD;  Location: [facility] OR;  Service: Ophthalmology;  Laterality: Right;  . PULLEY RELEASE LEFT LONG FINGER  07-14-2009  . RIGHT CARPAL TUNNEL/ RIGHT THUMB TRIGGER RELEASE'S  11-28-2006  . RIGHT SHOULDER ARTHROSCOPY W/ ROTATOR CUFF REPAIR  01-13-2004  . SHOULDER ARTHROSCOPY DISTAL CLAVICLE EXCISION AND OPEN ROTATOR CUFF REPAIR  09-07-2004   LEFT  . SHOULDER ARTHROSCOPY W/ ACROMIAL REPAIR  11-29-2005   LEFT  . TONSILLECTOMY AND ADENOIDECTOMY  child  . TOTAL KNEE ARTHROPLASTY  12-14-2009   RIGHT  . TOTAL KNEE ARTHROPLASTY Left  11/05/2012   Procedure: LEFT TOTAL KNEE ARTHROPLASTY;  Surgeon: Loanne Drilling, MD;  Location: [facility];  Service: Orthopedics;  Laterality: Left;  . TOTAL KNEE REVISION Right 07/29/2016   Procedure: RIGHT KNEE POLY-LINER EXCHANGE;  Surgeon: Kathryne Hitch, MD;  Location: [facility];  Service: Orthopedics;  Laterality: Right;  . TRIGGER FINGER RELEASE Right 02/21/2013   Procedure: RIGHT RING A-1 PULLEY RELEASE    (MINOR PROCEDURE) ;  Surgeon: Wyn Forster., MD;  Location: [facility];  Service: Orthopedics;  Laterality: Right;  . TRIGGER FINGER RELEASE Left 09/22/2016   Procedure: RELEASE TRIGGER FINGER LEFT INDEX FINGER;  Surgeon: Kathryne Hitch, MD;  Location: [facility];  Service: Orthopedics;  Laterality: Left;  . TRIGGER FINGER RELEASE Right 01/20/2017   Procedure: RELEASE TRIGGER FINGER/A-1 PULLEY RIGHT  INDEX FINGER;  Surgeon: Marcene Corning, MD;  Location: [facility];  Service: Orthopedics;  Laterality: Right;  . VITRECTOMY 25 GAUGE WITH SCLERAL BUCKLE Right 03/20/2018   Procedure: RIGHT EYE VITRECTOMY WITH  ENDOLASER PARENTAL PHOTOCOAGULATION 25 GAUGE;  Surgeon: Carmela Rima, MD;  Location: [facility] OR;  Service: Ophthalmology;  Laterality: Right;     Current Outpatient Medications  Medication Sig Dispense Refill  . amLODipine (NORVASC) 5 MG tablet Take 5 mg by mouth every evening.     [name] amoxicillin (AMOXIL) 500 MG capsule Take 2,000 mg by mouth as directed. Take 2000 mg 1 hour prior to dental work  0  . atorvastatin (LIPITOR) 40 MG tablet Take 40 mg by mouth every evening.     . calcium carbonate (CALCIUM 600) 600 MG TABS tablet Take 600 mg by mouth daily with breakfast.    . cholecalciferol (VITAMIN D3) 25 MCG (1000 UT) tablet Take 1,000 Units by mouth daily.    . DULoxetine (CYMBALTA) 60 MG capsule Take 60 mg by mouth daily.    . flecainide (TAMBOCOR) 50 MG tablet Take 1 tablet (50 mg total) by mouth 2 (two) times daily. Please keep appt in March for future refills. Thank you. 180 tablet 0  . hydrochlorothiazide (MICROZIDE) 12.5 MG capsule Take 12.5 mg by mouth daily.   3  . HYDROcodone-acetaminophen (NORCO/VICODIN) 5-325 MG tablet TAKE 1 TABLET BY MOUTH EVERY 6 HOURS AS NEEDED FOR MODERATE PAIN. (Patient taking differently: Take 1 tablet by mouth every 6 (six) hours as needed for moderate pain or severe pain. ) 30 tablet 0  . indomethacin (INDOCIN) 50 MG capsule Take 50 mg by mouth 3 (three) times daily as needed (gout).    . insulin glargine (LANTUS) 100 UNIT/ML injection Inject 40 Units into the skin 2 (two) times daily.     [name] ketorolac (TORADOL) 10 MG tablet Take 10 mg by mouth 3 (three) times daily as needed (CHEST WALL PAIN/PLURACY).    [name] levothyroxine (SYNTHROID, LEVOTHROID) 125 MCG tablet Take 125 mcg by mouth daily before breakfast.     . Melatonin 5 MG CAPS Take 5-10  mg by mouth at bedtime as needed (sleep).     . metFORMIN (GLUCOPHAGE-XR) 500 MG 24 hr tablet Take 1,000 mg by mouth daily with supper.    . methocarbamol (ROBAXIN) 500 MG tablet Take 1 tablet (500 mg total) by mouth every 6 (six) hours as needed for muscle spasms. 60 tablet 1  . metoprolol tartrate (LOPRESSOR) 25 MG tablet Take 25 mg by mouth 2 (two) times daily.    [name] omeprazole (PRILOSEC) 20 MG capsule Take 20 mg by mouth daily.     [name]  prednisoLONE acetate (PRED FORTE) 1 % ophthalmic suspension 1 drop 4 (four) times daily.    . TRULICITY 1.5 MG/0.5ML SOPN Inject 1.5 mg as directed every Sunday.   11  . valsartan (DIOVAN) 320 MG tablet Take 320 mg by mouth daily.   3  . XARELTO 20 MG TABS tablet TAKE 1 TABLET BY MOUTH ONCE DAILY WITH SUPPER (Patient taking differently: Take 20 mg by mouth daily with supper. ) 30 tablet 9   No current facility-administered medications for this visit.     Allergies:   Actos [pioglitazone hydrochloride]; Avandia [rosiglitazone]; Tetanus toxoids; and Food    Social History:  The patient  reports that she quit smoking about 41 years ago. Her smoking use included cigarettes. She quit after 5.00 years of use. She has never used smokeless tobacco. She reports that she does not drink alcohol or use drugs.   Family History:  The patient's family history includes Alcoholism in her father; Cancer in her father; Diabetes in her mother; Heart attack in her mother; Hypertension in her mother; Liver disease in her father; Obesity in her mother; Sleep apnea in her father; Thyroid disease in her mother.    ROS:  Please see the history of present illness.   Otherwise, review of systems are positive for none.   All other systems are reviewed and negative.    PHYSICAL EXAM: VS:  BP (!) 142/66   Pulse 71   Ht 5\' 1"  (1.549 m)   Wt 119.3 kg   SpO2 97%   BMI 49.69 kg/m  , BMI Body mass index is 49.69 kg/m. Affect appropriate Healthy:  appears stated age HEENT: normal Neck  supple with no adenopathy JVP normal no bruits no thyromegaly Lungs clear with no wheezing and good diaphragmatic motion Heart:  S1/S2 no murmur, no rub, gallop or click PMI normal Abdomen: benighn, BS positve, no tenderness, no AAA no bruit.  No HSM or HJR Distal pulses intact with no bruits No edema Neuro non-focal Skin warm and dry No muscular weakness    EKG:  07/18/17 SR rate 62 normal 05/14/18 SR rate 71 QT 424 QRS 104 msec poor R wave progression    Recent Labs: 05/17/2017: NT-Pro BNP 65 07/18/2017: TSH 3.740 11/28/2017: ALT 24 03/20/2018: BUN 22; Creatinine, Ser 1.20; Hemoglobin 13.5; Platelets 332; Potassium 4.0; Sodium 138    Lipid Panel    Component Value Date/Time   CHOL 126 07/18/2017 1112   TRIG 98 07/18/2017 1112   HDL 43 07/18/2017 1112   LDLCALC 63 07/18/2017 1112      Wt Readings from Last 3 Encounters:  05/14/18 119.3 kg  04/16/18 114.3 kg  01/17/17 122 kg      Other studies Reviewed: Additional studies/ records that were reviewed today include: notes from othro, labs TTE 2018/2019 myovue ECG;s .    ASSESSMENT AND PLAN:  1.  PAF:  Maintaining NSR continue flecainide 2. Anticoagulation no bleeding issues continue NOAC 3. HTN:  Well controlled.  Continue current medications and low sodium Dash type diet.   4. HLD  Continue statin labs with primary  5. Obesity:  F/u weight loss clinic she will call us if she is to start Wellbutrin so we can lower flecainide dose and watch ECG    Current medicines are reviewed at length with the patient today.  The patient does not have concerns regarding medicines.  The following changes have been made:  no change  Labs/ tests ordered today include: None  No  orders of the defined types were placed in this encounter.    Disposition:   FU with cardiology in a year      Signed, Charlton Haws, MD  05/14/2018 3:04 PM    [facility] [address] [place], [place], [place]  [address] Phone: [phone]; Fax: [phone]

## 2018-05-10 ENCOUNTER — Encounter: Payer: Self-pay | Admitting: Physical Therapy

## 2018-05-10 ENCOUNTER — Ambulatory Visit: Payer: 59 | Admitting: Physical Therapy

## 2018-05-10 DIAGNOSIS — M25571 Pain in right ankle and joints of right foot: Secondary | ICD-10-CM

## 2018-05-10 DIAGNOSIS — M6281 Muscle weakness (generalized): Secondary | ICD-10-CM | POA: Diagnosis not present

## 2018-05-10 DIAGNOSIS — R600 Localized edema: Secondary | ICD-10-CM | POA: Diagnosis not present

## 2018-05-10 DIAGNOSIS — M25671 Stiffness of right ankle, not elsewhere classified: Secondary | ICD-10-CM

## 2018-05-10 NOTE — Therapy (Signed)
[facility] [address], [place], [place], [address] Phone: [phone]   Fax:  [phone]  Physical Therapy Treatment  Patient Details  Name: [name] MRN: [id number] Date of Birth: 1951/01/24 Referring Provider (PT): Dr. Rhona Raider   Encounter Date: 05/10/2018  PT End of Session - 05/10/18 1457    Visit Number  9    Date for PT Re-Evaluation  06/05/18    Authorization Type  UMR    PT Start Time  2778    PT Stop Time  1535    PT Time Calculation (min)  42 min    Activity Tolerance  Patient tolerated treatment well;No increased pain    Behavior During Therapy  WFL for tasks assessed/performed       Past Medical History:  Diagnosis Date  . A-fib ([place])   . Anemia    hx of  . Arthritis   . Chest pain   . Constipation   . Depression   . Diabetes mellitus ORAL MEDS  . Diabetic retinopathy ([place])   . Dyspnea    with minimal exertion-deconditioned  . Dyspnea   . Dysrhythmia    a-fib  . Food allergy   . GERD (gastroesophageal reflux disease)    occasional  . Gout   . History of kidney stones   . Hypercholesteremia   . Hyperlipidemia   . Hypertension   . Hypertensive kidney disease   . Hypothyroidism   . Knee pain, right   . 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 Obesity   . OSA on CPAP    uses CPAP nightly  . Osteoarthritis   . Palpitations   . Pinched nerve in neck   . Pleurisy   . Pneumonia    hx of  . PONV (postoperative nausea and vomiting)    for 3-4 days after general anesthesia  . Sciatica   . Swelling of knee joint, right   . Synovitis of knee RIGHT  . Trigger finger, left    left index  . Vitamin D deficiency     Past Surgical History:  Procedure Laterality Date  . CESAREAN SECTION  X3  . KNEE ARTHROSCOPY  04/20/2011   Procedure: ARTHROSCOPY KNEE;  Surgeon: Gearlean Alf, MD;  Location: [facility];  Service: Orthopedics;  Laterality: Right;  WITH  SYNOVECTOMY  . LEFT CARPAL TUNNEL / LEFT MIDDLE & RING FINGER TRIGGER RELEASE  08-26-2008  . LEFT SHOULDER ARTHROSCOPY W/ DEBRIDEMENT  09-09-2003  . LEFT SHOULDER ARTHROSCOPY/ LEFT THUMB TRIGGER RELEASE  02-22-2005  . PHOTOCOAGULATION WITH LASER Right 03/20/2018   Procedure: PHOTOCOAGULATION WITH LASER;  Surgeon: Jalene Mullet, MD;  Location: [place];  Service: Ophthalmology;  Laterality: Right;  . PULLEY RELEASE LEFT LONG FINGER  07-14-2009  . RIGHT CARPAL TUNNEL/ RIGHT THUMB TRIGGER RELEASE'S  11-28-2006  . RIGHT SHOULDER ARTHROSCOPY W/ ROTATOR CUFF REPAIR  01-13-2004  . SHOULDER ARTHROSCOPY DISTAL CLAVICLE EXCISION AND OPEN ROTATOR CUFF REPAIR  09-07-2004   LEFT  . SHOULDER ARTHROSCOPY W/ ACROMIAL REPAIR  11-29-2005   LEFT  . TONSILLECTOMY AND ADENOIDECTOMY  child  . TOTAL KNEE ARTHROPLASTY  12-14-2009   RIGHT  . TOTAL KNEE ARTHROPLASTY Left 11/05/2012   Procedure: LEFT TOTAL KNEE ARTHROPLASTY;  Surgeon: Gearlean Alf, MD;  Location: [facility];  Service: Orthopedics;  Laterality: Left;  . TOTAL KNEE REVISION Right 07/29/2016   Procedure: RIGHT KNEE POLY-LINER EXCHANGE;  Surgeon: Mcarthur Rossetti, MD;  Location: [facility];  Service: Orthopedics;  Laterality: Right;  . TRIGGER FINGER RELEASE Right 02/21/2013   Procedure: RIGHT RING A-1 PULLEY RELEASE    (MINOR PROCEDURE) ;  Surgeon: Cammie Sickle., MD;  Location: [facility];  Service: Orthopedics;  Laterality: Right;  . TRIGGER FINGER RELEASE Left 09/22/2016   Procedure: RELEASE TRIGGER FINGER LEFT INDEX FINGER;  Surgeon: Mcarthur Rossetti, MD;  Location: [place];  Service: Orthopedics;  Laterality: Left;  . TRIGGER FINGER RELEASE Right 01/20/2017   Procedure: RELEASE TRIGGER FINGER/A-1 PULLEY RIGHT INDEX FINGER;  Surgeon: Melrose Nakayama, MD;  Location: [place];  Service: Orthopedics;  Laterality: Right;  . VITRECTOMY 25 GAUGE WITH SCLERAL BUCKLE Right 03/20/2018   Procedure: RIGHT EYE VITRECTOMY WITH   ENDOLASER PARENTAL PHOTOCOAGULATION 25 GAUGE;  Surgeon: Jalene Mullet, MD;  Location: [place];  Service: Ophthalmology;  Laterality: Right;    There were no vitals filed for this visit.  Subjective Assessment - 05/10/18 1455    Subjective  Pt states that she has been having increasing pain in her low back. She thinks that walking a little different today resulted in her Rt ankle being more sore. She did not notice much swelling at the time.     Currently in Pain?  No/denies   just sore like it's bruised        [facility] PT Assessment - 05/10/18 0001      Figure 8 Edema   Figure 8 - Right   48 cm    Figure 8 - Left   52 cm      AROM   Right Ankle Dorsiflexion  5   pain free   Right Ankle Plantar Flexion  65   pain Rt lateral ankle   Right Ankle Inversion  27   pain free   Right Ankle Eversion  15   pain Rt lateral ankle    Left Ankle Dorsiflexion  5    Left Ankle Plantar Flexion  65    Left Ankle Inversion  37    Left Ankle Eversion  17      Strength   Right Ankle Dorsiflexion  4+/5    Right Ankle Inversion  4+/5    Right Ankle Eversion  4/5   (+) pain   Left Ankle Dorsiflexion  5/5    Left Ankle Inversion  5/5    Left Ankle Eversion  5/5                   [facility] Adult PT Treatment/Exercise - 05/10/18 0001      Vasopneumatic   Number Minutes Vasopneumatic   15 minutes    Vasopnuematic Location   Ankle    Vasopneumatic Pressure  Medium    Vasopneumatic Temperature   32 degrees      Manual Therapy   Joint Mobilization  Rt proximal fibular AP mobs grade III-IV x2 bouts; Rt AP talocrural joint mobs grade III-IV x2 bouts     Soft tissue mobilization  STM Rt peroneals/lateral gastroc              PT Education - 05/10/18 1532    Education Details  progress towards goals    Person(s) Educated  Patient    Methods  Explanation    Comprehension  Verbalized understanding       PT Short Term Goals - 05/10/18 1501      PT SHORT TERM GOAL #1   Title   be independent in  initial HEP    Status  Achieved      PT SHORT TERM GOAL #2   Title  report a 25% reduction in right ankle pain  with standing and walking    Time  4    Period  Weeks    Status  Partially Met      PT SHORT TERM GOAL #3   Title  The patient will have improved ankle dorsiflexion to 3 degrees, inversion to 33 degrees and eversion to 20 degrees  needed for walking and climbing stairs.      Baseline  5 deg of dorsiflexion     Time  4    Period  Weeks    Status  Partially Met      PT SHORT TERM GOAL #4   Title  Decreased figure 8 girth measurement to 51 1/2 indicating decreased swelling    Baseline  Decreased 3cm     Time  4    Period  Weeks    Status  Achieved        PT Long Term Goals - 04/10/18 1938      PT LONG TERM GOAL #1   Title  be independent in advanced HEP    Time  8    Period  Weeks    Status  New    Target Date  06/05/18      PT LONG TERM GOAL #2   Title  reduce FOTO to < or = 42% limitation    Time  8    Period  Weeks    Status  New      PT LONG TERM GOAL #3   Title  The patient will have improved ankle dorsiflexion to 5 degrees and inversion to 35 degrees, eversion to 25 degrees needed for ambulation, stairs    Time  8    Period  Weeks    Status  New      PT LONG TERM GOAL #4   Title  Right ankle strength grossly 4-/5 needed for walking and standing longer periods of time    Time  8    Period  Weeks    Status  New      PT LONG TERM GOAL #5   Title  The patient will report a 50% improvement in pain with walking 1/4 to 1/2 mile.      Time  8    Period  Weeks    Status  New            Plan - 05/10/18 1536    Clinical Impression Statement  Pt has made progress towards her goals, having met atleast 2 of her goals for ROM and swelling improvement. She did arrive with LLE swelling, however RLE swelling was nearly resolved after wearing an ankle brace at work. Her ankle strength has increased to greater than 4/5 MMT with pain still  noted with inversion/eversion directions. Due to a busy day at work, pt requested focus on manual therapy to the RLE. She reported no increase in pain end of today's session. She would continue to benefit from skilled PT to address remaining limitations in ankle ROM, strength and proprioception.     Rehab Potential  Good    Clinical Impairments Affecting Rehab Potential  osteopenia;  stenosis;  right TKR    PT Frequency  2x / week    PT Duration  8 weeks    PT Treatment/Interventions  ADLs/Self Care Home Management;Cryotherapy;Electrical Stimulation;Moist Heat;Iontophoresis 74m/ml Dexamethasone;Ultrasound;Therapeutic  exercise;Therapeutic activities;Neuromuscular re-education;Patient/family education;Manual techniques;Dry needling;Taping;Vasopneumatic Device    PT Next Visit Plan  ankle proprioception; ankle strengthening; manual as needed; vasocompression     Consulted and Agree with Plan of Care  Patient       Patient will benefit from skilled therapeutic intervention in order to improve the following deficits and impairments:  Pain, Increased fascial restricitons, Decreased range of motion, Decreased strength, Impaired perceived functional ability, Difficulty walking  Visit Diagnosis: Pain in right ankle and joints of right foot  Stiffness of right ankle, not elsewhere classified  Muscle weakness (generalized)  Localized edema     Problem List Patient Active Problem List   Diagnosis Date Noted  . Other fatigue 07/18/2017  . Shortness of breath on exertion 07/18/2017  . Type 2 diabetes mellitus with retinopathy, with long-term current use of insulin ([place]) 07/18/2017  . Vitamin D deficiency 07/18/2017  . B12 nutritional deficiency 07/18/2017  . Other specified hypothyroidism 07/18/2017  . Acute pain of right knee 12/22/2016  . Trigger index finger of left hand 09/22/2016  . 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 Diabetes ([place]) 01/08/2014  . Essential hypertension 01/08/2014  . Hyperlipidemia 01/08/2014  . Postoperative anemia due to acute blood loss 11/22/2012  . Hyponatremia 11/06/2012  . OA (osteoarthritis) of knee 11/05/2012  . Villonodular synovitis of knee 04/20/2011    3:43 PM,05/10/18 Sherol Dade PT, DPT [place] [facility] [address], [place], [place], [address] Phone: [phone]   Fax:  [phone]  Name: [name] MRN: [id number] Date of Birth: May 31, 1950

## 2018-05-11 MED FILL — TRULICITY 1.5 MG/0.5 ML PEN: 1.5 | 28 days supply | Qty: 2 | Fill #1

## 2018-05-14 ENCOUNTER — Ambulatory Visit: Payer: 59 | Admitting: Cardiovascular Disease

## 2018-05-14 ENCOUNTER — Encounter: Payer: Self-pay | Admitting: Cardiovascular Disease

## 2018-05-14 VITALS — BP 142/66 | HR 71 | Ht 61.0 in | Wt 263.0 lb

## 2018-05-14 DIAGNOSIS — I480 Paroxysmal atrial fibrillation: Secondary | ICD-10-CM

## 2018-05-14 DIAGNOSIS — I10 Essential (primary) hypertension: Secondary | ICD-10-CM

## 2018-05-14 DIAGNOSIS — E785 Hyperlipidemia, unspecified: Secondary | ICD-10-CM

## 2018-05-14 MED ORDER — RIVAROXABAN 20 MG PO TABS: ORAL_TABLET | ORAL | 3 refills | Status: DC

## 2018-05-14 MED FILL — VALSARTAN 320 MG TAB: 320 | 90 days supply | Qty: 90 | Fill #1

## 2018-05-14 MED FILL — FREESTYLE LITE TEST STRIP: 90 days supply | Qty: 300 | Fill #1

## 2018-05-14 MED FILL — AMOXICILLIN 500 MG CAPSULE: 500 | 5 days supply | Qty: 20 | Fill #0

## 2018-05-14 MED FILL — INDOMETHACIN 50 MG CAPSULE: 50 | 6 days supply | Qty: 20 | Fill #0

## 2018-05-14 MED FILL — ATORVASTATIN 40 MG TABLET: 40 | 90 days supply | Qty: 90 | Fill #3

## 2018-05-14 NOTE — Patient Instructions (Addendum)
Medication Instructions:   If you need a refill on your cardiac medications before your next appointment, please call your pharmacy.   Lab work:  If you have labs (blood work) drawn today and your tests are completely normal, you will receive your results only by: . MyChart Message (if you have MyChart) OR . A paper copy in the mail If you have any lab test that is abnormal or we need to change your treatment, we will call you to review the results.  Testing/Procedures: None ordered today.  Follow-Up: At [facility], you and your health needs are our priority.  As part of our continuing mission to provide you with exceptional heart care, we have created designated Provider Care Teams.  These Care Teams include your primary Cardiologist (physician) and Advanced Practice Providers (APPs -  Physician Assistants and Nurse Practitioners) who all work together to provide you with the care you need, when you need it. You will need a follow up appointment in 12 months.  Please call our office 2 months in advance to schedule this appointment.  You may see Peter Nishan, MD or one of the following Advanced Practice Providers on your designated Care Team:   Lori Gerhardt, NP Laura Ingold, NP . Jill McDaniel, NP   

## 2018-05-15 ENCOUNTER — Ambulatory Visit: Payer: 59 | Attending: Orthopaedic Surgery | Admitting: Physical Therapy

## 2018-05-15 ENCOUNTER — Encounter: Payer: Self-pay | Admitting: Physical Therapy

## 2018-05-15 DIAGNOSIS — R600 Localized edema: Secondary | ICD-10-CM | POA: Insufficient documentation

## 2018-05-15 DIAGNOSIS — M25571 Pain in right ankle and joints of right foot: Secondary | ICD-10-CM | POA: Diagnosis not present

## 2018-05-15 DIAGNOSIS — M25671 Stiffness of right ankle, not elsewhere classified: Secondary | ICD-10-CM | POA: Diagnosis not present

## 2018-05-15 DIAGNOSIS — M6281 Muscle weakness (generalized): Secondary | ICD-10-CM | POA: Insufficient documentation

## 2018-05-15 NOTE — Therapy (Signed)
[facility] [address], [place], [place], [address] Phone: [phone]   Fax:  [phone]  Physical Therapy Treatment  Patient Details  Name: [name] MRN: [id number] Date of Birth: [birth year] Referring Provider (PT): Dr. Rhona Raider   Encounter Date: 05/15/2018  PT End of Session - 05/15/18 1446    Visit Number  10    Date for PT Re-Evaluation  06/05/18    Authorization Type  UMR    PT Start Time  1446    PT Stop Time  1530    PT Time Calculation (min)  44 min    Activity Tolerance  Patient tolerated treatment well       Past Medical History:  Diagnosis Date  . A-fib ([place])   . Anemia    hx of  . Arthritis   . Chest pain   . Constipation   . Depression   . Diabetes mellitus ORAL MEDS  . Diabetic retinopathy ([place])   . Dyspnea    with minimal exertion-deconditioned  . Dyspnea   . Dysrhythmia    a-fib  . Food allergy   . GERD (gastroesophageal reflux disease)    occasional  . Gout   . History of kidney stones   . Hypercholesteremia   . Hyperlipidemia   . Hypertension   . Hypertensive kidney disease   . Hypothyroidism   . Knee pain, right   . 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 Obesity   . OSA on CPAP    uses CPAP nightly  . Osteoarthritis   . Palpitations   . Pinched nerve in neck   . Pleurisy   . Pneumonia    hx of  . PONV (postoperative nausea and vomiting)    for 3-4 days after general anesthesia  . Sciatica   . Swelling of knee joint, right   . Synovitis of knee RIGHT  . Trigger finger, left    left index  . Vitamin D deficiency     Past Surgical History:  Procedure Laterality Date  . CESAREAN SECTION  X3  . KNEE ARTHROSCOPY  04/20/2011   Procedure: ARTHROSCOPY KNEE;  Surgeon: Gearlean Alf, MD;  Location: [facility];  Service: Orthopedics;  Laterality: Right;  WITH SYNOVECTOMY  . LEFT CARPAL TUNNEL / LEFT MIDDLE & RING FINGER TRIGGER RELEASE   08-26-2008  . LEFT SHOULDER ARTHROSCOPY W/ DEBRIDEMENT  09-09-2003  . LEFT SHOULDER ARTHROSCOPY/ LEFT THUMB TRIGGER RELEASE  02-22-2005  . PHOTOCOAGULATION WITH LASER Right 03/20/2018   Procedure: PHOTOCOAGULATION WITH LASER;  Surgeon: Jalene Mullet, MD;  Location: [place];  Service: Ophthalmology;  Laterality: Right;  . PULLEY RELEASE LEFT LONG FINGER  07-14-2009  . RIGHT CARPAL TUNNEL/ RIGHT THUMB TRIGGER RELEASE'S  11-28-2006  . RIGHT SHOULDER ARTHROSCOPY W/ ROTATOR CUFF REPAIR  01-13-2004  . SHOULDER ARTHROSCOPY DISTAL CLAVICLE EXCISION AND OPEN ROTATOR CUFF REPAIR  09-07-2004   LEFT  . SHOULDER ARTHROSCOPY W/ ACROMIAL REPAIR  11-29-2005   LEFT  . TONSILLECTOMY AND ADENOIDECTOMY  child  . TOTAL KNEE ARTHROPLASTY  12-14-2009   RIGHT  . TOTAL KNEE ARTHROPLASTY Left 11/05/2012   Procedure: LEFT TOTAL KNEE ARTHROPLASTY;  Surgeon: Gearlean Alf, MD;  [facility];  Service: Orthopedics;  Laterality: Left;  . TOTAL KNEE REVISION Right 07/29/2016   Procedure: RIGHT KNEE POLY-LINER EXCHANGE;  Surgeon: Mcarthur Rossetti, MD;  [facility];  Service: Orthopedics;  Laterality: Right;  . TRIGGER FINGER  RELEASE Right 02/21/2013   Procedure: RIGHT RING A-1 PULLEY RELEASE    (MINOR PROCEDURE) ;  Surgeon: Cammie Sickle., MD;  Location[facility];  Service: Orthopedics;  Laterality: Right;  . TRIGGER FINGER RELEASE Left 09/22/2016   Procedure: RELEASE TRIGGER FINGER LEFT INDEX FINGER;  Surgeon: Mcarthur Rossetti, MD;  Location: [place];  Service: Orthopedics;  Laterality: Left;  . TRIGGER FINGER RELEASE Right 01/20/2017   Procedure: RELEASE TRIGGER FINGER/A-1 PULLEY RIGHT INDEX FINGER;  Surgeon: Melrose Nakayama, MD;  Location: [place];  Service: Orthopedics;  Laterality: Right;  . VITRECTOMY 25 GAUGE WITH SCLERAL BUCKLE Right 03/20/2018   Procedure: RIGHT EYE VITRECTOMY WITH  ENDOLASER PARENTAL PHOTOCOAGULATION 25 GAUGE;  Surgeon: Jalene Mullet, MD;   Location: [place];  Service: Ophthalmology;  Laterality: Right;    There were no vitals filed for this visit.  Subjective Assessment - 05/15/18 1446    Subjective  Feeling pretty good.  When I got up this morning there was no pain.  My back pain is better too.  I feel like it's getting better.   Puffs up some but less swelling overall.      Pertinent History  Eye surgery 3 weeks ago vitreal hemorrhage going to have injection tomorrow, no jumping/running, no pool;  steroid drops in eyes;  recent bone density shows moderate osteopenia; lost 27#;  low back problems waiting to have ESI for spinal stenosis;  right TKR some pain/weakness    Currently in Pain?  Yes    Pain Score  1     Pain Location  Ankle    Pain Orientation  Right    Pain Type  Chronic pain         [facility] PT Assessment - 05/15/18 0001      Observation/Other Assessments   Focus on Therapeutic Outcomes (FOTO)   34% limitation       Figure 8 Edema   Figure 8 - Right   48 cm    Figure 8 - Left   52 cm      AROM   Right Ankle Dorsiflexion  5   pain free   Right Ankle Plantar Flexion  65   pain Rt lateral ankle   Right Ankle Inversion  27   pain free   Right Ankle Eversion  15   pain Rt lateral ankle      Strength   Right Ankle Dorsiflexion  4+/5    Right Ankle Inversion  4+/5    Right Ankle Eversion  4/5   (+) pain                  [facility] Adult PT Treatment/Exercise - 05/15/18 0001      Vasopneumatic   Number Minutes Vasopneumatic   --    Vasopnuematic Location   --    Vasopneumatic Pressure  --    Vasopneumatic Temperature   --      Manual Therapy   Joint Mobilization  Rt proximal fibular AP mobs grade III-IV x2 bouts; Rt AP talocrural joint mobs grade III-IV x2 bouts     Soft tissue mobilization  instrument assisted fanning sweeping lateral leg and around lateral malleolus fanning/sweeping       Ankle Exercises: Supine   Isometrics  Isometric inversion with ball squeeze 15x ; with ankle DF/PF  15x    T-Band  red band around feet for isometric eversion 15x, red band with ankle DF/PF 10x    Other  Supine Ankle Exercises  green band DF 20x therapist holding     Other Supine Ankle Exercises  green band PF with elevation 20x                PT Short Term Goals - 05/10/18 1501      PT SHORT TERM GOAL #1   Title  be independent in initial HEP    Status  Achieved      PT SHORT TERM GOAL #2   Title  report a 25% reduction in right ankle pain  with standing and walking    Time  4    Period  Weeks    Status  Partially Met      PT SHORT TERM GOAL #3   Title  The patient will have improved ankle dorsiflexion to 3 degrees, inversion to 33 degrees and eversion to 20 degrees  needed for walking and climbing stairs.      Baseline  5 deg of dorsiflexion     Time  4    Period  Weeks    Status  Partially Met      PT SHORT TERM GOAL #4   Title  Decreased figure 8 girth measurement to 51 1/2 indicating decreased swelling    Baseline  Decreased 3cm     Time  4    Period  Weeks    Status  Achieved        PT Long Term Goals - 04/10/18 1938      PT LONG TERM GOAL #1   Title  be independent in advanced HEP    Time  8    Period  Weeks    Status  New    Target Date  06/05/18      PT LONG TERM GOAL #2   Title  reduce FOTO to < or = 42% limitation    Time  8    Period  Weeks    Status  New      PT LONG TERM GOAL #3   Title  The patient will have improved ankle dorsiflexion to 5 degrees and inversion to 35 degrees, eversion to 25 degrees needed for ambulation, stairs    Time  8    Period  Weeks    Status  New      PT LONG TERM GOAL #4   Title  Right ankle strength grossly 4-/5 needed for walking and standing longer periods of time    Time  8    Period  Weeks    Status  New      PT LONG TERM GOAL #5   Title  The patient will report a 50% improvement in pain with walking 1/4 to 1/2 mile.      Time  8    Period  Weeks    Status  New            Plan - 05/15/18  1519    Clinical Impression Statement  The patient reports decreased pain and swelling recently.  Improving ROM especially ankle dorsiflexion.  Improving ankle strength to grossly 4+/5.  Her FOTO functional outcome has improved from 46% limitation to 34% limitation.   She declines the need for vasocompression today.  Therapist closely monitoring response with all interventions.      Rehab Potential  Good    Clinical Impairments Affecting Rehab Potential  osteopenia;  stenosis;  right TKR    PT Frequency  2x / week    PT Duration  8 weeks    PT Treatment/Interventions  ADLs/Self Care Home Management;Cryotherapy;Electrical Stimulation;Moist Heat;Iontophoresis 65m/ml Dexamethasone;Ultrasound;Therapeutic exercise;Therapeutic activities;Neuromuscular re-education;Patient/family education;Manual techniques;Dry needling;Taping;Vasopneumatic Device    PT Next Visit Plan  ankle proprioception; ankle strengthening; manual as needed; vasocompression as needed;  see how MD appt went       Patient will benefit from skilled therapeutic intervention in order to improve the following deficits and impairments:  Pain, Increased fascial restricitons, Decreased range of motion, Decreased strength, Impaired perceived functional ability, Difficulty walking  Visit Diagnosis: Pain in right ankle and joints of right foot  Stiffness of right ankle, not elsewhere classified  Muscle weakness (generalized)  Localized edema     Problem List Patient Active Problem List   Diagnosis Date Noted  . Other fatigue 07/18/2017  . Shortness of breath on exertion 07/18/2017  . Type 2 diabetes mellitus with retinopathy, with long-term current use of insulin ([place] 07/18/2017  . Vitamin D deficiency 07/18/2017  . B12 nutritional deficiency 07/18/2017  . Other specified hypothyroidism 07/18/2017  . Acute pain of right knee 12/22/2016  . Trigger index finger of left hand 09/22/2016  . Polyethylene liner wear following total  knee arthroplasty requiring isolated polyethylene liner exchange ([place] 07/29/2016  . 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 Diabetes ([place] 01/08/2014  . Essential hypertension 01/08/2014  . Hyperlipidemia 01/08/2014  . Postoperative anemia due to acute blood loss 11/22/2012  . Hyponatremia 11/06/2012  . OA (osteoarthritis) of knee 11/05/2012  . Villonodular synovitis of knee 04/20/2011   SRuben Im PT 05/15/18 3:32 PM Phone: [phone]: [phone] Singh3/05/2018, 3:31 PM  [facility] [address] [place] [address]Phone: [phone]  Fax:  [phone] Name: BYICEL SHANNONMRN: [id number] of Birth: [birth year]

## 2018-05-16 DIAGNOSIS — M25571 Pain in right ankle and joints of right foot: Secondary | ICD-10-CM | POA: Diagnosis not present

## 2018-05-16 MED FILL — DICLOFENAC SODIUM 1 % GEL: 1 | 25 days supply | Qty: 100 | Fill #0

## 2018-05-17 ENCOUNTER — Encounter: Payer: Self-pay | Admitting: Physical Therapy

## 2018-05-17 ENCOUNTER — Ambulatory Visit: Payer: 59 | Admitting: Physical Therapy

## 2018-05-17 DIAGNOSIS — M25671 Stiffness of right ankle, not elsewhere classified: Secondary | ICD-10-CM

## 2018-05-17 DIAGNOSIS — R600 Localized edema: Secondary | ICD-10-CM | POA: Diagnosis not present

## 2018-05-17 DIAGNOSIS — M25571 Pain in right ankle and joints of right foot: Secondary | ICD-10-CM | POA: Diagnosis not present

## 2018-05-17 DIAGNOSIS — M6281 Muscle weakness (generalized): Secondary | ICD-10-CM

## 2018-05-17 NOTE — Therapy (Signed)
[facility] [address] [place], [place], [address] Phone: [phone]   Fax:  [phone]  Physical Therapy Treatment  Patient Details  Name: [name] MRN: [id number] Date of Birth: September 22, 1950 Referring Provider (PT): Dr. Rhona Raider   Encounter Date: 05/17/2018  PT End of Session - 05/17/18 1640    Visit Number  11    Date for PT Re-Evaluation  06/05/18    Authorization Type  UMR    PT Start Time  1446    PT Stop Time  1530    PT Time Calculation (min)  44 min    Activity Tolerance  Patient tolerated treatment well       Past Medical History:  Diagnosis Date  . A-fib ([place])   . Anemia    hx of  . Arthritis   . Chest pain   . Constipation   . Depression   . Diabetes mellitus ORAL MEDS  . Diabetic retinopathy ([place])   . Dyspnea    with minimal exertion-deconditioned  . Dyspnea   . Dysrhythmia    a-fib  . Food allergy   . GERD (gastroesophageal reflux disease)    occasional  . Gout   . History of kidney stones   . Hypercholesteremia   . Hyperlipidemia   . Hypertension   . Hypertensive kidney disease   . Hypothyroidism   . Knee pain, right   . 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 Obesity   . OSA on CPAP    uses CPAP nightly  . Osteoarthritis   . Palpitations   . Pinched nerve in neck   . Pleurisy   . Pneumonia    hx of  . PONV (postoperative nausea and vomiting)    for 3-4 days after general anesthesia  . Sciatica   . Swelling of knee joint, right   . Synovitis of knee RIGHT  . Trigger finger, left    left index  . Vitamin D deficiency     Past Surgical History:  Procedure Laterality Date  . CESAREAN SECTION  X3  . KNEE ARTHROSCOPY  04/20/2011   Procedure: ARTHROSCOPY KNEE;  Surgeon: Gearlean Alf, MD;  Location: [facility];  Service: Orthopedics;  Laterality: Right;  WITH SYNOVECTOMY  . LEFT CARPAL TUNNEL / LEFT MIDDLE & RING FINGER TRIGGER RELEASE   08-26-2008  . LEFT SHOULDER ARTHROSCOPY W/ DEBRIDEMENT  09-09-2003  . LEFT SHOULDER ARTHROSCOPY/ LEFT THUMB TRIGGER RELEASE  02-22-2005  . PHOTOCOAGULATION WITH LASER Right 03/20/2018   Procedure: PHOTOCOAGULATION WITH LASER;  Surgeon: Jalene Mullet, MD;  Location: [place];  Service: Ophthalmology;  Laterality: Right;  . PULLEY RELEASE LEFT LONG FINGER  07-14-2009  . RIGHT CARPAL TUNNEL/ RIGHT THUMB TRIGGER RELEASE'S  11-28-2006  . RIGHT SHOULDER ARTHROSCOPY W/ ROTATOR CUFF REPAIR  01-13-2004  . SHOULDER ARTHROSCOPY DISTAL CLAVICLE EXCISION AND OPEN ROTATOR CUFF REPAIR  09-07-2004   LEFT  . SHOULDER ARTHROSCOPY W/ ACROMIAL REPAIR  11-29-2005   LEFT  . TONSILLECTOMY AND ADENOIDECTOMY  child  . TOTAL KNEE ARTHROPLASTY  12-14-2009   RIGHT  . TOTAL KNEE ARTHROPLASTY Left 11/05/2012   Procedure: LEFT TOTAL KNEE ARTHROPLASTY;  Surgeon: Gearlean Alf, MD;  Location: [facility];  Service: Orthopedics;  Laterality: Left;  . TOTAL KNEE REVISION Right 07/29/2016   Procedure: RIGHT KNEE POLY-LINER EXCHANGE;  Surgeon: Mcarthur Rossetti, MD;  Location: [facility];  Service: Orthopedics;  Laterality: Right;  . TRIGGER FINGER  RELEASE Right 02/21/2013   Procedure: RIGHT RING A-1 PULLEY RELEASE    (MINOR PROCEDURE) ;  Surgeon: Cammie Sickle., MD;  Location: [facility];  Service: Orthopedics;  Laterality: Right;  . TRIGGER FINGER RELEASE Left 09/22/2016   Procedure: RELEASE TRIGGER FINGER LEFT INDEX FINGER;  Surgeon: Mcarthur Rossetti, MD;  Location: [place];  Service: Orthopedics;  Laterality: Left;  . TRIGGER FINGER RELEASE Right 01/20/2017   Procedure: RELEASE TRIGGER FINGER/A-1 PULLEY RIGHT INDEX FINGER;  Surgeon: Melrose Nakayama, MD;  Location: [place];  Service: Orthopedics;  Laterality: Right;  . VITRECTOMY 25 GAUGE WITH SCLERAL BUCKLE Right 03/20/2018   Procedure: RIGHT EYE VITRECTOMY WITH  ENDOLASER PARENTAL PHOTOCOAGULATION 25 GAUGE;  Surgeon: Jalene Mullet, MD;   Location: [place];  Service: Ophthalmology;  Laterality: Right;    There were no vitals filed for this visit.  Subjective Assessment - 05/17/18 1451    Subjective  My legs are hurting today.  The swelling has returned on the side of the ankle.  Saw the PA and MD yesterday.   Going to start Voltaren gel which helped my knee.   No follow up.    More sore today than Monday and Tuesday.  I think the instrument assisted soft tissue helps the most.      Currently in Pain?  Yes    Pain Score  3     Pain Location  Ankle    Pain Orientation  Right    Pain Type  Chronic pain                       [facility] Adult PT Treatment/Exercise - 05/17/18 0001      Self-Care   Self-Care  Heat/Ice Application;Other Self-Care Comments    Heat/Ice Application  use of heat/cold alternating    Other Self-Care Comments   use of sleeve for edema control      Manual Therapy   Manual therapy comments  peroneus longus and brevis, anterior tibialis     Soft tissue mobilization  instrument assisted fanning sweeping lateral leg and around lateral malleolus fanning/sweeping       Ankle Exercises: Supine   Other Supine Ankle Exercises  plantarflexion with inversion 20x      Ankle Exercises: Seated   Other Seated Ankle Exercises  instruction in peroneal stretching with PF with hand assisted foot inversion 30 sec holds 5x     Other Seated Ankle Exercises  review of HEP and discussion of return to aquatic ex when released by eye doctor        Trigger Point Dry Needling - 05/17/18 0001    Consent Given?  Yes    Education Handout Provided  Previously provided    Dry Needling Comments  right side    Peroneals Response  Palpable increased muscle length    Anterior tibialis Response  Palpable increased muscle length             PT Short Term Goals - 05/10/18 1501      PT SHORT TERM GOAL #1   Title  be independent in initial HEP    Status  Achieved      PT SHORT TERM GOAL #2   Title  report a  25% reduction in right ankle pain  with standing and walking    Time  4    Period  Weeks    Status  Partially Met      PT SHORT  TERM GOAL #3   Title  The patient will have improved ankle dorsiflexion to 3 degrees, inversion to 33 degrees and eversion to 20 degrees  needed for walking and climbing stairs.      Baseline  5 deg of dorsiflexion     Time  4    Period  Weeks    Status  Partially Met      PT SHORT TERM GOAL #4   Title  Decreased figure 8 girth measurement to 51 1/2 indicating decreased swelling    Baseline  Decreased 3cm     Time  4    Period  Weeks    Status  Achieved        PT Long Term Goals - 04/10/18 1938      PT LONG TERM GOAL #1   Title  be independent in advanced HEP    Time  8    Period  Weeks    Status  New    Target Date  06/05/18      PT LONG TERM GOAL #2   Title  reduce FOTO to < or = 42% limitation    Time  8    Period  Weeks    Status  New      PT LONG TERM GOAL #3   Title  The patient will have improved ankle dorsiflexion to 5 degrees and inversion to 35 degrees, eversion to 25 degrees needed for ambulation, stairs    Time  8    Period  Weeks    Status  New      PT LONG TERM GOAL #4   Title  Right ankle strength grossly 4-/5 needed for walking and standing longer periods of time    Time  8    Period  Weeks    Status  New      PT LONG TERM GOAL #5   Title  The patient will report a 50% improvement in pain with walking 1/4 to 1/2 mile.      Time  8    Period  Weeks    Status  New            Plan - 05/17/18 1640    Clinical Impression Statement  The patient reports variable good days/bad days with pain and swelling in right lateral ankle.  She has multiple tender points in peroneus longus and brevis as well as anterior tibialis.  Improved muscle length following soft tissue mobilization, myofascial release and dry needling.  Discussed possible return to aquatic ex next week and appropriate ex,  depending on the OK from her eye  doctor.  She declines the need for vasocompression in the clinic opting to ice at home.  Therapist closely monitoring response with all treatment interventions.  Anticipate readiness for discharge in 2 visits to independent HEP.      Rehab Potential  Good    Clinical Impairments Affecting Rehab Potential  osteopenia;  stenosis;  right TKR    PT Frequency  2x / week    PT Duration  8 weeks    PT Treatment/Interventions  ADLs/Self Care Home Management;Cryotherapy;Electrical Stimulation;Moist Heat;Iontophoresis '4mg'$ /ml Dexamethasone;Ultrasound;Therapeutic exercise;Therapeutic activities;Neuromuscular re-education;Patient/family education;Manual techniques;Dry needling;Taping;Vasopneumatic Device    PT Next Visit Plan  assess response to DN;  ankle proprioception; ankle strengthening; manual as needed; vasocompression as needed;  discharge in 2 visits;  try McConnell tape as needed for pain control        Patient will benefit from skilled therapeutic intervention in  order to improve the following deficits and impairments:  Pain, Increased fascial restricitons, Decreased range of motion, Decreased strength, Impaired perceived functional ability, Difficulty walking  Visit Diagnosis: Pain in right ankle and joints of right foot  Stiffness of right ankle, not elsewhere classified  Muscle weakness (generalized)  Localized edema     Problem List Patient Active Problem List   Diagnosis Date Noted  . Other fatigue 07/18/2017  . Shortness of breath on exertion 07/18/2017  . Type 2 diabetes mellitus with retinopathy, with long-term current use of insulin ([place]) 07/18/2017  . Vitamin D deficiency 07/18/2017  . B12 nutritional deficiency 07/18/2017  . Other specified hypothyroidism 07/18/2017  . Acute pain of right knee 12/22/2016  . Trigger index finger of left hand 09/22/2016  . 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 Diabetes ([place]) 01/08/2014  . Essential hypertension 01/08/2014  . Hyperlipidemia 01/08/2014  . Postoperative anemia due to acute blood loss 11/22/2012  . Hyponatremia 11/06/2012  . OA (osteoarthritis) of knee 11/05/2012  . Villonodular synovitis of knee 04/20/2011   Ruben Im, PT 05/17/18 4:49 PM Phone: [phone] Fax: [phone]  Alvera Singh 05/17/2018, 4:48 PM  [facility] [address] [place], [place], [address] Phone: [phone]   Fax:  [phone]  Name: [name] MRN: [id number] Date of Birth: 05-06-1950

## 2018-05-21 DIAGNOSIS — E113511 Type 2 diabetes mellitus with proliferative diabetic retinopathy with macular edema, right eye: Secondary | ICD-10-CM | POA: Diagnosis not present

## 2018-05-21 MED FILL — XARELTO 20 MG TABLET: 20 | 90 days supply | Qty: 90 | Fill #0

## 2018-05-22 ENCOUNTER — Ambulatory Visit: Payer: 59 | Admitting: Physical Therapy

## 2018-05-22 ENCOUNTER — Encounter: Payer: Self-pay | Admitting: Physical Therapy

## 2018-05-22 DIAGNOSIS — M6281 Muscle weakness (generalized): Secondary | ICD-10-CM | POA: Diagnosis not present

## 2018-05-22 DIAGNOSIS — R600 Localized edema: Secondary | ICD-10-CM

## 2018-05-22 DIAGNOSIS — M25671 Stiffness of right ankle, not elsewhere classified: Secondary | ICD-10-CM

## 2018-05-22 DIAGNOSIS — M25571 Pain in right ankle and joints of right foot: Secondary | ICD-10-CM

## 2018-05-22 NOTE — Therapy (Signed)
[facility] [address] [place], [place], [address] Phone: [phone]   Fax:  [phone]  Physical Therapy Treatment  Patient Details  Name: [name] MRN: [id number] Date of Birth: 11-17-1950 Referring Provider (PT): Dr. Rhona Raider   Encounter Date: 05/22/2018  PT End of Session - 05/22/18 1731    Visit Number  12    Date for PT Re-Evaluation  06/05/18    Authorization Type  UMR    PT Start Time  1450    PT Stop Time  1529   DN   PT Time Calculation (min)  39 min    Activity Tolerance  Patient tolerated treatment well       Past Medical History:  Diagnosis Date  . A-fib ([place])   . Anemia    hx of  . Arthritis   . Chest pain   . Constipation   . Depression   . Diabetes mellitus ORAL MEDS  . Diabetic retinopathy ([place])   . Dyspnea    with minimal exertion-deconditioned  . Dyspnea   . Dysrhythmia    a-fib  . Food allergy   . GERD (gastroesophageal reflux disease)    occasional  . Gout   . History of kidney stones   . Hypercholesteremia   . Hyperlipidemia   . Hypertension   . Hypertensive kidney disease   . Hypothyroidism   . Knee pain, right   . 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 Obesity   . OSA on CPAP    uses CPAP nightly  . Osteoarthritis   . Palpitations   . Pinched nerve in neck   . Pleurisy   . Pneumonia    hx of  . PONV (postoperative nausea and vomiting)    for 3-4 days after general anesthesia  . Sciatica   . Swelling of knee joint, right   . Synovitis of knee RIGHT  . Trigger finger, left    left index  . Vitamin D deficiency     Past Surgical History:  Procedure Laterality Date  . CESAREAN SECTION  X3  . KNEE ARTHROSCOPY  04/20/2011   Procedure: ARTHROSCOPY KNEE;  Surgeon: Gearlean Alf, MD;  Location: [facility];  Service: Orthopedics;  Laterality: Right;  WITH SYNOVECTOMY  . LEFT CARPAL TUNNEL / LEFT MIDDLE & RING FINGER TRIGGER  RELEASE  08-26-2008  . LEFT SHOULDER ARTHROSCOPY W/ DEBRIDEMENT  09-09-2003  . LEFT SHOULDER ARTHROSCOPY/ LEFT THUMB TRIGGER RELEASE  02-22-2005  . PHOTOCOAGULATION WITH LASER Right 03/20/2018   Procedure: PHOTOCOAGULATION WITH LASER;  Surgeon: Jalene Mullet, MD;  Location: [place];  Service: Ophthalmology;  Laterality: Right;  . PULLEY RELEASE LEFT LONG FINGER  07-14-2009  . RIGHT CARPAL TUNNEL/ RIGHT THUMB TRIGGER RELEASE'S  11-28-2006  . RIGHT SHOULDER ARTHROSCOPY W/ ROTATOR CUFF REPAIR  01-13-2004  . SHOULDER ARTHROSCOPY DISTAL CLAVICLE EXCISION AND OPEN ROTATOR CUFF REPAIR  09-07-2004   LEFT  . SHOULDER ARTHROSCOPY W/ ACROMIAL REPAIR  11-29-2005   LEFT  . TONSILLECTOMY AND ADENOIDECTOMY  child  . TOTAL KNEE ARTHROPLASTY  12-14-2009   RIGHT  . TOTAL KNEE ARTHROPLASTY Left 11/05/2012   Procedure: LEFT TOTAL KNEE ARTHROPLASTY;  Surgeon: Gearlean Alf, MD;  Location[facility];  Service: Orthopedics;  Laterality: Left;  . TOTAL KNEE REVISION Right 07/29/2016   Procedure: RIGHT KNEE POLY-LINER EXCHANGE;  Surgeon: Mcarthur Rossetti, MD;  [facility];  Service: Orthopedics;  Laterality: Right;  .  TRIGGER FINGER RELEASE Right 02/21/2013   Procedure: RIGHT RING A-1 PULLEY RELEASE    (MINOR PROCEDURE) ;  Surgeon: Cammie Sickle., MD;  Location: [facility];  Service: Orthopedics;  Laterality: Right;  . TRIGGER FINGER RELEASE Left 09/22/2016   Procedure: RELEASE TRIGGER FINGER LEFT INDEX FINGER;  Surgeon: Mcarthur Rossetti, MD;  Location: [place];  Service: Orthopedics;  Laterality: Left;  . TRIGGER FINGER RELEASE Right 01/20/2017   Procedure: RELEASE TRIGGER FINGER/A-1 PULLEY RIGHT INDEX FINGER;  Surgeon: Melrose Nakayama, MD;  Location: [place];  Service: Orthopedics;  Laterality: Right;  . VITRECTOMY 25 GAUGE WITH SCLERAL BUCKLE Right 03/20/2018   Procedure: RIGHT EYE VITRECTOMY WITH  ENDOLASER PARENTAL PHOTOCOAGULATION 25 GAUGE;  Surgeon: Jalene Mullet, MD;  Location: [place];  Service: Ophthalmology;  Laterality: Right;    There were no vitals filed for this visit.  Subjective Assessment - 05/22/18 1445    Subjective  My leg is hurting me today.  The eye doctor injected my eye and said I could go back to the pool in a few days.   Rode the bike.  Decided not to use the Volatern gel b/c of heart risks.  I felt bruised after DN but it felt better the next day.      Pertinent History  Eye surgery 3 weeks ago vitreal hemorrhage going to have injection tomorrow, no jumping/running, no pool;  steroid drops in eyes;  recent bone density shows moderate osteopenia; lost 27#;  low back problems waiting to have ESI for spinal stenosis;  right TKR some pain/weakness    Currently in Pain?  Yes    Pain Location  Ankle    Pain Orientation  Right    Pain Type  Chronic pain                       [facility] Adult PT Treatment/Exercise - 05/22/18 0001      Self-Care   Other Self-Care Comments   anti-inflammatory "helpers" nutrition, sleep, muscle pumping, diaphragmatic breathing      Manual Therapy   Manual therapy comments  peroneus longus and brevis, anterior tibialis     Soft tissue mobilization  instrument assisted fanning sweeping lateral leg and around lateral malleolus fanning/sweeping        Trigger Point Dry Needling - 05/22/18 0001    Consent Given?  Yes    Peroneals Response  Palpable increased muscle length    Anterior tibialis Response  Palpable increased muscle length        Right only      PT Short Term Goals - 05/10/18 1501      PT SHORT TERM GOAL #1   Title  be independent in initial HEP    Status  Achieved      PT SHORT TERM GOAL #2   Title  report a 25% reduction in right ankle pain  with standing and walking    Time  4    Period  Weeks    Status  Partially Met      PT SHORT TERM GOAL #3   Title  The patient will have improved ankle dorsiflexion to 3 degrees, inversion to 33 degrees and eversion  to 20 degrees  needed for walking and climbing stairs.      Baseline  5 deg of dorsiflexion     Time  4    Period  Weeks    Status  Partially Met  PT SHORT TERM GOAL #4   Title  Decreased figure 8 girth measurement to 51 1/2 indicating decreased swelling    Baseline  Decreased 3cm     Time  4    Period  Weeks    Status  Achieved        PT Long Term Goals - 04/10/18 1938      PT LONG TERM GOAL #1   Title  be independent in advanced HEP    Time  8    Period  Weeks    Status  New    Target Date  06/05/18      PT LONG TERM GOAL #2   Title  reduce FOTO to < or = 42% limitation    Time  8    Period  Weeks    Status  New      PT LONG TERM GOAL #3   Title  The patient will have improved ankle dorsiflexion to 5 degrees and inversion to 35 degrees, eversion to 25 degrees needed for ambulation, stairs    Time  8    Period  Weeks    Status  New      PT LONG TERM GOAL #4   Title  Right ankle strength grossly 4-/5 needed for walking and standing longer periods of time    Time  8    Period  Weeks    Status  New      PT LONG TERM GOAL #5   Title  The patient will report a 50% improvement in pain with walking 1/4 to 1/2 mile.      Time  8    Period  Weeks    Status  New            Plan - 05/22/18 1732    Clinical Impression Statement  The patient reports variable improvements.  She reports a few good days, followed by a bad day.  Moderate lateral lower leg and ankle edema today.  Overall decreased tender point size and number in anterior tibialis muscle but several points present in peroneals.  Discussed return to water ex this weekend which would be helpful for other issues as well in back and knee.  Therapist closely monitoring response with all interventions.      Rehab Potential  Good    Clinical Impairments Affecting Rehab Potential  osteopenia;  stenosis;  right TKR    PT Frequency  2x / week    PT Duration  8 weeks    PT Treatment/Interventions  ADLs/Self Care  Home Management;Cryotherapy;Electrical Stimulation;Moist Heat;Iontophoresis 84m/ml Dexamethasone;Ultrasound;Therapeutic exercise;Therapeutic activities;Neuromuscular re-education;Patient/family education;Manual techniques;Dry needling;Taping;Vasopneumatic Device    PT Next Visit Plan  possible discharge next visit to independent HEP and pool program;  aquatic HEP if needed;  check progress toward goals       Patient will benefit from skilled therapeutic intervention in order to improve the following deficits and impairments:  Pain, Increased fascial restricitons, Decreased range of motion, Decreased strength, Impaired perceived functional ability, Difficulty walking  Visit Diagnosis: Pain in right ankle and joints of right foot  Stiffness of right ankle, not elsewhere classified  Muscle weakness (generalized)  Localized edema     Problem List Patient Active Problem List   Diagnosis Date Noted  . Other fatigue 07/18/2017  . Shortness of breath on exertion 07/18/2017  . Type 2 diabetes mellitus with retinopathy, with long-term current use of insulin ([place] 07/18/2017  . Vitamin D deficiency 07/18/2017  . B12 nutritional  deficiency 07/18/2017  . Other specified hypothyroidism 07/18/2017  . Acute pain of right knee 12/22/2016  . Trigger index finger of left hand 09/22/2016  . 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 Diabetes ([place]) 01/08/2014  . Essential hypertension 01/08/2014  . Hyperlipidemia 01/08/2014  . Postoperative anemia due to acute blood loss 11/22/2012  . Hyponatremia 11/06/2012  . OA (osteoarthritis) of knee 11/05/2012  . Villonodular synovitis of knee 04/20/2011   Ruben Im, PT 05/22/18 5:38 PM Phone: [phone] Fax: [phone] Alvera Singh 05/22/2018, 5:38 PM  [facility] [address], [place], [place], [address] Phone: [phone]   Fax:  [phone]  Name: [name] MRN: [id number] Date of Birth: 05-Feb-1951

## 2018-05-23 ENCOUNTER — Ambulatory Visit (INDEPENDENT_AMBULATORY_CARE_PROVIDER_SITE_OTHER): Payer: 59 | Admitting: Family Medicine

## 2018-05-24 ENCOUNTER — Ambulatory Visit: Payer: 59 | Admitting: Physical Therapy

## 2018-05-24 ENCOUNTER — Encounter: Payer: Self-pay | Admitting: Physical Therapy

## 2018-05-24 ENCOUNTER — Other Ambulatory Visit: Payer: Self-pay

## 2018-05-24 DIAGNOSIS — R600 Localized edema: Secondary | ICD-10-CM | POA: Diagnosis not present

## 2018-05-24 DIAGNOSIS — M6281 Muscle weakness (generalized): Secondary | ICD-10-CM | POA: Diagnosis not present

## 2018-05-24 DIAGNOSIS — M25571 Pain in right ankle and joints of right foot: Secondary | ICD-10-CM

## 2018-05-24 DIAGNOSIS — M25671 Stiffness of right ankle, not elsewhere classified: Secondary | ICD-10-CM | POA: Diagnosis not present

## 2018-05-24 NOTE — Therapy (Signed)
[facility] [address] [place], [place], [address] Phone: [phone]   Fax:  [phone]  Physical Therapy Treatment/Discharge Summary   Patient Details  Name: [name] MRN: [id number] Date of Birth: 08-Jun-1950 Referring Provider (PT): Dr. Rhona Raider   Encounter Date: 05/24/2018  PT End of Session - 05/24/18 1920    Visit Number  13    Date for PT Re-Evaluation  06/05/18    Authorization Type  [facility]    PT Start Time  1446    PT Stop Time  1529    PT Time Calculation (min)  43 min    Activity Tolerance  Patient tolerated treatment well       Past Medical History:  Diagnosis Date  . A-fib ([place])   . Anemia    hx of  . Arthritis   . Chest pain   . Constipation   . Depression   . Diabetes mellitus [facility]  . Diabetic retinopathy ([place])   . Dyspnea    with minimal exertion-deconditioned  . Dyspnea   . Dysrhythmia    a-fib  . Food allergy   . GERD (gastroesophageal reflux disease)    occasional  . Gout   . History of kidney stones   . Hypercholesteremia   . Hyperlipidemia   . Hypertension   . Hypertensive kidney disease   . Hypothyroidism   . Knee pain, right   . 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 Obesity   . OSA on CPAP    uses CPAP nightly  . Osteoarthritis   . Palpitations   . Pinched nerve in neck   . Pleurisy   . Pneumonia    hx of  . PONV (postoperative nausea and vomiting)    for 3-4 days after general anesthesia  . Sciatica   . Swelling of knee joint, right   . Synovitis of knee RIGHT  . Trigger finger, left    left index  . Vitamin D deficiency     Past Surgical History:  Procedure Laterality Date  . CESAREAN SECTION  X3  . KNEE ARTHROSCOPY  04/20/2011   Procedure: ARTHROSCOPY KNEE;  Surgeon: Gearlean Alf, MD;  Location: [facility];  Service: Orthopedics;  Laterality: Right;  WITH SYNOVECTOMY  . LEFT CARPAL TUNNEL / LEFT MIDDLE & RING  FINGER TRIGGER RELEASE  08-26-2008  . LEFT SHOULDER ARTHROSCOPY W/ DEBRIDEMENT  09-09-2003  . LEFT SHOULDER ARTHROSCOPY/ LEFT THUMB TRIGGER RELEASE  02-22-2005  . PHOTOCOAGULATION WITH LASER Right 03/20/2018   Procedure: PHOTOCOAGULATION WITH LASER;  Surgeon: Jalene Mullet, MD;  Location: [facility];  Service: Ophthalmology;  Laterality: Right;  . PULLEY RELEASE LEFT LONG FINGER  07-14-2009  . RIGHT CARPAL TUNNEL/ RIGHT THUMB TRIGGER RELEASE'S  11-28-2006  . RIGHT SHOULDER ARTHROSCOPY W/ ROTATOR CUFF REPAIR  01-13-2004  . SHOULDER ARTHROSCOPY DISTAL CLAVICLE EXCISION AND OPEN ROTATOR CUFF REPAIR  09-07-2004   LEFT  . SHOULDER ARTHROSCOPY W/ ACROMIAL REPAIR  11-29-2005   LEFT  . TONSILLECTOMY AND ADENOIDECTOMY  child  . TOTAL KNEE ARTHROPLASTY  12-14-2009   RIGHT  . TOTAL KNEE ARTHROPLASTY Left 11/05/2012   Procedure: LEFT TOTAL KNEE ARTHROPLASTY;  Surgeon: Gearlean Alf, MD;  Location: [facility];  Service: Orthopedics;  Laterality: Left;  . TOTAL KNEE REVISION Right 07/29/2016   Procedure: RIGHT KNEE POLY-LINER EXCHANGE;  Surgeon: Mcarthur Rossetti, MD;  Location: [facility];  Service: Orthopedics;  Laterality: Right;  .  TRIGGER FINGER RELEASE Right 02/21/2013   Procedure: RIGHT RING A-1 PULLEY RELEASE    (MINOR PROCEDURE) ;  Surgeon: Cammie Sickle., MD;  Location: [facility];  Service: Orthopedics;  Laterality: Right;  . TRIGGER FINGER RELEASE Left 09/22/2016   Procedure: RELEASE TRIGGER FINGER LEFT INDEX FINGER;  Surgeon: Mcarthur Rossetti, MD;  Location: [facility];  Service: Orthopedics;  Laterality: Left;  . TRIGGER FINGER RELEASE Right 01/20/2017   Procedure: RELEASE TRIGGER FINGER/A-1 PULLEY RIGHT INDEX FINGER;  Surgeon: Melrose Nakayama, MD;  Location: [facility];  Service: Orthopedics;  Laterality: Right;  . VITRECTOMY 25 GAUGE WITH SCLERAL BUCKLE Right 03/20/2018   Procedure: RIGHT EYE VITRECTOMY WITH  ENDOLASER PARENTAL PHOTOCOAGULATION 25 GAUGE;   Surgeon: Jalene Mullet, MD;  Location: [facility];  Service: Ophthalmology;  Laterality: Right;    There were no vitals filed for this visit.  Subjective Assessment - 05/24/18 1446    Subjective  Worked 12 hours yesterday.  Wore brace part of the day and after I took it off it started swelling.      Pertinent History  Eye surgery 3 weeks ago vitreal hemorrhage going to have injection tomorrow, no jumping/running, no pool;  steroid drops in eyes;  recent bone density shows moderate osteopenia; lost 27#;  low back problems waiting to have ESI for spinal stenosis;  right TKR some pain/weakness    How long can you walk comfortably?  1/2 mile     Currently in Pain?  Yes    Pain Score  3     Pain Location  Ankle    Pain Orientation  Right    Pain Type  Chronic pain         [facility] PT Assessment - 05/24/18 0001      Observation/Other Assessments   Focus on Therapeutic Outcomes (FOTO)   34% limitation       Figure 8 Edema   Figure 8 - Right   50 cm      AROM   Right Ankle Dorsiflexion  5    Right Ankle Plantar Flexion  70    Right Ankle Inversion  35    Right Ankle Eversion  12   pain     Strength   Right Ankle Dorsiflexion  4+/5    Right Ankle Plantar Flexion  4+/5    Right Ankle Inversion  4+/5    Right Ankle Eversion  4/5   (+) pain                  [facility] Adult PT Treatment/Exercise - 05/24/18 0001      Self-Care   Other Self-Care Comments   positioning at work with foot in slight dorsiflexion      Ankle Exercises: Seated   Other Seated Ankle Exercises  discussion of aquatic ex: start with forward walk, progress to side step, then later swimming    Other Seated Ankle Exercises  review of resisted ex's with band      Ankle Exercises: Stretches   Other Stretch  discussion of gastroc stretching on a consistent basis               PT Short Term Goals - 05/24/18 1515      PT SHORT TERM GOAL #1   Title  be independent in initial HEP    Status  Achieved       PT SHORT TERM GOAL #2   Title  report a 25% reduction in right  ankle pain  with standing and walking    Status  Achieved      PT SHORT TERM GOAL #3   Title  The patient will have improved ankle dorsiflexion to 3 degrees, inversion to 33 degrees and eversion to 20 degrees  needed for walking and climbing stairs.      Time  4    Period  Weeks    Status  Partially Met      PT SHORT TERM GOAL #4   Title  Decreased figure 8 girth measurement to 51 1/2 indicating decreased swelling    Status  Achieved        PT Long Term Goals - 05/24/18 1506      PT LONG TERM GOAL #1   Title  be independent in advanced HEP    Status  Achieved      PT LONG TERM GOAL #2   Title  reduce FOTO to < or = 42% limitation    Status  Achieved      PT LONG TERM GOAL #3   Title  The patient will have improved ankle dorsiflexion to 5 degrees and inversion to 35 degrees, eversion to 25 degrees needed for ambulation, stairs    Status  Partially Met      PT LONG TERM GOAL #4   Title  Right ankle strength grossly 4-/5 needed for walking and standing longer periods of time    Status  Achieved      PT LONG TERM GOAL #5   Title  The patient will report a 50% improvement in pain with walking 1/4 to 1/2 mile.      Status  Achieved            Plan - 05/24/18 1516    Clinical Impression Statement  Overall she reports her ankle is 75% better.   Her active ROM of the ankle is WFLs and no longer painful with DF, PF and Inversion.  She continues to be painful with ankle eversion.  Strength is grossly 4+/5, except eversion 4/5.  Her FOTO functional outcome score improved from 46% limitation to 34% indicating improved function with less pain.  She reports she is able to walk 1/2 fairly comfortably.  She plans to restart water ex next week.  She has been unable to do this usual ex b/c of recent eye issues.   Will discharge from PT with majority of goals met.         Patient will benefit from skilled therapeutic  intervention in order to improve the following deficits and impairments:  Pain, Increased fascial restricitons, Decreased range of motion, Decreased strength, Impaired perceived functional ability, Difficulty walking  Visit Diagnosis: Pain in right ankle and joints of right foot  Stiffness of right ankle, not elsewhere classified  Muscle weakness (generalized)  Localized edema     Problem List Patient Active Problem List   Diagnosis Date Noted  . Other fatigue 07/18/2017  . Shortness of breath on exertion 07/18/2017  . Type 2 diabetes mellitus with retinopathy, with long-term current use of insulin ([place]) 07/18/2017  . Vitamin D deficiency 07/18/2017  . B12 nutritional deficiency 07/18/2017  . Other specified hypothyroidism 07/18/2017  . Acute pain of right knee 12/22/2016  . Trigger index finger of left hand 09/22/2016  . 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 Diabetes ([place]) 01/08/2014  . Essential  hypertension 01/08/2014  . Hyperlipidemia 01/08/2014  . Postoperative anemia due to acute blood loss 11/22/2012  . Hyponatremia 11/06/2012  . OA (osteoarthritis) of knee 11/05/2012  . Villonodular synovitis of knee 04/20/2011   PHYSICAL THERAPY DISCHARGE SUMMARY  Visits from Start of Care: 13  Current functional level related to goals / functional outcomes: See clinical impressions above   Remaining deficits: As above   Education / Equipment: HEP Plan: Patient agrees to discharge.  Patient goals were partially met. Patient is being discharged due to meeting the stated rehab goals.  ?????     Ruben Im C 05/24/2018, 7:40 PM Ruben Im, PT 05/24/18 7:41 PM Phone: [phone] Fax: [facility] [address], [place], [place],  [address] Phone: [phone]   Fax:  [phone]  Name: VANIYAH LANSKY MRN: [id number] Date of Birth: 08-Aug-1950

## 2018-06-04 ENCOUNTER — Encounter (INDEPENDENT_AMBULATORY_CARE_PROVIDER_SITE_OTHER): Payer: Self-pay

## 2018-06-05 ENCOUNTER — Encounter (INDEPENDENT_AMBULATORY_CARE_PROVIDER_SITE_OTHER): Payer: Self-pay

## 2018-06-05 ENCOUNTER — Ambulatory Visit (INDEPENDENT_AMBULATORY_CARE_PROVIDER_SITE_OTHER): Payer: 59 | Admitting: Family Medicine

## 2018-06-06 MED FILL — DULOXETINE HCL 60 MG CPEP: 60 | 30 days supply | Qty: 30 | Fill #9

## 2018-06-06 MED FILL — TRULICITY 1.5 MG/0.5 ML PEN: 1.5 | 28 days supply | Qty: 2 | Fill #0

## 2018-06-07 ENCOUNTER — Other Ambulatory Visit: Payer: Self-pay | Admitting: Cardiovascular Disease

## 2018-06-08 MED FILL — METOPROLOL TARTRATE 25 MG T: 25 | 90 days supply | Qty: 180 | Fill #0

## 2018-06-12 ENCOUNTER — Telehealth: Payer: Self-pay | Admitting: Cardiovascular Disease

## 2018-06-12 MED ORDER — METOPROLOL TARTRATE 50 MG PO TABS: 50.0000 mg | ORAL_TABLET | Freq: Two times a day (BID) | ORAL | 3 refills | Status: DC

## 2018-06-12 MED FILL — METOPROLOL TARTRATE 50 MG T: 50 | 90 days supply | Qty: 180 | Fill #0

## 2018-06-12 NOTE — Telephone Encounter (Signed)
Called patient back about her message. Patient stated she has been taking Metoprolol 50 mg BID for as long as she can remember. In the chart patient was started on Metoprolol 25 mg BID back in 2018. Patient's Metoprolol was changed to 50 mg on 08/02/17, with no note to show reason for changes. On 03/15/18 dose was changed back to 25 mg BID with no note for changes. Patient's last BP on 05/14/18 at office visit was 142/66, HR 71. Patient stated she was on Metoprolol 50 mg at this time. Will see if Dr. Eden Emms is fine with patient to continue on Metoprolol 50 mg BID, instead of Metoprolol 25 mg BID.

## 2018-06-12 NOTE — Telephone Encounter (Signed)
50 bid is fine

## 2018-06-12 NOTE — Telephone Encounter (Signed)
New Message    Pt c/o medication issue:  1. Name of Medication: Metoprolol   2. How are you currently taking this medication (dosage and times per day)? 25mg  2xdaily   3. Are you having a reaction (difficulty breathing--STAT)? No  4. What is your medication issue? Pt is calling because she says her prescription is for 25 mg 2x daily when before she was taking 50mg  2x daily and she said she was unaware of them hanging the dosage   Please call

## 2018-06-12 NOTE — Telephone Encounter (Signed)
Called patient back about medication change. Patient agreed to change and will check her BP a couple of times a week to make sure it is being controlled.

## 2018-06-13 MED FILL — LEVOTHYROXINE 125 MCG TABLE: 125 | 30 days supply | Qty: 30 | Fill #1

## 2018-06-13 MED FILL — AMLODIPINE BESYLATE 5 MG TA: 5 | 90 days supply | Qty: 90 | Fill #1

## 2018-06-13 MED FILL — HYDROCHLOROTHIAZIDE 12.5 MG: 12.5 | 90 days supply | Qty: 90 | Fill #1

## 2018-06-16 ENCOUNTER — Encounter (HOSPITAL_COMMUNITY): Payer: Self-pay | Admitting: Emergency Medicine

## 2018-06-16 ENCOUNTER — Observation Stay (HOSPITAL_COMMUNITY)
Admission: EM | Admit: 2018-06-16 | Discharge: 2018-06-17 | Disposition: A | Discharge status: Home / Self Care | Payer: 59 | Attending: Orthopaedic Surgery | Admitting: Orthopaedic Surgery

## 2018-06-16 ENCOUNTER — Other Ambulatory Visit: Payer: Self-pay

## 2018-06-16 ENCOUNTER — Emergency Department (HOSPITAL_COMMUNITY): Payer: 59

## 2018-06-16 DIAGNOSIS — S4991XA Unspecified injury of right shoulder and upper arm, initial encounter: Secondary | ICD-10-CM | POA: Diagnosis not present

## 2018-06-16 DIAGNOSIS — Z87891 Personal history of nicotine dependence: Secondary | ICD-10-CM | POA: Insufficient documentation

## 2018-06-16 DIAGNOSIS — Z96653 Presence of artificial knee joint, bilateral: Secondary | ICD-10-CM | POA: Insufficient documentation

## 2018-06-16 DIAGNOSIS — Z794 Long term (current) use of insulin: Secondary | ICD-10-CM | POA: Diagnosis not present

## 2018-06-16 DIAGNOSIS — E039 Hypothyroidism, unspecified: Secondary | ICD-10-CM | POA: Diagnosis not present

## 2018-06-16 DIAGNOSIS — Z6841 Body Mass Index (BMI) 40.0 and over, adult: Secondary | ICD-10-CM | POA: Diagnosis not present

## 2018-06-16 DIAGNOSIS — S42409A Unspecified fracture of lower end of unspecified humerus, initial encounter for closed fracture: Secondary | ICD-10-CM | POA: Diagnosis present

## 2018-06-16 DIAGNOSIS — E785 Hyperlipidemia, unspecified: Secondary | ICD-10-CM | POA: Diagnosis not present

## 2018-06-16 DIAGNOSIS — Z7901 Long term (current) use of anticoagulants: Secondary | ICD-10-CM | POA: Diagnosis not present

## 2018-06-16 DIAGNOSIS — W19XXXA Unspecified fall, initial encounter: Secondary | ICD-10-CM | POA: Insufficient documentation

## 2018-06-16 DIAGNOSIS — Z79899 Other long term (current) drug therapy: Secondary | ICD-10-CM | POA: Diagnosis not present

## 2018-06-16 DIAGNOSIS — I4891 Unspecified atrial fibrillation: Secondary | ICD-10-CM | POA: Diagnosis not present

## 2018-06-16 DIAGNOSIS — F329 Major depressive disorder, single episode, unspecified: Secondary | ICD-10-CM | POA: Diagnosis not present

## 2018-06-16 DIAGNOSIS — G4733 Obstructive sleep apnea (adult) (pediatric): Secondary | ICD-10-CM | POA: Diagnosis not present

## 2018-06-16 DIAGNOSIS — S52121A Displaced fracture of head of right radius, initial encounter for closed fracture: Principal | ICD-10-CM | POA: Insufficient documentation

## 2018-06-16 DIAGNOSIS — Z79890 Hormone replacement therapy (postmenopausal): Secondary | ICD-10-CM | POA: Diagnosis not present

## 2018-06-16 DIAGNOSIS — I129 Hypertensive chronic kidney disease with stage 1 through stage 4 chronic kidney disease, or unspecified chronic kidney disease: Secondary | ICD-10-CM | POA: Insufficient documentation

## 2018-06-16 DIAGNOSIS — N189 Chronic kidney disease, unspecified: Secondary | ICD-10-CM | POA: Diagnosis not present

## 2018-06-16 DIAGNOSIS — K219 Gastro-esophageal reflux disease without esophagitis: Secondary | ICD-10-CM | POA: Diagnosis not present

## 2018-06-16 DIAGNOSIS — E11319 Type 2 diabetes mellitus with unspecified diabetic retinopathy without macular edema: Secondary | ICD-10-CM | POA: Diagnosis not present

## 2018-06-16 DIAGNOSIS — E7800 Pure hypercholesterolemia, unspecified: Secondary | ICD-10-CM | POA: Insufficient documentation

## 2018-06-16 DIAGNOSIS — E559 Vitamin D deficiency, unspecified: Secondary | ICD-10-CM | POA: Insufficient documentation

## 2018-06-16 DIAGNOSIS — S52041A Displaced fracture of coronoid process of right ulna, initial encounter for closed fracture: Secondary | ICD-10-CM | POA: Insufficient documentation

## 2018-06-16 DIAGNOSIS — M25531 Pain in right wrist: Secondary | ICD-10-CM | POA: Diagnosis not present

## 2018-06-16 DIAGNOSIS — S52123A Displaced fracture of head of unspecified radius, initial encounter for closed fracture: Secondary | ICD-10-CM | POA: Diagnosis present

## 2018-06-16 DIAGNOSIS — Z09 Encounter for follow-up examination after completed treatment for conditions other than malignant neoplasm: Secondary | ICD-10-CM

## 2018-06-16 LAB — CBC WITH DIFFERENTIAL/PLATELET
Abs Immature Granulocytes: 0.05 10*3/uL (ref 0.00–0.07)
Basophils Absolute: 0.1 10*3/uL (ref 0.0–0.1)
Basophils Relative: 1 %
Eosinophils Absolute: 0.1 10*3/uL (ref 0.0–0.5)
Eosinophils Relative: 1 %
HCT: 50.4 % — ABNORMAL HIGH (ref 36.0–46.0)
Hemoglobin: 15.3 g/dL — ABNORMAL HIGH (ref 12.0–15.0)
Immature Granulocytes: 0 %
Lymphocytes Relative: 17 %
Lymphs Abs: 2.1 10*3/uL (ref 0.7–4.0)
MCH: 29.4 pg (ref 26.0–34.0)
MCHC: 30.4 g/dL (ref 30.0–36.0)
MCV: 96.9 fL (ref 80.0–100.0)
Monocytes Absolute: 0.9 10*3/uL (ref 0.1–1.0)
Monocytes Relative: 7 %
Neutro Abs: 9.5 10*3/uL — ABNORMAL HIGH (ref 1.7–7.7)
Neutrophils Relative %: 74 %
Platelets: 344 10*3/uL (ref 150–400)
RBC: 5.2 MIL/uL — ABNORMAL HIGH (ref 3.87–5.11)
RDW: 14.3 % (ref 11.5–15.5)
WBC: 12.7 10*3/uL — ABNORMAL HIGH (ref 4.0–10.5)
nRBC: 0 % (ref 0.0–0.2)

## 2018-06-16 LAB — BASIC METABOLIC PANEL
Anion gap: 10 (ref 5–15)
BUN: 35 mg/dL — ABNORMAL HIGH (ref 8–23)
CO2: 23 mmol/L (ref 22–32)
Calcium: 10.3 mg/dL (ref 8.9–10.3)
Chloride: 105 mmol/L (ref 98–111)
Creatinine, Ser: 1.15 mg/dL — ABNORMAL HIGH (ref 0.44–1.00)
GFR calc Af Amer: 56 mL/min — ABNORMAL LOW (ref >60)
GFR calc non Af Amer: 49 mL/min — ABNORMAL LOW (ref >60)
Glucose, Bld: 124 mg/dL — ABNORMAL HIGH (ref 70–99)
Potassium: 3.9 mmol/L (ref 3.5–5.1)
Sodium: 138 mmol/L (ref 135–145)

## 2018-06-16 LAB — GLUCOSE, CAPILLARY: Glucose-Capillary: 132 mg/dL — ABNORMAL HIGH (ref 70–99)

## 2018-06-16 LAB — TYPE AND SCREEN
ABO/RH(D): A POS
ABO/RH(D): A POS
Antibody Screen: NEGATIVE
Antibody Screen: NEGATIVE

## 2018-06-16 LAB — PROTIME-INR
INR: 1.7 — ABNORMAL HIGH (ref 0.8–1.2)
Prothrombin Time: 20.1 seconds — ABNORMAL HIGH (ref 11.4–15.2)

## 2018-06-16 MED ORDER — DOCUSATE SODIUM 100 MG PO CAPS
100.0000 mg | ORAL_CAPSULE | Freq: Two times a day (BID) | ORAL | Status: DC
  Administered 2018-06-16: 100 mg via ORAL
  Filled 2018-06-16: qty 1

## 2018-06-16 MED ORDER — DULOXETINE HCL 60 MG PO CPEP: 60.0000 mg | ORAL_CAPSULE | Freq: Every day | ORAL | Status: DC

## 2018-06-16 MED ORDER — SENNOSIDES-DOCUSATE SODIUM 8.6-50 MG PO TABS: 1.0000 | ORAL_TABLET | Freq: Every evening | ORAL | Status: DC | PRN

## 2018-06-16 MED ORDER — ONDANSETRON HCL 4 MG PO TABS
4.0000 mg | ORAL_TABLET | Freq: Four times a day (QID) | ORAL | Status: DC | PRN
  Administered 2018-06-17: 4 mg via ORAL
  Filled 2018-06-16: qty 1

## 2018-06-16 MED ORDER — HYDROCHLOROTHIAZIDE 12.5 MG PO CAPS: 12.5000 mg | ORAL_CAPSULE | Freq: Every day | ORAL | Status: DC

## 2018-06-16 MED ORDER — MORPHINE SULFATE (PF) 4 MG/ML IV SOLN
4.0000 mg | Freq: Once | INTRAVENOUS | Status: AC
  Administered 2018-06-16: 4 mg via INTRAVENOUS
  Filled 2018-06-16: qty 1

## 2018-06-16 MED ORDER — ONDANSETRON HCL 4 MG/2ML IJ SOLN: 4.0000 mg | Freq: Four times a day (QID) | INTRAMUSCULAR | Status: DC | PRN

## 2018-06-16 MED ORDER — LEVOTHYROXINE SODIUM 25 MCG PO TABS
125.0000 ug | ORAL_TABLET | Freq: Every day | ORAL | Status: DC
  Administered 2018-06-17: 125 ug via ORAL
  Filled 2018-06-16: qty 1

## 2018-06-16 MED ORDER — METOPROLOL TARTRATE 50 MG PO TABS
50.0000 mg | ORAL_TABLET | Freq: Two times a day (BID) | ORAL | Status: DC
  Administered 2018-06-16: 50 mg via ORAL
  Filled 2018-06-16: qty 1

## 2018-06-16 MED ORDER — AMLODIPINE BESYLATE 5 MG PO TABS
5.0000 mg | ORAL_TABLET | Freq: Every evening | ORAL | Status: DC
  Administered 2018-06-16: 5 mg via ORAL
  Filled 2018-06-16: qty 1

## 2018-06-16 MED ORDER — ACETAMINOPHEN 500 MG PO TABS
1000.0000 mg | ORAL_TABLET | Freq: Three times a day (TID) | ORAL | Status: DC
  Administered 2018-06-16 – 2018-06-17 (×2): 1000 mg via ORAL
  Filled 2018-06-16 (×2): qty 2

## 2018-06-16 MED ORDER — DIPHENHYDRAMINE HCL 12.5 MG/5ML PO ELIX: 12.5000 mg | ORAL_SOLUTION | ORAL | Status: DC | PRN

## 2018-06-16 MED ORDER — HYDROMORPHONE HCL 1 MG/ML IJ SOLN
1.0000 mg | Freq: Once | INTRAMUSCULAR | Status: AC
  Administered 2018-06-16: 1 mg via INTRAVENOUS
  Filled 2018-06-16: qty 1

## 2018-06-16 MED ORDER — ATORVASTATIN CALCIUM 40 MG PO TABS
40.0000 mg | ORAL_TABLET | Freq: Every evening | ORAL | Status: DC
  Administered 2018-06-16: 40 mg via ORAL
  Filled 2018-06-16: qty 1

## 2018-06-16 MED ORDER — HYDROMORPHONE HCL 1 MG/ML IJ SOLN
0.5000 mg | INTRAMUSCULAR | Status: DC | PRN
  Administered 2018-06-17: 1 mg via INTRAVENOUS
  Filled 2018-06-16: qty 1

## 2018-06-16 MED ORDER — HYDROCODONE-ACETAMINOPHEN 5-325 MG PO TABS
1.0000 | ORAL_TABLET | Freq: Once | ORAL | Status: AC
  Administered 2018-06-16: 1 via ORAL
  Filled 2018-06-16: qty 1

## 2018-06-16 MED ORDER — METHOCARBAMOL 1000 MG/10ML IJ SOLN
500.0000 mg | Freq: Four times a day (QID) | INTRAVENOUS | Status: DC | PRN
  Filled 2018-06-16: qty 5

## 2018-06-16 MED ORDER — PANTOPRAZOLE SODIUM 40 MG PO TBEC: 40.0000 mg | DELAYED_RELEASE_TABLET | Freq: Every day | ORAL | Status: DC

## 2018-06-16 MED ORDER — METFORMIN HCL ER 500 MG PO TB24
1000.0000 mg | ORAL_TABLET | Freq: Every day | ORAL | Status: DC
  Administered 2018-06-16: 1000 mg via ORAL
  Filled 2018-06-16: qty 2

## 2018-06-16 MED ORDER — LACTATED RINGERS IV SOLN
INTRAVENOUS | Status: DC
  Administered 2018-06-16: 20:00:00 via INTRAVENOUS

## 2018-06-16 MED ORDER — INSULIN ASPART 100 UNIT/ML ~~LOC~~ SOLN: 0.0000 [IU] | Freq: Three times a day (TID) | SUBCUTANEOUS | Status: DC

## 2018-06-16 MED ORDER — IRBESARTAN 300 MG PO TABS
300.0000 mg | ORAL_TABLET | Freq: Every day | ORAL | Status: DC
  Administered 2018-06-16: 300 mg via ORAL
  Filled 2018-06-16: qty 1

## 2018-06-16 MED ORDER — METHOCARBAMOL 500 MG PO TABS
500.0000 mg | ORAL_TABLET | Freq: Four times a day (QID) | ORAL | Status: DC | PRN
  Administered 2018-06-17: 500 mg via ORAL
  Filled 2018-06-16: qty 1

## 2018-06-16 MED ORDER — DULAGLUTIDE 1.5 MG/0.5ML ~~LOC~~ SOAJ: 1.5000 mg | SUBCUTANEOUS | Status: DC

## 2018-06-16 MED ORDER — OXYCODONE HCL 5 MG PO TABS
5.0000 mg | ORAL_TABLET | ORAL | Status: DC | PRN
  Administered 2018-06-16: 10 mg via ORAL
  Filled 2018-06-16: qty 2

## 2018-06-16 NOTE — ED Provider Notes (Signed)
[facility]-EMERGENCY DEPT Provider Note   CSN: [id number] Arrival date & time: 06/16/18  1248    History   Chief Complaint Chief Complaint  Patient presents with  . Fall  . Elbow Pain  . Shoulder Pain    HPI [name] is a 68 y.o. female.     Patient is a 68 year old female with a history of hypertension, GERD, diabetes, A. fib on Xarelto presenting today with right elbow pain and right hand pain after a fall.  Patient was going into her garage when 2 birds flew out towards her head causing her to step backwards and stumbled over the inclined concrete.  She fell directly on her right hand and elbow.  She denies hitting her head, loss of consciousness.  She has had no pain that is new in her hips or legs and she has been able to walk.  The history is provided by the patient.  Fall  This is a new problem. The current episode started 1 to 2 hours ago. The problem occurs constantly. The problem has not changed since onset.Pertinent negatives include no chest pain, no abdominal pain and no headaches. Associated symptoms comments: Elbow and right hand pain. The symptoms are aggravated by bending. The symptoms are relieved by rest. She has tried rest for the symptoms. The treatment provided no relief.  Shoulder Pain    Past Medical History:  Diagnosis Date  . A-fib (HCC)   . Anemia    hx of  . Arthritis   . Chest pain   . Constipation   . Depression   . Diabetes mellitus ORAL MEDS  . Diabetic retinopathy (HCC)   . Dyspnea    with minimal exertion-deconditioned  . Dyspnea   . Dysrhythmia    a-fib  . Food allergy   . GERD (gastroesophageal reflux disease)    occasional  . Gout   . History of kidney stones   . Hypercholesteremia   . Hyperlipidemia   . Hypertension   . Hypertensive kidney disease   . Hypothyroidism   . Knee pain, right   . 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 Obesity   . OSA on CPAP    uses CPAP nightly  .  Osteoarthritis   . Palpitations   . Pinched nerve in neck   . Pleurisy   . Pneumonia    hx of  . PONV (postoperative nausea and vomiting)    for 3-4 days after general anesthesia  . Sciatica   . Swelling of knee joint, right   . Synovitis of knee RIGHT  . Trigger finger, left    left index  . Vitamin D deficiency     Patient Active Problem List   Diagnosis Date Noted  . Other fatigue 07/18/2017  . Shortness of breath on exertion 07/18/2017  . Type 2 diabetes mellitus with retinopathy, with long-term current use of insulin (HCC) 07/18/2017  . Vitamin D deficiency 07/18/2017  . B12 nutritional deficiency 07/18/2017  . Other specified hypothyroidism 07/18/2017  . Acute pain of right knee 12/22/2016  . Trigger index finger of left hand 09/22/2016  . 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 Diabetes (HCC) 01/08/2014  . Essential hypertension 01/08/2014  . Hyperlipidemia 01/08/2014  . Postoperative anemia due to acute blood loss 11/22/2012  . Hyponatremia 11/06/2012  . OA (osteoarthritis) of  knee 11/05/2012  . Villonodular synovitis of knee 04/20/2011    Past Surgical History:  Procedure Laterality Date  . CESAREAN SECTION  X3  . KNEE ARTHROSCOPY  04/20/2011   Procedure: ARTHROSCOPY KNEE;  Surgeon: Loanne Drilling, MD;  Location: [facility];  Service: Orthopedics;  Laterality: Right;  WITH SYNOVECTOMY  . LEFT CARPAL TUNNEL / LEFT MIDDLE & RING FINGER TRIGGER RELEASE  08-26-2008  . LEFT SHOULDER ARTHROSCOPY W/ DEBRIDEMENT  09-09-2003  . LEFT SHOULDER ARTHROSCOPY/ LEFT THUMB TRIGGER RELEASE  02-22-2005  . PHOTOCOAGULATION WITH LASER Right 03/20/2018   Procedure: PHOTOCOAGULATION WITH LASER;  Surgeon: Carmela Rima, MD;  Location: [facility];  Service: Ophthalmology;  Laterality: Right;  . PULLEY RELEASE LEFT LONG  FINGER  07-14-2009  . RIGHT CARPAL TUNNEL/ RIGHT THUMB TRIGGER RELEASE'S  11-28-2006  . RIGHT SHOULDER ARTHROSCOPY W/ ROTATOR CUFF REPAIR  01-13-2004  . SHOULDER ARTHROSCOPY DISTAL CLAVICLE EXCISION AND OPEN ROTATOR CUFF REPAIR  09-07-2004   LEFT  . SHOULDER ARTHROSCOPY W/ ACROMIAL REPAIR  11-29-2005   LEFT  . TONSILLECTOMY AND ADENOIDECTOMY  child  . TOTAL KNEE ARTHROPLASTY  12-14-2009   RIGHT  . TOTAL KNEE ARTHROPLASTY Left 11/05/2012   Procedure: LEFT TOTAL KNEE ARTHROPLASTY;  Surgeon: Loanne Drilling, MD;  Location: [facility];  Service: Orthopedics;  Laterality: Left;  . TOTAL KNEE REVISION Right 07/29/2016   Procedure: RIGHT KNEE POLY-LINER EXCHANGE;  Surgeon: Kathryne Hitch, MD;  Location: [facility];  Service: Orthopedics;  Laterality: Right;  . TRIGGER FINGER RELEASE Right 02/21/2013   Procedure: RIGHT RING A-1 PULLEY RELEASE    (MINOR PROCEDURE) ;  Surgeon: Wyn Forster., MD;  Location: [facility];  Service: Orthopedics;  Laterality: Right;  . TRIGGER FINGER RELEASE Left 09/22/2016   Procedure: RELEASE TRIGGER FINGER LEFT INDEX FINGER;  Surgeon: Kathryne Hitch, MD;  Location: [facility] OR;  Service: Orthopedics;  Laterality: Left;  . TRIGGER FINGER RELEASE Right 01/20/2017   Procedure: RELEASE TRIGGER FINGER/A-1 PULLEY RIGHT INDEX FINGER;  Surgeon: Marcene Corning, MD;  Location: [facility];  Service: Orthopedics;  Laterality: Right;  . VITRECTOMY 25 GAUGE WITH SCLERAL BUCKLE Right 03/20/2018   Procedure: RIGHT EYE VITRECTOMY WITH  ENDOLASER PARENTAL PHOTOCOAGULATION 25 GAUGE;  Surgeon: Carmela Rima, MD;  Location: [facility] OR;  Service: Ophthalmology;  Laterality: Right;     OB History    Gravida  3   Para      Term      Preterm      AB      Living        SAB      TAB      Ectopic      Multiple      Live Births               Home Medications    Prior to Admission medications   Medication Sig Start Date End Date Taking?  Authorizing Provider  amLODipine (NORVASC) 5 MG tablet Take 5 mg by mouth every evening.     [provider]  amoxicillin (AMOXIL) 500 MG capsule Take 2,000 mg by mouth as directed. Take 2000 mg 1 hour prior to dental work 03/09/17   [provider]  atorvastatin (LIPITOR) 40 MG tablet Take 40 mg by mouth every evening.     [provider]  calcium carbonate (CALCIUM 600) 600 MG TABS tablet Take 600 mg by mouth daily with breakfast.    [provider]  cholecalciferol (VITAMIN D3) 25 MCG (1000 UT) tablet Take 1,000 Units by mouth daily.    [provider]  DULoxetine (CYMBALTA) 60 MG capsule Take 60 mg by mouth daily.    [provider]  flecainide (TAMBOCOR) 50 MG tablet Take 1 tablet (50 mg total) by mouth 2 (two) times daily. Please keep appt in March for future refills. Thank you. 04/25/18   Wendall Stade, MD  hydrochlorothiazide (MICROZIDE) 12.5 MG capsule Take 12.5 mg by mouth daily.  04/05/17   [provider]  HYDROcodone-acetaminophen (NORCO/VICODIN) 5-325 MG tablet TAKE 1 TABLET BY MOUTH EVERY 6 HOURS AS NEEDED FOR MODERATE PAIN. Patient taking differently: Take 1 tablet by mouth every 6 (six) hours as needed for moderate pain or severe pain.  12/20/17   Kathryne Hitch, MD  indomethacin (INDOCIN) 50 MG capsule Take 50 mg by mouth 3 (three) times daily as needed (gout).    [provider]  insulin glargine (LANTUS) 100 UNIT/ML injection Inject 40 Units into the skin 2 (two) times daily.     [provider]  ketorolac (TORADOL) 10 MG tablet Take 10 mg by mouth 3 (three) times daily as needed (CHEST WALL PAIN/PLURACY).    [provider]  levothyroxine (SYNTHROID, LEVOTHROID) 125 MCG tablet Take 125 mcg by mouth daily before breakfast.     [provider]  Melatonin 5 MG CAPS Take 5-10 mg by mouth at bedtime as needed (sleep).     [provider]  metFORMIN (GLUCOPHAGE-XR) 500 MG  24 hr tablet Take 1,000 mg by mouth daily with supper.    [provider]  methocarbamol (ROBAXIN) 500 MG tablet Take 1 tablet (500 mg total) by mouth every 6 (six) hours as needed for muscle spasms. 04/25/18   Kathryne Hitch, MD  metoprolol tartrate (LOPRESSOR) 50 MG tablet Take 1 tablet (50 mg total) by mouth 2 (two) times daily. 06/12/18   Wendall Stade, MD  omeprazole (PRILOSEC) 20 MG capsule Take 20 mg by mouth daily.     [provider]  prednisoLONE acetate (PRED FORTE) 1 % ophthalmic suspension 1 drop 4 (four) times daily.    [provider]  rivaroxaban (XARELTO) 20 MG TABS tablet TAKE 1 TABLET BY MOUTH ONCE DAILY WITH SUPPER 05/14/18   Wendall Stade, MD  TRULICITY 1.5 MG/0.5ML SOPN Inject 1.5 mg as directed every Sunday.  05/11/17   [provider]  valsartan (DIOVAN) 320 MG tablet Take 320 mg by mouth daily.  04/25/17   [provider]    Family History Family History  Problem Relation Age of Onset  . Diabetes Mother   . Hypertension Mother   . Heart attack Mother   . Thyroid disease Mother   . Obesity Mother   . Cancer Father   . Liver disease Father   . Sleep apnea Father   . Alcoholism Father     Social History Social History   Tobacco Use  . Smoking status: Former Smoker    Years: 5.00    Types: Cigarettes    Last attempt to quit: 04/12/1977    Years since quitting: 41.2  . Smokeless tobacco: Never Used  Substance Use Topics  . Alcohol use: No  . Drug use: No     Allergies   Actos [pioglitazone hydrochloride]; Avandia [rosiglitazone]; Tetanus toxoids; and Food   Review of Systems Review of Systems  Cardiovascular: Negative for chest pain.  Gastrointestinal: Negative for abdominal pain.  Neurological: Negative  for headaches.  All other systems reviewed and are negative.    Physical Exam Updated Vital Signs BP (!) 162/71 (BP Location: Left Arm)   Pulse 66   Temp 97.9 F (36.6 C) (Oral)   Resp 18    SpO2 98%   Physical Exam Vitals signs and nursing note reviewed.  Constitutional:      General: She is not in acute distress.    Appearance: Normal appearance. She is obese.  HENT:     Head: Normocephalic.     Nose: Nose normal.  Eyes:     Pupils: Pupils are equal, round, and reactive to light.  Neck:     Musculoskeletal: Normal range of motion and neck supple. No muscular tenderness.  Cardiovascular:     Rate and Rhythm: Normal rate.  Pulmonary:     Effort: Pulmonary effort is normal.  Musculoskeletal:        General: Tenderness present.     Right shoulder: She exhibits normal range of motion, no tenderness, no bony tenderness, no deformity, no spasm, normal pulse and normal strength.     Right elbow: She exhibits decreased range of motion. She exhibits no deformity. Tenderness found. Medial epicondyle, lateral epicondyle and olecranon process tenderness noted.       Arms:     Right hand: She exhibits tenderness. She exhibits normal range of motion, no deformity, no laceration and no swelling. Normal sensation noted. Normal strength noted.       Hands:     Comments: 1+ radial pulse on the right and hand grip strength and sensation intact.  Pain with flexion and extension of the elbow.  No clavicle pain or discomfort with palpation of the shoulder  Skin:    General: Skin is warm.  Neurological:     General: No focal deficit present.     Mental Status: She is alert. Mental status is at baseline.  Psychiatric:        Mood and Affect: Mood normal.        Behavior: Behavior normal.        Thought Content: Thought content normal.      ED Treatments / Results  Labs (all labs ordered are listed, but only abnormal results are displayed) Labs Reviewed - No data to display  EKG None  Radiology Dg Shoulder Right  Result Date: 06/16/2018 CLINICAL DATA:  Fall today. Right shoulder injury and pain. Initial encounter. EXAM: RIGHT SHOULDER - 2+ VIEW COMPARISON:  None. FINDINGS:  There is no evidence of fracture or dislocation. Mild acromioclavicular degenerative spurring noted. Fracture deformity of right lateral 7th rib is seen, which is of indeterminate age. IMPRESSION: No evidence of shoulder fracture or dislocation. Right lateral 7th rib fracture deformity, of indeterminate age radiographically. Electronically Signed   By: Myles Rosenthal [name]   On: 06/16/2018 14:37   Dg Elbow Complete Right  Result Date: 06/16/2018 CLINICAL DATA:  Post fall, now with right elbow and wrist pain. EXAM: RIGHT ELBOW - COMPLETE 3+ VIEW COMPARISON:  None. FINDINGS: Best seen on the provided oblique radiograph is a minimally displaced fracture involving the head of the radius with extension to involve the adjacent radial-humeral joint. There are two tiny ossicles adjacent to the coronoid process of the ulna which may represent additional acute avulsion fractures. No definite elbow joint effusion. Expected adjacent soft tissue swelling.  No radiopaque foreign body. IMPRESSION: 1. Acute minimally displaced radial head fracture. 2. Ossicles adjacent to the coronoid process the ulna likely represents sequela  age-indeterminate though potentially acute avulsive injury. Electronically Signed   By: Simonne Come [name]   On: 06/16/2018 13:38   Dg Wrist Complete Right  Result Date: 06/16/2018 CLINICAL DATA:  Post fall, now with right wrist pain. EXAM: RIGHT WRIST - COMPLETE 3+ VIEW COMPARISON:  None. FINDINGS: No fracture or dislocation. Joint spaces are preserved. No erosions. No evidence of chondrocalcinosis. Regional soft tissues appear. No radiopaque foreign body. IMPRESSION: No fracture. If the patient has pain referable to the anatomic snuff box, splinting and a follow-up radiograph in 10 to 14 days is recommended to evaluate for occult scaphoid fracture. Electronically Signed   By: Simonne Come [name]   On: 06/16/2018 13:39    Procedures Procedures (including critical care time)  Medications Ordered in ED  Medications - No data to display   Initial Impression / Assessment and Plan / ED Course  I have reviewed the triage vital signs and the nursing notes.  Pertinent labs & imaging results that were available during my care of the patient were reviewed by me and considered in my medical decision making (see chart for details).       Patient with mechanical fall at home onto the right elbow.  She is able to flex and extend the elbow but it is painful when doing so.  No deformity noted and low suspicion for dislocation.  She does not have shoulder tenderness or clavicle tenderness and low suspicion for fracture.  She is also having pain over the thenar eminence of her right hand but no snuffbox tenderness or localized wrist pain.  Elbow and wrist films pending.  Patient is on Xarelto but denies hitting her head and states she was awake for the entire thing.  She does not have a headache or other concerning findings.  She has been able to ambulate and denies any new leg pain.  1:54 PM Elbow image shows an acute minimally displaced radial head fracture.  Ossicles adjacent to the coronoid process likely represents sequela age-indeterminate though potential acute avulsive injury cannot be excluded.  Imaging shows no fracture and patient does not have snuffbox tenderness concerning for scaphoid fracture.  Will discuss with Dr. Merlyn Lot for further recommendations.  On re-eval pt now stating she is having a moderate amt of pain in her right shoulder which she did not notice earlier.  Will get imaging  3:14 PM Shoulder imaging is neg.  Spoke with [facility] PA and he states Will f/u in office the week of 13th.  CT showed a radial head fracture involving the volar aspect with 10 mm of distal displacement of the fracture fragments.  Also comminuted fracture of the coronoid process with mild posterior subluxation of the ulna.  Final Clinical Impressions(s) / ED Diagnoses   Final diagnoses:  None    ED  Discharge Orders    None       Gwyneth Sprout, MD 06/17/18 1118

## 2018-06-16 NOTE — ED Notes (Signed)
Patient transported to CT 

## 2018-06-16 NOTE — ED Triage Notes (Signed)
Pt reports was in her garage when two birds flew out and one flew at her and when she stepped backwards forgot about the step and fell on right side of body. C/o elbow and shoulder pain. Denies LOC or hitting head but takes Xeralto.

## 2018-06-16 NOTE — ED Notes (Signed)
[facility] called for transport. 

## 2018-06-16 NOTE — ED Provider Notes (Signed)
  Physical Exam  BP (!) 152/78   Pulse 67   Temp 97.9 F (36.6 C) (Oral)   Resp 18   SpO2 98%   Physical Exam  ED Course/Procedures     Procedures  MDM  Care assumed at 4 pm. Patient had a fall and had R radial head fracture with subluxation. Dr. Deveron Furlong discussed with Dr. Merlyn Lot and ortho PA and sign out pending final decision by ortho.   4:30 pm Dr. Everardo Pacific called me back. He states that patient's fracture is unstable. Recommend posterior elbow splint and admission to [facility]. NPO after midnight and he will do surgery tomorrow. He will be admitting patient to his service. Temp orders placed        Charlynne Pander, MD 06/16/18 [phone]

## 2018-06-17 ENCOUNTER — Observation Stay (HOSPITAL_COMMUNITY): Payer: 59 | Admitting: Anesthesiology

## 2018-06-17 ENCOUNTER — Encounter (HOSPITAL_COMMUNITY)
Admission: EM | Disposition: A | Discharge status: Home / Self Care | Payer: Self-pay | Source: Home / Self Care | Attending: Orthopaedic Surgery

## 2018-06-17 ENCOUNTER — Encounter (HOSPITAL_COMMUNITY): Payer: Self-pay | Admitting: Certified Registered Nurse Anesthetist

## 2018-06-17 ENCOUNTER — Observation Stay (HOSPITAL_COMMUNITY): Payer: 59

## 2018-06-17 DIAGNOSIS — I4891 Unspecified atrial fibrillation: Secondary | ICD-10-CM | POA: Diagnosis not present

## 2018-06-17 DIAGNOSIS — N189 Chronic kidney disease, unspecified: Secondary | ICD-10-CM | POA: Diagnosis not present

## 2018-06-17 DIAGNOSIS — E11319 Type 2 diabetes mellitus with unspecified diabetic retinopathy without macular edema: Secondary | ICD-10-CM | POA: Diagnosis not present

## 2018-06-17 DIAGNOSIS — G4733 Obstructive sleep apnea (adult) (pediatric): Secondary | ICD-10-CM | POA: Diagnosis not present

## 2018-06-17 DIAGNOSIS — S52041A Displaced fracture of coronoid process of right ulna, initial encounter for closed fracture: Secondary | ICD-10-CM | POA: Diagnosis not present

## 2018-06-17 DIAGNOSIS — S52121A Displaced fracture of head of right radius, initial encounter for closed fracture: Secondary | ICD-10-CM | POA: Diagnosis not present

## 2018-06-17 DIAGNOSIS — I129 Hypertensive chronic kidney disease with stage 1 through stage 4 chronic kidney disease, or unspecified chronic kidney disease: Secondary | ICD-10-CM | POA: Diagnosis not present

## 2018-06-17 DIAGNOSIS — K219 Gastro-esophageal reflux disease without esophagitis: Secondary | ICD-10-CM | POA: Diagnosis not present

## 2018-06-17 DIAGNOSIS — S5321XA Traumatic rupture of right radial collateral ligament, initial encounter: Secondary | ICD-10-CM | POA: Diagnosis not present

## 2018-06-17 DIAGNOSIS — G8918 Other acute postprocedural pain: Secondary | ICD-10-CM | POA: Diagnosis not present

## 2018-06-17 HISTORY — PX: RADIAL HEAD ARTHROPLASTY: SHX6044

## 2018-06-17 LAB — SURGICAL PCR SCREEN
MRSA, PCR: NEGATIVE
Staphylococcus aureus: NEGATIVE

## 2018-06-17 LAB — GLUCOSE, CAPILLARY
Glucose-Capillary: 195 mg/dL — ABNORMAL HIGH (ref 70–99)
Glucose-Capillary: 218 mg/dL — ABNORMAL HIGH (ref 70–99)

## 2018-06-17 LAB — ABO/RH: ABO/RH(D): A POS

## 2018-06-17 LAB — PROTIME-INR
INR: 1.3 — ABNORMAL HIGH (ref 0.8–1.2)
Prothrombin Time: 15.6 seconds — ABNORMAL HIGH (ref 11.4–15.2)

## 2018-06-17 LAB — HIV ANTIBODY (ROUTINE TESTING W REFLEX): HIV Screen 4th Generation wRfx: NONREACTIVE

## 2018-06-17 SURGERY — ARTHROPLASTY, RADIUS, HEAD
Anesthesia: General | Laterality: Right

## 2018-06-17 MED ORDER — EPHEDRINE 5 MG/ML INJ
INTRAVENOUS | Status: AC
  Filled 2018-06-17: qty 10

## 2018-06-17 MED ORDER — PROPOFOL 10 MG/ML IV BOLUS
INTRAVENOUS | Status: AC
  Filled 2018-06-17: qty 20

## 2018-06-17 MED ORDER — DEXAMETHASONE SODIUM PHOSPHATE 10 MG/ML IJ SOLN
INTRAMUSCULAR | Status: AC
  Filled 2018-06-17: qty 1

## 2018-06-17 MED ORDER — FENTANYL CITRATE (PF) 250 MCG/5ML IJ SOLN
INTRAMUSCULAR | Status: DC | PRN
  Administered 2018-06-17: 25 ug via INTRAVENOUS
  Administered 2018-06-17: 50 ug via INTRAVENOUS
  Administered 2018-06-17: 25 ug via INTRAVENOUS

## 2018-06-17 MED ORDER — ACETAMINOPHEN 500 MG PO TABS: 1000.0000 mg | ORAL_TABLET | Freq: Three times a day (TID) | ORAL | 0 refills | Status: AC

## 2018-06-17 MED ORDER — SUGAMMADEX SODIUM 200 MG/2ML IV SOLN
INTRAVENOUS | Status: DC | PRN
  Administered 2018-06-17: 200 mg via INTRAVENOUS

## 2018-06-17 MED ORDER — VANCOMYCIN HCL 1 G IV SOLR
INTRAVENOUS | Status: DC | PRN
  Administered 2018-06-17: 1000 mg

## 2018-06-17 MED ORDER — MIDAZOLAM HCL 5 MG/5ML IJ SOLN
INTRAMUSCULAR | Status: DC | PRN
  Administered 2018-06-17 (×2): 1 mg via INTRAVENOUS

## 2018-06-17 MED ORDER — SODIUM CHLORIDE 0.9 % IV SOLN
INTRAVENOUS | Status: DC | PRN
  Administered 2018-06-17: 25 ug/min via INTRAVENOUS

## 2018-06-17 MED ORDER — PHENYLEPHRINE 40 MCG/ML (10ML) SYRINGE FOR IV PUSH (FOR BLOOD PRESSURE SUPPORT)
PREFILLED_SYRINGE | INTRAVENOUS | Status: AC
  Filled 2018-06-17: qty 10

## 2018-06-17 MED ORDER — EPHEDRINE SULFATE-NACL 50-0.9 MG/10ML-% IV SOSY
PREFILLED_SYRINGE | INTRAVENOUS | Status: DC | PRN
  Administered 2018-06-17 (×3): 10 mg via INTRAVENOUS

## 2018-06-17 MED ORDER — LIDOCAINE 2% (20 MG/ML) 5 ML SYRINGE
INTRAMUSCULAR | Status: AC
  Filled 2018-06-17: qty 5

## 2018-06-17 MED ORDER — 0.9 % SODIUM CHLORIDE (POUR BTL) OPTIME
TOPICAL | Status: DC | PRN
  Administered 2018-06-17: 1000 mL

## 2018-06-17 MED ORDER — ROCURONIUM BROMIDE 50 MG/5ML IV SOSY
PREFILLED_SYRINGE | INTRAVENOUS | Status: DC | PRN
  Administered 2018-06-17: 50 mg via INTRAVENOUS

## 2018-06-17 MED ORDER — EPHEDRINE SULFATE 50 MG/ML IJ SOLN: INTRAMUSCULAR | Status: DC | PRN

## 2018-06-17 MED ORDER — SUCCINYLCHOLINE CHLORIDE 200 MG/10ML IV SOSY
PREFILLED_SYRINGE | INTRAVENOUS | Status: AC
  Filled 2018-06-17: qty 10

## 2018-06-17 MED ORDER — CEFAZOLIN SODIUM 1 G IJ SOLR
INTRAMUSCULAR | Status: AC
  Filled 2018-06-17: qty 20

## 2018-06-17 MED ORDER — PHENYLEPHRINE 40 MCG/ML (10ML) SYRINGE FOR IV PUSH (FOR BLOOD PRESSURE SUPPORT)
PREFILLED_SYRINGE | INTRAVENOUS | Status: DC | PRN
  Administered 2018-06-17: 120 ug via INTRAVENOUS
  Administered 2018-06-17: 80 ug via INTRAVENOUS

## 2018-06-17 MED ORDER — ROCURONIUM BROMIDE 50 MG/5ML IV SOSY
PREFILLED_SYRINGE | INTRAVENOUS | Status: AC
  Filled 2018-06-17: qty 5

## 2018-06-17 MED ORDER — OXYCODONE HCL 5 MG PO TABS: ORAL_TABLET | ORAL | 0 refills | Status: AC

## 2018-06-17 MED ORDER — ONDANSETRON HCL 4 MG PO TABS: 4.0000 mg | ORAL_TABLET | Freq: Three times a day (TID) | ORAL | 1 refills | Status: AC | PRN

## 2018-06-17 MED ORDER — PHENYLEPHRINE HCL 10 MG/ML IJ SOLN: INTRAMUSCULAR | Status: DC | PRN

## 2018-06-17 MED ORDER — PROPOFOL 10 MG/ML IV BOLUS
INTRAVENOUS | Status: DC | PRN
  Administered 2018-06-17: 130 mg via INTRAVENOUS

## 2018-06-17 MED ORDER — LACTATED RINGERS IV SOLN
INTRAVENOUS | Status: DC | PRN
  Administered 2018-06-17 (×2): via INTRAVENOUS

## 2018-06-17 MED ORDER — SUCCINYLCHOLINE CHLORIDE 200 MG/10ML IV SOSY
PREFILLED_SYRINGE | INTRAVENOUS | Status: DC | PRN
  Administered 2018-06-17: 100 mg via INTRAVENOUS

## 2018-06-17 MED ORDER — CEFAZOLIN SODIUM-DEXTROSE 2-3 GM-%(50ML) IV SOLR
INTRAVENOUS | Status: DC | PRN
  Administered 2018-06-17: 2 g via INTRAVENOUS

## 2018-06-17 MED ORDER — MIDAZOLAM HCL 2 MG/2ML IJ SOLN
INTRAMUSCULAR | Status: AC
  Filled 2018-06-17: qty 2

## 2018-06-17 MED ORDER — FENTANYL CITRATE (PF) 250 MCG/5ML IJ SOLN
INTRAMUSCULAR | Status: AC
  Filled 2018-06-17: qty 5

## 2018-06-17 MED ORDER — ONDANSETRON HCL 4 MG/2ML IJ SOLN
INTRAMUSCULAR | Status: AC
  Filled 2018-06-17: qty 2

## 2018-06-17 MED ORDER — VANCOMYCIN HCL 1000 MG IV SOLR
INTRAVENOUS | Status: AC
  Filled 2018-06-17: qty 1000

## 2018-06-17 MED ORDER — CELECOXIB 100 MG PO CAPS: 100.0000 mg | ORAL_CAPSULE | Freq: Two times a day (BID) | ORAL | 2 refills | Status: AC

## 2018-06-17 MED ORDER — ONDANSETRON HCL 4 MG/2ML IJ SOLN
INTRAMUSCULAR | Status: DC | PRN
  Administered 2018-06-17 (×2): 4 mg via INTRAVENOUS

## 2018-06-17 MED ORDER — LIDOCAINE 2% (20 MG/ML) 5 ML SYRINGE
INTRAMUSCULAR | Status: DC | PRN
  Administered 2018-06-17: 60 mg via INTRAVENOUS

## 2018-06-17 MED ORDER — BUPIVACAINE-EPINEPHRINE (PF) 0.5% -1:200000 IJ SOLN
INTRAMUSCULAR | Status: DC | PRN
  Administered 2018-06-17: 30 mL via PERINEURAL

## 2018-06-17 SURGICAL SUPPLY — 58 items
ANCHOR JUGGERKNOT W/DRL 2/1.45 (Orthopedic Implant) ×2 IMPLANT
ANCHOR SUT 1.45 SZ 1 SHORT (Anchor) ×2 IMPLANT
BANDAGE ELASTIC 4 VELCRO ST LF (GAUZE/BANDAGES/DRESSINGS) ×2 IMPLANT
BLADE AVERAGE 25X9 (BLADE) ×2 IMPLANT
BLADE SAGITTAL 25.0X1.27X90 (BLADE) ×2 IMPLANT
BNDG ELASTIC 6X10 VLCR STRL LF (GAUZE/BANDAGES/DRESSINGS) ×2 IMPLANT
CHLORAPREP W/TINT 26ML (MISCELLANEOUS) ×4 IMPLANT
CLSR STERI-STRIP ANTIMIC 1/2X4 (GAUZE/BANDAGES/DRESSINGS) ×2 IMPLANT
COVER SURGICAL LIGHT HANDLE (MISCELLANEOUS) ×2 IMPLANT
COVER WAND RF STERILE (DRAPES) ×2 IMPLANT
DRAPE INCISE IOBAN 66X45 STRL (DRAPES) ×2 IMPLANT
DRAPE ORTHO SPLIT 77X108 STRL (DRAPES) ×2
DRAPE SURG ORHT 6 SPLT 77X108 (DRAPES) ×2 IMPLANT
DRAPE U-SHAPE 47X51 STRL (DRAPES) ×2 IMPLANT
ELECT BLADE 4.0 EZ CLEAN MEGAD (MISCELLANEOUS)
ELECT CAUTERY BLADE 6.4 (BLADE) ×2 IMPLANT
ELECT REM PT RETURN 9FT ADLT (ELECTROSURGICAL) ×2
ELECTRODE BLDE 4.0 EZ CLN MEGD (MISCELLANEOUS) IMPLANT
ELECTRODE REM PT RTRN 9FT ADLT (ELECTROSURGICAL) ×1 IMPLANT
GAUZE SPONGE 4X4 12PLY STRL LF (GAUZE/BANDAGES/DRESSINGS) ×2 IMPLANT
GAUZE XEROFORM 5X9 LF (GAUZE/BANDAGES/DRESSINGS) ×2 IMPLANT
GLOVE BIO SURGEON STRL SZ8 (GLOVE) ×4 IMPLANT
GLOVE BIOGEL PI IND STRL 8 (GLOVE) ×1 IMPLANT
GLOVE BIOGEL PI INDICATOR 8 (GLOVE) ×1
GLOVE BIOGEL PI ORTHO PRO SZ8 (GLOVE)
GLOVE PI ORTHO PRO STRL SZ8 (GLOVE) IMPLANT
GOWN STRL REUS W/ TWL LRG LVL3 (GOWN DISPOSABLE) ×2 IMPLANT
GOWN STRL REUS W/ TWL XL LVL3 (GOWN DISPOSABLE) ×2 IMPLANT
GOWN STRL REUS W/TWL 2XL LVL3 (GOWN DISPOSABLE) IMPLANT
GOWN STRL REUS W/TWL LRG LVL3 (GOWN DISPOSABLE) ×2
GOWN STRL REUS W/TWL XL LVL3 (GOWN DISPOSABLE) ×2
HEAD RADIAL IMPL 20 (Joint) ×2 IMPLANT
KIT BASIN OR (CUSTOM PROCEDURE TRAY) ×2 IMPLANT
KIT TURNOVER KIT B (KITS) ×2 IMPLANT
MANIFOLD NEPTUNE II (INSTRUMENTS) ×2 IMPLANT
NEEDLE MAYO TROCAR (NEEDLE) ×2 IMPLANT
PACK TOTAL JOINT (CUSTOM PROCEDURE TRAY) ×2 IMPLANT
PAD ARMBOARD 7.5X6 YLW CONV (MISCELLANEOUS) ×4 IMPLANT
PADDING CAST COTTON 6X4 STRL (CAST SUPPLIES) ×2 IMPLANT
PADDING CAST SYNTHETIC 4 (CAST SUPPLIES) ×1
PADDING CAST SYNTHETIC 4X4 STR (CAST SUPPLIES) ×1 IMPLANT
PILLOW ABDUCTION HIP (SOFTGOODS) ×2 IMPLANT
RETRIEVER SUT HEWSON (MISCELLANEOUS) ×4 IMPLANT
SPLINT PLASTER CAST XFAST 5X30 (CAST SUPPLIES) ×1 IMPLANT
SPLINT PLASTER XFAST SET 5X30 (CAST SUPPLIES) ×1
STAPLER VISISTAT 35W (STAPLE) IMPLANT
STEM RADIAL 7 +2 (Stem) ×2 IMPLANT
SUT FIBERWIRE #2 38 REV NDL BL (SUTURE) ×4
SUT MAXBRAID (SUTURE) ×2 IMPLANT
SUT MNCRL AB 4-0 PS2 18 (SUTURE) ×2 IMPLANT
SUT MON AB 3-0 SH 27 (SUTURE)
SUT MON AB 3-0 SH27 (SUTURE) IMPLANT
SUT VIC AB 0 CT1 27 (SUTURE) ×5
SUT VIC AB 0 CT1 27XBRD ANBCTR (SUTURE) ×5 IMPLANT
SUT VIC AB 2-0 CT1 27 (SUTURE) ×3
SUT VIC AB 2-0 CT1 TAPERPNT 27 (SUTURE) ×3 IMPLANT
SUTURE FIBERWR#2 38 REV NDL BL (SUTURE) ×2 IMPLANT
TRAY FOLEY W/BAG SLVR 14FR (SET/KITS/TRAYS/PACK) ×2 IMPLANT

## 2018-06-17 NOTE — Transfer of Care (Signed)
Immediate Anesthesia Transfer of Care Note  Patient: [name]  Procedure(s) Performed: RADIAL HEAD ARTHROPLASTY (Right )  Patient Location: PACU  Anesthesia Type:General and Regional  Level of Consciousness: awake and alert   Airway & Oxygen Therapy: Patient Spontanous Breathing and Patient connected to face mask oxygen  Post-op Assessment: Report given to RN and Post -op Vital signs reviewed and stable  Post vital signs: Reviewed and stable  Last Vitals:  Vitals Value Taken Time  BP 158/51 06/17/2018 10:26 AM  Temp    Pulse 74 06/17/2018 10:26 AM  Resp 16 06/17/2018 10:26 AM  SpO2 96 % 06/17/2018 10:26 AM  Vitals shown include unvalidated device data.  Last Pain:  Vitals:   06/17/18 0531  TempSrc: Oral  PainSc:          Complications: No apparent anesthesia complications

## 2018-06-17 NOTE — H&P (Signed)
ORTHOPAEDIC ADMISSION Chief Complaint: Right terrible triad injury  HPI: [name] is a 68 y.o. female with fall resulting in a immediate pain and deformity in the right elbow.  She was initially at [facility] call team was alerted for her.  They did not feel comfortable taking care of this injury and consulted me to provide care.  Patient has no numbness or tingling in the hand.  She has no other areas of injury.  She is relatively comfortable in her splint.  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  Past Medical History:  Diagnosis Date   A-fib ([place])    Anemia    hx of   Arthritis    Chest pain    Constipation    Depression    Diabetes mellitus ORAL [facility]   Diabetic retinopathy ([place])    Dyspnea    with minimal exertion-deconditioned   Dyspnea    Dysrhythmia    a-fib   Food allergy    GERD (gastroesophageal reflux disease)    occasional   Gout    History of kidney stones    Hypercholesteremia    Hyperlipidemia    Hypertension    Hypertensive kidney disease    Hypothyroidism    Knee pain, right    Left arm numbness DUE TO CERVICAL PINCHED NERVE   Obesity    OSA on CPAP    uses CPAP nightly   Osteoarthritis    Palpitations    Pinched nerve in neck    Pleurisy    Pneumonia    hx of   PONV (postoperative nausea and vomiting)    for 3-4 days after general anesthesia   Sciatica    Swelling of knee joint, right    Synovitis of knee RIGHT   Trigger finger, left    left index   Vitamin D deficiency    Past Surgical History:  Procedure Laterality Date   CESAREAN SECTION  X3   KNEE ARTHROSCOPY  04/20/2011   Procedure: ARTHROSCOPY KNEE;  Surgeon: Gearlean Alf, MD;  Location: [facility];  Service: Orthopedics;  Laterality: Right;  WITH SYNOVECTOMY   LEFT CARPAL TUNNEL / LEFT MIDDLE & RING FINGER TRIGGER RELEASE  08-26-2008   LEFT SHOULDER ARTHROSCOPY W/ DEBRIDEMENT   09-09-2003   LEFT SHOULDER ARTHROSCOPY/ LEFT THUMB TRIGGER RELEASE  02-22-2005   PHOTOCOAGULATION WITH LASER Right 03/20/2018   Procedure: PHOTOCOAGULATION WITH LASER;  Surgeon: Jalene Mullet, MD;  Location: [facility];  Service: Ophthalmology;  Laterality: Right;   PULLEY RELEASE LEFT LONG FINGER  07-14-2009   RIGHT CARPAL TUNNEL/ RIGHT THUMB TRIGGER RELEASE'S  11-28-2006   RIGHT SHOULDER ARTHROSCOPY W/ ROTATOR CUFF REPAIR  01-13-2004   SHOULDER ARTHROSCOPY DISTAL CLAVICLE EXCISION AND OPEN ROTATOR CUFF REPAIR  09-07-2004   LEFT   SHOULDER ARTHROSCOPY W/ ACROMIAL REPAIR  11-29-2005   LEFT   TONSILLECTOMY AND ADENOIDECTOMY  child   TOTAL KNEE ARTHROPLASTY  12-14-2009   RIGHT   TOTAL KNEE ARTHROPLASTY Left 11/05/2012   Procedure: LEFT TOTAL KNEE ARTHROPLASTY;  Surgeon: Gearlean Alf, MD;  Location: [facility];  Service: Orthopedics;  Laterality: Left;   TOTAL KNEE REVISION Right 07/29/2016   Procedure: RIGHT KNEE POLY-LINER EXCHANGE;  Surgeon: Mcarthur Rossetti, MD;  Location: [facility];  Service: Orthopedics;  Laterality: Right;   TRIGGER FINGER RELEASE Right 02/21/2013   Procedure: RIGHT RING A-1 PULLEY RELEASE    (MINOR PROCEDURE) ;  Surgeon: Herbie Baltimore  Christena Flake., MD;  Location: [facility];  Service: Orthopedics;  Laterality: Right;   TRIGGER FINGER RELEASE Left 09/22/2016   Procedure: RELEASE TRIGGER FINGER LEFT INDEX FINGER;  Surgeon: Mcarthur Rossetti, MD;  Location: [facility];  Service: Orthopedics;  Laterality: Left;   TRIGGER FINGER RELEASE Right 01/20/2017   Procedure: RELEASE TRIGGER FINGER/A-1 PULLEY RIGHT INDEX FINGER;  Surgeon: Melrose Nakayama, MD;  Location: [facility];  Service: Orthopedics;  Laterality: Right;   VITRECTOMY 25 GAUGE WITH SCLERAL BUCKLE Right 03/20/2018   Procedure: RIGHT EYE VITRECTOMY WITH  ENDOLASER PARENTAL PHOTOCOAGULATION 25 GAUGE;  Surgeon: Jalene Mullet, MD;  Location: [facility];  Service: Ophthalmology;  Laterality:  Right;   Social History   Socioeconomic History   Marital status: Single    Spouse name: Not on file   Number of children: Not on file   Years of education: Not on file   Highest education level: Not on file  Occupational History   Not on file  Social Needs   Financial resource strain: Not on file   Food insecurity:    Worry: Not on file    Inability: Not on file   Transportation needs:    Medical: Not on file    Non-medical: Not on file  Tobacco Use   Smoking status: Former Smoker    Years: 5.00    Types: Cigarettes    Last attempt to quit: 04/12/1977    Years since quitting: 41.2   Smokeless tobacco: Never Used  Substance and Sexual Activity   Alcohol use: No   Drug use: No   Sexual activity: Not on file  Lifestyle   Physical activity:    Days per week: Not on file    Minutes per session: Not on file   Stress: Not on file  Relationships   Social connections:    Talks on phone: Not on file    Gets together: Not on file    Attends religious service: Not on file    Active member of club or organization: Not on file    Attends meetings of clubs or organizations: Not on file    Relationship status: Not on file  Other Topics Concern   Not on file  Social History Narrative   Not on file   Family History  Problem Relation Age of Onset   Diabetes Mother    Hypertension Mother    Heart attack Mother    Thyroid disease Mother    Obesity Mother    Cancer Father    Liver disease Father    Sleep apnea Father    Alcoholism Father    Allergies  Allergen Reactions   Actos [Pioglitazone Hydrochloride] Swelling and Other (See Comments)    Numbness in lips, HEADACHES SWELLING REACTION UNSPECIFIED    Avandia [Rosiglitazone] Swelling    Numbness in lips, headaches  SWELLING REACTION UNSPECIFIED    Tetanus Toxoids Other (See Comments)    Arm swelling, fainted, ?Respiratory distress  (horse serum)   Food Nausea Only    Eggplant   Prior  to Admission medications   Medication Sig Start Date End Date Taking? Authorizing Provider  amLODipine (NORVASC) 5 MG tablet Take 5 mg by mouth every evening.    Yes [provider]  atorvastatin (LIPITOR) 40 MG tablet Take 40 mg by mouth every evening.    Yes [provider]  calcium carbonate (CALCIUM 600) 600 MG TABS tablet Take 600 mg by mouth daily with breakfast.  Yes [provider]  cholecalciferol (VITAMIN D3) 25 MCG (1000 UT) tablet Take 1,000 Units by mouth daily.   Yes [provider]  DULoxetine (CYMBALTA) 60 MG capsule Take 60 mg by mouth daily.   Yes [provider]  flecainide (TAMBOCOR) 50 MG tablet Take 1 tablet (50 mg total) by mouth 2 (two) times daily. Please keep appt in March for future refills. Thank you. 04/25/18  Yes Josue Hector, MD  hydrochlorothiazide (MICROZIDE) 12.5 MG capsule Take 12.5 mg by mouth daily.  04/05/17  Yes [provider]  insulin glargine (LANTUS) 100 UNIT/ML injection Inject 40 Units into the skin 2 (two) times daily.    Yes [provider]  levothyroxine (SYNTHROID, LEVOTHROID) 125 MCG tablet Take 125 mcg by mouth daily before breakfast.    Yes [provider]  Melatonin 5 MG CAPS Take 5-10 mg by mouth at bedtime as needed (sleep).    Yes [provider]  metFORMIN (GLUCOPHAGE-XR) 500 MG 24 hr tablet Take 1,000 mg by mouth daily with supper.   Yes [provider]  methocarbamol (ROBAXIN) 500 MG tablet Take 1 tablet (500 mg total) by mouth every 6 (six) hours as needed for muscle spasms. 04/25/18  Yes Mcarthur Rossetti, MD  metoprolol tartrate (LOPRESSOR) 50 MG tablet Take 1 tablet (50 mg total) by mouth 2 (two) times daily. 06/12/18  Yes Josue Hector, MD  omeprazole (PRILOSEC) 20 MG capsule Take 20 mg by mouth daily.    Yes [provider]  rivaroxaban (XARELTO) 20 MG TABS tablet TAKE 1 TABLET BY MOUTH ONCE DAILY WITH SUPPER Patient taking  differently: Take 20 mg by mouth daily with supper. TAKE 1 TABLET BY MOUTH ONCE DAILY WITH SUPPER 05/14/18  Yes Josue Hector, MD  TRULICITY 1.5 NK/5.3ZJ SOPN Inject 1.5 mg as directed every Sunday.  05/11/17  Yes [provider]  valsartan (DIOVAN) 320 MG tablet Take 320 mg by mouth daily.  04/25/17  Yes [provider]  HYDROcodone-acetaminophen (NORCO/VICODIN) 5-325 MG tablet TAKE 1 TABLET BY MOUTH EVERY 6 HOURS AS NEEDED FOR MODERATE PAIN. Patient not taking: No sig reported 12/20/17   Mcarthur Rossetti, MD   Dg Shoulder Right  Result Date: 06/16/2018 CLINICAL DATA:  Fall today. Right shoulder injury and pain. Initial encounter. EXAM: RIGHT SHOULDER - 2+ VIEW COMPARISON:  None. FINDINGS: There is no evidence of fracture or dislocation. Mild acromioclavicular degenerative spurring noted. Fracture deformity of right lateral 7th rib is seen, which is of indeterminate age. IMPRESSION: No evidence of shoulder fracture or dislocation. Right lateral 7th rib fracture deformity, of indeterminate age radiographically. Electronically Signed   By: Earle Gell [name]   On: 06/16/2018 14:37   Dg Elbow Complete Right  Result Date: 06/16/2018 CLINICAL DATA:  Post fall, now with right elbow and wrist pain. EXAM: RIGHT ELBOW - COMPLETE 3+ VIEW COMPARISON:  None. FINDINGS: Best seen on the provided oblique radiograph is a minimally displaced fracture involving the head of the radius with extension to involve the adjacent radial-humeral joint. There are two tiny ossicles adjacent to the coronoid process of the ulna which may represent additional acute avulsion fractures. No definite elbow joint effusion. Expected adjacent soft tissue swelling.  No radiopaque foreign body. IMPRESSION: 1. Acute minimally displaced radial head fracture. 2. Ossicles adjacent to the coronoid process the ulna likely represents sequela age-indeterminate though potentially acute avulsive injury. Electronically Signed   By: Sandi Mariscal [name]   On: 06/16/2018  13:38   Dg Wrist Complete Right  Result Date: 06/16/2018 CLINICAL DATA:  Post fall, now with right wrist pain. EXAM: RIGHT WRIST - COMPLETE 3+ VIEW COMPARISON:  None. FINDINGS: No fracture or dislocation. Joint spaces are preserved. No erosions. No evidence of chondrocalcinosis. Regional soft tissues appear. No radiopaque foreign body. IMPRESSION: No fracture. If the patient has pain referable to the anatomic snuff box, splinting and a follow-up radiograph in 10 to 14 days is recommended to evaluate for occult scaphoid fracture. Electronically Signed   By: Sandi Mariscal [name]   On: 06/16/2018 13:39   Ct Elbow Right Wo Contrast  Result Date: 06/16/2018 CLINICAL DATA:  Status post fall, right elbow fracture EXAM: CT OF THE LOWER RIGHT EXTREMITY WITHOUT CONTRAST TECHNIQUE: Multidetector CT imaging of the right lower extremity was performed according to the standard protocol. COMPARISON:  None. FINDINGS: Bones/Joint/Cartilage Displaced radial head fracture involving the volar aspect with 10 mm of distal displacement of the fracture fragment located anterior to the capitellum. Posterior dislocation of the radial head relative to the capitellum. Comminuted fracture of the coronoid process with mild posterior subluxation of the ulna relative to the humerus with the humerus perched on the fractured coronoid process. No other acute fracture. Moderate-sized joint effusion. No aggressive osseous lesion. No periosteal reaction. Ligaments Suboptimally assessed by CT. Muscles and Tendons Muscles are normal. No muscle atrophy. No intramuscular fluid collection or hematoma. Soft tissues No soft tissue mass or hematoma. No focal fluid collection. No radiopaque foreign body. IMPRESSION: 1. Displaced radial head fracture involving the volar aspect with 10 mm of distal displacement of the fracture fragment. Posterior dislocation of the radial head relative to the capitellum. 2. Comminuted fracture of the  coronoid process with mild posterior subluxation of the ulna relative to the humerus with the humerus perched on the fractured coronoid process. Electronically Signed   By: Kathreen Devoid   On: 06/16/2018 15:08   Family History Reviewed and non-contributory, no pertinent history of problems with bleeding or anesthesia      Review of Systems 14 system ROS conducted and negative except for that noted in HPI   OBJECTIVE  Vitals: Patient Vitals for the past 8 hrs:  BP Temp Temp src Pulse SpO2  06/17/18 0531 (!) 155/95 98.1 F (36.7 C) Oral (!) 59 97 %   General: Alert, no acute distress Cardiovascular: Warm extremities noted Respiratory: No cyanosis, no use of accessory musculature GI: No organomegaly, abdomen is soft and non-tender Skin: No lesions in the area of chief complaint other than those listed below in MSK exam.  Neurologic: Sensation intact distally save for the below mentioned MSK exam Psychiatric: Patient is competent for consent with normal mood and affect Lymphatic: No swelling obvious and reported other than the area involved in the exam below Extremities  RUE: Distal AIN/PIN/ulnar motor function is intact, unable check skin in setting of splint, no range of motion the setting of known fracture, warm well-perfused hand with good pulses.    Test Results Imaging X-ray and CT reviewed.  Demonstrate comminuted fracture of the radial head with interposition of 1 of the bone fragments causing a subluxation of the joint.  There is a coronoid fracture as well and a presumed lateral ligament injury.  Labs cbc Recent Labs    06/16/18 1742  WBC 12.7*  HGB 15.3*  HCT 50.4*  PLT 344    Labs inflam No results for input(s): CRP in the last 72 hours.  Invalid input(s): ESR  Labs coag Recent Labs    06/16/18 1742 06/17/18 0254  INR 1.7* 1.3*    Recent Labs    06/16/18 1742  NA 138  K 3.9  CL 105  CO2 23  GLUCOSE 124*  BUN 35*  CREATININE 1.15*  CALCIUM 10.3      ASSESSMENT AND PLAN: 68 y.o. female with the following: Right terrible triad injury  Talk to the patient at length about the injury she has.  Her joint is unstable and would benefit from surgery.  We talked her risk benefits and alternatives of this at length.  She understands the specific risks of infection, hardware failure, arthrosis, dislocation and instability as well as the need for further surgery.  If she is unable to have a stable joint after radial head replacement, coronoid fracture and ligament repair we may actually have to talk about doing a external fixator.  She understands this.  She agreed to proceed with surgery.  She will discharge postop.

## 2018-06-17 NOTE — Progress Notes (Signed)
Reviewed discharge paperwork and medication with full understanding

## 2018-06-17 NOTE — Anesthesia Procedure Notes (Signed)
Anesthesia Regional Block: Supraclavicular block   Pre-Anesthetic Checklist: ,, timeout performed, Correct Patient, Correct Site, Correct Laterality, Correct Procedure, Correct Position, site marked, Risks and benefits discussed, pre-op evaluation,  At surgeon's request and post-op pain management  Laterality: Right  Prep: Maximum Sterile Barrier Precautions used, chloraprep       Needles:  Injection technique: Single-shot  Needle Type: Echogenic Stimulator Needle     Needle Length: 5cm  Needle Gauge: 22     Additional Needles:   Procedures:,,,, ultrasound used (permanent image in chart),,,,  Narrative:  Start time: 06/17/2018 7:25 AM End time: 06/17/2018 7:35 AM Injection made incrementally with aspirations every 5 mL. Anesthesiologist: Gaynelle Adu, MD  Additional Notes: 2% Lidocaine skin wheel.

## 2018-06-17 NOTE — Plan of Care (Signed)

## 2018-06-17 NOTE — Anesthesia Postprocedure Evaluation (Signed)
Anesthesia Post Note  Patient: [name]  Procedure(s) Performed: RADIAL HEAD ARTHROPLASTY (Right )     Patient location during evaluation: PACU Anesthesia Type: General and Regional Level of consciousness: awake and alert Pain management: pain level controlled Vital Signs Assessment: post-procedure vital signs reviewed and stable Respiratory status: spontaneous breathing, nonlabored ventilation, respiratory function stable and patient connected to nasal cannula oxygen Cardiovascular status: blood pressure returned to baseline and stable Postop Assessment: no apparent nausea or vomiting Anesthetic complications: no    Last Vitals:  Vitals:   06/17/18 1110 06/17/18 1120  BP: (!) 144/49   Pulse: 73   Resp: 18   Temp:    SpO2: 99% 95%    Last Pain:  Vitals:   06/17/18 1055  TempSrc:   PainSc: 0-No pain                 Kiandria Clum,W. EDMOND

## 2018-06-17 NOTE — Anesthesia Preprocedure Evaluation (Addendum)
Anesthesia Evaluation  Patient identified by MRN, date of birth, ID band Patient awake    Reviewed: Allergy & Precautions, H&P , NPO status , Patient's Chart, lab work & pertinent test results, reviewed documented beta blocker date and time   History of Anesthesia Complications (+) PONV  Airway Mallampati: III  TM Distance: >3 FB Neck ROM: Full  Mouth opening: Limited Mouth Opening  Dental no notable dental hx. (+) Teeth Intact, Dental Advisory Given   Pulmonary sleep apnea and Continuous Positive Airway Pressure Ventilation , former smoker,    Pulmonary exam normal breath sounds clear to auscultation       Cardiovascular hypertension, Pt. on medications and Pt. on home beta blockers + dysrhythmias Atrial Fibrillation  Rhythm:Regular Rate:Normal     Neuro/Psych Depression negative neurological ROS     GI/Hepatic Neg liver ROS, GERD  Medicated and Controlled,  Endo/Other  diabetes, Insulin DependentHypothyroidism   Renal/GU Renal InsufficiencyRenal disease  negative genitourinary   Musculoskeletal  (+) Arthritis , Osteoarthritis,    Abdominal   Peds  Hematology negative hematology ROS (+)   Anesthesia Other Findings   Reproductive/Obstetrics negative OB ROS                          Anesthesia Physical Anesthesia Plan  ASA: III  Anesthesia Plan: General   Post-op Pain Management:  Regional for Post-op pain   Induction: Intravenous  PONV Risk Score and Plan: 4 or greater and Ondansetron, Midazolam and Treatment may vary due to age or medical condition  Airway Management Planned: Oral ETT  Additional Equipment:   Intra-op Plan:   Post-operative Plan: Extubation in OR  Informed Consent: I have reviewed the patients History and Physical, chart, labs and discussed the procedure including the risks, benefits and alternatives for the proposed anesthesia with the patient or authorized  representative who has indicated his/her understanding and acceptance.     Dental advisory given  Plan Discussed with: CRNA  Anesthesia Plan Comments:         Anesthesia Quick Evaluation

## 2018-06-17 NOTE — Anesthesia Procedure Notes (Signed)
Procedure Name: Intubation Date/Time: 06/17/2018 8:04 AM Performed by: Nils Pyle, CRNA Pre-anesthesia Checklist: Patient identified, Emergency Drugs available, Suction available and Patient being monitored Patient Re-evaluated:Patient Re-evaluated prior to induction Oxygen Delivery Method: Circle System Utilized Preoxygenation: Pre-oxygenation with 100% oxygen Induction Type: IV induction, Rapid sequence and Cricoid Pressure applied Laryngoscope Size: Miller and 2 Grade View: Grade I Tube type: Oral Tube size: 7.0 mm Number of attempts: 1 Airway Equipment and Method: Stylet and Oral airway Placement Confirmation: ETT inserted through vocal cords under direct vision,  positive ETCO2 and breath sounds checked- equal and bilateral Secured at: 22 cm Tube secured with: Tape Dental Injury: Teeth and Oropharynx as per pre-operative assessment

## 2018-06-17 NOTE — Op Note (Signed)
Orthopaedic Surgery Operative Note (CSN: [id number])  [name]  09/16/1950 Date of Surgery: 06/16/2018 - 06/17/2018   Diagnoses:  Right terrible triad elbow fracture dislocation  Procedure: Right radial head replacement 06004 Right lateral ligament repair [phone] Reduction internal fixation of coronoid fracture with suture fixation 24685 modifier 22 due to complexity due to patient super morbid obesity and arm size of nearly 40 cm. 3 view interpretation XR R elbow [id number]   Operative Finding Successful completion of planned procedure.  Patient had an extremely large arm in the setting of super morbid obesity with a BMI of 45 based on recent clinical notes.  Her upper arm circumference was 40 cm which made the case extremely difficult.  We had good fixation and fit of her radial head and a robust repair of the lateral ligaments.  The coronoid fragment was not large enough to really be fixable with hardware and a suture based repair was done with good fixation.  Implants: Zimmer L to L radial head system 7 mm stem +2, 20 mm diameter head, 10 mm height.  Jugular knot rigid size 1 and size 2 one of each.  Post-operative plan: The patient will be nonweightbearing in a splint for 7 to 10 days.  The patient will be discharged home.  DVT prophylaxis not indicated in ambulatory upper extremity isolated patient.  Pain control with PRN pain medication preferring oral medicines.  Follow up plan will be scheduled in approximately 7 days for incision check and XR.  Post-Op Diagnosis: Same Surgeons:Primary: Bjorn Pippin, MD Assistants: Janace Litten, OPAC Location: [facility] OR ROOM 08 Anesthesia: Regional Antibiotics: Ancef 2g preop, Vancomycin 1000mg  locally  Tourniquet time: 80 Estimated Blood Loss: Minimal Complications: None Specimens: None Implants: Implant Name Type Inv. Item Serial No. Manufacturer Lot No. LRB No. Used Action  Radial stem component   [id number]  [id number] Right 1 Implanted  HEAD  RADIAL IMPL 20 - [id number] Joint HEAD RADIAL IMPL 20 [id number] ZIMMER [employer](ORTH,TRAU,BIO,SG) [id number] Right 1 Implanted  ANCHOR SUTURE 1.45 SZ 1 SHORT - [id number] Anchor ANCHOR SUTURE 1.45 SZ 1 SHORT [id number] ZIMMER [employer](ORTH,TRAU,BIO,SG) [id number] Right 1 Implanted    Indications for Surgery:   [name] is a 68 y.o. female with fall resulting in a comminuted terrible triad injury of the right elbow with a fracture dislocation.  [name] long team was not able to comfortably take care of patient due to complexity of fracture and I was consulted for management.  The patient's fracture dislocation without operative fixation would be appropriate.  Benefits and risks of operative and nonoperative management were discussed prior to surgery with patient/guardian(s) and informed consent form was completed.  Specific risks including infection, need for additional surgery, dislocation, loss of fixation, need for revision surgery, hardware based pain, infection and stiffness.   Procedure:   The patient was identified in the preoperative holding area where the surgical site was marked. The patient was taken to the OR where a procedural timeout was called and the above noted anesthesia was induced.  The patient was positioned prone on a hand table.  Preoperative antibiotics were dosed.  The patient's right elbow was prepped and draped in the usual sterile fashion.  A second preoperative timeout was called.      A tourniquet was used for the above listed time.  We began with a lateral approach to the elbow.  We used fluoroscopy as well as landmarks to identify the lateral epicondyle and made a 8 cm incision overlying this  area.  We went through skin sharply achieving hemostasis we progressed.  We were able to identify the fascia overlying the lateral side of the elbow as well as the epicondyle.  That point made a longitudinal incision in line with the fibers of the underlying LCL and opened the superficial  fascia.  The LCL was completely avulsed from the lateral epicondyle.  We raised these flaps and were able to identify the comminuted radial head fracture.  This was not amenable to repair.  We removed the loose fragments as well as other loose fragments from the medial side of the joint and then did a osteotomy of the radial neck 10 mm from the tip of the radial head.  Protected the radial neck during this with Hohmann retractors.  This point we reached the radial neck and sized it with the Zimmer L2L system.  Implant sizes above.  Once we had good fit we sized the native radial head and found that a 22 size was relatively tight and a 24 was relatively loose for the native pieces.  We selected a 20 sized implant which seemed on fluoroscopy to fit well.  Once we checked all of this we were happy with our sizing we progressed to the coronoid portion of the fixation.  We looked at the coronoid carefully as the joint was very unstable we are able to identify it from the lateral incision through the resected radial head.  It was a small piece less than a centimeter in size and was really not amenable to hardware based fixation.  We did feel that it was important to reduce this as it was large enough to cause continued instability.  We then proceeded to do a soft tissue suture based ORIF.  We cleared the fracture bed to promote good healing.  We then identified the brachialis attachment to the coronoid fragment and passed a #2 maxbraid suture.  In a figure-of-eight fashion through this with limbs exiting medial and lateral to the coronoid.  At that point we used an Arthrex ACL guide to make bone tunnels through the olecranon both at the medial and lateral aspect of the insertion of the coronoid fragment and used a [place] suture passer to shuttle the #2 sutures through the olecranon.  We checked fluoroscopy to note that our reduction of this fragment was appropriate.  Did not tie the sutures at this point.  This  point we irrigated copiously and placed some local vancomycin deep inside our joint.  We then placed our final implants with the radial head.  The joint was reduced and the annular ligament was repaired in a figure-of-eight fashion with multiple interrupted #2 max braid sutures.  The joint was already much more stable just with an annular ligament repair.    We then proceeded to repair the lateral collateral ligaments.  2 juggernaut anchors as listed above were placed into the lateral epicondyle at a 45 degree angle to the line of pull to avoid pull out.  These were set as is typical in the technique guide and identified that they would not pull out of bone.  We then used both anchors individually to repair the lateral ligament structures 1 as a backup to the other.  We did this with Krakw types passes through the lateral ligaments and reduce the ligaments to bone by pulling on the loose end of the suture.  We had a robust repair.    We proceeded to tie her coronoid fracture fragments through our  accessory incision over the olecranon.  Final fluoroscopic images demonstrated an appropriate coronoid reduction in the anatomic joint reduction with good stability through live fluoroscopy range of motion.  A running suture was placed in the fascia.  The incision was thoroughly irrigated and closed in a multilayer fashion with absorbable sutures.  Local vancomycin was placed.  A sterile dressing was placed.  A well-padded splint was placed.  The patient was awoken from general anesthesia and taken to the PACU in stable condition without complication.   Janace Litten, OPA-C, present and scrubbed throughout the case, critical for completion in a timely fashion, and for retraction, instrumentation, closure.  3 views of the elbow reviewed in the included pictures are in this note that demonstrated anatomic reduction of the joint and the coronoid with good size of the radial head prosthesis and no other acute  osseous abnormalities.

## 2018-06-18 ENCOUNTER — Encounter (HOSPITAL_COMMUNITY): Payer: Self-pay | Admitting: Orthopaedic Surgery

## 2018-06-18 LAB — GLUCOSE, CAPILLARY: Glucose-Capillary: 178 mg/dL — ABNORMAL HIGH (ref 70–99)

## 2018-06-19 DIAGNOSIS — S52121D Displaced fracture of head of right radius, subsequent encounter for closed fracture with routine healing: Secondary | ICD-10-CM | POA: Diagnosis not present

## 2018-06-21 DIAGNOSIS — S52121D Displaced fracture of head of right radius, subsequent encounter for closed fracture with routine healing: Secondary | ICD-10-CM | POA: Diagnosis not present

## 2018-06-25 NOTE — Discharge Summary (Signed)
Patient ID: [name] MRN: [id number] DOB/AGE: [birth year] 70 y.o.  Admit date: 06/16/2018 Discharge date: 06/25/2018  Admission Diagnoses:Right terrible triad injury elbow  Discharge Diagnoses:  Active Problems:   Radial head fracture, closed   Closed fracture dislocation of elbow   Past Medical History:  Diagnosis Date  . A-fib (HCC)   . Anemia    hx of  . Arthritis   . Chest pain   . Constipation   . Depression   . Diabetes mellitus ORAL MEDS  . Diabetic retinopathy (HCC)   . Dyspnea    with minimal exertion-deconditioned  . Dyspnea   . Dysrhythmia    a-fib  . Food allergy   . GERD (gastroesophageal reflux disease)    occasional  . Gout   . History of kidney stones   . Hypercholesteremia   . Hyperlipidemia   . Hypertension   . Hypertensive kidney disease   . Hypothyroidism   . Knee pain, right   . 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 Obesity   . OSA on CPAP    uses CPAP nightly  . Osteoarthritis   . Palpitations   . Pinched nerve in neck   . Pleurisy   . Pneumonia    hx of  . PONV (postoperative nausea and vomiting)    for 3-4 days after general anesthesia  . Sciatica   . Swelling of knee joint, right   . Synovitis of knee RIGHT  . Trigger finger, left    left index  . Vitamin D deficiency      Procedures Performed: Right coronoid ORIF, lateral ligament repair, radial head arthroplasty  Discharged Condition: good  Hospital Course: Patient brought in from [place] ER for surgery due to her elbow subluxation and fracture.  Tolerated procedure well.  Was DC same day as procedure as she was doing well, pain controlled.  Patient was instructed on specific activity restrictions and all questions were answered.   Consults: None  Significant Diagnostic Studies: No additional pertinent studies  Treatments: Surgery  Disposition:   Discharge Instructions    Call MD for:  persistant nausea and vomiting   Complete by:  As  directed    Call MD for:  redness, tenderness, or signs of infection (pain, swelling, redness, odor or green/yellow discharge around incision site)   Complete by:  As directed    Call MD for:  severe uncontrolled pain   Complete by:  As directed    Diet - low sodium heart healthy   Complete by:  As directed    Discharge instructions   Complete by:  As directed    Ramond Marrow MD, MPH [facility] [address], [address] [phone] (tel)   [phone] (fax)   POST-OPERATIVE INSTRUCTIONS   WOUND CARE Please keep splint clean dry and intact until followup.  You may shower on Post-Op Day #2. You must keep splint dry during this process and may find that a plastic bag taped around the extremity or alternatively a towel based bath may be a better option.  If you get your splint wet or if it is damaged please contact our clinic.  EXERCISES Due to your splint being in place you will not be able to bear weight through your extremity.   Please use your sling until follow-up. Please continue to work on range of motion of your fingers and stretch these multiple times a day to prevent stiffness.  POST-OP MEDICINES  A multi-modal approach will be used to treat your pain. Oxycodone - This is a strong narcotic, to be used only on an "as needed" basis for pain. Acetaminophen 500mg - A non-narcotic pain medicine.  Use 1000mg  three times a day for the first 14 days after surgery   Zofran 4mg  - This is an anti-nausea medicine to be used only if you are having nausea or vomitting. If you have any adverse effects with the medications, please call our office.  FOLLOW-UP If you develop a Fever (>101.5), Redness or Drainage from the surgical incision site, please call our office to arrange for an evaluation. Please call the office to schedule a follow-up appointment for your suture removal, 10-14 days post-operatively.  IF YOU HAVE ANY QUESTIONS, PLEASE FEEL FREE TO CALL OUR  OFFICE.  HELPFUL INFORMATION  Your arm will be in a sling following surgery. You will be in this sling for the next 4-6 weeks.  I will let you know the exact duration at your follow-up visit. You may walk and ambulate without restrictions.  You should wean off your narcotic medicines as soon as you are able.  Most patients will be off or using minimal narcotics before their first postop appointment.    You may be more comfortable sleeping in a semi-seated position the first few nights following surgery.  Keep a pillow propped under the elbow and forearm for comfort.  If you have a recliner type of chair it might be beneficial.    We suggest you use the pain medication the first night prior to going to bed, in order to ease any pain when the anesthesia wears off. You should avoid taking pain medications on an empty stomach as it will make you nauseous.  Do not drink alcoholic beverages or take illicit drugs when taking pain medications.  In most states it is against the law to drive while your arm is in a sling. And certainly against the law to drive while taking narcotics.  You may return to work/school in the next couple of days when you feel up to it. Desk work and typing in the sling is fine.  When dressing, put your operative arm in the sleeve first.  When getting undressed, take your operative arm out last.  Loose fitting, button-down shirts are recommended.  Often in the first days after surgery you may be more comfortable keeping your operative arm under your shirt and not through the sleeve.  Pain medication may make you constipated.  Below are a few solutions to try in this order: Decrease the amount of pain medication if you aren't having pain. Drink lots of decaffeinated fluids. Drink prune juice and/or each dried prunes  If the first 3 don't work start with additional solutions Take Colace - an over-the-counter stool softener Take Senokot - an over-the-counter laxative Take  Miralax - a stronger over-the-counter laxative   Increase activity slowly   Complete by:  As directed      Allergies as of 06/17/2018      Reactions   Actos [pioglitazone Hydrochloride] Swelling, Other (See Comments)   Numbness in lips, HEADACHES SWELLING REACTION UNSPECIFIED    Avandia [rosiglitazone] Swelling   Numbness in lips, headaches  SWELLING REACTION UNSPECIFIED    Tetanus Toxoids Other (See Comments)   Arm swelling, fainted, ?Respiratory distress  (horse serum)   Food Nausea Only   Eggplant      Medication List    STOP taking these medications   HYDROcodone-acetaminophen 5-325 MG  tablet Commonly known as:  NORCO/VICODIN     TAKE these medications   acetaminophen 500 MG tablet Commonly known as:  TYLENOL Take 2 tablets (1,000 mg total) by mouth every 8 (eight) hours for 14 days.   amLODipine 5 MG tablet Commonly known as:  NORVASC Take 5 mg by mouth every evening.   atorvastatin 40 MG tablet Commonly known as:  LIPITOR Take 40 mg by mouth every evening.   Calcium 600 600 MG Tabs tablet Generic drug:  calcium carbonate Take 600 mg by mouth daily with breakfast.   celecoxib 100 MG capsule Commonly known as:  CeleBREX Take 1 capsule (100 mg total) by mouth 2 (two) times daily.   cholecalciferol 25 MCG (1000 UT) tablet Commonly known as:  VITAMIN D3 Take 1,000 Units by mouth daily.   DULoxetine 60 MG capsule Commonly known as:  CYMBALTA Take 60 mg by mouth daily.   flecainide 50 MG tablet Commonly known as:  TAMBOCOR Take 1 tablet (50 mg total) by mouth 2 (two) times daily. Please keep appt in March for future refills. Thank you.   hydrochlorothiazide 12.5 MG capsule Commonly known as:  MICROZIDE Take 12.5 mg by mouth daily.   insulin glargine 100 UNIT/ML injection Commonly known as:  LANTUS Inject 40 Units into the skin 2 (two) times daily.   levothyroxine 125 MCG tablet Commonly known as:  SYNTHROID, LEVOTHROID Take 125 mcg by mouth daily  before breakfast.   Melatonin 5 MG Caps Take 5-10 mg by mouth at bedtime as needed (sleep).   metFORMIN 500 MG 24 hr tablet Commonly known as:  GLUCOPHAGE-XR Take 1,000 mg by mouth daily with supper.   methocarbamol 500 MG tablet Commonly known as:  ROBAXIN Take 1 tablet (500 mg total) by mouth every 6 (six) hours as needed for muscle spasms.   metoprolol tartrate 50 MG tablet Commonly known as:  LOPRESSOR Take 1 tablet (50 mg total) by mouth 2 (two) times daily.   omeprazole 20 MG capsule Commonly known as:  PRILOSEC Take 20 mg by mouth daily.   rivaroxaban 20 MG Tabs tablet Commonly known as:  Xarelto TAKE 1 TABLET BY MOUTH ONCE DAILY WITH SUPPER What changed:    how much to take  how to take this  when to take this   Trulicity 1.5 MG/0.5ML Sopn Generic drug:  Dulaglutide Inject 1.5 mg as directed every Sunday.   valsartan 320 MG tablet Commonly known as:  DIOVAN Take 320 mg by mouth daily.     ASK your doctor about these medications   ondansetron 4 MG tablet Commonly known as:  Zofran Take 1 tablet (4 mg total) by mouth every 8 (eight) hours as needed for up to 7 days for nausea or vomiting. Ask about: Should I take this medication?   oxyCODONE 5 MG immediate release tablet Commonly known as:  Oxy IR/ROXICODONE Take 1-2 pills every 6 hrs as needed for pain, no more than 6 per day Ask about: Should I take this medication?

## 2018-06-28 DIAGNOSIS — M25541 Pain in joints of right hand: Secondary | ICD-10-CM | POA: Diagnosis not present

## 2018-06-28 DIAGNOSIS — M25321 Other instability, right elbow: Secondary | ICD-10-CM | POA: Diagnosis not present

## 2018-06-28 DIAGNOSIS — M25531 Pain in right wrist: Secondary | ICD-10-CM | POA: Diagnosis not present

## 2018-06-28 DIAGNOSIS — M25521 Pain in right elbow: Secondary | ICD-10-CM | POA: Diagnosis not present

## 2018-06-28 DIAGNOSIS — S52121D Displaced fracture of head of right radius, subsequent encounter for closed fracture with routine healing: Secondary | ICD-10-CM | POA: Diagnosis not present

## 2018-07-03 DIAGNOSIS — M25531 Pain in right wrist: Secondary | ICD-10-CM | POA: Diagnosis not present

## 2018-07-03 DIAGNOSIS — M25541 Pain in joints of right hand: Secondary | ICD-10-CM | POA: Diagnosis not present

## 2018-07-03 DIAGNOSIS — M25521 Pain in right elbow: Secondary | ICD-10-CM | POA: Diagnosis not present

## 2018-07-03 DIAGNOSIS — M25321 Other instability, right elbow: Secondary | ICD-10-CM | POA: Diagnosis not present

## 2018-07-04 MED FILL — TRULICITY 1.5 MG/0.5 ML PEN: 1.5 | 28 days supply | Qty: 2 | Fill #1

## 2018-07-04 MED FILL — DULOXETINE HCL 60 MG CPEP: 60 | 30 days supply | Qty: 30 | Fill #0

## 2018-07-04 MED FILL — OMEPRAZOLE 20 MG CPDR: 20 | 90 days supply | Qty: 90 | Fill #3

## 2018-07-04 MED FILL — LANTUS 100 UNITS/ML VIAL: 100 | 30 days supply | Qty: 20 | Fill #2

## 2018-07-05 DIAGNOSIS — M25531 Pain in right wrist: Secondary | ICD-10-CM | POA: Diagnosis not present

## 2018-07-05 DIAGNOSIS — M6281 Muscle weakness (generalized): Secondary | ICD-10-CM | POA: Diagnosis not present

## 2018-07-05 DIAGNOSIS — M25321 Other instability, right elbow: Secondary | ICD-10-CM | POA: Diagnosis not present

## 2018-07-05 DIAGNOSIS — M25541 Pain in joints of right hand: Secondary | ICD-10-CM | POA: Diagnosis not present

## 2018-07-05 DIAGNOSIS — S53006S Unspecified dislocation of unspecified radial head, sequela: Secondary | ICD-10-CM | POA: Diagnosis not present

## 2018-07-05 DIAGNOSIS — M25521 Pain in right elbow: Secondary | ICD-10-CM | POA: Diagnosis not present

## 2018-07-05 DIAGNOSIS — M25611 Stiffness of right shoulder, not elsewhere classified: Secondary | ICD-10-CM | POA: Diagnosis not present

## 2018-07-05 DIAGNOSIS — M25621 Stiffness of right elbow, not elsewhere classified: Secondary | ICD-10-CM | POA: Diagnosis not present

## 2018-07-10 DIAGNOSIS — M25531 Pain in right wrist: Secondary | ICD-10-CM | POA: Diagnosis not present

## 2018-07-10 DIAGNOSIS — M25541 Pain in joints of right hand: Secondary | ICD-10-CM | POA: Diagnosis not present

## 2018-07-10 DIAGNOSIS — M25611 Stiffness of right shoulder, not elsewhere classified: Secondary | ICD-10-CM | POA: Diagnosis not present

## 2018-07-10 DIAGNOSIS — M25321 Other instability, right elbow: Secondary | ICD-10-CM | POA: Diagnosis not present

## 2018-07-10 DIAGNOSIS — M25521 Pain in right elbow: Secondary | ICD-10-CM | POA: Diagnosis not present

## 2018-07-10 DIAGNOSIS — M6281 Muscle weakness (generalized): Secondary | ICD-10-CM | POA: Diagnosis not present

## 2018-07-10 DIAGNOSIS — S53006S Unspecified dislocation of unspecified radial head, sequela: Secondary | ICD-10-CM | POA: Diagnosis not present

## 2018-07-10 DIAGNOSIS — M25621 Stiffness of right elbow, not elsewhere classified: Secondary | ICD-10-CM | POA: Diagnosis not present

## 2018-07-12 DIAGNOSIS — M25521 Pain in right elbow: Secondary | ICD-10-CM | POA: Diagnosis not present

## 2018-07-12 DIAGNOSIS — M25321 Other instability, right elbow: Secondary | ICD-10-CM | POA: Diagnosis not present

## 2018-07-12 DIAGNOSIS — M6281 Muscle weakness (generalized): Secondary | ICD-10-CM | POA: Diagnosis not present

## 2018-07-12 DIAGNOSIS — M25611 Stiffness of right shoulder, not elsewhere classified: Secondary | ICD-10-CM | POA: Diagnosis not present

## 2018-07-12 DIAGNOSIS — M25531 Pain in right wrist: Secondary | ICD-10-CM | POA: Diagnosis not present

## 2018-07-12 DIAGNOSIS — S53006S Unspecified dislocation of unspecified radial head, sequela: Secondary | ICD-10-CM | POA: Diagnosis not present

## 2018-07-12 DIAGNOSIS — M25621 Stiffness of right elbow, not elsewhere classified: Secondary | ICD-10-CM | POA: Diagnosis not present

## 2018-07-12 DIAGNOSIS — M25541 Pain in joints of right hand: Secondary | ICD-10-CM | POA: Diagnosis not present

## 2018-07-17 DIAGNOSIS — M25541 Pain in joints of right hand: Secondary | ICD-10-CM | POA: Diagnosis not present

## 2018-07-17 DIAGNOSIS — M25531 Pain in right wrist: Secondary | ICD-10-CM | POA: Diagnosis not present

## 2018-07-17 DIAGNOSIS — M25521 Pain in right elbow: Secondary | ICD-10-CM | POA: Diagnosis not present

## 2018-07-17 DIAGNOSIS — M6281 Muscle weakness (generalized): Secondary | ICD-10-CM | POA: Diagnosis not present

## 2018-07-17 DIAGNOSIS — M25321 Other instability, right elbow: Secondary | ICD-10-CM | POA: Diagnosis not present

## 2018-07-17 DIAGNOSIS — M25621 Stiffness of right elbow, not elsewhere classified: Secondary | ICD-10-CM | POA: Diagnosis not present

## 2018-07-17 DIAGNOSIS — S53006S Unspecified dislocation of unspecified radial head, sequela: Secondary | ICD-10-CM | POA: Diagnosis not present

## 2018-07-17 DIAGNOSIS — M25611 Stiffness of right shoulder, not elsewhere classified: Secondary | ICD-10-CM | POA: Diagnosis not present

## 2018-07-19 DIAGNOSIS — M25541 Pain in joints of right hand: Secondary | ICD-10-CM | POA: Diagnosis not present

## 2018-07-19 DIAGNOSIS — M6281 Muscle weakness (generalized): Secondary | ICD-10-CM | POA: Diagnosis not present

## 2018-07-19 DIAGNOSIS — M25521 Pain in right elbow: Secondary | ICD-10-CM | POA: Diagnosis not present

## 2018-07-19 DIAGNOSIS — S53006S Unspecified dislocation of unspecified radial head, sequela: Secondary | ICD-10-CM | POA: Diagnosis not present

## 2018-07-19 DIAGNOSIS — M25531 Pain in right wrist: Secondary | ICD-10-CM | POA: Diagnosis not present

## 2018-07-19 DIAGNOSIS — M25321 Other instability, right elbow: Secondary | ICD-10-CM | POA: Diagnosis not present

## 2018-07-19 DIAGNOSIS — M25611 Stiffness of right shoulder, not elsewhere classified: Secondary | ICD-10-CM | POA: Diagnosis not present

## 2018-07-19 DIAGNOSIS — M25621 Stiffness of right elbow, not elsewhere classified: Secondary | ICD-10-CM | POA: Diagnosis not present

## 2018-07-24 DIAGNOSIS — M25521 Pain in right elbow: Secondary | ICD-10-CM | POA: Diagnosis not present

## 2018-07-24 DIAGNOSIS — M25621 Stiffness of right elbow, not elsewhere classified: Secondary | ICD-10-CM | POA: Diagnosis not present

## 2018-07-24 DIAGNOSIS — M25531 Pain in right wrist: Secondary | ICD-10-CM | POA: Diagnosis not present

## 2018-07-24 DIAGNOSIS — M25321 Other instability, right elbow: Secondary | ICD-10-CM | POA: Diagnosis not present

## 2018-07-24 DIAGNOSIS — M25611 Stiffness of right shoulder, not elsewhere classified: Secondary | ICD-10-CM | POA: Diagnosis not present

## 2018-07-24 DIAGNOSIS — M25541 Pain in joints of right hand: Secondary | ICD-10-CM | POA: Diagnosis not present

## 2018-07-24 DIAGNOSIS — S53006S Unspecified dislocation of unspecified radial head, sequela: Secondary | ICD-10-CM | POA: Diagnosis not present

## 2018-07-24 DIAGNOSIS — M6281 Muscle weakness (generalized): Secondary | ICD-10-CM | POA: Diagnosis not present

## 2018-07-26 DIAGNOSIS — M25531 Pain in right wrist: Secondary | ICD-10-CM | POA: Diagnosis not present

## 2018-07-26 DIAGNOSIS — M25621 Stiffness of right elbow, not elsewhere classified: Secondary | ICD-10-CM | POA: Diagnosis not present

## 2018-07-26 DIAGNOSIS — M25541 Pain in joints of right hand: Secondary | ICD-10-CM | POA: Diagnosis not present

## 2018-07-26 DIAGNOSIS — M25611 Stiffness of right shoulder, not elsewhere classified: Secondary | ICD-10-CM | POA: Diagnosis not present

## 2018-07-26 DIAGNOSIS — M25321 Other instability, right elbow: Secondary | ICD-10-CM | POA: Diagnosis not present

## 2018-07-26 DIAGNOSIS — S53006S Unspecified dislocation of unspecified radial head, sequela: Secondary | ICD-10-CM | POA: Diagnosis not present

## 2018-07-26 DIAGNOSIS — M6281 Muscle weakness (generalized): Secondary | ICD-10-CM | POA: Diagnosis not present

## 2018-07-26 DIAGNOSIS — M25521 Pain in right elbow: Secondary | ICD-10-CM | POA: Diagnosis not present

## 2018-07-31 DIAGNOSIS — M6281 Muscle weakness (generalized): Secondary | ICD-10-CM | POA: Diagnosis not present

## 2018-07-31 DIAGNOSIS — M25621 Stiffness of right elbow, not elsewhere classified: Secondary | ICD-10-CM | POA: Diagnosis not present

## 2018-07-31 DIAGNOSIS — M25611 Stiffness of right shoulder, not elsewhere classified: Secondary | ICD-10-CM | POA: Diagnosis not present

## 2018-07-31 DIAGNOSIS — M25531 Pain in right wrist: Secondary | ICD-10-CM | POA: Diagnosis not present

## 2018-07-31 DIAGNOSIS — S53006S Unspecified dislocation of unspecified radial head, sequela: Secondary | ICD-10-CM | POA: Diagnosis not present

## 2018-07-31 DIAGNOSIS — M25521 Pain in right elbow: Secondary | ICD-10-CM | POA: Diagnosis not present

## 2018-07-31 DIAGNOSIS — M25321 Other instability, right elbow: Secondary | ICD-10-CM | POA: Diagnosis not present

## 2018-07-31 DIAGNOSIS — M25541 Pain in joints of right hand: Secondary | ICD-10-CM | POA: Diagnosis not present

## 2018-08-01 MED FILL — LEVOTHYROXINE 125 MCG TABLE: 125 | 30 days supply | Qty: 30 | Fill #2

## 2018-08-02 DIAGNOSIS — M25321 Other instability, right elbow: Secondary | ICD-10-CM | POA: Diagnosis not present

## 2018-08-02 DIAGNOSIS — M25531 Pain in right wrist: Secondary | ICD-10-CM | POA: Diagnosis not present

## 2018-08-02 DIAGNOSIS — M25621 Stiffness of right elbow, not elsewhere classified: Secondary | ICD-10-CM | POA: Diagnosis not present

## 2018-08-02 DIAGNOSIS — M6281 Muscle weakness (generalized): Secondary | ICD-10-CM | POA: Diagnosis not present

## 2018-08-02 DIAGNOSIS — M25521 Pain in right elbow: Secondary | ICD-10-CM | POA: Diagnosis not present

## 2018-08-02 DIAGNOSIS — M25541 Pain in joints of right hand: Secondary | ICD-10-CM | POA: Diagnosis not present

## 2018-08-02 DIAGNOSIS — S52121D Displaced fracture of head of right radius, subsequent encounter for closed fracture with routine healing: Secondary | ICD-10-CM | POA: Diagnosis not present

## 2018-08-02 DIAGNOSIS — M25611 Stiffness of right shoulder, not elsewhere classified: Secondary | ICD-10-CM | POA: Diagnosis not present

## 2018-08-02 DIAGNOSIS — S53006S Unspecified dislocation of unspecified radial head, sequela: Secondary | ICD-10-CM | POA: Diagnosis not present

## 2018-08-07 DIAGNOSIS — E113511 Type 2 diabetes mellitus with proliferative diabetic retinopathy with macular edema, right eye: Secondary | ICD-10-CM | POA: Diagnosis not present

## 2018-08-08 ENCOUNTER — Other Ambulatory Visit: Payer: Self-pay

## 2018-08-08 ENCOUNTER — Ambulatory Visit: Payer: 59 | Attending: Orthopaedic Surgery

## 2018-08-08 DIAGNOSIS — R600 Localized edema: Secondary | ICD-10-CM | POA: Insufficient documentation

## 2018-08-08 DIAGNOSIS — M6281 Muscle weakness (generalized): Secondary | ICD-10-CM | POA: Diagnosis not present

## 2018-08-08 DIAGNOSIS — M25521 Pain in right elbow: Secondary | ICD-10-CM | POA: Diagnosis not present

## 2018-08-08 DIAGNOSIS — M25621 Stiffness of right elbow, not elsewhere classified: Secondary | ICD-10-CM | POA: Insufficient documentation

## 2018-08-08 NOTE — Therapy (Signed)
[facility] [address], [address] [place], [place], [address] Phone: [phone]   Fax:  [phone]  Physical Therapy Evaluation  Patient Details  Name: [name] MRN: [id number] Date of Birth: [birth year] Referring Provider (PT): Ramond Marrow, MD   Encounter Date: 08/08/2018  PT End of Session - 08/08/18 0941    Visit Number  1    Date for PT Re-Evaluation  10/03/18    Authorization Type  UMR    PT Start Time  0905    PT Stop Time  0946    PT Time Calculation (min)  41 min    Activity Tolerance  Patient tolerated treatment well    Behavior During Therapy  [facility] for tasks assessed/performed       Past Medical History:  Diagnosis Date  . A-fib (HCC)   . Anemia    hx of  . Arthritis   . Chest pain   . Constipation   . Depression   . Diabetes mellitus ORAL MEDS  . Diabetic retinopathy (HCC)   . Dyspnea    with minimal exertion-deconditioned  . Dyspnea   . Dysrhythmia    a-fib  . Food allergy   . GERD (gastroesophageal reflux disease)    occasional  . Gout   . History of kidney stones   . Hypercholesteremia   . Hyperlipidemia   . Hypertension   . Hypertensive kidney disease   . Hypothyroidism   . Knee pain, right   . 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 Obesity   . OSA on CPAP    uses CPAP nightly  . Osteoarthritis   . Palpitations   . Pinched nerve in neck   . Pleurisy   . Pneumonia    hx of  . PONV (postoperative nausea and vomiting)    for 3-4 days after general anesthesia  . Sciatica   . Swelling of knee joint, right   . Synovitis of knee RIGHT  . Trigger finger, left    left index  . Vitamin D deficiency     Past Surgical History:  Procedure Laterality Date  . CESAREAN SECTION  X3  . KNEE ARTHROSCOPY  04/20/2011   Procedure: ARTHROSCOPY KNEE;  Surgeon: Loanne Drilling, MD;  Location: [facility];  Service: Orthopedics;  Laterality: Right;  WITH SYNOVECTOMY   . LEFT CARPAL TUNNEL / LEFT MIDDLE & RING FINGER TRIGGER RELEASE  08-26-2008  . LEFT SHOULDER ARTHROSCOPY W/ DEBRIDEMENT  09-09-2003  . LEFT SHOULDER ARTHROSCOPY/ LEFT THUMB TRIGGER RELEASE  02-22-2005  . PHOTOCOAGULATION WITH LASER Right 03/20/2018   Procedure: PHOTOCOAGULATION WITH LASER;  Surgeon: Carmela Rima, MD;  [facility];  Service: Ophthalmology;  Laterality: Right;  . PULLEY RELEASE LEFT LONG FINGER  07-14-2009  . RADIAL HEAD ARTHROPLASTY Right 06/17/2018   Procedure: RADIAL HEAD ARTHROPLASTY;  Surgeon: Bjorn Pippin, MD;  [facility];  Service: Orthopedics;  Laterality: Right;  . RIGHT CARPAL TUNNEL/ RIGHT THUMB TRIGGER RELEASE'S  11-28-2006  . RIGHT SHOULDER ARTHROSCOPY W/ ROTATOR CUFF REPAIR  01-13-2004  . SHOULDER ARTHROSCOPY DISTAL CLAVICLE EXCISION AND OPEN ROTATOR CUFF REPAIR  09-07-2004   LEFT  . SHOULDER ARTHROSCOPY W/ ACROMIAL REPAIR  11-29-2005   LEFT  . TONSILLECTOMY AND ADENOIDECTOMY  child  . TOTAL KNEE ARTHROPLASTY  12-14-2009   RIGHT  . TOTAL KNEE ARTHROPLASTY Left 11/05/2012   Procedure: LEFT TOTAL KNEE ARTHROPLASTY;  Surgeon: Loanne Drilling, MD;  Location: [facility];  Service: Orthopedics;  Laterality: Left;  . TOTAL KNEE REVISION Right 07/29/2016   Procedure: RIGHT KNEE POLY-LINER EXCHANGE;  Surgeon: Kathryne Hitch, MD;  Location: [facility];  Service: Orthopedics;  Laterality: Right;  . TRIGGER FINGER RELEASE Right 02/21/2013   Procedure: RIGHT RING A-1 PULLEY RELEASE    (MINOR PROCEDURE) ;  Surgeon: Wyn Forster., MD;  Location: [facility];  Service: Orthopedics;  Laterality: Right;  . TRIGGER FINGER RELEASE Left 09/22/2016   Procedure: RELEASE TRIGGER FINGER LEFT INDEX FINGER;  Surgeon: Kathryne Hitch, MD;  [facility];  Service: Orthopedics;  Laterality: Left;  . TRIGGER FINGER RELEASE Right 01/20/2017   Procedure: RELEASE TRIGGER FINGER/A-1 PULLEY RIGHT INDEX FINGER;  Surgeon: Marcene Corning, MD;  Location: [facility];  Service: Orthopedics;  Laterality: Right;  . VITRECTOMY 25 GAUGE WITH SCLERAL BUCKLE Right 03/20/2018   Procedure: RIGHT EYE VITRECTOMY WITH  ENDOLASER PARENTAL PHOTOCOAGULATION 25 GAUGE;  Surgeon: Carmela Rima, MD;  Location: [facility] OR;  Service: Ophthalmology;  Laterality: Right;    There were no vitals filed for this visit.   Subjective Assessment - 08/08/18 0905    Subjective  Pt is a Rt handd dominant female who presents to PT s/p Rt elbow surgery (coronoid ORIF/radial head arthroplasty/lateral ligament repair) on 06/16/18.  Pt lost her footing and fell backwards.  Pt had PT at another facility ending last week.      Pertinent History  elbow surgery: coronoid ORIF/radial head arhtroplasty/lateral ligament repair on 06/16/18    Patient Stated Goals  return to regular use of the Rt UE, improve strength    Currently in Pain?  Yes    Pain Score  4     Pain Location  Elbow    Pain Orientation  Right    Pain Descriptors / Indicators  Tightness;Aching    Pain Type  Surgical pain    Pain Onset  More than a month ago    Pain Frequency  Constant    Aggravating Factors   with use of Rt UE, wrist hurts constantly    Pain Relieving Factors  rest, ice, pain medication         [facility] PT Assessment - 08/08/18 0001      Assessment   Medical Diagnosis  Rt terrible triad: coronoid ORIF, radial head arthroplasty/lateral ligament repair    Referring Provider (PT)  Ramond Marrow, MD    Onset Date/Surgical Date  06/16/18    Hand Dominance  Right    Next MD Visit  09/13/18    Prior Therapy  after surgery after surgery      Precautions   Precautions  Other (comment)    Precaution Comments  follow protocol, no lifting >10 lbs until 09/15/18      Restrictions   Weight Bearing Restrictions  No      Balance Screen   Has the patient fallen in the past 6 months  Yes    How many times?  1   lost footing in garage- no balance deficits   Has the patient had a decrease in activity level  because of a fear of falling?   No    Is the patient reluctant to leave their home because of a fear of falling?   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 [employer]   Overall Cognitive Status  Within Functional Limits for tasks assessed      Observation/Other Assessments   Focus on Therapeutic Outcomes (FOTO)   64% limitation      Posture/Postural Control   Posture/Postural Control  Postural limitations    Postural Limitations  Rounded Shoulders;Forward head      ROM / Strength   AROM / PROM / Strength  AROM;PROM;Strength      AROM   Overall AROM   Deficits    Overall AROM Comments  Lt shoulder and elbow full A/ROM, Rt shoulder full with pain at end range    AROM Assessment Site  Elbow    Right/Left Elbow  Right    Right Elbow Flexion  125    Right Elbow Extension  20      PROM   Overall PROM   Deficits    Overall PROM Comments  20-125      Strength   Overall Strength  Due to precautions    Overall Strength Comments  Rt elbow not tested due to precautions    Strength Assessment Site  Wrist;Shoulder;Hand    Right/Left Shoulder  Right;Left    Right Shoulder Flexion  4/5    Right Shoulder ABduction  4/5    Left Shoulder Flexion  5/5    Left Shoulder Extension  5/5    Left Shoulder ABduction  5/5    Left Shoulder Internal Rotation  5/5    Left Shoulder External Rotation  5/5    Right/Left Wrist  Right;Left    Right Wrist Flexion  4/5    Right Wrist Extension  4/5    Right Wrist Radial Deviation  4/5    Right/Left hand  Right;Left    Right Hand Grip (lbs)  22    Left Hand Grip (lbs)  65      Palpation   Palpation comment  well healing surgical incisions over Rt  dorsal and ventral elbow      Ambulation/Gait   Ambulation/Gait  Yes    Gait Pattern  Within Functional Limits                Objective measurements completed on examination: See above findings.              PT Education - 08/08/18 0940    Education Details   Access Code: [email or web address]     Person(s) Educated  Patient    Methods  Explanation;Demonstration;Handout    Comprehension  Verbalized understanding;Returned demonstration       PT Short Term Goals - 08/08/18 0909      PT SHORT TERM GOAL #1   Title  be independent in initial HEP    Time  4    Period  Weeks    Status  New    Target Date  09/05/18      PT SHORT TERM GOAL #2   Title  demonstrate Rt grip to > or = to 40# to hold objects safely    Time  4    Period  Weeks    Status  New    Target Date  09/05/18      PT SHORT TERM GOAL #3  Title  report < or = to 2/10 Rt UE pain with use at home    Baseline  --    Time  4    Period  Weeks    Status  New    Target Date  09/05/18      PT SHORT TERM GOAL #4   Title  demonstrae 4+/5 Rt shoulder strength to improve use overhead and with daily tasks    Baseline  --    Time  4    Period  Weeks    Status  New    Target Date  09/05/18        PT Long Term Goals - 08/08/18 0956      PT LONG TERM GOAL #1   Status  New    Target Date  10/03/18      PT LONG TERM GOAL #2   Title  reduce FOTO to < or = 43% limitation    Baseline  --    Time  8    Period  Weeks    Status  New    Target Date  10/03/18      PT LONG TERM GOAL #3   Title  demonstrate > or = to 55# to hold objects safely    Baseline  --    Time  8    Period  Weeks    Status  New    Target Date  10/03/18      PT LONG TERM GOAL #4   Title  demonstrate > or = to 4/5 Rt elbow strength to improve endurance with use    Time  8    Period  Weeks    Status  New    Target Date  10/03/18      PT LONG TERM GOAL #5   Title  report > or = to 70% use of Rt UE for home, work and self-care  tasks    Baseline  --    Time  8    Period  Weeks    Status  New    Target Date  09/26/18             Plan - 08/08/18 0951    Clinical Impression Statement  Pt presents to PT s/p Rt elbow surgery for "terrible triad" injury including coronoid ORIF/radial head arthroplasty/lateral ligament repair performed on 06/16/18.  Pt had a fall that resulted in injury.  Pt had PT at another facility that ended last week.  Pt has a lower copay at our facility.  Pt is limited by protocol at this time and is limited to lifting > 10#.  Pt demonstrates reduced Rt elbow extension.  Pt with full flexion and supination/pronation on the Rt.  Rt shoulder and wrist and grip strength are limited.  Pt with well healing surgical incisions.  Pt will benefit from skilled PT for safe progression of strength and endurance per post-op protocol.       Personal Factors and Comorbidities  Age;Comorbidity 1    Comorbidities  obesity    Examination-Activity Limitations  Bathing;Hygiene/Grooming;Carry    Examination-Participation Restrictions  Cleaning;Shop;Meal Prep    Stability/Clinical Decision Making  Stable/Uncomplicated    Clinical Decision Making  Low    Rehab Potential  Excellent    PT Frequency  2x / week    PT Duration  8 weeks    PT Treatment/Interventions  ADLs/Self Care Home Management;Cryotherapy;Electrical Stimulation;Moist Heat;Functional mobility training;Therapeutic activities;Therapeutic exercise;Patient/family education;Manual techniques;Vasopneumatic Device;Taping;Passive range of motion;Scar  mobilization    PT Next Visit Plan  follow protocol-pt to have PT send, pulleys, arm bike, grip, ROM    PT Home Exercise Plan  Access Code: [email or web address]     Consulted and Agree with Plan of Care  Patient       Patient will benefit from skilled therapeutic intervention in order to improve the following deficits and impairments:  Abnormal gait, Impaired flexibility, Impaired UE functional use, Decreased endurance,  Decreased activity tolerance, Decreased range of motion, Decreased strength, Pain  Visit Diagnosis: Stiffness of right elbow, not elsewhere classified - Plan: PT plan of care cert/re-cert  Pain in right elbow - Plan: PT plan of care cert/re-cert  Muscle weakness (generalized) - Plan: PT plan of care cert/re-cert  Localized edema - Plan: PT plan of care cert/re-cert     Problem List Patient Active Problem List   Diagnosis Date Noted  . Radial head fracture, closed 06/16/2018  . Closed fracture dislocation of elbow 06/16/2018  . Other fatigue 07/18/2017  . Shortness of breath on exertion 07/18/2017  . Type 2 diabetes mellitus with retinopathy, with long-term current use of insulin (HCC) 07/18/2017  . Vitamin D deficiency 07/18/2017  . B12 nutritional deficiency 07/18/2017  . Other specified hypothyroidism 07/18/2017  . Acute pain of right knee 12/22/2016  . Trigger index finger of left hand 09/22/2016  . 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 Diabetes (HCC) 01/08/2014  . Essential hypertension 01/08/2014  . Hyperlipidemia 01/08/2014  . Postoperative anemia due to acute blood loss 11/22/2012  . Hyponatremia 11/06/2012  . OA (osteoarthritis) of knee 11/05/2012  . Villonodular synovitis of knee 04/20/2011    Lorrene ReidKelly Elba Schaber, PT 08/08/18 10:00 AM  [facility] [address], [address] [place], [place], [address] Phone: [phone]   Fax:  [phone]  Name: Alinda DeemBonnie D Snee MRN: [id number] Date of Birth: 05-27-1950

## 2018-08-08 NOTE — Patient Instructions (Signed)
Access Code: [email or web address]  URL: [email or web address]  Date: 08/08/2018  Prepared by: Lorrene Reid   Exercises  Seated Gripping Towel - 10 reps - 3 sets - 3x daily - 7x weekly  Shoulder Flexion Wall Slide with Towel - 10 reps - 1 sets - 5 hold - 3x daily - 7x weekly  Seated Shoulder Abduction - Palms Down - 10 reps - 2 sets - 2x daily - 7x weekly  Seated Shoulder Flexion - 10 reps - 2 sets - 2x daily - 7x weekly  Seated Shoulder Scaption - 10 reps - 2 sets - 2x daily - 7x weekly

## 2018-08-09 ENCOUNTER — Other Ambulatory Visit: Payer: Self-pay | Admitting: Cardiovascular Disease

## 2018-08-09 MED FILL — metFORMIN HCL ER 500 MG TB2: 500 | 90 days supply | Qty: 180 | Fill #3

## 2018-08-09 MED FILL — FLECAINIDE ACETATE 50 MG TA: 50 | 90 days supply | Qty: 180 | Fill #0

## 2018-08-09 MED FILL — METHOCARBAMOL 500 MG TABLET: 500 | 15 days supply | Qty: 60 | Fill #1

## 2018-08-09 MED FILL — TRULICITY 1.5 MG/0.5 ML PEN: 1.5 | 28 days supply | Qty: 2 | Fill #2

## 2018-08-10 ENCOUNTER — Ambulatory Visit: Payer: 59 | Admitting: Physical Therapy

## 2018-08-10 ENCOUNTER — Other Ambulatory Visit (INDEPENDENT_AMBULATORY_CARE_PROVIDER_SITE_OTHER): Payer: Self-pay | Admitting: Orthopaedic Surgery

## 2018-08-10 ENCOUNTER — Other Ambulatory Visit: Payer: Self-pay

## 2018-08-10 ENCOUNTER — Encounter: Payer: Self-pay | Admitting: Physical Therapy

## 2018-08-10 DIAGNOSIS — M25621 Stiffness of right elbow, not elsewhere classified: Secondary | ICD-10-CM

## 2018-08-10 DIAGNOSIS — R600 Localized edema: Secondary | ICD-10-CM

## 2018-08-10 DIAGNOSIS — M25521 Pain in right elbow: Secondary | ICD-10-CM | POA: Diagnosis not present

## 2018-08-10 DIAGNOSIS — M6281 Muscle weakness (generalized): Secondary | ICD-10-CM

## 2018-08-10 NOTE — Therapy (Signed)
[facility] [address] [place], [place], [address] Phone: [phone]   Fax:  [phone]  Physical Therapy Treatment  Patient Details  Name: [name] MRN: [id number] Date of Birth: Feb 04, 1951 Referring Provider (PT): Ramond Marrow, MD   Encounter Date: 08/10/2018  PT End of Session - 08/10/18 0925    Visit Number  2    Date for PT Re-Evaluation  10/03/18    Authorization Type  UMR    PT Start Time  0925    PT Stop Time  1015   10 min ice   PT Time Calculation (min)  50 min    Activity Tolerance  Patient tolerated treatment well    Behavior During Therapy  [facility] for tasks assessed/performed       Past Medical History:  Diagnosis Date  . A-fib (HCC)   . Anemia    hx of  . Arthritis   . Chest pain   . Constipation   . Depression   . Diabetes mellitus ORAL MEDS  . Diabetic retinopathy (HCC)   . Dyspnea    with minimal exertion-deconditioned  . Dyspnea   . Dysrhythmia    a-fib  . Food allergy   . GERD (gastroesophageal reflux disease)    occasional  . Gout   . History of kidney stones   . Hypercholesteremia   . Hyperlipidemia   . Hypertension   . Hypertensive kidney disease   . Hypothyroidism   . Knee pain, right   . 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 Obesity   . OSA on CPAP    uses CPAP nightly  . Osteoarthritis   . Palpitations   . Pinched nerve in neck   . Pleurisy   . Pneumonia    hx of  . PONV (postoperative nausea and vomiting)    for 3-4 days after general anesthesia  . Sciatica   . Swelling of knee joint, right   . Synovitis of knee RIGHT  . Trigger finger, left    left index  . Vitamin D deficiency     Past Surgical History:  Procedure Laterality Date  . CESAREAN SECTION  X3  . KNEE ARTHROSCOPY  04/20/2011   Procedure: ARTHROSCOPY KNEE;  Surgeon: Loanne Drilling, MD;  Location: [facility];  Service: Orthopedics;  Laterality: Right;  WITH  SYNOVECTOMY  . LEFT CARPAL TUNNEL / LEFT MIDDLE & RING FINGER TRIGGER RELEASE  08-26-2008  . LEFT SHOULDER ARTHROSCOPY W/ DEBRIDEMENT  09-09-2003  . LEFT SHOULDER ARTHROSCOPY/ LEFT THUMB TRIGGER RELEASE  02-22-2005  . PHOTOCOAGULATION WITH LASER Right 03/20/2018   Procedure: PHOTOCOAGULATION WITH LASER;  Surgeon: Carmela Rima, MD;  Location: [facility] OR;  Service: Ophthalmology;  Laterality: Right;  . PULLEY RELEASE LEFT LONG FINGER  07-14-2009  . RADIAL HEAD ARTHROPLASTY Right 06/17/2018   Procedure: RADIAL HEAD ARTHROPLASTY;  Surgeon: Bjorn Pippin, MD;  Location[facility];  Service: Orthopedics;  Laterality: Right;  . RIGHT CARPAL TUNNEL/ RIGHT THUMB TRIGGER RELEASE'S  11-28-2006  . RIGHT SHOULDER ARTHROSCOPY W/ ROTATOR CUFF REPAIR  01-13-2004  . SHOULDER ARTHROSCOPY DISTAL CLAVICLE EXCISION AND OPEN ROTATOR CUFF REPAIR  09-07-2004   LEFT  . SHOULDER ARTHROSCOPY W/ ACROMIAL REPAIR  11-29-2005   LEFT  . TONSILLECTOMY AND ADENOIDECTOMY  child  . TOTAL KNEE ARTHROPLASTY  12-14-2009   RIGHT  . TOTAL KNEE ARTHROPLASTY Left 11/05/2012   Procedure: LEFT TOTAL KNEE ARTHROPLASTY;  Surgeon: Loanne Drilling,  MD;  Location[facility];  Service: Orthopedics;  Laterality: Left;  . TOTAL KNEE REVISION Right 07/29/2016   Procedure: RIGHT KNEE POLY-LINER EXCHANGE;  Surgeon: Kathryne Hitch, MD;  Location: [facility];  Service: Orthopedics;  Laterality: Right;  . TRIGGER FINGER RELEASE Right 02/21/2013   Procedure: RIGHT RING A-1 PULLEY RELEASE    (MINOR PROCEDURE) ;  Surgeon: Wyn Forster., MD;  Location: [facility];  Service: Orthopedics;  Laterality: Right;  . TRIGGER FINGER RELEASE Left 09/22/2016   Procedure: RELEASE TRIGGER FINGER LEFT INDEX FINGER;  Surgeon: Kathryne Hitch, MD;  [facility];  Service: Orthopedics;  Laterality: Left;  . TRIGGER FINGER RELEASE Right 01/20/2017   Procedure: RELEASE TRIGGER FINGER/A-1 PULLEY RIGHT INDEX FINGER;  Surgeon: Marcene Corning, MD;   Location: [facility];  Service: Orthopedics;  Laterality: Right;  . VITRECTOMY 25 GAUGE WITH SCLERAL BUCKLE Right 03/20/2018   Procedure: RIGHT EYE VITRECTOMY WITH  ENDOLASER PARENTAL PHOTOCOAGULATION 25 GAUGE;  Surgeon: Carmela Rima, MD;  Location: [facility];  Service: Ophthalmology;  Laterality: Right;    There were no vitals filed for this visit.  Subjective Assessment - 08/10/18 1010    Subjective  Pt reports feeling more pain today.  She states she was feeling more wrist pain lately.    Patient Stated Goals  return to regular use of the Rt UE, improve strength    Currently in Pain?  Yes    Pain Score  6     Pain Location  Elbow    Pain Orientation  Right    Pain Descriptors / Indicators  Aching;Tightness    Pain Type  Surgical pain    Pain Radiating Towards  wrist    Pain Onset  More than a month ago    Pain Frequency  Constant    Aggravating Factors   using Rt UE    Pain Relieving Factors  rest, ice    Multiple Pain Sites  No                       [facility] Adult PT Treatment/Exercise - 08/10/18 0001      Exercises   Exercises  Shoulder;Wrist      Shoulder Exercises: Seated   Flexion  Strengthening;Right;Left;20 reps;Weights   1lb   Abduction  Strengthening;Both;20 reps;Weights   1lb - palms down   Other Seated Exercises  scaption - 1lb - 10x      Shoulder Exercises: Pulleys   Flexion  3 minutes      Shoulder Exercises: ROM/Strengthening   UBE (Upper Arm Bike)  L1 - 2x2 fwd/back      Wrist Exercises   Wrist Extension  Strengthening;Right;20 reps;Seated    Other wrist exercises  grip with 3lb - 30x    Other wrist exercises  supination/pronation elbow - 20x      Modalities   Modalities  Cryotherapy      Cryotherapy   Number Minutes Cryotherapy  10 Minutes    Cryotherapy Location  --   elbow   Type of Cryotherapy  Ice pack               PT Short Term Goals - 08/10/18 1017      PT SHORT TERM GOAL #1   Title  be independent  in initial HEP    Status  Achieved      PT SHORT TERM GOAL #2   Title  demonstrate Rt grip to >  or = to 40# to hold objects safely    Status  On-going        PT Long Term Goals - 08/08/18 0956      PT LONG TERM GOAL #1   Status  New    Target Date  10/03/18      PT LONG TERM GOAL #2   Title  reduce FOTO to < or = 43% limitation    Baseline  --    Time  8    Period  Weeks    Status  New    Target Date  10/03/18      PT LONG TERM GOAL #3   Title  demonstrate > or = to 55# to hold objects safely    Baseline  --    Time  8    Period  Weeks    Status  New    Target Date  10/03/18      PT LONG TERM GOAL #4   Title  demonstrate > or = to 4/5 Rt elbow strength to improve endurance with use    Time  8    Period  Weeks    Status  New    Target Date  10/03/18      PT LONG TERM GOAL #5   Title  report > or = to 70% use of Rt UE for home, work and self-care tasks    Baseline  --    Time  8    Period  Weeks    Status  New    Target Date  09/26/18            Plan - 08/10/18 0925    Clinical Impression Statement  Pt did well with exercises today. She had tolerable increased of pain.  She reports less elbow pain after exercises and was able to add small amount of weight . Pt will continue to benefit from skilled PT to progress ROm and strength safely with good techniques.    PT Treatment/Interventions  ADLs/Self Care Home Management;Cryotherapy;Electrical Stimulation;Moist Heat;Functional mobility training;Therapeutic activities;Therapeutic exercise;Patient/family education;Manual techniques;Vasopneumatic Device;Taping;Passive range of motion;Scar mobilization    PT Next Visit Plan  follow protocol-pt to have PT send, pulleys, arm bike, grip, ROM    PT Home Exercise Plan  Access Code: [email or web address]     Consulted and Agree with Plan of Care  Patient       Patient will benefit from skilled therapeutic intervention in order to improve the following deficits and impairments:   Abnormal gait, Impaired flexibility, Impaired UE functional use, Decreased endurance, Decreased activity tolerance, Decreased range of motion, Decreased strength, Pain  Visit Diagnosis: Stiffness of right elbow, not elsewhere classified  Pain in right elbow  Muscle weakness (generalized)  Localized edema     Problem List Patient Active Problem List   Diagnosis Date Noted  . Radial head fracture, closed 06/16/2018  . Closed fracture dislocation of elbow 06/16/2018  . Other fatigue 07/18/2017  . Shortness of breath on exertion 07/18/2017  . Type 2 diabetes mellitus with retinopathy, with long-term current use of insulin (HCC) 07/18/2017  . Vitamin D deficiency 07/18/2017  . B12 nutritional deficiency 07/18/2017  . Other specified hypothyroidism 07/18/2017  . Acute pain of right knee 12/22/2016  . Trigger index finger of left hand 09/22/2016  . 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 Diabetes (HCC) 01/08/2014  .  Essential hypertension 01/08/2014  . Hyperlipidemia 01/08/2014  . Postoperative anemia due to acute blood loss 11/22/2012  . Hyponatremia 11/06/2012  . OA (osteoarthritis) of knee 11/05/2012  . Villonodular synovitis of knee 04/20/2011    Junious Silk, PT 08/10/2018, 10:18 AM  [facility] [address], [address] [place], [place], [address] Phone: [phone]   Fax:  [phone]  Name: ZANIYA MCAULAY MRN: [id number] Date of Birth: Apr 23, 1950

## 2018-08-10 NOTE — Telephone Encounter (Signed)
Refill request

## 2018-08-11 MED FILL — HYDROCODON-APAP 5-325: 5-325 | 7 days supply | Qty: 30 | Fill #0

## 2018-08-14 ENCOUNTER — Ambulatory Visit: Payer: 59 | Attending: Orthopaedic Surgery

## 2018-08-14 ENCOUNTER — Other Ambulatory Visit: Payer: Self-pay

## 2018-08-14 DIAGNOSIS — R600 Localized edema: Secondary | ICD-10-CM | POA: Diagnosis present

## 2018-08-14 DIAGNOSIS — M25521 Pain in right elbow: Secondary | ICD-10-CM | POA: Diagnosis present

## 2018-08-14 DIAGNOSIS — M25621 Stiffness of right elbow, not elsewhere classified: Secondary | ICD-10-CM

## 2018-08-14 DIAGNOSIS — M6281 Muscle weakness (generalized): Secondary | ICD-10-CM | POA: Diagnosis present

## 2018-08-14 DIAGNOSIS — G4733 Obstructive sleep apnea (adult) (pediatric): Secondary | ICD-10-CM | POA: Diagnosis not present

## 2018-08-14 NOTE — Therapy (Signed)
[facility] [address], [address] [place], [place], [address] Phone: [phone]   Fax:  [phone]  Physical Therapy Treatment  Patient Details  Name: [name] MRN: [id number] Date of Birth: 03/15/1950 Referring Provider (PT): Ramond Marrow, MD   Encounter Date: 08/14/2018  PT End of Session - 08/14/18 1121    Visit Number  3    Date for PT Re-Evaluation  10/03/18    Authorization Type  UMR    PT Start Time  1038    PT Stop Time  1133    PT Time Calculation (min)  55 min    Activity Tolerance  Patient tolerated treatment well    Behavior During Therapy  [facility] for tasks assessed/performed       Past Medical History:  Diagnosis Date  . A-fib (HCC)   . Anemia    hx of  . Arthritis   . Chest pain   . Constipation   . Depression   . Diabetes mellitus ORAL MEDS  . Diabetic retinopathy (HCC)   . Dyspnea    with minimal exertion-deconditioned  . Dyspnea   . Dysrhythmia    a-fib  . Food allergy   . GERD (gastroesophageal reflux disease)    occasional  . Gout   . History of kidney stones   . Hypercholesteremia   . Hyperlipidemia   . Hypertension   . Hypertensive kidney disease   . Hypothyroidism   . Knee pain, right   . 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 Obesity   . OSA on CPAP    uses CPAP nightly  . Osteoarthritis   . Palpitations   . Pinched nerve in neck   . Pleurisy   . Pneumonia    hx of  . PONV (postoperative nausea and vomiting)    for 3-4 days after general anesthesia  . Sciatica   . Swelling of knee joint, right   . Synovitis of knee RIGHT  . Trigger finger, left    left index  . Vitamin D deficiency     Past Surgical History:  Procedure Laterality Date  . CESAREAN SECTION  X3  . KNEE ARTHROSCOPY  04/20/2011   Procedure: ARTHROSCOPY KNEE;  Surgeon: Loanne Drilling, MD;  Location: [facility];  Service: Orthopedics;  Laterality: Right;  WITH SYNOVECTOMY  .  LEFT CARPAL TUNNEL / LEFT MIDDLE & RING FINGER TRIGGER RELEASE  08-26-2008  . LEFT SHOULDER ARTHROSCOPY W/ DEBRIDEMENT  09-09-2003  . LEFT SHOULDER ARTHROSCOPY/ LEFT THUMB TRIGGER RELEASE  02-22-2005  . PHOTOCOAGULATION WITH LASER Right 03/20/2018   Procedure: PHOTOCOAGULATION WITH LASER;  Surgeon: Carmela Rima, MD;  Location: [facility] OR;  Service: Ophthalmology;  Laterality: Right;  . PULLEY RELEASE LEFT LONG FINGER  07-14-2009  . RADIAL HEAD ARTHROPLASTY Right 06/17/2018   Procedure: RADIAL HEAD ARTHROPLASTY;  Surgeon: Bjorn Pippin, MD;  [facility];  Service: Orthopedics;  Laterality: Right;  . RIGHT CARPAL TUNNEL/ RIGHT THUMB TRIGGER RELEASE'S  11-28-2006  . RIGHT SHOULDER ARTHROSCOPY W/ ROTATOR CUFF REPAIR  01-13-2004  . SHOULDER ARTHROSCOPY DISTAL CLAVICLE EXCISION AND OPEN ROTATOR CUFF REPAIR  09-07-2004   LEFT  . SHOULDER ARTHROSCOPY W/ ACROMIAL REPAIR  11-29-2005   LEFT  . TONSILLECTOMY AND ADENOIDECTOMY  child  . TOTAL KNEE ARTHROPLASTY  12-14-2009   RIGHT  . TOTAL KNEE ARTHROPLASTY Left 11/05/2012   Procedure: LEFT TOTAL KNEE ARTHROPLASTY;  Surgeon: Loanne Drilling, MD;  Location: [facility];  Service: Orthopedics;  Laterality: Left;  . TOTAL KNEE REVISION Right 07/29/2016   Procedure: RIGHT KNEE POLY-LINER EXCHANGE;  Surgeon: Kathryne Hitch, MD;  Location: [facility];  Service: Orthopedics;  Laterality: Right;  . TRIGGER FINGER RELEASE Right 02/21/2013   Procedure: RIGHT RING A-1 PULLEY RELEASE    (MINOR PROCEDURE) ;  Surgeon: Wyn Forster., MD;  Location: [facility];  Service: Orthopedics;  Laterality: Right;  . TRIGGER FINGER RELEASE Left 09/22/2016   Procedure: RELEASE TRIGGER FINGER LEFT INDEX FINGER;  Surgeon: Kathryne Hitch, MD;  [facility]: [facility];  Service: Orthopedics;  Laterality: Left;  . TRIGGER FINGER RELEASE Right 01/20/2017   Procedure: RELEASE TRIGGER FINGER/A-1 PULLEY RIGHT INDEX FINGER;  Surgeon: Marcene Corning, MD;  Location: [facility];  Service: Orthopedics;  Laterality: Right;  . VITRECTOMY 25 GAUGE WITH SCLERAL BUCKLE Right 03/20/2018   Procedure: RIGHT EYE VITRECTOMY WITH  ENDOLASER PARENTAL PHOTOCOAGULATION 25 GAUGE;  Surgeon: Carmela Rima, MD;  Location: [facility] OR;  Service: Ophthalmology;  Laterality: Right;    There were no vitals filed for this visit.  Subjective Assessment - 08/14/18 1043    Subjective  I hear clicking when I move my Rt arm with use.  I am worried that I will dislocate my elbow because the MD says it is a possibility.    Pertinent History  elbow surgery: coronoid ORIF/radial head arhtroplasty/lateral ligament repair on 06/16/18  avoid elbow varus, avoid shoulder abduction in upright position    Patient Stated Goals  return to regular use of the Rt UE, improve strength    Currently in Pain?  Yes    Pain Score  5     Pain Location  Elbow    Pain Orientation  Right    Pain Descriptors / Indicators  Aching;Tightness    Pain Type  Surgical pain    Pain Onset  More than a month ago    Pain Frequency  Constant    Aggravating Factors   use of Rt UE    Pain Relieving Factors  rest, ice, keeping arm close to side                       [facility] Adult PT Treatment/Exercise - 08/14/18 0001      Shoulder Exercises: Standing   Other Standing Exercises  cone stack to 1st shelf with pronation x 3 minutes       Shoulder Exercises: ROM/Strengthening   UBE (Upper Arm Bike)  L1 - 2x2 fwd/back      Wrist Exercises   Wrist Extension  Strengthening;Right;20 reps;Seated    Bar Weights/Barbell (Wrist Extension)  1 lb    Wrist Radial Deviation  Strengthening;Right;20 reps    Bar Weights/Barbell (Radial Deviation)  1 lb    Other wrist exercises  grip with 3lb -3 minutes    Other wrist exercises  wrist extension/flexion using velcro board (thick strip)x 6 reps      Cryotherapy   Number Minutes Cryotherapy  10 Minutes    Cryotherapy Location  --   elbow   Type of Cryotherapy  Ice pack       Manual Therapy   Manual Therapy  Soft tissue mobilization;Myofascial release    Manual therapy comments  Rt forearm and scar mobs               PT Short Term Goals - 08/10/18 1017      PT SHORT TERM  GOAL #1   Title  be independent in initial HEP    Status  Achieved      PT SHORT TERM GOAL #2   Title  demonstrate Rt grip to > or = to 40# to hold objects safely    Status  On-going        PT Long Term Goals - 08/08/18 0956      PT LONG TERM GOAL #1   Status  New    Target Date  10/03/18      PT LONG TERM GOAL #2   Title  reduce FOTO to < or = 43% limitation    Baseline  --    Time  8    Period  Weeks    Status  New    Target Date  10/03/18      PT LONG TERM GOAL #3   Title  demonstrate > or = to 55# to hold objects safely    Baseline  --    Time  8    Period  Weeks    Status  New    Target Date  10/03/18      PT LONG TERM GOAL #4   Title  demonstrate > or = to 4/5 Rt elbow strength to improve endurance with use    Time  8    Period  Weeks    Status  New    Target Date  10/03/18      PT LONG TERM GOAL #5   Title  report > or = to 70% use of Rt UE for home, work and self-care tasks    Baseline  --    Time  8    Period  Weeks    Status  New    Target Date  09/26/18            Plan - 08/14/18 1059    Clinical Impression Statement  Pt continues to follow post-op protocol.  PT eliminated shoulder scaption and abduction as protocol indicates to avoid abduction when upright.  PT focused on grip, wrist strength and controlled movement of Rt arm into flexion in protected position (pronation of forearm).  Pt requires verbal cues for stabilization of forearm with wrist exercises.  Pt will continue to benefit from skilled PT for Rt UE use, strength and flexibility s/p surgery safely following protocol.      Rehab Potential  Excellent    PT Frequency  2x / week    PT Duration  8 weeks    PT Treatment/Interventions  ADLs/Self Care Home  Management;Cryotherapy;Electrical Stimulation;Moist Heat;Functional mobility training;Therapeutic activities;Therapeutic exercise;Patient/family education;Manual techniques;Vasopneumatic Device;Taping;Passive range of motion;Scar mobilization    PT Next Visit Plan  follow protocol-pulleys, arm bike, grip, ROM, wrist strength    PT Home Exercise Plan  Access Code: [email or web address]     Recommended Other Services  initial assessment is signed    Consulted and Agree with Plan of Care  Patient       Patient will benefit from skilled therapeutic intervention in order to improve the following deficits and impairments:  Abnormal gait, Impaired flexibility, Impaired UE functional use, Decreased endurance, Decreased activity tolerance, Decreased range of motion, Decreased strength, Pain  Visit Diagnosis: Stiffness of right elbow, not elsewhere classified  Pain in right elbow  Muscle weakness (generalized)  Localized edema     Problem List Patient Active Problem List   Diagnosis Date Noted  . Radial head fracture, closed 06/16/2018  . Closed fracture dislocation of elbow  06/16/2018  . Other fatigue 07/18/2017  . Shortness of breath on exertion 07/18/2017  . Type 2 diabetes mellitus with retinopathy, with long-term current use of insulin (HCC) 07/18/2017  . Vitamin D deficiency 07/18/2017  . B12 nutritional deficiency 07/18/2017  . Other specified hypothyroidism 07/18/2017  . Acute pain of right knee 12/22/2016  . Trigger index finger of left hand 09/22/2016  . 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 Diabetes (HCC) 01/08/2014  . Essential hypertension 01/08/2014  . Hyperlipidemia 01/08/2014  . Postoperative anemia due to acute blood loss 11/22/2012  . Hyponatremia 11/06/2012  . OA (osteoarthritis) of knee 11/05/2012  . Villonodular synovitis  of knee 04/20/2011   Lorrene Reid, PT 08/14/18 11:30 AM  [facility] [address], [address] [place], [place], [address] Phone: [phone]   Fax:  [phone]  Name: [name] MRN: [id number] Date of Birth: [birth year]

## 2018-08-16 ENCOUNTER — Ambulatory Visit: Payer: 59

## 2018-08-16 ENCOUNTER — Other Ambulatory Visit: Payer: Self-pay

## 2018-08-16 DIAGNOSIS — M25521 Pain in right elbow: Secondary | ICD-10-CM

## 2018-08-16 DIAGNOSIS — M6281 Muscle weakness (generalized): Secondary | ICD-10-CM

## 2018-08-16 DIAGNOSIS — M25621 Stiffness of right elbow, not elsewhere classified: Secondary | ICD-10-CM

## 2018-08-16 DIAGNOSIS — R600 Localized edema: Secondary | ICD-10-CM

## 2018-08-16 NOTE — Therapy (Signed)
[facility] [address], [address] [place], [place], [address] Phone: [phone]   Fax:  [phone]  Physical Therapy Treatment  Patient Details  Name: [name] MRN: [id number] Date of Birth: 08-31-1950 Referring Provider (PT): Ramond Marrow, MD   Encounter Date: 08/16/2018  PT End of Session - 08/16/18 1119    Visit Number  4    Date for PT Re-Evaluation  10/03/18    Authorization Type  UMR    PT Start Time  1029    PT Stop Time  1121    PT Time Calculation (min)  52 min    Activity Tolerance  Patient tolerated treatment well    Behavior During Therapy  [facility] for tasks assessed/performed       Past Medical History:  Diagnosis Date  . A-fib (HCC)   . Anemia    hx of  . Arthritis   . Chest pain   . Constipation   . Depression   . Diabetes mellitus ORAL MEDS  . Diabetic retinopathy (HCC)   . Dyspnea    with minimal exertion-deconditioned  . Dyspnea   . Dysrhythmia    a-fib  . Food allergy   . GERD (gastroesophageal reflux disease)    occasional  . Gout   . History of kidney stones   . Hypercholesteremia   . Hyperlipidemia   . Hypertension   . Hypertensive kidney disease   . Hypothyroidism   . Knee pain, right   . 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 Obesity   . OSA on CPAP    uses CPAP nightly  . Osteoarthritis   . Palpitations   . Pinched nerve in neck   . Pleurisy   . Pneumonia    hx of  . PONV (postoperative nausea and vomiting)    for 3-4 days after general anesthesia  . Sciatica   . Swelling of knee joint, right   . Synovitis of knee RIGHT  . Trigger finger, left    left index  . Vitamin D deficiency     Past Surgical History:  Procedure Laterality Date  . CESAREAN SECTION  X3  . KNEE ARTHROSCOPY  04/20/2011   Procedure: ARTHROSCOPY KNEE;  Surgeon: Loanne Drilling, MD;  Location: [facility];  Service: Orthopedics;  Laterality: Right;  WITH SYNOVECTOMY  .  LEFT CARPAL TUNNEL / LEFT MIDDLE & RING FINGER TRIGGER RELEASE  08-26-2008  . LEFT SHOULDER ARTHROSCOPY W/ DEBRIDEMENT  09-09-2003  . LEFT SHOULDER ARTHROSCOPY/ LEFT THUMB TRIGGER RELEASE  02-22-2005  . PHOTOCOAGULATION WITH LASER Right 03/20/2018   Procedure: PHOTOCOAGULATION WITH LASER;  Surgeon: Carmela Rima, MD;  Location: [facility] OR;  Service: Ophthalmology;  Laterality: Right;  . PULLEY RELEASE LEFT LONG FINGER  07-14-2009  . RADIAL HEAD ARTHROPLASTY Right 06/17/2018   Procedure: RADIAL HEAD ARTHROPLASTY;  Surgeon: Bjorn Pippin, MD;  Location: [facility];  Service: Orthopedics;  Laterality: Right;  . RIGHT CARPAL TUNNEL/ RIGHT THUMB TRIGGER RELEASE'S  11-28-2006  . RIGHT SHOULDER ARTHROSCOPY W/ ROTATOR CUFF REPAIR  01-13-2004  . SHOULDER ARTHROSCOPY DISTAL CLAVICLE EXCISION AND OPEN ROTATOR CUFF REPAIR  09-07-2004   LEFT  . SHOULDER ARTHROSCOPY W/ ACROMIAL REPAIR  11-29-2005   LEFT  . TONSILLECTOMY AND ADENOIDECTOMY  child  . TOTAL KNEE ARTHROPLASTY  12-14-2009   RIGHT  . TOTAL KNEE ARTHROPLASTY Left 11/05/2012   Procedure: LEFT TOTAL KNEE ARTHROPLASTY;  Surgeon: Loanne Drilling, MD;  Location: [facility];  Service: Orthopedics;  Laterality: Left;  . TOTAL KNEE REVISION Right 07/29/2016   Procedure: RIGHT KNEE POLY-LINER EXCHANGE;  Surgeon: Kathryne HitchBlackman, Christopher Y, MD;  Location: [facility];  Service: Orthopedics;  Laterality: Right;  . TRIGGER FINGER RELEASE Right 02/21/2013   Procedure: RIGHT RING A-1 PULLEY RELEASE    (MINOR PROCEDURE) ;  Surgeon: Wyn Forsterobert V Sypher Jr., MD;  [facility];  Service: Orthopedics;  Laterality: Right;  . TRIGGER FINGER RELEASE Left 09/22/2016   Procedure: RELEASE TRIGGER FINGER LEFT INDEX FINGER;  Surgeon: Kathryne HitchBlackman, Christopher Y, MD;  Location: [facility];  Service: Orthopedics;  Laterality: Left;  . TRIGGER FINGER RELEASE Right 01/20/2017   Procedure: RELEASE TRIGGER FINGER/A-1 PULLEY RIGHT INDEX FINGER;  Surgeon: Marcene Corningalldorf, Peter, MD;  Location: [facility];  Service: Orthopedics;  Laterality: Right;  . VITRECTOMY 25 GAUGE WITH SCLERAL BUCKLE Right 03/20/2018   Procedure: RIGHT EYE VITRECTOMY WITH  ENDOLASER PARENTAL PHOTOCOAGULATION 25 GAUGE;  Surgeon: Carmela RimaPatel, Narendra, MD;  [facility];  Service: Ophthalmology;  Laterality: Right;    There were no vitals filed for this visit.  Subjective Assessment - 08/16/18 1033    Subjective  I am feeling stiff today.      Pertinent History  elbow surgery: coronoid ORIF/radial head arhtroplasty/lateral ligament repair on 06/16/18  avoid elbow varus, avoid shoulder abduction in upright position    Patient Stated Goals  return to regular use of the Rt UE, improve strength    Currently in Pain?  Yes    Pain Score  3     Pain Location  Elbow    Pain Orientation  Right    Pain Descriptors / Indicators  Aching;Tightness    Pain Onset  More than a month ago    Pain Frequency  Constant         [facility] PT Assessment - 08/16/18 0001      Strength   Right Hand Grip (lbs)  25                   [facility] Adult PT Treatment/Exercise - 08/16/18 0001      Shoulder Exercises: Seated   Flexion  Strengthening;Right;Left;20 reps;Weights   1lb   Other Seated Exercises  UE ranger: seated flexion x 3 minutes      Shoulder Exercises: Standing   Other Standing Exercises  cone stack to 1st shelf with pronation x 3 minutes       Shoulder Exercises: Pulleys   Flexion  3 minutes      Shoulder Exercises: ROM/Strengthening   UBE (Upper Arm Bike)  L1 - 3/3 fwd/back      Wrist Exercises   Wrist Extension  Strengthening;Right;20 reps;Seated    Bar Weights/Barbell (Wrist Extension)  1 lb    Wrist Radial Deviation  Strengthening;Right;20 reps    Bar Weights/Barbell (Radial Deviation)  1 lb    Other wrist exercises  grip with 3lb -3 minutes      Cryotherapy   Number Minutes Cryotherapy  10 Minutes    Cryotherapy Location  --   elbow   Type of Cryotherapy  Ice pack      Manual Therapy    Manual Therapy  Soft tissue mobilization;Myofascial release;Joint mobilization    Manual therapy comments  Rt forearm and scar mobs    Joint Mobilization  wrist mobs in all directions               PT Short Term Goals - 08/16/18  1035      PT SHORT TERM GOAL #3   Title  report < or = to 2/10 Rt UE pain with use at home    Baseline  up to 7/10, usually 2-3/10    Time  4    Period  Weeks    Status  On-going        PT Long Term Goals - 08/08/18 0956      PT LONG TERM GOAL #1   Status  New    Target Date  10/03/18      PT LONG TERM GOAL #2   Title  reduce FOTO to < or = 43% limitation    Baseline  --    Time  8    Period  Weeks    Status  New    Target Date  10/03/18      PT LONG TERM GOAL #3   Title  demonstrate > or = to 55# to hold objects safely    Baseline  --    Time  8    Period  Weeks    Status  New    Target Date  10/03/18      PT LONG TERM GOAL #4   Title  demonstrate > or = to 4/5 Rt elbow strength to improve endurance with use    Time  8    Period  Weeks    Status  New    Target Date  10/03/18      PT LONG TERM GOAL #5   Title  report > or = to 70% use of Rt UE for home, work and self-care tasks    Baseline  --    Time  8    Period  Weeks    Status  New    Target Date  09/26/18            Plan - 08/16/18 1039    Clinical Impression Statement  Pt reports 2-3/10 Rt arm pain with use and sometimes up to 7/10.  Pt continues to report stiffness in the Rt UE.  Pt is able to tolerate exercise within protocol limits without limitation.  Pt does demonstrate reduced endurance with overhead tasks.  Rt grip strength incresaed to 25#, still significantly limited vs the Lt. Pt will continue to benefit from skilled PT for Rt UE strength, endurance and flexibility to improve use and strength safely s/p surgery.      Rehab Potential  Excellent    PT Frequency  2x / week    PT Duration  8 weeks    PT Treatment/Interventions  ADLs/Self Care Home  Management;Cryotherapy;Electrical Stimulation;Moist Heat;Functional mobility training;Therapeutic activities;Therapeutic exercise;Patient/family education;Manual techniques;Vasopneumatic Device;Taping;Passive range of motion;Scar mobilization    PT Next Visit Plan  follow protocol-pulleys, arm bike, grip, ROM, wrist strength    PT Home Exercise Plan  Access Code: [email or web address]     Consulted and Agree with Plan of Care  Patient       Patient will benefit from skilled therapeutic intervention in order to improve the following deficits and impairments:  Abnormal gait, Impaired flexibility, Impaired UE functional use, Decreased endurance, Decreased activity tolerance, Decreased range of motion, Decreased strength, Pain  Visit Diagnosis: Stiffness of right elbow, not elsewhere classified  Pain in right elbow  Muscle weakness (generalized)  Localized edema     Problem List Patient Active Problem List   Diagnosis Date Noted  . Radial head fracture, closed 06/16/2018  . Closed fracture dislocation of elbow  06/16/2018  . Other fatigue 07/18/2017  . Shortness of breath on exertion 07/18/2017  . Type 2 diabetes mellitus with retinopathy, with long-term current use of insulin (HCC) 07/18/2017  . Vitamin D deficiency 07/18/2017  . B12 nutritional deficiency 07/18/2017  . Other specified hypothyroidism 07/18/2017  . Acute pain of right knee 12/22/2016  . Trigger index finger of left hand 09/22/2016  . 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 Diabetes (HCC) 01/08/2014  . Essential hypertension 01/08/2014  . Hyperlipidemia 01/08/2014  . Postoperative anemia due to acute blood loss 11/22/2012  . Hyponatremia 11/06/2012  . OA (osteoarthritis) of knee 11/05/2012  . Villonodular synovitis of knee 04/20/2011    Lorrene Reid, PT 08/16/18 11:23  AM  [facility] [address] [place], [place], [address] Phone: [phone]   Fax:  [phone]  Name: DULCIE GAMMON MRN: [id number] Date of Birth: January 07, 1951

## 2018-08-17 MED FILL — LOSARTAN POTASSIUM 100 MG T: 100 | 30 days supply | Qty: 30 | Fill #0

## 2018-08-20 MED FILL — LANTUS 100 UNITS/ML VIAL: 100 | 75 days supply | Qty: 30 | Fill #0

## 2018-08-20 MED FILL — XARELTO 20 MG TABLET: 20 | 90 days supply | Qty: 90 | Fill #1

## 2018-08-20 MED FILL — ATORVASTATIN 40 MG TABLET: 40 | 90 days supply | Qty: 90 | Fill #0

## 2018-08-20 MED FILL — DULOXETINE HCL 60 MG CPEP: 60 | 30 days supply | Qty: 30 | Fill #1

## 2018-08-21 ENCOUNTER — Ambulatory Visit: Payer: 59

## 2018-08-21 ENCOUNTER — Other Ambulatory Visit: Payer: Self-pay

## 2018-08-21 DIAGNOSIS — M25521 Pain in right elbow: Secondary | ICD-10-CM

## 2018-08-21 DIAGNOSIS — M25621 Stiffness of right elbow, not elsewhere classified: Secondary | ICD-10-CM | POA: Diagnosis not present

## 2018-08-21 DIAGNOSIS — M6281 Muscle weakness (generalized): Secondary | ICD-10-CM

## 2018-08-21 DIAGNOSIS — R600 Localized edema: Secondary | ICD-10-CM

## 2018-08-21 NOTE — Therapy (Signed)
[facility] [address], [address] [place], [place], [address] Phone: [phone]   Fax:  [phone]  Physical Therapy Treatment  Patient Details  Name: Stefanie DeemBonnie D Zambito MRN: [id number] Date of Birth: 03/05/1951 Referring Provider (PT): Ramond MarrowVarkey, Dax, MD   Encounter Date: 08/21/2018  PT End of Session - 08/21/18 1119    Visit Number  5    Date for PT Re-Evaluation  10/03/18    Authorization Type  UMR    PT Start Time  1033    PT Stop Time  1125    PT Time Calculation (min)  52 min    Activity Tolerance  Patient tolerated treatment well    Behavior During Therapy  [facility] for tasks assessed/performed       Past Medical History:  Diagnosis Date  . A-fib (HCC)   . Anemia    hx of  . Arthritis   . Chest pain   . Constipation   . Depression   . Diabetes mellitus ORAL MEDS  . Diabetic retinopathy (HCC)   . Dyspnea    with minimal exertion-deconditioned  . Dyspnea   . Dysrhythmia    a-fib  . Food allergy   . GERD (gastroesophageal reflux disease)    occasional  . Gout   . History of kidney stones   . Hypercholesteremia   . Hyperlipidemia   . Hypertension   . Hypertensive kidney disease   . Hypothyroidism   . Knee pain, right   . 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 Obesity   . OSA on CPAP    uses CPAP nightly  . Osteoarthritis   . Palpitations   . Pinched nerve in neck   . Pleurisy   . Pneumonia    hx of  . PONV (postoperative nausea and vomiting)    for 3-4 days after general anesthesia  . Sciatica   . Swelling of knee joint, right   . Synovitis of knee RIGHT  . Trigger finger, left    left index  . Vitamin D deficiency     Past Surgical History:  Procedure Laterality Date  . CESAREAN SECTION  X3  . KNEE ARTHROSCOPY  04/20/2011   Procedure: ARTHROSCOPY KNEE;  Surgeon: Loanne DrillingFrank V Aluisio, MD;  [facility] [place];  Service: Orthopedics;  Laterality: Right;  WITH SYNOVECTOMY  .  LEFT CARPAL TUNNEL / LEFT MIDDLE & RING FINGER TRIGGER RELEASE  08-26-2008  . LEFT SHOULDER ARTHROSCOPY W/ DEBRIDEMENT  09-09-2003  . LEFT SHOULDER ARTHROSCOPY/ LEFT THUMB TRIGGER RELEASE  02-22-2005  . PHOTOCOAGULATION WITH LASER Right 03/20/2018   Procedure: PHOTOCOAGULATION WITH LASER;  Surgeon: Carmela RimaPatel, Narendra, MD;  [facility];  Service: Ophthalmology;  Laterality: Right;  . PULLEY RELEASE LEFT LONG FINGER  07-14-2009  . RADIAL HEAD ARTHROPLASTY Right 06/17/2018   Procedure: RADIAL HEAD ARTHROPLASTY;  Surgeon: Bjorn PippinVarkey, Dax T, MD;  [facility];  Service: Orthopedics;  Laterality: Right;  . RIGHT CARPAL TUNNEL/ RIGHT THUMB TRIGGER RELEASE'S  11-28-2006  . RIGHT SHOULDER ARTHROSCOPY W/ ROTATOR CUFF REPAIR  01-13-2004  . SHOULDER ARTHROSCOPY DISTAL CLAVICLE EXCISION AND OPEN ROTATOR CUFF REPAIR  09-07-2004   LEFT  . SHOULDER ARTHROSCOPY W/ ACROMIAL REPAIR  11-29-2005   LEFT  . TONSILLECTOMY AND ADENOIDECTOMY  child  . TOTAL KNEE ARTHROPLASTY  12-14-2009   RIGHT  . TOTAL KNEE ARTHROPLASTY Left 11/05/2012   Procedure: LEFT TOTAL KNEE ARTHROPLASTY;  Surgeon: Loanne DrillingFrank V Aluisio, MD;  [facility];  Service: Orthopedics;  Laterality: Left;  . TOTAL KNEE REVISION Right 07/29/2016   Procedure: RIGHT KNEE POLY-LINER EXCHANGE;  Surgeon: Kathryne HitchBlackman, Christopher Y, MD;  [facility]: [facility];  Service: Orthopedics;  Laterality: Right;  . TRIGGER FINGER RELEASE Right 02/21/2013   Procedure: RIGHT RING A-1 PULLEY RELEASE    (MINOR PROCEDURE) ;  Surgeon: Wyn Forsterobert V Sypher Jr., MD;  [facility] [place];  Service: Orthopedics;  Laterality: Right;  . TRIGGER FINGER RELEASE Left 09/22/2016   Procedure: RELEASE TRIGGER FINGER LEFT INDEX FINGER;  Surgeon: Kathryne HitchBlackman, Christopher Y, MD;  [facility];  Service: Orthopedics;  Laterality: Left;  . TRIGGER FINGER RELEASE Right 01/20/2017   Procedure: RELEASE TRIGGER FINGER/A-1 PULLEY RIGHT INDEX FINGER;  Surgeon: Marcene Corningalldorf, Peter, MD;  [facility];  Service: Orthopedics;  Laterality: Right;  . VITRECTOMY 25 GAUGE WITH SCLERAL BUCKLE Right 03/20/2018   Procedure: RIGHT EYE VITRECTOMY WITH  ENDOLASER PARENTAL PHOTOCOAGULATION 25 GAUGE;  Surgeon: Carmela RimaPatel, Narendra, MD;  [facility];  Service: Ophthalmology;  Laterality: Right;    There were no vitals filed for this visit.  Subjective Assessment - 08/21/18 1034    Subjective  My wrist is really bothering me.      Pertinent History  elbow surgery: coronoid ORIF/radial head arhtroplasty/lateral ligament repair on 06/16/18  avoid elbow varus, avoid shoulder abduction in upright position    Currently in Pain?  Yes    Pain Score  3     Pain Location  Elbow    Pain Orientation  Right    Pain Descriptors / Indicators  Aching;Tightness    Pain Type  Surgical pain    Pain Radiating Towards  wrist 5/10    Pain Onset  More than a month ago    Pain Frequency  Constant    Aggravating Factors   writing, use of Rt UE    Pain Relieving Factors  rest, ice, keeping arm close to side                       [facility] Adult PT Treatment/Exercise - 08/21/18 0001      Shoulder Exercises: Seated   Other Seated Exercises  UE ranger: seated flexion x 3 minutes      Shoulder Exercises: Standing   Other Standing Exercises  cone stack to 1st shelf with pronation x 3 minutes       Shoulder Exercises: ROM/Strengthening   UBE (Upper Arm Bike)  L1 - 3/3 fwd/back      Wrist Exercises   Wrist Extension  Strengthening;Right;20 reps;Seated    Bar Weights/Barbell (Wrist Extension)  1 lb    Wrist Radial Deviation  Strengthening;Right;20 reps    Bar Weights/Barbell (Radial Deviation)  1 lb    Other wrist exercises  grip with 3lb -3 minutes    Other wrist exercises  wrist extension/flexion using velcro board (thick strip)x 6 reps      Cryotherapy   Number Minutes Cryotherapy  10 Minutes    Cryotherapy Location  --   Rt elbow   Type of Cryotherapy  Ice pack      Manual Therapy    Manual Therapy  Soft tissue mobilization;Myofascial release;Joint mobilization    Manual therapy comments  Rt forearm and scar mobs    Joint Mobilization  wrist mobs in all directions               PT Short Term Goals - 08/16/18 1035  PT SHORT TERM GOAL #3   Title  report < or = to 2/10 Rt UE pain with use at home    Baseline  up to 7/10, usually 2-3/10    Time  4    Period  Weeks    Status  On-going        PT Long Term Goals - 08/08/18 0956      PT LONG TERM GOAL #1   Status  New    Target Date  10/03/18      PT LONG TERM GOAL #2   Title  reduce FOTO to < or = 43% limitation    Baseline  --    Time  8    Period  Weeks    Status  New    Target Date  10/03/18      PT LONG TERM GOAL #3   Title  demonstrate > or = to 55# to hold objects safely    Baseline  --    Time  8    Period  Weeks    Status  New    Target Date  10/03/18      PT LONG TERM GOAL #4   Title  demonstrate > or = to 4/5 Rt elbow strength to improve endurance with use    Time  8    Period  Weeks    Status  New    Target Date  10/03/18      PT LONG TERM GOAL #5   Title  report > or = to 70% use of Rt UE for home, work and self-care tasks    Baseline  --    Time  8    Period  Weeks    Status  New    Target Date  09/26/18            Plan - 08/21/18 1051    Clinical Impression Statement  Pt with continued Rt wrist pain that is more limiting than the elbow.  Pt tolerates all clinic exercises well.  Pt has fatigue with Velcro board and demonstrates scapular elevation with fatigue.  Pt responds well to soft tissue and has trigger point in Rt wrist extensor tendons.  Pt will continue to benefit from skilled PT for safe progression of Rt UE A/ROM and strength s/p surgery.      Rehab Potential  Excellent    PT Frequency  2x / week    PT Duration  8 weeks    PT Treatment/Interventions  ADLs/Self Care Home Management;Cryotherapy;Electrical Stimulation;Moist Heat;Functional mobility  training;Therapeutic activities;Therapeutic exercise;Patient/family education;Manual techniques;Vasopneumatic Device;Taping;Passive range of motion;Scar mobilization    PT Next Visit Plan  follow protocol-manual, arm bike, grip, ROM, wrist strength    PT Home Exercise Plan  Access Code: [email or web address]     Consulted and Agree with Plan of Care  Patient       Patient will benefit from skilled therapeutic intervention in order to improve the following deficits and impairments:     Visit Diagnosis: Stiffness of right elbow, not elsewhere classified  Pain in right elbow  Muscle weakness (generalized)  Localized edema     Problem List Patient Active Problem List   Diagnosis Date Noted  . Radial head fracture, closed 06/16/2018  . Closed fracture dislocation of elbow 06/16/2018  . Other fatigue 07/18/2017  . Shortness of breath on exertion 07/18/2017  . Type 2 diabetes mellitus with retinopathy, with long-term current use of insulin (HCC) 07/18/2017  . Vitamin D deficiency  07/18/2017  . B12 nutritional deficiency 07/18/2017  . Other specified hypothyroidism 07/18/2017  . Acute pain of right knee 12/22/2016  . Trigger index finger of left hand 09/22/2016  . 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 Diabetes ([place]) 01/08/2014  . Essential hypertension 01/08/2014  . Hyperlipidemia 01/08/2014  . Postoperative anemia due to acute blood loss 11/22/2012  . Hyponatremia 11/06/2012  . OA (osteoarthritis) of knee 11/05/2012  . Villonodular synovitis of knee 04/20/2011    Sigurd Sos, PT 08/21/18 11:22 AM  [facility] [address] [place], [place], [address] Phone: [phone]   Fax:  [phone]  Name: EILY LOUVIER MRN: [id number] Date of Birth: 16-Feb-1951

## 2018-08-22 DIAGNOSIS — G4733 Obstructive sleep apnea (adult) (pediatric): Secondary | ICD-10-CM | POA: Diagnosis not present

## 2018-08-23 ENCOUNTER — Other Ambulatory Visit: Payer: Self-pay

## 2018-08-23 ENCOUNTER — Ambulatory Visit: Payer: 59

## 2018-08-23 DIAGNOSIS — M25621 Stiffness of right elbow, not elsewhere classified: Secondary | ICD-10-CM | POA: Diagnosis not present

## 2018-08-23 DIAGNOSIS — M25521 Pain in right elbow: Secondary | ICD-10-CM

## 2018-08-23 DIAGNOSIS — R600 Localized edema: Secondary | ICD-10-CM

## 2018-08-23 DIAGNOSIS — M6281 Muscle weakness (generalized): Secondary | ICD-10-CM

## 2018-08-23 NOTE — Therapy (Signed)
[facility] [address] [place], [place], [address] Phone: [phone]   Fax:  [phone]  Physical Therapy Treatment  Patient Details  Name: [name] MRN: [id number] Date of Birth: 02/04/1951 Referring Provider (PT): Ophelia Charter, MD   Encounter Date: 08/23/2018  PT End of Session - 08/23/18 1119    Visit Number  6    Date for PT Re-Evaluation  10/03/18    Authorization Type  UMR    PT Start Time  1032    PT Stop Time  1126    PT Time Calculation (min)  54 min    Activity Tolerance  Patient tolerated treatment well    Behavior During Therapy  [facility] for tasks assessed/performed       Past Medical History:  Diagnosis Date  . A-fib ([place])   . Anemia    hx of  . Arthritis   . Chest pain   . Constipation   . Depression   . Diabetes mellitus ORAL [facility]  . Diabetic retinopathy ([place])   . Dyspnea    with minimal exertion-deconditioned  . Dyspnea   . Dysrhythmia    a-fib  . Food allergy   . GERD (gastroesophageal reflux disease)    occasional  . Gout   . History of kidney stones   . Hypercholesteremia   . Hyperlipidemia   . Hypertension   . Hypertensive kidney disease   . Hypothyroidism   . Knee pain, right   . 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 Obesity   . OSA on CPAP    uses CPAP nightly  . Osteoarthritis   . Palpitations   . Pinched nerve in neck   . Pleurisy   . Pneumonia    hx of  . PONV (postoperative nausea and vomiting)    for 3-4 days after general anesthesia  . Sciatica   . Swelling of knee joint, right   . Synovitis of knee RIGHT  . Trigger finger, left    left index  . Vitamin D deficiency     Past Surgical History:  Procedure Laterality Date  . CESAREAN SECTION  X3  . KNEE ARTHROSCOPY  04/20/2011   Procedure: ARTHROSCOPY KNEE;  Surgeon: Gearlean Alf, MD;  [facility];  Service: Orthopedics;  Laterality: Right;  WITH SYNOVECTOMY   . LEFT CARPAL TUNNEL / LEFT MIDDLE & RING FINGER TRIGGER RELEASE  08-26-2008  . LEFT SHOULDER ARTHROSCOPY W/ DEBRIDEMENT  09-09-2003  . LEFT SHOULDER ARTHROSCOPY/ LEFT THUMB TRIGGER RELEASE  02-22-2005  . PHOTOCOAGULATION WITH LASER Right 03/20/2018   Procedure: PHOTOCOAGULATION WITH LASER;  Surgeon: Jalene Mullet, MD;  Location: [place];  Service: Ophthalmology;  Laterality: Right;  . PULLEY RELEASE LEFT LONG FINGER  07-14-2009  . RADIAL HEAD ARTHROPLASTY Right 06/17/2018   Procedure: RADIAL HEAD ARTHROPLASTY;  Surgeon: Hiram Gash, MD;  Location: [facility];  Service: Orthopedics;  Laterality: Right;  . RIGHT CARPAL TUNNEL/ RIGHT THUMB TRIGGER RELEASE'S  11-28-2006  . RIGHT SHOULDER ARTHROSCOPY W/ ROTATOR CUFF REPAIR  01-13-2004  . SHOULDER ARTHROSCOPY DISTAL CLAVICLE EXCISION AND OPEN ROTATOR CUFF REPAIR  09-07-2004   LEFT  . SHOULDER ARTHROSCOPY W/ ACROMIAL REPAIR  11-29-2005   LEFT  . TONSILLECTOMY AND ADENOIDECTOMY  child  . TOTAL KNEE ARTHROPLASTY  12-14-2009   RIGHT  . TOTAL KNEE ARTHROPLASTY Left 11/05/2012   Procedure: LEFT TOTAL KNEE ARTHROPLASTY;  Surgeon: Gearlean Alf, MD;  [facility];  Service: Orthopedics;  Laterality: Left;  . TOTAL KNEE REVISION Right 07/29/2016   Procedure: RIGHT KNEE POLY-LINER EXCHANGE;  Surgeon: Kathryne HitchBlackman, Christopher Y, MD;  [facility];  Service: Orthopedics;  Laterality: Right;  . TRIGGER FINGER RELEASE Right 02/21/2013   Procedure: RIGHT RING A-1 PULLEY RELEASE    (MINOR PROCEDURE) ;  Surgeon: Wyn Forsterobert V Sypher Jr., MD;  [facility] [place];  Service: Orthopedics;  Laterality: Right;  . TRIGGER FINGER RELEASE Left 09/22/2016   Procedure: RELEASE TRIGGER FINGER LEFT INDEX FINGER;  Surgeon: Kathryne HitchBlackman, Christopher Y, MD;  [facility];  Service: Orthopedics;  Laterality: Left;  . TRIGGER FINGER RELEASE Right 01/20/2017   Procedure: RELEASE TRIGGER FINGER/A-1 PULLEY RIGHT INDEX FINGER;  Surgeon: Marcene Corningalldorf, Peter, MD;  [facility]  [place];  Service: Orthopedics;  Laterality: Right;  . VITRECTOMY 25 GAUGE WITH SCLERAL BUCKLE Right 03/20/2018   Procedure: RIGHT EYE VITRECTOMY WITH  ENDOLASER PARENTAL PHOTOCOAGULATION 25 GAUGE;  Surgeon: Carmela RimaPatel, Narendra, MD;  [facility];  Service: Ophthalmology;  Laterality: Right;    There were no vitals filed for this visit.  Subjective Assessment - 08/23/18 1034    Subjective  My wrist feels like I have carpal tunnel.  I can't figure out what motions trigger the wrist pain.    Currently in Pain?  Yes    Pain Score  3     Pain Location  Elbow         [facility] PT Assessment - 08/23/18 0001      Strength   Right Hand Grip (lbs)  35                   [facility] Adult PT Treatment/Exercise - 08/23/18 0001      Shoulder Exercises: Standing   Flexion  AAROM;Right;20 reps   using UE ranger   Other Standing Exercises  cone stack to 1st shelf with pronation x 3 minutes       Shoulder Exercises: ROM/Strengthening   UBE (Upper Arm Bike)  L1 - 3/3 fwd/back      Wrist Exercises   Wrist Extension  Strengthening;Right;20 reps;Seated    Bar Weights/Barbell (Wrist Extension)  1 lb    Wrist Radial Deviation  Strengthening;Right;20 reps    Bar Weights/Barbell (Radial Deviation)  1 lb    Other wrist exercises  grip with 3lb -3 minutes    Other wrist exercises  wrist extension/flexion using velcro board (thick strip)x 6 reps      Cryotherapy   Number Minutes Cryotherapy  10 Minutes    Type of Cryotherapy  Ice pack      Manual Therapy   Manual Therapy  Taping    Kinesiotex  Create Space      Kinesiotix   Create Space  strip from dorsal hand and lateral epicondyle without stretch.  2 I strips across wrist with 50% stretch- 1 on dorsal and 1 on ventral surface             PT Education - 08/23/18 1036    Education Details  avoid reaching out to the side    Person(s) Educated  Patient    Methods  Explanation    Comprehension  Verbalized understanding        PT Short Term Goals - 08/23/18 1037      PT SHORT TERM GOAL #2   Title  demonstrate Rt grip to > or = to 40# to hold objects safely    Baseline  35#    Time  4    Period  Weeks    Status  On-going      PT SHORT TERM GOAL #3   Title  report < or = to 2/10 Rt UE pain with use at home    Baseline  3-6/10        PT Long Term Goals - 08/08/18 0956      PT LONG TERM GOAL #1   Status  New    Target Date  10/03/18      PT LONG TERM GOAL #2   Title  reduce FOTO to < or = 43% limitation    Baseline  --    Time  8    Period  Weeks    Status  New    Target Date  10/03/18      PT LONG TERM GOAL #3   Title  demonstrate > or = to 55# to hold objects safely    Baseline  --    Time  8    Period  Weeks    Status  New    Target Date  10/03/18      PT LONG TERM GOAL #4   Title  demonstrate > or = to 4/5 Rt elbow strength to improve endurance with use    Time  8    Period  Weeks    Status  New    Target Date  10/03/18      PT LONG TERM GOAL #5   Title  report > or = to 70% use of Rt UE for home, work and self-care tasks    Baseline  --    Time  8    Period  Weeks    Status  New    Target Date  09/26/18            Plan - 08/23/18 1039    Clinical Impression Statement  Pt is making steady progress with post-op recovery of Rt elbow.  Pt is able to move Rt UE against gravity in protected position with good elbow tracking.  Pt reports continued Rt wrist pain that limits function at home especially with writing.  Pt rates Rt elbow and wrist pain as 3-6/10 with use.  PT educated pt to avoid motions where she is reaching out to the side to avoid varus stress on the elbow.  Pt will continue to benefit from skilled PT for safe progression of movement and use of Rt UE s/p elbow surgery.    Rehab Potential  Excellent    PT Frequency  2x / week    PT Duration  8 weeks    PT Treatment/Interventions  ADLs/Self Care Home Management;Cryotherapy;Electrical Stimulation;Moist  Heat;Functional mobility training;Therapeutic activities;Therapeutic exercise;Patient/family education;Manual techniques;Vasopneumatic Device;Taping;Passive range of motion;Scar mobilization    PT Next Visit Plan  follow protocol-manual, arm bike, grip, ROM, wrist strength    PT Home Exercise Plan  Access Code: [email or web address]     Consulted and Agree with Plan of Care  Patient       Patient will benefit from skilled therapeutic intervention in order to improve the following deficits and impairments:  Abnormal gait, Impaired flexibility, Impaired UE functional use, Decreased endurance, Decreased activity tolerance, Decreased range of motion, Decreased strength, Pain  Visit Diagnosis: Pain in right elbow  Stiffness of right elbow, not elsewhere classified  Muscle weakness (generalized)  Localized edema     Problem List Patient Active Problem List   Diagnosis Date Noted  .  Radial head fracture, closed 06/16/2018  . Closed fracture dislocation of elbow 06/16/2018  . Other fatigue 07/18/2017  . Shortness of breath on exertion 07/18/2017  . Type 2 diabetes mellitus with retinopathy, with long-term current use of insulin (HCC) 07/18/2017  . Vitamin D deficiency 07/18/2017  . B12 nutritional deficiency 07/18/2017  . Other specified hypothyroidism 07/18/2017  . Acute pain of right knee 12/22/2016  . Trigger index finger of left hand 09/22/2016  . 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 Diabetes (HCC) 01/08/2014  . Essential hypertension 01/08/2014  . Hyperlipidemia 01/08/2014  . Postoperative anemia due to acute blood loss 11/22/2012  . Hyponatremia 11/06/2012  . OA (osteoarthritis) of knee 11/05/2012  . Villonodular synovitis of knee 04/20/2011     Lorrene ReidKelly Takacs, PT 08/23/18 11:21 AM  [facility] [address], [address] [place], [place], [address] Phone: [phone]   Fax:  [phone]  Name: Alinda DeemBonnie D Vallee MRN: [id number] Date of Birth: 09-Sep-1950

## 2018-08-28 ENCOUNTER — Other Ambulatory Visit: Payer: Self-pay

## 2018-08-28 ENCOUNTER — Ambulatory Visit: Payer: 59

## 2018-08-28 DIAGNOSIS — M6281 Muscle weakness (generalized): Secondary | ICD-10-CM

## 2018-08-28 DIAGNOSIS — M25621 Stiffness of right elbow, not elsewhere classified: Secondary | ICD-10-CM | POA: Diagnosis not present

## 2018-08-28 DIAGNOSIS — M25521 Pain in right elbow: Secondary | ICD-10-CM

## 2018-08-28 DIAGNOSIS — R600 Localized edema: Secondary | ICD-10-CM

## 2018-08-28 NOTE — Therapy (Signed)
[facility] [address], [address] [place], [place], [address] Phone: [phone]   Fax:  [phone]  Physical Therapy Treatment  Patient Details  Name: [name] MRN: [id number] Date of Birth: 22-Oct-1950 Referring Provider (PT): Ramond MarrowVarkey, Dax, MD   Encounter Date: 08/28/2018  PT End of Session - 08/28/18 1119    Visit Number  7    Date for PT Re-Evaluation  10/03/18    Authorization Type  UMR    PT Start Time  1030    PT Stop Time  1125    PT Time Calculation (min)  55 min    Activity Tolerance  Patient tolerated treatment well    Behavior During Therapy  [facility] for tasks assessed/performed       Past Medical History:  Diagnosis Date  . A-fib (HCC)   . Anemia    hx of  . Arthritis   . Chest pain   . Constipation   . Depression   . Diabetes mellitus [facility]  . Diabetic retinopathy (HCC)   . Dyspnea    with minimal exertion-deconditioned  . Dyspnea   . Dysrhythmia    a-fib  . Food allergy   . GERD (gastroesophageal reflux disease)    occasional  . Gout   . History of kidney stones   . Hypercholesteremia   . Hyperlipidemia   . Hypertension   . Hypertensive kidney disease   . Hypothyroidism   . Knee pain, right   . 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 Obesity   . OSA on CPAP    uses CPAP nightly  . Osteoarthritis   . Palpitations   . Pinched nerve in neck   . Pleurisy   . Pneumonia    hx of  . PONV (postoperative nausea and vomiting)    for 3-4 days after general anesthesia  . Sciatica   . Swelling of knee joint, right   . Synovitis of knee RIGHT  . Trigger finger, left    left index  . Vitamin D deficiency     Past Surgical History:  Procedure Laterality Date  . CESAREAN SECTION  X3  . KNEE ARTHROSCOPY  04/20/2011   Procedure: ARTHROSCOPY KNEE;  Surgeon: Loanne DrillingFrank V Aluisio, MD;  [facility];  Service: Orthopedics;  Laterality: Right;  WITH SYNOVECTOMY   . LEFT CARPAL TUNNEL / LEFT MIDDLE & RING FINGER TRIGGER RELEASE  08-26-2008  . LEFT SHOULDER ARTHROSCOPY W/ DEBRIDEMENT  09-09-2003  . LEFT SHOULDER ARTHROSCOPY/ LEFT THUMB TRIGGER RELEASE  02-22-2005  . PHOTOCOAGULATION WITH LASER Right 03/20/2018   Procedure: PHOTOCOAGULATION WITH LASER;  Surgeon: Carmela RimaPatel, Narendra, MD;  [facility];  Service: Ophthalmology;  Laterality: Right;  . PULLEY RELEASE LEFT LONG FINGER  07-14-2009  . RADIAL HEAD ARTHROPLASTY Right 06/17/2018   Procedure: RADIAL HEAD ARTHROPLASTY;  Surgeon: Bjorn PippinVarkey, Dax T, MD;  [facility];  Service: Orthopedics;  Laterality: Right;  . RIGHT CARPAL TUNNEL/ RIGHT THUMB TRIGGER RELEASE'S  11-28-2006  . RIGHT SHOULDER ARTHROSCOPY W/ ROTATOR CUFF REPAIR  01-13-2004  . SHOULDER ARTHROSCOPY DISTAL CLAVICLE EXCISION AND OPEN ROTATOR CUFF REPAIR  09-07-2004   LEFT  . SHOULDER ARTHROSCOPY W/ ACROMIAL REPAIR  11-29-2005   LEFT  . TONSILLECTOMY AND ADENOIDECTOMY  child  . TOTAL KNEE ARTHROPLASTY  12-14-2009   RIGHT  . TOTAL KNEE ARTHROPLASTY Left 11/05/2012   Procedure: LEFT TOTAL KNEE ARTHROPLASTY;  Surgeon: Loanne DrillingFrank V Aluisio, MD;  [facility];  Service: Orthopedics;  Laterality: Left;  . TOTAL KNEE REVISION Right 07/29/2016   Procedure: RIGHT KNEE POLY-LINER EXCHANGE;  Surgeon: Kathryne HitchBlackman, Christopher Y, MD;  Location: [facility];  Service: Orthopedics;  Laterality: Right;  . TRIGGER FINGER RELEASE Right 02/21/2013   Procedure: RIGHT RING A-1 PULLEY RELEASE    (MINOR PROCEDURE) ;  Surgeon: Wyn Forsterobert V Sypher Jr., MD;  [facility];  Service: Orthopedics;  Laterality: Right;  . TRIGGER FINGER RELEASE Left 09/22/2016   Procedure: RELEASE TRIGGER FINGER LEFT INDEX FINGER;  Surgeon: Kathryne HitchBlackman, Christopher Y, MD;  [facility];  Service: Orthopedics;  Laterality: Left;  . TRIGGER FINGER RELEASE Right 01/20/2017   Procedure: RELEASE TRIGGER FINGER/A-1 PULLEY RIGHT INDEX FINGER;  Surgeon: Marcene Corningalldorf, Peter, MD;  [facility]  ;  Service: Orthopedics;  Laterality: Right;  . VITRECTOMY 25 GAUGE WITH SCLERAL BUCKLE Right 03/20/2018   Procedure: RIGHT EYE VITRECTOMY WITH  ENDOLASER PARENTAL PHOTOCOAGULATION 25 GAUGE;  Surgeon: Carmela RimaPatel, Narendra, MD;  [facility];  Service: Ophthalmology;  Laterality: Right;    There were no vitals filed for this visit.  Subjective Assessment - 08/28/18 1041    Subjective  The tape really helped my wrist to feel better.  I had 2-3 good days regarding my elbow pain. It has been more sore the past few days.    Patient Stated Goals  return to regular use of the Rt UE, improve strength    Currently in Pain?  Yes    Pain Score  3     Pain Location  Elbow    Pain Orientation  Right    Pain Descriptors / Indicators  Aching;Tightness    Pain Type  Surgical pain    Pain Onset  More than a month ago    Pain Frequency  Constant    Aggravating Factors   writing, use of Rt UE    Pain Relieving Factors  rest, ice, keeping arm close to the side                       [facility] Adult PT Treatment/Exercise - 08/28/18 0001      Shoulder Exercises: Seated   Other Seated Exercises  --      Shoulder Exercises: Standing   Flexion  AAROM;Right;20 reps   using UE ranger   Other Standing Exercises  cone stack to 1st shelf with pronation x 3 minutes       Shoulder Exercises: ROM/Strengthening   UBE (Upper Arm Bike)  L1 - 3/3 fwd/back      Wrist Exercises   Wrist Extension  Strengthening;Right;20 reps;Seated    Bar Weights/Barbell (Wrist Extension)  1 lb    Wrist Radial Deviation  Strengthening;Right;20 reps    Bar Weights/Barbell (Radial Deviation)  1 lb    Other wrist exercises  grip with 3lb -3 minutes    Other wrist exercises  wrist extension/flexion using velcro board (thick strip)x 6 reps      Cryotherapy   Number Minutes Cryotherapy  10 Minutes    Type of Cryotherapy  Ice pack      Manual Therapy   Manual Therapy  Taping    Kinesiotex  Create Space       Kinesiotix   Create Space  strip from dorsal hand and lateral epicondyle without stretch.  2 I strips across wrist with 50% stretch- 1 on dorsal and 1 on ventral surface.  I strip across Rt lateral elbow 50%  PT Short Term Goals - 08/28/18 1042      PT SHORT TERM GOAL #1   Title  be independent in initial HEP    Status  Achieved      PT SHORT TERM GOAL #2   Title  demonstrate Rt grip to > or = to 40# to hold objects safely    Baseline  35#    Status  Achieved      PT SHORT TERM GOAL #3   Title  report < or = to 2/10 Rt UE pain with use at home    Baseline  3-6/10        PT Long Term Goals - 08/08/18 0956      PT LONG TERM GOAL #1   Status  New    Target Date  10/03/18      PT LONG TERM GOAL #2   Title  reduce FOTO to < or = 43% limitation    Baseline  --    Time  8    Period  Weeks    Status  New    Target Date  10/03/18      PT LONG TERM GOAL #3   Title  demonstrate > or = to 55# to hold objects safely    Baseline  --    Time  8    Period  Weeks    Status  New    Target Date  10/03/18      PT LONG TERM GOAL #4   Title  demonstrate > or = to 4/5 Rt elbow strength to improve endurance with use    Time  8    Period  Weeks    Status  New    Target Date  10/03/18      PT LONG TERM GOAL #5   Title  report > or = to 70% use of Rt UE for home, work and self-care tasks    Baseline  --    Time  8    Period  Weeks    Status  New    Target Date  09/26/18            Plan - 08/28/18 1051    Clinical Impression Statement  Pt responded well to kinesiotape to Rt wrist with significant pain reduction since last session.  Rt elbow was feeling much better until yesterday.  Pt reports 3-6/10 Rt arm pain with use at home.  Grip strength is improved since the start of care.  Pt with improved endurance for exercise today in the clinic with reduced fatigue and pain reported with this.  Pt will continue to benefit from skilled PT for Rt UE  strength and flexibility within protocol restrictions.    PT Frequency  2x / week    PT Duration  8 weeks    PT Treatment/Interventions  ADLs/Self Care Home Management;Cryotherapy;Electrical Stimulation;Moist Heat;Functional mobility training;Therapeutic activities;Therapeutic exercise;Patient/family education;Manual techniques;Vasopneumatic Device;Taping;Passive range of motion;Scar mobilization    PT Next Visit Plan  follow protocol-manual, arm bike, grip, ROM, wrist strength    PT Home Exercise Plan  Access Code: [email or web address]     [email or web address] and Agree with Plan of Care  Patient       Patient will benefit from skilled therapeutic intervention in order to improve the following deficits and impairments:  Abnormal gait, Impaired flexibility, Impaired UE functional use, Decreased endurance, Decreased activity tolerance, Decreased range of motion, Decreased strength, Pain  Visit Diagnosis: 1. Stiffness of right elbow, not elsewhere  classified   2. Pain in right elbow   3. Muscle weakness (generalized)   4. Localized edema        Problem List Patient Active Problem List   Diagnosis Date Noted  . Radial head fracture, closed 06/16/2018  . Closed fracture dislocation of elbow 06/16/2018  . Other fatigue 07/18/2017  . Shortness of breath on exertion 07/18/2017  . Type 2 diabetes mellitus with retinopathy, with long-term current use of insulin (HCC) 07/18/2017  . Vitamin D deficiency 07/18/2017  . B12 nutritional deficiency 07/18/2017  . Other specified hypothyroidism 07/18/2017  . Acute pain of right knee 12/22/2016  . Trigger index finger of left hand 09/22/2016  . 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 Diabetes (HCC) 01/08/2014  . Essential hypertension 01/08/2014  . Hyperlipidemia 01/08/2014  . Postoperative anemia due to acute blood  loss 11/22/2012  . Hyponatremia 11/06/2012  . OA (osteoarthritis) of knee 11/05/2012  . Villonodular synovitis of knee 04/20/2011   Lorrene ReidKelly , PT 08/28/18 11:22 AM  [facility] [address], [address] [place], [place], [address] Phone: [phone]   Fax:  [phone]  Name: [name] MRN: [id number] Date of Birth: 1950-12-01

## 2018-08-30 ENCOUNTER — Ambulatory Visit: Payer: 59

## 2018-08-30 ENCOUNTER — Other Ambulatory Visit: Payer: Self-pay

## 2018-08-30 DIAGNOSIS — M25621 Stiffness of right elbow, not elsewhere classified: Secondary | ICD-10-CM | POA: Diagnosis not present

## 2018-08-30 DIAGNOSIS — M25521 Pain in right elbow: Secondary | ICD-10-CM

## 2018-08-30 DIAGNOSIS — M6281 Muscle weakness (generalized): Secondary | ICD-10-CM

## 2018-08-30 DIAGNOSIS — R600 Localized edema: Secondary | ICD-10-CM

## 2018-08-30 NOTE — Therapy (Signed)
[facility] [address] [place], [place], [address] Phone: [phone]   Fax:  [phone]  Physical Therapy Treatment  Patient Details  Name: [name] MRN: [id number] Date of Birth: [birth year] Referring Provider (PT): Ophelia Charter, MD   Encounter Date: 08/30/2018  PT End of Session - 08/30/18 1120    Visit Number  8    Date for PT Re-Evaluation  10/03/18    Authorization Type  UMR    PT Start Time  1031    PT Stop Time  1125    PT Time Calculation (min)  54 min    Activity Tolerance  Patient tolerated treatment well    Behavior During Therapy  [facility] for tasks assessed/performed       Past Medical History:  Diagnosis Date  . A-fib ([place])   . Anemia    hx of  . Arthritis   . Chest pain   . Constipation   . Depression   . Diabetes mellitus [facility]  . Diabetic retinopathy ([place])   . Dyspnea    with minimal exertion-deconditioned  . Dyspnea   . Dysrhythmia    a-fib  . Food allergy   . GERD (gastroesophageal reflux disease)    occasional  . Gout   . History of kidney stones   . Hypercholesteremia   . Hyperlipidemia   . Hypertension   . Hypertensive kidney disease   . Hypothyroidism   . Knee pain, right   . 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 Obesity   . OSA on CPAP    uses CPAP nightly  . Osteoarthritis   . Palpitations   . Pinched nerve in neck   . Pleurisy   . Pneumonia    hx of  . PONV (postoperative nausea and vomiting)    for 3-4 days after general anesthesia  . Sciatica   . Swelling of knee joint, right   . Synovitis of knee RIGHT  . Trigger finger, left    left index  . Vitamin D deficiency     Past Surgical History:  Procedure Laterality Date  . CESAREAN SECTION  X3  . KNEE ARTHROSCOPY  04/20/2011   Procedure: ARTHROSCOPY KNEE;  Surgeon: Gearlean Alf, MD;  Location: [facility];  Service: Orthopedics;  Laterality: Right;  WITH SYNOVECTOMY   . LEFT CARPAL TUNNEL / LEFT MIDDLE & RING FINGER TRIGGER RELEASE  08-26-2008  . LEFT SHOULDER ARTHROSCOPY W/ DEBRIDEMENT  09-09-2003  . LEFT SHOULDER ARTHROSCOPY/ LEFT THUMB TRIGGER RELEASE  02-22-2005  . PHOTOCOAGULATION WITH LASER Right 03/20/2018   Procedure: PHOTOCOAGULATION WITH LASER;  Surgeon: Jalene Mullet, MD;  Location: [employer];  Service: Ophthalmology;  Laterality: Right;  . PULLEY RELEASE LEFT LONG FINGER  07-14-2009  . RADIAL HEAD ARTHROPLASTY Right 06/17/2018   Procedure: RADIAL HEAD ARTHROPLASTY;  Surgeon: Hiram Gash, MD;  Location: [place];  Service: Orthopedics;  Laterality: Right;  . RIGHT CARPAL TUNNEL/ RIGHT THUMB TRIGGER RELEASE'S  11-28-2006  . RIGHT SHOULDER ARTHROSCOPY W/ ROTATOR CUFF REPAIR  01-13-2004  . SHOULDER ARTHROSCOPY DISTAL CLAVICLE EXCISION AND OPEN ROTATOR CUFF REPAIR  09-07-2004   LEFT  . SHOULDER ARTHROSCOPY W/ ACROMIAL REPAIR  11-29-2005   LEFT  . TONSILLECTOMY AND ADENOIDECTOMY  child  . TOTAL KNEE ARTHROPLASTY  12-14-2009   RIGHT  . TOTAL KNEE ARTHROPLASTY Left 11/05/2012   Procedure: LEFT TOTAL KNEE ARTHROPLASTY;  Surgeon: Gearlean Alf, MD;  [facility];  Service: Orthopedics;  Laterality: Left;  . TOTAL KNEE REVISION Right 07/29/2016   Procedure: RIGHT KNEE POLY-LINER EXCHANGE;  Surgeon: Kathryne HitchBlackman, Christopher Y, MD;  [facility];  Service: Orthopedics;  Laterality: Right;  . TRIGGER FINGER RELEASE Right 02/21/2013   Procedure: RIGHT RING A-1 PULLEY RELEASE    (MINOR PROCEDURE) ;  Surgeon: Wyn Forsterobert V Sypher Jr., MD;  [facility];  Service: Orthopedics;  Laterality: Right;  . TRIGGER FINGER RELEASE Left 09/22/2016   Procedure: RELEASE TRIGGER FINGER LEFT INDEX FINGER;  Surgeon: Kathryne HitchBlackman, Christopher Y, MD;  [facility];  Service: Orthopedics;  Laterality: Left;  . TRIGGER FINGER RELEASE Right 01/20/2017   Procedure: RELEASE TRIGGER FINGER/A-1 PULLEY RIGHT INDEX FINGER;  Surgeon: Marcene Corningalldorf, Peter, MD;  [facility]  [place];  Service: Orthopedics;  Laterality: Right;  . VITRECTOMY 25 GAUGE WITH SCLERAL BUCKLE Right 03/20/2018   Procedure: RIGHT EYE VITRECTOMY WITH  ENDOLASER PARENTAL PHOTOCOAGULATION 25 GAUGE;  Surgeon: Carmela RimaPatel, Narendra, MD;  [facility];  Service: Ophthalmology;  Laterality: Right;    There were no vitals filed for this visit.  Subjective Assessment - 08/30/18 1033    Subjective  The tape didn't help this time.  I think it was too low on my wrist.  My arm is still really hurting.    Currently in Pain?  Yes    Pain Score  6     Pain Location  Elbow    Pain Orientation  Right                       [facility] Adult PT Treatment/Exercise - 08/30/18 0001      Shoulder Exercises: Standing   Flexion  AAROM;Right;20 reps   using UE ranger   Other Standing Exercises  cone stack to 1st shelf with pronation x 3 minutes       Shoulder Exercises: ROM/Strengthening   UBE (Upper Arm Bike)  L1 - 3/3 fwd/back      Wrist Exercises   Wrist Extension  --    Bar Weights/Barbell (Wrist Extension)  --    Wrist Radial Deviation  --    Bar Weights/Barbell (Radial Deviation)  --    Other wrist exercises  grip with 3lb -3 minutes    Other wrist exercises  wrist extension/flexion using velcro board (thick strip)x 6 reps      Cryotherapy   Number Minutes Cryotherapy  10 Minutes    Type of Cryotherapy  Ice pack      Manual Therapy   Manual Therapy  Soft tissue mobilization;Myofascial release    Manual therapy comments  Rt forearm and scar mobs               PT Short Term Goals - 08/30/18 1034      PT SHORT TERM GOAL #3   Title  report < or = to 2/10 Rt UE pain with use at home    Baseline  up to 6/10    Period  Weeks    Status  On-going        PT Long Term Goals - 08/08/18 0956      PT LONG TERM GOAL #1   Status  New    Target Date  10/03/18      PT LONG TERM GOAL #2   Title  reduce FOTO to < or = 43% limitation    Baseline  --    Time  8  Period  Weeks    Status  New    Target Date  10/03/18      PT LONG TERM GOAL #3   Title  demonstrate > or = to 55# to hold objects safely    Baseline  --    Time  8    Period  Weeks    Status  New    Target Date  10/03/18      PT LONG TERM GOAL #4   Title  demonstrate > or = to 4/5 Rt elbow strength to improve endurance with use    Time  8    Period  Weeks    Status  New    Target Date  10/03/18      PT LONG TERM GOAL #5   Title  report > or = to 70% use of Rt UE for home, work and self-care tasks    Baseline  --    Time  8    Period  Weeks    Status  New    Target Date  09/26/18            Plan - 08/30/18 1044    Clinical Impression Statement  Pt is frustrated regarding Rt elbow and wrist pain with all use.  Kinesiotape didn't help last session.  Pt reports 3-6/10 Rt arm pain with use at home.  Grip strength is improved since the start of care.  Pt with improved endurance for exercise today in the clinic with reduced fatigue and pain reported with this. PT advised pt to do her best with keeping arm at the side with use and avoid reaching out to reduce strain with use. Pt with good elbow glide with A/AROM at the shoulder.  Pt will call MD today to discuss continued pain.  Pt will continue to benefit from skilled PT for Rt UE strength and flexibility within protocol    PT Frequency  2x / week    PT Duration  8 weeks    PT Treatment/Interventions  ADLs/Self Care Home Management;Cryotherapy;Electrical Stimulation;Moist Heat;Functional mobility training;Therapeutic activities;Therapeutic exercise;Patient/family education;Manual techniques;Vasopneumatic Device;Taping;Passive range of motion;Scar mobilization    PT Next Visit Plan  follow protocol-manual, arm bike, grip, ROM, wrist strength    PT Home Exercise Plan  Access Code: [email or web address]     Consulted and Agree with Plan of Care  Patient       Patient will benefit from skilled therapeutic intervention in order to improve the  following deficits and impairments:  Abnormal gait, Impaired flexibility, Impaired UE functional use, Decreased endurance, Decreased activity tolerance, Decreased range of motion, Decreased strength, Pain  Visit Diagnosis: 1. Stiffness of right elbow, not elsewhere classified   2. Pain in right elbow   3. Muscle weakness (generalized)   4. Localized edema        Problem List Patient Active Problem List   Diagnosis Date Noted  . Radial head fracture, closed 06/16/2018  . Closed fracture dislocation of elbow 06/16/2018  . Other fatigue 07/18/2017  . Shortness of breath on exertion 07/18/2017  . Type 2 diabetes mellitus with retinopathy, with long-term current use of insulin (HCC) 07/18/2017  . Vitamin D deficiency 07/18/2017  . B12 nutritional deficiency 07/18/2017  . Other specified hypothyroidism 07/18/2017  . Acute pain of right knee 12/22/2016  . Trigger index finger of left hand 09/22/2016  . 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) 04/14/2016  . Diabetes (HCC) 01/08/2014  . Essential hypertension 01/08/2014  . Hyperlipidemia 01/08/2014  . Postoperative anemia due to acute blood loss 11/22/2012  . Hyponatremia 11/06/2012  . OA (osteoarthritis) of knee 11/05/2012  . Villonodular synovitis of knee 04/20/2011   Lorrene ReidKelly Jacobus Colvin, PT 08/30/18 11:23 AM  [facility] [address], [address] [place], [place], [address] Phone: [phone]   Fax:  [phone]  Name: [name] MRN: [id number] Date of Birth: [birth year]

## 2018-09-03 ENCOUNTER — Ambulatory Visit: Payer: 59

## 2018-09-03 ENCOUNTER — Other Ambulatory Visit: Payer: Self-pay

## 2018-09-03 DIAGNOSIS — R600 Localized edema: Secondary | ICD-10-CM

## 2018-09-03 DIAGNOSIS — M6281 Muscle weakness (generalized): Secondary | ICD-10-CM

## 2018-09-03 DIAGNOSIS — M25621 Stiffness of right elbow, not elsewhere classified: Secondary | ICD-10-CM

## 2018-09-03 DIAGNOSIS — M25521 Pain in right elbow: Secondary | ICD-10-CM

## 2018-09-03 NOTE — Therapy (Signed)
[facility] [address], [address] [place], [place], [address] Phone: [phone]   Fax:  [phone]  Physical Therapy Treatment  Patient Details  Name: [name] MRN: [id number] Date of Birth: May 03, 1950 Referring Provider (PT): Ramond MarrowVarkey, Dax, MD   Encounter Date: 09/03/2018  PT End of Session - 09/03/18 1614    Visit Number  9    Date for PT Re-Evaluation  10/03/18    Authorization Type  UMR    PT Start Time  1532    PT Stop Time  1626    PT Time Calculation (min)  54 min    Activity Tolerance  Patient tolerated treatment well    Behavior During Therapy  [facility] for tasks assessed/performed       Past Medical History:  Diagnosis Date  . A-fib (HCC)   . Anemia    hx of  . Arthritis   . Chest pain   . Constipation   . Depression   . Diabetes mellitus ORAL MEDS  . Diabetic retinopathy (HCC)   . Dyspnea    with minimal exertion-deconditioned  . Dyspnea   . Dysrhythmia    a-fib  . Food allergy   . GERD (gastroesophageal reflux disease)    occasional  . Gout   . History of kidney stones   . Hypercholesteremia   . Hyperlipidemia   . Hypertension   . Hypertensive kidney disease   . Hypothyroidism   . Knee pain, right   . 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 Obesity   . OSA on CPAP    uses CPAP nightly  . Osteoarthritis   . Palpitations   . Pinched nerve in neck   . Pleurisy   . Pneumonia    hx of  . PONV (postoperative nausea and vomiting)    for 3-4 days after general anesthesia  . Sciatica   . Swelling of knee joint, right   . Synovitis of knee RIGHT  . Trigger finger, left    left index  . Vitamin D deficiency     Past Surgical History:  Procedure Laterality Date  . CESAREAN SECTION  X3  . KNEE ARTHROSCOPY  04/20/2011   Procedure: ARTHROSCOPY KNEE;  Surgeon: Loanne DrillingFrank V Aluisio, MD;  [facility] [place];  Service: Orthopedics;  Laterality: Right;  WITH SYNOVECTOMY   . LEFT CARPAL TUNNEL / LEFT MIDDLE & RING FINGER TRIGGER RELEASE  08-26-2008  . LEFT SHOULDER ARTHROSCOPY W/ DEBRIDEMENT  09-09-2003  . LEFT SHOULDER ARTHROSCOPY/ LEFT THUMB TRIGGER RELEASE  02-22-2005  . PHOTOCOAGULATION WITH LASER Right 03/20/2018   Procedure: PHOTOCOAGULATION WITH LASER;  Surgeon: Carmela RimaPatel, Narendra, MD;  [facility];  Service: Ophthalmology;  Laterality: Right;  . PULLEY RELEASE LEFT LONG FINGER  07-14-2009  . RADIAL HEAD ARTHROPLASTY Right 06/17/2018   Procedure: RADIAL HEAD ARTHROPLASTY;  Surgeon: Bjorn PippinVarkey, Dax T, MD;  [facility];  Service: Orthopedics;  Laterality: Right;  . RIGHT CARPAL TUNNEL/ RIGHT THUMB TRIGGER RELEASE'S  11-28-2006  . RIGHT SHOULDER ARTHROSCOPY W/ ROTATOR CUFF REPAIR  01-13-2004  . SHOULDER ARTHROSCOPY DISTAL CLAVICLE EXCISION AND OPEN ROTATOR CUFF REPAIR  09-07-2004   LEFT  . SHOULDER ARTHROSCOPY W/ ACROMIAL REPAIR  11-29-2005   LEFT  . TONSILLECTOMY AND ADENOIDECTOMY  child  . TOTAL KNEE ARTHROPLASTY  12-14-2009   RIGHT  . TOTAL KNEE ARTHROPLASTY Left 11/05/2012   Procedure: LEFT TOTAL KNEE ARTHROPLASTY;  Surgeon: Loanne DrillingFrank V Aluisio, MD;  [facility];  Service: Orthopedics;  Laterality: Left;  . TOTAL KNEE REVISION Right 07/29/2016   Procedure: RIGHT KNEE POLY-LINER EXCHANGE;  Surgeon: Kathryne HitchBlackman, Christopher Y, MD;  [facility]: [facility];  Service: Orthopedics;  Laterality: Right;  . TRIGGER FINGER RELEASE Right 02/21/2013   Procedure: RIGHT RING A-1 PULLEY RELEASE    (MINOR PROCEDURE) ;  Surgeon: Wyn Forsterobert V Sypher Jr., MD;  [facility] [place];  Service: Orthopedics;  Laterality: Right;  . TRIGGER FINGER RELEASE Left 09/22/2016   Procedure: RELEASE TRIGGER FINGER LEFT INDEX FINGER;  Surgeon: Kathryne HitchBlackman, Christopher Y, MD;  [facility];  Service: Orthopedics;  Laterality: Left;  . TRIGGER FINGER RELEASE Right 01/20/2017   Procedure: RELEASE TRIGGER FINGER/A-1 PULLEY RIGHT INDEX FINGER;  Surgeon: Marcene Corningalldorf, Peter, MD;  [facility]  [place];  Service: Orthopedics;  Laterality: Right;  . VITRECTOMY 25 GAUGE WITH SCLERAL BUCKLE Right 03/20/2018   Procedure: RIGHT EYE VITRECTOMY WITH  ENDOLASER PARENTAL PHOTOCOAGULATION 25 GAUGE;  Surgeon: Carmela RimaPatel, Narendra, MD;  [facility];  Service: Ophthalmology;  Laterality: Right;    There were no vitals filed for this visit.  Subjective Assessment - 09/03/18 1530    Subjective  I worked today and I am miserable.  I called the MD last week and he felt that my wrist pain was normal and he will do an x-ray at the next vist.    Currently in Pain?  Yes    Pain Score  5     Pain Location  Elbow    Pain Orientation  Right    Pain Descriptors / Indicators  Aching;Tightness    Pain Radiating Towards  wrist: up to 8/10 at work  (sharp)    Pain Onset  More than a month ago    Pain Frequency  Constant    Aggravating Factors   writing, use of Rt arm, work tasks    Pain Relieving Factors  rest, ice, keeping arm close to the side                       [facility] Adult PT Treatment/Exercise - 09/03/18 0001      Shoulder Exercises: Standing   Flexion  AAROM;Right;20 reps   using UE ranger     Shoulder Exercises: ROM/Strengthening   UBE (Upper Arm Bike)  L1 - 3/3 fwd/back      Wrist Exercises   Wrist Extension  Strengthening;Right;20 reps;Seated    Bar Weights/Barbell (Wrist Extension)  1 lb    Wrist Radial Deviation  Strengthening;Right;20 reps    Bar Weights/Barbell (Radial Deviation)  1 lb    Other wrist exercises  grip with 3lb -3 minutes      Cryotherapy   Number Minutes Cryotherapy  10 Minutes    Type of Cryotherapy  Ice pack      Manual Therapy   Manual Therapy  Soft tissue mobilization;Myofascial release    Manual therapy comments  Rt forearm and scar mobs    Joint Mobilization  grade 2 wrist mobs                PT Short Term Goals - 08/30/18 1034      PT SHORT TERM GOAL #3   Title  report < or = to 2/10 Rt UE pain with use at home     Baseline  up to 6/10    Period  Weeks    Status  On-going        PT Long Term  Goals - 08/08/18 0956      PT LONG TERM GOAL #1   Status  New    Target Date  10/03/18      PT LONG TERM GOAL #2   Title  reduce FOTO to < or = 43% limitation    Baseline  --    Time  8    Period  Weeks    Status  New    Target Date  10/03/18      PT LONG TERM GOAL #3   Title  demonstrate > or = to 55# to hold objects safely    Baseline  --    Time  8    Period  Weeks    Status  New    Target Date  10/03/18      PT LONG TERM GOAL #4   Title  demonstrate > or = to 4/5 Rt elbow strength to improve endurance with use    Time  8    Period  Weeks    Status  New    Target Date  10/03/18      PT LONG TERM GOAL #5   Title  report > or = to 70% use of Rt UE for home, work and self-care tasks    Baseline  --    Time  8    Period  Weeks    Status  New    Target Date  09/26/18            Plan - 09/03/18 1545    Clinical Impression Statement  Pt returned to work today and came to PT after work.  Pt with significant increase in Rt wrist pain up to 8/10 after writing and typing all day.  Pt contacted MD last week to discuss Rt wrist pain that is limiting use of Rt UE.  Pt tolerated moderate exercise with the Rt UE today and additional exercise was not performed due to pain.  Pt responded well to manual therapy and demonstrates trigger points to Rt wrist extensor tendons and tenderness along the ulnar side of wrist and soft tissue surrounding olecranon.   Pt will continue to benefit from skilled PT for progression of Rt UE strength s/p surgery.    PT Frequency  2x / week    PT Duration  8 weeks    PT Treatment/Interventions  ADLs/Self Care Home Management;Cryotherapy;Electrical Stimulation;Moist Heat;Functional mobility training;Therapeutic activities;Therapeutic exercise;Patient/family education;Manual techniques;Vasopneumatic Device;Taping;Passive range of motion;Scar mobilization    PT Next Visit  Plan  follow protocol-manual, arm bike, grip, ROM, wrist strength.  Increase exercise as tolerated.    PT Home Exercise Plan  Access Code: [email or web address]     Consulted and Agree with Plan of Care  Patient       Patient will benefit from skilled therapeutic intervention in order to improve the following deficits and impairments:  Abnormal gait, Impaired flexibility, Impaired UE functional use, Decreased endurance, Decreased activity tolerance, Decreased range of motion, Decreased strength, Pain  Visit Diagnosis: 1. Stiffness of right elbow, not elsewhere classified   2. Pain in right elbow   3. Muscle weakness (generalized)   4. Localized edema        Problem List Patient Active Problem List   Diagnosis Date Noted  . Radial head fracture, closed 06/16/2018  . Closed fracture dislocation of elbow 06/16/2018  . Other fatigue 07/18/2017  . Shortness of breath on exertion 07/18/2017  . Type 2 diabetes mellitus with retinopathy, with long-term current use of  insulin (HCC) 07/18/2017  . Vitamin D deficiency 07/18/2017  . B12 nutritional deficiency 07/18/2017  . Other specified hypothyroidism 07/18/2017  . Acute pain of right knee 12/22/2016  . Trigger index finger of left hand 09/22/2016  . 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 Diabetes (HCC) 01/08/2014  . Essential hypertension 01/08/2014  . Hyperlipidemia 01/08/2014  . Postoperative anemia due to acute blood loss 11/22/2012  . Hyponatremia 11/06/2012  . OA (osteoarthritis) of knee 11/05/2012  . Villonodular synovitis of knee 04/20/2011   Lorrene ReidKelly Bertice Risse, PT 09/03/18 4:17 PM   [facility] [address], [address] [place], [place], [address] Phone: [phone]   Fax:  [phone]  Name: [name] MRN: [id number] Date of  Birth: 1950/07/08

## 2018-09-04 MED FILL — TRULICITY 1.5 MG/0.5 ML PEN: 1.5 | 28 days supply | Qty: 2 | Fill #3

## 2018-09-06 ENCOUNTER — Other Ambulatory Visit: Payer: Self-pay

## 2018-09-06 ENCOUNTER — Ambulatory Visit: Payer: 59 | Admitting: Physical Therapy

## 2018-09-06 ENCOUNTER — Encounter: Payer: Self-pay | Admitting: Physical Therapy

## 2018-09-06 DIAGNOSIS — M25521 Pain in right elbow: Secondary | ICD-10-CM

## 2018-09-06 DIAGNOSIS — R600 Localized edema: Secondary | ICD-10-CM

## 2018-09-06 DIAGNOSIS — M6281 Muscle weakness (generalized): Secondary | ICD-10-CM

## 2018-09-06 DIAGNOSIS — M25621 Stiffness of right elbow, not elsewhere classified: Secondary | ICD-10-CM | POA: Diagnosis not present

## 2018-09-06 NOTE — Therapy (Signed)
[facility] [address], [address] [place], [place], [address] Phone: [phone]   Fax:  [phone]  Physical Therapy Treatment  Patient Details  Name: [name] MRN: [id number] Date of Birth: [birth year] Referring Provider (PT): Ramond MarrowVarkey, Dax, MD   Encounter Date: 09/06/2018  PT End of Session - 09/06/18 1615    Visit Number  10    Date for PT Re-Evaluation  10/03/18    Authorization Type  UMR    PT Start Time  1522    PT Stop Time  1620   ice   PT Time Calculation (min)  58 min    Activity Tolerance  Patient tolerated treatment well       Past Medical History:  Diagnosis Date  . A-fib (HCC)   . Anemia    hx of  . Arthritis   . Chest pain   . Constipation   . Depression   . Diabetes mellitus ORAL MEDS  . Diabetic retinopathy (HCC)   . Dyspnea    with minimal exertion-deconditioned  . Dyspnea   . Dysrhythmia    a-fib  . Food allergy   . GERD (gastroesophageal reflux disease)    occasional  . Gout   . History of kidney stones   . Hypercholesteremia   . Hyperlipidemia   . Hypertension   . Hypertensive kidney disease   . Hypothyroidism   . Knee pain, right   . 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 Obesity   . OSA on CPAP    uses CPAP nightly  . Osteoarthritis   . Palpitations   . Pinched nerve in neck   . Pleurisy   . Pneumonia    hx of  . PONV (postoperative nausea and vomiting)    for 3-4 days after general anesthesia  . Sciatica   . Swelling of knee joint, right   . Synovitis of knee RIGHT  . Trigger finger, left    left index  . Vitamin D deficiency     Past Surgical History:  Procedure Laterality Date  . CESAREAN SECTION  X3  . KNEE ARTHROSCOPY  04/20/2011   Procedure: ARTHROSCOPY KNEE;  Surgeon: Loanne DrillingFrank V Aluisio, MD;  [facility] [place];  Service: Orthopedics;  Laterality: Right;  WITH SYNOVECTOMY  . LEFT CARPAL TUNNEL / LEFT MIDDLE & RING FINGER  TRIGGER RELEASE  08-26-2008  . LEFT SHOULDER ARTHROSCOPY W/ DEBRIDEMENT  09-09-2003  . LEFT SHOULDER ARTHROSCOPY/ LEFT THUMB TRIGGER RELEASE  02-22-2005  . PHOTOCOAGULATION WITH LASER Right 03/20/2018   Procedure: PHOTOCOAGULATION WITH LASER;  Surgeon: Carmela RimaPatel, Narendra, MD;  [facility];  Service: Ophthalmology;  Laterality: Right;  . PULLEY RELEASE LEFT LONG FINGER  07-14-2009  . RADIAL HEAD ARTHROPLASTY Right 06/17/2018   Procedure: RADIAL HEAD ARTHROPLASTY;  Surgeon: Bjorn PippinVarkey, Dax T, MD;  [facility];  Service: Orthopedics;  Laterality: Right;  . RIGHT CARPAL TUNNEL/ RIGHT THUMB TRIGGER RELEASE'S  11-28-2006  . RIGHT SHOULDER ARTHROSCOPY W/ ROTATOR CUFF REPAIR  01-13-2004  . SHOULDER ARTHROSCOPY DISTAL CLAVICLE EXCISION AND OPEN ROTATOR CUFF REPAIR  09-07-2004   LEFT  . SHOULDER ARTHROSCOPY W/ ACROMIAL REPAIR  11-29-2005   LEFT  . TONSILLECTOMY AND ADENOIDECTOMY  child  . TOTAL KNEE ARTHROPLASTY  12-14-2009   RIGHT  . TOTAL KNEE ARTHROPLASTY Left 11/05/2012   Procedure: LEFT TOTAL KNEE ARTHROPLASTY;  Surgeon: Loanne DrillingFrank V Aluisio, MD;  [facility];  Service: Orthopedics;  Laterality: Left;  .  TOTAL KNEE REVISION Right 07/29/2016   Procedure: RIGHT KNEE POLY-LINER EXCHANGE;  Surgeon: Kathryne HitchBlackman, Christopher Y, MD;  [facility];  Service: Orthopedics;  Laterality: Right;  . TRIGGER FINGER RELEASE Right 02/21/2013   Procedure: RIGHT RING A-1 PULLEY RELEASE    (MINOR PROCEDURE) ;  Surgeon: Wyn Forsterobert V Sypher Jr., MD;  [facility] [place];  Service: Orthopedics;  Laterality: Right;  . TRIGGER FINGER RELEASE Left 09/22/2016   Procedure: RELEASE TRIGGER FINGER LEFT INDEX FINGER;  Surgeon: Kathryne HitchBlackman, Christopher Y, MD;  [facility];  Service: Orthopedics;  Laterality: Left;  . TRIGGER FINGER RELEASE Right 01/20/2017   Procedure: RELEASE TRIGGER FINGER/A-1 PULLEY RIGHT INDEX FINGER;  Surgeon: Marcene Corningalldorf, Peter, MD;  Location: [facility];  Service: Orthopedics;   Laterality: Right;  . VITRECTOMY 25 GAUGE WITH SCLERAL BUCKLE Right 03/20/2018   Procedure: RIGHT EYE VITRECTOMY WITH  ENDOLASER PARENTAL PHOTOCOAGULATION 25 GAUGE;  Surgeon: Carmela RimaPatel, Narendra, MD;  [facility];  Service: Ophthalmology;  Laterality: Right;    There were no vitals filed for this visit.  Subjective Assessment - 09/06/18 1525    Subjective  My back is bothering me but I'm not ready for an injection yet.  Trying to remember not to use right arm for wiping down desk at work.    Pertinent History  elbow surgery: coronoid ORIF/radial head arhtroplasty/lateral ligament repair on 06/16/18  avoid elbow varus, avoid shoulder abduction in upright position    Currently in Pain?  Yes    Pain Score  7     Pain Location  Elbow    Pain Orientation  Right    Pain Type  Surgical pain    Aggravating Factors   work tasks                       [facility] Adult PT Treatment/Exercise - 09/06/18 0001      Shoulder Exercises: ROM/Strengthening   UBE (Upper Arm Bike)  L1 - 3/3 fwd/back      Wrist Exercises   Wrist Extension  Strengthening;Right;20 reps;Seated    Bar Weights/Barbell (Wrist Extension)  1 lb    Wrist Radial Deviation  Strengthening;Right;20 reps    Bar Weights/Barbell (Radial Deviation)  1 lb    Other wrist exercises  grip with 3lb -3 minutes      Cryotherapy   Number Minutes Cryotherapy  10 Minutes    Cryotherapy Location  Wrist;Forearm    Type of Cryotherapy  Ice pack      Manual Therapy   Manual Therapy  Soft tissue mobilization;Myofascial release    Manual therapy comments  Rt forearm and scar mobs    Soft tissue mobilization  stainless steel instrument assisted soft tissue work                PT Short Term Goals - 08/30/18 1034      PT SHORT TERM GOAL #3   Title  report < or = to 2/10 Rt UE pain with use at home    Baseline  up to 6/10    Period  Weeks    Status  On-going        PT Long Term Goals - 08/08/18 0956      PT LONG TERM GOAL  #1   Status  New    Target Date  10/03/18      PT LONG TERM GOAL #2   Title  reduce FOTO to < or = 43% limitation  Baseline  --    Time  8    Period  Weeks    Status  New    Target Date  10/03/18      PT LONG TERM GOAL #3   Title  demonstrate > or = to 55# to hold objects safely    Baseline  --    Time  8    Period  Weeks    Status  New    Target Date  10/03/18      PT LONG TERM GOAL #4   Title  demonstrate > or = to 4/5 Rt elbow strength to improve endurance with use    Time  8    Period  Weeks    Status  New    Target Date  10/03/18      PT LONG TERM GOAL #5   Title  report > or = to 70% use of Rt UE for home, work and self-care tasks    Baseline  --    Time  8    Period  Weeks    Status  New    Target Date  09/26/18            Plan - 09/06/18 1616    Clinical Impression Statement  Treatment continues to be modified secondary to increased wrist pain associated with return to work this week.  She is able to perform low level exercises with increased discomfort relieved with ice pack.  Tender points in wrist extensors and triceps regions.  Therapist closely monitoring response and modifying as needed.    Rehab Potential  Excellent    PT Frequency  2x / week    PT Duration  8 weeks    PT Treatment/Interventions  ADLs/Self Care Home Management;Cryotherapy;Electrical Stimulation;Moist Heat;Functional mobility training;Therapeutic activities;Therapeutic exercise;Patient/family education;Manual techniques;Vasopneumatic Device;Taping;Passive range of motion;Scar mobilization    PT Next Visit Plan  follow protocol-manual, arm bike, grip, ROM, wrist strength.  Increase exercise as tolerated.    PT Home Exercise Plan  Access Code: [email or web address]        Patient will benefit from skilled therapeutic intervention in order to improve the following deficits and impairments:  Abnormal gait, Impaired flexibility, Impaired UE functional use, Decreased endurance, Decreased activity  tolerance, Decreased range of motion, Decreased strength, Pain  Visit Diagnosis: 1. Stiffness of right elbow, not elsewhere classified   2. Pain in right elbow   3. Muscle weakness (generalized)   4. Localized edema        Problem List Patient Active Problem List   Diagnosis Date Noted  . Radial head fracture, closed 06/16/2018  . Closed fracture dislocation of elbow 06/16/2018  . Other fatigue 07/18/2017  . Shortness of breath on exertion 07/18/2017  . Type 2 diabetes mellitus with retinopathy, with long-term current use of insulin ([place]) 07/18/2017  . Vitamin D deficiency 07/18/2017  . B12 nutritional deficiency 07/18/2017  . Other specified hypothyroidism 07/18/2017  . Acute pain of right knee 12/22/2016  . Trigger index finger of left hand 09/22/2016  . 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 Diabetes ([place]) 01/08/2014  . Essential hypertension 01/08/2014  . Hyperlipidemia 01/08/2014  . Postoperative anemia due to acute blood loss 11/22/2012  . Hyponatremia 11/06/2012  . OA (osteoarthritis) of knee 11/05/2012  . Villonodular synovitis of knee 04/20/2011   Ruben Im, PT 09/06/18 4:25 PM Phone: [phone] Fax:  [phone] Vivien PrestoSimpson, Toshiye Kever C 09/06/2018, 4:25 PM  [facility] [address], [address] [place], [place], [address] Phone: [phone]   Fax:  [phone]  Name: [name] MRN: [id number] Date of Birth: November 25, 1950

## 2018-09-10 DIAGNOSIS — Z5181 Encounter for therapeutic drug level monitoring: Secondary | ICD-10-CM | POA: Diagnosis not present

## 2018-09-10 DIAGNOSIS — Z794 Long term (current) use of insulin: Secondary | ICD-10-CM | POA: Diagnosis not present

## 2018-09-10 DIAGNOSIS — E039 Hypothyroidism, unspecified: Secondary | ICD-10-CM | POA: Diagnosis not present

## 2018-09-10 DIAGNOSIS — Z8639 Personal history of other endocrine, nutritional and metabolic disease: Secondary | ICD-10-CM | POA: Diagnosis not present

## 2018-09-10 DIAGNOSIS — E11319 Type 2 diabetes mellitus with unspecified diabetic retinopathy without macular edema: Secondary | ICD-10-CM | POA: Diagnosis not present

## 2018-09-10 DIAGNOSIS — E1165 Type 2 diabetes mellitus with hyperglycemia: Secondary | ICD-10-CM | POA: Diagnosis not present

## 2018-09-10 MED FILL — hydrALAZINE HCL 25 MG TABS: 25 | 30 days supply | Qty: 60 | Fill #0

## 2018-09-10 MED FILL — LEVOTHYROXINE 125 MCG TABLE: 125 | 30 days supply | Qty: 30 | Fill #3

## 2018-09-10 MED FILL — LOSARTAN POTASSIUM 100 MG T: 100 | 30 days supply | Qty: 30 | Fill #1

## 2018-09-12 DIAGNOSIS — E113511 Type 2 diabetes mellitus with proliferative diabetic retinopathy with macular edema, right eye: Secondary | ICD-10-CM | POA: Diagnosis not present

## 2018-09-13 DIAGNOSIS — M25531 Pain in right wrist: Secondary | ICD-10-CM | POA: Diagnosis not present

## 2018-09-13 DIAGNOSIS — S52121D Displaced fracture of head of right radius, subsequent encounter for closed fracture with routine healing: Secondary | ICD-10-CM | POA: Diagnosis not present

## 2018-09-19 ENCOUNTER — Ambulatory Visit: Payer: 59 | Attending: Orthopaedic Surgery | Admitting: Physical Therapy

## 2018-09-19 ENCOUNTER — Other Ambulatory Visit: Payer: Self-pay

## 2018-09-19 ENCOUNTER — Encounter: Payer: Self-pay | Admitting: Physical Therapy

## 2018-09-19 DIAGNOSIS — M6281 Muscle weakness (generalized): Secondary | ICD-10-CM | POA: Insufficient documentation

## 2018-09-19 DIAGNOSIS — M25621 Stiffness of right elbow, not elsewhere classified: Secondary | ICD-10-CM | POA: Insufficient documentation

## 2018-09-19 DIAGNOSIS — M25521 Pain in right elbow: Secondary | ICD-10-CM | POA: Diagnosis not present

## 2018-09-19 DIAGNOSIS — R600 Localized edema: Secondary | ICD-10-CM | POA: Diagnosis not present

## 2018-09-19 NOTE — Therapy (Signed)
[facility] [address], [place], [place], [address] Phone: [phone]   Fax:  [phone]  Physical Therapy Treatment  Patient Details  Name: [name] MRN: [id number] Date of Birth: Jul 27, 1950 Referring Provider (PT): Ophelia Charter, MD   Encounter Date: 09/19/2018  PT End of Session - 09/19/18 1558    Visit Number  11    Date for PT Re-Evaluation  10/03/18    Authorization Type  UMR    PT Start Time  1555    PT Stop Time  1645    PT Time Calculation (min)  50 min    Activity Tolerance  Patient tolerated treatment well    Behavior During Therapy  [facility] for tasks assessed/performed       Past Medical History:  Diagnosis Date  . A-fib ([place])   . Anemia    hx of  . Arthritis   . Chest pain   . Constipation   . Depression   . Diabetes mellitus ORAL MEDS  . Diabetic retinopathy ([place])   . Dyspnea    with minimal exertion-deconditioned  . Dyspnea   . Dysrhythmia    a-fib  . Food allergy   . GERD (gastroesophageal reflux disease)    occasional  . Gout   . History of kidney stones   . Hypercholesteremia   . Hyperlipidemia   . Hypertension   . Hypertensive kidney disease   . Hypothyroidism   . Knee pain, right   . 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 Obesity   . OSA on CPAP    uses CPAP nightly  . Osteoarthritis   . Palpitations   . Pinched nerve in neck   . Pleurisy   . Pneumonia    hx of  . PONV (postoperative nausea and vomiting)    for 3-4 days after general anesthesia  . Sciatica   . Swelling of knee joint, right   . Synovitis of knee RIGHT  . Trigger finger, left    left index  . Vitamin D deficiency     Past Surgical History:  Procedure Laterality Date  . CESAREAN SECTION  X3  . KNEE ARTHROSCOPY  04/20/2011   Procedure: ARTHROSCOPY KNEE;  Surgeon: Gearlean Alf, MD;  [facility];  Service: Orthopedics;  Laterality: Right;  WITH SYNOVECTOMY   . LEFT CARPAL TUNNEL / LEFT MIDDLE & RING FINGER TRIGGER RELEASE  08-26-2008  . LEFT SHOULDER ARTHROSCOPY W/ DEBRIDEMENT  09-09-2003  . LEFT SHOULDER ARTHROSCOPY/ LEFT THUMB TRIGGER RELEASE  02-22-2005  . PHOTOCOAGULATION WITH LASER Right 03/20/2018   Procedure: PHOTOCOAGULATION WITH LASER;  Surgeon: Jalene Mullet, MD;  Location: [place];  Service: Ophthalmology;  Laterality: Right;  . PULLEY RELEASE LEFT LONG FINGER  07-14-2009  . RADIAL HEAD ARTHROPLASTY Right 06/17/2018   Procedure: RADIAL HEAD ARTHROPLASTY;  Surgeon: Hiram Gash, MD;  Location: [place];  Service: Orthopedics;  Laterality: Right;  . RIGHT CARPAL TUNNEL/ RIGHT THUMB TRIGGER RELEASE'S  11-28-2006  . RIGHT SHOULDER ARTHROSCOPY W/ ROTATOR CUFF REPAIR  01-13-2004  . SHOULDER ARTHROSCOPY DISTAL CLAVICLE EXCISION AND OPEN ROTATOR CUFF REPAIR  09-07-2004   LEFT  . SHOULDER ARTHROSCOPY W/ ACROMIAL REPAIR  11-29-2005   LEFT  . TONSILLECTOMY AND ADENOIDECTOMY  child  . TOTAL KNEE ARTHROPLASTY  12-14-2009   RIGHT  . TOTAL KNEE ARTHROPLASTY Left 11/05/2012   Procedure: LEFT TOTAL KNEE ARTHROPLASTY;  Surgeon: Gearlean Alf, MD;  [facility];  Service: Orthopedics;  Laterality: Left;  . TOTAL KNEE REVISION Right 07/29/2016   Procedure: RIGHT KNEE POLY-LINER EXCHANGE;  Surgeon: Kathryne HitchBlackman, Christopher Y, MD;  [facility];  Service: Orthopedics;  Laterality: Right;  . TRIGGER FINGER RELEASE Right 02/21/2013   Procedure: RIGHT RING A-1 PULLEY RELEASE    (MINOR PROCEDURE) ;  Surgeon: Wyn Forsterobert V Sypher Jr., MD;  [facility];  Service: Orthopedics;  Laterality: Right;  . TRIGGER FINGER RELEASE Left 09/22/2016   Procedure: RELEASE TRIGGER FINGER LEFT INDEX FINGER;  Surgeon: Kathryne HitchBlackman, Christopher Y, MD;  [facility];  Service: Orthopedics;  Laterality: Left;  . TRIGGER FINGER RELEASE Right 01/20/2017   Procedure: RELEASE TRIGGER FINGER/A-1 PULLEY RIGHT INDEX FINGER;  Surgeon: Marcene Corningalldorf, Peter, MD;  [facility]  [place];  Service: Orthopedics;  Laterality: Right;  . VITRECTOMY 25 GAUGE WITH SCLERAL BUCKLE Right 03/20/2018   Procedure: RIGHT EYE VITRECTOMY WITH  ENDOLASER PARENTAL PHOTOCOAGULATION 25 GAUGE;  Surgeon: Carmela RimaPatel, Narendra, MD;  [facility];  Service: Ophthalmology;  Laterality: Right;    There were no vitals filed for this visit.  Subjective Assessment - 09/19/18 1559    Subjective  I am hurting today, no idea why. Did fine after last session. I saw my MD and he released me. Ok to finish PT then DC. I am allowed to lift up to 10# but I am not mentally not ready.    Pertinent History  elbow surgery: coronoid ORIF/radial head arhtroplasty/lateral ligament repair on 06/16/18  avoid elbow varus, avoid shoulder abduction in upright position    Currently in Pain?  Yes    Pain Score  4     Pain Location  Elbow    Pain Orientation  Right    Pain Descriptors / Indicators  Discomfort;Aching    Aggravating Factors   work tasks    Pain Relieving Factors  rest, ice, soft tissue work    Multiple Pain Sites  No                       [facility] Adult PT Treatment/Exercise - 09/19/18 0001      Shoulder Exercises: ROM/Strengthening   UBE (Upper Arm Bike)  L1 - 3/3 fwd/back   PTA present to discuss MD appt     Wrist Exercises   Wrist Extension  Strengthening;Right;20 reps;Seated    Bar Weights/Barbell (Wrist Extension)  2 lbs    Wrist Radial Deviation  Strengthening;Right;20 reps    Bar Weights/Barbell (Radial Deviation)  2 lbs    Other wrist exercises  grip with 3lb -3 minutes    Other wrist exercises  Wrist extensor stretch 3x 30 sec      Cryotherapy   Number Minutes Cryotherapy  10 Minutes    Cryotherapy Location  --   elbow & forearm   Type of Cryotherapy  Ice pack      Manual Therapy   Manual Therapy  Soft tissue mobilization;Myofascial release    Manual therapy comments  Rt forearm and scar mobs               PT Short Term Goals - 08/30/18 1034       PT SHORT TERM GOAL #3   Title  report < or = to 2/10 Rt UE pain with use at home    Baseline  up to 6/10    Period  Weeks    Status  On-going  PT Long Term Goals - 08/08/18 0956      PT LONG TERM GOAL #1   Status  New    Target Date  10/03/18      PT LONG TERM GOAL #2   Title  reduce FOTO to < or = 43% limitation    Baseline  --    Time  8    Period  Weeks    Status  New    Target Date  10/03/18      PT LONG TERM GOAL #3   Title  demonstrate > or = to 55# to hold objects safely    Baseline  --    Time  8    Period  Weeks    Status  New    Target Date  10/03/18      PT LONG TERM GOAL #4   Title  demonstrate > or = to 4/5 Rt elbow strength to improve endurance with use    Time  8    Period  Weeks    Status  New    Target Date  10/03/18      PT LONG TERM GOAL #5   Title  report > or = to 70% use of Rt UE for home, work and self-care tasks    Baseline  --    Time  8    Period  Weeks    Status  New    Target Date  09/26/18            Plan - 09/19/18 1559    Clinical Impression Statement  Pt arrives today with self described " a lot of pain" but rating it as only 4/10. Pt was able to increase her wrist strength resistance without pain.wrist extensor mass remains thick. Soft tissue work helps increase the tissue mobility.    Personal Factors and Comorbidities  Age;Comorbidity 1    Comorbidities  obesity    Examination-Activity Limitations  Bathing;Hygiene/Grooming;Carry    Examination-Participation Restrictions  Cleaning;Shop;Meal Prep    Stability/Clinical Decision Making  Stable/Uncomplicated    Rehab Potential  Excellent    PT Frequency  2x / week    PT Duration  8 weeks    PT Treatment/Interventions  ADLs/Self Care Home Management;Cryotherapy;Electrical Stimulation;Moist Heat;Functional mobility training;Therapeutic activities;Therapeutic exercise;Patient/family education;Manual techniques;Vasopneumatic Device;Taping;Passive range of motion;Scar  mobilization    PT Next Visit Plan  3 more visits then DC, look at shoulder strengthand see if HEP covers this.    PT Home Exercise Plan  Access Code: [email or web address]     Consulted and Agree with Plan of Care  Patient       Patient will benefit from skilled therapeutic intervention in order to improve the following deficits and impairments:  Abnormal gait, Impaired flexibility, Impaired UE functional use, Decreased endurance, Decreased activity tolerance, Decreased range of motion, Decreased strength, Pain  Visit Diagnosis: 1. Stiffness of right elbow, not elsewhere classified   2. Pain in right elbow   3. Muscle weakness (generalized)   4. Localized edema        Problem List Patient Active Problem List   Diagnosis Date Noted  . Radial head fracture, closed 06/16/2018  . Closed fracture dislocation of elbow 06/16/2018  . Other fatigue 07/18/2017  . Shortness of breath on exertion 07/18/2017  . Type 2 diabetes mellitus with retinopathy, with long-term current use of insulin (HCC) 07/18/2017  . Vitamin D deficiency 07/18/2017  . B12 nutritional deficiency 07/18/2017  . Other specified hypothyroidism 07/18/2017  .  Acute pain of right knee 12/22/2016  . Trigger index finger of left hand 09/22/2016  . 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 Diabetes (HCC) 01/08/2014  . Essential hypertension 01/08/2014  . Hyperlipidemia 01/08/2014  . Postoperative anemia due to acute blood loss 11/22/2012  . Hyponatremia 11/06/2012  . OA (osteoarthritis) of knee 11/05/2012  . Villonodular synovitis of knee 04/20/2011    Janazia Schreier, PTA 09/19/2018, 4:48 PM  [facility] [address], [address] [place], [place], [address] Phone: [phone]   Fax:  [phone]  Name: [name] MRN:  [id number] Date of Birth: [birth year]

## 2018-09-21 ENCOUNTER — Other Ambulatory Visit: Payer: Self-pay

## 2018-09-21 ENCOUNTER — Ambulatory Visit: Payer: 59 | Admitting: Physical Therapy

## 2018-09-21 ENCOUNTER — Encounter: Payer: Self-pay | Admitting: Physical Therapy

## 2018-09-21 DIAGNOSIS — M6281 Muscle weakness (generalized): Secondary | ICD-10-CM

## 2018-09-21 DIAGNOSIS — M25621 Stiffness of right elbow, not elsewhere classified: Secondary | ICD-10-CM | POA: Diagnosis not present

## 2018-09-21 DIAGNOSIS — R600 Localized edema: Secondary | ICD-10-CM

## 2018-09-21 DIAGNOSIS — M25521 Pain in right elbow: Secondary | ICD-10-CM

## 2018-09-21 DIAGNOSIS — G4733 Obstructive sleep apnea (adult) (pediatric): Secondary | ICD-10-CM | POA: Diagnosis not present

## 2018-09-21 NOTE — Therapy (Signed)
[facility] [address], [address] [place], [place], [address] Phone: [phone]   Fax:  [phone]  Physical Therapy Treatment  Patient Details  Name: Stefanie DeemBonnie D Doke MRN: [id number] Date of Birth: 09-08-1950 Referring Provider (PT): Ramond MarrowVarkey, Dax, MD   Encounter Date: 09/21/2018  PT End of Session - 09/21/18 1630    Visit Number  12    Date for PT Re-Evaluation  10/03/18    Authorization Type  UMR    PT Start Time  1530    PT Stop Time  1629    PT Time Calculation (min)  59 min    Activity Tolerance  Patient tolerated treatment well;No increased pain    Behavior During Therapy  WFL for tasks assessed/performed       Past Medical History:  Diagnosis Date  . A-fib ([facility])   . Anemia    hx of  . Arthritis   . Chest pain   . Constipation   . Depression   . Diabetes mellitus ORAL MEDS  . Diabetic retinopathy ([facility])   . Dyspnea    with minimal exertion-deconditioned  . Dyspnea   . Dysrhythmia    a-fib  . Food allergy   . GERD (gastroesophageal reflux disease)    occasional  . Gout   . History of kidney stones   . Hypercholesteremia   . Hyperlipidemia   . Hypertension   . Hypertensive kidney disease   . Hypothyroidism   . Knee pain, right   . 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 Obesity   . OSA on CPAP    uses CPAP nightly  . Osteoarthritis   . Palpitations   . Pinched nerve in neck   . Pleurisy   . Pneumonia    hx of  . PONV (postoperative nausea and vomiting)    for 3-4 days after general anesthesia  . Sciatica   . Swelling of knee joint, right   . Synovitis of knee RIGHT  . Trigger finger, left    left index  . Vitamin D deficiency     Past Surgical History:  Procedure Laterality Date  . CESAREAN SECTION  X3  . KNEE ARTHROSCOPY  04/20/2011   Procedure: ARTHROSCOPY KNEE;  Surgeon: Loanne DrillingFrank V Aluisio, MD;  [facility];  Service: Orthopedics;  Laterality: Right;   WITH SYNOVECTOMY  . LEFT CARPAL TUNNEL / LEFT MIDDLE & RING FINGER TRIGGER RELEASE  08-26-2008  . LEFT SHOULDER ARTHROSCOPY W/ DEBRIDEMENT  09-09-2003  . LEFT SHOULDER ARTHROSCOPY/ LEFT THUMB TRIGGER RELEASE  02-22-2005  . PHOTOCOAGULATION WITH LASER Right 03/20/2018   Procedure: PHOTOCOAGULATION WITH LASER;  Surgeon: Carmela RimaPatel, Narendra, MD;  [facility];  Service: Ophthalmology;  Laterality: Right;  . PULLEY RELEASE LEFT LONG FINGER  07-14-2009  . RADIAL HEAD ARTHROPLASTY Right 06/17/2018   Procedure: RADIAL HEAD ARTHROPLASTY;  Surgeon: Bjorn PippinVarkey, Dax T, MD;  [facility];  Service: Orthopedics;  Laterality: Right;  . RIGHT CARPAL TUNNEL/ RIGHT THUMB TRIGGER RELEASE'S  11-28-2006  . RIGHT SHOULDER ARTHROSCOPY W/ ROTATOR CUFF REPAIR  01-13-2004  . SHOULDER ARTHROSCOPY DISTAL CLAVICLE EXCISION AND OPEN ROTATOR CUFF REPAIR  09-07-2004   LEFT  . SHOULDER ARTHROSCOPY W/ ACROMIAL REPAIR  11-29-2005   LEFT  . TONSILLECTOMY AND ADENOIDECTOMY  child  . TOTAL KNEE ARTHROPLASTY  12-14-2009   RIGHT  . TOTAL KNEE ARTHROPLASTY Left 11/05/2012   Procedure: LEFT TOTAL KNEE ARTHROPLASTY;  Surgeon: Loanne DrillingFrank V Aluisio, MD;  [facility];  Service: Orthopedics;  Laterality: Left;  . TOTAL KNEE REVISION Right 07/29/2016   Procedure: RIGHT KNEE POLY-LINER EXCHANGE;  Surgeon: Mcarthur Rossetti, MD;  [facility];  Service: Orthopedics;  Laterality: Right;  . TRIGGER FINGER RELEASE Right 02/21/2013   Procedure: RIGHT RING A-1 PULLEY RELEASE    (MINOR PROCEDURE) ;  Surgeon: Cammie Sickle., MD;  [facility];  Service: Orthopedics;  Laterality: Right;  . TRIGGER FINGER RELEASE Left 09/22/2016   Procedure: RELEASE TRIGGER FINGER LEFT INDEX FINGER;  Surgeon: Mcarthur Rossetti, MD;  Location: [place];  Service: Orthopedics;  Laterality: Left;  . TRIGGER FINGER RELEASE Right 01/20/2017   Procedure: RELEASE TRIGGER FINGER/A-1 PULLEY RIGHT INDEX FINGER;  Surgeon: Melrose Nakayama, MD;   Location: [place];  Service: Orthopedics;  Laterality: Right;  . VITRECTOMY 25 GAUGE WITH SCLERAL BUCKLE Right 03/20/2018   Procedure: RIGHT EYE VITRECTOMY WITH  ENDOLASER PARENTAL PHOTOCOAGULATION 25 GAUGE;  Surgeon: Jalene Mullet, MD;  Location: [place];  Service: Ophthalmology;  Laterality: Right;    There were no vitals filed for this visit.  Subjective Assessment - 09/21/18 1535    Subjective  Pt reports that her elbow and wrist are hurting her currently.    Pertinent History  elbow surgery: coronoid ORIF/radial head arhtroplasty/lateral ligament repair on 06/16/18  avoid elbow varus, avoid shoulder abduction in upright position    Currently in Pain?  Yes    Pain Score  3    lateral elbow 3, medial wrist 5   Pain Orientation  Right                       [facility] Adult PT Treatment/Exercise - 09/21/18 0001      Shoulder Exercises: ROM/Strengthening   Other ROM/Strengthening Exercises  Rt tricep extension with yellow TB x12 reps       Shoulder Exercises: Stretch   Other Shoulder Stretches  Rt wrist extensor stretch x20 sec    Other Shoulder Stretches  rolling Rt wrist extensor group      Wrist Exercises   Other wrist exercises  Rt wrist pronation/supination 1# 2x10 reps     Other wrist exercises  Rt bicep curl #3 x15 reps       Cryotherapy   Number Minutes Cryotherapy  10 Minutes   PT present to discuss HEP updates and implications    Cryotherapy Location  Wrist;Forearm    Type of Cryotherapy  Ice pack      Manual Therapy   Joint Mobilization  Rt proximal and distal radial mobilization to improve supination    Soft tissue mobilization  STM wrist extensors, trigger point release Rt adductor pollicis             PT Education - 09/21/18 1638    Education Details  updated HEP    Person(s) Educated  Patient    Methods  Explanation    Comprehension  Verbalized understanding       PT Short Term Goals - 08/30/18 1034      PT SHORT TERM  GOAL #3   Title  report < or = to 2/10 Rt UE pain with use at home    Baseline  up to 6/10    Period  Weeks    Status  On-going        PT Long Term Goals - 08/08/18 0956      PT LONG TERM GOAL #1  Status  New    Target Date  10/03/18      PT LONG TERM GOAL #2   Title  reduce FOTO to < or = 43% limitation    Baseline  --    Time  8    Period  Weeks    Status  New    Target Date  10/03/18      PT LONG TERM GOAL #3   Title  demonstrate > or = to 55# to hold objects safely    Baseline  --    Time  8    Period  Weeks    Status  New    Target Date  10/03/18      PT LONG TERM GOAL #4   Title  demonstrate > or = to 4/5 Rt elbow strength to improve endurance with use    Time  8    Period  Weeks    Status  New    Target Date  10/03/18      PT LONG TERM GOAL #5   Title  report > or = to 70% use of Rt UE for home, work and self-care tasks    Baseline  --    Time  8    Period  Weeks    Status  New    Target Date  09/26/18            Plan - 09/21/18 1631    Clinical Impression Statement  Pt continues to note wrist and elbow pain throughout the day. She demonstrates limitations in active and passive wrist supination, so this was addressed with gentle mobilizations. Pt did note decreased forearm pain following this. Also made several updates to pt's HEP to target bicep/tricep and wrist supination/pronation strength. Pt denied increase in pain following today's exercises. She would continue to benefit from skilled PT to address limitations in forearm/elbow ROM, and progress confidence and strength with RUE activity.    Personal Factors and Comorbidities  Age;Comorbidity 1    Comorbidities  obesity    Examination-Activity Limitations  Bathing;Hygiene/Grooming;Carry    Examination-Participation Restrictions  Cleaning;Shop;Meal Prep    Stability/Clinical Decision Making  Stable/Uncomplicated    Rehab Potential  Excellent    PT Frequency  2x / week    PT Duration  8 weeks     PT Treatment/Interventions  ADLs/Self Care Home Management;Cryotherapy;Electrical Stimulation;Moist Heat;Functional mobility training;Therapeutic activities;Therapeutic exercise;Patient/family education;Manual techniques;Vasopneumatic Device;Taping;Passive range of motion;Scar mobilization    PT Next Visit Plan  wrist supination PROM; Rt thumb mobilization/manual; progress wrist/elbow/shoulder strength    PT Home Exercise Plan  Access Code: [email or web address]     Consulted and Agree with Plan of Care  Patient       Patient will benefit from skilled therapeutic intervention in order to improve the following deficits and impairments:  Abnormal gait, Impaired flexibility, Impaired UE functional use, Decreased endurance, Decreased activity tolerance, Decreased range of motion, Decreased strength, Pain  Visit Diagnosis: 1. Stiffness of right elbow, not elsewhere classified   2. Pain in right elbow   3. Muscle weakness (generalized)   4. Localized edema        Problem List Patient Active Problem List   Diagnosis Date Noted  . Radial head fracture, closed 06/16/2018  . Closed fracture dislocation of elbow 06/16/2018  . Other fatigue 07/18/2017  . Shortness of breath on exertion 07/18/2017  . Type 2 diabetes mellitus with retinopathy, with long-term current use of insulin (HCC) 07/18/2017  . Vitamin D deficiency  07/18/2017  . B12 nutritional deficiency 07/18/2017  . Other specified hypothyroidism 07/18/2017  . Acute pain of right knee 12/22/2016  . Trigger index finger of left hand 09/22/2016  . 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 Diabetes (HCC) 01/08/2014  . Essential hypertension 01/08/2014  . Hyperlipidemia 01/08/2014  . Postoperative anemia due to acute blood loss 11/22/2012  . Hyponatremia 11/06/2012  . OA (osteoarthritis) of knee  11/05/2012  . Villonodular synovitis of knee 04/20/2011    4:38 PM,09/21/18 Donita BrooksSara Bishoy Cupp PT, DPT [facility]  [phone]  [facility] [address], [address] [place], [place], [address] Phone: [phone]   Fax:  [phone]  Name: Stefanie DeemBonnie D Gonzales MRN: [id number] Date of Birth: 05-14-1950

## 2018-09-21 NOTE — Patient Instructions (Signed)
Access Code: [email or web address]  URL: [email or web address]  Date: 09/21/2018  Prepared by: Sherol Dade   Exercises  Seated Gripping Towel - 10 reps - 3 sets - 3x daily - 7x weekly  Standing Bicep Curls with Resistance - 10 reps - 2 sets - 1x daily - 7x weekly  Standing Elbow Extension with Anchored Resistance - 10 reps - 2 sets - 1x daily - 7x weekly  Forearm Pronation and Supination with Hammer - 10 reps - 2 sets - 1x daily - 7x weekly    [facility] [address] [place], [place] [address] Phone # [phone] Fax [phone]

## 2018-09-22 MED FILL — AMLODIPINE BESYLATE 5 MG TA: 5 | 90 days supply | Qty: 90 | Fill #2

## 2018-09-24 ENCOUNTER — Ambulatory Visit: Payer: 59

## 2018-09-24 ENCOUNTER — Other Ambulatory Visit: Payer: Self-pay

## 2018-09-24 DIAGNOSIS — M25621 Stiffness of right elbow, not elsewhere classified: Secondary | ICD-10-CM | POA: Diagnosis not present

## 2018-09-24 DIAGNOSIS — M6281 Muscle weakness (generalized): Secondary | ICD-10-CM

## 2018-09-24 DIAGNOSIS — R600 Localized edema: Secondary | ICD-10-CM | POA: Diagnosis not present

## 2018-09-24 DIAGNOSIS — M25521 Pain in right elbow: Secondary | ICD-10-CM

## 2018-09-24 MED FILL — DULOXETINE HCL 60 MG CPEP: 60 | 30 days supply | Qty: 30 | Fill #2

## 2018-09-24 NOTE — Therapy (Signed)
[facility] [address], [place], [place], [address] Phone: [phone]   Fax:  [phone]  Physical Therapy Treatment  Patient Details  Name: [name] MRN: [id number] Date of Birth: 08/24/1950 Referring Provider (PT): Ophelia Charter, MD   Encounter Date: 09/24/2018  PT End of Session - 09/24/18 1608    Visit Number  13    Date for PT Re-Evaluation  10/03/18    Authorization Type  UMR    PT Start Time  1527    PT Stop Time  3716    PT Time Calculation (min)  54 min    Activity Tolerance  Patient tolerated treatment well;No increased pain    Behavior During Therapy  WFL for tasks assessed/performed       Past Medical History:  Diagnosis Date  . A-fib ([place])   . Anemia    hx of  . Arthritis   . Chest pain   . Constipation   . Depression   . Diabetes mellitus [facility]  . Diabetic retinopathy ([place])   . Dyspnea    with minimal exertion-deconditioned  . Dyspnea   . Dysrhythmia    a-fib  . Food allergy   . GERD (gastroesophageal reflux disease)    occasional  . Gout   . History of kidney stones   . Hypercholesteremia   . Hyperlipidemia   . Hypertension   . Hypertensive kidney disease   . Hypothyroidism   . Knee pain, right   . 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 Obesity   . OSA on CPAP    uses CPAP nightly  . Osteoarthritis   . Palpitations   . Pinched nerve in neck   . Pleurisy   . Pneumonia    hx of  . PONV (postoperative nausea and vomiting)    for 3-4 days after general anesthesia  . Sciatica   . Swelling of knee joint, right   . Synovitis of knee RIGHT  . Trigger finger, left    left index  . Vitamin D deficiency     Past Surgical History:  Procedure Laterality Date  . CESAREAN SECTION  X3  . KNEE ARTHROSCOPY  04/20/2011   Procedure: ARTHROSCOPY KNEE;  Surgeon: Gearlean Alf, MD;  Location[facility];  Service: Orthopedics;  Laterality: Right;   WITH SYNOVECTOMY  . LEFT CARPAL TUNNEL / LEFT MIDDLE & RING FINGER TRIGGER RELEASE  08-26-2008  . LEFT SHOULDER ARTHROSCOPY W/ DEBRIDEMENT  09-09-2003  . LEFT SHOULDER ARTHROSCOPY/ LEFT THUMB TRIGGER RELEASE  02-22-2005  . PHOTOCOAGULATION WITH LASER Right 03/20/2018   Procedure: PHOTOCOAGULATION WITH LASER;  Surgeon: Jalene Mullet, MD;  Location: [place];  Service: Ophthalmology;  Laterality: Right;  . PULLEY RELEASE LEFT LONG FINGER  07-14-2009  . RADIAL HEAD ARTHROPLASTY Right 06/17/2018   Procedure: RADIAL HEAD ARTHROPLASTY;  Surgeon: Hiram Gash, MD;  Location: [place];  Service: Orthopedics;  Laterality: Right;  . RIGHT CARPAL TUNNEL/ RIGHT THUMB TRIGGER RELEASE'S  11-28-2006  . RIGHT SHOULDER ARTHROSCOPY W/ ROTATOR CUFF REPAIR  01-13-2004  . SHOULDER ARTHROSCOPY DISTAL CLAVICLE EXCISION AND OPEN ROTATOR CUFF REPAIR  09-07-2004   LEFT  . SHOULDER ARTHROSCOPY W/ ACROMIAL REPAIR  11-29-2005   LEFT  . TONSILLECTOMY AND ADENOIDECTOMY  child  . TOTAL KNEE ARTHROPLASTY  12-14-2009   RIGHT  . TOTAL KNEE ARTHROPLASTY Left 11/05/2012   Procedure: LEFT TOTAL KNEE ARTHROPLASTY;  Surgeon: Gearlean Alf, MD;  Location: [facility];  Service: Orthopedics;  Laterality: Left;  . TOTAL KNEE REVISION Right 07/29/2016   Procedure: RIGHT KNEE POLY-LINER EXCHANGE;  Surgeon: Kathryne HitchBlackman, Christopher Y, MD;  Location: [facility];  Service: Orthopedics;  Laterality: Right;  . TRIGGER FINGER RELEASE Right 02/21/2013   Procedure: RIGHT RING A-1 PULLEY RELEASE    (MINOR PROCEDURE) ;  Surgeon: Wyn Forsterobert V Sypher Jr., MD;  [facility];  Service: Orthopedics;  Laterality: Right;  . TRIGGER FINGER RELEASE Left 09/22/2016   Procedure: RELEASE TRIGGER FINGER LEFT INDEX FINGER;  Surgeon: Kathryne HitchBlackman, Christopher Y, MD;  [facility];  Service: Orthopedics;  Laterality: Left;  . TRIGGER FINGER RELEASE Right 01/20/2017   Procedure: RELEASE TRIGGER FINGER/A-1 PULLEY RIGHT INDEX FINGER;  Surgeon: Marcene Corningalldorf, Peter, MD;   Location: [facility];  Service: Orthopedics;  Laterality: Right;  . VITRECTOMY 25 GAUGE WITH SCLERAL BUCKLE Right 03/20/2018   Procedure: RIGHT EYE VITRECTOMY WITH  ENDOLASER PARENTAL PHOTOCOAGULATION 25 GAUGE;  Surgeon: Carmela RimaPatel, Narendra, MD;  [facility];  Service: Ophthalmology;  Laterality: Right;    There were no vitals filed for this visit.  Subjective Assessment - 09/24/18 1530    Subjective  I am pretty sore today.  I might have overdone it with the exercises at home.    Pertinent History  elbow surgery: coronoid ORIF/radial head arhtroplasty/lateral ligament repair on 06/16/18  avoid elbow varus, avoid shoulder abduction in upright position    Currently in Pain?  Yes    Pain Score  6     Pain Location  Elbow    Pain Orientation  Right    Pain Descriptors / Indicators  Discomfort;Aching    Pain Type  Surgical pain    Pain Onset  More than a month ago    Pain Frequency  Constant    Aggravating Factors   work task, use of arm    Pain Relieving Factors  rest, ice, soft tissue work                       [facility] Adult PT Treatment/Exercise - 09/24/18 0001      Shoulder Exercises: ROM/Strengthening   UBE (Upper Arm Bike)  L1 - 3/3 fwd/back   PTA present to discuss MD appt     Shoulder Exercises: Stretch   Other Shoulder Stretches  Rt wrist extensor stretch x20 sec      Wrist Exercises   Wrist Extension  Strengthening;Right;20 reps;Seated    Bar Weights/Barbell (Wrist Extension)  2 lbs    Wrist Radial Deviation  Strengthening;Right;20 reps    Bar Weights/Barbell (Radial Deviation)  2 lbs    Other wrist exercises  Rt wrist pronation/supination 1# 2x10 reps     Other wrist exercises  Rt bicep curl 2# x15 reps    3# was too heavy)     Cryotherapy   Number Minutes Cryotherapy  10 Minutes    Cryotherapy Location  Wrist;Forearm    Type of Cryotherapy  Ice pack      Manual Therapy   Joint Mobilization  Rt proximal and distal radial mobilization to  improve supination    Soft tissue mobilization  STM wrist extensors, trigger point release Rt adductor pollicis               PT Short Term Goals - 08/30/18 1034      PT SHORT TERM GOAL #3   Title  report < or = to 2/10 Rt UE pain  with use at home    Baseline  up to 6/10    Period  Weeks    Status  On-going        PT Long Term Goals - 09/24/18 1611      PT LONG TERM GOAL #1   Title  be independent in advanced HEP    Time  8    Period  Weeks      PT LONG TERM GOAL #5   Title  report > or = to 70% use of Rt UE for home, work and self-care tasks    Baseline  using 70-90%    Time  8    Period  Weeks    Status  On-going            Plan - 09/24/18 1537    Clinical Impression Statement  Pt with increased pain levels today in the Rt elbow after work today.  Pt is performing all strength and flexibility exercises at home and experiences soreness with supination/pronation.  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  PT performed soft tissue and joint mobilizations to improve Rt elbow A/ROM.  Pt will continue to benefit from skilled PT to improve functional strength and ROM to improve use at home and work.    PT Frequency  2x / week    PT Duration  8 weeks    PT Treatment/Interventions  ADLs/Self Care Home Management;Cryotherapy;Electrical Stimulation;Moist Heat;Functional mobility training;Therapeutic activities;Therapeutic exercise;Patient/family education;Manual techniques;Vasopneumatic Device;Taping;Passive range of motion;Scar mobilization    PT Next Visit Plan  wrist supination PROM; progress wrist/elbow/shoulder strength    PT Home Exercise Plan  Access Code: [email or web address]     Consulted and Agree with Plan of Care  Patient       Patient will benefit from skilled therapeutic intervention in order to improve the following deficits and impairments:  Abnormal gait, Impaired flexibility,  Impaired UE functional use, Decreased endurance, Decreased activity tolerance, Decreased range of motion, Decreased strength, Pain  Visit Diagnosis: 1. Stiffness of right elbow, not elsewhere classified   2. Pain in right elbow   3. Muscle weakness (generalized)        Problem List Patient Active Problem List   Diagnosis Date Noted  . Radial head fracture, closed 06/16/2018  . Closed fracture dislocation of elbow 06/16/2018  . Other fatigue 07/18/2017  . Shortness of breath on exertion 07/18/2017  . Type 2 diabetes mellitus with retinopathy, with long-term current use of insulin (H[facility]) 07/18/2017  . Vitamin D deficiency 07/18/2017  . B12 nutritional deficiency 07/18/2017  . Other specified hypothyroidism 07/18/2017  . Acute pain of right knee 12/22/2016  . Trigger index finger of left hand 09/22/2016  . 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 Diabetes (HCC) 01/08/2014  . Essential hypertension 01/08/2014  . Hyperlipidemia 01/08/2014  . Postoperative anemia due to acute blood loss 11/22/2012  . Hyponatremia 11/06/2012  . OA (osteoarthritis) of knee 11/05/2012  . Villonodular synovitis of knee 04/20/2011    Lorrene ReidKelly , PT 09/24/18 4:12 PM  [facility] [address], [address] [place], [place], [address] Phone: [phone]   Fax:  [phone]  Name: [name] MRN: [id number] Date of Birth: 1950-06-19

## 2018-09-26 ENCOUNTER — Ambulatory Visit: Payer: 59

## 2018-09-26 ENCOUNTER — Other Ambulatory Visit: Payer: Self-pay

## 2018-09-26 DIAGNOSIS — R600 Localized edema: Secondary | ICD-10-CM

## 2018-09-26 DIAGNOSIS — M25621 Stiffness of right elbow, not elsewhere classified: Secondary | ICD-10-CM | POA: Diagnosis not present

## 2018-09-26 DIAGNOSIS — M6281 Muscle weakness (generalized): Secondary | ICD-10-CM

## 2018-09-26 DIAGNOSIS — M25521 Pain in right elbow: Secondary | ICD-10-CM | POA: Diagnosis not present

## 2018-09-26 NOTE — Therapy (Signed)
[facility] [address], [address] [place], [place], [address] Phone: [phone]   Fax:  [phone]  Physical Therapy Treatment  Patient Details  Name: Stefanie DeemBonnie D Ravan MRN: [id number] Date of Birth: 04/09/1950 Referring Provider (PT): Ramond MarrowVarkey, Dax, MD   Encounter Date: 09/26/2018  PT End of Session - 09/26/18 1607    Visit Number  14    Date for PT Re-Evaluation  10/03/18    Authorization Type  UMR    PT Start Time  1525    PT Stop Time  1621    PT Time Calculation (min)  56 min       Past Medical History:  Diagnosis Date  . A-fib ([facility])   . Anemia    hx of  . Arthritis   . Chest pain   . Constipation   . Depression   . Diabetes mellitus [facility]  . Diabetic retinopathy ([facility])   . Dyspnea    with minimal exertion-deconditioned  . Dyspnea   . Dysrhythmia    a-fib  . Food allergy   . GERD (gastroesophageal reflux disease)    occasional  . Gout   . History of kidney stones   . Hypercholesteremia   . Hyperlipidemia   . Hypertension   . Hypertensive kidney disease   . Hypothyroidism   . Knee pain, right   . 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 Obesity   . OSA on CPAP    uses CPAP nightly  . Osteoarthritis   . Palpitations   . Pinched nerve in neck   . Pleurisy   . Pneumonia    hx of  . PONV (postoperative nausea and vomiting)    for 3-4 days after general anesthesia  . Sciatica   . Swelling of knee joint, right   . Synovitis of knee RIGHT  . Trigger finger, left    left index  . Vitamin D deficiency     Past Surgical History:  Procedure Laterality Date  . CESAREAN SECTION  X3  . KNEE ARTHROSCOPY  04/20/2011   Procedure: ARTHROSCOPY KNEE;  Surgeon: Loanne DrillingFrank V Aluisio, MD;  [facility];  Service: Orthopedics;  Laterality: Right;  WITH SYNOVECTOMY  . LEFT CARPAL TUNNEL / LEFT MIDDLE & RING FINGER TRIGGER RELEASE  08-26-2008  . LEFT SHOULDER ARTHROSCOPY W/  DEBRIDEMENT  09-09-2003  . LEFT SHOULDER ARTHROSCOPY/ LEFT THUMB TRIGGER RELEASE  02-22-2005  . PHOTOCOAGULATION WITH LASER Right 03/20/2018   Procedure: PHOTOCOAGULATION WITH LASER;  Surgeon: Carmela RimaPatel, Narendra, MD;  [facility];  Service: Ophthalmology;  Laterality: Right;  . PULLEY RELEASE LEFT LONG FINGER  07-14-2009  . RADIAL HEAD ARTHROPLASTY Right 06/17/2018   Procedure: RADIAL HEAD ARTHROPLASTY;  Surgeon: Bjorn PippinVarkey, Dax T, MD;  [facility];  Service: Orthopedics;  Laterality: Right;  . RIGHT CARPAL TUNNEL/ RIGHT THUMB TRIGGER RELEASE'S  11-28-2006  . RIGHT SHOULDER ARTHROSCOPY W/ ROTATOR CUFF REPAIR  01-13-2004  . SHOULDER ARTHROSCOPY DISTAL CLAVICLE EXCISION AND OPEN ROTATOR CUFF REPAIR  09-07-2004   LEFT  . SHOULDER ARTHROSCOPY W/ ACROMIAL REPAIR  11-29-2005   LEFT  . TONSILLECTOMY AND ADENOIDECTOMY  child  . TOTAL KNEE ARTHROPLASTY  12-14-2009   RIGHT  . TOTAL KNEE ARTHROPLASTY Left 11/05/2012   Procedure: LEFT TOTAL KNEE ARTHROPLASTY;  Surgeon: Loanne DrillingFrank V Aluisio, MD;  [facility];  Service: Orthopedics;  Laterality: Left;  . TOTAL KNEE REVISION Right 07/29/2016   Procedure: RIGHT KNEE POLY-LINER EXCHANGE;  Surgeon: Kathryne HitchBlackman, Christopher Y, MD;  [facility];  Service: Orthopedics;  Laterality: Right;  . TRIGGER FINGER RELEASE Right 02/21/2013   Procedure: RIGHT RING A-1 PULLEY RELEASE    (MINOR PROCEDURE) ;  Surgeon: Wyn Forsterobert V Sypher Jr., MD;  [facility] [place];  Service: Orthopedics;  Laterality: Right;  . TRIGGER FINGER RELEASE Left 09/22/2016   Procedure: RELEASE TRIGGER FINGER LEFT INDEX FINGER;  Surgeon: Kathryne HitchBlackman, Christopher Y, MD;  [facility];  Service: Orthopedics;  Laterality: Left;  . TRIGGER FINGER RELEASE Right 01/20/2017   Procedure: RELEASE TRIGGER FINGER/A-1 PULLEY RIGHT INDEX FINGER;  Surgeon: Marcene Corningalldorf, Peter, MD;  Location: [facility];  Service: Orthopedics;  Laterality: Right;  . VITRECTOMY 25 GAUGE WITH SCLERAL BUCKLE  Right 03/20/2018   Procedure: RIGHT EYE VITRECTOMY WITH  ENDOLASER PARENTAL PHOTOCOAGULATION 25 GAUGE;  Surgeon: Carmela RimaPatel, Narendra, MD;  [facility];  Service: Ophthalmology;  Laterality: Right;    There were no vitals filed for this visit.  Subjective Assessment - 09/26/18 1527    Subjective  I am much better today.         [facility] PT Assessment - 09/26/18 0001      Strength   Strength Assessment Site  Elbow    Right Shoulder Flexion  4+/5    Right Shoulder ABduction  4+/5    Right/Left Elbow  Right;Left    Right Elbow Flexion  4+/5    Right Elbow Extension  4+/5    Right Hand Grip (lbs)  39                   [facility] Adult PT Treatment/Exercise - 09/26/18 0001      Shoulder Exercises: Seated   Flexion  Strengthening;Right;Left;20 reps;Weights    Flexion Weight (lbs)  1      Shoulder Exercises: ROM/Strengthening   UBE (Upper Arm Bike)  L1 - 3/3 fwd/back   PT present to assess status     Shoulder Exercises: Stretch   Other Shoulder Stretches  Rt wrist extensor stretch x20 sec      Wrist Exercises   Wrist Extension  Strengthening;Right;20 reps;Seated    Bar Weights/Barbell (Wrist Extension)  2 lbs    Wrist Radial Deviation  Strengthening;Right;20 reps    Bar Weights/Barbell (Radial Deviation)  2 lbs    Wrist Ulnar Deviation Limitations  velco board: supination/pronation on wide strip x10 reps    Other wrist exercises  Rt wrist pronation/supination 2# 2x10 reps     Other wrist exercises  Rt bicep curl 2# x15 reps    3# was too heavy)     Cryotherapy   Number Minutes Cryotherapy  10 Minutes    Cryotherapy Location  Wrist;Forearm    Type of Cryotherapy  Ice pack      Manual Therapy   Joint Mobilization  Rt proximal and distal radial mobilization to improve supination    Soft tissue mobilization  STM wrist extensors, trigger point release Rt adductor pollicis               PT Short Term Goals - 08/30/18 1034      PT SHORT TERM GOAL #3   Title   report < or = to 2/10 Rt UE pain with use at home    Baseline  up to 6/10    Period  Weeks    Status  On-going        PT Long Term Goals - 09/26/18 1529  PT LONG TERM GOAL #1   Title  be independent in advanced HEP    Time  8    Period  Weeks    Status  On-going      PT LONG TERM GOAL #5   Title  report > or = to 70% use of Rt UE for home, work and self-care tasks    Status  Achieved            Plan - 09/26/18 1539    Clinical Impression Statement  Pt continues to report Rt elbow and wrist pain s/p surgery, pain is significantly better today. Pt is performing all strength and flexibility exercises at home.  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  Pt with improved grip strength to 39# and Rt shoulder and elbow strength are improved. Pt demonstrated fatigue with supination/pronation with Velcro board. Pt will continue to benefit from skilled PT for Rt UE strength, endurance and flexibility.    PT Frequency  2x / week    PT Duration  8 weeks    PT Treatment/Interventions  ADLs/Self Care Home Management;Cryotherapy;Electrical Stimulation;Moist Heat;Functional mobility training;Therapeutic activities;Therapeutic exercise;Patient/family education;Manual techniques;Vasopneumatic Device;Taping;Passive range of motion;Scar mobilization    PT Next Visit Plan  wrist supination PROM; progress wrist/elbow/shoulder strength.    PT Home Exercise Plan  Access Code: [email or web address]     Consulted and Agree with Plan of Care  Patient       Patient will benefit from skilled therapeutic intervention in order to improve the following deficits and impairments:  Abnormal gait, Impaired flexibility, Impaired UE functional use, Decreased endurance, Decreased activity tolerance, Decreased range of motion, Decreased strength, Pain  Visit Diagnosis: 1. Pain in right elbow   2. Stiffness of right elbow, not elsewhere classified   3. Muscle weakness  (generalized)   4. Localized edema        Problem List Patient Active Problem List   Diagnosis Date Noted  . Radial head fracture, closed 06/16/2018  . Closed fracture dislocation of elbow 06/16/2018  . Other fatigue 07/18/2017  . Shortness of breath on exertion 07/18/2017  . Type 2 diabetes mellitus with retinopathy, with long-term current use of insulin ([place]) 07/18/2017  . Vitamin D deficiency 07/18/2017  . B12 nutritional deficiency 07/18/2017  . Other specified hypothyroidism 07/18/2017  . Acute pain of right knee 12/22/2016  . Trigger index finger of left hand 09/22/2016  . 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 Diabetes ([place]) 01/08/2014  . Essential hypertension 01/08/2014  . Hyperlipidemia 01/08/2014  . Postoperative anemia due to acute blood loss 11/22/2012  . Hyponatremia 11/06/2012  . OA (osteoarthritis) of knee 11/05/2012  . Villonodular synovitis of knee 04/20/2011     Sigurd Sos, PT 09/26/18 4:09 PM   [facility] [address] [place], [place], [address] Phone: [phone]   Fax:  [phone]  Name: NARIA ABBEY MRN: [id number] Date of Birth: 07/25/1950

## 2018-09-27 DIAGNOSIS — E113511 Type 2 diabetes mellitus with proliferative diabetic retinopathy with macular edema, right eye: Secondary | ICD-10-CM | POA: Diagnosis not present

## 2018-09-27 DIAGNOSIS — H34831 Tributary (branch) retinal vein occlusion, right eye, with macular edema: Secondary | ICD-10-CM | POA: Diagnosis not present

## 2018-09-27 DIAGNOSIS — H25813 Combined forms of age-related cataract, bilateral: Secondary | ICD-10-CM | POA: Diagnosis not present

## 2018-09-27 DIAGNOSIS — H3582 Retinal ischemia: Secondary | ICD-10-CM | POA: Diagnosis not present

## 2018-09-27 DIAGNOSIS — E113312 Type 2 diabetes mellitus with moderate nonproliferative diabetic retinopathy with macular edema, left eye: Secondary | ICD-10-CM | POA: Diagnosis not present

## 2018-09-27 DIAGNOSIS — H3581 Retinal edema: Secondary | ICD-10-CM | POA: Diagnosis not present

## 2018-10-02 ENCOUNTER — Encounter: Payer: Self-pay | Admitting: Physical Therapy

## 2018-10-02 ENCOUNTER — Ambulatory Visit: Payer: 59 | Admitting: Physical Therapy

## 2018-10-02 ENCOUNTER — Other Ambulatory Visit: Payer: Self-pay

## 2018-10-02 DIAGNOSIS — R600 Localized edema: Secondary | ICD-10-CM | POA: Diagnosis not present

## 2018-10-02 DIAGNOSIS — M25621 Stiffness of right elbow, not elsewhere classified: Secondary | ICD-10-CM | POA: Diagnosis not present

## 2018-10-02 DIAGNOSIS — M6281 Muscle weakness (generalized): Secondary | ICD-10-CM

## 2018-10-02 DIAGNOSIS — M25521 Pain in right elbow: Secondary | ICD-10-CM | POA: Diagnosis not present

## 2018-10-02 NOTE — Therapy (Signed)
[facility] [address], [name], [place], [address] Phone: [phone]   Fax:  [phone]  Physical Therapy Treatment/Discharge  Patient Details  Name: [name] MRN: [id number] Date of Birth: 1950-03-31 Referring Provider (PT): Ophelia Charter, MD   Encounter Date: 10/02/2018  PT End of Session - 10/02/18 1641    Visit Number  15    Date for PT Re-Evaluation  10/03/18    Authorization Type  [facility]    PT Start Time  1600    PT Stop Time  1645    PT Time Calculation (min)  45 min    Activity Tolerance  No increased pain    Behavior During Therapy  [facility] for tasks assessed/performed       Past Medical History:  Diagnosis Date  . A-fib ([place])   . Anemia    hx of  . Arthritis   . Chest pain   . Constipation   . Depression   . Diabetes mellitus [facility]  . Diabetic retinopathy ([place])   . Dyspnea    with minimal exertion-deconditioned  . Dyspnea   . Dysrhythmia    a-fib  . Food allergy   . GERD (gastroesophageal reflux disease)    occasional  . Gout   . History of kidney stones   . Hypercholesteremia   . Hyperlipidemia   . Hypertension   . Hypertensive kidney disease   . Hypothyroidism   . Knee pain, right   . 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 Obesity   . OSA on CPAP    uses CPAP nightly  . Osteoarthritis   . Palpitations   . Pinched nerve in neck   . Pleurisy   . Pneumonia    hx of  . PONV (postoperative nausea and vomiting)    for 3-4 days after general anesthesia  . Sciatica   . Swelling of knee joint, right   . Synovitis of knee RIGHT  . Trigger finger, left    left index  . Vitamin D deficiency     Past Surgical History:  Procedure Laterality Date  . CESAREAN SECTION  X3  . KNEE ARTHROSCOPY  04/20/2011   Procedure: ARTHROSCOPY KNEE;  Surgeon: Gearlean Alf, MD;  Location: [facility];  Service: Orthopedics;  Laterality: Right;  WITH SYNOVECTOMY  .  LEFT CARPAL TUNNEL / LEFT MIDDLE & RING FINGER TRIGGER RELEASE  08-26-2008  . LEFT SHOULDER ARTHROSCOPY W/ DEBRIDEMENT  09-09-2003  . LEFT SHOULDER ARTHROSCOPY/ LEFT THUMB TRIGGER RELEASE  02-22-2005  . PHOTOCOAGULATION WITH LASER Right 03/20/2018   Procedure: PHOTOCOAGULATION WITH LASER;  Surgeon: Jalene Mullet, MD;  Location: [place];  Service: Ophthalmology;  Laterality: Right;  . PULLEY RELEASE LEFT LONG FINGER  07-14-2009  . RADIAL HEAD ARTHROPLASTY Right 06/17/2018   Procedure: RADIAL HEAD ARTHROPLASTY;  Surgeon: Hiram Gash, MD;  Location: [place];  Service: Orthopedics;  Laterality: Right;  . RIGHT CARPAL TUNNEL/ RIGHT THUMB TRIGGER RELEASE'S  11-28-2006  . RIGHT SHOULDER ARTHROSCOPY W/ ROTATOR CUFF REPAIR  01-13-2004  . SHOULDER ARTHROSCOPY DISTAL CLAVICLE EXCISION AND OPEN ROTATOR CUFF REPAIR  09-07-2004   LEFT  . SHOULDER ARTHROSCOPY W/ ACROMIAL REPAIR  11-29-2005   LEFT  . TONSILLECTOMY AND ADENOIDECTOMY  child  . TOTAL KNEE ARTHROPLASTY  12-14-2009   RIGHT  . TOTAL KNEE ARTHROPLASTY Left 11/05/2012   Procedure: LEFT TOTAL KNEE ARTHROPLASTY;  Surgeon: Gearlean Alf, MD;  Location: [facility];  Service: Orthopedics;  Laterality: Left;  . TOTAL KNEE REVISION Right 07/29/2016   Procedure: RIGHT KNEE POLY-LINER EXCHANGE;  Surgeon: Mcarthur Rossetti, MD;  Location: [facility];  Service: Orthopedics;  Laterality: Right;  . TRIGGER FINGER RELEASE Right 02/21/2013   Procedure: RIGHT RING A-1 PULLEY RELEASE    (MINOR PROCEDURE) ;  Surgeon: Cammie Sickle., MD;  Location: [facility];  Service: Orthopedics;  Laterality: Right;  . TRIGGER FINGER RELEASE Left 09/22/2016   Procedure: RELEASE TRIGGER FINGER LEFT INDEX FINGER;  Surgeon: Mcarthur Rossetti, MD;  Location: [place];  Service: Orthopedics;  Laterality: Left;  . TRIGGER FINGER RELEASE Right 01/20/2017   Procedure: RELEASE TRIGGER FINGER/A-1 PULLEY RIGHT INDEX FINGER;  Surgeon: Melrose Nakayama, MD;  Location: [place];  Service: Orthopedics;  Laterality: Right;  . VITRECTOMY 25 GAUGE WITH SCLERAL BUCKLE Right 03/20/2018   Procedure: RIGHT EYE VITRECTOMY WITH  ENDOLASER PARENTAL PHOTOCOAGULATION 25 GAUGE;  Surgeon: Jalene Mullet, MD;  Location: [place];  Service: Ophthalmology;  Laterality: Right;    There were no vitals filed for this visit.  Subjective Assessment - 10/02/18 1607    Subjective  Pt states that her arm has been pretty good. She pushed some wheelchairs today, and it was a little sore. She still has some hesitation with gripping heavier objects.    Currently in Pain?  Other (Comment)   none currently        [facility] PT Assessment - 10/02/18 0001      Assessment   Medical Diagnosis  Rt terrible triad: coronoid ORIF, radial head arthroplasty/lateral ligament repair    Referring Provider (PT)  Ophelia Charter, MD    Onset Date/Surgical Date  06/16/18    Hand Dominance  Right    Next MD Visit  --    Prior Therapy  after surgery after surgery      Precautions   Precautions  Other (comment)    Precaution Comments  follow protocol, no lifting >10 lbs until 09/15/18      Restrictions   Weight Bearing Restrictions  No      [place] residence    [place] to enter    Entrance Stairs-Number of Steps  3      Prior Function   Level of Independence  Independent    Vocation  Full time employment    Vocation Requirements  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 [employer]   Overall Cognitive Status  Within [place] for tasks assessed      Observation/Other Assessments   Focus on Therapeutic Outcomes (FOTO)   44% limited       Posture/Postural Control   Posture/Postural Control  Postural limitations    Postural Limitations  Rounded Shoulders;Forward head      AROM   Overall AROM   Deficits    Overall AROM Comments  Lt shoulder and elbow full A/ROM, Rt shoulder full with  pain at end range    Right Elbow Flexion  135    Right Elbow Extension  20      PROM   Overall PROM   Deficits    Overall PROM Comments  20-135      Strength   Overall Strength  Due to precautions    Overall Strength Comments  --    Right Shoulder Flexion  4+/5    Right Shoulder ABduction  4+/5    Left Shoulder Flexion  5/5    Left Shoulder Extension  5/5    Left Shoulder ABduction  5/5    Left Shoulder Internal Rotation  5/5    Left Shoulder External Rotation  5/5    Right Elbow Flexion  4+/5    Right Elbow Extension  4+/5    Right Wrist Flexion  5/5    Right Wrist Extension  4+/5    Right Wrist Radial Deviation  5/5    Right Hand Grip (lbs)  39    Left Hand Grip (lbs)  65      Palpation   Palpation comment  surgical incision healed      Ambulation/Gait   Ambulation/Gait  Yes    Gait Pattern  Within Functional Limits                   [facility] Adult PT Treatment/Exercise - 10/02/18 0001      Exercises   Exercises  Other Exercises    Other Exercises   Rt wrist supination stretch 2x20 sec       Wrist Exercises   Wrist Ulnar Deviation Limitations  Velcro board supination/pronation; wrist flexion/extension x4    Other wrist exercises  Wrist extensor stretch 3x20 sec       Manual Therapy   Soft tissue mobilization  STM wrist extensors, Rt supinator              PT Education - 10/02/18 1641    Education Details  updated HEP    Person(s) Educated  Patient    Methods  Explanation;Handout    Comprehension  Verbalized understanding       PT Short Term Goals - 08/30/18 1034      PT SHORT TERM GOAL #3   Title  report < or = to 2/10 Rt UE pain with use at home    Baseline  up to 6/10    Period  Weeks    Status  On-going        PT Long Term Goals - 10/02/18 1649      PT LONG TERM GOAL #1   Status  New      PT LONG TERM GOAL #2   Title  reduce FOTO to < or = 43% limitation    Baseline  44%    Time  8    Period  Weeks    Status  Achieved       PT LONG TERM GOAL #3   Title  demonstrate > or = to 55# to hold objects safely    Baseline  39#    Time  8    Period  Weeks    Status  Partially Met      PT LONG TERM GOAL #4   Title  demonstrate > or = to 4/5 Rt elbow strength to improve endurance with use    Time  8    Period  Weeks    Status  Achieved      PT LONG TERM GOAL #5   Title  report > or = to 70% use of Rt UE for home, work and self-care tasks    Baseline  Pt self limiting and hesitant to lift more than 10 lb at this time, but she is able to do other work and self care tasks    Time  8    Period  Weeks    Status  Partially Met            Plan - 10/02/18 1652    Clinical Impression Statement  Pt was discharged this visit having met 50% of her goals. She feels that she has made good progress with PT and reports that she is able to return to her usual work tasks without significant difficulty. She does note Rt elbow or wrist pain intermittently with tasks such as gripping and when pushing heavier objects, but this will typically resolve after rest. Her UE strength is greater than 4/5 MMT and her grip strength although still limited is improved from her evaluation. Although she could continue to benefit from skilled PT to address her remaining limitations in strength, flexibility and UE functional use, she feels comfortable with d/c to allow her to continue working on her HEP independently.    PT Frequency  2x / week    PT Duration  8 weeks    PT Treatment/Interventions  ADLs/Self Care Home Management;Cryotherapy;Electrical Stimulation;Moist Heat;Functional mobility training;Therapeutic activities;Therapeutic exercise;Patient/family education;Manual techniques;Vasopneumatic Device;Taping;Passive range of motion;Scar mobilization    PT Home Exercise Plan  Access Code: [email or web address]     Consulted and Agree with Plan of Care  Patient       Patient will benefit from skilled therapeutic intervention in order to improve the  following deficits and impairments:  Abnormal gait, Impaired flexibility, Impaired UE functional use, Decreased endurance, Decreased activity tolerance, Decreased range of motion, Decreased strength, Pain  Visit Diagnosis: 1. Pain in right elbow   2. Stiffness of right elbow, not elsewhere classified   3. Muscle weakness (generalized)   4. Localized edema        Problem List Patient Active Problem List   Diagnosis Date Noted  . Radial head fracture, closed 06/16/2018  . Closed fracture dislocation of elbow 06/16/2018  . Other fatigue 07/18/2017  . Shortness of breath on exertion 07/18/2017  . Type 2 diabetes mellitus with retinopathy, with long-term current use of insulin ([name]) 07/18/2017  . Vitamin D deficiency 07/18/2017  . B12 nutritional deficiency 07/18/2017  . Other specified hypothyroidism 07/18/2017  . Acute pain of right knee 12/22/2016  . Trigger index finger of left hand 09/22/2016  . 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 Diabetes ([place]) 01/08/2014  . Essential hypertension 01/08/2014  . Hyperlipidemia 01/08/2014  . Postoperative anemia due to acute blood loss 11/22/2012  . Hyponatremia 11/06/2012  . OA (osteoarthritis) of knee 11/05/2012  . Villonodular synovitis of knee 04/20/2011   PHYSICAL THERAPY DISCHARGE SUMMARY  Visits from Start of Care: 15  Current functional level related to goals / functional outcomes: See above for more details    Remaining deficits: See above for more details    Education / Equipment: See above for more details   Plan: Patient agrees to discharge.  Patient goals were partially met. Patient is being discharged due to being pleased with the current functional level.  ?????       4:58 PM,10/02/18 Sherol Dade PT, [place] [facility] [place]  [facility] [address], [place], [place], [address] Phone: [phone]   Fax:  [phone]  Name: [name] MRN: [id number] Date of Birth: [birth year]

## 2018-10-02 NOTE — Patient Instructions (Signed)
Access Code: [email or web address]  URL: [email or web address]  Date: 10/02/2018  Prepared by: Sherol Dade   Exercises  Seated Gripping Towel - 10 reps - 3 sets - 3x daily - 7x weekly  Standing Bicep Curls with Resistance - 10 reps - 2 sets - 1x daily - 7x weekly  Standing Elbow Extension with Anchored Resistance - 10 reps - 2 sets - 1x daily - 7x weekly  Forearm Pronation and Supination with Hammer - 10 reps - 2 sets - 1x daily - 7x weekly  Seated Wrist Supination with Overpressure - 10 reps - 5 hold - 1x daily - 7x weekly  Standing Wrist Flexion Stretch - 10 reps - 5 hold - 1x daily - 7x weekly    [facility] [address], [place], [place] [address] Phone # [phone] Fax [phone]

## 2018-10-04 ENCOUNTER — Ambulatory Visit: Payer: 59

## 2018-10-07 MED FILL — TRULICITY 1.5 MG/0.5 ML PEN: 1.5 | 28 days supply | Qty: 2 | Fill #4

## 2018-10-07 MED FILL — LEVOTHYROXINE 125 MCG TABLE: 125 | 30 days supply | Qty: 30 | Fill #4

## 2018-10-07 MED FILL — HYDROCHLOROTHIAZIDE 12.5 MG: 12.5 | 90 days supply | Qty: 90 | Fill #2

## 2018-10-07 MED FILL — hydrALAZINE HCL 25 MG TABS: 25 | 30 days supply | Qty: 60 | Fill #1

## 2018-10-09 ENCOUNTER — Encounter: Payer: 59 | Admitting: Physical Therapy

## 2018-10-09 MED FILL — LOSARTAN POTASSIUM 100 MG T: 100 | 30 days supply | Qty: 30 | Fill #2

## 2018-10-09 MED FILL — LANTUS 100 UNITS/ML VIAL: 100 | 37 days supply | Qty: 30 | Fill #1

## 2018-10-17 DIAGNOSIS — E113511 Type 2 diabetes mellitus with proliferative diabetic retinopathy with macular edema, right eye: Secondary | ICD-10-CM | POA: Diagnosis not present

## 2018-10-22 DIAGNOSIS — G4733 Obstructive sleep apnea (adult) (pediatric): Secondary | ICD-10-CM | POA: Diagnosis not present

## 2018-10-22 DIAGNOSIS — M48061 Spinal stenosis, lumbar region without neurogenic claudication: Secondary | ICD-10-CM | POA: Diagnosis not present

## 2018-10-28 MED FILL — DULOXETINE HCL 60 MG CPEP: 60 | 30 days supply | Qty: 30 | Fill #3

## 2018-10-28 MED FILL — METOPROLOL TARTRATE 50 MG T: 50 | 90 days supply | Qty: 180 | Fill #1

## 2018-10-29 MED FILL — OMEPRAZOLE 20 MG CAP: 20 | 90 days supply | Qty: 90 | Fill #0

## 2018-10-31 MED FILL — hydrALAZINE HCL 25 MG TABS: 25 | 30 days supply | Qty: 60 | Fill #0

## 2018-11-06 ENCOUNTER — Other Ambulatory Visit (INDEPENDENT_AMBULATORY_CARE_PROVIDER_SITE_OTHER): Payer: Self-pay | Admitting: Orthopaedic Surgery

## 2018-11-06 DIAGNOSIS — H524 Presbyopia: Secondary | ICD-10-CM | POA: Diagnosis not present

## 2018-11-06 DIAGNOSIS — H5213 Myopia, bilateral: Secondary | ICD-10-CM | POA: Diagnosis not present

## 2018-11-06 MED FILL — METHOCARBAMOL 500 MG TABS: 500 | 15 days supply | Qty: 60 | Fill #0

## 2018-11-06 MED FILL — TRULICITY 1.5 MG/0.5 ML PEN: 1.5 | 28 days supply | Qty: 2 | Fill #5

## 2018-11-06 MED FILL — HYDROCODON-APAP 5-325: 5-325 | 8 days supply | Qty: 30 | Fill #0

## 2018-11-06 NOTE — Telephone Encounter (Signed)
Please advise 

## 2018-11-07 MED FILL — LANTUS 100 UNITS/ML VIAL: 100 | 86 days supply | Qty: 70 | Fill #0

## 2018-11-19 MED FILL — LEVOTHYROXINE 125 MCG TABLE: 125 | 30 days supply | Qty: 30 | Fill #5

## 2018-11-19 MED FILL — LOSARTAN POTASSIUM 100 MG T: 100 | 30 days supply | Qty: 30 | Fill #3

## 2018-11-20 DIAGNOSIS — G4733 Obstructive sleep apnea (adult) (pediatric): Secondary | ICD-10-CM | POA: Diagnosis not present

## 2018-11-20 MED FILL — metFORMIN HCL ER 500 MG TB2: 500 | 90 days supply | Qty: 180 | Fill #0

## 2018-11-20 MED FILL — FLECAINIDE ACETATE 50 MG TA: 50 | 90 days supply | Qty: 180 | Fill #1

## 2018-11-21 DIAGNOSIS — E113511 Type 2 diabetes mellitus with proliferative diabetic retinopathy with macular edema, right eye: Secondary | ICD-10-CM | POA: Diagnosis not present

## 2018-11-22 DIAGNOSIS — G4733 Obstructive sleep apnea (adult) (pediatric): Secondary | ICD-10-CM | POA: Diagnosis not present

## 2018-11-22 DIAGNOSIS — M48061 Spinal stenosis, lumbar region without neurogenic claudication: Secondary | ICD-10-CM | POA: Diagnosis not present

## 2018-11-25 MED FILL — DULOXETINE HCL 60 MG CPEP: 60 | 30 days supply | Qty: 30 | Fill #4

## 2018-11-26 MED FILL — hydrALAZINE HCL 25 MG TABS: 25 | 30 days supply | Qty: 60 | Fill #1

## 2018-11-27 DIAGNOSIS — G4733 Obstructive sleep apnea (adult) (pediatric): Secondary | ICD-10-CM | POA: Diagnosis not present

## 2018-12-06 ENCOUNTER — Ambulatory Visit (INDEPENDENT_AMBULATORY_CARE_PROVIDER_SITE_OTHER): Payer: 59 | Admitting: Family Medicine

## 2018-12-06 ENCOUNTER — Other Ambulatory Visit: Payer: Self-pay

## 2018-12-06 VITALS — BP 146/72 | HR 66 | Temp 98.0°F | Ht 61.0 in | Wt 257.0 lb

## 2018-12-06 DIAGNOSIS — I10 Essential (primary) hypertension: Secondary | ICD-10-CM

## 2018-12-06 DIAGNOSIS — E119 Type 2 diabetes mellitus without complications: Secondary | ICD-10-CM

## 2018-12-06 DIAGNOSIS — Z794 Long term (current) use of insulin: Secondary | ICD-10-CM | POA: Diagnosis not present

## 2018-12-06 DIAGNOSIS — Z6841 Body Mass Index (BMI) 40.0 and over, adult: Secondary | ICD-10-CM | POA: Diagnosis not present

## 2018-12-10 NOTE — Progress Notes (Signed)
Office: [phone]  /  Fax: [phone]   HPI:   Chief Complaint: OBESITY [name] is here to discuss her progress with her obesity treatment plan. She is on the  follow the Category 2 plan and is following her eating plan approximately 0 % of the time. She states she is exercising by walking for 30 minutes 7 times per week. [name] last visit was 7 months ago. She has gained some weight but mostly has maintained her weight loss. She has had a lot of health issues and has not been able to concentrate on weight loss.  Her weight is 257 lb (116.6 kg) today and has had a weight loss of 5 pounds over a period of 7 months since her last in office visit. She has lost 8 lbs since starting treatment with [name].  Hypertension [name] is a 68 y.o. female with hypertension.  [name] denies chest pain, headache, dizziness, or shortness of breath on exertion. She is working weight loss to help control her blood pressure with the goal of decreasing her risk of heart attack and stroke. Bonnies blood pressure is elevated today. She states her blood pressure is normally well controlled.   Diabetes II [name] has a diagnosis of diabetes type II. [name] states denies any hypoglycemic episodes. She states her Last A1c was 10 at her PCP office.  She is ready to work on intensive lifestyle modifications including diet, exercise, and weight loss to help control her blood glucose levels.  ASSESSMENT AND PLAN:  Essential hypertension  Type 2 diabetes mellitus without complication, with long-term current use of insulin (HCC)  Class 3 severe obesity with serious comorbidity and body mass index (BMI) of 45.0 to 49.9 in adult, unspecified obesity type ([place])  PLAN: Hypertension We discussed sodium restriction, working on healthy weight loss, and a regular exercise program as the means to achieve improved blood pressure control. [name] agreed with this plan and agreed to follow up as directed. We will  continue to monitor her blood pressure as well as her progress with the above lifestyle modifications. She will continue her medications as prescribed and will watch for signs of hypotension as she continues her lifestyle modifications. We will recheck blood pressure in 2 weeks.   Diabetes II [name] has been given extensive diabetes education by myself today including ideal fasting and post-prandial blood glucose readings, individual ideal HgA1c goals  and hypoglycemia prevention. We discussed the importance of good blood sugar control to decrease the likelihood of diabetic complications such as nephropathy, neuropathy, limb loss, blindness, coronary artery disease, and death. We discussed the importance of intensive lifestyle modification including diet, exercise and weight loss as the first line treatment for diabetes. [name] agrees to continue her diabetes medications and will follow up at the agreed upon time. She agrees to restart Category 2 plan and bring BG log to next visit.   I spent > than 50% of the 25 minute visit on counseling as documented in the note.   Obesity [name] is currently in the action stage of change. As such, her goal is to continue with weight loss efforts She has agreed to follow the Category 2 plan [name] has been instructed to work up to a goal of 150 minutes of combined cardio and strengthening exercise per week for weight loss and overall health benefits. We discussed the following Behavioral Modification Strategies today: work on meal planning and easy cooking plans and emotional eating strategies   [name] has agreed  to follow up with our clinic in 2 weeks. She was informed of the importance of frequent follow up visits to maximize her success with intensive lifestyle modifications for her multiple health conditions.  ALLERGIES: Allergies  Allergen Reactions  . Actos [Pioglitazone Hydrochloride] Swelling and Other (See Comments)    Numbness in lips, HEADACHES  SWELLING REACTION UNSPECIFIED   . Avandia [Rosiglitazone] Swelling    Numbness in lips, headaches  SWELLING REACTION UNSPECIFIED   . Tetanus Toxoids Other (See Comments)    Arm swelling, fainted, ?Respiratory distress  (horse serum)  . Food Nausea Only    Eggplant    MEDICATIONS: Current Outpatient Medications on File Prior to Visit  Medication Sig Dispense Refill  . amLODipine (NORVASC) 5 MG tablet Take 5 mg by mouth every evening.     [name] atorvastatin (LIPITOR) 40 MG tablet Take 40 mg by mouth every evening.     . calcium carbonate (CALCIUM 600) 600 MG TABS tablet Take 600 mg by mouth daily with breakfast.    . celecoxib (CELEBREX) 100 MG capsule Take 1 capsule (100 mg total) by mouth 2 (two) times daily. 60 capsule 2  . cholecalciferol (VITAMIN D3) 25 MCG (1000 UT) tablet Take 1,000 Units by mouth daily.    . DULoxetine (CYMBALTA) 60 MG capsule Take 60 mg by mouth daily.    . flecainide (TAMBOCOR) 50 MG tablet TAKE 1 TABLET BY MOUTH TWO TIMES DAILY (PLEASE KEEP MARCH APPOINTMENT) 180 tablet 3  . hydrochlorothiazide (MICROZIDE) 12.5 MG capsule Take 12.5 mg by mouth daily.   3  . HYDROcodone-acetaminophen (NORCO/VICODIN) 5-325 MG tablet TAKE 1 TABLET BY MOUTH EVERY 6 HOURS AS NEEDED FOR MODERATE PAIN 30 tablet 0  . insulin glargine (LANTUS) 100 UNIT/ML injection Inject 48 Units into the skin 2 (two) times daily.     [name] levothyroxine (SYNTHROID, LEVOTHROID) 125 MCG tablet Take 125 mcg by mouth daily before breakfast.     . Melatonin 5 MG CAPS Take 5-10 mg by mouth at bedtime as needed (sleep).     . metFORMIN (GLUCOPHAGE-XR) 500 MG 24 hr tablet Take 1,000 mg by mouth daily with supper.    . methocarbamol (ROBAXIN) 500 MG tablet TAKE 1 TABLET BY MOUTH EVERY 6 HOURS AS NEEDED FOR MUSCLE SPASMS 60 tablet 1  . metoprolol tartrate (LOPRESSOR) 50 MG tablet Take 1 tablet (50 mg total) by mouth 2 (two) times daily. 180 tablet 3  . omeprazole (PRILOSEC) 20 MG capsule Take 20 mg by mouth daily.      . rivaroxaban (XARELTO) 20 MG TABS tablet TAKE 1 TABLET BY MOUTH ONCE DAILY WITH SUPPER (Patient taking differently: Take 20 mg by mouth daily with supper. TAKE 1 TABLET BY MOUTH ONCE DAILY WITH SUPPER) 90 tablet 3  . TRULICITY 1.5 MG/0.5ML SOPN Inject 1.5 mg as directed every Sunday.   11  . valsartan (DIOVAN) 320 MG tablet Take 320 mg by mouth daily.   3   No current facility-administered medications on file prior to visit.     PAST MEDICAL HISTORY: Past Medical History:  Diagnosis Date  . A-fib (HCC)   . Anemia    hx of  . Arthritis   . Chest pain   . Constipation   . Depression   . Diabetes mellitus ORAL MEDS  . Diabetic retinopathy (HCC)   . Dyspnea    with minimal exertion-deconditioned  . Dyspnea   . Dysrhythmia    a-fib  . Food allergy   . GERD (gastroesophageal  reflux disease)    occasional  . Gout   . History of kidney stones   . Hypercholesteremia   . Hyperlipidemia   . Hypertension   . Hypertensive kidney disease   . Hypothyroidism   . Knee pain, right   . 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 Obesity   . OSA on CPAP    uses CPAP nightly  . Osteoarthritis   . Palpitations   . Pinched nerve in neck   . Pleurisy   . Pneumonia    hx of  . PONV (postoperative nausea and vomiting)    for 3-4 days after general anesthesia  . Sciatica   . Swelling of knee joint, right   . Synovitis of knee RIGHT  . Trigger finger, left    left index  . Vitamin D deficiency     PAST SURGICAL HISTORY: Past Surgical History:  Procedure Laterality Date  . CESAREAN SECTION  X3  . KNEE ARTHROSCOPY  04/20/2011   Procedure: ARTHROSCOPY KNEE;  Surgeon: Loanne DrillingFrank V Aluisio, MD;  [facility];  Service: Orthopedics;  Laterality: Right;  WITH SYNOVECTOMY  . LEFT CARPAL TUNNEL / LEFT MIDDLE & RING FINGER TRIGGER RELEASE  08-26-2008  . LEFT SHOULDER ARTHROSCOPY W/ DEBRIDEMENT  09-09-2003  . LEFT SHOULDER ARTHROSCOPY/ LEFT THUMB TRIGGER RELEASE   02-22-2005  . PHOTOCOAGULATION WITH LASER Right 03/20/2018   Procedure: PHOTOCOAGULATION WITH LASER;  Surgeon: Carmela RimaPatel, Narendra, MD;  [facility];  Service: Ophthalmology;  Laterality: Right;  . PULLEY RELEASE LEFT LONG FINGER  07-14-2009  . RADIAL HEAD ARTHROPLASTY Right 06/17/2018   Procedure: RADIAL HEAD ARTHROPLASTY;  Surgeon: Bjorn PippinVarkey, Dax T, MD;  Location: [facility];  Service: Orthopedics;  Laterality: Right;  . RIGHT CARPAL TUNNEL/ RIGHT THUMB TRIGGER RELEASE'S  11-28-2006  . RIGHT SHOULDER ARTHROSCOPY W/ ROTATOR CUFF REPAIR  01-13-2004  . SHOULDER ARTHROSCOPY DISTAL CLAVICLE EXCISION AND OPEN ROTATOR CUFF REPAIR  09-07-2004   LEFT  . SHOULDER ARTHROSCOPY W/ ACROMIAL REPAIR  11-29-2005   LEFT  . TONSILLECTOMY AND ADENOIDECTOMY  child  . TOTAL KNEE ARTHROPLASTY  12-14-2009   RIGHT  . TOTAL KNEE ARTHROPLASTY Left 11/05/2012   Procedure: LEFT TOTAL KNEE ARTHROPLASTY;  Surgeon: Loanne DrillingFrank V Aluisio, MD;  Location: [facility];  Service: Orthopedics;  Laterality: Left;  . TOTAL KNEE REVISION Right 07/29/2016   Procedure: RIGHT KNEE POLY-LINER EXCHANGE;  Surgeon: Kathryne HitchBlackman, Christopher Y, MD;  Location: [facility];  Service: Orthopedics;  Laterality: Right;  . TRIGGER FINGER RELEASE Right 02/21/2013   Procedure: RIGHT RING A-1 PULLEY RELEASE    (MINOR PROCEDURE) ;  Surgeon: Wyn Forsterobert V Sypher Jr., MD;  [facility] [place];  Service: Orthopedics;  Laterality: Right;  . TRIGGER FINGER RELEASE Left 09/22/2016   Procedure: RELEASE TRIGGER FINGER LEFT INDEX FINGER;  Surgeon: Kathryne HitchBlackman, Christopher Y, MD;  Location: [facility];  Service: Orthopedics;  Laterality: Left;  . TRIGGER FINGER RELEASE Right 01/20/2017   Procedure: RELEASE TRIGGER FINGER/A-1 PULLEY RIGHT INDEX FINGER;  Surgeon: Marcene Corningalldorf, Peter, MD;  Location: [facility];  Service: Orthopedics;  Laterality: Right;  . VITRECTOMY 25 GAUGE WITH SCLERAL BUCKLE Right 03/20/2018   Procedure: RIGHT EYE VITRECTOMY WITH  ENDOLASER PARENTAL  PHOTOCOAGULATION 25 GAUGE;  Surgeon: Carmela RimaPatel, Narendra, MD;  [facility];  Service: Ophthalmology;  Laterality: Right;    SOCIAL HISTORY: Social History   Tobacco Use  . Smoking status: Former Smoker    Years: 5.00    Types: Cigarettes  Quit date: 04/12/1977    Years since quitting: 41.6  . Smokeless tobacco: Never Used  Substance Use Topics  . Alcohol use: No  . Drug use: No    FAMILY HISTORY: Family History  Problem Relation Age of Onset  . Diabetes Mother   . Hypertension Mother   . Heart attack Mother   . Thyroid disease Mother   . Obesity Mother   . Cancer Father   . Liver disease Father   . Sleep apnea Father   . Alcoholism Father     ROS: Review of Systems  Constitutional: Negative for weight loss.  Respiratory: Negative for shortness of breath.   Cardiovascular: Negative for chest pain.  Neurological: Negative for dizziness and headaches.  Endo/Heme/Allergies:       Negative for hypoglycemia    PHYSICAL EXAM: Blood pressure (!) 146/72, pulse 66, temperature 98 F (36.7 C), temperature source Oral, height  (1.549 m), weight 257 lb (116.6 kg), SpO2 95 %. Body mass index is 48.56 kg/m. Physical Exam Vitals signs reviewed.  Constitutional:      Appearance: Normal appearance. She is obese.  HENT:     Head: Normocephalic.     Nose: Nose normal.  Neck:     Musculoskeletal: Normal range of motion.  Cardiovascular:     Rate and Rhythm: Normal rate.  Pulmonary:     Effort: Pulmonary effort is normal.  Musculoskeletal: Normal range of motion.  Skin:    General: Skin is warm and dry.  Neurological:     Mental Status: She is alert and oriented to person, place, and time.  Psychiatric:        Mood and Affect: Mood normal.     RECENT LABS AND TESTS: BMET    Component Value Date/Time   NA 138 06/16/2018 1742   NA 142 11/28/2017 1121   K 3.9 06/16/2018 1742   CL 105 06/16/2018 1742   CO2 23 06/16/2018 1742   GLUCOSE 124 (H) 06/16/2018 1742    BUN 35 (H) 06/16/2018 1742   BUN 25 11/28/2017 1121   CREATININE 1.15 (H) 06/16/2018 1742   CALCIUM 10.3 06/16/2018 1742   GFRNONAA 49 (L) 06/16/2018 1742   GFRAA 56 (L) 06/16/2018 1742   Lab Results  Component Value Date   HGBA1C 7.4 (H) 11/28/2017   HGBA1C 6.8 (H) 07/18/2017   HGBA1C 7.7 (H) 07/20/2016   HGBA1C 7.9 09/18/2014   No results found for: INSULIN CBC    Component Value Date/Time   WBC 12.7 (H) 06/16/2018 1742   RBC 5.20 (H) 06/16/2018 1742   HGB 15.3 (H) 06/16/2018 1742   HGB 13.0 07/18/2017 1112   HCT 50.4 (H) 06/16/2018 1742   HCT 39.6 07/18/2017 1112   PLT 344 06/16/2018 1742   MCV 96.9 06/16/2018 1742   MCV 87 07/18/2017 1112   MCH 29.4 06/16/2018 1742   MCHC 30.4 06/16/2018 1742   RDW 14.3 06/16/2018 1742   RDW 15.3 07/18/2017 1112   LYMPHSABS 2.1 06/16/2018 1742   LYMPHSABS 1.8 07/18/2017 1112   MONOABS 0.9 06/16/2018 1742   EOSABS 0.1 06/16/2018 1742   EOSABS 0.1 07/18/2017 1112   BASOSABS 0.1 06/16/2018 1742   BASOSABS 0.0 07/18/2017 1112   Iron/TIBC/Ferritin/ %Sat No results found for: IRON, TIBC, FERRITIN, IRONPCTSAT Lipid Panel     Component Value Date/Time   CHOL 126 07/18/2017 1112   TRIG 98 07/18/2017 1112   HDL 43 07/18/2017 1112   LDLCALC 63 07/18/2017 1112   Hepatic  Function Panel     Component Value Date/Time   PROT 6.5 11/28/2017 1121   ALBUMIN 3.7 11/28/2017 1121   AST 25 11/28/2017 1121   ALT 24 11/28/2017 1121   ALKPHOS 74 11/28/2017 1121   BILITOT 0.3 11/28/2017 1121      Component Value Date/Time   TSH 3.740 07/18/2017 1112      OBESITY BEHAVIORAL INTERVENTION VISIT  Today's visit was # 14   Starting weight: 265 lbs Starting date: 07/18/17 Today's weight : Weight: 257 lb (116.6 kg)  Today's date:12/06/18 Total lbs lost to date: 8 At least 15 minutes were spent on discussing the following behavioral intervention visit.   ASK: We discussed the diagnosis of obesity with [name] today and [name]  agreed to give [name] permission to discuss obesity behavioral modification therapy today.  ASSESS: Kateena has the diagnosis of obesity and her BMI today is 48.58 [name] is in the action stage of change   ADVISE: Aynslee was educated on the multiple health risks of obesity as well as the benefit of weight loss to improve her health. She was advised of the need for long term treatment and the importance of lifestyle modifications to improve her current health and to decrease her risk of future health problems.  AGREE: Multiple dietary modification options and treatment options were discussed and  Caily agreed to follow the recommendations documented in the above note.  ARRANGE: Rafaela was educated on the importance of frequent visits to treat obesity as outlined per CMS and USPSTF guidelines and agreed to schedule her next follow up appointment today.  I, [name], am acting as transcriptionist for Quillian Quince, MD   I have reviewed the above documentation for accuracy and completeness, and I agree with the above. -Quillian Quince, MD

## 2018-12-12 MED FILL — ATORVASTATIN 40 MG TABLET: 40 | 90 days supply | Qty: 90 | Fill #1

## 2018-12-12 MED FILL — TRULICITY 1.5 MG/0.5 ML PEN: 1.5 | 28 days supply | Qty: 2 | Fill #0

## 2018-12-12 MED FILL — AMLODIPINE BESYLATE 5 MG TA: 5 | 90 days supply | Qty: 90 | Fill #0

## 2018-12-12 MED FILL — XARELTO 20 MG TABLET: 20 | 90 days supply | Qty: 90 | Fill #2

## 2018-12-13 DIAGNOSIS — S52121D Displaced fracture of head of right radius, subsequent encounter for closed fracture with routine healing: Secondary | ICD-10-CM | POA: Diagnosis not present

## 2018-12-13 MED FILL — LEVOTHYROXINE 125 MCG TABLE: 125 | 30 days supply | Qty: 30 | Fill #0

## 2018-12-13 MED FILL — LOSARTAN POTASSIUM 100 MG T: 100 | 30 days supply | Qty: 30 | Fill #4

## 2018-12-20 DIAGNOSIS — M25531 Pain in right wrist: Secondary | ICD-10-CM | POA: Diagnosis not present

## 2018-12-22 DIAGNOSIS — G4733 Obstructive sleep apnea (adult) (pediatric): Secondary | ICD-10-CM | POA: Diagnosis not present

## 2018-12-24 DIAGNOSIS — E113511 Type 2 diabetes mellitus with proliferative diabetic retinopathy with macular edema, right eye: Secondary | ICD-10-CM | POA: Diagnosis not present

## 2018-12-26 ENCOUNTER — Encounter (INDEPENDENT_AMBULATORY_CARE_PROVIDER_SITE_OTHER): Payer: Self-pay

## 2018-12-27 ENCOUNTER — Other Ambulatory Visit: Payer: Self-pay

## 2018-12-27 ENCOUNTER — Ambulatory Visit (INDEPENDENT_AMBULATORY_CARE_PROVIDER_SITE_OTHER): Payer: 59 | Admitting: Family Medicine

## 2018-12-27 ENCOUNTER — Encounter (INDEPENDENT_AMBULATORY_CARE_PROVIDER_SITE_OTHER): Payer: Self-pay | Admitting: Family Medicine

## 2018-12-27 VITALS — BP 131/67 | HR 65 | Temp 98.0°F | Ht 61.0 in | Wt 257.0 lb

## 2018-12-27 DIAGNOSIS — Z9189 Other specified personal risk factors, not elsewhere classified: Secondary | ICD-10-CM | POA: Diagnosis not present

## 2018-12-27 DIAGNOSIS — E119 Type 2 diabetes mellitus without complications: Secondary | ICD-10-CM | POA: Diagnosis not present

## 2018-12-27 DIAGNOSIS — F329 Major depressive disorder, single episode, unspecified: Secondary | ICD-10-CM

## 2018-12-27 DIAGNOSIS — F419 Anxiety disorder, unspecified: Secondary | ICD-10-CM

## 2018-12-27 DIAGNOSIS — F32A Depression, unspecified: Secondary | ICD-10-CM

## 2018-12-27 DIAGNOSIS — Z6841 Body Mass Index (BMI) 40.0 and over, adult: Secondary | ICD-10-CM

## 2018-12-27 DIAGNOSIS — Z794 Long term (current) use of insulin: Secondary | ICD-10-CM

## 2019-01-02 ENCOUNTER — Telehealth (INDEPENDENT_AMBULATORY_CARE_PROVIDER_SITE_OTHER): Payer: Self-pay | Admitting: Psychology

## 2019-01-02 MED FILL — DULOXETINE HCL 60 MG CPEP: 60 | 30 days supply | Qty: 30 | Fill #5

## 2019-01-02 MED FILL — hydrALAZINE HCL 25 MG TABS: 25 | 30 days supply | Qty: 60 | Fill #2

## 2019-01-02 NOTE — Progress Notes (Signed)
Office: [phone]  /  Fax: [phone]   HPI:   Chief Complaint: OBESITY [name] is here to discuss her progress with her obesity treatment plan. She is on the Category 2 plan and is following her eating plan approximately 75 % of the time. She states she is walking for 30 minutes 5 times per week. [name] has done well maintaining her weight. She is struggling with meal planning, especially in the evening and sometimes she is skipping meals.  Her weight is 257 lb (116.6 kg) today and has not lost weight since her last visit. She has lost 8 lbs since starting treatment with us.  Diabetes II [name] has a diagnosis of diabetes type II. [name] states her fasting BGs average between 100 and 130. She is not checking her blood sugars after meals. She did have an episode of hypoglycemia at 2768, but she was symptomatic. She has been working on intensive lifestyle modifications including diet, exercise, and weight loss to help control her blood glucose levels.  Depression with Anxiety [name] is still struggling with increased stress and some emotional eating. She would like to talk with a therapist. [name] struggles with emotional eating and using food for comfort to the extent that it is negatively impacting her health. She often snacks when she is not hungry. [name] sometimes feels she is out of control and then feels guilty that she made poor food choices. She has been working on behavior modification techniques to help reduce her emotional eating and has been somewhat successful. She shows no sign of suicidal or homicidal ideations.  At risk for cardiovascular disease [name] is at a higher than average risk for cardiovascular disease due to obesity. She currently denies any chest pain.  ASSESSMENT AND PLAN:  Type 2 diabetes mellitus without complication, with long-term current use of insulin (HCC)  Anxiety and depression  Class 3 severe obesity with serious comorbidity and body mass index  (BMI) of 45.0 to 49.9 in adult, unspecified obesity type (HCC)  PLAN:  Diabetes II [name] has been given extensive diabetes education by myself today including ideal fasting and post-prandial blood glucose readings, individual ideal Hgb A1c goals and hypoglycemia prevention. We discussed the importance of good blood sugar control to decrease the likelihood of diabetic complications such as nephropathy, neuropathy, limb loss, blindness, coronary artery disease, and death. We discussed the importance of intensive lifestyle modification including diet, exercise and weight loss as the first line treatment for diabetes. [name] agrees to continue her diabetes medications, diet, and exercise. [name] agrees to follow up with our clinic in 2 to 3 weeks.  Depression with Anxiety We discussed behavior modification techniques today to help [name] deal with her emotional eating and depression. We will refer to Dr. Dewaine CongerBarker, our Bariatric Psychologist for evaluation. [name] agrees to follow up with our clinic in 2 to 3 weeks.  Cardiovascular risk counselling [name] was given extended (15 minutes) coronary artery disease prevention counseling today. She is 68 y.o. female and has risk factors for heart disease including obesity. We discussed intensive lifestyle modifications today with an emphasis on specific weight loss instructions and strategies. Pt was also informed of the importance of increasing exercise and decreasing saturated fats to help prevent heart disease. Obesity [name] is currently in the action stage of change. As such, her goal is to continue with weight loss efforts She has agreed to keep a food journal with 350-500 calories and 35+ grams of protein daily at supper daily and follow the Category  2 plan [name] has been instructed to work up to a goal of 150 minutes of combined cardio and strengthening exercise per week for weight loss and overall health benefits. We discussed the following Behavioral  Modification Strategies today: increasing lean protein intake, increasing vegetables, and no skipping meals   [name] has agreed to follow up with our clinic in 2 to 3 weeks. She was informed of the importance of frequent follow up visits to maximize her success with intensive lifestyle modifications for her multiple health conditions.  ALLERGIES: Allergies  Allergen Reactions  . Actos [Pioglitazone Hydrochloride] Swelling and Other (See Comments)    Numbness in lips, HEADACHES SWELLING REACTION UNSPECIFIED   . Avandia [Rosiglitazone] Swelling    Numbness in lips, headaches  SWELLING REACTION UNSPECIFIED   . Tetanus Toxoids Other (See Comments)    Arm swelling, fainted, ?Respiratory distress  (horse serum)  . Food Nausea Only    Eggplant    MEDICATIONS: Current Outpatient Medications on File Prior to Visit  Medication Sig Dispense Refill  . Aflibercept (EYLEA) 2 MG/0.05ML SOLN 2 mg by Intravitreal route every 30 (thirty) days.    [name] amLODipine (NORVASC) 5 MG tablet Take 5 mg by mouth every evening.     [name] atorvastatin (LIPITOR) 40 MG tablet Take 40 mg by mouth every evening.     . calcium carbonate (CALCIUM 600) 600 MG TABS tablet Take 600 mg by mouth daily with breakfast.    . celecoxib (CELEBREX) 100 MG capsule Take 1 capsule (100 mg total) by mouth 2 (two) times daily. 60 capsule 2  . cholecalciferol (VITAMIN D3) 25 MCG (1000 UT) tablet Take 1,000 Units by mouth daily.    . DULoxetine (CYMBALTA) 60 MG capsule Take 60 mg by mouth daily.    . flecainide (TAMBOCOR) 50 MG tablet TAKE 1 TABLET BY MOUTH TWO TIMES DAILY (PLEASE KEEP MARCH APPOINTMENT) 180 tablet 3  . hydrochlorothiazide (MICROZIDE) 12.5 MG capsule Take 12.5 mg by mouth daily.   3  . HYDROcodone-acetaminophen (NORCO/VICODIN) 5-325 MG tablet TAKE 1 TABLET BY MOUTH EVERY 6 HOURS AS NEEDED FOR MODERATE PAIN 30 tablet 0  . insulin glargine (LANTUS) 100 UNIT/ML injection Inject 48 Units into the skin 2 (two) times daily.     [name]  levothyroxine (SYNTHROID, LEVOTHROID) 125 MCG tablet Take 125 mcg by mouth daily before breakfast.     . Melatonin 5 MG CAPS Take 5-10 mg by mouth at bedtime as needed (sleep).     . metFORMIN (GLUCOPHAGE-XR) 500 MG 24 hr tablet Take 1,000 mg by mouth daily with supper.    . methocarbamol (ROBAXIN) 500 MG tablet TAKE 1 TABLET BY MOUTH EVERY 6 HOURS AS NEEDED FOR MUSCLE SPASMS 60 tablet 1  . metoprolol tartrate (LOPRESSOR) 50 MG tablet Take 1 tablet (50 mg total) by mouth 2 (two) times daily. 180 tablet 3  . omeprazole (PRILOSEC) 20 MG capsule Take 20 mg by mouth daily.     . rivaroxaban (XARELTO) 20 MG TABS tablet TAKE 1 TABLET BY MOUTH ONCE DAILY WITH SUPPER (Patient taking differently: Take 20 mg by mouth daily with supper. TAKE 1 TABLET BY MOUTH ONCE DAILY WITH SUPPER) 90 tablet 3  . TRULICITY 1.5 MG/0.5ML SOPN Inject 1.5 mg as directed every Sunday.   11  . valsartan (DIOVAN) 320 MG tablet Take 320 mg by mouth daily.   3   No current facility-administered medications on file prior to visit.     PAST MEDICAL HISTORY: Past Medical History:  Diagnosis Date  . A-fib ([place])   . Anemia    hx of  . Arthritis   . Chest pain   . Constipation   . Depression   . Diabetes mellitus ORAL MEDS  . Diabetic retinopathy ([place])   . Dyspnea    with minimal exertion-deconditioned  . Dyspnea   . Dysrhythmia    a-fib  . Food allergy   . GERD (gastroesophageal reflux disease)    occasional  . Gout   . History of kidney stones   . Hypercholesteremia   . Hyperlipidemia   . Hypertension   . Hypertensive kidney disease   . Hypothyroidism   . Knee pain, right   . 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 Obesity   . OSA on CPAP    uses CPAP nightly  . Osteoarthritis   . Palpitations   . Pinched nerve in neck   . Pleurisy   . Pneumonia    hx of  . PONV (postoperative nausea and vomiting)    for 3-4 days after general anesthesia  . Sciatica   . Swelling of knee joint, right   .  Synovitis of knee RIGHT  . Trigger finger, left    left index  . Vitamin D deficiency     PAST SURGICAL HISTORY: Past Surgical History:  Procedure Laterality Date  . CESAREAN SECTION  X3  . KNEE ARTHROSCOPY  04/20/2011   Procedure: ARTHROSCOPY KNEE;  Surgeon: Gearlean Alf, MD;  Location: [facility];  Service: Orthopedics;  Laterality: Right;  WITH SYNOVECTOMY  . LEFT CARPAL TUNNEL / LEFT MIDDLE & RING FINGER TRIGGER RELEASE  08-26-2008  . LEFT SHOULDER ARTHROSCOPY W/ DEBRIDEMENT  09-09-2003  . LEFT SHOULDER ARTHROSCOPY/ LEFT THUMB TRIGGER RELEASE  02-22-2005  . PHOTOCOAGULATION WITH LASER Right 03/20/2018   Procedure: PHOTOCOAGULATION WITH LASER;  Surgeon: Jalene Mullet, MD;  Location: [place];  Service: Ophthalmology;  Laterality: Right;  . PULLEY RELEASE LEFT LONG FINGER  07-14-2009  . RADIAL HEAD ARTHROPLASTY Right 06/17/2018   Procedure: RADIAL HEAD ARTHROPLASTY;  Surgeon: Hiram Gash, MD;  Location: [place];  Service: Orthopedics;  Laterality: Right;  . RIGHT CARPAL TUNNEL/ RIGHT THUMB TRIGGER RELEASE'S  11-28-2006  . RIGHT SHOULDER ARTHROSCOPY W/ ROTATOR CUFF REPAIR  01-13-2004  . SHOULDER ARTHROSCOPY DISTAL CLAVICLE EXCISION AND OPEN ROTATOR CUFF REPAIR  09-07-2004   LEFT  . SHOULDER ARTHROSCOPY W/ ACROMIAL REPAIR  11-29-2005   LEFT  . TONSILLECTOMY AND ADENOIDECTOMY  child  . TOTAL KNEE ARTHROPLASTY  12-14-2009   RIGHT  . TOTAL KNEE ARTHROPLASTY Left 11/05/2012   Procedure: LEFT TOTAL KNEE ARTHROPLASTY;  Surgeon: Gearlean Alf, MD;  Location: [facility];  Service: Orthopedics;  Laterality: Left;  . TOTAL KNEE REVISION Right 07/29/2016   Procedure: RIGHT KNEE POLY-LINER EXCHANGE;  Surgeon: Mcarthur Rossetti, MD;  Location: [facility];  Service: Orthopedics;  Laterality: Right;  . TRIGGER FINGER RELEASE Right 02/21/2013   Procedure: RIGHT RING A-1 PULLEY RELEASE    (MINOR PROCEDURE) ;  Surgeon: Cammie Sickle., MD;  [facility];  Service:  Orthopedics;  Laterality: Right;  . TRIGGER FINGER RELEASE Left 09/22/2016   Procedure: RELEASE TRIGGER FINGER LEFT INDEX FINGER;  Surgeon: Mcarthur Rossetti, MD;  Location: [employer] [facility];  Service: Orthopedics;  Laterality: Left;  . TRIGGER FINGER RELEASE Right 01/20/2017   Procedure: RELEASE TRIGGER FINGER/A-1 PULLEY RIGHT INDEX FINGER;  Surgeon: Melrose Nakayama, MD;  Location: [facility];  Service: Orthopedics;  Laterality: Right;  . VITRECTOMY 25 GAUGE WITH SCLERAL BUCKLE Right 03/20/2018   Procedure: RIGHT EYE VITRECTOMY WITH  ENDOLASER PARENTAL PHOTOCOAGULATION 25 GAUGE;  Surgeon: Carmela Rima, MD;  Location: [facility];  Service: Ophthalmology;  Laterality: Right;    SOCIAL HISTORY: Social History   Tobacco Use  . Smoking status: Former Smoker    Years: 5.00    Types: Cigarettes    Quit date: 04/12/1977    Years since quitting: 41.7  . Smokeless tobacco: Never Used  Substance Use Topics  . Alcohol use: No  . Drug use: No    FAMILY HISTORY: Family History  Problem Relation Age of Onset  . Diabetes Mother   . Hypertension Mother   . Heart attack Mother   . Thyroid disease Mother   . Obesity Mother   . Cancer Father   . Liver disease Father   . Sleep apnea Father   . Alcoholism Father     ROS: Review of Systems  Constitutional: Negative for weight loss.  Psychiatric/Behavioral: Positive for depression. Negative for suicidal ideas.    PHYSICAL EXAM: Blood pressure 131/67, pulse 65, temperature 98 F (36.7 C), temperature source Oral, height 5\' 1"  (1.549 m), weight 257 lb (116.6 kg), SpO2 95 %. Body mass index is 48.56 kg/m. Physical Exam Vitals signs reviewed.  Constitutional:      Appearance: Normal appearance. She is obese.  Cardiovascular:     Rate and Rhythm: Normal rate.     Pulses: Normal pulses.  Pulmonary:     Effort: Pulmonary effort is normal.     Breath sounds: Normal breath sounds.  Musculoskeletal: Normal range of motion.  Skin:     General: Skin is warm and dry.  Neurological:     Mental Status: She is alert and oriented to person, place, and time.  Psychiatric:        Mood and Affect: Mood normal.        Behavior: Behavior normal.     RECENT LABS AND TESTS: BMET    Component Value Date/Time   NA 138 06/16/2018 1742   NA 142 11/28/2017 1121   K 3.9 06/16/2018 1742   CL 105 06/16/2018 1742   CO2 23 06/16/2018 1742   GLUCOSE 124 (H) 06/16/2018 1742   BUN 35 (H) 06/16/2018 1742   BUN 25 11/28/2017 1121   CREATININE 1.15 (H) 06/16/2018 1742   CALCIUM 10.3 06/16/2018 1742   GFRNONAA 49 (L) 06/16/2018 1742   GFRAA 56 (L) 06/16/2018 1742   Lab Results  Component Value Date   HGBA1C 7.4 (H) 11/28/2017   HGBA1C 6.8 (H) 07/18/2017   HGBA1C 7.7 (H) 07/20/2016   HGBA1C 7.9 09/18/2014   No results found for: INSULIN CBC    Component Value Date/Time   WBC 12.7 (H) 06/16/2018 1742   RBC 5.20 (H) 06/16/2018 1742   HGB 15.3 (H) 06/16/2018 1742   HGB 13.0 07/18/2017 1112   HCT 50.4 (H) 06/16/2018 1742   HCT 39.6 07/18/2017 1112   PLT 344 06/16/2018 1742   MCV 96.9 06/16/2018 1742   MCV 87 07/18/2017 1112   MCH 29.4 06/16/2018 1742   MCHC 30.4 06/16/2018 1742   RDW 14.3 06/16/2018 1742   RDW 15.3 07/18/2017 1112   LYMPHSABS 2.1 06/16/2018 1742   LYMPHSABS 1.8 07/18/2017 1112   MONOABS 0.9 06/16/2018 1742   EOSABS 0.1 06/16/2018 1742   EOSABS 0.1 07/18/2017 1112   BASOSABS 0.1 06/16/2018 1742  BASOSABS 0.0 07/18/2017 1112   Iron/TIBC/Ferritin/ %Sat No results found for: IRON, TIBC, FERRITIN, IRONPCTSAT Lipid Panel     Component Value Date/Time   CHOL 126 07/18/2017 1112   TRIG 98 07/18/2017 1112   HDL 43 07/18/2017 1112   LDLCALC 63 07/18/2017 1112   Hepatic Function Panel     Component Value Date/Time   PROT 6.5 11/28/2017 1121   ALBUMIN 3.7 11/28/2017 1121   AST 25 11/28/2017 1121   ALT 24 11/28/2017 1121   ALKPHOS 74 11/28/2017 1121   BILITOT 0.3 11/28/2017 1121      Component  Value Date/Time   TSH 3.740 07/18/2017 1112      OBESITY BEHAVIORAL INTERVENTION VISIT  Today's visit was # 15   Starting weight: 265 lbs Starting date: 07/18/17 Today's weight : 257 lbs Today's date: 12/27/2018 Total lbs lost to date: 8    ASK: We discussed the diagnosis of obesity with Alinda Deem today and Tashawn agreed to give [name] permission to discuss obesity behavioral modification therapy today.  ASSESS: Alynah has the diagnosis of obesity and her BMI today is 48.58 Kymberlyn is in the action stage of change   ADVISE: Tinlee was educated on the multiple health risks of obesity as well as the benefit of weight loss to improve her health. She was advised of the need for long term treatment and the importance of lifestyle modifications to improve her current health and to decrease her risk of future health problems.  AGREE: Multiple dietary modification options and treatment options were discussed and  Emmersyn agreed to follow the recommendations documented in the above note.  ARRANGE: Merelin was educated on the importance of frequent visits to treat obesity as outlined per CMS and USPSTF guidelines and agreed to schedule her next follow up appointment today.  I, Burt Knack, am acting as transcriptionist for Quillian Quince, MD  I have reviewed the above documentation for accuracy and completeness, and I agree with the above. -Quillian Quince, MD

## 2019-01-02 NOTE — Telephone Encounter (Signed)
  Office: [phone]  /  Fax: [phone]  Date of Call: January 02, 2019 Time of Call: 12:33pm Duration of Call: 4 minutes Provider: Glennie Isle, PsyD  CONTENT: This provider called [name] to discuss consent for treatment. [name] explained her brother is her healthcare power of attorney (POA), which was established "10-12 years" ago. She explained she made him her POA after their father passed away and it was done proactively and not because there were any concerns. As such, she noted she continues to consent for her own medical and mental health treatment at this time. She added, "In fact, I don't usually tell him what's going on."  A brief risk assessment was completed. [name] denied experiencing suicidal and homicidal ideation, plan, or intent.  PLAN:  [name] is scheduled for an appointment on January 17, 2019 at 3:00pm.

## 2019-01-02 NOTE — Telephone Encounter (Signed)
  Office: [phone]  /  Fax: [phone]  Date of Call: January 02, 2019  Time of Call: 11:35am Duration of Call: ~ 1 minute Provider: Glennie Isle, PsyD  CONTENT: [name] returned this provider's call and this provider was informed by front desk staff Amalia Hailey) at 10:29am. [name] requested this provider call her back at her work number. As such, this provider called. A female answered and noted [name] was at lunch and would return in 20-25 minutes. This provider did not share who she was nor the reason for the call; therefore, a message was not left.   PLAN: This provider will call [name] again.

## 2019-01-02 NOTE — Telephone Encounter (Signed)
  Office: [phone]  /  Fax: [phone]  Date of Call: January 02, 2019  Time of Call: 9:50am Provider: Glennie Isle, PsyD  CONTENT: This provider called Horris Latino as she is scheduled for an initial appointment with this provider and her chart indicated she has a legal guardian. A HIPAA compliant voicemail was left requesting a call back.   PLAN: This provider will wait for Giuliana to call back.

## 2019-01-03 NOTE — Progress Notes (Signed)
Office: [phone]  /  Fax: [phone]    Date: January 17, 2019  Time Seen: 3:10pm Duration: 45 minutes Provider: Glennie Isle, PsyD Type of Session: Intake for Individual Therapy  Type of Contact: Face-to-face  Informed Consent for In-Person Services During COVID-19:  During today's appointment, information about the decision to initiate in-person services in light of the STMHD-62 public health crisis was discussed. [name] and this provider agreed to meet in person for some or all future appointments. If there is a resurgence of the pandemic or other health concerns arise, telepsychological services may be initiated and any related concerns will be discussed and an attempt to address them will be made. [name] verbally acknowledged understanding that if necessary, this provider may determine there is a need to initiate telepsychological services for everyone's well-being. [name] expressed understanding she may request to initiate telepsychological services, and that request will be respected as long as it is feasible and clinically appropriate. Regarding telepsychological services, [name] acknowledged she is ultimately responsible for understanding her insurance benefits as it relates to reimbursement of telepsychological services. Moreover, the risks for opting for in-person services was discussed. [name] verbally acknowledged understanding that by coming to the office, she is assuming the risk of exposure to the coronavirus or other public risk, and the risk may increase if [name] travels by public transportation, cab, or [employer]. To obtain in-person services, [name] verbally agreed to taking certain precautions (e.g., screening prior to appointment; universal masking; social distancing of 6 feet; proper hand hygiene; no visitors) set forth by [facility] to keep everyone safe from exposure, sickness, and possible death. This information was shared by front desk staff either at the  time of scheduling and/or during the check-in process. [name] expressed understanding that should she not adhere to these safeguards, it may result in starting/returning to a telepsychological service arrangement and/or the exploration of other options for treatment. [name] acknowledged understanding that [facility] will follow the protocol set forth by [facility] should a patient present with a fever or other symptoms or disclose recent exposure, which will include rescheduling the appointment. Furthermore, [name] acknowledged understanding that precautions may change if additional local, state or federal orders or guidelines are published. This provider also shared that if [name] tests positive for the coronavirus, this provider may be required to notify local health authorities that [name] was in the [facility] clinic. Only minimum information necessary for data collection will be disclosed. This provider will follow [place]'s disclosure policy should this provider or staff test positive for the coronavirus. To avoid handling of paper/writing instruments and increasing likelihood of touching, verbal consent was obtained by [name] during today's appointment prior to proceeding. [name] provided verbal consent to proceed, and acknowledged understanding that by verbally consenting to proceed, she is agreeable to all information noted above.   Informed Consent: The provider's role was explained to [name]. The provider reviewed and discussed issues of confidentiality, privacy, and limits therein (e.g., reporting obligations). In addition to verbal informed consent, written informed consent for psychological services was obtained from [place] prior to the initial intake interview. Written consent included information concerning the practice, financial arrangements, and confidentiality and patients' rights. Since the clinic is not a 24/7 crisis center, mental health emergency  resources were shared, and the provider explained MyChart, e-mail, voicemail, and/or other messaging systems should be utilized only for non-emergency reasons. This provider also explained that information obtained during appointments will be placed in [name]'s  medical record in a confidential manner and relevant information will be shared with other providers at [facility] that she meets with for coordination of care. [name] verbally acknowledged understanding of the aforementioned, and agreed to use mental health emergency resources discussed if needed. Moreover, [name] agreed information may be shared with other [facility] providers as needed for coordination of care. By signing the service agreement document, [name] provided written consent for coordination of care.   Chief Complaint/HPI: [name] was referred by Dr. Dennard Nip due to depression with anxiety. Per the note for the visit with Dr. Dennard Nip on December 27, 2018, "[name] is still struggling with increased stress and some emotional eating. She would like to talk with a therapist. [name] struggles with emotional eating and using food for comfort to the extent that it is negatively impacting her health. She often snacks when she is not hungry. [name] sometimes feels she is out of control and then feels guilty that she made poor food choices. She has been working on behavior modification techniques to help reduce her emotional eating and has been somewhat successful. She shows no sign of suicidal or homicidal ideations." During the initial appointment with Dr. Dennard Nip at [facility] on Jul 18, 2017, [name] reported experiencing the following: significant food cravings issues , snacking frequently in the evenings, frequently eating larger portions than normal , binge eating behaviors, struggling with emotional eating and skipping meals frequently.   During today's appointment, [name] was verbally  administered a questionnaire assessing various behaviors related to emotional eating. [name] endorsed the following: experience food cravings on a regular basis, eat certain foods when you are anxious, stressed, depressed, or your feelings are hurt, use food to help you cope with emotional situations, overeat when you are angry or upset, overeat frequently when you are bored or lonely, not worry about what you eat when you are in a good mood and overeat when you are alone, but eat much less when you are with other people. She shared she craves salty foods, such as potato chips. [name] believes the onset of emotional eating was likely during her teenage years and described the current frequency of emotional eating as "once a week." In addition, [name] denied a history of binge eating. [name] denied a history of restricting food intake, purging and engagement in other compensatory strategies, and has never been diagnosed with an eating disorder. She also denied a history of treatment for emotional eating. Moreover, [name] indicated boredom triggers emotional eating, whereas crafting makes emotional eating better. Furthermore, [name] endorsed other problems of concern. She indicated she experiences depression and feels "stuck in a deep well." She noted she has had a "crappy year." More specifically, she had eye surgery in January, shattered her elbow resulting in surgery in April, and pain from a pinched nerve resulting in an epidural in September. She noted she also needs surgery in her wrist and believes she hurt her right shoulder after moving furniture.   Mental Status Examination:  Appearance: neat Behavior: cooperative Mood: euthymic Affect: mood congruent Speech: normal in rate, volume, and tone Eye Contact: appropriate Psychomotor Activity: appropriate Thought Process: linear, logical, and goal directed  Content/Perceptual Disturbances: denies suicidal and homicidal ideation, plan, and intent and no  hallucinations, delusions, bizarre thinking or behavior reported or observed Orientation: time, person, place and purpose of appointment Cognition/Sensorium: memory, attention, language, and fund of knowledge intact  Insight: good Judgment: good  Family & Psychosocial History:  [name] reported she is not in a relationship and she has two children (ages 87 and 86). She indicated she is currently employed with [employer] in admitting at [employer] shared her highest level of education obtained is "two years of college." Currently, Westlynn's social support system consists of her four brothers, sister, sister in laws, brother in Sports coach, and cousins. Moreover, [name] stated she resides alone.   Medical History:  Past Medical History:  Diagnosis Date   A-fib ([place])    Anemia    hx of   Arthritis    Chest pain    Constipation    Depression    Diabetes mellitus ORAL MEDS   Diabetic retinopathy ([place])    Dyspnea    with minimal exertion-deconditioned   Dyspnea    Dysrhythmia    a-fib   Food allergy    GERD (gastroesophageal reflux disease)    occasional   Gout    History of kidney stones    Hypercholesteremia    Hyperlipidemia    Hypertension    Hypertensive kidney disease    Hypothyroidism    Knee pain, right    Left arm numbness DUE TO CERVICAL PINCHED NERVE   Obesity    OSA on CPAP    uses CPAP nightly   Osteoarthritis    Palpitations    Pinched nerve in neck    Pleurisy    Pneumonia    hx of   PONV (postoperative nausea and vomiting)    for 3-4 days after general anesthesia   Sciatica    Swelling of knee joint, right    Synovitis of knee RIGHT   Trigger finger, left    left index   Vitamin D deficiency    Past Surgical History:  Procedure Laterality Date   CESAREAN SECTION  X3   KNEE ARTHROSCOPY  04/20/2011   Procedure: ARTHROSCOPY KNEE;  Surgeon: Gearlean Alf, MD;  Location: [facility];  Service: Orthopedics;  Laterality: Right;  WITH SYNOVECTOMY   LEFT CARPAL TUNNEL / LEFT MIDDLE & RING FINGER TRIGGER RELEASE  08-26-2008   LEFT SHOULDER ARTHROSCOPY W/ DEBRIDEMENT  09-09-2003   LEFT SHOULDER ARTHROSCOPY/ LEFT THUMB TRIGGER RELEASE  02-22-2005   PHOTOCOAGULATION WITH LASER Right 03/20/2018   Procedure: PHOTOCOAGULATION WITH LASER;  Surgeon: Jalene Mullet, MD;  Location: [facility];  Service: Ophthalmology;  Laterality: Right;   PULLEY RELEASE LEFT LONG FINGER  07-14-2009   RADIAL HEAD ARTHROPLASTY Right 06/17/2018   Procedure: RADIAL HEAD ARTHROPLASTY;  Surgeon: Hiram Gash, MD;  Location: [facility];  Service: Orthopedics;  Laterality: Right;   RIGHT CARPAL TUNNEL/ RIGHT THUMB TRIGGER RELEASE'S  11-28-2006   RIGHT SHOULDER ARTHROSCOPY W/ ROTATOR CUFF REPAIR  01-13-2004   SHOULDER ARTHROSCOPY DISTAL CLAVICLE EXCISION AND OPEN ROTATOR CUFF REPAIR  09-07-2004   LEFT   SHOULDER ARTHROSCOPY W/ ACROMIAL REPAIR  11-29-2005   LEFT   TONSILLECTOMY AND ADENOIDECTOMY  child   TOTAL KNEE ARTHROPLASTY  12-14-2009   RIGHT   TOTAL KNEE ARTHROPLASTY Left 11/05/2012   Procedure: LEFT TOTAL KNEE ARTHROPLASTY;  Surgeon: Gearlean Alf, MD;  Location: [facility];  Service: Orthopedics;  Laterality: Left;   TOTAL KNEE REVISION Right 07/29/2016   Procedure: RIGHT KNEE POLY-LINER EXCHANGE;  Surgeon: Mcarthur Rossetti, MD;  Location: [facility];  Service: Orthopedics;  Laterality: Right;   TRIGGER FINGER RELEASE Right 02/21/2013   Procedure: RIGHT RING A-1 PULLEY RELEASE    (MINOR PROCEDURE) ;  Surgeon:  Cammie Sickle., MD;  Location: [facility];  Service: Orthopedics;  Laterality: Right;   TRIGGER FINGER RELEASE Left 09/22/2016   Procedure: RELEASE TRIGGER FINGER LEFT INDEX FINGER;  Surgeon: Mcarthur Rossetti, MD;  Location: [place];  Service: Orthopedics;  Laterality: Left;   TRIGGER FINGER RELEASE Right 01/20/2017   Procedure: RELEASE TRIGGER FINGER/A-1 PULLEY  RIGHT INDEX FINGER;  Surgeon: Melrose Nakayama, MD;  Location: [facility];  Service: Orthopedics;  Laterality: Right;   VITRECTOMY 25 GAUGE WITH SCLERAL BUCKLE Right 03/20/2018   Procedure: RIGHT EYE VITRECTOMY WITH  ENDOLASER PARENTAL PHOTOCOAGULATION 25 GAUGE;  Surgeon: Jalene Mullet, MD;  Location: [facility];  Service: Ophthalmology;  Laterality: Right;   Current Outpatient Medications on File Prior to Visit  Medication Sig Dispense Refill   Aflibercept (EYLEA) 2 MG/0.05ML SOLN 2 mg by Intravitreal route every 30 (thirty) days.     amLODipine (NORVASC) 5 MG tablet Take 5 mg by mouth every evening.      atorvastatin (LIPITOR) 40 MG tablet Take 40 mg by mouth every evening.      calcium carbonate (CALCIUM 600) 600 MG TABS tablet Take 600 mg by mouth daily with breakfast.     celecoxib (CELEBREX) 100 MG capsule Take 1 capsule (100 mg total) by mouth 2 (two) times daily. 60 capsule 2   cholecalciferol (VITAMIN D3) 25 MCG (1000 UT) tablet Take 1,000 Units by mouth daily.     DULoxetine (CYMBALTA) 60 MG capsule Take 60 mg by mouth daily.     flecainide (TAMBOCOR) 50 MG tablet TAKE 1 TABLET BY MOUTH TWO TIMES DAILY (PLEASE KEEP MARCH APPOINTMENT) 180 tablet 3   hydrochlorothiazide (MICROZIDE) 12.5 MG capsule Take 12.5 mg by mouth daily.   3   HYDROcodone-acetaminophen (NORCO/VICODIN) 5-325 MG tablet TAKE 1 TABLET BY MOUTH EVERY 6 HOURS AS NEEDED FOR MODERATE PAIN 30 tablet 0   insulin glargine (LANTUS) 100 UNIT/ML injection Inject 48 Units into the skin 2 (two) times daily.      levothyroxine (SYNTHROID, LEVOTHROID) 125 MCG tablet Take 125 mcg by mouth daily before breakfast.      Melatonin 5 MG CAPS Take 5-10 mg by mouth at bedtime as needed (sleep).      metFORMIN (GLUCOPHAGE-XR) 500 MG 24 hr tablet Take 1,000 mg by mouth daily with supper.     methocarbamol (ROBAXIN) 500 MG tablet TAKE 1 TABLET BY MOUTH EVERY 6 HOURS AS NEEDED FOR MUSCLE SPASMS 60 tablet 1   metoprolol  tartrate (LOPRESSOR) 50 MG tablet Take 1 tablet (50 mg total) by mouth 2 (two) times daily. 180 tablet 3   omeprazole (PRILOSEC) 20 MG capsule Take 20 mg by mouth daily.      rivaroxaban (XARELTO) 20 MG TABS tablet TAKE 1 TABLET BY MOUTH ONCE DAILY WITH SUPPER (Patient taking differently: Take 20 mg by mouth daily with supper. TAKE 1 TABLET BY MOUTH ONCE DAILY WITH SUPPER) 90 tablet 3   TRULICITY 1.5 TG/6.2IR SOPN Inject 1.5 mg as directed every Sunday.   11   valsartan (DIOVAN) 320 MG tablet Take 320 mg by mouth daily.   3   No current facility-administered medications on file prior to visit.   Neria denied a history of head injuries and loss of consciousness.    Mental Health History: [name] denied a history of therapeutic services. [name] denied a history of hospitalizations for psychiatric concerns, and has never met with a psychiatrist. [name] stated her PCP prescribes Cymbalta and noted she  was first prescribed the medication 10-15 years ago. She is unsure if it is helping; therefore, it was recommended she speak with her prescribing provider. [name] agreed. Danyel denied a family history of mental health related concerns. [name] denied a trauma history, including psychological, physical  and sexual abuse, as well as neglect.   [name] described her typical mood as "feel like I'm in a deep well." Aside from concerns noted above and endorsed on the PHQ-9 and GAD-7, Shenise reported experiencing crying spells; worry thoughts about her health and associated costs; decreased motivation due to pain; and hopelessness about her medical conditions. She clarified the aforementioned does not contribute to suicidal ideation rather she feels she is waiting for "the other shoe to drop" as she continues to experience pain and new medical concerns. She also described experiencing guilt at work due to her current medical conditions preventing her from doing certain tasks. [name] denied current alcohol use. She  denied tobacco use. She denied illicit/recreational substance use. Lorenza also denied caffeine intake. Furthermore, [name] denied experiencing the following: memory concerns, hallucinations and delusions, paranoia, symptoms of mania (e.g., expansive mood, flighty ideas, decreased need for sleep, engagement in risky behaviors), social withdrawal, and panic attacks. She also denied history of and current suicidal ideation, plan, and intent; history of and current homicidal ideation, plan, and intent; and history of and current engagement in self-harm.  The following strengths were reported by [name]: very organized, problem solver at work, and caring. The following strengths were observed by this provider: ability to express thoughts and feelings during the therapeutic session, ability to establish and benefit from a therapeutic relationship, ability to learn and practice coping skills, willingness to work toward established goal(s) with the clinic and ability to engage in reciprocal conversation.  Legal History: Tineka denied a history of legal involvement.   Structured Assessment Results: The Patient Health Questionnaire-9 (PHQ-9) is a self-report measure that assesses symptoms and severity of depression over the course of the last two weeks. Talayah obtained a score of 10 suggesting moderate depression. Danilynn finds the endorsed symptoms to be somewhat difficult. Little interest or pleasure in doing things 1  Feeling down, depressed, or hopeless 2  Trouble falling or staying asleep, or sleeping too much 1  Feeling tired or having little energy 3  Poor appetite or overeating 2  Feeling bad about yourself --- or that you are a failure or have let yourself or your family down 0  Trouble concentrating on things, such as reading the newspaper or watching television 1  Moving or speaking so slowly that other people could have noticed? Or the opposite --- being so fidgety or restless that you have been moving  around a lot more than usual 0  Thoughts that you would be better off dead or hurting yourself in some way 0  PHQ-9 Score 10    The Generalized Anxiety Disorder-7 (GAD-7) is a brief self-report measure that assesses symptoms of anxiety over the course of the last two weeks. Sherra obtained a score of 2 suggesting minimal anxiety. Christen finds the endorsed symptoms to be somewhat difficult. Feeling nervous, anxious, on edge 0  Not being able to stop or control worrying 1  Worrying too much about different things 0  Trouble relaxing 0  Being so restless that it's hard to sit still 0  Becoming easily annoyed or irritable 1  Feeling afraid as if something awful might happen 0  GAD-7 Score 2   Interventions: A chart review was  conducted prior to the clinical intake interview. The PHQ-9 and GAD-7 were verbally administered and a clinical intake interview was completed. In addition, Congetta was verbally administered a Mood and Food questionnaire to assess various behaviors related to emotional eating. Throughout session, empathic reflections and validation was provided. Continuing treatment with this provider was discussed and a treatment goal was established. Notably, Chaselynn was receptive to a referral for longer-term therapeutic services in the future to address ongoing stressors (e.g., pain, medical concerns, retirement planning). Psychoeducation regarding emotional versus physical hunger was provided. Arline was given a handout to utilize between now and the next appointment to increase awareness of hunger patterns and subsequent eating.   Provisional DSM-5 Diagnosis: 311 (F32.8) Other Specified Depressive Disorder, Emotional Eating Behaviors  Plan: Addilyne appears able and willing to participate as evidenced by collaboration on a treatment goal, engagement in reciprocal conversation, and asking questions as needed for clarification. The next appointment will be scheduled in two weeks. The following  treatment goal was established: decrease emotional eating. For the aforementioned goal, Khalila can benefit from biweekly individual therapy sessions that are brief in duration for approximately four to six sessions. The treatment modality will be individual therapeutic services, including an eclectic therapeutic approach utilizing techniques from Cognitive Behavioral Therapy, Patient Centered Therapy, Dialectical Behavior Therapy, Acceptance and Commitment Therapy, Interpersonal Therapy, and Cognitive Restructuring. Therapeutic approach will include various interventions as appropriate, such as validation, support, mindfulness, thought defusion, reframing, psychoeducation, values assessment, and role playing. This provider will regularly review the treatment plan and medical chart to keep informed of status changes. Alfie expressed understanding and agreement with the initial treatment plan of care.

## 2019-01-04 ENCOUNTER — Other Ambulatory Visit (HOSPITAL_COMMUNITY): Payer: Self-pay | Admitting: Orthopedic Surgery

## 2019-01-04 ENCOUNTER — Other Ambulatory Visit: Payer: Self-pay | Admitting: Orthopedic Surgery

## 2019-01-04 DIAGNOSIS — M25531 Pain in right wrist: Secondary | ICD-10-CM

## 2019-01-07 ENCOUNTER — Other Ambulatory Visit (HOSPITAL_COMMUNITY): Payer: Self-pay | Admitting: Orthopedic Surgery

## 2019-01-07 DIAGNOSIS — M25531 Pain in right wrist: Secondary | ICD-10-CM

## 2019-01-10 MED FILL — TRULICITY 1.5 MG/0.5 ML PEN: 1.5 | 28 days supply | Qty: 2 | Fill #1

## 2019-01-14 DIAGNOSIS — M25512 Pain in left shoulder: Secondary | ICD-10-CM | POA: Diagnosis not present

## 2019-01-16 MED FILL — LOSARTAN POTASSIUM 100 MG T: 100 | 30 days supply | Qty: 30 | Fill #5

## 2019-01-16 MED FILL — LEVOTHYROXINE 125 MCG TABLE: 125 | 30 days supply | Qty: 30 | Fill #1

## 2019-01-17 ENCOUNTER — Ambulatory Visit (INDEPENDENT_AMBULATORY_CARE_PROVIDER_SITE_OTHER): Payer: 59 | Admitting: Family Medicine

## 2019-01-17 ENCOUNTER — Ambulatory Visit (INDEPENDENT_AMBULATORY_CARE_PROVIDER_SITE_OTHER): Payer: 59 | Admitting: Psychology

## 2019-01-17 ENCOUNTER — Encounter (INDEPENDENT_AMBULATORY_CARE_PROVIDER_SITE_OTHER): Payer: Self-pay | Admitting: Family Medicine

## 2019-01-17 ENCOUNTER — Other Ambulatory Visit: Payer: Self-pay

## 2019-01-17 VITALS — BP 142/71 | HR 67 | Temp 97.9°F | Ht 61.0 in | Wt 255.0 lb

## 2019-01-17 DIAGNOSIS — Z6841 Body Mass Index (BMI) 40.0 and over, adult: Secondary | ICD-10-CM

## 2019-01-17 DIAGNOSIS — F3289 Other specified depressive episodes: Secondary | ICD-10-CM | POA: Diagnosis not present

## 2019-01-17 DIAGNOSIS — I10 Essential (primary) hypertension: Secondary | ICD-10-CM

## 2019-01-17 MED FILL — HYDROCHLOROTHIAZIDE 12.5 MG: 12.5 | 90 days supply | Qty: 90 | Fill #0

## 2019-01-21 DIAGNOSIS — E11319 Type 2 diabetes mellitus with unspecified diabetic retinopathy without macular edema: Secondary | ICD-10-CM | POA: Diagnosis not present

## 2019-01-21 DIAGNOSIS — Z794 Long term (current) use of insulin: Secondary | ICD-10-CM | POA: Diagnosis not present

## 2019-01-21 DIAGNOSIS — E039 Hypothyroidism, unspecified: Secondary | ICD-10-CM | POA: Diagnosis not present

## 2019-01-21 DIAGNOSIS — Z5181 Encounter for therapeutic drug level monitoring: Secondary | ICD-10-CM | POA: Diagnosis not present

## 2019-01-21 MED FILL — LANTUS 100 UNITS/ML VIAL: 100 | 24 days supply | Qty: 20 | Fill #1

## 2019-01-21 NOTE — Progress Notes (Signed)
Office: [phone]  /  Fax: [phone]    Date: January 31, 2019   Time Seen: 3:03pm Duration: 28 minutes Provider: Lawerance Cruel, Psy.D. Type of Session: Individual Therapy  Type of Contact: Face-to-face  Session Content: [name] is a 68 y.o. female presenting for a follow-up appointment to address the previously established treatment goal of decreasing emotional eating. The session was initiated with the administration of the PHQ-9 and GAD-7, as well as a brief check-in. [name] reported, "I've felt better [emotionally] recently." She provided updates regarding medical concerns. Regarding eating, [name] reflected she does "well" in the morning and lunch with eating; however, indicated dinner is difficult. This was explored and it was reflected she was going a long time without eating between lunch and dinner. As such, she was encouraged to add in a snack; she agreed. Additionally, psychoeducation regarding triggers for emotional eating was provided. [name] was provided a handout, and encouraged to utilize the handout between now and the next appointment to increase awareness of triggers and frequency. [name] agreed. This provider also discussed behavioral strategies for specific triggers, such as placing the utensil down when conversing to avoid mindless eating. [name] was receptive to today's session as evidenced by openness to sharing, responsiveness to feedback, and willingness to explore triggers for emotional eating.  Mental Status Examination:  Appearance: neat Behavior: cooperative Mood: euthymic Affect: mood congruent Speech: normal in rate, volume, and tone Eye Contact: appropriate Psychomotor Activity: appropriate Thought Process: linear, logical, and goal directed  Content/Perceptual Disturbances: no hallucinations, delusions, bizarre thinking or behavior reported or observed and no evidence of suicidal and homicidal ideation, plan, and intent Orientation: time, person, place  and purpose of appointment Cognition/Sensorium: memory, attention, language, and fund of knowledge intact  Insight: good Judgment: good  Structured Assessment Results: The Patient Health Questionnaire-9 (PHQ-9) is a self-report measure that assesses symptoms and severity of depression over the course of the last two weeks. [name] obtained a score of 5 suggesting mild depression. [name] finds the endorsed symptoms to be somewhat difficult. Little interest or pleasure in doing things 1  Feeling down, depressed, or hopeless 0  Trouble falling or staying asleep, or sleeping too much 0  Feeling tired or having little energy 1  Poor appetite or overeating 2  Feeling bad about yourself --- or that you are a failure or have let yourself or your family down 1  Trouble concentrating on things, such as reading the newspaper or watching television 0  Moving or speaking so slowly that other people could have noticed? Or the opposite --- being so fidgety or restless that you have been moving around a lot more than usual 0  Thoughts that you would be better off dead or hurting yourself in some way 0  PHQ-9 Score 5    The Generalized Anxiety Disorder-7 (GAD-7) is a brief self-report measure that assesses symptoms of anxiety over the course of the last two weeks. Aziyah obtained a score of 1 suggesting minimal anxiety. [name] finds the endorsed symptoms to be somewhat difficult. Feeling nervous, anxious, on edge 0  Not being able to stop or control worrying 0  Worrying too much about different things 0  Trouble relaxing 0  Being so restless that it's hard to sit still 0  Becoming easily annoyed or irritable 1  Feeling afraid as if something awful might happen 0  GAD-7 Score 1   Interventions:  Conducted a brief chart review Verbal administration of PHQ-9 and GAD-7 for symptom monitoring Provided empathic  reflections and validation Reviewed content from the previous session Psychoeducation provided  regarding triggers for emotional eating Employed motivational interviewing skills to assess patient's willingness/desire to adhere to recommended medical treatments and assignments Focused on rapport building Employed supportive psychotherapy interventions to facilitate reduced distress, and to improve coping skills with identified stressors  DSM-5 Diagnosis: 311 (F32.8) Other Specified Depressive Disorder, Emotional Eating Behaviors  Treatment Goal & Progress: During the initial appointment with this provider, the following treatment goal was established: decrease emotional eating. Progress is limited, as [name] has just begun treatment with this provider; however, she is receptive to the interaction and interventions and rapport is being established.   Plan: Halen continues to appear able and willing to participate as evidenced by engagement in reciprocal conversation, and asking questions for clarification as appropriate. Per [name]'s schedule, the next appointment will be scheduled in three weeks. The next session will focus on reviewing learned skills, and working towards the established treatment goal.

## 2019-01-21 NOTE — Progress Notes (Signed)
Office: [phone]  /  Fax: [phone]   HPI:   Chief Complaint: OBESITY [name] is here to discuss her progress with her obesity treatment plan. She is on the Category 2 plan and is following her eating plan approximately 65% of the time. She states she is exercising 0 minutes 0 times per week. [name] continues to do well with weight loss on her Category 2 plan. Hunger is mostly controlled, but she is struggling to eat all of her food, especially her dinner protein. Her weight is 255 lb (115.7 kg) today and has had a weight loss of 2 pounds over a period of 3 weeks since her last visit. She has lost 10 lbs since starting treatment with [name].  Hypertension [name] is a 68 y.o. female with hypertension.  [name] denies chest pain, headache, or dizziness. She has had a lot of pain issues in her arms and back recently, which may contribute to her hypertension. She is working weight loss to help control her blood pressure with the goal of decreasing her risk of heart attack and stroke. [name]'s blood pressure is elevated today at 142/71.  ASSESSMENT AND PLAN:  Essential hypertension  Class 3 severe obesity with serious comorbidity and body mass index (BMI) of 45.0 to 49.9 in adult, unspecified obesity type (HCC)  PLAN:  Hypertension We discussed sodium restriction, working on healthy weight loss, and a regular exercise program as the means to achieve improved blood pressure control. [name] agreed with this plan and agreed to follow up as directed. We will continue to monitor her blood pressure as well as her progress with the above lifestyle modifications. [name] will check her blood pressure at home and take medications as prescribed. We will recheck her blood pressure in 2 weeks. She will watch for signs of hypotension as she continues her lifestyle modifications.  I spent > than 50% of the 25 minute visit on counseling as documented in the note.  Obesity [name] is currently  in the action stage of change. As such, her goal is to continue with weight loss efforts. She has agreed to follow the Category 2 plan. [name] were discussed. [name] has been instructed to work up to a goal of 150 minutes of combined cardio and strengthening exercise per week for weight loss and overall health benefits. We discussed the following Behavioral Modification Strategies today: increasing Stefanie protein intake, decreasing simple carbohydrates, work on meal planning and easy cooking plans.  [name] has agreed to follow-up with our clinic in 2-3 weeks. She was informed of the importance of frequent follow-up visits to maximize her success with intensive lifestyle modifications for her multiple health conditions.  ALLERGIES: Allergies  Allergen Reactions  . Actos [Pioglitazone Hydrochloride] Swelling and Other (See Comments)    Numbness in lips, HEADACHES SWELLING REACTION UNSPECIFIED   . Avandia [Rosiglitazone] Swelling    Numbness in lips, headaches  SWELLING REACTION UNSPECIFIED   . Tetanus Toxoids Other (See Comments)    Arm swelling, fainted, ?Respiratory distress  (horse serum)  . Food Nausea Only    Eggplant    MEDICATIONS: Current Outpatient Medications on File Prior to Visit  Medication Sig Dispense Refill  . Aflibercept (EYLEA) 2 MG/0.05ML SOLN 2 mg by Intravitreal route every 30 (thirty) days.    [name]. amLODipine (NORVASC) 5 MG tablet Take 5 mg by mouth every evening.     [name]. atorvastatin (LIPITOR) 40 MG tablet Take 40 mg by mouth every evening.     .[name]  calcium carbonate (CALCIUM 600) 600 MG TABS tablet Take 600 mg by mouth daily with breakfast.    . celecoxib (CELEBREX) 100 MG capsule Take 1 capsule (100 mg total) by mouth 2 (two) times daily. 60 capsule 2  . cholecalciferol (VITAMIN D3) 25 MCG (1000 UT) tablet Take 1,000 Units by mouth daily.    . DULoxetine (CYMBALTA) 60 MG capsule Take 60 mg by mouth daily.    . flecainide (TAMBOCOR) 50 MG tablet TAKE 1  TABLET BY MOUTH TWO TIMES DAILY (PLEASE KEEP MARCH APPOINTMENT) 180 tablet 3  . hydrochlorothiazide (MICROZIDE) 12.5 MG capsule Take 12.5 mg by mouth daily.   3  . HYDROcodone-acetaminophen (NORCO/VICODIN) 5-325 MG tablet TAKE 1 TABLET BY MOUTH EVERY 6 HOURS AS NEEDED FOR MODERATE PAIN 30 tablet 0  . insulin glargine (LANTUS) 100 UNIT/ML injection Inject 48 Units into the skin 2 (two) times daily.     [name] levothyroxine (SYNTHROID, LEVOTHROID) 125 MCG tablet Take 125 mcg by mouth daily before breakfast.     . Melatonin 5 MG CAPS Take 5-10 mg by mouth at bedtime as needed (sleep).     . metFORMIN (GLUCOPHAGE-XR) 500 MG 24 hr tablet Take 1,000 mg by mouth daily with supper.    . methocarbamol (ROBAXIN) 500 MG tablet TAKE 1 TABLET BY MOUTH EVERY 6 HOURS AS NEEDED FOR MUSCLE SPASMS 60 tablet 1  . metoprolol tartrate (LOPRESSOR) 50 MG tablet Take 1 tablet (50 mg total) by mouth 2 (two) times daily. 180 tablet 3  . omeprazole (PRILOSEC) 20 MG capsule Take 20 mg by mouth daily.     . rivaroxaban (XARELTO) 20 MG TABS tablet TAKE 1 TABLET BY MOUTH ONCE DAILY WITH SUPPER (Patient taking differently: Take 20 mg by mouth daily with supper. TAKE 1 TABLET BY MOUTH ONCE DAILY WITH SUPPER) 90 tablet 3  . TRULICITY 1.5 MG/0.5ML SOPN Inject 1.5 mg as directed every Sunday.   11  . valsartan (DIOVAN) 320 MG tablet Take 320 mg by mouth daily.   3   No current facility-administered medications on file prior to visit.     PAST MEDICAL HISTORY: Past Medical History:  Diagnosis Date  . A-fib (HCC)   . Anemia    hx of  . Arthritis   . Chest pain   . Constipation   . Depression   . Diabetes mellitus ORAL MEDS  . Diabetic retinopathy (HCC)   . Dyspnea    with minimal exertion-deconditioned  . Dyspnea   . Dysrhythmia    a-fib  . Food allergy   . GERD (gastroesophageal reflux disease)    occasional  . Gout   . History of kidney stones   . Hypercholesteremia   . Hyperlipidemia   . Hypertension   .  Hypertensive kidney disease   . Hypothyroidism   . Knee pain, right   . 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 Obesity   . OSA on CPAP    uses CPAP nightly  . Osteoarthritis   . Palpitations   . Pinched nerve in neck   . Pleurisy   . Pneumonia    hx of  . PONV (postoperative nausea and vomiting)    for 3-4 days after general anesthesia  . Sciatica   . Swelling of knee joint, right   . Synovitis of knee RIGHT  . Trigger finger, left    left index  . Vitamin D deficiency     PAST SURGICAL HISTORY: Past Surgical History:  Procedure Laterality Date  . CESAREAN SECTION  X3  . KNEE ARTHROSCOPY  04/20/2011   Procedure: ARTHROSCOPY KNEE;  Surgeon: Loanne Drilling, MD;  Location[facility];  Service: Orthopedics;  Laterality: Right;  WITH SYNOVECTOMY  . LEFT CARPAL TUNNEL / LEFT MIDDLE & RING FINGER TRIGGER RELEASE  08-26-2008  . LEFT SHOULDER ARTHROSCOPY W/ DEBRIDEMENT  09-09-2003  . LEFT SHOULDER ARTHROSCOPY/ LEFT THUMB TRIGGER RELEASE  02-22-2005  . PHOTOCOAGULATION WITH LASER Right 03/20/2018   Procedure: PHOTOCOAGULATION WITH LASER;  Surgeon: Carmela Rima, MD;  Location: [facility] OR;  Service: Ophthalmology;  Laterality: Right;  . PULLEY RELEASE LEFT LONG FINGER  07-14-2009  . RADIAL HEAD ARTHROPLASTY Right 06/17/2018   Procedure: RADIAL HEAD ARTHROPLASTY;  Surgeon: Bjorn Pippin, MD;  Location: [facility] OR;  Service: Orthopedics;  Laterality: Right;  . RIGHT CARPAL TUNNEL/ RIGHT THUMB TRIGGER RELEASE'S  11-28-2006  . RIGHT SHOULDER ARTHROSCOPY W/ ROTATOR CUFF REPAIR  01-13-2004  . SHOULDER ARTHROSCOPY DISTAL CLAVICLE EXCISION AND OPEN ROTATOR CUFF REPAIR  09-07-2004   LEFT  . SHOULDER ARTHROSCOPY W/ ACROMIAL REPAIR  11-29-2005   LEFT  . TONSILLECTOMY AND ADENOIDECTOMY  child  . TOTAL KNEE ARTHROPLASTY  12-14-2009   RIGHT  . TOTAL KNEE ARTHROPLASTY Left 11/05/2012   Procedure: LEFT TOTAL KNEE ARTHROPLASTY;  Surgeon: Loanne Drilling, MD;  Location: [facility];   Service: Orthopedics;  Laterality: Left;  . TOTAL KNEE REVISION Right 07/29/2016   Procedure: RIGHT KNEE POLY-LINER EXCHANGE;  Surgeon: Kathryne Hitch, MD;  Location: [facility];  Service: Orthopedics;  Laterality: Right;  . TRIGGER FINGER RELEASE Right 02/21/2013   Procedure: RIGHT RING A-1 PULLEY RELEASE    (MINOR PROCEDURE) ;  Surgeon: Wyn Forster., MD;  Location: [facility];  Service: Orthopedics;  Laterality: Right;  . TRIGGER FINGER RELEASE Left 09/22/2016   Procedure: RELEASE TRIGGER FINGER LEFT INDEX FINGER;  Surgeon: Kathryne Hitch, MD;  Location: [facility] OR;  Service: Orthopedics;  Laterality: Left;  . TRIGGER FINGER RELEASE Right 01/20/2017   Procedure: RELEASE TRIGGER FINGER/A-1 PULLEY RIGHT INDEX FINGER;  Surgeon: Marcene Corning, MD;  Location: [facility];  Service: Orthopedics;  Laterality: Right;  . VITRECTOMY 25 GAUGE WITH SCLERAL BUCKLE Right 03/20/2018   Procedure: RIGHT EYE VITRECTOMY WITH  ENDOLASER PARENTAL PHOTOCOAGULATION 25 GAUGE;  Surgeon: Carmela Rima, MD;  Location: [facility] OR;  Service: Ophthalmology;  Laterality: Right;    SOCIAL HISTORY: Social History   Tobacco Use  . Smoking status: Former Smoker    Years: 5.00    Types: Cigarettes    Quit date: 04/12/1977    Years since quitting: 41.8  . Smokeless tobacco: Never Used  Substance Use Topics  . Alcohol use: No  . Drug use: No    FAMILY HISTORY: Family History  Problem Relation Age of Onset  . Diabetes Mother   . Hypertension Mother   . Heart attack Mother   . Thyroid disease Mother   . Obesity Mother   . Cancer Father   . Liver disease Father   . Sleep apnea Father   . Alcoholism Father    ROS: Review of Systems  Cardiovascular: Negative for chest pain.  Neurological: Negative for dizziness and headaches.   PHYSICAL EXAM: Blood pressure (!) 142/71, pulse 67, temperature 97.9 F (36.6 Stefanie), temperature source Oral, height  (1.549 m), weight 255 lb  (115.7 kg), SpO2 95 %. Body mass index is 48.18 kg/m. Physical Exam Vitals signs reviewed.  Constitutional:      Appearance: Normal appearance. She is obese.  Cardiovascular:     Rate and Rhythm: Normal rate.     Pulses: Normal pulses.  Pulmonary:     Effort: Pulmonary effort is normal.     Breath sounds: Normal breath sounds.  Musculoskeletal: Normal range of motion.  Skin:    General: Skin is warm and dry.  Neurological:     Mental Status: She is alert and oriented to person, place, and time.  Psychiatric:        Behavior: Behavior normal.   RECENT LABS AND TESTS: BMET    Component Value Date/Time   NA 138 06/16/2018 1742   NA 142 11/28/2017 1121   K 3.9 06/16/2018 1742   CL 105 06/16/2018 1742   CO2 23 06/16/2018 1742   GLUCOSE 124 (H) 06/16/2018 1742   BUN 35 (H) 06/16/2018 1742   BUN 25 11/28/2017 1121   CREATININE 1.15 (H) 06/16/2018 1742   CALCIUM 10.3 06/16/2018 1742   GFRNONAA 49 (L) 06/16/2018 1742   GFRAA 56 (L) 06/16/2018 1742   Lab Results  Component Value Date   HGBA1C 7.4 (H) 11/28/2017   HGBA1C 6.8 (H) 07/18/2017   HGBA1C 7.7 (H) 07/20/2016   HGBA1C 7.9 09/18/2014   No results found for: INSULIN CBC    Component Value Date/Time   WBC 12.7 (H) 06/16/2018 1742   RBC 5.20 (H) 06/16/2018 1742   HGB 15.3 (H) 06/16/2018 1742   HGB 13.0 07/18/2017 1112   HCT 50.4 (H) 06/16/2018 1742   HCT 39.6 07/18/2017 1112   PLT 344 06/16/2018 1742   MCV 96.9 06/16/2018 1742   MCV 87 07/18/2017 1112   MCH 29.4 06/16/2018 1742   MCHC 30.4 06/16/2018 1742   RDW 14.3 06/16/2018 1742   RDW 15.3 07/18/2017 1112   LYMPHSABS 2.1 06/16/2018 1742   LYMPHSABS 1.8 07/18/2017 1112   MONOABS 0.9 06/16/2018 1742   EOSABS 0.1 06/16/2018 1742   EOSABS 0.1 07/18/2017 1112   BASOSABS 0.1 06/16/2018 1742   BASOSABS 0.0 07/18/2017 1112   Iron/TIBC/Ferritin/ %Sat No results found for: IRON, TIBC, FERRITIN, IRONPCTSAT Lipid Panel     Component Value Date/Time   CHOL  126 07/18/2017 1112   TRIG 98 07/18/2017 1112   HDL 43 07/18/2017 1112   LDLCALC 63 07/18/2017 1112   Hepatic Function Panel     Component Value Date/Time   PROT 6.5 11/28/2017 1121   ALBUMIN 3.7 11/28/2017 1121   AST 25 11/28/2017 1121   ALT 24 11/28/2017 1121   ALKPHOS 74 11/28/2017 1121   BILITOT 0.3 11/28/2017 1121      Component Value Date/Time   TSH 3.740 07/18/2017 1112   Results for MALACHI, SUDERMAN (MRN [id number]) as of 01/21/2019 10:10  Ref. Range 11/28/2017 11:21  Vitamin D, 25-Hydroxy Latest Ref Range: 30.0 - 100.0 ng/mL 31.4   OBESITY BEHAVIORAL INTERVENTION VISIT  Today's visit was #16  Starting weight: 265 lbs Starting date: 07/18/2017 Today's weight: 255 lbs Today's date: 01/17/2019 Total lbs lost to date: 10    01/17/2019  Height 5\' 1"  (1.549 m)  Weight 255 lb (115.7 kg)  BMI (Calculated) 48.21  BLOOD PRESSURE - SYSTOLIC 142  BLOOD PRESSURE - DIASTOLIC 71   Body Fat % 53.6 %   ASK: We discussed the diagnosis of obesity with today and [name] agreed to give Stefanie Hymen permission to discuss obesity behavioral modification therapy today.  ASSESS: [name] has the diagnosis of  obesity and her BMI today is 48.3. [name] is in the action stage of change.   ADVISE: [name] was educated on the multiple health risks of obesity as well as the benefit of weight loss to improve her health. She was advised of the need for long term treatment and the importance of lifestyle modifications to improve her current health and to decrease her risk of future health problems.  AGREE: Multiple dietary modification options and treatment options were discussed and  Colinda agreed to follow the recommendations documented in the above note.  ARRANGE: [name] was educated on the importance of frequent visits to treat obesity as outlined per CMS and USPSTF guidelines and agreed to schedule her next follow up appointment today.  I, Michaelene Song, am acting as Location manager for  Dennard Nip, MD I have reviewed the above documentation for accuracy and completeness, and I agree with the above. -Dennard Nip, MD

## 2019-01-24 ENCOUNTER — Other Ambulatory Visit: Payer: Self-pay

## 2019-01-24 ENCOUNTER — Ambulatory Visit (HOSPITAL_COMMUNITY)
Admission: RE | Admit: 2019-01-24 | Discharge: 2019-01-24 | Disposition: A | Discharge status: Home / Self Care | Payer: 59 | Source: Ambulatory Visit | Attending: Orthopedic Surgery | Admitting: Orthopedic Surgery

## 2019-01-24 DIAGNOSIS — M25531 Pain in right wrist: Secondary | ICD-10-CM | POA: Diagnosis not present

## 2019-01-24 DIAGNOSIS — M19031 Primary osteoarthritis, right wrist: Secondary | ICD-10-CM | POA: Insufficient documentation

## 2019-01-24 DIAGNOSIS — S63591A Other specified sprain of right wrist, initial encounter: Secondary | ICD-10-CM | POA: Diagnosis not present

## 2019-01-24 MED ORDER — GADOBENATE DIMEGLUMINE 529 MG/ML IV SOLN
5.0000 mL | Freq: Once | INTRAVENOUS | Status: AC | PRN
  Administered 2019-01-24: 0.1 mL via INTRA_ARTICULAR

## 2019-01-24 MED ORDER — SODIUM CHLORIDE (PF) 0.9 % IJ SOLN
10.0000 mL | Freq: Once | INTRAMUSCULAR | Status: AC
  Administered 2019-01-24: 2 mL

## 2019-01-24 MED ORDER — IOHEXOL 180 MG/ML  SOLN
20.0000 mL | Freq: Once | INTRAMUSCULAR | Status: AC | PRN
  Administered 2019-01-24: 2 mL

## 2019-01-24 MED ORDER — LIDOCAINE HCL (PF) 1 % IJ SOLN
5.0000 mL | Freq: Once | INTRAMUSCULAR | Status: AC
  Administered 2019-01-24: 3 mL via INTRADERMAL

## 2019-01-25 MED FILL — LEVOTHYROXINE 137 MCG TABLE: 137 | 90 days supply | Qty: 90 | Fill #0

## 2019-01-25 MED FILL — TRULICITY 3 MG/0.5ML SOPN: 3 | 28 days supply | Qty: 2 | Fill #0

## 2019-01-28 ENCOUNTER — Other Ambulatory Visit (INDEPENDENT_AMBULATORY_CARE_PROVIDER_SITE_OTHER): Payer: Self-pay | Admitting: Orthopaedic Surgery

## 2019-01-28 MED FILL — METHOCARBAMOL 500 MG TABS: 500 | 15 days supply | Qty: 60 | Fill #1

## 2019-01-28 MED FILL — HYDROCODON-APAP 5-325: 5-325 | 8 days supply | Qty: 30 | Fill #0

## 2019-01-28 NOTE — Telephone Encounter (Signed)
Please advise 

## 2019-01-29 DIAGNOSIS — M24131 Other articular cartilage disorders, right wrist: Secondary | ICD-10-CM | POA: Diagnosis not present

## 2019-01-31 ENCOUNTER — Other Ambulatory Visit: Payer: Self-pay

## 2019-01-31 ENCOUNTER — Ambulatory Visit (INDEPENDENT_AMBULATORY_CARE_PROVIDER_SITE_OTHER): Payer: 59 | Admitting: Psychology

## 2019-01-31 DIAGNOSIS — F3289 Other specified depressive episodes: Secondary | ICD-10-CM | POA: Diagnosis not present

## 2019-02-04 DIAGNOSIS — E113511 Type 2 diabetes mellitus with proliferative diabetic retinopathy with macular edema, right eye: Secondary | ICD-10-CM | POA: Diagnosis not present

## 2019-02-04 MED FILL — METOPROLOL TARTRATE 50 MG T: 50 | 90 days supply | Qty: 180 | Fill #2

## 2019-02-04 MED FILL — DULOXETINE HCL 60 MG CPEP: 60 | 30 days supply | Qty: 30 | Fill #6

## 2019-02-04 MED FILL — OMEPRAZOLE 20 MG CAP: 20 | 90 days supply | Qty: 90 | Fill #1

## 2019-02-04 MED FILL — hydrALAZINE HCL 25 MG TABS: 25 | 90 days supply | Qty: 180 | Fill #0

## 2019-02-05 DIAGNOSIS — Z760 Encounter for issue of repeat prescription: Secondary | ICD-10-CM | POA: Diagnosis not present

## 2019-02-06 MED FILL — LANTUS 100 UNITS/ML VIAL: 100 | 90 days supply | Qty: 90 | Fill #0

## 2019-02-11 DIAGNOSIS — M542 Cervicalgia: Secondary | ICD-10-CM | POA: Diagnosis not present

## 2019-02-11 MED FILL — METFORMIN HCL ER 500 MG TB2: 500 | 90 days supply | Qty: 180 | Fill #1

## 2019-02-11 NOTE — Progress Notes (Signed)
Office: [phone]  /  Fax: [phone]    Date: February 20, 2019   Time Seen: 10:32am Duration: 29 minutes Provider: Lawerance Cruel, Psy.D. Type of Session: Individual Therapy  Type of Contact: Face-to-face  Session Content: [name] is a 68 y.o. female presenting for a follow-up appointment to address the previously established treatment goal of decreasing emotional eating. The session was initiated with the administration of the PHQ-9 and GAD-7, as well as a brief check-in. [name] shared about recent events. [name] reported she is focusing on making better choices and engaging in portion control. Triggers for emotional eating were reviewed. Psychoeducation regarding pleasurable activities, including its impact on emotional eating and overall well-being was provided. [name] was provided with a handout with various options of pleasurable activities, and was encouraged to engage in one activity a day and additional activities as needed when triggered to emotionally eat. [name] agreed. [name] was receptive to today's appointment as evidenced by openness to sharing, responsiveness to feedback, and willingness to engage in pleasurable activities to assist with coping.  Mental Status Examination:  Appearance: appropriate grooming and hygiene and casually dressed Behavior: appropriate to circumstances Mood: euthymic Affect: mood congruent Speech: normal in rate, volume, and tone Eye Contact: appropriate Psychomotor Activity: appropriate Gait: normal Thought Process: linear, logical, and goal directed  Thought Content/Perception: no hallucinations, delusions, bizarre thinking or behavior reported or observed and no evidence of suicidal and homicidal ideation, plan, and intent Orientation: time, person, place and purpose of appointment Memory/Concentration: memory, attention, language, and fund of knowledge intact  Insight/Judgment: good  Structured Assessments Results: The Patient Health  Questionnaire-9 (PHQ-9) is a self-report measure that assesses symptoms and severity of depression over the course of the last two weeks. [name] obtained a score of 2 suggesting minimal depression. [name] finds the endorsed symptoms to be not difficult at all. [0= Not at all; 1= Several days; 2= More than half the days; 3= Nearly every day] Little interest or pleasure in doing things 0  Feeling down, depressed, or hopeless 1  Trouble falling or staying asleep, or sleeping too much 0  Feeling tired or having little energy 0  Poor appetite or overeating 0  Feeling bad about yourself --- or that you are a failure or have let yourself or your family down 0  Trouble concentrating on things, such as reading the newspaper or watching television 1  Moving or speaking so slowly that other people could have noticed? Or the opposite --- being so fidgety or restless that you have been moving around a lot more than usual 0  Thoughts that you would be better off dead or hurting yourself in some way 0  PHQ-9 Score 2    The Generalized Anxiety Disorder-7 (GAD-7) is a brief self-report measure that assesses symptoms of anxiety over the course of the last two weeks. [name] obtained a score of 0. [0= Not at all; 1= Several days; 2= Over half the days; 3= Nearly every day] Feeling nervous, anxious, on edge 0  Not being able to stop or control worrying 0  Worrying too much about different things 0  Trouble relaxing 0  Being so restless that it's hard to sit still 0  Becoming easily annoyed or irritable 0  Feeling afraid as if something awful might happen 0  GAD-7 Score 0   Interventions:  Conducted a brief chart review Verbally administered PHQ-9 and GAD-7 for symptom monitoring Provided empathic reflections and validation Reviewed content from the previous session Employed supportive psychotherapy  interventions to facilitate reduced distress and to improve coping skills with identified stressors Employed  motivational interviewing skills to assess patient's willingness/desire to adhere to recommended medical treatments and assignments Psychoeducation provided regarding pleasurable activities  DSM-5 Diagnosis: 311 (F32.8) Other Specified Depressive Disorder, Emotional Eating Behaviors  Treatment Goal & Progress: During the initial appointment with this provider, the following treatment goal was established: decrease emotional eating. [name] has demonstrated progress in her goal as evidenced by increased awareness of hunger patterns and triggers for emotional eating. [name] also demonstrates willingness to engage in pleasurable activities.  Plan: [name] declined future appointments with this provider. She noted, "I feel pretty good about things." She acknowledged understanding that she may request a follow-up appointment with this provider in the future as long as she is still established with the clinic. No further follow-up planned by this provider.

## 2019-02-12 ENCOUNTER — Other Ambulatory Visit: Payer: Self-pay | Admitting: Orthopaedic Surgery

## 2019-02-12 ENCOUNTER — Other Ambulatory Visit (HOSPITAL_COMMUNITY): Payer: Self-pay | Admitting: Orthopaedic Surgery

## 2019-02-12 DIAGNOSIS — M542 Cervicalgia: Secondary | ICD-10-CM

## 2019-02-14 ENCOUNTER — Encounter (INDEPENDENT_AMBULATORY_CARE_PROVIDER_SITE_OTHER): Payer: Self-pay | Admitting: Family Medicine

## 2019-02-14 ENCOUNTER — Ambulatory Visit (INDEPENDENT_AMBULATORY_CARE_PROVIDER_SITE_OTHER): Payer: 59 | Admitting: Family Medicine

## 2019-02-14 ENCOUNTER — Other Ambulatory Visit: Payer: Self-pay

## 2019-02-14 VITALS — BP 146/72 | HR 71 | Temp 98.0°F | Ht 61.0 in | Wt 254.0 lb

## 2019-02-14 DIAGNOSIS — I10 Essential (primary) hypertension: Secondary | ICD-10-CM | POA: Diagnosis not present

## 2019-02-14 DIAGNOSIS — Z6841 Body Mass Index (BMI) 40.0 and over, adult: Secondary | ICD-10-CM | POA: Diagnosis not present

## 2019-02-17 NOTE — Progress Notes (Signed)
Office: [phone]  /  Fax: [phone]   HPI:   Chief Complaint: OBESITY [name] is here to discuss her progress with her obesity treatment plan. She is on the Category 2 plan and is following her eating plan approximately 50 % of the time. She states she is walking more and mindful of steps. [name] is doing well with weight loss and is down 1 lb since her last visit. She still notes decrease hunger and struggles to eat her dinner, but tries to eat some protein at night.  Her weight is 254 lb (115.2 kg) today and has had a weight loss of 1 pound over a period of 4 weeks since her last visit. She has lost 11 lbs since starting treatment with [name].  Hypertension ALVERTA CACCAMO is a 68 y.o. female with hypertension. [name]'s blood pressure is elevated again today, but she states it is normal at her other physician offices. She denies chest pain. She is working on weight loss to help control her blood pressure with the goal of decreasing her risk of heart attack and stroke.   ASSESSMENT AND PLAN:  Essential hypertension  Class 3 severe obesity with serious comorbidity and body mass index (BMI) of 45.0 to 49.9 in adult, unspecified obesity type ([place])  PLAN:  Hypertension [name] is working on healthy weight loss, diet, and exercise to improve blood pressure control. [name] agreed with this plan and agreed to follow up as directed. We will continue to monitor her blood pressure as well as her progress with the above lifestyle modifications. [name] agrees to continue her medications and will watch for signs of hypotension as she continues her lifestyle modifications. [name] agrees to follow up with our clinic in 2 to 3 weeks.  I spent > than 50% of the 22 minute visit on counseling as documented in the note.  Obesity [name] is currently in the action stage of change. As such, her goal is to continue with weight loss efforts She has agreed to follow the Category 2 plan [name] has been  instructed to work up to a goal of 150 minutes of combined cardio and strengthening exercise per week for weight loss and overall health benefits. We discussed the following Behavioral Modification Strategies today: no skipping meals   [name] has agreed to follow up with our clinic in 2 to 3 weeks. She was informed of the importance of frequent follow up visits to maximize her success with intensive lifestyle modifications for her multiple health conditions.  ALLERGIES: Allergies  Allergen Reactions  . Actos [Pioglitazone Hydrochloride] Swelling and Other (See Comments)    Numbness in lips, HEADACHES SWELLING REACTION UNSPECIFIED   . Avandia [Rosiglitazone] Swelling    Numbness in lips, headaches  SWELLING REACTION UNSPECIFIED   . Tetanus Toxoids Other (See Comments)    Arm swelling, fainted, ?Respiratory distress  (horse serum)  . Food Nausea Only    Eggplant    MEDICATIONS: Current Outpatient Medications on File Prior to Visit  Medication Sig Dispense Refill  . Aflibercept (EYLEA) 2 MG/0.05ML SOLN 2 mg by Intravitreal route every 30 (thirty) days.    [name] amLODipine (NORVASC) 5 MG tablet Take 5 mg by mouth every evening.     [name] atorvastatin (LIPITOR) 40 MG tablet Take 40 mg by mouth every evening.     . calcium carbonate (CALCIUM 600) 600 MG TABS tablet Take 600 mg by mouth daily with breakfast.    . celecoxib (CELEBREX) 100 MG capsule Take 1 capsule (100  mg total) by mouth 2 (two) times daily. 60 capsule 2  . cholecalciferol (VITAMIN D3) 25 MCG (1000 UT) tablet Take 1,000 Units by mouth daily.    . DULoxetine (CYMBALTA) 60 MG capsule Take 60 mg by mouth daily.    . flecainide (TAMBOCOR) 50 MG tablet TAKE 1 TABLET BY MOUTH TWO TIMES DAILY (PLEASE KEEP MARCH APPOINTMENT) 180 tablet 3  . hydrochlorothiazide (MICROZIDE) 12.5 MG capsule Take 12.5 mg by mouth daily.   3  . HYDROcodone-acetaminophen (NORCO/VICODIN) 5-325 MG tablet TAKE 1 TABLET BY MOUTH EVERY 6 HOURS AS NEEDED FOR MODERATE  PAIN 30 tablet 0  . insulin glargine (LANTUS) 100 UNIT/ML injection Inject 48 Units into the skin 2 (two) times daily.     . Levothyroxine Sodium 137 MCG CAPS Take 137 mcg by mouth daily before breakfast.     . Melatonin 5 MG CAPS Take 5-10 mg by mouth at bedtime as needed (sleep).     . metFORMIN (GLUCOPHAGE-XR) 500 MG 24 hr tablet Take 1,000 mg by mouth daily with supper.    . methocarbamol (ROBAXIN) 500 MG tablet TAKE 1 TABLET BY MOUTH EVERY 6 HOURS AS NEEDED FOR MUSCLE SPASMS 60 tablet 1  . metoprolol tartrate (LOPRESSOR) 50 MG tablet Take 1 tablet (50 mg total) by mouth 2 (two) times daily. 180 tablet 3  . omeprazole (PRILOSEC) 20 MG capsule Take 20 mg by mouth daily.     . rivaroxaban (XARELTO) 20 MG TABS tablet TAKE 1 TABLET BY MOUTH ONCE DAILY WITH SUPPER (Patient taking differently: Take 20 mg by mouth daily with supper. TAKE 1 TABLET BY MOUTH ONCE DAILY WITH SUPPER) 90 tablet 3  . TRULICITY 3 MG/0.5ML SOPN Inject 3 mg as directed every Sunday.   11  . valsartan (DIOVAN) 320 MG tablet Take 320 mg by mouth daily.   3   No current facility-administered medications on file prior to visit.     PAST MEDICAL HISTORY: Past Medical History:  Diagnosis Date  . A-fib (HCC)   . Anemia    hx of  . Arthritis   . Chest pain   . Constipation   . Depression   . Diabetes mellitus ORAL MEDS  . Diabetic retinopathy (HCC)   . Dyspnea    with minimal exertion-deconditioned  . Dyspnea   . Dysrhythmia    a-fib  . Food allergy   . GERD (gastroesophageal reflux disease)    occasional  . Gout   . History of kidney stones   . Hypercholesteremia   . Hyperlipidemia   . Hypertension   . Hypertensive kidney disease   . Hypothyroidism   . Knee pain, right   . 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 Obesity   . OSA on CPAP    uses CPAP nightly  . Osteoarthritis   . Palpitations   . Pinched nerve in neck   . Pleurisy   . Pneumonia    hx of  . PONV (postoperative nausea and  vomiting)    for 3-4 days after general anesthesia  . Sciatica   . Swelling of knee joint, right   . Synovitis of knee RIGHT  . Trigger finger, left    left index  . Vitamin D deficiency     PAST SURGICAL HISTORY: Past Surgical History:  Procedure Laterality Date  . CESAREAN SECTION  X3  . KNEE ARTHROSCOPY  04/20/2011   Procedure: ARTHROSCOPY KNEE;  Surgeon: Loanne Drilling, MD;  Location: [facility];  Service: Orthopedics;  Laterality: Right;  WITH SYNOVECTOMY  . LEFT CARPAL TUNNEL / LEFT MIDDLE & RING FINGER TRIGGER RELEASE  08-26-2008  . LEFT SHOULDER ARTHROSCOPY W/ DEBRIDEMENT  09-09-2003  . LEFT SHOULDER ARTHROSCOPY/ LEFT THUMB TRIGGER RELEASE  02-22-2005  . PHOTOCOAGULATION WITH LASER Right 03/20/2018   Procedure: PHOTOCOAGULATION WITH LASER;  Surgeon: Carmela RimaPatel, Narendra, MD;  [facility];  Service: Ophthalmology;  Laterality: Right;  . PULLEY RELEASE LEFT LONG FINGER  07-14-2009  . RADIAL HEAD ARTHROPLASTY Right 06/17/2018   Procedure: RADIAL HEAD ARTHROPLASTY;  Surgeon: Bjorn PippinVarkey, Dax T, MD;  Location: [facility];  Service: Orthopedics;  Laterality: Right;  . RIGHT CARPAL TUNNEL/ RIGHT THUMB TRIGGER RELEASE'S  11-28-2006  . RIGHT SHOULDER ARTHROSCOPY W/ ROTATOR CUFF REPAIR  01-13-2004  . SHOULDER ARTHROSCOPY DISTAL CLAVICLE EXCISION AND OPEN ROTATOR CUFF REPAIR  09-07-2004   LEFT  . SHOULDER ARTHROSCOPY W/ ACROMIAL REPAIR  11-29-2005   LEFT  . TONSILLECTOMY AND ADENOIDECTOMY  child  . TOTAL KNEE ARTHROPLASTY  12-14-2009   RIGHT  . TOTAL KNEE ARTHROPLASTY Left 11/05/2012   Procedure: LEFT TOTAL KNEE ARTHROPLASTY;  Surgeon: Loanne DrillingFrank V Aluisio, MD;  Location: [facility];  Service: Orthopedics;  Laterality: Left;  . TOTAL KNEE REVISION Right 07/29/2016   Procedure: RIGHT KNEE POLY-LINER EXCHANGE;  Surgeon: Kathryne HitchBlackman, Christopher Y, MD;  Location: [facility];  Service: Orthopedics;  Laterality: Right;  . TRIGGER FINGER RELEASE Right 02/21/2013   Procedure: RIGHT RING A-1 PULLEY RELEASE     (MINOR PROCEDURE) ;  Surgeon: Wyn Forsterobert V Sypher Jr., MD;  [facility];  Service: Orthopedics;  Laterality: Right;  . TRIGGER FINGER RELEASE Left 09/22/2016   Procedure: RELEASE TRIGGER FINGER LEFT INDEX FINGER;  Surgeon: Kathryne HitchBlackman, Christopher Y, MD;  Location: [facility];  Service: Orthopedics;  Laterality: Left;  . TRIGGER FINGER RELEASE Right 01/20/2017   Procedure: RELEASE TRIGGER FINGER/A-1 PULLEY RIGHT INDEX FINGER;  Surgeon: Marcene Corningalldorf, Peter, MD;  Location: [facility];  Service: Orthopedics;  Laterality: Right;  . VITRECTOMY 25 GAUGE WITH SCLERAL BUCKLE Right 03/20/2018   Procedure: RIGHT EYE VITRECTOMY WITH  ENDOLASER PARENTAL PHOTOCOAGULATION 25 GAUGE;  Surgeon: Carmela RimaPatel, Narendra, MD;  [facility];  Service: Ophthalmology;  Laterality: Right;    SOCIAL HISTORY: Social History   Tobacco Use  . Smoking status: Former Smoker    Years: 5.00    Types: Cigarettes    Quit date: 04/12/1977    Years since quitting: 41.8  . Smokeless tobacco: Never Used  Substance Use Topics  . Alcohol use: No  . Drug use: No    FAMILY HISTORY: Family History  Problem Relation Age of Onset  . Diabetes Mother   . Hypertension Mother   . Heart attack Mother   . Thyroid disease Mother   . Obesity Mother   . Cancer Father   . Liver disease Father   . Sleep apnea Father   . Alcoholism Father     ROS: Review of Systems  Constitutional: Positive for weight loss.  Cardiovascular: Negative for chest pain.    PHYSICAL EXAM: Blood pressure (!) 146/72, pulse 71, temperature 98 F (36.7 C), temperature source Oral, height 5\' 1"  (1.549 m), weight 254 lb (115.2 kg), SpO2 95 %. Body mass index is 47.99 kg/m. Physical Exam Vitals signs reviewed.  Constitutional:      Appearance: Normal appearance. She is obese.  Cardiovascular:     Rate and Rhythm: Normal rate.     Pulses: Normal pulses.  Pulmonary:     Effort: Pulmonary effort is normal.     Breath sounds: Normal  breath sounds.  Musculoskeletal: Normal range of motion.  Skin:    General: Skin is warm and dry.  Neurological:     Mental Status: She is alert and oriented to person, place, and time.  Psychiatric:        Mood and Affect: Mood normal.        Behavior: Behavior normal.     RECENT LABS AND TESTS: BMET    Component Value Date/Time   NA 138 06/16/2018 1742   NA 142 11/28/2017 1121   K 3.9 06/16/2018 1742   CL 105 06/16/2018 1742   CO2 23 06/16/2018 1742   GLUCOSE 124 (H) 06/16/2018 1742   BUN 35 (H) 06/16/2018 1742   BUN 25 11/28/2017 1121   CREATININE 1.15 (H) 06/16/2018 1742   CALCIUM 10.3 06/16/2018 1742   GFRNONAA 49 (L) 06/16/2018 1742   GFRAA 56 (L) 06/16/2018 1742   Lab Results  Component Value Date   HGBA1C 7.4 (H) 11/28/2017   HGBA1C 6.8 (H) 07/18/2017   HGBA1C 7.7 (H) 07/20/2016   HGBA1C 7.9 09/18/2014   No results found for: INSULIN CBC    Component Value Date/Time   WBC 12.7 (H) 06/16/2018 1742   RBC 5.20 (H) 06/16/2018 1742   HGB 15.3 (H) 06/16/2018 1742   HGB 13.0 07/18/2017 1112   HCT 50.4 (H) 06/16/2018 1742   HCT 39.6 07/18/2017 1112   PLT 344 06/16/2018 1742   MCV 96.9 06/16/2018 1742   MCV 87 07/18/2017 1112   MCH 29.4 06/16/2018 1742   MCHC 30.4 06/16/2018 1742   RDW 14.3 06/16/2018 1742   RDW 15.3 07/18/2017 1112   LYMPHSABS 2.1 06/16/2018 1742   LYMPHSABS 1.8 07/18/2017 1112   MONOABS 0.9 06/16/2018 1742   EOSABS 0.1 06/16/2018 1742   EOSABS 0.1 07/18/2017 1112   BASOSABS 0.1 06/16/2018 1742   BASOSABS 0.0 07/18/2017 1112   Iron/TIBC/Ferritin/ %Sat No results found for: IRON, TIBC, FERRITIN, IRONPCTSAT Lipid Panel     Component Value Date/Time   CHOL 126 07/18/2017 1112   TRIG 98 07/18/2017 1112   HDL 43 07/18/2017 1112   LDLCALC 63 07/18/2017 1112   Hepatic Function Panel     Component Value Date/Time   PROT 6.5 11/28/2017 1121   ALBUMIN 3.7 11/28/2017 1121   AST 25 11/28/2017 1121   ALT 24 11/28/2017 1121   ALKPHOS 74  11/28/2017 1121   BILITOT 0.3 11/28/2017 1121      Component Value Date/Time   TSH 3.740 07/18/2017 1112      OBESITY BEHAVIORAL INTERVENTION VISIT  Today's visit was # 17   Starting weight: 265 lbs Starting date: 07/18/17 Today's weight : 254 lbs  Today's date: 02/14/2019 Total lbs lost to date: 11    ASK: We discussed the diagnosis of obesity with Alinda Deem today and Marieke agreed to give [name] permission to discuss obesity behavioral modification therapy today.  ASSESS: [name] has the diagnosis of obesity and her BMI today is 48.02 [name] is in the action stage of change   ADVISE: [name] was educated on the multiple health risks of obesity as well as the benefit of weight loss to improve her health. She was advised of the need for long term treatment and the importance of lifestyle modifications to improve her current health and to decrease her risk of future health problems.  AGREE: Multiple dietary modification options and  treatment options were discussed and  [name] agreed to follow the recommendations documented in the above note.  ARRANGE: [name] was educated on the importance of frequent visits to treat obesity as outlined per CMS and USPSTF guidelines and agreed to schedule her next follow up appointment today.  I, Burt Knack, am acting as transcriptionist for Quillian Quince, MD  I have reviewed the above documentation for accuracy and completeness, and I agree with the above. -Quillian Quince, MD

## 2019-02-19 DIAGNOSIS — G4733 Obstructive sleep apnea (adult) (pediatric): Secondary | ICD-10-CM | POA: Diagnosis not present

## 2019-02-19 DIAGNOSIS — I10 Essential (primary) hypertension: Secondary | ICD-10-CM | POA: Diagnosis not present

## 2019-02-19 DIAGNOSIS — Z8639 Personal history of other endocrine, nutritional and metabolic disease: Secondary | ICD-10-CM | POA: Diagnosis not present

## 2019-02-19 DIAGNOSIS — D6869 Other thrombophilia: Secondary | ICD-10-CM | POA: Diagnosis not present

## 2019-02-19 DIAGNOSIS — E039 Hypothyroidism, unspecified: Secondary | ICD-10-CM | POA: Diagnosis not present

## 2019-02-19 DIAGNOSIS — Z794 Long term (current) use of insulin: Secondary | ICD-10-CM | POA: Diagnosis not present

## 2019-02-19 DIAGNOSIS — Z5181 Encounter for therapeutic drug level monitoring: Secondary | ICD-10-CM | POA: Diagnosis not present

## 2019-02-19 DIAGNOSIS — Z0000 Encounter for general adult medical examination without abnormal findings: Secondary | ICD-10-CM | POA: Diagnosis not present

## 2019-02-19 DIAGNOSIS — E11319 Type 2 diabetes mellitus with unspecified diabetic retinopathy without macular edema: Secondary | ICD-10-CM | POA: Diagnosis not present

## 2019-02-19 DIAGNOSIS — I4891 Unspecified atrial fibrillation: Secondary | ICD-10-CM | POA: Diagnosis not present

## 2019-02-20 ENCOUNTER — Ambulatory Visit (INDEPENDENT_AMBULATORY_CARE_PROVIDER_SITE_OTHER): Payer: 59 | Admitting: Psychology

## 2019-02-20 ENCOUNTER — Other Ambulatory Visit: Payer: Self-pay

## 2019-02-20 ENCOUNTER — Ambulatory Visit (HOSPITAL_COMMUNITY)
Admission: RE | Admit: 2019-02-20 | Discharge: 2019-02-20 | Disposition: A | Discharge status: Home / Self Care | Payer: 59 | Source: Ambulatory Visit | Attending: Orthopaedic Surgery | Admitting: Orthopaedic Surgery

## 2019-02-20 DIAGNOSIS — M542 Cervicalgia: Secondary | ICD-10-CM | POA: Diagnosis not present

## 2019-02-20 DIAGNOSIS — F3289 Other specified depressive episodes: Secondary | ICD-10-CM

## 2019-02-26 DIAGNOSIS — M5412 Radiculopathy, cervical region: Secondary | ICD-10-CM | POA: Diagnosis not present

## 2019-02-26 DIAGNOSIS — M25531 Pain in right wrist: Secondary | ICD-10-CM | POA: Diagnosis not present

## 2019-02-26 DIAGNOSIS — M24131 Other articular cartilage disorders, right wrist: Secondary | ICD-10-CM | POA: Diagnosis not present

## 2019-02-27 MED FILL — AMOXICILLIN 500 MG CAPSULE: 500 | 5 days supply | Qty: 20 | Fill #0

## 2019-02-27 MED FILL — LOSARTAN POTASSIUM 100 MG T: 100 | 90 days supply | Qty: 90 | Fill #0

## 2019-02-27 MED FILL — FLECAINIDE ACETATE 50 MG TA: 50 | 90 days supply | Qty: 180 | Fill #2

## 2019-02-28 ENCOUNTER — Ambulatory Visit (INDEPENDENT_AMBULATORY_CARE_PROVIDER_SITE_OTHER): Payer: 59 | Admitting: Family Medicine

## 2019-02-28 ENCOUNTER — Other Ambulatory Visit: Payer: Self-pay

## 2019-02-28 ENCOUNTER — Encounter (INDEPENDENT_AMBULATORY_CARE_PROVIDER_SITE_OTHER): Payer: Self-pay | Admitting: Family Medicine

## 2019-02-28 VITALS — BP 114/71 | HR 62 | Temp 98.6°F | Ht 61.0 in | Wt 252.0 lb

## 2019-02-28 DIAGNOSIS — E119 Type 2 diabetes mellitus without complications: Secondary | ICD-10-CM | POA: Diagnosis not present

## 2019-02-28 DIAGNOSIS — Z6841 Body Mass Index (BMI) 40.0 and over, adult: Secondary | ICD-10-CM | POA: Diagnosis not present

## 2019-02-28 DIAGNOSIS — Z794 Long term (current) use of insulin: Secondary | ICD-10-CM | POA: Diagnosis not present

## 2019-03-01 LAB — COMPREHENSIVE METABOLIC PANEL
ALT: 26 IU/L (ref 0–32)
AST: 23 IU/L (ref 0–40)
Albumin/Globulin Ratio: 1.5 (ref 1.2–2.2)
Albumin: 4.2 g/dL (ref 3.8–4.8)
Alkaline Phosphatase: 80 IU/L (ref 39–117)
BUN/Creatinine Ratio: 25 (ref 12–28)
BUN: 27 mg/dL (ref 8–27)
Bilirubin Total: 0.4 mg/dL (ref 0.0–1.2)
CO2: 23 mmol/L (ref 20–29)
Calcium: 9.7 mg/dL (ref 8.7–10.3)
Chloride: 101 mmol/L (ref 96–106)
Creatinine, Ser: 1.08 mg/dL — ABNORMAL HIGH (ref 0.57–1.00)
GFR calc Af Amer: 61 mL/min/{1.73_m2} (ref >59)
GFR calc non Af Amer: 53 mL/min/{1.73_m2} — ABNORMAL LOW (ref >59)
Globulin, Total: 2.8 g/dL (ref 1.5–4.5)
Glucose: 118 mg/dL — ABNORMAL HIGH (ref 65–99)
Potassium: 4.1 mmol/L (ref 3.5–5.2)
Sodium: 137 mmol/L (ref 134–144)
Total Protein: 7 g/dL (ref 6.0–8.5)

## 2019-03-04 NOTE — Progress Notes (Signed)
Office: [phone]  /  Fax: [phone]   HPI:  Chief Complaint: OBESITY [name] is here to discuss her progress with her obesity treatment plan. She is on the Category 2 plan and states she is following her eating plan approximately 60% of the time. She states she is walking at work.   [name] continues to do very well with weight loss on her plan. Her birthday will be soon and then Christmas, but she feels she will do well with PC. She notes some work stresses and temptations.  Today's visit was #18 Starting weight: 265 lbs Starting date: 07/18/2017 Today's weight: 252 lbs Today's date: 02/28/2019 Total lbs lost to date: 13  Total lbs lost since last in-office visit: 2  Diabetes Mellitus Type II Anhar has a diagnosis of diabetes mellitus type II. She had an A1c done at [facility] recently. She is doing very well with diet and weight loss. No hypoglycemia.  ASSESSMENT AND PLAN:  Type 2 diabetes mellitus without complication, with long-term current use of insulin (HCC) - Plan: Comprehensive Metabolic Panel (CMET)  Class 3 severe obesity with serious comorbidity and body mass index (BMI) of 45.0 to 49.9 in adult, unspecified obesity type (HCC)  PLAN:  Diabetes II [name] has been given diabetes education by myself today. Good blood sugar control is important to decrease the likelihood of diabetic complications such as nephropathy, neuropathy, limb loss, blindness, coronary artery disease, and death. Intensive lifestyle modification including diet, exercise and weight loss were discussed as the first line treatment for diabetes.  We will get labs from [place] and enter in Epic. Will check CMP. She will continue diet and exercise.  Obesity [name] is currently in the action stage of change. As such, her goal is to continue with weight loss efforts. She has agreed to follow the Category 2 plan. [name] will work up to a goal of 150 minutes of combined cardio and strengthening exercise per  week for weight loss and overall health benefits. We discussed the following Behavioral Modification Strategies today: holiday eating strategies.   [name] has agreed to follow-up with our clinic in 4 weeks. She was informed of the importance of frequent follow-up visits to maximize her success with intensive lifestyle modifications for her multiple health conditions.  ALLERGIES: Allergies  Allergen Reactions  . Actos [Pioglitazone Hydrochloride] Swelling and Other (See Comments)    Numbness in lips, HEADACHES SWELLING REACTION UNSPECIFIED   . Avandia [Rosiglitazone] Swelling    Numbness in lips, headaches  SWELLING REACTION UNSPECIFIED   . Tetanus Toxoids Other (See Comments)    Arm swelling, fainted, ?Respiratory distress  (horse serum)  . Food Nausea Only    Eggplant    MEDICATIONS: Current Outpatient Medications on File Prior to Visit  Medication Sig Dispense Refill  . Aflibercept (EYLEA) 2 MG/0.05ML SOLN 2 mg by Intravitreal route every 30 (thirty) days.    [name] amLODipine (NORVASC) 5 MG tablet Take 5 mg by mouth every evening.     [name] atorvastatin (LIPITOR) 40 MG tablet Take 40 mg by mouth every evening.     . calcium carbonate (CALCIUM 600) 600 MG TABS tablet Take 600 mg by mouth daily with breakfast.    . celecoxib (CELEBREX) 100 MG capsule Take 1 capsule (100 mg total) by mouth 2 (two) times daily. 60 capsule 2  . cholecalciferol (VITAMIN D3) 25 MCG (1000 UT) tablet Take 1,000 Units by mouth daily.    . DULoxetine (CYMBALTA) 60 MG capsule Take 60 mg by mouth  daily.    . flecainide (TAMBOCOR) 50 MG tablet TAKE 1 TABLET BY MOUTH TWO TIMES DAILY (PLEASE KEEP MARCH APPOINTMENT) 180 tablet 3  . hydrochlorothiazide (MICROZIDE) 12.5 MG capsule Take 12.5 mg by mouth daily.   3  . HYDROcodone-acetaminophen (NORCO/VICODIN) 5-325 MG tablet TAKE 1 TABLET BY MOUTH EVERY 6 HOURS AS NEEDED FOR MODERATE PAIN 30 tablet 0  . insulin glargine (LANTUS) 100 UNIT/ML injection Inject 48 Units into the  skin 2 (two) times daily.     . Levothyroxine Sodium 137 MCG CAPS Take 137 mcg by mouth daily before breakfast.     . Melatonin 5 MG CAPS Take 5-10 mg by mouth at bedtime as needed (sleep).     . metFORMIN (GLUCOPHAGE-XR) 500 MG 24 hr tablet Take 1,000 mg by mouth daily with supper.    . methocarbamol (ROBAXIN) 500 MG tablet TAKE 1 TABLET BY MOUTH EVERY 6 HOURS AS NEEDED FOR MUSCLE SPASMS 60 tablet 1  . metoprolol tartrate (LOPRESSOR) 50 MG tablet Take 1 tablet (50 mg total) by mouth 2 (two) times daily. 180 tablet 3  . omeprazole (PRILOSEC) 20 MG capsule Take 20 mg by mouth daily.     . rivaroxaban (XARELTO) 20 MG TABS tablet TAKE 1 TABLET BY MOUTH ONCE DAILY WITH SUPPER (Patient taking differently: Take 20 mg by mouth daily with supper. TAKE 1 TABLET BY MOUTH ONCE DAILY WITH SUPPER) 90 tablet 3  . TRULICITY 3 MG/0.5ML SOPN Inject 3 mg as directed every Sunday.   11  . valsartan (DIOVAN) 320 MG tablet Take 320 mg by mouth daily.   3   No current facility-administered medications on file prior to visit.    PAST MEDICAL HISTORY: Past Medical History:  Diagnosis Date  . A-fib (HCC)   . Anemia    hx of  . Arthritis   . Chest pain   . Constipation   . Depression   . Diabetes mellitus ORAL MEDS  . Diabetic retinopathy (HCC)   . Dyspnea    with minimal exertion-deconditioned  . Dyspnea   . Dysrhythmia    a-fib  . Food allergy   . GERD (gastroesophageal reflux disease)    occasional  . Gout   . History of kidney stones   . Hypercholesteremia   . Hyperlipidemia   . Hypertension   . Hypertensive kidney disease   . Hypothyroidism   . Knee pain, right   . 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 Obesity   . OSA on CPAP    uses CPAP nightly  . Osteoarthritis   . Palpitations   . Pinched nerve in neck   . Pleurisy   . Pneumonia    hx of  . PONV (postoperative nausea and vomiting)    for 3-4 days after general anesthesia  . Sciatica   . Swelling of knee joint, right    . Synovitis of knee RIGHT  . Trigger finger, left    left index  . Vitamin D deficiency     PAST SURGICAL HISTORY: Past Surgical History:  Procedure Laterality Date  . CESAREAN SECTION  X3  . KNEE ARTHROSCOPY  04/20/2011   Procedure: ARTHROSCOPY KNEE;  Surgeon: Loanne Drilling, MD;  Location: [facility];  Service: Orthopedics;  Laterality: Right;  WITH SYNOVECTOMY  . LEFT CARPAL TUNNEL / LEFT MIDDLE & RING FINGER TRIGGER RELEASE  08-26-2008  . LEFT SHOULDER ARTHROSCOPY W/ DEBRIDEMENT  09-09-2003  . LEFT SHOULDER ARTHROSCOPY/ LEFT THUMB  TRIGGER RELEASE  02-22-2005  . PHOTOCOAGULATION WITH LASER Right 03/20/2018   Procedure: PHOTOCOAGULATION WITH LASER;  Surgeon: Jalene Mullet, MD;  Location: [place];  Service: Ophthalmology;  Laterality: Right;  . PULLEY RELEASE LEFT LONG FINGER  07-14-2009  . RADIAL HEAD ARTHROPLASTY Right 06/17/2018   Procedure: RADIAL HEAD ARTHROPLASTY;  Surgeon: Hiram Gash, MD;  Location: [place];  Service: Orthopedics;  Laterality: Right;  . RIGHT CARPAL TUNNEL/ RIGHT THUMB TRIGGER RELEASE'S  11-28-2006  . RIGHT SHOULDER ARTHROSCOPY W/ ROTATOR CUFF REPAIR  01-13-2004  . SHOULDER ARTHROSCOPY DISTAL CLAVICLE EXCISION AND OPEN ROTATOR CUFF REPAIR  09-07-2004   LEFT  . SHOULDER ARTHROSCOPY W/ ACROMIAL REPAIR  11-29-2005   LEFT  . TONSILLECTOMY AND ADENOIDECTOMY  child  . TOTAL KNEE ARTHROPLASTY  12-14-2009   RIGHT  . TOTAL KNEE ARTHROPLASTY Left 11/05/2012   Procedure: LEFT TOTAL KNEE ARTHROPLASTY;  Surgeon: Gearlean Alf, MD;  Location: [facility];  Service: Orthopedics;  Laterality: Left;  . TOTAL KNEE REVISION Right 07/29/2016   Procedure: RIGHT KNEE POLY-LINER EXCHANGE;  Surgeon: Mcarthur Rossetti, MD;  Location: [facility];  Service: Orthopedics;  Laterality: Right;  . TRIGGER FINGER RELEASE Right 02/21/2013   Procedure: RIGHT RING A-1 PULLEY RELEASE    (MINOR PROCEDURE) ;  Surgeon: Cammie Sickle., MD;  Location: [facility];   Service: Orthopedics;  Laterality: Right;  . TRIGGER FINGER RELEASE Left 09/22/2016   Procedure: RELEASE TRIGGER FINGER LEFT INDEX FINGER;  Surgeon: Mcarthur Rossetti, MD;  Location: [place];  Service: Orthopedics;  Laterality: Left;  . TRIGGER FINGER RELEASE Right 01/20/2017   Procedure: RELEASE TRIGGER FINGER/A-1 PULLEY RIGHT INDEX FINGER;  Surgeon: Melrose Nakayama, MD;  Location: [place];  Service: Orthopedics;  Laterality: Right;  . VITRECTOMY 25 GAUGE WITH SCLERAL BUCKLE Right 03/20/2018   Procedure: RIGHT EYE VITRECTOMY WITH  ENDOLASER PARENTAL PHOTOCOAGULATION 25 GAUGE;  Surgeon: Jalene Mullet, MD;  Location: [place];  Service: Ophthalmology;  Laterality: Right;    SOCIAL HISTORY: Social History   Tobacco Use  . Smoking status: Former Smoker    Years: 5.00    Types: Cigarettes    Quit date: 04/12/1977    Years since quitting: 41.9  . Smokeless tobacco: Never Used  Substance Use Topics  . Alcohol use: No  . Drug use: No    FAMILY HISTORY: Family History  Problem Relation Age of Onset  . Diabetes Mother   . Hypertension Mother   . Heart attack Mother   . Thyroid disease Mother   . Obesity Mother   . Cancer Father   . Liver disease Father   . Sleep apnea Father   . Alcoholism Father    ROS: Review of Systems  Constitutional: Positive for weight loss.  Endo/Heme/Allergies:       Negative for hypoglycemia.   PHYSICAL EXAM: Blood pressure 114/71, pulse 62, temperature 98.6 F (37 C), temperature source Oral, height 5\' 1"  (1.549 m), weight 252 lb (114.3 kg), SpO2 96 %. Body mass index is 47.61 kg/m. Physical Exam Vitals reviewed.  Constitutional:      Appearance: Normal appearance. She is obese.  Cardiovascular:     Rate and Rhythm: Normal rate.     Pulses: Normal pulses.  Pulmonary:     Effort: Pulmonary effort is normal.     Breath sounds: Normal breath sounds.  Musculoskeletal:        General: Normal range of motion.  Skin:    General:  Skin is warm and dry.  Neurological:     Mental Status: She is alert and oriented to person, place, and time.  Psychiatric:        Behavior: Behavior normal.   RECENT LABS AND TESTS: BMET    Component Value Date/Time   NA 137 02/28/2019 1155   K 4.1 02/28/2019 1155   CL 101 02/28/2019 1155   CO2 23 02/28/2019 1155   GLUCOSE 118 (H) 02/28/2019 1155   GLUCOSE 124 (H) 06/16/2018 1742   BUN 27 02/28/2019 1155   CREATININE 1.08 (H) 02/28/2019 1155   CALCIUM 9.7 02/28/2019 1155   GFRNONAA 53 (L) 02/28/2019 1155   GFRAA 61 02/28/2019 1155   Lab Results  Component Value Date   HGBA1C 7.4 (H) 11/28/2017   HGBA1C 6.8 (H) 07/18/2017   HGBA1C 7.7 (H) 07/20/2016   HGBA1C 7.9 09/18/2014   No results found for: INSULIN CBC    Component Value Date/Time   WBC 12.7 (H) 06/16/2018 1742   RBC 5.20 (H) 06/16/2018 1742   HGB 15.3 (H) 06/16/2018 1742   HGB 13.0 07/18/2017 1112   HCT 50.4 (H) 06/16/2018 1742   HCT 39.6 07/18/2017 1112   PLT 344 06/16/2018 1742   MCV 96.9 06/16/2018 1742   MCV 87 07/18/2017 1112   MCH 29.4 06/16/2018 1742   MCHC 30.4 06/16/2018 1742   RDW 14.3 06/16/2018 1742   RDW 15.3 07/18/2017 1112   LYMPHSABS 2.1 06/16/2018 1742   LYMPHSABS 1.8 07/18/2017 1112   MONOABS 0.9 06/16/2018 1742   EOSABS 0.1 06/16/2018 1742   EOSABS 0.1 07/18/2017 1112   BASOSABS 0.1 06/16/2018 1742   BASOSABS 0.0 07/18/2017 1112   Iron/TIBC/Ferritin/ %Sat No results found for: IRON, TIBC, FERRITIN, IRONPCTSAT Lipid Panel     Component Value Date/Time   CHOL 126 07/18/2017 1112   TRIG 98 07/18/2017 1112   HDL 43 07/18/2017 1112   LDLCALC 63 07/18/2017 1112   Hepatic Function Panel     Component Value Date/Time   PROT 7.0 02/28/2019 1155   ALBUMIN 4.2 02/28/2019 1155   AST 23 02/28/2019 1155   ALT 26 02/28/2019 1155   ALKPHOS 80 02/28/2019 1155   BILITOT 0.4 02/28/2019 1155      Component Value Date/Time   TSH 3.740 07/18/2017 1112      I, Marianna Paymentenise Haag, am acting  as Energy managertranscriptionist for Quillian Quincearen Jameelah Watts, MD I have reviewed the above documentation for accuracy and completeness, and I agree with the above. -Quillian Quincearen Aphrodite Harpenau, MD

## 2019-03-05 ENCOUNTER — Telehealth: Payer: Self-pay | Admitting: *Deleted

## 2019-03-05 MED FILL — TRULICITY 3 MG/0.5ML SOPN: 3 | 28 days supply | Qty: 2 | Fill #1

## 2019-03-05 MED FILL — DULOXETINE HCL 60 MG CPEP: 60 | 30 days supply | Qty: 30 | Fill #7

## 2019-03-05 NOTE — Telephone Encounter (Signed)
Pt takes Xarelto for afib with CHADS2VASc score of 4 (age, sex, HTN, DM). Renal function is normal. Recommend holding Xarelto for 3 days prior to spinal procedure.

## 2019-03-05 NOTE — Telephone Encounter (Signed)
I attempted call to pt to update status. LM on VM. If no changes, she would be ok for procedure.

## 2019-03-05 NOTE — Telephone Encounter (Signed)
   [facility] Pre-operative Risk Assessment    Request for surgical clearance:  1. What type of surgery is being performed? CERVICAL EPIDURAL INJECTION   2. When is this surgery scheduled? TBD   3. What type of clearance is required (medical clearance vs. Pharmacy clearance to hold med vs. Both)? BOTH  4. Are there any medications that need to be held prior to surgery and how long? XARELTO X 3 DAYS PRIOR TO INJECTION   5. Practice name and name of physician performing surgery?  [facility]; DR. HAO WANG   6. What is your office phone number [phone]    7.   What is your office fax number [phone]  8.   Anesthesia type (None, local, MAC, general) ? NONE LISTED   [name] 03/05/2019, 12:08 PM  _________________________________________________________________   (provider comments below)

## 2019-03-05 NOTE — Telephone Encounter (Signed)
   Primary Cardiologist: [name], MD  Chart reviewed as part of pre-operative protocol coverage. Patient was contacted 03/05/2019 in reference to pre-operative risk assessment for pending surgery as outlined below.  [name] was last seen on 05/14/2018 by Dr. Johnsie Cancel.  Since that day, [name] has done well with no cardiac complaints.  Therefore, based on ACC/AHA guidelines, the patient would be at acceptable risk for the planned procedure without further cardiovascular testing.   According to our pharmacy protocol: Pt takes Xarelto for afib with CHADS2VASc score of 4 (age, sex, HTN, DM). Renal function is normal. Recommend holding Xarelto for 3 days prior to spinal procedure.  I will route this recommendation to the requesting party via Epic fax function and remove from pre-op pool.  Please call with questions.  Daune Perch, NP 03/05/2019, 4:11 PM

## 2019-03-18 MED FILL — ATORVASTATIN 40 MG TABLET: 40 | 90 days supply | Qty: 90 | Fill #2

## 2019-03-18 MED FILL — XARELTO 20 MG TABLET: 20 | 90 days supply | Qty: 90 | Fill #3

## 2019-03-22 DIAGNOSIS — E039 Hypothyroidism, unspecified: Secondary | ICD-10-CM | POA: Diagnosis not present

## 2019-03-26 DIAGNOSIS — M25531 Pain in right wrist: Secondary | ICD-10-CM | POA: Diagnosis not present

## 2019-03-26 DIAGNOSIS — M24131 Other articular cartilage disorders, right wrist: Secondary | ICD-10-CM | POA: Diagnosis not present

## 2019-03-27 ENCOUNTER — Other Ambulatory Visit: Payer: Self-pay | Admitting: Orthopedic Surgery

## 2019-03-27 ENCOUNTER — Telehealth: Payer: Self-pay | Admitting: *Deleted

## 2019-03-27 NOTE — Telephone Encounter (Signed)
   [facility] Pre-operative Risk Assessment    Request for surgical clearance:  1. What type of surgery is being performed? RIGHT WRIST ARTHROSCOPY W/DEBRIDEMENT vs REPAIR   2. When is this surgery scheduled? 04/26/19   3. What type of clearance is required (medical clearance vs. Pharmacy clearance to hold med vs. Both)? BOTH  4. Are there any medications that need to be held prior to surgery and how long? XARELTO;  5. Practice name and name of physician performing surgery? [facility];  DR. Charlotte Crumb   6. What is your office phone number [phone]    7.   What is your office fax number [phone] ATTN: Odessa Fleming, RN   8.   Anesthesia type (None, local, MAC, general) ?  GENERAL   [name] 03/27/2019, 11:54 AM  _________________________________________________________________   (provider comments below)

## 2019-03-27 NOTE — Telephone Encounter (Signed)
   Primary Cardiologist: Charlton Haws, MD  Chart reviewed as part of pre-operative protocol coverage. Patient followed by Dr. Eden Emms for paroxysmal atrial fibrillation. Last seen in 05/2018. I called patient today has been doing well from a cardiac standpoint since last visit. She notes some mild shortness of breath if she walk for >10 minutes but states this is not new and is not worse than usual. No chest pain, palpitations, lightheadedness, dizziness, syncope, or acute CHF symptoms. She is able to complete >4.0 METS with no anginal symptoms. Per Revised Cardiac Risk Index, considered low risk. Given past medical history and time since last visit, based on ACC/AHA guidelines, [name] would be at acceptable risk for the planned procedure without further cardiovascular testing.   Per Pharmacy and office protocol, "OK to hold Xarelto for 1-2 days prior to procedure." Should resume as soon as possible from a procedural standpoint.   I will route this recommendation to the requesting party via Epic fax function and remove from pre-op pool.  Please call with questions.  Corrin Parker, PA-C 03/27/2019, 3:22 PM

## 2019-03-27 NOTE — Telephone Encounter (Signed)
Pt takes Xarelto for afib with CHADS2VASc score of 4 (age, sex, HTN, DM). Renal function is normal. Ok to hold Xarelto for 1-2 days prior to procedure.

## 2019-04-02 MED FILL — AMLODIPINE BESYLATE 5 MG TA: 5 | 90 days supply | Qty: 90 | Fill #1

## 2019-04-02 MED FILL — TRULICITY 3 MG/0.5ML SOPN: 3 | 28 days supply | Qty: 2 | Fill #2

## 2019-04-03 ENCOUNTER — Ambulatory Visit (INDEPENDENT_AMBULATORY_CARE_PROVIDER_SITE_OTHER): Payer: 59 | Admitting: Family Medicine

## 2019-04-10 DIAGNOSIS — E113511 Type 2 diabetes mellitus with proliferative diabetic retinopathy with macular edema, right eye: Secondary | ICD-10-CM | POA: Diagnosis not present

## 2019-04-12 MED FILL — DULOXETINE HCL 60 MG CPEP: 60 | 30 days supply | Qty: 30 | Fill #8

## 2019-04-17 DIAGNOSIS — H34831 Tributary (branch) retinal vein occlusion, right eye, with macular edema: Secondary | ICD-10-CM | POA: Diagnosis not present

## 2019-04-17 DIAGNOSIS — H43811 Vitreous degeneration, right eye: Secondary | ICD-10-CM | POA: Diagnosis not present

## 2019-04-17 DIAGNOSIS — H25813 Combined forms of age-related cataract, bilateral: Secondary | ICD-10-CM | POA: Diagnosis not present

## 2019-04-17 DIAGNOSIS — H3582 Retinal ischemia: Secondary | ICD-10-CM | POA: Diagnosis not present

## 2019-04-17 DIAGNOSIS — H3581 Retinal edema: Secondary | ICD-10-CM | POA: Diagnosis not present

## 2019-04-17 DIAGNOSIS — E113312 Type 2 diabetes mellitus with moderate nonproliferative diabetic retinopathy with macular edema, left eye: Secondary | ICD-10-CM | POA: Diagnosis not present

## 2019-04-17 DIAGNOSIS — E113511 Type 2 diabetes mellitus with proliferative diabetic retinopathy with macular edema, right eye: Secondary | ICD-10-CM | POA: Diagnosis not present

## 2019-04-24 ENCOUNTER — Other Ambulatory Visit: Payer: Self-pay

## 2019-04-24 ENCOUNTER — Encounter (HOSPITAL_BASED_OUTPATIENT_CLINIC_OR_DEPARTMENT_OTHER): Payer: Self-pay | Admitting: Orthopedic Surgery

## 2019-04-25 MED FILL — METOPROLOL TARTRATE 50 MG T: 50 | 90 days supply | Qty: 180 | Fill #3

## 2019-04-25 MED FILL — LEVOTHYROXINE 137 MCG TABLE: 137 | 90 days supply | Qty: 90 | Fill #1

## 2019-04-25 MED FILL — OMEPRAZOLE 20 MG CAP: 20 | 90 days supply | Qty: 90 | Fill #2

## 2019-04-25 MED FILL — FREESTYLE LITE TEST STRIP: 90 days supply | Qty: 300 | Fill #0

## 2019-04-25 MED FILL — TRULICITY 3 MG/0.5ML SOPN: 3 | 28 days supply | Qty: 2 | Fill #3

## 2019-04-25 MED FILL — HYDROCHLOROTHIAZIDE 12.5 MG: 12.5 | 90 days supply | Qty: 90 | Fill #1

## 2019-04-25 MED FILL — LANTUS 100 UNITS/ML VIAL: 100 | 90 days supply | Qty: 90 | Fill #1

## 2019-04-25 MED FILL — hydrALAZINE HCL 25 MG TABS: 25 | 90 days supply | Qty: 180 | Fill #1

## 2019-04-26 ENCOUNTER — Other Ambulatory Visit: Payer: Self-pay

## 2019-04-26 ENCOUNTER — Encounter (HOSPITAL_BASED_OUTPATIENT_CLINIC_OR_DEPARTMENT_OTHER)
Admission: RE | Admit: 2019-04-26 | Discharge: 2019-04-26 | Disposition: A | Discharge status: Home / Self Care | Payer: 59 | Source: Ambulatory Visit | Attending: Orthopedic Surgery | Admitting: Orthopedic Surgery

## 2019-04-26 DIAGNOSIS — Z01812 Encounter for preprocedural laboratory examination: Secondary | ICD-10-CM | POA: Insufficient documentation

## 2019-04-26 LAB — BASIC METABOLIC PANEL
Anion gap: 11 (ref 5–15)
BUN: 31 mg/dL — ABNORMAL HIGH (ref 8–23)
CO2: 25 mmol/L (ref 22–32)
Calcium: 9.5 mg/dL (ref 8.9–10.3)
Chloride: 101 mmol/L (ref 98–111)
Creatinine, Ser: 1.23 mg/dL — ABNORMAL HIGH (ref 0.44–1.00)
GFR calc Af Amer: 51 mL/min — ABNORMAL LOW (ref >60)
GFR calc non Af Amer: 44 mL/min — ABNORMAL LOW (ref >60)
Glucose, Bld: 224 mg/dL — ABNORMAL HIGH (ref 70–99)
Potassium: 4.3 mmol/L (ref 3.5–5.1)
Sodium: 137 mmol/L (ref 135–145)

## 2019-04-27 ENCOUNTER — Other Ambulatory Visit (HOSPITAL_COMMUNITY)
Admission: RE | Admit: 2019-04-27 | Discharge: 2019-04-27 | Disposition: A | Discharge status: Home / Self Care | Payer: 59 | Source: Ambulatory Visit | Attending: Orthopedic Surgery | Admitting: Orthopedic Surgery

## 2019-04-27 DIAGNOSIS — Z20822 Contact with and (suspected) exposure to covid-19: Secondary | ICD-10-CM | POA: Insufficient documentation

## 2019-04-27 DIAGNOSIS — Z01812 Encounter for preprocedural laboratory examination: Secondary | ICD-10-CM | POA: Diagnosis not present

## 2019-04-27 LAB — SARS CORONAVIRUS 2 (TAT 6-24 HRS): SARS Coronavirus 2: NEGATIVE

## 2019-05-01 ENCOUNTER — Ambulatory Visit (HOSPITAL_BASED_OUTPATIENT_CLINIC_OR_DEPARTMENT_OTHER)
Admission: RE | Admit: 2019-05-01 | Discharge: 2019-05-01 | Disposition: A | Discharge status: Home / Self Care | Payer: 59 | Attending: Orthopedic Surgery | Admitting: Orthopedic Surgery

## 2019-05-01 ENCOUNTER — Ambulatory Visit (HOSPITAL_BASED_OUTPATIENT_CLINIC_OR_DEPARTMENT_OTHER): Payer: 59 | Admitting: Anesthesiology

## 2019-05-01 ENCOUNTER — Other Ambulatory Visit: Payer: Self-pay

## 2019-05-01 ENCOUNTER — Encounter (HOSPITAL_BASED_OUTPATIENT_CLINIC_OR_DEPARTMENT_OTHER)
Admission: RE | Disposition: A | Discharge status: Home / Self Care | Payer: Self-pay | Source: Home / Self Care | Attending: Orthopedic Surgery

## 2019-05-01 ENCOUNTER — Encounter (HOSPITAL_BASED_OUTPATIENT_CLINIC_OR_DEPARTMENT_OTHER): Payer: Self-pay | Admitting: Orthopedic Surgery

## 2019-05-01 DIAGNOSIS — K219 Gastro-esophageal reflux disease without esophagitis: Secondary | ICD-10-CM | POA: Insufficient documentation

## 2019-05-01 DIAGNOSIS — I4891 Unspecified atrial fibrillation: Secondary | ICD-10-CM | POA: Insufficient documentation

## 2019-05-01 DIAGNOSIS — Z79899 Other long term (current) drug therapy: Secondary | ICD-10-CM | POA: Diagnosis not present

## 2019-05-01 DIAGNOSIS — E1122 Type 2 diabetes mellitus with diabetic chronic kidney disease: Secondary | ICD-10-CM | POA: Diagnosis not present

## 2019-05-01 DIAGNOSIS — Z6841 Body Mass Index (BMI) 40.0 and over, adult: Secondary | ICD-10-CM | POA: Diagnosis not present

## 2019-05-01 DIAGNOSIS — Z791 Long term (current) use of non-steroidal anti-inflammatories (NSAID): Secondary | ICD-10-CM | POA: Insufficient documentation

## 2019-05-01 DIAGNOSIS — F329 Major depressive disorder, single episode, unspecified: Secondary | ICD-10-CM | POA: Diagnosis not present

## 2019-05-01 DIAGNOSIS — E7800 Pure hypercholesterolemia, unspecified: Secondary | ICD-10-CM | POA: Insufficient documentation

## 2019-05-01 DIAGNOSIS — Z7901 Long term (current) use of anticoagulants: Secondary | ICD-10-CM | POA: Insufficient documentation

## 2019-05-01 DIAGNOSIS — Z79890 Hormone replacement therapy (postmenopausal): Secondary | ICD-10-CM | POA: Insufficient documentation

## 2019-05-01 DIAGNOSIS — I129 Hypertensive chronic kidney disease with stage 1 through stage 4 chronic kidney disease, or unspecified chronic kidney disease: Secondary | ICD-10-CM | POA: Insufficient documentation

## 2019-05-01 DIAGNOSIS — Z96653 Presence of artificial knee joint, bilateral: Secondary | ICD-10-CM | POA: Insufficient documentation

## 2019-05-01 DIAGNOSIS — E119 Type 2 diabetes mellitus without complications: Secondary | ICD-10-CM | POA: Diagnosis not present

## 2019-05-01 DIAGNOSIS — Z794 Long term (current) use of insulin: Secondary | ICD-10-CM | POA: Insufficient documentation

## 2019-05-01 DIAGNOSIS — S63591A Other specified sprain of right wrist, initial encounter: Secondary | ICD-10-CM | POA: Diagnosis not present

## 2019-05-01 DIAGNOSIS — X58XXXA Exposure to other specified factors, initial encounter: Secondary | ICD-10-CM | POA: Insufficient documentation

## 2019-05-01 DIAGNOSIS — E785 Hyperlipidemia, unspecified: Secondary | ICD-10-CM | POA: Insufficient documentation

## 2019-05-01 DIAGNOSIS — G4733 Obstructive sleep apnea (adult) (pediatric): Secondary | ICD-10-CM | POA: Diagnosis not present

## 2019-05-01 DIAGNOSIS — Z87891 Personal history of nicotine dependence: Secondary | ICD-10-CM | POA: Diagnosis not present

## 2019-05-01 DIAGNOSIS — E11319 Type 2 diabetes mellitus with unspecified diabetic retinopathy without macular edema: Secondary | ICD-10-CM | POA: Diagnosis not present

## 2019-05-01 DIAGNOSIS — N189 Chronic kidney disease, unspecified: Secondary | ICD-10-CM | POA: Insufficient documentation

## 2019-05-01 DIAGNOSIS — E039 Hypothyroidism, unspecified: Secondary | ICD-10-CM | POA: Diagnosis not present

## 2019-05-01 DIAGNOSIS — E559 Vitamin D deficiency, unspecified: Secondary | ICD-10-CM | POA: Diagnosis not present

## 2019-05-01 DIAGNOSIS — I10 Essential (primary) hypertension: Secondary | ICD-10-CM | POA: Diagnosis not present

## 2019-05-01 HISTORY — PX: WRIST ARTHROSCOPY WITH DEBRIDEMENT: SHX6194

## 2019-05-01 LAB — GLUCOSE, CAPILLARY: Glucose-Capillary: 92 mg/dL (ref 70–99)

## 2019-05-01 SURGERY — WRIST ARTHROSCOPY WITH DEBRIDEMENT
Anesthesia: Monitor Anesthesia Care | Site: Wrist | Laterality: Right

## 2019-05-01 MED ORDER — CHLORHEXIDINE GLUCONATE 4 % EX LIQD: 60.0000 mL | Freq: Once | CUTANEOUS | Status: DC

## 2019-05-01 MED ORDER — ONDANSETRON HCL 4 MG/2ML IJ SOLN
INTRAMUSCULAR | Status: DC | PRN
  Administered 2019-05-01: 4 mg via INTRAVENOUS

## 2019-05-01 MED ORDER — ROPIVACAINE HCL 5 MG/ML IJ SOLN
INTRAMUSCULAR | Status: DC | PRN
  Administered 2019-05-01: 20 mL via PERINEURAL

## 2019-05-01 MED ORDER — CEFAZOLIN SODIUM-DEXTROSE 2-4 GM/100ML-% IV SOLN
INTRAVENOUS | Status: AC
  Filled 2019-05-01: qty 100

## 2019-05-01 MED ORDER — PROPOFOL 500 MG/50ML IV EMUL
INTRAVENOUS | Status: DC | PRN
  Administered 2019-05-01: 120 ug/kg/min via INTRAVENOUS

## 2019-05-01 MED ORDER — FENTANYL CITRATE (PF) 100 MCG/2ML IJ SOLN
INTRAMUSCULAR | Status: AC
  Filled 2019-05-01: qty 2

## 2019-05-01 MED ORDER — MIDAZOLAM HCL 2 MG/2ML IJ SOLN
1.0000 mg | INTRAMUSCULAR | Status: DC | PRN
  Administered 2019-05-01: 1 mg via INTRAVENOUS

## 2019-05-01 MED ORDER — LACTATED RINGERS IV SOLN: INTRAVENOUS | Status: DC

## 2019-05-01 MED ORDER — FENTANYL CITRATE (PF) 100 MCG/2ML IJ SOLN: 25.0000 ug | INTRAMUSCULAR | Status: DC | PRN

## 2019-05-01 MED ORDER — FENTANYL CITRATE (PF) 250 MCG/5ML IJ SOLN
INTRAMUSCULAR | Status: DC | PRN
  Administered 2019-05-01 (×2): 25 ug via INTRAVENOUS

## 2019-05-01 MED ORDER — CEFAZOLIN SODIUM-DEXTROSE 1-4 GM/50ML-% IV SOLN
INTRAVENOUS | Status: AC
  Filled 2019-05-01: qty 50

## 2019-05-01 MED ORDER — ONDANSETRON HCL 4 MG/2ML IJ SOLN
INTRAMUSCULAR | Status: AC
  Filled 2019-05-01: qty 2

## 2019-05-01 MED ORDER — MIDAZOLAM HCL 2 MG/2ML IJ SOLN
INTRAMUSCULAR | Status: AC
  Filled 2019-05-01: qty 2

## 2019-05-01 MED ORDER — ONDANSETRON HCL 4 MG/2ML IJ SOLN: 4.0000 mg | Freq: Once | INTRAMUSCULAR | Status: DC | PRN

## 2019-05-01 MED ORDER — POVIDONE-IODINE 10 % EX SWAB
2.0000 "application " | Freq: Once | CUTANEOUS | Status: AC
  Administered 2019-05-01: 2 via TOPICAL

## 2019-05-01 MED ORDER — FENTANYL CITRATE (PF) 100 MCG/2ML IJ SOLN
50.0000 ug | INTRAMUSCULAR | Status: DC | PRN
  Administered 2019-05-01: 50 ug via INTRAVENOUS

## 2019-05-01 MED ORDER — DEXAMETHASONE SODIUM PHOSPHATE 10 MG/ML IJ SOLN
INTRAMUSCULAR | Status: DC | PRN
  Administered 2019-05-01: 5 mg

## 2019-05-01 MED ORDER — CEFAZOLIN SODIUM-DEXTROSE 2-4 GM/100ML-% IV SOLN
2.0000 g | INTRAVENOUS | Status: AC
  Administered 2019-05-01: 3 g via INTRAVENOUS

## 2019-05-01 MED ORDER — BUPIVACAINE HCL (PF) 0.25 % IJ SOLN
INTRAMUSCULAR | Status: AC
  Filled 2019-05-01: qty 30

## 2019-05-01 MED FILL — HYDROCODON-APAP 5-325: 5-325 | 5 days supply | Qty: 20 | Fill #0

## 2019-05-01 SURGICAL SUPPLY — 62 items
BAG DECANTER FOR FLEXI CONT (MISCELLANEOUS) IMPLANT
BLADE MINI RND TIP GREEN BEAV (BLADE) IMPLANT
BLADE SURG 15 STRL LF DISP TIS (BLADE) ×1 IMPLANT
BLADE SURG 15 STRL SS (BLADE) ×2
BNDG CMPR 9X4 STRL LF SNTH (GAUZE/BANDAGES/DRESSINGS) ×1
BNDG ELASTIC 4X5.8 VLCR STR LF (GAUZE/BANDAGES/DRESSINGS) ×2 IMPLANT
BNDG ESMARK 4X9 LF (GAUZE/BANDAGES/DRESSINGS) ×2 IMPLANT
BNDG GAUZE ELAST 4 BULKY (GAUZE/BANDAGES/DRESSINGS) IMPLANT
BUR GATOR 2.9 (BURR) IMPLANT
CANISTER SUCT 1200ML W/VALVE (MISCELLANEOUS) ×2 IMPLANT
CORD BIPOLAR FORCEPS 12FT (ELECTRODE) IMPLANT
COVER BACK TABLE 60X90IN (DRAPES) ×2 IMPLANT
COVER WAND RF STERILE (DRAPES) IMPLANT
CUFF TOURN SGL QUICK 18X4 (TOURNIQUET CUFF) IMPLANT
DECANTER SPIKE VIAL GLASS SM (MISCELLANEOUS) IMPLANT
DRAPE EXTREMITY T 121X128X90 (DISPOSABLE) ×2 IMPLANT
DRAPE SURG 17X23 STRL (DRAPES) ×2 IMPLANT
DURAPREP 26ML APPLICATOR (WOUND CARE) ×2 IMPLANT
GAUZE 4X4 16PLY RFD (DISPOSABLE) IMPLANT
GAUZE SPONGE 4X4 12PLY STRL (GAUZE/BANDAGES/DRESSINGS) ×2 IMPLANT
GAUZE XEROFORM 1X8 LF (GAUZE/BANDAGES/DRESSINGS) IMPLANT
GLOVE SURG SYN 8.0 (GLOVE) ×4 IMPLANT
GOWN STRL REIN XL XLG (GOWN DISPOSABLE) ×4 IMPLANT
GOWN STRL REUS W/ TWL LRG LVL3 (GOWN DISPOSABLE) ×1 IMPLANT
GOWN STRL REUS W/TWL LRG LVL3 (GOWN DISPOSABLE) ×2
IV NS IRRIG 3000ML ARTHROMATIC (IV SOLUTION) ×2 IMPLANT
IV SET EXT 30 76VOL 4 MALE LL (IV SETS) IMPLANT
NDL SAFETY ECLIPSE 18X1.5 (NEEDLE) ×1 IMPLANT
NEEDLE HYPO 18GX1.5 SHARP (NEEDLE) ×2
NEEDLE HYPO 22GX1.5 SAFETY (NEEDLE) ×2 IMPLANT
NEEDLE SPNL 25GX3.5 QUINCKE BL (NEEDLE) IMPLANT
NEEDLE TUOHY 20GX3.5 (NEEDLE) IMPLANT
NS IRRIG 1000ML POUR BTL (IV SOLUTION) IMPLANT
PACK BASIN DAY SURGERY FS (CUSTOM PROCEDURE TRAY) ×2 IMPLANT
PAD CAST 4YDX4 CTTN HI CHSV (CAST SUPPLIES) ×1 IMPLANT
PADDING CAST ABS 4INX4YD NS (CAST SUPPLIES) ×1
PADDING CAST ABS COTTON 4X4 ST (CAST SUPPLIES) ×1 IMPLANT
PADDING CAST COTTON 4X4 STRL (CAST SUPPLIES) ×2
PROBE BIPOLAR ARTHRO 85MM 30D (MISCELLANEOUS) IMPLANT
SET SM JOINT TUBING/CANN (CANNULA) ×2 IMPLANT
SHAVER DISSECTOR 3.0 (BURR) ×2 IMPLANT
SHAVER SABRE 2.0 (BURR) ×2 IMPLANT
SHEET MEDIUM DRAPE 40X70 STRL (DRAPES) ×2 IMPLANT
SPLINT PLASTER CAST XFAST 3X15 (CAST SUPPLIES) IMPLANT
SPLINT PLASTER CAST XFAST 4X15 (CAST SUPPLIES) ×15 IMPLANT
SPLINT PLASTER XTRA FAST SET 4 (CAST SUPPLIES) ×15
SPLINT PLASTER XTRA FASTSET 3X (CAST SUPPLIES)
STOCKINETTE 4X48 STRL (DRAPES) ×2 IMPLANT
SUT ETHILON 4 0 PS 2 18 (SUTURE) ×2 IMPLANT
SUT PDS AB 2-0 CT2 27 (SUTURE) IMPLANT
SUT PROLENE 3 0 PS 2 (SUTURE) IMPLANT
SUT VICRYL RAPIDE 4-0 (SUTURE) IMPLANT
SUT VICRYL RAPIDE 4/0 PS 2 (SUTURE) IMPLANT
SYR 10ML LL (SYRINGE) ×2 IMPLANT
TOWEL GREEN STERILE FF (TOWEL DISPOSABLE) ×2 IMPLANT
TRAP DIGIT (INSTRUMENTS) ×2 IMPLANT
TRAP FINGER LRG (INSTRUMENTS) IMPLANT
TUBE CONNECTING 20X1/4 (TUBING) ×2 IMPLANT
TUBING ARTHROSCOPY IRRIG 16FT (MISCELLANEOUS) ×2 IMPLANT
UNDERPAD 30X36 HEAVY ABSORB (UNDERPADS AND DIAPERS) ×2 IMPLANT
WAND 1.5 MICROBLATOR (SURGICAL WAND) ×2 IMPLANT
WATER STERILE IRR 1000ML POUR (IV SOLUTION) IMPLANT

## 2019-05-01 NOTE — Anesthesia Procedure Notes (Signed)
Anesthesia Regional Block: Supraclavicular block   Pre-Anesthetic Checklist: ,, timeout performed, Correct Patient, Correct Site, Correct Laterality, Correct Procedure, Correct Position, site marked, Risks and benefits discussed,  Surgical consent,  Pre-op evaluation,  At surgeon's request and post-op pain management  Laterality: Right  Prep: Maximum Sterile Barrier Precautions used, chloraprep       Needles:  Injection technique: Single-shot  Needle Type: Echogenic Stimulator Needle     Needle Length: 4cm  Needle Gauge: 22     Additional Needles:   Procedures:,,,, ultrasound used (permanent image in chart),,,,  Narrative:  Start time: 05/01/2019 1:40 PM End time: 05/01/2019 1:50 PM Injection made incrementally with aspirations every 5 mL.  Performed by: Personally  Anesthesiologist: Elmer Picker, MD  Additional Notes: Monitors applied. No increased pain on injection. No increased resistance to injection. Injection made in 5cc increments. Good needle visualization. Patient tolerated procedure well.

## 2019-05-01 NOTE — Progress Notes (Signed)
Assisted Dr. Woodrum with right, ultrasound guided, supraclavicular block. Side rails up, monitors on throughout procedure. See vital signs in flow sheet. Tolerated Procedure well. 

## 2019-05-01 NOTE — Discharge Instructions (Signed)
°  Post Anesthesia Home Care Instructions ° °Activity: °Get plenty of rest for the remainder of the day. A responsible individual must stay with you for 24 hours following the procedure.  °For the next 24 hours, DO NOT: °-Drive a car °-Operate machinery °-Drink alcoholic beverages °-Take any medication unless instructed by your physician °-Make any legal decisions or sign important papers. ° °Meals: °Start with liquid foods such as gelatin or soup. Progress to regular foods as tolerated. Avoid greasy, spicy, heavy foods. If nausea and/or vomiting occur, drink only clear liquids until the nausea and/or vomiting subsides. Call your physician if vomiting continues. ° °Special Instructions/Symptoms: °Your throat may feel dry or sore from the anesthesia or the breathing tube placed in your throat during surgery. If this causes discomfort, gargle with warm salt water. The discomfort should disappear within 24 hours. ° °If you had a scopolamine patch placed behind your ear for the management of post- operative nausea and/or vomiting: ° °1. The medication in the patch is effective for 72 hours, after which it should be removed.  Wrap patch in a tissue and discard in the trash. Wash hands thoroughly with soap and water. °2. You may remove the patch earlier than 72 hours if you experience unpleasant side effects which may include dry mouth, dizziness or visual disturbances. °3. Avoid touching the patch. Wash your hands with soap and water after contact with the patch. °   ° ° ° °Call your surgeon if you experience:  ° °1.  Fever over 101.0. °2.  Inability to urinate. °3.  Nausea and/or vomiting. °4.  Extreme swelling or bruising at the surgical site. °5.  Continued bleeding from the incision. °6.  Increased pain, redness or drainage from the incision. °7.  Problems related to your pain medication. °8.  Any problems and/or concerns ° ° ° °Regional Anesthesia Blocks ° °1. Numbness or the inability to move the "blocked" extremity  may last from 3-48 hours after placement. The length of time depends on the medication injected and your individual response to the medication. If the numbness is not going away after 48 hours, call your surgeon. ° °2. The extremity that is blocked will need to be protected until the numbness is gone and the  Strength has returned. Because you cannot feel it, you will need to take extra care to avoid injury. Because it may be weak, you may have difficulty moving it or using it. You may not know what position it is in without looking at it while the block is in effect. ° °3. For blocks in the legs and feet, returning to weight bearing and walking needs to be done carefully. You will need to wait until the numbness is entirely gone and the strength has returned. You should be able to move your leg and foot normally before you try and bear weight or walk. You will need someone to be with you when you first try to ensure you do not fall and possibly risk injury. ° °4. Bruising and tenderness at the needle site are common side effects and will resolve in a few days. ° °5. Persistent numbness or new problems with movement should be communicated to the surgeon or the [facility] ([phone])/ [facility] ([phone]). °

## 2019-05-01 NOTE — Anesthesia Postprocedure Evaluation (Signed)
Anesthesia Post Note  Patient: [name]  Procedure(s) Performed: WRIST ARTHROSCOPY WITH DEBRIDEMENT (Right Wrist)     Patient location during evaluation: PACU Anesthesia Type: Regional Level of consciousness: sedated and patient cooperative Pain management: pain level controlled Vital Signs Assessment: post-procedure vital signs reviewed and stable Respiratory status: spontaneous breathing Cardiovascular status: stable Anesthetic complications: no    Last Vitals:  Vitals:   05/01/19 1637 05/01/19 1645  BP:  (!) 160/70  Pulse: 68   Resp: (!) 23 20  Temp:  36.9 C  SpO2: 93% 92%    Last Pain:  Vitals:   05/01/19 1645  TempSrc:   PainSc: 0-No pain                 Lewie Loron

## 2019-05-01 NOTE — Brief Op Note (Signed)
05/01/2019  3:16 PM  PATIENT:  [name]  69 y.o. female  PRE-OPERATIVE DIAGNOSIS:  TRIANGULAR FIBROCARTILAGE COMPLEX TEAR RIGHT WRIST  POST-OPERATIVE DIAGNOSIS:  TRIANGULAR FIBROCARTILAGE COMPLEX TEAR RIGHT WRIST  PROCEDURE:  Procedure(s): WRIST ARTHROSCOPY WITH DEBRIDEMENT VS REPAIR (Right)  SURGEON:  Surgeon(s) and Role:    Dairl Ponder, MD - Primary  PHYSICIAN ASSISTANT:   ASSISTANTS: none   ANESTHESIA:   regional  EBL:  2 mL   BLOOD ADMINISTERED:none  DRAINS: none   LOCAL MEDICATIONS USED:  NONE  SPECIMEN:  No Specimen  DISPOSITION OF SPECIMEN:  N/A  COUNTS:  YES  TOURNIQUET:   Total Tourniquet Time Documented: Upper Arm (Right) - 26 minutes Total: Upper Arm (Right) - 26 minutes   DICTATION: .Reubin Milan Dictation  PLAN OF CARE: Discharge to home after PACU  PATIENT DISPOSITION:  PACU - hemodynamically stable.   Delay start of Pharmacological VTE agent (>24hrs) due to surgical blood loss or risk of bleeding: not applicable

## 2019-05-01 NOTE — Op Note (Signed)
Patient was taken to the operating suite and after induction of adequate regional anesthetic and IV sedation the right upper extremity was prepped and draped in the usual sterile fashion.  An Esmarch was used to exsanguinate the limb and the tourniquet was inflated to 250 mmHg.  This point time an upper extremity was padded and placed in the wrist traction tower with 15 pounds of countertraction across the radiocarpal joint.  A standard 3 through 4 arthroscopic portal was established and visualization revealed intact radial side ligaments with a central TFCC tear and what appeared to be a grade 1 scapholunate interosseous ligament tear.  An 18-gauge needle was used to establish an outflow portal followed by 4 4 working portal once this is done using a suction shaver the TFC tear was debrided down to a stable rim.  Then we used an ArthroCare ablation device to coagulate the peripheral edges of the debridement.  We then placed the scope in the ulnar portal and directed dorsally and radially.  Through this portal we debrided the grade 1 scapholunate interosseous ligament tear using the same instrumentation.  1 final visualization of the joint revealed no other osteocartilaginous abnormalities.  The instruments removed and the portals.  They were loosely closed with 4-0 nylon.  We dressed with Xeroform, 4 x 4's, and a compressive dressing and palmar splint.  Patient tolerated this procedure well went to recovery in stable fashion.

## 2019-05-01 NOTE — Transfer of Care (Signed)
Immediate Anesthesia Transfer of Care Note  Patient: [name]  Procedure(s) Performed: WRIST ARTHROSCOPY WITH DEBRIDEMENT VS REPAIR (Right Wrist)  Patient Location: PACU  Anesthesia Type:MAC combined with regional for post-op pain  Level of Consciousness: awake, alert , oriented and patient cooperative  Airway & Oxygen Therapy: Patient Spontanous Breathing and Patient connected to nasal cannula oxygen  Post-op Assessment: Report given to RN, Post -op Vital signs reviewed and stable and Patient moving all extremities  Post vital signs: Reviewed and stable  Last Vitals:  Vitals Value Taken Time  BP    Temp    Pulse    Resp    SpO2      Last Pain:  Vitals:   05/01/19 1255  TempSrc: Oral  PainSc: 3       Patients Stated Pain Goal: 3 (85/02/77 4128)  Complications: No apparent anesthesia complications

## 2019-05-01 NOTE — H&P (Signed)
SKYE PLAMONDON is an 69 y.o. female.   Chief Complaint: Chronic right wrist ulnar pain HPI: Patient is a very pleasant 69 year old female with chronic right wrist pain and MRI documented TFCC tearing  Past Medical History:  Diagnosis Date  . A-fib (HCC)   . Anemia    hx of  . Arthritis   . Chest pain   . Constipation   . Depression   . Diabetes mellitus ORAL MEDS  . Diabetic retinopathy (HCC)   . Dyspnea    with minimal exertion-deconditioned  . Dyspnea   . Dysrhythmia    a-fib  . Food allergy   . GERD (gastroesophageal reflux disease)    occasional  . Gout   . History of kidney stones   . Hypercholesteremia   . Hyperlipidemia   . Hypertension   . Hypertensive kidney disease   . Hypothyroidism   . Knee pain, right   . 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 Obesity   . OSA on CPAP    uses CPAP nightly  . Osteoarthritis   . Palpitations   . Pinched nerve in neck   . Pleurisy   . Pneumonia    hx of  . PONV (postoperative nausea and vomiting)    for 3-4 days after general anesthesia approx 10-12 years ago  . Sciatica   . Swelling of knee joint, right   . Synovitis of knee RIGHT  . Trigger finger, left    left index  . Vitamin D deficiency     Past Surgical History:  Procedure Laterality Date  . CESAREAN SECTION  X3  . KNEE ARTHROSCOPY  04/20/2011   Procedure: ARTHROSCOPY KNEE;  Surgeon: Loanne Drilling, MD;  Location: [facility];  Service: Orthopedics;  Laterality: Right;  WITH SYNOVECTOMY  . LEFT CARPAL TUNNEL / LEFT MIDDLE & RING FINGER TRIGGER RELEASE  08-26-2008  . LEFT SHOULDER ARTHROSCOPY W/ DEBRIDEMENT  09-09-2003  . LEFT SHOULDER ARTHROSCOPY/ LEFT THUMB TRIGGER RELEASE  02-22-2005  . PHOTOCOAGULATION WITH LASER Right 03/20/2018   Procedure: PHOTOCOAGULATION WITH LASER;  Surgeon: Carmela Rima, MD;  Location: [facility] OR;  Service: Ophthalmology;  Laterality: Right;  . PULLEY RELEASE LEFT LONG FINGER  07-14-2009  . RADIAL HEAD  ARTHROPLASTY Right 06/17/2018   Procedure: RADIAL HEAD ARTHROPLASTY;  Surgeon: Bjorn Pippin, MD;  Location: [facility] OR;  Service: Orthopedics;  Laterality: Right;  . RIGHT CARPAL TUNNEL/ RIGHT THUMB TRIGGER RELEASE'S  11-28-2006  . RIGHT SHOULDER ARTHROSCOPY W/ ROTATOR CUFF REPAIR  01-13-2004  . SHOULDER ARTHROSCOPY DISTAL CLAVICLE EXCISION AND OPEN ROTATOR CUFF REPAIR  09-07-2004   LEFT  . SHOULDER ARTHROSCOPY W/ ACROMIAL REPAIR  11-29-2005   LEFT  . TONSILLECTOMY AND ADENOIDECTOMY  child  . TOTAL KNEE ARTHROPLASTY  12-14-2009   RIGHT  . TOTAL KNEE ARTHROPLASTY Left 11/05/2012   Procedure: LEFT TOTAL KNEE ARTHROPLASTY;  Surgeon: Loanne Drilling, MD;  Location: [facility];  Service: Orthopedics;  Laterality: Left;  . TOTAL KNEE REVISION Right 07/29/2016   Procedure: RIGHT KNEE POLY-LINER EXCHANGE;  Surgeon: Kathryne Hitch, MD;  Location: [facility];  Service: Orthopedics;  Laterality: Right;  . TRIGGER FINGER RELEASE Right 02/21/2013   Procedure: RIGHT RING A-1 PULLEY RELEASE    (MINOR PROCEDURE) ;  Surgeon: Wyn Forster., MD;  Location: [facility];  Service: Orthopedics;  Laterality: Right;  . TRIGGER FINGER RELEASE Left 09/22/2016   Procedure: RELEASE TRIGGER FINGER LEFT INDEX FINGER;  Surgeon:  Mcarthur Rossetti, MD;  Location: [place];  Service: Orthopedics;  Laterality: Left;  . TRIGGER FINGER RELEASE Right 01/20/2017   Procedure: RELEASE TRIGGER FINGER/A-1 PULLEY RIGHT INDEX FINGER;  Surgeon: Melrose Nakayama, MD;  Location: [place];  Service: Orthopedics;  Laterality: Right;  . VITRECTOMY 25 GAUGE WITH SCLERAL BUCKLE Right 03/20/2018   Procedure: RIGHT EYE VITRECTOMY WITH  ENDOLASER PARENTAL PHOTOCOAGULATION 25 GAUGE;  Surgeon: Jalene Mullet, MD;  Location: [employer] [facility];  Service: Ophthalmology;  Laterality: Right;    Family History  Problem Relation Age of Onset  . Diabetes Mother   . Hypertension Mother   . Heart attack Mother   . Thyroid disease Mother    . Obesity Mother   . Cancer Father   . Liver disease Father   . Sleep apnea Father   . Alcoholism Father    Social History:  reports that she quit smoking about 42 years ago. Her smoking use included cigarettes. She quit after 5.00 years of use. She has never used smokeless tobacco. She reports that she does not drink alcohol or use drugs.  Allergies:  Allergies  Allergen Reactions  . Actos [Pioglitazone Hydrochloride] Swelling and Other (See Comments)    Numbness in lips, HEADACHES SWELLING REACTION UNSPECIFIED   . Avandia [Rosiglitazone] Swelling    Numbness in lips, headaches  SWELLING REACTION UNSPECIFIED   . Tetanus Toxoids Other (See Comments)    Arm swelling, fainted, ?Respiratory distress  (horse serum)  . Food Nausea Only    Eggplant    Medications Prior to Admission  Medication Sig Dispense Refill  . Aflibercept (EYLEA) 2 MG/0.05ML SOLN 2 mg by Intravitreal route every 30 (thirty) days.    [name] amLODipine (NORVASC) 5 MG tablet Take 5 mg by mouth every evening.     [name] atorvastatin (LIPITOR) 40 MG tablet Take 40 mg by mouth every evening.     . calcium carbonate (CALCIUM 600) 600 MG TABS tablet Take 600 mg by mouth daily with breakfast.    . celecoxib (CELEBREX) 100 MG capsule Take 1 capsule (100 mg total) by mouth 2 (two) times daily. 60 capsule 2  . cholecalciferol (VITAMIN D3) 25 MCG (1000 UT) tablet Take 1,000 Units by mouth daily.    . DULoxetine (CYMBALTA) 60 MG capsule Take 60 mg by mouth daily.    . flecainide (TAMBOCOR) 50 MG tablet TAKE 1 TABLET BY MOUTH TWO TIMES DAILY (PLEASE KEEP MARCH APPOINTMENT) 180 tablet 3  . hydrochlorothiazide (MICROZIDE) 12.5 MG capsule Take 12.5 mg by mouth daily.   3  . HYDROcodone-acetaminophen (NORCO/VICODIN) 5-325 MG tablet TAKE 1 TABLET BY MOUTH EVERY 6 HOURS AS NEEDED FOR MODERATE PAIN 30 tablet 0  . insulin glargine (LANTUS) 100 UNIT/ML injection Inject 48 Units into the skin 2 (two) times daily.     . Levothyroxine Sodium 137  MCG CAPS Take 137 mcg by mouth daily before breakfast.     . Melatonin 5 MG CAPS Take 5-10 mg by mouth at bedtime as needed (sleep).     . metFORMIN (GLUCOPHAGE-XR) 500 MG 24 hr tablet Take 1,000 mg by mouth daily with supper.    . methocarbamol (ROBAXIN) 500 MG tablet TAKE 1 TABLET BY MOUTH EVERY 6 HOURS AS NEEDED FOR MUSCLE SPASMS 60 tablet 1  . metoprolol tartrate (LOPRESSOR) 50 MG tablet Take 1 tablet (50 mg total) by mouth 2 (two) times daily. 180 tablet 3  . omeprazole (PRILOSEC) 20 MG capsule Take 20 mg by mouth daily.     [name]  TRULICITY 3 MG/0.5ML SOPN Inject 3 mg as directed every Sunday.   11  . valsartan (DIOVAN) 320 MG tablet Take 320 mg by mouth daily.   3  . rivaroxaban (XARELTO) 20 MG TABS tablet TAKE 1 TABLET BY MOUTH ONCE DAILY WITH SUPPER (Patient taking differently: Take 20 mg by mouth daily with supper. TAKE 1 TABLET BY MOUTH ONCE DAILY WITH SUPPER) 90 tablet 3    Results for orders placed or performed during the hospital encounter of 05/01/19 (from the past 48 hour(s))  Glucose, capillary     Status: None   Collection Time: 05/01/19  1:09 PM  Result Value Ref Range   Glucose-Capillary 92 70 - 99 mg/dL   No results found.  Review of Systems  All other systems reviewed and are negative.   Blood pressure (!) 150/68, pulse 65, temperature 99.3 F (37.4 C), temperature source Oral, resp. rate 20, height 5\' 2"  (1.575 m), weight 117.4 kg, SpO2 97 %. Physical Exam  Constitutional: She is oriented to person, place, and time. She appears well-developed and well-nourished.  HENT:  Head: Normocephalic and atraumatic.  Cardiovascular: Normal rate.  Respiratory: Effort normal.  Musculoskeletal:     Right wrist: Tenderness present.     Cervical back: Normal range of motion.     Comments: Right wrist ulnar-sided wrist pain made worse with pronation and supination as well as ulnar deviation  Neurological: She is alert and oriented to person, place, and time.  Skin: Skin is warm.   Psychiatric: She has a normal mood and affect. Her behavior is normal. Judgment and thought content normal.     Assessment/Plan 69 year old female with chronic right wrist pain and MRI documented TFCC tearing.  Have discussed the role of arthroscopic examination of the right wrist with TFCC debridement as necessary.  Patient understands risks and benefits and wished to proceed  64, MD 05/01/2019, 1:45 PM

## 2019-05-01 NOTE — Anesthesia Preprocedure Evaluation (Addendum)
Anesthesia Evaluation  Patient identified by MRN, date of birth, ID band Patient awake    Reviewed: Allergy & Precautions, NPO status , Patient's Chart, lab work & pertinent test results, reviewed documented beta blocker date and time   History of Anesthesia Complications (+) PONV  Airway Mallampati: III  TM Distance: >3 FB Neck ROM: Full    Dental no notable dental hx. (+) Teeth Intact, Dental Advisory Given   Pulmonary shortness of breath and with exertion, sleep apnea and Continuous Positive Airway Pressure Ventilation , former smoker,    Pulmonary exam normal breath sounds clear to auscultation       Cardiovascular hypertension, Pt. on medications and Pt. on home beta blockers Normal cardiovascular exam+ dysrhythmias Atrial Fibrillation  Rhythm:Regular Rate:Normal  HLD  TTE 2019 EF 60-65%, G1DD, no significant valvular abnormalities  Stress Test 2018 Nuclear stress EF: 64%. No wall motion abnormalities. There was no ST segment deviation noted during stress. This is a low risk study. No ischemia identified. Perfusion is suggestive of left ventricular hypertrophy.   Neuro/Psych PSYCHIATRIC DISORDERS Depression negative neurological ROS     GI/Hepatic Neg liver ROS, GERD  ,  Endo/Other  diabetes, Type 2, Insulin Dependent, Oral Hypoglycemic AgentsHypothyroidism Morbid obesity (BMI 44)  Renal/GU Renal InsufficiencyRenal disease (Cr 1.23, K 4.3)  negative genitourinary   Musculoskeletal  (+) Arthritis ,   Abdominal   Peds  Hematology  (+) Blood dyscrasia (on xarelto, last dose 04/28/19), ,   Anesthesia Other Findings   Reproductive/Obstetrics                         Anesthesia Physical Anesthesia Plan  ASA: III  Anesthesia Plan: MAC and Regional   Post-op Pain Management:  Regional for Post-op pain   Induction: Intravenous  PONV Risk Score and Plan: 3 and Propofol infusion,  Treatment may vary due to age or medical condition, Midazolam and Ondansetron  Airway Management Planned: Natural Airway and Simple Face Mask  Additional Equipment:   Intra-op Plan:   Post-operative Plan:   Informed Consent: I have reviewed the patients History and Physical, chart, labs and discussed the procedure including the risks, benefits and alternatives for the proposed anesthesia with the patient or authorized representative who has indicated his/her understanding and acceptance.     Dental advisory given  Plan Discussed with: CRNA  Anesthesia Plan Comments:        Anesthesia Quick Evaluation

## 2019-05-02 ENCOUNTER — Encounter: Payer: Self-pay | Admitting: *Deleted

## 2019-05-02 MED ORDER — SODIUM CHLORIDE 0.9 % IR SOLN
Status: DC | PRN
  Administered 2019-05-01: 1000 mL

## 2019-05-02 NOTE — Addendum Note (Signed)
Addendum  created 05/02/19 1347 by Lance Coon, CRNA   Charge Capture section accepted, Flowsheet accepted, Intraprocedure Flowsheets edited

## 2019-05-08 DIAGNOSIS — G4733 Obstructive sleep apnea (adult) (pediatric): Secondary | ICD-10-CM | POA: Diagnosis not present

## 2019-05-13 DIAGNOSIS — Z794 Long term (current) use of insulin: Secondary | ICD-10-CM | POA: Diagnosis not present

## 2019-05-13 DIAGNOSIS — E11319 Type 2 diabetes mellitus with unspecified diabetic retinopathy without macular edema: Secondary | ICD-10-CM | POA: Diagnosis not present

## 2019-05-13 DIAGNOSIS — E039 Hypothyroidism, unspecified: Secondary | ICD-10-CM | POA: Diagnosis not present

## 2019-05-13 DIAGNOSIS — Z5181 Encounter for therapeutic drug level monitoring: Secondary | ICD-10-CM | POA: Diagnosis not present

## 2019-05-16 DIAGNOSIS — E113511 Type 2 diabetes mellitus with proliferative diabetic retinopathy with macular edema, right eye: Secondary | ICD-10-CM | POA: Diagnosis not present

## 2019-05-23 MED FILL — DULOXETINE HCL 60 MG CPEP: 60 | 30 days supply | Qty: 30 | Fill #9

## 2019-05-23 MED FILL — FLECAINIDE ACETATE 50 MG TA: 50 | 90 days supply | Qty: 180 | Fill #3

## 2019-05-23 MED FILL — LOSARTAN POTASSIUM 100 MG T: 100 | 90 days supply | Qty: 90 | Fill #1

## 2019-05-23 MED FILL — metFORMIN HCL ER 500 MG TB2: 500 | 90 days supply | Qty: 180 | Fill #2

## 2019-05-28 MED FILL — TRULICITY 3 MG/0.5ML SOPN: 3 | 28 days supply | Qty: 2 | Fill #4

## 2019-06-06 ENCOUNTER — Other Ambulatory Visit: Payer: Self-pay

## 2019-06-06 ENCOUNTER — Ambulatory Visit: Payer: 59 | Attending: Orthopedic Surgery | Admitting: Occupational Therapy

## 2019-06-06 DIAGNOSIS — M25531 Pain in right wrist: Secondary | ICD-10-CM | POA: Diagnosis not present

## 2019-06-06 DIAGNOSIS — M25631 Stiffness of right wrist, not elsewhere classified: Secondary | ICD-10-CM | POA: Diagnosis not present

## 2019-06-06 DIAGNOSIS — M6281 Muscle weakness (generalized): Secondary | ICD-10-CM | POA: Insufficient documentation

## 2019-06-06 NOTE — Therapy (Signed)
[facility] [address] [place], [place], [address] Phone: [phone]   Fax:  [phone]  Occupational Therapy Evaluation  Patient Details  Name: [name] MRN: [id number] Date of Birth: 26-Aug-1950 Referring Provider (OT): Dr. Mina Marble   Encounter Date: 06/06/2019  OT End of Session - 06/06/19 1849    Visit Number  1    Number of Visits  9    Date for OT Re-Evaluation  07/07/19    Authorization Type  [facility] - Clinical eligibility needed after 25 visits    OT Start Time  1615    OT Stop Time  1700    OT Time Calculation (min)  45 min    Activity Tolerance  Patient tolerated treatment well    Behavior During Therapy  [facility] for tasks assessed/performed       Past Medical History:  Diagnosis Date  . A-fib (HCC)   . Anemia    hx of  . Arthritis   . Chest pain   . Constipation   . Depression   . Diabetes mellitus [facility]AL [facility]S  . Diabetic retinopathy (HCC)   . Dyspnea    with minimal exertion-deconditioned  . Dyspnea   . Dysrhythmia    a-fib  . Food allergy   . GERD (gastroesophageal reflux disease)    occasional  . Gout   . History of kidney stones   . Hypercholesteremia   . Hyperlipidemia   . Hypertension   . Hypertensive kidney disease   . Hypothyroidism   . Knee pain, right   . 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 Obesity   . OSA on CPAP    uses CPAP nightly  . Osteoarthritis   . Palpitations   . Pinched nerve in neck   . Pleurisy   . Pneumonia    hx of  . PONV (postoperative nausea and vomiting)    for 3-4 days after general anesthesia approx 10-12 years ago  . Sciatica   . Swelling of knee joint, right   . Synovitis of knee RIGHT  . Trigger finger, left    left index  . Vitamin D deficiency     Past Surgical History:  Procedure Laterality Date  . CESAREAN SECTION  X3  . KNEE ARTHROSCOPY  04/20/2011   Procedure: ARTHROSCOPY KNEE;  Surgeon: Loanne Drilling, MD;   Location: [facility];  Service: Orthopedics;  Laterality: Right;  WITH SYNOVECTOMY  . LEFT CARPAL TUNNEL / LEFT MIDDLE & RING FINGER TRIGGER RELEASE  08-26-2008  . LEFT SHOULDER ARTHROSCOPY W/ DEBRIDEMENT  09-09-2003  . LEFT SHOULDER ARTHROSCOPY/ LEFT THUMB TRIGGER RELEASE  02-22-2005  . PHOTOCOAGULATION WITH LASER Right 03/20/2018   Procedure: PHOTOCOAGULATION WITH LASER;  Surgeon: Carmela Rima, MD;  [facility];  Service: Ophthalmology;  Laterality: Right;  . PULLEY RELEASE LEFT LONG FINGER  07-14-2009  . RADIAL HEAD ARTHROPLASTY Right 06/17/2018   Procedure: RADIAL HEAD ARTHROPLASTY;  Surgeon: Bjorn Pippin, MD;  [facility];  Service: Orthopedics;  Laterality: Right;  . RIGHT CARPAL TUNNEL/ RIGHT THUMB TRIGGER RELEASE'S  11-28-2006  . RIGHT SHOULDER ARTHROSCOPY W/ ROTATOR CUFF REPAIR  01-13-2004  . SHOULDER ARTHROSCOPY DISTAL CLAVICLE EXCISION AND OPEN ROTATOR CUFF REPAIR  09-07-2004   LEFT  . SHOULDER ARTHROSCOPY W/ ACROMIAL REPAIR  11-29-2005   LEFT  . TONSILLECTOMY AND ADENOIDECTOMY  child  . TOTAL KNEE ARTHROPLASTY  12-14-2009   RIGHT  . TOTAL KNEE ARTHROPLASTY  Left 11/05/2012   Procedure: LEFT TOTAL KNEE ARTHROPLASTY;  Surgeon: Loanne Drilling, MD;  Location: [facility];  Service: Orthopedics;  Laterality: Left;  . TOTAL KNEE REVISION Right 07/29/2016   Procedure: RIGHT KNEE POLY-LINER EXCHANGE;  Surgeon: Kathryne Hitch, MD;  Location: [facility];  Service: Orthopedics;  Laterality: Right;  . TRIGGER FINGER RELEASE Right 02/21/2013   Procedure: RIGHT RING A-1 PULLEY RELEASE    (MINOR PROCEDURE) ;  Surgeon: Wyn Forster., MD;  Location: [facility];  Service: Orthopedics;  Laterality: Right;  . TRIGGER FINGER RELEASE Left 09/22/2016   Procedure: RELEASE TRIGGER FINGER LEFT INDEX FINGER;  Surgeon: Kathryne Hitch, MD;  [facility];  Service: Orthopedics;  Laterality: Left;  . TRIGGER FINGER RELEASE Right 01/20/2017   Procedure:  RELEASE TRIGGER FINGER/A-1 PULLEY RIGHT INDEX FINGER;  Surgeon: Marcene Corning, MD;  Location: [facility];  Service: Orthopedics;  Laterality: Right;  . VITRECTOMY 25 GAUGE WITH SCLERAL BUCKLE Right 03/20/2018   Procedure: RIGHT EYE VITRECTOMY WITH  ENDOLASER PARENTAL PHOTOCOAGULATION 25 GAUGE;  Surgeon: Carmela Rima, MD;  Location: [facility] OR;  Service: Ophthalmology;  Laterality: Right;  . WRIST ARTHROSCOPY WITH DEBRIDEMENT Right 05/01/2019   Procedure: WRIST ARTHROSCOPY WITH DEBRIDEMENT;  Surgeon: Dairl Ponder, MD;  Location: [facility];  Service: Orthopedics;  Laterality: Right;    There were no vitals filed for this visit.  Subjective Assessment - 06/06/19 1623    Pertinent History  s/p TFCC debridement 05/01/19, SL ligament fraying secondary to Triad injury to Rt elbow in April 2020 from fall. DM Type II, HTN    Limitations  light duty, NO lifting > 10 lbs, brace on all the time at work    Special Tests  decrease pain, back to using Rt hand and able to lift things    Patient Stated Goals  decrease pain    Currently in Pain?  Yes    Pain Score  1    up to 9/10   Pain Location  Wrist    Pain Orientation  Right    Pain Descriptors / Indicators  Sharp    Pain Type  Chronic pain    Pain Onset  More than a month ago    Pain Frequency  Intermittent    Aggravating Factors   turning door handles, lifting, wt bearing through hands    Pain Relieving Factors  brace, rest, ice, OTC meds        [facility] OT Assessment - 06/06/19 0001      Assessment   Medical Diagnosis  s/p TFCC debridement   also SL ligament fraying   Referring Provider (OT)  Dr. Mina Marble    Onset Date/Surgical Date  05/01/19    Hand Dominance  Right    Prior Therapy  Therapy for elbow      Precautions   Precautions  Other (comment)    Precaution Comments  light duty at work, no lifting > 10 lbs, brace on at work      Restrictions   Weight Bearing Restrictions  Yes    RUE Weight Bearing   Non weight bearing      Balance Screen   Has the patient fallen in the past 6 months  No      Home  Environment   Bathroom Risk manager    Additional Comments  Pt lives in 1 story home, 5 steps to enter.     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, Estate agent, occasionally pushing w/c to transport pt - latter pt is not currently doing    Leisure  crafting      ADL   Eating/Feeding  Independent    Grooming  Independent    Upper Body Bathing  Independent    Lower Body Bathing  Independent    Upper Body Dressing  Increased time   for bra (d/t shoulder)   Lower Body Dressing  Independent    Sales executive -  Control and instrumentation engineer  Independent    ADL comments  Pain with peeling, cutting veggies      IADL   Shopping  Takes care of all shopping needs independently   heavier groceries in Lt arm   Light Housekeeping  Does personal laundry completely    Meal Prep  Plans, prepares and serves adequate meals independently   modifies tasks d/t pain, lifts pots w/ Lt Film/video editor own vehicle    Medication Management  Is responsible for taking medication in correct dosages at correct time      Mobility   Mobility Status  Independent      Written Expression   Dominant Hand  Right    Handwriting  --   more painful, pt reports handwriting has changes      Vision - History   Baseline Vision  Wears contact    Visual History  Cataracts   Rt eye, diabetic retinopathy     Observation/Other Assessments   Observations  Pt with triad injury and repair April 2020 which made wrist worse due to shortening of ulna, dorsal wrist incision from recent surgery      Sensation   Light Touch  Appears Intact       Coordination   9 Hole Peg Test  Right;Left    Right 9 Hole Peg Test  25.81 SEC      Edema   Edema  no access fluid   joint shifting at carpals)      ROM / Strength   AROM / PROM / Strength  AROM      AROM   Overall AROM Comments  BUE AROM WNL's except Rt wrist: flex = 75*, ext = 50*, RD = 25*, UD = 30*      Hand Function   Right Hand Grip (lbs)  36    Right Hand Lateral Pinch  14 lbs    Right Hand 3 Point Pinch  12 lbs    Left Hand Grip (lbs)  57    Left Hand Lateral Pinch  18 lbs    Left 3 point pinch  15 lbs                           OT Long Term Goals - 06/06/19 1855      OT LONG TERM GOAL #1   Title  Independent with HEP for Rt wrist and hand    Time  4    Period  Weeks    Status  New      OT LONG TERM GOAL #2   Title  Pt to verbalize understanding with A/E and task modifications to increase  ease and decrease pain with functional tasks    Time  4    Period  Weeks    Status  New      OT LONG TERM GOAL #3   Title  Grip strength Rt hand to increase to 46 lbs    Baseline  36 lbs    Time  4    Period  Weeks    Status  New      OT LONG TERM GOAL #4   Title  Lateral pinch to increase to 16 lbs Rt hand    Baseline  14 lbs    Time  4    Period  Weeks    Status  New      OT LONG TERM GOAL #5   Title  Pain overall to be 5/10 or under with all functional tasks using compensatory strategies/task modifications prn    Time  4    Period  Weeks    Status  New            Plan - 06/06/19 1851    Clinical Impression Statement  Pt is a 69 y.o. female who presents to OPOT s/p TFCC debridement on 05/01/19. In addition to TFCC tear, pt also has SL ligament fraying. Pt had fallen last April 2020 with triad injury and repair to Rt elbow which caused wrist problems leading up to this per pt report. Pt now presents with pain, decreased ROM in wrist ext and UD, and decreased grip and pinch strength Rt hand. Pt would benefit from OT to address these deficits  and increase ease with functional tasks.    OT Occupational Profile and History  Problem Focused Assessment - Including review of records relating to presenting problem    Occupational performance deficits (Please refer to evaluation for details):  ADL's;IADL's;Work;Leisure    Body Structure / Function / Physical Skills  ADL;ROM;IADL;Edema;Body mechanics;Flexibility;Strength;Coordination;Pain;UE functional use    Rehab Potential  Good    Clinical Decision Making  Limited treatment options, no task modification necessary    Comorbidities Affecting Occupational Performance:  May have comorbidities impacting occupational performance    Modification or Assistance to Complete Evaluation   No modification of tasks or assist necessary to complete eval    OT Frequency  2x / week    OT Duration  4 weeks   or 8 visits over 8 weeks   OT Treatment/Interventions  Self-care/ADL training;Therapeutic exercise;Ultrasound;Manual Therapy;Splinting;Iontophoresis;Therapeutic activities;Cryotherapy;Paraffin;DME and/or AE instruction;Compression bandaging;Fluidtherapy;Moist Heat;Passive range of motion;Patient/family education    Plan  Isometric ex's for wrist    Consulted and Agree with Plan of Care  Patient       Patient will benefit from skilled therapeutic intervention in order to improve the following deficits and impairments:   Body Structure / Function / Physical Skills: ADL, ROM, IADL, Edema, Body mechanics, Flexibility, Strength, Coordination, Pain, UE functional use       Visit Diagnosis: Pain in right wrist  Muscle weakness (generalized)  Stiffness of right wrist, not elsewhere classified    Problem List Patient Active Problem List   Diagnosis Date Noted  . Radial head fracture, closed 06/16/2018  . Closed fracture dislocation of elbow 06/16/2018  . Other fatigue 07/18/2017  . Shortness of breath on exertion 07/18/2017  . Type 2 diabetes mellitus with retinopathy, with long-term current  use of insulin (HCC) 07/18/2017  . Vitamin D deficiency 07/18/2017  . B12 nutritional deficiency 07/18/2017  . Other specified hypothyroidism 07/18/2017  . Acute pain of right  knee 12/22/2016  . Trigger index finger of left hand 09/22/2016  . 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 Diabetes (HCC) 01/08/2014  . Essential hypertension 01/08/2014  . Hyperlipidemia 01/08/2014  . Postoperative anemia due to acute blood loss 11/22/2012  . Hyponatremia 11/06/2012  . OA (osteoarthritis) of knee 11/05/2012  . Villonodular synovitis of knee 04/20/2011    Kelli ChurnBallie, Rayshad Riviello Johnson, OTR/L 06/06/2019, 6:59 PM  [facility] [address] [place], [place], [address] Phone: [phone]   Fax:  [phone]  Name: [name] MRN: [id number] Date of Birth: 19-Jan-1951

## 2019-06-11 ENCOUNTER — Ambulatory Visit: Payer: 59 | Admitting: Occupational Therapy

## 2019-06-11 ENCOUNTER — Encounter: Payer: Self-pay | Admitting: Occupational Therapy

## 2019-06-11 ENCOUNTER — Other Ambulatory Visit: Payer: Self-pay

## 2019-06-11 DIAGNOSIS — M25531 Pain in right wrist: Secondary | ICD-10-CM

## 2019-06-11 DIAGNOSIS — M6281 Muscle weakness (generalized): Secondary | ICD-10-CM | POA: Diagnosis not present

## 2019-06-11 DIAGNOSIS — M25631 Stiffness of right wrist, not elsewhere classified: Secondary | ICD-10-CM

## 2019-06-11 NOTE — Therapy (Signed)
[facility] [address] [place], [place], [address] Phone: [phone]   Fax:  [phone]  Occupational Therapy Treatment  Patient Details  Name: [name] MRN: [id number] Date of Birth: 02/13/1951 Referring Provider (OT): Dr. Mina Marble   Encounter Date: 06/11/2019  OT End of Session - 06/11/19 1638    Visit Number  2    Number of Visits  9    Date for OT Re-Evaluation  07/07/19    Authorization Type  [facility] - Clinical eligibility needed after 25 visits    OT Start Time  1545    OT Stop Time  1635    OT Time Calculation (min)  50 min       Past Medical History:  Diagnosis Date  . A-fib (HCC)   . Anemia    hx of  . Arthritis   . Chest pain   . Constipation   . Depression   . Diabetes mellitus [facility]AL [facility]S  . Diabetic retinopathy (HCC)   . Dyspnea    with minimal exertion-deconditioned  . Dyspnea   . Dysrhythmia    a-fib  . Food allergy   . GERD (gastroesophageal reflux disease)    occasional  . Gout   . History of kidney stones   . Hypercholesteremia   . Hyperlipidemia   . Hypertension   . Hypertensive kidney disease   . Hypothyroidism   . Knee pain, right   . 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 Obesity   . OSA on CPAP    uses CPAP nightly  . Osteoarthritis   . Palpitations   . Pinched nerve in neck   . Pleurisy   . Pneumonia    hx of  . PONV (postoperative nausea and vomiting)    for 3-4 days after general anesthesia approx 10-12 years ago  . Sciatica   . Swelling of knee joint, right   . Synovitis of knee RIGHT  . Trigger finger, left    left index  . Vitamin D deficiency     Past Surgical History:  Procedure Laterality Date  . CESAREAN SECTION  X3  . KNEE ARTHROSCOPY  04/20/2011   Procedure: ARTHROSCOPY KNEE;  Surgeon: Loanne Drilling, MD;  Location: [facility];  Service: Orthopedics;  Laterality: Right;  WITH SYNOVECTOMY  . LEFT CARPAL TUNNEL /  LEFT MIDDLE & RING FINGER TRIGGER RELEASE  08-26-2008  . LEFT SHOULDER ARTHROSCOPY W/ DEBRIDEMENT  09-09-2003  . LEFT SHOULDER ARTHROSCOPY/ LEFT THUMB TRIGGER RELEASE  02-22-2005  . PHOTOCOAGULATION WITH LASER Right 03/20/2018   Procedure: PHOTOCOAGULATION WITH LASER;  Surgeon: Carmela Rima, MD;  [facility]: [facility];  Service: Ophthalmology;  Laterality: Right;  . PULLEY RELEASE LEFT LONG FINGER  07-14-2009  . RADIAL HEAD ARTHROPLASTY Right 06/17/2018   Procedure: RADIAL HEAD ARTHROPLASTY;  Surgeon: Bjorn Pippin, MD;  [facility];  Service: Orthopedics;  Laterality: Right;  . RIGHT CARPAL TUNNEL/ RIGHT THUMB TRIGGER RELEASE'S  11-28-2006  . RIGHT SHOULDER ARTHROSCOPY W/ ROTATOR CUFF REPAIR  01-13-2004  . SHOULDER ARTHROSCOPY DISTAL CLAVICLE EXCISION AND OPEN ROTATOR CUFF REPAIR  09-07-2004   LEFT  . SHOULDER ARTHROSCOPY W/ ACROMIAL REPAIR  11-29-2005   LEFT  . TONSILLECTOMY AND ADENOIDECTOMY  child  . TOTAL KNEE ARTHROPLASTY  12-14-2009   RIGHT  . TOTAL KNEE ARTHROPLASTY Left 11/05/2012   Procedure: LEFT TOTAL KNEE ARTHROPLASTY;  Surgeon: Loanne Drilling, MD;  Location: [facility];  Service:  Orthopedics;  Laterality: Left;  . TOTAL KNEE REVISION Right 07/29/2016   Procedure: RIGHT KNEE POLY-LINER EXCHANGE;  Surgeon: Kathryne Hitch, MD;  Location: [facility];  Service: Orthopedics;  Laterality: Right;  . TRIGGER FINGER RELEASE Right 02/21/2013   Procedure: RIGHT RING A-1 PULLEY RELEASE    (MINOR PROCEDURE) ;  Surgeon: Wyn Forster., MD;  Location: [facility];  Service: Orthopedics;  Laterality: Right;  . TRIGGER FINGER RELEASE Left 09/22/2016   Procedure: RELEASE TRIGGER FINGER LEFT INDEX FINGER;  Surgeon: Kathryne Hitch, MD;  Location[facility];  Service: Orthopedics;  Laterality: Left;  . TRIGGER FINGER RELEASE Right 01/20/2017   Procedure: RELEASE TRIGGER FINGER/A-1 PULLEY RIGHT INDEX FINGER;  Surgeon: Marcene Corning, MD;  Location: [facility];   Service: Orthopedics;  Laterality: Right;  . VITRECTOMY 25 GAUGE WITH SCLERAL BUCKLE Right 03/20/2018   Procedure: RIGHT EYE VITRECTOMY WITH  ENDOLASER PARENTAL PHOTOCOAGULATION 25 GAUGE;  Surgeon: Carmela Rima, MD;  Location: [facility];  Service: Ophthalmology;  Laterality: Right;  . WRIST ARTHROSCOPY WITH DEBRIDEMENT Right 05/01/2019   Procedure: WRIST ARTHROSCOPY WITH DEBRIDEMENT;  Surgeon: Dairl Ponder, MD;  Location: [facility];  Service: Orthopedics;  Laterality: Right;    There were no vitals filed for this visit.  Subjective Assessment - 06/11/19 1557    Subjective   I have stopped pushing wheelchairs at work because it was hurting    Currently in Pain?  Yes    Pain Score  3     Pain Location  Wrist    Pain Orientation  Right    Pain Descriptors / Indicators  Aching    Pain Type  Chronic pain    Pain Onset  More than a month ago    Pain Frequency  Intermittent    Aggravating Factors   use    Pain Relieving Factors  brace, rest, ice, OTC meds                   OT Treatments/Exercises (OP) - 06/11/19 0001      ADLs   ADL Comments  Reviewed goals for this episode of care      Wrist Exercises   Other wrist exercises  Isometric wrist exercises -see patient instructions    Other wrist exercises  forearm gymto address coordination and active gentle motion in right wrist and forearm.       Cryotherapy   Number Minutes Cryotherapy  10 Minutes    Cryotherapy Location  Wrist    Type of Cryotherapy  Ice pack             OT Education - 06/11/19 1637    Education Details  isometric exercises for right wrist, ice for pain relief    Person(s) Educated  Patient    Methods  Explanation;Demonstration;Tactile cues;Verbal cues;Handout    Comprehension  Verbalized understanding;Returned demonstration          OT Long Term Goals - 06/06/19 1855      OT LONG TERM GOAL #1   Title  Independent with HEP for Rt wrist and hand    Time  4    Period   Weeks    Status  New      OT LONG TERM GOAL #2   Title  Pt to verbalize understanding with A/E and task modifications to increase ease and decrease pain with functional tasks    Time  4    Period  Weeks  Status  New      OT LONG TERM GOAL #3   Title  Grip strength Rt hand to increase to 46 lbs    Baseline  36 lbs    Time  4    Period  Weeks    Status  New      OT LONG TERM GOAL #4   Title  Lateral pinch to increase to 16 lbs Rt hand    Baseline  14 lbs    Time  4    Period  Weeks    Status  New      OT LONG TERM GOAL #5   Title  Pain overall to be 5/10 or under with all functional tasks using compensatory strategies/task modifications prn    Time  4    Period  Weeks    Status  New            Plan - 06/11/19 1642    Clinical Impression Statement  Patient is agreeable to OT goals, and is working full time.  Discussed the benefit of using ice throughout her day at work if possible to help with pain management as she is hesitant to take anti-inflammatory medications routinely!    OT Frequency  2x / week    OT Duration  4 weeks    OT Treatment/Interventions  Self-care/ADL training;Therapeutic exercise;Ultrasound;Manual Therapy;Splinting;Iontophoresis;Therapeutic activities;Cryotherapy;Paraffin;DME and/or AE instruction;Compression bandaging;Fluidtherapy;Moist Heat;Passive range of motion;Patient/family education    Plan  AROM Wrist, review HEP, Modalities as needed - does well with ice    Consulted and Agree with Plan of Care  Patient       Patient will benefit from skilled therapeutic intervention in order to improve the following deficits and impairments:           Visit Diagnosis: Pain in right wrist  Muscle weakness (generalized)  Stiffness of right wrist, not elsewhere classified    Problem List Patient Active Problem List   Diagnosis Date Noted  . Radial head fracture, closed 06/16/2018  . Closed fracture dislocation of elbow 06/16/2018  . Other  fatigue 07/18/2017  . Shortness of breath on exertion 07/18/2017  . Type 2 diabetes mellitus with retinopathy, with long-term current use of insulin ([place]) 07/18/2017  . Vitamin D deficiency 07/18/2017  . B12 nutritional deficiency 07/18/2017  . Other specified hypothyroidism 07/18/2017  . Acute pain of right knee 12/22/2016  . Trigger index finger of left hand 09/22/2016  . 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 Diabetes ([place]) 01/08/2014  . Essential hypertension 01/08/2014  . Hyperlipidemia 01/08/2014  . Postoperative anemia due to acute blood loss 11/22/2012  . Hyponatremia 11/06/2012  . OA (osteoarthritis) of knee 11/05/2012  . Villonodular synovitis of knee 04/20/2011    Marlowe Sax M,OTR/L 06/11/2019, 6:05 PM  [place] [address] [place], [place], [address] Phone: [phone]   Fax:  [phone]  Name: [name] MRN: [id number] Date of Birth: 1950-03-16

## 2019-06-11 NOTE — Patient Instructions (Signed)
Isometric exercise of your wrist.  1)  Wrist extension - let right palm rest on table, and with left hand apply gentle pressure to the top of hand - you will activate the muscles to lift your palm without moving it from the surface  2) Wrist flexion - let right back of hand rest on table, and with left hand in palm provide resistance to wrist bending without bending wrist  3)  Radial deviation - with pinky side of hand down on table, apply pressure to thumb side of right hand without moving right wrist  4) Ulnar deviation - with left hand under pinky side of right hand press right hand into left hand without moving right wrist

## 2019-06-18 ENCOUNTER — Encounter: Payer: Self-pay | Admitting: Occupational Therapy

## 2019-06-18 ENCOUNTER — Other Ambulatory Visit: Payer: Self-pay | Admitting: Cardiovascular Disease

## 2019-06-18 ENCOUNTER — Ambulatory Visit: Payer: 59 | Attending: Orthopedic Surgery | Admitting: Occupational Therapy

## 2019-06-18 ENCOUNTER — Other Ambulatory Visit: Payer: Self-pay

## 2019-06-18 DIAGNOSIS — M25521 Pain in right elbow: Secondary | ICD-10-CM | POA: Diagnosis not present

## 2019-06-18 DIAGNOSIS — M25631 Stiffness of right wrist, not elsewhere classified: Secondary | ICD-10-CM | POA: Diagnosis not present

## 2019-06-18 DIAGNOSIS — M6281 Muscle weakness (generalized): Secondary | ICD-10-CM | POA: Diagnosis not present

## 2019-06-18 DIAGNOSIS — M25531 Pain in right wrist: Secondary | ICD-10-CM | POA: Diagnosis not present

## 2019-06-18 MED FILL — XARELTO 20 MG TABLET: 20 | 90 days supply | Qty: 90 | Fill #0

## 2019-06-18 NOTE — Therapy (Signed)
[facility] [address] [place], [place], [address] Phone: [phone]   Fax:  [phone]  Occupational Therapy Treatment  Patient Details  Name: [name] MRN: [id number] Date of Birth: [birth year] Referring Provider (OT): Dr. Mina Marble   Encounter Date: 06/18/2019  OT End of Session - 06/18/19 1608    Visit Number  3    Number of Visits  9    Date for OT Re-Evaluation  07/07/19    Authorization Type  [facility] [employer] - Clinical eligibility needed after 25 visits    OT Start Time  1400    OT Stop Time  1450    OT Time Calculation (min)  50 min    Activity Tolerance  Patient tolerated treatment well    Behavior During Therapy  [facility] for tasks assessed/performed       Past Medical History:  Diagnosis Date  . A-fib (HCC)   . Anemia    hx of  . Arthritis   . Chest pain   . Constipation   . Depression   . Diabetes mellitus ORAL MEDS  . Diabetic retinopathy (HCC)   . Dyspnea    with minimal exertion-deconditioned  . Dyspnea   . Dysrhythmia    a-fib  . Food allergy   . GERD (gastroesophageal reflux disease)    occasional  . Gout   . History of kidney stones   . Hypercholesteremia   . Hyperlipidemia   . Hypertension   . Hypertensive kidney disease   . Hypothyroidism   . Knee pain, right   . 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 Obesity   . OSA on CPAP    uses CPAP nightly  . Osteoarthritis   . Palpitations   . Pinched nerve in neck   . Pleurisy   . Pneumonia    hx of  . PONV (postoperative nausea and vomiting)    for 3-4 days after general anesthesia approx 10-12 years ago  . Sciatica   . Swelling of knee joint, right   . Synovitis of knee RIGHT  . Trigger finger, left    left index  . Vitamin D deficiency     Past Surgical History:  Procedure Laterality Date  . CESAREAN SECTION  X3  . KNEE ARTHROSCOPY  04/20/2011   Procedure: ARTHROSCOPY KNEE;  Surgeon: Loanne Drilling, MD;   Location: [facility];  Service: Orthopedics;  Laterality: Right;  WITH SYNOVECTOMY  . LEFT CARPAL TUNNEL / LEFT MIDDLE & RING FINGER TRIGGER RELEASE  08-26-2008  . LEFT SHOULDER ARTHROSCOPY W/ DEBRIDEMENT  09-09-2003  . LEFT SHOULDER ARTHROSCOPY/ LEFT THUMB TRIGGER RELEASE  02-22-2005  . PHOTOCOAGULATION WITH LASER Right 03/20/2018   Procedure: PHOTOCOAGULATION WITH LASER;  Surgeon: Carmela Rima, MD;  Location: [facility] OR;  Service: Ophthalmology;  Laterality: Right;  . PULLEY RELEASE LEFT LONG FINGER  07-14-2009  . RADIAL HEAD ARTHROPLASTY Right 06/17/2018   Procedure: RADIAL HEAD ARTHROPLASTY;  Surgeon: Bjorn Pippin, MD;  [facility] OR;  Service: Orthopedics;  Laterality: Right;  . RIGHT CARPAL TUNNEL/ RIGHT THUMB TRIGGER RELEASE'S  11-28-2006  . RIGHT SHOULDER ARTHROSCOPY W/ ROTATOR CUFF REPAIR  01-13-2004  . SHOULDER ARTHROSCOPY DISTAL CLAVICLE EXCISION AND OPEN ROTATOR CUFF REPAIR  09-07-2004   LEFT  . SHOULDER ARTHROSCOPY W/ ACROMIAL REPAIR  11-29-2005   LEFT  . TONSILLECTOMY AND ADENOIDECTOMY  child  . TOTAL KNEE ARTHROPLASTY  12-14-2009   RIGHT  . TOTAL KNEE ARTHROPLASTY  Left 11/05/2012   Procedure: LEFT TOTAL KNEE ARTHROPLASTY;  Surgeon: Gearlean Alf, MD;  Location: [facility];  Service: Orthopedics;  Laterality: Left;  . TOTAL KNEE REVISION Right 07/29/2016   Procedure: RIGHT KNEE POLY-LINER EXCHANGE;  Surgeon: Mcarthur Rossetti, MD;  Location: [facility];  Service: Orthopedics;  Laterality: Right;  . TRIGGER FINGER RELEASE Right 02/21/2013   Procedure: RIGHT RING A-1 PULLEY RELEASE    (MINOR PROCEDURE) ;  Surgeon: Cammie Sickle., MD;  Location: [facility];  Service: Orthopedics;  Laterality: Right;  . TRIGGER FINGER RELEASE Left 09/22/2016   Procedure: RELEASE TRIGGER FINGER LEFT INDEX FINGER;  Surgeon: Mcarthur Rossetti, MD;  Location: [place];  Service: Orthopedics;  Laterality: Left;  . TRIGGER FINGER RELEASE Right 01/20/2017   Procedure:  RELEASE TRIGGER FINGER/A-1 PULLEY RIGHT INDEX FINGER;  Surgeon: Melrose Nakayama, MD;  Location: [place];  Service: Orthopedics;  Laterality: Right;  . VITRECTOMY 25 GAUGE WITH SCLERAL BUCKLE Right 03/20/2018   Procedure: RIGHT EYE VITRECTOMY WITH  ENDOLASER PARENTAL PHOTOCOAGULATION 25 GAUGE;  Surgeon: Jalene Mullet, MD;  Location: [place];  Service: Ophthalmology;  Laterality: Right;  . WRIST ARTHROSCOPY WITH DEBRIDEMENT Right 05/01/2019   Procedure: WRIST ARTHROSCOPY WITH DEBRIDEMENT;  Surgeon: Charlotte Crumb, MD;  Location: [place];  Service: Orthopedics;  Laterality: Right;    There were no vitals filed for this visit.  Subjective Assessment - 06/18/19 1407    Subjective   I have been doing my exercises - today I pushed a wheelchair    Currently in Pain?  Yes    Pain Score  3     Pain Location  Wrist    Pain Orientation  Right    Pain Descriptors / Indicators  Aching    Pain Type  Chronic pain    Pain Onset  More than a month ago    Pain Frequency  Intermittent    Aggravating Factors   squeezing motion    Pain Relieving Factors  brace, rest, ice, OTC meds                   OT Treatments/Exercises (OP) - 06/18/19 0001      Wrist Exercises   Other wrist exercises  Gentle active/passive range of motion to wrist flex/ext      RUE Fluidotherapy   Number Minutes Fluidotherapy  10 Minutes    RUE Fluidotherapy Location  Hand;Wrist;Forearm    Comments  Pain decreased from 3-2  following fluido.  Completed gentle exercise in fluido      Splinting   Splinting  Issued additional stockinette.  Patient reports padding helping over bony prominence      Manual Therapy   Manual Therapy  Joint mobilization    Joint Mobilization  Gentle carpal mobilization - carpal to forearm, carpal to carpal, and carpal to metacarpal.  Pain down to 1.5 at end of session.                    OT Long Term Goals - 06/06/19 1855      OT LONG TERM  GOAL #1   Title  Independent with HEP for Rt wrist and hand    Time  4    Period  Weeks    Status  New      OT LONG TERM GOAL #2   Title  Pt to verbalize understanding with A/E and task modifications to increase ease and decrease pain with  functional tasks    Time  4    Period  Weeks    Status  New      OT LONG TERM GOAL #3   Title  Grip strength Rt hand to increase to 46 lbs    Baseline  36 lbs    Time  4    Period  Weeks    Status  New      OT LONG TERM GOAL #4   Title  Lateral pinch to increase to 16 lbs Rt hand    Baseline  14 lbs    Time  4    Period  Weeks    Status  New      OT LONG TERM GOAL #5   Title  Pain overall to be 5/10 or under with all functional tasks using compensatory strategies/task modifications prn    Time  4    Period  Weeks    Status  New            Plan - 06/18/19 1608    Clinical Impression Statement  Patient is continuing to report pain with squeezing motion in right hand.  Patient unable toice at work.  Patient showing progress in range of motion in right wrist.    OT Frequency  2x / week    OT Duration  4 weeks    OT Treatment/Interventions  Self-care/ADL training;Therapeutic exercise;Ultrasound;Manual Therapy;Splinting;Iontophoresis;Therapeutic activities;Cryotherapy;Paraffin;DME and/or AE instruction;Compression bandaging;Fluidtherapy;Moist Heat;Passive range of motion;Patient/family education    Plan  fluidotherapy, AROM/PROM, gentle wrist mobs, modalities as needed    Consulted and Agree with Plan of Care  Patient       Patient will benefit from skilled therapeutic intervention in order to improve the following deficits and impairments:           Visit Diagnosis: Pain in right wrist  Muscle weakness (generalized)  Stiffness of right wrist, not elsewhere classified    Problem List Patient Active Problem List   Diagnosis Date Noted  . Radial head fracture, closed 06/16/2018  . Closed fracture dislocation of elbow  06/16/2018  . Other fatigue 07/18/2017  . Shortness of breath on exertion 07/18/2017  . Type 2 diabetes mellitus with retinopathy, with long-term current use of insulin (HCC) 07/18/2017  . Vitamin D deficiency 07/18/2017  . B12 nutritional deficiency 07/18/2017  . Other specified hypothyroidism 07/18/2017  . Acute pain of right knee 12/22/2016  . Trigger index finger of left hand 09/22/2016  . 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 Diabetes (HCC) 01/08/2014  . Essential hypertension 01/08/2014  . Hyperlipidemia 01/08/2014  . Postoperative anemia due to acute blood loss 11/22/2012  . Hyponatremia 11/06/2012  . OA (osteoarthritis) of knee 11/05/2012  . Villonodular synovitis of knee 04/20/2011    Collier Salina, OTR/L 06/18/2019, 4:11 PM  [facility] [address] [place], [place], [address] Phone: [phone]   Fax:  [phone]  Name: SHANTELLE ALLES MRN: [id number] Date of Birth: 05/16/1950

## 2019-06-18 NOTE — Telephone Encounter (Signed)
Last OV was 05/14/2018- pt does have appointment scheduled for May Scr 1.23 on 04/26/2019 crcl 78 ml/min Will send in 90 ds of Xarelto 20

## 2019-06-19 MED FILL — AMLODIPINE BESYLATE 5 MG TA: 5 | 90 days supply | Qty: 90 | Fill #2

## 2019-06-20 DIAGNOSIS — E113511 Type 2 diabetes mellitus with proliferative diabetic retinopathy with macular edema, right eye: Secondary | ICD-10-CM | POA: Diagnosis not present

## 2019-06-24 DIAGNOSIS — H2513 Age-related nuclear cataract, bilateral: Secondary | ICD-10-CM | POA: Diagnosis not present

## 2019-06-24 MED FILL — DULOXETINE HCL 60 MG CPEP: 60 | 30 days supply | Qty: 30 | Fill #10

## 2019-06-24 MED FILL — TRULICITY 3 MG/0.5ML SOPN: 3 | 28 days supply | Qty: 2 | Fill #5

## 2019-06-26 ENCOUNTER — Other Ambulatory Visit: Payer: Self-pay

## 2019-06-26 ENCOUNTER — Ambulatory Visit: Payer: 59 | Admitting: Occupational Therapy

## 2019-06-26 DIAGNOSIS — M6281 Muscle weakness (generalized): Secondary | ICD-10-CM

## 2019-06-26 DIAGNOSIS — M25631 Stiffness of right wrist, not elsewhere classified: Secondary | ICD-10-CM | POA: Diagnosis not present

## 2019-06-26 DIAGNOSIS — M25521 Pain in right elbow: Secondary | ICD-10-CM

## 2019-06-26 DIAGNOSIS — M25531 Pain in right wrist: Secondary | ICD-10-CM

## 2019-06-26 MED FILL — GATIFLOXACIN 0.5% EYE DROPS: 0.5 | 12 days supply | Qty: 3 | Fill #0

## 2019-06-26 MED FILL — DUREZOL 0.05% EYE DROPS: 0.05 | 25 days supply | Qty: 5 | Fill #0

## 2019-06-26 NOTE — Patient Instructions (Signed)
Dr Mina Marble, Josph Macho has been receiving occupational therapy for her RUE. She remains limited by pain with overuse. She reports right radial wrist pain and demonstrates a positive Finklestein's test. Please advise whether she has an additional diagnosis or if you think this is related to the TFCC debridement, so that we may adjust our treatment accordingly.  Sincerely,  Keene Breath, OTR/L

## 2019-06-27 NOTE — Therapy (Signed)
[facility] [address] [place], [place], [address] Phone: [phone]   Fax:  [phone]  Occupational Therapy Treatment  Patient Details  Name: [name] MRN: [id number] Date of Birth: [birth year] Referring Provider (OT): Dr. Mina Marble   Encounter Date: 06/26/2019  OT End of Session - 06/26/19 1755    Visit Number  4    Number of Visits  9    Date for OT Re-Evaluation  07/07/19    Authorization Type  [facility] - Clinical eligibility needed after 25 visits    OT Start Time  1750    OT Stop Time  1830    OT Time Calculation (min)  40 min    Activity Tolerance  Patient tolerated treatment well    Behavior During Therapy  [facility] for tasks assessed/performed       Past Medical History:  Diagnosis Date  . A-fib (HCC)   . Anemia    hx of  . Arthritis   . Chest pain   . Constipation   . Depression   . Diabetes mellitus ORAL MEDS  . Diabetic retinopathy (HCC)   . Dyspnea    with minimal exertion-deconditioned  . Dyspnea   . Dysrhythmia    a-fib  . Food allergy   . GERD (gastroesophageal reflux disease)    occasional  . Gout   . History of kidney stones   . Hypercholesteremia   . Hyperlipidemia   . Hypertension   . Hypertensive kidney disease   . Hypothyroidism   . Knee pain, right   . 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 Obesity   . OSA on CPAP    uses CPAP nightly  . Osteoarthritis   . Palpitations   . Pinched nerve in neck   . Pleurisy   . Pneumonia    hx of  . PONV (postoperative nausea and vomiting)    for 3-4 days after general anesthesia approx 10-12 years ago  . Sciatica   . Swelling of knee joint, right   . Synovitis of knee RIGHT  . Trigger finger, left    left index  . Vitamin D deficiency     Past Surgical History:  Procedure Laterality Date  . CESAREAN SECTION  X3  . KNEE ARTHROSCOPY  04/20/2011   Procedure: ARTHROSCOPY KNEE;  Surgeon: Loanne Drilling, MD;   Location: [facility];  Service: Orthopedics;  Laterality: Right;  WITH SYNOVECTOMY  . LEFT CARPAL TUNNEL / LEFT MIDDLE & RING FINGER TRIGGER RELEASE  08-26-2008  . LEFT SHOULDER ARTHROSCOPY W/ DEBRIDEMENT  09-09-2003  . LEFT SHOULDER ARTHROSCOPY/ LEFT THUMB TRIGGER RELEASE  02-22-2005  . PHOTOCOAGULATION WITH LASER Right 03/20/2018   Procedure: PHOTOCOAGULATION WITH LASER;  Surgeon: Carmela Rima, MD;  Location: [facility] OR;  Service: Ophthalmology;  Laterality: Right;  . PULLEY RELEASE LEFT LONG FINGER  07-14-2009  . RADIAL HEAD ARTHROPLASTY Right 06/17/2018   Procedure: RADIAL HEAD ARTHROPLASTY;  Surgeon: Bjorn Pippin, MD;  Location: [facility] OR;  Service: Orthopedics;  Laterality: Right;  . RIGHT CARPAL TUNNEL/ RIGHT THUMB TRIGGER RELEASE'S  11-28-2006  . RIGHT SHOULDER ARTHROSCOPY W/ ROTATOR CUFF REPAIR  01-13-2004  . SHOULDER ARTHROSCOPY DISTAL CLAVICLE EXCISION AND OPEN ROTATOR CUFF REPAIR  09-07-2004   LEFT  . SHOULDER ARTHROSCOPY W/ ACROMIAL REPAIR  11-29-2005   LEFT  . TONSILLECTOMY AND ADENOIDECTOMY  child  . TOTAL KNEE ARTHROPLASTY  12-14-2009   RIGHT  . TOTAL KNEE ARTHROPLASTY  Left 11/05/2012   Procedure: LEFT TOTAL KNEE ARTHROPLASTY;  Surgeon: Loanne Drilling, MD;  [facility];  Service: Orthopedics;  Laterality: Left;  . TOTAL KNEE REVISION Right 07/29/2016   Procedure: RIGHT KNEE POLY-LINER EXCHANGE;  Surgeon: Kathryne Hitch, MD;  Location: [facility];  Service: Orthopedics;  Laterality: Right;  . TRIGGER FINGER RELEASE Right 02/21/2013   Procedure: RIGHT RING A-1 PULLEY RELEASE    (MINOR PROCEDURE) ;  Surgeon: Wyn Forster., MD;  Location: [facility];  Service: Orthopedics;  Laterality: Right;  . TRIGGER FINGER RELEASE Left 09/22/2016   Procedure: RELEASE TRIGGER FINGER LEFT INDEX FINGER;  Surgeon: Kathryne Hitch, MD;  Location: [facility];  Service: Orthopedics;  Laterality: Left;  . TRIGGER FINGER RELEASE Right 01/20/2017   Procedure:  RELEASE TRIGGER FINGER/A-1 PULLEY RIGHT INDEX FINGER;  Surgeon: Marcene Corning, MD;  Location: [facility];  Service: Orthopedics;  Laterality: Right;  . VITRECTOMY 25 GAUGE WITH SCLERAL BUCKLE Right 03/20/2018   Procedure: RIGHT EYE VITRECTOMY WITH  ENDOLASER PARENTAL PHOTOCOAGULATION 25 GAUGE;  Surgeon: Carmela Rima, MD;  Location: [facility] OR;  Service: Ophthalmology;  Laterality: Right;  . WRIST ARTHROSCOPY WITH DEBRIDEMENT Right 05/01/2019   Procedure: WRIST ARTHROSCOPY WITH DEBRIDEMENT;  Surgeon: Dairl Ponder, MD;  Location: [facility];  Service: Orthopedics;  Laterality: Right;    There were no vitals filed for this visit.  Subjective Assessment - 06/26/19 1754    Subjective   Pt rpeorts overdoing it at work today    Currently in Pain?  Yes    Pain Score  5     Pain Location  Hand    Pain Orientation  Right    Pain Descriptors / Indicators  Aching    Pain Type  Acute pain    Pain Onset  More than a month ago    Pain Frequency  Intermittent    Aggravating Factors   overuse    Pain Relieving Factors  rest, ice                Treatment: Fluidotherapy x 9 mins for pain relief no adverse reactions. Reviewed isometric exercises form HEP then progressed to wrist flexion/ extension with 1 lbs weight. Gentle wrist mobs performed. Pt reports radial wrist pain, and demo positive Finkelstein's test. Note sent with pt to take to MD. Ice pack x 8 mins end of session. No adverse reactions.                 OT Long Term Goals - 06/06/19 1855      OT LONG TERM GOAL #1   Title  Independent with HEP for Rt wrist and hand    Time  4    Period  Weeks    Status  New      OT LONG TERM GOAL #2   Title  Pt to verbalize understanding with A/E and task modifications to increase ease and decrease pain with functional tasks    Time  4    Period  Weeks    Status  New      OT LONG TERM GOAL #3   Title  Grip strength Rt hand to increase to 46 lbs     Baseline  36 lbs    Time  4    Period  Weeks    Status  New      OT LONG TERM GOAL #4   Title  Lateral pinch to increase to 16  lbs Rt hand    Baseline  14 lbs    Time  4    Period  Weeks    Status  New      OT LONG TERM GOAL #5   Title  Pain overall to be 5/10 or under with all functional tasks using compensatory strategies/task modifications prn    Time  4    Period  Weeks    Status  New            Plan - 06/27/19 1752    Clinical Impression Statement  Pt demonstrates positive Finkelstein's test. Note sent with pt to take to MD office.    OT Frequency  2x / week    OT Duration  4 weeks    OT Treatment/Interventions  Self-care/ADL training;Therapeutic exercise;Ultrasound;Manual Therapy;Splinting;Iontophoresis;Therapeutic activities;Cryotherapy;Paraffin;DME and/or AE instruction;Compression bandaging;Fluidtherapy;Moist Heat;Passive range of motion;Patient/family education    Plan  fluidotherapy, AROM/PROM, gentle wrist mobs, modalities as needed    Consulted and Agree with Plan of Care  Patient       Patient will benefit from skilled therapeutic intervention in order to improve the following deficits and impairments:           Visit Diagnosis: Pain in right wrist  Muscle weakness (generalized)  Stiffness of right wrist, not elsewhere classified  Pain in right elbow    Problem List Patient Active Problem List   Diagnosis Date Noted  . Radial head fracture, closed 06/16/2018  . Closed fracture dislocation of elbow 06/16/2018  . Other fatigue 07/18/2017  . Shortness of breath on exertion 07/18/2017  . Type 2 diabetes mellitus with retinopathy, with long-term current use of insulin ([place]) 07/18/2017  . Vitamin D deficiency 07/18/2017  . B12 nutritional deficiency 07/18/2017  . Other specified hypothyroidism 07/18/2017  . Acute pain of right knee 12/22/2016  . Trigger index finger of left hand 09/22/2016  . 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 Diabetes ([place]) 01/08/2014  . Essential hypertension 01/08/2014  . Hyperlipidemia 01/08/2014  . Postoperative anemia due to acute blood loss 11/22/2012  . Hyponatremia 11/06/2012  . OA (osteoarthritis) of knee 11/05/2012  . Villonodular synovitis of knee 04/20/2011    Leroy Trim 06/27/2019, 5:53 PM  [facility] [address] [place], [place], [address] Phone: [phone]   Fax:  [phone]  Name: [name] MRN: [id number] Date of Birth: Jun 29, 1950

## 2019-07-02 DIAGNOSIS — M654 Radial styloid tenosynovitis [de Quervain]: Secondary | ICD-10-CM | POA: Diagnosis not present

## 2019-07-04 ENCOUNTER — Ambulatory Visit: Payer: 59 | Admitting: Occupational Therapy

## 2019-07-04 ENCOUNTER — Other Ambulatory Visit: Payer: Self-pay

## 2019-07-04 DIAGNOSIS — M25631 Stiffness of right wrist, not elsewhere classified: Secondary | ICD-10-CM | POA: Diagnosis not present

## 2019-07-04 DIAGNOSIS — M25521 Pain in right elbow: Secondary | ICD-10-CM | POA: Diagnosis not present

## 2019-07-04 DIAGNOSIS — M25531 Pain in right wrist: Secondary | ICD-10-CM

## 2019-07-04 DIAGNOSIS — M6281 Muscle weakness (generalized): Secondary | ICD-10-CM | POA: Diagnosis not present

## 2019-07-04 NOTE — Patient Instructions (Signed)
Wrist Flexion / Extension    Hold elbows at 90 and close to body, with palms down. Bend both wrists so fingers point up. Then bend wrist so fingers point down. Repeat sequence _10___ times per session. Do __3__ sessions per day. KEEP THUMB RELAXED   AROM: Thumb Flexion / Extension    KEEP WRIST NEUTRAL. Actively bend right thumb across palm as far as possible. Hold __3__ seconds. Relax. Then pull thumb back into hitchhike position. Repeat __10__ times per set.  Do _3___ sessions per day.

## 2019-07-04 NOTE — Therapy (Signed)
[facility] [address] [place], [place], [address] Phone: [phone]   Fax:  [phone]  Occupational Therapy Treatment  Patient Details  Name: [name] MRN: [id number] Date of Birth: [birth year] Referring Provider (OT): Dr. Mina Marble   Encounter Date: 07/04/2019  OT End of Session - 07/04/19 1724    Visit Number  5    Number of Visits  9    Date for OT Re-Evaluation  07/07/19    Authorization Type  [facility] - Clinical eligibility needed after 25 visits    OT Start Time  1400    OT Stop Time  1450    OT Time Calculation (min)  50 min    Activity Tolerance  Patient tolerated treatment well    Behavior During Therapy  [facility] for tasks assessed/performed       Past Medical History:  Diagnosis Date  . A-fib (HCC)   . Anemia    hx of  . Arthritis   . Chest pain   . Constipation   . Depression   . Diabetes mellitus ORAL MEDS  . Diabetic retinopathy (HCC)   . Dyspnea    with minimal exertion-deconditioned  . Dyspnea   . Dysrhythmia    a-fib  . Food allergy   . GERD (gastroesophageal reflux disease)    occasional  . Gout   . History of kidney stones   . Hypercholesteremia   . Hyperlipidemia   . Hypertension   . Hypertensive kidney disease   . Hypothyroidism   . Knee pain, right   . 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 Obesity   . OSA on CPAP    uses CPAP nightly  . Osteoarthritis   . Palpitations   . Pinched nerve in neck   . Pleurisy   . Pneumonia    hx of  . PONV (postoperative nausea and vomiting)    for 3-4 days after general anesthesia approx 10-12 years ago  . Sciatica   . Swelling of knee joint, right   . Synovitis of knee RIGHT  . Trigger finger, left    left index  . Vitamin D deficiency     Past Surgical History:  Procedure Laterality Date  . CESAREAN SECTION  X3  . KNEE ARTHROSCOPY  04/20/2011   Procedure: ARTHROSCOPY KNEE;  Surgeon: Loanne Drilling, MD;   Location: [facility];  Service: Orthopedics;  Laterality: Right;  WITH SYNOVECTOMY  . LEFT CARPAL TUNNEL / LEFT MIDDLE & RING FINGER TRIGGER RELEASE  08-26-2008  . LEFT SHOULDER ARTHROSCOPY W/ DEBRIDEMENT  09-09-2003  . LEFT SHOULDER ARTHROSCOPY/ LEFT THUMB TRIGGER RELEASE  02-22-2005  . PHOTOCOAGULATION WITH LASER Right 03/20/2018   Procedure: PHOTOCOAGULATION WITH LASER;  Surgeon: Carmela Rima, MD;  Location: [facility] OR;  Service: Ophthalmology;  Laterality: Right;  . PULLEY RELEASE LEFT LONG FINGER  07-14-2009  . RADIAL HEAD ARTHROPLASTY Right 06/17/2018   Procedure: RADIAL HEAD ARTHROPLASTY;  Surgeon: Bjorn Pippin, MD;  [facility];  Service: Orthopedics;  Laterality: Right;  . RIGHT CARPAL TUNNEL/ RIGHT THUMB TRIGGER RELEASE'S  11-28-2006  . RIGHT SHOULDER ARTHROSCOPY W/ ROTATOR CUFF REPAIR  01-13-2004  . SHOULDER ARTHROSCOPY DISTAL CLAVICLE EXCISION AND OPEN ROTATOR CUFF REPAIR  09-07-2004   LEFT  . SHOULDER ARTHROSCOPY W/ ACROMIAL REPAIR  11-29-2005   LEFT  . TONSILLECTOMY AND ADENOIDECTOMY  child  . TOTAL KNEE ARTHROPLASTY  12-14-2009   RIGHT  . TOTAL KNEE ARTHROPLASTY  Left 11/05/2012   Procedure: LEFT TOTAL KNEE ARTHROPLASTY;  Surgeon: Gearlean Alf, MD;  [facility];  Service: Orthopedics;  Laterality: Left;  . TOTAL KNEE REVISION Right 07/29/2016   Procedure: RIGHT KNEE POLY-LINER EXCHANGE;  Surgeon: Mcarthur Rossetti, MD;  Location: [facility];  Service: Orthopedics;  Laterality: Right;  . TRIGGER FINGER RELEASE Right 02/21/2013   Procedure: RIGHT RING A-1 PULLEY RELEASE    (MINOR PROCEDURE) ;  Surgeon: Cammie Sickle., MD;  Location: [facility];  Service: Orthopedics;  Laterality: Right;  . TRIGGER FINGER RELEASE Left 09/22/2016   Procedure: RELEASE TRIGGER FINGER LEFT INDEX FINGER;  Surgeon: Mcarthur Rossetti, MD;  Location: [place];  Service: Orthopedics;  Laterality: Left;  . TRIGGER FINGER RELEASE Right 01/20/2017   Procedure:  RELEASE TRIGGER FINGER/A-1 PULLEY RIGHT INDEX FINGER;  Surgeon: Melrose Nakayama, MD;  Location: [place];  Service: Orthopedics;  Laterality: Right;  . VITRECTOMY 25 GAUGE WITH SCLERAL BUCKLE Right 03/20/2018   Procedure: RIGHT EYE VITRECTOMY WITH  ENDOLASER PARENTAL PHOTOCOAGULATION 25 GAUGE;  Surgeon: Jalene Mullet, MD;  Location: [place];  Service: Ophthalmology;  Laterality: Right;  . WRIST ARTHROSCOPY WITH DEBRIDEMENT Right 05/01/2019   Procedure: WRIST ARTHROSCOPY WITH DEBRIDEMENT;  Surgeon: Charlotte Crumb, MD;  Location: [place];  Service: Orthopedics;  Laterality: Right;    There were no vitals filed for this visit.  Subjective Assessment - 07/04/19 1402    Subjective   Pt returns today after seeing MD on Tuesday 07/02/19 - MD gave cortisone injection to radial wrist and pt reports he diagnosed her w/ DeQuervains tenosynovitis (Also in office notes in EPIC)    Pertinent History  s/p TFCC debridement 05/01/19, SL ligament fraying secondary to [place] injury to Rt elbow in April 2020 from fall. DM Type II, HTN. 07/02/19 - DeQuervains tenosynovitis    Limitations  light duty, NO lifting > 10 lbs, brace on all the time at work    Currently in Pain?  Yes    Pain Score  3     Pain Location  Wrist    Pain Orientation  Right    Pain Descriptors / Indicators  Aching    Pain Type  Acute pain    Pain Onset  More than a month ago    Pain Frequency  Intermittent    Aggravating Factors   overuse    Pain Relieving Factors  rest, ice        Pt reports she does have DeQuervains tenosynovitis and therefore issued HEP for this today - see pt instructions. However only did thumb ex and demo wrist flex/ext with other hand as MD wants to keep wrist immobolized for 72 hrs after injection.  Fluidotherapy x 10 min. Rt hand.  Discussion on task modifications and adaptations for tasks that are painful and potential A/E needs. Discussed different ways to hold pots/pans, wring  out washcloth, open jars and cans, hold groceries, etc. And recommended jar opener, electric can opener, ergonomic peeler and knife or Rt angled knife, and possibly chopper.  Ice pack x 5 min at end of session.  Pt asked if she could continue isometric wrist ex's and therapist said keep doing these but stop if pain increases especially for RD or UD                  OT Education - 07/04/19 1432    Education Details  wrist flex/ext w/ thumb relaxed, thumb flex/ext with  wrist neutral, task modifications and potential A/E    Person(s) Educated  Patient    Methods  Explanation;Demonstration;Handout    Comprehension  Verbalized understanding;Returned demonstration          OT Long Term Goals - 07/04/19 1725      OT LONG TERM GOAL #1   Title  Independent with HEP for Rt wrist and hand    Time  4    Period  Weeks    Status  On-going      OT LONG TERM GOAL #2   Title  Pt to verbalize understanding with A/E and task modifications to increase ease and decrease pain with functional tasks    Time  4    Period  Weeks    Status  Achieved      OT LONG TERM GOAL #3   Title  Grip strength Rt hand to increase to 46 lbs    Baseline  36 lbs    Time  4    Period  Weeks    Status  New      OT LONG TERM GOAL #4   Title  Lateral pinch to increase to 16 lbs Rt hand    Baseline  14 lbs    Time  4    Period  Weeks    Status  New      OT LONG TERM GOAL #5   Title  Pain overall to be 5/10 or under with all functional tasks using compensatory strategies/task modifications prn    Time  4    Period  Weeks    Status  New            Plan - 07/04/19 1728    Clinical Impression Statement  Pt with new diagnosis of DeQuervains tenosynovitis. Pt had injection at MD office on 07/02/19    Occupational performance deficits (Please refer to evaluation for details):  ADL's;IADL's;Work;Leisure    Body Structure / Function / Physical Skills  ADL;ROM;IADL;Edema;Body  mechanics;Flexibility;Strength;Coordination;Pain;UE functional use    Rehab Potential  Good    OT Frequency  2x / week    OT Duration  4 weeks    OT Treatment/Interventions  Self-care/ADL training;Therapeutic exercise;Ultrasound;Manual Therapy;Splinting;Iontophoresis;Therapeutic activities;Cryotherapy;Paraffin;DME and/or AE instruction;Compression bandaging;Fluidtherapy;Moist Heat;Passive range of motion;Patient/family education    Plan  continue modalities, A/ROM and P/ROM, light grip strengthening as tolerated    Consulted and Agree with Plan of Care  Patient       Patient will benefit from skilled therapeutic intervention in order to improve the following deficits and impairments:   Body Structure / Function / Physical Skills: ADL, ROM, IADL, Edema, Body mechanics, Flexibility, Strength, Coordination, Pain, UE functional use       Visit Diagnosis: Pain in right wrist  Stiffness of right wrist, not elsewhere classified  Muscle weakness (generalized)    Problem List Patient Active Problem List   Diagnosis Date Noted  . Radial head fracture, closed 06/16/2018  . Closed fracture dislocation of elbow 06/16/2018  . Other fatigue 07/18/2017  . Shortness of breath on exertion 07/18/2017  . Type 2 diabetes mellitus with retinopathy, with long-term current use of insulin (HCC) 07/18/2017  . Vitamin D deficiency 07/18/2017  . B12 nutritional deficiency 07/18/2017  . Other specified hypothyroidism 07/18/2017  . Acute pain of right knee 12/22/2016  . Trigger index finger of left hand 09/22/2016  . 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 Diabetes (HCC) 01/08/2014  . Essential hypertension 01/08/2014  . Hyperlipidemia 01/08/2014  . Postoperative anemia due to acute blood loss 11/22/2012  . Hyponatremia 11/06/2012  . OA  (osteoarthritis) of knee 11/05/2012  . Villonodular synovitis of knee 04/20/2011    Kelli Churn, OTR/L 07/04/2019, 5:30 PM  [place] [facility] [address] [place], [place], [address] Phone: [phone]   Fax:  [phone]  Name: [name] MRN: [id number] Date of Birth: 1950/05/13

## 2019-07-10 ENCOUNTER — Ambulatory Visit: Payer: 59 | Admitting: Occupational Therapy

## 2019-07-10 ENCOUNTER — Encounter: Payer: Self-pay | Admitting: Occupational Therapy

## 2019-07-10 ENCOUNTER — Other Ambulatory Visit: Payer: Self-pay

## 2019-07-10 DIAGNOSIS — M25531 Pain in right wrist: Secondary | ICD-10-CM

## 2019-07-10 DIAGNOSIS — M6281 Muscle weakness (generalized): Secondary | ICD-10-CM

## 2019-07-10 DIAGNOSIS — M25631 Stiffness of right wrist, not elsewhere classified: Secondary | ICD-10-CM

## 2019-07-10 DIAGNOSIS — M25521 Pain in right elbow: Secondary | ICD-10-CM | POA: Diagnosis not present

## 2019-07-10 NOTE — Patient Instructions (Signed)
1. Grip Strengthening (Resistive Putty)- perform every other day, stop if pain increases significantly   Squeeze putty using thumb and all fingers. Repeat _10-20__ times. Do __1__ sessions every other  per day.   2. Roll putty into tube on table and pinch between each finger and thumb x 10 reps each. (can do ring and small finger together)     Copyright  VHI. All rights reserved.

## 2019-07-11 ENCOUNTER — Other Ambulatory Visit (INDEPENDENT_AMBULATORY_CARE_PROVIDER_SITE_OTHER): Payer: Self-pay | Admitting: Orthopaedic Surgery

## 2019-07-11 MED FILL — METHOCARBAMOL 500 MG TABS: 500 | 15 days supply | Qty: 60 | Fill #0

## 2019-07-11 MED FILL — ATORVASTATIN 40 MG TABLET: 40 | 90 days supply | Qty: 90 | Fill #3

## 2019-07-11 NOTE — Therapy (Signed)
[facility] [address] [place], [place], [address] Phone: [phone]   Fax:  [phone]  Occupational Therapy Treatment  Patient Details  Name: [name] MRN: [id number] Date of Birth: 29-Nov-1950 Referring Provider (OT): Dr. Mina Marble   Encounter Date: 07/10/2019  OT End of Session - 07/10/19 1650    Visit Number  6    Number of Visits  9    Date for OT Re-Evaluation  07/07/19    Authorization Type  [facility] - Clinical eligibility needed after 25 visits    OT Start Time  1620    OT Stop Time  1700    OT Time Calculation (min)  40 min    Activity Tolerance  Patient tolerated treatment well    Behavior During Therapy  [facility] for tasks assessed/performed       Past Medical History:  Diagnosis Date  . A-fib (HCC)   . Anemia    hx of  . Arthritis   . Chest pain   . Constipation   . Depression   . Diabetes mellitus ORAL MEDS  . Diabetic retinopathy (HCC)   . Dyspnea    with minimal exertion-deconditioned  . Dyspnea   . Dysrhythmia    a-fib  . Food allergy   . GERD (gastroesophageal reflux disease)    occasional  . Gout   . History of kidney stones   . Hypercholesteremia   . Hyperlipidemia   . Hypertension   . Hypertensive kidney disease   . Hypothyroidism   . Knee pain, right   . 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 Obesity   . OSA on CPAP    uses CPAP nightly  . Osteoarthritis   . Palpitations   . Pinched nerve in neck   . Pleurisy   . Pneumonia    hx of  . PONV (postoperative nausea and vomiting)    for 3-4 days after general anesthesia approx 10-12 years ago  . Sciatica   . Swelling of knee joint, right   . Synovitis of knee RIGHT  . Trigger finger, left    left index  . Vitamin D deficiency     Past Surgical History:  Procedure Laterality Date  . CESAREAN SECTION  X3  . KNEE ARTHROSCOPY  04/20/2011   Procedure: ARTHROSCOPY KNEE;  Surgeon: Loanne Drilling, MD;   Location: [facility];  Service: Orthopedics;  Laterality: Right;  WITH SYNOVECTOMY  . LEFT CARPAL TUNNEL / LEFT MIDDLE & RING FINGER TRIGGER RELEASE  08-26-2008  . LEFT SHOULDER ARTHROSCOPY W/ DEBRIDEMENT  09-09-2003  . LEFT SHOULDER ARTHROSCOPY/ LEFT THUMB TRIGGER RELEASE  02-22-2005  . PHOTOCOAGULATION WITH LASER Right 03/20/2018   Procedure: PHOTOCOAGULATION WITH LASER;  Surgeon: Carmela Rima, MD;  Location: [facility] OR;  Service: Ophthalmology;  Laterality: Right;  . PULLEY RELEASE LEFT LONG FINGER  07-14-2009  . RADIAL HEAD ARTHROPLASTY Right 06/17/2018   Procedure: RADIAL HEAD ARTHROPLASTY;  Surgeon: Bjorn Pippin, MD;  [facility];  Service: Orthopedics;  Laterality: Right;  . RIGHT CARPAL TUNNEL/ RIGHT THUMB TRIGGER RELEASE'S  11-28-2006  . RIGHT SHOULDER ARTHROSCOPY W/ ROTATOR CUFF REPAIR  01-13-2004  . SHOULDER ARTHROSCOPY DISTAL CLAVICLE EXCISION AND OPEN ROTATOR CUFF REPAIR  09-07-2004   LEFT  . SHOULDER ARTHROSCOPY W/ ACROMIAL REPAIR  11-29-2005   LEFT  . TONSILLECTOMY AND ADENOIDECTOMY  child  . TOTAL KNEE ARTHROPLASTY  12-14-2009   RIGHT  . TOTAL KNEE ARTHROPLASTY  Left 11/05/2012   Procedure: LEFT TOTAL KNEE ARTHROPLASTY;  Surgeon: Loanne Drilling, MD;  [facility];  Service: Orthopedics;  Laterality: Left;  . TOTAL KNEE REVISION Right 07/29/2016   Procedure: RIGHT KNEE POLY-LINER EXCHANGE;  Surgeon: Kathryne Hitch, MD;  [facility];  Service: Orthopedics;  Laterality: Right;  . TRIGGER FINGER RELEASE Right 02/21/2013   Procedure: RIGHT RING A-1 PULLEY RELEASE    (MINOR PROCEDURE) ;  Surgeon: Wyn Forster., MD;  Location: [facility];  Service: Orthopedics;  Laterality: Right;  . TRIGGER FINGER RELEASE Left 09/22/2016   Procedure: RELEASE TRIGGER FINGER LEFT INDEX FINGER;  Surgeon: Kathryne Hitch, MD;  [facility];  Service: Orthopedics;  Laterality: Left;  . TRIGGER FINGER RELEASE Right 01/20/2017   Procedure:  RELEASE TRIGGER FINGER/A-1 PULLEY RIGHT INDEX FINGER;  Surgeon: Marcene Corning, MD;  Location: [facility];  Service: Orthopedics;  Laterality: Right;  . VITRECTOMY 25 GAUGE WITH SCLERAL BUCKLE Right 03/20/2018   Procedure: RIGHT EYE VITRECTOMY WITH  ENDOLASER PARENTAL PHOTOCOAGULATION 25 GAUGE;  Surgeon: Carmela Rima, MD;  Location: [facility] OR;  Service: Ophthalmology;  Laterality: Right;  . WRIST ARTHROSCOPY WITH DEBRIDEMENT Right 05/01/2019   Procedure: WRIST ARTHROSCOPY WITH DEBRIDEMENT;  Surgeon: Dairl Ponder, MD;  Location: [facility];  Service: Orthopedics;  Laterality: Right;    There were no vitals filed for this visit.  Subjective Assessment - 07/10/19 1649    Currently in Pain?  Yes    Pain Score  4     Pain Location  Wrist    Pain Orientation  Right    Pain Descriptors / Indicators  Aching    Pain Type  Acute pain    Pain Onset  More than a month ago    Pain Frequency  Intermittent    Aggravating Factors   overuse    Pain Relieving Factors  ice, heat             Treatment: [name]  , 0.8 w/cm 2,   20%to right radial and dorsal wrist, x 10 mins for pain, no adverse reactions. Bruise present at injection site. Ice x 8 mins end of session.              OT Education - 07/11/19 1354    Education Details  wrist flex/ext w/ thumb relaxed, thumb flex/ext with wrist neutral-reveiwed as well as isometric wrist flexion/ extension, pt was instructed to stop radial and ular isometrics, putty HEP issued- see pt instructions.    Person(s) Educated  Patient    Methods  Explanation;Demonstration;Handout    Comprehension  Verbalized understanding;Returned demonstration          OT Long Term Goals - 07/04/19 1725      OT LONG TERM GOAL #1   Title  Independent with HEP for Rt wrist and hand    Time  4    Period  Weeks    Status  On-going      OT LONG TERM GOAL #2   Title  Pt to verbalize understanding with A/E and task  modifications to increase ease and decrease pain with functional tasks    Time  4    Period  Weeks    Status  Achieved      OT LONG TERM GOAL #3   Title  Grip strength Rt hand to increase to 46 lbs    Baseline  36 lbs    Time  4  Period  Weeks    Status  New      OT LONG TERM GOAL #4   Title  Lateral pinch to increase to 16 lbs Rt hand    Baseline  14 lbs    Time  4    Period  Weeks    Status  New      OT LONG TERM GOAL #5   Title  Pain overall to be 5/10 or under with all functional tasks using compensatory strategies/task modifications prn    Time  4    Period  Weeks    Status  New            Plan - 07/10/19 1652    Clinical Impression Statement  Pt is progressing slowly limited by pain.    Occupational performance deficits (Please refer to evaluation for details):  ADL's;IADL's;Work;Leisure    Body Structure / Function / Physical Skills  ADL;ROM;IADL;Edema;Body mechanics;Flexibility;Strength;Coordination;Pain;UE functional use    Rehab Potential  Good    OT Frequency  2x / week    OT Duration  4 weeks    OT Treatment/Interventions  Self-care/ADL training;Therapeutic exercise;Ultrasound;Manual Therapy;Splinting;Iontophoresis;Therapeutic activities;Cryotherapy;Paraffin;DME and/or AE instruction;Compression bandaging;Fluidtherapy;Moist Heat;Passive range of motion;Patient/family education    Plan  continue modalities, A/ROM and P/ROM, light grip strengthening as tolerated    Consulted and Agree with Plan of Care  Patient       Patient will benefit from skilled therapeutic intervention in order to improve the following deficits and impairments:   Body Structure / Function / Physical Skills: ADL, ROM, IADL, Edema, Body mechanics, Flexibility, Strength, Coordination, Pain, UE functional use       Visit Diagnosis: Pain in right wrist  Stiffness of right wrist, not elsewhere classified  Muscle weakness (generalized)    Problem List Patient Active Problem List    Diagnosis Date Noted  . Radial head fracture, closed 06/16/2018  . Closed fracture dislocation of elbow 06/16/2018  . Other fatigue 07/18/2017  . Shortness of breath on exertion 07/18/2017  . Type 2 diabetes mellitus with retinopathy, with long-term current use of insulin ([place]) 07/18/2017  . Vitamin D deficiency 07/18/2017  . B12 nutritional deficiency 07/18/2017  . Other specified hypothyroidism 07/18/2017  . Acute pain of right knee 12/22/2016  . Trigger index finger of left hand 09/22/2016  . 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 Diabetes ([place]) 01/08/2014  . Essential hypertension 01/08/2014  . Hyperlipidemia 01/08/2014  . Postoperative anemia due to acute blood loss 11/22/2012  . Hyponatremia 11/06/2012  . OA (osteoarthritis) of knee 11/05/2012  . Villonodular synovitis of knee 04/20/2011    Jailon Schaible 07/11/2019, 1:56 PM  [place] [address] [place], [place], [address] Phone: [phone]   Fax:  [phone]  Name: [name] MRN: [id number] Date of Birth: [birth year]

## 2019-07-11 NOTE — Telephone Encounter (Signed)
Please advise 

## 2019-07-17 ENCOUNTER — Other Ambulatory Visit: Payer: Self-pay

## 2019-07-17 ENCOUNTER — Ambulatory Visit: Payer: 59 | Attending: Orthopedic Surgery | Admitting: Occupational Therapy

## 2019-07-17 DIAGNOSIS — M6281 Muscle weakness (generalized): Secondary | ICD-10-CM | POA: Diagnosis not present

## 2019-07-17 DIAGNOSIS — M25531 Pain in right wrist: Secondary | ICD-10-CM | POA: Diagnosis not present

## 2019-07-17 DIAGNOSIS — M25631 Stiffness of right wrist, not elsewhere classified: Secondary | ICD-10-CM | POA: Diagnosis not present

## 2019-07-17 NOTE — Therapy (Signed)
[facility] [address] [place], [place], [address] Phone: [phone]   Fax:  [phone]  Occupational Therapy Treatment  Patient Details  Name: [name] MRN: [id number] Date of Birth: 1950-12-05 Referring Provider (OT): Dr. Mina Marble   Encounter Date: 07/17/2019  OT End of Session - 07/17/19 1432    Visit Number  7    Number of Visits  9    Date for OT Re-Evaluation  07/07/19    Authorization Type  [facility] - Clinical eligibility needed after 25 visits    OT Start Time  1402    OT Stop Time  1443    OT Time Calculation (min)  41 min    Activity Tolerance  Patient tolerated treatment well    Behavior During Therapy  [facility] for tasks assessed/performed       Past Medical History:  Diagnosis Date  . A-fib (HCC)   . Anemia    hx of  . Arthritis   . Chest pain   . Constipation   . Depression   . Diabetes mellitus ORAL MEDS  . Diabetic retinopathy (HCC)   . Dyspnea    with minimal exertion-deconditioned  . Dyspnea   . Dysrhythmia    a-fib  . Food allergy   . GERD (gastroesophageal reflux disease)    occasional  . Gout   . History of kidney stones   . Hypercholesteremia   . Hyperlipidemia   . Hypertension   . Hypertensive kidney disease   . Hypothyroidism   . Knee pain, right   . 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 Obesity   . OSA on CPAP    uses CPAP nightly  . Osteoarthritis   . Palpitations   . Pinched nerve in neck   . Pleurisy   . Pneumonia    hx of  . PONV (postoperative nausea and vomiting)    for 3-4 days after general anesthesia approx 10-12 years ago  . Sciatica   . Swelling of knee joint, right   . Synovitis of knee RIGHT  . Trigger finger, left    left index  . Vitamin D deficiency     Past Surgical History:  Procedure Laterality Date  . CESAREAN SECTION  X3  . KNEE ARTHROSCOPY  04/20/2011   Procedure: ARTHROSCOPY KNEE;  Surgeon: Loanne Drilling, MD;   Location: [facility];  Service: Orthopedics;  Laterality: Right;  WITH SYNOVECTOMY  . LEFT CARPAL TUNNEL / LEFT MIDDLE & RING FINGER TRIGGER RELEASE  08-26-2008  . LEFT SHOULDER ARTHROSCOPY W/ DEBRIDEMENT  09-09-2003  . LEFT SHOULDER ARTHROSCOPY/ LEFT THUMB TRIGGER RELEASE  02-22-2005  . PHOTOCOAGULATION WITH LASER Right 03/20/2018   Procedure: PHOTOCOAGULATION WITH LASER;  Surgeon: Carmela Rima, MD;  [facility] OR;  Service: Ophthalmology;  Laterality: Right;  . PULLEY RELEASE LEFT LONG FINGER  07-14-2009  . RADIAL HEAD ARTHROPLASTY Right 06/17/2018   Procedure: RADIAL HEAD ARTHROPLASTY;  Surgeon: Bjorn Pippin, MD;  [facility];  Service: Orthopedics;  Laterality: Right;  . RIGHT CARPAL TUNNEL/ RIGHT THUMB TRIGGER RELEASE'S  11-28-2006  . RIGHT SHOULDER ARTHROSCOPY W/ ROTATOR CUFF REPAIR  01-13-2004  . SHOULDER ARTHROSCOPY DISTAL CLAVICLE EXCISION AND OPEN ROTATOR CUFF REPAIR  09-07-2004   LEFT  . SHOULDER ARTHROSCOPY W/ ACROMIAL REPAIR  11-29-2005   LEFT  . TONSILLECTOMY AND ADENOIDECTOMY  child  . TOTAL KNEE ARTHROPLASTY  12-14-2009   RIGHT  . TOTAL KNEE ARTHROPLASTY  Left 11/05/2012   Procedure: LEFT TOTAL KNEE ARTHROPLASTY;  Surgeon: Gearlean Alf, MD;  Location: [facility];  Service: Orthopedics;  Laterality: Left;  . TOTAL KNEE REVISION Right 07/29/2016   Procedure: RIGHT KNEE POLY-LINER EXCHANGE;  Surgeon: Mcarthur Rossetti, MD;  Location: [facility];  Service: Orthopedics;  Laterality: Right;  . TRIGGER FINGER RELEASE Right 02/21/2013   Procedure: RIGHT RING A-1 PULLEY RELEASE    (MINOR PROCEDURE) ;  Surgeon: Cammie Sickle., MD;  Location: [facility];  Service: Orthopedics;  Laterality: Right;  . TRIGGER FINGER RELEASE Left 09/22/2016   Procedure: RELEASE TRIGGER FINGER LEFT INDEX FINGER;  Surgeon: Mcarthur Rossetti, MD;  Location: [place];  Service: Orthopedics;  Laterality: Left;  . TRIGGER FINGER RELEASE Right 01/20/2017   Procedure:  RELEASE TRIGGER FINGER/A-1 PULLEY RIGHT INDEX FINGER;  Surgeon: Melrose Nakayama, MD;  Location: [place];  Service: Orthopedics;  Laterality: Right;  . VITRECTOMY 25 GAUGE WITH SCLERAL BUCKLE Right 03/20/2018   Procedure: RIGHT EYE VITRECTOMY WITH  ENDOLASER PARENTAL PHOTOCOAGULATION 25 GAUGE;  Surgeon: Jalene Mullet, MD;  Location: [place];  Service: Ophthalmology;  Laterality: Right;  . WRIST ARTHROSCOPY WITH DEBRIDEMENT Right 05/01/2019   Procedure: WRIST ARTHROSCOPY WITH DEBRIDEMENT;  Surgeon: Charlotte Crumb, MD;  Location: [place];  Service: Orthopedics;  Laterality: Right;    There were no vitals filed for this visit.  Subjective Assessment - 07/17/19 1610    Subjective   Pt reports overusing hand on weekend moving a cabinet, but after ice it improved.    Pertinent History  s/p TFCC debridement 05/01/19, SL ligament fraying secondary to [facility] injury to Rt elbow in April 2020 from fall. DM Type II, HTN. 07/02/19 - DeQuervains tenosynovitis    Limitations  light duty, NO lifting > 10 lbs, brace on all the time at work    Currently in Pain?  Yes    Pain Score  3     Pain Location  Wrist    Pain Orientation  Right    Pain Descriptors / Indicators  Aching    Pain Type  Chronic pain    Pain Onset  More than a month ago    Pain Frequency  Intermittent    Aggravating Factors   overuse    Pain Relieving Factors  ice            Treatment: [name] 48mhz, 0.8 w/cm 2, 20% x 8 mins to right wrist , no adverse reactions. Discussed importance of balancing activity and rest. Discussed plans to wrap up after the next 2 visits. Pt sees Dr. Burney Gauze on 5/18 and pt wants to keep  herer chart open just in case. Pt is scheduled for 1 visit in June and she will cx that visit if it is not needed. Ice pack 8 mins to right wrist, no adverse reactions.                OT Treatment/Education - 07/17/19 1445    Education Details  reviewed previously issued HEP for  isometrics for wrist flexion/ extension then  wrist and thumb ROM separately, progressed to yellow putty exercises and added wrist flexion/ extension with 1 lbs weight.    Person(s) Educated  Patient    Methods  Explanation;Demonstration;Handout    Comprehension  Verbalized understanding;Returned demonstration          OT Long Term Goals - 07/17/19 1433      OT LONG TERM GOAL #1  Title  Independent with HEP for Rt wrist and hand    Time  4    Period  Weeks    Status  Achieved      OT LONG TERM GOAL #2   Title  Pt to verbalize understanding with A/E and task modifications to increase ease and decrease pain with functional tasks    Time  4    Period  Weeks    Status  Achieved      OT LONG TERM GOAL #3   Title  Grip strength Rt hand to increase to 46 lbs    Baseline  36 lbs    Time  4    Period  Weeks    Status  On-going      OT LONG TERM GOAL #4   Title  Lateral pinch to increase to 16 lbs Rt hand    Baseline  14 lbs    Time  4    Period  Weeks    Status  On-going      OT LONG TERM GOAL #5   Title  Pain overall to be 5/10 or under with all functional tasks using compensatory strategies/task modifications prn    Time  4    Period  Weeks    Status  On-going            Plan - 07/17/19 1604    Clinical Impression Statement  Pt is progressing slowly and steadily. Pt reports that her pain from deQuervain's is improved    Occupational performance deficits (Please refer to evaluation for details):  ADL's;IADL's;Work;Leisure    Body Structure / Function / Physical Skills  ADL;ROM;IADL;Edema;Body mechanics;Flexibility;Strength;Coordination;Pain;UE functional use    Rehab Potential  Good    OT Frequency  2x / week    OT Duration  4 weeks    OT Treatment/Interventions  Self-care/ADL training;Therapeutic exercise;Ultrasound;Manual Therapy;Splinting;Iontophoresis;Therapeutic activities;Cryotherapy;Paraffin;DME and/or AE instruction;Compression bandaging;Fluidtherapy;Moist  Heat;Passive range of motion;Patient/family education    Plan  continue modalities, A/ROM and P/ROM, light grip strengthening as tolerated, discuss activity modification, anticipate d/c after next 2 visits    Consulted and Agree with Plan of Care  Patient       Patient will benefit from skilled therapeutic intervention in order to improve the following deficits and impairments:   Body Structure / Function / Physical Skills: ADL, ROM, IADL, Edema, Body mechanics, Flexibility, Strength, Coordination, Pain, UE functional use       Visit Diagnosis: Pain in right wrist  Stiffness of right wrist, not elsewhere classified  Muscle weakness (generalized)    Problem List Patient Active Problem List   Diagnosis Date Noted  . Radial head fracture, closed 06/16/2018  . Closed fracture dislocation of elbow 06/16/2018  . Other fatigue 07/18/2017  . Shortness of breath on exertion 07/18/2017  . Type 2 diabetes mellitus with retinopathy, with long-term current use of insulin (HCC) 07/18/2017  . Vitamin D deficiency 07/18/2017  . B12 nutritional deficiency 07/18/2017  . Other specified hypothyroidism 07/18/2017  . Acute pain of right knee 12/22/2016  . Trigger index finger of left hand 09/22/2016  . 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 Diabetes (HCC) 01/08/2014  . Essential hypertension 01/08/2014  . Hyperlipidemia 01/08/2014  . Postoperative anemia due to acute blood loss 11/22/2012  . Hyponatremia 11/06/2012  . OA (osteoarthritis) of knee 11/05/2012  . Villonodular synovitis of knee 04/20/2011  Candy Ziegler 07/17/2019, 4:11 PM Keene Breath, OTR/L [phone] Phone: [phone] 6:00 PM 07/17/19 [facility] [address] [place], [place], [address] Phone:  [phone]   Fax:  [phone]  Name: [name] MRN: [id number] Date of Birth: 06-Jan-1951

## 2019-07-17 NOTE — Patient Instructions (Signed)
AROM: Wrist Extension   .  With __right__ palm down, bend wrist up.with  1bs Repeat __15__ times per set.  Do __1 sessions per day.    AROM: Wrist Flexion   With__right ___ palm up, bend wrist up. With 1 lbs weight Repeat __15__ times per set.  Do _1-_ sessions per day.     Use putty 1 x a day.

## 2019-07-18 ENCOUNTER — Ambulatory Visit: Payer: 59 | Admitting: Occupational Therapy

## 2019-07-22 ENCOUNTER — Other Ambulatory Visit: Payer: Self-pay

## 2019-07-22 ENCOUNTER — Encounter: Payer: Self-pay | Admitting: Occupational Therapy

## 2019-07-22 ENCOUNTER — Ambulatory Visit: Payer: 59 | Admitting: Occupational Therapy

## 2019-07-22 DIAGNOSIS — M25531 Pain in right wrist: Secondary | ICD-10-CM

## 2019-07-22 DIAGNOSIS — M25631 Stiffness of right wrist, not elsewhere classified: Secondary | ICD-10-CM

## 2019-07-22 DIAGNOSIS — M6281 Muscle weakness (generalized): Secondary | ICD-10-CM

## 2019-07-22 NOTE — Therapy (Signed)
[facility] [address] [place], [place], [address] Phone: [phone]   Fax:  [phone]  Occupational Therapy Treatment  Patient Details  Name: [name] MRN: [id number] Date of Birth: [birth year] Referring Provider (OT): Dr. Mina Marble   Encounter Date: 07/22/2019  OT End of Session - 07/22/19 1554    Visit Number  8    Number of Visits  9    Authorization Type  [facility] - Clinical eligibility needed after 25 visits    OT Start Time  1404    OT Stop Time  1450    OT Time Calculation (min)  46 min       Past Medical History:  Diagnosis Date  . A-fib (HCC)   . Anemia    hx of  . Arthritis   . Chest pain   . Constipation   . Depression   . Diabetes mellitus ORAL MEDS  . Diabetic retinopathy (HCC)   . Dyspnea    with minimal exertion-deconditioned  . Dyspnea   . Dysrhythmia    a-fib  . Food allergy   . GERD (gastroesophageal reflux disease)    occasional  . Gout   . History of kidney stones   . Hypercholesteremia   . Hyperlipidemia   . Hypertension   . Hypertensive kidney disease   . Hypothyroidism   . Knee pain, right   . 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 Obesity   . OSA on CPAP    uses CPAP nightly  . Osteoarthritis   . Palpitations   . Pinched nerve in neck   . Pleurisy   . Pneumonia    hx of  . PONV (postoperative nausea and vomiting)    for 3-4 days after general anesthesia approx 10-12 years ago  . Sciatica   . Swelling of knee joint, right   . Synovitis of knee RIGHT  . Trigger finger, left    left index  . Vitamin D deficiency     Past Surgical History:  Procedure Laterality Date  . CESAREAN SECTION  X3  . KNEE ARTHROSCOPY  04/20/2011   Procedure: ARTHROSCOPY KNEE;  Surgeon: Loanne Drilling, MD;  Location: [facility];  Service: Orthopedics;  Laterality: Right;  WITH SYNOVECTOMY  . LEFT CARPAL TUNNEL / LEFT MIDDLE & RING FINGER TRIGGER  RELEASE  08-26-2008  . LEFT SHOULDER ARTHROSCOPY W/ DEBRIDEMENT  09-09-2003  . LEFT SHOULDER ARTHROSCOPY/ LEFT THUMB TRIGGER RELEASE  02-22-2005  . PHOTOCOAGULATION WITH LASER Right 03/20/2018   Procedure: PHOTOCOAGULATION WITH LASER;  Surgeon: Carmela Rima, MD;  Location: [facility] OR;  Service: Ophthalmology;  Laterality: Right;  . PULLEY RELEASE LEFT LONG FINGER  07-14-2009  . RADIAL HEAD ARTHROPLASTY Right 06/17/2018   Procedure: RADIAL HEAD ARTHROPLASTY;  Surgeon: Bjorn Pippin, MD;  [facility];  Service: Orthopedics;  Laterality: Right;  . RIGHT CARPAL TUNNEL/ RIGHT THUMB TRIGGER RELEASE'S  11-28-2006  . RIGHT SHOULDER ARTHROSCOPY W/ ROTATOR CUFF REPAIR  01-13-2004  . SHOULDER ARTHROSCOPY DISTAL CLAVICLE EXCISION AND OPEN ROTATOR CUFF REPAIR  09-07-2004   LEFT  . SHOULDER ARTHROSCOPY W/ ACROMIAL REPAIR  11-29-2005   LEFT  . TONSILLECTOMY AND ADENOIDECTOMY  child  . TOTAL KNEE ARTHROPLASTY  12-14-2009   RIGHT  . TOTAL KNEE ARTHROPLASTY Left 11/05/2012   Procedure: LEFT TOTAL KNEE ARTHROPLASTY;  Surgeon: Loanne Drilling, MD;  [facility];  Service: Orthopedics;  Laterality: Left;  . TOTAL KNEE REVISION  Right 07/29/2016   Procedure: RIGHT KNEE POLY-LINER EXCHANGE;  Surgeon: Kathryne Hitch, MD;  [facility];  Service: Orthopedics;  Laterality: Right;  . TRIGGER FINGER RELEASE Right 02/21/2013   Procedure: RIGHT RING A-1 PULLEY RELEASE    (MINOR PROCEDURE) ;  Surgeon: Wyn Forster., MD;  [facility];  Service: Orthopedics;  Laterality: Right;  . TRIGGER FINGER RELEASE Left 09/22/2016   Procedure: RELEASE TRIGGER FINGER LEFT INDEX FINGER;  Surgeon: Kathryne Hitch, MD;  [facility];  Service: Orthopedics;  Laterality: Left;  . TRIGGER FINGER RELEASE Right 01/20/2017   Procedure: RELEASE TRIGGER FINGER/A-1 PULLEY RIGHT INDEX FINGER;  Surgeon: Marcene Corning, MD;  Location: [facility];  Service: Orthopedics;  Laterality:  Right;  . VITRECTOMY 25 GAUGE WITH SCLERAL BUCKLE Right 03/20/2018   Procedure: RIGHT EYE VITRECTOMY WITH  ENDOLASER PARENTAL PHOTOCOAGULATION 25 GAUGE;  Surgeon: Carmela Rima, MD;  Location: [facility] OR;  Service: Ophthalmology;  Laterality: Right;  . WRIST ARTHROSCOPY WITH DEBRIDEMENT Right 05/01/2019   Procedure: WRIST ARTHROSCOPY WITH DEBRIDEMENT;  Surgeon: Dairl Ponder, MD;  Location: [facility];  Service: Orthopedics;  Laterality: Right;    There were no vitals filed for this visit.  Subjective Assessment - 07/22/19 1411    Subjective   We don't have volunteers, so I try not to, but sometimes I still have to push a wheelchair.    Currently in Pain?  Yes    Pain Score  1     Pain Location  Wrist    Pain Orientation  Right    Pain Descriptors / Indicators  Aching    Pain Type  Chronic pain    Pain Onset  More than a month ago    Pain Frequency  Intermittent    Aggravating Factors   Pulling                   OT Treatments/Exercises (OP) - 07/22/19 0001      ADLs   Cooking  Discussed carrying heavy crock pot, and options to avoid wrist pain.      ADL Comments  Patient reports overall decrease in pain, but still has moments of pain flare with pushing, pulling lifting with RUE.  Discussed starting to pay attention to specific tasks which are provoking pain so we can look at modifications.  Patient on track to discharge after next 2 visits.  Patient in agreement.        Modalities   Modalities  Cryotherapy      Cryotherapy   Number Minutes Cryotherapy  10 Minutes   ice to wrist after session     Manual Therapy   Manual therapy comments  Rt forearm and over surgical site.  Patient with stiffness in ulnar aspect of wrist.                    OT Long Term Goals - 07/22/19 1420      OT LONG TERM GOAL #1   Title  Independent with HEP for Rt wrist and hand    Status  Achieved      OT LONG TERM GOAL #2   Title  Pt to verbalize understanding  with A/E and task modifications to increase ease and decrease pain with functional tasks    Status  Achieved      OT LONG TERM GOAL #3   Title  Grip strength Rt hand to increase to 46 lbs  Status  On-going   56     OT LONG TERM GOAL #4   Title  Lateral pinch to increase to 16 lbs Rt hand    Status  Achieved            Plan - 07/22/19 1556    Clinical Impression Statement  Patient reports overall pain is less, AROM remains stable, grip and pinch strength have improved    OT Frequency  2x / week    OT Duration  4 weeks    OT Treatment/Interventions  Self-care/ADL training;Therapeutic exercise;Ultrasound;Manual Therapy;Splinting;Iontophoresis;Therapeutic activities;Cryotherapy;Paraffin;DME and/or AE instruction;Compression bandaging;Fluidtherapy;Moist Heat;Passive range of motion;Patient/family education    Plan  continue modalities, A/ROM and P/ROM, light grip strengthening as tolerated, aticipate d/c after next  visit       Patient will benefit from skilled therapeutic intervention in order to improve the following deficits and impairments:           Visit Diagnosis: Pain in right wrist  Stiffness of right wrist, not elsewhere classified  Muscle weakness (generalized)    Problem List Patient Active Problem List   Diagnosis Date Noted  . Radial head fracture, closed 06/16/2018  . Closed fracture dislocation of elbow 06/16/2018  . Other fatigue 07/18/2017  . Shortness of breath on exertion 07/18/2017  . Type 2 diabetes mellitus with retinopathy, with long-term current use of insulin ([place]) 07/18/2017  . Vitamin D deficiency 07/18/2017  . B12 nutritional deficiency 07/18/2017  . Other specified hypothyroidism 07/18/2017  . Acute pain of right knee 12/22/2016  . Trigger index finger of left hand 09/22/2016  . 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 Diabetes ([place]) 01/08/2014  . Essential hypertension 01/08/2014  . Hyperlipidemia 01/08/2014  . Postoperative anemia due to acute blood loss 11/22/2012  . Hyponatremia 11/06/2012  . OA (osteoarthritis) of knee 11/05/2012  . Villonodular synovitis of knee 04/20/2011    Mariah Milling, OTR/L 07/22/2019, 3:57 PM  [facility] [address] [place], [place], [address] Phone: [phone]   Fax:  [phone]  Name: [name] MRN: [id number] Date of Birth: 1950/04/02

## 2019-07-24 DIAGNOSIS — E113511 Type 2 diabetes mellitus with proliferative diabetic retinopathy with macular edema, right eye: Secondary | ICD-10-CM | POA: Diagnosis not present

## 2019-07-25 ENCOUNTER — Encounter: Payer: 59 | Admitting: Occupational Therapy

## 2019-07-25 ENCOUNTER — Other Ambulatory Visit: Payer: Self-pay

## 2019-07-25 ENCOUNTER — Other Ambulatory Visit (HOSPITAL_COMMUNITY): Payer: Self-pay | Admitting: Internal Medicine

## 2019-07-25 ENCOUNTER — Ambulatory Visit: Payer: 59 | Admitting: Occupational Therapy

## 2019-07-25 DIAGNOSIS — M25531 Pain in right wrist: Secondary | ICD-10-CM | POA: Diagnosis not present

## 2019-07-25 DIAGNOSIS — M6281 Muscle weakness (generalized): Secondary | ICD-10-CM

## 2019-07-25 DIAGNOSIS — M25631 Stiffness of right wrist, not elsewhere classified: Secondary | ICD-10-CM | POA: Diagnosis not present

## 2019-07-25 NOTE — Therapy (Signed)
[facility] [address] [place], [place], [address] Phone: [phone]   Fax:  [phone]  Occupational Therapy Treatment  Patient Details  Name: [name] MRN: [id number] Date of Birth: 1950-07-04 Referring Provider (OT): Dr. Burney Gauze   Encounter Date: 07/25/2019  OT End of Session - 07/25/19 1715    Visit Number  9    Number of Visits  9    Authorization Type  [facility] - Clinical eligibility needed after 25 visits    OT Start Time  3559    OT Stop Time  1700    OT Time Calculation (min)  45 min    Activity Tolerance  Patient tolerated treatment well    Behavior During Therapy  [facility] for tasks assessed/performed       Past Medical History:  Diagnosis Date  . A-fib ([place])   . Anemia    hx of  . Arthritis   . Chest pain   . Constipation   . Depression   . Diabetes mellitus [facility]  . Diabetic retinopathy ([place])   . Dyspnea    with minimal exertion-deconditioned  . Dyspnea   . Dysrhythmia    a-fib  . Food allergy   . GERD (gastroesophageal reflux disease)    occasional  . Gout   . History of kidney stones   . Hypercholesteremia   . Hyperlipidemia   . Hypertension   . Hypertensive kidney disease   . Hypothyroidism   . Knee pain, right   . 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 Obesity   . OSA on CPAP    uses CPAP nightly  . Osteoarthritis   . Palpitations   . Pinched nerve in neck   . Pleurisy   . Pneumonia    hx of  . PONV (postoperative nausea and vomiting)    for 3-4 days after general anesthesia approx 10-12 years ago  . Sciatica   . Swelling of knee joint, right   . Synovitis of knee RIGHT  . Trigger finger, left    left index  . Vitamin D deficiency     Past Surgical History:  Procedure Laterality Date  . CESAREAN SECTION  X3  . KNEE ARTHROSCOPY  04/20/2011   Procedure: ARTHROSCOPY KNEE;  Surgeon: Gearlean Alf, MD;  Location: [facility];   Service: Orthopedics;  Laterality: Right;  WITH SYNOVECTOMY  . LEFT CARPAL TUNNEL / LEFT MIDDLE & RING FINGER TRIGGER RELEASE  08-26-2008  . LEFT SHOULDER ARTHROSCOPY W/ DEBRIDEMENT  09-09-2003  . LEFT SHOULDER ARTHROSCOPY/ LEFT THUMB TRIGGER RELEASE  02-22-2005  . PHOTOCOAGULATION WITH LASER Right 03/20/2018   Procedure: PHOTOCOAGULATION WITH LASER;  Surgeon: Jalene Mullet, MD;  Location: [facility];  Service: Ophthalmology;  Laterality: Right;  . PULLEY RELEASE LEFT LONG FINGER  07-14-2009  . RADIAL HEAD ARTHROPLASTY Right 06/17/2018   Procedure: RADIAL HEAD ARTHROPLASTY;  Surgeon: Hiram Gash, MD;  Location: [facility];  Service: Orthopedics;  Laterality: Right;  . RIGHT CARPAL TUNNEL/ RIGHT THUMB TRIGGER RELEASE'S  11-28-2006  . RIGHT SHOULDER ARTHROSCOPY W/ ROTATOR CUFF REPAIR  01-13-2004  . SHOULDER ARTHROSCOPY DISTAL CLAVICLE EXCISION AND OPEN ROTATOR CUFF REPAIR  09-07-2004   LEFT  . SHOULDER ARTHROSCOPY W/ ACROMIAL REPAIR  11-29-2005   LEFT  . TONSILLECTOMY AND ADENOIDECTOMY  child  . TOTAL KNEE ARTHROPLASTY  12-14-2009   RIGHT  . TOTAL KNEE ARTHROPLASTY Left 11/05/2012   Procedure: LEFT TOTAL KNEE ARTHROPLASTY;  Surgeon: Gearlean Alf, MD;  Location: [facility];  Service: Orthopedics;  Laterality: Left;  . TOTAL KNEE REVISION Right 07/29/2016   Procedure: RIGHT KNEE POLY-LINER EXCHANGE;  Surgeon: Mcarthur Rossetti, MD;  Location: [facility];  Service: Orthopedics;  Laterality: Right;  . TRIGGER FINGER RELEASE Right 02/21/2013   Procedure: RIGHT RING A-1 PULLEY RELEASE    (MINOR PROCEDURE) ;  Surgeon: Cammie Sickle., MD;  Location: [facility];  Service: Orthopedics;  Laterality: Right;  . TRIGGER FINGER RELEASE Left 09/22/2016   Procedure: RELEASE TRIGGER FINGER LEFT INDEX FINGER;  Surgeon: Mcarthur Rossetti, MD;  Location: [facility];  Service: Orthopedics;  Laterality: Left;  . TRIGGER FINGER RELEASE Right 01/20/2017   Procedure: RELEASE TRIGGER FINGER/A-1 PULLEY RIGHT  INDEX FINGER;  Surgeon: Melrose Nakayama, MD;  Location: [facility];  Service: Orthopedics;  Laterality: Right;  . VITRECTOMY 25 GAUGE WITH SCLERAL BUCKLE Right 03/20/2018   Procedure: RIGHT EYE VITRECTOMY WITH  ENDOLASER PARENTAL PHOTOCOAGULATION 25 GAUGE;  Surgeon: Jalene Mullet, MD;  Location: [facility];  Service: Ophthalmology;  Laterality: Right;  . WRIST ARTHROSCOPY WITH DEBRIDEMENT Right 05/01/2019   Procedure: WRIST ARTHROSCOPY WITH DEBRIDEMENT;  Surgeon: Charlotte Crumb, MD;  Location: [facility];  Service: Orthopedics;  Laterality: Right;    There were no vitals filed for this visit.  Subjective Assessment - 07/25/19 1623    Pertinent History  s/p TFCC debridement 05/01/19, SL ligament fraying secondary to Triad injury to Rt elbow in April 2020 from fall. DM Type II, HTN. 07/02/19 - DeQuervains tenosynovitis    Limitations  light duty, NO lifting > 10 lbs, brace on all the time at work    Special Tests  decrease pain, back to using Rt hand and able to lift things    Patient Stated Goals  decrease pain    Currently in Pain?  Yes    Pain Score  2     Pain Location  Wrist    Pain Orientation  Right    Pain Descriptors / Indicators  Aching    Pain Type  Chronic pain    Pain Onset  More than a month ago    Pain Frequency  Intermittent    Aggravating Factors   pulling    Pain Relieving Factors  ice        Reviewed progress to date and progress towards goals. Reviewed task modifications, adaptations, A/E recommendations, and joint protection strategies. Grip strength Rt = 57 lbs Wrist flexion, extension, RD x 10 reps each with 1 lb weight. Pt instructed to only increase weight after 1-2 weeks and if no increase in pain (soreness ok).                           OT Long Term Goals - 07/25/19 1637      OT LONG TERM GOAL #1   Title  Independent with HEP for Rt wrist and hand    Time  4    Period  Weeks    Status  Achieved      OT  LONG TERM GOAL #2   Title  Pt to verbalize understanding with A/E and task modifications to increase ease and decrease pain with functional tasks    Time  4    Period  Weeks    Status  Achieved      OT LONG TERM GOAL #3   Title  Grip strength Rt  hand to increase to 46 lbs    Baseline  36 lbs    Time  4    Period  Weeks    Status  Achieved   57 lbs     OT LONG TERM GOAL #4   Title  Lateral pinch to increase to 16 lbs Rt hand    Baseline  14 lbs    Time  4    Period  Weeks    Status  Achieved      OT LONG TERM GOAL #5   Title  Pain overall to be 5/10 or under with all functional tasks using compensatory strategies/task modifications prn    Time  4    Period  Weeks    Status  Achieved            Plan - 07/25/19 1639    Clinical Impression Statement  Pt has met all LTG's.    Occupational performance deficits (Please refer to evaluation for details):  ADL's;IADL's;Work;Leisure    Body Structure / Function / Physical Skills  ADL;ROM;IADL;Edema;Body mechanics;Flexibility;Strength;Coordination;Pain;UE functional use    Rehab Potential  Good    OT Frequency  2x / week    OT Duration  4 weeks    OT Treatment/Interventions  Self-care/ADL training;Therapeutic exercise;Ultrasound;Manual Therapy;Splinting;Iontophoresis;Therapeutic activities;Cryotherapy;Paraffin;DME and/or AE instruction;Compression bandaging;Fluidtherapy;Moist Heat;Passive range of motion;Patient/family education    Plan  D/C O.T.    Consulted and Agree with Plan of Care  Patient       Patient will benefit from skilled therapeutic intervention in order to improve the following deficits and impairments:   Body Structure / Function / Physical Skills: ADL, ROM, IADL, Edema, Body mechanics, Flexibility, Strength, Coordination, Pain, UE functional use       Visit Diagnosis: Pain in right wrist  Stiffness of right wrist, not elsewhere classified  Muscle weakness (generalized)    Problem List Patient Active  Problem List   Diagnosis Date Noted  . Radial head fracture, closed 06/16/2018  . Closed fracture dislocation of elbow 06/16/2018  . Other fatigue 07/18/2017  . Shortness of breath on exertion 07/18/2017  . Type 2 diabetes mellitus with retinopathy, with long-term current use of insulin ([place]) 07/18/2017  . Vitamin D deficiency 07/18/2017  . B12 nutritional deficiency 07/18/2017  . Other specified hypothyroidism 07/18/2017  . Acute pain of right knee 12/22/2016  . Trigger index finger of left hand 09/22/2016  . 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 Diabetes ([place]) 01/08/2014  . Essential hypertension 01/08/2014  . Hyperlipidemia 01/08/2014  . Postoperative anemia due to acute blood loss 11/22/2012  . Hyponatremia 11/06/2012  . OA (osteoarthritis) of knee 11/05/2012  . Villonodular synovitis of knee 04/20/2011    OCCUPATIONAL THERAPY DISCHARGE SUMMARY  Visits from Start of Care: 9  Current functional level related to goals / functional outcomes: See above   Remaining deficits: Pain Rt wrist Strength   Education / Equipment: HEP's, task modifications, A/E recommendations, joint protection strategies  Plan: Patient agrees to discharge.  Patient goals were met. Patient is being discharged due to meeting the stated rehab goals.  ?????       Carey Bullocks, OTR/L 07/25/2019, 5:15 PM  [facility] [address] [place], [place], [address] Phone: [phone]   Fax:  [phone]  Name: [name] MRN: [id number] Date of Birth: May 23, 1950

## 2019-07-25 NOTE — Progress Notes (Signed)
Cardiology Office Note   Date:  08/02/2019   ID:  [name], DOB May 12, 1950, MRN [id number]  PCP:  Marden Noble, MD  Cardiologist:   Charlton Haws, MD   No chief complaint on file.     History of Present Illness: [name] is a 69 y.o. female who presents for f/u of PAF. She has been Rx with flecainide, xarelto and beta blocker  TTE March 2018 and 2019  normal EF and normal LA size. Normal myovue 07/21/16  Has had multiple orthopedic surgeries last  2 years and held NOAC with no issues Also lumbar spine injections CHADVASC 4 no bleeding issues   She injured her back and left foot. Weight is up and DM poorly controlled Weight loss doctor wanted to start Wellbutrin but interacts with flecainide I told her we could lower dose and monitor ECG if she needed to use this medicine as her weight is a bigger health issue than her PAF at this time as she has maintained NSR for a while   Her last surgery with on right wrist with Dr Mina Marble 05/01/19 no issues holding xarelto for 48 hours Has also had  Two different eye surgeries and hurt her elbow  Past Medical History:  Diagnosis Date  . A-fib ([facility])   . Anemia    hx of  . Arthritis   . Chest pain   . Constipation   . Depression   . Diabetes mellitus [facility]  . Diabetic retinopathy ([facility])   . Dyspnea    with minimal exertion-deconditioned  . Dyspnea   . Dysrhythmia    a-fib  . Food allergy   . GERD (gastroesophageal reflux disease)    occasional  . Gout   . History of kidney stones   . Hypercholesteremia   . Hyperlipidemia   . Hypertension   . Hypertensive kidney disease   . Hypothyroidism   . Knee pain, right   . 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 Obesity   . OSA on CPAP    uses CPAP nightly  . Osteoarthritis   . Palpitations   . Pinched nerve in neck   . Pleurisy   . Pneumonia    hx of  . PONV (postoperative nausea and vomiting)    for 3-4 days after general anesthesia approx 10-12 years  ago  . Sciatica   . Swelling of knee joint, right   . Synovitis of knee RIGHT  . Trigger finger, left    left index  . Vitamin D deficiency     Past Surgical History:  Procedure Laterality Date  . CESAREAN SECTION  X3  . KNEE ARTHROSCOPY  04/20/2011   Procedure: ARTHROSCOPY KNEE;  Surgeon: Loanne Drilling, MD;  Location: [facility];  Service: Orthopedics;  Laterality: Right;  WITH SYNOVECTOMY  . LEFT CARPAL TUNNEL / LEFT MIDDLE & RING FINGER TRIGGER RELEASE  08-26-2008  . LEFT SHOULDER ARTHROSCOPY W/ DEBRIDEMENT  09-09-2003  . LEFT SHOULDER ARTHROSCOPY/ LEFT THUMB TRIGGER RELEASE  02-22-2005  . PHOTOCOAGULATION WITH LASER Right 03/20/2018   Procedure: PHOTOCOAGULATION WITH LASER;  Surgeon: Carmela Rima, MD;  Location: [facility];  Service: Ophthalmology;  Laterality: Right;  . PULLEY RELEASE LEFT LONG FINGER  07-14-2009  . RADIAL HEAD ARTHROPLASTY Right 06/17/2018   Procedure: RADIAL HEAD ARTHROPLASTY;  Surgeon: Bjorn Pippin, MD;  Location[facility];  Service: Orthopedics;  Laterality: Right;  . RIGHT CARPAL TUNNEL/ RIGHT THUMB TRIGGER RELEASE'S  11-28-2006  . RIGHT SHOULDER ARTHROSCOPY W/ ROTATOR CUFF REPAIR  01-13-2004  . SHOULDER ARTHROSCOPY DISTAL CLAVICLE EXCISION AND OPEN ROTATOR CUFF REPAIR  09-07-2004   LEFT  . SHOULDER ARTHROSCOPY W/ ACROMIAL REPAIR  11-29-2005   LEFT  . TONSILLECTOMY AND ADENOIDECTOMY  child  . TOTAL KNEE ARTHROPLASTY  12-14-2009   RIGHT  . TOTAL KNEE ARTHROPLASTY Left 11/05/2012   Procedure: LEFT TOTAL KNEE ARTHROPLASTY;  Surgeon: Loanne Drilling, MD;  Location: [facility];  Service: Orthopedics;  Laterality: Left;  . TOTAL KNEE REVISION Right 07/29/2016   Procedure: RIGHT KNEE POLY-LINER EXCHANGE;  Surgeon: Kathryne Hitch, MD;  Location: [facility];  Service: Orthopedics;  Laterality: Right;  . TRIGGER FINGER RELEASE Right 02/21/2013   Procedure: RIGHT RING A-1 PULLEY RELEASE    (MINOR PROCEDURE) ;  Surgeon: Wyn Forster., MD;  Location:  [facility];  Service: Orthopedics;  Laterality: Right;  . TRIGGER FINGER RELEASE Left 09/22/2016   Procedure: RELEASE TRIGGER FINGER LEFT INDEX FINGER;  Surgeon: Kathryne Hitch, MD;  Location[facility];  Service: Orthopedics;  Laterality: Left;  . TRIGGER FINGER RELEASE Right 01/20/2017   Procedure: RELEASE TRIGGER FINGER/A-1 PULLEY RIGHT INDEX FINGER;  Surgeon: Marcene Corning, MD;  Location: [facility];  Service: Orthopedics;  Laterality: Right;  . VITRECTOMY 25 GAUGE WITH SCLERAL BUCKLE Right 03/20/2018   Procedure: RIGHT EYE VITRECTOMY WITH  ENDOLASER PARENTAL PHOTOCOAGULATION 25 GAUGE;  Surgeon: Carmela Rima, MD;  Location: [facility];  Service: Ophthalmology;  Laterality: Right;  . WRIST ARTHROSCOPY WITH DEBRIDEMENT Right 05/01/2019   Procedure: WRIST ARTHROSCOPY WITH DEBRIDEMENT;  Surgeon: Dairl Ponder, MD;  Location: [facility];  Service: Orthopedics;  Laterality: Right;     Current Outpatient Medications  Medication Sig Dispense Refill  . Aflibercept (EYLEA) 2 MG/0.05ML SOLN 2 mg by Intravitreal route every 30 (thirty) days.    [name] amLODipine (NORVASC) 5 MG tablet Take 5 mg by mouth every evening.     [name] atorvastatin (LIPITOR) 40 MG tablet Take 40 mg by mouth every evening.     . calcium carbonate (CALCIUM 600) 600 MG TABS tablet Take 600 mg by mouth daily with breakfast.    . cholecalciferol (VITAMIN D3) 25 MCG (1000 UT) tablet Take 1,000 Units by mouth daily.    . DULoxetine (CYMBALTA) 60 MG capsule Take 60 mg by mouth daily.    . flecainide (TAMBOCOR) 50 MG tablet TAKE 1 TABLET BY MOUTH TWO TIMES DAILY (PLEASE KEEP MARCH APPOINTMENT) 180 tablet 3  . hydrochlorothiazide (MICROZIDE) 12.5 MG capsule Take 12.5 mg by mouth daily.   3  . HYDROcodone-acetaminophen (NORCO/VICODIN) 5-325 MG tablet TAKE 1 TABLET BY MOUTH EVERY 6 HOURS AS NEEDED FOR MODERATE PAIN 30 tablet 0  . insulin glargine (LANTUS) 100 UNIT/ML injection Inject 48 Units into  the skin 2 (two) times daily.     . Levothyroxine Sodium 137 MCG CAPS Take 137 mcg by mouth daily before breakfast.     . Melatonin 5 MG CAPS Take 5-10 mg by mouth at bedtime as needed (sleep).     . metFORMIN (GLUCOPHAGE-XR) 500 MG 24 hr tablet Take 1,000 mg by mouth daily with supper.    . methocarbamol (ROBAXIN) 500 MG tablet TAKE 1 TABLET BY MOUTH EVERY 6 HOURS AS NEEDED FOR MUSCLE SPASMS 60 tablet 1  . metoprolol tartrate (LOPRESSOR) 50 MG tablet Take 1 tablet (50 mg total) by mouth 2 (two) times daily. 180 tablet 3  . omeprazole (  PRILOSEC) 20 MG capsule Take 20 mg by mouth daily.     . TRULICITY 3 HY/0.7PX SOPN Inject 3 mg as directed every Sunday.   11  . valsartan (DIOVAN) 320 MG tablet Take 320 mg by mouth daily.   3  . XARELTO 20 MG TABS tablet TAKE 1 TABLET BY MOUTH ONCE DAILY WITH SUPPER 90 tablet 0   No current facility-administered medications for this visit.    Allergies:   Actos [pioglitazone hydrochloride], Avandia [rosiglitazone], Tetanus toxoids, and Food    Social History:  The patient  reports that she quit smoking about 42 years ago. Her smoking use included cigarettes. She quit after 5.00 years of use. She has never used smokeless tobacco. She reports that she does not drink alcohol or use drugs.   Family History:  The patient's family history includes Alcoholism in her father; Cancer in her father; Diabetes in her mother; Heart attack in her mother; Hypertension in her mother; Liver disease in her father; Obesity in her mother; Sleep apnea in her father; Thyroid disease in her mother.    ROS:  Please see the history of present illness.   Otherwise, review of systems are positive for none.   All other systems are reviewed and negative.    PHYSICAL EXAM: VS:  BP (!) 143/76   Pulse 66   Ht 5\' 2"  (1.575 m)   Wt 264 lb 1.9 oz (119.8 kg)   SpO2 93%   BMI 48.31 kg/m  , BMI Body mass index is 48.31 kg/m. Affect appropriate Healthy:  appears stated age 51:  normal Neck supple with no adenopathy JVP normal no bruits no thyromegaly Lungs clear with no wheezing and good diaphragmatic motion Heart:  S1/S2 no murmur, no rub, gallop or click PMI normal Abdomen: benighn, BS positve, no tenderness, no AAA no bruit.  No HSM or HJR Distal pulses intact with no bruits No edema Neuro non-focal Skin warm and dry Post right wrist surgery     EKG:  08/02/19 SR rate 66 normal    Recent Labs: 02/28/2019: ALT 26 04/26/2019: BUN 31; Creatinine, Ser 1.23; Potassium 4.3; Sodium 137    Lipid Panel    Component Value Date/Time   CHOL 126 07/18/2017 1112   TRIG 98 07/18/2017 1112   HDL 43 07/18/2017 1112   LDLCALC 63 07/18/2017 1112      Wt Readings from Last 3 Encounters:  08/02/19 264 lb 1.9 oz (119.8 kg)  05/01/19 258 lb 13.1 oz (117.4 kg)  02/28/19 252 lb (114.3 kg)      Other studies Reviewed: Additional studies/ records that were reviewed today include: notes from othro, labs TTE 2018/2019 myovue ECG;s .    ASSESSMENT AND PLAN:  1.  PAF:  Maintaining NSR continue flecainide  2. Anticoagulation no bleeding issues continue Xarelto   3. HTN:  Well controlled.  Continue current medications and low sodium Dash type diet.    4. HLD  Continue statin labs with primary   5. Obesity:  F/u weight loss clinic she will call us if she is to start Wellbutrin so we can lower flecainide dose and watch ECG discussed diet and exercise    Current medicines are reviewed at length with the patient today.  The patient does not have concerns regarding medicines.  The following changes have been made:  no change  Labs/ tests ordered today include: None  No orders of the defined types were placed in this encounter.    Disposition:  FU with cardiology in a year      Signed, Charlton Haws, MD  08/02/2019 3:45 PM    [facility] [address] [place], [place], [place]  [address] Phone: [phone]; Fax: [phone]

## 2019-07-30 DIAGNOSIS — H25811 Combined forms of age-related cataract, right eye: Secondary | ICD-10-CM | POA: Diagnosis not present

## 2019-07-30 DIAGNOSIS — H2511 Age-related nuclear cataract, right eye: Secondary | ICD-10-CM | POA: Diagnosis not present

## 2019-08-02 ENCOUNTER — Encounter: Payer: Self-pay | Admitting: Cardiovascular Disease

## 2019-08-02 ENCOUNTER — Ambulatory Visit (INDEPENDENT_AMBULATORY_CARE_PROVIDER_SITE_OTHER): Payer: 59 | Admitting: Cardiovascular Disease

## 2019-08-02 ENCOUNTER — Other Ambulatory Visit: Payer: Self-pay

## 2019-08-02 VITALS — BP 143/76 | HR 66 | Ht 62.0 in | Wt 264.1 lb

## 2019-08-02 DIAGNOSIS — I480 Paroxysmal atrial fibrillation: Secondary | ICD-10-CM | POA: Diagnosis not present

## 2019-08-02 NOTE — Patient Instructions (Signed)

## 2019-08-05 DIAGNOSIS — N390 Urinary tract infection, site not specified: Secondary | ICD-10-CM | POA: Diagnosis not present

## 2019-08-05 DIAGNOSIS — N201 Calculus of ureter: Secondary | ICD-10-CM | POA: Diagnosis not present

## 2019-08-05 DIAGNOSIS — R1084 Generalized abdominal pain: Secondary | ICD-10-CM | POA: Diagnosis present

## 2019-08-05 DIAGNOSIS — N132 Hydronephrosis with renal and ureteral calculous obstruction: Secondary | ICD-10-CM | POA: Diagnosis not present

## 2019-08-05 DIAGNOSIS — Z87891 Personal history of nicotine dependence: Secondary | ICD-10-CM | POA: Insufficient documentation

## 2019-08-05 DIAGNOSIS — I129 Hypertensive chronic kidney disease with stage 1 through stage 4 chronic kidney disease, or unspecified chronic kidney disease: Secondary | ICD-10-CM | POA: Insufficient documentation

## 2019-08-05 DIAGNOSIS — R319 Hematuria, unspecified: Secondary | ICD-10-CM | POA: Diagnosis not present

## 2019-08-05 DIAGNOSIS — N189 Chronic kidney disease, unspecified: Secondary | ICD-10-CM | POA: Insufficient documentation

## 2019-08-05 DIAGNOSIS — N200 Calculus of kidney: Secondary | ICD-10-CM | POA: Insufficient documentation

## 2019-08-05 DIAGNOSIS — E1165 Type 2 diabetes mellitus with hyperglycemia: Secondary | ICD-10-CM | POA: Insufficient documentation

## 2019-08-06 ENCOUNTER — Emergency Department (HOSPITAL_COMMUNITY): Payer: 59

## 2019-08-06 ENCOUNTER — Other Ambulatory Visit: Payer: Self-pay

## 2019-08-06 ENCOUNTER — Encounter (HOSPITAL_COMMUNITY): Payer: Self-pay

## 2019-08-06 ENCOUNTER — Emergency Department (HOSPITAL_COMMUNITY)
Admission: EM | Admit: 2019-08-06 | Discharge: 2019-08-06 | Disposition: A | Discharge status: Home / Self Care | Payer: 59 | Attending: Emergency Medicine | Admitting: Emergency Medicine

## 2019-08-06 DIAGNOSIS — R109 Unspecified abdominal pain: Secondary | ICD-10-CM

## 2019-08-06 DIAGNOSIS — E1165 Type 2 diabetes mellitus with hyperglycemia: Secondary | ICD-10-CM | POA: Diagnosis not present

## 2019-08-06 DIAGNOSIS — R319 Hematuria, unspecified: Secondary | ICD-10-CM

## 2019-08-06 DIAGNOSIS — I129 Hypertensive chronic kidney disease with stage 1 through stage 4 chronic kidney disease, or unspecified chronic kidney disease: Secondary | ICD-10-CM | POA: Diagnosis not present

## 2019-08-06 DIAGNOSIS — N132 Hydronephrosis with renal and ureteral calculous obstruction: Secondary | ICD-10-CM | POA: Diagnosis not present

## 2019-08-06 DIAGNOSIS — N200 Calculus of kidney: Secondary | ICD-10-CM | POA: Diagnosis not present

## 2019-08-06 DIAGNOSIS — N390 Urinary tract infection, site not specified: Secondary | ICD-10-CM | POA: Diagnosis not present

## 2019-08-06 DIAGNOSIS — N189 Chronic kidney disease, unspecified: Secondary | ICD-10-CM | POA: Diagnosis not present

## 2019-08-06 DIAGNOSIS — N201 Calculus of ureter: Secondary | ICD-10-CM

## 2019-08-06 DIAGNOSIS — Z87891 Personal history of nicotine dependence: Secondary | ICD-10-CM | POA: Diagnosis not present

## 2019-08-06 LAB — URINALYSIS, ROUTINE W REFLEX MICROSCOPIC
Bilirubin Urine: NEGATIVE
Glucose, UA: 150 mg/dL — AB
Ketones, ur: NEGATIVE mg/dL
Leukocytes,Ua: NEGATIVE
Nitrite: NEGATIVE
Protein, ur: 100 mg/dL — AB
RBC / HPF: >50 RBC/hpf — ABNORMAL HIGH (ref 0–5)
Specific Gravity, Urine: 1.018 (ref 1.005–1.030)
WBC, UA: >50 WBC/hpf — ABNORMAL HIGH (ref 0–5)
pH: 6 (ref 5.0–8.0)

## 2019-08-06 LAB — BASIC METABOLIC PANEL
Anion gap: 10 (ref 5–15)
BUN: 36 mg/dL — ABNORMAL HIGH (ref 8–23)
CO2: 23 mmol/L (ref 22–32)
Calcium: 9.4 mg/dL (ref 8.9–10.3)
Chloride: 105 mmol/L (ref 98–111)
Creatinine, Ser: 1.43 mg/dL — ABNORMAL HIGH (ref 0.44–1.00)
GFR calc Af Amer: 43 mL/min — ABNORMAL LOW (ref >60)
GFR calc non Af Amer: 37 mL/min — ABNORMAL LOW (ref >60)
Glucose, Bld: 226 mg/dL — ABNORMAL HIGH (ref 70–99)
Potassium: 4.3 mmol/L (ref 3.5–5.1)
Sodium: 138 mmol/L (ref 135–145)

## 2019-08-06 LAB — CBC
HCT: 42.7 % (ref 36.0–46.0)
Hemoglobin: 13.4 g/dL (ref 12.0–15.0)
MCH: 29.3 pg (ref 26.0–34.0)
MCHC: 31.4 g/dL (ref 30.0–36.0)
MCV: 93.4 fL (ref 80.0–100.0)
Platelets: 323 10*3/uL (ref 150–400)
RBC: 4.57 MIL/uL (ref 3.87–5.11)
RDW: 14.6 % (ref 11.5–15.5)
WBC: 11 10*3/uL — ABNORMAL HIGH (ref 4.0–10.5)
nRBC: 0 % (ref 0.0–0.2)

## 2019-08-06 MED ORDER — CEPHALEXIN 500 MG PO CAPS
500.0000 mg | ORAL_CAPSULE | Freq: Once | ORAL | Status: AC
  Administered 2019-08-06: 500 mg via ORAL
  Filled 2019-08-06: qty 1

## 2019-08-06 MED ORDER — OXYCODONE-ACETAMINOPHEN 5-325 MG PO TABS
1.0000 | ORAL_TABLET | Freq: Once | ORAL | Status: AC
  Administered 2019-08-06: 1 via ORAL
  Filled 2019-08-06: qty 1

## 2019-08-06 MED ORDER — TAMSULOSIN HCL 0.4 MG PO CAPS
ORAL_CAPSULE | ORAL | 0 refills | Status: DC
Start: 2019-08-06 — End: 2021-07-21

## 2019-08-06 MED ORDER — ONDANSETRON HCL 4 MG PO TABS
4.0000 mg | ORAL_TABLET | Freq: Three times a day (TID) | ORAL | 0 refills | Status: DC | PRN
Start: 2019-08-06 — End: 2021-07-21

## 2019-08-06 MED ORDER — ONDANSETRON HCL 4 MG PO TABS
4.0000 mg | ORAL_TABLET | Freq: Once | ORAL | Status: AC
  Administered 2019-08-06: 4 mg via ORAL
  Filled 2019-08-06: qty 1

## 2019-08-06 MED ORDER — OXYCODONE-ACETAMINOPHEN 5-325 MG PO TABS: 1.0000 | ORAL_TABLET | Freq: Four times a day (QID) | ORAL | 0 refills | Status: DC | PRN

## 2019-08-06 MED ORDER — CEPHALEXIN 500 MG PO CAPS
500.0000 mg | ORAL_CAPSULE | Freq: Three times a day (TID) | ORAL | 0 refills | Status: DC
Start: 2019-08-06 — End: 2021-07-21

## 2019-08-06 MED FILL — CEPHALEXIN 500 MG CAPSULE: 500 | 7 days supply | Qty: 21 | Fill #0

## 2019-08-06 MED FILL — TAMSULOSIN HCL 0.4 MG CAP: 0.4 | 10 days supply | Qty: 10 | Fill #0

## 2019-08-06 MED FILL — ONDANSETRON HCL 4 MG TABS: 4 | 3 days supply | Qty: 10 | Fill #0

## 2019-08-06 MED FILL — OXYCODONE-APAP 5-325MG: 5-325 | 3 days supply | Qty: 15 | Fill #0

## 2019-08-06 NOTE — ED Provider Notes (Signed)
[facility] DEPT Provider Note   CSN: [id number] Arrival date & time: 08/05/19  2354   Time seen 6:15 AM  History Chief Complaint  Patient presents with  . Flank Pain    [name] is a 69 y.o. female.  HPI   Patient states she has a history of kidney stones, she states the last time she passed one was about 6 or 7 years ago.  She states she has had 5 prior episodes.  She is always been able to pass them on her own.  Last night about 8:30 PM she started having left flank pain that was constant but now is coming and going.  She has had nausea without vomiting.  She states her urine was dark yesterday but she was not sure if it was blood.  She denies any fever.  PCP Josetta Huddle, MD   Past Medical History:  Diagnosis Date  . A-fib ([place])   . Anemia    hx of  . Arthritis   . Chest pain   . Constipation   . Depression   . Diabetes mellitus [facility]  . Diabetic retinopathy ([place])   . Dyspnea    with minimal exertion-deconditioned  . Dyspnea   . Dysrhythmia    a-fib  . Food allergy   . GERD (gastroesophageal reflux disease)    occasional  . Gout   . History of kidney stones   . Hypercholesteremia   . Hyperlipidemia   . Hypertension   . Hypertensive kidney disease   . Hypothyroidism   . Knee pain, right   . 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 Obesity   . OSA on CPAP    uses CPAP nightly  . Osteoarthritis   . Palpitations   . Pinched nerve in neck   . Pleurisy   . Pneumonia    hx of  . PONV (postoperative nausea and vomiting)    for 3-4 days after general anesthesia approx 10-12 years ago  . Sciatica   . Swelling of knee joint, right   . Synovitis of knee RIGHT  . Trigger finger, left    left index  . Vitamin D deficiency     Patient Active Problem List   Diagnosis Date Noted  . Radial head fracture, closed 06/16/2018  . Closed fracture dislocation of elbow 06/16/2018  . Other fatigue 07/18/2017  .  Shortness of breath on exertion 07/18/2017  . Type 2 diabetes mellitus with retinopathy, with long-term current use of insulin ([place]) 07/18/2017  . Vitamin D deficiency 07/18/2017  . B12 nutritional deficiency 07/18/2017  . Other specified hypothyroidism 07/18/2017  . Acute pain of right knee 12/22/2016  . Trigger index finger of left hand 09/22/2016  . 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 Diabetes ([place]) 01/08/2014  . Essential hypertension 01/08/2014  . Hyperlipidemia 01/08/2014  . Postoperative anemia due to acute blood loss 11/22/2012  . Hyponatremia 11/06/2012  . OA (osteoarthritis) of knee 11/05/2012  . Villonodular synovitis of knee 04/20/2011    Past Surgical History:  Procedure Laterality Date  . CESAREAN SECTION  X3  . KNEE ARTHROSCOPY  04/20/2011   Procedure: ARTHROSCOPY KNEE;  Surgeon: Gearlean Alf, MD;  [facility];  Service: Orthopedics;  Laterality: Right;  WITH SYNOVECTOMY  . LEFT CARPAL TUNNEL / LEFT MIDDLE & RING  FINGER TRIGGER RELEASE  08-26-2008  . LEFT SHOULDER ARTHROSCOPY W/ DEBRIDEMENT  09-09-2003  . LEFT SHOULDER ARTHROSCOPY/ LEFT THUMB TRIGGER RELEASE  02-22-2005  . PHOTOCOAGULATION WITH LASER Right 03/20/2018   Procedure: PHOTOCOAGULATION WITH LASER;  Surgeon: Carmela RimaPatel, Narendra, MD;  [facility];  Service: Ophthalmology;  Laterality: Right;  . PULLEY RELEASE LEFT LONG FINGER  07-14-2009  . RADIAL HEAD ARTHROPLASTY Right 06/17/2018   Procedure: RADIAL HEAD ARTHROPLASTY;  Surgeon: Bjorn PippinVarkey, Dax T, MD;  Location: [facility];  Service: Orthopedics;  Laterality: Right;  . RIGHT CARPAL TUNNEL/ RIGHT THUMB TRIGGER RELEASE'S  11-28-2006  . RIGHT SHOULDER ARTHROSCOPY W/ ROTATOR CUFF REPAIR  01-13-2004  . SHOULDER ARTHROSCOPY DISTAL CLAVICLE EXCISION AND OPEN ROTATOR CUFF REPAIR  09-07-2004   LEFT   . SHOULDER ARTHROSCOPY W/ ACROMIAL REPAIR  11-29-2005   LEFT  . TONSILLECTOMY AND ADENOIDECTOMY  child  . TOTAL KNEE ARTHROPLASTY  12-14-2009   RIGHT  . TOTAL KNEE ARTHROPLASTY Left 11/05/2012   Procedure: LEFT TOTAL KNEE ARTHROPLASTY;  Surgeon: Loanne DrillingFrank V Aluisio, MD;  Location: [facility];  Service: Orthopedics;  Laterality: Left;  . TOTAL KNEE REVISION Right 07/29/2016   Procedure: RIGHT KNEE POLY-LINER EXCHANGE;  Surgeon: Kathryne HitchBlackman, Christopher Y, MD;  Location: [facility];  Service: Orthopedics;  Laterality: Right;  . TRIGGER FINGER RELEASE Right 02/21/2013   Procedure: RIGHT RING A-1 PULLEY RELEASE    (MINOR PROCEDURE) ;  Surgeon: Wyn Forsterobert V Sypher Jr., MD;  [facility];  Service: Orthopedics;  Laterality: Right;  . TRIGGER FINGER RELEASE Left 09/22/2016   Procedure: RELEASE TRIGGER FINGER LEFT INDEX FINGER;  Surgeon: Kathryne HitchBlackman, Christopher Y, MD;  Location: [facility];  Service: Orthopedics;  Laterality: Left;  . TRIGGER FINGER RELEASE Right 01/20/2017   Procedure: RELEASE TRIGGER FINGER/A-1 PULLEY RIGHT INDEX FINGER;  Surgeon: Marcene Corningalldorf, Peter, MD;  Location: [facility];  Service: Orthopedics;  Laterality: Right;  . VITRECTOMY 25 GAUGE WITH SCLERAL BUCKLE Right 03/20/2018   Procedure: RIGHT EYE VITRECTOMY WITH  ENDOLASER PARENTAL PHOTOCOAGULATION 25 GAUGE;  Surgeon: Carmela RimaPatel, Narendra, MD;  [facility];  Service: Ophthalmology;  Laterality: Right;  . WRIST ARTHROSCOPY WITH DEBRIDEMENT Right 05/01/2019   Procedure: WRIST ARTHROSCOPY WITH DEBRIDEMENT;  Surgeon: Dairl PonderWeingold, Matthew, MD;  Location: [facility];  Service: Orthopedics;  Laterality: Right;     OB History    Gravida  3   Para      Term      Preterm      AB      Living        SAB      TAB      Ectopic      Multiple      Live Births              Family History  Problem Relation Age of Onset  . Diabetes Mother   . Hypertension Mother   . Heart attack Mother   . Thyroid  disease Mother   . Obesity Mother   . Cancer Father   . Liver disease Father   . Sleep apnea Father   . Alcoholism Father     Social History   Tobacco Use  . Smoking status: Former Smoker    Years: 5.00    Types: Cigarettes    Quit date: 04/12/1977    Years since quitting: 42.3  . Smokeless tobacco: Never Used  Substance Use Topics  . Alcohol use: No  . Drug use: No  Home Medications Prior to Admission medications   Medication Sig Start Date End Date Taking? Authorizing Provider  Aflibercept (EYLEA) 2 MG/0.05ML SOLN 2 mg by Intravitreal route every 30 (thirty) days.    [provider]  amLODipine (NORVASC) 5 MG tablet Take 5 mg by mouth every evening.     [provider]  atorvastatin (LIPITOR) 40 MG tablet Take 40 mg by mouth every evening.     [provider]  calcium carbonate (CALCIUM 600) 600 MG TABS tablet Take 600 mg by mouth daily with breakfast.    [provider]  cephALEXin (KEFLEX) 500 MG capsule Take 1 capsule (500 mg total) by mouth 3 (three) times daily. 08/06/19   Devoria Albe, MD  cholecalciferol (VITAMIN D3) 25 MCG (1000 UT) tablet Take 1,000 Units by mouth daily.    [provider]  DULoxetine (CYMBALTA) 60 MG capsule Take 60 mg by mouth daily.    [provider]  flecainide (TAMBOCOR) 50 MG tablet TAKE 1 TABLET BY MOUTH TWO TIMES DAILY (PLEASE KEEP MARCH APPOINTMENT) 08/09/18   Wendall Stade, MD  hydrochlorothiazide (MICROZIDE) 12.5 MG capsule Take 12.5 mg by mouth daily.  04/05/17   [provider]  HYDROcodone-acetaminophen (NORCO/VICODIN) 5-325 MG tablet TAKE 1 TABLET BY MOUTH EVERY 6 HOURS AS NEEDED FOR MODERATE PAIN 01/28/19   Kathryne Hitch, MD  insulin glargine (LANTUS) 100 UNIT/ML injection Inject 48 Units into the skin 2 (two) times daily.     [provider]  Levothyroxine Sodium 137 MCG CAPS Take 137 mcg by mouth daily before breakfast.     [provider]   Melatonin 5 MG CAPS Take 5-10 mg by mouth at bedtime as needed (sleep).     [provider]  metFORMIN (GLUCOPHAGE-XR) 500 MG 24 hr tablet Take 1,000 mg by mouth daily with supper.    [provider]  methocarbamol (ROBAXIN) 500 MG tablet TAKE 1 TABLET BY MOUTH EVERY 6 HOURS AS NEEDED FOR MUSCLE SPASMS 07/11/19   Kathryne Hitch, MD  metoprolol tartrate (LOPRESSOR) 50 MG tablet Take 1 tablet (50 mg total) by mouth 2 (two) times daily. 06/12/18   Wendall Stade, MD  omeprazole (PRILOSEC) 20 MG capsule Take 20 mg by mouth daily.     [provider]  ondansetron (ZOFRAN) 4 MG tablet Take 1 tablet (4 mg total) by mouth every 8 (eight) hours as needed for nausea or vomiting. 08/06/19   Devoria Albe, MD  oxyCODONE-acetaminophen (PERCOCET/ROXICET) 5-325 MG tablet Take 1 tablet by mouth every 6 (six) hours as needed for severe pain. 08/06/19   Devoria Albe, MD  tamsulosin (FLOMAX) 0.4 MG CAPS capsule Take 1 po QD until you pass the stone. 08/06/19   Devoria Albe, MD  TRULICITY 3 MG/0.5ML SOPN Inject 3 mg as directed every Sunday.  05/11/17   [provider]  valsartan (DIOVAN) 320 MG tablet Take 320 mg by mouth daily.  04/25/17   [provider]  XARELTO 20 MG TABS tablet TAKE 1 TABLET BY MOUTH ONCE DAILY WITH SUPPER 06/18/19   Wendall Stade, MD    Allergies    Actos [pioglitazone hydrochloride], Avandia [rosiglitazone], Tetanus toxoids, and Food  Review of Systems   Review of Systems  All other systems reviewed and are negative.   Physical Exam Updated Vital Signs BP (!) 149/88   Pulse 66   Temp 98.1 F (36.7 C) (Oral)   Resp 18   Ht 5\' 1"  (1.549 m)  Wt 113.4 kg   SpO2 96%   BMI 47.24 kg/m   Physical Exam Vitals and nursing note reviewed.  Constitutional:      Appearance: Normal appearance. She is obese.  HENT:     Head: Normocephalic and atraumatic.     Right Ear: External ear normal.     Left Ear: External ear normal.  Eyes:      Extraocular Movements: Extraocular movements intact.     Conjunctiva/sclera: Conjunctivae normal.     Pupils: Pupils are equal, round, and reactive to light.  Cardiovascular:     Rate and Rhythm: Normal rate and regular rhythm.     Pulses: Normal pulses.  Pulmonary:     Effort: Pulmonary effort is normal. No respiratory distress.     Breath sounds: Normal breath sounds.  Abdominal:     General: Bowel sounds are normal.     Palpations: Abdomen is soft.     Tenderness: There is no abdominal tenderness. There is no right CVA tenderness or left CVA tenderness.  Musculoskeletal:        General: Normal range of motion.     Cervical back: Normal range of motion.  Skin:    General: Skin is warm and dry.  Neurological:     General: No focal deficit present.     Mental Status: She is alert and oriented to person, place, and time.     Cranial Nerves: No cranial nerve deficit.  Psychiatric:        Mood and Affect: Mood normal.        Thought Content: Thought content normal.     ED Results / Procedures / Treatments   Labs (all labs ordered are listed, but only abnormal results are displayed) Results for orders placed or performed during the hospital encounter of 08/06/19  Urinalysis, Routine w reflex microscopic- may I&O cath if menses  Result Value Ref Range   Color, Urine RED (A) YELLOW   APPearance HAZY (A) CLEAR   Specific Gravity, Urine 1.018 1.005 - 1.030   pH 6.0 5.0 - 8.0   Glucose, UA 150 (A) NEGATIVE mg/dL   Hgb urine dipstick LARGE (A) NEGATIVE   Bilirubin Urine NEGATIVE NEGATIVE   Ketones, ur NEGATIVE NEGATIVE mg/dL   Protein, ur 098 (A) NEGATIVE mg/dL   Nitrite NEGATIVE NEGATIVE   Leukocytes,Ua NEGATIVE NEGATIVE   RBC / HPF >50 (H) 0 - 5 RBC/hpf   WBC, UA >50 (H) 0 - 5 WBC/hpf   Bacteria, UA RARE (A) NONE SEEN   Squamous Epithelial / LPF 0-5 0 - 5  CBC  Result Value Ref Range   WBC 11.0 (H) 4.0 - 10.5 K/uL   RBC 4.57 3.87 - 5.11 MIL/uL   Hemoglobin 13.4 12.0 -  15.0 g/dL   HCT 11.9 14.7 - 82.9 %   MCV 93.4 80.0 - 100.0 fL   MCH 29.3 26.0 - 34.0 pg   MCHC 31.4 30.0 - 36.0 g/dL   RDW 56.2 13.0 - 86.5 %   Platelets 323 150 - 400 K/uL   nRBC 0.0 0.0 - 0.2 %  Basic metabolic panel  Result Value Ref Range   Sodium 138 135 - 145 mmol/L   Potassium 4.3 3.5 - 5.1 mmol/L   Chloride 105 98 - 111 mmol/L   CO2 23 22 - 32 mmol/L   Glucose, Bld 226 (H) 70 - 99 mg/dL   BUN 36 (H) 8 - 23 mg/dL   Creatinine, Ser 7.84 (H) 0.44 - 1.00  mg/dL   Calcium 9.4 8.9 - 67.6 mg/dL   GFR calc non Af Amer 37 (L) >60 mL/min   GFR calc Af Amer 43 (L) >60 mL/min   Anion gap 10 5 - 15   Laboratory interpretation all normal except renal insufficiency, hyperglycemia, mild leukocytosis, possible UTI    EKG None  Radiology CT Renal Stone Study  Result Date: 08/06/2019 CLINICAL DATA:  Left flank pain for several days, reduced urination EXAM: CT ABDOMEN AND PELVIS WITHOUT CONTRAST TECHNIQUE: Multidetector CT imaging of the abdomen and pelvis was performed following the standard protocol without IV contrast. COMPARISON:  CT 02/23/2011 FINDINGS: Lower chest: Dependent atelectatic changes in the lung bases with more bandlike opacities in the left lower lobe likely reflecting subsegmental atelectasis or scarring. Mild cardiomegaly. Calcifications of the coronary arteries. No pericardial effusion. Hepatobiliary: No focal liver abnormality is seen. No gallstones, gallbladder wall thickening, or biliary dilatation. Pancreas: Moderate pancreatic atrophy. No pancreatic ductal dilatation or peripancreatic inflammation. Spleen: Normal in size without focal abnormality. Adrenals/Urinary Tract: Normal adrenal glands. Asymmetric mild left hydroureteronephrosis to the level of a 3 mm calculus in the distal left ureter just proximal to the ureterovesicular junction (2/76). Minimal left perinephric and periureteral stranding. Additional bilateral punctate nonobstructing calculi in both kidneys. No  right urinary tract dilatation. Suspect few parapelvic cysts in the bilateral renal pelves. No concerning visible or contour deforming renal lesion. Urinary bladder is largely decompressed at the time of exam and therefore poorly evaluated by CT imaging. Mild wall thickening likely related to underdistention. Some stranding along the anterior bladder is similar to prior and is likely postsurgical in nature related to prior Caesarean section. Stomach/Bowel: Distal esophagus, stomach and duodenal sweep are unremarkable. No small bowel wall thickening or dilatation. No evidence of obstruction. Cecum is slightly mobile displaced towards the midline abdomen. A normal appendix is visualized. No colonic dilatation or wall thickening. There is marked redundancy of the sigmoid colon which is displaced into the right upper quadrant interposed anterior to the hepatic flexure and transverse colon. No evidence of volvulus or colonic obstruction. Vascular/Lymphatic: Atherosclerotic calcifications throughout the abdominal aorta and branch vessels. No aneurysm or ectasia. No enlarged abdominopelvic lymph nodes. 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 Some focal postsurgical change seen along the anterior uterine fundus likely related to prior Caesarean section with tethering to the anterior abdominal wall. No concerning adnexal lesions. Other: No abdominopelvic free fluid or free gas. No bowel containing hernias. Small fat containing umbilical hernia. Mild skin thickening, fat stranding and punctate foci of gas in the superficial subcutaneous tissues of the low anterior abdominal wall likely related to injectable use. Musculoskeletal: No acute osseous abnormality or suspicious osseous lesion. Multilevel degenerative changes are present in the imaged portions of the spine slightly progressive from comparison Imaging in 2012. Mild grade 1 anterolisthesis L4 on 5 without  spondylolysis. Additional degenerative changes in the SI joints and bilateral hips. IMPRESSION: 1. Mild left hydroureteronephrosis to the level of a 3 mm calculus in the distal left ureter just proximal to the ureterovesicular junction. 2. Additional bilateral punctate nonobstructing nephrolithiasis. 3. Marked redundancy of the sigmoid colon which is displaced into the right upper quadrant interposed anterior to the hepatic flexure and transverse colon. No evidence of volvulus or colonic obstruction. 4. Likely fibroid uterus with postsurgical changes from prior Caesarean section. If clinically warranted, outpatient pelvic ultrasound could be obtained for further evaluation. 5. Aortic Atherosclerosis (ICD10-I70.0). Electronically Signed   By:  Kreg Shropshire [name]   On: 08/06/2019 06:49    Procedures Procedures (including critical care time)  Medications Ordered in ED Medications  oxyCODONE-acetaminophen (PERCOCET/ROXICET) 5-325 MG per tablet 1 tablet (has no administration in time range)  ondansetron (ZOFRAN) tablet 4 mg (has no administration in time range)  cephALEXin (KEFLEX) capsule 500 mg (500 mg Oral Given 08/06/19 1914)    ED Course  I have reviewed the triage vital signs and the nursing notes.  Pertinent labs & imaging results that were available during my care of the patient were reviewed by me and considered in my medical decision making (see chart for details).    MDM Rules/Calculators/A&P                      At the time of my exam patient states she does not need any pain medication at this time.  She was advised to let the nurse know if she gets worsening pain or changes her mind.  CT renal was done to evaluate her current symptoms.  Recheck we discussed her CT result.  Patient states she has mild pain now.  She feels like she would be okay with an oral pain pill and nausea medicine.  When I was doing her discharge papers it would not let me print her discharge papers because she  has a legal guardian.  When I talked to the patient she states she has a POA but that is only if she is incapacitated.   Final Clinical Impression(s) / ED Diagnoses Final diagnoses:  Left flank pain  Urinary tract infection with hematuria, site unspecified  Left ureteral stone    Rx / DC Orders ED Discharge Orders         Ordered    oxyCODONE-acetaminophen (PERCOCET/ROXICET) 5-325 MG tablet  Every 6 hours PRN     08/06/19 0702    ondansetron (ZOFRAN) 4 MG tablet  Every 8 hours PRN     08/06/19 0702    tamsulosin (FLOMAX) 0.4 MG CAPS capsule     08/06/19 0702    cephALEXin (KEFLEX) 500 MG capsule  3 times daily     08/06/19 7829         Plan discharge   Devoria Albe, MD, Concha Pyo, MD 08/06/19 [phone]

## 2019-08-06 NOTE — ED Notes (Signed)
Pt called in ED lobby for labs, no answer x1. [employer]

## 2019-08-06 NOTE — Discharge Instructions (Addendum)
Drink plenty of fluids. Take the medications as prescribed. Return to the ED if you get fever or have uncontrolled vomiting or pain.  Please call [facility] to get an appointment to be seen.  You have a "3 mm stone in the distal left ureter just proximal to the UVJ"

## 2019-08-06 NOTE — ED Triage Notes (Signed)
Left flank pain for several days. Hx stones. Sts not urinating as much as normal.

## 2019-08-12 MED FILL — hydrALAZINE HCL 25 MG TABS: 25 | 90 days supply | Qty: 180 | Fill #2

## 2019-08-12 MED FILL — DUREZOL 0.05% EYE DROPS: 0.05 | 25 days supply | Qty: 5 | Fill #1

## 2019-08-12 MED FILL — OMEPRAZOLE 20 MG CAP: 20 | 90 days supply | Qty: 90 | Fill #3

## 2019-08-13 ENCOUNTER — Encounter: Payer: 59 | Admitting: Occupational Therapy

## 2019-08-13 ENCOUNTER — Other Ambulatory Visit: Payer: Self-pay | Admitting: Cardiovascular Disease

## 2019-08-13 MED FILL — AMOXICILLIN 500 MG CAPSULE: 500 | 5 days supply | Qty: 20 | Fill #0

## 2019-08-13 MED FILL — LEVOTHYROXINE 137 MCG TABLE: 137 | 90 days supply | Qty: 90 | Fill #0

## 2019-08-13 MED FILL — BASAGLAR 100 UNIT/ML KWIKPE: 100 | 30 days supply | Qty: 30 | Fill #0

## 2019-08-13 MED FILL — UNIFINE PENTIPS 32GX5/32: 32G X 4 MM | 90 days supply | Qty: 200 | Fill #0

## 2019-08-13 MED FILL — HYDROCHLOROTHIAZIDE 12.5 MG: 12.5 | 90 days supply | Qty: 90 | Fill #2

## 2019-08-14 ENCOUNTER — Other Ambulatory Visit: Payer: Self-pay | Admitting: Cardiovascular Disease

## 2019-08-14 MED FILL — METOPROLOL TARTRATE 50 MG T: 50 | 90 days supply | Qty: 180 | Fill #0

## 2019-08-15 DIAGNOSIS — Z87442 Personal history of urinary calculi: Secondary | ICD-10-CM | POA: Diagnosis not present

## 2019-08-15 DIAGNOSIS — E039 Hypothyroidism, unspecified: Secondary | ICD-10-CM | POA: Diagnosis not present

## 2019-08-15 DIAGNOSIS — E113299 Type 2 diabetes mellitus with mild nonproliferative diabetic retinopathy without macular edema, unspecified eye: Secondary | ICD-10-CM | POA: Diagnosis not present

## 2019-08-15 DIAGNOSIS — E11319 Type 2 diabetes mellitus with unspecified diabetic retinopathy without macular edema: Secondary | ICD-10-CM | POA: Diagnosis not present

## 2019-08-15 DIAGNOSIS — Z794 Long term (current) use of insulin: Secondary | ICD-10-CM | POA: Diagnosis not present

## 2019-08-20 ENCOUNTER — Other Ambulatory Visit (HOSPITAL_COMMUNITY): Payer: Self-pay | Admitting: Internal Medicine

## 2019-08-29 DIAGNOSIS — N202 Calculus of kidney with calculus of ureter: Secondary | ICD-10-CM | POA: Diagnosis not present

## 2019-09-04 DIAGNOSIS — E113511 Type 2 diabetes mellitus with proliferative diabetic retinopathy with macular edema, right eye: Secondary | ICD-10-CM | POA: Diagnosis not present

## 2019-09-05 ENCOUNTER — Other Ambulatory Visit (HOSPITAL_COMMUNITY): Payer: Self-pay | Admitting: Internal Medicine

## 2019-09-05 DIAGNOSIS — Z794 Long term (current) use of insulin: Secondary | ICD-10-CM | POA: Diagnosis not present

## 2019-09-05 DIAGNOSIS — E1165 Type 2 diabetes mellitus with hyperglycemia: Secondary | ICD-10-CM | POA: Diagnosis not present

## 2019-09-05 MED FILL — LEVOTHYROXINE 150 MCG TAB: 150 | 30 days supply | Qty: 30 | Fill #0

## 2019-09-06 MED FILL — FARXIGA 10 MG TABLET: 10 | 90 days supply | Qty: 90 | Fill #0

## 2019-09-09 ENCOUNTER — Other Ambulatory Visit: Payer: Self-pay | Admitting: Cardiovascular Disease

## 2019-09-09 MED FILL — LOSARTAN POTASSIUM 100 MG T: 100 | 90 days supply | Qty: 90 | Fill #2

## 2019-09-10 ENCOUNTER — Other Ambulatory Visit: Payer: Self-pay | Admitting: Cardiovascular Disease

## 2019-09-10 DIAGNOSIS — H25812 Combined forms of age-related cataract, left eye: Secondary | ICD-10-CM | POA: Diagnosis not present

## 2019-09-10 DIAGNOSIS — H2512 Age-related nuclear cataract, left eye: Secondary | ICD-10-CM | POA: Diagnosis not present

## 2019-09-10 MED ORDER — FLECAINIDE ACETATE 50 MG PO TABS: ORAL_TABLET | ORAL | 3 refills | Status: DC

## 2019-09-10 MED FILL — FLECAINIDE ACETATE 50 MG TA: 50 | 90 days supply | Qty: 180 | Fill #0

## 2019-09-16 MED FILL — BASAGLAR 100 UNIT/ML KWIKPE: 100 | 30 days supply | Qty: 30 | Fill #1

## 2019-09-16 MED FILL — INVOKANA 100 MG TABLET: 100 | 90 days supply | Qty: 90 | Fill #0

## 2019-09-17 ENCOUNTER — Other Ambulatory Visit: Payer: Self-pay | Admitting: Cardiovascular Disease

## 2019-09-17 MED FILL — TRULICITY 3 MG/0.5ML SOPN: 3 | 28 days supply | Qty: 2 | Fill #2

## 2019-09-17 MED FILL — XARELTO 20 MG TABLET: 20 | 90 days supply | Qty: 90 | Fill #0

## 2019-09-17 NOTE — Telephone Encounter (Signed)
Prescription refill request for Xarelto received.   Last office visit: Nishan,  08/02/2019 Weight: 113.4 kg  Age: 69 y.o. Scr: 1.43, 08/06/2019 CrCl: 65 ml/min   Prescription refill sent.

## 2019-09-25 MED FILL — DULOXETINE HCL 60 MG CPEP: 60 | 30 days supply | Qty: 30 | Fill #2

## 2019-09-26 ENCOUNTER — Other Ambulatory Visit (HOSPITAL_COMMUNITY): Payer: Self-pay | Admitting: Internal Medicine

## 2019-09-26 MED FILL — AMLODIPINE BESYLATE 5 MG TA: 5 | 90 days supply | Qty: 90 | Fill #0

## 2019-09-30 DIAGNOSIS — E113511 Type 2 diabetes mellitus with proliferative diabetic retinopathy with macular edema, right eye: Secondary | ICD-10-CM | POA: Diagnosis not present

## 2019-09-30 DIAGNOSIS — H3581 Retinal edema: Secondary | ICD-10-CM | POA: Diagnosis not present

## 2019-09-30 DIAGNOSIS — E113312 Type 2 diabetes mellitus with moderate nonproliferative diabetic retinopathy with macular edema, left eye: Secondary | ICD-10-CM | POA: Diagnosis not present

## 2019-09-30 DIAGNOSIS — H3582 Retinal ischemia: Secondary | ICD-10-CM | POA: Diagnosis not present

## 2019-09-30 DIAGNOSIS — Z961 Presence of intraocular lens: Secondary | ICD-10-CM | POA: Diagnosis not present

## 2019-09-30 DIAGNOSIS — H34831 Tributary (branch) retinal vein occlusion, right eye, with macular edema: Secondary | ICD-10-CM | POA: Diagnosis not present

## 2019-10-09 DIAGNOSIS — E113511 Type 2 diabetes mellitus with proliferative diabetic retinopathy with macular edema, right eye: Secondary | ICD-10-CM | POA: Diagnosis not present

## 2019-10-11 ENCOUNTER — Other Ambulatory Visit (HOSPITAL_COMMUNITY): Payer: Self-pay | Admitting: Internal Medicine

## 2019-10-11 MED FILL — LEVOTHYROXINE 150 MCG TAB: 150 | 30 days supply | Qty: 30 | Fill #1

## 2019-10-11 MED FILL — INDOMETHACIN 50 MG CAPSULE: 50 | 6 days supply | Qty: 20 | Fill #0

## 2019-10-11 MED FILL — ATORVASTATIN CALCIUM 40 MG: 40 | 90 days supply | Qty: 90 | Fill #0

## 2019-10-16 DIAGNOSIS — E039 Hypothyroidism, unspecified: Secondary | ICD-10-CM | POA: Diagnosis not present

## 2019-10-16 MED FILL — BASAGLAR 100 UNIT/ML KWIKPE: 100 | 30 days supply | Qty: 30 | Fill #2

## 2019-10-23 MED FILL — DULOXETINE HCL 60 MG CPEP: 60 | 30 days supply | Qty: 30 | Fill #3

## 2019-10-24 MED FILL — TRULICITY 3 MG/0.5ML SOPN: 3 | 28 days supply | Qty: 2 | Fill #3

## 2019-11-01 MED FILL — INDOMETHACIN 50 MG CAPSULE: 50 | 6 days supply | Qty: 20 | Fill #1

## 2019-11-11 DIAGNOSIS — M48061 Spinal stenosis, lumbar region without neurogenic claudication: Secondary | ICD-10-CM | POA: Diagnosis not present

## 2019-11-11 DIAGNOSIS — Z6841 Body Mass Index (BMI) 40.0 and over, adult: Secondary | ICD-10-CM | POA: Diagnosis not present

## 2019-11-14 MED FILL — BASAGLAR 100 UNIT/ML KWIKPE: 100 | 30 days supply | Qty: 30 | Fill #3

## 2019-11-15 DIAGNOSIS — E039 Hypothyroidism, unspecified: Secondary | ICD-10-CM | POA: Diagnosis not present

## 2019-11-15 DIAGNOSIS — E1165 Type 2 diabetes mellitus with hyperglycemia: Secondary | ICD-10-CM | POA: Diagnosis not present

## 2019-11-15 DIAGNOSIS — Z794 Long term (current) use of insulin: Secondary | ICD-10-CM | POA: Diagnosis not present

## 2019-11-15 DIAGNOSIS — Z87442 Personal history of urinary calculi: Secondary | ICD-10-CM | POA: Diagnosis not present

## 2019-11-15 MED FILL — METOPROLOL TARTRATE 50 MG T: 50 | 90 days supply | Qty: 180 | Fill #1

## 2019-11-15 MED FILL — LEVOTHYROXINE 150 MCG TAB: 150 | 30 days supply | Qty: 30 | Fill #2

## 2019-11-15 MED FILL — HYDRALAZINE HCL 25 MG TABS: 25 | 90 days supply | Qty: 180 | Fill #3

## 2019-11-15 MED FILL — HYDROCHLOROTHIAZIDE 12.5 MG: 12.5 | 90 days supply | Qty: 90 | Fill #3

## 2019-11-15 MED FILL — metFORMIN HCL ER 500 MG TB2: 500 | 90 days supply | Qty: 180 | Fill #1

## 2019-11-20 DIAGNOSIS — E113511 Type 2 diabetes mellitus with proliferative diabetic retinopathy with macular edema, right eye: Secondary | ICD-10-CM | POA: Diagnosis not present

## 2019-11-20 MED FILL — TRULICITY 3 MG/0.5ML SOPN: 3 | 28 days supply | Qty: 2 | Fill #4

## 2019-11-22 ENCOUNTER — Other Ambulatory Visit (HOSPITAL_COMMUNITY): Payer: Self-pay | Admitting: Internal Medicine

## 2019-11-22 MED FILL — OMEPRAZOLE 20 MG CAP: 20 | 90 days supply | Qty: 90 | Fill #0

## 2019-11-22 MED FILL — DULOXETINE HCL 60 MG CPEP: 60 | 30 days supply | Qty: 30 | Fill #4

## 2019-12-03 DIAGNOSIS — G4733 Obstructive sleep apnea (adult) (pediatric): Secondary | ICD-10-CM | POA: Diagnosis not present

## 2019-12-05 DIAGNOSIS — M48061 Spinal stenosis, lumbar region without neurogenic claudication: Secondary | ICD-10-CM | POA: Diagnosis not present

## 2019-12-11 ENCOUNTER — Other Ambulatory Visit (HOSPITAL_COMMUNITY): Payer: Self-pay | Admitting: Internal Medicine

## 2019-12-11 MED FILL — TRULICITY 4.5 MG/0.5ML SOPN: 4.5 | 84 days supply | Qty: 6 | Fill #0

## 2019-12-12 MED FILL — FARXIGA 10 MG TABLET: 10 | 90 days supply | Qty: 90 | Fill #1

## 2019-12-12 MED FILL — FLECAINIDE ACETATE 50 MG TA: 50 | 90 days supply | Qty: 180 | Fill #1

## 2019-12-12 MED FILL — LOSARTAN POTASSIUM 100 MG T: 100 | 90 days supply | Qty: 90 | Fill #3

## 2019-12-12 MED FILL — LEVOTHYROXINE 150 MCG TAB: 150 | 30 days supply | Qty: 30 | Fill #3

## 2019-12-13 MED FILL — XARELTO 20 MG TABLET: 20 | 90 days supply | Qty: 90 | Fill #1

## 2019-12-25 DIAGNOSIS — E113511 Type 2 diabetes mellitus with proliferative diabetic retinopathy with macular edema, right eye: Secondary | ICD-10-CM | POA: Diagnosis not present

## 2019-12-26 MED FILL — BASAGLAR 100 UNIT/ML KWIKPE: 100 | 30 days supply | Qty: 30 | Fill #4

## 2020-01-07 DIAGNOSIS — G4733 Obstructive sleep apnea (adult) (pediatric): Secondary | ICD-10-CM | POA: Diagnosis not present

## 2020-01-22 ENCOUNTER — Telehealth: Payer: Self-pay | Admitting: *Deleted

## 2020-01-22 NOTE — Telephone Encounter (Signed)
° °  [facility] Pre-operative Risk Assessment    HEARTCARE STAFF: - Please ensure there is not already an duplicate clearance open for this procedure. - Under Visit Info/Reason for Call, type in Other and utilize the format Clearance MM/DD/YY or Clearance TBD. Do not use dashes or single digits. - If request is for dental extraction, please clarify the # of teeth to be extracted.  Request for surgical clearance:  1. What type of surgery is being performed? Extraction bony impacted tooth, extraction molar   2. When is this surgery scheduled? TBD   3. What type of clearance is required (medical clearance vs. Pharmacy clearance to hold med vs. Both)? Both  4. Are there any medications that need to be held prior to surgery and how long?Xarelto and Farxiga   5. Practice name and name of physician performing surgery? Diona Browner, DMD, PA; [facility]  6. What is the office phone number? [phone]   7.   What is the office fax number? [phone]  8.   Anesthesia type (None, local, MAC, general) ? Local/Nitrous   Bevelyn Buckles L 01/22/2020, 1:50 PM  _________________________________________________________________   (provider comments below)

## 2020-01-22 NOTE — Telephone Encounter (Signed)
   Primary Cardiologist: Charlton Haws, MD  Chart reviewed as part of pre-operative protocol coverage.   Simple dental extractions (1-2 teeth) are considered low risk procedures per guidelines and generally do not require any specific cardiac clearance. It is also generally accepted that for simple extractions and dental cleanings, there is no need to interrupt blood thinner therapy.  SBE prophylaxis is not required for the patient from a cardiac standpoint.  I will route this recommendation to the requesting party via Epic fax function and remove from pre-op pool.  Please call with questions.  Beatriz Stallion, PA-C 01/22/2020, 3:32 PM

## 2020-01-27 MED FILL — LEVOTHYROXINE 150 MCG TAB: 150 | 30 days supply | Qty: 30 | Fill #4

## 2020-01-27 MED FILL — DULOXETINE HCL 60 MG CPEP: 60 | 30 days supply | Qty: 30 | Fill #6

## 2020-01-28 ENCOUNTER — Other Ambulatory Visit (HOSPITAL_COMMUNITY): Payer: Self-pay | Admitting: Orthopedic Surgery

## 2020-01-28 MED FILL — metFORMIN HCL ER 500 MG TB2: 500 | 90 days supply | Qty: 180 | Fill #2

## 2020-01-28 MED FILL — AMOXICILLIN 500 MG CAPSULE: 500 | 5 days supply | Qty: 20 | Fill #0

## 2020-01-28 MED FILL — ATORVASTATIN 40 MG TABLET: 40 | 90 days supply | Qty: 90 | Fill #1

## 2020-01-30 MED FILL — BASAGLAR 100 UNIT/ML KWIKPE: 100 | 30 days supply | Qty: 30 | Fill #5

## 2020-02-10 DIAGNOSIS — E113511 Type 2 diabetes mellitus with proliferative diabetic retinopathy with macular edema, right eye: Secondary | ICD-10-CM | POA: Diagnosis not present

## 2020-02-13 MED FILL — METHOCARBAMOL 500 MG TABLET: 500 | 15 days supply | Qty: 60 | Fill #1

## 2020-02-13 MED FILL — AMOXICILLIN 500 MG CAPSULE: 500 | 5 days supply | Qty: 20 | Fill #1

## 2020-02-18 ENCOUNTER — Ambulatory Visit
Admission: RE | Admit: 2020-02-18 | Discharge: 2020-02-18 | Disposition: A | Discharge status: Home / Self Care | Payer: 59 | Source: Ambulatory Visit | Attending: Internal Medicine | Admitting: Internal Medicine

## 2020-02-18 ENCOUNTER — Other Ambulatory Visit: Payer: Self-pay | Admitting: Internal Medicine

## 2020-02-18 ENCOUNTER — Other Ambulatory Visit (HOSPITAL_COMMUNITY): Payer: Self-pay | Admitting: Internal Medicine

## 2020-02-18 DIAGNOSIS — Z794 Long term (current) use of insulin: Secondary | ICD-10-CM | POA: Diagnosis not present

## 2020-02-18 DIAGNOSIS — E1165 Type 2 diabetes mellitus with hyperglycemia: Secondary | ICD-10-CM | POA: Diagnosis not present

## 2020-02-18 DIAGNOSIS — M25532 Pain in left wrist: Secondary | ICD-10-CM

## 2020-02-18 DIAGNOSIS — Z87442 Personal history of urinary calculi: Secondary | ICD-10-CM | POA: Diagnosis not present

## 2020-02-18 DIAGNOSIS — E039 Hypothyroidism, unspecified: Secondary | ICD-10-CM | POA: Diagnosis not present

## 2020-02-18 MED FILL — XIGDUO XR 10 MG-1,000 MG TA: 10-1000 | 90 days supply | Qty: 90 | Fill #0

## 2020-02-23 MED FILL — METOPROLOL TARTRATE 50 MG T: 50 | 90 days supply | Qty: 180 | Fill #2

## 2020-02-23 MED FILL — LEVOTHYROXINE 150 MCG TAB: 150 | 30 days supply | Qty: 30 | Fill #5

## 2020-02-23 MED FILL — DULOXETINE HCL 60 MG CPEP: 60 | 30 days supply | Qty: 30 | Fill #7

## 2020-02-24 ENCOUNTER — Other Ambulatory Visit (HOSPITAL_COMMUNITY): Payer: Self-pay | Admitting: Internal Medicine

## 2020-02-24 MED FILL — HYDRALAZINE HCL 25 MG TABS: 25 | 90 days supply | Qty: 180 | Fill #0

## 2020-02-24 MED FILL — HYDROCHLOROTHIAZIDE 12.5 MG: 12.5 | 90 days supply | Qty: 90 | Fill #0

## 2020-03-04 MED FILL — BASAGLAR 100 UNIT/ML KWIKPE: 100 | 30 days supply | Qty: 30 | Fill #6

## 2020-03-19 ENCOUNTER — Other Ambulatory Visit: Payer: Self-pay | Admitting: Cardiovascular Disease

## 2020-03-19 MED FILL — XARELTO 20 MG TABLET: 20 | 90 days supply | Qty: 90 | Fill #0

## 2020-03-19 MED FILL — TRULICITY 4.5 MG/0.5ML SOPN: 4.5 | 84 days supply | Qty: 6 | Fill #1

## 2020-03-19 MED FILL — OMEPRAZOLE 20 MG CAP: 20 | 90 days supply | Qty: 90 | Fill #1

## 2020-03-19 MED FILL — FLECAINIDE ACETATE 50 MG TA: 50 | 90 days supply | Qty: 180 | Fill #2

## 2020-03-19 NOTE — Telephone Encounter (Signed)
Pt last saw Dr Eden Emms 08/02/19, Last labs 08/06/19 Creat 1.43, age 70, weight 113.4kg, CrCl 64.6, based on CrCl pt is on appropriate dosage of Xarelto 20mg  QD.  Will refill rx.

## 2020-03-23 DIAGNOSIS — Z0000 Encounter for general adult medical examination without abnormal findings: Secondary | ICD-10-CM | POA: Diagnosis not present

## 2020-03-23 DIAGNOSIS — Z87442 Personal history of urinary calculi: Secondary | ICD-10-CM | POA: Diagnosis not present

## 2020-03-23 DIAGNOSIS — E039 Hypothyroidism, unspecified: Secondary | ICD-10-CM | POA: Diagnosis not present

## 2020-03-23 DIAGNOSIS — E113213 Type 2 diabetes mellitus with mild nonproliferative diabetic retinopathy with macular edema, bilateral: Secondary | ICD-10-CM | POA: Diagnosis not present

## 2020-03-23 DIAGNOSIS — F322 Major depressive disorder, single episode, severe without psychotic features: Secondary | ICD-10-CM | POA: Diagnosis not present

## 2020-03-23 DIAGNOSIS — I4891 Unspecified atrial fibrillation: Secondary | ICD-10-CM | POA: Diagnosis not present

## 2020-03-23 DIAGNOSIS — E559 Vitamin D deficiency, unspecified: Secondary | ICD-10-CM | POA: Diagnosis not present

## 2020-03-23 DIAGNOSIS — Z79899 Other long term (current) drug therapy: Secondary | ICD-10-CM | POA: Diagnosis not present

## 2020-03-23 DIAGNOSIS — Z794 Long term (current) use of insulin: Secondary | ICD-10-CM | POA: Diagnosis not present

## 2020-03-23 DIAGNOSIS — E7800 Pure hypercholesterolemia, unspecified: Secondary | ICD-10-CM | POA: Diagnosis not present

## 2020-03-23 DIAGNOSIS — E1165 Type 2 diabetes mellitus with hyperglycemia: Secondary | ICD-10-CM | POA: Diagnosis not present

## 2020-03-25 DIAGNOSIS — E113511 Type 2 diabetes mellitus with proliferative diabetic retinopathy with macular edema, right eye: Secondary | ICD-10-CM | POA: Diagnosis not present

## 2020-04-01 MED FILL — DULOXETINE HCL 60 MG CPEP: 60 | 30 days supply | Qty: 30 | Fill #8

## 2020-04-01 MED FILL — LEVOTHYROXINE 150 MCG TAB: 150 | 30 days supply | Qty: 30 | Fill #6

## 2020-04-01 MED FILL — AMLODIPINE BESYLATE 5 MG TA: 5 | 90 days supply | Qty: 90 | Fill #2

## 2020-04-02 ENCOUNTER — Other Ambulatory Visit (HOSPITAL_COMMUNITY): Payer: Self-pay | Admitting: Internal Medicine

## 2020-04-02 MED FILL — LOSARTAN POTASSIUM 100 MG T: 100 | 90 days supply | Qty: 90 | Fill #0

## 2020-04-06 ENCOUNTER — Other Ambulatory Visit (HOSPITAL_COMMUNITY): Payer: Self-pay | Admitting: Internal Medicine

## 2020-04-06 DIAGNOSIS — E113312 Type 2 diabetes mellitus with moderate nonproliferative diabetic retinopathy with macular edema, left eye: Secondary | ICD-10-CM | POA: Diagnosis not present

## 2020-04-06 DIAGNOSIS — E113511 Type 2 diabetes mellitus with proliferative diabetic retinopathy with macular edema, right eye: Secondary | ICD-10-CM | POA: Diagnosis not present

## 2020-04-06 DIAGNOSIS — H3582 Retinal ischemia: Secondary | ICD-10-CM | POA: Diagnosis not present

## 2020-04-06 DIAGNOSIS — H34831 Tributary (branch) retinal vein occlusion, right eye, with macular edema: Secondary | ICD-10-CM | POA: Diagnosis not present

## 2020-04-06 DIAGNOSIS — H43811 Vitreous degeneration, right eye: Secondary | ICD-10-CM | POA: Diagnosis not present

## 2020-04-09 ENCOUNTER — Other Ambulatory Visit (HOSPITAL_COMMUNITY): Payer: Self-pay | Admitting: Oral Surgery

## 2020-04-09 MED FILL — AMOXICILLIN 500 MG CAPSULE: 500 | 7 days supply | Qty: 21 | Fill #0

## 2020-04-09 MED FILL — HYDROCODON-APAP 5-325: 5-325 | 2 days supply | Qty: 12 | Fill #0

## 2020-04-09 MED FILL — BASAGLAR 100 UNIT/ML KWIKPE: 100 | 30 days supply | Qty: 30 | Fill #0

## 2020-04-20 DIAGNOSIS — E113511 Type 2 diabetes mellitus with proliferative diabetic retinopathy with macular edema, right eye: Secondary | ICD-10-CM | POA: Diagnosis not present

## 2020-04-21 ENCOUNTER — Other Ambulatory Visit (HOSPITAL_COMMUNITY): Payer: Self-pay | Admitting: Oral Surgery

## 2020-04-21 MED FILL — HYDROCODON-APAP 5-325: 5-325 | 2 days supply | Qty: 12 | Fill #0

## 2020-04-29 ENCOUNTER — Other Ambulatory Visit (HOSPITAL_COMMUNITY): Payer: Self-pay | Admitting: Internal Medicine

## 2020-04-30 MED FILL — LEVOTHYROXINE 150 MCG TAB: 150 | 30 days supply | Qty: 30 | Fill #7

## 2020-04-30 MED FILL — DULOXETINE HCL 60 MG CPEP: 60 | 30 days supply | Qty: 30 | Fill #9

## 2020-04-30 MED FILL — ATORVASTATIN 40 MG TABLET: 40 | 90 days supply | Qty: 90 | Fill #2

## 2020-05-06 DIAGNOSIS — E113511 Type 2 diabetes mellitus with proliferative diabetic retinopathy with macular edema, right eye: Secondary | ICD-10-CM | POA: Diagnosis not present

## 2020-05-18 MED FILL — BASAGLAR 100 UNIT/ML KWIKPE: 100 | 30 days supply | Qty: 30 | Fill #1

## 2020-05-18 MED FILL — INDOMETHACIN 50 MG CAPSULE: 50 | 6 days supply | Qty: 20 | Fill #2

## 2020-05-26 ENCOUNTER — Other Ambulatory Visit (HOSPITAL_COMMUNITY): Payer: Self-pay | Admitting: Internal Medicine

## 2020-05-26 DIAGNOSIS — Z87442 Personal history of urinary calculi: Secondary | ICD-10-CM | POA: Diagnosis not present

## 2020-05-26 DIAGNOSIS — Z794 Long term (current) use of insulin: Secondary | ICD-10-CM | POA: Diagnosis not present

## 2020-05-26 DIAGNOSIS — E039 Hypothyroidism, unspecified: Secondary | ICD-10-CM | POA: Diagnosis not present

## 2020-05-26 DIAGNOSIS — Z9181 History of falling: Secondary | ICD-10-CM | POA: Diagnosis not present

## 2020-05-26 DIAGNOSIS — E1165 Type 2 diabetes mellitus with hyperglycemia: Secondary | ICD-10-CM | POA: Diagnosis not present

## 2020-05-28 ENCOUNTER — Other Ambulatory Visit (HOSPITAL_COMMUNITY): Payer: Self-pay | Admitting: Internal Medicine

## 2020-06-07 IMAGING — CT CT RENAL STONE PROTOCOL
2 of 4 series · 15 of 46 positions shown, 17 images · non-contrast
Comparison: CT 02/23/2011

CLINICAL DATA: Left flank pain for several days, reduced urination

EXAM:
CT ABDOMEN AND PELVIS WITHOUT CONTRAST
TECHNIQUE: Multidetector CT imaging of the abdomen and pelvis was performed
following the standard protocol without IV contrast.

[Series 2: axial st · axial · 0.90mm/px · z∈[-549,-144]mm · 12 of 93 slices shown, 14 images]
[im 6/93  soft-tissue]
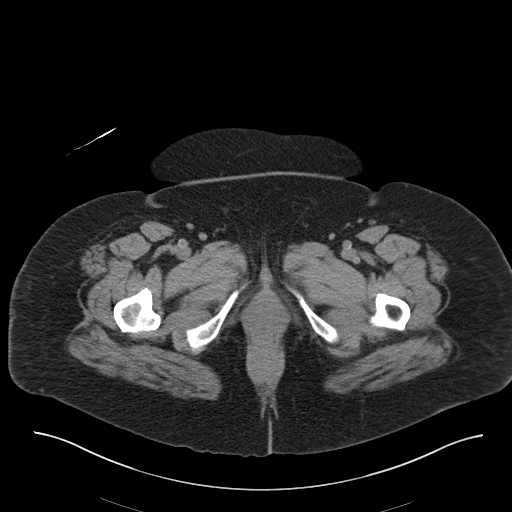
[im 6/93  bone]
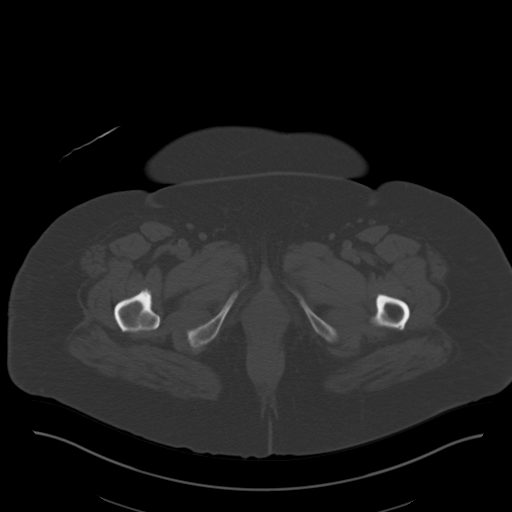
[im 17/93  soft-tissue]
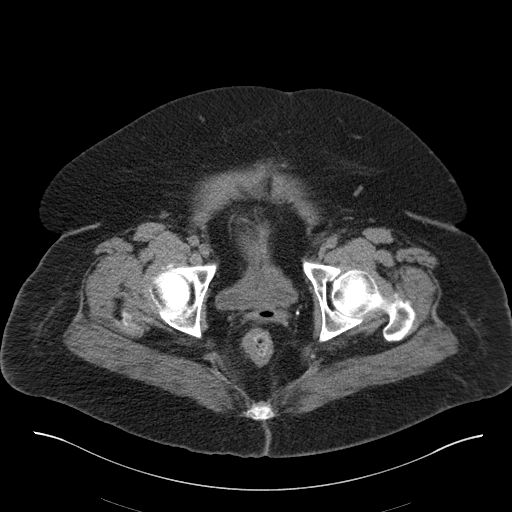
[im 22/93  soft-tissue]
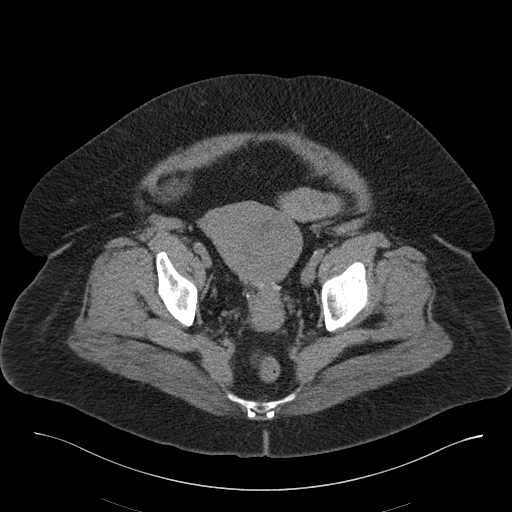
[im 28/93  soft-tissue]
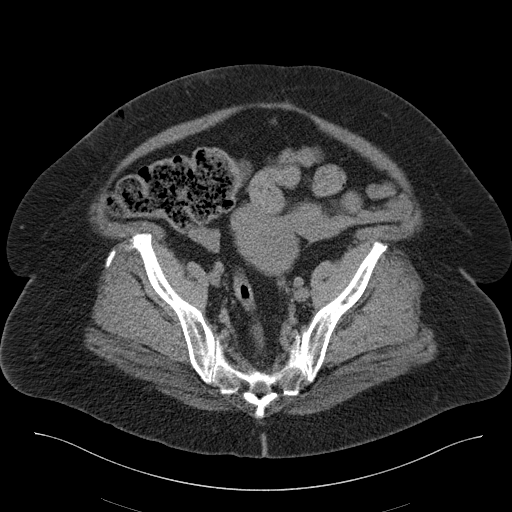
[im 38/93  soft-tissue]
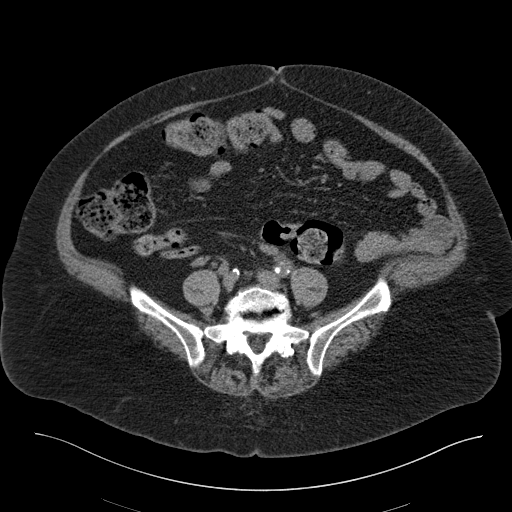
[im 44/93  soft-tissue]
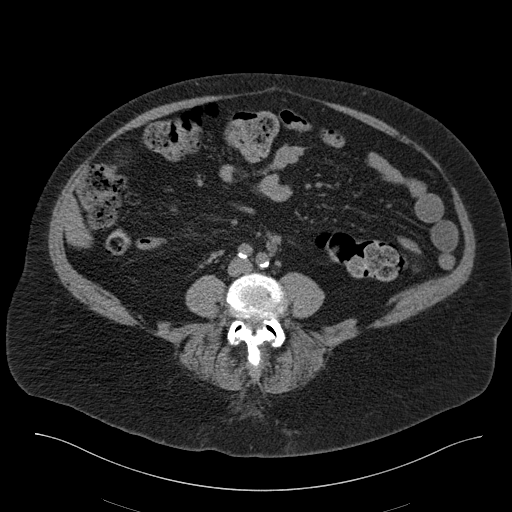
[im 49/93  soft-tissue]
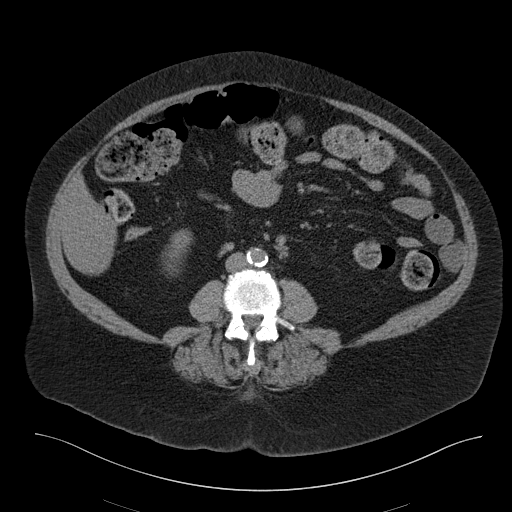
[im 60/93  soft-tissue]
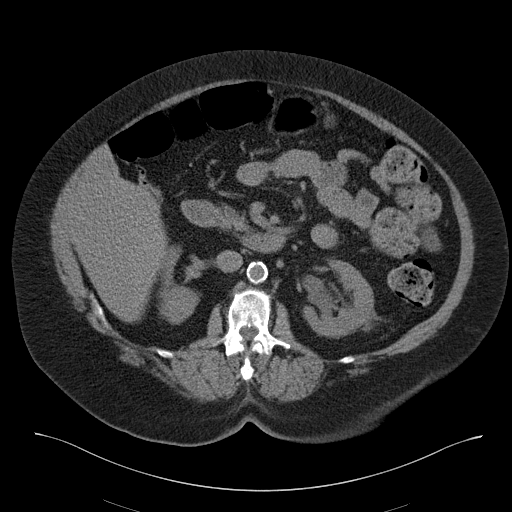
[im 65/93  soft-tissue]
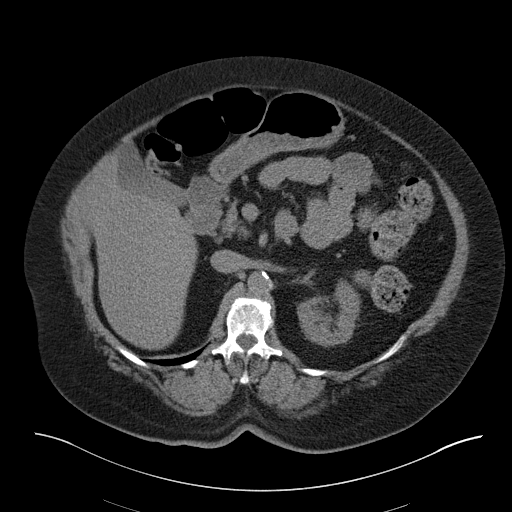
[im 65/93  bone]
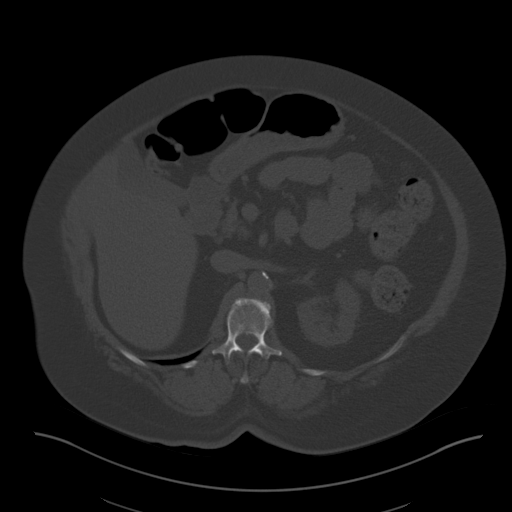
[im 71/93  soft-tissue]
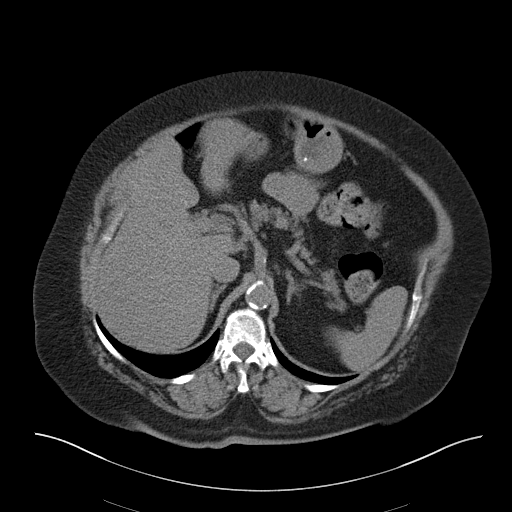
[im 82/93  soft-tissue]
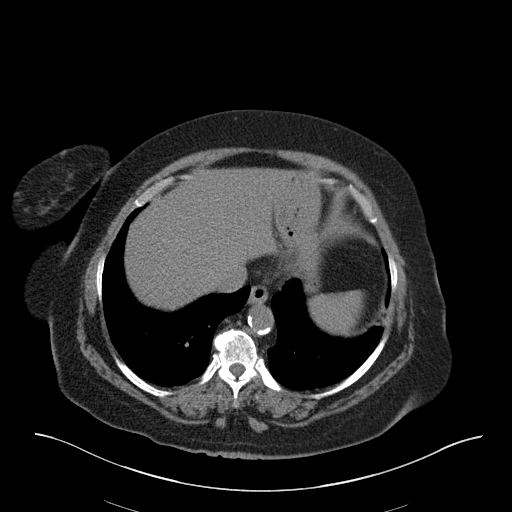
[im 87/93  soft-tissue]
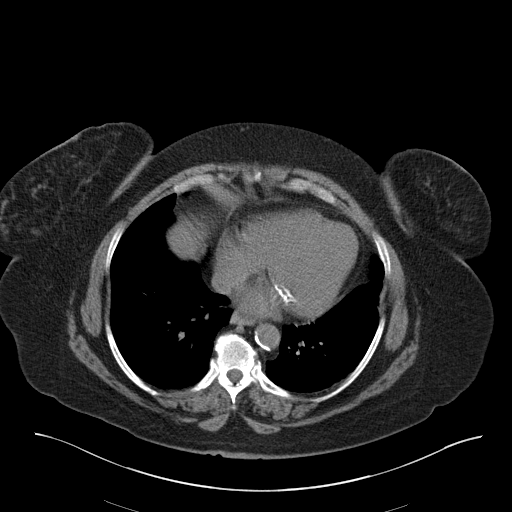

[Series 5: coronal · coronal · 0.88mm/px · 3 of 179 slices shown]
[im 60/179  soft-tissue]
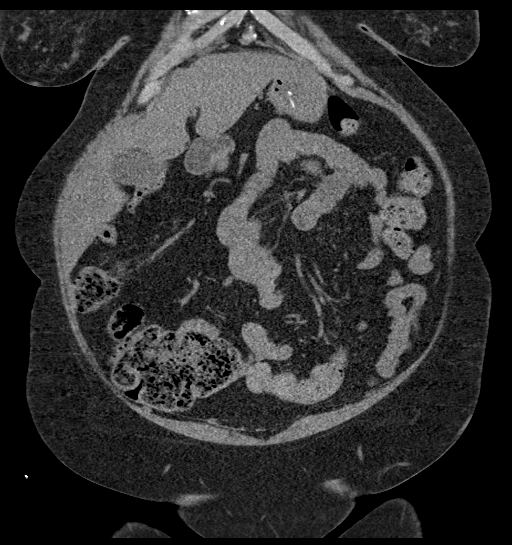
[im 80/179  soft-tissue]
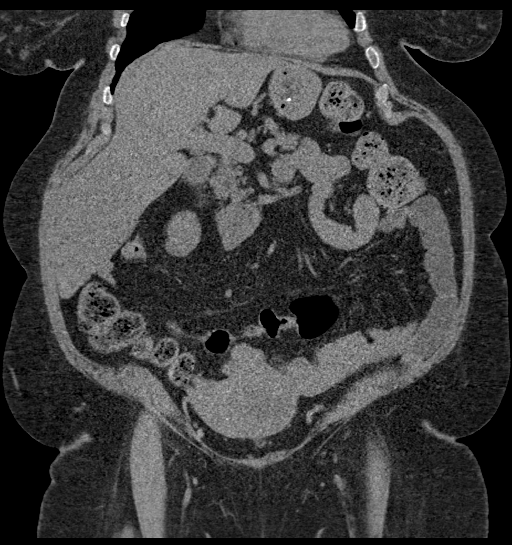
[im 99/179  soft-tissue]
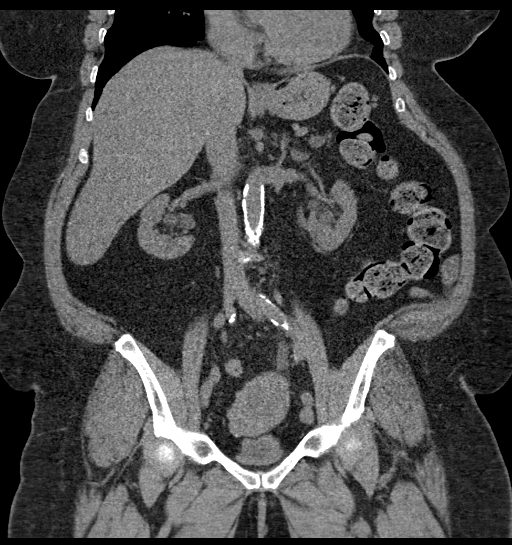

[15 of 46 positions shown; findings below may reference images not displayed]

FINDINGS: Lower chest: Dependent atelectatic changes in the lung bases with
more bandlike opacities in the left lower lobe likely reflecting
subsegmental atelectasis or scarring. Mild cardiomegaly.
Calcifications of the coronary arteries. No pericardial effusion.

Hepatobiliary: No focal liver abnormality is seen. No gallstones,
gallbladder wall thickening, or biliary dilatation.

Pancreas: Moderate pancreatic atrophy. No pancreatic ductal
dilatation or peripancreatic inflammation.

Spleen: Normal in size without focal abnormality.

Adrenals/Urinary Tract: Normal adrenal glands. Asymmetric mild left
hydroureteronephrosis to the level of a 3 mm calculus in the distal
left ureter just proximal to the ureterovesicular junction (2/76).
Minimal left perinephric and periureteral stranding. Additional
bilateral punctate nonobstructing calculi in both kidneys. No right
urinary tract dilatation. Suspect few parapelvic cysts in the
bilateral renal pelves. No concerning visible or contour deforming
renal lesion. Urinary bladder is largely decompressed at the time of
exam and therefore poorly evaluated by CT imaging. Mild wall
thickening likely related to underdistention. Some stranding along
the anterior bladder is similar to prior and is likely postsurgical
in nature related to prior Caesarean section.

Stomach/Bowel: Distal esophagus, stomach and duodenal sweep are
unremarkable. No small bowel wall thickening or dilatation. No
evidence of obstruction. Cecum is slightly mobile displaced towards
the midline abdomen. A normal appendix is visualized. No colonic
dilatation or wall thickening. There is marked redundancy of the
sigmoid colon which is displaced into the right upper quadrant
interposed anterior to the hepatic flexure and transverse colon. No
evidence of volvulus or colonic obstruction.

Vascular/Lymphatic: Atherosclerotic calcifications throughout the
abdominal aorta and branch vessels. No aneurysm or ectasia. No
enlarged abdominopelvic lymph nodes.

Reproductive: Anteverted uterus with slightly lobular contours
likely reflecting a leiomyomatous uterus. Some focal postsurgical
change seen along the anterior uterine fundus likely related to
prior Caesarean section with tethering to the anterior abdominal
wall. No concerning adnexal lesions.

Other: No abdominopelvic free fluid or free gas. No bowel containing
hernias. Small fat containing umbilical hernia. Mild skin
thickening, fat stranding and punctate foci of gas in the
superficial subcutaneous tissues of the low anterior abdominal wall
likely related to injectable use.

Musculoskeletal: No acute osseous abnormality or suspicious osseous
lesion. Multilevel degenerative changes are present in the imaged
portions of the spine slightly progressive from comparison Imaging
in 5895. Mild grade 1 anterolisthesis L4 on 5 without spondylolysis.
Additional degenerative changes in the SI joints and bilateral hips.
IMPRESSION: 1. Mild left hydroureteronephrosis to the level of a 3 mm calculus
in the distal left ureter just proximal to the ureterovesicular
junction.
2. Additional bilateral punctate nonobstructing nephrolithiasis.
3. Marked redundancy of the sigmoid colon which is displaced into
the right upper quadrant interposed anterior to the hepatic flexure
and transverse colon. No evidence of volvulus or colonic
obstruction.
4. Likely fibroid uterus with postsurgical changes from prior
Caesarean section. If clinically warranted, outpatient pelvic
ultrasound could be obtained for further evaluation.
5. Aortic Atherosclerosis ([id number]).

## 2020-06-08 MED FILL — XARELTO 20 MG TABLET: 20 | 90 days supply | Qty: 90 | Fill #1

## 2020-06-08 MED FILL — HYDRALAZINE HCL 25 MG TABS: 25 | 90 days supply | Qty: 180 | Fill #1

## 2020-06-08 MED FILL — XIGDUO XR 10 MG-1,000 MG TA: 10-1000 | 90 days supply | Qty: 90 | Fill #1

## 2020-06-08 MED FILL — TRULICITY 4.5 MG/0.5ML SOPN: 4.5 | 84 days supply | Qty: 6 | Fill #2

## 2020-06-08 MED FILL — HYDROCHLOROTHIAZIDE 12.5 MG: 12.5 | 90 days supply | Qty: 90 | Fill #1

## 2020-06-08 MED FILL — DULOXETINE HCL 60 MG CPEP: 60 | 30 days supply | Qty: 30 | Fill #0

## 2020-06-08 MED FILL — LEVOTHYROXINE 150 MCG TAB: 150 | 30 days supply | Qty: 30 | Fill #8

## 2020-06-08 MED FILL — METOPROLOL TARTRATE 50 MG T: 50 | 90 days supply | Qty: 180 | Fill #3

## 2020-06-08 MED FILL — FLECAINIDE ACETATE 50 MG TA: 50 | 90 days supply | Qty: 180 | Fill #3

## 2020-06-08 MED FILL — OMEPRAZOLE 20 MG CAP: 20 | 90 days supply | Qty: 90 | Fill #2

## 2020-06-10 DIAGNOSIS — E113511 Type 2 diabetes mellitus with proliferative diabetic retinopathy with macular edema, right eye: Secondary | ICD-10-CM | POA: Diagnosis not present

## 2020-06-25 ENCOUNTER — Other Ambulatory Visit (HOSPITAL_COMMUNITY): Payer: Self-pay

## 2020-06-25 MED ORDER — AMOXICILLIN 500 MG PO CAPS
2000.0000 mg | ORAL_CAPSULE | ORAL | 0 refills | Status: DC
  Filled 2020-06-25: qty 16, 4d supply, fill #0

## 2020-06-26 ENCOUNTER — Other Ambulatory Visit (HOSPITAL_COMMUNITY): Payer: Self-pay

## 2020-06-26 MED FILL — Losartan Potassium Tab 50 MG: ORAL | 90 days supply | Qty: 90 | Fill #0 | Status: CN

## 2020-07-04 ENCOUNTER — Other Ambulatory Visit (HOSPITAL_COMMUNITY): Payer: Self-pay

## 2020-07-06 ENCOUNTER — Other Ambulatory Visit (HOSPITAL_COMMUNITY): Payer: Self-pay

## 2020-07-06 MED FILL — Levothyroxine Sodium Tab 150 MCG: ORAL | 30 days supply | Qty: 30 | Fill #0 | Status: AC

## 2020-07-06 MED FILL — Duloxetine HCl Enteric Coated Pellets Cap 60 MG (Base Eq): ORAL | 30 days supply | Qty: 30 | Fill #0 | Status: AC

## 2020-07-06 MED FILL — Amlodipine Besylate Tab 5 MG (Base Equivalent): ORAL | 90 days supply | Qty: 90 | Fill #0 | Status: AC

## 2020-07-06 MED FILL — Losartan Potassium Tab 50 MG: ORAL | 90 days supply | Qty: 90 | Fill #0 | Status: AC

## 2020-07-07 ENCOUNTER — Other Ambulatory Visit (HOSPITAL_COMMUNITY): Payer: Self-pay

## 2020-07-07 MED FILL — Insulin Glargine-yfgn Soln Pen-Injector 100 Unit/ML: SUBCUTANEOUS | 28 days supply | Qty: 30 | Fill #0 | Status: AC

## 2020-07-15 DIAGNOSIS — E113511 Type 2 diabetes mellitus with proliferative diabetic retinopathy with macular edema, right eye: Secondary | ICD-10-CM | POA: Diagnosis not present

## 2020-07-21 DIAGNOSIS — M25532 Pain in left wrist: Secondary | ICD-10-CM | POA: Diagnosis not present

## 2020-07-21 DIAGNOSIS — M24131 Other articular cartilage disorders, right wrist: Secondary | ICD-10-CM | POA: Diagnosis not present

## 2020-07-30 MED FILL — Insulin Glargine-yfgn Soln Pen-Injector 100 Unit/ML: SUBCUTANEOUS | 28 days supply | Qty: 30 | Fill #1 | Status: AC

## 2020-07-31 ENCOUNTER — Other Ambulatory Visit (HOSPITAL_COMMUNITY): Payer: Self-pay

## 2020-08-03 DIAGNOSIS — Z961 Presence of intraocular lens: Secondary | ICD-10-CM | POA: Diagnosis not present

## 2020-08-03 DIAGNOSIS — H3581 Retinal edema: Secondary | ICD-10-CM | POA: Diagnosis not present

## 2020-08-03 DIAGNOSIS — E113312 Type 2 diabetes mellitus with moderate nonproliferative diabetic retinopathy with macular edema, left eye: Secondary | ICD-10-CM | POA: Diagnosis not present

## 2020-08-03 DIAGNOSIS — E113511 Type 2 diabetes mellitus with proliferative diabetic retinopathy with macular edema, right eye: Secondary | ICD-10-CM | POA: Diagnosis not present

## 2020-08-03 DIAGNOSIS — H3582 Retinal ischemia: Secondary | ICD-10-CM | POA: Diagnosis not present

## 2020-08-03 DIAGNOSIS — H34831 Tributary (branch) retinal vein occlusion, right eye, with macular edema: Secondary | ICD-10-CM | POA: Diagnosis not present

## 2020-08-03 DIAGNOSIS — H35371 Puckering of macula, right eye: Secondary | ICD-10-CM | POA: Diagnosis not present

## 2020-08-13 ENCOUNTER — Other Ambulatory Visit (HOSPITAL_COMMUNITY): Payer: Self-pay

## 2020-08-13 MED FILL — Atorvastatin Calcium Tab 40 MG (Base Equivalent): ORAL | 90 days supply | Qty: 90 | Fill #0 | Status: AC

## 2020-08-13 MED FILL — Levothyroxine Sodium Tab 150 MCG: ORAL | 30 days supply | Qty: 30 | Fill #1 | Status: AC

## 2020-08-14 ENCOUNTER — Other Ambulatory Visit (HOSPITAL_COMMUNITY): Payer: Self-pay

## 2020-08-17 ENCOUNTER — Other Ambulatory Visit (HOSPITAL_COMMUNITY): Payer: Self-pay

## 2020-08-17 MED FILL — Indomethacin Cap 50 MG: ORAL | 6 days supply | Qty: 20 | Fill #0 | Status: AC

## 2020-08-18 ENCOUNTER — Other Ambulatory Visit (HOSPITAL_COMMUNITY): Payer: Self-pay

## 2020-08-18 DIAGNOSIS — M24131 Other articular cartilage disorders, right wrist: Secondary | ICD-10-CM | POA: Diagnosis not present

## 2020-08-18 MED ORDER — DULOXETINE HCL 60 MG PO CPEP: 60.0000 mg | ORAL_CAPSULE | Freq: Every day | ORAL | 2 refills | Status: DC

## 2020-08-18 MED ORDER — DULOXETINE HCL 60 MG PO CPEP
60.0000 mg | ORAL_CAPSULE | Freq: Every day | ORAL | 2 refills | Status: DC
  Filled 2020-08-18: qty 90, 90d supply, fill #0
  Filled 2020-11-17 – 2020-11-18 (×3): qty 90, 90d supply, fill #1
  Filled 2021-02-22: qty 90, 90d supply, fill #2

## 2020-08-19 DIAGNOSIS — E113511 Type 2 diabetes mellitus with proliferative diabetic retinopathy with macular edema, right eye: Secondary | ICD-10-CM | POA: Diagnosis not present

## 2020-08-21 ENCOUNTER — Other Ambulatory Visit (HOSPITAL_COMMUNITY): Payer: Self-pay

## 2020-08-26 MED FILL — Insulin Glargine-yfgn Soln Pen-Injector 100 Unit/ML: SUBCUTANEOUS | 28 days supply | Qty: 30 | Fill #2 | Status: AC

## 2020-08-27 ENCOUNTER — Other Ambulatory Visit (HOSPITAL_COMMUNITY): Payer: Self-pay

## 2020-08-27 DIAGNOSIS — Z87442 Personal history of urinary calculi: Secondary | ICD-10-CM | POA: Diagnosis not present

## 2020-08-27 DIAGNOSIS — Z794 Long term (current) use of insulin: Secondary | ICD-10-CM | POA: Diagnosis not present

## 2020-08-27 DIAGNOSIS — E039 Hypothyroidism, unspecified: Secondary | ICD-10-CM | POA: Diagnosis not present

## 2020-08-27 DIAGNOSIS — E1165 Type 2 diabetes mellitus with hyperglycemia: Secondary | ICD-10-CM | POA: Diagnosis not present

## 2020-08-28 ENCOUNTER — Other Ambulatory Visit (HOSPITAL_COMMUNITY): Payer: Self-pay

## 2020-08-31 DIAGNOSIS — E113312 Type 2 diabetes mellitus with moderate nonproliferative diabetic retinopathy with macular edema, left eye: Secondary | ICD-10-CM | POA: Diagnosis not present

## 2020-08-31 DIAGNOSIS — E1165 Type 2 diabetes mellitus with hyperglycemia: Secondary | ICD-10-CM | POA: Diagnosis not present

## 2020-09-15 ENCOUNTER — Other Ambulatory Visit (HOSPITAL_COMMUNITY): Payer: Self-pay

## 2020-09-15 ENCOUNTER — Other Ambulatory Visit: Payer: Self-pay | Admitting: Cardiovascular Disease

## 2020-09-15 MED FILL — Dulaglutide Soln Auto-injector 4.5 MG/0.5ML: SUBCUTANEOUS | 84 days supply | Qty: 6 | Fill #0 | Status: AC

## 2020-09-15 MED FILL — Hydrochlorothiazide Cap 12.5 MG: ORAL | 90 days supply | Qty: 90 | Fill #0 | Status: AC

## 2020-09-15 MED FILL — Hydralazine HCl Tab 25 MG: ORAL | 90 days supply | Qty: 180 | Fill #0 | Status: AC

## 2020-09-16 ENCOUNTER — Other Ambulatory Visit (HOSPITAL_COMMUNITY): Payer: Self-pay

## 2020-09-16 MED ORDER — LEVOTHYROXINE SODIUM 150 MCG PO TABS
ORAL_TABLET | ORAL | 3 refills | Status: DC
  Filled 2020-09-16: qty 90, 90d supply, fill #0
  Filled 2020-12-23: qty 90, 90d supply, fill #1

## 2020-09-17 ENCOUNTER — Other Ambulatory Visit (HOSPITAL_COMMUNITY): Payer: Self-pay

## 2020-09-17 ENCOUNTER — Other Ambulatory Visit: Payer: Self-pay | Admitting: Cardiovascular Disease

## 2020-09-17 MED ORDER — METOPROLOL TARTRATE 50 MG PO TABS
50.0000 mg | ORAL_TABLET | Freq: Two times a day (BID) | ORAL | 0 refills | Status: DC
  Filled 2020-09-17: qty 180, 90d supply, fill #0

## 2020-09-18 ENCOUNTER — Other Ambulatory Visit (HOSPITAL_COMMUNITY): Payer: Self-pay

## 2020-09-20 MED FILL — Insulin Glargine-yfgn Soln Pen-Injector 100 Unit/ML: SUBCUTANEOUS | 28 days supply | Qty: 30 | Fill #3 | Status: AC

## 2020-09-21 ENCOUNTER — Other Ambulatory Visit (HOSPITAL_COMMUNITY): Payer: Self-pay

## 2020-09-22 DIAGNOSIS — G5621 Lesion of ulnar nerve, right upper limb: Secondary | ICD-10-CM | POA: Diagnosis not present

## 2020-09-22 DIAGNOSIS — M25531 Pain in right wrist: Secondary | ICD-10-CM | POA: Diagnosis not present

## 2020-09-22 DIAGNOSIS — M24131 Other articular cartilage disorders, right wrist: Secondary | ICD-10-CM | POA: Diagnosis not present

## 2020-09-22 DIAGNOSIS — M654 Radial styloid tenosynovitis [de Quervain]: Secondary | ICD-10-CM | POA: Diagnosis not present

## 2020-09-22 DIAGNOSIS — M25532 Pain in left wrist: Secondary | ICD-10-CM | POA: Diagnosis not present

## 2020-09-25 DIAGNOSIS — E113312 Type 2 diabetes mellitus with moderate nonproliferative diabetic retinopathy with macular edema, left eye: Secondary | ICD-10-CM | POA: Diagnosis not present

## 2020-09-30 DIAGNOSIS — E113511 Type 2 diabetes mellitus with proliferative diabetic retinopathy with macular edema, right eye: Secondary | ICD-10-CM | POA: Diagnosis not present

## 2020-10-01 ENCOUNTER — Other Ambulatory Visit: Payer: Self-pay | Admitting: Cardiovascular Disease

## 2020-10-01 ENCOUNTER — Other Ambulatory Visit (HOSPITAL_COMMUNITY): Payer: Self-pay

## 2020-10-01 ENCOUNTER — Encounter: Payer: Self-pay | Admitting: Neurology

## 2020-10-01 MED ORDER — FLECAINIDE ACETATE 50 MG PO TABS
50.0000 mg | ORAL_TABLET | Freq: Two times a day (BID) | ORAL | 0 refills | Status: DC
  Filled 2020-10-01: qty 180, 90d supply, fill #0

## 2020-10-01 MED ORDER — RIVAROXABAN 20 MG PO TABS
20.0000 mg | ORAL_TABLET | Freq: Every day | ORAL | 0 refills | Status: DC
  Filled 2020-10-01: qty 90, 90d supply, fill #0

## 2020-10-01 MED FILL — Omeprazole Cap Delayed Release 20 MG: ORAL | 90 days supply | Qty: 90 | Fill #0 | Status: AC

## 2020-10-01 MED FILL — Dapagliflozin Prop-Metformin HCl Tab ER 24HR 10-1000 MG: ORAL | 90 days supply | Qty: 90 | Fill #0 | Status: AC

## 2020-10-01 NOTE — Telephone Encounter (Signed)
Pt last saw Dr Eden Emms 08/02/19, pt is overdue for follow-up.  Pt has follow-up appt scheduled with Dr Eden Emms for 11/04/20.  Last labs 03/23/20 Creat 1.31 at [facility] per [facility], age 70, weight 111.1kg, CrCl 69.08, based on specified criteria pt is on appropriate dosage of Xarelto 20mg  QD.  Will refill rx to get pt to upcoming appt with Dr .

## 2020-10-02 ENCOUNTER — Other Ambulatory Visit (HOSPITAL_COMMUNITY): Payer: Self-pay

## 2020-10-02 MED ORDER — AMLODIPINE BESYLATE 5 MG PO TABS
ORAL_TABLET | ORAL | 3 refills | Status: DC
  Filled 2020-10-02: qty 90, 90d supply, fill #0
  Filled 2021-01-29: qty 90, 90d supply, fill #1
  Filled 2021-04-27: qty 90, 90d supply, fill #2

## 2020-10-02 MED ORDER — INDOMETHACIN 50 MG PO CAPS
ORAL_CAPSULE | ORAL | 4 refills | Status: DC
  Filled 2020-10-02: qty 20, 6d supply, fill #0
  Filled 2021-02-22: qty 20, 6d supply, fill #1
  Filled 2021-05-17: qty 20, 6d supply, fill #2
  Filled 2021-07-21: qty 20, 6d supply, fill #3
  Filled 2021-08-24: qty 20, 6d supply, fill #4

## 2020-10-05 ENCOUNTER — Other Ambulatory Visit (HOSPITAL_COMMUNITY): Payer: Self-pay

## 2020-10-05 DIAGNOSIS — E113312 Type 2 diabetes mellitus with moderate nonproliferative diabetic retinopathy with macular edema, left eye: Secondary | ICD-10-CM | POA: Diagnosis not present

## 2020-10-13 ENCOUNTER — Other Ambulatory Visit (HOSPITAL_COMMUNITY): Payer: Self-pay

## 2020-10-13 MED FILL — Insulin Glargine-yfgn Soln Pen-Injector 100 Unit/ML: SUBCUTANEOUS | 28 days supply | Qty: 30 | Fill #4 | Status: AC

## 2020-10-29 ENCOUNTER — Ambulatory Visit: Payer: 59 | Attending: Internal Medicine

## 2020-10-29 ENCOUNTER — Other Ambulatory Visit (HOSPITAL_BASED_OUTPATIENT_CLINIC_OR_DEPARTMENT_OTHER): Payer: Self-pay

## 2020-10-29 DIAGNOSIS — Z23 Encounter for immunization: Secondary | ICD-10-CM

## 2020-10-29 MED ORDER — PFIZER-BIONT COVID-19 VAC-TRIS 30 MCG/0.3ML IM SUSP
INTRAMUSCULAR | 0 refills | Status: DC
  Filled 2020-10-29: qty 0.3, 1d supply, fill #0

## 2020-10-29 NOTE — Progress Notes (Signed)
   Covid-19 Vaccination Clinic  Name:  [name]    MRN: [id number] DOB: 1950/04/20  10/29/2020  [name] was observed post Covid-19 immunization for 15 minutes without incident. She was provided with Vaccine Information Sheet and instruction to access the V-Safe system.   [name] was instructed to call 911 with any severe reactions post vaccine: Difficulty breathing  Swelling of face and throat  A fast heartbeat  A bad rash all over body  Dizziness and weakness   Immunizations Administered     Name Date Dose VIS Date Route   PFIZER Comrnaty(Gray TOP) Covid-19 Vaccine 10/29/2020 11:30 AM 0.3 mL 02/20/2020 Intramuscular   Manufacturer: [employer], [employer]   Lot: [id number]   NDC: [phone]

## 2020-10-30 NOTE — Progress Notes (Signed)
Cardiology Office Note   Date:  11/04/2020   ID:  [name], DOB 02/28/1951, MRN [id number]  PCP:  Marden Noble, MD  Cardiologist:   Charlton Haws, MD   No chief complaint on file.     History of Present Illness: [name] is a 70 y.o. female who presents for f/u of PAF. She has been Rx with flecainide, xarelto and beta blocker  TTE March 2018 and 2019  normal EF and normal LA size. Normal myovue 07/21/16  Has had multiple orthopedic surgeries last  2 years and held NOAC with no issues Also lumbar spine injections CHADVASC 4 no bleeding issues   She injured her back and left foot. Weight is up and DM poorly controlled Weight loss doctor wanted to start Wellbutrin but interacts with flecainide I told her we could lower dose and monitor ECG if she needed to use this medicine as her weight is a bigger health issue than her PAF at this time as she has maintained NSR for a while   Her last surgery with on right wrist with Dr Mina Marble 05/01/19 no issues holding xarelto for 48 hours Has also had  Two different eye surgeries and hurt her elbow  Started on Cymbalta 08/18/20 Flecainide dose decreased to 50 mg bid due to CYP interaction  Discussed trying to do even limited ETT to make sure no arrhythmia or QRS widening   Past Medical History:  Diagnosis Date   A-fib (HCC)    Anemia    hx of   Arthritis    Chest pain    Constipation    Depression    Diabetes mellitus ORAL MEDS   Diabetic retinopathy (HCC)    Dyspnea    with minimal exertion-deconditioned   Dyspnea    Dysrhythmia    a-fib   Food allergy    GERD (gastroesophageal reflux disease)    occasional   Gout    History of kidney stones    Hypercholesteremia    Hyperlipidemia    Hypertension    Hypertensive kidney disease    Hypothyroidism    Knee pain, right    Left arm numbness DUE TO CERVICAL PINCHED NERVE   Obesity    OSA on CPAP    uses CPAP nightly   Osteoarthritis    Palpitations    Pinched nerve in  neck    Pleurisy    Pneumonia    hx of   PONV (postoperative nausea and vomiting)    for 3-4 days after general anesthesia approx 10-12 years ago   Sciatica    Swelling of knee joint, right    Synovitis of knee RIGHT   Trigger finger, left    left index   Vitamin D deficiency     Past Surgical History:  Procedure Laterality Date   CESAREAN SECTION  X3   KNEE ARTHROSCOPY  04/20/2011   Procedure: ARTHROSCOPY KNEE;  Surgeon: Loanne Drilling, MD;  Location: [facility];  Service: Orthopedics;  Laterality: Right;  WITH SYNOVECTOMY   LEFT CARPAL TUNNEL / LEFT MIDDLE & RING FINGER TRIGGER RELEASE  08-26-2008   LEFT SHOULDER ARTHROSCOPY W/ DEBRIDEMENT  09-09-2003   LEFT SHOULDER ARTHROSCOPY/ LEFT THUMB TRIGGER RELEASE  02-22-2005   PHOTOCOAGULATION WITH LASER Right 03/20/2018   Procedure: PHOTOCOAGULATION WITH LASER;  Surgeon: Carmela Rima, MD;  Location: [facility] OR;  Service: Ophthalmology;  Laterality: Right;   PULLEY RELEASE LEFT LONG FINGER  07-14-2009   RADIAL HEAD ARTHROPLASTY  Right 06/17/2018   Procedure: RADIAL HEAD ARTHROPLASTY;  Surgeon: Bjorn Pippin, MD;  Location: [facility] OR;  Service: Orthopedics;  Laterality: Right;   RIGHT CARPAL TUNNEL/ RIGHT THUMB TRIGGER RELEASE'S  11-28-2006   RIGHT SHOULDER ARTHROSCOPY W/ ROTATOR CUFF REPAIR  01-13-2004   SHOULDER ARTHROSCOPY DISTAL CLAVICLE EXCISION AND OPEN ROTATOR CUFF REPAIR  09-07-2004   LEFT   SHOULDER ARTHROSCOPY W/ ACROMIAL REPAIR  11-29-2005   LEFT   TONSILLECTOMY AND ADENOIDECTOMY  child   TOTAL KNEE ARTHROPLASTY  12-14-2009   RIGHT   TOTAL KNEE ARTHROPLASTY Left 11/05/2012   Procedure: LEFT TOTAL KNEE ARTHROPLASTY;  Surgeon: Loanne Drilling, MD;  Location: [facility];  Service: Orthopedics;  Laterality: Left;   TOTAL KNEE REVISION Right 07/29/2016   Procedure: RIGHT KNEE POLY-LINER EXCHANGE;  Surgeon: Kathryne Hitch, MD;  Location: [facility];  Service: Orthopedics;  Laterality: Right;   TRIGGER FINGER RELEASE Right  02/21/2013   Procedure: RIGHT RING A-1 PULLEY RELEASE    (MINOR PROCEDURE) ;  Surgeon: Wyn Forster., MD;  Location[facility];  Service: Orthopedics;  Laterality: Right;   TRIGGER FINGER RELEASE Left 09/22/2016   Procedure: RELEASE TRIGGER FINGER LEFT INDEX FINGER;  Surgeon: Kathryne Hitch, MD;  Location: [facility] OR;  Service: Orthopedics;  Laterality: Left;   TRIGGER FINGER RELEASE Right 01/20/2017   Procedure: RELEASE TRIGGER FINGER/A-1 PULLEY RIGHT INDEX FINGER;  Surgeon: Marcene Corning, MD;  Location: [facility];  Service: Orthopedics;  Laterality: Right;   VITRECTOMY 25 GAUGE WITH SCLERAL BUCKLE Right 03/20/2018   Procedure: RIGHT EYE VITRECTOMY WITH  ENDOLASER PARENTAL PHOTOCOAGULATION 25 GAUGE;  Surgeon: Carmela Rima, MD;  Location: [facility] OR;  Service: Ophthalmology;  Laterality: Right;   WRIST ARTHROSCOPY WITH DEBRIDEMENT Right 05/01/2019   Procedure: WRIST ARTHROSCOPY WITH DEBRIDEMENT;  Surgeon: Dairl Ponder, MD;  Location: [facility];  Service: Orthopedics;  Laterality: Right;     Current Outpatient Medications  Medication Sig Dispense Refill   amLODipine (NORVASC) 5 MG tablet Take 5 mg by mouth every evening.      amoxicillin (AMOXIL) 500 MG capsule Take 4 capsules (2,000 mg total) by mouth 1 hour prior to dental appointment 16 capsule 0   atorvastatin (LIPITOR) 40 MG tablet Take 40 mg by mouth every evening.      calcium carbonate (OS-CAL) 600 MG TABS tablet Take 600 mg by mouth daily with breakfast.     cholecalciferol (VITAMIN D3) 25 MCG (1000 UT) tablet Take 1,000 Units by mouth daily.     Dapagliflozin-metFORMIN HCl ER 12-998 MG TB24 TAKE 1 TABLET BY MOUTH ONCE A DAY 90 tablet 4   Dulaglutide 4.5 MG/0.5ML SOPN INJECT 4.5 MG UNDER THE SKIN ONCE A WEEK. 2 mL 11   DULoxetine (CYMBALTA) 60 MG capsule Take 60 mg by mouth daily.     flecainide (TAMBOCOR) 50 MG tablet Take 1 tablet (50 mg total) by mouth 2 (two) times daily.  Keep office visit. 180 tablet 0   hydrALAZINE (APRESOLINE) 25 MG tablet TAKE 1 TABLET BY MOUTH 2 TIMES DAILY WITH BREAKFAST AND DINNER 180 tablet 3   hydrochlorothiazide (MICROZIDE) 12.5 MG capsule TAKE 1 CAPSULE BY MOUTH ONCE DAILY IN MORNING 90 capsule 3   HYDROcodone-acetaminophen (NORCO/VICODIN) 5-325 MG tablet TAKE 1 TABLET BY MOUTH EVERY 6 HOURS AS NEEDED FOR MODERATE PAIN 30 tablet 0   indomethacin (INDOCIN) 50 MG capsule TAKE 1 CAPSULE BY MOUTH TWO TO THREE TIMES DAILY AS NEEDED FOR GOUT PAIN  20 capsule 4   insulin glargine-yfgn (SEMGLEE) 100 UNIT/ML Pen INJECT 60 UNITS UNDER THE SKIN 2 TIMES DAILY 120 mL 3   losartan (COZAAR) 50 MG tablet TAKE 1 TABLET BY MOUTH ONCE A DAY 90 tablet 4   Melatonin 5 MG CAPS Take 5-10 mg by mouth at bedtime as needed (sleep).      metFORMIN (GLUCOPHAGE-XR) 500 MG 24 hr tablet Take 1,000 mg by mouth daily with supper.     metoprolol tartrate (LOPRESSOR) 50 MG tablet Take 1 tablet (50 mg total) by mouth 2 (two) times daily. 180 tablet 0   omeprazole (PRILOSEC) 20 MG capsule Take 20 mg by mouth daily.      ondansetron (ZOFRAN) 4 MG tablet Take 1 tablet (4 mg total) by mouth every 8 (eight) hours as needed for nausea or vomiting. 10 tablet 0   oxyCODONE-acetaminophen (PERCOCET/ROXICET) 5-325 MG tablet Take 1 tablet by mouth every 6 (six) hours as needed for severe pain. 15 tablet 0   rivaroxaban (XARELTO) 20 MG TABS tablet Take 1 tablet (20 mg total) by mouth daily with supper. Pt is Overdue for follow-up with MD, MUST see MD for FUTURE refills. 90 tablet 0   tamsulosin (FLOMAX) 0.4 MG CAPS capsule Take 1 po QD until you pass the stone. 10 capsule 0   TRULICITY 3 MG/0.5ML SOPN Inject 3 mg as directed every Sunday.   11   valsartan (DIOVAN) 320 MG tablet Take 320 mg by mouth daily.   3   cephALEXin (KEFLEX) 500 MG capsule Take 1 capsule (500 mg total) by mouth 3 (three) times daily. (Patient not taking: Reported on 11/04/2020) 21 capsule 0   COVID-19 mRNA Vac-TriS,  Pfizer, (PFIZER-BIONT COVID-19 VAC-TRIS) SUSP injection Inject into the muscle. (Patient not taking: Reported on 11/04/2020) 0.3 mL 0   Insulin Glargine (BASAGLAR KWIKPEN) 100 UNIT/ML INJECT 50 UNITS UNDER THE SKIN 2 TIMES DAILY (Patient not taking: Reported on 11/04/2020) 30 mL 6   levothyroxine (SYNTHROID) 150 MCG tablet TAKE 1 TABLET BY MOUTH IN THE MORNING ON AN EMPTY STOMACH 30 tablet 11   Levothyroxine Sodium 137 MCG CAPS Take 137 mcg by mouth daily before breakfast.  (Patient not taking: Reported on 11/04/2020)     No current facility-administered medications for this visit.    Allergies:   Actos [pioglitazone hydrochloride], Avandia [rosiglitazone], Tetanus toxoids, and Food    Social History:  The patient  reports that she quit smoking about 43 years ago. Her smoking use included cigarettes. She has never used smokeless tobacco. She reports that she does not drink alcohol and does not use drugs.   Family History:  The patient's family history includes Alcoholism in her father; Cancer in her father; Diabetes in her mother; Heart attack in her mother; Hypertension in her mother; Liver disease in her father; Obesity in her mother; Sleep apnea in her father; Thyroid disease in her mother.    ROS:  Please see the history of present illness.   Otherwise, review of systems are positive for none.   All other systems are reviewed and negative.    PHYSICAL EXAM: VS:  BP 112/64   Pulse 67   Ht 5\' 2"  (1.575 m)   Wt 112.5 kg   SpO2 96%   BMI 45.36 kg/m  , BMI Body mass index is 45.36 kg/m. Affect appropriate Healthy:  appears stated age HEENT: normal Neck supple with no adenopathy JVP normal no bruits no thyromegaly Lungs clear with no wheezing and good diaphragmatic  motion Heart:  S1/S2 no murmur, no rub, gallop or click PMI normal Abdomen: benighn, BS positve, no tenderness, no AAA no bruit.  No HSM or HJR Distal pulses intact with no bruits No edema Neuro non-focal Skin warm and  dry Post right wrist surgery     EKG:  08/02/19 SR rate 66 normal 11/04/2020 NSR rate 67 poor R wave progression    Recent Labs: No results found for requested labs within last 8760 hours.    Lipid Panel    Component Value Date/Time   CHOL 126 07/18/2017 1112   TRIG 98 07/18/2017 1112   HDL 43 07/18/2017 1112   LDLCALC 63 07/18/2017 1112      Wt Readings from Last 3 Encounters:  11/04/20 112.5 kg  08/06/19 113.4 kg  08/02/19 119.8 kg      Other studies Reviewed: Additional studies/ records that were reviewed today include: notes from othro, labs TTE 2018/2019 myovue ECG;s .    ASSESSMENT AND PLAN:  1.  PAF:  Maintaining NSR continue flecainide at lower dose due to Cymbalta and CYP interaction F/U ETT r/o pro arrhythmia   2. Anticoagulation no bleeding issues continue Xarelto   3. HTN:  Well controlled.  Continue current medications and low sodium Dash type diet.    4. HLD  Continue statin labs with primary   5. Obesity:  F/u weight loss clinic On Cymbalta now     Current medicines are reviewed at length with the patient today.  The patient does not have concerns regarding medicines.  The following changes have been made:  no change  Labs/ tests ordered today include: None   Orders Placed This Encounter  Procedures   Exercise Tolerance Test   EKG 12-Lead      Disposition:   FU with cardiology in a year      Signed, Charlton HawsPeter Madell Heino, MD  11/04/2020 3:58 PM    [facility] [address], [place], [place]  [address] Phone: [phone]; Fax: [phone]

## 2020-11-02 DIAGNOSIS — E113511 Type 2 diabetes mellitus with proliferative diabetic retinopathy with macular edema, right eye: Secondary | ICD-10-CM | POA: Diagnosis not present

## 2020-11-03 ENCOUNTER — Other Ambulatory Visit: Payer: Self-pay

## 2020-11-03 DIAGNOSIS — R202 Paresthesia of skin: Secondary | ICD-10-CM

## 2020-11-04 ENCOUNTER — Ambulatory Visit (INDEPENDENT_AMBULATORY_CARE_PROVIDER_SITE_OTHER): Payer: 59 | Admitting: Neurology

## 2020-11-04 ENCOUNTER — Encounter: Payer: Self-pay | Admitting: Cardiovascular Disease

## 2020-11-04 ENCOUNTER — Ambulatory Visit (INDEPENDENT_AMBULATORY_CARE_PROVIDER_SITE_OTHER): Payer: 59 | Admitting: Cardiovascular Disease

## 2020-11-04 ENCOUNTER — Other Ambulatory Visit: Payer: Self-pay

## 2020-11-04 VITALS — BP 112/64 | HR 67 | Ht 62.0 in | Wt 248.0 lb

## 2020-11-04 DIAGNOSIS — E782 Mixed hyperlipidemia: Secondary | ICD-10-CM

## 2020-11-04 DIAGNOSIS — I480 Paroxysmal atrial fibrillation: Secondary | ICD-10-CM

## 2020-11-04 DIAGNOSIS — R202 Paresthesia of skin: Secondary | ICD-10-CM

## 2020-11-04 DIAGNOSIS — G5621 Lesion of ulnar nerve, right upper limb: Secondary | ICD-10-CM

## 2020-11-04 DIAGNOSIS — I10 Essential (primary) hypertension: Secondary | ICD-10-CM

## 2020-11-04 MED FILL — Insulin Glargine-yfgn Soln Pen-Injector 100 Unit/ML: SUBCUTANEOUS | 28 days supply | Qty: 30 | Fill #5 | Status: AC

## 2020-11-04 NOTE — Procedures (Signed)
[facility] Neurology  [address] [place], [address]  [place], [place] [address] Tel: [phone] Fax:  [phone] Test Date:  11/04/2020  Patient: [name] DOB: 1951/02/03 Physician: Nita Sickle, DO  Sex: Female Height: 5\' 1"  Ref Phys:  ID#: [name]   Technician:    Patient Complaints: This is a 71 year old female referred for evaluation of bilateral hand paresthesias.  NCV & EMG Findings: Extensive electrodiagnostic testing of the right upper extremity and additional studies of the left shows:  Bilateral median, ulnar, and mixed palmar sensory responses are within normal limits. Right ulnar motor response shows slowed conduction velocity across the elbow (A Elbow-B Elbow, 45 m/s).  Bilateral median and left ulnar motor responses are within normal limits.   Chronic motor axonal loss changes are seen affecting the ulnar innervated muscles on the right, without accompanied active denervation.    Impression: Right ulnar neuropathy with slowing across the elbow, purely demyelinating, mild. There is no evidence of carpal tunnel syndrome or a cervical radiculopathy affecting the upper extremities.   ___________________________ 62, DO    Nerve Conduction Studies Anti Sensory Summary Table   Stim Site NR Peak (ms) Norm Peak (ms) P-T Amp (V) Norm P-T Amp  Left Median Anti Sensory (2nd Digit)  33C  Wrist    3.6 <3.8 20.7 >10  Right Median Anti Sensory (2nd Digit)  33C  Wrist    3.7 <3.8 14.4 >10  Left Ulnar Anti Sensory (5th Digit)  33C  Wrist    2.9 <3.2 18.8 >5  Right Ulnar Anti Sensory (5th Digit)  33C  Wrist    2.6 <3.2 15.8 >5   Motor Summary Table   Stim Site NR Onset (ms) Norm Onset (ms) O-P Amp (mV) Norm O-P Amp Site1 Site2 Delta-0 (ms) Dist (cm) Vel (m/s) Norm Vel (m/s)  Left Median Motor (Abd Poll Brev)  33C  Wrist    3.8 <4.0 7.5 >5 Elbow Wrist 5.0 26.0 52 >50  Elbow    8.8  7.3         Right Median Motor (Abd Poll Brev)   33C  Wrist    3.7 <4.0 7.6 >5 Elbow Wrist 4.6 23.0 50 >50  Elbow    8.3  7.1         Left Ulnar Motor (Abd Dig Minimi)  33C  Wrist    2.3 <3.1 8.4 >7 B Elbow Wrist 3.7 19.0 51 >50  B Elbow    6.0  7.1  A Elbow B Elbow 1.9 10.0 53 >50  A Elbow    7.9  6.5         Right Ulnar Motor (Abd Dig Minimi)  33C  Wrist    2.6 <3.1 9.6 >7 B Elbow Wrist 3.2 20.0 63 >50  B Elbow    5.8  9.0  A Elbow B Elbow 2.2 10.0 45 >50  A Elbow    8.0  8.7          Comparison Summary Table   Stim Site NR Peak (ms) Norm Peak (ms) P-T Amp (V) Site1 Site2 Delta-P (ms) Norm Delta (ms)  Left Median/Ulnar Palm Comparison (Wrist - 8cm)  33C  Median Palm    2.0 <2.2 19.1 Median Palm Ulnar Palm 0.3   Ulnar Palm    1.7 <2.2 7.2      Right Median/Ulnar Palm Comparison (Wrist - 8cm)  33C  Median Palm    1.9 <2.2 30.6 Median Palm Ulnar Palm 0.3  Ulnar Palm    1.6 <2.2 8.9       EMG   Side Muscle Ins Act Fibs Psw Fasc Number Recrt Dur Dur. Amp Amp. Poly Poly. Comment  Right 1stDorInt Nml Nml Nml Nml 1- Rapid Few 1+ Few 1+ Few 1+ N/A  Right ABD Dig Min Nml Nml Nml Nml 1- Rapid Some 1+ Some 1+ Some 1+ N/A  Right FlexCarpiUln Nml Nml Nml Nml 1- Rapid Some 1+ Some 1+ Some 1+ N/A  Right PronatorTeres Nml Nml Nml Nml Nml Nml Nml Nml Nml Nml Nml Nml N/A  Right Biceps Nml Nml Nml Nml Nml Nml Nml Nml Nml Nml Nml Nml N/A  Right Triceps Nml Nml Nml Nml Nml Nml Nml Nml Nml Nml Nml Nml N/A  Right Deltoid Nml Nml Nml Nml Nml Nml Nml Nml Nml Nml Nml Nml N/A  Left 1stDorInt Nml Nml Nml Nml Nml Nml Nml Nml Nml Nml Nml Nml N/A  Left PronatorTeres Nml Nml Nml Nml Nml Nml Nml Nml Nml Nml Nml Nml N/A  Left Biceps Nml Nml Nml Nml Nml Nml Nml Nml Nml Nml Nml Nml N/A  Left Triceps Nml Nml Nml Nml Nml Nml Nml Nml Nml Nml Nml Nml N/A  Left Deltoid Nml Nml Nml Nml Nml Nml Nml Nml Nml Nml Nml Nml N/A      Waveforms:

## 2020-11-04 NOTE — Patient Instructions (Signed)
Medication Instructions:  Your physician recommends that you continue on your current medications as directed. Please refer to the Current Medication list given to you today.  *If you need a refill on your cardiac medications before your next appointment, please call your pharmacy*  Lab Work: If you have labs (blood work) drawn today and your tests are completely normal, you will receive your results only by: MyChart Message (if you have MyChart) OR A paper copy in the mail If you have any lab test that is abnormal or we need to change your treatment, we will call you to review the results.  Testing/Procedures: Your physician has requested that you have an exercise tolerance test. For further information please visit [email or web address] Please also follow instruction sheet, as given.  Follow-Up: At [facility], you and your health needs are our priority.  As part of our continuing mission to provide you with exceptional heart care, we have created designated Provider Care Teams.  These Care Teams include your primary Cardiologist (physician) and Advanced Practice Providers (APPs -  Physician Assistants and Nurse Practitioners) who all work together to provide you with the care you need, when you need it.  We recommend signing up for the patient portal called "MyChart".  Sign up information is provided on this After Visit Summary.  MyChart is used to connect with patients for Virtual Visits (Telemedicine).  Patients are able to view lab/test results, encounter notes, upcoming appointments, etc.  Non-urgent messages can be sent to your provider as well.   To learn more about what you can do with MyChart, go to [email or web address].    Your next appointment:   1 year(s)  The format for your next appointment:   In Person  Provider:   You may see Charlton Haws, MD or one of the following Advanced Practice Providers on your designated Care Team:   Nada Boozer, NP

## 2020-11-05 ENCOUNTER — Other Ambulatory Visit (HOSPITAL_COMMUNITY): Payer: Self-pay

## 2020-11-09 DIAGNOSIS — E113312 Type 2 diabetes mellitus with moderate nonproliferative diabetic retinopathy with macular edema, left eye: Secondary | ICD-10-CM | POA: Diagnosis not present

## 2020-11-10 ENCOUNTER — Other Ambulatory Visit (HOSPITAL_COMMUNITY): Payer: Self-pay

## 2020-11-10 DIAGNOSIS — G5621 Lesion of ulnar nerve, right upper limb: Secondary | ICD-10-CM | POA: Diagnosis not present

## 2020-11-10 MED ORDER — PREGABALIN 50 MG PO CAPS
ORAL_CAPSULE | ORAL | 1 refills | Status: DC
  Filled 2020-11-10: qty 90, 30d supply, fill #0

## 2020-11-11 DIAGNOSIS — E113212 Type 2 diabetes mellitus with mild nonproliferative diabetic retinopathy with macular edema, left eye: Secondary | ICD-10-CM | POA: Diagnosis not present

## 2020-11-11 DIAGNOSIS — H524 Presbyopia: Secondary | ICD-10-CM | POA: Diagnosis not present

## 2020-11-11 DIAGNOSIS — H52203 Unspecified astigmatism, bilateral: Secondary | ICD-10-CM | POA: Diagnosis not present

## 2020-11-13 ENCOUNTER — Other Ambulatory Visit: Payer: Self-pay

## 2020-11-13 DIAGNOSIS — R079 Chest pain, unspecified: Secondary | ICD-10-CM

## 2020-11-13 NOTE — Progress Notes (Signed)
Will send to Dr. Eden Emms to sign.

## 2020-11-16 MED FILL — Losartan Potassium Tab 50 MG: ORAL | 90 days supply | Qty: 90 | Fill #1 | Status: CN

## 2020-11-17 ENCOUNTER — Other Ambulatory Visit: Payer: Self-pay

## 2020-11-17 ENCOUNTER — Ambulatory Visit (INDEPENDENT_AMBULATORY_CARE_PROVIDER_SITE_OTHER): Payer: 59

## 2020-11-17 ENCOUNTER — Other Ambulatory Visit (HOSPITAL_COMMUNITY): Payer: Self-pay

## 2020-11-17 DIAGNOSIS — I480 Paroxysmal atrial fibrillation: Secondary | ICD-10-CM | POA: Diagnosis not present

## 2020-11-17 LAB — EXERCISE TOLERANCE TEST
Angina Index: 0
Duke Treadmill Score: 3
Estimated workload: 2.5
Exercise duration (min): 3 min
Exercise duration (sec): 23 s
MPHR: 148 {beats}/min
Peak HR: 92 {beats}/min
Percent HR: 61 %
RPE: 15
Rest HR: 61 {beats}/min
ST Depression (mm): 0 mm

## 2020-11-18 ENCOUNTER — Other Ambulatory Visit (HOSPITAL_COMMUNITY): Payer: Self-pay

## 2020-11-18 MED ORDER — ATORVASTATIN CALCIUM 40 MG PO TABS
40.0000 mg | ORAL_TABLET | Freq: Every day | ORAL | 3 refills | Status: DC
  Filled 2020-11-18: qty 90, 90d supply, fill #0
  Filled 2021-02-22: qty 90, 90d supply, fill #1
  Filled 2021-06-10: qty 90, 90d supply, fill #2
  Filled 2021-09-05: qty 90, 90d supply, fill #3

## 2020-11-18 MED FILL — Losartan Potassium Tab 50 MG: ORAL | 90 days supply | Qty: 90 | Fill #1 | Status: AC

## 2020-12-03 ENCOUNTER — Other Ambulatory Visit (HOSPITAL_COMMUNITY): Payer: Self-pay

## 2020-12-03 MED FILL — Insulin Glargine-yfgn Soln Pen-Injector 100 Unit/ML: SUBCUTANEOUS | 28 days supply | Qty: 30 | Fill #6 | Status: AC

## 2020-12-05 ENCOUNTER — Other Ambulatory Visit (HOSPITAL_COMMUNITY): Payer: Self-pay

## 2020-12-05 MED ORDER — CARESTART COVID-19 HOME TEST VI KIT
PACK | 0 refills | Status: DC
  Filled 2020-12-05: qty 4, 4d supply, fill #0

## 2020-12-08 ENCOUNTER — Other Ambulatory Visit (HOSPITAL_COMMUNITY): Payer: Self-pay

## 2020-12-09 ENCOUNTER — Other Ambulatory Visit (HOSPITAL_COMMUNITY): Payer: Self-pay

## 2020-12-09 MED ORDER — TRULICITY 4.5 MG/0.5ML ~~LOC~~ SOAJ
SUBCUTANEOUS | 11 refills | Status: DC
  Filled 2020-12-09: qty 2, 28d supply, fill #0
  Filled 2021-01-13: qty 2, 28d supply, fill #1
  Filled 2021-02-08: qty 2, 28d supply, fill #2
  Filled 2021-03-17: qty 2, 28d supply, fill #3
  Filled 2021-04-12: qty 2, 28d supply, fill #4
  Filled 2021-05-11: qty 2, 28d supply, fill #5
  Filled 2021-06-04: qty 2, 28d supply, fill #6
  Filled 2021-07-21: qty 2, 28d supply, fill #7
  Filled 2021-08-13: qty 2, 28d supply, fill #8
  Filled 2021-09-22: qty 2, 28d supply, fill #9
  Filled 2021-10-20: qty 2, 28d supply, fill #10
  Filled 2021-11-18: qty 2, 28d supply, fill #11

## 2020-12-14 DIAGNOSIS — E113511 Type 2 diabetes mellitus with proliferative diabetic retinopathy with macular edema, right eye: Secondary | ICD-10-CM | POA: Diagnosis not present

## 2020-12-16 DIAGNOSIS — E113312 Type 2 diabetes mellitus with moderate nonproliferative diabetic retinopathy with macular edema, left eye: Secondary | ICD-10-CM | POA: Diagnosis not present

## 2020-12-17 ENCOUNTER — Other Ambulatory Visit (HOSPITAL_COMMUNITY): Payer: Self-pay

## 2020-12-17 MED ORDER — NEOMYCIN-POLYMYXIN-DEXAMETH 0.1 % OP OINT
TOPICAL_OINTMENT | OPHTHALMIC | 1 refills | Status: DC
  Filled 2020-12-17: qty 3.5, 7d supply, fill #0

## 2020-12-21 DIAGNOSIS — M48062 Spinal stenosis, lumbar region with neurogenic claudication: Secondary | ICD-10-CM | POA: Diagnosis not present

## 2020-12-22 ENCOUNTER — Other Ambulatory Visit (HOSPITAL_COMMUNITY): Payer: Self-pay

## 2020-12-22 DIAGNOSIS — G5621 Lesion of ulnar nerve, right upper limb: Secondary | ICD-10-CM | POA: Diagnosis not present

## 2020-12-22 DIAGNOSIS — M654 Radial styloid tenosynovitis [de Quervain]: Secondary | ICD-10-CM | POA: Diagnosis not present

## 2020-12-22 DIAGNOSIS — M25532 Pain in left wrist: Secondary | ICD-10-CM | POA: Diagnosis not present

## 2020-12-22 DIAGNOSIS — M24131 Other articular cartilage disorders, right wrist: Secondary | ICD-10-CM | POA: Diagnosis not present

## 2020-12-22 DIAGNOSIS — M25531 Pain in right wrist: Secondary | ICD-10-CM | POA: Diagnosis not present

## 2020-12-22 MED ORDER — PREGABALIN 75 MG PO CAPS
ORAL_CAPSULE | ORAL | 1 refills | Status: DC
  Filled 2020-12-22: qty 90, 30d supply, fill #0
  Filled 2021-01-30: qty 90, 30d supply, fill #1

## 2020-12-23 ENCOUNTER — Other Ambulatory Visit (HOSPITAL_COMMUNITY): Payer: Self-pay

## 2020-12-23 ENCOUNTER — Other Ambulatory Visit: Payer: Self-pay | Admitting: Cardiovascular Disease

## 2020-12-23 MED ORDER — METOPROLOL TARTRATE 50 MG PO TABS
50.0000 mg | ORAL_TABLET | Freq: Two times a day (BID) | ORAL | 3 refills | Status: DC
  Filled 2020-12-23: qty 180, 90d supply, fill #0
  Filled 2021-03-27: qty 180, 90d supply, fill #1
  Filled 2021-07-08: qty 180, 90d supply, fill #2
  Filled 2021-09-22: qty 180, 90d supply, fill #3

## 2020-12-23 MED FILL — Hydrochlorothiazide Cap 12.5 MG: ORAL | 90 days supply | Qty: 90 | Fill #1 | Status: AC

## 2020-12-23 MED FILL — Hydralazine HCl Tab 25 MG: ORAL | 90 days supply | Qty: 180 | Fill #1 | Status: AC

## 2021-01-01 ENCOUNTER — Other Ambulatory Visit (HOSPITAL_COMMUNITY): Payer: Self-pay

## 2021-01-01 ENCOUNTER — Other Ambulatory Visit: Payer: Self-pay | Admitting: Cardiovascular Disease

## 2021-01-01 DIAGNOSIS — I480 Paroxysmal atrial fibrillation: Secondary | ICD-10-CM

## 2021-01-01 MED ORDER — RIVAROXABAN 20 MG PO TABS
20.0000 mg | ORAL_TABLET | Freq: Every day | ORAL | 0 refills | Status: DC
  Filled 2021-01-01: qty 90, 90d supply, fill #0

## 2021-01-01 MED ORDER — FLECAINIDE ACETATE 50 MG PO TABS
50.0000 mg | ORAL_TABLET | Freq: Two times a day (BID) | ORAL | 0 refills | Status: DC
  Filled 2021-01-01: qty 180, 90d supply, fill #0

## 2021-01-01 MED FILL — Dapagliflozin Prop-Metformin HCl Tab ER 24HR 10-1000 MG: ORAL | 90 days supply | Qty: 90 | Fill #1 | Status: AC

## 2021-01-01 MED FILL — Insulin Glargine-yfgn Soln Pen-Injector 100 Unit/ML: SUBCUTANEOUS | 28 days supply | Qty: 30 | Fill #7 | Status: AC

## 2021-01-01 NOTE — Telephone Encounter (Signed)
Prescription refill request for Xarelto received.  Indication: Last office visit: 11/04/20 Weight: 248 Age: 70 Scr: 1.31 CrCl: 70 mL/min

## 2021-01-04 ENCOUNTER — Other Ambulatory Visit (HOSPITAL_COMMUNITY): Payer: Self-pay

## 2021-01-05 ENCOUNTER — Other Ambulatory Visit (HOSPITAL_COMMUNITY): Payer: Self-pay

## 2021-01-05 MED ORDER — OMEPRAZOLE 20 MG PO CPDR
20.0000 mg | DELAYED_RELEASE_CAPSULE | Freq: Every day | ORAL | 3 refills | Status: DC
  Filled 2021-01-05: qty 90, 90d supply, fill #0
  Filled 2021-04-27: qty 90, 90d supply, fill #1
  Filled 2021-07-21: qty 90, 90d supply, fill #2
  Filled 2021-11-03: qty 90, 90d supply, fill #3

## 2021-01-13 ENCOUNTER — Other Ambulatory Visit (HOSPITAL_COMMUNITY): Payer: Self-pay

## 2021-01-14 DIAGNOSIS — M48062 Spinal stenosis, lumbar region with neurogenic claudication: Secondary | ICD-10-CM | POA: Diagnosis not present

## 2021-01-21 DIAGNOSIS — E113511 Type 2 diabetes mellitus with proliferative diabetic retinopathy with macular edema, right eye: Secondary | ICD-10-CM | POA: Diagnosis not present

## 2021-01-25 DIAGNOSIS — E113312 Type 2 diabetes mellitus with moderate nonproliferative diabetic retinopathy with macular edema, left eye: Secondary | ICD-10-CM | POA: Diagnosis not present

## 2021-01-29 ENCOUNTER — Other Ambulatory Visit (HOSPITAL_COMMUNITY): Payer: Self-pay

## 2021-01-30 ENCOUNTER — Other Ambulatory Visit (HOSPITAL_COMMUNITY): Payer: Self-pay

## 2021-01-30 MED FILL — Insulin Glargine-yfgn Soln Pen-Injector 100 Unit/ML: SUBCUTANEOUS | 28 days supply | Qty: 30 | Fill #8 | Status: AC

## 2021-02-05 ENCOUNTER — Other Ambulatory Visit (HOSPITAL_COMMUNITY): Payer: Self-pay

## 2021-02-08 ENCOUNTER — Other Ambulatory Visit (HOSPITAL_COMMUNITY): Payer: Self-pay

## 2021-02-08 DIAGNOSIS — M48062 Spinal stenosis, lumbar region with neurogenic claudication: Secondary | ICD-10-CM | POA: Diagnosis not present

## 2021-02-08 MED ORDER — GLUCOSE BLOOD VI STRP
ORAL_STRIP | 4 refills | Status: DC
  Filled 2021-02-08: qty 300, 90d supply, fill #0
  Filled 2021-10-28: qty 300, 90d supply, fill #1

## 2021-02-09 ENCOUNTER — Other Ambulatory Visit (HOSPITAL_COMMUNITY): Payer: Self-pay

## 2021-02-17 DIAGNOSIS — H43812 Vitreous degeneration, left eye: Secondary | ICD-10-CM | POA: Diagnosis not present

## 2021-02-17 DIAGNOSIS — E113512 Type 2 diabetes mellitus with proliferative diabetic retinopathy with macular edema, left eye: Secondary | ICD-10-CM | POA: Diagnosis not present

## 2021-02-17 DIAGNOSIS — H31092 Other chorioretinal scars, left eye: Secondary | ICD-10-CM | POA: Diagnosis not present

## 2021-02-17 DIAGNOSIS — H35342 Macular cyst, hole, or pseudohole, left eye: Secondary | ICD-10-CM | POA: Diagnosis not present

## 2021-02-17 DIAGNOSIS — H3582 Retinal ischemia: Secondary | ICD-10-CM | POA: Diagnosis not present

## 2021-02-22 ENCOUNTER — Other Ambulatory Visit (HOSPITAL_COMMUNITY): Payer: Self-pay

## 2021-02-22 MED FILL — Insulin Glargine-yfgn Soln Pen-Injector 100 Unit/ML: SUBCUTANEOUS | 28 days supply | Qty: 30 | Fill #9 | Status: AC

## 2021-02-22 MED FILL — Losartan Potassium Tab 50 MG: ORAL | 90 days supply | Qty: 90 | Fill #2 | Status: AC

## 2021-02-23 DIAGNOSIS — M25531 Pain in right wrist: Secondary | ICD-10-CM | POA: Diagnosis not present

## 2021-02-23 DIAGNOSIS — M654 Radial styloid tenosynovitis [de Quervain]: Secondary | ICD-10-CM | POA: Diagnosis not present

## 2021-02-23 DIAGNOSIS — M24131 Other articular cartilage disorders, right wrist: Secondary | ICD-10-CM | POA: Diagnosis not present

## 2021-02-23 DIAGNOSIS — M25532 Pain in left wrist: Secondary | ICD-10-CM | POA: Diagnosis not present

## 2021-02-23 DIAGNOSIS — G5621 Lesion of ulnar nerve, right upper limb: Secondary | ICD-10-CM | POA: Diagnosis not present

## 2021-02-24 DIAGNOSIS — E113511 Type 2 diabetes mellitus with proliferative diabetic retinopathy with macular edema, right eye: Secondary | ICD-10-CM | POA: Diagnosis not present

## 2021-02-25 DIAGNOSIS — Z87442 Personal history of urinary calculi: Secondary | ICD-10-CM | POA: Diagnosis not present

## 2021-02-25 DIAGNOSIS — E1165 Type 2 diabetes mellitus with hyperglycemia: Secondary | ICD-10-CM | POA: Diagnosis not present

## 2021-02-25 DIAGNOSIS — M85851 Other specified disorders of bone density and structure, right thigh: Secondary | ICD-10-CM | POA: Diagnosis not present

## 2021-02-25 DIAGNOSIS — E039 Hypothyroidism, unspecified: Secondary | ICD-10-CM | POA: Diagnosis not present

## 2021-02-25 DIAGNOSIS — Z794 Long term (current) use of insulin: Secondary | ICD-10-CM | POA: Diagnosis not present

## 2021-03-01 DIAGNOSIS — E113312 Type 2 diabetes mellitus with moderate nonproliferative diabetic retinopathy with macular edema, left eye: Secondary | ICD-10-CM | POA: Diagnosis not present

## 2021-03-02 ENCOUNTER — Other Ambulatory Visit (HOSPITAL_COMMUNITY): Payer: Self-pay

## 2021-03-03 ENCOUNTER — Other Ambulatory Visit (HOSPITAL_COMMUNITY): Payer: Self-pay

## 2021-03-03 DIAGNOSIS — H11432 Conjunctival hyperemia, left eye: Secondary | ICD-10-CM | POA: Diagnosis not present

## 2021-03-03 DIAGNOSIS — H5319 Other subjective visual disturbances: Secondary | ICD-10-CM | POA: Diagnosis not present

## 2021-03-03 DIAGNOSIS — S0502XA Injury of conjunctiva and corneal abrasion without foreign body, left eye, initial encounter: Secondary | ICD-10-CM | POA: Diagnosis not present

## 2021-03-03 DIAGNOSIS — H5712 Ocular pain, left eye: Secondary | ICD-10-CM | POA: Diagnosis not present

## 2021-03-03 MED ORDER — PREGABALIN 75 MG PO CAPS
ORAL_CAPSULE | ORAL | 1 refills | Status: DC
  Filled 2021-03-03: qty 90, 30d supply, fill #0

## 2021-03-03 MED ORDER — OFLOXACIN 0.3 % OP SOLN
OPHTHALMIC | 1 refills | Status: DC
  Filled 2021-03-03: qty 5, 7d supply, fill #0

## 2021-03-05 DIAGNOSIS — H11432 Conjunctival hyperemia, left eye: Secondary | ICD-10-CM | POA: Diagnosis not present

## 2021-03-05 DIAGNOSIS — S0502XA Injury of conjunctiva and corneal abrasion without foreign body, left eye, initial encounter: Secondary | ICD-10-CM | POA: Diagnosis not present

## 2021-03-09 ENCOUNTER — Other Ambulatory Visit (HOSPITAL_COMMUNITY): Payer: Self-pay

## 2021-03-09 MED ORDER — LEVOTHYROXINE SODIUM 137 MCG PO TABS
ORAL_TABLET | ORAL | 3 refills | Status: DC
  Filled 2021-03-09: qty 90, 90d supply, fill #0
  Filled 2021-07-08: qty 90, 90d supply, fill #1
  Filled 2021-10-04: qty 90, 90d supply, fill #2
  Filled 2022-01-02: qty 90, 90d supply, fill #3

## 2021-03-17 MED FILL — Insulin Glargine-yfgn Soln Pen-Injector 100 Unit/ML: SUBCUTANEOUS | 28 days supply | Qty: 30 | Fill #10 | Status: AC

## 2021-03-18 ENCOUNTER — Other Ambulatory Visit (HOSPITAL_COMMUNITY): Payer: Self-pay

## 2021-03-27 ENCOUNTER — Other Ambulatory Visit (HOSPITAL_COMMUNITY): Payer: Self-pay

## 2021-03-27 ENCOUNTER — Other Ambulatory Visit: Payer: Self-pay | Admitting: Cardiovascular Disease

## 2021-03-27 DIAGNOSIS — I480 Paroxysmal atrial fibrillation: Secondary | ICD-10-CM

## 2021-03-29 ENCOUNTER — Other Ambulatory Visit (HOSPITAL_COMMUNITY): Payer: Self-pay

## 2021-03-30 ENCOUNTER — Other Ambulatory Visit (HOSPITAL_COMMUNITY): Payer: Self-pay

## 2021-03-30 MED ORDER — HYDRALAZINE HCL 25 MG PO TABS
ORAL_TABLET | ORAL | 3 refills | Status: DC
  Filled 2021-03-30: qty 180, 90d supply, fill #0
  Filled 2021-07-08: qty 180, 90d supply, fill #1
  Filled 2021-10-04: qty 180, 90d supply, fill #2
  Filled 2022-01-02: qty 180, 90d supply, fill #3

## 2021-03-30 MED ORDER — FLECAINIDE ACETATE 50 MG PO TABS
50.0000 mg | ORAL_TABLET | Freq: Two times a day (BID) | ORAL | 2 refills | Status: DC
  Filled 2021-03-30: qty 180, 90d supply, fill #0
  Filled 2021-07-21: qty 180, 90d supply, fill #1

## 2021-03-30 MED ORDER — RIVAROXABAN 20 MG PO TABS
20.0000 mg | ORAL_TABLET | Freq: Every day | ORAL | 1 refills | Status: DC
  Filled 2021-03-30: qty 90, 90d supply, fill #0
  Filled 2021-07-08: qty 90, 90d supply, fill #1

## 2021-03-30 NOTE — Telephone Encounter (Signed)
Prescription refill request for Xarelto received.  Indication: afib  Last office visit: 11/04/2020 Weight:112.5 kg  Age: 71 yo  Scr: 1.26, 02/25/2021 CrCl: 71 ml/min   Refill sent.

## 2021-03-31 ENCOUNTER — Other Ambulatory Visit: Payer: Self-pay | Admitting: Internal Medicine

## 2021-03-31 DIAGNOSIS — M8580 Other specified disorders of bone density and structure, unspecified site: Secondary | ICD-10-CM

## 2021-04-05 DIAGNOSIS — H3582 Retinal ischemia: Secondary | ICD-10-CM | POA: Diagnosis not present

## 2021-04-05 DIAGNOSIS — H31092 Other chorioretinal scars, left eye: Secondary | ICD-10-CM | POA: Diagnosis not present

## 2021-04-05 DIAGNOSIS — E113511 Type 2 diabetes mellitus with proliferative diabetic retinopathy with macular edema, right eye: Secondary | ICD-10-CM | POA: Diagnosis not present

## 2021-04-05 DIAGNOSIS — E113312 Type 2 diabetes mellitus with moderate nonproliferative diabetic retinopathy with macular edema, left eye: Secondary | ICD-10-CM | POA: Diagnosis not present

## 2021-04-05 DIAGNOSIS — H26493 Other secondary cataract, bilateral: Secondary | ICD-10-CM | POA: Diagnosis not present

## 2021-04-05 DIAGNOSIS — Z961 Presence of intraocular lens: Secondary | ICD-10-CM | POA: Diagnosis not present

## 2021-04-06 DIAGNOSIS — M654 Radial styloid tenosynovitis [de Quervain]: Secondary | ICD-10-CM | POA: Diagnosis not present

## 2021-04-06 DIAGNOSIS — G5621 Lesion of ulnar nerve, right upper limb: Secondary | ICD-10-CM | POA: Diagnosis not present

## 2021-04-06 DIAGNOSIS — M25531 Pain in right wrist: Secondary | ICD-10-CM | POA: Diagnosis not present

## 2021-04-06 DIAGNOSIS — M24131 Other articular cartilage disorders, right wrist: Secondary | ICD-10-CM | POA: Diagnosis not present

## 2021-04-06 DIAGNOSIS — S66302A Unspecified injury of extensor muscle, fascia and tendon of right middle finger at wrist and hand level, initial encounter: Secondary | ICD-10-CM | POA: Diagnosis not present

## 2021-04-06 DIAGNOSIS — M79642 Pain in left hand: Secondary | ICD-10-CM | POA: Diagnosis not present

## 2021-04-06 DIAGNOSIS — M25642 Stiffness of left hand, not elsewhere classified: Secondary | ICD-10-CM | POA: Diagnosis not present

## 2021-04-07 DIAGNOSIS — E113312 Type 2 diabetes mellitus with moderate nonproliferative diabetic retinopathy with macular edema, left eye: Secondary | ICD-10-CM | POA: Diagnosis not present

## 2021-04-12 ENCOUNTER — Other Ambulatory Visit (HOSPITAL_COMMUNITY): Payer: Self-pay

## 2021-04-12 MED ORDER — XIGDUO XR 10-1000 MG PO TB24
ORAL_TABLET | ORAL | 3 refills | Status: DC
  Filled 2021-04-12: qty 90, 90d supply, fill #0
  Filled 2021-07-21: qty 90, 90d supply, fill #1
  Filled 2021-10-04: qty 90, 90d supply, fill #2
  Filled 2022-01-03: qty 90, 90d supply, fill #3

## 2021-04-12 MED ORDER — HYDROCHLOROTHIAZIDE 12.5 MG PO CAPS
ORAL_CAPSULE | ORAL | 3 refills | Status: DC
  Filled 2021-04-12: qty 30, 30d supply, fill #0
  Filled 2021-05-17: qty 30, 30d supply, fill #1

## 2021-04-12 MED FILL — Insulin Glargine-yfgn Soln Pen-Injector 100 Unit/ML: SUBCUTANEOUS | 28 days supply | Qty: 30 | Fill #11 | Status: AC

## 2021-04-13 ENCOUNTER — Other Ambulatory Visit (HOSPITAL_COMMUNITY): Payer: Self-pay

## 2021-04-21 DIAGNOSIS — E113511 Type 2 diabetes mellitus with proliferative diabetic retinopathy with macular edema, right eye: Secondary | ICD-10-CM | POA: Diagnosis not present

## 2021-04-22 DIAGNOSIS — H26492 Other secondary cataract, left eye: Secondary | ICD-10-CM | POA: Diagnosis not present

## 2021-04-26 ENCOUNTER — Other Ambulatory Visit (HOSPITAL_COMMUNITY): Payer: Self-pay

## 2021-04-26 MED ORDER — PREGABALIN 75 MG PO CAPS
ORAL_CAPSULE | ORAL | 1 refills | Status: DC
  Filled 2021-04-26: qty 90, 30d supply, fill #0

## 2021-04-28 ENCOUNTER — Other Ambulatory Visit (HOSPITAL_COMMUNITY): Payer: Self-pay

## 2021-05-03 DIAGNOSIS — E113511 Type 2 diabetes mellitus with proliferative diabetic retinopathy with macular edema, right eye: Secondary | ICD-10-CM | POA: Diagnosis not present

## 2021-05-04 DIAGNOSIS — Z0001 Encounter for general adult medical examination with abnormal findings: Secondary | ICD-10-CM | POA: Diagnosis not present

## 2021-05-04 DIAGNOSIS — E1165 Type 2 diabetes mellitus with hyperglycemia: Secondary | ICD-10-CM | POA: Diagnosis not present

## 2021-05-04 DIAGNOSIS — E113213 Type 2 diabetes mellitus with mild nonproliferative diabetic retinopathy with macular edema, bilateral: Secondary | ICD-10-CM | POA: Diagnosis not present

## 2021-05-04 DIAGNOSIS — F322 Major depressive disorder, single episode, severe without psychotic features: Secondary | ICD-10-CM | POA: Diagnosis not present

## 2021-05-04 DIAGNOSIS — G4733 Obstructive sleep apnea (adult) (pediatric): Secondary | ICD-10-CM | POA: Diagnosis not present

## 2021-05-04 DIAGNOSIS — I4891 Unspecified atrial fibrillation: Secondary | ICD-10-CM | POA: Diagnosis not present

## 2021-05-04 DIAGNOSIS — D6869 Other thrombophilia: Secondary | ICD-10-CM | POA: Diagnosis not present

## 2021-05-04 DIAGNOSIS — E039 Hypothyroidism, unspecified: Secondary | ICD-10-CM | POA: Diagnosis not present

## 2021-05-04 DIAGNOSIS — E7800 Pure hypercholesterolemia, unspecified: Secondary | ICD-10-CM | POA: Diagnosis not present

## 2021-05-11 ENCOUNTER — Other Ambulatory Visit (HOSPITAL_COMMUNITY): Payer: Self-pay

## 2021-05-11 MED FILL — Insulin Glargine-yfgn Soln Pen-Injector 100 Unit/ML: SUBCUTANEOUS | 28 days supply | Qty: 30 | Fill #12 | Status: AC

## 2021-05-13 DIAGNOSIS — M79642 Pain in left hand: Secondary | ICD-10-CM | POA: Diagnosis not present

## 2021-05-13 DIAGNOSIS — G5621 Lesion of ulnar nerve, right upper limb: Secondary | ICD-10-CM | POA: Diagnosis not present

## 2021-05-13 DIAGNOSIS — M654 Radial styloid tenosynovitis [de Quervain]: Secondary | ICD-10-CM | POA: Diagnosis not present

## 2021-05-13 DIAGNOSIS — M24131 Other articular cartilage disorders, right wrist: Secondary | ICD-10-CM | POA: Diagnosis not present

## 2021-05-13 DIAGNOSIS — M25531 Pain in right wrist: Secondary | ICD-10-CM | POA: Diagnosis not present

## 2021-05-13 DIAGNOSIS — M25532 Pain in left wrist: Secondary | ICD-10-CM | POA: Diagnosis not present

## 2021-05-17 ENCOUNTER — Other Ambulatory Visit (HOSPITAL_COMMUNITY): Payer: Self-pay

## 2021-05-17 DIAGNOSIS — E113512 Type 2 diabetes mellitus with proliferative diabetic retinopathy with macular edema, left eye: Secondary | ICD-10-CM | POA: Diagnosis not present

## 2021-05-17 DIAGNOSIS — M25571 Pain in right ankle and joints of right foot: Secondary | ICD-10-CM | POA: Diagnosis not present

## 2021-05-17 MED FILL — Losartan Potassium Tab 50 MG: ORAL | 90 days supply | Qty: 90 | Fill #3 | Status: AC

## 2021-05-18 ENCOUNTER — Other Ambulatory Visit (HOSPITAL_COMMUNITY): Payer: Self-pay

## 2021-05-18 MED ORDER — DULOXETINE HCL 60 MG PO CPEP
60.0000 mg | ORAL_CAPSULE | Freq: Every day | ORAL | 3 refills | Status: DC
  Filled 2021-05-18: qty 90, 90d supply, fill #0
  Filled 2021-08-24: qty 90, 90d supply, fill #1
  Filled 2021-11-18: qty 90, 90d supply, fill #2
  Filled 2022-02-14: qty 90, 90d supply, fill #3

## 2021-05-20 ENCOUNTER — Other Ambulatory Visit (HOSPITAL_COMMUNITY): Payer: Self-pay

## 2021-05-20 MED ORDER — UNIFINE PENTIPS 32G X 4 MM MISC
3 refills | Status: DC
  Filled 2021-05-20: qty 200, 90d supply, fill #0
  Filled 2021-11-18: qty 200, 90d supply, fill #1
  Filled 2022-03-16: qty 200, 90d supply, fill #2

## 2021-05-24 ENCOUNTER — Other Ambulatory Visit (HOSPITAL_COMMUNITY): Payer: Self-pay

## 2021-05-24 DIAGNOSIS — M109 Gout, unspecified: Secondary | ICD-10-CM | POA: Diagnosis not present

## 2021-05-24 MED ORDER — PREDNISONE 10 MG PO TABS
ORAL_TABLET | ORAL | 0 refills | Status: DC
  Filled 2021-05-24: qty 40, 16d supply, fill #0

## 2021-05-27 DIAGNOSIS — H26491 Other secondary cataract, right eye: Secondary | ICD-10-CM | POA: Diagnosis not present

## 2021-05-28 ENCOUNTER — Other Ambulatory Visit (HOSPITAL_COMMUNITY): Payer: Self-pay

## 2021-05-28 DIAGNOSIS — M85851 Other specified disorders of bone density and structure, right thigh: Secondary | ICD-10-CM | POA: Diagnosis not present

## 2021-05-28 DIAGNOSIS — Z87442 Personal history of urinary calculi: Secondary | ICD-10-CM | POA: Diagnosis not present

## 2021-05-28 DIAGNOSIS — Z794 Long term (current) use of insulin: Secondary | ICD-10-CM | POA: Diagnosis not present

## 2021-05-28 DIAGNOSIS — E1165 Type 2 diabetes mellitus with hyperglycemia: Secondary | ICD-10-CM | POA: Diagnosis not present

## 2021-05-28 DIAGNOSIS — M109 Gout, unspecified: Secondary | ICD-10-CM | POA: Diagnosis not present

## 2021-05-28 DIAGNOSIS — E039 Hypothyroidism, unspecified: Secondary | ICD-10-CM | POA: Diagnosis not present

## 2021-05-28 MED ORDER — AMLODIPINE BESYLATE 10 MG PO TABS
10.0000 mg | ORAL_TABLET | Freq: Every day | ORAL | 4 refills | Status: DC
  Filled 2021-05-28: qty 90, 90d supply, fill #0

## 2021-05-31 DIAGNOSIS — E113312 Type 2 diabetes mellitus with moderate nonproliferative diabetic retinopathy with macular edema, left eye: Secondary | ICD-10-CM | POA: Diagnosis not present

## 2021-06-04 ENCOUNTER — Other Ambulatory Visit (HOSPITAL_COMMUNITY): Payer: Self-pay

## 2021-06-04 MED ORDER — INSULIN GLARGINE-YFGN 100 UNIT/ML ~~LOC~~ SOPN
PEN_INJECTOR | SUBCUTANEOUS | 3 refills | Status: DC
  Filled 2021-06-04: qty 120, 85d supply, fill #0
  Filled 2021-06-08: qty 84, 78d supply, fill #0
  Filled 2021-06-08: qty 6, 6d supply, fill #0
  Filled 2021-08-12: qty 90, 84d supply, fill #1
  Filled 2021-10-28: qty 90, 84d supply, fill #2

## 2021-06-07 DIAGNOSIS — E113511 Type 2 diabetes mellitus with proliferative diabetic retinopathy with macular edema, right eye: Secondary | ICD-10-CM | POA: Diagnosis not present

## 2021-06-08 ENCOUNTER — Other Ambulatory Visit (HOSPITAL_COMMUNITY): Payer: Self-pay

## 2021-06-09 ENCOUNTER — Other Ambulatory Visit (HOSPITAL_COMMUNITY): Payer: Self-pay

## 2021-06-10 ENCOUNTER — Other Ambulatory Visit (HOSPITAL_COMMUNITY): Payer: Self-pay

## 2021-06-16 DIAGNOSIS — M25572 Pain in left ankle and joints of left foot: Secondary | ICD-10-CM | POA: Diagnosis not present

## 2021-06-16 DIAGNOSIS — M25571 Pain in right ankle and joints of right foot: Secondary | ICD-10-CM | POA: Diagnosis not present

## 2021-06-17 DIAGNOSIS — M654 Radial styloid tenosynovitis [de Quervain]: Secondary | ICD-10-CM | POA: Diagnosis not present

## 2021-06-17 DIAGNOSIS — M24131 Other articular cartilage disorders, right wrist: Secondary | ICD-10-CM | POA: Diagnosis not present

## 2021-06-17 DIAGNOSIS — M25531 Pain in right wrist: Secondary | ICD-10-CM | POA: Diagnosis not present

## 2021-06-17 DIAGNOSIS — M79642 Pain in left hand: Secondary | ICD-10-CM | POA: Diagnosis not present

## 2021-06-17 DIAGNOSIS — G5621 Lesion of ulnar nerve, right upper limb: Secondary | ICD-10-CM | POA: Diagnosis not present

## 2021-06-18 ENCOUNTER — Other Ambulatory Visit (HOSPITAL_COMMUNITY): Payer: Self-pay

## 2021-06-21 ENCOUNTER — Other Ambulatory Visit: Payer: Self-pay | Admitting: Orthopedic Surgery

## 2021-06-21 DIAGNOSIS — M79642 Pain in left hand: Secondary | ICD-10-CM

## 2021-06-22 DIAGNOSIS — Z760 Encounter for issue of repeat prescription: Secondary | ICD-10-CM | POA: Diagnosis not present

## 2021-06-28 DIAGNOSIS — G4733 Obstructive sleep apnea (adult) (pediatric): Secondary | ICD-10-CM | POA: Diagnosis not present

## 2021-06-29 ENCOUNTER — Ambulatory Visit
Admission: RE | Admit: 2021-06-29 | Discharge: 2021-06-29 | Disposition: A | Discharge status: Home / Self Care | Payer: 59 | Source: Ambulatory Visit | Attending: Orthopedic Surgery | Admitting: Orthopedic Surgery

## 2021-06-29 DIAGNOSIS — R600 Localized edema: Secondary | ICD-10-CM | POA: Diagnosis not present

## 2021-06-29 DIAGNOSIS — M79642 Pain in left hand: Secondary | ICD-10-CM

## 2021-07-05 DIAGNOSIS — E113312 Type 2 diabetes mellitus with moderate nonproliferative diabetic retinopathy with macular edema, left eye: Secondary | ICD-10-CM | POA: Diagnosis not present

## 2021-07-05 NOTE — Progress Notes (Signed)
? ?Cardiology Office Note   ? ?Date:  07/07/2021  ? ?ID:  [name], DOB 1950-07-05, MRN [id number] ? ? ?PCP:  Marden NobleGates, Robert, MD ?  ? [facility]  ?Cardiologist:  Charlton HawsPeter Nishan, MD   ?Advanced Practice Provider:  No care team member to display ?Electrophysiologist:  None  ? ?[id number]}  ? ?No chief complaint on file. ? ? ?History of Present Illness:  ?[name] is a 71 y.o. female with history of PAF on flecainide Xarelto and beta-blocker, hypertension, HLD, obesity.  Patient saw Dr. Eden EmmsNishan 11/04/2020 and he ordered GXT to rule out proarrhythmia.  She had poor exercise tolerance and did not meet target heart rate as well as hypertensive response to exercise.  No stress-induced arrhythmias or ischemic changes.  Dr. Eden EmmsNishan recommended an exercise program. ? ?Patient comes in for f/u. PCP stopped her HCTZ and increased her amlodipine 10 mg daily and now is having swelling and shortness of breath.She's not sure why it was stopped. BP wasn't high and labs not checked.  She had gout after stopping and was put on prednisone. She felt like she had an episode of Afib on prednisone. Still having occasion heart fluttering and DOE with little activity and chest feels heavy. HR goes fast when walking.  ? ? ? ?Past Medical History:  ?Diagnosis Date  ? A-fib (HCC)   ? Anemia   ? hx of  ? Arthritis   ? Chest pain   ? Constipation   ? Depression   ? Diabetes mellitus ORAL MEDS  ? Diabetic retinopathy (HCC)   ? Dyspnea   ? with minimal exertion-deconditioned  ? Dyspnea   ? Dysrhythmia   ? a-fib  ? Food allergy   ? GERD (gastroesophageal reflux disease)   ? occasional  ? Gout   ? History of kidney stones   ? Hypercholesteremia   ? Hyperlipidemia   ? Hypertension   ? Hypertensive kidney disease   ? Hypothyroidism   ? Knee pain, right   ? Left arm numbness DUE TO CERVICAL PINCHED NERVE  ? Obesity   ? OSA on CPAP   ? uses CPAP nightly  ? Osteoarthritis   ? Palpitations   ? Pinched nerve in neck   ? Pleurisy    ? Pneumonia   ? hx of  ? PONV (postoperative nausea and vomiting)   ? for 3-4 days after general anesthesia approx 10-12 years ago  ? Sciatica   ? Swelling of knee joint, right   ? Synovitis of knee RIGHT  ? Trigger finger, left   ? left index  ? Vitamin D deficiency   ? ? ?Past Surgical History:  ?Procedure Laterality Date  ? CESAREAN SECTION  X3  ? KNEE ARTHROSCOPY  04/20/2011  ? Procedure: ARTHROSCOPY KNEE;  Surgeon: Loanne DrillingFrank V Aluisio, MD;  [facility] [place];  Service: Orthopedics;  Laterality: Right;  WITH SYNOVECTOMY  ? LEFT CARPAL TUNNEL / LEFT MIDDLE & RING FINGER TRIGGER RELEASE  08-26-2008  ? LEFT SHOULDER ARTHROSCOPY W/ DEBRIDEMENT  09-09-2003  ? LEFT SHOULDER ARTHROSCOPY/ LEFT THUMB TRIGGER RELEASE  02-22-2005  ? PHOTOCOAGULATION WITH LASER Right 03/20/2018  ? Procedure: PHOTOCOAGULATION WITH LASER;  Surgeon: Carmela RimaPatel, Narendra, MD;  [facility];  Service: Ophthalmology;  Laterality: Right;  ? PULLEY RELEASE LEFT LONG FINGER  07-14-2009  ? RADIAL HEAD ARTHROPLASTY Right 06/17/2018  ? Procedure: RADIAL HEAD ARTHROPLASTY;  Surgeon: Bjorn PippinVarkey, Dax T, MD;  [facility];  Service: Orthopedics;  Laterality: Right;  ? RIGHT CARPAL TUNNEL/ RIGHT THUMB TRIGGER RELEASE'S  11-28-2006  ? RIGHT SHOULDER ARTHROSCOPY W/ ROTATOR CUFF REPAIR  01-13-2004  ? SHOULDER ARTHROSCOPY DISTAL CLAVICLE EXCISION AND OPEN ROTATOR CUFF REPAIR  09-07-2004  ? LEFT  ? SHOULDER ARTHROSCOPY W/ ACROMIAL REPAIR  11-29-2005  ? LEFT  ? TONSILLECTOMY AND ADENOIDECTOMY  child  ? TOTAL KNEE ARTHROPLASTY  12-14-2009  ? RIGHT  ? TOTAL KNEE ARTHROPLASTY Left 11/05/2012  ? Procedure: LEFT TOTAL KNEE ARTHROPLASTY;  Surgeon: Loanne Drilling, MD;  Location: [facility];  Service: Orthopedics;  Laterality: Left;  ? TOTAL KNEE REVISION Right 07/29/2016  ? Procedure: RIGHT KNEE POLY-LINER EXCHANGE;  Surgeon: Kathryne Hitch, MD;  Location: [facility];  Service: Orthopedics;  Laterality: Right;  ? TRIGGER FINGER RELEASE Right 02/21/2013  ?  Procedure: RIGHT RING A-1 PULLEY RELEASE    (MINOR PROCEDURE) ;  Surgeon: Wyn Forster., MD;  Location[facility];  Service: Orthopedics;  Laterality: Right;  ? TRIGGER FINGER RELEASE Left 09/22/2016  ? Procedure: RELEASE TRIGGER FINGER LEFT INDEX FINGER;  Surgeon: Kathryne Hitch, MD;  Location: [facility] OR;  Service: Orthopedics;  Laterality: Left;  ? TRIGGER FINGER RELEASE Right 01/20/2017  ? Procedure: RELEASE TRIGGER FINGER/A-1 PULLEY RIGHT INDEX FINGER;  Surgeon: Marcene Corning, MD;  Location: [facility];  Service: Orthopedics;  Laterality: Right;  ? VITRECTOMY 25 GAUGE WITH SCLERAL BUCKLE Right 03/20/2018  ? Procedure: RIGHT EYE VITRECTOMY WITH  ENDOLASER PARENTAL PHOTOCOAGULATION 25 GAUGE;  Surgeon: Carmela Rima, MD;  Location: [facility] OR;  Service: Ophthalmology;  Laterality: Right;  ? WRIST ARTHROSCOPY WITH DEBRIDEMENT Right 05/01/2019  ? Procedure: WRIST ARTHROSCOPY WITH DEBRIDEMENT;  Surgeon: Dairl Ponder, MD;  Location: [facility];  Service: Orthopedics;  Laterality: Right;  ? ? ?Current Medications: ?Current Meds  ?Medication Sig  ? Aflibercept (EYLEA) 2 MG/0.05ML SOLN See admin instructions. Every six weeks  ? amoxicillin (AMOXIL) 500 MG capsule Take 4 capsules (2,000 mg total) by mouth 1 hour prior to dental appointment  ? atorvastatin (LIPITOR) 40 MG tablet TAKE 1 TABLET BY MOUTH ONCE A DAY  ? cholecalciferol (VITAMIN D3) 25 MCG (1000 UT) tablet Take 1,000 Units by mouth daily.  ? COVID-19 mRNA Vac-TriS, Pfizer, (PFIZER-BIONT COVID-19 VAC-TRIS) SUSP injection Inject into the muscle.  ? Dapagliflozin-metFORMIN HCl ER (XIGDUO XR) 12-998 MG TB24 Take 1 tablet by mouth daily  ? Dulaglutide (TRULICITY) 4.5 MG/0.5ML SOPN Inject 4.5 mg (1 pen) subcutaneously once a week  ? DULoxetine (CYMBALTA) 60 MG capsule Take 1 capsule (60 mg total) by mouth daily.  ? flecainide (TAMBOCOR) 50 MG tablet Take 1 tablet (50 mg total) by mouth 2 (two) times daily. Keep  office visit.  ? glucose blood test strip Use to test blood sugars 3 times a day  ? hydrALAZINE (APRESOLINE) 25 MG tablet TAKE 1 TABLET BY MOUTH TWO TIMES DAILY WITH BREAKFAST AND DINNER  ? hydrochlorothiazide (MICROZIDE) 12.5 MG capsule Take 1 capsule (12.5 mg total) by mouth daily.  ? indomethacin (INDOCIN) 50 MG capsule TAKE 1 CAPSULE BY MOUTH TWO TO THREE TIMES DAILY AS NEEDED FOR GOUT PAIN  ? insulin glargine-yfgn (SEMGLEE, YFGN,) 100 UNIT/ML Pen Inject 70 units under the skin twice a day  ? Insulin Pen Needle (UNIFINE PENTIPS) 32G X 4 MM MISC Use to adminster insulin subcutaneously twice daily  ? levothyroxine (SYNTHROID) 137 MCG tablet Take 1 tablet by mouth daily in the morning on an empty stomach  ? losartan (  COZAAR) 50 MG tablet TAKE 1 TABLET BY MOUTH ONCE A DAY  ? Melatonin 5 MG CAPS Take 5-10 mg by mouth at bedtime as needed (sleep).   ? metoprolol tartrate (LOPRESSOR) 50 MG tablet Take 1 tablet by mouth 2 times daily.  ? omeprazole (PRILOSEC) 20 MG capsule TAKE 1 CAPSULE BY MOUTH ONCE DAILY  ? rivaroxaban (XARELTO) 20 MG TABS tablet Take 1 tablet (20 mg total) by mouth daily with supper.  ? TRULICITY 3 MG/0.5ML SOPN Inject 3 mg as directed every Sunday.   ? [DISCONTINUED] amLODipine (NORVASC) 10 MG tablet Take 1 tablet (10 mg total) by mouth daily.  ?  ? ?Allergies:   Actos [pioglitazone hydrochloride], Avandia [rosiglitazone], Tetanus toxoids, and Food  ? ?Social History  ? ?Socioeconomic History  ? Marital status: Single  ?  Spouse name: Not on file  ? Number of children: Not on file  ? Years of education: Not on file  ? Highest education level: Not on file  ?Occupational History  ? Not on file  ?Tobacco Use  ? Smoking status: Former  ?  Years: 5.00  ?  Types: Cigarettes  ?  Quit date: 04/12/1977  ?  Years since quitting: 44.2  ? Smokeless tobacco: Never  ?Vaping Use  ? Vaping Use: Never used  ?Substance and Sexual Activity  ? Alcohol use: No  ? Drug use: No  ? Sexual activity: Not on file  ?Other  Topics Concern  ? Not on file  ?Social History Narrative  ? Not on file  ? ?Social Determinants of Health  ? ?Financial Resource Strain: Not on file  ?Food Insecurity: Not on file  ?Transportation Needs: Not on fi

## 2021-07-06 DIAGNOSIS — G5621 Lesion of ulnar nerve, right upper limb: Secondary | ICD-10-CM | POA: Diagnosis not present

## 2021-07-06 DIAGNOSIS — M79642 Pain in left hand: Secondary | ICD-10-CM | POA: Diagnosis not present

## 2021-07-06 DIAGNOSIS — M24131 Other articular cartilage disorders, right wrist: Secondary | ICD-10-CM | POA: Diagnosis not present

## 2021-07-07 ENCOUNTER — Ambulatory Visit (INDEPENDENT_AMBULATORY_CARE_PROVIDER_SITE_OTHER): Payer: 59 | Admitting: Physician Assistant

## 2021-07-07 ENCOUNTER — Encounter: Payer: Self-pay | Admitting: *Deleted

## 2021-07-07 ENCOUNTER — Encounter: Payer: Self-pay | Admitting: Physician Assistant

## 2021-07-07 ENCOUNTER — Other Ambulatory Visit (HOSPITAL_COMMUNITY): Payer: Self-pay

## 2021-07-07 VITALS — BP 124/70 | HR 58 | Ht 62.0 in | Wt 263.0 lb

## 2021-07-07 DIAGNOSIS — R0609 Other forms of dyspnea: Secondary | ICD-10-CM

## 2021-07-07 DIAGNOSIS — I5043 Acute on chronic combined systolic (congestive) and diastolic (congestive) heart failure: Secondary | ICD-10-CM | POA: Diagnosis not present

## 2021-07-07 DIAGNOSIS — E785 Hyperlipidemia, unspecified: Secondary | ICD-10-CM

## 2021-07-07 DIAGNOSIS — Z794 Long term (current) use of insulin: Secondary | ICD-10-CM | POA: Diagnosis not present

## 2021-07-07 DIAGNOSIS — I10 Essential (primary) hypertension: Secondary | ICD-10-CM

## 2021-07-07 DIAGNOSIS — I480 Paroxysmal atrial fibrillation: Secondary | ICD-10-CM | POA: Diagnosis not present

## 2021-07-07 DIAGNOSIS — R0789 Other chest pain: Secondary | ICD-10-CM

## 2021-07-07 DIAGNOSIS — E118 Type 2 diabetes mellitus with unspecified complications: Secondary | ICD-10-CM | POA: Diagnosis not present

## 2021-07-07 MED ORDER — AMLODIPINE BESYLATE 5 MG PO TABS
5.0000 mg | ORAL_TABLET | Freq: Every day | ORAL | 3 refills | Status: DC
  Filled 2021-07-07: qty 90, 90d supply, fill #0
  Filled 2021-11-18: qty 90, 90d supply, fill #1
  Filled 2022-03-16: qty 90, 90d supply, fill #2
  Filled 2022-06-15: qty 90, 90d supply, fill #3

## 2021-07-07 MED ORDER — HYDROCHLOROTHIAZIDE 12.5 MG PO CAPS
12.5000 mg | ORAL_CAPSULE | Freq: Every day | ORAL | 0 refills | Status: DC
  Filled 2021-07-07: qty 90, 90d supply, fill #0

## 2021-07-07 NOTE — Patient Instructions (Signed)
Medication Instructions:  ? ?START TAKING: AMLODIPINE 5 MG ONCE A DAY  ? ?START TAKING: HYDROCHLOROTHIAZIDE 12.5 MG  ONCE A DAY  ? ?FOR THREE DAYS ONLY : TAKE (HCTZ) 12.5 MG TWICE A DAY THEN RESUME:  TO TAKE ONCE DAILY  ? ?*If you need a refill on your cardiac medications before your next appointment, please call your pharmacy* ? ? ?Lab Work:  TSH CMET CBC AND BNP TODAY  ? ?If you have labs (blood work) drawn today and your tests are completely normal, you will receive your results only by: ?MyChart Message (if you have MyChart) OR ?A paper copy in the mail ?If you have any lab test that is abnormal or we need to change your treatment, we will call you to review the results. ? ? ?Testing/Procedures: Your physician has requested that you have an echocardiogram. Echocardiography is a painless test that uses sound waves to create images of your heart. It provides your doctor with information about the size and shape of your heart and how well your heart?s chambers and valves are working. This procedure takes approximately one hour. There are no restrictions for this procedure. ? ? ?Your physician has requested that you have a lexiscan myoview. For further information please visit [email or web address]. Please follow instruction sheet, as given. ? ? ?Follow-Up: ?At [facility], you and your health needs are our priority.  As part of our continuing mission to provide you with exceptional heart care, we have created designated Provider Care Teams.  These Care Teams include your primary Cardiologist (physician) and Advanced Practice Providers (APPs -  Physician Assistants and Nurse Practitioners) who all work together to provide you with the care you need, when you need it. ? ?We recommend signing up for the patient portal called "MyChart".  Sign up information is provided on this After Visit Summary.  MyChart is used to connect with patients for Virtual Visits (Telemedicine).  Patients are able to view lab/test results,  encounter notes, upcoming appointments, etc.  Non-urgent messages can be sent to your provider as well.   ?To learn more about what you can do with MyChart, go to [email or web address].   ? ?Your next appointment:   ?2-3  week(s) ? ?The format for your next appointment:   ?In Person ? ?Provider:   ?{ ?Jacolyn ReedyMichele Lenze, PA-C     ?  : ? ?Other Instructions ? ? ?Important Information About Sugar ? ? ? ? ?  ? ?   Two Gram Sodium Diet 2000 mg ? ?What is Sodium? Sodium is a mineral found naturally in many foods. The most significant source of sodium in the diet is table salt, which is about 40% sodium.  Processed, convenience, and preserved foods also contain a large amount of sodium.  The body needs only 500 mg of sodium daily to function,  A normal diet provides more than enough sodium even if you do not use salt. ? ?Why Limit Sodium? A build up of sodium in the body can cause thirst, increased blood pressure, shortness of breath, and water retention.  Decreasing sodium in the diet can reduce edema and risk of heart attack or stroke associated with high blood pressure.  Keep in mind that there are many other factors involved in these health problems.  Heredity, obesity, lack of exercise, cigarette smoking, stress and what you eat all play a role. ? ?General Guidelines: ?Do not add salt at the table or in cooking.  One teaspoon of salt contains over 2 grams  of sodium. ?Read food labels ?Avoid processed and convenience foods ?Ask your dietitian before eating any foods not dicussed in the menu planning guidelines ?Consult your physician if you wish to use a salt substitute or a sodium containing medication such as antacids.  Limit milk and milk products to 16 oz (2 cups) per day. ? ?Shopping Hints: ?READ LABELS!! "Dietetic" does not necessarily mean low sodium. ?Salt and other sodium ingredients are often added to foods during processing. ? ? ? ?Menu Planning Guidelines ?Food Group Choose More Often Avoid  ?Beverages (see  also the milk group All fruit juices, low-sodium, salt-free vegetables juices, low-sodium carbonated beverages Regular vegetable or tomato juices, commercially softened water used for drinking or cooking  ?Breads and Cereals Enriched white, wheat, rye and pumpernickel bread, hard rolls and dinner rolls; muffins, cornbread and waffles; most dry cereals, cooked cereal without added salt; unsalted crackers and breadsticks; low sodium or homemade bread crumbs Bread, rolls and crackers with salted tops; quick breads; instant hot cereals; pancakes; commercial bread stuffing; self-rising flower and biscuit mixes; regular bread crumbs or cracker crumbs  ?Desserts and Sweets Desserts and sweets mad with mild should be within allowance Instant pudding mixes and cake mixes  ?Fats Butter or margarine; vegetable oils; unsalted salad dressings, regular salad dressings limited to 1 Tbs; light, sour and heavy cream Regular salad dressings containing bacon fat, bacon bits, and salt pork; snack dips made with instant soup mixes or processed cheese; salted nuts  ?Fruits Most fresh, frozen and canned fruits Fruits processed with salt or sodium-containing ingredient (some dried fruits are processed with sodium sulfites  ? ? ? ? ? ? ?Vegetables Fresh, frozen vegetables and low- sodium canned vegetables Regular canned vegetables, sauerkraut, pickled vegetables, and others prepared in brine; frozen vegetables in sauces; vegetables seasoned with ham, bacon or salt pork  ?Condiments, Sauces, Miscellaneous ? Salt substitute with physician's approval; pepper, herbs, spices; vinegar, lemon or lime juice; hot pepper sauce; garlic powder, onion powder, low sodium soy sauce (1 Tbs.); low sodium condiments (ketchup, chili sauce, mustard) in limited amounts (1 tsp.) fresh ground horseradish; unsalted tortilla chips, pretzels, potato chips, popcorn, salsa (1/4 cup) Any seasoning made with salt including garlic salt, celery salt, onion salt, and  seasoned salt; sea salt, rock salt, kosher salt; meat tenderizers; monosodium glutamate; mustard, regular soy sauce, barbecue, sauce, chili sauce, teriyaki sauce, steak sauce, Worcestershire sauce, and most flavored vinegars; canned gravy and mixes; regular condiments; salted snack foods, olives, picles, relish, horseradish sauce, catsup  ? ?Food preparation: Try these seasonings ?Meats:    ?Pork Sage, onion Serve with applesauce  ?Chicken Poultry seasoning, thyme, parsley Serve with cranberry sauce  ?Lamb Curry powder, rosemary, garlic, thyme Serve with mint sauce or jelly  ?Veal Marjoram, basil Serve with current jelly, cranberry sauce  ?Beef Pepper, bay leaf Serve with dry mustard, unsalted chive butter  ?Fish [place] leaf, dill Serve with unsalted lemon butter, unsalted parsley butter  ?Vegetables:    ?Asparagus Lemon juice   ?Broccoli Lemon juice   ?Carrots Mustard dressing parsley, mint, nutmeg, glazed with unsalted butter and sugar   ?Green beans Marjoram, lemon juice, nutmeg,dill seed   ?Tomatoes Basil, marjoram, onion   ?Spice /blend for "Salt Shaker" 4 tsp ground thyme ?1 tsp ground sage 3 tsp ground rosemary ?4 tsp ground marjoram  ? ?Test your knowledge ?A product that says "Salt Free" may still contain sodium. True or False ?Garlic Powder and Hot Pepper Sauce an be used as alternative seasonings.True  or False ?Processed foods have more sodium than fresh foods.  True or False ?Canned Vegetables have less sodium than froze True or False ? ? ?WAYS TO DECREASE YOUR SODIUM INTAKE ?Avoid the use of added salt in cooking and at the table.  Table salt (and other prepared seasonings which contain salt) is probably one of the greatest sources of sodium in the diet.  Unsalted foods can gain flavor from the sweet, sour, and butter taste sensations of herbs and spices.  Instead of using salt for seasoning, try the following seasonings with the foods listed.  Remember: how you use them to enhance natural food flavors is  limited only by your creativity... ?Allspice-Meat, fish, eggs, fruit, peas, red and yellow vegetables ?Almond Extract-Fruit baked goods ?Anise Seed-Sweet breads, fruit, carrots, beets, cottage cheese, cooki

## 2021-07-08 ENCOUNTER — Telehealth: Payer: Self-pay | Admitting: Physician Assistant

## 2021-07-08 ENCOUNTER — Other Ambulatory Visit (HOSPITAL_COMMUNITY): Payer: Self-pay

## 2021-07-08 DIAGNOSIS — I5043 Acute on chronic combined systolic (congestive) and diastolic (congestive) heart failure: Secondary | ICD-10-CM

## 2021-07-08 DIAGNOSIS — N289 Disorder of kidney and ureter, unspecified: Secondary | ICD-10-CM

## 2021-07-08 LAB — CBC
Hematocrit: 39 % (ref 34.0–46.6)
Hemoglobin: 12.3 g/dL (ref 11.1–15.9)
MCH: 26.9 pg (ref 26.6–33.0)
MCHC: 31.5 g/dL (ref 31.5–35.7)
MCV: 85 fL (ref 79–97)
Platelets: 361 10*3/uL (ref 150–450)
RBC: 4.58 x10E6/uL (ref 3.77–5.28)
RDW: 15.5 % — ABNORMAL HIGH (ref 11.7–15.4)
WBC: 9.6 10*3/uL (ref 3.4–10.8)

## 2021-07-08 LAB — COMPREHENSIVE METABOLIC PANEL
ALT: 17 IU/L (ref 0–32)
AST: 26 IU/L (ref 0–40)
Albumin/Globulin Ratio: 1.5 (ref 1.2–2.2)
Albumin: 4.1 g/dL (ref 3.7–4.7)
Alkaline Phosphatase: 79 IU/L (ref 44–121)
BUN/Creatinine Ratio: 23 (ref 12–28)
BUN: 28 mg/dL — ABNORMAL HIGH (ref 8–27)
Bilirubin Total: 0.3 mg/dL (ref 0.0–1.2)
CO2: 22 mmol/L (ref 20–29)
Calcium: 9.6 mg/dL (ref 8.7–10.3)
Chloride: 108 mmol/L — ABNORMAL HIGH (ref 96–106)
Creatinine, Ser: 1.21 mg/dL — ABNORMAL HIGH (ref 0.57–1.00)
Globulin, Total: 2.7 g/dL (ref 1.5–4.5)
Glucose: 142 mg/dL — ABNORMAL HIGH (ref 70–99)
Potassium: 4.9 mmol/L (ref 3.5–5.2)
Sodium: 144 mmol/L (ref 134–144)
Total Protein: 6.8 g/dL (ref 6.0–8.5)
eGFR: 48 mL/min/{1.73_m2} — ABNORMAL LOW (ref >59)

## 2021-07-08 LAB — TSH: TSH: 0.583 u[IU]/mL (ref 0.450–4.500)

## 2021-07-08 LAB — PRO B NATRIURETIC PEPTIDE: NT-Pro BNP: 334 pg/mL — ABNORMAL HIGH (ref 0–301)

## 2021-07-08 NOTE — Telephone Encounter (Signed)
Spoke with pt and advised of lab results per M.Lenze, PA-C: ? ?Heart failure marker up as suspected. Kidney function up slightly but better than a year ago. Recheck bmet in 2 weeks since HCTZ restarted.  ? ?Appointment scheduled for 07/21/2021 for BMET.  Pt verbalizes understanding and agrees with current plan. ?

## 2021-07-08 NOTE — Telephone Encounter (Signed)
Patient returned call for lab results.  

## 2021-07-09 ENCOUNTER — Other Ambulatory Visit (HOSPITAL_COMMUNITY): Payer: Self-pay

## 2021-07-13 ENCOUNTER — Telehealth (HOSPITAL_COMMUNITY): Payer: Self-pay | Admitting: *Deleted

## 2021-07-13 NOTE — Progress Notes (Signed)
? ?Cardiology Office Note   ? ?Date:  07/21/2021  ? ?ID:  [name], DOB 08/25/1950, MRN [id number] ? ? ?PCP:  Marden Noble, MD ?  ?[facility]  ?Cardiologist:  Charlton Haws, MD   ?Advanced Practice Provider:  No care team member to display ?Electrophysiologist:  None  ? ?[id number]}  ? ?Chief Complaint  ?Patient presents with  ? Follow-up  ? ? ?History of Present Illness:  ?[name] is a 71 y.o. female with history of PAF on flecainide Xarelto and beta-blocker, hypertension, HLD, obesity.  Patient saw Dr. Eden Emms 11/04/2020 and he ordered GXT to rule out proarrhythmia.  She had poor exercise tolerance and did not meet target heart rate as well as hypertensive response to exercise.  No stress-induced arrhythmias or ischemic changes.  Dr. Eden Emms recommended an exercise program. ? ?I saw the patient 07/07/21 after PCP stopped her HCTZ and increased her amlodipine 10 mg daily and now is having swelling and shortness of breath . She was having trouble with gout most likely reason for stopping.She felt like she had an episode of Afib on prednisone. Still having occasion heart fluttering and DOE with little activity and chest feels heavy. HR goes fast when walking. I decreased amlodipine 5 mg restart HCTZ 12.5 mg 2 a day for 3 days then once daily. May need higher dose or lasix. I ordered lexi and echo.  ?  ?Lexiscan 07/20/21 old infarct no ischemia, Echo 07/19/21 normal LVEF with no WMA, grade 1 DD. Crt 1.32 up from 1.21. ? ?Patient comes in for f/u. Swelling better since back on HCTZ. Has some left ankle pain that she doesn't think is gout. Dr. Yisroel Ramming didn't see anything on x-ray. Constant ache. No worse since back on HCTZ. Having trouble sleeping and her heart starts racing and a lot of body twitching. Using CPAP nightly. ? ? ?Past Medical History:  ?Diagnosis Date  ? A-fib (HCC)   ? Anemia   ? hx of  ? Arthritis   ? Chest pain   ? Constipation   ? Depression   ? Diabetes mellitus ORAL MEDS  ?  Diabetic retinopathy (HCC)   ? Dyspnea   ? with minimal exertion-deconditioned  ? Dyspnea   ? Dysrhythmia   ? a-fib  ? Food allergy   ? GERD (gastroesophageal reflux disease)   ? occasional  ? Gout   ? History of kidney stones   ? Hypercholesteremia   ? Hyperlipidemia   ? Hypertension   ? Hypertensive kidney disease   ? Hypothyroidism   ? Knee pain, right   ? Left arm numbness DUE TO CERVICAL PINCHED NERVE  ? Obesity   ? OSA on CPAP   ? uses CPAP nightly  ? Osteoarthritis   ? Palpitations   ? Pinched nerve in neck   ? Pleurisy   ? Pneumonia   ? hx of  ? PONV (postoperative nausea and vomiting)   ? for 3-4 days after general anesthesia approx 10-12 years ago  ? Sciatica   ? Swelling of knee joint, right   ? Synovitis of knee RIGHT  ? Trigger finger, left   ? left index  ? Vitamin D deficiency   ? ? ?Past Surgical History:  ?Procedure Laterality Date  ? CESAREAN SECTION  X3  ? KNEE ARTHROSCOPY  04/20/2011  ? Procedure: ARTHROSCOPY KNEE;  Surgeon: Loanne Drilling, MD;  Location: [facility];  Service: Orthopedics;  Laterality: Right;  WITH  SYNOVECTOMY  ? LEFT CARPAL TUNNEL / LEFT MIDDLE & RING FINGER TRIGGER RELEASE  08-26-2008  ? LEFT SHOULDER ARTHROSCOPY W/ DEBRIDEMENT  09-09-2003  ? LEFT SHOULDER ARTHROSCOPY/ LEFT THUMB TRIGGER RELEASE  02-22-2005  ? PHOTOCOAGULATION WITH LASER Right 03/20/2018  ? Procedure: PHOTOCOAGULATION WITH LASER;  Surgeon: Jalene Mullet, MD;  Location: [place];  Service: Ophthalmology;  Laterality: Right;  ? PULLEY RELEASE LEFT LONG FINGER  07-14-2009  ? RADIAL HEAD ARTHROPLASTY Right 06/17/2018  ? Procedure: RADIAL HEAD ARTHROPLASTY;  Surgeon: Hiram Gash, MD;  Location: [place];  Service: Orthopedics;  Laterality: Right;  ? RIGHT CARPAL TUNNEL/ RIGHT THUMB TRIGGER RELEASE'S  11-28-2006  ? RIGHT SHOULDER ARTHROSCOPY W/ ROTATOR CUFF REPAIR  01-13-2004  ? SHOULDER ARTHROSCOPY DISTAL CLAVICLE EXCISION AND OPEN ROTATOR CUFF REPAIR  09-07-2004  ? LEFT  ? SHOULDER ARTHROSCOPY W/ ACROMIAL  REPAIR  11-29-2005  ? LEFT  ? TONSILLECTOMY AND ADENOIDECTOMY  child  ? TOTAL KNEE ARTHROPLASTY  12-14-2009  ? RIGHT  ? TOTAL KNEE ARTHROPLASTY Left 11/05/2012  ? Procedure: LEFT TOTAL KNEE ARTHROPLASTY;  Surgeon: Gearlean Alf, MD;  Location[facility];  Service: Orthopedics;  Laterality: Left;  ? TOTAL KNEE REVISION Right 07/29/2016  ? Procedure: RIGHT KNEE POLY-LINER EXCHANGE;  Surgeon: Mcarthur Rossetti, MD;  Location: [facility];  Service: Orthopedics;  Laterality: Right;  ? TRIGGER FINGER RELEASE Right 02/21/2013  ? Procedure: RIGHT RING A-1 PULLEY RELEASE    (MINOR PROCEDURE) ;  Surgeon: Cammie Sickle., MD;  Location: [facility];  Service: Orthopedics;  Laterality: Right;  ? TRIGGER FINGER RELEASE Left 09/22/2016  ? Procedure: RELEASE TRIGGER FINGER LEFT INDEX FINGER;  Surgeon: Mcarthur Rossetti, MD;  Location: [place];  Service: Orthopedics;  Laterality: Left;  ? TRIGGER FINGER RELEASE Right 01/20/2017  ? Procedure: RELEASE TRIGGER FINGER/A-1 PULLEY RIGHT INDEX FINGER;  Surgeon: Melrose Nakayama, MD;  Location: [place];  Service: Orthopedics;  Laterality: Right;  ? VITRECTOMY 25 GAUGE WITH SCLERAL BUCKLE Right 03/20/2018  ? Procedure: RIGHT EYE VITRECTOMY WITH  ENDOLASER PARENTAL PHOTOCOAGULATION 25 GAUGE;  Surgeon: Jalene Mullet, MD;  Location: [place];  Service: Ophthalmology;  Laterality: Right;  ? WRIST ARTHROSCOPY WITH DEBRIDEMENT Right 05/01/2019  ? Procedure: WRIST ARTHROSCOPY WITH DEBRIDEMENT;  Surgeon: Charlotte Crumb, MD;  Location: [place];  Service: Orthopedics;  Laterality: Right;  ? ? ?Current Medications: ?Current Meds  ?Medication Sig  ? Aflibercept (EYLEA) 2 MG/0.05ML SOLN See admin instructions. Every six weeks  ? amLODipine (NORVASC) 5 MG tablet Take 1 tablet (5 mg total) by mouth daily.  ? amoxicillin (AMOXIL) 500 MG capsule Take 4 capsules (2,000 mg total) by mouth 1 hour prior to dental appointment  ? atorvastatin (LIPITOR) 40 MG  tablet TAKE 1 TABLET BY MOUTH ONCE A DAY  ? cholecalciferol (VITAMIN D3) 25 MCG (1000 UT) tablet Take 1,000 Units by mouth daily.  ? Dapagliflozin-metFORMIN HCl ER (XIGDUO XR) 12-998 MG TB24 Take 1 tablet by mouth daily  ? Dulaglutide (TRULICITY) 4.5 0000000 SOPN Inject 4.5 mg (1 pen) subcutaneously once a week  ? DULoxetine (CYMBALTA) 60 MG capsule Take 1 capsule (60 mg total) by mouth daily.  ? flecainide (TAMBOCOR) 50 MG tablet Take 1 tablet (50 mg total) by mouth 2 (two) times daily. Keep office visit.  ? glucose blood test strip Use to test blood sugars 3 times a day  ? hydrALAZINE (APRESOLINE) 25 MG tablet TAKE 1 TABLET BY MOUTH TWO TIMES DAILY WITH  BREAKFAST AND DINNER  ? indomethacin (INDOCIN) 50 MG capsule TAKE 1 CAPSULE BY MOUTH TWO TO THREE TIMES DAILY AS NEEDED FOR GOUT PAIN  ? insulin glargine-yfgn (SEMGLEE, YFGN,) 100 UNIT/ML Pen Inject 70 units under the skin twice a day  ? Insulin Pen Needle (UNIFINE PENTIPS) 32G X 4 MM MISC Use to adminster insulin subcutaneously twice daily  ? levothyroxine (SYNTHROID) 137 MCG tablet Take 1 tablet by mouth daily in the morning on an empty stomach  ? losartan (COZAAR) 50 MG tablet TAKE 1 TABLET BY MOUTH ONCE A DAY  ? Melatonin 5 MG CAPS Take 5-10 mg by mouth at bedtime as needed (sleep).   ? metoprolol tartrate (LOPRESSOR) 50 MG tablet Take 1 tablet by mouth 2 times daily.  ? omeprazole (PRILOSEC) 20 MG capsule TAKE 1 CAPSULE BY MOUTH ONCE DAILY  ? rivaroxaban (XARELTO) 20 MG TABS tablet Take 1 tablet (20 mg total) by mouth daily with supper.  ? spironolactone (ALDACTONE) 25 MG tablet Take 0.5 tablets (12.5 mg total) by mouth daily.  ? [DISCONTINUED] hydrochlorothiazide (MICROZIDE) 12.5 MG capsule Take 1 capsule (12.5 mg total) by mouth daily.  ?  ? ?Allergies:   Actos [pioglitazone hydrochloride], Avandia [rosiglitazone], Tetanus toxoids, Pioglitazone, and Food  ? ?Social History  ? ?Socioeconomic History  ? Marital status: Single  ?  Spouse name: Not on file  ?  Number of children: Not on file  ? Years of education: Not on file  ? Highest education level: Not on file  ?Occupational History  ? Not on file  ?Tobacco Use  ? Smoking status: Former  ?  Years: 5.00

## 2021-07-13 NOTE — Telephone Encounter (Signed)
Left message on voicemail per DPR in reference to upcoming appointment scheduled on 07/19/2021 at 8:15 with detailed instructions given per Myocardial Perfusion Study Information Sheet for the test. LM to arrive 15 minutes early, and that it is imperative to arrive on time for appointment to keep from having the test rescheduled. If you need to cancel or reschedule your appointment, please call the office within 24 hours of your appointment. Failure to do so may result in a cancellation of your appointment, and a $50 no show fee. Phone number given for call back for any questions.  ?

## 2021-07-19 ENCOUNTER — Ambulatory Visit (HOSPITAL_BASED_OUTPATIENT_CLINIC_OR_DEPARTMENT_OTHER): Payer: 59

## 2021-07-19 ENCOUNTER — Ambulatory Visit (HOSPITAL_COMMUNITY): Payer: 59 | Attending: Cardiology

## 2021-07-19 DIAGNOSIS — R0609 Other forms of dyspnea: Secondary | ICD-10-CM

## 2021-07-19 DIAGNOSIS — R0789 Other chest pain: Secondary | ICD-10-CM | POA: Insufficient documentation

## 2021-07-19 LAB — ECHOCARDIOGRAM COMPLETE
Area-P 1/2: 2.81 cm2
S' Lateral: 2.8 cm

## 2021-07-19 MED ORDER — TECHNETIUM TC 99M TETROFOSMIN IV KIT
31.7000 | PACK | Freq: Once | INTRAVENOUS | Status: AC | PRN
  Administered 2021-07-19: 31.7 via INTRAVENOUS
  Filled 2021-07-19: qty 32

## 2021-07-19 MED ORDER — REGADENOSON 0.4 MG/5ML IV SOLN
0.4000 mg | Freq: Once | INTRAVENOUS | Status: AC
  Administered 2021-07-19: 0.4 mg via INTRAVENOUS

## 2021-07-20 ENCOUNTER — Other Ambulatory Visit: Payer: 59 | Admitting: *Deleted

## 2021-07-20 ENCOUNTER — Ambulatory Visit (HOSPITAL_COMMUNITY): Payer: 59 | Attending: Cardiology

## 2021-07-20 DIAGNOSIS — N289 Disorder of kidney and ureter, unspecified: Secondary | ICD-10-CM | POA: Diagnosis not present

## 2021-07-20 DIAGNOSIS — I5043 Acute on chronic combined systolic (congestive) and diastolic (congestive) heart failure: Secondary | ICD-10-CM

## 2021-07-20 LAB — MYOCARDIAL PERFUSION IMAGING
LV dias vol: 106 mL (ref 46–106)
LV sys vol: 46 mL
Nuc Stress EF: 57 %
Peak HR: 63 {beats}/min
Rest HR: 55 {beats}/min
Rest Nuclear Isotope Dose: 30.9 mCi
SDS: 0
SRS: 4
SSS: 5
ST Depression (mm): 0 mm
Stress Nuclear Isotope Dose: 31.7 mCi
TID: 0.91

## 2021-07-20 MED ORDER — TECHNETIUM TC 99M TETROFOSMIN IV KIT
30.9000 | PACK | Freq: Once | INTRAVENOUS | Status: AC | PRN
  Administered 2021-07-20: 30.9 via INTRAVENOUS
  Filled 2021-07-20: qty 31

## 2021-07-21 ENCOUNTER — Other Ambulatory Visit (HOSPITAL_COMMUNITY): Payer: Self-pay

## 2021-07-21 ENCOUNTER — Encounter: Payer: Self-pay | Admitting: Physician Assistant

## 2021-07-21 ENCOUNTER — Ambulatory Visit (INDEPENDENT_AMBULATORY_CARE_PROVIDER_SITE_OTHER): Payer: 59 | Admitting: Physician Assistant

## 2021-07-21 VITALS — BP 120/60 | HR 60 | Ht 62.0 in | Wt 258.0 lb

## 2021-07-21 DIAGNOSIS — I10 Essential (primary) hypertension: Secondary | ICD-10-CM

## 2021-07-21 DIAGNOSIS — I5032 Chronic diastolic (congestive) heart failure: Secondary | ICD-10-CM

## 2021-07-21 DIAGNOSIS — Z794 Long term (current) use of insulin: Secondary | ICD-10-CM

## 2021-07-21 DIAGNOSIS — R079 Chest pain, unspecified: Secondary | ICD-10-CM

## 2021-07-21 DIAGNOSIS — N183 Chronic kidney disease, stage 3 unspecified: Secondary | ICD-10-CM

## 2021-07-21 DIAGNOSIS — E782 Mixed hyperlipidemia: Secondary | ICD-10-CM | POA: Diagnosis not present

## 2021-07-21 DIAGNOSIS — E118 Type 2 diabetes mellitus with unspecified complications: Secondary | ICD-10-CM | POA: Diagnosis not present

## 2021-07-21 DIAGNOSIS — I480 Paroxysmal atrial fibrillation: Secondary | ICD-10-CM | POA: Diagnosis not present

## 2021-07-21 LAB — BASIC METABOLIC PANEL
BUN/Creatinine Ratio: 20 (ref 12–28)
BUN: 26 mg/dL (ref 8–27)
CO2: 21 mmol/L (ref 20–29)
Calcium: 9.3 mg/dL (ref 8.7–10.3)
Chloride: 100 mmol/L (ref 96–106)
Creatinine, Ser: 1.32 mg/dL — ABNORMAL HIGH (ref 0.57–1.00)
Glucose: 136 mg/dL — ABNORMAL HIGH (ref 70–99)
Potassium: 4.6 mmol/L (ref 3.5–5.2)
Sodium: 139 mmol/L (ref 134–144)
eGFR: 43 mL/min/{1.73_m2} — ABNORMAL LOW (ref >59)

## 2021-07-21 MED ORDER — SPIRONOLACTONE 25 MG PO TABS
12.5000 mg | ORAL_TABLET | ORAL | 1 refills | Status: DC
Start: 2021-07-21 — End: 2022-05-24
  Filled 2021-07-21: qty 21, 84d supply, fill #0
  Filled 2021-10-04: qty 21, 84d supply, fill #1
  Filled 2021-12-15: qty 21, 84d supply, fill #2
  Filled 2022-02-14 – 2022-03-08 (×3): qty 21, 84d supply, fill #3
  Filled 2022-05-23: qty 21, 84d supply, fill #4

## 2021-07-21 MED ORDER — SPIRONOLACTONE 25 MG PO TABS
12.5000 mg | ORAL_TABLET | Freq: Every day | ORAL | 1 refills | Status: DC
  Filled 2021-07-21: qty 45, 90d supply, fill #0

## 2021-07-21 NOTE — Patient Instructions (Addendum)
Medication Instructions:  ?DISCONTINUE Hydrochlorothiazide ?START Spironolactone 12.5mg  CUT a 25mg  tablet in half and take half tablet once a day  ?*If you need a refill on your cardiac medications before your next appointment, please call your pharmacy* ? ? ?Lab Work: ?Your physician recommends that you return for lab work in: 2-3 WEEKS-BMET ?If you have labs (blood work) drawn today and your tests are completely normal, you will receive your results only by: ?MyChart Message (if you have MyChart) OR ?A paper copy in the mail ?If you have any lab test that is abnormal or we need to change your treatment, we will call you to review the results. ? ? ?Testing/Procedures: ?None Ordered ? ? ?Follow-Up: ?At [facility], you and your health needs are our priority.  As part of our continuing mission to provide you with exceptional heart care, we have created designated Provider Care Teams.  These Care Teams include your primary Cardiologist (physician) and Advanced Practice Providers (APPs -  Physician Assistants and Nurse Practitioners) who all work together to provide you with the care you need, when you need it. ? ?We recommend signing up for the patient portal called "MyChart".  Sign up information is provided on this After Visit Summary.  MyChart is used to connect with patients for Virtual Visits (Telemedicine).  Patients are able to view lab/test results, encounter notes, upcoming appointments, etc.  Non-urgent messages can be sent to your provider as well.   ?To learn more about what you can do with MyChart, go to [facility].   ? ?Your next appointment:   ?4-5 month(s) ? ?The format for your next appointment:   ?In Person ? ?Provider:   ?[email or web address], MD   ? ? ?Other Instructions ? ? ?Important Information About Sugar ? ? ? ? ?  ?

## 2021-07-21 NOTE — Addendum Note (Signed)
Addended by: Ulice Brilliant T on: 07/21/2021 12:56 PM ? ? Modules accepted: Orders ? ?

## 2021-07-22 ENCOUNTER — Other Ambulatory Visit (HOSPITAL_COMMUNITY): Payer: Self-pay

## 2021-07-22 MED ORDER — LOSARTAN POTASSIUM 50 MG PO TABS
50.0000 mg | ORAL_TABLET | Freq: Every day | ORAL | 2 refills | Status: DC
  Filled 2021-07-22 – 2021-11-18 (×2): qty 90, 90d supply, fill #0
  Filled 2022-02-14: qty 90, 90d supply, fill #1

## 2021-07-23 ENCOUNTER — Other Ambulatory Visit (HOSPITAL_COMMUNITY): Payer: Self-pay

## 2021-07-30 ENCOUNTER — Other Ambulatory Visit (HOSPITAL_COMMUNITY): Payer: Self-pay

## 2021-08-02 DIAGNOSIS — G4733 Obstructive sleep apnea (adult) (pediatric): Secondary | ICD-10-CM | POA: Diagnosis not present

## 2021-08-02 DIAGNOSIS — E113511 Type 2 diabetes mellitus with proliferative diabetic retinopathy with macular edema, right eye: Secondary | ICD-10-CM | POA: Diagnosis not present

## 2021-08-03 DIAGNOSIS — S63653A Sprain of metacarpophalangeal joint of left middle finger, initial encounter: Secondary | ICD-10-CM | POA: Diagnosis not present

## 2021-08-03 DIAGNOSIS — G5621 Lesion of ulnar nerve, right upper limb: Secondary | ICD-10-CM | POA: Diagnosis not present

## 2021-08-04 ENCOUNTER — Other Ambulatory Visit: Payer: 59 | Admitting: *Deleted

## 2021-08-04 DIAGNOSIS — E782 Mixed hyperlipidemia: Secondary | ICD-10-CM | POA: Diagnosis not present

## 2021-08-04 DIAGNOSIS — Z794 Long term (current) use of insulin: Secondary | ICD-10-CM | POA: Diagnosis not present

## 2021-08-04 DIAGNOSIS — R079 Chest pain, unspecified: Secondary | ICD-10-CM

## 2021-08-04 DIAGNOSIS — I5032 Chronic diastolic (congestive) heart failure: Secondary | ICD-10-CM | POA: Diagnosis not present

## 2021-08-04 DIAGNOSIS — I10 Essential (primary) hypertension: Secondary | ICD-10-CM

## 2021-08-04 DIAGNOSIS — N183 Chronic kidney disease, stage 3 unspecified: Secondary | ICD-10-CM | POA: Diagnosis not present

## 2021-08-04 DIAGNOSIS — I480 Paroxysmal atrial fibrillation: Secondary | ICD-10-CM | POA: Diagnosis not present

## 2021-08-04 DIAGNOSIS — E118 Type 2 diabetes mellitus with unspecified complications: Secondary | ICD-10-CM

## 2021-08-05 LAB — BASIC METABOLIC PANEL
BUN/Creatinine Ratio: 26 (ref 12–28)
BUN: 31 mg/dL — ABNORMAL HIGH (ref 8–27)
CO2: 20 mmol/L (ref 20–29)
Calcium: 9.4 mg/dL (ref 8.7–10.3)
Chloride: 108 mmol/L — ABNORMAL HIGH (ref 96–106)
Creatinine, Ser: 1.21 mg/dL — ABNORMAL HIGH (ref 0.57–1.00)
Glucose: 83 mg/dL (ref 70–99)
Potassium: 4.7 mmol/L (ref 3.5–5.2)
Sodium: 143 mmol/L (ref 134–144)
eGFR: 48 mL/min/{1.73_m2} — ABNORMAL LOW (ref >59)

## 2021-08-12 ENCOUNTER — Other Ambulatory Visit (HOSPITAL_COMMUNITY): Payer: Self-pay

## 2021-08-13 ENCOUNTER — Other Ambulatory Visit (HOSPITAL_COMMUNITY): Payer: Self-pay

## 2021-08-16 DIAGNOSIS — E113312 Type 2 diabetes mellitus with moderate nonproliferative diabetic retinopathy with macular edema, left eye: Secondary | ICD-10-CM | POA: Diagnosis not present

## 2021-08-17 ENCOUNTER — Ambulatory Visit (INDEPENDENT_AMBULATORY_CARE_PROVIDER_SITE_OTHER): Payer: 59

## 2021-08-17 DIAGNOSIS — R002 Palpitations: Secondary | ICD-10-CM

## 2021-08-17 DIAGNOSIS — I480 Paroxysmal atrial fibrillation: Secondary | ICD-10-CM

## 2021-08-17 NOTE — Telephone Encounter (Signed)
Josue Hector, MD  You 1 minute ago (11:39 AM)   Would wait till she is off steroids and if still with palpitations and do monitor and refer to EP

## 2021-08-17 NOTE — Progress Notes (Unsigned)
Enrolled for [employer] to mail a ZIO XT long term holter monitor to the patients address on file.  

## 2021-08-18 ENCOUNTER — Other Ambulatory Visit: Payer: Self-pay | Admitting: Internal Medicine

## 2021-08-18 DIAGNOSIS — Z1231 Encounter for screening mammogram for malignant neoplasm of breast: Secondary | ICD-10-CM

## 2021-08-21 DIAGNOSIS — R002 Palpitations: Secondary | ICD-10-CM | POA: Diagnosis not present

## 2021-08-24 ENCOUNTER — Other Ambulatory Visit (HOSPITAL_COMMUNITY): Payer: Self-pay

## 2021-08-25 ENCOUNTER — Other Ambulatory Visit (HOSPITAL_COMMUNITY): Payer: Self-pay

## 2021-08-25 MED ORDER — LOSARTAN POTASSIUM 50 MG PO TABS
ORAL_TABLET | ORAL | 3 refills | Status: DC
  Filled 2021-08-25: qty 90, 90d supply, fill #0

## 2021-08-27 ENCOUNTER — Other Ambulatory Visit (HOSPITAL_COMMUNITY): Payer: Self-pay

## 2021-08-30 DIAGNOSIS — M24131 Other articular cartilage disorders, right wrist: Secondary | ICD-10-CM | POA: Diagnosis not present

## 2021-08-30 DIAGNOSIS — S63659A Sprain of metacarpophalangeal joint of unspecified finger, initial encounter: Secondary | ICD-10-CM | POA: Diagnosis not present

## 2021-08-30 DIAGNOSIS — G5621 Lesion of ulnar nerve, right upper limb: Secondary | ICD-10-CM | POA: Diagnosis not present

## 2021-08-31 ENCOUNTER — Other Ambulatory Visit (HOSPITAL_COMMUNITY): Payer: Self-pay

## 2021-08-31 DIAGNOSIS — M85851 Other specified disorders of bone density and structure, right thigh: Secondary | ICD-10-CM | POA: Diagnosis not present

## 2021-08-31 DIAGNOSIS — E1165 Type 2 diabetes mellitus with hyperglycemia: Secondary | ICD-10-CM | POA: Diagnosis not present

## 2021-08-31 DIAGNOSIS — Z794 Long term (current) use of insulin: Secondary | ICD-10-CM | POA: Diagnosis not present

## 2021-08-31 DIAGNOSIS — M109 Gout, unspecified: Secondary | ICD-10-CM | POA: Diagnosis not present

## 2021-08-31 DIAGNOSIS — Z87442 Personal history of urinary calculi: Secondary | ICD-10-CM | POA: Diagnosis not present

## 2021-08-31 DIAGNOSIS — E039 Hypothyroidism, unspecified: Secondary | ICD-10-CM | POA: Diagnosis not present

## 2021-08-31 MED ORDER — INSULIN GLARGINE-YFGN 100 UNIT/ML ~~LOC~~ SOPN
PEN_INJECTOR | SUBCUTANEOUS | 4 refills | Status: DC
  Filled 2021-08-31: qty 135, 90d supply, fill #0

## 2021-09-01 ENCOUNTER — Other Ambulatory Visit: Payer: 59

## 2021-09-06 ENCOUNTER — Other Ambulatory Visit (HOSPITAL_COMMUNITY): Payer: Self-pay

## 2021-09-09 ENCOUNTER — Other Ambulatory Visit (HOSPITAL_COMMUNITY): Payer: Self-pay

## 2021-09-20 DIAGNOSIS — E113312 Type 2 diabetes mellitus with moderate nonproliferative diabetic retinopathy with macular edema, left eye: Secondary | ICD-10-CM | POA: Diagnosis not present

## 2021-09-21 ENCOUNTER — Other Ambulatory Visit (HOSPITAL_COMMUNITY): Payer: Self-pay

## 2021-09-21 MED ORDER — INSULIN GLARGINE-YFGN 100 UNIT/ML ~~LOC~~ SOPN
PEN_INJECTOR | SUBCUTANEOUS | 4 refills | Status: DC
  Filled 2021-09-21: qty 180, 90d supply, fill #0
  Filled 2021-10-28: qty 120, 80d supply, fill #0
  Filled 2021-11-08: qty 45, 30d supply, fill #0
  Filled 2021-12-05: qty 45, 30d supply, fill #1
  Filled 2022-01-10: qty 135, 90d supply, fill #2
  Filled 2022-04-18: qty 135, 90d supply, fill #3
  Filled 2022-07-12: qty 135, 90d supply, fill #4

## 2021-09-22 ENCOUNTER — Other Ambulatory Visit (HOSPITAL_COMMUNITY): Payer: Self-pay

## 2021-09-27 DIAGNOSIS — E113511 Type 2 diabetes mellitus with proliferative diabetic retinopathy with macular edema, right eye: Secondary | ICD-10-CM | POA: Diagnosis not present

## 2021-09-27 DIAGNOSIS — R002 Palpitations: Secondary | ICD-10-CM | POA: Diagnosis not present

## 2021-10-04 ENCOUNTER — Other Ambulatory Visit: Payer: Self-pay | Admitting: Cardiovascular Disease

## 2021-10-04 ENCOUNTER — Other Ambulatory Visit (HOSPITAL_COMMUNITY): Payer: Self-pay

## 2021-10-04 DIAGNOSIS — I480 Paroxysmal atrial fibrillation: Secondary | ICD-10-CM

## 2021-10-04 MED ORDER — RIVAROXABAN 20 MG PO TABS
20.0000 mg | ORAL_TABLET | Freq: Every day | ORAL | 1 refills | Status: DC
  Filled 2021-10-04: qty 90, 90d supply, fill #0
  Filled 2022-01-02: qty 90, 90d supply, fill #1

## 2021-10-04 NOTE — Telephone Encounter (Signed)
Prescription refill request for Xarelto received.  Indication: PAF Last office visit: 07/21/21  Leda Gauze PA-C Weight: 117kg Age: 71 Scr: 1.21 on 08/04/21 CrCl: 77.62  Based on above findings Xarelto 20mg  daily is the appropriate dose.  Refill approved.

## 2021-10-05 ENCOUNTER — Encounter: Payer: Self-pay | Admitting: Cardiology

## 2021-10-05 ENCOUNTER — Other Ambulatory Visit (HOSPITAL_COMMUNITY): Payer: Self-pay

## 2021-10-05 ENCOUNTER — Ambulatory Visit (INDEPENDENT_AMBULATORY_CARE_PROVIDER_SITE_OTHER): Payer: 59 | Admitting: Cardiology

## 2021-10-05 VITALS — BP 118/70 | HR 66 | Ht 62.0 in | Wt 261.0 lb

## 2021-10-05 DIAGNOSIS — I480 Paroxysmal atrial fibrillation: Secondary | ICD-10-CM

## 2021-10-05 MED ORDER — FLECAINIDE ACETATE 100 MG PO TABS
100.0000 mg | ORAL_TABLET | Freq: Two times a day (BID) | ORAL | 1 refills | Status: DC
  Filled 2021-10-05: qty 180, 90d supply, fill #0
  Filled 2022-01-02: qty 180, 90d supply, fill #1

## 2021-10-05 NOTE — Progress Notes (Signed)
Electrophysiology Office Note   Date:  10/05/2021   ID:  ARYIAN VAILES, DOB 1950/12/08, MRN [id number]  PCP:  Josetta Huddle, MD  Cardiologist:  Johnsie Cancel Primary Electrophysiologist:  Dejanique Ruehl Meredith Leeds, MD    Chief Complaint: AF   History of Present Illness: [name] is a 71 y.o. female who is being seen today for the evaluation of AF at the request of Josue Hector, MD. Presenting today for electrophysiology evaluation.  She has a history significant for atrial fibrillation, hypertension, hyperlipidemia, obesity.  She wore a cardiac monitor that showed an 18% atrial fibrillation burden.  She was having trouble with gout and started prednisone, but had increased burden of atrial fibrillation.  She feels fluttering and dyspnea on exertion when she is in atrial fibrillation.  States that she developed worsening episodes of atrial fibrillation when she was taking prednisone for arthritis in her foot.  She states that it was initially thought that this was gout, but the prednisone did not help with the pain.  Today, she denies symptoms of palpitations, chest pain, shortness of breath, orthopnea, PND, lower extremity edema, claudication, dizziness, presyncope, syncope, bleeding, or neurologic sequela. The patient is tolerating medications without difficulties.    Past Medical History:  Diagnosis Date   A-fib ([place])    Anemia    hx of   Arthritis    Chest pain    Constipation    Depression    Diabetes mellitus ORAL MEDS   Diabetic retinopathy ([place])    Dyspnea    with minimal exertion-deconditioned   Dyspnea    Dysrhythmia    a-fib   Food allergy    GERD (gastroesophageal reflux disease)    occasional   Gout    History of kidney stones    Hypercholesteremia    Hyperlipidemia    Hypertension    Hypertensive kidney disease    Hypothyroidism    Knee pain, right    Left arm numbness DUE TO CERVICAL PINCHED NERVE   Obesity    OSA on CPAP    uses CPAP nightly    Osteoarthritis    Palpitations    Pinched nerve in neck    Pleurisy    Pneumonia    hx of   PONV (postoperative nausea and vomiting)    for 3-4 days after general anesthesia approx 10-12 years ago   Sciatica    Swelling of knee joint, right    Synovitis of knee RIGHT   Trigger finger, left    left index   Vitamin D deficiency    Past Surgical History:  Procedure Laterality Date   CESAREAN SECTION  X3   KNEE ARTHROSCOPY  04/20/2011   Procedure: ARTHROSCOPY KNEE;  Surgeon: Gearlean Alf, MD;  Location: [place];  Service: Orthopedics;  Laterality: Right;  WITH SYNOVECTOMY   LEFT CARPAL TUNNEL / LEFT MIDDLE & RING FINGER TRIGGER RELEASE  08-26-2008   LEFT SHOULDER ARTHROSCOPY W/ DEBRIDEMENT  09-09-2003   LEFT SHOULDER ARTHROSCOPY/ LEFT THUMB TRIGGER RELEASE  02-22-2005   PHOTOCOAGULATION WITH LASER Right 03/20/2018   Procedure: PHOTOCOAGULATION WITH LASER;  Surgeon: Jalene Mullet, MD;  Location: [place];  Service: Ophthalmology;  Laterality: Right;   PULLEY RELEASE LEFT LONG FINGER  07-14-2009   RADIAL HEAD ARTHROPLASTY Right 06/17/2018   Procedure: RADIAL HEAD ARTHROPLASTY;  Surgeon: Hiram Gash, MD;  Location: [place];  Service: Orthopedics;  Laterality: Right;   RIGHT CARPAL TUNNEL/ RIGHT THUMB TRIGGER RELEASE'S  11-28-2006  RIGHT SHOULDER ARTHROSCOPY W/ ROTATOR CUFF REPAIR  01-13-2004   SHOULDER ARTHROSCOPY DISTAL CLAVICLE EXCISION AND OPEN ROTATOR CUFF REPAIR  09-07-2004   LEFT   SHOULDER ARTHROSCOPY W/ ACROMIAL REPAIR  11-29-2005   LEFT   TONSILLECTOMY AND ADENOIDECTOMY  child   TOTAL KNEE ARTHROPLASTY  12-14-2009   RIGHT   TOTAL KNEE ARTHROPLASTY Left 11/05/2012   Procedure: LEFT TOTAL KNEE ARTHROPLASTY;  Surgeon: Loanne Drilling, MD;  [facility];  Service: Orthopedics;  Laterality: Left;   TOTAL KNEE REVISION Right 07/29/2016   Procedure: RIGHT KNEE POLY-LINER EXCHANGE;  Surgeon: Kathryne Hitch, MD;  [facility];  Service: Orthopedics;   Laterality: Right;   TRIGGER FINGER RELEASE Right 02/21/2013   Procedure: RIGHT RING A-1 PULLEY RELEASE    (MINOR PROCEDURE) ;  Surgeon: Wyn Forster., MD;  Location: [facility];  Service: Orthopedics;  Laterality: Right;   TRIGGER FINGER RELEASE Left 09/22/2016   Procedure: RELEASE TRIGGER FINGER LEFT INDEX FINGER;  Surgeon: Kathryne Hitch, MD;  Location[facility];  Service: Orthopedics;  Laterality: Left;   TRIGGER FINGER RELEASE Right 01/20/2017   Procedure: RELEASE TRIGGER FINGER/A-1 PULLEY RIGHT INDEX FINGER;  Surgeon: Marcene Corning, MD;  Location: [facility];  Service: Orthopedics;  Laterality: Right;   VITRECTOMY 25 GAUGE WITH SCLERAL BUCKLE Right 03/20/2018   Procedure: RIGHT EYE VITRECTOMY WITH  ENDOLASER PARENTAL PHOTOCOAGULATION 25 GAUGE;  Surgeon: Carmela Rima, MD;  [facility];  Service: Ophthalmology;  Laterality: Right;   WRIST ARTHROSCOPY WITH DEBRIDEMENT Right 05/01/2019   Procedure: WRIST ARTHROSCOPY WITH DEBRIDEMENT;  Surgeon: Dairl Ponder, MD;  Location: [facility];  Service: Orthopedics;  Laterality: Right;     Current Outpatient Medications  Medication Sig Dispense Refill   Aflibercept (EYLEA) 2 MG/0.05ML SOLN See admin instructions. Every six weeks     amLODipine (NORVASC) 5 MG tablet Take 1 tablet (5 mg total) by mouth daily. 90 tablet 3   amoxicillin (AMOXIL) 500 MG capsule Take 4 capsules (2,000 mg total) by mouth 1 hour prior to dental appointment 16 capsule 0   atorvastatin (LIPITOR) 40 MG tablet TAKE 1 TABLET BY MOUTH ONCE A DAY 90 tablet 3   cholecalciferol (VITAMIN D3) 25 MCG (1000 UT) tablet Take 1,000 Units by mouth daily.     Dapagliflozin-metFORMIN HCl ER (XIGDUO XR) 12-998 MG TB24 Take 1 tablet by mouth daily 90 tablet 3   Dulaglutide (TRULICITY) 4.5 MG/0.5ML SOPN Inject 4.5 mg (1 pen) subcutaneously once a week 2 mL 11   DULoxetine (CYMBALTA) 60 MG capsule Take 1 capsule (60 mg total) by  mouth daily. 90 capsule 3   flecainide (TAMBOCOR) 100 MG tablet Take 1 tablet (100 mg total) by mouth 2 (two) times daily. 180 tablet 1   glucose blood test strip Use to test blood sugars 3 times a day 300 each 4   hydrALAZINE (APRESOLINE) 25 MG tablet TAKE 1 TABLET BY MOUTH TWO TIMES DAILY WITH BREAKFAST AND DINNER 180 tablet 3   indomethacin (INDOCIN) 50 MG capsule TAKE 1 CAPSULE BY MOUTH TWO TO THREE TIMES DAILY AS NEEDED FOR GOUT PAIN 20 capsule 4   insulin glargine-yfgn (SEMGLEE, YFGN,) 100 UNIT/ML Pen Inject 70 units under the skin twice a day 120 mL 3   insulin glargine-yfgn (SEMGLEE, YFGN,) 100 UNIT/ML Pen Inject 70 units under the skin twice a day 135 mL 4   insulin glargine-yfgn (SEMGLEE, YFGN,) 100 UNIT/ML Pen Inject 75 units into the skin  twice a day for 90 days. 180 mL 4   Insulin Pen Needle (UNIFINE PENTIPS) 32G X 4 MM MISC Use to adminster insulin subcutaneously twice daily 200 each 3   levothyroxine (SYNTHROID) 137 MCG tablet Take 1 tablet by mouth daily in the morning on an empty stomach 90 tablet 3   losartan (COZAAR) 50 MG tablet TAKE 1 TABLET BY MOUTH ONCE A DAY 90 tablet 4   losartan (COZAAR) 50 MG tablet Take 1 tablet by mouth daily for 90 days 90 tablet 2   losartan (COZAAR) 50 MG tablet Take 1 tablet by mouth once a day 90 tablet 3   Melatonin 5 MG CAPS Take 5-10 mg by mouth at bedtime as needed (sleep).      metoprolol tartrate (LOPRESSOR) 50 MG tablet Take 1 tablet by mouth 2 times daily. 180 tablet 3   omeprazole (PRILOSEC) 20 MG capsule TAKE 1 CAPSULE BY MOUTH ONCE DAILY 90 capsule 3   rivaroxaban (XARELTO) 20 MG TABS tablet Take 1 tablet (20 mg total) by mouth daily with supper. 90 tablet 1   spironolactone (ALDACTONE) 25 MG tablet Take 0.5 tablets (12.5 mg total) by mouth every other day. 45 tablet 1   No current facility-administered medications for this visit.    Allergies:   Actos [pioglitazone hydrochloride], Avandia [rosiglitazone], Tetanus toxoids,  Pioglitazone, and Food   Social History:  The patient  reports that she quit smoking about 44 years ago. Her smoking use included cigarettes. She has never used smokeless tobacco. She reports that she does not drink alcohol and does not use drugs.   Family History:  The patient's family history includes Alcoholism in her father; Cancer in her father; Diabetes in her mother; Heart attack in her mother; Hypertension in her mother; Liver disease in her father; Obesity in her mother; Sleep apnea in her father; Thyroid disease in her mother.    ROS:  Please see the history of present illness.   Otherwise, review of systems is positive for none.   All other systems are reviewed and negative.    PHYSICAL EXAM: VS:  BP 118/70   Pulse 66   Ht 5\' 2"  (1.575 m)   Wt 261 lb (118.4 kg)   SpO2 99%   BMI 47.74 kg/m  , BMI Body mass index is 47.74 kg/m. GEN: Well nourished, well developed, in no acute distress  HEENT: normal  Neck: no JVD, carotid bruits, or masses Cardiac: RRR; no murmurs, rubs, or gallops,no edema  Respiratory:  clear to auscultation bilaterally, normal work of breathing GI: soft, nontender, nondistended, + BS MS: no deformity or atrophy  Skin: warm and dry Neuro:  Strength and sensation are intact Psych: euthymic mood, full affect  EKG:  EKG is ordered today. Personal review of the ekg ordered shows sinus rhythm  Recent Labs: 07/07/2021: ALT 17; Hemoglobin 12.3; NT-Pro BNP 334; Platelets 361; TSH 0.583 08/04/2021: BUN 31; Creatinine, Ser 1.21; Potassium 4.7; Sodium 143    Lipid Panel     Component Value Date/Time   CHOL 126 07/18/2017 1112   TRIG 98 07/18/2017 1112   HDL 43 07/18/2017 1112   LDLCALC 63 07/18/2017 1112     Wt Readings from Last 3 Encounters:  10/05/21 261 lb (118.4 kg)  07/21/21 258 lb (117 kg)  07/19/21 263 lb (119.3 kg)      Other studies Reviewed: Additional studies/ records that were reviewed today include: TTE 07/16/21  Review of the above  records today demonstrates:  1. Left ventricular ejection fraction, by estimation, is 55 to 60%. The  left ventricle has normal function. The left ventricle has no regional  wall motion abnormalities. There is mild left ventricular hypertrophy.  Left ventricular diastolic parameters  are consistent with Grade I diastolic dysfunction (impaired relaxation).   2. Right ventricular systolic function is normal. The right ventricular  size is normal. There is normal pulmonary artery systolic pressure. The  estimated right ventricular systolic pressure is [id number] mmHg.   3. Left atrial size was mildly dilated.   4. Right atrial size was mildly dilated.   5. The mitral valve is normal in structure. Trivial mitral valve  regurgitation. No evidence of mitral stenosis. Moderate mitral annular  calcification.   6. The aortic valve is tricuspid. There is mild calcification of the  aortic valve. Aortic valve regurgitation is not visualized. No aortic  stenosis is present.   7. The inferior vena cava is normal in size with greater than 50%  respiratory variability, suggesting right atrial pressure of 3 mmHg.   Cardiac monitor 09/28/2021 personally reviewed Predominant rhythm was atrial fibrillation 18% atrial fibrillation burden  ASSESSMENT AND PLAN:  1.  Paroxysmal atrial fibrillation: Currently on flecainide 50 mg twice daily, Xarelto 20 mg daily.  She unfortunately continues to have an elevated burden of atrial fibrillation.  She feels significant palpitations.  Due to her morbid obesity, we Anyeli Hockenbury hold off for now on ablation.  We Amadou Katzenstein increase her flecainide to 100 mg twice daily.  I Shritha Bresee have her follow-up in A-fib clinic in 2 weeks.  2.  Chronic diastolic heart failure: No obvious volume overload.  Plan per primary cardiology.  3.  Hypertension: Currently well controlled  4.  Hyperlipidemia: Continue atorvastatin per primary cardiology  5.  Secondary hypercoagulable state: Currently on Xarelto  for atrial fibrillation as above  6.  Obesity: Lifestyle modification encouraged Body mass index is 47.74 kg/m.  7.  Obstructive sleep apnea: CPAP compliance encouraged   Current medicines are reviewed at length with the patient today.   The patient does not have concerns regarding her medicines.  The following changes were made today:  increase flecainide  Labs/ tests ordered today include:  Orders Placed This Encounter  Procedures   EKG 12-Lead     Disposition:   [facility] A-fib clinic 2 weeks  Signed, Chistian Kasler Meredith Leeds, MD  10/05/2021 4:00 PM     [address] [place] [address] [phone] (office) [phone] (fax)

## 2021-10-05 NOTE — Patient Instructions (Signed)
Medication Instructions:  INCREASE Flecainide to 100mg  Take 1 tablet twice a day  *If you need a refill on your cardiac medications before your next appointment, please call your pharmacy*   Lab Work: None Ordered   Testing/Procedures: None Ordered   Follow-Up: At , you and your health needs are our priority.  As part of our continuing mission to provide you with exceptional heart care, we have created designated Provider Care Teams.  These Care Teams include your primary Cardiologist (physician) and Advanced Practice Providers (APPs -  Physician Assistants and Nurse Practitioners) who all work together to provide you with the care you need, when you need it.  We recommend signing up for the patient portal called "MyChart".  Sign up information is provided on this After Visit Summary.  MyChart is used to connect with patients for Virtual Visits (Telemedicine).  Patients are able to view lab/test results, encounter notes, upcoming appointments, etc.  Non-urgent messages can be sent to your provider as well.   To learn more about what you can do with MyChart, go to [employer].    Your next appointment:   2 week(s)  The format for your next appointment:   In Person  Provider:   You will follow up in the Atrial Fibrillation Clinic located at [facility]. Your provider will be: [facility], NP or Clint R. Fenton, PA-C    Other Instructions   Important Information About Sugar

## 2021-10-11 DIAGNOSIS — S63659A Sprain of metacarpophalangeal joint of unspecified finger, initial encounter: Secondary | ICD-10-CM | POA: Diagnosis not present

## 2021-10-11 DIAGNOSIS — G5621 Lesion of ulnar nerve, right upper limb: Secondary | ICD-10-CM | POA: Diagnosis not present

## 2021-10-20 ENCOUNTER — Other Ambulatory Visit (HOSPITAL_COMMUNITY): Payer: Self-pay

## 2021-10-20 ENCOUNTER — Encounter (INDEPENDENT_AMBULATORY_CARE_PROVIDER_SITE_OTHER): Payer: Self-pay

## 2021-10-21 ENCOUNTER — Other Ambulatory Visit (HOSPITAL_COMMUNITY): Payer: Self-pay

## 2021-10-22 ENCOUNTER — Encounter (HOSPITAL_COMMUNITY): Payer: Self-pay | Admitting: Physician Assistant

## 2021-10-22 ENCOUNTER — Ambulatory Visit (HOSPITAL_COMMUNITY)
Admission: RE | Admit: 2021-10-22 | Discharge: 2021-10-22 | Disposition: A | Discharge status: Home / Self Care | Payer: 59 | Source: Ambulatory Visit | Attending: Physician Assistant | Admitting: Physician Assistant

## 2021-10-22 VITALS — BP 136/70 | HR 62 | Ht 62.0 in | Wt 257.0 lb

## 2021-10-22 DIAGNOSIS — Z79899 Other long term (current) drug therapy: Secondary | ICD-10-CM | POA: Diagnosis not present

## 2021-10-22 DIAGNOSIS — I5032 Chronic diastolic (congestive) heart failure: Secondary | ICD-10-CM | POA: Insufficient documentation

## 2021-10-22 DIAGNOSIS — Z7901 Long term (current) use of anticoagulants: Secondary | ICD-10-CM | POA: Diagnosis not present

## 2021-10-22 DIAGNOSIS — I110 Hypertensive heart disease with heart failure: Secondary | ICD-10-CM | POA: Insufficient documentation

## 2021-10-22 DIAGNOSIS — E785 Hyperlipidemia, unspecified: Secondary | ICD-10-CM | POA: Diagnosis not present

## 2021-10-22 DIAGNOSIS — G4733 Obstructive sleep apnea (adult) (pediatric): Secondary | ICD-10-CM | POA: Diagnosis not present

## 2021-10-22 DIAGNOSIS — I480 Paroxysmal atrial fibrillation: Secondary | ICD-10-CM | POA: Insufficient documentation

## 2021-10-22 DIAGNOSIS — Z9989 Dependence on other enabling machines and devices: Secondary | ICD-10-CM | POA: Insufficient documentation

## 2021-10-22 DIAGNOSIS — E118 Type 2 diabetes mellitus with unspecified complications: Secondary | ICD-10-CM | POA: Insufficient documentation

## 2021-10-22 DIAGNOSIS — D6869 Other thrombophilia: Secondary | ICD-10-CM | POA: Diagnosis not present

## 2021-10-22 NOTE — Addendum Note (Signed)
Encounter addended by: Shona Simpson, RN on: 10/22/2021 10:48 AM  Actions taken: Order list changed, Diagnosis association updated

## 2021-10-22 NOTE — Progress Notes (Signed)
Primary Care Physician: Josetta Huddle, MD Primary Cardiologist: Dr Johnsie Cancel Primary Electrophysiologist: Dr Curt Bears  Referring Physician: Dr Wilma Flavin is a 71 y.o. female with a history of DM, chronic HFpEF, HLD, HTN, OSA, atrial fibrillation who presents for follow up in the [facility]. Seen by Roderic Palau remotely and has been maintained on flecainide. Patient is on Xarelto for a CHADS2VASC score of 5. She had a cardiac monitor placed 09/2021 which showed an increased burden of afib (18%). Seen by Dr Curt Bears 10/05/21 and her flecainide was increased.   On follow up today, patient reports that her afib episodes have greatly improved on the higher dose of flecainide. She still has a couple episodes per week but they only last about 10 minutes.   Today, she denies symptoms of chest pain, shortness of breath, orthopnea, PND, lower extremity edema, dizziness, presyncope, syncope, snoring, daytime somnolence, bleeding, or neurologic sequela. The patient is tolerating medications without difficulties and is otherwise without complaint today.    Atrial Fibrillation Risk Factors:  she does have symptoms or diagnosis of sleep apnea. she is compliant with CPAP therapy. she does not have a history of rheumatic fever.   she has a BMI of Body mass index is 47.01 kg/m.[name] Filed Weights   10/22/21 0958  Weight: 116.6 kg    Family History  Problem Relation Age of Onset   Diabetes Mother    Hypertension Mother    Heart attack Mother    Thyroid disease Mother    Obesity Mother    Cancer Father    Liver disease Father    Sleep apnea Father    Alcoholism Father      Atrial Fibrillation Management history:  Previous antiarrhythmic drugs: flecainide Previous cardioversions: none Previous ablations: none CHADS2VASC score: 5 Anticoagulation history: Xarelto   Past Medical History:  Diagnosis Date   A-fib ([place])    Anemia    hx of   Arthritis     Chest pain    Constipation    Depression    Diabetes mellitus ORAL MEDS   Diabetic retinopathy ([place])    Dyspnea    with minimal exertion-deconditioned   Dyspnea    Dysrhythmia    a-fib   Food allergy    GERD (gastroesophageal reflux disease)    occasional   Gout    History of kidney stones    Hypercholesteremia    Hyperlipidemia    Hypertension    Hypertensive kidney disease    Hypothyroidism    Knee pain, right    Left arm numbness DUE TO CERVICAL PINCHED NERVE   Obesity    OSA on CPAP    uses CPAP nightly   Osteoarthritis    Palpitations    Pinched nerve in neck    Pleurisy    Pneumonia    hx of   PONV (postoperative nausea and vomiting)    for 3-4 days after general anesthesia approx 10-12 years ago   Sciatica    Swelling of knee joint, right    Synovitis of knee RIGHT   Trigger finger, left    left index   Vitamin D deficiency    Past Surgical History:  Procedure Laterality Date   CESAREAN SECTION  X3   KNEE ARTHROSCOPY  04/20/2011   Procedure: ARTHROSCOPY KNEE;  Surgeon: Gearlean Alf, MD;  Location: [place];  Service: Orthopedics;  Laterality: Right;  WITH SYNOVECTOMY   LEFT CARPAL TUNNEL /  LEFT MIDDLE & RING FINGER TRIGGER RELEASE  08-26-2008   LEFT SHOULDER ARTHROSCOPY W/ DEBRIDEMENT  09-09-2003   LEFT SHOULDER ARTHROSCOPY/ LEFT THUMB TRIGGER RELEASE  02-22-2005   PHOTOCOAGULATION WITH LASER Right 03/20/2018   Procedure: PHOTOCOAGULATION WITH LASER;  Surgeon: Jalene Mullet, MD;  Location: [place];  Service: Ophthalmology;  Laterality: Right;   PULLEY RELEASE LEFT LONG FINGER  07-14-2009   RADIAL HEAD ARTHROPLASTY Right 06/17/2018   Procedure: RADIAL HEAD ARTHROPLASTY;  Surgeon: Hiram Gash, MD;  Location: [place];  Service: Orthopedics;  Laterality: Right;   RIGHT CARPAL TUNNEL/ RIGHT THUMB TRIGGER RELEASE'S  11-28-2006   RIGHT SHOULDER ARTHROSCOPY W/ ROTATOR CUFF REPAIR  01-13-2004   SHOULDER ARTHROSCOPY DISTAL CLAVICLE EXCISION AND  OPEN ROTATOR CUFF REPAIR  09-07-2004   LEFT   SHOULDER ARTHROSCOPY W/ ACROMIAL REPAIR  11-29-2005   LEFT   TONSILLECTOMY AND ADENOIDECTOMY  child   TOTAL KNEE ARTHROPLASTY  12-14-2009   RIGHT   TOTAL KNEE ARTHROPLASTY Left 11/05/2012   Procedure: LEFT TOTAL KNEE ARTHROPLASTY;  Surgeon: Gearlean Alf, MD;  Location: [facility];  Service: Orthopedics;  Laterality: Left;   TOTAL KNEE REVISION Right 07/29/2016   Procedure: RIGHT KNEE POLY-LINER EXCHANGE;  Surgeon: Mcarthur Rossetti, MD;  Location: [facility];  Service: Orthopedics;  Laterality: Right;   TRIGGER FINGER RELEASE Right 02/21/2013   Procedure: RIGHT RING A-1 PULLEY RELEASE    (MINOR PROCEDURE) ;  Surgeon: Cammie Sickle., MD;  Location: [facility];  Service: Orthopedics;  Laterality: Right;   TRIGGER FINGER RELEASE Left 09/22/2016   Procedure: RELEASE TRIGGER FINGER LEFT INDEX FINGER;  Surgeon: Mcarthur Rossetti, MD;  Location: [place];  Service: Orthopedics;  Laterality: Left;   TRIGGER FINGER RELEASE Right 01/20/2017   Procedure: RELEASE TRIGGER FINGER/A-1 PULLEY RIGHT INDEX FINGER;  Surgeon: Melrose Nakayama, MD;  Location: [place];  Service: Orthopedics;  Laterality: Right;   VITRECTOMY 25 GAUGE WITH SCLERAL BUCKLE Right 03/20/2018   Procedure: RIGHT EYE VITRECTOMY WITH  ENDOLASER PARENTAL PHOTOCOAGULATION 25 GAUGE;  Surgeon: Jalene Mullet, MD;  Location: [place];  Service: Ophthalmology;  Laterality: Right;   WRIST ARTHROSCOPY WITH DEBRIDEMENT Right 05/01/2019   Procedure: WRIST ARTHROSCOPY WITH DEBRIDEMENT;  Surgeon: Charlotte Crumb, MD;  Location: [place];  Service: Orthopedics;  Laterality: Right;    Current Outpatient Medications  Medication Sig Dispense Refill   Aflibercept (EYLEA) 2 MG/0.05ML SOLN See admin instructions. Every six weeks     amLODipine (NORVASC) 5 MG tablet Take 1 tablet (5 mg total) by mouth daily. 90 tablet 3   amoxicillin (AMOXIL) 500 MG capsule Take  4 capsules (2,000 mg total) by mouth 1 hour prior to dental appointment 16 capsule 0   atorvastatin (LIPITOR) 40 MG tablet TAKE 1 TABLET BY MOUTH ONCE A DAY 90 tablet 3   cholecalciferol (VITAMIN D3) 25 MCG (1000 UT) tablet Take 1,000 Units by mouth daily.     Dapagliflozin-metFORMIN HCl ER (XIGDUO XR) 12-998 MG TB24 Take 1 tablet by mouth daily 90 tablet 3   Dulaglutide (TRULICITY) 4.5 0000000 SOPN Inject 4.5 mg (1 pen) subcutaneously once a week 2 mL 11   DULoxetine (CYMBALTA) 60 MG capsule Take 1 capsule (60 mg total) by mouth daily. 90 capsule 3   flecainide (TAMBOCOR) 100 MG tablet Take 1 tablet (100 mg total) by mouth 2 (two) times daily. 180 tablet 1   glucose blood test strip Use to test blood sugars 3 times a day  300 each 4   hydrALAZINE (APRESOLINE) 25 MG tablet TAKE 1 TABLET BY MOUTH TWO TIMES DAILY WITH BREAKFAST AND DINNER 180 tablet 3   indomethacin (INDOCIN) 50 MG capsule TAKE 1 CAPSULE BY MOUTH TWO TO THREE TIMES DAILY AS NEEDED FOR GOUT PAIN 20 capsule 4   insulin glargine-yfgn (SEMGLEE, YFGN,) 100 UNIT/ML Pen Inject 70 units under the skin twice a day 120 mL 3   insulin glargine-yfgn (SEMGLEE, YFGN,) 100 UNIT/ML Pen Inject 70 units under the skin twice a day 135 mL 4   insulin glargine-yfgn (SEMGLEE, YFGN,) 100 UNIT/ML Pen Inject 75 units into the skin twice a day for 90 days. 180 mL 4   Insulin Pen Needle (UNIFINE PENTIPS) 32G X 4 MM MISC Use to adminster insulin subcutaneously twice daily 200 each 3   levothyroxine (SYNTHROID) 137 MCG tablet Take 1 tablet by mouth daily in the morning on an empty stomach 90 tablet 3   losartan (COZAAR) 50 MG tablet Take 1 tablet by mouth daily for 90 days 90 tablet 2   losartan (COZAAR) 50 MG tablet Take 1 tablet by mouth once a day 90 tablet 3   Melatonin 5 MG CAPS Take 5-10 mg by mouth at bedtime as needed (sleep).      metoprolol tartrate (LOPRESSOR) 50 MG tablet Take 1 tablet by mouth 2 times daily. 180 tablet 3   omeprazole (PRILOSEC) 20  MG capsule TAKE 1 CAPSULE BY MOUTH ONCE DAILY 90 capsule 3   rivaroxaban (XARELTO) 20 MG TABS tablet Take 1 tablet (20 mg total) by mouth daily with supper. 90 tablet 1   spironolactone (ALDACTONE) 25 MG tablet Take 1/2 tablet mouth every other day. 45 tablet 1   losartan (COZAAR) 50 MG tablet TAKE 1 TABLET BY MOUTH ONCE A DAY 90 tablet 4   No current facility-administered medications for this encounter.    Allergies  Allergen Reactions   Actos [Pioglitazone Hydrochloride] Swelling and Other (See Comments)    Numbness in lips, HEADACHES SWELLING REACTION UNSPECIFIED    Avandia [Rosiglitazone] Swelling    Numbness in lips, headaches  SWELLING REACTION UNSPECIFIED    Tetanus Toxoids Other (See Comments)    Arm swelling, fainted, ?Respiratory distress  (horse serum)   Pioglitazone     Other reaction(s): Edema NDC [id number] NDC [id number] NDC [id number]:[id number]    Food Nausea Only    Eggplant    Social History   Socioeconomic History   Marital status: Single    Spouse name: Not on file   Number of children: Not on file   Years of education: Not on file   Highest education level: Not on file  Occupational History   Not on file  Tobacco Use   Smoking status: Former    Years: 5.00    Types: Cigarettes    Quit date: 04/12/1977    Years since quitting: 44.5   Smokeless tobacco: Never   Tobacco comments:    Former smoker 10/22/21  Vaping Use   Vaping Use: Never used  Substance and Sexual Activity   Alcohol use: No   Drug use: No   Sexual activity: Not on file  Other Topics Concern   Not on file  Social History Narrative   Not on file   Social Determinants of Health   Financial Resource Strain: Not on file  Food Insecurity: Not on file  Transportation Needs: Not on file  Physical Activity: Not on file  Stress: Not on file  Social Connections: Not on  file  Intimate Partner Violence: Not on file     ROS- All systems are reviewed and negative except  as per the HPI above.  Physical Exam: Vitals:   10/22/21 0958  BP: 136/70  Pulse: 62  Weight: 116.6 kg  Height: 5\' 2"  (1.575 m)    GEN- The patient is a well appearing obese female, alert and oriented x 3 today.   Head- normocephalic, atraumatic Eyes-  Sclera clear, conjunctiva pink Ears- hearing intact Oropharynx- clear Neck- supple  Lungs- Clear to ausculation bilaterally, normal work of breathing Heart- Regular rate and rhythm, no murmurs, rubs or gallops  GI- soft, NT, ND, + BS Extremities- no clubbing, cyanosis, or edema MS- no significant deformity or atrophy Skin- no rash or lesion Psych- euthymic mood, full affect Neuro- strength and sensation are intact  Wt Readings from Last 3 Encounters:  10/22/21 116.6 kg  10/05/21 118.4 kg  07/21/21 117 kg    EKG today demonstrates  SR, 1st degree AV block Vent. rate 62 BPM PR interval 206 ms QRS duration 108 ms QT/QTcB 456/462 ms  Echo 07/19/21 demonstrated   1. Left ventricular ejection fraction, by estimation, is 55 to 60%. The  left ventricle has normal function. The left ventricle has no regional  wall motion abnormalities. There is mild left ventricular hypertrophy.  Left ventricular diastolic parameters are consistent with Grade I diastolic dysfunction (impaired relaxation).   2. Right ventricular systolic function is normal. The right ventricular  size is normal. There is normal pulmonary artery systolic pressure. The  estimated right ventricular systolic pressure is 29.2 mmHg.   3. Left atrial size was mildly dilated.   4. Right atrial size was mildly dilated.   5. The mitral valve is normal in structure. Trivial mitral valve  regurgitation. No evidence of mitral stenosis. Moderate mitral annular  calcification.   6. The aortic valve is tricuspid. There is mild calcification of the  aortic valve. Aortic valve regurgitation is not visualized. No aortic  stenosis is present.   7. The inferior vena cava is  normal in size with greater than 50%  respiratory variability, suggesting right atrial pressure of 3 mmHg.   Epic records are reviewed at length today  CHA2DS2-VASc Score = 5  The patient's score is based upon: CHF History: 1 HTN History: 1 Diabetes History: 1 Stroke History: 0 Vascular Disease History: 0 Age Score: 1 Gender Score: 1       ASSESSMENT AND PLAN: 1. Paroxysmal Atrial Fibrillation (ICD10:  I48.0) The patient's CHA2DS2-VASc score is 5, indicating a 7.2% annual risk of stroke.   Patient symptoms are much improved but not resolved. We briefly discussed changing AAD (dofetilide). She would like to continue on the higher dose of flecainide for now and if she has an increased burden again then she would consider dofetilide admission.  Continue flecainide 100 mg BID Continue Lopressor 50 mg BID  2. Secondary Hypercoagulable State (ICD10:  D68.69) The patient is at significant risk for stroke/thromboembolism based upon her CHA2DS2-VASc Score of 5.  Continue Rivaroxaban (Xarelto).   3. Obesity Body mass index is 47.01 kg/m. Lifestyle modification was discussed at length including regular exercise and weight reduction. Will refer to [facility] PREP  4. Obstructive sleep apnea The importance of adequate treatment of sleep apnea was discussed today in order to improve our ability to maintain sinus rhythm long term. Encouraged compliance with CPAP therapy.  5. HTN Stable, no changes today.  6. Chronic HFpEF Fluid status appears stable  today.    Follow up in the AF clinic in 3 months.    Jorja Loa PA-C [facility] [address] [place], [place] [address] [phone] 10/22/2021 10:23 AM

## 2021-10-25 DIAGNOSIS — R202 Paresthesia of skin: Secondary | ICD-10-CM | POA: Diagnosis not present

## 2021-10-25 DIAGNOSIS — R200 Anesthesia of skin: Secondary | ICD-10-CM | POA: Diagnosis not present

## 2021-10-25 DIAGNOSIS — G5621 Lesion of ulnar nerve, right upper limb: Secondary | ICD-10-CM | POA: Diagnosis not present

## 2021-10-25 DIAGNOSIS — E113512 Type 2 diabetes mellitus with proliferative diabetic retinopathy with macular edema, left eye: Secondary | ICD-10-CM | POA: Diagnosis not present

## 2021-10-25 DIAGNOSIS — S63659A Sprain of metacarpophalangeal joint of unspecified finger, initial encounter: Secondary | ICD-10-CM | POA: Diagnosis not present

## 2021-10-28 ENCOUNTER — Other Ambulatory Visit (HOSPITAL_COMMUNITY): Payer: Self-pay

## 2021-10-29 ENCOUNTER — Telehealth: Payer: Self-pay

## 2021-10-29 ENCOUNTER — Other Ambulatory Visit (HOSPITAL_COMMUNITY): Payer: Self-pay

## 2021-10-29 NOTE — Telephone Encounter (Signed)
Called to discuss PREP program referrral, left voicemail

## 2021-11-01 ENCOUNTER — Other Ambulatory Visit (HOSPITAL_COMMUNITY): Payer: Self-pay

## 2021-11-02 ENCOUNTER — Telehealth: Payer: Self-pay

## 2021-11-02 ENCOUNTER — Other Ambulatory Visit (HOSPITAL_COMMUNITY): Payer: Self-pay

## 2021-11-02 NOTE — Telephone Encounter (Signed)
Returned my call, explained PREP, she wants to attend, needs afternoon class so wants Oct 30 M/W at [facility]; will ask Pam RN [facility] to contact her this fall for confirmation and setting up initial visit.

## 2021-11-02 NOTE — Telephone Encounter (Signed)
Returned her call, re PREP program, left voicemail

## 2021-11-03 ENCOUNTER — Other Ambulatory Visit (HOSPITAL_COMMUNITY): Payer: Self-pay

## 2021-11-04 ENCOUNTER — Other Ambulatory Visit (HOSPITAL_COMMUNITY): Payer: Self-pay

## 2021-11-08 ENCOUNTER — Other Ambulatory Visit (HOSPITAL_COMMUNITY): Payer: Self-pay

## 2021-11-08 DIAGNOSIS — S63659A Sprain of metacarpophalangeal joint of unspecified finger, initial encounter: Secondary | ICD-10-CM | POA: Diagnosis not present

## 2021-11-08 DIAGNOSIS — M24131 Other articular cartilage disorders, right wrist: Secondary | ICD-10-CM | POA: Diagnosis not present

## 2021-11-08 DIAGNOSIS — G5621 Lesion of ulnar nerve, right upper limb: Secondary | ICD-10-CM | POA: Diagnosis not present

## 2021-11-18 ENCOUNTER — Other Ambulatory Visit (HOSPITAL_COMMUNITY): Payer: Self-pay

## 2021-11-18 MED ORDER — LOSARTAN POTASSIUM 50 MG PO TABS
50.0000 mg | ORAL_TABLET | Freq: Every day | ORAL | 2 refills | Status: DC
  Filled 2021-11-18 – 2022-05-24 (×4): qty 90, 90d supply, fill #0
  Filled 2022-08-23: qty 90, 90d supply, fill #1

## 2021-11-19 ENCOUNTER — Other Ambulatory Visit (HOSPITAL_COMMUNITY): Payer: Self-pay

## 2021-11-22 DIAGNOSIS — E113511 Type 2 diabetes mellitus with proliferative diabetic retinopathy with macular edema, right eye: Secondary | ICD-10-CM | POA: Diagnosis not present

## 2021-11-29 DIAGNOSIS — E113512 Type 2 diabetes mellitus with proliferative diabetic retinopathy with macular edema, left eye: Secondary | ICD-10-CM | POA: Diagnosis not present

## 2021-12-01 DIAGNOSIS — H35033 Hypertensive retinopathy, bilateral: Secondary | ICD-10-CM | POA: Diagnosis not present

## 2021-12-01 DIAGNOSIS — H31093 Other chorioretinal scars, bilateral: Secondary | ICD-10-CM | POA: Diagnosis not present

## 2021-12-01 DIAGNOSIS — H34832 Tributary (branch) retinal vein occlusion, left eye, with macular edema: Secondary | ICD-10-CM | POA: Diagnosis not present

## 2021-12-01 DIAGNOSIS — Z961 Presence of intraocular lens: Secondary | ICD-10-CM | POA: Diagnosis not present

## 2021-12-01 DIAGNOSIS — E113513 Type 2 diabetes mellitus with proliferative diabetic retinopathy with macular edema, bilateral: Secondary | ICD-10-CM | POA: Diagnosis not present

## 2021-12-01 DIAGNOSIS — H3582 Retinal ischemia: Secondary | ICD-10-CM | POA: Diagnosis not present

## 2021-12-02 DIAGNOSIS — E1165 Type 2 diabetes mellitus with hyperglycemia: Secondary | ICD-10-CM | POA: Diagnosis not present

## 2021-12-02 DIAGNOSIS — E039 Hypothyroidism, unspecified: Secondary | ICD-10-CM | POA: Diagnosis not present

## 2021-12-02 DIAGNOSIS — Z87442 Personal history of urinary calculi: Secondary | ICD-10-CM | POA: Diagnosis not present

## 2021-12-02 DIAGNOSIS — Z794 Long term (current) use of insulin: Secondary | ICD-10-CM | POA: Diagnosis not present

## 2021-12-02 DIAGNOSIS — M109 Gout, unspecified: Secondary | ICD-10-CM | POA: Diagnosis not present

## 2021-12-02 DIAGNOSIS — M85851 Other specified disorders of bone density and structure, right thigh: Secondary | ICD-10-CM | POA: Diagnosis not present

## 2021-12-06 ENCOUNTER — Other Ambulatory Visit (HOSPITAL_COMMUNITY): Payer: Self-pay

## 2021-12-07 ENCOUNTER — Other Ambulatory Visit (HOSPITAL_COMMUNITY): Payer: Self-pay

## 2021-12-15 ENCOUNTER — Other Ambulatory Visit (HOSPITAL_COMMUNITY): Payer: Self-pay

## 2021-12-15 ENCOUNTER — Ambulatory Visit (INDEPENDENT_AMBULATORY_CARE_PROVIDER_SITE_OTHER): Payer: 59 | Admitting: Podiatry

## 2021-12-15 DIAGNOSIS — M7751 Other enthesopathy of right foot: Secondary | ICD-10-CM

## 2021-12-15 DIAGNOSIS — M7752 Other enthesopathy of left foot: Secondary | ICD-10-CM | POA: Diagnosis not present

## 2021-12-15 DIAGNOSIS — Q667 Congenital pes cavus, unspecified foot: Secondary | ICD-10-CM

## 2021-12-15 DIAGNOSIS — Q6671 Congenital pes cavus, right foot: Secondary | ICD-10-CM

## 2021-12-15 DIAGNOSIS — Q6672 Congenital pes cavus, left foot: Secondary | ICD-10-CM | POA: Diagnosis not present

## 2021-12-15 NOTE — Progress Notes (Signed)
Subjective:  Patient ID: [name], female    DOB: February 24, 1951,  MRN: [id number]  Chief Complaint  Patient presents with   Foot Pain    71 y.o. female presents with the above complaint.  Patient presents with left ankle pain.  Patient states that hurts with ambulation and hurts with pressure pain scale is 5 out of 10.  She is constantly on her foot.  She works at the hospital.  She wanted get it evaluated.  She has not seen anyone else prior to seeing me for this.  She does not wear any kind of bracing.  Pain is dull achy in nature   Review of Systems: Negative except as noted in the HPI. Denies N/V/F/Ch.  Past Medical History:  Diagnosis Date   A-fib ([facility])    Anemia    hx of   Arthritis    Chest pain    Constipation    Depression    Diabetes mellitus ORAL MEDS   Diabetic retinopathy ([facility])    Dyspnea    with minimal exertion-deconditioned   Dyspnea    Dysrhythmia    a-fib   Food allergy    GERD (gastroesophageal reflux disease)    occasional   Gout    History of kidney stones    Hypercholesteremia    Hyperlipidemia    Hypertension    Hypertensive kidney disease    Hypothyroidism    Knee pain, right    Left arm numbness DUE TO CERVICAL PINCHED NERVE   Obesity    OSA on CPAP    uses CPAP nightly   Osteoarthritis    Palpitations    Pinched nerve in neck    Pleurisy    Pneumonia    hx of   PONV (postoperative nausea and vomiting)    for 3-4 days after general anesthesia approx 10-12 years ago   Sciatica    Swelling of knee joint, right    Synovitis of knee RIGHT   Trigger finger, left    left index   Vitamin D deficiency     Current Outpatient Medications:    Aflibercept (EYLEA) 2 MG/0.05ML SOLN, See admin instructions. Every six weeks, Disp: , Rfl:    amLODipine (NORVASC) 5 MG tablet, Take 1 tablet (5 mg total) by mouth daily., Disp: 90 tablet, Rfl: 3   amoxicillin (AMOXIL) 500 MG capsule, Take 4 capsules (2,000 mg total) by mouth 1 hour prior to  dental appointment, Disp: 16 capsule, Rfl: 0   atorvastatin (LIPITOR) 40 MG tablet, Take 1 tablet (40 mg total) by mouth daily., Disp: 90 tablet, Rfl: 3   cholecalciferol (VITAMIN D3) 25 MCG (1000 UT) tablet, Take 1,000 Units by mouth daily., Disp: , Rfl:    Dapagliflozin-metFORMIN HCl ER (XIGDUO XR) 12-998 MG TB24, Take 1 tablet by mouth daily, Disp: 90 tablet, Rfl: 3   Dulaglutide (TRULICITY) 4.5 MG/0.5ML SOPN, Inject 4.5 mg into the skin once a week., Disp: 2 mL, Rfl: 11   DULoxetine (CYMBALTA) 60 MG capsule, Take 1 capsule (60 mg total) by mouth daily., Disp: 90 capsule, Rfl: 3   flecainide (TAMBOCOR) 100 MG tablet, Take 1 tablet (100 mg total) by mouth 2 (two) times daily., Disp: 180 tablet, Rfl: 1   glucose blood test strip, Use to test blood sugars 3 times a day, Disp: 300 each, Rfl: 4   hydrALAZINE (APRESOLINE) 25 MG tablet, TAKE 1 TABLET BY MOUTH TWO TIMES DAILY WITH BREAKFAST AND DINNER, Disp: 180 tablet, Rfl: 3  indomethacin (INDOCIN) 50 MG capsule, TAKE 1 CAPSULE BY MOUTH TWO TO THREE TIMES DAILY AS NEEDED FOR GOUT PAIN, Disp: 20 capsule, Rfl: 4   insulin glargine-yfgn (SEMGLEE, YFGN,) 100 UNIT/ML Pen, Inject 70 units under the skin twice a day, Disp: 120 mL, Rfl: 3   insulin glargine-yfgn (SEMGLEE, YFGN,) 100 UNIT/ML Pen, Inject 70 units under the skin twice a day, Disp: 135 mL, Rfl: 4   insulin glargine-yfgn (SEMGLEE, YFGN,) 100 UNIT/ML Pen, Inject 75 units into the skin twice a day for 90 days., Disp: 180 mL, Rfl: 4   Insulin Pen Needle (UNIFINE PENTIPS) 32G X 4 MM MISC, Use to adminster insulin subcutaneously twice daily, Disp: 200 each, Rfl: 3   levothyroxine (SYNTHROID) 137 MCG tablet, Take 1 tablet by mouth daily in the morning on an empty stomach, Disp: 90 tablet, Rfl: 3   losartan (COZAAR) 50 MG tablet, TAKE 1 TABLET BY MOUTH ONCE A DAY, Disp: 90 tablet, Rfl: 4   losartan (COZAAR) 50 MG tablet, Take 1 tablet (50 mg total) by mouth daily., Disp: 90 tablet, Rfl: 2   losartan  (COZAAR) 50 MG tablet, Take 1 tablet by mouth once a day, Disp: 90 tablet, Rfl: 3   losartan (COZAAR) 50 MG tablet, Take 1 tablet (50 mg total) by mouth daily., Disp: 90 tablet, Rfl: 2   Melatonin 5 MG CAPS, Take 5-10 mg by mouth at bedtime as needed (sleep). , Disp: , Rfl:    metoprolol tartrate (LOPRESSOR) 50 MG tablet, Take 1 tablet by mouth 2 times daily., Disp: 180 tablet, Rfl: 3   omeprazole (PRILOSEC) 20 MG capsule, TAKE 1 CAPSULE BY MOUTH ONCE DAILY, Disp: 90 capsule, Rfl: 3   rivaroxaban (XARELTO) 20 MG TABS tablet, Take 1 tablet (20 mg total) by mouth daily with supper., Disp: 90 tablet, Rfl: 1   spironolactone (ALDACTONE) 25 MG tablet, Take 1/2 tablet mouth every other day., Disp: 45 tablet, Rfl: 1  Social History   Tobacco Use  Smoking Status Former   Years: 5.00   Types: Cigarettes   Quit date: 04/12/1977   Years since quitting: 44.7  Smokeless Tobacco Never  Tobacco Comments   Former smoker 10/22/21    Allergies  Allergen Reactions   Actos [Pioglitazone Hydrochloride] Swelling and Other (See Comments)    Numbness in lips, HEADACHES SWELLING REACTION UNSPECIFIED    Avandia [Rosiglitazone] Swelling    Numbness in lips, headaches  SWELLING REACTION UNSPECIFIED    Tetanus Toxoids Other (See Comments)    Arm swelling, fainted, ?Respiratory distress  (horse serum)   Pioglitazone     Other reaction(s): Edema NDC [id number] NDC [id number] NDC DGLO:[id number]    Food Nausea Only    Eggplant   Objective:  There were no vitals filed for this visit. There is no height or weight on file to calculate BMI. Constitutional Well developed. Well nourished.  Vascular Dorsalis pedis pulses palpable bilaterally. Posterior tibial pulses palpable bilaterally. Capillary refill normal to all digits.  No cyanosis or clubbing noted. Pedal hair growth normal.  Neurologic Normal speech. Oriented to person, place, and time. Epicritic sensation to light touch grossly  present bilaterally.  Dermatologic Nails well groomed and normal in appearance. No open wounds. No skin lesions.  Orthopedic: Pain on palpation to the left ankle.  Pain with range of motion of the ankle joint no deep intra-articular pain noted.  No pain at the Achilles tendon peroneal tendon posterior tibial tendon.   Radiographs: None Assessment:   1. Capsulitis of  ankle, left   2. Pes cavus    Plan:  Patient was evaluated and treated and all questions answered.  Left ankle capsulitis with underlying pes cavus deformity -All questions and concerns were discussed with the patient in extensive detail given the amount of pain that she is having she will benefit from a steroid injection to help decrease acute inflammatory component associate with pain.  She will also benefit from Tri-Lock ankle brace.  If there is no improvement with the injection bracing we will discuss immobilization. -A steroid injection was performed at left ankle joint using 1% plain Lidocaine and 10 mg of Kenalog. This was well tolerated.   Pes cavus -I explained to patient the etiology of pes this and relationship with Planter fasciitis/arch pain and various treatment options were discussed.  Given patient foot structure in the setting of Planter fasciitis/arch pain I believe patient will benefit from custom-made orthotics to help control the hindfoot motion support the arch of the foot and take the stress away from plantar fascial.  Patient agrees with the plan like to proceed with orthotics -Patient was casted for orthotics   No follow-ups on file.

## 2021-12-16 ENCOUNTER — Other Ambulatory Visit (HOSPITAL_COMMUNITY): Payer: Self-pay

## 2021-12-16 MED ORDER — TRULICITY 4.5 MG/0.5ML ~~LOC~~ SOAJ
4.5000 mg | SUBCUTANEOUS | 11 refills | Status: DC
  Filled 2021-12-16: qty 2, 28d supply, fill #0
  Filled 2022-01-09: qty 2, 28d supply, fill #1
  Filled 2022-02-07: qty 2, 28d supply, fill #2
  Filled 2022-03-16: qty 2, 28d supply, fill #3
  Filled 2022-04-18: qty 2, 28d supply, fill #4
  Filled 2022-05-18: qty 2, 28d supply, fill #5
  Filled 2022-06-15: qty 2, 28d supply, fill #6
  Filled 2022-07-12: qty 2, 28d supply, fill #7
  Filled 2022-09-20: qty 2, 28d supply, fill #8
  Filled 2022-11-09: qty 2, 28d supply, fill #9
  Filled 2022-12-07: qty 2, 28d supply, fill #10

## 2021-12-16 MED ORDER — ATORVASTATIN CALCIUM 40 MG PO TABS
40.0000 mg | ORAL_TABLET | Freq: Every day | ORAL | 3 refills | Status: DC
  Filled 2021-12-16: qty 90, 90d supply, fill #0
  Filled 2022-03-16: qty 90, 90d supply, fill #1
  Filled 2022-06-15: qty 90, 90d supply, fill #2
  Filled 2022-09-09: qty 90, 90d supply, fill #3

## 2021-12-17 ENCOUNTER — Telehealth: Payer: Self-pay

## 2021-12-17 NOTE — Telephone Encounter (Signed)
VMT pt requesting call back to discuss starting PREP on 12/28/21

## 2021-12-20 NOTE — Progress Notes (Deleted)
Cardiology Office Note   Date:  12/20/2021   ID:  [name], DOB [birth year], MRN [id number]  PCP:  Charlane Ferretti, MD  Cardiologist:   [name], MD   No chief complaint on file.      History of Present Illness: [name] is a 71 y.o. female who presents for f/u of PAF. She has been Rx with flecainide, xarelto and beta blocker  TTE March 2018 and 2019  normal EF and normal LA size. Normal myovue 07/21/16  Has had multiple orthopedic surgeries last  2 years and held NOAC with no issues Also lumbar spine injections CHADVASC 4 no bleeding issues   She injured her back and left foot. Weight is up and DM poorly controlled Weight loss doctor wanted to start Wellbutrin but interacts with flecainide I told her we could lower dose and monitor ECG if she needed to use this medicine as her weight is a bigger health issue than her PAF at this time as she has maintained NSR for a while   Her last surgery with on right wrist with Dr Burney Gauze 05/01/19 no issues holding xarelto for 48 hours Has also had  Two different eye surgeries and hurt her elbow  Started on Cymbalta 08/18/20 Flecainide dose decreased to 50 mg bid due to CYP interaction  Seen by [facility] 10/05/21 and ablation deferred due to obesity He did increase her flecainide to 100 mg bid due to increased PAF burden and palpitations She had 18% burden of PAF on monitor 09/28/21 prior to increasing her dose ETT 11/17/20 no QRS widening Myovue EF 57% no ischemia Mention of small defect in apex and inferior lateral wall but likely artifact TTE 07/19/21 EF 55-60% mild bi atrial enlargement no significant valve dx  ***  Past Medical History:  Diagnosis Date   A-fib ([place])    Anemia    hx of   Arthritis    Chest pain    Constipation    Depression    Diabetes mellitus ORAL MEDS   Diabetic retinopathy ([place])    Dyspnea    with minimal exertion-deconditioned   Dyspnea    Dysrhythmia    a-fib   Food allergy    GERD (gastroesophageal  reflux disease)    occasional   Gout    History of kidney stones    Hypercholesteremia    Hyperlipidemia    Hypertension    Hypertensive kidney disease    Hypothyroidism    Knee pain, right    Left arm numbness DUE TO CERVICAL PINCHED NERVE   Obesity    OSA on CPAP    uses CPAP nightly   Osteoarthritis    Palpitations    Pinched nerve in neck    Pleurisy    Pneumonia    hx of   PONV (postoperative nausea and vomiting)    for 3-4 days after general anesthesia approx 10-12 years ago   Sciatica    Swelling of knee joint, right    Synovitis of knee RIGHT   Trigger finger, left    left index   Vitamin D deficiency     Past Surgical History:  Procedure Laterality Date   CESAREAN SECTION  X3   KNEE ARTHROSCOPY  04/20/2011   Procedure: ARTHROSCOPY KNEE;  Surgeon: Gearlean Alf, MD;  Location: [place];  Service: Orthopedics;  Laterality: Right;  WITH SYNOVECTOMY   LEFT CARPAL TUNNEL / LEFT MIDDLE & RING FINGER TRIGGER RELEASE  08-26-2008   LEFT SHOULDER ARTHROSCOPY W/ DEBRIDEMENT  09-09-2003   LEFT SHOULDER ARTHROSCOPY/ LEFT THUMB TRIGGER RELEASE  02-22-2005   PHOTOCOAGULATION WITH LASER Right 03/20/2018   Procedure: PHOTOCOAGULATION WITH LASER;  Surgeon: Jalene Mullet, MD;  Location: [place];  Service: Ophthalmology;  Laterality: Right;   PULLEY RELEASE LEFT LONG FINGER  07-14-2009   RADIAL HEAD ARTHROPLASTY Right 06/17/2018   Procedure: RADIAL HEAD ARTHROPLASTY;  Surgeon: Hiram Gash, MD;  Location: [place];  Service: Orthopedics;  Laterality: Right;   RIGHT CARPAL TUNNEL/ RIGHT THUMB TRIGGER RELEASE'S  11-28-2006   RIGHT SHOULDER ARTHROSCOPY W/ ROTATOR CUFF REPAIR  01-13-2004   SHOULDER ARTHROSCOPY DISTAL CLAVICLE EXCISION AND OPEN ROTATOR CUFF REPAIR  09-07-2004   LEFT   SHOULDER ARTHROSCOPY W/ ACROMIAL REPAIR  11-29-2005   LEFT   TONSILLECTOMY AND ADENOIDECTOMY  child   TOTAL KNEE ARTHROPLASTY  12-14-2009   RIGHT   TOTAL KNEE ARTHROPLASTY Left 11/05/2012    Procedure: LEFT TOTAL KNEE ARTHROPLASTY;  Surgeon: Gearlean Alf, MD;  [facility];  Service: Orthopedics;  Laterality: Left;   TOTAL KNEE REVISION Right 07/29/2016   Procedure: RIGHT KNEE POLY-LINER EXCHANGE;  Surgeon: Mcarthur Rossetti, MD;  [facility];  Service: Orthopedics;  Laterality: Right;   TRIGGER FINGER RELEASE Right 02/21/2013   Procedure: RIGHT RING A-1 PULLEY RELEASE    (MINOR PROCEDURE) ;  Surgeon: Cammie Sickle., MD;  Location: [facility];  Service: Orthopedics;  Laterality: Right;   TRIGGER FINGER RELEASE Left 09/22/2016   Procedure: RELEASE TRIGGER FINGER LEFT INDEX FINGER;  Surgeon: Mcarthur Rossetti, MD;  Location: [place];  Service: Orthopedics;  Laterality: Left;   TRIGGER FINGER RELEASE Right 01/20/2017   Procedure: RELEASE TRIGGER FINGER/A-1 PULLEY RIGHT INDEX FINGER;  Surgeon: Melrose Nakayama, MD;  Location: [place];  Service: Orthopedics;  Laterality: Right;   VITRECTOMY 25 GAUGE WITH SCLERAL BUCKLE Right 03/20/2018   Procedure: RIGHT EYE VITRECTOMY WITH  ENDOLASER PARENTAL PHOTOCOAGULATION 25 GAUGE;  Surgeon: Jalene Mullet, MD;  Location: [place];  Service: Ophthalmology;  Laterality: Right;   WRIST ARTHROSCOPY WITH DEBRIDEMENT Right 05/01/2019   Procedure: WRIST ARTHROSCOPY WITH DEBRIDEMENT;  Surgeon: Charlotte Crumb, MD;  Location: [place];  Service: Orthopedics;  Laterality: Right;     Current Outpatient Medications  Medication Sig Dispense Refill   Aflibercept (EYLEA) 2 MG/0.05ML SOLN See admin instructions. Every six weeks     amLODipine (NORVASC) 5 MG tablet Take 1 tablet (5 mg total) by mouth daily. 90 tablet 3   amoxicillin (AMOXIL) 500 MG capsule Take 4 capsules (2,000 mg total) by mouth 1 hour prior to dental appointment 16 capsule 0   atorvastatin (LIPITOR) 40 MG tablet Take 1 tablet (40 mg total) by mouth daily. 90 tablet 3   cholecalciferol (VITAMIN D3) 25 MCG (1000 UT) tablet  Take 1,000 Units by mouth daily.     Dapagliflozin-metFORMIN HCl ER (XIGDUO XR) 12-998 MG TB24 Take 1 tablet by mouth daily 90 tablet 3   Dulaglutide (TRULICITY) 4.5 BZ/1.6RC SOPN Inject 4.5 mg into the skin once a week. 2 mL 11   DULoxetine (CYMBALTA) 60 MG capsule Take 1 capsule (60 mg total) by mouth daily. 90 capsule 3   flecainide (TAMBOCOR) 100 MG tablet Take 1 tablet (100 mg total) by mouth 2 (two) times daily. 180 tablet 1   glucose blood test strip Use to test blood sugars 3 times a day 300 each 4   hydrALAZINE (  APRESOLINE) 25 MG tablet TAKE 1 TABLET BY MOUTH TWO TIMES DAILY WITH BREAKFAST AND DINNER 180 tablet 3   indomethacin (INDOCIN) 50 MG capsule TAKE 1 CAPSULE BY MOUTH TWO TO THREE TIMES DAILY AS NEEDED FOR GOUT PAIN 20 capsule 4   insulin glargine-yfgn (SEMGLEE, YFGN,) 100 UNIT/ML Pen Inject 70 units under the skin twice a day 120 mL 3   insulin glargine-yfgn (SEMGLEE, YFGN,) 100 UNIT/ML Pen Inject 70 units under the skin twice a day 135 mL 4   insulin glargine-yfgn (SEMGLEE, YFGN,) 100 UNIT/ML Pen Inject 75 units into the skin twice a day for 90 days. 180 mL 4   Insulin Pen Needle (UNIFINE PENTIPS) 32G X 4 MM MISC Use to adminster insulin subcutaneously twice daily 200 each 3   levothyroxine (SYNTHROID) 137 MCG tablet Take 1 tablet by mouth daily in the morning on an empty stomach 90 tablet 3   losartan (COZAAR) 50 MG tablet TAKE 1 TABLET BY MOUTH ONCE A DAY 90 tablet 4   losartan (COZAAR) 50 MG tablet Take 1 tablet (50 mg total) by mouth daily. 90 tablet 2   losartan (COZAAR) 50 MG tablet Take 1 tablet by mouth once a day 90 tablet 3   losartan (COZAAR) 50 MG tablet Take 1 tablet (50 mg total) by mouth daily. 90 tablet 2   Melatonin 5 MG CAPS Take 5-10 mg by mouth at bedtime as needed (sleep).      metoprolol tartrate (LOPRESSOR) 50 MG tablet Take 1 tablet by mouth 2 times daily. 180 tablet 3   omeprazole (PRILOSEC) 20 MG capsule TAKE 1 CAPSULE BY MOUTH ONCE DAILY 90 capsule 3    rivaroxaban (XARELTO) 20 MG TABS tablet Take 1 tablet (20 mg total) by mouth daily with supper. 90 tablet 1   spironolactone (ALDACTONE) 25 MG tablet Take 1/2 tablet mouth every other day. 45 tablet 1   No current facility-administered medications for this visit.    Allergies:   Actos [pioglitazone hydrochloride], Avandia [rosiglitazone], Tetanus toxoids, Pioglitazone, and Food    Social History:  The patient  reports that she quit smoking about 44 years ago. Her smoking use included cigarettes. She has never used smokeless tobacco. She reports that she does not drink alcohol and does not use drugs.   Family History:  The patient's family history includes Alcoholism in her father; Cancer in her father; Diabetes in her mother; Heart attack in her mother; Hypertension in her mother; Liver disease in her father; Obesity in her mother; Sleep apnea in her father; Thyroid disease in her mother.    ROS:  Please see the history of present illness.   Otherwise, review of systems are positive for none.   All other systems are reviewed and negative.    PHYSICAL EXAM: VS:  There were no vitals taken for this visit. , BMI There is no height or weight on file to calculate BMI. Affect appropriate Healthy:  appears stated age 61: normal Neck supple with no adenopathy JVP normal no bruits no thyromegaly Lungs clear with no wheezing and good diaphragmatic motion Heart:  S1/S2 no murmur, no rub, gallop or click PMI normal Abdomen: benighn, BS positve, no tenderness, no AAA no bruit.  No HSM or HJR Distal pulses intact with no bruits No edema Neuro non-focal Skin warm and dry Post right wrist surgery     EKG:  08/02/19 SR rate 66 normal 12/20/2021 NSR rate 67 poor R wave progression    Recent Labs: 07/07/2021: ALT  17; Hemoglobin 12.3; NT-Pro BNP 334; Platelets 361; TSH 0.583 08/04/2021: BUN 31; Creatinine, Ser 1.21; Potassium 4.7; Sodium 143    Lipid Panel    Component Value Date/Time    CHOL 126 07/18/2017 1112   TRIG 98 07/18/2017 1112   HDL 43 07/18/2017 1112   LDLCALC 63 07/18/2017 1112      Wt Readings from Last 3 Encounters:  10/22/21 257 lb (116.6 kg)  10/05/21 261 lb (118.4 kg)  07/21/21 258 lb (117 kg)      Other studies Reviewed: Additional studies/ records that were reviewed today include TTE 07/19/21 Myovue 07/20/21 and monitor 09/28/21     ASSESSMENT AND PLAN:  1.  PAF:  Maintaining NSR continue flecainide dose increased by Dr Curt Bears despite interaction with Cymbalta which can increase levels  ***  Ablation deferred due to obesity ETT ok and myovue with no ischemia ***  2. Anticoagulation no bleeding issues continue Xarelto   3. HTN:  Well controlled.  Continue current medications and low sodium Dash type diet.    4. HLD  Continue statin labs with primary   5. Obesity:  F/u weight loss clinic On Cymbalta now    6. DM:  Discussed low carb diet.  Target hemoglobin A1c is 6.5 or less.  Continue current medications.    Current medicines are reviewed at length with the patient today.  The patient does not have concerns regarding medicines.  The following changes have been made:  no change  14 day monitor  BMET/CBC/PLT   Disposition:   [facility] [place]/Afib clinic post monitor f/u with me in a year     Signed, [name], MD  12/20/2021 8:21 AM    [address], [place], [place]  [address] Phone: [phone]; Fax: [phone]

## 2021-12-22 ENCOUNTER — Ambulatory Visit (HOSPITAL_COMMUNITY): Payer: 59 | Admitting: Physician Assistant

## 2021-12-22 ENCOUNTER — Ambulatory Visit: Payer: 59 | Admitting: Cardiovascular Disease

## 2021-12-23 ENCOUNTER — Encounter (HOSPITAL_COMMUNITY): Payer: Self-pay | Admitting: Physician Assistant

## 2021-12-23 ENCOUNTER — Ambulatory Visit (HOSPITAL_COMMUNITY)
Admission: RE | Admit: 2021-12-23 | Discharge: 2021-12-23 | Disposition: A | Discharge status: Home / Self Care | Payer: 59 | Source: Ambulatory Visit | Attending: Physician Assistant | Admitting: Physician Assistant

## 2021-12-23 VITALS — BP 134/78 | HR 61 | Ht 62.0 in | Wt 259.4 lb

## 2021-12-23 DIAGNOSIS — Z794 Long term (current) use of insulin: Secondary | ICD-10-CM | POA: Diagnosis not present

## 2021-12-23 DIAGNOSIS — E669 Obesity, unspecified: Secondary | ICD-10-CM | POA: Insufficient documentation

## 2021-12-23 DIAGNOSIS — E118 Type 2 diabetes mellitus with unspecified complications: Secondary | ICD-10-CM | POA: Insufficient documentation

## 2021-12-23 DIAGNOSIS — Z7901 Long term (current) use of anticoagulants: Secondary | ICD-10-CM | POA: Insufficient documentation

## 2021-12-23 DIAGNOSIS — I110 Hypertensive heart disease with heart failure: Secondary | ICD-10-CM | POA: Insufficient documentation

## 2021-12-23 DIAGNOSIS — Z79899 Other long term (current) drug therapy: Secondary | ICD-10-CM | POA: Insufficient documentation

## 2021-12-23 DIAGNOSIS — G4733 Obstructive sleep apnea (adult) (pediatric): Secondary | ICD-10-CM | POA: Diagnosis not present

## 2021-12-23 DIAGNOSIS — I480 Paroxysmal atrial fibrillation: Secondary | ICD-10-CM | POA: Diagnosis not present

## 2021-12-23 DIAGNOSIS — E785 Hyperlipidemia, unspecified: Secondary | ICD-10-CM | POA: Diagnosis not present

## 2021-12-23 DIAGNOSIS — Z6841 Body Mass Index (BMI) 40.0 and over, adult: Secondary | ICD-10-CM | POA: Diagnosis not present

## 2021-12-23 DIAGNOSIS — I5032 Chronic diastolic (congestive) heart failure: Secondary | ICD-10-CM | POA: Diagnosis not present

## 2021-12-23 DIAGNOSIS — D6869 Other thrombophilia: Secondary | ICD-10-CM | POA: Diagnosis not present

## 2021-12-23 NOTE — Progress Notes (Signed)
Primary Care Physician: Charlane Ferretti, MD Primary Cardiologist: Dr Johnsie Cancel Primary Electrophysiologist: Dr Curt Bears  Referring Physician: Dr Wilma Flavin is a 71 y.o. female with a history of DM, chronic HFpEF, HLD, HTN, OSA, atrial fibrillation who presents for follow up in the [facility]. Seen by Roderic Palau remotely and has been maintained on flecainide. Patient is on Xarelto for a CHADS2VASC score of 5. She had a cardiac monitor placed 09/2021 which showed an increased burden of afib (18%). Seen by Dr Curt Bears 10/05/21 and her flecainide was increased.   On follow up today, patient reports that she has done well since her last visit. Her afib has decreased further to about once per week lasting only < 5 minutes. No bleeding issues on anticoagulation.   Today, she denies symptoms of palpitations, chest pain, shortness of breath, orthopnea, PND, lower extremity edema, dizziness, presyncope, syncope, bleeding, or neurologic sequela. The patient is tolerating medications without difficulties and is otherwise without complaint today.    Atrial Fibrillation Risk Factors:  she does have symptoms or diagnosis of sleep apnea. she is compliant with CPAP therapy. she does not have a history of rheumatic fever.   she has a BMI of Body mass index is 47.44 kg/m.[name] Filed Weights   12/23/21 1421  Weight: 117.7 kg    Family History  Problem Relation Age of Onset   Diabetes Mother    Hypertension Mother    Heart attack Mother    Thyroid disease Mother    Obesity Mother    Cancer Father    Liver disease Father    Sleep apnea Father    Alcoholism Father      Atrial Fibrillation Management history:  Previous antiarrhythmic drugs: flecainide Previous cardioversions: none Previous ablations: none CHADS2VASC score: 5 Anticoagulation history: Xarelto   Past Medical History:  Diagnosis Date   A-fib ([place])    Anemia    hx of   Arthritis     Chest pain    Constipation    Depression    Diabetes mellitus ORAL MEDS   Diabetic retinopathy ([place])    Dyspnea    with minimal exertion-deconditioned   Dyspnea    Dysrhythmia    a-fib   Food allergy    GERD (gastroesophageal reflux disease)    occasional   Gout    History of kidney stones    Hypercholesteremia    Hyperlipidemia    Hypertension    Hypertensive kidney disease    Hypothyroidism    Knee pain, right    Left arm numbness DUE TO CERVICAL PINCHED NERVE   Obesity    OSA on CPAP    uses CPAP nightly   Osteoarthritis    Palpitations    Pinched nerve in neck    Pleurisy    Pneumonia    hx of   PONV (postoperative nausea and vomiting)    for 3-4 days after general anesthesia approx 10-12 years ago   Sciatica    Swelling of knee joint, right    Synovitis of knee RIGHT   Trigger finger, left    left index   Vitamin D deficiency    Past Surgical History:  Procedure Laterality Date   CESAREAN SECTION  X3   KNEE ARTHROSCOPY  04/20/2011   Procedure: ARTHROSCOPY KNEE;  Surgeon: Gearlean Alf, MD;  Location: [place];  Service: Orthopedics;  Laterality: Right;  WITH SYNOVECTOMY   LEFT CARPAL TUNNEL /  LEFT MIDDLE & RING FINGER TRIGGER RELEASE  08-26-2008   LEFT SHOULDER ARTHROSCOPY W/ DEBRIDEMENT  09-09-2003   LEFT SHOULDER ARTHROSCOPY/ LEFT THUMB TRIGGER RELEASE  02-22-2005   PHOTOCOAGULATION WITH LASER Right 03/20/2018   Procedure: PHOTOCOAGULATION WITH LASER;  Surgeon: Jalene Mullet, MD;  Location: [place];  Service: Ophthalmology;  Laterality: Right;   PULLEY RELEASE LEFT LONG FINGER  07-14-2009   RADIAL HEAD ARTHROPLASTY Right 06/17/2018   Procedure: RADIAL HEAD ARTHROPLASTY;  Surgeon: Hiram Gash, MD;  Location: [place];  Service: Orthopedics;  Laterality: Right;   RIGHT CARPAL TUNNEL/ RIGHT THUMB TRIGGER RELEASE'S  11-28-2006   RIGHT SHOULDER ARTHROSCOPY W/ ROTATOR CUFF REPAIR  01-13-2004   SHOULDER ARTHROSCOPY DISTAL CLAVICLE EXCISION AND OPEN  ROTATOR CUFF REPAIR  09-07-2004   LEFT   SHOULDER ARTHROSCOPY W/ ACROMIAL REPAIR  11-29-2005   LEFT   TONSILLECTOMY AND ADENOIDECTOMY  child   TOTAL KNEE ARTHROPLASTY  12-14-2009   RIGHT   TOTAL KNEE ARTHROPLASTY Left 11/05/2012   Procedure: LEFT TOTAL KNEE ARTHROPLASTY;  Surgeon: Gearlean Alf, MD;  [facility];  Service: Orthopedics;  Laterality: Left;   TOTAL KNEE REVISION Right 07/29/2016   Procedure: RIGHT KNEE POLY-LINER EXCHANGE;  Surgeon: Mcarthur Rossetti, MD;  [facility];  Service: Orthopedics;  Laterality: Right;   TRIGGER FINGER RELEASE Right 02/21/2013   Procedure: RIGHT RING A-1 PULLEY RELEASE    (MINOR PROCEDURE) ;  Surgeon: Cammie Sickle., MD;  Location: [facility];  Service: Orthopedics;  Laterality: Right;   TRIGGER FINGER RELEASE Left 09/22/2016   Procedure: RELEASE TRIGGER FINGER LEFT INDEX FINGER;  Surgeon: Mcarthur Rossetti, MD;  Location: [place];  Service: Orthopedics;  Laterality: Left;   TRIGGER FINGER RELEASE Right 01/20/2017   Procedure: RELEASE TRIGGER FINGER/A-1 PULLEY RIGHT INDEX FINGER;  Surgeon: Melrose Nakayama, MD;  Location: [place];  Service: Orthopedics;  Laterality: Right;   VITRECTOMY 25 GAUGE WITH SCLERAL BUCKLE Right 03/20/2018   Procedure: RIGHT EYE VITRECTOMY WITH  ENDOLASER PARENTAL PHOTOCOAGULATION 25 GAUGE;  Surgeon: Jalene Mullet, MD;  Location: [place];  Service: Ophthalmology;  Laterality: Right;   WRIST ARTHROSCOPY WITH DEBRIDEMENT Right 05/01/2019   Procedure: WRIST ARTHROSCOPY WITH DEBRIDEMENT;  Surgeon: Charlotte Crumb, MD;  Location: [place];  Service: Orthopedics;  Laterality: Right;    Current Outpatient Medications  Medication Sig Dispense Refill   Aflibercept (EYLEA) 2 MG/0.05ML SOLN See admin instructions. Every six weeks     amLODipine (NORVASC) 5 MG tablet Take 1 tablet (5 mg total) by mouth daily. 90 tablet 3   amoxicillin (AMOXIL) 500 MG capsule Take 4  capsules (2,000 mg total) by mouth 1 hour prior to dental appointment 16 capsule 0   atorvastatin (LIPITOR) 40 MG tablet Take 1 tablet (40 mg total) by mouth daily. 90 tablet 3   cholecalciferol (VITAMIN D3) 25 MCG (1000 UT) tablet Take 1,000 Units by mouth daily.     Dapagliflozin-metFORMIN HCl ER (XIGDUO XR) 12-998 MG TB24 Take 1 tablet by mouth daily 90 tablet 3   Dulaglutide (TRULICITY) 4.5 0000000 SOPN Inject 4.5 mg into the skin once a week. 2 mL 11   DULoxetine (CYMBALTA) 60 MG capsule Take 1 capsule (60 mg total) by mouth daily. 90 capsule 3   flecainide (TAMBOCOR) 100 MG tablet Take 1 tablet (100 mg total) by mouth 2 (two) times daily. 180 tablet 1   glucose blood test strip Use to test blood sugars 3 times a  day 300 each 4   hydrALAZINE (APRESOLINE) 25 MG tablet TAKE 1 TABLET BY MOUTH TWO TIMES DAILY WITH BREAKFAST AND DINNER 180 tablet 3   indomethacin (INDOCIN) 50 MG capsule TAKE 1 CAPSULE BY MOUTH TWO TO THREE TIMES DAILY AS NEEDED FOR GOUT PAIN 20 capsule 4   insulin glargine-yfgn (SEMGLEE, YFGN,) 100 UNIT/ML Pen Inject 70 units under the skin twice a day 120 mL 3   insulin glargine-yfgn (SEMGLEE, YFGN,) 100 UNIT/ML Pen Inject 70 units under the skin twice a day 135 mL 4   Insulin Pen Needle (UNIFINE PENTIPS) 32G X 4 MM MISC Use to adminster insulin subcutaneously twice daily 200 each 3   levothyroxine (SYNTHROID) 137 MCG tablet Take 1 tablet by mouth daily in the morning on an empty stomach 90 tablet 3   losartan (COZAAR) 50 MG tablet Take 1 tablet (50 mg total) by mouth daily. 90 tablet 2   losartan (COZAAR) 50 MG tablet Take 1 tablet (50 mg total) by mouth daily. 90 tablet 2   Melatonin 5 MG CAPS Take 5-10 mg by mouth at bedtime as needed (sleep).      metoprolol tartrate (LOPRESSOR) 50 MG tablet Take 1 tablet by mouth 2 times daily. 180 tablet 3   omeprazole (PRILOSEC) 20 MG capsule TAKE 1 CAPSULE BY MOUTH ONCE DAILY 90 capsule 3   rivaroxaban (XARELTO) 20 MG TABS tablet Take 1  tablet (20 mg total) by mouth daily with supper. 90 tablet 1   spironolactone (ALDACTONE) 25 MG tablet Take 1/2 tablet mouth every other day. 45 tablet 1   losartan (COZAAR) 50 MG tablet TAKE 1 TABLET BY MOUTH ONCE A DAY 90 tablet 4   No current facility-administered medications for this encounter.    Allergies  Allergen Reactions   Actos [Pioglitazone Hydrochloride] Swelling and Other (See Comments)    Numbness in lips, HEADACHES SWELLING REACTION UNSPECIFIED    Avandia [Rosiglitazone] Swelling    Numbness in lips, headaches  SWELLING REACTION UNSPECIFIED    Tetanus Toxoids Other (See Comments)    Arm swelling, fainted, ?Respiratory distress  (horse serum)   Pioglitazone     Other reaction(s): Edema NDC [id number]:[id number] NDC DGUY:[id number] NDC LOVF:[id number]    Food Nausea Only    Eggplant    Social History   Socioeconomic History   Marital status: Single    Spouse name: Not on file   Number of children: Not on file   Years of education: Not on file   Highest education level: Not on file  Occupational History   Not on file  Tobacco Use   Smoking status: Former    Years: 5.00    Types: Cigarettes    Quit date: 04/12/1977    Years since quitting: 44.7   Smokeless tobacco: Never   Tobacco comments:    Former smoker 10/22/21  Vaping Use   Vaping Use: Never used  Substance and Sexual Activity   Alcohol use: No   Drug use: No   Sexual activity: Not on file  Other Topics Concern   Not on file  Social History Narrative   Not on file   Social Determinants of Health   Financial Resource Strain: Not on file  Food Insecurity: Not on file  Transportation Needs: Not on file  Physical Activity: Not on file  Stress: Not on file  Social Connections: Not on file  Intimate Partner Violence: Not on file     ROS- All systems are reviewed and negative except as per  the HPI above.  Physical Exam: Vitals:   12/23/21 1421  BP: 134/78  Pulse: 61  Weight: 117.7 kg   Height: 5\' 2"  (1.575 m)     GEN- The patient is a well appearing obese female, alert and oriented x 3 today.   HEENT-head normocephalic, atraumatic, sclera clear, conjunctiva pink, hearing intact, trachea midline. Lungs- Clear to ausculation bilaterally, normal work of breathing Heart- Regular rate and rhythm, no murmurs, rubs or gallops  GI- soft, NT, ND, + BS Extremities- no clubbing, cyanosis, or edema MS- no significant deformity or atrophy Skin- no rash or lesion Psych- euthymic mood, full affect Neuro- strength and sensation are intact   Wt Readings from Last 3 Encounters:  12/23/21 117.7 kg  10/22/21 116.6 kg  10/05/21 118.4 kg    EKG today demonstrates  SR, 1st degree AV block Vent. rate 61 BPM PR interval 222 ms QRS duration 108 ms QT/QTcB 462/465 ms  Echo 07/19/21 demonstrated   1. Left ventricular ejection fraction, by estimation, is 55 to 60%. The  left ventricle has normal function. The left ventricle has no regional  wall motion abnormalities. There is mild left ventricular hypertrophy.  Left ventricular diastolic parameters are consistent with Grade I diastolic dysfunction (impaired relaxation).   2. Right ventricular systolic function is normal. The right ventricular  size is normal. There is normal pulmonary artery systolic pressure. The estimated right ventricular systolic pressure is [id number] mmHg.   3. Left atrial size was mildly dilated.   4. Right atrial size was mildly dilated.   5. The mitral valve is normal in structure. Trivial mitral valve  regurgitation. No evidence of mitral stenosis. Moderate mitral annular calcification.   6. The aortic valve is tricuspid. There is mild calcification of the  aortic valve. Aortic valve regurgitation is not visualized. No aortic  stenosis is present.   7. The inferior vena cava is normal in size with greater than 50%  respiratory variability, suggesting right atrial pressure of 3 mmHg.   Epic records are reviewed  at length today  CHA2DS2-VASc Score = 5  The patient's score is based upon: CHF History: 1 HTN History: 1 Diabetes History: 1 Stroke History: 0 Vascular Disease History: 0 Age Score: 1 Gender Score: 1       ASSESSMENT AND PLAN: 1. Paroxysmal Atrial Fibrillation (ICD10:  I48.0) The patient's CHA2DS2-VASc score is 5, indicating a 7.2% annual risk of stroke.   Patient having only very brief palpitations.  Continue flecainide 100 mg BID Continue Lopressor 50 mg BID Dofetilide would be next step if she fails flecainide.   2. Secondary Hypercoagulable State (ICD10:  D68.69) The patient is at significant risk for stroke/thromboembolism based upon her CHA2DS2-VASc Score of 5.  Continue Rivaroxaban (Xarelto).   3. Obesity Body mass index is 47.44 kg/m. Lifestyle modification was discussed and encouraged including regular physical activity and weight reduction. Referred to [facility].  4. Obstructive sleep apnea Encouraged compliance with CPAP therapy.  5. HTN Stable, no changes today.  6. Chronic HFpEF Appears euvolemic today.   Follow up in the AF clinic in 6 months.    [facility] [address] [place], [place] [address] [phone] 12/23/2021 4:19 PM

## 2021-12-26 ENCOUNTER — Other Ambulatory Visit: Payer: Self-pay | Admitting: Cardiovascular Disease

## 2021-12-26 ENCOUNTER — Other Ambulatory Visit (HOSPITAL_COMMUNITY): Payer: Self-pay

## 2021-12-27 ENCOUNTER — Telehealth: Payer: Self-pay

## 2021-12-27 ENCOUNTER — Other Ambulatory Visit (HOSPITAL_COMMUNITY): Payer: Self-pay

## 2021-12-27 NOTE — Telephone Encounter (Signed)
Patient returned call reference starting PREP Can do MW 230p-345p Oct 30 start  Returned call  Intake scheduled for 10/26 at 4pm at [facility]

## 2021-12-28 ENCOUNTER — Other Ambulatory Visit (HOSPITAL_COMMUNITY): Payer: Self-pay

## 2021-12-28 MED ORDER — AMOXICILLIN 500 MG PO CAPS
ORAL_CAPSULE | ORAL | 0 refills | Status: DC
  Filled 2021-12-28: qty 16, 4d supply, fill #0

## 2021-12-28 MED ORDER — METOPROLOL TARTRATE 50 MG PO TABS
50.0000 mg | ORAL_TABLET | Freq: Two times a day (BID) | ORAL | 1 refills | Status: DC
  Filled 2021-12-28: qty 180, 90d supply, fill #0
  Filled 2022-03-25: qty 180, 90d supply, fill #1

## 2022-01-03 ENCOUNTER — Other Ambulatory Visit (HOSPITAL_COMMUNITY): Payer: Self-pay

## 2022-01-03 DIAGNOSIS — E113512 Type 2 diabetes mellitus with proliferative diabetic retinopathy with macular edema, left eye: Secondary | ICD-10-CM | POA: Diagnosis not present

## 2022-01-04 ENCOUNTER — Other Ambulatory Visit (HOSPITAL_COMMUNITY): Payer: Self-pay

## 2022-01-10 ENCOUNTER — Other Ambulatory Visit (HOSPITAL_COMMUNITY): Payer: Self-pay

## 2022-01-11 ENCOUNTER — Telehealth: Payer: Self-pay | Admitting: Podiatry

## 2022-01-11 ENCOUNTER — Other Ambulatory Visit (HOSPITAL_COMMUNITY): Payer: Self-pay

## 2022-01-11 NOTE — Telephone Encounter (Signed)
lvm for patient to contact office to sched appointment  for opu  

## 2022-01-12 NOTE — Progress Notes (Signed)
[facility] PREP Evaluation  Patient Details  Name: [name] MRN: [id number] Date of Birth: 02-10-1951 Age: 71 y.o. PCP: Charlane Ferretti, MD  Vitals:   01/12/22 1603  BP: 128/82  Pulse: 64  SpO2: 96%  Weight: 263 lb 3.2 oz (119.4 kg)     [facility] Eval - 01/12/22 1600       [facility] "PREP" Location   [facility] "PREP" Location [facility]      Referral    Referring Provider [place]    Reason for referral Inactivity;Obesitity/Overweight;Diabetes;Hypertension   OSA and AFIB   Program Start Date 01/10/22   M/W 230p-345p x 12 wks     Measurement   Waist Circumference 53 inches    Hip Circumference 55 inches    Body fat --   unable to measure     Information for Trainer   Goals Decrease back pain, walk further without SOB or dizziness, lose 20 lbs in the 12 wks    Current Exercise infrequent walking, hand weights at home    Orthopedic Concerns Back pain, THR bil with rev on right    Pertinent Medical History OSA, AFIB, DM, HTN, hypothyroid    Current Barriers none    Restrictions/Precautions Diabetic snack before exercise    Medications that affect exercise Medication causing dizziness/drowsiness;Beta blocker      Timed Up and Go (TUGS)   Timed Up and Go Low risk <9 seconds      Mobility and Daily Activities   I find it easy to walk up or down two or more flights of stairs. 1    I have no trouble taking out the trash. 1    I do housework such as vacuuming and dusting on my own without difficulty. 1    I can easily lift a gallon of milk (8lbs). 1    I can easily walk a mile. 1    I have no trouble reaching into high cupboards or reaching down to pick up something from the floor. 2    I do not have trouble doing out-door work such as Armed forces logistics/support/administrative officer, raking leaves, or gardening. 1      Mobility and Daily Activities   I feel younger than my age. 2    I feel independent. 3    I feel energetic. 2    I live an active life.  1    I feel strong. 1    I feel healthy. 2    I feel  active as other people my age. 1      How fit and strong are you.   Fit and Strong Total Score 20            Past Medical History:  Diagnosis Date   A-fib ([place])    Anemia    hx of   Arthritis    Chest pain    Constipation    Depression    Diabetes mellitus ORAL MEDS   Diabetic retinopathy ([place])    Dyspnea    with minimal exertion-deconditioned   Dyspnea    Dysrhythmia    a-fib   Food allergy    GERD (gastroesophageal reflux disease)    occasional   Gout    History of kidney stones    Hypercholesteremia    Hyperlipidemia    Hypertension    Hypertensive kidney disease    Hypothyroidism    Knee pain, right    Left arm numbness DUE TO CERVICAL PINCHED NERVE  Obesity    OSA on CPAP    uses CPAP nightly   Osteoarthritis    Palpitations    Pinched nerve in neck    Pleurisy    Pneumonia    hx of   PONV (postoperative nausea and vomiting)    for 3-4 days after general anesthesia approx 10-12 years ago   Sciatica    Swelling of knee joint, right    Synovitis of knee RIGHT   Trigger finger, left    left index   Vitamin D deficiency    Past Surgical History:  Procedure Laterality Date   CESAREAN SECTION  X3   KNEE ARTHROSCOPY  04/20/2011   Procedure: ARTHROSCOPY KNEE;  Surgeon: Loanne Drilling, MD;  Location: [facility];  Service: Orthopedics;  Laterality: Right;  WITH SYNOVECTOMY   LEFT CARPAL TUNNEL / LEFT MIDDLE & RING FINGER TRIGGER RELEASE  08-26-2008   LEFT SHOULDER ARTHROSCOPY W/ DEBRIDEMENT  09-09-2003   LEFT SHOULDER ARTHROSCOPY/ LEFT THUMB TRIGGER RELEASE  02-22-2005   PHOTOCOAGULATION WITH LASER Right 03/20/2018   Procedure: PHOTOCOAGULATION WITH LASER;  Surgeon: Carmela Rima, MD;  Location: [facility] OR;  Service: Ophthalmology;  Laterality: Right;   PULLEY RELEASE LEFT LONG FINGER  07-14-2009   RADIAL HEAD ARTHROPLASTY Right 06/17/2018   Procedure: RADIAL HEAD ARTHROPLASTY;  Surgeon: Bjorn Pippin, MD;  Location: [facility] OR;  Service:  Orthopedics;  Laterality: Right;   RIGHT CARPAL TUNNEL/ RIGHT THUMB TRIGGER RELEASE'S  11-28-2006   RIGHT SHOULDER ARTHROSCOPY W/ ROTATOR CUFF REPAIR  01-13-2004   SHOULDER ARTHROSCOPY DISTAL CLAVICLE EXCISION AND OPEN ROTATOR CUFF REPAIR  09-07-2004   LEFT   SHOULDER ARTHROSCOPY W/ ACROMIAL REPAIR  11-29-2005   LEFT   TONSILLECTOMY AND ADENOIDECTOMY  child   TOTAL KNEE ARTHROPLASTY  12-14-2009   RIGHT   TOTAL KNEE ARTHROPLASTY Left 11/05/2012   Procedure: LEFT TOTAL KNEE ARTHROPLASTY;  Surgeon: Loanne Drilling, MD;  Location: [facility];  Service: Orthopedics;  Laterality: Left;   TOTAL KNEE REVISION Right 07/29/2016   Procedure: RIGHT KNEE POLY-LINER EXCHANGE;  Surgeon: Kathryne Hitch, MD;  Location: [facility];  Service: Orthopedics;  Laterality: Right;   TRIGGER FINGER RELEASE Right 02/21/2013   Procedure: RIGHT RING A-1 PULLEY RELEASE    (MINOR PROCEDURE) ;  Surgeon: Wyn Forster., MD;  Location: [place];  Service: Orthopedics;  Laterality: Right;   TRIGGER FINGER RELEASE Left 09/22/2016   Procedure: RELEASE TRIGGER FINGER LEFT INDEX FINGER;  Surgeon: Kathryne Hitch, MD;  Location: [facility];  Service: Orthopedics;  Laterality: Left;   TRIGGER FINGER RELEASE Right 01/20/2017   Procedure: RELEASE TRIGGER FINGER/A-1 PULLEY RIGHT INDEX FINGER;  Surgeon: Marcene Corning, MD;  Location: [facility];  Service: Orthopedics;  Laterality: Right;   VITRECTOMY 25 GAUGE WITH SCLERAL BUCKLE Right 03/20/2018   Procedure: RIGHT EYE VITRECTOMY WITH  ENDOLASER PARENTAL PHOTOCOAGULATION 25 GAUGE;  Surgeon: Carmela Rima, MD;  Location[facility];  Service: Ophthalmology;  Laterality: Right;   WRIST ARTHROSCOPY WITH DEBRIDEMENT Right 05/01/2019   Procedure: WRIST ARTHROSCOPY WITH DEBRIDEMENT;  Surgeon: Dairl Ponder, MD;  Location: [facility];  Service: Orthopedics;  Laterality: Right;   Social History   Tobacco Use  Smoking Status Former   Years:  5.00   Types: Cigarettes   Quit date: 04/12/1977   Years since quitting: 44.7  Smokeless Tobacco Never  Tobacco Comments   Former smoker 10/22/21    Bonnye Fava 01/12/2022, 4:09 PM

## 2022-01-13 ENCOUNTER — Telehealth: Payer: Self-pay | Admitting: Podiatry

## 2022-01-13 DIAGNOSIS — G5621 Lesion of ulnar nerve, right upper limb: Secondary | ICD-10-CM | POA: Diagnosis not present

## 2022-01-13 DIAGNOSIS — M24131 Other articular cartilage disorders, right wrist: Secondary | ICD-10-CM | POA: Diagnosis not present

## 2022-01-13 DIAGNOSIS — S63659A Sprain of metacarpophalangeal joint of unspecified finger, initial encounter: Secondary | ICD-10-CM | POA: Diagnosis not present

## 2022-01-13 NOTE — Telephone Encounter (Signed)
Pt called stating she got a message her orthotics are in and she needed to schedule an appt to pick them up. She is already scheduled to see Dr Posey Pronto on 12.8 and would like to pick them up at that appt. She has things planned and cannot come in on Monday's or Wednesdays for an appt and as of now that is the only days available for orthotic pick up.

## 2022-01-14 DIAGNOSIS — E113511 Type 2 diabetes mellitus with proliferative diabetic retinopathy with macular edema, right eye: Secondary | ICD-10-CM | POA: Diagnosis not present

## 2022-01-18 NOTE — Progress Notes (Signed)
[facility] PREP Weekly Session  Patient Details  Name: [name] MRN: [id number] Date of Birth: [birth year] Age: 71 y.o. PCP: Charlane Ferretti, MD  Vitals:   01/17/22 1430  Weight: 262 lb (118.8 kg)     [facility] Weekly seesion - 01/18/22 1700       [facility] "PREP" Location   [facility] "PREP" Location [facility]      Weekly Session   Topic Discussed Importance of resistance training;Other ways to be active    Minutes exercised this week 165 minutes    Classes attended to date [place] 01/18/2022, 5:21 PM

## 2022-01-20 ENCOUNTER — Other Ambulatory Visit (HOSPITAL_COMMUNITY): Payer: Self-pay

## 2022-01-20 MED ORDER — AMOXICILLIN 500 MG PO CAPS
500.0000 mg | ORAL_CAPSULE | Freq: Three times a day (TID) | ORAL | 0 refills | Status: DC
  Filled 2022-01-20: qty 21, 7d supply, fill #0

## 2022-01-25 ENCOUNTER — Ambulatory Visit
Admission: RE | Admit: 2022-01-25 | Discharge: 2022-01-25 | Disposition: A | Discharge status: Home / Self Care | Payer: 59 | Source: Ambulatory Visit | Attending: Internal Medicine | Admitting: Internal Medicine

## 2022-01-25 DIAGNOSIS — M8580 Other specified disorders of bone density and structure, unspecified site: Secondary | ICD-10-CM

## 2022-01-25 DIAGNOSIS — Z1231 Encounter for screening mammogram for malignant neoplasm of breast: Secondary | ICD-10-CM

## 2022-01-25 DIAGNOSIS — M85852 Other specified disorders of bone density and structure, left thigh: Secondary | ICD-10-CM | POA: Diagnosis not present

## 2022-01-25 DIAGNOSIS — Z780 Asymptomatic menopausal state: Secondary | ICD-10-CM | POA: Diagnosis not present

## 2022-01-26 ENCOUNTER — Ambulatory Visit: Payer: 59 | Admitting: Podiatry

## 2022-01-28 ENCOUNTER — Other Ambulatory Visit: Payer: Self-pay | Admitting: Internal Medicine

## 2022-01-28 DIAGNOSIS — R928 Other abnormal and inconclusive findings on diagnostic imaging of breast: Secondary | ICD-10-CM

## 2022-02-02 DIAGNOSIS — G4733 Obstructive sleep apnea (adult) (pediatric): Secondary | ICD-10-CM | POA: Diagnosis not present

## 2022-02-02 NOTE — Progress Notes (Signed)
[facility] PREP Weekly Session  Patient Details  Name: [name] MRN: [id number] Date of Birth: 20-Mar-1950 Age: 71 y.o. PCP: Thana Ates, MD  Vitals:   01/31/22 1430  Weight: 260 lb (117.9 kg)     [facility] Weekly seesion - 02/02/22 1600       [facility] "PREP" Location   [facility]      Weekly Session   Topic Discussed Health habits    Minutes exercised this week 120 minutes    Classes attended to date 8             Bonnye Fava 02/02/2022, 4:42 PM

## 2022-02-07 ENCOUNTER — Other Ambulatory Visit (HOSPITAL_COMMUNITY): Payer: Self-pay

## 2022-02-07 DIAGNOSIS — E113512 Type 2 diabetes mellitus with proliferative diabetic retinopathy with macular edema, left eye: Secondary | ICD-10-CM | POA: Diagnosis not present

## 2022-02-08 ENCOUNTER — Other Ambulatory Visit (HOSPITAL_COMMUNITY): Payer: Self-pay

## 2022-02-10 ENCOUNTER — Other Ambulatory Visit (HOSPITAL_COMMUNITY): Payer: Self-pay

## 2022-02-14 ENCOUNTER — Other Ambulatory Visit (HOSPITAL_COMMUNITY): Payer: Self-pay

## 2022-02-14 MED ORDER — OMEPRAZOLE 20 MG PO CPDR
20.0000 mg | DELAYED_RELEASE_CAPSULE | Freq: Every day | ORAL | 3 refills | Status: DC
  Filled 2022-02-14: qty 90, 90d supply, fill #0
  Filled 2022-05-18: qty 90, 90d supply, fill #1
  Filled 2022-08-14: qty 90, 90d supply, fill #2
  Filled 2022-11-16: qty 90, 90d supply, fill #3

## 2022-02-14 MED ORDER — INDOMETHACIN 50 MG PO CAPS
50.0000 mg | ORAL_CAPSULE | Freq: Three times a day (TID) | ORAL | 3 refills | Status: DC | PRN
  Filled 2022-02-14: qty 20, 7d supply, fill #0
  Filled 2022-04-07: qty 20, 7d supply, fill #1
  Filled 2022-05-18: qty 20, 7d supply, fill #2
  Filled 2022-08-12: qty 20, 7d supply, fill #3

## 2022-02-15 ENCOUNTER — Other Ambulatory Visit (HOSPITAL_COMMUNITY): Payer: Self-pay

## 2022-02-16 NOTE — Progress Notes (Signed)
[facility] PREP Weekly Session  Patient Details  Name: DANIALLE DEMENT MRN: [id number] Date of Birth: 01-Aug-1950 Age: 71 y.o. PCP: Thana Ates, MD  Vitals:   02/14/22 1430  Weight: 257 lb (116.6 kg)     [facility] Weekly seesion - 02/16/22 1000       [facility] "PREP" Location   [facility]      Weekly Session   Topic Discussed Restaurant Eating   Salt and sugar demos   Minutes exercised this week 150 minutes    Classes attended to date 10             Bonnye Fava 02/16/2022, 10:03 AM

## 2022-02-17 DIAGNOSIS — S63653A Sprain of metacarpophalangeal joint of left middle finger, initial encounter: Secondary | ICD-10-CM | POA: Diagnosis not present

## 2022-02-17 DIAGNOSIS — G5621 Lesion of ulnar nerve, right upper limb: Secondary | ICD-10-CM | POA: Diagnosis not present

## 2022-02-18 ENCOUNTER — Ambulatory Visit (INDEPENDENT_AMBULATORY_CARE_PROVIDER_SITE_OTHER): Payer: 59 | Admitting: Podiatry

## 2022-02-18 DIAGNOSIS — M7752 Other enthesopathy of left foot: Secondary | ICD-10-CM | POA: Diagnosis not present

## 2022-02-18 DIAGNOSIS — Q667 Congenital pes cavus, unspecified foot: Secondary | ICD-10-CM | POA: Diagnosis not present

## 2022-02-18 NOTE — Progress Notes (Signed)
Subjective:  Patient ID: [name], female    DOB: Jan 20, 1951,  MRN: [id number]  Chief Complaint  Patient presents with   Foot Pain    71 y.o. female presents with the above complaint.  Patient presents for follow-up of left lateral ankle pain.  She states is doing a lot better the injection helped.  She denies any other acute complaints.   Review of Systems: Negative except as noted in the HPI. Denies N/V/F/Ch.  Past Medical History:  Diagnosis Date   A-fib ([place])    Anemia    hx of   Arthritis    Chest pain    Constipation    Depression    Diabetes mellitus ORAL MEDS   Diabetic retinopathy ([place])    Dyspnea    with minimal exertion-deconditioned   Dyspnea    Dysrhythmia    a-fib   Food allergy    GERD (gastroesophageal reflux disease)    occasional   Gout    History of kidney stones    Hypercholesteremia    Hyperlipidemia    Hypertension    Hypertensive kidney disease    Hypothyroidism    Knee pain, right    Left arm numbness DUE TO CERVICAL PINCHED NERVE   Obesity    OSA on CPAP    uses CPAP nightly   Osteoarthritis    Palpitations    Pinched nerve in neck    Pleurisy    Pneumonia    hx of   PONV (postoperative nausea and vomiting)    for 3-4 days after general anesthesia approx 10-12 years ago   Sciatica    Swelling of knee joint, right    Synovitis of knee RIGHT   Trigger finger, left    left index   Vitamin D deficiency     Current Outpatient Medications:    Aflibercept (EYLEA) 2 MG/0.05ML SOLN, See admin instructions. Every six weeks, Disp: , Rfl:    amLODipine (NORVASC) 5 MG tablet, Take 1 tablet (5 mg total) by mouth daily., Disp: 90 tablet, Rfl: 3   amoxicillin (AMOXIL) 500 MG capsule, Take 4 capsules (2,000 mg total) by mouth 1 hour prior to dental appointment, Disp: 16 capsule, Rfl: 0   amoxicillin (AMOXIL) 500 MG capsule, Take 1 capsule (500 mg total) by mouth every 8 (eight) hours until all taken, Disp: 21 capsule, Rfl: 0    atorvastatin (LIPITOR) 40 MG tablet, Take 1 tablet (40 mg total) by mouth daily., Disp: 90 tablet, Rfl: 3   cholecalciferol (VITAMIN D3) 25 MCG (1000 UT) tablet, Take 1,000 Units by mouth daily., Disp: , Rfl:    Dapagliflozin Pro-metFORMIN ER (XIGDUO XR) 12-998 MG TB24, Take 1 tablet by mouth daily, Disp: 90 tablet, Rfl: 3   Dulaglutide (TRULICITY) 4.5 0000000 SOPN, Inject 4.5 mg into the skin once a week., Disp: 2 mL, Rfl: 11   DULoxetine (CYMBALTA) 60 MG capsule, Take 1 capsule (60 mg total) by mouth daily., Disp: 90 capsule, Rfl: 3   flecainide (TAMBOCOR) 100 MG tablet, Take 1 tablet (100 mg total) by mouth 2 (two) times daily., Disp: 180 tablet, Rfl: 1   glucose blood test strip, Use to test blood sugars 3 times a day, Disp: 300 each, Rfl: 4   hydrALAZINE (APRESOLINE) 25 MG tablet, TAKE 1 TABLET BY MOUTH TWO TIMES DAILY WITH BREAKFAST AND DINNER, Disp: 180 tablet, Rfl: 3   indomethacin (INDOCIN) 50 MG capsule, Take 1 capsule (50 mg total) by mouth 2(two) to 3 (three) times  daily as needed for gout pain, Disp: 20 capsule, Rfl: 3   insulin glargine-yfgn (SEMGLEE, YFGN,) 100 UNIT/ML Pen, Inject 75 units into the skin twice a day for 90 days., Disp: 180 mL, Rfl: 4   Insulin Pen Needle (UNIFINE PENTIPS) 32G X 4 MM MISC, Use to adminster insulin subcutaneously twice daily, Disp: 200 each, Rfl: 3   levothyroxine (SYNTHROID) 137 MCG tablet, Take 1 tablet by mouth daily in the morning on an empty stomach, Disp: 90 tablet, Rfl: 3   losartan (COZAAR) 50 MG tablet, TAKE 1 TABLET BY MOUTH ONCE A DAY, Disp: 90 tablet, Rfl: 4   losartan (COZAAR) 50 MG tablet, Take 1 tablet (50 mg total) by mouth daily., Disp: 90 tablet, Rfl: 2   losartan (COZAAR) 50 MG tablet, Take 1 tablet (50 mg total) by mouth daily., Disp: 90 tablet, Rfl: 2   Melatonin 5 MG CAPS, Take 5-10 mg by mouth at bedtime as needed (sleep). , Disp: , Rfl:    metoprolol tartrate (LOPRESSOR) 50 MG tablet, Take 1 tablet (50 mg total) by mouth 2 (two)  times daily. Please keep scheduled appointment for additional refills., Disp: 180 tablet, Rfl: 1   omeprazole (PRILOSEC) 20 MG capsule, Take 1 capsule (20 mg total) by mouth daily., Disp: 90 capsule, Rfl: 3   rivaroxaban (XARELTO) 20 MG TABS tablet, Take 1 tablet (20 mg total) by mouth daily with supper., Disp: 90 tablet, Rfl: 1   spironolactone (ALDACTONE) 25 MG tablet, Take 1/2 tablet mouth every other day., Disp: 45 tablet, Rfl: 1  Social History   Tobacco Use  Smoking Status Former   Years: 5.00   Types: Cigarettes   Quit date: 04/12/1977   Years since quitting: 44.9  Smokeless Tobacco Never  Tobacco Comments   Former smoker 10/22/21    Allergies  Allergen Reactions   Actos [Pioglitazone Hydrochloride] Swelling and Other (See Comments)    Numbness in lips, HEADACHES SWELLING REACTION UNSPECIFIED    Avandia [Rosiglitazone] Swelling    Numbness in lips, headaches  SWELLING REACTION UNSPECIFIED    Tetanus Toxoids Other (See Comments)    Arm swelling, fainted, ?Respiratory distress  (horse serum)   Pioglitazone     Other reaction(s): Edema NDC YPPJ:[id number] NDC KDXI:[id number] NDC BHAL:[id number]    Food Nausea Only    Eggplant   Objective:  There were no vitals filed for this visit. There is no height or weight on file to calculate BMI. Constitutional Well developed. Well nourished.  Vascular Dorsalis pedis pulses palpable bilaterally. Posterior tibial pulses palpable bilaterally. Capillary refill normal to all digits.  No cyanosis or clubbing noted. Pedal hair growth normal.  Neurologic Normal speech. Oriented to person, place, and time. Epicritic sensation to light touch grossly present bilaterally.  Dermatologic Nails well groomed and normal in appearance. No open wounds. No skin lesions.  Orthopedic: No further pain on palpation to the left ankle.  No further pain with range of motion of the ankle joint no deep intra-articular pain noted.  No pain at the  Achilles tendon peroneal tendon posterior tibial tendon.   Radiographs: None Assessment:   1. Capsulitis of ankle, left   2. Pes cavus    Plan:  Patient was evaluated and treated and all questions answered.  Left ankle capsulitis with underlying pes cavus deformity -Clinically healed discharge injection at this time I discussed prevention technique if any foot and ankle issues or in the future of asked her to come back and see me.  She states  understanding   Pes cavus -I explained to patient the etiology of pes this and relationship with Planter fasciitis/arch pain and various treatment options were discussed.  Given patient foot structure in the setting of Planter fasciitis/arch pain I believe patient will benefit from custom-made orthotics to help control the hindfoot motion support the arch of the foot and take the stress away from plantar fascial.  Patient agrees with the plan like to proceed with orthotics -Orthotics were dispensed functioning well.   No follow-ups on file.

## 2022-02-22 DIAGNOSIS — M25511 Pain in right shoulder: Secondary | ICD-10-CM | POA: Diagnosis not present

## 2022-02-22 DIAGNOSIS — M48061 Spinal stenosis, lumbar region without neurogenic claudication: Secondary | ICD-10-CM | POA: Diagnosis not present

## 2022-02-22 NOTE — Progress Notes (Signed)
[facility] PREP Weekly Session  Patient Details  Name: [name] MRN: [id number] Date of Birth: Apr 03, 1950 Age: 71 y.o. PCP: Thana Ates, MD  Vitals:   02/21/22 1430  Weight: 257 lb (116.6 kg)     [facility] Weekly seesion - 02/22/22 1100       [facility] "PREP" Location   [facility]      Weekly Session   Topic Discussed Stress management and problem solving   meditation   Minutes exercised this week 215 minutes    Classes attended to date 12             Bonnye Fava 02/22/2022, 11:32 AM

## 2022-02-23 ENCOUNTER — Other Ambulatory Visit (HOSPITAL_COMMUNITY): Payer: Self-pay | Admitting: Orthopedic Surgery

## 2022-02-23 DIAGNOSIS — S63659A Sprain of metacarpophalangeal joint of unspecified finger, initial encounter: Secondary | ICD-10-CM

## 2022-02-23 DIAGNOSIS — G5621 Lesion of ulnar nerve, right upper limb: Secondary | ICD-10-CM

## 2022-02-24 ENCOUNTER — Ambulatory Visit
Admission: RE | Admit: 2022-02-24 | Discharge: 2022-02-24 | Disposition: A | Discharge status: Home / Self Care | Payer: 59 | Source: Ambulatory Visit | Attending: Internal Medicine | Admitting: Internal Medicine

## 2022-02-24 DIAGNOSIS — R928 Other abnormal and inconclusive findings on diagnostic imaging of breast: Secondary | ICD-10-CM

## 2022-02-24 DIAGNOSIS — R921 Mammographic calcification found on diagnostic imaging of breast: Secondary | ICD-10-CM | POA: Diagnosis not present

## 2022-02-28 DIAGNOSIS — M48062 Spinal stenosis, lumbar region with neurogenic claudication: Secondary | ICD-10-CM | POA: Diagnosis not present

## 2022-03-02 DIAGNOSIS — M25511 Pain in right shoulder: Secondary | ICD-10-CM | POA: Diagnosis not present

## 2022-03-03 ENCOUNTER — Ambulatory Visit (HOSPITAL_COMMUNITY)
Admission: RE | Admit: 2022-03-03 | Discharge: 2022-03-03 | Disposition: A | Discharge status: Home / Self Care | Payer: 59 | Source: Ambulatory Visit | Attending: Orthopedic Surgery | Admitting: Orthopedic Surgery

## 2022-03-03 ENCOUNTER — Other Ambulatory Visit: Payer: Self-pay

## 2022-03-03 DIAGNOSIS — G5621 Lesion of ulnar nerve, right upper limb: Secondary | ICD-10-CM | POA: Insufficient documentation

## 2022-03-03 DIAGNOSIS — S63659A Sprain of metacarpophalangeal joint of unspecified finger, initial encounter: Secondary | ICD-10-CM | POA: Insufficient documentation

## 2022-03-03 DIAGNOSIS — M25442 Effusion, left hand: Secondary | ICD-10-CM | POA: Diagnosis not present

## 2022-03-03 DIAGNOSIS — M19042 Primary osteoarthritis, left hand: Secondary | ICD-10-CM | POA: Diagnosis not present

## 2022-03-03 DIAGNOSIS — R600 Localized edema: Secondary | ICD-10-CM | POA: Diagnosis not present

## 2022-03-04 ENCOUNTER — Other Ambulatory Visit (HOSPITAL_COMMUNITY): Payer: Self-pay

## 2022-03-08 DIAGNOSIS — S63659A Sprain of metacarpophalangeal joint of unspecified finger, initial encounter: Secondary | ICD-10-CM | POA: Diagnosis not present

## 2022-03-09 ENCOUNTER — Other Ambulatory Visit (HOSPITAL_COMMUNITY): Payer: Self-pay

## 2022-03-15 ENCOUNTER — Other Ambulatory Visit (HOSPITAL_COMMUNITY): Payer: Self-pay

## 2022-03-16 ENCOUNTER — Other Ambulatory Visit: Payer: Self-pay

## 2022-03-16 ENCOUNTER — Other Ambulatory Visit (HOSPITAL_COMMUNITY): Payer: Self-pay

## 2022-03-18 ENCOUNTER — Other Ambulatory Visit (HOSPITAL_COMMUNITY): Payer: Self-pay

## 2022-03-22 NOTE — Progress Notes (Signed)
[facility] PREP Weekly Session  Patient Details  Name: [name] MRN: [id number] Date of Birth: 14-Jul-1950 Age: 72 y.o. PCP: Charlane Ferretti, MD  Vitals:   03/21/22 1430  Weight: 257 lb (116.6 kg)     [facility] Weekly seesion - 03/22/22 1500       [facility] "PREP" Location   [facility] "PREP" Location [facility]      Weekly Session   Topic Discussed Other   portions   Minutes exercised this week 105 minutes    Classes attended to date 15             Barnett Hatter 03/22/2022, 3:18 PM

## 2022-03-23 DIAGNOSIS — E113511 Type 2 diabetes mellitus with proliferative diabetic retinopathy with macular edema, right eye: Secondary | ICD-10-CM | POA: Diagnosis not present

## 2022-03-29 ENCOUNTER — Other Ambulatory Visit: Payer: Self-pay | Admitting: Cardiology

## 2022-03-29 ENCOUNTER — Other Ambulatory Visit (HOSPITAL_COMMUNITY): Payer: Self-pay

## 2022-03-29 ENCOUNTER — Other Ambulatory Visit: Payer: Self-pay | Admitting: Cardiovascular Disease

## 2022-03-29 DIAGNOSIS — I480 Paroxysmal atrial fibrillation: Secondary | ICD-10-CM

## 2022-03-29 MED ORDER — RIVAROXABAN 20 MG PO TABS
20.0000 mg | ORAL_TABLET | Freq: Every day | ORAL | 1 refills | Status: DC
  Filled 2022-04-07: qty 90, 90d supply, fill #0
  Filled 2022-07-12: qty 90, 90d supply, fill #1

## 2022-03-29 MED ORDER — FLECAINIDE ACETATE 100 MG PO TABS
100.0000 mg | ORAL_TABLET | Freq: Two times a day (BID) | ORAL | 1 refills | Status: DC
  Filled 2022-04-07: qty 180, 90d supply, fill #0
  Filled 2022-07-12: qty 180, 90d supply, fill #1

## 2022-03-29 NOTE — Progress Notes (Signed)
[facility] PREP Weekly Session  Patient Details  Name: [name] MRN: [id number] Date of Birth: [birth year] Age: 72 y.o. PCP: Charlane Ferretti, MD  Vitals:   03/28/22 1602  Weight: 255 lb (115.7 kg)     [facility] Weekly seesion - 03/29/22 1600       [facility] "PREP" Location   [facility] "PREP" Location [facility]      Weekly Session   Topic Discussed Finding support    Minutes exercised this week 120 minutes    Classes attended to date 17             Barnett Hatter 03/29/2022, 4:03 PM

## 2022-03-29 NOTE — Telephone Encounter (Signed)
Prescription refill request for Xarelto received.  Indication:Afib  Last office visit:12/23/21 Marlene Lard)  Weight: 116.6kg Age: 72 Scr: 1.21 (08/04/21)  CrCl: 76.64ml/min  Appropriate dose and refill sent to requested pharmacy.

## 2022-03-30 ENCOUNTER — Other Ambulatory Visit (HOSPITAL_COMMUNITY): Payer: Self-pay

## 2022-03-30 DIAGNOSIS — E113512 Type 2 diabetes mellitus with proliferative diabetic retinopathy with macular edema, left eye: Secondary | ICD-10-CM | POA: Diagnosis not present

## 2022-03-30 MED ORDER — LEVOTHYROXINE SODIUM 137 MCG PO TABS
137.0000 ug | ORAL_TABLET | Freq: Every morning | ORAL | 3 refills | Status: DC
  Filled 2022-03-30: qty 90, 90d supply, fill #0
  Filled 2022-07-12: qty 90, 90d supply, fill #1
  Filled 2022-10-11: qty 90, 90d supply, fill #2
  Filled 2023-01-09: qty 90, 90d supply, fill #3

## 2022-03-30 MED ORDER — HYDRALAZINE HCL 25 MG PO TABS
ORAL_TABLET | ORAL | 0 refills | Status: DC
  Filled 2022-03-30: qty 180, 90d supply, fill #0

## 2022-03-30 MED ORDER — XIGDUO XR 10-1000 MG PO TB24
1.0000 | ORAL_TABLET | Freq: Every day | ORAL | 3 refills | Status: DC
  Filled 2022-03-30: qty 90, 90d supply, fill #0
  Filled 2022-07-12: qty 90, 90d supply, fill #1
  Filled 2022-10-11: qty 90, 90d supply, fill #2
  Filled 2023-01-09: qty 90, 90d supply, fill #3

## 2022-03-31 ENCOUNTER — Other Ambulatory Visit (HOSPITAL_COMMUNITY): Payer: Self-pay

## 2022-04-04 NOTE — Progress Notes (Signed)
Cardiology Office Note    Date:  04/13/2022   ID:  AKHILA Gonzales, DOB [birth year], MRN [id number]   PCP:  Charlane Ferretti, MD   [place]  Cardiologist:  [name], MD   EP/Afib:  Marlene Lard   History of Present Illness:  [name] is a 72 y.o. female with history of PAF on flecainide Xarelto and beta-blocker, hypertension, HLD, obesity.  11/04/20 had ETT for AAT r/o pro arrhythmia She had poor exercise tolerance and did not meet target heart rate as well as hypertensive response to exercise.  No stress-induced arrhythmias or ischemic changes.  Subsequent myovue 07/20/21 with no ischemia and EF  57%   Seen by PA April 2023 with some dyspnea and LE edema. 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 her there when I had my hip replaced    Past Medical History:  Diagnosis Date   A-fib ([place])    Anemia    hx of   Arthritis    Chest pain    Constipation    Depression    Diabetes mellitus ORAL MEDS   Diabetic retinopathy ([place])    Dyspnea    with minimal exertion-deconditioned   Dyspnea    Dysrhythmia    a-fib   Food allergy    GERD (gastroesophageal reflux disease)    occasional   Gout    History of kidney stones    Hypercholesteremia    Hyperlipidemia    Hypertension    Hypertensive kidney disease    Hypothyroidism    Knee pain, right    Left arm numbness DUE TO CERVICAL PINCHED NERVE   Obesity    OSA on CPAP    uses CPAP nightly   Osteoarthritis    Palpitations    Pinched nerve in neck    Pleurisy    Pneumonia    hx of   PONV (postoperative nausea and vomiting)    for 3-4 days after general anesthesia approx 10-12 years ago   Sciatica    Swelling of knee joint, right     Synovitis of knee RIGHT   Trigger finger, left    left index   Vitamin D deficiency     Past Surgical History:  Procedure Laterality Date   CESAREAN SECTION  X3   KNEE ARTHROSCOPY  04/20/2011   Procedure: ARTHROSCOPY KNEE;  Surgeon: Gearlean Alf, MD;  Location: [place];  Service: Orthopedics;  Laterality: Right;  WITH SYNOVECTOMY   LEFT CARPAL TUNNEL / LEFT MIDDLE & RING FINGER TRIGGER RELEASE  08-26-2008   LEFT SHOULDER ARTHROSCOPY W/ DEBRIDEMENT  09-09-2003   LEFT SHOULDER ARTHROSCOPY/ LEFT THUMB TRIGGER RELEASE  02-22-2005   PHOTOCOAGULATION WITH LASER Right 03/20/2018   Procedure: PHOTOCOAGULATION WITH LASER;  Surgeon: Jalene Mullet, MD;  Location: [place];  Service: Ophthalmology;  Laterality: Right;   PULLEY RELEASE LEFT LONG FINGER  07-14-2009   RADIAL HEAD ARTHROPLASTY Right 06/17/2018   Procedure: RADIAL HEAD ARTHROPLASTY;  Surgeon: Hiram Gash, MD;  Location: [place];  Service: Orthopedics;  Laterality: Right;   RIGHT CARPAL TUNNEL/ RIGHT THUMB TRIGGER RELEASE'S  11-28-2006   RIGHT SHOULDER ARTHROSCOPY W/ ROTATOR CUFF REPAIR  01-13-2004   SHOULDER ARTHROSCOPY DISTAL CLAVICLE EXCISION AND OPEN ROTATOR CUFF REPAIR  09-07-2004   LEFT   SHOULDER ARTHROSCOPY W/ ACROMIAL REPAIR  11-29-2005   LEFT   TONSILLECTOMY AND ADENOIDECTOMY  child   TOTAL KNEE ARTHROPLASTY  12-14-2009   RIGHT   TOTAL KNEE ARTHROPLASTY Left 11/05/2012   Procedure: LEFT TOTAL KNEE ARTHROPLASTY;  Surgeon: Gearlean Alf, MD;  [facility];  Service: Orthopedics;  Laterality: Left;   TOTAL KNEE REVISION Right 07/29/2016   Procedure: RIGHT KNEE POLY-LINER EXCHANGE;  Surgeon: Mcarthur Rossetti, MD;  [facility];  Service: Orthopedics;  Laterality: Right;   TRIGGER FINGER RELEASE Right 02/21/2013   Procedure: RIGHT RING A-1 PULLEY RELEASE    (MINOR PROCEDURE) ;  Surgeon: Cammie Sickle., MD;  Location: [facility];  Service: Orthopedics;  Laterality: Right;    TRIGGER FINGER RELEASE Left 09/22/2016   Procedure: RELEASE TRIGGER FINGER LEFT INDEX FINGER;  Surgeon: Mcarthur Rossetti, MD;  Location: [place];  Service: Orthopedics;  Laterality: Left;   TRIGGER FINGER RELEASE Right 01/20/2017   Procedure: RELEASE TRIGGER FINGER/A-1 PULLEY RIGHT INDEX FINGER;  Surgeon: Melrose Nakayama, MD;  Location: [place];  Service: Orthopedics;  Laterality: Right;   VITRECTOMY 25 GAUGE WITH SCLERAL BUCKLE Right 03/20/2018   Procedure: RIGHT EYE VITRECTOMY WITH  ENDOLASER PARENTAL PHOTOCOAGULATION 25 GAUGE;  Surgeon: Jalene Mullet, MD;  Location: [place];  Service: Ophthalmology;  Laterality: Right;   WRIST ARTHROSCOPY WITH DEBRIDEMENT Right 05/01/2019   Procedure: WRIST ARTHROSCOPY WITH DEBRIDEMENT;  Surgeon: Charlotte Crumb, MD;  Location: [place];  Service: Orthopedics;  Laterality: Right;    Current Medications: Current Meds  Medication Sig   Aflibercept (EYLEA) 2 MG/0.05ML SOLN See admin instructions. Every six weeks   amLODipine (NORVASC) 5 MG tablet Take 1 tablet (5 mg total) by mouth daily.   atorvastatin (LIPITOR) 40 MG tablet Take 1 tablet (40 mg total) by mouth daily.   cholecalciferol (VITAMIN D3) 25 MCG (1000 UT) tablet Take 1,000 Units by mouth daily.   Dapagliflozin Pro-metFORMIN ER (XIGDUO XR) 12-998 MG TB24 Take 1 tablet by mouth daily.   Dulaglutide (TRULICITY) 4.5 VO/1.6WV SOPN Inject 4.5 mg into the skin once a week.   DULoxetine (CYMBALTA) 60 MG capsule Take 1 capsule (60 mg total) by mouth daily.   flecainide (TAMBOCOR) 100 MG tablet Take 1 tablet (100 mg total) by mouth 2 (two) times daily.   hydrALAZINE (APRESOLINE) 25 MG tablet TAKE 1 TABLET BY MOUTH TWO TIMES DAILY WITH BREAKFAST AND DINNER   indomethacin (INDOCIN) 50 MG capsule Take 1 capsule (50 mg total) by mouth 2(two) to 3 (three) times daily as needed for gout pain   insulin glargine-yfgn (SEMGLEE, YFGN,) 100 UNIT/ML Pen Inject 75 units into the skin  twice a day for 90 days.   levothyroxine (SYNTHROID) 137 MCG tablet Take 1 tablet (137 mcg total) by mouth every morning on an empty stomach   losartan (COZAAR) 50 MG tablet Take 1 tablet (50 mg total) by mouth daily.   Melatonin 5 MG CAPS Take 5-10 mg by mouth at bedtime as needed (sleep).    metoprolol tartrate (LOPRESSOR) 50 MG tablet Take 1 tablet (50 mg total) by mouth 2 (two) times daily. Please keep scheduled appointment for additional refills.   omeprazole (PRILOSEC) 20 MG capsule Take 1 capsule (20 mg total) by mouth daily.   rivaroxaban (XARELTO) 20 MG TABS tablet Take 1 tablet (20 mg total)  by mouth daily with supper.   spironolactone (ALDACTONE) 25 MG tablet Take 1/2 tablet mouth every other day.     Allergies:   Actos [pioglitazone hydrochloride], Avandia [rosiglitazone], Tetanus toxoids, Pioglitazone, and Food   Social History   Socioeconomic History   Marital status: Single    Spouse name: Not on file   Number of children: Not on file   Years of education: Not on file   Highest education level: Not on file  Occupational History   Not on file  Tobacco Use   Smoking status: Former    Years: 5.00    Types: Cigarettes    Quit date: 04/12/1977    Years since quitting: 45.0   Smokeless tobacco: Never   Tobacco comments:    Former smoker 10/22/21  Vaping Use   Vaping Use: Never used  Substance and Sexual Activity   Alcohol use: No   Drug use: No   Sexual activity: Not on file  Other Topics Concern   Not on file  Social History Narrative   Not on file   Social Determinants of Health   Financial Resource Strain: Not on file  Food Insecurity: Not on file  Transportation Needs: Not on file  Physical Activity: Not on file  Stress: Not on file  Social Connections: Not on file     Family History:  The patient's  family history includes Alcoholism in her father; Cancer in her father; Diabetes in her mother; Heart attack in her mother; Hypertension in her mother;  Liver disease in her father; Obesity in her mother; Sleep apnea in her father; Thyroid disease in her mother.   ROS:   Please see the history of present illness.    ROS All other systems reviewed and are negative.   PHYSICAL EXAM:   VS:  BP (!) 167/77   Pulse 69   Ht 5\' 2"  (1.575 m)   Wt 258 lb (117 kg)   BMI 47.19 kg/m   Physical Exam  GEN: obese, in no acute distress  Neck: no JVD, carotid bruits, or masses Cardiac:RRR; no murmurs, rubs, or gallops  Respiratory:  clear to auscultation bilaterally, normal work of breathing GI: soft, nontender, nondistended, + BS Ext: trace edema R>L otherwise without cyanosis, clubbing, or Good distal pulses bilaterally Neuro:  Alert and Oriented x 3,  Psych: euthymic mood, full affect  Wt Readings from Last 3 Encounters:  04/13/22 258 lb (117 kg)  04/11/22 260 lb (117.9 kg)  04/04/22 261 lb (118.4 kg)      Studies/Labs Reviewed:   EKG:  EKG is not ordered today   Recent Labs: 07/07/2021: ALT 17; Hemoglobin 12.3; NT-Pro BNP 334; Platelets 361; TSH 0.583 08/04/2021: BUN 31; Creatinine, Ser 1.21; Potassium 4.7; Sodium 143   Lipid Panel    Component Value Date/Time   CHOL 126 07/18/2017 1112   TRIG 98 07/18/2017 1112   HDL 43 07/18/2017 1112   LDLCALC 63 07/18/2017 1112    Additional studies/ records that were reviewed today include:  NST 07/20/21    Findings are consistent with prior myocardial infarction. The study is low risk.   No ST deviation was noted.   LV perfusion is abnormal. There is no evidence of ischemia. There is evidence of infarction. Defect 1: There is a small defect with mild reduction in uptake present in the apical to mid inferolateral location(s) that is fixed. There is abnormal wall motion in the defect area. Consistent with infarction.   Left ventricular function  is normal. Nuclear stress EF: 57 %. The left ventricular ejection fraction is normal (55-65%). End diastolic cavity size is mildly enlarged. End  systolic cavity size is mildly enlarged.   Prior study available for comparison from 07/21/2016.   Fixed perfusion defect in mid to apical inferolateral wall with wall motion abnormality, consistent with infarct Normal EF (57%) Low risk study.  No ischemia   Echo 07/19/21 IMPRESSIONS     1. Left ventricular ejection fraction, by estimation, is 55 to 60%. The  left ventricle has normal function. The left ventricle has no regional  wall motion abnormalities. There is mild left ventricular hypertrophy.  Left ventricular diastolic parameters  are consistent with Grade I diastolic dysfunction (impaired relaxation).   2. Right ventricular systolic function is normal. The right ventricular  size is normal. There is normal pulmonary artery systolic pressure. The  estimated right ventricular systolic pressure is 29.2 mmHg.   3. Left atrial size was mildly dilated.   4. Right atrial size was mildly dilated.   5. The mitral valve is normal in structure. Trivial mitral valve  regurgitation. No evidence of mitral stenosis. Moderate mitral annular  calcification.   6. The aortic valve is tricuspid. There is mild calcification of the  aortic valve. Aortic valve regurgitation is not visualized. No aortic  stenosis is present.   7. The inferior vena cava is normal in size with greater than 50%  respiratory variability, suggesting right atrial pressure of 3 mmHg.   FINDINGS   Left Ventricle: Left ventricular ejection fraction, by estimation, is 55  to 60%. The left ventricle has normal function. The left ventricle has no  regional wall motion abnormalities. The left ventricular internal cavity  size was normal in size. There is   mild left ventricular hypertrophy. Left ventricular diastolic parameters  are consistent with Grade I diastolic dysfunction (impaired relaxation).   Right Ventricle: The right ventricular size is normal. No increase in  right ventricular wall thickness. Right ventricular  systolic function is  normal. There is normal pulmonary artery systolic pressure. The tricuspid  regurgitant velocity is 2.56 m/s, and   with an assumed right atrial pressure of 3 mmHg, the estimated right  ventricular systolic pressure is 29.2 mmHg.   Left Atrium: Left atrial size was mildly dilated.   Right Atrium: Right atrial size was mildly dilated.   Pericardium: There is no evidence of pericardial effusion.   Mitral Valve: The mitral valve is normal in structure. Moderate mitral  annular calcification. Trivial mitral valve regurgitation. No evidence of  mitral valve stenosis.   Tricuspid Valve: The tricuspid valve is normal in structure. Tricuspid  valve regurgitation is mild.   Aortic Valve: The aortic valve is tricuspid. There is mild calcification  of the aortic valve. Aortic valve regurgitation is not visualized. No  aortic stenosis is present.   Pulmonic Valve: The pulmonic valve was normal in structure. Pulmonic valve  regurgitation is trivial.   Aorta: The aortic Fritsche is normal in size and structure.   Venous: The inferior vena cava is normal in size with greater than 50%  respiratory variability, suggesting right atrial pressure of 3 mmHg.   IAS/Shunts: No atrial level shunt detected by color flow Doppler.      Risk Assessment/Calculations:    CHA2DS2-VASc Score = 5   This indicates a 7.2% annual risk of stroke. The patient's score is based upon: CHF History: 1 HTN History: 1 Diabetes History: 1 Stroke History: 0 Vascular Disease History: 0  Age Score: 1 Gender Score: 1   PLAN:  In order of problems listed above:  Diastolic CHF:  stable likely more side effects from norvasc and obesity TTE normal 07/19/21 and BNP only 334 just above normal upper limit    Chest Pain:  resolved with normal myovue 07/20/21    PAF on flecainide Xarelto and beta-blocker- improved on higher dose flecainide    HTN:   Well controlled.  Continue current medications and low  sodium Dash type diet.     HLD LDL at goal on statin continue current dose of lipitor    Obesity weight loss recommended   DM-2 Discussed low carb diet.  Target hemoglobin A1c is 6.5 or less.  Continue current medications.  CKD Crt 1.21 stable f/u primary     F/U in  a year    Signed, Charlton Haws, MD  04/13/2022 3:30 PM    [facility] [address] [place], [place], [place]  [address] Phone: [phone]; Fax: [phone]

## 2022-04-06 NOTE — Progress Notes (Signed)
[facility] PREP Weekly Session  Patient Details  Name: [name] MRN: [id number] Date of Birth: February 18, 1951 Age: 72 y.o. PCP: Charlane Ferretti, MD  Vitals:   04/04/22 1430  Weight: 261 lb (118.4 kg)     [facility] Weekly seesion - 04/06/22 1200       [facility] "PREP" Location   [facility] "PREP" [facility]      Weekly Session   Topic Discussed Calorie breakdown    Minutes exercised this week 180 minutes    Classes attended to date 34             [place] 04/06/2022, 12:22 PM

## 2022-04-07 ENCOUNTER — Other Ambulatory Visit (HOSPITAL_COMMUNITY): Payer: Self-pay

## 2022-04-09 DIAGNOSIS — M25511 Pain in right shoulder: Secondary | ICD-10-CM | POA: Diagnosis not present

## 2022-04-11 NOTE — Progress Notes (Signed)
[facility] PREP Evaluation  Patient Details  Name: [name] MRN: [id number] Date of Birth: Aug 04, 1950 Age: 72 y.o. PCP: Charlane Ferretti, MD  Vitals:   04/11/22 1545  BP: 138/76  Pulse: 65  SpO2: 93%  Weight: 260 lb (117.9 kg)     [facility] Eval - 04/11/22 1600       [facility] "PREP" Location   [facility]" Location [facility]      Referral    Referring Provider [place]    Program Start Date --   PREP final date 04/11/22     Measurement   Waist Circumference 52 inches    Hip Circumference 54 inches      Information for Trainer   Goals Reset      Mobility and Daily Activities   I find it easy to walk up or down two or more flights of stairs. 1    I have no trouble taking out the trash. 2    I do housework such as vacuuming and dusting on my own without difficulty. 3    I can easily lift a gallon of milk (8lbs). 2    I can easily walk a mile. 1    I have no trouble reaching into high cupboards or reaching down to pick up something from the floor. 2    I do not have trouble doing out-door work such as Armed forces logistics/support/administrative officer, raking leaves, or gardening. 1      Mobility and Daily Activities   I feel younger than my age. 2    I feel independent. 3    I feel energetic. 2    I live an active life.  2    I feel strong. 2    I feel healthy. 2    I feel active as other people my age. 2      How fit and strong are you.   Fit and Strong Total Score 27            Past Medical History:  Diagnosis Date   A-fib ([place])    Anemia    hx of   Arthritis    Chest pain    Constipation    Depression    Diabetes mellitus ORAL MEDS   Diabetic retinopathy ([place])    Dyspnea    with minimal exertion-deconditioned   Dyspnea    Dysrhythmia    a-fib   Food allergy    GERD (gastroesophageal reflux disease)    occasional   Gout    History of kidney stones    Hypercholesteremia    Hyperlipidemia    Hypertension    Hypertensive kidney disease    Hypothyroidism    Knee pain, right     Left arm numbness DUE TO CERVICAL PINCHED NERVE   Obesity    OSA on CPAP    uses CPAP nightly   Osteoarthritis    Palpitations    Pinched nerve in neck    Pleurisy    Pneumonia    hx of   PONV (postoperative nausea and vomiting)    for 3-4 days after general anesthesia approx 10-12 years ago   Sciatica    Swelling of knee joint, right    Synovitis of knee RIGHT   Trigger finger, left    left index   Vitamin D deficiency    Past Surgical History:  Procedure Laterality Date   CESAREAN SECTION  X3   KNEE ARTHROSCOPY  04/20/2011   Procedure:  ARTHROSCOPY KNEE;  Surgeon: Gearlean Alf, MD;  Location: [facility];  Service: Orthopedics;  Laterality: Right;  WITH SYNOVECTOMY   LEFT CARPAL TUNNEL / LEFT MIDDLE & RING FINGER TRIGGER RELEASE  08-26-2008   LEFT SHOULDER ARTHROSCOPY W/ DEBRIDEMENT  09-09-2003   LEFT SHOULDER ARTHROSCOPY/ LEFT THUMB TRIGGER RELEASE  02-22-2005   PHOTOCOAGULATION WITH LASER Right 03/20/2018   Procedure: PHOTOCOAGULATION WITH LASER;  Surgeon: Jalene Mullet, MD;  Location: [place];  Service: Ophthalmology;  Laterality: Right;   PULLEY RELEASE LEFT LONG FINGER  07-14-2009   RADIAL HEAD ARTHROPLASTY Right 06/17/2018   Procedure: RADIAL HEAD ARTHROPLASTY;  Surgeon: Hiram Gash, MD;  Location: [place];  Service: Orthopedics;  Laterality: Right;   RIGHT CARPAL TUNNEL/ RIGHT THUMB TRIGGER RELEASE'S  11-28-2006   RIGHT SHOULDER ARTHROSCOPY W/ ROTATOR CUFF REPAIR  01-13-2004   SHOULDER ARTHROSCOPY DISTAL CLAVICLE EXCISION AND OPEN ROTATOR CUFF REPAIR  09-07-2004   LEFT   SHOULDER ARTHROSCOPY W/ ACROMIAL REPAIR  11-29-2005   LEFT   TONSILLECTOMY AND ADENOIDECTOMY  child   TOTAL KNEE ARTHROPLASTY  12-14-2009   RIGHT   TOTAL KNEE ARTHROPLASTY Left 11/05/2012   Procedure: LEFT TOTAL KNEE ARTHROPLASTY;  Surgeon: Gearlean Alf, MD;  [facility];  Service: Orthopedics;  Laterality: Left;   TOTAL KNEE REVISION Right 07/29/2016   Procedure: RIGHT KNEE  POLY-LINER EXCHANGE;  Surgeon: Mcarthur Rossetti, MD;  [facility];  Service: Orthopedics;  Laterality: Right;   TRIGGER FINGER RELEASE Right 02/21/2013   Procedure: RIGHT RING A-1 PULLEY RELEASE    (MINOR PROCEDURE) ;  Surgeon: Cammie Sickle., MD;  Location[facility];  Service: Orthopedics;  Laterality: Right;   TRIGGER FINGER RELEASE Left 09/22/2016   Procedure: RELEASE TRIGGER FINGER LEFT INDEX FINGER;  Surgeon: Mcarthur Rossetti, MD;  Location: [place];  Service: Orthopedics;  Laterality: Left;   TRIGGER FINGER RELEASE Right 01/20/2017   Procedure: RELEASE TRIGGER FINGER/A-1 PULLEY RIGHT INDEX FINGER;  Surgeon: Melrose Nakayama, MD;  Location: [place];  Service: Orthopedics;  Laterality: Right;   VITRECTOMY 25 GAUGE WITH SCLERAL BUCKLE Right 03/20/2018   Procedure: RIGHT EYE VITRECTOMY WITH  ENDOLASER PARENTAL PHOTOCOAGULATION 25 GAUGE;  Surgeon: Jalene Mullet, MD;  Location: [place];  Service: Ophthalmology;  Laterality: Right;   WRIST ARTHROSCOPY WITH DEBRIDEMENT Right 05/01/2019   Procedure: WRIST ARTHROSCOPY WITH DEBRIDEMENT;  Surgeon: Charlotte Crumb, MD;  Location: [place];  Service: Orthopedics;  Laterality: Right;   Social History   Tobacco Use  Smoking Status Former   Years: 5.00   Types: Cigarettes   Quit date: 04/12/1977   Years since quitting: 45.0  Smokeless Tobacco Never  Tobacco Comments   Former smoker 10/22/21   Attended 21 classes, 9 of 12 educational sessions Fit testing: Cardio march test: 182 to 230 Sit to stand: 13 to 11 Bicep curl: 13 to 18 Balance improved  Continues to have SOB with walking. Able to use seated cardio equipment without issue. Plans to keep exercising and plans on restarting pool exercises.   Barnett Hatter 04/11/2022, 4:38 PM

## 2022-04-13 ENCOUNTER — Encounter: Payer: Self-pay | Admitting: Cardiovascular Disease

## 2022-04-13 ENCOUNTER — Ambulatory Visit: Payer: 59 | Attending: Cardiovascular Disease | Admitting: Cardiovascular Disease

## 2022-04-13 VITALS — BP 167/77 | HR 69 | Ht 62.0 in | Wt 258.0 lb

## 2022-04-13 DIAGNOSIS — I10 Essential (primary) hypertension: Secondary | ICD-10-CM | POA: Diagnosis not present

## 2022-04-13 DIAGNOSIS — R0609 Other forms of dyspnea: Secondary | ICD-10-CM

## 2022-04-13 DIAGNOSIS — I480 Paroxysmal atrial fibrillation: Secondary | ICD-10-CM

## 2022-04-13 NOTE — Patient Instructions (Signed)
Medication Instructions:  Your physician recommends that you continue on your current medications as directed. Please refer to the Current Medication list given to you today.  *If you need a refill on your cardiac medications before your next appointment, please call your pharmacy*  Lab Work: If you have labs (blood work) drawn today and your tests are completely normal, you will receive your results only by: MyChart Message (if you have MyChart) OR A paper copy in the mail If you have any lab test that is abnormal or we need to change your treatment, we will call you to review the results.  Testing/Procedures: None ordered today.  Follow-Up: At [facility], you and your health needs are our priority.  As part of our continuing mission to provide you with exceptional heart care, we have created designated Provider Care Teams.  These Care Teams include your primary Cardiologist (physician) and Advanced Practice Providers (APPs -  Physician Assistants and Nurse Practitioners) who all work together to provide you with the care you need, when you need it.  We recommend signing up for the patient portal called "MyChart".  Sign up information is provided on this After Visit Summary.  MyChart is used to connect with patients for Virtual Visits (Telemedicine).  Patients are able to view lab/test results, encounter notes, upcoming appointments, etc.  Non-urgent messages can be sent to your provider as well.   To learn more about what you can do with MyChart, go to [email or web address].    Your next appointment:   1 year(s)  Provider:   Peter Nishan, MD      

## 2022-04-18 ENCOUNTER — Other Ambulatory Visit: Payer: Self-pay

## 2022-04-18 ENCOUNTER — Other Ambulatory Visit (HOSPITAL_COMMUNITY): Payer: Self-pay

## 2022-04-19 ENCOUNTER — Other Ambulatory Visit: Payer: Self-pay

## 2022-04-19 ENCOUNTER — Other Ambulatory Visit (HOSPITAL_COMMUNITY): Payer: Self-pay

## 2022-04-26 ENCOUNTER — Other Ambulatory Visit: Payer: Self-pay | Admitting: Orthopedic Surgery

## 2022-04-26 DIAGNOSIS — S63659A Sprain of metacarpophalangeal joint of unspecified finger, initial encounter: Secondary | ICD-10-CM | POA: Diagnosis not present

## 2022-04-27 ENCOUNTER — Telehealth: Payer: Self-pay

## 2022-04-27 NOTE — Telephone Encounter (Signed)
Pharmacy please advise on holding Xarelto prior to metacarpal phalangeal joint synovectomy  scheduled for 06/06/2022. Thank you.

## 2022-04-27 NOTE — Telephone Encounter (Signed)
   Pre-operative Risk Assessment    Patient Name: [name]  DOB: [birth year] MRN: [id number]      Request for Surgical Clearance    Procedure:  Left Index Finger Left Long Finger Metacarpal Phalangeal Joint Synovectomy  Date of Surgery:  Clearance 06/06/22                                 Surgeon:  Dr Charlotte Crumb Surgeon's Group or Practice Name:  [facility] Phone number:  [phone] Fax number:  [phone]   Type of Clearance Requested:   - Medical  - Pharmacy:  Hold Rivaroxaban (Xarelto)     Type of Anesthesia:   Axillary Block / MAC   Additional requests/questions:   n/a  Toma Deiters   04/27/2022, 11:05 AM

## 2022-04-28 DIAGNOSIS — M48062 Spinal stenosis, lumbar region with neurogenic claudication: Secondary | ICD-10-CM | POA: Diagnosis not present

## 2022-04-28 NOTE — Telephone Encounter (Signed)
Patient with diagnosis of A Fib on Xarelto for anticoagulation.    Procedure: Left Index Finger Left Long Finger Metacarpal Phalangeal Joint Synovectomy  Date of procedure: 06/06/22   CHA2DS2-VASc Score = 5  This indicates a 7.2% annual risk of stroke. The patient's score is based upon: CHF History: 1 HTN History: 1 Diabetes History: 1 Stroke History: 0 Vascular Disease History: 0 Age Score: 1 Gender Score: 1   CrCl 76 mL/min Platelet count 361K  Per office protocol, patient can hold Xarelto for 2 days prior to procedure.    Patient will not need bridging with Lovenox (enoxaparin) around procedure.  **This guidance is not considered finalized until pre-operative APP has relayed final recommendations.**

## 2022-04-28 NOTE — Telephone Encounter (Signed)
Patient states she was returning call. Please advise

## 2022-04-28 NOTE — Telephone Encounter (Signed)
Message sent to pre op APP, to see if pt needs appt

## 2022-04-28 NOTE — Telephone Encounter (Signed)
   Name: [name]  DOB: May 31, 1950  MRN: [id number]   Primary Cardiologist: [name], MD  Chart reviewed as part of pre-operative protocol coverage. Patient was contacted 04/28/2022 in reference to pre-operative risk assessment for pending surgery as outlined below.  [name] was last seen on 04/13/22 by Dr. Johnsie Cancel.  Since that day, [name] has done well. She had a nonischemic nuclear stress test in 07/2021. She is able to complete 4.0 METS without angina.  Therefore, based on ACC/AHA guidelines, the patient would be at acceptable risk for the planned procedure without further cardiovascular testing.   The patient was advised that if she develops new symptoms prior to surgery to contact our office to arrange for a follow-up visit, and she verbalized understanding.  Per our clinical pharmD: Per office protocol, patient can hold Xarelto for 2 days prior to procedure.   I will route this recommendation to the requesting party via Epic fax function and remove from pre-op pool. Please call with questions.  Ledora Bottcher, PA 04/28/2022, 4:34 PM

## 2022-05-03 ENCOUNTER — Other Ambulatory Visit (HOSPITAL_COMMUNITY): Payer: Self-pay

## 2022-05-11 DIAGNOSIS — E113511 Type 2 diabetes mellitus with proliferative diabetic retinopathy with macular edema, right eye: Secondary | ICD-10-CM | POA: Diagnosis not present

## 2022-05-12 DIAGNOSIS — Z0289 Encounter for other administrative examinations: Secondary | ICD-10-CM

## 2022-05-18 ENCOUNTER — Other Ambulatory Visit (HOSPITAL_COMMUNITY): Payer: Self-pay

## 2022-05-18 DIAGNOSIS — M25511 Pain in right shoulder: Secondary | ICD-10-CM | POA: Diagnosis not present

## 2022-05-18 DIAGNOSIS — M85851 Other specified disorders of bone density and structure, right thigh: Secondary | ICD-10-CM | POA: Diagnosis not present

## 2022-05-18 DIAGNOSIS — I480 Paroxysmal atrial fibrillation: Secondary | ICD-10-CM | POA: Diagnosis not present

## 2022-05-18 DIAGNOSIS — E039 Hypothyroidism, unspecified: Secondary | ICD-10-CM | POA: Diagnosis not present

## 2022-05-18 DIAGNOSIS — Z794 Long term (current) use of insulin: Secondary | ICD-10-CM | POA: Diagnosis not present

## 2022-05-18 DIAGNOSIS — E1165 Type 2 diabetes mellitus with hyperglycemia: Secondary | ICD-10-CM | POA: Diagnosis not present

## 2022-05-18 DIAGNOSIS — I10 Essential (primary) hypertension: Secondary | ICD-10-CM | POA: Diagnosis not present

## 2022-05-18 DIAGNOSIS — M109 Gout, unspecified: Secondary | ICD-10-CM | POA: Diagnosis not present

## 2022-05-18 DIAGNOSIS — K219 Gastro-esophageal reflux disease without esophagitis: Secondary | ICD-10-CM | POA: Diagnosis not present

## 2022-05-18 DIAGNOSIS — E7800 Pure hypercholesterolemia, unspecified: Secondary | ICD-10-CM | POA: Diagnosis not present

## 2022-05-18 MED ORDER — GLUCOSE BLOOD VI STRP
ORAL_STRIP | 3 refills | Status: AC
  Filled 2022-05-18: qty 300, 90d supply, fill #0
  Filled 2022-08-14 – 2022-12-19 (×3): qty 300, 90d supply, fill #1
  Filled 2023-05-15: qty 300, 90d supply, fill #2

## 2022-05-19 ENCOUNTER — Other Ambulatory Visit (HOSPITAL_COMMUNITY): Payer: Self-pay

## 2022-05-19 MED ORDER — DULOXETINE HCL 60 MG PO CPEP
60.0000 mg | ORAL_CAPSULE | Freq: Every day | ORAL | 3 refills | Status: DC
  Filled 2022-05-19: qty 90, 90d supply, fill #0
  Filled 2022-08-14: qty 90, 90d supply, fill #1
  Filled 2022-11-16: qty 90, 90d supply, fill #2
  Filled 2023-02-13: qty 90, 90d supply, fill #3

## 2022-05-20 ENCOUNTER — Other Ambulatory Visit: Payer: Self-pay

## 2022-05-21 DIAGNOSIS — M25511 Pain in right shoulder: Secondary | ICD-10-CM | POA: Diagnosis not present

## 2022-05-23 DIAGNOSIS — M48062 Spinal stenosis, lumbar region with neurogenic claudication: Secondary | ICD-10-CM | POA: Diagnosis not present

## 2022-05-24 ENCOUNTER — Other Ambulatory Visit (HOSPITAL_COMMUNITY): Payer: Self-pay

## 2022-05-24 ENCOUNTER — Other Ambulatory Visit: Payer: Self-pay | Admitting: Physician Assistant

## 2022-05-24 ENCOUNTER — Other Ambulatory Visit: Payer: Self-pay

## 2022-05-24 MED ORDER — SPIRONOLACTONE 25 MG PO TABS
12.5000 mg | ORAL_TABLET | ORAL | 3 refills | Status: DC
  Filled 2022-05-24: qty 45, 180d supply, fill #0
  Filled 2022-05-24 – 2022-05-27 (×2): qty 22, 88d supply, fill #0
  Filled 2022-08-23: qty 22, 88d supply, fill #1
  Filled 2022-12-02: qty 22, 88d supply, fill #2
  Filled 2023-02-26: qty 22, 88d supply, fill #3

## 2022-05-26 ENCOUNTER — Other Ambulatory Visit: Payer: Self-pay | Admitting: Internal Medicine

## 2022-05-26 ENCOUNTER — Ambulatory Visit
Admission: RE | Admit: 2022-05-26 | Discharge: 2022-05-26 | Disposition: A | Discharge status: Home / Self Care | Payer: 59 | Source: Ambulatory Visit | Attending: Internal Medicine | Admitting: Internal Medicine

## 2022-05-26 DIAGNOSIS — R0609 Other forms of dyspnea: Secondary | ICD-10-CM

## 2022-05-27 ENCOUNTER — Other Ambulatory Visit (HOSPITAL_COMMUNITY): Payer: Self-pay

## 2022-05-30 ENCOUNTER — Encounter (INDEPENDENT_AMBULATORY_CARE_PROVIDER_SITE_OTHER): Payer: Self-pay | Admitting: Family Medicine

## 2022-05-30 ENCOUNTER — Encounter (INDEPENDENT_AMBULATORY_CARE_PROVIDER_SITE_OTHER): Payer: Self-pay | Admitting: *Deleted

## 2022-05-30 ENCOUNTER — Ambulatory Visit (INDEPENDENT_AMBULATORY_CARE_PROVIDER_SITE_OTHER): Payer: 59 | Admitting: Family Medicine

## 2022-05-30 VITALS — BP 151/65 | HR 71 | Temp 98.3°F | Ht 60.0 in | Wt 258.0 lb

## 2022-05-30 DIAGNOSIS — R0602 Shortness of breath: Secondary | ICD-10-CM | POA: Diagnosis not present

## 2022-05-30 DIAGNOSIS — Z1331 Encounter for screening for depression: Secondary | ICD-10-CM

## 2022-05-30 DIAGNOSIS — E559 Vitamin D deficiency, unspecified: Secondary | ICD-10-CM | POA: Diagnosis not present

## 2022-05-30 DIAGNOSIS — Z794 Long term (current) use of insulin: Secondary | ICD-10-CM

## 2022-05-30 DIAGNOSIS — Z6841 Body Mass Index (BMI) 40.0 and over, adult: Secondary | ICD-10-CM | POA: Insufficient documentation

## 2022-05-30 DIAGNOSIS — R5383 Other fatigue: Secondary | ICD-10-CM | POA: Diagnosis not present

## 2022-05-30 DIAGNOSIS — E11311 Type 2 diabetes mellitus with unspecified diabetic retinopathy with macular edema: Secondary | ICD-10-CM | POA: Diagnosis not present

## 2022-05-31 LAB — LIPID PANEL WITH LDL/HDL RATIO

## 2022-06-01 DIAGNOSIS — E113512 Type 2 diabetes mellitus with proliferative diabetic retinopathy with macular edema, left eye: Secondary | ICD-10-CM | POA: Diagnosis not present

## 2022-06-03 ENCOUNTER — Other Ambulatory Visit (HOSPITAL_COMMUNITY): Payer: Self-pay

## 2022-06-03 DIAGNOSIS — Z794 Long term (current) use of insulin: Secondary | ICD-10-CM | POA: Diagnosis not present

## 2022-06-03 DIAGNOSIS — E039 Hypothyroidism, unspecified: Secondary | ICD-10-CM | POA: Diagnosis not present

## 2022-06-03 DIAGNOSIS — E119 Type 2 diabetes mellitus without complications: Secondary | ICD-10-CM | POA: Diagnosis not present

## 2022-06-03 DIAGNOSIS — M85851 Other specified disorders of bone density and structure, right thigh: Secondary | ICD-10-CM | POA: Diagnosis not present

## 2022-06-03 DIAGNOSIS — Z87442 Personal history of urinary calculi: Secondary | ICD-10-CM | POA: Diagnosis not present

## 2022-06-03 DIAGNOSIS — M109 Gout, unspecified: Secondary | ICD-10-CM | POA: Diagnosis not present

## 2022-06-04 ENCOUNTER — Other Ambulatory Visit (HOSPITAL_COMMUNITY): Payer: Self-pay

## 2022-06-04 MED ORDER — FREESTYLE LIBRE 3 SENSOR MISC
4 refills | Status: AC
  Filled 2022-06-04: qty 2, 28d supply, fill #0
  Filled 2022-06-15: qty 6, 84d supply, fill #0
  Filled 2022-09-09: qty 6, 84d supply, fill #1
  Filled 2022-12-02: qty 6, 84d supply, fill #2
  Filled 2023-02-21: qty 6, 84d supply, fill #3
  Filled 2023-05-13: qty 6, 84d supply, fill #4

## 2022-06-04 MED ORDER — FREESTYLE LIBRE 3 READER DEVI
0 refills | Status: AC
  Filled 2022-06-15: qty 1, 14d supply, fill #0

## 2022-06-06 NOTE — Progress Notes (Unsigned)
Chief Complaint:   OBESITY [name] (MR# [id number]) is a 72 y.o. female who presents for evaluation and treatment of obesity and related comorbidities. Current BMI is Body mass index is 50.39 kg/m. [name] has been struggling with her weight for many years and has been unsuccessful in either losing weight, maintaining weight loss, or reaching her healthy weight goal.  [name]'s last visit with [name] was >3 years ago. She is ready to get back on track with her weight loss efforts.   [name]'s habits were reviewed today and are as follows: Her family eats meals together, her desired weight loss is 108 lbs, she has been heavy most of her life, she started gaining weight after childbirth, her heaviest weight ever was 260 pounds, she has significant food cravings issues, she frequently makes poor food choices, she frequently eats larger portions than normal, and she struggles with emotional eating.  Depression Screen [name]'s Food and Mood (modified PHQ-9) score was 14.  Subjective:   1. Other fatigue [name] admits to daytime somnolence and admits to waking up still tired. Patient has a history of symptoms of daytime fatigue and morning fatigue. [name] generally gets 5 or 7 hours of sleep per night, and states that she has nightime awakenings and generally restful sleep. Snoring is present. Apneic episodes are not present. Epworth Sleepiness Score is 6.   2. SOBOE (shortness of breath on exertion) [name] notes increasing shortness of breath with exercising and seems to be worsening over time with weight gain. She notes getting out of breath sooner with activity than she used to. This has not gotten worse recently. [name] denies shortness of breath at rest or orthopnea.  3. 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) [name]'s recent A1c was 8.1 at her PCP's office, but we are  unable to get her records. She knows when she gets hypoglycemic. Her fasting blood sugars usually below 120. She usually doesn't check her 2 hour postprandial.   4. Vitamin D deficiency [name] has a history of Vitamin D deficiency. She is due to have her labs rechecked, and she notes fatigue.   Assessment/Plan:   1. Other fatigue [name] does feel that her weight is causing her energy to be lower than it should be. Fatigue may be related to obesity, depression or many other causes. Labs will be ordered, and in the meanwhile, [name] will focus on self care including making healthy food choices, increasing physical activity and focusing on stress reduction.  2. SOBOE (shortness of breath on exertion) [name] does feel that she gets out of breath more easily that she used to when she exercises. [name]'s shortness of breath appears to be obesity related and exercise induced. She has agreed to work on weight loss and gradually increase exercise to treat her exercise induced shortness of breath. Will continue to monitor closely.  3. 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 [name] will start her Category 2 plan. She is to check her postprandial glucose readings and use glucose tablets if hypoglycemia develops. We will monitor her closely.   - CMP14+EGFR - Vitamin B12 - CBC with Differential/Platelet - Hemoglobin A1c - Insulin, random - Lipid Panel With LDL/HDL Ratio - VITAMIN D 25 Hydroxy (Vit-D Deficiency, Fractures) - TSH - Microalbumin /  creatinine urine ratio  4. Vitamin D deficiency We will recheck labs today, and we will follow-up at [name]'s next visit.   - VITAMIN D 25 Hydroxy (Vit-D Deficiency, Fractures)  5. Depression screening [name] had a positive depression screening. Depression is commonly associated with obesity and often results in  emotional eating behaviors. We will monitor this closely and work on CBT to help improve the non-hunger eating patterns. Referral to Psychology may be required if no improvement is seen as she continues in our clinic.  6. Obesity, Beginning BMI 50.5  7. Class 3 severe obesity with serious comorbidity and body mass index (BMI) of 50.0 to 59.9 in adult, unspecified obesity type [name]) [name] is currently in the action stage of change and her goal is to continue with weight loss efforts. I recommend [name] begin the structured treatment plan as follows:  She has agreed to the Category 2 Plan.  Exercise goals: No exercise has been prescribed for now, while we concentrate on nutritional changes.   Behavioral modification strategies: increasing lean protein intake, no skipping meals, and meal planning and cooking strategies.  She was informed of the importance of frequent follow-up visits to maximize her success with intensive lifestyle modifications for her multiple health conditions. She was informed we would discuss her lab results at her next visit unless there is a critical issue that needs to be addressed sooner. [name] agreed to keep her next visit at the agreed upon time to discuss these results.  Objective:   Blood pressure (!) 151/65, pulse 71, temperature 98.3 F (36.8 C), height 5' (1.524 m), weight 258 lb (117 kg), SpO2 90 %. Body mass index is 50.39 kg/m.  EKG: Normal sinus rhythm, rate 61 BPM.  Indirect Calorimeter completed today shows a VO2 of 289 and a REE of 1987.  Her calculated basal metabolic rate is [id number] thus her basal metabolic rate is better than expected.  General: Cooperative, alert, well developed, in no acute distress. HEENT: Conjunctivae and lids unremarkable. Cardiovascular: Regular rhythm.  Lungs: Normal work of breathing. Neurologic: No focal deficits.   Lab Results  Component Value Date   CREATININE 1.27 (H) 05/30/2022   BUN 34 (H) 05/30/2022   NA 139  05/30/2022   K 4.9 05/30/2022   CL 103 05/30/2022   CO2 17 (L) 05/30/2022   Lab Results  Component Value Date   ALT 13 05/30/2022   AST 19 05/30/2022   ALKPHOS 87 05/30/2022   BILITOT <0.2 05/30/2022   Lab Results  Component Value Date   HGBA1C 7.7 (H) 05/30/2022   HGBA1C 7.4 (H) 11/28/2017   HGBA1C 6.8 (H) 07/18/2017   HGBA1C 7.7 (H) 07/20/2016   HGBA1C 7.9 09/18/2014   Lab Results  Component Value Date   INSULIN 24.3 05/30/2022   Lab Results  Component Value Date   TSH 0.790 05/30/2022   Lab Results  Component Value Date   CHOL 111 05/30/2022   HDL 35 (L) 05/30/2022   LDLCALC 55 05/30/2022   TRIG 114 05/30/2022   Lab Results  Component Value Date   WBC 10.0 05/30/2022   HGB 12.4 05/30/2022   HCT 40.0 05/30/2022   MCV 88 05/30/2022   PLT 378 05/30/2022   No results found for: "IRON", "TIBC", "FERRITIN"  Attestation Statements:   Reviewed by clinician on day of visit: allergies, medications, problem list, medical history, surgical history, family history, social history, and previous encounter notes.  I have personally spent 47 minutes total time today in preparation,  patient care, and documentation for this visit, including the following: review of clinical lab tests; review of medical tests/procedures/services.  I, Trixie Dredge, am acting as transcriptionist for Dennard Nip, MD.  I have reviewed the above documentation for accuracy and completeness, and I agree with the above. - Dennard Nip, MD

## 2022-06-07 ENCOUNTER — Other Ambulatory Visit (HOSPITAL_BASED_OUTPATIENT_CLINIC_OR_DEPARTMENT_OTHER): Payer: Self-pay

## 2022-06-07 DIAGNOSIS — R0602 Shortness of breath: Secondary | ICD-10-CM

## 2022-06-07 LAB — CBC WITH DIFFERENTIAL/PLATELET
Basophils Absolute: 0 10*3/uL (ref 0.0–0.2)
Basos: 0 %
EOS (ABSOLUTE): 0.1 10*3/uL (ref 0.0–0.4)
Eos: 1 %
Hematocrit: 40 % (ref 34.0–46.6)
Hemoglobin: 12.4 g/dL (ref 11.1–15.9)
Immature Grans (Abs): 0 10*3/uL (ref 0.0–0.1)
Immature Granulocytes: 0 %
Lymphocytes Absolute: 1.3 10*3/uL (ref 0.7–3.1)
Lymphs: 13 %
MCH: 27.4 pg (ref 26.6–33.0)
MCHC: 31 g/dL — ABNORMAL LOW (ref 31.5–35.7)
MCV: 88 fL (ref 79–97)
Monocytes Absolute: 1 10*3/uL — ABNORMAL HIGH (ref 0.1–0.9)
Monocytes: 10 %
Neutrophils Absolute: 7.5 10*3/uL — ABNORMAL HIGH (ref 1.4–7.0)
Neutrophils: 76 %
Platelets: 378 10*3/uL (ref 150–450)
RBC: 4.53 x10E6/uL (ref 3.77–5.28)
RDW: 15.4 % (ref 11.7–15.4)
WBC: 10 10*3/uL (ref 3.4–10.8)

## 2022-06-07 LAB — CMP14+EGFR
ALT: 13 IU/L (ref 0–32)
AST: 19 IU/L (ref 0–40)
Albumin/Globulin Ratio: 1.4 (ref 1.2–2.2)
Albumin: 3.9 g/dL (ref 3.8–4.8)
Alkaline Phosphatase: 87 IU/L (ref 44–121)
BUN/Creatinine Ratio: 27 (ref 12–28)
BUN: 34 mg/dL — ABNORMAL HIGH (ref 8–27)
Bilirubin Total: <0.2 mg/dL (ref 0.0–1.2)
CO2: 17 mmol/L — ABNORMAL LOW (ref 20–29)
Calcium: 9.2 mg/dL (ref 8.7–10.3)
Chloride: 103 mmol/L (ref 96–106)
Creatinine, Ser: 1.27 mg/dL — ABNORMAL HIGH (ref 0.57–1.00)
Globulin, Total: 2.7 g/dL (ref 1.5–4.5)
Glucose: 186 mg/dL — ABNORMAL HIGH (ref 70–99)
Potassium: 4.9 mmol/L (ref 3.5–5.2)
Sodium: 139 mmol/L (ref 134–144)
Total Protein: 6.6 g/dL (ref 6.0–8.5)
eGFR: 45 mL/min/{1.73_m2} — ABNORMAL LOW (ref >59)

## 2022-06-07 LAB — VITAMIN B12: Vitamin B-12: 428 pg/mL (ref 232–1245)

## 2022-06-07 LAB — VITAMIN D 25 HYDROXY (VIT D DEFICIENCY, FRACTURES): Vit D, 25-Hydroxy: 34.8 ng/mL (ref 30.0–100.0)

## 2022-06-07 LAB — LIPID PANEL WITH LDL/HDL RATIO
Cholesterol, Total: 111 mg/dL (ref 100–199)
HDL: 35 mg/dL — ABNORMAL LOW (ref >39)
LDL Chol Calc (NIH): 55 mg/dL (ref 0–99)
LDL/HDL Ratio: 1.6 ratio (ref 0.0–3.2)
Triglycerides: 114 mg/dL (ref 0–149)
VLDL Cholesterol Cal: 21 mg/dL (ref 5–40)

## 2022-06-07 LAB — INSULIN, RANDOM: INSULIN: 24.3 u[IU]/mL (ref 2.6–24.9)

## 2022-06-07 LAB — HEMOGLOBIN A1C
Est. average glucose Bld gHb Est-mCnc: 174 mg/dL
Hgb A1c MFr Bld: 7.7 % — ABNORMAL HIGH (ref 4.8–5.6)

## 2022-06-07 LAB — TSH: TSH: 0.79 u[IU]/mL (ref 0.450–4.500)

## 2022-06-08 ENCOUNTER — Encounter (HOSPITAL_BASED_OUTPATIENT_CLINIC_OR_DEPARTMENT_OTHER): Payer: Self-pay

## 2022-06-08 ENCOUNTER — Ambulatory Visit (INDEPENDENT_AMBULATORY_CARE_PROVIDER_SITE_OTHER): Payer: 59 | Admitting: Internal Medicine

## 2022-06-08 DIAGNOSIS — R0602 Shortness of breath: Secondary | ICD-10-CM | POA: Diagnosis not present

## 2022-06-08 LAB — PULMONARY FUNCTION TEST
DL/VA % pred: 107 %
DL/VA: 4.55 ml/min/mmHg/L
DLCO cor % pred: 99 %
DLCO cor: 16.6 ml/min/mmHg
DLCO unc % pred: 96 %
DLCO unc: 16.07 ml/min/mmHg
FEF 25-75 Post: 2.38 L/sec
FEF 25-75 Pre: 2.09 L/sec
FEF2575-%Change-Post: 14 %
FEF2575-%Pred-Post: 155 %
FEF2575-%Pred-Pre: 136 %
FEV1-%Change-Post: 2 %
FEV1-%Pred-Post: 102 %
FEV1-%Pred-Pre: 99 %
FEV1-Post: 1.85 L
FEV1-Pre: 1.8 L
FEV1FVC-%Change-Post: 1 %
FEV1FVC-%Pred-Pre: 114 %
FEV6-%Change-Post: 0 %
FEV6-%Pred-Post: 91 %
FEV6-%Pred-Pre: 91 %
FEV6-Post: 2.09 L
FEV6-Pre: 2.1 L
FEV6FVC-%Pred-Post: 105 %
FEV6FVC-%Pred-Pre: 105 %
FVC-%Change-Post: 0 %
FVC-%Pred-Post: 87 %
FVC-%Pred-Pre: 86 %
FVC-Post: 2.12 L
FVC-Pre: 2.1 L
Post FEV1/FVC ratio: 88 %
Post FEV6/FVC ratio: 100 %
Pre FEV1/FVC ratio: 86 %
Pre FEV6/FVC Ratio: 100 %
RV % pred: 78 %
RV: 1.62 L
TLC % pred: 94 %
TLC: 4.22 L

## 2022-06-08 NOTE — Progress Notes (Signed)
Full PFT Performed Today  

## 2022-06-08 NOTE — Patient Instructions (Signed)
Full PFT Performed Today  

## 2022-06-13 ENCOUNTER — Ambulatory Visit (INDEPENDENT_AMBULATORY_CARE_PROVIDER_SITE_OTHER): Payer: 59 | Admitting: Family Medicine

## 2022-06-13 ENCOUNTER — Encounter (INDEPENDENT_AMBULATORY_CARE_PROVIDER_SITE_OTHER): Payer: Self-pay | Admitting: Family Medicine

## 2022-06-13 VITALS — BP 166/71 | HR 65 | Temp 97.9°F | Ht 60.0 in | Wt 256.0 lb

## 2022-06-13 DIAGNOSIS — Z794 Long term (current) use of insulin: Secondary | ICD-10-CM | POA: Diagnosis not present

## 2022-06-13 DIAGNOSIS — Z6841 Body Mass Index (BMI) 40.0 and over, adult: Secondary | ICD-10-CM | POA: Diagnosis not present

## 2022-06-13 DIAGNOSIS — I158 Other secondary hypertension: Secondary | ICD-10-CM | POA: Insufficient documentation

## 2022-06-13 DIAGNOSIS — E782 Mixed hyperlipidemia: Secondary | ICD-10-CM | POA: Diagnosis not present

## 2022-06-13 DIAGNOSIS — I10 Essential (primary) hypertension: Secondary | ICD-10-CM

## 2022-06-13 DIAGNOSIS — N1831 Chronic kidney disease, stage 3a: Secondary | ICD-10-CM | POA: Diagnosis not present

## 2022-06-13 DIAGNOSIS — E1122 Type 2 diabetes mellitus with diabetic chronic kidney disease: Secondary | ICD-10-CM | POA: Diagnosis not present

## 2022-06-13 DIAGNOSIS — I129 Hypertensive chronic kidney disease with stage 1 through stage 4 chronic kidney disease, or unspecified chronic kidney disease: Secondary | ICD-10-CM

## 2022-06-13 DIAGNOSIS — E669 Obesity, unspecified: Secondary | ICD-10-CM | POA: Diagnosis not present

## 2022-06-13 NOTE — Progress Notes (Unsigned)
Chief Complaint:   OBESITY [name] is here to discuss her progress with her obesity treatment plan along with follow-up of her obesity related diagnoses. [name] is on the Category 2 Plan and states she is following her eating plan approximately 85% of the time. [name] states she has been walking some.   Today's visit was #: 2 Starting weight: 258 lbs Starting date: 05/30/2022 Today's weight: 256 lbs Today's date: 06/13/2022 Total lbs lost to date: 2 Total lbs lost since last in-office visit: 2  Interim History: [name] has done well with her weight loss.  She struggles with meal planning and prepping for dinner.  Subjective:   1. Type 2 diabetes mellitus with stage 3a chronic kidney disease, with long-term current use of insulin [name] is A1c is worsening, and her recent A1c was elevated at 7.7.  She is at high risk of diabetes mellitus complications.  She is on glargine 72 units BID.  She has had some hypoglycemia episodes in the 70s lately, and she will be decreasing her glargine to 70 units twice daily this Wednesday.  I discussed labs with the patient today.  2. White coat syndrome with diagnosis of hypertension [name]'s blood pressure is elevated even after repeat reading.  She states the upper arm readings are always elevated and her blood pressure at home and at other physician offices are better.  3. Mixed hyperlipidemia [name] is on Lipitor 40 mg with levels at goal, except low HDL.  I discussed labs with the patient today.  Assessment/Plan:   1. Type 2 diabetes mellitus with stage 3a chronic kidney disease, with long-term current use of insulin [name] is to contact her endocrinologist to see if she can change to a GLP-1 with better weight loss efforts.  She will get some glucose tablets to keep by her bed if her glucose decreases.  2. White coat syndrome with diagnosis of hypertension [name] will continue her metoprolol, losartan, and amlodipine.  3. Mixed  hyperlipidemia [name] will continue her Lipitor at 40 mg, and she will continue with her eating plan.  4. BMI 50.0-59.9, adult  5. Obesity, Beginning BMI 50.39 [name] is currently in the action stage of change. As such, her goal is to continue with weight loss efforts. She has agreed to the Category 2 Plan and keeping a food journal and adhering to recommended goals of 350-500 calories and 35+ grams of protein at supper daily.   Exercise goals: As is.   Behavioral modification strategies: increasing lean protein intake.  [name] has agreed to follow-up with our clinic in 2 weeks. She was informed of the importance of frequent follow-up visits to maximize her success with intensive lifestyle modifications for her multiple health conditions.   Objective:   Blood pressure (!) 166/71, pulse 65, temperature 97.9 F (36.6 C), height 5' (1.524 m), weight 265 lb (120.2 kg), SpO2 90 %. Body mass index is 51.75 kg/m.  Lab Results  Component Value Date   CREATININE 1.27 (H) 05/30/2022   BUN 34 (H) 05/30/2022   NA 139 05/30/2022   K 4.9 05/30/2022   CL 103 05/30/2022   CO2 17 (L) 05/30/2022   Lab Results  Component Value Date   ALT 13 05/30/2022   AST 19 05/30/2022   ALKPHOS 87 05/30/2022   BILITOT <0.2 05/30/2022   Lab Results  Component Value Date   HGBA1C 7.7 (H) 05/30/2022   HGBA1C 7.4 (H) 11/28/2017   HGBA1C 6.8 (H) 07/18/2017   HGBA1C 7.7 (H)  07/20/2016   HGBA1C 7.9 09/18/2014   Lab Results  Component Value Date   INSULIN 24.3 05/30/2022   Lab Results  Component Value Date   TSH 0.790 05/30/2022   Lab Results  Component Value Date   CHOL 111 05/30/2022   HDL 35 (L) 05/30/2022   LDLCALC 55 05/30/2022   TRIG 114 05/30/2022   Lab Results  Component Value Date   VD25OH 34.8 05/30/2022   VD25OH 31.4 11/28/2017   VD25OH 30.5 07/18/2017   Lab Results  Component Value Date   WBC 10.0 05/30/2022   HGB 12.4 05/30/2022   HCT 40.0 05/30/2022   MCV 88 05/30/2022    PLT 378 05/30/2022   No results found for: "IRON", "TIBC", "FERRITIN"  Attestation Statements:   Reviewed by clinician on day of visit: allergies, medications, problem list, medical history, surgical history, family history, social history, and previous encounter notes.  Time spent on visit including pre-visit chart review and post-visit care and charting was 40 minutes.   I, Trixie Dredge, am acting as transcriptionist for Dennard Nip, MD.  I have reviewed the above documentation for accuracy and completeness, and I agree with the above. -  Dennard Nip, MD

## 2022-06-14 ENCOUNTER — Telehealth (INDEPENDENT_AMBULATORY_CARE_PROVIDER_SITE_OTHER): Payer: Self-pay | Admitting: Psychology

## 2022-06-14 ENCOUNTER — Telehealth (INDEPENDENT_AMBULATORY_CARE_PROVIDER_SITE_OTHER): Payer: Medicare Other | Admitting: Psychology

## 2022-06-14 ENCOUNTER — Encounter (INDEPENDENT_AMBULATORY_CARE_PROVIDER_SITE_OTHER): Payer: Self-pay
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
## 2022-06-14 NOTE — Telephone Encounter (Signed)
  Office: [phone]  /  Fax: [phone]  Date of Call: June 14, 2022  Time of Call: 3:03pm Provider: Glennie Isle, PsyD  CONTENT: This provider called [name] to check-in as she did not present for today's MyChart Video Visit appointment at 3pm. She indicated she was attempting to join the appointment and also had the help desk on the other line. Assistance was provided. This provider called [name] again at 3:10pm as she did not present and further assistance was provided. This provided called her once again at 3:16pm. [name] was unable to connect despite the various troubleshooting efforts. She was receptive to rescheduling her appointment and agreed to come to the clinic for her next appointment so that in-person assistance could be provided by staff to ensure connection. [name] also shared she is having surgery on 06/20/2022. Additionally, she shared while she is feeling overwhelmed by multiple medical appointments, she is "feeling good" about her eating habits after her appointment with [name] yesterday. A brief risk assessment was completed. [name] denied experiencing suicidal, self-injurious, and homicidal ideation, plan, or intent since the last appointment with this provider. All questions/concerns addressed.   PLAN: Surveyor, quantity will call [name] to reschedule today's appointment with this provider and also schedule a follow-up with [name]. The no show fee will be waived for today's appointment.

## 2022-06-15 ENCOUNTER — Other Ambulatory Visit (HOSPITAL_COMMUNITY): Payer: Self-pay

## 2022-06-15 ENCOUNTER — Other Ambulatory Visit: Payer: Self-pay | Admitting: Cardiovascular Disease

## 2022-06-15 MED ORDER — METOPROLOL TARTRATE 50 MG PO TABS
50.0000 mg | ORAL_TABLET | Freq: Two times a day (BID) | ORAL | 3 refills | Status: DC
  Filled 2022-06-15: qty 180, 90d supply, fill #0
  Filled 2022-09-20: qty 180, 90d supply, fill #1
  Filled 2022-12-19: qty 180, 90d supply, fill #2
  Filled 2023-03-09: qty 180, 90d supply, fill #3

## 2022-06-16 ENCOUNTER — Other Ambulatory Visit: Payer: Self-pay

## 2022-06-16 ENCOUNTER — Other Ambulatory Visit (HOSPITAL_COMMUNITY): Payer: Self-pay

## 2022-06-16 ENCOUNTER — Encounter (HOSPITAL_COMMUNITY): Payer: Self-pay | Admitting: Orthopedic Surgery

## 2022-06-16 NOTE — Progress Notes (Signed)
Spoke with pt for pre-op call. Pt has hx of A-fib. Pt's cardiologist is Dr. Johnsie Cancel. Pt is on Xarelto and she states Dr. Johnsie Cancel instructed her to hold 3 days prior to surgery. Last dose is today 06/16/22.   Pt is type 2 diabetic. Pt is on Trulicity which she takes on Sundays. Instructed her to hold the Trulicity on Sunday [id number]. Instructed her to hold the Xigduo (diabetic medication) as of today. Last dose is today, 06/16/22. Pt voiced understanding. Instructed her to take 1/2 of her regular dose of Semglee Insulin Sunday PM and Monday AM. She will take 37 units each time. Instructed her to check her blood sugar when when wakes up Monday AM. If blood sugar is 70 or below, treat with 1/2 cup of clear juice (apple or cranberry) and recheck blood sugar 15 minutes after drinking juice. If blood sugar continues to be 70 or below, call the Short Stay department and ask to speak to a nurse.  Shower instructions given to pt.

## 2022-06-17 ENCOUNTER — Other Ambulatory Visit (HOSPITAL_COMMUNITY): Payer: Self-pay

## 2022-06-19 NOTE — Anesthesia Preprocedure Evaluation (Signed)
Anesthesia Evaluation  Patient identified by MRN, date of birth, ID band Patient awake    Reviewed: Allergy & Precautions, NPO status , Patient's Chart, lab work & pertinent test results, reviewed documented beta blocker date and time   History of Anesthesia Complications (+) PONV and history of anesthetic complications  Airway Mallampati: III  TM Distance: >3 FB Neck ROM: Limited    Dental  (+) Dental Advisory Given, Teeth Intact, Chipped   Pulmonary shortness of breath and with exertion, sleep apnea and Continuous Positive Airway Pressure Ventilation , former smoker   Pulmonary exam normal breath sounds clear to auscultation       Cardiovascular hypertension, Pt. on medications and Pt. on home beta blockers + dysrhythmias Atrial Fibrillation  Rhythm:Regular Rate:Normal  Echo 07/2021  1. Left ventricular ejection fraction, by estimation, is 55 to 60%. The  left ventricle has normal function. The left ventricle has no regional  wall motion abnormalities. There is mild left ventricular hypertrophy.  Left ventricular diastolic parameters  are consistent with Grade I diastolic dysfunction (impaired relaxation).   2. Right ventricular systolic function is normal. The right ventricular  size is normal. There is normal pulmonary artery systolic pressure. The  estimated right ventricular systolic pressure is 29.2 mmHg.   3. Left atrial size was mildly dilated.   4. Right atrial size was mildly dilated.   5. The mitral valve is normal in structure. Trivial mitral valve  regurgitation. No evidence of mitral stenosis. Moderate mitral annular  calcification.   6. The aortic valve is tricuspid. There is mild calcification of the  aortic valve. Aortic valve regurgitation is not visualized. No aortic  stenosis is present.   7. The inferior vena cava is normal in size with greater than 50%  respiratory variability, suggesting right atrial  pressure of 3 mmHg.    Stress MPS 07/2021   Findings are consistent with prior myocardial infarction. The study is low risk.   No ST deviation was noted.   LV perfusion is abnormal. There is no evidence of ischemia. There is evidence of infarction. Defect 1: There is a small defect with mild reduction in uptake present in the apical to mid inferolateral location(s) that is fixed. There is abnormal wall motion in the defect area. Consistent with infarction.   Left ventricular function is normal. Nuclear stress EF: 57 %. The left ventricular ejection fraction is normal (55-65%). End diastolic cavity size is mildly enlarged. End systolic cavity size is mildly enlarged.   Prior study available for comparison from 07/21/2016.   1. Fixed perfusion defect in mid to apical inferolateral wall with wall motion abnormality, consistent with infarct 2. Normal EF (57%) 3. Low risk study.  No ischemia     HLD  TTE 2019 EF 60-65%, G1DD, no significant valvular abnormalities  Stress Test 2018 Nuclear stress EF: 64%. No wall motion abnormalities. There was no ST segment deviation noted during stress. This is a low risk study. No ischemia identified. Perfusion is suggestive of left ventricular hypertrophy.   Neuro/Psych  PSYCHIATRIC DISORDERS  Depression     Neuromuscular disease    GI/Hepatic Neg liver ROS,GERD  ,,  Endo/Other  diabetes, Type 2, Insulin Dependent, Oral Hypoglycemic AgentsHypothyroidism  Morbid obesity (BMI 52)  Renal/GU Renal InsufficiencyRenal disease (Cr 1.23, K 4.3)     Musculoskeletal  (+) Arthritis ,    Abdominal  (+) + obese  Peds  Hematology  (+) Blood dyscrasia (on xarelto, last dose 04/28/19), anemia   Anesthesia  Other Findings   Reproductive/Obstetrics                             Anesthesia Physical Anesthesia Plan  ASA: 4  Anesthesia Plan: General   Post-op Pain Management:  Regional for Post-op pain and Tylenol PO  (pre-op)*   Induction: Intravenous  PONV Risk Score and Plan: 3 and Ondansetron, Treatment may vary due to age or medical condition, Dexamethasone and Midazolam  Airway Management Planned: Oral ETT  Additional Equipment:   Intra-op Plan:   Post-operative Plan: Extubation in OR  Informed Consent: I have reviewed the patients History and Physical, chart, labs and discussed the procedure including the risks, benefits and alternatives for the proposed anesthesia with the patient or authorized representative who has indicated his/her understanding and acceptance.     Dental advisory given  Plan Discussed with: CRNA  Anesthesia Plan Comments:          Anesthesia Quick Evaluation

## 2022-06-20 ENCOUNTER — Encounter (HOSPITAL_COMMUNITY)
Admission: RE | Disposition: A | Discharge status: Home / Self Care | Payer: Self-pay | Source: Home / Self Care | Attending: Orthopedic Surgery

## 2022-06-20 ENCOUNTER — Other Ambulatory Visit (HOSPITAL_COMMUNITY): Payer: Self-pay

## 2022-06-20 ENCOUNTER — Ambulatory Visit (HOSPITAL_COMMUNITY)
Admission: RE | Admit: 2022-06-20 | Discharge: 2022-06-20 | Disposition: A | Discharge status: Home / Self Care | Payer: 59 | Attending: Orthopedic Surgery | Admitting: Orthopedic Surgery

## 2022-06-20 ENCOUNTER — Other Ambulatory Visit: Payer: Self-pay

## 2022-06-20 ENCOUNTER — Ambulatory Visit (HOSPITAL_BASED_OUTPATIENT_CLINIC_OR_DEPARTMENT_OTHER): Payer: 59 | Admitting: Anesthesiology

## 2022-06-20 ENCOUNTER — Encounter (HOSPITAL_COMMUNITY): Payer: Self-pay | Admitting: Orthopedic Surgery

## 2022-06-20 ENCOUNTER — Ambulatory Visit (HOSPITAL_COMMUNITY): Payer: 59 | Admitting: Anesthesiology

## 2022-06-20 DIAGNOSIS — Z87891 Personal history of nicotine dependence: Secondary | ICD-10-CM | POA: Insufficient documentation

## 2022-06-20 DIAGNOSIS — Z09 Encounter for follow-up examination after completed treatment for conditions other than malignant neoplasm: Secondary | ICD-10-CM | POA: Insufficient documentation

## 2022-06-20 DIAGNOSIS — N189 Chronic kidney disease, unspecified: Secondary | ICD-10-CM | POA: Insufficient documentation

## 2022-06-20 DIAGNOSIS — I10 Essential (primary) hypertension: Secondary | ICD-10-CM

## 2022-06-20 DIAGNOSIS — Z794 Long term (current) use of insulin: Secondary | ICD-10-CM | POA: Insufficient documentation

## 2022-06-20 DIAGNOSIS — E1122 Type 2 diabetes mellitus with diabetic chronic kidney disease: Secondary | ICD-10-CM | POA: Insufficient documentation

## 2022-06-20 DIAGNOSIS — M25442 Effusion, left hand: Secondary | ICD-10-CM | POA: Diagnosis not present

## 2022-06-20 DIAGNOSIS — G4733 Obstructive sleep apnea (adult) (pediatric): Secondary | ICD-10-CM | POA: Insufficient documentation

## 2022-06-20 DIAGNOSIS — M25542 Pain in joints of left hand: Secondary | ICD-10-CM

## 2022-06-20 DIAGNOSIS — E11319 Type 2 diabetes mellitus with unspecified diabetic retinopathy without macular edema: Secondary | ICD-10-CM | POA: Insufficient documentation

## 2022-06-20 DIAGNOSIS — Z6841 Body Mass Index (BMI) 40.0 and over, adult: Secondary | ICD-10-CM | POA: Diagnosis not present

## 2022-06-20 DIAGNOSIS — I4891 Unspecified atrial fibrillation: Secondary | ICD-10-CM | POA: Insufficient documentation

## 2022-06-20 DIAGNOSIS — Z7985 Long-term (current) use of injectable non-insulin antidiabetic drugs: Secondary | ICD-10-CM | POA: Insufficient documentation

## 2022-06-20 DIAGNOSIS — E039 Hypothyroidism, unspecified: Secondary | ICD-10-CM | POA: Insufficient documentation

## 2022-06-20 DIAGNOSIS — M1990 Unspecified osteoarthritis, unspecified site: Secondary | ICD-10-CM | POA: Insufficient documentation

## 2022-06-20 DIAGNOSIS — Z7901 Long term (current) use of anticoagulants: Secondary | ICD-10-CM | POA: Diagnosis not present

## 2022-06-20 DIAGNOSIS — I129 Hypertensive chronic kidney disease with stage 1 through stage 4 chronic kidney disease, or unspecified chronic kidney disease: Secondary | ICD-10-CM | POA: Insufficient documentation

## 2022-06-20 DIAGNOSIS — K219 Gastro-esophageal reflux disease without esophagitis: Secondary | ICD-10-CM | POA: Insufficient documentation

## 2022-06-20 DIAGNOSIS — E7800 Pure hypercholesterolemia, unspecified: Secondary | ICD-10-CM | POA: Diagnosis not present

## 2022-06-20 DIAGNOSIS — Z9989 Dependence on other enabling machines and devices: Secondary | ICD-10-CM | POA: Diagnosis not present

## 2022-06-20 DIAGNOSIS — Z7984 Long term (current) use of oral hypoglycemic drugs: Secondary | ICD-10-CM | POA: Diagnosis not present

## 2022-06-20 DIAGNOSIS — F32A Depression, unspecified: Secondary | ICD-10-CM | POA: Insufficient documentation

## 2022-06-20 DIAGNOSIS — M79645 Pain in left finger(s): Secondary | ICD-10-CM | POA: Diagnosis not present

## 2022-06-20 HISTORY — PX: SYNOVECTOMY: SHX5180

## 2022-06-20 LAB — GLUCOSE, CAPILLARY
Glucose-Capillary: 182 mg/dL — ABNORMAL HIGH (ref 70–99)
Glucose-Capillary: 151 mg/dL — ABNORMAL HIGH (ref 70–99)

## 2022-06-20 SURGERY — SYNOVECTOMY
Anesthesia: General | Site: Finger | Laterality: Left

## 2022-06-20 MED ORDER — ROCURONIUM BROMIDE 10 MG/ML (PF) SYRINGE
PREFILLED_SYRINGE | INTRAVENOUS | Status: DC | PRN
  Administered 2022-06-20: 60 mg via INTRAVENOUS

## 2022-06-20 MED ORDER — SUGAMMADEX SODIUM 200 MG/2ML IV SOLN
INTRAVENOUS | Status: DC | PRN
  Administered 2022-06-20: 200 mg via INTRAVENOUS
  Administered 2022-06-20: 100 mg via INTRAVENOUS

## 2022-06-20 MED ORDER — ACETAMINOPHEN 500 MG PO TABS
1000.0000 mg | ORAL_TABLET | Freq: Once | ORAL | Status: AC
  Administered 2022-06-20: 1000 mg via ORAL
  Filled 2022-06-20: qty 2

## 2022-06-20 MED ORDER — INSULIN ASPART 100 UNIT/ML IJ SOLN
0.0000 [IU] | INTRAMUSCULAR | Status: DC | PRN
  Administered 2022-06-20: 2 [IU] via SUBCUTANEOUS
  Filled 2022-06-20: qty 1

## 2022-06-20 MED ORDER — FENTANYL CITRATE (PF) 250 MCG/5ML IJ SOLN
INTRAMUSCULAR | Status: DC | PRN
  Administered 2022-06-20: 100 ug via INTRAVENOUS

## 2022-06-20 MED ORDER — DEXAMETHASONE SODIUM PHOSPHATE 10 MG/ML IJ SOLN
INTRAMUSCULAR | Status: DC | PRN
  Administered 2022-06-20: 5 mg via INTRAVENOUS

## 2022-06-20 MED ORDER — LACTATED RINGERS IV SOLN: INTRAVENOUS | Status: DC

## 2022-06-20 MED ORDER — BUPIVACAINE HCL (PF) 0.5 % IJ SOLN
INTRAMUSCULAR | Status: DC | PRN
  Administered 2022-06-20: 9 mL

## 2022-06-20 MED ORDER — HYDROCODONE-ACETAMINOPHEN 5-325 MG PO TABS
1.0000 | ORAL_TABLET | Freq: Four times a day (QID) | ORAL | 0 refills | Status: DC | PRN
  Filled 2022-06-20: qty 30, 8d supply, fill #0

## 2022-06-20 MED ORDER — PHENYLEPHRINE HCL-NACL 20-0.9 MG/250ML-% IV SOLN
INTRAVENOUS | Status: DC | PRN
  Administered 2022-06-20: 60 ug/min via INTRAVENOUS

## 2022-06-20 MED ORDER — LIDOCAINE 2% (20 MG/ML) 5 ML SYRINGE
INTRAMUSCULAR | Status: DC | PRN
  Administered 2022-06-20: 100 mg via INTRAVENOUS

## 2022-06-20 MED ORDER — EPHEDRINE SULFATE-NACL 50-0.9 MG/10ML-% IV SOSY
PREFILLED_SYRINGE | INTRAVENOUS | Status: DC | PRN
  Administered 2022-06-20: 5 mg via INTRAVENOUS
  Administered 2022-06-20: 10 mg via INTRAVENOUS

## 2022-06-20 MED ORDER — ONDANSETRON HCL 4 MG/2ML IJ SOLN
INTRAMUSCULAR | Status: DC | PRN
  Administered 2022-06-20: 4 mg via INTRAVENOUS

## 2022-06-20 MED ORDER — ROCURONIUM BROMIDE 10 MG/ML (PF) SYRINGE
PREFILLED_SYRINGE | INTRAVENOUS | Status: AC
  Filled 2022-06-20: qty 10

## 2022-06-20 MED ORDER — DEXAMETHASONE SODIUM PHOSPHATE 10 MG/ML IJ SOLN
INTRAMUSCULAR | Status: AC
  Filled 2022-06-20: qty 1

## 2022-06-20 MED ORDER — CHLORHEXIDINE GLUCONATE 0.12 % MT SOLN
OROMUCOSAL | Status: AC
  Administered 2022-06-20: 15 mL via OROMUCOSAL
  Filled 2022-06-20: qty 15

## 2022-06-20 MED ORDER — CEFAZOLIN SODIUM-DEXTROSE 2-4 GM/100ML-% IV SOLN
2.0000 g | INTRAVENOUS | Status: AC
  Administered 2022-06-20: 2 g via INTRAVENOUS
  Filled 2022-06-20: qty 100

## 2022-06-20 MED ORDER — 0.9 % SODIUM CHLORIDE (POUR BTL) OPTIME
TOPICAL | Status: DC | PRN
  Administered 2022-06-20: 1000 mL

## 2022-06-20 MED ORDER — PROPOFOL 10 MG/ML IV BOLUS
INTRAVENOUS | Status: DC | PRN
  Administered 2022-06-20: 150 mg via INTRAVENOUS

## 2022-06-20 MED ORDER — ONDANSETRON HCL 4 MG/2ML IJ SOLN
INTRAMUSCULAR | Status: AC
  Filled 2022-06-20: qty 2

## 2022-06-20 MED ORDER — CHLORHEXIDINE GLUCONATE 0.12 % MT SOLN: 15.0000 mL | OROMUCOSAL | Status: AC

## 2022-06-20 MED ORDER — PROPOFOL 10 MG/ML IV BOLUS
INTRAVENOUS | Status: AC
  Filled 2022-06-20: qty 20

## 2022-06-20 MED ORDER — POVIDONE-IODINE 10 % EX SWAB: 2.0000 | Freq: Once | CUTANEOUS | Status: DC

## 2022-06-20 MED ORDER — LIDOCAINE 2% (20 MG/ML) 5 ML SYRINGE
INTRAMUSCULAR | Status: AC
  Filled 2022-06-20: qty 5

## 2022-06-20 MED ORDER — FENTANYL CITRATE (PF) 250 MCG/5ML IJ SOLN
INTRAMUSCULAR | Status: AC
  Filled 2022-06-20: qty 5

## 2022-06-20 MED ORDER — BUPIVACAINE HCL (PF) 0.5 % IJ SOLN
INTRAMUSCULAR | Status: AC
  Filled 2022-06-20: qty 30

## 2022-06-20 MED ORDER — PHENYLEPHRINE 80 MCG/ML (10ML) SYRINGE FOR IV PUSH (FOR BLOOD PRESSURE SUPPORT)
PREFILLED_SYRINGE | INTRAVENOUS | Status: DC | PRN
  Administered 2022-06-20 (×2): 160 ug via INTRAVENOUS

## 2022-06-20 SURGICAL SUPPLY — 55 items
BAG COUNTER SPONGE SURGICOUNT (BAG) ×1 IMPLANT
BAG SPNG CNTER NS LX DISP (BAG) ×1
BNDG CMPR 5X4 KNIT ELC UNQ LF (GAUZE/BANDAGES/DRESSINGS) ×1
BNDG CMPR 9X4 STRL LF SNTH (GAUZE/BANDAGES/DRESSINGS) ×1
BNDG ELASTIC 4INX 5YD STR LF (GAUZE/BANDAGES/DRESSINGS) IMPLANT
BNDG ELASTIC 4X5.8 VLCR STR LF (GAUZE/BANDAGES/DRESSINGS) IMPLANT
BNDG ESMARK 4X9 LF (GAUZE/BANDAGES/DRESSINGS) ×1 IMPLANT
BNDG GAUZE DERMACEA FLUFF 4 (GAUZE/BANDAGES/DRESSINGS) IMPLANT
BNDG GZE DERMACEA 4 6PLY (GAUZE/BANDAGES/DRESSINGS) ×1
CORD BIPOLAR FORCEPS 12FT (ELECTRODE) ×1 IMPLANT
COVER SURGICAL LIGHT HANDLE (MISCELLANEOUS) ×1 IMPLANT
CUFF TOURN SGL QUICK 18X4 (TOURNIQUET CUFF) IMPLANT
CUFF TOURN SGL QUICK 24 (TOURNIQUET CUFF)
CUFF TRNQT CYL 24X4X16.5-23 (TOURNIQUET CUFF) IMPLANT
DRAPE OEC MINIVIEW 54X84 (DRAPES) IMPLANT
DRAPE SURG 17X23 STRL (DRAPES) ×1 IMPLANT
DURAPREP 26ML APPLICATOR (WOUND CARE) ×1 IMPLANT
GAUZE SPONGE 4X4 12PLY STRL (GAUZE/BANDAGES/DRESSINGS) IMPLANT
GAUZE XEROFORM 1X8 LF (GAUZE/BANDAGES/DRESSINGS) IMPLANT
GLOVE SURG SYN 8.0 (GLOVE) ×1 IMPLANT
GLOVE SURG SYN 8.0 PF PI (GLOVE) ×1 IMPLANT
GOWN STRL REUS W/ TWL LRG LVL3 (GOWN DISPOSABLE) ×1 IMPLANT
GOWN STRL REUS W/ TWL XL LVL3 (GOWN DISPOSABLE) ×1 IMPLANT
GOWN STRL REUS W/TWL LRG LVL3 (GOWN DISPOSABLE) ×1
GOWN STRL REUS W/TWL XL LVL3 (GOWN DISPOSABLE) ×1
KIT BASIN OR (CUSTOM PROCEDURE TRAY) ×1 IMPLANT
KIT TURNOVER KIT B (KITS) ×1 IMPLANT
MANIFOLD NEPTUNE II (INSTRUMENTS) ×1 IMPLANT
NDL HYPO 25GX1X1/2 BEV (NEEDLE) IMPLANT
NEEDLE HYPO 25GX1X1/2 BEV (NEEDLE) IMPLANT
NS IRRIG 1000ML POUR BTL (IV SOLUTION) ×1 IMPLANT
PACK ORTHO EXTREMITY (CUSTOM PROCEDURE TRAY) ×1 IMPLANT
PAD ARMBOARD 7.5X6 YLW CONV (MISCELLANEOUS) ×2 IMPLANT
PAD CAST 4YDX4 CTTN HI CHSV (CAST SUPPLIES) IMPLANT
PADDING CAST COTTON 4X4 STRL (CAST SUPPLIES) ×1
SPIKE FLUID TRANSFER (MISCELLANEOUS) IMPLANT
SPLINT PLASTER CAST XFAST 3X15 (CAST SUPPLIES) IMPLANT
STOCKINETTE 6  STRL (DRAPES) ×1
STOCKINETTE 6 STRL (DRAPES) IMPLANT
STRIP CLOSURE SKIN 1/2X4 (GAUZE/BANDAGES/DRESSINGS) IMPLANT
SUT ETHIBOND 3-0 V-5 (SUTURE) IMPLANT
SUT ETHILON 4 0 PS 2 18 (SUTURE) IMPLANT
SUT ETHILON 5 0 PS 2 18 (SUTURE) IMPLANT
SUT MERSILENE 4 0 P 3 (SUTURE) IMPLANT
SUT PROLENE 3 0 PS 2 (SUTURE) IMPLANT
SUT SILK 4 0 PS 2 (SUTURE) IMPLANT
SUT VIC AB 3-0 FS2 27 (SUTURE) IMPLANT
SUT VIC AB 4-0 P-3 18X BRD (SUTURE) IMPLANT
SUT VIC AB 4-0 P3 18 (SUTURE)
SYR CONTROL 10ML LL (SYRINGE) IMPLANT
TOWEL GREEN STERILE (TOWEL DISPOSABLE) ×1 IMPLANT
TOWEL GREEN STERILE FF (TOWEL DISPOSABLE) ×1 IMPLANT
TUBE CONNECTING 12X1/4 (SUCTIONS) IMPLANT
UNDERPAD 30X36 HEAVY ABSORB (UNDERPADS AND DIAPERS) ×1 IMPLANT
WATER STERILE IRR 1000ML POUR (IV SOLUTION) ×1 IMPLANT

## 2022-06-20 NOTE — Anesthesia Postprocedure Evaluation (Signed)
Anesthesia Post Note  Patient: [name]  Procedure(s) Performed: left index finger and left long finger metacarpal phalangel joint syonvectomy (Left: Finger)     Patient location during evaluation: PACU Anesthesia Type: General Level of consciousness: sedated and patient cooperative Pain management: pain level controlled Vital Signs Assessment: post-procedure vital signs reviewed and stable Respiratory status: spontaneous breathing Cardiovascular status: stable Anesthetic complications: no   There were no known notable events for this encounter.  Last Vitals:  Vitals:   06/20/22 0900 06/20/22 0915  BP: 128/60 131/62  Pulse: (!) 58 (!) 56  Resp: 19 19  Temp:  36.8 C  SpO2: 91% 92%    Last Pain:  Vitals:   06/20/22 0915  TempSrc:   PainSc: 0-No pain                 Lewie Loron

## 2022-06-20 NOTE — Op Note (Signed)
Patient was taken to the operating suite and after induction of adequate general anesthetic left upper extremity was prepped and draped in the usual sterile fashion.  A forearm tourniquet was inflated to 225 mmHg after exsanguination with an Esmarch.  At this point in time a transverse incision was made over the index long metacarpal phalangeal joints and dissection was carried down to the extensor mechanism.  Both the index and long finger had subtle instability with full flexion with subluxation ulnarly.  We carefully released the ulnar side and then retracted the tendons to the midline on both.  Once this was done we made dorsal radial arthrotomies and perform synovectomies of the index long finger as well as removing a possible lipoma between the index and long metacarpals.  This was all sent for pathologic confirmation.  The wound was irrigated.  We then realign the extensor tendons by oversewing the radial side of the sagittal bands to centralize the tendons.  We then irrigated and loosely closed with 4-0 nylon.  Xeroform, 4 x 4's, and a palmar splint with the MCP joint slightly flexed and the PIP joints distal free.  Patient tolerated this procedure well went recovery in stable fashion.

## 2022-06-20 NOTE — Anesthesia Procedure Notes (Signed)
Procedure Name: Intubation Date/Time: 06/20/2022 8:05 AM  Performed by: Randon Goldsmith, CRNAPre-anesthesia Checklist: Patient identified, Emergency Drugs available, Suction available and Patient being monitored Patient Re-evaluated:Patient Re-evaluated prior to induction Oxygen Delivery Method: Circle system utilized Preoxygenation: Pre-oxygenation with 100% oxygen Induction Type: IV induction Ventilation: Mask ventilation without difficulty Laryngoscope Size: Miller and 2 Grade View: Grade I Tube type: Oral Tube size: 7.0 mm Number of attempts: 1 Airway Equipment and Method: Stylet and Oral airway Placement Confirmation: ETT inserted through vocal cords under direct vision, positive ETCO2 and breath sounds checked- equal and bilateral Secured at: 21 cm Tube secured with: Tape Dental Injury: Teeth and Oropharynx as per pre-operative assessment  Comments: Intubated by [facility]

## 2022-06-20 NOTE — H&P (Signed)
[name] is an 72 y.o. female.   Chief Complaint: Chronic and progressive left index and long metacarpal phalangeal joint swelling HPI: 72 year old female with chronic and progressive left index and left long metacarpal phalangeal joint swelling.  Patient has failed conservative treatment and is having persistent swelling and discomfort.  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   Obesity    OSA on CPAP    uses CPAP nightly   Osteoarthritis    Osteoarthritis    Palpitations    Pinched nerve in neck    Pleurisy    Pneumonia    hx of   PONV (postoperative nausea and vomiting)    for 3-4 days after general anesthesia approx 10-12 years ago   Sciatica    Shortness of breath    Sleep apnea    Spinal stenosis    Swelling of both lower extremities    Swelling of knee joint, right    Synovitis of knee RIGHT   Trigger finger, left    left index   Vitamin D deficiency     Past Surgical History:  Procedure Laterality Date   CESAREAN SECTION  X3   KNEE ARTHROSCOPY  04/20/2011   Procedure: ARTHROSCOPY KNEE;  Surgeon: Loanne Drilling, MD;  Location: [facility] [place];  Service: Orthopedics;  Laterality: Right;  WITH SYNOVECTOMY   LEFT CARPAL TUNNEL / LEFT MIDDLE & RING FINGER TRIGGER RELEASE  08-26-2008   LEFT SHOULDER ARTHROSCOPY W/ DEBRIDEMENT  09-09-2003   LEFT SHOULDER  ARTHROSCOPY/ LEFT THUMB TRIGGER RELEASE  02-22-2005   PHOTOCOAGULATION WITH LASER Right 03/20/2018   Procedure: PHOTOCOAGULATION WITH LASER;  Surgeon: Carmela Rima, MD;  Location: [facility] OR;  Service: Ophthalmology;  Laterality: Right;   PULLEY RELEASE LEFT LONG FINGER  07-14-2009   RADIAL HEAD ARTHROPLASTY Right 06/17/2018   Procedure: RADIAL HEAD ARTHROPLASTY;  Surgeon: Bjorn Pippin, MD;  Location: [facility] OR;  Service: Orthopedics;  Laterality: Right;   RIGHT CARPAL TUNNEL/ RIGHT THUMB TRIGGER RELEASE'S  11-28-2006   RIGHT SHOULDER ARTHROSCOPY W/ ROTATOR CUFF REPAIR  01-13-2004   SHOULDER ARTHROSCOPY DISTAL CLAVICLE EXCISION AND OPEN ROTATOR CUFF REPAIR  09-07-2004   LEFT   SHOULDER ARTHROSCOPY W/ ACROMIAL REPAIR  11-29-2005   LEFT   TONSILLECTOMY AND ADENOIDECTOMY  child   TOTAL KNEE ARTHROPLASTY  12-14-2009   RIGHT   TOTAL KNEE ARTHROPLASTY Left 11/05/2012   Procedure: LEFT TOTAL KNEE ARTHROPLASTY;  Surgeon: Loanne Drilling, MD;  Location: [facility];  Service: Orthopedics;  Laterality: Left;   TOTAL KNEE REVISION Right 07/29/2016   Procedure: RIGHT KNEE POLY-LINER EXCHANGE;  Surgeon: Kathryne Hitch, MD;  Location:  [facility];  Service: Orthopedics;  Laterality: Right;   TRIGGER FINGER RELEASE Right 02/21/2013   Procedure: RIGHT RING A-1 PULLEY RELEASE    (MINOR PROCEDURE) ;  Surgeon: Wyn Forsterobert V Sypher Jr., MD;  [facility] [place];  Service: Orthopedics;  Laterality: Right;   TRIGGER FINGER RELEASE Left 09/22/2016   Procedure: RELEASE TRIGGER FINGER LEFT INDEX FINGER;  Surgeon: Kathryne HitchBlackman, Christopher Y, MD;  Location: [facility] OR;  Service: Orthopedics;  Laterality: Left;   TRIGGER FINGER RELEASE Right 01/20/2017   Procedure: RELEASE TRIGGER FINGER/A-1 PULLEY RIGHT INDEX FINGER;  Surgeon: Marcene Corningalldorf, Peter, MD;  Location: [facility];  Service: Orthopedics;  Laterality: Right;   VITRECTOMY 25 GAUGE WITH SCLERAL BUCKLE Right 03/20/2018   Procedure: RIGHT EYE VITRECTOMY WITH   ENDOLASER PARENTAL PHOTOCOAGULATION 25 GAUGE;  Surgeon: Carmela RimaPatel, Narendra, MD;  [facility];  Service: Ophthalmology;  Laterality: Right;   WRIST ARTHROSCOPY WITH DEBRIDEMENT Right 05/01/2019   Procedure: WRIST ARTHROSCOPY WITH DEBRIDEMENT;  Surgeon: Dairl PonderWeingold, Nayelly Laughman, MD;  Location: [facility];  Service: Orthopedics;  Laterality: Right;    Family History  Problem Relation Age of Onset   Diabetes Mother    Hypertension Mother    Heart attack Mother    Thyroid disease Mother    Obesity Mother    Cancer Father    Liver disease Father    Sleep apnea Father    Alcoholism Father    Social History:  reports that she quit smoking about 50 years ago. Her smoking use included cigarettes. She has never used smokeless tobacco. She reports that she does not drink alcohol and does not use drugs.  Allergies:  Allergies  Allergen Reactions   Actos [Pioglitazone Hydrochloride] Swelling and Other (See Comments)    Numbness in lips, HEADACHES SWELLING REACTION UNSPECIFIED    Avandia [Rosiglitazone] Swelling    Numbness in lips, headaches  SWELLING REACTION UNSPECIFIED    Tetanus Toxoids Other (See Comments)    Arm swelling, fainted, ?Respiratory distress  (horse serum)   Food Nausea Only    Eggplant    Medications Prior to Admission  Medication Sig Dispense Refill   Aflibercept (EYLEA) 2 MG/0.05ML SOLN See admin instructions. Every six weeks     amLODipine (NORVASC) 5 MG tablet Take 1 tablet (5 mg total) by mouth daily. 90 tablet 3   amoxicillin (AMOXIL) 500 MG capsule Take 1 capsule (500 mg total) by mouth every 8 (eight) hours until all taken (Patient taking differently: Take 2,000 mg by mouth See admin instructions. 4 HOURS PRIOR TO DENTAL VISITS.) 21 capsule 0   atorvastatin (LIPITOR) 40 MG tablet Take 1 tablet (40 mg total) by mouth daily. 90 tablet 3   Cholecalciferol (VITAMIN D) 50 MCG (2000 UT) tablet Take 2,000 Units by mouth daily.     Dapagliflozin Pro-metFORMIN ER  (XIGDUO XR) 12-998 MG TB24 Take 1 tablet by mouth daily. 90 tablet 3   Dulaglutide (TRULICITY) 4.5 MG/0.5ML SOPN Inject 4.5 mg into the skin once a week. 2 mL 11   DULoxetine (CYMBALTA) 60 MG capsule Take 1 capsule (60 mg total) by mouth daily. (Patient taking differently: Take 60 mg by mouth at bedtime.) 90 capsule 3   flecainide (TAMBOCOR) 100 MG tablet Take 1 tablet (100 mg total) by mouth 2 (two) times daily. 180 tablet 1   hydrALAZINE (APRESOLINE) 25 MG tablet TAKE 1 TABLET BY MOUTH TWO TIMES DAILY WITH BREAKFAST AND DINNER 180 tablet 3   ibuprofen (ADVIL) 200 MG tablet Take  200 mg by mouth every 6 (six) hours as needed for moderate pain.     indomethacin (INDOCIN) 50 MG capsule Take 1 capsule (50 mg total) by mouth 2(two) to 3 (three) times daily as needed for gout pain 20 capsule 3   insulin glargine-yfgn (SEMGLEE, YFGN,) 100 UNIT/ML Pen Inject 75 units into the skin twice a day for 90 days. (Patient taking differently: Inject 72 Units into the skin 2 (two) times daily.) 180 mL 4   levothyroxine (SYNTHROID) 137 MCG tablet Take 1 tablet (137 mcg total) by mouth every morning on an empty stomach 90 tablet 3   losartan (COZAAR) 50 MG tablet Take 1 tablet (50 mg total) by mouth daily. 90 tablet 2   Melatonin 10 MG CAPS Take 10 mg by mouth at bedtime as needed (sleep).     metoprolol tartrate (LOPRESSOR) 50 MG tablet Take 1 tablet (50 mg total) by mouth 2 (two) times daily. Please keep scheduled appointment for additional refills. 180 tablet 3   omeprazole (PRILOSEC) 20 MG capsule Take 1 capsule (20 mg total) by mouth daily. 90 capsule 3   rivaroxaban (XARELTO) 20 MG TABS tablet Take 1 tablet (20 mg total) by mouth daily with supper. 90 tablet 1   spironolactone (ALDACTONE) 25 MG tablet Take 1/2 tablet by mouth every other day. 45 tablet 3   Continuous Blood Gluc Receiver (FREESTYLE LIBRE 3 READER) DEVI Use to check blood sugar 1 each 0   Continuous Blood Gluc Sensor (FREESTYLE LIBRE 3 SENSOR)  MISC Use as directed to check blood sugar. Change every 14 days 6 each 4   glucose blood test strip Use to test blood sugar 3 times a day as directed 300 each 3   Insulin Pen Needle (UNIFINE PENTIPS) 32G X 4 MM MISC Use to adminster insulin subcutaneously twice daily 200 each 3    Results for orders placed or performed during the hospital encounter of 06/20/22 (from the past 48 hour(s))  Glucose, capillary     Status: Abnormal   Collection Time: 06/20/22  6:28 AM  Result Value Ref Range   Glucose-Capillary 182 (H) 70 - 99 mg/dL    Comment: Glucose reference range applies only to samples taken after fasting for at least 8 hours.   No results found.  Review of Systems  All other systems reviewed and are negative.   Blood pressure (!) 162/64, pulse 64, temperature 98.5 F (36.9 C), temperature source Oral, resp. rate 20, height 5' (1.524 m), weight 117 kg, SpO2 95 %. Physical Exam Constitutional:      Appearance: Normal appearance.  HENT:     Head: Normocephalic and atraumatic.  Eyes:     Pupils: Pupils are equal, round, and reactive to light.  Cardiovascular:     Rate and Rhythm: Normal rate.  Pulmonary:     Effort: Pulmonary effort is normal.  Musculoskeletal:     Left hand: Swelling and tenderness present.     Cervical back: Normal range of motion.     Comments: Left index and left long metacarpal phalangeal joint synovitis with pain and tenderness  Skin:    General: Skin is warm.  Neurological:     General: No focal deficit present.     Mental Status: She is alert and oriented to person, place, and time.  Psychiatric:        Mood and Affect: Mood normal.        Behavior: Behavior normal.        Thought  Content: Thought content normal.        Judgment: Judgment normal.      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  Patient has had injection splinting and therapy and pain persist.  Have  discussed the role of index and long metacarpal phalangeal joint synovectomy's with possible extensor realignment as necessary.  Patient understands risk benefits and wishes to proceed  Marlowe Shores, MD 06/20/2022, 7:32 AM

## 2022-06-20 NOTE — Brief Op Note (Signed)
06/20/2022  8:37 AM  PATIENT:  [name]  72 y.o. female  PRE-OPERATIVE DIAGNOSIS:  left index and left long finger metacarpal phalangeal joint pain  POST-OPERATIVE DIAGNOSIS:  left index and left long finger metacarpal phalangeal joint pain  PROCEDURE:  Procedure(s) with comments: left index finger and left long finger metacarpal phalangel joint syonvectomy (Left) - needs 1 hour of time..  SURGEON:  Surgeon(s) and Role:    * Dairl Ponder, MD - Primary  PHYSICIAN ASSISTANT:   ASSISTANTS: none   ANESTHESIA:   general  EBL: Minimal  BLOOD ADMINISTERED:none  DRAINS: none   LOCAL MEDICATIONS USED:  MARCAINE     SPECIMEN:  Biopsy / Limited Resection  DISPOSITION OF SPECIMEN:  PATHOLOGY  COUNTS:  YES  TOURNIQUET:   Total Tourniquet Time Documented: Forearm (Left) - 23 minutes Total: Forearm (Left) - 23 minutes   DICTATION: .Reubin Milan Dictation  PLAN OF CARE: Discharge to home after PACU  PATIENT DISPOSITION:  PACU - hemodynamically stable.   Delay start of Pharmacological VTE agent (>24hrs) due to surgical blood loss or risk of bleeding: yes

## 2022-06-20 NOTE — Transfer of Care (Signed)
Immediate Anesthesia Transfer of Care Note  Patient: [name]  Procedure(s) Performed: left index finger and left long finger metacarpal phalangel joint syonvectomy (Left: Finger)  Patient Location: PACU  Anesthesia Type:General  Level of Consciousness: awake, alert , oriented, and patient cooperative  Airway & Oxygen Therapy: Patient Spontanous Breathing and Patient connected to face mask oxygen  Post-op Assessment: Report given to RN, Post -op Vital signs reviewed and stable, and Patient moving all extremities X 4  Post vital signs: Reviewed and stable  Last Vitals:  Vitals Value Taken Time  BP 137/63 06/20/22 0850  Temp    Pulse 59 06/20/22 0852  Resp 20 06/20/22 0852  SpO2 99 % 06/20/22 0852  Vitals shown include unvalidated device data.  Last Pain:  Vitals:   06/20/22 0636  TempSrc:   PainSc: 5       Patients Stated Pain Goal: 2 (06/20/22 0636)  Complications: There were no known notable events for this encounter.

## 2022-06-21 ENCOUNTER — Encounter (HOSPITAL_COMMUNITY): Payer: Self-pay | Admitting: Orthopedic Surgery

## 2022-06-21 LAB — SURGICAL PATHOLOGY

## 2022-06-30 DIAGNOSIS — S63659A Sprain of metacarpophalangeal joint of unspecified finger, initial encounter: Secondary | ICD-10-CM | POA: Diagnosis not present

## 2022-06-30 DIAGNOSIS — M79642 Pain in left hand: Secondary | ICD-10-CM | POA: Diagnosis not present

## 2022-06-30 DIAGNOSIS — M24131 Other articular cartilage disorders, right wrist: Secondary | ICD-10-CM | POA: Diagnosis not present

## 2022-07-04 DIAGNOSIS — R0609 Other forms of dyspnea: Secondary | ICD-10-CM | POA: Diagnosis not present

## 2022-07-04 DIAGNOSIS — Z6841 Body Mass Index (BMI) 40.0 and over, adult: Secondary | ICD-10-CM | POA: Diagnosis not present

## 2022-07-05 ENCOUNTER — Ambulatory Visit (INDEPENDENT_AMBULATORY_CARE_PROVIDER_SITE_OTHER): Payer: 59 | Admitting: Family Medicine

## 2022-07-05 ENCOUNTER — Telehealth (INDEPENDENT_AMBULATORY_CARE_PROVIDER_SITE_OTHER): Payer: 59 | Admitting: Psychology

## 2022-07-05 ENCOUNTER — Encounter (INDEPENDENT_AMBULATORY_CARE_PROVIDER_SITE_OTHER): Payer: Self-pay | Admitting: Family Medicine

## 2022-07-05 VITALS — BP 123/69 | HR 63 | Temp 97.7°F | Ht 60.0 in | Wt 250.0 lb

## 2022-07-05 DIAGNOSIS — Z794 Long term (current) use of insulin: Secondary | ICD-10-CM | POA: Diagnosis not present

## 2022-07-05 DIAGNOSIS — E1122 Type 2 diabetes mellitus with diabetic chronic kidney disease: Secondary | ICD-10-CM | POA: Diagnosis not present

## 2022-07-05 DIAGNOSIS — E669 Obesity, unspecified: Secondary | ICD-10-CM

## 2022-07-05 DIAGNOSIS — F5089 Other specified eating disorder: Secondary | ICD-10-CM

## 2022-07-05 DIAGNOSIS — N1831 Chronic kidney disease, stage 3a: Secondary | ICD-10-CM | POA: Diagnosis not present

## 2022-07-05 DIAGNOSIS — Z6841 Body Mass Index (BMI) 40.0 and over, adult: Secondary | ICD-10-CM | POA: Diagnosis not present

## 2022-07-05 NOTE — Progress Notes (Signed)
Office: [phone]  /  Fax: [phone]    Date: July 05, 2022    Appointment Start Time: 3:24pm Duration: 34 minutes Provider: Lawerance Cruel, Psy.D. Type of Session: Intake for Individual Therapy  Location of Patient:  [facility] [place] [facility]  (private location) Location of Provider: Provider's home (private office) Type of Contact: Telepsychological Visit via MyChart Video Visit  Informed Consent: Prior to proceeding with today's appointment, two pieces of identifying information were obtained. In addition, [name]'s physical location at the time of this appointment was obtained as well a phone number she could be reached at in the event of technical difficulties. [name] and this provider participated in today's telepsychological service.   The provider's role was explained to [name]. The provider reviewed and discussed issues of confidentiality, privacy, and limits therein (e.g., reporting obligations). In addition to verbal informed consent, written informed consent for psychological services was obtained prior to the initial appointment. Since the clinic is not a 24/7 crisis center, mental health emergency resources were shared and this  provider explained MyChart, e-mail, voicemail, and/or other messaging systems should be utilized only for non-emergency reasons. This provider also explained that information obtained during appointments will be placed in [name]'s medical record and relevant information will be shared with other providers at [facility] for coordination of care. [name] agreed information may be shared with other [facility] providers as needed for coordination of care and by signing the service agreement document, she provided written consent for coordination of care. Prior to initiating telepsychological services, [name] completed an informed consent document, which included the development of a safety plan (i.e., an emergency contact and emergency  resources) in the event of an emergency/crisis. [name] verbally acknowledged understanding she is ultimately responsible for understanding her insurance benefits for telepsychological and in-person services. This provider also reviewed confidentiality, as it relates to telepsychological services. [name]  acknowledged understanding that appointments cannot be recorded without both party consent and she is aware she is responsible for securing confidentiality on her end of the session. [name] verbally consented to proceed.  Chief Complaint/HPI: [name] was referred by Dr. Quillian Quince. Per the note for the visit with Dr. Quillian Quince on 05/30/2022, "[name]'s last visit with [name] was >3 years ago. She is ready to get back on track with her weight loss efforts. [name]'s habits were reviewed today and are as follows: Her family eats meals together, her desired weight loss is 108 lbs, she has been heavy most of her life, she started gaining weight after childbirth, her heaviest weight ever was 260 pounds, she has significant food cravings issues, she frequently makes poor food choices, she frequently eats larger portions than normal, and she struggles with emotional eating." [name] last met with this provider on 02/20/2019.  During today's appointment, [name] reported due to stressors, she was unable to continue with the clinic in 2020, adding, she "got way off track." She was verbally administered a questionnaire assessing various behaviors related to emotional eating behaviors. [name] endorsed the following: find food is comforting to you and overeat frequently when you are bored or lonely. [name] believes the onset of emotional eating behaviors was likely during her teenage years and described the current frequency of emotional eating behaviors as "not more than once a week." In addition, [name] denied a history of binge eating behaviors. [name] denied a history of significantly restricting food intake, purging and  engagement in other compensatory strategies, and has never been diagnosed with an  eating disorder. Currently, she shared her routine has shifted due to recent surgery and being out of work, and she is not always eating regularly as she does not feel hungry.   Mental Status Examination:  Appearance: neat Behavior: appropriate to circumstances Mood: neutral Affect: mood congruent Speech: WNL Eye Contact: appropriate Psychomotor Activity: WNL Gait: unable to assess  Thought Process: linear, logical, and goal directed and denies suicidal, homicidal, and self-harm ideation, plan and intent  Thought Content/Perception: no hallucinations, delusions, bizarre thinking or behavior endorsed or observed Orientation: AAOx4 Memory/Concentration: memory, attention, language, and fund of knowledge intact  Insight/Judgment: fair  Family & Psychosocial History:See reported she is not in a relationship and she has two children. 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 Additionally, [name] shared her highest level of education obtained is "two years of college." Currently, [name]'s social support system consists of her sister, four brothers, and in laws. Moreover, [name] stated she resides alone.  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    Obesity    OSA on CPAP    uses CPAP nightly   Osteoarthritis    Osteoarthritis    Palpitations    Pinched nerve in neck    Pleurisy    Pneumonia    hx of   PONV (postoperative nausea and vomiting)    for 3-4 days after general anesthesia approx 10-12 years ago   Sciatica    Shortness of breath    Sleep apnea    Spinal stenosis    Swelling of both lower extremities    Swelling of knee joint, right    Synovitis of knee RIGHT   Trigger finger, left    left index   Vitamin D deficiency    Past Surgical History:  Procedure Laterality Date   CESAREAN SECTION  X3   KNEE ARTHROSCOPY  04/20/2011   Procedure: ARTHROSCOPY KNEE;  Surgeon: Loanne Drilling, MD;  Location: [facility] [place];  Service: Orthopedics;  Laterality: Right;  WITH SYNOVECTOMY   LEFT CARPAL TUNNEL / LEFT MIDDLE & RING FINGER TRIGGER RELEASE  08-26-2008   LEFT SHOULDER ARTHROSCOPY W/ DEBRIDEMENT  09-09-2003   LEFT SHOULDER ARTHROSCOPY/ LEFT THUMB TRIGGER RELEASE  02-22-2005   PHOTOCOAGULATION WITH LASER Right 03/20/2018   Procedure: PHOTOCOAGULATION WITH LASER;  Surgeon: Carmela Rima, MD;  Location: [facility] OR;  Service: Ophthalmology;  Laterality: Right;   PULLEY RELEASE LEFT LONG FINGER  07-14-2009   RADIAL HEAD ARTHROPLASTY Right 06/17/2018   Procedure: RADIAL HEAD ARTHROPLASTY;  Surgeon: Bjorn Pippin, MD;  Location: [facility] OR;  Service: Orthopedics;  Laterality:  Right;   RIGHT CARPAL TUNNEL/ RIGHT THUMB TRIGGER RELEASE'S  11-28-2006   RIGHT SHOULDER ARTHROSCOPY W/ ROTATOR CUFF REPAIR  01-13-2004   SHOULDER ARTHROSCOPY DISTAL CLAVICLE EXCISION AND OPEN ROTATOR CUFF REPAIR  09-07-2004   LEFT   SHOULDER ARTHROSCOPY W/ ACROMIAL REPAIR  11-29-2005   LEFT   SYNOVECTOMY Left 06/20/2022   Procedure: left index finger and left long finger metacarpal phalangel joint syonvectomy;  Surgeon: Dairl Ponder, MD;  Location: [facility] OR;  Service: Orthopedics;  Laterality: Left;  needs 1 hour of time..   TONSILLECTOMY AND  ADENOIDECTOMY  child   TOTAL KNEE ARTHROPLASTY  12-14-2009   RIGHT   TOTAL KNEE ARTHROPLASTY Left 11/05/2012   Procedure: LEFT TOTAL KNEE ARTHROPLASTY;  Surgeon: Loanne Drilling, MD;  Location: [facility];  Service: Orthopedics;  Laterality: Left;   TOTAL KNEE REVISION Right 07/29/2016   Procedure: RIGHT KNEE POLY-LINER EXCHANGE;  Surgeon: Kathryne Hitch, MD;  Location: [facility];  Service: Orthopedics;  Laterality: Right;   TRIGGER FINGER RELEASE Right 02/21/2013   Procedure: RIGHT RING A-1 PULLEY RELEASE    (MINOR PROCEDURE) ;  Surgeon: Wyn Forster., MD;  Location: [facility];  Service: Orthopedics;  Laterality: Right;   TRIGGER FINGER RELEASE Left 09/22/2016   Procedure: RELEASE TRIGGER FINGER LEFT INDEX FINGER;  Surgeon: Kathryne Hitch, MD;  Location: [facility] OR;  Service: Orthopedics;  Laterality: Left;   TRIGGER FINGER RELEASE Right 01/20/2017   Procedure: RELEASE TRIGGER FINGER/A-1 PULLEY RIGHT INDEX FINGER;  Surgeon: Marcene Corning, MD;  Location: [facility];  Service: Orthopedics;  Laterality: Right;   VITRECTOMY 25 GAUGE WITH SCLERAL BUCKLE Right 03/20/2018   Procedure: RIGHT EYE VITRECTOMY WITH  ENDOLASER PARENTAL PHOTOCOAGULATION 25 GAUGE;  Surgeon: Carmela Rima, MD;  Location: [facility] OR;  Service: Ophthalmology;  Laterality: Right;   WRIST ARTHROSCOPY WITH DEBRIDEMENT Right 05/01/2019   Procedure: WRIST ARTHROSCOPY WITH DEBRIDEMENT;  Surgeon: Dairl Ponder, MD;  Location: [facility];  Service: Orthopedics;  Laterality: Right;   Current Outpatient Medications on File Prior to Visit  Medication Sig Dispense Refill   Aflibercept (EYLEA) 2 MG/0.05ML SOLN See admin instructions. Every six weeks     amLODipine (NORVASC) 5 MG tablet Take 1 tablet (5 mg total) by mouth daily. 90 tablet 3   amoxicillin (AMOXIL) 500 MG capsule Take 1 capsule (500 mg total) by mouth every 8 (eight) hours until all taken (Patient taking differently: Take  2,000 mg by mouth See admin instructions. 4 HOURS PRIOR TO DENTAL VISITS.) 21 capsule 0   atorvastatin (LIPITOR) 40 MG tablet Take 1 tablet (40 mg total) by mouth daily. 90 tablet 3   Cholecalciferol (VITAMIN D) 50 MCG (2000 UT) tablet Take 2,000 Units by mouth daily.     Continuous Blood Gluc Receiver (FREESTYLE LIBRE 3 READER) DEVI Use to check blood sugar 1 each 0   Continuous Blood Gluc Sensor (FREESTYLE LIBRE 3 SENSOR) MISC Use as directed to check blood sugar. Change every 14 days 6 each 4   Dapagliflozin Pro-metFORMIN ER (XIGDUO XR) 12-998 MG TB24 Take 1 tablet by mouth daily. 90 tablet 3   Dulaglutide (TRULICITY) 4.5 MG/0.5ML SOPN Inject 4.5 mg into the skin once a week. 2 mL 11   DULoxetine (CYMBALTA) 60 MG capsule Take 1 capsule (60 mg total) by mouth daily. (Patient taking differently: Take 60 mg by mouth at bedtime.) 90 capsule 3   flecainide (TAMBOCOR) 100 MG tablet Take 1 tablet (100 mg total) by  mouth 2 (two) times daily. 180 tablet 1   glucose blood test strip Use to test blood sugar 3 times a day as directed 300 each 3   hydrALAZINE (APRESOLINE) 25 MG tablet TAKE 1 TABLET BY MOUTH TWO TIMES DAILY WITH BREAKFAST AND DINNER 180 tablet 3   HYDROcodone-acetaminophen (NORCO) 5-325 MG tablet Take 1 tablet by mouth every 6 hours as needed for moderate pain. 30 tablet 0   ibuprofen (ADVIL) 200 MG tablet Take 200 mg by mouth every 6 (six) hours as needed for moderate pain.     indomethacin (INDOCIN) 50 MG capsule Take 1 capsule (50 mg total) by mouth 2(two) to 3 (three) times daily as needed for gout pain 20 capsule 3   insulin glargine-yfgn (SEMGLEE, YFGN,) 100 UNIT/ML Pen Inject 75 units into the skin twice a day for 90 days. (Patient taking differently: Inject 72 Units into the skin 2 (two) times daily.) 180 mL 4   Insulin Pen Needle (UNIFINE PENTIPS) 32G X 4 MM MISC Use to adminster insulin subcutaneously twice daily 200 each 3   levothyroxine (SYNTHROID) 137 MCG tablet Take 1 tablet  (137 mcg total) by mouth every morning on an empty stomach 90 tablet 3   losartan (COZAAR) 50 MG tablet Take 1 tablet (50 mg total) by mouth daily. 90 tablet 2   Melatonin 10 MG CAPS Take 10 mg by mouth at bedtime as needed (sleep).     metoprolol tartrate (LOPRESSOR) 50 MG tablet Take 1 tablet (50 mg total) by mouth 2 (two) times daily. Please keep scheduled appointment for additional refills. 180 tablet 3   omeprazole (PRILOSEC) 20 MG capsule Take 1 capsule (20 mg total) by mouth daily. 90 capsule 3   rivaroxaban (XARELTO) 20 MG TABS tablet Take 1 tablet (20 mg total) by mouth daily with supper. 90 tablet 1   spironolactone (ALDACTONE) 25 MG tablet Take 1/2 tablet by mouth every other day. 45 tablet 3   No current facility-administered medications on file prior to visit.  Nabila stated she is medication compliant.   Mental Health History: Kollyns reported she has not attended therapeutic services since she last met with this provider in December 2020. She stated her PCP prescribed Cymbalta, noting she is unsure if it is helping. She was encouraged to discuss further with her prescribing provider. Cammy reported there is no history of hospitalizations for psychiatric concerns. Nyleah denied a family history of mental health/substance abuse related concerns. Oriyah reported there is no history of trauma including psychological, physical , and sexual abuse, as well as neglect.   Daphane described her typical mood lately as "good" aside from her hand. Katherin denied current alcohol use. She denied tobacco use. She denied illicit/recreational substance use. Furthermore, Devory indicated she is not experiencing the following: hallucinations and delusions, paranoia, symptoms of mania , social withdrawal, crying spells, panic attacks, memory concerns, attention and concentration issues, and obsessions and compulsions. She also denied history of and current suicidal ideation, plan, and intent; history of and  current homicidal ideation, plan, and intent; and history of and current engagement in self-harm.  Legal History: Khushbu reported there is no history of legal involvement.   Structured Assessments Results: The Patient Health Questionnaire-9 (PHQ-9) is a self-report measure that assesses symptoms and severity of depression over the course of the last two weeks. Amayra obtained a score of 1 suggesting minimal depression. Jany finds the endorsed symptoms to be somewhat difficult. [0= Not at all; 1= Several days; 2= More  than half the days; 3= Nearly every day] Little interest or pleasure in doing things 0  Feeling down, depressed, or hopeless 0  Trouble falling or staying asleep, or sleeping too much- change due to surgery 1  Feeling tired or having little energy 0  Poor appetite or overeating 0  Feeling bad about yourself --- or that you are a failure or have let yourself or your family down 0  Trouble concentrating on things, such as reading the newspaper or watching television 0  Moving or speaking so slowly that other people could have noticed? Or the opposite --- being so fidgety or restless that you have been moving around a lot more than usual 0  Thoughts that you would be better off dead or hurting yourself in some way 0  PHQ-9 Score 1    The Generalized Anxiety Disorder-7 (GAD-7) is a brief self-report measure that assesses symptoms of anxiety over the course of the last two weeks. Shawntay obtained a score of 0. [0= Not at all; 1= Several days; 2= Over half the days; 3= Nearly every day] Feeling nervous, anxious, on edge 0  Not being able to stop or control worrying 0  Worrying too much about different things 0  Trouble relaxing 0  Being so restless that it's hard to sit still 0  Becoming easily annoyed or irritable 0  Feeling afraid as if something awful might happen 0  GAD-7 Score 0   Interventions:  Conducted a chart review Focused on rapport building Verbally administered  PHQ-9 and GAD-7 for symptom monitoring Verbally administered Food & Mood questionnaire to assess various behaviors related to emotional eating Provided emphatic reflections and validation Collaborated with patient on a treatment goal  Psychoeducation provided regarding physical versus emotional hunger  Diagnostic Impressions & Provisional DSM-5 Diagnosis(es): Brooks reported engagement in emotional eating behaviors starting during her teenage years and described the current frequency as "not more than once a week." She denied engagement in any other disordered eating behaviors. Based on the aforementioned, the following diagnosis was assigned: F50.89 Other Specified Feeding or Eating Disorder, Emotional Eating Behaviors.  Plan: Almas appears able and willing to participate as evidenced by collaboration on a treatment goal, engagement in reciprocal conversation, and asking questions as needed for clarification. Based on Ellesse's schedule, the next appointment is scheduled for 08/02/2022 at 2:30pm, which will be via MyChart Video Visit. The following treatment goal was established: increase coping skills. This provider will regularly review the treatment plan and medical chart to keep informed of status changes. Marlise expressed understanding and agreement with the initial treatment plan of care. Elycia will be sent a handout via e-mail to utilize between now and the next appointment to increase awareness of hunger patterns and subsequent eating. Dala provided verbal consent during today's appointment for this provider to send the handout via e-mail. Additionally, Nishita was encouraged to eat smaller, frequent meals. She noted a plan to use reminders.

## 2022-07-06 DIAGNOSIS — E113511 Type 2 diabetes mellitus with proliferative diabetic retinopathy with macular edema, right eye: Secondary | ICD-10-CM | POA: Diagnosis not present

## 2022-07-06 NOTE — Progress Notes (Signed)
Chief Complaint:   OBESITY [name] is here to discuss her progress with her obesity treatment plan along with follow-up of her obesity related diagnoses. [name] is on the Category 3 Plan and keeping a food journal and adhering to recommended goals of 350-500 calories and 35+ grams of protein at supper daily and states she is following her eating plan approximately 75% of the time. [name] states she is walking for 30 minutes 2 times per week.  Today's visit was #: 3 Starting weight: 258 lbs Starting date: 05/30/2022 Today's weight: 250 lbs Today's date: 07/05/2022 Total lbs lost to date: 8 Total lbs lost since last in-office visit: 6  Interim History: [name] has done well with weight loss even with extra challenges while recovering from surgery. She has been off her normal routine.  Subjective:   1. Type 2 diabetes mellitus with stage 3a chronic kidney disease, with long-term current use of insulin Ebonee states fasting blood sugars ranges between 80-100, highest postprandial glucose runs in 160's. She decreased her Semglee to 70 units BID and she is still on Trulicity. She denies nausea, vomiting, or hypoglycemia.   Assessment/Plan:   1. Type 2 diabetes mellitus with stage 3a chronic kidney disease, with long-term current use of insulin Camilah will continue to check her glucose and watch for hypoglycemia with continued weight loss. She will continue with her diet and weight loss to treat diabetes mellitus.   2. BMI 45.0-49.9, adult  3. Obesity, Beginning BMI 50.39 [name] is currently in the action stage of change. As such, her goal is to continue with weight loss efforts. She has agreed to the Category 2 Plan and keeping a food journal and adhering to recommended goals of 350-500 calories and 35+ grams of protein at supper daily.   Exercise goals: As is.   Behavioral modification strategies: increasing lean protein intake and meal planning and cooking strategies.  [name] has  agreed to follow-up with our clinic in 4 weeks. She was informed of the importance of frequent follow-up visits to maximize her success with intensive lifestyle modifications for her multiple health conditions.   Objective:   Blood pressure 123/69, pulse 63, temperature 97.7 F (36.5 C), height 5' (1.524 m), weight 250 lb (113.4 kg), SpO2 92 %. Body mass index is 48.82 kg/m.  Lab Results  Component Value Date   CREATININE 1.27 (H) 05/30/2022   BUN 34 (H) 05/30/2022   NA 139 05/30/2022   K 4.9 05/30/2022   CL 103 05/30/2022   CO2 17 (L) 05/30/2022   Lab Results  Component Value Date   ALT 13 05/30/2022   AST 19 05/30/2022   ALKPHOS 87 05/30/2022   BILITOT <0.2 05/30/2022   Lab Results  Component Value Date   HGBA1C 7.7 (H) 05/30/2022   HGBA1C 7.4 (H) 11/28/2017   HGBA1C 6.8 (H) 07/18/2017   HGBA1C 7.7 (H) 07/20/2016   HGBA1C 7.9 09/18/2014   Lab Results  Component Value Date   INSULIN 24.3 05/30/2022   Lab Results  Component Value Date   TSH 0.790 05/30/2022   Lab Results  Component Value Date   CHOL 111 05/30/2022   HDL 35 (L) 05/30/2022   LDLCALC 55 05/30/2022   TRIG 114 05/30/2022   Lab Results  Component Value Date   VD25OH 34.8 05/30/2022   VD25OH 31.4 11/28/2017   VD25OH 30.5 07/18/2017   Lab Results  Component Value Date   WBC 10.0 05/30/2022   HGB 12.4 05/30/2022   HCT  40.0 05/30/2022   MCV 88 05/30/2022   PLT 378 05/30/2022   No results found for: "IRON", "TIBC", "FERRITIN"  Attestation Statements:   Reviewed by clinician on day of visit: allergies, medications, problem list, medical history, surgical history, family history, social history, and previous encounter notes.  Time spent on visit including pre-visit chart review and post-visit care and charting was 30 minutes.   I, Burt Knack, am acting as transcriptionist for Quillian Quince, MD.  I have reviewed the above documentation for accuracy and completeness, and I agree with the  above. -  Quillian Quince, MD

## 2022-07-12 ENCOUNTER — Other Ambulatory Visit (HOSPITAL_COMMUNITY): Payer: Self-pay

## 2022-07-12 ENCOUNTER — Other Ambulatory Visit: Payer: Self-pay

## 2022-07-12 MED ORDER — HYDRALAZINE HCL 25 MG PO TABS
25.0000 mg | ORAL_TABLET | Freq: Two times a day (BID) | ORAL | 1 refills | Status: DC
  Filled 2022-07-12: qty 180, 90d supply, fill #0

## 2022-07-13 DIAGNOSIS — E113312 Type 2 diabetes mellitus with moderate nonproliferative diabetic retinopathy with macular edema, left eye: Secondary | ICD-10-CM | POA: Diagnosis not present

## 2022-07-14 ENCOUNTER — Other Ambulatory Visit (HOSPITAL_COMMUNITY): Payer: Self-pay

## 2022-07-15 ENCOUNTER — Other Ambulatory Visit (HOSPITAL_COMMUNITY): Payer: Self-pay

## 2022-07-20 ENCOUNTER — Other Ambulatory Visit (HOSPITAL_COMMUNITY): Payer: Self-pay

## 2022-07-25 ENCOUNTER — Other Ambulatory Visit (HOSPITAL_COMMUNITY): Payer: Self-pay

## 2022-08-02 ENCOUNTER — Telehealth (INDEPENDENT_AMBULATORY_CARE_PROVIDER_SITE_OTHER): Payer: 59 | Admitting: Psychology

## 2022-08-03 ENCOUNTER — Encounter (INDEPENDENT_AMBULATORY_CARE_PROVIDER_SITE_OTHER): Payer: Self-pay | Admitting: Family Medicine

## 2022-08-03 ENCOUNTER — Ambulatory Visit (INDEPENDENT_AMBULATORY_CARE_PROVIDER_SITE_OTHER): Payer: 59 | Admitting: Family Medicine

## 2022-08-03 VITALS — BP 139/76 | HR 68 | Temp 97.6°F | Ht 60.0 in | Wt 253.0 lb

## 2022-08-03 DIAGNOSIS — E669 Obesity, unspecified: Secondary | ICD-10-CM | POA: Diagnosis not present

## 2022-08-03 DIAGNOSIS — M549 Dorsalgia, unspecified: Secondary | ICD-10-CM | POA: Insufficient documentation

## 2022-08-03 DIAGNOSIS — G8929 Other chronic pain: Secondary | ICD-10-CM | POA: Diagnosis not present

## 2022-08-03 DIAGNOSIS — Z6841 Body Mass Index (BMI) 40.0 and over, adult: Secondary | ICD-10-CM | POA: Diagnosis not present

## 2022-08-09 ENCOUNTER — Encounter: Payer: Self-pay | Admitting: Family Medicine

## 2022-08-09 ENCOUNTER — Ambulatory Visit (INDEPENDENT_AMBULATORY_CARE_PROVIDER_SITE_OTHER): Payer: 59

## 2022-08-09 ENCOUNTER — Telehealth (INDEPENDENT_AMBULATORY_CARE_PROVIDER_SITE_OTHER): Payer: Self-pay | Admitting: Psychology

## 2022-08-09 ENCOUNTER — Ambulatory Visit (INDEPENDENT_AMBULATORY_CARE_PROVIDER_SITE_OTHER): Payer: 59 | Admitting: Family Medicine

## 2022-08-09 VITALS — BP 126/72 | HR 66 | Ht 60.0 in | Wt 255.2 lb

## 2022-08-09 DIAGNOSIS — M5136 Other intervertebral disc degeneration, lumbar region: Secondary | ICD-10-CM | POA: Diagnosis not present

## 2022-08-09 DIAGNOSIS — M5441 Lumbago with sciatica, right side: Secondary | ICD-10-CM | POA: Diagnosis not present

## 2022-08-09 DIAGNOSIS — M5442 Lumbago with sciatica, left side: Secondary | ICD-10-CM

## 2022-08-09 DIAGNOSIS — G8929 Other chronic pain: Secondary | ICD-10-CM

## 2022-08-09 DIAGNOSIS — M4316 Spondylolisthesis, lumbar region: Secondary | ICD-10-CM | POA: Diagnosis not present

## 2022-08-09 DIAGNOSIS — M545 Low back pain, unspecified: Secondary | ICD-10-CM | POA: Diagnosis not present

## 2022-08-09 NOTE — Progress Notes (Signed)
Stefanie Payor, PhD, LAT, ATC acting as a scribe for Stefanie Graham, MD.  [name] is a 72 y.o. female who presents to [facility] at [facility] today for low back pain x several years, worsening over the past week. Sx started flaring up after air travel to and from [place]. Pt locates pain to bilateral lower back, L>R. Notes radiating pain into the hips and legs. Denies groin pain. Notes weakness because she feels like she will fall down d/t pain. Intermittent n/t. Has been seeing pain management for the past 5 years, receiving ESI, not working as well as before; last injection 04/2022 provided 4 days of relief. Less pain when sitting, more pain with ambulation. Occasional shooting pain into the glutes when sitting. Sx improve when leaning forward while sitting.  Prolonged standing and especially back extension seem to make her symptoms worse.  Radiating pain: glute, hip, down the leg past the knee LE numbness/tingling: intermittent LE weakness: more related to pain Aggravates: ambulation Treatments tried: ESI  Pertinent review of systems: No fevers or chills  Relevant historical information: Lumbar spinal stenosis prior MRI.   Exam:  BP 126/72   Pulse 66   Ht 5' (1.524 m)   Wt 255 lb 3.2 oz (115.8 kg)   SpO2 94%   BMI 49.84 kg/m  General: Well Developed, well nourished, and in no acute distress.   MSK: L-spine: Normal appearing Nontender palpation. Decreased motion lumbar spine worse with extension. Lower extremity strength is intact. Reflexes are intact.   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  No acute fractures are visible. The space between T11 and T12 abnormal appearing with possible erosion or erosive change. Await formal radiology review     Assessment and Plan: 72 y.o. female with low back pain with pain radiating  down both legs worse with extension.  This is very concerning for spinal stenosis.  She did have moderate spinal stenosis at L4-5 on lumbar spine from 2019.  She has previously done pretty well with intermittent epidural steroid injections [facility].  However her most recent one in February was not helpful.  Plan on updating lumbar spine MRI to further evaluate why her symptoms are worsening and in for future injection planning. Await formal radiology overread.  She does have some abnormalities seen at the lower portion of her thoracic spine  PDMP not reviewed this encounter. Orders Placed This Encounter  Procedures   DG Lumbar Spine 2-3 Views    Standing Status:   Future    Number of Occurrences:   1    Standing Expiration Date:   09/09/2022    Order Specific Question:   Reason for Exam (SYMPTOM  OR DIAGNOSIS REQUIRED)    Answer:   low back pain    Order Specific Question:   Preferred imaging location?    Answer:   [facility]   MR LUMBAR SPINE WO CONTRAST    Standing Status:   Future    Standing Expiration Date:   09/09/2022    Order Specific Question:   What is the patient's sedation requirement?    Answer:   No Sedation    Order Specific Question:   Does the patient have a pacemaker or implanted devices?    Answer:   No    Order Specific Question:   Preferred imaging location?    Answer:   [id number] (table limit-550lbs)   No orders of the defined types were placed in this encounter.    Discussed warning signs or symptoms. Please see discharge instructions. Patient expresses understanding.   The above documentation has been reviewed and is accurate and complete [name], [name]

## 2022-08-09 NOTE — Progress Notes (Signed)
Chief Complaint:   OBESITY Remedy is here to discuss her progress with her obesity treatment plan along with follow-up of her obesity related diagnoses. [name] is on the Category 2 Plan and keeping a food journal and adhering to recommended goals of 350-500 calories and 35+ grams of protein at supper and states she is following her eating plan approximately 40% of the time. [name] states she is walking for 15-20 minutes 3 times per week.  Today's visit was #: 4 Starting weight: 258 lbs Starting date: 05/30/2022 Today's weight: 253 lbs Today's date: 08/03/2022 Total lbs lost to date: 5 Total lbs lost since last in-office visit: 0  Interim History: Patient has been on vacation and did some celebration eating. She is retaining some water weight. She is working on getting back on track with her eating plan.   Subjective:   1. Other chronic back pain Patient's pain is worsening and now makes walking short distances hurt to the point it takes her breath away.   Assessment/Plan:   1. Other chronic back pain We will refer the patient to Dr. Denyse Amass for a second opinion.   - Ambulatory referral to Sports Medicine  2. BMI 45.0-49.9, adult (HCC)  3. Obesity, Beginning BMI 50.39 [name] is currently in the action stage of change. As such, her goal is to continue with weight loss efforts. She has agreed to the Category 2 Plan.   Exercise goals: As is.   Behavioral modification strategies: increasing lean protein intake.  [name] has agreed to follow-up with our clinic in 3 to 4 weeks. She was informed of the importance of frequent follow-up visits to maximize her success with intensive lifestyle modifications for her multiple health conditions.   Objective:   Blood pressure 139/76, pulse 68, temperature 97.6 F (36.4 C), height 5' (1.524 m), weight 253 lb (114.8 kg), SpO2 (!) 87 %. Body mass index is 49.41 kg/m.  Lab Results  Component Value Date   CREATININE 1.27 (H) 05/30/2022    BUN 34 (H) 05/30/2022   NA 139 05/30/2022   K 4.9 05/30/2022   CL 103 05/30/2022   CO2 17 (L) 05/30/2022   Lab Results  Component Value Date   ALT 13 05/30/2022   AST 19 05/30/2022   ALKPHOS 87 05/30/2022   BILITOT <0.2 05/30/2022   Lab Results  Component Value Date   HGBA1C 7.7 (H) 05/30/2022   HGBA1C 7.4 (H) 11/28/2017   HGBA1C 6.8 (H) 07/18/2017   HGBA1C 7.7 (H) 07/20/2016   HGBA1C 7.9 09/18/2014   Lab Results  Component Value Date   INSULIN 24.3 05/30/2022   Lab Results  Component Value Date   TSH 0.790 05/30/2022   Lab Results  Component Value Date   CHOL 111 05/30/2022   HDL 35 (L) 05/30/2022   LDLCALC 55 05/30/2022   TRIG 114 05/30/2022   Lab Results  Component Value Date   VD25OH 34.8 05/30/2022   VD25OH 31.4 11/28/2017   VD25OH 30.5 07/18/2017   Lab Results  Component Value Date   WBC 10.0 05/30/2022   HGB 12.4 05/30/2022   HCT 40.0 05/30/2022   MCV 88 05/30/2022   PLT 378 05/30/2022   No results found for: "IRON", "TIBC", "FERRITIN"  Attestation Statements:   Reviewed by clinician on day of visit: allergies, medications, problem list, medical history, surgical history, family history, social history, and previous encounter notes.  Time spent on visit including pre-visit chart review and post-visit care and charting was 30  minutes.   I, Burt Knack, am acting as transcriptionist for Quillian Quince, MD.  I have reviewed the above documentation for accuracy and completeness, and I agree with the above. -  Quillian Quince, MD

## 2022-08-09 NOTE — Patient Instructions (Addendum)
Thank you for coming in today.   Please get an Xray today before you leave   You should hear from MRI scheduling within 1 week. If you do not hear please let me know.    We will get your records from Dr. Nash Dimmer office

## 2022-08-09 NOTE — Telephone Encounter (Signed)
  Office: [phone]  /  Fax: [phone]  Date of Call: Aug 09, 2022  Time of Call: 9:26am Provider: Lawerance Cruel, PsyD  CONTENT:  This provider called [name] to check-in and schedule a follow-up appointment. A HIPAA compliant voicemail was left requesting a call back.   PLAN: This provider will wait for [name] to call back. No further follow-up planned by this provider.

## 2022-08-12 ENCOUNTER — Other Ambulatory Visit (HOSPITAL_COMMUNITY): Payer: Self-pay

## 2022-08-12 ENCOUNTER — Other Ambulatory Visit: Payer: Self-pay

## 2022-08-15 ENCOUNTER — Other Ambulatory Visit (HOSPITAL_COMMUNITY): Payer: Self-pay

## 2022-08-15 NOTE — Progress Notes (Signed)
Lumbar spine x-ray shows extensive arthritis changes multiple levels.

## 2022-08-22 ENCOUNTER — Other Ambulatory Visit (HOSPITAL_COMMUNITY): Payer: Self-pay

## 2022-08-23 ENCOUNTER — Other Ambulatory Visit (HOSPITAL_COMMUNITY): Payer: Self-pay

## 2022-08-23 ENCOUNTER — Other Ambulatory Visit: Payer: Self-pay

## 2022-08-23 MED ORDER — HYDRALAZINE HCL 25 MG PO TABS
25.0000 mg | ORAL_TABLET | Freq: Two times a day (BID) | ORAL | 1 refills | Status: DC
  Filled 2022-08-23 – 2022-09-30 (×2): qty 180, 90d supply, fill #0
  Filled 2023-01-09: qty 180, 90d supply, fill #1

## 2022-08-24 ENCOUNTER — Other Ambulatory Visit (HOSPITAL_COMMUNITY): Payer: Self-pay

## 2022-08-24 DIAGNOSIS — E113512 Type 2 diabetes mellitus with proliferative diabetic retinopathy with macular edema, left eye: Secondary | ICD-10-CM | POA: Diagnosis not present

## 2022-08-28 ENCOUNTER — Ambulatory Visit
Admission: RE | Admit: 2022-08-28 | Discharge: 2022-08-28 | Disposition: A | Discharge status: Home / Self Care | Payer: 59 | Source: Ambulatory Visit | Attending: Family Medicine | Admitting: Family Medicine

## 2022-08-28 DIAGNOSIS — G8929 Other chronic pain: Secondary | ICD-10-CM

## 2022-08-28 DIAGNOSIS — M47816 Spondylosis without myelopathy or radiculopathy, lumbar region: Secondary | ICD-10-CM | POA: Diagnosis not present

## 2022-08-31 ENCOUNTER — Other Ambulatory Visit (HOSPITAL_COMMUNITY): Payer: Self-pay

## 2022-08-31 DIAGNOSIS — E113511 Type 2 diabetes mellitus with proliferative diabetic retinopathy with macular edema, right eye: Secondary | ICD-10-CM | POA: Diagnosis not present

## 2022-09-05 ENCOUNTER — Ambulatory Visit (INDEPENDENT_AMBULATORY_CARE_PROVIDER_SITE_OTHER): Payer: 59 | Admitting: Family Medicine

## 2022-09-05 VITALS — BP 146/82 | HR 64 | Ht 60.0 in | Wt 254.0 lb

## 2022-09-05 DIAGNOSIS — G8929 Other chronic pain: Secondary | ICD-10-CM

## 2022-09-05 DIAGNOSIS — M48062 Spinal stenosis, lumbar region with neurogenic claudication: Secondary | ICD-10-CM | POA: Diagnosis not present

## 2022-09-05 DIAGNOSIS — M5442 Lumbago with sciatica, left side: Secondary | ICD-10-CM

## 2022-09-05 DIAGNOSIS — M5441 Lumbago with sciatica, right side: Secondary | ICD-10-CM

## 2022-09-05 NOTE — Progress Notes (Unsigned)
   [name], PhD, LAT, ATC acting as a scribe for Stefanie Graham, MD.  [name] is a 72 y.o. female who presents to [facility] at [facility] today for f/u LBP w/ MRI review. She has been seen previously at [facility].Pt was last seen by Dr. Denyse Amass on 08/09/22 and was a new L-spine MRI was ordered since [facility] in February was not beneficial. Today, pt reports her LBP is about the same. She notes feeling "squished" any time she tries to pick up anything slightly heavy, ie taking the trash out.   Dx imaging: 08/28/22 L-spine MRI  08/09/22 L-spine XR  Pertinent review of systems: ***  Relevant historical information: ***   Exam:  There were no vitals taken for this visit. General: Well Developed, well nourished, and in no acute distress.   MSK: ***    Lab and Radiology Results No results found for this or any previous visit (from the past 72 hour(s)). No results found.     Assessment and Plan: 72 y.o. female with ***   PDMP not reviewed this encounter. No orders of the defined types were placed in this encounter.  No orders of the defined types were placed in this encounter.    Discussed warning signs or symptoms. Please see discharge instructions. Patient expresses understanding.   ***

## 2022-09-05 NOTE — Patient Instructions (Addendum)
Thank you for coming in today.   Please call [facility] at [phone] to schedule your spine injection.    I've referred you to Physical Therapy.  Let us know if you don't hear from them in one week.

## 2022-09-06 ENCOUNTER — Encounter (INDEPENDENT_AMBULATORY_CARE_PROVIDER_SITE_OTHER): Payer: Self-pay | Admitting: Family Medicine

## 2022-09-06 ENCOUNTER — Telehealth: Payer: Self-pay

## 2022-09-06 ENCOUNTER — Ambulatory Visit (INDEPENDENT_AMBULATORY_CARE_PROVIDER_SITE_OTHER): Payer: 59 | Admitting: Family Medicine

## 2022-09-06 VITALS — BP 156/73 | HR 63 | Temp 98.2°F | Ht 60.0 in | Wt 252.0 lb

## 2022-09-06 DIAGNOSIS — N1831 Chronic kidney disease, stage 3a: Secondary | ICD-10-CM | POA: Diagnosis not present

## 2022-09-06 DIAGNOSIS — E669 Obesity, unspecified: Secondary | ICD-10-CM | POA: Diagnosis not present

## 2022-09-06 DIAGNOSIS — Z6841 Body Mass Index (BMI) 40.0 and over, adult: Secondary | ICD-10-CM | POA: Diagnosis not present

## 2022-09-06 DIAGNOSIS — E1122 Type 2 diabetes mellitus with diabetic chronic kidney disease: Secondary | ICD-10-CM | POA: Diagnosis not present

## 2022-09-06 DIAGNOSIS — M549 Dorsalgia, unspecified: Secondary | ICD-10-CM

## 2022-09-06 DIAGNOSIS — Z794 Long term (current) use of insulin: Secondary | ICD-10-CM | POA: Diagnosis not present

## 2022-09-06 DIAGNOSIS — G8929 Other chronic pain: Secondary | ICD-10-CM | POA: Diagnosis not present

## 2022-09-06 DIAGNOSIS — I10 Essential (primary) hypertension: Secondary | ICD-10-CM | POA: Diagnosis not present

## 2022-09-06 DIAGNOSIS — I158 Other secondary hypertension: Secondary | ICD-10-CM

## 2022-09-06 NOTE — Telephone Encounter (Signed)
Patient with diagnosis of afib on Xarelto for anticoagulation.    Procedure: lumbar ESI Date of procedure: TBD  CHA2DS2-VASc Score = 5  This indicates a 7.2% annual risk of stroke. The patient's score is based upon: CHF History: 1 HTN History: 1 Diabetes History: 1 Stroke History: 0 Vascular Disease History: 0 Age Score: 1 Gender Score: 1  CrCl 82mL/min using adjusted body weight due to obesity Platelet count 378K  Per office protocol, patient can hold Xarelto for 3 days prior to procedure.    **This guidance is not considered finalized until pre-operative APP has relayed final recommendations.**

## 2022-09-06 NOTE — Telephone Encounter (Signed)
Pharmacy please advise on holding Xarelto prior to lumbar ESI scheduled for TBD. Thank you.   

## 2022-09-06 NOTE — Telephone Encounter (Signed)
   Pre-operative Risk Assessment    Patient Name: [name]  DOB: 1950/09/06 MRN: [id number]      Request for Surgical Clearance    Procedure:   LUMBAR EPIDURAL  Date of Surgery:  Clearance TBD                                 Surgeon:  NONE INDICATED  Surgeon's Group or Practice Name:  [facility]  Phone number:  [phone] Fax number:  [phone]   Type of Clearance Requested:   - Pharmacy:  Hold Rivaroxaban (Xarelto) HOLD FOR 2 DOES   Type of Anesthesia:  Not Indicated   Additional requests/questions:    SignedMichaelle Copas   09/06/2022, 5:08 PM

## 2022-09-06 NOTE — Progress Notes (Signed)
[facility]  Office: [phone]  /  Fax: [phone]  WEIGHT SUMMARY AND BIOMETRICS  Anthropometric Measurements Height: 5' (1.524 m) Weight: 252 lb (114.3 kg) BMI (Calculated): 49.22 Weight at Last Visit: 253 lb Weight Lost Since Last Visit: 1 lb Weight Gained Since Last Visit: 0 Starting Weight: 258 lb Total Weight Loss (lbs): 6 lb (2.722 kg)   Body Composition  Body Fat %: 59.4 % Fat Mass (lbs): 149.6 lbs Muscle Mass (lbs): 97.2 lbs Visceral Fat Rating : 0.24   Other Clinical Data Fasting: No Labs: No Today's Visit #: 5 Starting Date: 05/30/22    Chief Complaint: OBESITY   Discussed the use of AI scribe software for clinical note transcription with the patient, who gave verbal consent to proceed.  History of Present Illness   The patient, with a history of obesity and hypertension, presents with worsening back pain. They report that their condition has deteriorated since their last MRI five years ago. They have seen a specialist who has recommended cortisone injections and physical therapy. The patient has had cortisone injections in the past, which provided temporary relief. They are scheduled to start physical therapy soon. The patient also reports that they have lost two inches in height over the past three years and sometimes feel even shorter. She feels her elevated BP may be due to her increased pain.  In addition to their back pain, the patient is also managing their blood sugar levels. They have been testing their blood sugar twice a day, once in the morning and once in the evening but she now has a CGM. However, they have noticed some fluctuations in their blood sugar levels, with fasting readings ranging from 60 to 120. They have not made any changes to their insulin regimen.          PHYSICAL EXAM:  Blood pressure (!) 156/73, pulse 63, temperature 98.2 F (36.8 C), height 5' (1.524 m), weight 252 lb (114.3 kg), SpO2 90 %. Body mass index is 49.22  kg/m.  DIAGNOSTIC DATA REVIEWED:  BMET    Component Value Date/Time   NA 139 05/30/2022 1048   K 4.9 05/30/2022 1048   CL 103 05/30/2022 1048   CO2 17 (L) 05/30/2022 1048   GLUCOSE 186 (H) 05/30/2022 1048   GLUCOSE 226 (H) 08/06/2019 0111   BUN 34 (H) 05/30/2022 1048   CREATININE 1.27 (H) 05/30/2022 1048   CALCIUM 9.2 05/30/2022 1048   GFRNONAA 37 (L) 08/06/2019 0111   GFRAA 43 (L) 08/06/2019 0111   Lab Results  Component Value Date   HGBA1C 7.7 (H) 05/30/2022   HGBA1C 7.9 09/18/2014   Lab Results  Component Value Date   INSULIN 24.3 05/30/2022   Lab Results  Component Value Date   TSH 0.790 05/30/2022   CBC    Component Value Date/Time   WBC 10.0 05/30/2022 1048   WBC 11.0 (H) 08/06/2019 0111   RBC 4.53 05/30/2022 1048   RBC 4.57 08/06/2019 0111   HGB 12.4 05/30/2022 1048   HCT 40.0 05/30/2022 1048   PLT 378 05/30/2022 1048   MCV 88 05/30/2022 1048   MCH 27.4 05/30/2022 1048   MCH 29.3 08/06/2019 0111   MCHC 31.0 (L) 05/30/2022 1048   MCHC 31.4 08/06/2019 0111   RDW 15.4 05/30/2022 1048   Iron Studies No results found for: "IRON", "TIBC", "FERRITIN", "IRONPCTSAT" Lipid Panel     Component Value Date/Time   CHOL 111 05/30/2022 1048   TRIG 114 05/30/2022 1048   HDL 35 (  L) 05/30/2022 1048   LDLCALC 55 05/30/2022 1048   Hepatic Function Panel     Component Value Date/Time   PROT 6.6 05/30/2022 1048   ALBUMIN 3.9 05/30/2022 1048   AST 19 05/30/2022 1048   ALT 13 05/30/2022 1048   ALKPHOS 87 05/30/2022 1048   BILITOT <0.2 05/30/2022 1048      Component Value Date/Time   TSH 0.790 05/30/2022 1048   Nutritional Lab Results  Component Value Date   VD25OH 34.8 05/30/2022   VD25OH 31.4 11/28/2017   VD25OH 30.5 07/18/2017     Assessment and Plan    Obesity: Patient is following a category two plan approximately 70% of the time. No current exercise regimen. Noted a 1-pound weight loss since last visit. -Continue category two plan. -Encourage  resumption of exercise as tolerated when pain improves.  Hypertension: Blood pressure readings of 158/78 and 156/73. -Continue current management plan and will recheck BP in 1 month at next visit, hopefully when she is in less pain.  Chronic Back Pain: Worsening symptoms as per recent MRI. Previous cortisone injections provided temporary relief. Patient is scheduled for physical therapy. -Continue with physical therapy as planned. -Consider dry needling if beneficial in the past. -Advise patient to avoid overexertion and to engage in gentle stretching exercises.  Diabetes: Patient reports episodes of hypoglycemia and hyperglycemia. No changes in insulin regimen. -Continue current insulin regimen. -Advise patient to monitor blood glucose levels closely and to report any frequent episodes of hypoglycemia. -Schedule follow-up with endocrinologist in September.  Follow-up in 4 weeks to assess progress and adjust treatment plan as necessary.          She was informed of the importance of frequent follow up visits to maximize her success with intensive lifestyle modifications for her multiple health conditions.    Quillian Quince, MD

## 2022-09-07 ENCOUNTER — Other Ambulatory Visit: Payer: 59

## 2022-09-07 NOTE — Telephone Encounter (Signed)
   Patient Name: [name]  DOB: 10-01-1950 MRN: [id number]  Primary Cardiologist: Charlton Haws, MD  Clinical pharmacists have reviewed the patient's past medical history, labs, and current medications as part of preoperative protocol coverage. The following recommendations have been made:  Per office protocol, patient can hold Xarelto for 3 days prior to procedure.    I will route this recommendation to the requesting party via Epic fax function and remove from pre-op pool.  Please call with questions.  Napoleon Form, Leodis Rains, NP 09/07/2022, 7:47 AM

## 2022-09-09 ENCOUNTER — Other Ambulatory Visit (HOSPITAL_COMMUNITY): Payer: Self-pay

## 2022-09-09 ENCOUNTER — Other Ambulatory Visit: Payer: Self-pay | Admitting: Physician Assistant

## 2022-09-09 ENCOUNTER — Other Ambulatory Visit: Payer: Self-pay

## 2022-09-09 MED ORDER — AMLODIPINE BESYLATE 5 MG PO TABS
5.0000 mg | ORAL_TABLET | Freq: Every day | ORAL | 3 refills | Status: DC
  Filled 2022-09-09: qty 90, 90d supply, fill #0
  Filled 2022-09-20 – 2022-12-09 (×3): qty 90, 90d supply, fill #1
  Filled 2023-03-09: qty 90, 90d supply, fill #2
  Filled 2023-06-01 (×2): qty 90, 90d supply, fill #3

## 2022-09-12 ENCOUNTER — Ambulatory Visit
Admission: RE | Admit: 2022-09-12 | Discharge: 2022-09-12 | Disposition: A | Discharge status: Home / Self Care | Payer: 59 | Source: Ambulatory Visit | Attending: Family Medicine | Admitting: Family Medicine

## 2022-09-12 DIAGNOSIS — M48061 Spinal stenosis, lumbar region without neurogenic claudication: Secondary | ICD-10-CM | POA: Diagnosis not present

## 2022-09-12 DIAGNOSIS — G8929 Other chronic pain: Secondary | ICD-10-CM

## 2022-09-12 MED ORDER — IOPAMIDOL (ISOVUE-M 200) INJECTION 41%
1.0000 mL | Freq: Once | INTRAMUSCULAR | Status: AC
  Administered 2022-09-12: 1 mL via EPIDURAL

## 2022-09-12 MED ORDER — METHYLPREDNISOLONE ACETATE 40 MG/ML INJ SUSP (RADIOLOG
80.0000 mg | Freq: Once | INTRAMUSCULAR | Status: AC
  Administered 2022-09-12: 80 mg via EPIDURAL

## 2022-09-12 NOTE — Discharge Instructions (Signed)

## 2022-09-14 ENCOUNTER — Other Ambulatory Visit (HOSPITAL_COMMUNITY): Payer: Self-pay

## 2022-09-20 ENCOUNTER — Other Ambulatory Visit: Payer: Self-pay

## 2022-09-20 ENCOUNTER — Other Ambulatory Visit (HOSPITAL_COMMUNITY): Payer: Self-pay

## 2022-09-22 ENCOUNTER — Other Ambulatory Visit: Payer: Self-pay

## 2022-09-22 ENCOUNTER — Encounter: Payer: Self-pay | Admitting: Rehabilitative and Restorative Service Providers"

## 2022-09-22 ENCOUNTER — Ambulatory Visit: Payer: 59 | Attending: Family Medicine | Admitting: Rehabilitative and Restorative Service Providers"

## 2022-09-22 DIAGNOSIS — M5441 Lumbago with sciatica, right side: Secondary | ICD-10-CM | POA: Insufficient documentation

## 2022-09-22 DIAGNOSIS — G8929 Other chronic pain: Secondary | ICD-10-CM | POA: Insufficient documentation

## 2022-09-22 DIAGNOSIS — R262 Difficulty in walking, not elsewhere classified: Secondary | ICD-10-CM | POA: Diagnosis not present

## 2022-09-22 DIAGNOSIS — M5459 Other low back pain: Secondary | ICD-10-CM | POA: Diagnosis not present

## 2022-09-22 DIAGNOSIS — M6281 Muscle weakness (generalized): Secondary | ICD-10-CM | POA: Diagnosis not present

## 2022-09-22 DIAGNOSIS — R252 Cramp and spasm: Secondary | ICD-10-CM | POA: Insufficient documentation

## 2022-09-22 DIAGNOSIS — M5442 Lumbago with sciatica, left side: Secondary | ICD-10-CM | POA: Diagnosis not present

## 2022-09-22 DIAGNOSIS — R293 Abnormal posture: Secondary | ICD-10-CM | POA: Insufficient documentation

## 2022-09-22 DIAGNOSIS — R2689 Other abnormalities of gait and mobility: Secondary | ICD-10-CM | POA: Diagnosis not present

## 2022-09-22 NOTE — Therapy (Signed)
OUTPATIENT PHYSICAL THERAPY THORACOLUMBAR EVALUATION   Patient Name: [name] MRN: [id number] DOB:1951-02-19, 72 y.o., female Today's Date: 09/22/2022  END OF SESSION:  PT End of Session - 09/22/22 1540     Visit Number 1    Date for PT Re-Evaluation 11/18/22    Authorization Type [employer]    Progress Note Due on Visit 10    PT Start Time 1530    PT Stop Time 1610    PT Time Calculation (min) 40 min    Activity Tolerance Patient tolerated treatment well    Behavior During Therapy WFL for tasks assessed/performed             Past Medical History:  Diagnosis Date   A-fib (HCC)    Anemia    hx of   Arthritis    Chest pain    Constipation    Depression    Depression    Diabetes mellitus ORAL MEDS   Diabetic retinopathy (HCC)    Diabetic retinopathy (HCC)    Dyspnea    with minimal exertion-deconditioned   Dyspnea    Dysrhythmia    a-fib   Food allergy    GERD (gastroesophageal reflux disease)    occasional   Gout    Heartburn    History of kidney stones    Hypercholesteremia    Hyperlipidemia    Hypertension    Hypertensive kidney disease    Hypothyroidism    Joint pain    Kidney problem    Knee pain, right    Left arm numbness DUE TO CERVICAL PINCHED NERVE   Obesity    OSA on CPAP    uses CPAP nightly   Osteoarthritis    Osteoarthritis    Palpitations    Pinched nerve in neck    Pleurisy    Pneumonia    hx of   PONV (postoperative nausea and vomiting)    for 3-4 days after general anesthesia approx 10-12 years ago   Sciatica    Shortness of breath    Sleep apnea    Spinal stenosis    Swelling of both lower extremities    Swelling of knee joint, right    Synovitis of knee RIGHT   Trigger finger, left    left index   Vitamin D deficiency    Past Surgical History:  Procedure Laterality Date   CESAREAN SECTION  X3   KNEE ARTHROSCOPY  04/20/2011   Procedure: ARTHROSCOPY KNEE;  Surgeon: Loanne Drilling, MD;  Location: [facility];  Service: Orthopedics;  Laterality: Right;  WITH SYNOVECTOMY   LEFT CARPAL TUNNEL / LEFT MIDDLE & RING FINGER TRIGGER RELEASE  08-26-2008   LEFT SHOULDER ARTHROSCOPY W/ DEBRIDEMENT  09-09-2003   LEFT SHOULDER ARTHROSCOPY/ LEFT THUMB TRIGGER RELEASE  02-22-2005   PHOTOCOAGULATION WITH LASER Right 03/20/2018   Procedure: PHOTOCOAGULATION WITH LASER;  Surgeon: Carmela Rima, MD;  Location: [facility] OR;  Service: Ophthalmology;  Laterality: Right;   PULLEY RELEASE LEFT LONG FINGER  07-14-2009   RADIAL HEAD ARTHROPLASTY Right 06/17/2018   Procedure: RADIAL HEAD ARTHROPLASTY;  Surgeon: Bjorn Pippin, MD;  Location: [facility] OR;  Service: Orthopedics;  Laterality: Right;   RIGHT CARPAL TUNNEL/ RIGHT THUMB TRIGGER RELEASE'S  11-28-2006   RIGHT SHOULDER ARTHROSCOPY W/ ROTATOR CUFF REPAIR  01-13-2004   SHOULDER ARTHROSCOPY DISTAL CLAVICLE EXCISION AND OPEN ROTATOR CUFF REPAIR  09-07-2004   LEFT   SHOULDER ARTHROSCOPY W/ ACROMIAL REPAIR  11-29-2005   LEFT  SYNOVECTOMY Left 06/20/2022   Procedure: left index finger and left long finger metacarpal phalangel joint syonvectomy;  Surgeon: Dairl Ponder, MD;  Location: [facility];  Service: Orthopedics;  Laterality: Left;  needs 1 hour of time..   TONSILLECTOMY AND ADENOIDECTOMY  child   TOTAL KNEE ARTHROPLASTY  12-14-2009   RIGHT   TOTAL KNEE ARTHROPLASTY Left 11/05/2012   Procedure: LEFT TOTAL KNEE ARTHROPLASTY;  Surgeon: Loanne Drilling, MD;  Location: [facility];  Service: Orthopedics;  Laterality: Left;   TOTAL KNEE REVISION Right 07/29/2016   Procedure: RIGHT KNEE POLY-LINER EXCHANGE;  Surgeon: Kathryne Hitch, MD;  Location: [facility];  Service: Orthopedics;  Laterality: Right;   TRIGGER FINGER RELEASE Right 02/21/2013   Procedure: RIGHT RING A-1 PULLEY RELEASE    (MINOR PROCEDURE) ;  Surgeon: Wyn Forster., MD;  Location: [facility];  Service: Orthopedics;  Laterality: Right;   TRIGGER FINGER RELEASE Left 09/22/2016   Procedure:  RELEASE TRIGGER FINGER LEFT INDEX FINGER;  Surgeon: Kathryne Hitch, MD;  Location: [facility];  Service: Orthopedics;  Laterality: Left;   TRIGGER FINGER RELEASE Right 01/20/2017   Procedure: RELEASE TRIGGER FINGER/A-1 PULLEY RIGHT INDEX FINGER;  Surgeon: Marcene Corning, MD;  Location: [facility];  Service: Orthopedics;  Laterality: Right;   VITRECTOMY 25 GAUGE WITH SCLERAL BUCKLE Right 03/20/2018   Procedure: RIGHT EYE VITRECTOMY WITH  ENDOLASER PARENTAL PHOTOCOAGULATION 25 GAUGE;  Surgeon: Carmela Rima, MD;  Location: [facility];  Service: Ophthalmology;  Laterality: Right;   WRIST ARTHROSCOPY WITH DEBRIDEMENT Right 05/01/2019   Procedure: WRIST ARTHROSCOPY WITH DEBRIDEMENT;  Surgeon: Dairl Ponder, MD;  Location: [facility];  Service: Orthopedics;  Laterality: Right;   Patient Active Problem List   Diagnosis Date Noted   Notalgia 08/03/2022   BMI 45.0-49.9, adult (HCC) 06/13/2022   Other secondary hypertension 06/13/2022   SOBOE (shortness of breath on exertion) 05/30/2022   Depression screening 05/30/2022   Morbid obesity (HCC) 05/30/2022   Class 3 severe obesity with serious comorbidity and body mass index (BMI) of 50.0 to 59.9 in adult [facility]) 05/30/2022   Paroxysmal atrial fibrillation (HCC) 10/22/2021   Hypercoagulable state due to paroxysmal atrial fibrillation (HCC) 10/22/2021   Radial head fracture, closed 06/16/2018   Closed fracture dislocation of elbow 06/16/2018   Other fatigue 07/18/2017   Shortness of breath on exertion 07/18/2017   Type 2 diabetes mellitus with retinopathy, with long-term current use of insulin (HCC) 07/18/2017   Vitamin D deficiency 07/18/2017   B12 nutritional deficiency 07/18/2017   Other specified hypothyroidism 07/18/2017   Acute pain of right knee 12/22/2016   Trigger index finger of left hand 09/22/2016   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 requiring isolated polyethylene liner exchange (HCC)  07/29/2016   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) 04/14/2016   Diabetes (HCC) 01/08/2014   Essential hypertension 01/08/2014   Hyperlipidemia 01/08/2014   Postoperative anemia due to acute blood loss 11/22/2012   Hyponatremia 11/06/2012   OA (osteoarthritis) of knee 11/05/2012   Villonodular synovitis of knee 04/20/2011    PCP: Thana Ates, MD  REFERRING PROVIDER: Rodolph Bong, MD   REFERRING DIAG: M54.42,M54.41,G89.29 (ICD-10-CM) - Chronic bilateral low back pain with bilateral sciatica   Rationale for Evaluation and Treatment: Rehabilitation  THERAPY DIAG:  Other low back pain  Cramp and spasm  Muscle weakness (generalized)  Other abnormalities of gait and mobility  ONSET DATE: "a couple of years", but started worsening  in May 2024  SUBJECTIVE:                                                                                                                                                                                           SUBJECTIVE STATEMENT: Pt reports that she flew to [place] for her granddaughter's high school graduation and noticed that as she was pulling her suitcase, she started having more pain.  She states that pain progressively worsened since then.  She states that in early July, she had a cortisone injection and her back is feeling 60% better since then.  PERTINENT HISTORY:  OA, A-Fib, DM, bilateral TKA, bilateral shoulder surgeries  PAIN:  Are you having pain? Yes: NPRS scale: 4/10 Pain location: low back Pain description: pain radiates from buttocks down bilateral LE past knees (Left more involved than Right) Aggravating factors: coughing, prolonged sitting and standing  Relieving factors: injection , sleep, weight shifting    PRECAUTIONS: None  RED FLAGS: None   WEIGHT BEARING RESTRICTIONS: No  FALLS:  Has patient fallen in last 6 months? No  LIVING ENVIRONMENT: Lives with: lives alone Lives in:  House/apartment Stairs: No Has following equipment at home: Single point cane  OCCUPATION: Works in admissions in [employer]  PLOF: Independent and Leisure: crafting  PATIENT GOALS: To be able to walk without hunching over and less pain.  NEXT MD VISIT: as needed  OBJECTIVE:   DIAGNOSTIC FINDINGS:  Lumbar MRI on 08/28/2022: IMPRESSION: 1. Lumbar spine spondylosis as described above. 2. No acute osseous injury of the lumbar spine.  Lumbar Radiograph on 08/09/2022: IMPRESSION: Multilevel degenerative changes of the lumbar spine as discussed above.  PATIENT SURVEYS:  Eval:  FOTO 40 (projected 56 by visit 12)   COGNITION: Overall cognitive status: Within functional limits for tasks assessed     SENSATION: WFL  MUSCLE LENGTH: Hamstrings: Tightness bilaterally   POSTURE: rounded shoulders, forward head, increased lumbar lordosis, increased thoracic kyphosis, and posterior pelvic tilt   LUMBAR ROM:   Some decreased motion with pain  LOWER EXTREMITY ROM:     WFL  LOWER EXTREMITY MMT:    Eval:  Bilateral LE strength grossly 4/5 throughout with some pain noted  LUMBAR SPECIAL TESTS:  Slump test: Positive  FUNCTIONAL TESTS:  09/22/2022: 5 times sit to stand: 10.15 sec Timed up and go (TUG): 12.84 sec  GAIT: Distance walked: 200 ft Assistive device utilized: None Level of assistance: Complete Independence Comments: Pt with antalgic gait  TODAY'S TREATMENT:  DATE: 09/22/2022  Review of HEP:  hamstring stretch, marching, hip abduction, hip extension Seated blue pball rollout 5x5 sec  PATIENT EDUCATION:  Education details: proper form during exercises, aquatic therapy, dry needling  Person educated: Patient Education method: Medical illustrator and Handouts Education comprehension: verbalized understanding  HOME EXERCISE  PROGRAM: Access Code: [email or web address] URL: [email or web address] Date: 09/22/2022 Prepared by: Clydie Braun Lidiya Reise  Exercises - Seated Hamstring Stretch  - 1 x daily - 7 x weekly - 2 reps - 20 hold - Standing March with Counter Support  - 1 x daily - 7 x weekly - 2 sets - 10 reps - Standing Hip Abduction with Unilateral Counter Support  - 1 x daily - 7 x weekly - 2 sets - 10 reps - Standing Hip Extension with Unilateral Counter Support  - 1 x daily - 7 x weekly - 2 sets - 10 reps  ASSESSMENT:  CLINICAL IMPRESSION: Patient is a 72 y.o. female who was seen today for physical therapy evaluation and treatment for low back pain. Patient presented with an antalgic gait with compensatory bilateral hip external rotation, decreased step length, and wide base of support likely due to weak hip abductors and extensors and limited tissue extensibility of the hamstrings and gastrocs. Patients pain was easily reproducible with lumbar AROM in addition to a mechanism of injury suggesting musculoskeletal origin.Patient shared that pain was quite intolerable at work having to frequently taking breaks for pain modulation. Patient also shared that while grocery shopping they have to rely on the shopping cart for additional support to finish activity suggesting high pain irritability and decreased endurance and mobility.  Patient responded well to initial HEP that was administered and shared that the exercises slightly decreased concordant pain. Patient would benefit from skilled physical therapy interventions to address limitations in sitting, standing, and squatting required for community activity and occupational responsibilities.   OBJECTIVE IMPAIRMENTS: Abnormal gait, decreased activity tolerance, decreased balance, decreased coordination, decreased endurance, decreased mobility, difficulty walking, decreased ROM, decreased strength, impaired flexibility, improper body mechanics, postural dysfunction, obesity, and  pain.   ACTIVITY LIMITATIONS: carrying, lifting, bending, sitting, standing, squatting, and locomotion level  PARTICIPATION LIMITATIONS: cleaning, laundry, community activity, and occupation  PERSONAL FACTORS: Age, Fitness, Profession, and 3+ comorbidities: OA, Bilat TKE, DM, A-Fib  are also affecting patient's functional outcome.   REHAB POTENTIAL: Good  CLINICAL DECISION MAKING: Evolving/moderate complexity  EVALUATION COMPLEXITY: Moderate   GOALS: Goals reviewed with patient? Yes  SHORT TERM GOALS: Target date: 10/14/2022  Patient will demonstrate competence with basic HEP. Baseline: Goal status: INITIAL  2.  Patient will perform a 6 MWT test to receive a baseline measurement to assess aerobic endurance and to set prolonged walking goals.  Baseline: patient shared they avoid prolonged walking due to pain  Goal status: INITIAL  3.  Patient will demonstrate proper technique when lifting light weight (5-10#) objects off of the floor without increased pain.  Baseline: MOI was [place] suitcase at airport  Goal status: INITIAL  4.  Patient will demonstrate proper log roll technique in and out bed to avoid increased pain. Baseline: transferring was difficult  Goal status: INITIAL   LONG TERM GOALS: Target date: 11/18/2022  Patient will perform advanced HEP independently upon discharge.  Baseline:  Goal status: INITIAL  2.  Patient will decrease TUG time to no greater than 10 sec in order to decrease the risk of future falls.  Baseline: 12.84 sec Goal status: INITIAL  3.  Patient will improve their FOTO  score to 56% in order to participate in ADLs and IADLs without increased pain.  Baseline: 40% Goal status: INITIAL  4.  Patient will be able to walk for 20 minutes without increased pain for community activity, such as grocery shopping.  Baseline: patient shared that they have trouble walking to and from the bathroom at work  Goal status: INITIAL  5.  Patient will  increase global LE strength to at least 4+ to 5-/5 required for getting on and off chair at work.  Baseline: 4/5, patient reported fear of falling  Goal status: INITIAL   PLAN:  PT FREQUENCY: 1-2x/week  PT DURATION: 8 weeks  PLANNED INTERVENTIONS: Therapeutic exercises, Therapeutic activity, Neuromuscular re-education, Balance training, Gait training, Patient/Family education, Self Care, Joint mobilization, Stair training, Aquatic Therapy, Dry Needling, Electrical stimulation, Spinal manipulation, Spinal mobilization, Cryotherapy, Moist heat, Taping, Traction, Ultrasound, Ionotophoresis 4mg /ml Dexamethasone, Manual therapy, and Re-evaluation.  PLAN FOR NEXT SESSION: Plan to assess , assess lifting technique, review and progress HEP, focus on lower extremity strengthening and mobility   Meghan Crowfoot, SPT Reather Laurence, PT  [facility] [address] [place], [place] [address] Phone # [phone] Fax [phone]  09/22/2022, 4:14 PM

## 2022-09-22 NOTE — Patient Instructions (Signed)
     [facility] Welcome to [facility]! Here you will find all the information you will need regarding your pool therapy. If you have further questions at any time, please call our office at [phone]. After completing your initial evaluation in the [facility], you may be eligible to complete a portion of your therapy in the pool. A typical week of therapy will consist of 1-2 typical physical therapy visits at our [facility] location and an additional session of therapy in the pool located at the [facility] at [address]. [address], [place] [address]. The phone number at the pool site is [phone]. Please call this number if you are running late or need to cancel your appointment.  Aquatic therapy will be offered on Wednesday mornings and Friday afternoons. Each session will last approximately 45 minutes. All scheduling and payments for aquatic therapy sessions, including cancelations, will be done through our [facility] location.  To be eligible for aquatic therapy, these criteria must be met: You must be able to independently change in the locker room and get to the pool deck. A caregiver can come with you to help if needed. There are benches for a caregiver to sit on next to the pool. No one with an open wound is permitted in the pool.  Handicap parking is available in the front and there is a drop off option for even closer accessibility. Please arrive 15 minutes prior to your appointment to prepare for your pool session. You must sign in at the front desk upon your arrival. Please be sure to attend to any toileting needs prior to entering the pool. Locker rooms for changing are available.  There is direct access to the pool deck from the locker room. You can lock your belongings in a locker or bring them with you poolside. Your therapist will greet you on the pool deck. There may be other swimmers in the pool at the  same time but your session is one-on-one with the therapist.   

## 2022-09-28 ENCOUNTER — Ambulatory Visit: Payer: 59 | Admitting: Physical Therapy

## 2022-09-28 ENCOUNTER — Encounter: Payer: Self-pay | Admitting: Physical Therapy

## 2022-09-28 DIAGNOSIS — M5459 Other low back pain: Secondary | ICD-10-CM | POA: Diagnosis not present

## 2022-09-28 DIAGNOSIS — R262 Difficulty in walking, not elsewhere classified: Secondary | ICD-10-CM | POA: Diagnosis not present

## 2022-09-28 DIAGNOSIS — M5442 Lumbago with sciatica, left side: Secondary | ICD-10-CM | POA: Diagnosis not present

## 2022-09-28 DIAGNOSIS — R252 Cramp and spasm: Secondary | ICD-10-CM | POA: Diagnosis not present

## 2022-09-28 DIAGNOSIS — R2689 Other abnormalities of gait and mobility: Secondary | ICD-10-CM

## 2022-09-28 DIAGNOSIS — M6281 Muscle weakness (generalized): Secondary | ICD-10-CM

## 2022-09-28 DIAGNOSIS — M5441 Lumbago with sciatica, right side: Secondary | ICD-10-CM | POA: Diagnosis not present

## 2022-09-28 DIAGNOSIS — R293 Abnormal posture: Secondary | ICD-10-CM | POA: Diagnosis not present

## 2022-09-28 DIAGNOSIS — G8929 Other chronic pain: Secondary | ICD-10-CM | POA: Diagnosis not present

## 2022-09-29 ENCOUNTER — Ambulatory Visit: Payer: 59

## 2022-09-29 DIAGNOSIS — R2689 Other abnormalities of gait and mobility: Secondary | ICD-10-CM | POA: Diagnosis not present

## 2022-09-29 DIAGNOSIS — M5441 Lumbago with sciatica, right side: Secondary | ICD-10-CM | POA: Diagnosis not present

## 2022-09-29 DIAGNOSIS — R252 Cramp and spasm: Secondary | ICD-10-CM

## 2022-09-29 DIAGNOSIS — M5459 Other low back pain: Secondary | ICD-10-CM | POA: Diagnosis not present

## 2022-09-29 DIAGNOSIS — R293 Abnormal posture: Secondary | ICD-10-CM

## 2022-09-29 DIAGNOSIS — M5442 Lumbago with sciatica, left side: Secondary | ICD-10-CM | POA: Diagnosis not present

## 2022-09-29 DIAGNOSIS — M6281 Muscle weakness (generalized): Secondary | ICD-10-CM | POA: Diagnosis not present

## 2022-09-29 DIAGNOSIS — R262 Difficulty in walking, not elsewhere classified: Secondary | ICD-10-CM | POA: Diagnosis not present

## 2022-09-29 DIAGNOSIS — G8929 Other chronic pain: Secondary | ICD-10-CM | POA: Diagnosis not present

## 2022-09-29 NOTE — Therapy (Signed)
OUTPATIENT PHYSICAL THERAPY THORACOLUMBAR Treatment Note   Patient Name: [name] MRN: [id number] DOB:Jul 20, 1950, 72 y.o., female Today's Date: 09/29/2022  END OF SESSION:  PT End of Session - 09/29/22 0806     Visit Number 3    Date for PT Re-Evaluation 11/18/22    Authorization Type [employer]    Progress Note Due on Visit 10    PT Start Time 0803    PT Stop Time 0852    PT Time Calculation (min) 49 min    Activity Tolerance Patient tolerated treatment well    Behavior During Therapy WFL for tasks assessed/performed             Past Medical History:  Diagnosis Date   A-fib (HCC)    Anemia    hx of   Arthritis    Chest pain    Constipation    Depression    Depression    Diabetes mellitus ORAL MEDS   Diabetic retinopathy (HCC)    Diabetic retinopathy (HCC)    Dyspnea    with minimal exertion-deconditioned   Dyspnea    Dysrhythmia    a-fib   Food allergy    GERD (gastroesophageal reflux disease)    occasional   Gout    Heartburn    History of kidney stones    Hypercholesteremia    Hyperlipidemia    Hypertension    Hypertensive kidney disease    Hypothyroidism    Joint pain    Kidney problem    Knee pain, right    Left arm numbness DUE TO CERVICAL PINCHED NERVE   Obesity    OSA on CPAP    uses CPAP nightly   Osteoarthritis    Osteoarthritis    Palpitations    Pinched nerve in neck    Pleurisy    Pneumonia    hx of   PONV (postoperative nausea and vomiting)    for 3-4 days after general anesthesia approx 10-12 years ago   Sciatica    Shortness of breath    Sleep apnea    Spinal stenosis    Swelling of both lower extremities    Swelling of knee joint, right    Synovitis of knee RIGHT   Trigger finger, left    left index   Vitamin D deficiency    Past Surgical History:  Procedure Laterality Date   CESAREAN SECTION  X3   KNEE ARTHROSCOPY  04/20/2011   Procedure: ARTHROSCOPY KNEE;  Surgeon: Loanne Drilling, MD;  Location: [facility]  [place];  Service: Orthopedics;  Laterality: Right;  WITH SYNOVECTOMY   LEFT CARPAL TUNNEL / LEFT MIDDLE & RING FINGER TRIGGER RELEASE  08-26-2008   LEFT SHOULDER ARTHROSCOPY W/ DEBRIDEMENT  09-09-2003   LEFT SHOULDER ARTHROSCOPY/ LEFT THUMB TRIGGER RELEASE  02-22-2005   PHOTOCOAGULATION WITH LASER Right 03/20/2018   Procedure: PHOTOCOAGULATION WITH LASER;  Surgeon: Carmela Rima, MD;  Location: [facility] OR;  Service: Ophthalmology;  Laterality: Right;   PULLEY RELEASE LEFT LONG FINGER  07-14-2009   RADIAL HEAD ARTHROPLASTY Right 06/17/2018   Procedure: RADIAL HEAD ARTHROPLASTY;  Surgeon: Bjorn Pippin, MD;  Location: [facility] OR;  Service: Orthopedics;  Laterality: Right;   RIGHT CARPAL TUNNEL/ RIGHT THUMB TRIGGER RELEASE'S  11-28-2006   RIGHT SHOULDER ARTHROSCOPY W/ ROTATOR CUFF REPAIR  01-13-2004   SHOULDER ARTHROSCOPY DISTAL CLAVICLE EXCISION AND OPEN ROTATOR CUFF REPAIR  09-07-2004   LEFT   SHOULDER ARTHROSCOPY W/ ACROMIAL REPAIR  11-29-2005   LEFT  SYNOVECTOMY Left 06/20/2022   Procedure: left index finger and left long finger metacarpal phalangel joint syonvectomy;  Surgeon: Dairl Ponder, MD;  Location: [facility];  Service: Orthopedics;  Laterality: Left;  needs 1 hour of time..   TONSILLECTOMY AND ADENOIDECTOMY  child   TOTAL KNEE ARTHROPLASTY  12-14-2009   RIGHT   TOTAL KNEE ARTHROPLASTY Left 11/05/2012   Procedure: LEFT TOTAL KNEE ARTHROPLASTY;  Surgeon: Loanne Drilling, MD;  Location: [facility];  Service: Orthopedics;  Laterality: Left;   TOTAL KNEE REVISION Right 07/29/2016   Procedure: RIGHT KNEE POLY-LINER EXCHANGE;  Surgeon: Kathryne Hitch, MD;  Location: [facility];  Service: Orthopedics;  Laterality: Right;   TRIGGER FINGER RELEASE Right 02/21/2013   Procedure: RIGHT RING A-1 PULLEY RELEASE    (MINOR PROCEDURE) ;  Surgeon: Wyn Forster., MD;  Location: [facility];  Service: Orthopedics;  Laterality: Right;   TRIGGER FINGER RELEASE Left 09/22/2016    Procedure: RELEASE TRIGGER FINGER LEFT INDEX FINGER;  Surgeon: Kathryne Hitch, MD;  Location: [facility];  Service: Orthopedics;  Laterality: Left;   TRIGGER FINGER RELEASE Right 01/20/2017   Procedure: RELEASE TRIGGER FINGER/A-1 PULLEY RIGHT INDEX FINGER;  Surgeon: Marcene Corning, MD;  Location: [facility];  Service: Orthopedics;  Laterality: Right;   VITRECTOMY 25 GAUGE WITH SCLERAL BUCKLE Right 03/20/2018   Procedure: RIGHT EYE VITRECTOMY WITH  ENDOLASER PARENTAL PHOTOCOAGULATION 25 GAUGE;  Surgeon: Carmela Rima, MD;  Location: [facility] OR;  Service: Ophthalmology;  Laterality: Right;   WRIST ARTHROSCOPY WITH DEBRIDEMENT Right 05/01/2019   Procedure: WRIST ARTHROSCOPY WITH DEBRIDEMENT;  Surgeon: Dairl Ponder, MD;  Location: [facility];  Service: Orthopedics;  Laterality: Right;   Patient Active Problem List   Diagnosis Date Noted   Notalgia 08/03/2022   BMI 45.0-49.9, adult (HCC) 06/13/2022   Other secondary hypertension 06/13/2022   SOBOE (shortness of breath on exertion) 05/30/2022   Depression screening 05/30/2022   Morbid obesity (HCC) 05/30/2022   Class 3 severe obesity with serious comorbidity and body mass index (BMI) of 50.0 to 59.9 in adult [facility]) 05/30/2022   Paroxysmal atrial fibrillation (HCC) 10/22/2021   Hypercoagulable state due to paroxysmal atrial fibrillation (HCC) 10/22/2021   Radial head fracture, closed 06/16/2018   Closed fracture dislocation of elbow 06/16/2018   Other fatigue 07/18/2017   Shortness of breath on exertion 07/18/2017   Type 2 diabetes mellitus with retinopathy, with long-term current use of insulin (HCC) 07/18/2017   Vitamin D deficiency 07/18/2017   B12 nutritional deficiency 07/18/2017   Other specified hypothyroidism 07/18/2017   Acute pain of right knee 12/22/2016   Trigger index finger of left hand 09/22/2016   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 requiring isolated polyethylene liner  exchange (HCC) 07/29/2016   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) 04/14/2016   Diabetes (HCC) 01/08/2014   Essential hypertension 01/08/2014   Hyperlipidemia 01/08/2014   Postoperative anemia due to acute blood loss 11/22/2012   Hyponatremia 11/06/2012   OA (osteoarthritis) of knee 11/05/2012   Villonodular synovitis of knee 04/20/2011    PCP: Thana Ates, MD  REFERRING PROVIDER: Rodolph Bong, MD   REFERRING DIAG: M54.42,M54.41,G89.29 (ICD-10-CM) - Chronic bilateral low back pain with bilateral sciatica   Rationale for Evaluation and Treatment: Rehabilitation  THERAPY DIAG:  Other low back pain  Cramp and spasm  Muscle weakness (generalized)  Difficulty in walking, not elsewhere classified  Abnormal posture  ONSET DATE: "a couple of years",  but started worsening in May 2024  SUBJECTIVE:                                                                                                                                                                                           SUBJECTIVE STATEMENT: Patient reports she had her first aquatic session.  "I felt good after but the water was too hot"  She reports her pain today at 6/10.    PERTINENT HISTORY:  OA, A-Fib, DM, bilateral TKA, bilateral shoulder surgeries  PAIN: 09/29/22 Are you having pain? Yes: NPRS scale: 6/10 Pain location: low back Pain description: pain radiates from buttocks down bilateral LE past knees (Left more involved than Right) Aggravating factors: coughing, prolonged sitting and standing  Relieving factors: injection , sleep, weight shifting    PRECAUTIONS: None  RED FLAGS: None   WEIGHT BEARING RESTRICTIONS: No  FALLS:  Has patient fallen in last 6 months? No  LIVING ENVIRONMENT: Lives with: lives alone Lives in: House/apartment Stairs: No Has following equipment at home: Single point cane  OCCUPATION: Works in admissions in [employer]  PLOF: Independent and Leisure:  crafting  PATIENT GOALS: To be able to walk without hunching over and less pain.  NEXT MD VISIT: as needed  OBJECTIVE:   DIAGNOSTIC FINDINGS:  Lumbar MRI on 08/28/2022: IMPRESSION: 1. Lumbar spine spondylosis as described above. 2. No acute osseous injury of the lumbar spine.  Lumbar Radiograph on 08/09/2022: IMPRESSION: Multilevel degenerative changes of the lumbar spine as discussed above.  PATIENT SURVEYS:  Eval:  FOTO 40 (projected 56 by visit 12)   COGNITION: Overall cognitive status: Within functional limits for tasks assessed     SENSATION: WFL  MUSCLE LENGTH: Hamstrings: Tightness bilaterally   POSTURE: rounded shoulders, forward head, increased lumbar lordosis, increased thoracic kyphosis, and posterior pelvic tilt   LUMBAR ROM:   Some decreased motion with pain  LOWER EXTREMITY ROM:     WFL  LOWER EXTREMITY MMT:    Eval:  Bilateral LE strength grossly 4/5 throughout with some pain noted  LUMBAR SPECIAL TESTS:  Slump test: Positive  FUNCTIONAL TESTS:  09/22/2022: 5 times sit to stand: 10.15 sec Timed up and go (TUG): 12.84 sec  GAIT: Distance walked: 200 ft Assistive device utilized: None Level of assistance: Complete Independence Comments: Pt with antalgic gait  TODAY'S TREATMENT:    09/29/22: Nustep x 5 min level 3 (PT present to discuss status) Standing hamstring stretch 3 x 30 sec each LE (in room 13 with table lowered to lowest position) Standing quad stretch 3 x 30 sec each LE (in room 13 with table lowered to  lowest position) Supine hook lying PPT x 20 Lower trunk rotation x 20 PPT with march x 20 PPT with alternating arms and legs x 20 PPT with clam x 20 with blue loop  Sidelying clam  09/28/22: Patient seen for aquatic therapy today.  Treatment took place in water 2.5-4 feet deep depending upon activity.  Pt entered the pool via stairs with moderate use of railings, caution used. Pt requires buoyancy of water for support and to  offload joints with strengthening exercises.  Seated water bench with 75% submersion Pt performed seated LE AROM exercises 20x in all planes with concurrent discussion of current status, pain, [facility] and lastly water principles and how she can better utilize them. Pt verbally understood all principles and concepts. 75% water depth for water walking 6 lengths with UE single buoy weights for arm movements. Same weights for arm exercises with focus on core stabilization including: arm abd/add 10x, horizontal abd/add 10x, and shoulder ext/flexion 10x. Bicep and tricep curls with mitten hands 2x10. Standing core /lat press with single buoy UE wt 10x with VC to contract her glutes.                                                                                                                            DATE: 09/22/2022  Review of HEP:  hamstring stretch, marching, hip abduction, hip extension Seated blue pball rollout 5x5 sec  PATIENT EDUCATION:  Education details: proper form during exercises, aquatic therapy, dry needling  Person educated: Patient Education method: Medical illustrator and Handouts Education comprehension: verbalized understanding  HOME EXERCISE PROGRAM: Access Code: [email or web address] URL: [email or web address] Date: 09/22/2022 Prepared by: Clydie Braun Menke  Exercises - Seated Hamstring Stretch  - 1 x daily - 7 x weekly - 2 reps - 20 hold - Standing March with Counter Support  - 1 x daily - 7 x weekly - 2 sets - 10 reps - Standing Hip Abduction with Unilateral Counter Support  - 1 x daily - 7 x weekly - 2 sets - 10 reps - Standing Hip Extension with Unilateral Counter Support  - 1 x daily - 7 x weekly - 2 sets - 10 reps  ASSESSMENT:  CLINICAL IMPRESSION: Freddi continues to have pain which impedes her ability to progress to more effective core work.  We discussed pain control using anti inflammatories or Tylenol along with ice.   She is well motivated and compliant.  She was able to do several core exercises but did have some discomfort with these.  She would benefit from continuing skilled PT for LE flexibility and core strengthening.     OBJECTIVE IMPAIRMENTS: Abnormal gait, decreased activity tolerance, decreased balance, decreased coordination, decreased endurance, decreased mobility, difficulty walking, decreased ROM, decreased strength, impaired flexibility, improper body mechanics, postural dysfunction, obesity, and pain.   ACTIVITY LIMITATIONS: carrying, lifting, bending, sitting, standing, squatting, and locomotion level  PARTICIPATION LIMITATIONS: cleaning, laundry, community activity, and  occupation  PERSONAL FACTORS: Age, Fitness, Profession, and 3+ comorbidities: OA, Bilat TKE, DM, A-Fib  are also affecting patient's functional outcome.   REHAB POTENTIAL: Good  CLINICAL DECISION MAKING: Evolving/moderate complexity  EVALUATION COMPLEXITY: Moderate   GOALS: Goals reviewed with patient? Yes  SHORT TERM GOALS: Target date: 10/14/2022  Patient will demonstrate competence with basic HEP. Baseline: Goal status: INITIAL  2.  Patient will perform a 6 MWT test to receive a baseline measurement to assess aerobic endurance and to set prolonged walking goals.  Baseline: patient shared they avoid prolonged walking due to pain  Goal status: INITIAL  3.  Patient will demonstrate proper technique when lifting light weight (5-10#) objects off of the floor without increased pain.  Baseline: MO[place] at airport  Goal status: INITIAL  4.  Patient will demonstrate proper log roll technique in and out bed to avoid increased pain. Baseline: transferring was difficult  Goal status: INITIAL   LONG TERM GOALS: Target date: 11/18/2022  Patient will perform advanced HEP independently upon discharge.  Baseline:  Goal status: INITIAL  2.  Patient will decrease TUG time to no greater than 10 sec in  order to decrease the risk of future falls.  Baseline: 12.84 sec Goal status: INITIAL  3.  Patient will improve their FOTO score to 56% in order to participate in ADLs and IADLs without increased pain.  Baseline: 40% Goal status: INITIAL  4.  Patient will be able to walk for 20 minutes without increased pain for community activity, such as grocery shopping.  Baseline: patient shared that they have trouble walking to and from the bathroom at work  Goal status: INITIAL  5.  Patient will increase global LE strength to at least 4+ to 5-/5 required for getting on and off chair at work.  Baseline: 4/5, patient reported fear of falling  Goal status: INITIAL   PLAN:  PT FREQUENCY: 1-2x/week  PT DURATION: 8 weeks  PLANNED INTERVENTIONS: Therapeutic exercises, Therapeutic activity, Neuromuscular re-education, Balance training, Gait training, Patient/Family education, Self Care, Joint mobilization, Stair training, Aquatic Therapy, Dry Needling, Electrical stimulation, Spinal manipulation, Spinal mobilization, Cryotherapy, Moist heat, Taping, Traction, Ultrasound, Ionotophoresis 4mg /ml Dexamethasone, Manual therapy, and Re-evaluation.  PLAN FOR NEXT SESSION: Aquatic and land therapy for LE flexibility and core strengthening.    Victorino Dike B. Matilynn Dacey, PT 09/29/22 8:45 AM  [facility] [address] [place], [place] [address] Phone # [phone] Fax [phone]  09/29/2022, 8:45 AM

## 2022-09-30 ENCOUNTER — Other Ambulatory Visit (HOSPITAL_COMMUNITY): Payer: Self-pay

## 2022-10-03 DIAGNOSIS — H3561 Retinal hemorrhage, right eye: Secondary | ICD-10-CM | POA: Diagnosis not present

## 2022-10-03 DIAGNOSIS — H353121 Nonexudative age-related macular degeneration, left eye, early dry stage: Secondary | ICD-10-CM | POA: Diagnosis not present

## 2022-10-03 DIAGNOSIS — Z961 Presence of intraocular lens: Secondary | ICD-10-CM | POA: Diagnosis not present

## 2022-10-03 DIAGNOSIS — E113513 Type 2 diabetes mellitus with proliferative diabetic retinopathy with macular edema, bilateral: Secondary | ICD-10-CM | POA: Diagnosis not present

## 2022-10-03 DIAGNOSIS — H3582 Retinal ischemia: Secondary | ICD-10-CM | POA: Diagnosis not present

## 2022-10-03 DIAGNOSIS — H353112 Nonexudative age-related macular degeneration, right eye, intermediate dry stage: Secondary | ICD-10-CM | POA: Diagnosis not present

## 2022-10-03 DIAGNOSIS — H31093 Other chorioretinal scars, bilateral: Secondary | ICD-10-CM | POA: Diagnosis not present

## 2022-10-04 ENCOUNTER — Other Ambulatory Visit (HOSPITAL_COMMUNITY): Payer: Self-pay

## 2022-10-04 ENCOUNTER — Other Ambulatory Visit: Payer: Self-pay

## 2022-10-04 ENCOUNTER — Ambulatory Visit (HOSPITAL_BASED_OUTPATIENT_CLINIC_OR_DEPARTMENT_OTHER): Payer: 59 | Attending: Family Medicine | Admitting: Physical Therapy

## 2022-10-04 ENCOUNTER — Encounter (HOSPITAL_BASED_OUTPATIENT_CLINIC_OR_DEPARTMENT_OTHER): Payer: Self-pay | Admitting: Physical Therapy

## 2022-10-04 DIAGNOSIS — M6281 Muscle weakness (generalized): Secondary | ICD-10-CM | POA: Diagnosis not present

## 2022-10-04 DIAGNOSIS — R252 Cramp and spasm: Secondary | ICD-10-CM | POA: Diagnosis not present

## 2022-10-04 DIAGNOSIS — M5459 Other low back pain: Secondary | ICD-10-CM | POA: Diagnosis not present

## 2022-10-04 DIAGNOSIS — R262 Difficulty in walking, not elsewhere classified: Secondary | ICD-10-CM | POA: Insufficient documentation

## 2022-10-04 NOTE — Therapy (Signed)
OUTPATIENT PHYSICAL THERAPY THORACOLUMBAR Treatment Note   Patient Name: [name] MRN: [id number] DOB:Sep 27, 1950, 72 y.o., female Today's Date: 10/04/2022  END OF SESSION:  PT End of Session - 10/04/22 1611     Visit Number 4    Date for PT Re-Evaluation 11/18/22    Authorization Type [employer]    Progress Note Due on Visit 10    PT Start Time 1612    PT Stop Time 1650    PT Time Calculation (min) 38 min    Activity Tolerance Patient tolerated treatment well    Behavior During Therapy WFL for tasks assessed/performed             Past Medical History:  Diagnosis Date   A-fib (HCC)    Anemia    hx of   Arthritis    Chest pain    Constipation    Depression    Depression    Diabetes mellitus ORAL MEDS   Diabetic retinopathy (HCC)    Diabetic retinopathy (HCC)    Dyspnea    with minimal exertion-deconditioned   Dyspnea    Dysrhythmia    a-fib   Food allergy    GERD (gastroesophageal reflux disease)    occasional   Gout    Heartburn    History of kidney stones    Hypercholesteremia    Hyperlipidemia    Hypertension    Hypertensive kidney disease    Hypothyroidism    Joint pain    Kidney problem    Knee pain, right    Left arm numbness DUE TO CERVICAL PINCHED NERVE   Obesity    OSA on CPAP    uses CPAP nightly   Osteoarthritis    Osteoarthritis    Palpitations    Pinched nerve in neck    Pleurisy    Pneumonia    hx of   PONV (postoperative nausea and vomiting)    for 3-4 days after general anesthesia approx 10-12 years ago   Sciatica    Shortness of breath    Sleep apnea    Spinal stenosis    Swelling of both lower extremities    Swelling of knee joint, right    Synovitis of knee RIGHT   Trigger finger, left    left index   Vitamin D deficiency    Past Surgical History:  Procedure Laterality Date   CESAREAN SECTION  X3   KNEE ARTHROSCOPY  04/20/2011   Procedure: ARTHROSCOPY KNEE;  Surgeon: Loanne Drilling, MD;  Location: [facility] [place];  Service: Orthopedics;  Laterality: Right;  WITH SYNOVECTOMY   LEFT CARPAL TUNNEL / LEFT MIDDLE & RING FINGER TRIGGER RELEASE  08-26-2008   LEFT SHOULDER ARTHROSCOPY W/ DEBRIDEMENT  09-09-2003   LEFT SHOULDER ARTHROSCOPY/ LEFT THUMB TRIGGER RELEASE  02-22-2005   PHOTOCOAGULATION WITH LASER Right 03/20/2018   Procedure: PHOTOCOAGULATION WITH LASER;  Surgeon: Carmela Rima, MD;  Location: [facility] OR;  Service: Ophthalmology;  Laterality: Right;   PULLEY RELEASE LEFT LONG FINGER  07-14-2009   RADIAL HEAD ARTHROPLASTY Right 06/17/2018   Procedure: RADIAL HEAD ARTHROPLASTY;  Surgeon: Bjorn Pippin, MD;  Location: [facility] OR;  Service: Orthopedics;  Laterality: Right;   RIGHT CARPAL TUNNEL/ RIGHT THUMB TRIGGER RELEASE'S  11-28-2006   RIGHT SHOULDER ARTHROSCOPY W/ ROTATOR CUFF REPAIR  01-13-2004   SHOULDER ARTHROSCOPY DISTAL CLAVICLE EXCISION AND OPEN ROTATOR CUFF REPAIR  09-07-2004   LEFT   SHOULDER ARTHROSCOPY W/ ACROMIAL REPAIR  11-29-2005   LEFT  SYNOVECTOMY Left 06/20/2022   Procedure: left index finger and left long finger metacarpal phalangel joint syonvectomy;  Surgeon: Dairl Ponder, MD;  Location: [facility] OR;  Service: Orthopedics;  Laterality: Left;  needs 1 hour of time..   TONSILLECTOMY AND ADENOIDECTOMY  child   TOTAL KNEE ARTHROPLASTY  12-14-2009   RIGHT   TOTAL KNEE ARTHROPLASTY Left 11/05/2012   Procedure: LEFT TOTAL KNEE ARTHROPLASTY;  Surgeon: Loanne Drilling, MD;  Location: [facility];  Service: Orthopedics;  Laterality: Left;   TOTAL KNEE REVISION Right 07/29/2016   Procedure: RIGHT KNEE POLY-LINER EXCHANGE;  Surgeon: Kathryne Hitch, MD;  Location: [facility];  Service: Orthopedics;  Laterality: Right;   TRIGGER FINGER RELEASE Right 02/21/2013   Procedure: RIGHT RING A-1 PULLEY RELEASE    (MINOR PROCEDURE) ;  Surgeon: Wyn Forster., MD;  Location: [facility];  Service: Orthopedics;  Laterality: Right;   TRIGGER FINGER RELEASE Left 09/22/2016    Procedure: RELEASE TRIGGER FINGER LEFT INDEX FINGER;  Surgeon: Kathryne Hitch, MD;  Location: [facility] OR;  Service: Orthopedics;  Laterality: Left;   TRIGGER FINGER RELEASE Right 01/20/2017   Procedure: RELEASE TRIGGER FINGER/A-1 PULLEY RIGHT INDEX FINGER;  Surgeon: Marcene Corning, MD;  Location: [facility];  Service: Orthopedics;  Laterality: Right;   VITRECTOMY 25 GAUGE WITH SCLERAL BUCKLE Right 03/20/2018   Procedure: RIGHT EYE VITRECTOMY WITH  ENDOLASER PARENTAL PHOTOCOAGULATION 25 GAUGE;  Surgeon: Carmela Rima, MD;  Location: [facility] OR;  Service: Ophthalmology;  Laterality: Right;   WRIST ARTHROSCOPY WITH DEBRIDEMENT Right 05/01/2019   Procedure: WRIST ARTHROSCOPY WITH DEBRIDEMENT;  Surgeon: Dairl Ponder, MD;  Location:  SURGERY CENTER;  Service: Orthopedics;  Laterality: Right;   Patient Active Problem List   Diagnosis Date Noted   Notalgia 08/03/2022   BMI 45.0-49.9, adult (HCC) 06/13/2022   Other secondary hypertension 06/13/2022   SOBOE (shortness of breath on exertion) 05/30/2022   Depression screening 05/30/2022   Morbid obesity (HCC) 05/30/2022   Class 3 severe obesity with serious comorbidity and body mass index (BMI) of 50.0 to 59.9 in adult [facility]) 05/30/2022   Paroxysmal atrial fibrillation (HCC) 10/22/2021   Hypercoagulable state due to paroxysmal atrial fibrillation (HCC) 10/22/2021   Radial head fracture, closed 06/16/2018   Closed fracture dislocation of elbow 06/16/2018   Other fatigue 07/18/2017   Shortness of breath on exertion 07/18/2017   Type 2 diabetes mellitus with retinopathy, with long-term current use of insulin (HCC) 07/18/2017   Vitamin D deficiency 07/18/2017   B12 nutritional deficiency 07/18/2017   Other specified hypothyroidism 07/18/2017   Acute pain of right knee 12/22/2016   Trigger index finger of left hand 09/22/2016   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 requiring isolated polyethylene liner  exchange (HCC) 07/29/2016   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) 04/14/2016   Diabetes (HCC) 01/08/2014   Essential hypertension 01/08/2014   Hyperlipidemia 01/08/2014   Postoperative anemia due to acute blood loss 11/22/2012   Hyponatremia 11/06/2012   OA (osteoarthritis) of knee 11/05/2012   Villonodular synovitis of knee 04/20/2011    PCP: Thana Ates, MD  REFERRING PROVIDER: Rodolph Bong, MD   REFERRING DIAG: M54.42,M54.41,G89.29 (ICD-10-CM) - Chronic bilateral low back pain with bilateral sciatica   Rationale for Evaluation and Treatment: Rehabilitation  THERAPY DIAG:  Other low back pain  Cramp and spasm  Muscle weakness (generalized)  Difficulty in walking, not elsewhere classified  ONSET DATE: "a couple of years", but started worsening  in May 2024  SUBJECTIVE:                                                                                                                                                                                           SUBJECTIVE STATEMENT: Patient reports she felt good while she was in the water, it felt good, but an hour later her pain returned.  "Pain depends on what I'm doing.. "  PERTINENT HISTORY:  OA, A-Fib, DM, bilateral TKA, bilateral shoulder surgeries  PAIN: 09/29/22 Are you having pain? Yes: NPRS scale: 3/10 Pain location: low back Pain description: pain radiates from buttocks down bilateral LE past knees (Left more involved than Right) Aggravating factors: coughing, prolonged sitting and standing  Relieving factors: injection , sleep, weight shifting    PRECAUTIONS: None  RED FLAGS: None   WEIGHT BEARING RESTRICTIONS: No  FALLS:  Has patient fallen in last 6 months? No  LIVING ENVIRONMENT: Lives with: lives alone Lives in: House/apartment Stairs: No Has following equipment at home: Single point cane  OCCUPATION: Works in admissions in [employer]  PLOF: Independent and Leisure:  crafting  PATIENT GOALS: To be able to walk without hunching over and less pain.  NEXT MD VISIT: as needed  OBJECTIVE:   DIAGNOSTIC FINDINGS:  Lumbar MRI on 08/28/2022: IMPRESSION: 1. Lumbar spine spondylosis as described above. 2. No acute osseous injury of the lumbar spine.  Lumbar Radiograph on 08/09/2022: IMPRESSION: Multilevel degenerative changes of the lumbar spine as discussed above.  PATIENT SURVEYS:  Eval:  FOTO 40 (projected 56 by visit 12)   COGNITION: Overall cognitive status: Within functional limits for tasks assessed     SENSATION: WFL  MUSCLE LENGTH: Hamstrings: Tightness bilaterally   POSTURE: rounded shoulders, forward head, increased lumbar lordosis, increased thoracic kyphosis, and posterior pelvic tilt   LUMBAR ROM:   Some decreased motion with pain  LOWER EXTREMITY ROM:     WFL  LOWER EXTREMITY MMT:    Eval:  Bilateral LE strength grossly 4/5 throughout with some pain noted  LUMBAR SPECIAL TESTS:  Slump test: Positive  FUNCTIONAL TESTS:  09/22/2022: 5 times sit to stand: 10.15 sec Timed up and go (TUG): 12.84 sec  GAIT: Distance walked: 200 ft Assistive device utilized: None Level of assistance: Complete Independence Comments: Pt with antalgic gait  TODAY'S TREATMENT:    10/04/22: Patient seen for aquatic therapy today.  Treatment took place in water 2.5-4 feet deep depending upon activity.  Pt entered the pool via stairs with moderate use of railings, caution used. Pt requires buoyancy of water for support and to offload joints with strengthening exercises.    *  walking forward/ backward - then with added row motion with rainbow hand floats * side stepping with arm addct with rainbow hand floats -> 2nd lap with mini side squat   * relaxed squat at wall; L stretch with wag the tail * holding wall: hip openers (hip abdct/knee flex); hip crosses (hip addct/knee flex); single leg clams ,alternating LEs;  hip circles "draw circle on  ground with toes";  L stretch with feet on wall  * return to walking forward/ backward with reciprocal arm swing * TrA set with short blue noodle pull down to thighs with 2 isometric holds on return to surface * straddling noodle:  cycling with gentle breast stroke arms   09/29/22: Nustep x 5 min level 3 (PT present to discuss status) Standing hamstring stretch 3 x 30 sec each LE (in room 13 with table lowered to lowest position) Standing quad stretch 3 x 30 sec each LE (in room 13 with table lowered to lowest position) Supine hook lying PPT x 20 Lower trunk rotation x 20 PPT with march x 20 PPT with alternating arms and legs x 20 PPT with clam x 20 with blue loop  Sidelying clam  09/28/22: Patient seen for aquatic therapy today.  Treatment took place in water 2.5-4 feet deep depending upon activity.  Pt entered the pool via stairs with moderate use of railings, caution used. Pt requires buoyancy of water for support and to offload joints with strengthening exercises.  Seated water bench with 75% submersion Pt performed seated LE AROM exercises 20x in all planes with concurrent discussion of current status, pain, [facility] and lastly water principles and how she can better utilize them. Pt verbally understood all principles and concepts. 75% water depth for water walking 6 lengths with UE single buoy weights for arm movements. Same weights for arm exercises with focus on core stabilization including: arm abd/add 10x, horizontal abd/add 10x, and shoulder ext/flexion 10x. Bicep and tricep curls with mitten hands 2x10. Standing core /lat press with single buoy UE wt 10x with VC to contract her glutes.                                                                                                                            DATE: 09/22/2022  Review of HEP:  hamstring stretch, marching, hip abduction, hip extension Seated blue pball rollout 5x5 sec  PATIENT EDUCATION:   Education details: aquatic therapy modifications  Person educated: Patient Education method: Medical illustrator and Handouts Education comprehension: verbalized understanding  HOME EXERCISE PROGRAM: Access Code: [email or web address] URL: [email or web address] Date: 09/22/2022 Prepared by: Clydie Braun Menke  Exercises - Seated Hamstring Stretch  - 1 x daily - 7 x weekly - 2 reps - 20 hold - Standing March with Counter Support  - 1 x daily - 7 x weekly - 2 sets - 10 reps - Standing Hip Abduction with Unilateral Counter Support  - 1 x daily -  7 x weekly - 2 sets - 10 reps - Standing Hip Extension with Unilateral Counter Support  - 1 x daily - 7 x weekly - 2 sets - 10 reps  ASSESSMENT:  CLINICAL IMPRESSION: Pt currently goes to [facility] and swims back stroke and side stroke, and walks in the water about 2x/wk. Discussed how we will create an aquatic HEP to supplement her current exercises, and find exercises that don't exacerbate her pain. Pain increased today with side stepping but subsided with rest as well as with suspended cycling.   She would benefit from continuing skilled PT for LE flexibility and core strengthening.  Goals are ongoing.    OBJECTIVE IMPAIRMENTS: Abnormal gait, decreased activity tolerance, decreased balance, decreased coordination, decreased endurance, decreased mobility, difficulty walking, decreased ROM, decreased strength, impaired flexibility, improper body mechanics, postural dysfunction, obesity, and pain.   ACTIVITY LIMITATIONS: carrying, lifting, bending, sitting, standing, squatting, and locomotion level  PARTICIPATION LIMITATIONS: cleaning, laundry, community activity, and occupation  PERSONAL FACTORS: Age, Fitness, Profession, and 3+ comorbidities: OA, Bilat TKE, DM, A-Fib  are also affecting patient's functional outcome.   REHAB POTENTIAL: Good  CLINICAL DECISION MAKING: Evolving/moderate complexity  EVALUATION COMPLEXITY:  Moderate   GOALS: Goals reviewed with patient? Yes  SHORT TERM GOALS: Target date: 10/14/2022  Patient will demonstrate competence with basic HEP. Baseline: Goal status: INITIAL  2.  Patient will perform a 6 MWT test to receive a baseline measurement to assess aerobic endurance and to set prolonged walking goals.  Baseline: patient shared they avoid prolonged walking due to pain  Goal status: INITIAL  3.  Patient will demonstrate proper technique when lifting light weight (5-10#) objects off of the floor without increased pain.  Baseline: MOI [place] at airport  Goal status: INITIAL  4.  Patient will demonstrate proper log roll technique in and out bed to avoid increased pain. Baseline: transferring was difficult  Goal status: INITIAL   LONG TERM GOALS: Target date: 11/18/2022  Patient will perform advanced HEP independently upon discharge.  Baseline:  Goal status: INITIAL  2.  Patient will decrease TUG time to no greater than 10 sec in order to decrease the risk of future falls.  Baseline: 12.84 sec Goal status: INITIAL  3.  Patient will improve their FOTO score to 56% in order to participate in ADLs and IADLs without increased pain.  Baseline: 40% Goal status: INITIAL  4.  Patient will be able to walk for 20 minutes without increased pain for community activity, such as grocery shopping.  Baseline: patient shared that they have trouble walking to and from the bathroom at work  Goal status: INITIAL  5.  Patient will increase global LE strength to at least 4+ to 5-/5 required for getting on and off chair at work.  Baseline: 4/5, patient reported fear of falling  Goal status: INITIAL   PLAN:  PT FREQUENCY: 1-2x/week  PT DURATION: 8 weeks  PLANNED INTERVENTIONS: Therapeutic exercises, Therapeutic activity, Neuromuscular re-education, Balance training, Gait training, Patient/Family education, Self Care, Joint mobilization, Stair training, Aquatic Therapy,  Dry Needling, Electrical stimulation, Spinal manipulation, Spinal mobilization, Cryotherapy, Moist heat, Taping, Traction, Ultrasound, Ionotophoresis 4mg /ml Dexamethasone, Manual therapy, and Re-evaluation.  PLAN FOR NEXT SESSION: Aquatic and land therapy for LE flexibility and core strengthening.    Mayer Camel, PTA 10/04/22 5:05 PM [facility] [address] [place], [place], [address] Phone: [phone]   Fax:  [phone]

## 2022-10-05 DIAGNOSIS — E113512 Type 2 diabetes mellitus with proliferative diabetic retinopathy with macular edema, left eye: Secondary | ICD-10-CM | POA: Diagnosis not present

## 2022-10-06 ENCOUNTER — Other Ambulatory Visit (HOSPITAL_COMMUNITY): Payer: Self-pay

## 2022-10-06 ENCOUNTER — Encounter (INDEPENDENT_AMBULATORY_CARE_PROVIDER_SITE_OTHER): Payer: Self-pay | Admitting: Family Medicine

## 2022-10-06 ENCOUNTER — Ambulatory Visit (INDEPENDENT_AMBULATORY_CARE_PROVIDER_SITE_OTHER): Payer: 59 | Admitting: Family Medicine

## 2022-10-06 VITALS — BP 134/70 | HR 71 | Temp 97.8°F | Ht 60.0 in | Wt 253.0 lb

## 2022-10-06 DIAGNOSIS — Z6841 Body Mass Index (BMI) 40.0 and over, adult: Secondary | ICD-10-CM | POA: Diagnosis not present

## 2022-10-06 DIAGNOSIS — I10 Essential (primary) hypertension: Secondary | ICD-10-CM

## 2022-10-06 DIAGNOSIS — E669 Obesity, unspecified: Secondary | ICD-10-CM | POA: Diagnosis not present

## 2022-10-10 ENCOUNTER — Other Ambulatory Visit: Payer: Self-pay | Admitting: Cardiology

## 2022-10-10 ENCOUNTER — Other Ambulatory Visit (HOSPITAL_COMMUNITY): Payer: Self-pay

## 2022-10-10 ENCOUNTER — Ambulatory Visit: Payer: 59 | Admitting: Physical Therapy

## 2022-10-10 ENCOUNTER — Other Ambulatory Visit: Payer: Self-pay | Admitting: Cardiovascular Disease

## 2022-10-10 DIAGNOSIS — R2689 Other abnormalities of gait and mobility: Secondary | ICD-10-CM | POA: Diagnosis not present

## 2022-10-10 DIAGNOSIS — M5459 Other low back pain: Secondary | ICD-10-CM | POA: Diagnosis not present

## 2022-10-10 DIAGNOSIS — R252 Cramp and spasm: Secondary | ICD-10-CM

## 2022-10-10 DIAGNOSIS — M6281 Muscle weakness (generalized): Secondary | ICD-10-CM | POA: Diagnosis not present

## 2022-10-10 DIAGNOSIS — G4733 Obstructive sleep apnea (adult) (pediatric): Secondary | ICD-10-CM | POA: Diagnosis not present

## 2022-10-10 DIAGNOSIS — R293 Abnormal posture: Secondary | ICD-10-CM | POA: Diagnosis not present

## 2022-10-10 DIAGNOSIS — I480 Paroxysmal atrial fibrillation: Secondary | ICD-10-CM

## 2022-10-10 DIAGNOSIS — M5442 Lumbago with sciatica, left side: Secondary | ICD-10-CM | POA: Diagnosis not present

## 2022-10-10 DIAGNOSIS — R262 Difficulty in walking, not elsewhere classified: Secondary | ICD-10-CM

## 2022-10-10 DIAGNOSIS — M5441 Lumbago with sciatica, right side: Secondary | ICD-10-CM | POA: Diagnosis not present

## 2022-10-10 DIAGNOSIS — G8929 Other chronic pain: Secondary | ICD-10-CM | POA: Diagnosis not present

## 2022-10-10 MED ORDER — RIVAROXABAN 20 MG PO TABS
20.0000 mg | ORAL_TABLET | Freq: Every day | ORAL | 1 refills | Status: DC
Start: 2022-10-10 — End: 2023-04-07
  Filled 2022-10-10: qty 90, 90d supply, fill #0
  Filled 2023-01-09: qty 90, 90d supply, fill #1

## 2022-10-10 MED ORDER — FLECAINIDE ACETATE 100 MG PO TABS
100.0000 mg | ORAL_TABLET | Freq: Two times a day (BID) | ORAL | 1 refills | Status: DC
  Filled 2022-10-10: qty 180, 90d supply, fill #0
  Filled 2023-01-09: qty 180, 90d supply, fill #1

## 2022-10-10 NOTE — Progress Notes (Signed)
Chief Complaint:   OBESITY [name] is here to discuss her progress with her obesity treatment plan along with follow-up of her obesity related diagnoses. [name] is on the Category 2 Plan and states she is following her eating plan approximately 80% of the time. [name] states she is swimming for 60 minutes 2 times per week.  Today's visit was #: 6 Starting weight: 258 lbs Starting date: 05/30/2022 Today's weight: 253 lbs Today's date: 10/06/2022 Total lbs lost to date: 5 Total lbs lost since last in-office visit: 0  Interim History: Patient is struggling with meal planning recently.  She cooks for herself and she often does more grab and go at night.  Subjective:   1. Essential hypertension Patient's blood pressure is well-controlled with her diet and exercise, as well as with her current medications.  Assessment/Plan:   1. Essential hypertension Patient's goal is to continue with her diet, exercise, and weight loss and reduce the need for antihypertensive medications.  We will continue to monitor closely.  2. BMI 45.0-49.9, adult (HCC)  3. Obesity, Beginning BMI 50.39 [name] is currently in the action stage of change. As such, her goal is to continue with weight loss efforts. She has agreed to the Category 2 Plan.   Exercise goals: As is.   Behavioral modification strategies: decreasing simple carbohydrates.  [name] has agreed to follow-up with our clinic in 4 weeks. She was informed of the importance of frequent follow-up visits to maximize her success with intensive lifestyle modifications for her multiple health conditions.   Objective:   Blood pressure 134/70, pulse 71, temperature 97.8 F (36.6 C), height 5' (1.524 m), weight 253 lb (114.8 kg), SpO2 93%. Body mass index is 49.41 kg/m.  Lab Results  Component Value Date   CREATININE 1.27 (H) 05/30/2022   BUN 34 (H) 05/30/2022   NA 139 05/30/2022   K 4.9 05/30/2022   CL 103 05/30/2022   CO2 17 (L) 05/30/2022    Lab Results  Component Value Date   ALT 13 05/30/2022   AST 19 05/30/2022   ALKPHOS 87 05/30/2022   BILITOT <0.2 05/30/2022   Lab Results  Component Value Date   HGBA1C 7.7 (H) 05/30/2022   HGBA1C 7.4 (H) 11/28/2017   HGBA1C 6.8 (H) 07/18/2017   HGBA1C 7.7 (H) 07/20/2016   HGBA1C 7.9 09/18/2014   Lab Results  Component Value Date   INSULIN 24.3 05/30/2022   Lab Results  Component Value Date   TSH 0.790 05/30/2022   Lab Results  Component Value Date   CHOL 111 05/30/2022   HDL 35 (L) 05/30/2022   LDLCALC 55 05/30/2022   TRIG 114 05/30/2022   Lab Results  Component Value Date   VD25OH 34.8 05/30/2022   VD25OH 31.4 11/28/2017   VD25OH 30.5 07/18/2017   Lab Results  Component Value Date   WBC 10.0 05/30/2022   HGB 12.4 05/30/2022   HCT 40.0 05/30/2022   MCV 88 05/30/2022   PLT 378 05/30/2022   No results found for: "IRON", "TIBC", "FERRITIN"  Attestation Statements:   Reviewed by clinician on day of visit: allergies, medications, problem list, medical history, surgical history, family history, social history, and previous encounter notes.  Time spent on visit including pre-visit chart review and post-visit care and charting was 25 minutes.   I, Burt Knack, am acting as transcriptionist for Quillian Quince, MD.  I have reviewed the above documentation for accuracy and completeness, and I agree with the above. -  Quillian Quince, MD

## 2022-10-10 NOTE — Telephone Encounter (Signed)
Prescription refill request for Xarelto received.  Indication: Afib  Last office visit: 04/13/22 [name])  Weight: 114.8kg Age: 72 Scr: 1.27 (05/30/22)  CrCl: 71.22ml/min  Appropriate dose. Refill sent.

## 2022-10-10 NOTE — Therapy (Signed)
OUTPATIENT PHYSICAL THERAPY THORACOLUMBAR Treatment Note   Patient Name: [name] MRN: [id number] DOB:[birth year], 72 y.o., female Today's Date: 10/10/2022  END OF SESSION:  PT End of Session - 10/10/22 1450     Visit Number 5    Date for PT Re-Evaluation 11/18/22    Authorization Type [employer]    Progress Note Due on Visit 10    PT Start Time 1447    PT Stop Time 1530    PT Time Calculation (min) 43 min    Activity Tolerance Patient tolerated treatment well    Behavior During Therapy WFL for tasks assessed/performed              Past Medical History:  Diagnosis Date   A-fib (HCC)    Anemia    hx of   Arthritis    Chest pain    Constipation    Depression    Depression    Diabetes mellitus ORAL MEDS   Diabetic retinopathy (HCC)    Diabetic retinopathy (HCC)    Dyspnea    with minimal exertion-deconditioned   Dyspnea    Dysrhythmia    a-fib   Food allergy    GERD (gastroesophageal reflux disease)    occasional   Gout    Heartburn    History of kidney stones    Hypercholesteremia    Hyperlipidemia    Hypertension    Hypertensive kidney disease    Hypothyroidism    Joint pain    Kidney problem    Knee pain, right    Left arm numbness DUE TO CERVICAL PINCHED NERVE   Obesity    OSA on CPAP    uses CPAP nightly   Osteoarthritis    Osteoarthritis    Palpitations    Pinched nerve in neck    Pleurisy    Pneumonia    hx of   PONV (postoperative nausea and vomiting)    for 3-4 days after general anesthesia approx 10-12 years ago   Sciatica    Shortness of breath    Sleep apnea    Spinal stenosis    Swelling of both lower extremities    Swelling of knee joint, right    Synovitis of knee RIGHT   Trigger finger, left    left index   Vitamin D deficiency    Past Surgical History:  Procedure Laterality Date   CESAREAN SECTION  X3   KNEE ARTHROSCOPY  04/20/2011   Procedure: ARTHROSCOPY KNEE;  Surgeon: Loanne Drilling, MD;  Location: [facility]  [place];  Service: Orthopedics;  Laterality: Right;  WITH SYNOVECTOMY   LEFT CARPAL TUNNEL / LEFT MIDDLE & RING FINGER TRIGGER RELEASE  08-26-2008   LEFT SHOULDER ARTHROSCOPY W/ DEBRIDEMENT  09-09-2003   LEFT SHOULDER ARTHROSCOPY/ LEFT THUMB TRIGGER RELEASE  02-22-2005   PHOTOCOAGULATION WITH LASER Right 03/20/2018   Procedure: PHOTOCOAGULATION WITH LASER;  Surgeon: Carmela Rima, MD;  Location: [facility] OR;  Service: Ophthalmology;  Laterality: Right;   PULLEY RELEASE LEFT LONG FINGER  07-14-2009   RADIAL HEAD ARTHROPLASTY Right 06/17/2018   Procedure: RADIAL HEAD ARTHROPLASTY;  Surgeon: Bjorn Pippin, MD;  Location: [facility] OR;  Service: Orthopedics;  Laterality: Right;   RIGHT CARPAL TUNNEL/ RIGHT THUMB TRIGGER RELEASE'S  11-28-2006   RIGHT SHOULDER ARTHROSCOPY W/ ROTATOR CUFF REPAIR  01-13-2004   SHOULDER ARTHROSCOPY DISTAL CLAVICLE EXCISION AND OPEN ROTATOR CUFF REPAIR  09-07-2004   LEFT   SHOULDER ARTHROSCOPY W/ ACROMIAL REPAIR  11-29-2005   LEFT  SYNOVECTOMY Left 06/20/2022   Procedure: left index finger and left long finger metacarpal phalangel joint syonvectomy;  Surgeon: Dairl Ponder, MD;  Location: [facility] OR;  Service: Orthopedics;  Laterality: Left;  needs 1 hour of time..   TONSILLECTOMY AND ADENOIDECTOMY  child   TOTAL KNEE ARTHROPLASTY  12-14-2009   RIGHT   TOTAL KNEE ARTHROPLASTY Left 11/05/2012   Procedure: LEFT TOTAL KNEE ARTHROPLASTY;  Surgeon: Loanne Drilling, MD;  Location: [facility];  Service: Orthopedics;  Laterality: Left;   TOTAL KNEE REVISION Right 07/29/2016   Procedure: RIGHT KNEE POLY-LINER EXCHANGE;  Surgeon: Kathryne Hitch, MD;  Location: [facility];  Service: Orthopedics;  Laterality: Right;   TRIGGER FINGER RELEASE Right 02/21/2013   Procedure: RIGHT RING A-1 PULLEY RELEASE    (MINOR PROCEDURE) ;  Surgeon: Wyn Forster., MD;  Location: [facility];  Service: Orthopedics;  Laterality: Right;   TRIGGER FINGER RELEASE Left 09/22/2016    Procedure: RELEASE TRIGGER FINGER LEFT INDEX FINGER;  Surgeon: Kathryne Hitch, MD;  Location: [facility] OR;  Service: Orthopedics;  Laterality: Left;   TRIGGER FINGER RELEASE Right 01/20/2017   Procedure: RELEASE TRIGGER FINGER/A-1 PULLEY RIGHT INDEX FINGER;  Surgeon: Marcene Corning, MD;  Location: [facility];  Service: Orthopedics;  Laterality: Right;   VITRECTOMY 25 GAUGE WITH SCLERAL BUCKLE Right 03/20/2018   Procedure: RIGHT EYE VITRECTOMY WITH  ENDOLASER PARENTAL PHOTOCOAGULATION 25 GAUGE;  Surgeon: Carmela Rima, MD;  Location: [facility] OR;  Service: Ophthalmology;  Laterality: Right;   WRIST ARTHROSCOPY WITH DEBRIDEMENT Right 05/01/2019   Procedure: WRIST ARTHROSCOPY WITH DEBRIDEMENT;  Surgeon: Dairl Ponder, MD;  Location: [facility];  Service: Orthopedics;  Laterality: Right;   Patient Active Problem List   Diagnosis Date Noted   Notalgia 08/03/2022   BMI 45.0-49.9, adult (HCC) 06/13/2022   Other secondary hypertension 06/13/2022   SOBOE (shortness of breath on exertion) 05/30/2022   Depression screening 05/30/2022   Morbid obesity (HCC) 05/30/2022   Class 3 severe obesity with serious comorbidity and body mass index (BMI) of 50.0 to 59.9 in adult [facility]) 05/30/2022   Paroxysmal atrial fibrillation (HCC) 10/22/2021   Hypercoagulable state due to paroxysmal atrial fibrillation (HCC) 10/22/2021   Radial head fracture, closed 06/16/2018   Closed fracture dislocation of elbow 06/16/2018   Other fatigue 07/18/2017   Shortness of breath on exertion 07/18/2017   Type 2 diabetes mellitus with retinopathy, with long-term current use of insulin (HCC) 07/18/2017   Vitamin D deficiency 07/18/2017   B12 nutritional deficiency 07/18/2017   Other specified hypothyroidism 07/18/2017   Acute pain of right knee 12/22/2016   Trigger index finger of left hand 09/22/2016   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 requiring isolated polyethylene liner  exchange (HCC) 07/29/2016   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) 04/14/2016   Diabetes (HCC) 01/08/2014   Essential hypertension 01/08/2014   Hyperlipidemia 01/08/2014   Postoperative anemia due to acute blood loss 11/22/2012   Hyponatremia 11/06/2012   OA (osteoarthritis) of knee 11/05/2012   Villonodular synovitis of knee 04/20/2011    PCP: Thana Ates, MD  REFERRING PROVIDER: Rodolph Bong, MD   REFERRING DIAG: M54.42,M54.41,G89.29 (ICD-10-CM) - Chronic bilateral low back pain with bilateral sciatica   Rationale for Evaluation and Treatment: Rehabilitation  THERAPY DIAG:  Other low back pain  Cramp and spasm  Muscle weakness (generalized)  Difficulty in walking, not elsewhere classified  ONSET DATE: "a couple of years", but started worsening  in May 2024  SUBJECTIVE:                                                                                                                                                                                           SUBJECTIVE STATEMENT: Pt states she is feeling pretty good today. Aquatics continues to go well.  PERTINENT HISTORY:  OA, A-Fib, DM, bilateral TKA, bilateral shoulder surgeries  PAIN: 09/29/22 Are you having pain? Yes: NPRS scale: 2/10 Pain location: low back Pain description: pain radiates from buttocks down bilateral LE past knees (Left more involved than Right) Aggravating factors: coughing, prolonged sitting and standing  Relieving factors: injection , sleep, weight shifting    PRECAUTIONS: None  RED FLAGS: None   WEIGHT BEARING RESTRICTIONS: No  FALLS:  Has patient fallen in last 6 months? No  LIVING ENVIRONMENT: Lives with: lives alone Lives in: House/apartment Stairs: No Has following equipment at home: Single point cane  OCCUPATION: Works in admissions in [employer]  PLOF: Independent and Leisure: crafting  PATIENT GOALS: To be able to walk without hunching over and less  pain.  NEXT MD VISIT: as needed  OBJECTIVE:   DIAGNOSTIC FINDINGS:  Lumbar MRI on 08/28/2022: IMPRESSION: 1. Lumbar spine spondylosis as described above. 2. No acute osseous injury of the lumbar spine.  Lumbar Radiograph on 08/09/2022: IMPRESSION: Multilevel degenerative changes of the lumbar spine as discussed above.  PATIENT SURVEYS:  Eval:  FOTO 40 (projected 56 by visit 12)  COGNITION: Overall cognitive status: Within functional limits for tasks assessed     SENSATION: WFL  MUSCLE LENGTH: Hamstrings: Tightness bilaterally   POSTURE: rounded shoulders, forward head, increased lumbar lordosis, increased thoracic kyphosis, and posterior pelvic tilt   LUMBAR ROM:  Some decreased motion with pain  LOWER EXTREMITY ROM:   WFL  LOWER EXTREMITY MMT:   Eval:  Bilateral LE strength grossly 4/5 throughout with some pain noted  LUMBAR SPECIAL TESTS:  Slump test: Positive  FUNCTIONAL TESTS:  09/22/2022: 5 times sit to stand: 10.15 sec Timed up and go (TUG): 12.84 sec  GAIT: Distance walked: 200 ft Assistive device utilized: None Level of assistance: Complete Independence Comments: Pt with antalgic gait  TODAY'S TREATMENT:    10/10/22: Nustep x 5 min L1 Seated pball roll out 5x10 sec, with lateral flexion x20 sec L stretch at counter x20 sec Seated hamstring stretch x 30 sec Supine LTR x10 Supine PPT + diaphragmatic breaths x10, with ball squeeze x10 Supine PPT + diaphragmatic breaths + glute set x10 Supine PPT + diaphragmatic breaths + bent knee fall outs blue TB x10 Supine feet on pball roll out  maintaining core contraction 2x10 Seated shoulder ER 2x10 Seated W 2x10  10/04/22: Patient seen for aquatic therapy today.  Treatment took place in water 2.5-4 feet deep depending upon activity.  Pt entered the pool via stairs with moderate use of railings, caution used. Pt requires buoyancy of water for support and to offload joints with strengthening exercises.    *  walking forward/ backward - then with added row motion with rainbow hand floats * side stepping with arm addct with rainbow hand floats -> 2nd lap with mini side squat   * relaxed squat at wall; L stretch with wag the tail * holding wall: hip openers (hip abdct/knee flex); hip crosses (hip addct/knee flex); single leg clams ,alternating LEs;  hip circles "draw circle on ground with toes";  L stretch with feet on wall  * return to walking forward/ backward with reciprocal arm swing * TrA set with short blue noodle pull down to thighs with 2 isometric holds on return to surface * straddling noodle:  cycling with gentle breast stroke arms   09/29/22: Nustep x 5 min level 3 (PT present to discuss status) Standing hamstring stretch 3 x 30 sec each LE (in room 13 with table lowered to lowest position) Standing quad stretch 3 x 30 sec each LE (in room 13 with table lowered to lowest position) Supine hook lying PPT x 20 Lower trunk rotation x 20 PPT with march x 20 PPT with alternating arms and legs x 20 PPT with clam x 20 with blue loop  Sidelying clam  09/28/22: Patient seen for aquatic therapy today.  Treatment took place in water 2.5-4 feet deep depending upon activity.  Pt entered the pool via stairs with moderate use of railings, caution used. Pt requires buoyancy of water for support and to offload joints with strengthening exercises.  Seated water bench with 75% submersion Pt performed seated LE AROM exercises 20x in all planes with concurrent discussion of current status, pain, [facility] and lastly water principles and how she can better utilize them. Pt verbally understood all principles and concepts. 75% water depth for water walking 6 lengths with UE single buoy weights for arm movements. Same weights for arm exercises with focus on core stabilization including: arm abd/add 10x, horizontal abd/add 10x, and shoulder ext/flexion 10x. Bicep and tricep curls with  mitten hands 2x10. Standing core /lat press with single buoy UE wt 10x with VC to contract her glutes.                                                                                                                            DATE: 09/22/2022  Review of HEP:  hamstring stretch, marching, hip abduction, hip extension Seated blue pball rollout 5x5 sec  PATIENT EDUCATION:  Education details: aquatic therapy modifications  Person educated: Patient Education method: Medical illustrator and Handouts Education comprehension: verbalized understanding  HOME EXERCISE PROGRAM: Access Code: [email or web address]  URL: [email or web address] Date: 10/10/2022 Prepared by: Vernon Prey April Kirstie Peri  Exercises - Seated Hamstring Stretch  - 1 x daily - 7 x weekly - 2 reps - 20 hold - Standing March with Counter Support  - 1 x daily - 7 x weekly - 2 sets - 10 reps - Standing Hip Abduction with Unilateral Counter Support  - 1 x daily - 7 x weekly - 2 sets - 10 reps - Standing Hip Extension with Unilateral Counter Support  - 1 x daily - 7 x weekly - 2 sets - 10 reps - Standing 'L' Stretch at Counter  - 1 x daily - 7 x weekly - 2 sets - 30 sec hold - Posterior Pelvic Tilt with Adduction Using Ball in Supine  - 1 x daily - 7 x weekly - 2 sets - 10 reps - Hooklying Clamshell with Resistance  - 1 x daily - 7 x weekly - 2 sets - 10 reps  ASSESSMENT:  CLINICAL IMPRESSION: Pt having a good day today in regards to her back pain. Worked on core strengthening and coordinating with hip strengthening. Pt tolerated well. Initiated midback strengthening this session -- pain is a little higher than her typical today.    OBJECTIVE IMPAIRMENTS: Abnormal gait, decreased activity tolerance, decreased balance, decreased coordination, decreased endurance, decreased mobility, difficulty walking, decreased ROM, decreased strength, impaired flexibility, improper body mechanics, postural dysfunction, obesity, and pain.    ACTIVITY LIMITATIONS: carrying, lifting, bending, sitting, standing, squatting, and locomotion level  PARTICIPATION LIMITATIONS: cleaning, laundry, community activity, and occupation  PERSONAL FACTORS: Age, Fitness, Profession, and 3+ comorbidities: OA, Bilat TKE, DM, A-Fib  are also affecting patient's functional outcome.   REHAB POTENTIAL: Good  CLINICAL DECISION MAKING: Evolving/moderate complexity  EVALUATION COMPLEXITY: Moderate   GOALS: Goals reviewed with patient? Yes  SHORT TERM GOALS: Target date: 10/14/2022  Patient will demonstrate competence with basic HEP. Baseline: Goal status: INITIAL  2.  Patient will perform a 6 MWT test to receive a baseline measurement to assess aerobic endurance and to set prolonged walking goals.  Baseline: patient shared they avoid prolonged walking due to pain  Goal status: INITIAL  3.  Patient will demonstrate proper technique when lifting light weight (5-10#) objects off of the floor without increased pain.  Baseline: MO[place] at airport  Goal status: INITIAL  4.  Patient will demonstrate proper log roll technique in and out bed to avoid increased pain. Baseline: transferring was difficult  Goal status: INITIAL   LONG TERM GOALS: Target date: 11/18/2022  Patient will perform advanced HEP independently upon discharge.  Baseline:  Goal status: INITIAL  2.  Patient will decrease TUG time to no greater than 10 sec in order to decrease the risk of future falls.  Baseline: 12.84 sec Goal status: INITIAL  3.  Patient will improve their FOTO score to 56% in order to participate in ADLs and IADLs without increased pain.  Baseline: 40% Goal status: INITIAL  4.  Patient will be able to walk for 20 minutes without increased pain for community activity, such as grocery shopping.  Baseline: patient shared that they have trouble walking to and from the bathroom at work  Goal status: INITIAL  5.  Patient will increase  global LE strength to at least 4+ to 5-/5 required for getting on and off chair at work.  Baseline: 4/5, patient reported fear of falling  Goal status: INITIAL   PLAN:  PT FREQUENCY: 1-2x/week  PT DURATION: 8 weeks  PLANNED INTERVENTIONS: Therapeutic exercises, Therapeutic activity, Neuromuscular re-education, Balance training, Gait training, Patient/Family education, Self Care, Joint mobilization, Stair training, Aquatic Therapy, Dry Needling, Electrical stimulation, Spinal manipulation, Spinal mobilization, Cryotherapy, Moist heat, Taping, Traction, Ultrasound, Ionotophoresis 4mg /ml Dexamethasone, Manual therapy, and Re-evaluation.  PLAN FOR NEXT SESSION: Aquatic and land therapy for LE flexibility and core strengthening.    Salwa Bai April Ma L Anjani Feuerborn, PT 10/10/22 2:50 PM

## 2022-10-11 ENCOUNTER — Other Ambulatory Visit: Payer: Self-pay

## 2022-10-11 ENCOUNTER — Other Ambulatory Visit (HOSPITAL_COMMUNITY): Payer: Self-pay

## 2022-10-12 ENCOUNTER — Ambulatory Visit (HOSPITAL_BASED_OUTPATIENT_CLINIC_OR_DEPARTMENT_OTHER): Payer: 59 | Admitting: Physical Therapy

## 2022-10-12 ENCOUNTER — Encounter (HOSPITAL_BASED_OUTPATIENT_CLINIC_OR_DEPARTMENT_OTHER): Payer: Self-pay | Admitting: Physical Therapy

## 2022-10-12 DIAGNOSIS — M5459 Other low back pain: Secondary | ICD-10-CM

## 2022-10-12 DIAGNOSIS — M6281 Muscle weakness (generalized): Secondary | ICD-10-CM | POA: Diagnosis not present

## 2022-10-12 DIAGNOSIS — R262 Difficulty in walking, not elsewhere classified: Secondary | ICD-10-CM | POA: Diagnosis not present

## 2022-10-12 DIAGNOSIS — R252 Cramp and spasm: Secondary | ICD-10-CM

## 2022-10-12 NOTE — Therapy (Signed)
OUTPATIENT PHYSICAL THERAPY THORACOLUMBAR Treatment Note   Patient Name: [name] MRN: [id number] DOB:11/10/1950, 72 y.o., female Today's Date: 10/12/2022  END OF SESSION:  PT End of Session - 10/12/22 1637     Visit Number 6    Date for PT Re-Evaluation 11/18/22    Authorization Type [employer]    Progress Note Due on Visit 10    PT Start Time 1547    PT Stop Time 1630    PT Time Calculation (min) 43 min    Activity Tolerance Patient tolerated treatment well    Behavior During Therapy WFL for tasks assessed/performed               Past Medical History:  Diagnosis Date   A-fib (HCC)    Anemia    hx of   Arthritis    Chest pain    Constipation    Depression    Depression    Diabetes mellitus ORAL MEDS   Diabetic retinopathy (HCC)    Diabetic retinopathy (HCC)    Dyspnea    with minimal exertion-deconditioned   Dyspnea    Dysrhythmia    a-fib   Food allergy    GERD (gastroesophageal reflux disease)    occasional   Gout    Heartburn    History of kidney stones    Hypercholesteremia    Hyperlipidemia    Hypertension    Hypertensive kidney disease    Hypothyroidism    Joint pain    Kidney problem    Knee pain, right    Left arm numbness DUE TO CERVICAL PINCHED NERVE   Obesity    OSA on CPAP    uses CPAP nightly   Osteoarthritis    Osteoarthritis    Palpitations    Pinched nerve in neck    Pleurisy    Pneumonia    hx of   PONV (postoperative nausea and vomiting)    for 3-4 days after general anesthesia approx 10-12 years ago   Sciatica    Shortness of breath    Sleep apnea    Spinal stenosis    Swelling of both lower extremities    Swelling of knee joint, right    Synovitis of knee RIGHT   Trigger finger, left    left index   Vitamin D deficiency    Past Surgical History:  Procedure Laterality Date   CESAREAN SECTION  X3   KNEE ARTHROSCOPY  04/20/2011   Procedure: ARTHROSCOPY KNEE;  Surgeon: Loanne Drilling, MD;  Location:  [facility];  Service: Orthopedics;  Laterality: Right;  WITH SYNOVECTOMY   LEFT CARPAL TUNNEL / LEFT MIDDLE & RING FINGER TRIGGER RELEASE  08-26-2008   LEFT SHOULDER ARTHROSCOPY W/ DEBRIDEMENT  09-09-2003   LEFT SHOULDER ARTHROSCOPY/ LEFT THUMB TRIGGER RELEASE  02-22-2005   PHOTOCOAGULATION WITH LASER Right 03/20/2018   Procedure: PHOTOCOAGULATION WITH LASER;  Surgeon: Carmela Rima, MD;  Location: [facility] OR;  Service: Ophthalmology;  Laterality: Right;   PULLEY RELEASE LEFT LONG FINGER  07-14-2009   RADIAL HEAD ARTHROPLASTY Right 06/17/2018   Procedure: RADIAL HEAD ARTHROPLASTY;  Surgeon: Bjorn Pippin, MD;  Location: [facility] OR;  Service: Orthopedics;  Laterality: Right;   RIGHT CARPAL TUNNEL/ RIGHT THUMB TRIGGER RELEASE'S  11-28-2006   RIGHT SHOULDER ARTHROSCOPY W/ ROTATOR CUFF REPAIR  01-13-2004   SHOULDER ARTHROSCOPY DISTAL CLAVICLE EXCISION AND OPEN ROTATOR CUFF REPAIR  09-07-2004   LEFT   SHOULDER ARTHROSCOPY W/ ACROMIAL REPAIR  11-29-2005  LEFT   SYNOVECTOMY Left 06/20/2022   Procedure: left index finger and left long finger metacarpal phalangel joint syonvectomy;  Surgeon: Dairl Ponder, MD;  Location: [facility] OR;  Service: Orthopedics;  Laterality: Left;  needs 1 hour of time..   TONSILLECTOMY AND ADENOIDECTOMY  child   TOTAL KNEE ARTHROPLASTY  12-14-2009   RIGHT   TOTAL KNEE ARTHROPLASTY Left 11/05/2012   Procedure: LEFT TOTAL KNEE ARTHROPLASTY;  Surgeon: Loanne Drilling, MD;  Location: [facility];  Service: Orthopedics;  Laterality: Left;   TOTAL KNEE REVISION Right 07/29/2016   Procedure: RIGHT KNEE POLY-LINER EXCHANGE;  Surgeon: Kathryne Hitch, MD;  Location: [facility];  Service: Orthopedics;  Laterality: Right;   TRIGGER FINGER RELEASE Right 02/21/2013   Procedure: RIGHT RING A-1 PULLEY RELEASE    (MINOR PROCEDURE) ;  Surgeon: Wyn Forster., MD;  Location: [facility];  Service: Orthopedics;  Laterality: Right;   TRIGGER FINGER RELEASE Left 09/22/2016    Procedure: RELEASE TRIGGER FINGER LEFT INDEX FINGER;  Surgeon: Kathryne Hitch, MD;  Location: [facility] OR;  Service: Orthopedics;  Laterality: Left;   TRIGGER FINGER RELEASE Right 01/20/2017   Procedure: RELEASE TRIGGER FINGER/A-1 PULLEY RIGHT INDEX FINGER;  Surgeon: Marcene Corning, MD;  Location: [facility];  Service: Orthopedics;  Laterality: Right;   VITRECTOMY 25 GAUGE WITH SCLERAL BUCKLE Right 03/20/2018   Procedure: RIGHT EYE VITRECTOMY WITH  ENDOLASER PARENTAL PHOTOCOAGULATION 25 GAUGE;  Surgeon: Carmela Rima, MD;  Location: [facility] OR;  Service: Ophthalmology;  Laterality: Right;   WRIST ARTHROSCOPY WITH DEBRIDEMENT Right 05/01/2019   Procedure: WRIST ARTHROSCOPY WITH DEBRIDEMENT;  Surgeon: Dairl Ponder, MD;  Location: [facility];  Service: Orthopedics;  Laterality: Right;   Patient Active Problem List   Diagnosis Date Noted   Notalgia 08/03/2022   BMI 45.0-49.9, adult (HCC) 06/13/2022   Other secondary hypertension 06/13/2022   SOBOE (shortness of breath on exertion) 05/30/2022   Depression screening 05/30/2022   Morbid obesity (HCC) 05/30/2022   Class 3 severe obesity with serious comorbidity and body mass index (BMI) of 50.0 to 59.9 in adult [facility]) 05/30/2022   Paroxysmal atrial fibrillation (HCC) 10/22/2021   Hypercoagulable state due to paroxysmal atrial fibrillation (HCC) 10/22/2021   Radial head fracture, closed 06/16/2018   Closed fracture dislocation of elbow 06/16/2018   Other fatigue 07/18/2017   Shortness of breath on exertion 07/18/2017   Type 2 diabetes mellitus with retinopathy, with long-term current use of insulin (HCC) 07/18/2017   Vitamin D deficiency 07/18/2017   B12 nutritional deficiency 07/18/2017   Other specified hypothyroidism 07/18/2017   Acute pain of right knee 12/22/2016   Trigger index finger of left hand 09/22/2016   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 requiring isolated polyethylene liner  exchange (HCC) 07/29/2016   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) 04/14/2016   Diabetes (HCC) 01/08/2014   Essential hypertension 01/08/2014   Hyperlipidemia 01/08/2014   Postoperative anemia due to acute blood loss 11/22/2012   Hyponatremia 11/06/2012   OA (osteoarthritis) of knee 11/05/2012   Villonodular synovitis of knee 04/20/2011    PCP: Thana Ates, MD  REFERRING PROVIDER: Rodolph Bong, MD   REFERRING DIAG: M54.42,M54.41,G89.29 (ICD-10-CM) - Chronic bilateral low back pain with bilateral sciatica   Rationale for Evaluation and Treatment: Rehabilitation  THERAPY DIAG:  Other low back pain  Cramp and spasm  Muscle weakness (generalized)  ONSET DATE: "a couple of years", but started worsening in May 2024  SUBJECTIVE:                                                                                                                                                                                           SUBJECTIVE STATEMENT: Pt states she had an increase in pain after last land based session. Went from 2/10 to 8/10 and lasted at least 12 hours.  PERTINENT HISTORY:  OA, A-Fib, DM, bilateral TKA, bilateral shoulder surgeries  PAIN: 09/29/22 Are you having pain? Yes: NPRS scale: 3/10 Pain location: low back Pain description: pain radiates from buttocks down bilateral LE past knees (Left more involved than Right) Aggravating factors: coughing, prolonged sitting and standing  Relieving factors: injection , sleep, weight shifting    PRECAUTIONS: None  RED FLAGS: None   WEIGHT BEARING RESTRICTIONS: No  FALLS:  Has patient fallen in last 6 months? No  LIVING ENVIRONMENT: Lives with: lives alone Lives in: House/apartment Stairs: No Has following equipment at home: Single point cane  OCCUPATION: Works in admissions in [employer]  PLOF: Independent and Leisure: crafting  PATIENT GOALS: To be able to walk without hunching over and less  pain.  NEXT MD VISIT: as needed  OBJECTIVE:   DIAGNOSTIC FINDINGS:  Lumbar MRI on 08/28/2022: IMPRESSION: 1. Lumbar spine spondylosis as described above. 2. No acute osseous injury of the lumbar spine.  Lumbar Radiograph on 08/09/2022: IMPRESSION: Multilevel degenerative changes of the lumbar spine as discussed above.  PATIENT SURVEYS:  Eval:  FOTO 40 (projected 56 by visit 12)  COGNITION: Overall cognitive status: Within functional limits for tasks assessed     SENSATION: WFL  MUSCLE LENGTH: Hamstrings: Tightness bilaterally   POSTURE: rounded shoulders, forward head, increased lumbar lordosis, increased thoracic kyphosis, and posterior pelvic tilt   LUMBAR ROM:  Some decreased motion with pain  LOWER EXTREMITY ROM:   WFL  LOWER EXTREMITY MMT:   Eval:  Bilateral LE strength grossly 4/5 throughout with some pain noted  LUMBAR SPECIAL TESTS:  Slump test: Positive  FUNCTIONAL TESTS:  09/22/2022: 5 times sit to stand: 10.15 sec Timed up and go (TUG): 12.84 sec  GAIT: Distance walked: 200 ft Assistive device utilized: None Level of assistance: Complete Independence Comments: Pt with antalgic gait  TODAY'S TREATMENT:    10/12/22: Patient seen for aquatic therapy today.  Treatment took place in water 2.5-4 feet deep depending upon activity.  Pt entered the pool via stairs with moderate use of railings, caution used. Pt requires buoyancy of water for support and to offload joints with strengthening exercises.    * walking forward/ backward - then with added row motion with rainbow hand  floats * side stepping with slight lunges with arm addct with rainbow hand floats x 4 widths *L stretch on bottom step x 3: L stretch with wag the tail * holding wall: hip openers (hip abdct/knee flex)x 10 * TrA set with solid noodle wide stance then staggered x 10 rep. (Improved from 1/2 noodle) *Core engagement: rainbow HB carry forward and back x 2 widths each.  Increased depth  for I proved toleration as pt reports slight discomfort in LB with shallower submersion * decompression using yellow noodle: then cycling in position.  Pt requires the buoyancy and hydrostatic pressure of water for support, and to offload joints by unweighting joint load by at least 50 % in navel deep water and by at least 75-80% in chest to neck deep water.  Viscosity of the water is needed for resistance of strengthening. Water current perturbations provides challenge to standing balance requiring increased core activation.    10/10/22: Nustep x 5 min L1 Seated pball roll out 5x10 sec, with lateral flexion x20 sec L stretch at counter x20 sec Seated hamstring stretch x 30 sec Supine LTR x10 Supine PPT + diaphragmatic breaths x10, with ball squeeze x10 Supine PPT + diaphragmatic breaths + glute set x10 Supine PPT + diaphragmatic breaths + bent knee fall outs blue TB x10 Supine feet on pball roll out maintaining core contraction 2x10 Seated shoulder ER 2x10 Seated W 2x10  10/04/22: Patient seen for aquatic therapy today.  Treatment took place in water 2.5-4 feet deep depending upon activity.  Pt entered the pool via stairs with moderate use of railings, caution used. Pt requires buoyancy of water for support and to offload joints with strengthening exercises.    * walking forward/ backward - then with added row motion with rainbow hand floats * side stepping with arm addct with rainbow hand floats -> 2nd lap with mini side squat   * relaxed squat at wall; L stretch with wag the tail * holding wall: hip openers (hip abdct/knee flex); hip crosses (hip addct/knee flex); single leg clams ,alternating LEs;  hip circles "draw circle on ground with toes";  L stretch with feet on wall  * return to walking forward/ backward with reciprocal arm swing * TrA set with short blue noodle pull down to thighs with 2 isometric holds on return to surface * straddling noodle:  cycling with gentle breast stroke  arms   09/29/22: Nustep x 5 min level 3 (PT present to discuss status) Standing hamstring stretch 3 x 30 sec each LE (in room 13 with table lowered to lowest position) Standing quad stretch 3 x 30 sec each LE (in room 13 with table lowered to lowest position) Supine hook lying PPT x 20 Lower trunk rotation x 20 PPT with march x 20 PPT with alternating arms and legs x 20 PPT with clam x 20 with blue loop  Sidelying clam  09/28/22: Patient seen for aquatic therapy today.  Treatment took place in water 2.5-4 feet deep depending upon activity.  Pt entered the pool via stairs with moderate use of railings, caution used. Pt requires buoyancy of water for support and to offload joints with strengthening exercises.  Seated water bench with 75% submersion Pt performed seated LE AROM exercises 20x in all planes with concurrent discussion of current status, pain, [facility] and lastly water principles and how she can better utilize them. Pt verbally understood all principles and concepts. 75% water depth for water  walking 6 lengths with UE single buoy weights for arm movements. Same weights for arm exercises with focus on core stabilization including: arm abd/add 10x, horizontal abd/add 10x, and shoulder ext/flexion 10x. Bicep and tricep curls with mitten hands 2x10. Standing core /lat press with single buoy UE wt 10x with VC to contract her glutes.                                                                                                                            DATE: 09/22/2022  Review of HEP:  hamstring stretch, marching, hip abduction, hip extension Seated blue pball rollout 5x5 sec  PATIENT EDUCATION:  Education details: aquatic therapy modifications  Person educated: Patient Education method: Medical illustrator and Handouts Education comprehension: verbalized understanding  HOME EXERCISE PROGRAM: Access Code: [email or web address] URL:  [email or web address] Date: 10/10/2022 Prepared by: Vernon Prey April Kirstie Peri  Exercises - Seated Hamstring Stretch  - 1 x daily - 7 x weekly - 2 reps - 20 hold - Standing March with Counter Support  - 1 x daily - 7 x weekly - 2 sets - 10 reps - Standing Hip Abduction with Unilateral Counter Support  - 1 x daily - 7 x weekly - 2 sets - 10 reps - Standing Hip Extension with Unilateral Counter Support  - 1 x daily - 7 x weekly - 2 sets - 10 reps - Standing 'L' Stretch at Counter  - 1 x daily - 7 x weekly - 2 sets - 30 sec hold - Posterior Pelvic Tilt with Adduction Using Ball in Supine  - 1 x daily - 7 x weekly - 2 sets - 10 reps - Hooklying Clamshell with Resistance  - 1 x daily - 7 x weekly - 2 sets - 10 reps  ASSESSMENT:  CLINICAL IMPRESSION: Pt with increase of pain sensitivity after last session. States she felt ok while completing tasks but when she got home pain spiked. Pt with fair toleration to progressed core strengthening. Increased resistive foam with tasks. She does report increased "zinging" through her rle upon completion.  Slight increase in pain to 3/10 from 2/10.     OBJECTIVE IMPAIRMENTS: Abnormal gait, decreased activity tolerance, decreased balance, decreased coordination, decreased endurance, decreased mobility, difficulty walking, decreased ROM, decreased strength, impaired flexibility, improper body mechanics, postural dysfunction, obesity, and pain.   ACTIVITY LIMITATIONS: carrying, lifting, bending, sitting, standing, squatting, and locomotion level  PARTICIPATION LIMITATIONS: cleaning, laundry, community activity, and occupation  PERSONAL FACTORS: Age, Fitness, Profession, and 3+ comorbidities: OA, Bilat TKE, DM, A-Fib  are also affecting patient's functional outcome.   REHAB POTENTIAL: Good  CLINICAL DECISION MAKING: Evolving/moderate complexity  EVALUATION COMPLEXITY: Moderate   GOALS: Goals reviewed with patient? Yes  SHORT TERM GOALS:  Target date: 10/14/2022  Patient will demonstrate competence with basic HEP. Baseline: Goal status: INITIAL  2.  Patient will perform a 6 MWT test to receive a baseline measurement to assess aerobic endurance and to set prolonged  walking goals.  Baseline: patient shared they avoid prolonged walking due to pain  Goal status: INITIAL  3.  Patient will demonstrate proper technique when lifting light weight (5-10#) objects off of the floor without increased pain.  Baseline: MO[place] at airport  Goal status: INITIAL  4.  Patient will demonstrate proper log roll technique in and out bed to avoid increased pain. Baseline: transferring was difficult  Goal status: INITIAL   LONG TERM GOALS: Target date: 11/18/2022  Patient will perform advanced HEP independently upon discharge.  Baseline:  Goal status: INITIAL  2.  Patient will decrease TUG time to no greater than 10 sec in order to decrease the risk of future falls.  Baseline: 12.84 sec Goal status: INITIAL  3.  Patient will improve their FOTO score to 56% in order to participate in ADLs and IADLs without increased pain.  Baseline: 40% Goal status: INITIAL  4.  Patient will be able to walk for 20 minutes without increased pain for community activity, such as grocery shopping.  Baseline: patient shared that they have trouble walking to and from the bathroom at work  Goal status: INITIAL  5.  Patient will increase global LE strength to at least 4+ to 5-/5 required for getting on and off chair at work.  Baseline: 4/5, patient reported fear of falling  Goal status: INITIAL   PLAN:  PT FREQUENCY: 1-2x/week  PT DURATION: 8 weeks  PLANNED INTERVENTIONS: Therapeutic exercises, Therapeutic activity, Neuromuscular re-education, Balance training, Gait training, Patient/Family education, Self Care, Joint mobilization, Stair training, Aquatic Therapy, Dry Needling, Electrical stimulation, Spinal manipulation, Spinal  mobilization, Cryotherapy, Moist heat, Taping, Traction, Ultrasound, Ionotophoresis 4mg /ml Dexamethasone, Manual therapy, and Re-evaluation.  PLAN FOR NEXT SESSION: Aquatic and land therapy for LE flexibility and core strengthening.    Geni Bers, PT 10/12/22 4:38 PM

## 2022-10-14 ENCOUNTER — Other Ambulatory Visit (HOSPITAL_COMMUNITY): Payer: Self-pay

## 2022-10-14 DIAGNOSIS — H5213 Myopia, bilateral: Secondary | ICD-10-CM | POA: Diagnosis not present

## 2022-10-14 DIAGNOSIS — E113293 Type 2 diabetes mellitus with mild nonproliferative diabetic retinopathy without macular edema, bilateral: Secondary | ICD-10-CM | POA: Diagnosis not present

## 2022-10-17 ENCOUNTER — Ambulatory Visit: Payer: 59 | Attending: Family Medicine | Admitting: Physical Therapy

## 2022-10-17 DIAGNOSIS — M6281 Muscle weakness (generalized): Secondary | ICD-10-CM | POA: Insufficient documentation

## 2022-10-17 DIAGNOSIS — R262 Difficulty in walking, not elsewhere classified: Secondary | ICD-10-CM | POA: Insufficient documentation

## 2022-10-17 DIAGNOSIS — R293 Abnormal posture: Secondary | ICD-10-CM | POA: Insufficient documentation

## 2022-10-17 DIAGNOSIS — R2689 Other abnormalities of gait and mobility: Secondary | ICD-10-CM | POA: Diagnosis not present

## 2022-10-17 DIAGNOSIS — R252 Cramp and spasm: Secondary | ICD-10-CM | POA: Insufficient documentation

## 2022-10-17 DIAGNOSIS — M5459 Other low back pain: Secondary | ICD-10-CM | POA: Insufficient documentation

## 2022-10-17 NOTE — Therapy (Signed)
OUTPATIENT PHYSICAL THERAPY THORACOLUMBAR Treatment Note   Patient Name: [name] MRN: [id number] DOB:November 03, 1950, 72 y.o., female Today's Date: 10/17/2022  END OF SESSION:  PT End of Session - 10/17/22 1455     Visit Number 7    Date for PT Re-Evaluation 11/18/22    Authorization Type [employer]    Progress Note Due on Visit 10    PT Start Time 1450    PT Stop Time 1530    PT Time Calculation (min) 40 min    Activity Tolerance Patient tolerated treatment well    Behavior During Therapy WFL for tasks assessed/performed              Past Medical History:  Diagnosis Date   A-fib (HCC)    Anemia    hx of   Arthritis    Chest pain    Constipation    Depression    Depression    Diabetes mellitus ORAL MEDS   Diabetic retinopathy (HCC)    Diabetic retinopathy (HCC)    Dyspnea    with minimal exertion-deconditioned   Dyspnea    Dysrhythmia    a-fib   Food allergy    GERD (gastroesophageal reflux disease)    occasional   Gout    Heartburn    History of kidney stones    Hypercholesteremia    Hyperlipidemia    Hypertension    Hypertensive kidney disease    Hypothyroidism    Joint pain    Kidney problem    Knee pain, right    Left arm numbness DUE TO CERVICAL PINCHED NERVE   Obesity    OSA on CPAP    uses CPAP nightly   Osteoarthritis    Osteoarthritis    Palpitations    Pinched nerve in neck    Pleurisy    Pneumonia    hx of   PONV (postoperative nausea and vomiting)    for 3-4 days after general anesthesia approx 10-12 years ago   Sciatica    Shortness of breath    Sleep apnea    Spinal stenosis    Swelling of both lower extremities    Swelling of knee joint, right    Synovitis of knee RIGHT   Trigger finger, left    left index   Vitamin D deficiency    Past Surgical History:  Procedure Laterality Date   CESAREAN SECTION  X3   KNEE ARTHROSCOPY  04/20/2011   Procedure: ARTHROSCOPY KNEE;  Surgeon: Loanne Drilling, MD;  Location: [facility]  ;  Service: Orthopedics;  Laterality: Right;  WITH SYNOVECTOMY   LEFT CARPAL TUNNEL / LEFT MIDDLE & RING FINGER TRIGGER RELEASE  08-26-2008   LEFT SHOULDER ARTHROSCOPY W/ DEBRIDEMENT  09-09-2003   LEFT SHOULDER ARTHROSCOPY/ LEFT THUMB TRIGGER RELEASE  02-22-2005   PHOTOCOAGULATION WITH LASER Right 03/20/2018   Procedure: PHOTOCOAGULATION WITH LASER;  Surgeon: Carmela Rima, MD;  Location: [facility];  Service: Ophthalmology;  Laterality: Right;   PULLEY RELEASE LEFT LONG FINGER  07-14-2009   RADIAL HEAD ARTHROPLASTY Right 06/17/2018   Procedure: RADIAL HEAD ARTHROPLASTY;  Surgeon: Bjorn Pippin, MD;  Location: [facility] OR;  Service: Orthopedics;  Laterality: Right;   RIGHT CARPAL TUNNEL/ RIGHT THUMB TRIGGER RELEASE'S  11-28-2006   RIGHT SHOULDER ARTHROSCOPY W/ ROTATOR CUFF REPAIR  01-13-2004   SHOULDER ARTHROSCOPY DISTAL CLAVICLE EXCISION AND OPEN ROTATOR CUFF REPAIR  09-07-2004   LEFT   SHOULDER ARTHROSCOPY W/ ACROMIAL REPAIR  11-29-2005   LEFT  SYNOVECTOMY Left 06/20/2022   Procedure: left index finger and left long finger metacarpal phalangel joint syonvectomy;  Surgeon: Dairl Ponder, MD;  Location: [facility] OR;  Service: Orthopedics;  Laterality: Left;  needs 1 hour of time..   TONSILLECTOMY AND ADENOIDECTOMY  child   TOTAL KNEE ARTHROPLASTY  12-14-2009   RIGHT   TOTAL KNEE ARTHROPLASTY Left 11/05/2012   Procedure: LEFT TOTAL KNEE ARTHROPLASTY;  Surgeon: Loanne Drilling, MD;  Location: [facility];  Service: Orthopedics;  Laterality: Left;   TOTAL KNEE REVISION Right 07/29/2016   Procedure: RIGHT KNEE POLY-LINER EXCHANGE;  Surgeon: Kathryne Hitch, MD;  Location: [facility];  Service: Orthopedics;  Laterality: Right;   TRIGGER FINGER RELEASE Right 02/21/2013   Procedure: RIGHT RING A-1 PULLEY RELEASE    (MINOR PROCEDURE) ;  Surgeon: Wyn Forster., MD;  Location: [facility];  Service: Orthopedics;  Laterality: Right;   TRIGGER FINGER RELEASE Left 09/22/2016    Procedure: RELEASE TRIGGER FINGER LEFT INDEX FINGER;  Surgeon: Kathryne Hitch, MD;  Location: [facility] OR;  Service: Orthopedics;  Laterality: Left;   TRIGGER FINGER RELEASE Right 01/20/2017   Procedure: RELEASE TRIGGER FINGER/A-1 PULLEY RIGHT INDEX FINGER;  Surgeon: Marcene Corning, MD;  Location: [facility];  Service: Orthopedics;  Laterality: Right;   VITRECTOMY 25 GAUGE WITH SCLERAL BUCKLE Right 03/20/2018   Procedure: RIGHT EYE VITRECTOMY WITH  ENDOLASER PARENTAL PHOTOCOAGULATION 25 GAUGE;  Surgeon: Carmela Rima, MD;  Location: [facility] OR;  Service: Ophthalmology;  Laterality: Right;   WRIST ARTHROSCOPY WITH DEBRIDEMENT Right 05/01/2019   Procedure: WRIST ARTHROSCOPY WITH DEBRIDEMENT;  Surgeon: Dairl Ponder, MD;  Location: [facility];  Service: Orthopedics;  Laterality: Right;   Patient Active Problem List   Diagnosis Date Noted   Notalgia 08/03/2022   BMI 45.0-49.9, adult (HCC) 06/13/2022   Other secondary hypertension 06/13/2022   SOBOE (shortness of breath on exertion) 05/30/2022   Depression screening 05/30/2022   Morbid obesity (HCC) 05/30/2022   Class 3 severe obesity with serious comorbidity and body mass index (BMI) of 50.0 to 59.9 in adult [facility]) 05/30/2022   Paroxysmal atrial fibrillation (HCC) 10/22/2021   Hypercoagulable state due to paroxysmal atrial fibrillation (HCC) 10/22/2021   Radial head fracture, closed 06/16/2018   Closed fracture dislocation of elbow 06/16/2018   Other fatigue 07/18/2017   Shortness of breath on exertion 07/18/2017   Type 2 diabetes mellitus with retinopathy, with long-term current use of insulin (HCC) 07/18/2017   Vitamin D deficiency 07/18/2017   B12 nutritional deficiency 07/18/2017   Other specified hypothyroidism 07/18/2017   Acute pain of right knee 12/22/2016   Trigger index finger of left hand 09/22/2016   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 requiring isolated polyethylene liner  exchange (HCC) 07/29/2016   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) 04/14/2016   Diabetes (HCC) 01/08/2014   Essential hypertension 01/08/2014   Hyperlipidemia 01/08/2014   Postoperative anemia due to acute blood loss 11/22/2012   Hyponatremia 11/06/2012   OA (osteoarthritis) of knee 11/05/2012   Villonodular synovitis of knee 04/20/2011    PCP: Thana Ates, MD  REFERRING PROVIDER: Rodolph Bong, MD   REFERRING DIAG: M54.42,M54.41,G89.29 (ICD-10-CM) - Chronic bilateral low back pain with bilateral sciatica   Rationale for Evaluation and Treatment: Rehabilitation  THERAPY DIAG:  Other low back pain  Cramp and spasm  Muscle weakness (generalized)  Difficulty in walking, not elsewhere classified  Abnormal posture  Other abnormalities of gait and mobility  ONSET DATE: "a couple of years", but started worsening in May 2024  SUBJECTIVE:                                                                                                                                                                                           SUBJECTIVE STATEMENT: Pt states she felt she did a lot during the weekend. Back pain is a lot. Pt states she did feel some increase in pain after aquatics last session. Pt notes her pain is not constant but it will grip her suddenly when she thinks she's doing great. Pt states she feels like she is compressed when she stands  PERTINENT HISTORY:  OA, A-Fib, DM, bilateral TKA, bilateral shoulder surgeries  PAIN:  Are you having pain? Yes: NPRS scale: 3/10 Pain location: bilat glutes, low back Pain description: pain radiates from buttocks down bilateral LE past knees (Left more involved than Right) Aggravating factors: coughing, prolonged sitting and standing  Relieving factors: injection , sleep, weight shifting    PRECAUTIONS: None  RED FLAGS: None   WEIGHT BEARING RESTRICTIONS: No  FALLS:  Has patient fallen in last 6 months?  No  LIVING ENVIRONMENT: Lives with: lives alone Lives in: House/apartment Stairs: No Has following equipment at home: Single point cane  OCCUPATION: Works in admissions in [employer]  PLOF: Independent and Leisure: crafting  PATIENT GOALS: To be able to walk without hunching over and less pain.  NEXT MD VISIT: as needed  OBJECTIVE:   DIAGNOSTIC FINDINGS:  Lumbar MRI on 08/28/2022: IMPRESSION: 1. Lumbar spine spondylosis as described above. 2. No acute osseous injury of the lumbar spine.  Lumbar Radiograph on 08/09/2022: IMPRESSION: Multilevel degenerative changes of the lumbar spine as discussed above.  PATIENT SURVEYS:  Eval:  FOTO 40 (projected 56 by visit 12)  COGNITION: Overall cognitive status: Within functional limits for tasks assessed     SENSATION: WFL  MUSCLE LENGTH: Hamstrings: Tightness bilaterally   POSTURE: rounded shoulders, forward head, increased lumbar lordosis, increased thoracic kyphosis, and posterior pelvic tilt   LUMBAR ROM:  Some decreased motion with pain  LOWER EXTREMITY ROM:   WFL  LOWER EXTREMITY MMT:   Eval:  Bilateral LE strength grossly 4/5 throughout with some pain noted  LUMBAR SPECIAL TESTS:  Slump test: Positive  FUNCTIONAL TESTS:  09/22/2022: 5 times sit to stand: 10.15 sec Timed up and go (TUG): 12.84 sec  GAIT: Distance walked: 200 ft Assistive device utilized: None Level of assistance: Complete Independence Comments: Pt with antalgic gait  TODAY'S TREATMENT:    10/17/22: Therex Nustep L5 x 5 min UEs/LEs Sitting figure 4 stretch x1  min Sitting piriformis stretch x 1 min Manual therapy STM & TPR bilat glutes, lumbar paraspinals Skilled assessment and palpation for TPDN Trigger Point Dry-Needling  Treatment instructions: Expect mild to moderate muscle soreness. S/S of pneumothorax if dry needled over a lung field, and to seek immediate medical attention should they occur. Patient verbalized understanding of  these instructions and education. Patient Consent Given: Yes Education handout provided: Previously provided Muscles treated: bilat glute max, glute med Electrical stimulation performed: No Parameters: N/A Treatment response/outcome: Twitch response, decreased muscle tension Modalities Lumbar traction x 15 min; 125lb max, 45lb min, 50% speed   10/12/22: Patient seen for aquatic therapy today.  Treatment took place in water 2.5-4 feet deep depending upon activity.  Pt entered the pool via stairs with moderate use of railings, caution used. Pt requires buoyancy of water for support and to offload joints with strengthening exercises.    * walking forward/ backward - then with added row motion with rainbow hand floats * side stepping with slight lunges with arm addct with rainbow hand floats x 4 widths *L stretch on bottom step x 3: L stretch with wag the tail * holding wall: hip openers (hip abdct/knee flex)x 10 * TrA set with solid noodle wide stance then staggered x 10 rep. (Improved from 1/2 noodle) *Core engagement: rainbow HB carry forward and back x 2 widths each.  Increased depth for I proved toleration as pt reports slight discomfort in LB with shallower submersion * decompression using yellow noodle: then cycling in position.  Pt requires the buoyancy and hydrostatic pressure of water for support, and to offload joints by unweighting joint load by at least 50 % in navel deep water and by at least 75-80% in chest to neck deep water.  Viscosity of the water is needed for resistance of strengthening. Water current perturbations provides challenge to standing balance requiring increased core activation.    10/10/22: Nustep x 5 min L1 Seated pball roll out 5x10 sec, with lateral flexion x20 sec L stretch at counter x20 sec Seated hamstring stretch x 30 sec Supine LTR x10 Supine PPT + diaphragmatic breaths x10, with ball squeeze x10 Supine PPT + diaphragmatic breaths + glute set  x10 Supine PPT + diaphragmatic breaths + bent knee fall outs blue TB x10 Supine feet on pball roll out maintaining core contraction 2x10 Seated shoulder ER 2x10 Seated W 2x10  10/04/22: Patient seen for aquatic therapy today.  Treatment took place in water 2.5-4 feet deep depending upon activity.  Pt entered the pool via stairs with moderate use of railings, caution used. Pt requires buoyancy of water for support and to offload joints with strengthening exercises.    * walking forward/ backward - then with added row motion with rainbow hand floats * side stepping with arm addct with rainbow hand floats -> 2nd lap with mini side squat   * relaxed squat at wall; L stretch with wag the tail * holding wall: hip openers (hip abdct/knee flex); hip crosses (hip addct/knee flex); single leg clams ,alternating LEs;  hip circles "draw circle on ground with toes";  L stretch with feet on wall  * return to walking forward/ backward with reciprocal arm swing * TrA set with short blue noodle pull down to thighs with 2 isometric holds on return to surface * straddling noodle:  cycling with gentle breast stroke arms   09/29/22: Nustep x 5 min level 3 (PT present to discuss status) Standing hamstring stretch 3 x 30 sec  each LE (in room 13 with table lowered to lowest position) Standing quad stretch 3 x 30 sec each LE (in room 13 with table lowered to lowest position) Supine hook lying PPT x 20 Lower trunk rotation x 20 PPT with march x 20 PPT with alternating arms and legs x 20 PPT with clam x 20 with blue loop  Sidelying clam  09/28/22: Patient seen for aquatic therapy today.  Treatment took place in water 2.5-4 feet deep depending upon activity.  Pt entered the pool via stairs with moderate use of railings, caution used. Pt requires buoyancy of water for support and to offload joints with strengthening exercises.  Seated water bench with 75% submersion Pt performed seated LE AROM exercises 20x in all  planes with concurrent discussion of current status, pain, [facility] and lastly water principles and how she can better utilize them. Pt verbally understood all principles and concepts. 75% water depth for water walking 6 lengths with UE single buoy weights for arm movements. Same weights for arm exercises with focus on core stabilization including: arm abd/add 10x, horizontal abd/add 10x, and shoulder ext/flexion 10x. Bicep and tricep curls with mitten hands 2x10. Standing core /lat press with single buoy UE wt 10x with VC to contract her glutes.                                                                                                                            DATE: 09/22/2022  Review of HEP:  hamstring stretch, marching, hip abduction, hip extension Seated blue pball rollout 5x5 sec  PATIENT EDUCATION:  Education details: aquatic therapy modifications  Person educated: Patient Education method: Medical illustrator and Handouts Education comprehension: verbalized understanding  HOME EXERCISE PROGRAM: Access Code: [email or web address] URL: [email or web address] Date: 10/10/2022 Prepared by: Vernon Prey April Kirstie Peri  Exercises - Seated Hamstring Stretch  - 1 x daily - 7 x weekly - 2 reps - 20 hold - Standing March with Counter Support  - 1 x daily - 7 x weekly - 2 sets - 10 reps - Standing Hip Abduction with Unilateral Counter Support  - 1 x daily - 7 x weekly - 2 sets - 10 reps - Standing Hip Extension with Unilateral Counter Support  - 1 x daily - 7 x weekly - 2 sets - 10 reps - Standing 'L' Stretch at Counter  - 1 x daily - 7 x weekly - 2 sets - 30 sec hold - Posterior Pelvic Tilt with Adduction Using Ball in Supine  - 1 x daily - 7 x weekly - 2 sets - 10 reps - Hooklying Clamshell with Resistance  - 1 x daily - 7 x weekly - 2 sets - 10 reps  ASSESSMENT:  CLINICAL IMPRESSION: Pt notes increased pain exacerbation after last pool  session and land session. Performed trial of TPDN and traction today to aid in pt's feelings that she is being "compressed." Pt notes  feeling pretty good after today's session.    OBJECTIVE IMPAIRMENTS: Abnormal gait, decreased activity tolerance, decreased balance, decreased coordination, decreased endurance, decreased mobility, difficulty walking, decreased ROM, decreased strength, impaired flexibility, improper body mechanics, postural dysfunction, obesity, and pain.   ACTIVITY LIMITATIONS: carrying, lifting, bending, sitting, standing, squatting, and locomotion level  PARTICIPATION LIMITATIONS: cleaning, laundry, community activity, and occupation  PERSONAL FACTORS: Age, Fitness, Profession, and 3+ comorbidities: OA, Bilat TKE, DM, A-Fib  are also affecting patient's functional outcome.   REHAB POTENTIAL: Good  CLINICAL DECISION MAKING: Evolving/moderate complexity  EVALUATION COMPLEXITY: Moderate   GOALS: Goals reviewed with patient? Yes  SHORT TERM GOALS: Target date: 10/14/2022  Patient will demonstrate competence with basic HEP. Baseline: Goal status: INITIAL  2.  Patient will perform a 6 MWT test to receive a baseline measurement to assess aerobic endurance and to set prolonged walking goals.  Baseline: patient shared they avoid prolonged walking due to pain  Goal status: INITIAL  3.  Patient will demonstrate proper technique when lifting light weight (5-10#) objects off of the floor without increased pain.  Baseline: MOI [place] at airport  Goal status: INITIAL  4.  Patient will demonstrate proper log roll technique in and out bed to avoid increased pain. Baseline: transferring was difficult  Goal status: INITIAL   LONG TERM GOALS: Target date: 11/18/2022  Patient will perform advanced HEP independently upon discharge.  Baseline:  Goal status: INITIAL  2.  Patient will decrease TUG time to no greater than 10 sec in order to decrease the risk of future  falls.  Baseline: 12.84 sec Goal status: INITIAL  3.  Patient will improve their FOTO score to 56% in order to participate in ADLs and IADLs without increased pain.  Baseline: 40% Goal status: INITIAL  4.  Patient will be able to walk for 20 minutes without increased pain for community activity, such as grocery shopping.  Baseline: patient shared that they have trouble walking to and from the bathroom at work  Goal status: INITIAL  5.  Patient will increase global LE strength to at least 4+ to 5-/5 required for getting on and off chair at work.  Baseline: 4/5, patient reported fear of falling  Goal status: INITIAL   PLAN:  PT FREQUENCY: 1-2x/week  PT DURATION: 8 weeks  PLANNED INTERVENTIONS: Therapeutic exercises, Therapeutic activity, Neuromuscular re-education, Balance training, Gait training, Patient/Family education, Self Care, Joint mobilization, Stair training, Aquatic Therapy, Dry Needling, Electrical stimulation, Spinal manipulation, Spinal mobilization, Cryotherapy, Moist heat, Taping, Traction, Ultrasound, Ionotophoresis 4mg /ml Dexamethasone, Manual therapy, and Re-evaluation.  PLAN FOR NEXT SESSION: Aquatic and land therapy for LE flexibility and core strengthening. Check STGs on next land visit!     April Dell Ponto, PT 10/17/22 2:55 PM

## 2022-10-19 ENCOUNTER — Ambulatory Visit (HOSPITAL_BASED_OUTPATIENT_CLINIC_OR_DEPARTMENT_OTHER): Payer: 59 | Attending: Family Medicine | Admitting: Physical Therapy

## 2022-10-19 ENCOUNTER — Encounter (HOSPITAL_BASED_OUTPATIENT_CLINIC_OR_DEPARTMENT_OTHER): Payer: Self-pay | Admitting: Physical Therapy

## 2022-10-19 DIAGNOSIS — M5459 Other low back pain: Secondary | ICD-10-CM | POA: Diagnosis not present

## 2022-10-19 DIAGNOSIS — M6281 Muscle weakness (generalized): Secondary | ICD-10-CM | POA: Insufficient documentation

## 2022-10-19 DIAGNOSIS — R252 Cramp and spasm: Secondary | ICD-10-CM | POA: Insufficient documentation

## 2022-10-19 NOTE — Therapy (Signed)
OUTPATIENT PHYSICAL THERAPY THORACOLUMBAR Treatment Note   Patient Name: [name] MRN: [id number] DOB:February 27, 1951, 72 y.o., female Today's Date: 10/19/2022  END OF SESSION:  PT End of Session - 10/19/22 1735     Visit Number 8    Date for PT Re-Evaluation 11/18/22    Authorization Type [employer]    Progress Note Due on Visit 10    PT Start Time 1547    PT Stop Time 1630    PT Time Calculation (min) 43 min    Activity Tolerance Patient tolerated treatment well    Behavior During Therapy WFL for tasks assessed/performed               Past Medical History:  Diagnosis Date   A-fib (HCC)    Anemia    hx of   Arthritis    Chest pain    Constipation    Depression    Depression    Diabetes mellitus ORAL MEDS   Diabetic retinopathy (HCC)    Diabetic retinopathy (HCC)    Dyspnea    with minimal exertion-deconditioned   Dyspnea    Dysrhythmia    a-fib   Food allergy    GERD (gastroesophageal reflux disease)    occasional   Gout    Heartburn    History of kidney stones    Hypercholesteremia    Hyperlipidemia    Hypertension    Hypertensive kidney disease    Hypothyroidism    Joint pain    Kidney problem    Knee pain, right    Left arm numbness DUE TO CERVICAL PINCHED NERVE   Obesity    OSA on CPAP    uses CPAP nightly   Osteoarthritis    Osteoarthritis    Palpitations    Pinched nerve in neck    Pleurisy    Pneumonia    hx of   PONV (postoperative nausea and vomiting)    for 3-4 days after general anesthesia approx 10-12 years ago   Sciatica    Shortness of breath    Sleep apnea    Spinal stenosis    Swelling of both lower extremities    Swelling of knee joint, right    Synovitis of knee RIGHT   Trigger finger, left    left index   Vitamin D deficiency    Past Surgical History:  Procedure Laterality Date   CESAREAN SECTION  X3   KNEE ARTHROSCOPY  04/20/2011   Procedure: ARTHROSCOPY KNEE;  Surgeon: Loanne Drilling, MD;  Location: [facility];  Service: Orthopedics;  Laterality: Right;  WITH SYNOVECTOMY   LEFT CARPAL TUNNEL / LEFT MIDDLE & RING FINGER TRIGGER RELEASE  08-26-2008   LEFT SHOULDER ARTHROSCOPY W/ DEBRIDEMENT  09-09-2003   LEFT SHOULDER ARTHROSCOPY/ LEFT THUMB TRIGGER RELEASE  02-22-2005   PHOTOCOAGULATION WITH LASER Right 03/20/2018   Procedure: PHOTOCOAGULATION WITH LASER;  Surgeon: Carmela Rima, MD;  Location: [facility] OR;  Service: Ophthalmology;  Laterality: Right;   PULLEY RELEASE LEFT LONG FINGER  07-14-2009   RADIAL HEAD ARTHROPLASTY Right 06/17/2018   Procedure: RADIAL HEAD ARTHROPLASTY;  Surgeon: Bjorn Pippin, MD;  Location: [facility] OR;  Service: Orthopedics;  Laterality: Right;   RIGHT CARPAL TUNNEL/ RIGHT THUMB TRIGGER RELEASE'S  11-28-2006   RIGHT SHOULDER ARTHROSCOPY W/ ROTATOR CUFF REPAIR  01-13-2004   SHOULDER ARTHROSCOPY DISTAL CLAVICLE EXCISION AND OPEN ROTATOR CUFF REPAIR  09-07-2004   LEFT   SHOULDER ARTHROSCOPY W/ ACROMIAL REPAIR  11-29-2005  LEFT   SYNOVECTOMY Left 06/20/2022   Procedure: left index finger and left long finger metacarpal phalangel joint syonvectomy;  Surgeon: Dairl Ponder, MD;  Location: [facility] OR;  Service: Orthopedics;  Laterality: Left;  needs 1 hour of time..   TONSILLECTOMY AND ADENOIDECTOMY  child   TOTAL KNEE ARTHROPLASTY  12-14-2009   RIGHT   TOTAL KNEE ARTHROPLASTY Left 11/05/2012   Procedure: LEFT TOTAL KNEE ARTHROPLASTY;  Surgeon: Loanne Drilling, MD;  Location: [facility];  Service: Orthopedics;  Laterality: Left;   TOTAL KNEE REVISION Right 07/29/2016   Procedure: RIGHT KNEE POLY-LINER EXCHANGE;  Surgeon: Kathryne Hitch, MD;  Location: [facility];  Service: Orthopedics;  Laterality: Right;   TRIGGER FINGER RELEASE Right 02/21/2013   Procedure: RIGHT RING A-1 PULLEY RELEASE    (MINOR PROCEDURE) ;  Surgeon: Wyn Forster., MD;  Location[facility];  Service: Orthopedics;  Laterality: Right;   TRIGGER FINGER RELEASE Left 09/22/2016    Procedure: RELEASE TRIGGER FINGER LEFT INDEX FINGER;  Surgeon: Kathryne Hitch, MD;  Location: [facility] OR;  Service: Orthopedics;  Laterality: Left;   TRIGGER FINGER RELEASE Right 01/20/2017   Procedure: RELEASE TRIGGER FINGER/A-1 PULLEY RIGHT INDEX FINGER;  Surgeon: Marcene Corning, MD;  Location: [facility];  Service: Orthopedics;  Laterality: Right;   VITRECTOMY 25 GAUGE WITH SCLERAL BUCKLE Right 03/20/2018   Procedure: RIGHT EYE VITRECTOMY WITH  ENDOLASER PARENTAL PHOTOCOAGULATION 25 GAUGE;  Surgeon: Carmela Rima, MD;  Location: [facility] OR;  Service: Ophthalmology;  Laterality: Right;   WRIST ARTHROSCOPY WITH DEBRIDEMENT Right 05/01/2019   Procedure: WRIST ARTHROSCOPY WITH DEBRIDEMENT;  Surgeon: Dairl Ponder, MD;  Location: [facility];  Service: Orthopedics;  Laterality: Right;   Patient Active Problem List   Diagnosis Date Noted   Notalgia 08/03/2022   BMI 45.0-49.9, adult (HCC) 06/13/2022   Other secondary hypertension 06/13/2022   SOBOE (shortness of breath on exertion) 05/30/2022   Depression screening 05/30/2022   Morbid obesity (HCC) 05/30/2022   Class 3 severe obesity with serious comorbidity and body mass index (BMI) of 50.0 to 59.9 in adult [facility]) 05/30/2022   Paroxysmal atrial fibrillation (HCC) 10/22/2021   Hypercoagulable state due to paroxysmal atrial fibrillation (HCC) 10/22/2021   Radial head fracture, closed 06/16/2018   Closed fracture dislocation of elbow 06/16/2018   Other fatigue 07/18/2017   Shortness of breath on exertion 07/18/2017   Type 2 diabetes mellitus with retinopathy, with long-term current use of insulin (HCC) 07/18/2017   Vitamin D deficiency 07/18/2017   B12 nutritional deficiency 07/18/2017   Other specified hypothyroidism 07/18/2017   Acute pain of right knee 12/22/2016   Trigger index finger of left hand 09/22/2016   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 requiring isolated polyethylene liner  exchange (HCC) 07/29/2016   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) 04/14/2016   Diabetes (HCC) 01/08/2014   Essential hypertension 01/08/2014   Hyperlipidemia 01/08/2014   Postoperative anemia due to acute blood loss 11/22/2012   Hyponatremia 11/06/2012   OA (osteoarthritis) of knee 11/05/2012   Villonodular synovitis of knee 04/20/2011    PCP: Thana Ates, MD  REFERRING PROVIDER: Rodolph Bong, MD   REFERRING DIAG: M54.42,M54.41,G89.29 (ICD-10-CM) - Chronic bilateral low back pain with bilateral sciatica   Rationale for Evaluation and Treatment: Rehabilitation  THERAPY DIAG:  Other low back pain  Cramp and spasm  Muscle weakness (generalized)  ONSET DATE: "a couple of years", but started worsening in May 2024  SUBJECTIVE:                                                                                                                                                                                           SUBJECTIVE STATEMENT: Traction really help my back feel better.    Pt states she felt she did a lot during the weekend. Back pain is a lot. Pt states she did feel some increase in pain after aquatics last session. Pt notes her pain is not constant but it will grip her suddenly when she thinks she's doing great. Pt states she feels like she is compressed when she stands  PERTINENT HISTORY:  OA, A-Fib, DM, bilateral TKA, bilateral shoulder surgeries  PAIN:  Are you having pain? Yes: NPRS scale: 2/10 Pain location: bilat glutes, low back Pain description: pain radiates from buttocks down bilateral LE past knees (Left more involved than Right) Aggravating factors: coughing, prolonged sitting and standing  Relieving factors: injection , sleep, weight shifting    PRECAUTIONS: None  RED FLAGS: None   WEIGHT BEARING RESTRICTIONS: No  FALLS:  Has patient fallen in last 6 months? No  LIVING ENVIRONMENT: Lives with: lives alone Lives in:  House/apartment Stairs: No Has following equipment at home: Single point cane  OCCUPATION: Works in admissions in [employer]  PLOF: Independent and Leisure: crafting  PATIENT GOALS: To be able to walk without hunching over and less pain.  NEXT MD VISIT: as needed  OBJECTIVE:   DIAGNOSTIC FINDINGS:  Lumbar MRI on 08/28/2022: IMPRESSION: 1. Lumbar spine spondylosis as described above. 2. No acute osseous injury of the lumbar spine.  Lumbar Radiograph on 08/09/2022: IMPRESSION: Multilevel degenerative changes of the lumbar spine as discussed above.  PATIENT SURVEYS:  Eval:  FOTO 40 (projected 56 by visit 12)  COGNITION: Overall cognitive status: Within functional limits for tasks assessed     SENSATION: WFL  MUSCLE LENGTH: Hamstrings: Tightness bilaterally   POSTURE: rounded shoulders, forward head, increased lumbar lordosis, increased thoracic kyphosis, and posterior pelvic tilt   LUMBAR ROM:  Some decreased motion with pain  LOWER EXTREMITY ROM:   WFL  LOWER EXTREMITY MMT:   Eval:  Bilateral LE strength grossly 4/5 throughout with some pain noted  LUMBAR SPECIAL TESTS:  Slump test: Positive  FUNCTIONAL TESTS:  09/22/2022: 5 times sit to stand: 10.15 sec Timed up and go (TUG): 12.84 sec  GAIT: Distance walked: 200 ft Assistive device utilized: None Level of assistance: Complete Independence Comments: Pt with antalgic gait  TODAY'S TREATMENT:    10/19/22: Patient seen for aquatic therapy today.  Treatment took place in water 2.5-4 feet deep  depending upon activity.  Pt entered the pool via stairs with moderate use of railings, caution used. Pt requires buoyancy of water for support and to offload joints with strengthening exercises.    * walking forward/ backward  * side stepping with slight lunges with arm addct with rainbow hand floats x 4 widths *Core engagement: rainbow HB carry forward and back x 2 widths each. No discomfort.  - yellow hb unilateral  submersion forward and backward amb  *L stretch on bottom step x 3: L stretch with wag the tail *standing figure 4 stretch * TrA set with solid noodle -> yellow hand buoys wide stance x10; staggered x 10 rep. (Improved from solid noodle) * decompression using yellow noodle: cycling in position;add/abd * holding wall: 1/2 diamonds x 10 R/L *seated on noodle: posterior pelvic tilting. VC and demonstration for execution    Pt requires the buoyancy and hydrostatic pressure of water for support, and to offload joints by unweighting joint load by at least 50 % in navel deep water and by at least 75-80% in chest to neck deep water.  Viscosity of the water is needed for resistance of strengthening. Water current perturbations provides challenge to standing balance requiring increased core activation.  10/17/22: Therex Nustep L5 x 5 min UEs/LEs Sitting figure 4 stretch x1 min Sitting piriformis stretch x 1 min Manual therapy STM & TPR bilat glutes, lumbar paraspinals Skilled assessment and palpation for TPDN Trigger Point Dry-Needling  Treatment instructions: Expect mild to moderate muscle soreness. S/S of pneumothorax if dry needled over a lung field, and to seek immediate medical attention should they occur. Patient verbalized understanding of these instructions and education. Patient Consent Given: Yes Education handout provided: Previously provided Muscles treated: bilat glute max, glute med Electrical stimulation performed: No Parameters: N/A Treatment response/outcome: Twitch response, decreased muscle tension Modalities Lumbar traction x 15 min; 125lb max, 45lb min, 50% speed   10/12/22: Patient seen for aquatic therapy today.  Treatment took place in water 2.5-4 feet deep depending upon activity.  Pt entered the pool via stairs with moderate use of railings, caution used. Pt requires buoyancy of water for support and to offload joints with strengthening exercises.    * walking forward/  backward - then with added row motion with rainbow hand floats * side stepping with slight lunges with arm addct with rainbow hand floats x 4 widths *L stretch on bottom step x 3: L stretch with wag the tail * holding wall: hip openers (hip abdct/knee flex)x 10 * TrA set with solid noodle wide stance then staggered x 10 rep. (Improved from 1/2 noodle) *Core engagement: rainbow HB carry forward and back x 2 widths each.  Increased depth for I proved toleration as pt reports slight discomfort in LB with shallower submersion * decompression using yellow noodle: then cycling in position.  Pt requires the buoyancy and hydrostatic pressure of water for support, and to offload joints by unweighting joint load by at least 50 % in navel deep water and by at least 75-80% in chest to neck deep water.  Viscosity of the water is needed for resistance of strengthening. Water current perturbations provides challenge to standing balance requiring increased core activation.    10/10/22: Nustep x 5 min L1 Seated pball roll out 5x10 sec, with lateral flexion x20 sec L stretch at counter x20 sec Seated hamstring stretch x 30 sec Supine LTR x10 Supine PPT + diaphragmatic breaths x10, with ball squeeze x10 Supine PPT + diaphragmatic breaths +  glute set x10 Supine PPT + diaphragmatic breaths + bent knee fall outs blue TB x10 Supine feet on pball roll out maintaining core contraction 2x10 Seated shoulder ER 2x10 Seated W 2x10  10/04/22: Patient seen for aquatic therapy today.  Treatment took place in water 2.5-4 feet deep depending upon activity.  Pt entered the pool via stairs with moderate use of railings, caution used. Pt requires buoyancy of water for support and to offload joints with strengthening exercises.    * walking forward/ backward - then with added row motion with rainbow hand floats * side stepping with arm addct with rainbow hand floats -> 2nd lap with mini side squat   * relaxed squat at wall; L  stretch with wag the tail * holding wall: hip openers (hip abdct/knee flex); hip crosses (hip addct/knee flex); single leg clams ,alternating LEs;  hip circles "draw circle on ground with toes";  L stretch with feet on wall  * return to walking forward/ backward with reciprocal arm swing * TrA set with short blue noodle pull down to thighs with 2 isometric holds on return to surface * straddling noodle:  cycling with gentle breast stroke arms   09/29/22: Nustep x 5 min level 3 (PT present to discuss status) Standing hamstring stretch 3 x 30 sec each LE (in room 13 with table lowered to lowest position) Standing quad stretch 3 x 30 sec each LE (in room 13 with table lowered to lowest position) Supine hook lying PPT x 20 Lower trunk rotation x 20 PPT with march x 20 PPT with alternating arms and legs x 20 PPT with clam x 20 with blue loop  Sidelying clam  09/28/22: Patient seen for aquatic therapy today.  Treatment took place in water 2.5-4 feet deep depending upon activity.  Pt entered the pool via stairs with moderate use of railings, caution used. Pt requires buoyancy of water for support and to offload joints with strengthening exercises.  Seated water bench with 75% submersion Pt performed seated LE AROM exercises 20x in all planes with concurrent discussion of current status, pain, [facility] and lastly water principles and how she can better utilize them. Pt verbally understood all principles and concepts. 75% water depth for water walking 6 lengths with UE single buoy weights for arm movements. Same weights for arm exercises with focus on core stabilization including: arm abd/add 10x, horizontal abd/add 10x, and shoulder ext/flexion 10x. Bicep and tricep curls with mitten hands 2x10. Standing core /lat press with single buoy UE wt 10x with VC to contract her glutes.                                                                                                                             DATE: 09/22/2022  Review of HEP:  hamstring stretch, marching, hip abduction, hip extension Seated blue pball rollout 5x5 sec  PATIENT EDUCATION:  Education details: aquatic therapy modifications  Person educated: Patient Education method: Medical illustrator and Handouts Education comprehension: verbalized understanding  HOME EXERCISE PROGRAM: Access Code: [email or web address] URL: [email or web address] Date: 10/10/2022 Prepared by: Vernon Prey April Kirstie Peri  Exercises - Seated Hamstring Stretch  - 1 x daily - 7 x weekly - 2 reps - 20 hold - Standing March with Counter Support  - 1 x daily - 7 x weekly - 2 sets - 10 reps - Standing Hip Abduction with Unilateral Counter Support  - 1 x daily - 7 x weekly - 2 sets - 10 reps - Standing Hip Extension with Unilateral Counter Support  - 1 x daily - 7 x weekly - 2 sets - 10 reps - Standing 'L' Stretch at Counter  - 1 x daily - 7 x weekly - 2 sets - 30 sec hold - Posterior Pelvic Tilt with Adduction Using Ball in Supine  - 1 x daily - 7 x weekly - 2 sets - 10 reps - Hooklying Clamshell with Resistance  - 1 x daily - 7 x weekly - 2 sets - 10 reps  ASSESSMENT:  CLINICAL IMPRESSION: Pt reports improvement in pain after last land session with DN and lumbar traction. She tolerates further progression of resistance with core strengthening.  Does report some hamstring and glute tightness with engagement today and pain in mid lower back which despite efforts to stretch/ease were unsuccessful.  Pain slightly increased at end of session by 1 NPRS.  Goals ongoing.   OBJECTIVE IMPAIRMENTS: Abnormal gait, decreased activity tolerance, decreased balance, decreased coordination, decreased endurance, decreased mobility, difficulty walking, decreased ROM, decreased strength, impaired flexibility, improper body mechanics, postural dysfunction, obesity, and pain.   ACTIVITY LIMITATIONS: carrying, lifting, bending, sitting,  standing, squatting, and locomotion level  PARTICIPATION LIMITATIONS: cleaning, laundry, community activity, and occupation  PERSONAL FACTORS: Age, Fitness, Profession, and 3+ comorbidities: OA, Bilat TKE, DM, A-Fib  are also affecting patient's functional outcome.   REHAB POTENTIAL: Good  CLINICAL DECISION MAKING: Evolving/moderate complexity  EVALUATION COMPLEXITY: Moderate   GOALS: Goals reviewed with patient? Yes  SHORT TERM GOALS: Target date: 10/14/2022  Patient will demonstrate competence with basic HEP. Baseline: Goal status: INITIAL  2.  Patient will perform a 6 MWT test to receive a baseline measurement to assess aerobic endurance and to set prolonged walking goals.  Baseline: patient shared they avoid prolonged walking due to pain  Goal status: INITIAL  3.  Patient will demonstrate proper technique when lifting light weight (5-10#) objects off of the floor without increased pain.  Baseline: MOI [place] at airport  Goal status: INITIAL  4.  Patient will demonstrate proper log roll technique in and out bed to avoid increased pain. Baseline: transferring was difficult  Goal status: INITIAL   LONG TERM GOALS: Target date: 11/18/2022  Patient will perform advanced HEP independently upon discharge.  Baseline:  Goal status: INITIAL  2.  Patient will decrease TUG time to no greater than 10 sec in order to decrease the risk of future falls.  Baseline: 12.84 sec Goal status: INITIAL  3.  Patient will improve their FOTO score to 56% in order to participate in ADLs and IADLs without increased pain.  Baseline: 40% Goal status: INITIAL  4.  Patient will be able to walk for 20 minutes without increased pain for community activity, such as grocery shopping.  Baseline: patient shared that they have trouble walking to and from the bathroom at work  Goal status: INITIAL  5.  Patient will increase global  LE strength to at least 4+ to 5-/5 required for getting on  and off chair at work.  Baseline: 4/5, patient reported fear of falling  Goal status: INITIAL   PLAN:  PT FREQUENCY: 1-2x/week  PT DURATION: 8 weeks  PLANNED INTERVENTIONS: Therapeutic exercises, Therapeutic activity, Neuromuscular re-education, Balance training, Gait training, Patient/Family education, Self Care, Joint mobilization, Stair training, Aquatic Therapy, Dry Needling, Electrical stimulation, Spinal manipulation, Spinal mobilization, Cryotherapy, Moist heat, Taping, Traction, Ultrasound, Ionotophoresis 4mg /ml Dexamethasone, Manual therapy, and Re-evaluation.  PLAN FOR NEXT SESSION: Aquatic and land therapy for LE flexibility and core strengthening. Check STGs on next land visit!    Corrie Dandy Tomma Lightning)  MPT 10/19/22 5:36 PM

## 2022-10-24 ENCOUNTER — Ambulatory Visit: Payer: 59 | Admitting: Physical Therapy

## 2022-10-24 DIAGNOSIS — R252 Cramp and spasm: Secondary | ICD-10-CM

## 2022-10-24 DIAGNOSIS — M5459 Other low back pain: Secondary | ICD-10-CM

## 2022-10-24 DIAGNOSIS — M6281 Muscle weakness (generalized): Secondary | ICD-10-CM

## 2022-10-24 NOTE — Therapy (Signed)
OUTPATIENT PHYSICAL THERAPY THORACOLUMBAR Treatment Note   Patient Name: [name] MRN: [id number] DOB:02/17/1951, 72 y.o., female Today's Date: 10/24/2022  END OF SESSION:  PT End of Session - 10/24/22 1449     Visit Number 9    Date for PT Re-Evaluation 11/18/22    Authorization Type [employer]    Progress Note Due on Visit 10    PT Start Time 1447    PT Stop Time 1530    PT Time Calculation (min) 43 min    Activity Tolerance Patient tolerated treatment well    Behavior During Therapy WFL for tasks assessed/performed              Past Medical History:  Diagnosis Date   A-fib (HCC)    Anemia    hx of   Arthritis    Chest pain    Constipation    Depression    Depression    Diabetes mellitus ORAL MEDS   Diabetic retinopathy (HCC)    Diabetic retinopathy (HCC)    Dyspnea    with minimal exertion-deconditioned   Dyspnea    Dysrhythmia    a-fib   Food allergy    GERD (gastroesophageal reflux disease)    occasional   Gout    Heartburn    History of kidney stones    Hypercholesteremia    Hyperlipidemia    Hypertension    Hypertensive kidney disease    Hypothyroidism    Joint pain    Kidney problem    Knee pain, right    Left arm numbness DUE TO CERVICAL PINCHED NERVE   Obesity    OSA on CPAP    uses CPAP nightly   Osteoarthritis    Osteoarthritis    Palpitations    Pinched nerve in neck    Pleurisy    Pneumonia    hx of   PONV (postoperative nausea and vomiting)    for 3-4 days after general anesthesia approx 10-12 years ago   Sciatica    Shortness of breath    Sleep apnea    Spinal stenosis    Swelling of both lower extremities    Swelling of knee joint, right    Synovitis of knee RIGHT   Trigger finger, left    left index   Vitamin D deficiency    Past Surgical History:  Procedure Laterality Date   CESAREAN SECTION  X3   KNEE ARTHROSCOPY  04/20/2011   Procedure: ARTHROSCOPY KNEE;  Surgeon: Loanne Drilling, MD;  Location: [facility] [place];  Service: Orthopedics;  Laterality: Right;  WITH SYNOVECTOMY   LEFT CARPAL TUNNEL / LEFT MIDDLE & RING FINGER TRIGGER RELEASE  08-26-2008   LEFT SHOULDER ARTHROSCOPY W/ DEBRIDEMENT  09-09-2003   LEFT SHOULDER ARTHROSCOPY/ LEFT THUMB TRIGGER RELEASE  02-22-2005   PHOTOCOAGULATION WITH LASER Right 03/20/2018   Procedure: PHOTOCOAGULATION WITH LASER;  Surgeon: Carmela Rima, MD;  Location: [facility] OR;  Service: Ophthalmology;  Laterality: Right;   PULLEY RELEASE LEFT LONG FINGER  07-14-2009   RADIAL HEAD ARTHROPLASTY Right 06/17/2018   Procedure: RADIAL HEAD ARTHROPLASTY;  Surgeon: Bjorn Pippin, MD;  Location: [facility] OR;  Service: Orthopedics;  Laterality: Right;   RIGHT CARPAL TUNNEL/ RIGHT THUMB TRIGGER RELEASE'S  11-28-2006   RIGHT SHOULDER ARTHROSCOPY W/ ROTATOR CUFF REPAIR  01-13-2004   SHOULDER ARTHROSCOPY DISTAL CLAVICLE EXCISION AND OPEN ROTATOR CUFF REPAIR  09-07-2004   LEFT   SHOULDER ARTHROSCOPY W/ ACROMIAL REPAIR  11-29-2005   LEFT  SYNOVECTOMY Left 06/20/2022   Procedure: left index finger and left long finger metacarpal phalangel joint syonvectomy;  Surgeon: Dairl Ponder, MD;  Location: [facility] OR;  Service: Orthopedics;  Laterality: Left;  needs 1 hour of time..   TONSILLECTOMY AND ADENOIDECTOMY  child   TOTAL KNEE ARTHROPLASTY  12-14-2009   RIGHT   TOTAL KNEE ARTHROPLASTY Left 11/05/2012   Procedure: LEFT TOTAL KNEE ARTHROPLASTY;  Surgeon: Loanne Drilling, MD;  Location: [facility];  Service: Orthopedics;  Laterality: Left;   TOTAL KNEE REVISION Right 07/29/2016   Procedure: RIGHT KNEE POLY-LINER EXCHANGE;  Surgeon: Kathryne Hitch, MD;  Location: [facility];  Service: Orthopedics;  Laterality: Right;   TRIGGER FINGER RELEASE Right 02/21/2013   Procedure: RIGHT RING A-1 PULLEY RELEASE    (MINOR PROCEDURE) ;  Surgeon: Wyn Forster., MD;  Location: [facility];  Service: Orthopedics;  Laterality: Right;   TRIGGER FINGER RELEASE Left 09/22/2016    Procedure: RELEASE TRIGGER FINGER LEFT INDEX FINGER;  Surgeon: Kathryne Hitch, MD;  Location: [facility] OR;  Service: Orthopedics;  Laterality: Left;   TRIGGER FINGER RELEASE Right 01/20/2017   Procedure: RELEASE TRIGGER FINGER/A-1 PULLEY RIGHT INDEX FINGER;  Surgeon: Marcene Corning, MD;  Location: [facility];  Service: Orthopedics;  Laterality: Right;   VITRECTOMY 25 GAUGE WITH SCLERAL BUCKLE Right 03/20/2018   Procedure: RIGHT EYE VITRECTOMY WITH  ENDOLASER PARENTAL PHOTOCOAGULATION 25 GAUGE;  Surgeon: Carmela Rima, MD;  Location[facility];  Service: Ophthalmology;  Laterality: Right;   WRIST ARTHROSCOPY WITH DEBRIDEMENT Right 05/01/2019   Procedure: WRIST ARTHROSCOPY WITH DEBRIDEMENT;  Surgeon: Dairl Ponder, MD;  Location:  SURGERY CENTER;  Service: Orthopedics;  Laterality: Right;   Patient Active Problem List   Diagnosis Date Noted   Notalgia 08/03/2022   BMI 45.0-49.9, adult (HCC) 06/13/2022   Other secondary hypertension 06/13/2022   SOBOE (shortness of breath on exertion) 05/30/2022   Depression screening 05/30/2022   Morbid obesity (HCC) 05/30/2022   Class 3 severe obesity with serious comorbidity and body mass index (BMI) of 50.0 to 59.9 in adult [facility]) 05/30/2022   Paroxysmal atrial fibrillation (HCC) 10/22/2021   Hypercoagulable state due to paroxysmal atrial fibrillation (HCC) 10/22/2021   Radial head fracture, closed 06/16/2018   Closed fracture dislocation of elbow 06/16/2018   Other fatigue 07/18/2017   Shortness of breath on exertion 07/18/2017   Type 2 diabetes mellitus with retinopathy, with long-term current use of insulin (HCC) 07/18/2017   Vitamin D deficiency 07/18/2017   B12 nutritional deficiency 07/18/2017   Other specified hypothyroidism 07/18/2017   Acute pain of right knee 12/22/2016   Trigger index finger of left hand 09/22/2016   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 requiring isolated polyethylene liner  exchange (HCC) 07/29/2016   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) 04/14/2016   Diabetes (HCC) 01/08/2014   Essential hypertension 01/08/2014   Hyperlipidemia 01/08/2014   Postoperative anemia due to acute blood loss 11/22/2012   Hyponatremia 11/06/2012   OA (osteoarthritis) of knee 11/05/2012   Villonodular synovitis of knee 04/20/2011    PCP: Thana Ates, MD  REFERRING PROVIDER: Rodolph Bong, MD   REFERRING DIAG: M54.42,M54.41,G89.29 (ICD-10-CM) - Chronic bilateral low back pain with bilateral sciatica   Rationale for Evaluation and Treatment: Rehabilitation  THERAPY DIAG:  Other low back pain  Cramp and spasm  Muscle weakness (generalized)  ONSET DATE: "a couple of years", but started worsening in May 2024  SUBJECTIVE:  SUBJECTIVE STATEMENT: Pt states she had good relief after last session. Lasted ~2 days. Was sore after aquatic therapy. Pt reports she was feeling good this morning but turned the wrong way and flared up her back.   PERTINENT HISTORY:  OA, A-Fib, DM, bilateral TKA, bilateral shoulder surgeries  PAIN:  Are you having pain? Yes: NPRS scale: 5/10 Pain location: bilat glutes, low back Pain description: pain radiates from buttocks down bilateral LE past knees (Left more involved than Right) Aggravating factors: coughing, prolonged sitting and standing  Relieving factors: injection , sleep, weight shifting    PRECAUTIONS: None  WEIGHT BEARING RESTRICTIONS: No  FALLS:  Has patient fallen in last 6 months? No  LIVING ENVIRONMENT: Lives with: lives alone Lives in: House/apartment Stairs: No Has following equipment at home: Single point cane  OCCUPATION: Works in admissions in [employer]  PLOF: Independent and Leisure: crafting  PATIENT GOALS: To  be able to walk without hunching over and less pain.  NEXT MD VISIT: as needed  OBJECTIVE:   DIAGNOSTIC FINDINGS:  Lumbar MRI on 08/28/2022: IMPRESSION: 1. Lumbar spine spondylosis as described above. 2. No acute osseous injury of the lumbar spine.  Lumbar Radiograph on 08/09/2022: IMPRESSION: Multilevel degenerative changes of the lumbar spine as discussed above.  PATIENT SURVEYS:  Eval:  FOTO 40 (projected 56 by visit 12)  COGNITION: Overall cognitive status: Within functional limits for tasks assessed     SENSATION: WFL  MUSCLE LENGTH: Hamstrings: Tightness bilaterally   POSTURE: rounded shoulders, forward head, increased lumbar lordosis, increased thoracic kyphosis, and posterior pelvic tilt   LUMBAR ROM:  Some decreased motion with pain  LOWER EXTREMITY ROM:   WFL  LOWER EXTREMITY MMT:   Eval:  Bilateral LE strength grossly 4/5 throughout with some pain noted  LUMBAR SPECIAL TESTS:  Slump test: Positive  FUNCTIONAL TESTS:  09/22/2022: 5 times sit to stand: 10.15 sec Timed up and go (TUG): 12.84 sec  GAIT: Distance walked: 200 ft Assistive device utilized: None Level of assistance: Complete Independence Comments: Pt with antalgic gait  TODAY'S TREATMENT:    10/24/22: Therex Nustep L6 x 6 min Sidelying clamshell 2x10  Therapeutic Activty Side roll L<>R -- remains painful when bridging up to make space to turn Manual therapy STM & TPR bilat glutes, lumbar paraspinals Skilled assessment and palpation for TPDN Trigger Point Dry-Needling  Treatment instructions: Expect mild to moderate muscle soreness. S/S of pneumothorax if dry needled over a lung field, and to seek immediate medical attention should they occur. Patient verbalized understanding of these instructions and education. Patient Consent Given: Yes Education handout provided: Previously provided Muscles treated: bilat glute max, glute med Electrical stimulation performed: No Parameters:  N/A Treatment response/outcome: Twitch response, decreased muscle tension Modalities Lumbar traction x 15 min; 125lb max, 45lb min, 50% speed  10/19/22: Patient seen for aquatic therapy today.  Treatment took place in water 2.5-4 feet deep depending upon activity.  Pt entered the pool via stairs with moderate use of railings, caution used. Pt requires buoyancy of water for support and to offload joints with strengthening exercises.    * walking forward/ backward  * side stepping with slight lunges with arm addct with rainbow hand floats x 4 widths *Core engagement: rainbow HB carry forward and back x 2 widths each. No discomfort.  - yellow hb unilateral submersion forward and backward amb  *L stretch on bottom step x 3: L stretch with wag the tail *standing figure 4 stretch * TrA set with  solid noodle -> yellow hand buoys wide stance x10; staggered x 10 rep. (Improved from solid noodle) * decompression using yellow noodle: cycling in position;add/abd * holding wall: 1/2 diamonds x 10 R/L *seated on noodle: posterior pelvic tilting. VC and demonstration for execution    Pt requires the buoyancy and hydrostatic pressure of water for support, and to offload joints by unweighting joint load by at least 50 % in navel deep water and by at least 75-80% in chest to neck deep water.  Viscosity of the water is needed for resistance of strengthening. Water current perturbations provides challenge to standing balance requiring increased core activation.  10/17/22: Therex Nustep L5 x 5 min UEs/LEs Sitting figure 4 stretch x1 min Sitting piriformis stretch x 1 min Manual therapy STM & TPR bilat glutes, lumbar paraspinals Skilled assessment and palpation for TPDN Trigger Point Dry-Needling  Treatment instructions: Expect mild to moderate muscle soreness. S/S of pneumothorax if dry needled over a lung field, and to seek immediate medical attention should they occur. Patient verbalized understanding of  these instructions and education. Patient Consent Given: Yes Education handout provided: Previously provided Muscles treated: bilat glute max, glute med Electrical stimulation performed: No Parameters: N/A Treatment response/outcome: Twitch response, decreased muscle tension Modalities Lumbar traction x 15 min; 125lb max, 45lb min, 50% speed   10/12/22: Patient seen for aquatic therapy today.  Treatment took place in water 2.5-4 feet deep depending upon activity.  Pt entered the pool via stairs with moderate use of railings, caution used. Pt requires buoyancy of water for support and to offload joints with strengthening exercises.    * walking forward/ backward - then with added row motion with rainbow hand floats * side stepping with slight lunges with arm addct with rainbow hand floats x 4 widths *L stretch on bottom step x 3: L stretch with wag the tail * holding wall: hip openers (hip abdct/knee flex)x 10 * TrA set with solid noodle wide stance then staggered x 10 rep. (Improved from 1/2 noodle) *Core engagement: rainbow HB carry forward and back x 2 widths each.  Increased depth for I proved toleration as pt reports slight discomfort in LB with shallower submersion * decompression using yellow noodle: then cycling in position.  Pt requires the buoyancy and hydrostatic pressure of water for support, and to offload joints by unweighting joint load by at least 50 % in navel deep water and by at least 75-80% in chest to neck deep water.  Viscosity of the water is needed for resistance of strengthening. Water current perturbations provides challenge to standing balance requiring increased core activation.    10/10/22: Nustep x 5 min L1 Seated pball roll out 5x10 sec, with lateral flexion x20 sec L stretch at counter x20 sec Seated hamstring stretch x 30 sec Supine LTR x10 Supine PPT + diaphragmatic breaths x10, with ball squeeze x10 Supine PPT + diaphragmatic breaths + glute set  x10 Supine PPT + diaphragmatic breaths + bent knee fall outs blue TB x10 Supine feet on pball roll out maintaining core contraction 2x10 Seated shoulder ER 2x10 Seated W 2x10  10/04/22: Patient seen for aquatic therapy today.  Treatment took place in water 2.5-4 feet deep depending upon activity.  Pt entered the pool via stairs with moderate use of railings, caution used. Pt requires buoyancy of water for support and to offload joints with strengthening exercises.    * walking forward/ backward - then with added row motion with rainbow hand floats * side  stepping with arm addct with rainbow hand floats -> 2nd lap with mini side squat   * relaxed squat at wall; L stretch with wag the tail * holding wall: hip openers (hip abdct/knee flex); hip crosses (hip addct/knee flex); single leg clams ,alternating LEs;  hip circles "draw circle on ground with toes";  L stretch with feet on wall  * return to walking forward/ backward with reciprocal arm swing * TrA set with short blue noodle pull down to thighs with 2 isometric holds on return to surface * straddling noodle:  cycling with gentle breast stroke arms    PATIENT EDUCATION:  Education details: aquatic therapy modifications  Person educated: Patient Education method: Medical illustrator and Handouts Education comprehension: verbalized understanding  HOME EXERCISE PROGRAM: Access Code: [email or web address] URL: [email or web address] Date: 10/10/2022 Prepared by: Vernon Prey April Kirstie Peri  Exercises - Seated Hamstring Stretch  - 1 x daily - 7 x weekly - 2 reps - 20 hold - Standing March with Counter Support  - 1 x daily - 7 x weekly - 2 sets - 10 reps - Standing Hip Abduction with Unilateral Counter Support  - 1 x daily - 7 x weekly - 2 sets - 10 reps - Standing Hip Extension with Unilateral Counter Support  - 1 x daily - 7 x weekly - 2 sets - 10 reps - Standing 'L' Stretch at Counter  - 1 x daily - 7 x weekly - 2 sets - 30  sec hold - Posterior Pelvic Tilt with Adduction Using Ball in Supine  - 1 x daily - 7 x weekly - 2 sets - 10 reps - Hooklying Clamshell with Resistance  - 1 x daily - 7 x weekly - 2 sets - 10 reps  ASSESSMENT:  CLINICAL IMPRESSION: Pt with good response to TPDN and lumbar traction from last session -- pt would like to perform this again today. Continues to have multiple trigger points and pain in bilat glutes. Pt reports compliance to performing exercises at home. Will continue to progress pt as tolerated. Have not been able to progress pt's lifting since aquatics and very gentle core strengthening can still flare pt's pain.    OBJECTIVE IMPAIRMENTS: Abnormal gait, decreased activity tolerance, decreased balance, decreased coordination, decreased endurance, decreased mobility, difficulty walking, decreased ROM, decreased strength, impaired flexibility, improper body mechanics, postural dysfunction, obesity, and pain.   ACTIVITY LIMITATIONS: carrying, lifting, bending, sitting, standing, squatting, and locomotion level  PARTICIPATION LIMITATIONS: cleaning, laundry, community activity, and occupation  PERSONAL FACTORS: Age, Fitness, Profession, and 3+ comorbidities: OA, Bilat TKE, DM, A-Fib  are also affecting patient's functional outcome.   REHAB POTENTIAL: Good  CLINICAL DECISION MAKING: Evolving/moderate complexity  EVALUATION COMPLEXITY: Moderate   GOALS: Goals reviewed with patient? Yes  SHORT TERM GOALS: Target date: 10/14/2022  Patient will demonstrate competence with basic HEP. Baseline: Goal status: MET  2.  Patient will perform a 6 MWT test to receive a baseline measurement to assess aerobic endurance and to set prolonged walking goals.  Baseline: patient shared they avoid prolonged walking due to pain  Goal status: IN PROGRESS  3.  Patient will demonstrate proper technique when lifting light weight (5-10#) objects off of the floor without increased pain.  Baseline: MOI  [place] at airport  Goal status: IN PROGRESS  4.  Patient will demonstrate proper log roll technique in and out bed to avoid increased pain. Baseline: transferring was difficult  Goal status: IN PROGRESS   LONG TERM GOALS:  Target date: 11/18/2022  Patient will perform advanced HEP independently upon discharge.  Baseline:  Goal status: INITIAL  2.  Patient will decrease TUG time to no greater than 10 sec in order to decrease the risk of future falls.  Baseline: 12.84 sec Goal status: INITIAL  3.  Patient will improve their FOTO score to 56% in order to participate in ADLs and IADLs without increased pain.  Baseline: 40% Goal status: INITIAL  4.  Patient will be able to walk for 20 minutes without increased pain for community activity, such as grocery shopping.  Baseline: patient shared that they have trouble walking to and from the bathroom at work  Goal status: INITIAL  5.  Patient will increase global LE strength to at least 4+ to 5-/5 required for getting on and off chair at work.  Baseline: 4/5, patient reported fear of falling  Goal status: INITIAL   PLAN:  PT FREQUENCY: 1-2x/week  PT DURATION: 8 weeks  PLANNED INTERVENTIONS: Therapeutic exercises, Therapeutic activity, Neuromuscular re-education, Balance training, Gait training, Patient/Family education, Self Care, Joint mobilization, Stair training, Aquatic Therapy, Dry Needling, Electrical stimulation, Spinal manipulation, Spinal mobilization, Cryotherapy, Moist heat, Taping, Traction, Ultrasound, Ionotophoresis 4mg /ml Dexamethasone, Manual therapy, and Re-evaluation.  PLAN FOR NEXT SESSION: Aquatic and land therapy for LE flexibility and core strengthening. Work on walking tolerance if able.  Focus on hip strengthening as tolerated.    April Ma L , PT 10/24/22 2:50 PM

## 2022-10-25 ENCOUNTER — Ambulatory Visit (HOSPITAL_BASED_OUTPATIENT_CLINIC_OR_DEPARTMENT_OTHER): Payer: 59 | Admitting: Physical Therapy

## 2022-10-25 ENCOUNTER — Encounter (HOSPITAL_BASED_OUTPATIENT_CLINIC_OR_DEPARTMENT_OTHER): Payer: Self-pay | Admitting: Physical Therapy

## 2022-10-25 DIAGNOSIS — R252 Cramp and spasm: Secondary | ICD-10-CM

## 2022-10-25 DIAGNOSIS — M5459 Other low back pain: Secondary | ICD-10-CM | POA: Diagnosis not present

## 2022-10-25 DIAGNOSIS — M6281 Muscle weakness (generalized): Secondary | ICD-10-CM

## 2022-10-25 NOTE — Therapy (Signed)
OUTPATIENT PHYSICAL THERAPY THORACOLUMBAR TREATMENT   Patient Name: [name] MRN: [id number] DOB:1951/01/04, 72 y.o., female Today's Date: 10/25/2022  END OF SESSION:  PT End of Session - 10/25/22 1529     Visit Number 10    Date for PT Re-Evaluation 11/18/22    Authorization Type [employer]    Progress Note Due on Visit 19    PT Start Time 1530    PT Stop Time 1610    PT Time Calculation (min) 40 min    Behavior During Therapy WFL for tasks assessed/performed              Past Medical History:  Diagnosis Date   A-fib (HCC)    Anemia    hx of   Arthritis    Chest pain    Constipation    Depression    Depression    Diabetes mellitus ORAL MEDS   Diabetic retinopathy (HCC)    Diabetic retinopathy (HCC)    Dyspnea    with minimal exertion-deconditioned   Dyspnea    Dysrhythmia    a-fib   Food allergy    GERD (gastroesophageal reflux disease)    occasional   Gout    Heartburn    History of kidney stones    Hypercholesteremia    Hyperlipidemia    Hypertension    Hypertensive kidney disease    Hypothyroidism    Joint pain    Kidney problem    Knee pain, right    Left arm numbness DUE TO CERVICAL PINCHED NERVE   Obesity    OSA on CPAP    uses CPAP nightly   Osteoarthritis    Osteoarthritis    Palpitations    Pinched nerve in neck    Pleurisy    Pneumonia    hx of   PONV (postoperative nausea and vomiting)    for 3-4 days after general anesthesia approx 10-12 years ago   Sciatica    Shortness of breath    Sleep apnea    Spinal stenosis    Swelling of both lower extremities    Swelling of knee joint, right    Synovitis of knee RIGHT   Trigger finger, left    left index   Vitamin D deficiency    Past Surgical History:  Procedure Laterality Date   CESAREAN SECTION  X3   KNEE ARTHROSCOPY  04/20/2011   Procedure: ARTHROSCOPY KNEE;  Surgeon: Loanne Drilling, MD;  Location: [facility] [place];  Service: Orthopedics;  Laterality:  Right;  WITH SYNOVECTOMY   LEFT CARPAL TUNNEL / LEFT MIDDLE & RING FINGER TRIGGER RELEASE  08-26-2008   LEFT SHOULDER ARTHROSCOPY W/ DEBRIDEMENT  09-09-2003   LEFT SHOULDER ARTHROSCOPY/ LEFT THUMB TRIGGER RELEASE  02-22-2005   PHOTOCOAGULATION WITH LASER Right 03/20/2018   Procedure: PHOTOCOAGULATION WITH LASER;  Surgeon: Carmela Rima, MD;  Location: [facility] OR;  Service: Ophthalmology;  Laterality: Right;   PULLEY RELEASE LEFT LONG FINGER  07-14-2009   RADIAL HEAD ARTHROPLASTY Right 06/17/2018   Procedure: RADIAL HEAD ARTHROPLASTY;  Surgeon: Bjorn Pippin, MD;  Location: [facility] OR;  Service: Orthopedics;  Laterality: Right;   RIGHT CARPAL TUNNEL/ RIGHT THUMB TRIGGER RELEASE'S  11-28-2006   RIGHT SHOULDER ARTHROSCOPY W/ ROTATOR CUFF REPAIR  01-13-2004   SHOULDER ARTHROSCOPY DISTAL CLAVICLE EXCISION AND OPEN ROTATOR CUFF REPAIR  09-07-2004   LEFT   SHOULDER ARTHROSCOPY W/ ACROMIAL REPAIR  11-29-2005   LEFT   SYNOVECTOMY Left 06/20/2022   Procedure: left index  finger and left long finger metacarpal phalangel joint syonvectomy;  Surgeon: Dairl Ponder, MD;  Location: [facility] OR;  Service: Orthopedics;  Laterality: Left;  needs 1 hour of time..   TONSILLECTOMY AND ADENOIDECTOMY  child   TOTAL KNEE ARTHROPLASTY  12-14-2009   RIGHT   TOTAL KNEE ARTHROPLASTY Left 11/05/2012   Procedure: LEFT TOTAL KNEE ARTHROPLASTY;  Surgeon: Loanne Drilling, MD;  Location: [facility];  Service: Orthopedics;  Laterality: Left;   TOTAL KNEE REVISION Right 07/29/2016   Procedure: RIGHT KNEE POLY-LINER EXCHANGE;  Surgeon: Kathryne Hitch, MD;  Location: [facility];  Service: Orthopedics;  Laterality: Right;   TRIGGER FINGER RELEASE Right 02/21/2013   Procedure: RIGHT RING A-1 PULLEY RELEASE    (MINOR PROCEDURE) ;  Surgeon: Wyn Forster., MD;  Location: [facility];  Service: Orthopedics;  Laterality: Right;   TRIGGER FINGER RELEASE Left 09/22/2016   Procedure: RELEASE TRIGGER FINGER LEFT INDEX FINGER;  Surgeon:  Kathryne Hitch, MD;  Location: [facility] OR;  Service: Orthopedics;  Laterality: Left;   TRIGGER FINGER RELEASE Right 01/20/2017   Procedure: RELEASE TRIGGER FINGER/A-1 PULLEY RIGHT INDEX FINGER;  Surgeon: Marcene Corning, MD;  Location: [facility];  Service: Orthopedics;  Laterality: Right;   VITRECTOMY 25 GAUGE WITH SCLERAL BUCKLE Right 03/20/2018   Procedure: RIGHT EYE VITRECTOMY WITH  ENDOLASER PARENTAL PHOTOCOAGULATION 25 GAUGE;  Surgeon: Carmela Rima, MD;  Location: [facility] OR;  Service: Ophthalmology;  Laterality: Right;   WRIST ARTHROSCOPY WITH DEBRIDEMENT Right 05/01/2019   Procedure: WRIST ARTHROSCOPY WITH DEBRIDEMENT;  Surgeon: Dairl Ponder, MD;  Location: [facility];  Service: Orthopedics;  Laterality: Right;   Patient Active Problem List   Diagnosis Date Noted   Notalgia 08/03/2022   BMI 45.0-49.9, adult (HCC) 06/13/2022   Other secondary hypertension 06/13/2022   SOBOE (shortness of breath on exertion) 05/30/2022   Depression screening 05/30/2022   Morbid obesity (HCC) 05/30/2022   Class 3 severe obesity with serious comorbidity and body mass index (BMI) of 50.0 to 59.9 in adult [facility]) 05/30/2022   Paroxysmal atrial fibrillation (HCC) 10/22/2021   Hypercoagulable state due to paroxysmal atrial fibrillation (HCC) 10/22/2021   Radial head fracture, closed 06/16/2018   Closed fracture dislocation of elbow 06/16/2018   Other fatigue 07/18/2017   Shortness of breath on exertion 07/18/2017   Type 2 diabetes mellitus with retinopathy, with long-term current use of insulin (HCC) 07/18/2017   Vitamin D deficiency 07/18/2017   B12 nutritional deficiency 07/18/2017   Other specified hypothyroidism 07/18/2017   Acute pain of right knee 12/22/2016   Trigger index finger of left hand 09/22/2016   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 requiring isolated polyethylene liner exchange (HCC) 07/29/2016   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) 04/14/2016   Diabetes (HCC) 01/08/2014   Essential hypertension 01/08/2014   Hyperlipidemia 01/08/2014   Postoperative anemia due to acute blood loss 11/22/2012   Hyponatremia 11/06/2012   OA (osteoarthritis) of knee 11/05/2012   Villonodular synovitis of knee 04/20/2011    PCP: Thana Ates, MD  REFERRING PROVIDER: Rodolph Bong, MD   REFERRING DIAG: M54.42,M54.41,G89.29 (ICD-10-CM) - Chronic bilateral low back pain with bilateral sciatica   Rationale for Evaluation and Treatment: Rehabilitation  THERAPY DIAG:  Other low back pain  Cramp and spasm  Muscle weakness (generalized)  ONSET DATE: "a couple of years", but started worsening in May 2024  SUBJECTIVE:  SUBJECTIVE STATEMENT: Pt reports she had increased pain in lower back after traction yesterday.  Pain has remained elevated today.  Had to use her rollater to walk to pool. She reports the epidural only gave her relief for 2 wks, but she would like follow up with Dr. Denyse Amass.   PERTINENT HISTORY:  OA, A-Fib, DM, bilateral TKA, bilateral shoulder surgeries  PAIN:  Are you having pain? Yes: NPRS scale: 4-5/10 Pain location: bilat glutes, low back Pain description: pain radiates from buttocks down bilateral LE past knees (Left more involved than Right) Aggravating factors: coughing, prolonged sitting and standing  Relieving factors: injection , sleep, weight shifting    PRECAUTIONS: None  WEIGHT BEARING RESTRICTIONS: No  FALLS:  Has patient fallen in last 6 months? No  LIVING ENVIRONMENT: Lives with: lives alone Lives in: House/apartment Stairs: No Has following equipment at home: Single point cane  OCCUPATION: Works in admissions in [employer]  PLOF: Independent and Leisure: crafting  PATIENT GOALS: To be  able to walk without hunching over and less pain.  NEXT MD VISIT: as needed  OBJECTIVE:   DIAGNOSTIC FINDINGS:  Lumbar MRI on 08/28/2022: IMPRESSION: 1. Lumbar spine spondylosis as described above. 2. No acute osseous injury of the lumbar spine.  Lumbar Radiograph on 08/09/2022: IMPRESSION: Multilevel degenerative changes of the lumbar spine as discussed above.  PATIENT SURVEYS:  Eval:  FOTO 40 (projected 56 by visit 12)  COGNITION: Overall cognitive status: Within functional limits for tasks assessed     SENSATION: WFL  MUSCLE LENGTH: Hamstrings: Tightness bilaterally   POSTURE: rounded shoulders, forward head, increased lumbar lordosis, increased thoracic kyphosis, and posterior pelvic tilt   LUMBAR ROM:  Some decreased motion with pain  LOWER EXTREMITY ROM:   WFL  LOWER EXTREMITY MMT:   Eval:  Bilateral LE strength grossly 4/5 throughout with some pain noted  LUMBAR SPECIAL TESTS:  Slump test: Positive  FUNCTIONAL TESTS:  09/22/2022: 5 times sit to stand: 10.15 sec Timed up and go (TUG): 12.84 sec  GAIT: Distance walked: 200 ft Assistive device utilized: None Level of assistance: Complete Independence Comments: Pt with antalgic gait  TODAY'S TREATMENT:    10/25/22: Patient seen for aquatic therapy today.  Treatment took place in water 2.5-4 feet deep depending upon activity.  Pt entered the pool via stairs with bilat railings. Pt requires buoyancy of water for support and to offload joints with strengthening exercises.    * walking forward/ backward, side stepping  * straddling noodle gentle skulling of arms:  cycling, hip abdct/ addct  * holding wall:  marching in place, hip openers, L stretch, (wag the tail not tolerated)  * staggered stance with horiz abdct/ addct (arms under water, no added resistance) * given information on TENS * after dried off:  applied reg Rock tape across L/R SI joints and 1 piece across sacrum with 20% stretch to decompress  tissue and increase proprioception.   10/24/22: Therex Nustep L6 x 6 min Sidelying clamshell 2x10  Therapeutic Activty Side roll L<>R -- remains painful when bridging up to make space to turn Manual therapy STM & TPR bilat glutes, lumbar paraspinals Skilled assessment and palpation for TPDN Trigger Point Dry-Needling  Treatment instructions: Expect mild to moderate muscle soreness. S/S of pneumothorax if dry needled over a lung field, and to seek immediate medical attention should they occur. Patient verbalized understanding of these instructions and education. Patient Consent Given: Yes Education handout provided: Previously provided Muscles treated: bilat glute max, glute med  Electrical stimulation performed: No Parameters: N/A Treatment response/outcome: Twitch response, decreased muscle tension Modalities Lumbar traction x 15 min; 125lb max, 45lb min, 50% speed  10/19/22: Patient seen for aquatic therapy today.  Treatment took place in water 2.5-4 feet deep depending upon activity.  Pt entered the pool via stairs with moderate use of railings, caution used. Pt requires buoyancy of water for support and to offload joints with strengthening exercises.    * walking forward/ backward  * side stepping with slight lunges with arm addct with rainbow hand floats x 4 widths *Core engagement: rainbow HB carry forward and back x 2 widths each. No discomfort.  - yellow hb unilateral submersion forward and backward amb  *L stretch on bottom step x 3: L stretch with wag the tail *standing figure 4 stretch * TrA set with solid noodle -> yellow hand buoys wide stance x10; staggered x 10 rep. (Improved from solid noodle) * decompression using yellow noodle: cycling in position;add/abd * holding wall: 1/2 diamonds x 10 R/L *seated on noodle: posterior pelvic tilting. VC and demonstration for execution    Pt requires the buoyancy and hydrostatic pressure of water for support, and to offload  joints by unweighting joint load by at least 50 % in navel deep water and by at least 75-80% in chest to neck deep water.  Viscosity of the water is needed for resistance of strengthening. Water current perturbations provides challenge to standing balance requiring increased core activation.  10/17/22: Therex Nustep L5 x 5 min UEs/LEs Sitting figure 4 stretch x1 min Sitting piriformis stretch x 1 min Manual therapy STM & TPR bilat glutes, lumbar paraspinals Skilled assessment and palpation for TPDN Trigger Point Dry-Needling  Treatment instructions: Expect mild to moderate muscle soreness. S/S of pneumothorax if dry needled over a lung field, and to seek immediate medical attention should they occur. Patient verbalized understanding of these instructions and education. Patient Consent Given: Yes Education handout provided: Previously provided Muscles treated: bilat glute max, glute med Electrical stimulation performed: No Parameters: N/A Treatment response/outcome: Twitch response, decreased muscle tension Modalities Lumbar traction x 15 min; 125lb max, 45lb min, 50% speed   10/12/22: Patient seen for aquatic therapy today.  Treatment took place in water 2.5-4 feet deep depending upon activity.  Pt entered the pool via stairs with moderate use of railings, caution used. Pt requires buoyancy of water for support and to offload joints with strengthening exercises.    * walking forward/ backward - then with added row motion with rainbow hand floats * side stepping with slight lunges with arm addct with rainbow hand floats x 4 widths *L stretch on bottom step x 3: L stretch with wag the tail * holding wall: hip openers (hip abdct/knee flex)x 10 * TrA set with solid noodle wide stance then staggered x 10 rep. (Improved from 1/2 noodle) *Core engagement: rainbow HB carry forward and back x 2 widths each.  Increased depth for I proved toleration as pt reports slight discomfort in LB with shallower  submersion * decompression using yellow noodle: then cycling in position.  Pt requires the buoyancy and hydrostatic pressure of water for support, and to offload joints by unweighting joint load by at least 50 % in navel deep water and by at least 75-80% in chest to neck deep water.  Viscosity of the water is needed for resistance of strengthening. Water current perturbations provides challenge to standing balance requiring increased core activation.    10/10/22: Nustep x 5 min  L1 Seated pball roll out 5x10 sec, with lateral flexion x20 sec L stretch at counter x20 sec Seated hamstring stretch x 30 sec Supine LTR x10 Supine PPT + diaphragmatic breaths x10, with ball squeeze x10 Supine PPT + diaphragmatic breaths + glute set x10 Supine PPT + diaphragmatic breaths + bent knee fall outs blue TB x10 Supine feet on pball roll out maintaining core contraction 2x10 Seated shoulder ER 2x10 Seated W 2x10  10/04/22: Patient seen for aquatic therapy today.  Treatment took place in water 2.5-4 feet deep depending upon activity.  Pt entered the pool via stairs with moderate use of railings, caution used. Pt requires buoyancy of water for support and to offload joints with strengthening exercises.    * walking forward/ backward - then with added row motion with rainbow hand floats * side stepping with arm addct with rainbow hand floats -> 2nd lap with mini side squat   * relaxed squat at wall; L stretch with wag the tail * holding wall: hip openers (hip abdct/knee flex); hip crosses (hip addct/knee flex); single leg clams ,alternating LEs;  hip circles "draw circle on ground with toes";  L stretch with feet on wall  * return to walking forward/ backward with reciprocal arm swing * TrA set with short blue noodle pull down to thighs with 2 isometric holds on return to surface * straddling noodle:  cycling with gentle breast stroke arms    PATIENT EDUCATION:  Education details: aquatic therapy  modifications  Person educated: Patient Education method: Medical illustrator and Handouts Education comprehension: verbalized understanding  HOME EXERCISE PROGRAM: Access Code: [email or web address] URL: [email or web address] Date: 10/10/2022 Prepared by: Vernon Prey April Kirstie Peri  Exercises - Seated Hamstring Stretch  - 1 x daily - 7 x weekly - 2 reps - 20 hold - Standing March with Counter Support  - 1 x daily - 7 x weekly - 2 sets - 10 reps - Standing Hip Abduction with Unilateral Counter Support  - 1 x daily - 7 x weekly - 2 sets - 10 reps - Standing Hip Extension with Unilateral Counter Support  - 1 x daily - 7 x weekly - 2 sets - 10 reps - Standing 'L' Stretch at Counter  - 1 x daily - 7 x weekly - 2 sets - 30 sec hold - Posterior Pelvic Tilt with Adduction Using Ball in Supine  - 1 x daily - 7 x weekly - 2 sets - 10 reps - Hooklying Clamshell with Resistance  - 1 x daily - 7 x weekly - 2 sets - 10 reps  Access Code: [email or web address] URL: [email or web address] Date: 10/25/2022 Prepared by: [facility] [address]  Patient Education - TENS Unit - TENS Therapy  ASSESSMENT:  CLINICAL IMPRESSION: Pt arrives with elevated pain and increased pain sensitivity.  Pt reported mild reduction of low back pain with suspended cycling, but pain returned once feet on ground in water.  Trial of rock tape to area of pain (at SI joints).  Encouraged self care (ie: ice) this afternoon.  No goals met this date due to flare up.   OBJECTIVE IMPAIRMENTS: Abnormal gait, decreased activity tolerance, decreased balance, decreased coordination, decreased endurance, decreased mobility, difficulty walking, decreased ROM, decreased strength, impaired flexibility, improper body mechanics, postural dysfunction, obesity, and pain.   ACTIVITY LIMITATIONS: carrying, lifting, bending, sitting, standing, squatting, and locomotion level  PARTICIPATION LIMITATIONS: cleaning,  laundry, community activity, and occupation  PERSONAL FACTORS: Age, Fitness, Profession, and 3+ comorbidities:  OA, Bilat TKE, DM, A-Fib  are also affecting patient's functional outcome.   REHAB POTENTIAL: Good  CLINICAL DECISION MAKING: Evolving/moderate complexity  EVALUATION COMPLEXITY: Moderate   GOALS: Goals reviewed with patient? Yes  SHORT TERM GOALS: Target date: 10/14/2022  Patient will demonstrate competence with basic HEP. Baseline: Goal status: MET  2.  Patient will perform a 6 MWT test to receive a baseline measurement to assess aerobic endurance and to set prolonged walking goals.  Baseline: patient shared they avoid prolonged walking due to pain  Goal status: IN PROGRESS  3.  Patient will demonstrate proper technique when lifting light weight (5-10#) objects off of the floor without increased pain.  Baseline: MOI [place] suitcase at airport  Goal status: IN PROGRESS  4.  Patient will demonstrate proper log roll technique in and out bed to avoid increased pain. Baseline: transferring was difficult  Goal status: IN PROGRESS   LONG TERM GOALS: Target date: 11/18/2022  Patient will perform advanced HEP independently upon discharge.  Baseline:  Goal status: INITIAL  2.  Patient will decrease TUG time to no greater than 10 sec in order to decrease the risk of future falls.  Baseline: 12.84 sec Goal status: INITIAL  3.  Patient will improve their FOTO score to 56% in order to participate in ADLs and IADLs without increased pain.  Baseline: 40% Goal status: INITIAL  4.  Patient will be able to walk for 20 minutes without increased pain for community activity, such as grocery shopping.  Baseline: patient shared that they have trouble walking to and from the bathroom at work  Goal status: INITIAL  5.  Patient will increase global LE strength to at least 4+ to 5-/5 required for getting on and off chair at work.  Baseline: 4/5, patient reported fear of falling   Goal status: INITIAL   PLAN:  PT FREQUENCY: 1-2x/week  PT DURATION: 8 weeks  PLANNED INTERVENTIONS: Therapeutic exercises, Therapeutic activity, Neuromuscular re-education, Balance training, Gait training, Patient/Family education, Self Care, Joint mobilization, Stair training, Aquatic Therapy, Dry Needling, Electrical stimulation, Spinal manipulation, Spinal mobilization, Cryotherapy, Moist heat, Taping, Traction, Ultrasound, Ionotophoresis 4mg /ml Dexamethasone, Manual therapy, and Re-evaluation.  PLAN FOR NEXT SESSION: Aquatic and land therapy for LE flexibility and core strengthening. Work on walking tolerance if able.  Focus on hip strengthening as tolerated.   Mayer Camel, PTA 10/25/22 6:19 PM [facility] [address] [place], [place], [address] Phone: [phone]   Fax:  [phone]

## 2022-10-26 DIAGNOSIS — E113511 Type 2 diabetes mellitus with proliferative diabetic retinopathy with macular edema, right eye: Secondary | ICD-10-CM | POA: Diagnosis not present

## 2022-11-01 ENCOUNTER — Encounter (HOSPITAL_BASED_OUTPATIENT_CLINIC_OR_DEPARTMENT_OTHER): Payer: Self-pay

## 2022-11-01 ENCOUNTER — Ambulatory Visit (HOSPITAL_BASED_OUTPATIENT_CLINIC_OR_DEPARTMENT_OTHER): Payer: 59 | Admitting: Physical Therapy

## 2022-11-02 ENCOUNTER — Ambulatory Visit (INDEPENDENT_AMBULATORY_CARE_PROVIDER_SITE_OTHER): Payer: Medicare Other | Admitting: Family Medicine

## 2022-11-02 ENCOUNTER — Encounter (INDEPENDENT_AMBULATORY_CARE_PROVIDER_SITE_OTHER): Payer: Self-pay

## 2022-11-02 DIAGNOSIS — J069 Acute upper respiratory infection, unspecified: Secondary | ICD-10-CM | POA: Diagnosis not present

## 2022-11-02 DIAGNOSIS — J029 Acute pharyngitis, unspecified: Secondary | ICD-10-CM | POA: Diagnosis not present

## 2022-11-03 ENCOUNTER — Ambulatory Visit: Payer: 59

## 2022-11-08 ENCOUNTER — Encounter (HOSPITAL_BASED_OUTPATIENT_CLINIC_OR_DEPARTMENT_OTHER): Payer: Self-pay | Admitting: Physical Therapy

## 2022-11-08 ENCOUNTER — Ambulatory Visit (HOSPITAL_BASED_OUTPATIENT_CLINIC_OR_DEPARTMENT_OTHER): Payer: 59 | Admitting: Physical Therapy

## 2022-11-08 DIAGNOSIS — M6281 Muscle weakness (generalized): Secondary | ICD-10-CM | POA: Diagnosis not present

## 2022-11-08 DIAGNOSIS — R252 Cramp and spasm: Secondary | ICD-10-CM

## 2022-11-08 DIAGNOSIS — M5459 Other low back pain: Secondary | ICD-10-CM

## 2022-11-08 NOTE — Therapy (Signed)
OUTPATIENT PHYSICAL THERAPY THORACOLUMBAR TREATMENT   Patient Name: [name] MRN: [id number] DOB:1950/06/25, 72 y.o., female Today's Date: 11/08/2022  END OF SESSION:  PT End of Session - 11/08/22 1536     Visit Number 11    Date for PT Re-Evaluation 11/18/22    Authorization Type [employer]    Progress Note Due on Visit 19    PT Start Time 1530    PT Stop Time 1610    PT Time Calculation (min) 40 min    Activity Tolerance Patient tolerated treatment well    Behavior During Therapy WFL for tasks assessed/performed              Past Medical History:  Diagnosis Date   A-fib (HCC)    Anemia    hx of   Arthritis    Chest pain    Constipation    Depression    Depression    Diabetes mellitus ORAL MEDS   Diabetic retinopathy (HCC)    Diabetic retinopathy (HCC)    Dyspnea    with minimal exertion-deconditioned   Dyspnea    Dysrhythmia    a-fib   Food allergy    GERD (gastroesophageal reflux disease)    occasional   Gout    Heartburn    History of kidney stones    Hypercholesteremia    Hyperlipidemia    Hypertension    Hypertensive kidney disease    Hypothyroidism    Joint pain    Kidney problem    Knee pain, right    Left arm numbness DUE TO CERVICAL PINCHED NERVE   Obesity    OSA on CPAP    uses CPAP nightly   Osteoarthritis    Osteoarthritis    Palpitations    Pinched nerve in neck    Pleurisy    Pneumonia    hx of   PONV (postoperative nausea and vomiting)    for 3-4 days after general anesthesia approx 10-12 years ago   Sciatica    Shortness of breath    Sleep apnea    Spinal stenosis    Swelling of both lower extremities    Swelling of knee joint, right    Synovitis of knee RIGHT   Trigger finger, left    left index   Vitamin D deficiency    Past Surgical History:  Procedure Laterality Date   CESAREAN SECTION  X3   KNEE ARTHROSCOPY  04/20/2011   Procedure: ARTHROSCOPY KNEE;  Surgeon: Loanne Drilling, MD;  Location: [facility];  Service: Orthopedics;  Laterality: Right;  WITH SYNOVECTOMY   LEFT CARPAL TUNNEL / LEFT MIDDLE & RING FINGER TRIGGER RELEASE  08-26-2008   LEFT SHOULDER ARTHROSCOPY W/ DEBRIDEMENT  09-09-2003   LEFT SHOULDER ARTHROSCOPY/ LEFT THUMB TRIGGER RELEASE  02-22-2005   PHOTOCOAGULATION WITH LASER Right 03/20/2018   Procedure: PHOTOCOAGULATION WITH LASER;  Surgeon: Carmela Rima, MD;  Location: [facility] OR;  Service: Ophthalmology;  Laterality: Right;   PULLEY RELEASE LEFT LONG FINGER  07-14-2009   RADIAL HEAD ARTHROPLASTY Right 06/17/2018   Procedure: RADIAL HEAD ARTHROPLASTY;  Surgeon: Bjorn Pippin, MD;  Location: [facility] OR;  Service: Orthopedics;  Laterality: Right;   RIGHT CARPAL TUNNEL/ RIGHT THUMB TRIGGER RELEASE'S  11-28-2006   RIGHT SHOULDER ARTHROSCOPY W/ ROTATOR CUFF REPAIR  01-13-2004   SHOULDER ARTHROSCOPY DISTAL CLAVICLE EXCISION AND OPEN ROTATOR CUFF REPAIR  09-07-2004   LEFT   SHOULDER ARTHROSCOPY W/ ACROMIAL REPAIR  11-29-2005   LEFT  SYNOVECTOMY Left 06/20/2022   Procedure: left index finger and left long finger metacarpal phalangel joint syonvectomy;  Surgeon: Dairl Ponder, MD;  Location: [facility] OR;  Service: Orthopedics;  Laterality: Left;  needs 1 hour of time..   TONSILLECTOMY AND ADENOIDECTOMY  child   TOTAL KNEE ARTHROPLASTY  12-14-2009   RIGHT   TOTAL KNEE ARTHROPLASTY Left 11/05/2012   Procedure: LEFT TOTAL KNEE ARTHROPLASTY;  Surgeon: Loanne Drilling, MD;  Location: [facility];  Service: Orthopedics;  Laterality: Left;   TOTAL KNEE REVISION Right 07/29/2016   Procedure: RIGHT KNEE POLY-LINER EXCHANGE;  Surgeon: Kathryne Hitch, MD;  Location: [facility];  Service: Orthopedics;  Laterality: Right;   TRIGGER FINGER RELEASE Right 02/21/2013   Procedure: RIGHT RING A-1 PULLEY RELEASE    (MINOR PROCEDURE) ;  Surgeon: Wyn Forster., MD;  Location: [facility];  Service: Orthopedics;  Laterality: Right;   TRIGGER FINGER RELEASE Left 09/22/2016   Procedure:  RELEASE TRIGGER FINGER LEFT INDEX FINGER;  Surgeon: Kathryne Hitch, MD;  Location: [facility] OR;  Service: Orthopedics;  Laterality: Left;   TRIGGER FINGER RELEASE Right 01/20/2017   Procedure: RELEASE TRIGGER FINGER/A-1 PULLEY RIGHT INDEX FINGER;  Surgeon: Marcene Corning, MD;  Location: [facility];  Service: Orthopedics;  Laterality: Right;   VITRECTOMY 25 GAUGE WITH SCLERAL BUCKLE Right 03/20/2018   Procedure: RIGHT EYE VITRECTOMY WITH  ENDOLASER PARENTAL PHOTOCOAGULATION 25 GAUGE;  Surgeon: Carmela Rima, MD;  Location: [facility] OR;  Service: Ophthalmology;  Laterality: Right;   WRIST ARTHROSCOPY WITH DEBRIDEMENT Right 05/01/2019   Procedure: WRIST ARTHROSCOPY WITH DEBRIDEMENT;  Surgeon: Dairl Ponder, MD;  Location: [facility];  Service: Orthopedics;  Laterality: Right;   Patient Active Problem List   Diagnosis Date Noted   Notalgia 08/03/2022   BMI 45.0-49.9, adult (HCC) 06/13/2022   Other secondary hypertension 06/13/2022   SOBOE (shortness of breath on exertion) 05/30/2022   Depression screening 05/30/2022   Morbid obesity (HCC) 05/30/2022   Class 3 severe obesity with serious comorbidity and body mass index (BMI) of 50.0 to 59.9 in adult [facility]) 05/30/2022   Paroxysmal atrial fibrillation (HCC) 10/22/2021   Hypercoagulable state due to paroxysmal atrial fibrillation (HCC) 10/22/2021   Radial head fracture, closed 06/16/2018   Closed fracture dislocation of elbow 06/16/2018   Other fatigue 07/18/2017   Shortness of breath on exertion 07/18/2017   Type 2 diabetes mellitus with retinopathy, with long-term current use of insulin (HCC) 07/18/2017   Vitamin D deficiency 07/18/2017   B12 nutritional deficiency 07/18/2017   Other specified hypothyroidism 07/18/2017   Acute pain of right knee 12/22/2016   Trigger index finger of left hand 09/22/2016   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 requiring isolated polyethylene liner exchange (HCC)  07/29/2016   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) 04/14/2016   Diabetes (HCC) 01/08/2014   Essential hypertension 01/08/2014   Hyperlipidemia 01/08/2014   Postoperative anemia due to acute blood loss 11/22/2012   Hyponatremia 11/06/2012   OA (osteoarthritis) of knee 11/05/2012   Villonodular synovitis of knee 04/20/2011    PCP: Thana Ates, MD  REFERRING PROVIDER: Rodolph Bong, MD   REFERRING DIAG: M54.42,M54.41,G89.29 (ICD-10-CM) - Chronic bilateral low back pain with bilateral sciatica   Rationale for Evaluation and Treatment: Rehabilitation  THERAPY DIAG:  Other low back pain  Cramp and spasm  Muscle weakness (generalized)  ONSET DATE: "a couple of years", but started worsening in May 2024  SUBJECTIVE:  SUBJECTIVE STATEMENT: Pt reports she is recovering from covid.  When she was off from work, she reports her back felt better.  She reported that the tape applied last session didn't seem to last or help.  She has membership to [facility], but has not returned yet.   PERTINENT HISTORY:  OA, A-Fib, DM, bilateral TKA, bilateral shoulder surgeries  PAIN:  Are you having pain? Yes: NPRS scale: 2/10 Pain location: low back Pain description: pain radiates from buttocks down bilateral LE past knees (Left more involved than Right) Aggravating factors: coughing, prolonged sitting and standing  Relieving factors: injection, sleep, weight shifting    PRECAUTIONS: None  WEIGHT BEARING RESTRICTIONS: No  FALLS:  Has patient fallen in last 6 months? No  LIVING ENVIRONMENT: Lives with: lives alone Lives in: House/apartment Stairs: No Has following equipment at home: Single point cane  OCCUPATION: Works in admissions in [place]  PLOF: Independent and Leisure:  crafting  PATIENT GOALS: To be able to walk without hunching over and less pain.  NEXT MD VISIT: as needed  OBJECTIVE:   DIAGNOSTIC FINDINGS:  Lumbar MRI on 08/28/2022: IMPRESSION: 1. Lumbar spine spondylosis as described above. 2. No acute osseous injury of the lumbar spine.  Lumbar Radiograph on 08/09/2022: IMPRESSION: Multilevel degenerative changes of the lumbar spine as discussed above.  PATIENT SURVEYS:  Eval:  FOTO 40 (projected 56 by visit 12)  COGNITION: Overall cognitive status: Within functional limits for tasks assessed     SENSATION: WFL  MUSCLE LENGTH: Hamstrings: Tightness bilaterally   POSTURE: rounded shoulders, forward head, increased lumbar lordosis, increased thoracic kyphosis, and posterior pelvic tilt   LUMBAR ROM:  Some decreased motion with pain  LOWER EXTREMITY ROM:   WFL  LOWER EXTREMITY MMT:   Eval:  Bilateral LE strength grossly 4/5 throughout with some pain noted  LUMBAR SPECIAL TESTS:  Slump test: Positive  FUNCTIONAL TESTS:  09/22/2022: 5 times sit to stand: 10.15 sec Timed up and go (TUG): 12.84 sec  GAIT: Distance walked: 200 ft Assistive device utilized: None Level of assistance: Complete Independence Comments: Pt with antalgic gait  TODAY'S TREATMENT:    11/08/22: Patient seen for aquatic therapy today.  Treatment took place in water 2.5-4 feet deep depending upon activity.  Pt entered the pool via stairs with bilat UE on rails independently. Pt requires buoyancy of water for support and to offload joints with strengthening exercises.    * walking forward/ backward  * side stepping with arm addct with yellow hand floats (yellow too much for shoulders) * TrA set with short blue noodle pull down to thighs x 15 * staggered stance with kick board row 2 x 10 * holding hand floats: alternating single leg clams, leg swings into hip flex/ext x 10 each * return to walking forward/ backward with alternating row motion  * L stretch  with feet on wall, x 3 * STS at bench in water with blue step under feet x 10, cues for core engagement, hip hinge, forward arm reach * straddling noodle:  cycling with gentle breast stroke arms  10/25/22: Patient seen for aquatic therapy today.  Treatment took place in water 2.5-4 feet deep depending upon activity.  Pt entered the pool via stairs with bilat railings. Pt requires buoyancy of water for support and to offload joints with strengthening exercises.    * walking forward/ backward, side stepping  * straddling noodle gentle skulling of arms:  cycling, hip abdct/ addct  * holding wall:  marching  in place, hip openers, L stretch, (wag the tail not tolerated)  * staggered stance with horiz abdct/ addct (arms under water, no added resistance) * given information on TENS * after dried off:  applied reg Rock tape across L/R SI joints and 1 piece across sacrum with 20% stretch to decompress tissue and increase proprioception.   10/24/22: Therex Nustep L6 x 6 min Sidelying clamshell 2x10  Therapeutic Activty Side roll L<>R -- remains painful when bridging up to make space to turn Manual therapy STM & TPR bilat glutes, lumbar paraspinals Skilled assessment and palpation for TPDN Trigger Point Dry-Needling  Treatment instructions: Expect mild to moderate muscle soreness. S/S of pneumothorax if dry needled over a lung field, and to seek immediate medical attention should they occur. Patient verbalized understanding of these instructions and education. Patient Consent Given: Yes Education handout provided: Previously provided Muscles treated: bilat glute max, glute med Electrical stimulation performed: No Parameters: N/A Treatment response/outcome: Twitch response, decreased muscle tension Modalities Lumbar traction x 15 min; 125lb max, 45lb min, 50% speed  10/19/22: Patient seen for aquatic therapy today.  Treatment took place in water 2.5-4 feet deep depending upon activity.  Pt entered  the pool via stairs with moderate use of railings, caution used. Pt requires buoyancy of water for support and to offload joints with strengthening exercises.    * walking forward/ backward  * side stepping with slight lunges with arm addct with rainbow hand floats x 4 widths *Core engagement: rainbow HB carry forward and back x 2 widths each. No discomfort.  - yellow hb unilateral submersion forward and backward amb  *L stretch on bottom step x 3: L stretch with wag the tail *standing figure 4 stretch * TrA set with solid noodle -> yellow hand buoys wide stance x10; staggered x 10 rep. (Improved from solid noodle) * decompression using yellow noodle: cycling in position;add/abd * holding wall: 1/2 diamonds x 10 R/L *seated on noodle: posterior pelvic tilting. VC and demonstration for execution    Pt requires the buoyancy and hydrostatic pressure of water for support, and to offload joints by unweighting joint load by at least 50 % in navel deep water and by at least 75-80% in chest to neck deep water.  Viscosity of the water is needed for resistance of strengthening. Water current perturbations provides challenge to standing balance requiring increased core activation.  10/17/22: Therex Nustep L5 x 5 min UEs/LEs Sitting figure 4 stretch x1 min Sitting piriformis stretch x 1 min Manual therapy STM & TPR bilat glutes, lumbar paraspinals Skilled assessment and palpation for TPDN Trigger Point Dry-Needling  Treatment instructions: Expect mild to moderate muscle soreness. S/S of pneumothorax if dry needled over a lung field, and to seek immediate medical attention should they occur. Patient verbalized understanding of these instructions and education. Patient Consent Given: Yes Education handout provided: Previously provided Muscles treated: bilat glute max, glute med Electrical stimulation performed: No Parameters: N/A Treatment response/outcome: Twitch response, decreased muscle  tension Modalities Lumbar traction x 15 min; 125lb max, 45lb min, 50% speed   10/12/22: Patient seen for aquatic therapy today.  Treatment took place in water 2.5-4 feet deep depending upon activity.  Pt entered the pool via stairs with moderate use of railings, caution used. Pt requires buoyancy of water for support and to offload joints with strengthening exercises.    * walking forward/ backward - then with added row motion with rainbow hand floats * side stepping with slight lunges with arm  addct with rainbow hand floats x 4 widths *L stretch on bottom step x 3: L stretch with wag the tail * holding wall: hip openers (hip abdct/knee flex)x 10 * TrA set with solid noodle wide stance then staggered x 10 rep. (Improved from 1/2 noodle) *Core engagement: rainbow HB carry forward and back x 2 widths each.  Increased depth for I proved toleration as pt reports slight discomfort in LB with shallower submersion * decompression using yellow noodle: then cycling in position.  Pt requires the buoyancy and hydrostatic pressure of water for support, and to offload joints by unweighting joint load by at least 50 % in navel deep water and by at least 75-80% in chest to neck deep water.  Viscosity of the water is needed for resistance of strengthening. Water current perturbations provides challenge to standing balance requiring increased core activation.    10/10/22: Nustep x 5 min L1 Seated pball roll out 5x10 sec, with lateral flexion x20 sec L stretch at counter x20 sec Seated hamstring stretch x 30 sec Supine LTR x10 Supine PPT + diaphragmatic breaths x10, with ball squeeze x10 Supine PPT + diaphragmatic breaths + glute set x10 Supine PPT + diaphragmatic breaths + bent knee fall outs blue TB x10 Supine feet on pball roll out maintaining core contraction 2x10 Seated shoulder ER 2x10 Seated W 2x10    PATIENT EDUCATION:  Education details: aquatic therapy modifications  Person educated:  Patient Education method: Medical illustrator and Handouts Education comprehension: verbalized understanding  HOME EXERCISE PROGRAM: Access Code: [email or web address] URL: [email or web address] Date: 10/10/2022 Prepared by: Vernon Prey April Kirstie Peri  Exercises - Seated Hamstring Stretch  - 1 x daily - 7 x weekly - 2 reps - 20 hold - Standing March with Counter Support  - 1 x daily - 7 x weekly - 2 sets - 10 reps - Standing Hip Abduction with Unilateral Counter Support  - 1 x daily - 7 x weekly - 2 sets - 10 reps - Standing Hip Extension with Unilateral Counter Support  - 1 x daily - 7 x weekly - 2 sets - 10 reps - Standing 'L' Stretch at Counter  - 1 x daily - 7 x weekly - 2 sets - 30 sec hold - Posterior Pelvic Tilt with Adduction Using Ball in Supine  - 1 x daily - 7 x weekly - 2 sets - 10 reps - Hooklying Clamshell with Resistance  - 1 x daily - 7 x weekly - 2 sets - 10 reps  Access Code: [email or web address] URL: [email or web address] Date: 10/25/2022 Prepared by: [facility] - [address]  Patient Education - TENS Unit - TENS Therapy  ASSESSMENT:  CLINICAL IMPRESSION: Pt pain level waxes and wanes during session. Good tolerance to most of the exercises; requires occasional LB stretch due to tightness.   Pt reported mild reduction of low back pain with suspended cycling. If plan of care is extended, will create HEP for pt to complete at [facility] at discharge.  Therapist to assess goals at next visit; end of POC.    OBJECTIVE IMPAIRMENTS: Abnormal gait, decreased activity tolerance, decreased balance, decreased coordination, decreased endurance, decreased mobility, difficulty walking, decreased ROM, decreased strength, impaired flexibility, improper body mechanics, postural dysfunction, obesity, and pain.   ACTIVITY LIMITATIONS: carrying, lifting, bending, sitting, standing, squatting, and locomotion level  PARTICIPATION LIMITATIONS: cleaning,  laundry, community activity, and occupation  PERSONAL FACTORS: Age, Fitness, Profession, and 3+ comorbidities: OA, Bilat TKE, DM, A-Fib  are also affecting  patient's functional outcome.   REHAB POTENTIAL: Good  CLINICAL DECISION MAKING: Evolving/moderate complexity  EVALUATION COMPLEXITY: Moderate   GOALS: Goals reviewed with patient? Yes  SHORT TERM GOALS: Target date: 10/14/2022  Patient will demonstrate competence with basic HEP. Baseline: Goal status: MET  2.  Patient will perform a 6 MWT test to receive a baseline measurement to assess aerobic endurance and to set prolonged walking goals.  Baseline: patient shared they avoid prolonged walking due to pain  Goal status: IN PROGRESS  3.  Patient will demonstrate proper technique when lifting light weight (5-10#) objects off of the floor without increased pain.  Baseline: MOI was [place] suitcase at airport  Goal status: IN PROGRESS  4.  Patient will demonstrate proper log roll technique in and out bed to avoid increased pain. Baseline: transferring was difficult  Goal status: IN PROGRESS   LONG TERM GOALS: Target date: 11/18/2022  Patient will perform advanced HEP independently upon discharge.  Baseline:  Goal status: INITIAL  2.  Patient will decrease TUG time to no greater than 10 sec in order to decrease the risk of future falls.  Baseline: 12.84 sec Goal status: INITIAL  3.  Patient will improve their FOTO score to 56% in order to participate in ADLs and IADLs without increased pain.  Baseline: 40% Goal status: INITIAL  4.  Patient will be able to walk for 20 minutes without increased pain for community activity, such as grocery shopping.  Baseline: patient shared that they have trouble walking to and from the bathroom at work  Goal status: INITIAL  5.  Patient will increase global LE strength to at least 4+ to 5-/5 required for getting on and off chair at work.  Baseline: 4/5, patient reported fear of falling   Goal status: INITIAL   PLAN:  PT FREQUENCY: 1-2x/week  PT DURATION: 8 weeks  PLANNED INTERVENTIONS: Therapeutic exercises, Therapeutic activity, Neuromuscular re-education, Balance training, Gait training, Patient/Family education, Self Care, Joint mobilization, Stair training, Aquatic Therapy, Dry Needling, Electrical stimulation, Spinal manipulation, Spinal mobilization, Cryotherapy, Moist heat, Taping, Traction, Ultrasound, Ionotophoresis 4mg /ml Dexamethasone, Manual therapy, and Re-evaluation.  PLAN FOR NEXT SESSION: Aquatic and land therapy for LE flexibility and core strengthening. Work on walking tolerance if able.  Focus on hip strengthening as tolerated.   Mayer Camel, PTA 11/08/22 5:57 PM [facility] [address] [place], [place], [address] Phone: [phone]   Fax:  [phone]

## 2022-11-09 ENCOUNTER — Other Ambulatory Visit (HOSPITAL_COMMUNITY): Payer: Self-pay

## 2022-11-09 ENCOUNTER — Other Ambulatory Visit: Payer: Self-pay

## 2022-11-09 MED ORDER — INSULIN GLARGINE-YFGN 100 UNIT/ML ~~LOC~~ SOPN
70.0000 [IU] | PEN_INJECTOR | Freq: Two times a day (BID) | SUBCUTANEOUS | 11 refills | Status: DC
Start: 2022-11-09 — End: 2023-07-19
  Filled 2022-11-09: qty 30, 28d supply, fill #0
  Filled 2022-11-18 – 2022-12-13 (×2): qty 30, 28d supply, fill #1
  Filled 2023-01-05: qty 30, 28d supply, fill #2
  Filled 2023-02-01: qty 30, 28d supply, fill #3
  Filled 2023-02-26: qty 30, 28d supply, fill #4
  Filled 2023-03-22: qty 30, 28d supply, fill #5
  Filled 2023-04-16: qty 30, 28d supply, fill #6
  Filled 2023-05-15: qty 30, 28d supply, fill #7
  Filled 2023-06-09: qty 30, 28d supply, fill #8
  Filled 2023-07-05: qty 30, 28d supply, fill #9

## 2022-11-10 ENCOUNTER — Ambulatory Visit: Payer: 59

## 2022-11-10 DIAGNOSIS — R262 Difficulty in walking, not elsewhere classified: Secondary | ICD-10-CM

## 2022-11-10 DIAGNOSIS — M5459 Other low back pain: Secondary | ICD-10-CM | POA: Diagnosis not present

## 2022-11-10 DIAGNOSIS — M6281 Muscle weakness (generalized): Secondary | ICD-10-CM

## 2022-11-10 DIAGNOSIS — G4733 Obstructive sleep apnea (adult) (pediatric): Secondary | ICD-10-CM | POA: Diagnosis not present

## 2022-11-10 DIAGNOSIS — R293 Abnormal posture: Secondary | ICD-10-CM

## 2022-11-10 DIAGNOSIS — R252 Cramp and spasm: Secondary | ICD-10-CM

## 2022-11-10 NOTE — Therapy (Signed)
OUTPATIENT PHYSICAL THERAPY THORACOLUMBAR TREATMENT   Patient Name: [name] MRN: [id number] DOB:10-07-1950, 72 y.o., female Today's Date: 11/11/2022  END OF SESSION:  PT End of Session - 11/10/22 1533     Visit Number 12    Date for PT Re-Evaluation 12/22/22    Authorization Type [employer]    Progress Note Due on Visit 19    PT Start Time 1533    PT Stop Time 1619    PT Time Calculation (min) 46 min    Activity Tolerance Patient tolerated treatment well    Behavior During Therapy WFL for tasks assessed/performed              Past Medical History:  Diagnosis Date   A-fib (HCC)    Anemia    hx of   Arthritis    Chest pain    Constipation    Depression    Depression    Diabetes mellitus ORAL MEDS   Diabetic retinopathy (HCC)    Diabetic retinopathy (HCC)    Dyspnea    with minimal exertion-deconditioned   Dyspnea    Dysrhythmia    a-fib   Food allergy    GERD (gastroesophageal reflux disease)    occasional   Gout    Heartburn    History of kidney stones    Hypercholesteremia    Hyperlipidemia    Hypertension    Hypertensive kidney disease    Hypothyroidism    Joint pain    Kidney problem    Knee pain, right    Left arm numbness DUE TO CERVICAL PINCHED NERVE   Obesity    OSA on CPAP    uses CPAP nightly   Osteoarthritis    Osteoarthritis    Palpitations    Pinched nerve in neck    Pleurisy    Pneumonia    hx of   PONV (postoperative nausea and vomiting)    for 3-4 days after general anesthesia approx 10-12 years ago   Sciatica    Shortness of breath    Sleep apnea    Spinal stenosis    Swelling of both lower extremities    Swelling of knee joint, right    Synovitis of knee RIGHT   Trigger finger, left    left index   Vitamin D deficiency    Past Surgical History:  Procedure Laterality Date   CESAREAN SECTION  X3   KNEE ARTHROSCOPY  04/20/2011   Procedure: ARTHROSCOPY KNEE;  Surgeon: Loanne Drilling, MD;  [facility]: [facility];  Service: Orthopedics;  Laterality: Right;  WITH SYNOVECTOMY   LEFT CARPAL TUNNEL / LEFT MIDDLE & RING FINGER TRIGGER RELEASE  08-26-2008   LEFT SHOULDER ARTHROSCOPY W/ DEBRIDEMENT  09-09-2003   LEFT SHOULDER ARTHROSCOPY/ LEFT THUMB TRIGGER RELEASE  02-22-2005   PHOTOCOAGULATION WITH LASER Right 03/20/2018   Procedure: PHOTOCOAGULATION WITH LASER;  Surgeon: Carmela Rima, MD;  Location: [facility] OR;  Service: Ophthalmology;  Laterality: Right;   PULLEY RELEASE LEFT LONG FINGER  07-14-2009   RADIAL HEAD ARTHROPLASTY Right 06/17/2018   Procedure: RADIAL HEAD ARTHROPLASTY;  Surgeon: Bjorn Pippin, MD;  Location: [facility] OR;  Service: Orthopedics;  Laterality: Right;   RIGHT CARPAL TUNNEL/ RIGHT THUMB TRIGGER RELEASE'S  11-28-2006   RIGHT SHOULDER ARTHROSCOPY W/ ROTATOR CUFF REPAIR  01-13-2004   SHOULDER ARTHROSCOPY DISTAL CLAVICLE EXCISION AND OPEN ROTATOR CUFF REPAIR  09-07-2004   LEFT   SHOULDER ARTHROSCOPY W/ ACROMIAL REPAIR  11-29-2005   LEFT  SYNOVECTOMY Left 06/20/2022   Procedure: left index finger and left long finger metacarpal phalangel joint syonvectomy;  Surgeon: Dairl Ponder, MD;  Location: [facility] OR;  Service: Orthopedics;  Laterality: Left;  needs 1 hour of time..   TONSILLECTOMY AND ADENOIDECTOMY  child   TOTAL KNEE ARTHROPLASTY  12-14-2009   RIGHT   TOTAL KNEE ARTHROPLASTY Left 11/05/2012   Procedure: LEFT TOTAL KNEE ARTHROPLASTY;  Surgeon: Loanne Drilling, MD;  Location: [facility];  Service: Orthopedics;  Laterality: Left;   TOTAL KNEE REVISION Right 07/29/2016   Procedure: RIGHT KNEE POLY-LINER EXCHANGE;  Surgeon: Kathryne Hitch, MD;  Location: [facility];  Service: Orthopedics;  Laterality: Right;   TRIGGER FINGER RELEASE Right 02/21/2013   Procedure: RIGHT RING A-1 PULLEY RELEASE    (MINOR PROCEDURE) ;  Surgeon: Wyn Forster., MD;  [facility];  Service: Orthopedics;  Laterality: Right;   TRIGGER FINGER RELEASE Left 09/22/2016   Procedure:  RELEASE TRIGGER FINGER LEFT INDEX FINGER;  Surgeon: Kathryne Hitch, MD;  Location: [facility] OR;  Service: Orthopedics;  Laterality: Left;   TRIGGER FINGER RELEASE Right 01/20/2017   Procedure: RELEASE TRIGGER FINGER/A-1 PULLEY RIGHT INDEX FINGER;  Surgeon: Marcene Corning, MD;  Location: [facility];  Service: Orthopedics;  Laterality: Right;   VITRECTOMY 25 GAUGE WITH SCLERAL BUCKLE Right 03/20/2018   Procedure: RIGHT EYE VITRECTOMY WITH  ENDOLASER PARENTAL PHOTOCOAGULATION 25 GAUGE;  Surgeon: Carmela Rima, MD;  Location: [facility] OR;  Service: Ophthalmology;  Laterality: Right;   WRIST ARTHROSCOPY WITH DEBRIDEMENT Right 05/01/2019   Procedure: WRIST ARTHROSCOPY WITH DEBRIDEMENT;  Surgeon: Dairl Ponder, MD;  Location: [facility];  Service: Orthopedics;  Laterality: Right;   Patient Active Problem List   Diagnosis Date Noted   Notalgia 08/03/2022   BMI 45.0-49.9, adult (HCC) 06/13/2022   Other secondary hypertension 06/13/2022   SOBOE (shortness of breath on exertion) 05/30/2022   Depression screening 05/30/2022   Morbid obesity (HCC) 05/30/2022   Class 3 severe obesity with serious comorbidity and body mass index (BMI) of 50.0 to 59.9 in adult [facility]) 05/30/2022   Paroxysmal atrial fibrillation (HCC) 10/22/2021   Hypercoagulable state due to paroxysmal atrial fibrillation (HCC) 10/22/2021   Radial head fracture, closed 06/16/2018   Closed fracture dislocation of elbow 06/16/2018   Other fatigue 07/18/2017   Shortness of breath on exertion 07/18/2017   Type 2 diabetes mellitus with retinopathy, with long-term current use of insulin (HCC) 07/18/2017   Vitamin D deficiency 07/18/2017   B12 nutritional deficiency 07/18/2017   Other specified hypothyroidism 07/18/2017   Acute pain of right knee 12/22/2016   Trigger index finger of left hand 09/22/2016   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 requiring isolated polyethylene liner exchange (HCC)  07/29/2016   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) 04/14/2016   Diabetes (HCC) 01/08/2014   Essential hypertension 01/08/2014   Hyperlipidemia 01/08/2014   Postoperative anemia due to acute blood loss 11/22/2012   Hyponatremia 11/06/2012   OA (osteoarthritis) of knee 11/05/2012   Villonodular synovitis of knee 04/20/2011    PCP: Thana Ates, MD  REFERRING PROVIDER: Rodolph Bong, MD   REFERRING DIAG: M54.42,M54.41,G89.29 (ICD-10-CM) - Chronic bilateral low back pain with bilateral sciatica   Rationale for Evaluation and Treatment: Rehabilitation  THERAPY DIAG:  Other low back pain  Cramp and spasm  Muscle weakness (generalized)  Difficulty in walking, not elsewhere classified  Abnormal posture  ONSET DATE: "a couple of years",  but started worsening in May 2024  SUBJECTIVE:                                                                                                                                                                                           SUBJECTIVE STATEMENT: Pt reports there are good days and bad days.  "Like yesterday, my pain was 2/10, but today I feel like I am squished back down"     PERTINENT HISTORY:  OA, A-Fib, DM, bilateral TKA, bilateral shoulder surgeries  PAIN:  11/11/22 Are you having pain? Yes: NPRS scale: 2/10 Pain location: low back Pain description: pain radiates from buttocks down bilateral LE past knees (Left more involved than Right) Aggravating factors: coughing, prolonged sitting and standing  Relieving factors: injection, sleep, weight shifting    PRECAUTIONS: None  WEIGHT BEARING RESTRICTIONS: No  FALLS:  Has patient fallen in last 6 months? No  LIVING ENVIRONMENT: Lives with: lives alone Lives in: House/apartment Stairs: No Has following equipment at home: Single point cane  OCCUPATION: Works in admissions in [employer]  PLOF: Independent and Leisure: crafting  PATIENT GOALS: To be able to  walk without hunching over and less pain.  NEXT MD VISIT: as needed  OBJECTIVE:   DIAGNOSTIC FINDINGS:  Lumbar MRI on 08/28/2022: IMPRESSION: 1. Lumbar spine spondylosis as described above. 2. No acute osseous injury of the lumbar spine.  Lumbar Radiograph on 08/09/2022: IMPRESSION: Multilevel degenerative changes of the lumbar spine as discussed above.  PATIENT SURVEYS:  Eval:  FOTO 40 (projected 58 by visit 12) 11/11/22: FOTO 58 (answers based on patients subjective report)  COGNITION: Overall cognitive status: Within functional limits for tasks assessed     SENSATION: WFL  MUSCLE LENGTH: Hamstrings: Tightness bilaterally   POSTURE: rounded shoulders, forward head, increased lumbar lordosis, increased thoracic kyphosis, and posterior pelvic tilt   LUMBAR ROM:  Some decreased motion with pain  LOWER EXTREMITY ROM:   WFL  LOWER EXTREMITY MMT:   Eval:  Bilateral LE strength grossly 4/5 throughout with some pain noted  LUMBAR SPECIAL TESTS:  Slump test: Positive  FUNCTIONAL TESTS:  09/22/2022: 5 times sit to stand: 10.15 sec Timed up and go (TUG): 12.84 sec  11/10/22: 5 times sit to stand: 9.00 sec Timed up and go (TUG): 10.98 sec  GAIT: Distance walked: 200 ft Assistive device utilized: None Level of assistance: Complete Independence Comments: Pt with antalgic gait  TODAY'S TREATMENT:    11/10/22: Recert assessment completed Reviewed current land HEP Discussed DC plan and safe activities for home, discussed work station and reviewed the various options for making her station more ergonomic  11/08/22: Patient seen for aquatic therapy today.  Treatment took place in water 2.5-4 feet deep depending upon activity.  Pt entered the pool via stairs with bilat UE on rails independently. Pt requires buoyancy of water for support and to offload joints with strengthening exercises.    * walking forward/ backward  * side stepping with arm addct with yellow hand floats  (yellow too much for shoulders) * TrA set with short blue noodle pull down to thighs x 15 * staggered stance with kick board row 2 x 10 * holding hand floats: alternating single leg clams, leg swings into hip flex/ext x 10 each * return to walking forward/ backward with alternating row motion  * L stretch with feet on wall, x 3 * STS at bench in water with blue step under feet x 10, cues for core engagement, hip hinge, forward arm reach * straddling noodle:  cycling with gentle breast stroke arms  10/25/22: Patient seen for aquatic therapy today.  Treatment took place in water 2.5-4 feet deep depending upon activity.  Pt entered the pool via stairs with bilat railings. Pt requires buoyancy of water for support and to offload joints with strengthening exercises.    * walking forward/ backward, side stepping  * straddling noodle gentle skulling of arms:  cycling, hip abdct/ addct  * holding wall:  marching in place, hip openers, L stretch, (wag the tail not tolerated)  * staggered stance with horiz abdct/ addct (arms under water, no added resistance) * given information on TENS * after dried off:  applied reg Rock tape across L/R SI joints and 1 piece across sacrum with 20% stretch to decompress tissue and increase proprioception.   PATIENT EDUCATION:  Education details: aquatic therapy modifications  Person educated: Patient Education method: Probation officer Education comprehension: verbalized understanding  HOME EXERCISE PROGRAM: Access Code: [email or web address] URL: [email or web address] Date: 10/10/2022 Prepared by: Vernon Prey April Kirstie Peri  Exercises - Seated Hamstring Stretch  - 1 x daily - 7 x weekly - 2 reps - 20 hold - Standing March with Counter Support  - 1 x daily - 7 x weekly - 2 sets - 10 reps - Standing Hip Abduction with Unilateral Counter Support  - 1 x daily - 7 x weekly - 2 sets - 10 reps - Standing Hip Extension with Unilateral  Counter Support  - 1 x daily - 7 x weekly - 2 sets - 10 reps - Standing 'L' Stretch at Counter  - 1 x daily - 7 x weekly - 2 sets - 30 sec hold - Posterior Pelvic Tilt with Adduction Using Ball in Supine  - 1 x daily - 7 x weekly - 2 sets - 10 reps - Hooklying Clamshell with Resistance  - 1 x daily - 7 x weekly - 2 sets - 10 reps  Access Code: [email or web address] URL: [email or web address] Date: 10/25/2022 Prepared by: [facility] - [address]  Patient Education - TENS Unit - TENS Therapy  ASSESSMENT:  CLINICAL IMPRESSION: Pt pain level continues to fluctuate but she is showing improvement according to her objective findings.  We have focused on pool exercises and light land activity up to this point but patient seems to be tolerating more activity and may benefit from more aggressive core strengthening.  Pain is more intermittent, functional tests have improved, patient has made some minor changes to her work station.  She would do well to obtain and step stool with long handle for safe  transfer in/out of her high level chair at her work station but she states her employer will not likely provide this.  We will recertify and request continuation for a few more pool sessions along with progressing to more aggressive core strengthening.     OBJECTIVE IMPAIRMENTS: Abnormal gait, decreased activity tolerance, decreased balance, decreased coordination, decreased endurance, decreased mobility, difficulty walking, decreased ROM, decreased strength, impaired flexibility, improper body mechanics, postural dysfunction, obesity, and pain.   ACTIVITY LIMITATIONS: carrying, lifting, bending, sitting, standing, squatting, and locomotion level  PARTICIPATION LIMITATIONS: cleaning, laundry, community activity, and occupation  PERSONAL FACTORS: Age, Fitness, Profession, and 3+ comorbidities: OA, Bilat TKE, DM, A-Fib  are also affecting patient's functional outcome.   REHAB POTENTIAL:  Good  CLINICAL DECISION MAKING: Evolving/moderate complexity  EVALUATION COMPLEXITY: Moderate   GOALS: Goals reviewed with patient? Yes  SHORT TERM GOALS: Target date: 10/14/2022  Patient will demonstrate competence with basic HEP. Baseline: Goal status: MET  2.  Patient will perform a 6 MWT test to receive a baseline measurement to assess aerobic endurance and to set prolonged walking goals.  Baseline: patient shared they avoid prolonged walking due to pain  Goal status: IN PROGRESS  3.  Patient will demonstrate proper technique when lifting light weight (5-10#) objects off of the floor without increased pain.  Baseline: MOI [place] at airport  Goal status: IN PROGRESS  4.  Patient will demonstrate proper log roll technique in and out bed to avoid increased pain. Baseline: transferring was difficult  Goal status: IN PROGRESS   LONG TERM GOALS: Target date: 11/18/2022  Patient will perform advanced HEP independently upon discharge.  Baseline:  Goal status: INITIAL  2.  Patient will decrease TUG time to no greater than 10 sec in order to decrease the risk of future falls.  Baseline: 12.84 sec Goal status: Progressing ; patient was just over 10 seconds today  3.  Patient will improve their FOTO score to 56% in order to participate in ADLs and IADLs without increased pain.  Baseline: 46% Goal status: MET 58%  4.  Patient will be able to walk for 20 minutes without increased pain for community activity, such as grocery shopping.  Baseline: patient shared that they have trouble walking to and from the bathroom at work  Goal status: In progress  5.  Patient will increase global LE strength to at least 4+ to 5-/5 required for getting on and off chair at work.  Baseline: 4/5, patient reported fear of falling  Goal status: In progress   PLAN:  PT FREQUENCY: 1-2x/week  PT DURATION: 8 weeks  PLANNED INTERVENTIONS: Therapeutic exercises, Therapeutic activity,  Neuromuscular re-education, Balance training, Gait training, Patient/Family education, Self Care, Joint mobilization, Stair training, Aquatic Therapy, Dry Needling, Electrical stimulation, Spinal manipulation, Spinal mobilization, Cryotherapy, Moist heat, Taping, Traction, Ultrasound, Ionotophoresis 4mg /ml Dexamethasone, Manual therapy, and Re-evaluation.  PLAN FOR NEXT SESSION: Recertify for 6 more weeks at 1-2 times per week.  Aquatic and land therapy for LE flexibility and core strengthening. Work on walking tolerance if able.  Focus on hip strengthening as tolerated.   Victorino Dike B. Akiyah Eppolito, PT 11/11/22 11:16 AM [facility] [address], [address] [place], [place] [address] Phone # [phone] Fax [phone]

## 2022-11-11 ENCOUNTER — Other Ambulatory Visit (HOSPITAL_COMMUNITY): Payer: Self-pay

## 2022-11-16 DIAGNOSIS — E113512 Type 2 diabetes mellitus with proliferative diabetic retinopathy with macular edema, left eye: Secondary | ICD-10-CM | POA: Diagnosis not present

## 2022-11-17 ENCOUNTER — Other Ambulatory Visit (HOSPITAL_COMMUNITY): Payer: Self-pay

## 2022-11-17 MED ORDER — AMOXICILLIN 500 MG PO CAPS
2000.0000 mg | ORAL_CAPSULE | ORAL | 2 refills | Status: DC
  Filled 2022-11-17: qty 20, 5d supply, fill #0
  Filled 2022-12-02: qty 20, 5d supply, fill #1
  Filled 2023-02-08: qty 20, 5d supply, fill #2

## 2022-11-18 ENCOUNTER — Other Ambulatory Visit (HOSPITAL_COMMUNITY): Payer: Self-pay

## 2022-11-29 ENCOUNTER — Ambulatory Visit: Payer: 59 | Attending: Family Medicine | Admitting: Physical Therapy

## 2022-11-29 ENCOUNTER — Encounter: Payer: Self-pay | Admitting: Physical Therapy

## 2022-11-29 DIAGNOSIS — M6281 Muscle weakness (generalized): Secondary | ICD-10-CM | POA: Diagnosis not present

## 2022-11-29 DIAGNOSIS — R252 Cramp and spasm: Secondary | ICD-10-CM | POA: Insufficient documentation

## 2022-11-29 DIAGNOSIS — R262 Difficulty in walking, not elsewhere classified: Secondary | ICD-10-CM | POA: Insufficient documentation

## 2022-11-29 DIAGNOSIS — R293 Abnormal posture: Secondary | ICD-10-CM | POA: Insufficient documentation

## 2022-11-29 DIAGNOSIS — M5459 Other low back pain: Secondary | ICD-10-CM | POA: Insufficient documentation

## 2022-11-29 DIAGNOSIS — R2689 Other abnormalities of gait and mobility: Secondary | ICD-10-CM | POA: Insufficient documentation

## 2022-11-29 NOTE — Therapy (Signed)
OUTPATIENT PHYSICAL THERAPY THORACOLUMBAR TREATMENT   Patient Name: [name] MRN: [id number] DOB:01-Sep-1950, 72 y.o., female Today's Date: 11/29/2022  END OF SESSION:  PT End of Session - 11/29/22 1100     Visit Number 13    Date for PT Re-Evaluation 12/22/22    Authorization Type [employer]    Progress Note Due on Visit 19    PT Start Time 1015    PT Stop Time 1055    PT Time Calculation (min) 40 min    Activity Tolerance Patient tolerated treatment well    Behavior During Therapy WFL for tasks assessed/performed              Past Medical History:  Diagnosis Date   A-fib (HCC)    Anemia    hx of   Arthritis    Chest pain    Constipation    Depression    Depression    Diabetes mellitus ORAL MEDS   Diabetic retinopathy (HCC)    Diabetic retinopathy (HCC)    Dyspnea    with minimal exertion-deconditioned   Dyspnea    Dysrhythmia    a-fib   Food allergy    GERD (gastroesophageal reflux disease)    occasional   Gout    Heartburn    History of kidney stones    Hypercholesteremia    Hyperlipidemia    Hypertension    Hypertensive kidney disease    Hypothyroidism    Joint pain    Kidney problem    Knee pain, right    Left arm numbness DUE TO CERVICAL PINCHED NERVE   Obesity    OSA on CPAP    uses CPAP nightly   Osteoarthritis    Osteoarthritis    Palpitations    Pinched nerve in neck    Pleurisy    Pneumonia    hx of   PONV (postoperative nausea and vomiting)    for 3-4 days after general anesthesia approx 10-12 years ago   Sciatica    Shortness of breath    Sleep apnea    Spinal stenosis    Swelling of both lower extremities    Swelling of knee joint, right    Synovitis of knee RIGHT   Trigger finger, left    left index   Vitamin D deficiency    Past Surgical History:  Procedure Laterality Date   CESAREAN SECTION  X3   KNEE ARTHROSCOPY  04/20/2011   Procedure: ARTHROSCOPY KNEE;  Surgeon: Loanne Drilling, MD;  Location: [facility];  Service: Orthopedics;  Laterality: Right;  WITH SYNOVECTOMY   LEFT CARPAL TUNNEL / LEFT MIDDLE & RING FINGER TRIGGER RELEASE  08-26-2008   LEFT SHOULDER ARTHROSCOPY W/ DEBRIDEMENT  09-09-2003   LEFT SHOULDER ARTHROSCOPY/ LEFT THUMB TRIGGER RELEASE  02-22-2005   PHOTOCOAGULATION WITH LASER Right 03/20/2018   Procedure: PHOTOCOAGULATION WITH LASER;  Surgeon: Carmela Rima, MD;  Location: [facility] OR;  Service: Ophthalmology;  Laterality: Right;   PULLEY RELEASE LEFT LONG FINGER  07-14-2009   RADIAL HEAD ARTHROPLASTY Right 06/17/2018   Procedure: RADIAL HEAD ARTHROPLASTY;  Surgeon: Bjorn Pippin, MD;  Location: [facility] OR;  Service: Orthopedics;  Laterality: Right;   RIGHT CARPAL TUNNEL/ RIGHT THUMB TRIGGER RELEASE'S  11-28-2006   RIGHT SHOULDER ARTHROSCOPY W/ ROTATOR CUFF REPAIR  01-13-2004   SHOULDER ARTHROSCOPY DISTAL CLAVICLE EXCISION AND OPEN ROTATOR CUFF REPAIR  09-07-2004   LEFT   SHOULDER ARTHROSCOPY W/ ACROMIAL REPAIR  11-29-2005   LEFT  SYNOVECTOMY Left 06/20/2022   Procedure: left index finger and left long finger metacarpal phalangel joint syonvectomy;  Surgeon: Dairl Ponder, MD;  Location: [facility] OR;  Service: Orthopedics;  Laterality: Left;  needs 1 hour of time..   TONSILLECTOMY AND ADENOIDECTOMY  child   TOTAL KNEE ARTHROPLASTY  12-14-2009   RIGHT   TOTAL KNEE ARTHROPLASTY Left 11/05/2012   Procedure: LEFT TOTAL KNEE ARTHROPLASTY;  Surgeon: Loanne Drilling, MD;  Location: [facility];  Service: Orthopedics;  Laterality: Left;   TOTAL KNEE REVISION Right 07/29/2016   Procedure: RIGHT KNEE POLY-LINER EXCHANGE;  Surgeon: Kathryne Hitch, MD;  Location: [facility];  Service: Orthopedics;  Laterality: Right;   TRIGGER FINGER RELEASE Right 02/21/2013   Procedure: RIGHT RING A-1 PULLEY RELEASE    (MINOR PROCEDURE) ;  Surgeon: Wyn Forster., MD;  Location: [facility];  Service: Orthopedics;  Laterality: Right;   TRIGGER FINGER RELEASE Left 09/22/2016   Procedure:  RELEASE TRIGGER FINGER LEFT INDEX FINGER;  Surgeon: Kathryne Hitch, MD;  Location: [facility];  Service: Orthopedics;  Laterality: Left;   TRIGGER FINGER RELEASE Right 01/20/2017   Procedure: RELEASE TRIGGER FINGER/A-1 PULLEY RIGHT INDEX FINGER;  Surgeon: Marcene Corning, MD;  Location: [facility];  Service: Orthopedics;  Laterality: Right;   VITRECTOMY 25 GAUGE WITH SCLERAL BUCKLE Right 03/20/2018   Procedure: RIGHT EYE VITRECTOMY WITH  ENDOLASER PARENTAL PHOTOCOAGULATION 25 GAUGE;  Surgeon: Carmela Rima, MD;  Location: [facility] OR;  Service: Ophthalmology;  Laterality: Right;   WRIST ARTHROSCOPY WITH DEBRIDEMENT Right 05/01/2019   Procedure: WRIST ARTHROSCOPY WITH DEBRIDEMENT;  Surgeon: Dairl Ponder, MD;  Location: [facility];  Service: Orthopedics;  Laterality: Right;   Patient Active Problem List   Diagnosis Date Noted   Notalgia 08/03/2022   BMI 45.0-49.9, adult (HCC) 06/13/2022   Other secondary hypertension 06/13/2022   SOBOE (shortness of breath on exertion) 05/30/2022   Depression screening 05/30/2022   Morbid obesity (HCC) 05/30/2022   Class 3 severe obesity with serious comorbidity and body mass index (BMI) of 50.0 to 59.9 in adult [facility]) 05/30/2022   Paroxysmal atrial fibrillation (HCC) 10/22/2021   Hypercoagulable state due to paroxysmal atrial fibrillation (HCC) 10/22/2021   Radial head fracture, closed 06/16/2018   Closed fracture dislocation of elbow 06/16/2018   Other fatigue 07/18/2017   Shortness of breath on exertion 07/18/2017   Type 2 diabetes mellitus with retinopathy, with long-term current use of insulin (HCC) 07/18/2017   Vitamin D deficiency 07/18/2017   B12 nutritional deficiency 07/18/2017   Other specified hypothyroidism 07/18/2017   Acute pain of right knee 12/22/2016   Trigger index finger of left hand 09/22/2016   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 requiring isolated polyethylene liner exchange (HCC)  07/29/2016   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) 04/14/2016   Diabetes (HCC) 01/08/2014   Essential hypertension 01/08/2014   Hyperlipidemia 01/08/2014   Postoperative anemia due to acute blood loss 11/22/2012   Hyponatremia 11/06/2012   OA (osteoarthritis) of knee 11/05/2012   Villonodular synovitis of knee 04/20/2011    PCP: Thana Ates, MD  REFERRING PROVIDER: Rodolph Bong, MD   REFERRING DIAG: M54.42,M54.41,G89.29 (ICD-10-CM) - Chronic bilateral low back pain with bilateral sciatica   Rationale for Evaluation and Treatment: Rehabilitation  THERAPY DIAG:  Other low back pain  Muscle weakness (generalized)  Difficulty in walking, not elsewhere classified  Abnormal posture  ONSET DATE: "a couple of years", but started worsening in  May 2024  SUBJECTIVE:                                                                                                                                                                                           SUBJECTIVE STATEMENT:  I have been off for 4 days so I'm feeling pretty good.  The lumbar traction gave me mixed results - one day it was great and the next time I had increased pain afterwards.  PERTINENT HISTORY:  OA, A-Fib, DM, bilateral TKA, bilateral shoulder surgeries  PAIN:  11/11/22 Are you having pain? Yes: NPRS scale: 1/10 Pain location: low back Pain description: pain radiates from buttocks down bilateral LE past knees (Left more involved than Right) Aggravating factors: coughing, prolonged sitting and standing  Relieving factors: injection, sleep, weight shifting    PRECAUTIONS: None  WEIGHT BEARING RESTRICTIONS: No  FALLS:  Has patient fallen in last 6 months? No  LIVING ENVIRONMENT: Lives with: lives alone Lives in: House/apartment Stairs: No Has following equipment at home: Single point cane  OCCUPATION: Works in admissions in [employer]  PLOF: Independent and Leisure: crafting  PATIENT  GOALS: To be able to walk without hunching over and less pain.  NEXT MD VISIT: as needed  OBJECTIVE:   DIAGNOSTIC FINDINGS:  Lumbar MRI on 08/28/2022: IMPRESSION: 1. Lumbar spine spondylosis as described above. 2. No acute osseous injury of the lumbar spine.  Lumbar Radiograph on 08/09/2022: IMPRESSION: Multilevel degenerative changes of the lumbar spine as discussed above.  PATIENT SURVEYS:  Eval:  FOTO 40 (projected 41 by visit 12) 11/11/22: FOTO 58 (answers based on patients subjective report)  COGNITION: Overall cognitive status: Within functional limits for tasks assessed     SENSATION: WFL  MUSCLE LENGTH: Hamstrings: Tightness bilaterally   POSTURE: rounded shoulders, forward head, increased lumbar lordosis, increased thoracic kyphosis, and posterior pelvic tilt   LUMBAR ROM:  Some decreased motion with pain  LOWER EXTREMITY ROM:   WFL  LOWER EXTREMITY MMT:   Eval:  Bilateral LE strength grossly 4/5 throughout with some pain noted  LUMBAR SPECIAL TESTS:  Slump test: Positive  FUNCTIONAL TESTS:  09/22/2022: 5 times sit to stand: 10.15 sec Timed up and go (TUG): 12.84 sec  11/10/22: 5 times sit to stand: 9.00 sec Timed up and go (TUG): 10.98 sec  GAIT: Distance walked: 200 ft Assistive device utilized: None Level of assistance: Complete Independence Comments: Pt with antalgic gait  TODAY'S TREATMENT:    9/17: NuStep L6 x 6' PT present to discuss status Seated HS stretch bil 2x20" Supine PPT 10x5" Supine hooklying alt LE march yellow loop band x20, TA  contraction Supine stability ball crunches x10 Supine legs on stability ball D2 flexion with blue ball x10 each Modified Dead bugs x20 Supine straight leg raise + TA contraction x10 each leg Clamshells yellow loop x10 each leg Marches at barre x20 Squats at barre x8 Sitting Rows at pulleys 10# 2x10  11/10/22: Recert assessment completed Reviewed current land HEP Discussed DC plan and safe  activities for home, discussed work station and reviewed the various options for making her station more ergonomic  11/08/22: Patient seen for aquatic therapy today.  Treatment took place in water 2.5-4 feet deep depending upon activity.  Pt entered the pool via stairs with bilat UE on rails independently. Pt requires buoyancy of water for support and to offload joints with strengthening exercises.    * walking forward/ backward  * side stepping with arm addct with yellow hand floats (yellow too much for shoulders) * TrA set with short blue noodle pull down to thighs x 15 * staggered stance with kick board row 2 x 10 * holding hand floats: alternating single leg clams, leg swings into hip flex/ext x 10 each * return to walking forward/ backward with alternating row motion  * L stretch with feet on wall, x 3 * STS at bench in water with blue step under feet x 10, cues for core engagement, hip hinge, forward arm reach * straddling noodle:  cycling with gentle breast stroke arms  10/25/22: Patient seen for aquatic therapy today.  Treatment took place in water 2.5-4 feet deep depending upon activity.  Pt entered the pool via stairs with bilat railings. Pt requires buoyancy of water for support and to offload joints with strengthening exercises.    * walking forward/ backward, side stepping  * straddling noodle gentle skulling of arms:  cycling, hip abdct/ addct  * holding wall:  marching in place, hip openers, L stretch, (wag the tail not tolerated)  * staggered stance with horiz abdct/ addct (arms under water, no added resistance) * given information on TENS * after dried off:  applied reg Rock tape across L/R SI joints and 1 piece across sacrum with 20% stretch to decompress tissue and increase proprioception.   PATIENT EDUCATION:  Education details: aquatic therapy modifications  Person educated: Patient Education method: Probation officer Education comprehension:  verbalized understanding  HOME EXERCISE PROGRAM: Access Code: [email or web address] URL: [email or web address] Date: 10/10/2022 Prepared by: Vernon Prey April Kirstie Peri  Exercises - Seated Hamstring Stretch  - 1 x daily - 7 x weekly - 2 reps - 20 hold - Standing March with Counter Support  - 1 x daily - 7 x weekly - 2 sets - 10 reps - Standing Hip Abduction with Unilateral Counter Support  - 1 x daily - 7 x weekly - 2 sets - 10 reps - Standing Hip Extension with Unilateral Counter Support  - 1 x daily - 7 x weekly - 2 sets - 10 reps - Standing 'L' Stretch at Counter  - 1 x daily - 7 x weekly - 2 sets - 30 sec hold - Posterior Pelvic Tilt with Adduction Using Ball in Supine  - 1 x daily - 7 x weekly - 2 sets - 10 reps - Hooklying Clamshell with Resistance  - 1 x daily - 7 x weekly - 2 sets - 10 reps  Access Code: [email or web address] URL: [email or web address] Date: 10/25/2022 Prepared by: [facility] [address]  Patient Education - TENS Unit - TENS Therapy  ASSESSMENT:  CLINICAL IMPRESSION: Today's treatment session focused on flexibility and core strengthening. Incorporated modified dead bugs, D2 shoulder flexion, and straight leg raises. Incorporated TA contraction with all supine strengthening exercises. Patient required verbal and tactile cues for correct performance. Opted to perform modified dead bugs due to patient's weak core. When performing squats at barre patient experienced low back pain with bil posterior LE pain extending to mid-calf when ascending and verbalized leg fatigue. She was only able to tolerate 8 reps. Patient's legs were fatigued towards the end of treatment session so rows were performed seated instead of standing. Will continue to incorporate a mix of standing and seated exercises to help improve patient's standing tolerance. Patient will continue to benefit from skilled therapy to address current impairments and functional limitations  listed below.   OBJECTIVE IMPAIRMENTS: Abnormal gait, decreased activity tolerance, decreased balance, decreased coordination, decreased endurance, decreased mobility, difficulty walking, decreased ROM, decreased strength, impaired flexibility, improper body mechanics, postural dysfunction, obesity, and pain.   ACTIVITY LIMITATIONS: carrying, lifting, bending, sitting, standing, squatting, and locomotion level  PARTICIPATION LIMITATIONS: cleaning, laundry, community activity, and occupation  PERSONAL FACTORS: Age, Fitness, Profession, and 3+ comorbidities: OA, Bilat TKE, DM, A-Fib  are also affecting patient's functional outcome.   REHAB POTENTIAL: Good  CLINICAL DECISION MAKING: Evolving/moderate complexity  EVALUATION COMPLEXITY: Moderate   GOALS: Goals reviewed with patient? Yes  SHORT TERM GOALS: Target date: 10/14/2022  Patient will demonstrate competence with basic HEP. Baseline: Goal status: MET  2.  Patient will perform a 6 MWT test to receive a baseline measurement to assess aerobic endurance and to set prolonged walking goals.  Baseline: patient shared they avoid prolonged walking due to pain  Goal status: IN PROGRESS  3.  Patient will demonstrate proper technique when lifting light weight (5-10#) objects off of the floor without increased pain.  Baseline: MOI was [place] suitcase at airport  Goal status: IN PROGRESS  4.  Patient will demonstrate proper log roll technique in and out bed to avoid increased pain. Baseline: transferring was difficult  Goal status: IN PROGRESS   LONG TERM GOALS: Target date: 11/18/2022  Patient will perform advanced HEP independently upon discharge.  Baseline:  Goal status: INITIAL  2.  Patient will decrease TUG time to no greater than 10 sec in order to decrease the risk of future falls.  Baseline: 12.84 sec Goal status: Progressing ; patient was just over 10 seconds today  3.  Patient will improve their FOTO score to 56% in order  to participate in ADLs and IADLs without increased pain.  Baseline: 46% Goal status: MET 58%  4.  Patient will be able to walk for 20 minutes without increased pain for community activity, such as grocery shopping.  Baseline: patient shared that they have trouble walking to and from the bathroom at work  Goal status: In progress  5.  Patient will increase global LE strength to at least 4+ to 5-/5 required for getting on and off chair at work.  Baseline: 4/5, patient reported fear of falling  Goal status: In progress   PLAN:  PT FREQUENCY: 1-2x/week  PT DURATION: 8 weeks  PLANNED INTERVENTIONS: Therapeutic exercises, Therapeutic activity, Neuromuscular re-education, Balance training, Gait training, Patient/Family education, Self Care, Joint mobilization, Stair training, Aquatic Therapy, Dry Needling, Electrical stimulation, Spinal manipulation, Spinal mobilization, Cryotherapy, Moist heat, Taping, Traction, Ultrasound, Ionotophoresis 4mg /ml Dexamethasone, Manual therapy, and Re-evaluation.  PLAN FOR NEXT SESSION: Recertify for 6 more weeks at 1-2 times per week.  Aquatic and  land therapy for LE flexibility and core strengthening. Work on walking tolerance if able.  Focus on hip strengthening as tolerated.   Loistine Simas Malaiyah Achorn, PT 11/29/22 11:04 AM  Co-treated with Claude Manges, PT  [facility] [address] [place], [place] [address] Phone # [phone] Fax [phone]

## 2022-11-30 ENCOUNTER — Encounter (INDEPENDENT_AMBULATORY_CARE_PROVIDER_SITE_OTHER): Payer: Self-pay | Admitting: Family Medicine

## 2022-11-30 ENCOUNTER — Ambulatory Visit (INDEPENDENT_AMBULATORY_CARE_PROVIDER_SITE_OTHER): Payer: 59 | Admitting: Family Medicine

## 2022-11-30 VITALS — BP 143/59 | HR 69 | Temp 98.1°F | Ht 60.0 in | Wt 249.0 lb

## 2022-11-30 DIAGNOSIS — Z6841 Body Mass Index (BMI) 40.0 and over, adult: Secondary | ICD-10-CM

## 2022-11-30 DIAGNOSIS — E669 Obesity, unspecified: Secondary | ICD-10-CM

## 2022-11-30 DIAGNOSIS — I10 Essential (primary) hypertension: Secondary | ICD-10-CM | POA: Diagnosis not present

## 2022-12-01 ENCOUNTER — Encounter: Payer: Self-pay | Admitting: Physical Therapy

## 2022-12-01 ENCOUNTER — Ambulatory Visit: Payer: 59 | Admitting: Physical Therapy

## 2022-12-01 DIAGNOSIS — R293 Abnormal posture: Secondary | ICD-10-CM | POA: Diagnosis not present

## 2022-12-01 DIAGNOSIS — R2689 Other abnormalities of gait and mobility: Secondary | ICD-10-CM | POA: Diagnosis not present

## 2022-12-01 DIAGNOSIS — R262 Difficulty in walking, not elsewhere classified: Secondary | ICD-10-CM

## 2022-12-01 DIAGNOSIS — M6281 Muscle weakness (generalized): Secondary | ICD-10-CM

## 2022-12-01 DIAGNOSIS — R252 Cramp and spasm: Secondary | ICD-10-CM

## 2022-12-01 DIAGNOSIS — M5459 Other low back pain: Secondary | ICD-10-CM | POA: Diagnosis not present

## 2022-12-01 NOTE — Progress Notes (Signed)
Chief Complaint:   OBESITY [name] is here to discuss her progress with her obesity treatment plan along with follow-up of her obesity related diagnoses. [name] is on the Category 2 Plan and states she is following her eating plan approximately 80% of the time. [name] states she is walking for 30 minutes 2 times per week.  Today's visit was #: 7 Starting weight: 258 lbs Starting date: 05/30/2022 Today's weight: 249 lbs Today's date: 12/01/2022 Total lbs lost to date: 9 Total lbs lost since last in-office visit: 4  Interim History: Patient continues to do well with her weight loss on her category 2 plan.  She has increased her walking and she has been doing physical therapy for back pain, and she is feeling better overall.  Subjective:   1. Essential hypertension Patient's blood pressure is elevated today.  Her blood pressure has been elevated on and off and she is working on her diet and weight loss.  Assessment/Plan:   1. Essential hypertension Patient will continue to work on her diet and will continue to monitor.  2. BMI 45.0-49.9, adult (HCC)  3. Obesity, Beginning BMI 50.39 [name] is currently in the action stage of change. As such, her goal is to continue with weight loss efforts. She has agreed to the Category 2 Plan.   Various meal planning ideas were discussed in depth. Exercise goals: As is.   Behavioral modification strategies: increasing lean protein intake and meal planning and cooking strategies.  [name] has agreed to follow-up with our clinic in 4 weeks. She was informed of the importance of frequent follow-up visits to maximize her success with intensive lifestyle modifications for her multiple health conditions.   Objective:   Blood pressure (!) 143/59, pulse 69, temperature 98.1 F (36.7 C), height 5' (1.524 m), weight 249 lb (112.9 kg), SpO2 95%. Body mass index is 48.63 kg/m.  Lab Results  Component Value Date   CREATININE 1.27 (H) 05/30/2022    BUN 34 (H) 05/30/2022   NA 139 05/30/2022   K 4.9 05/30/2022   CL 103 05/30/2022   CO2 17 (L) 05/30/2022   Lab Results  Component Value Date   ALT 13 05/30/2022   AST 19 05/30/2022   ALKPHOS 87 05/30/2022   BILITOT <0.2 05/30/2022   Lab Results  Component Value Date   HGBA1C 7.7 (H) 05/30/2022   HGBA1C 7.4 (H) 11/28/2017   HGBA1C 6.8 (H) 07/18/2017   HGBA1C 7.7 (H) 07/20/2016   HGBA1C 7.9 09/18/2014   Lab Results  Component Value Date   INSULIN 24.3 05/30/2022   Lab Results  Component Value Date   TSH 0.790 05/30/2022   Lab Results  Component Value Date   CHOL 111 05/30/2022   HDL 35 (L) 05/30/2022   LDLCALC 55 05/30/2022   TRIG 114 05/30/2022   Lab Results  Component Value Date   VD25OH 34.8 05/30/2022   VD25OH 31.4 11/28/2017   VD25OH 30.5 07/18/2017   Lab Results  Component Value Date   WBC 10.0 05/30/2022   HGB 12.4 05/30/2022   HCT 40.0 05/30/2022   MCV 88 05/30/2022   PLT 378 05/30/2022   No results found for: "IRON", "TIBC", "FERRITIN"  Attestation Statements:   Reviewed by clinician on day of visit: allergies, medications, problem list, medical history, surgical history, family history, social history, and previous encounter notes.  Time spent on visit including pre-visit chart review and post-visit care and charting was 30 minutes.   Trude Mcburney, am  acting as transcriptionist for Quillian Quince, MD.  I have reviewed the above documentation for accuracy and completeness, and I agree with the above. -  Quillian Quince, MD

## 2022-12-01 NOTE — Therapy (Signed)
OUTPATIENT PHYSICAL THERAPY THORACOLUMBAR TREATMENT   Patient Name: [name] MRN: [id number] DOB:18-Oct-1950, 72 y.o., female Today's Date: 12/01/2022  END OF SESSION:  PT End of Session - 12/01/22 1703     Visit Number 14    Date for PT Re-Evaluation 12/22/22    Authorization Type [employer]    Progress Note Due on Visit 19    PT Start Time 1617    PT Stop Time 1658    PT Time Calculation (min) 41 min    Activity Tolerance Patient tolerated treatment well    Behavior During Therapy WFL for tasks assessed/performed               Past Medical History:  Diagnosis Date   A-fib (HCC)    Anemia    hx of   Arthritis    Chest pain    Constipation    Depression    Depression    Diabetes mellitus ORAL MEDS   Diabetic retinopathy (HCC)    Diabetic retinopathy (HCC)    Dyspnea    with minimal exertion-deconditioned   Dyspnea    Dysrhythmia    a-fib   Food allergy    GERD (gastroesophageal reflux disease)    occasional   Gout    Heartburn    History of kidney stones    Hypercholesteremia    Hyperlipidemia    Hypertension    Hypertensive kidney disease    Hypothyroidism    Joint pain    Kidney problem    Knee pain, right    Left arm numbness DUE TO CERVICAL PINCHED NERVE   Obesity    OSA on CPAP    uses CPAP nightly   Osteoarthritis    Osteoarthritis    Palpitations    Pinched nerve in neck    Pleurisy    Pneumonia    hx of   PONV (postoperative nausea and vomiting)    for 3-4 days after general anesthesia approx 10-12 years ago   Sciatica    Shortness of breath    Sleep apnea    Spinal stenosis    Swelling of both lower extremities    Swelling of knee joint, right    Synovitis of knee RIGHT   Trigger finger, left    left index   Vitamin D deficiency    Past Surgical History:  Procedure Laterality Date   CESAREAN SECTION  X3   KNEE ARTHROSCOPY  04/20/2011   Procedure: ARTHROSCOPY KNEE;  Surgeon: Loanne Drilling, MD;  Location: [facility];  Service: Orthopedics;  Laterality: Right;  WITH SYNOVECTOMY   LEFT CARPAL TUNNEL / LEFT MIDDLE & RING FINGER TRIGGER RELEASE  08-26-2008   LEFT SHOULDER ARTHROSCOPY W/ DEBRIDEMENT  09-09-2003   LEFT SHOULDER ARTHROSCOPY/ LEFT THUMB TRIGGER RELEASE  02-22-2005   PHOTOCOAGULATION WITH LASER Right 03/20/2018   Procedure: PHOTOCOAGULATION WITH LASER;  Surgeon: Carmela Rima, MD;  Location: [facility] OR;  Service: Ophthalmology;  Laterality: Right;   PULLEY RELEASE LEFT LONG FINGER  07-14-2009   RADIAL HEAD ARTHROPLASTY Right 06/17/2018   Procedure: RADIAL HEAD ARTHROPLASTY;  Surgeon: Bjorn Pippin, MD;  Location: [facility] OR;  Service: Orthopedics;  Laterality: Right;   RIGHT CARPAL TUNNEL/ RIGHT THUMB TRIGGER RELEASE'S  11-28-2006   RIGHT SHOULDER ARTHROSCOPY W/ ROTATOR CUFF REPAIR  01-13-2004   SHOULDER ARTHROSCOPY DISTAL CLAVICLE EXCISION AND OPEN ROTATOR CUFF REPAIR  09-07-2004   LEFT   SHOULDER ARTHROSCOPY W/ ACROMIAL REPAIR  11-29-2005   LEFT  SYNOVECTOMY Left 06/20/2022   Procedure: left index finger and left long finger metacarpal phalangel joint syonvectomy;  Surgeon: Dairl Ponder, MD;  Location: [facility] OR;  Service: Orthopedics;  Laterality: Left;  needs 1 hour of time..   TONSILLECTOMY AND ADENOIDECTOMY  child   TOTAL KNEE ARTHROPLASTY  12-14-2009   RIGHT   TOTAL KNEE ARTHROPLASTY Left 11/05/2012   Procedure: LEFT TOTAL KNEE ARTHROPLASTY;  Surgeon: Loanne Drilling, MD;  Location: [facility];  Service: Orthopedics;  Laterality: Left;   TOTAL KNEE REVISION Right 07/29/2016   Procedure: RIGHT KNEE POLY-LINER EXCHANGE;  Surgeon: Kathryne Hitch, MD;  Location: [facility];  Service: Orthopedics;  Laterality: Right;   TRIGGER FINGER RELEASE Right 02/21/2013   Procedure: RIGHT RING A-1 PULLEY RELEASE    (MINOR PROCEDURE) ;  Surgeon: Wyn Forster., MD;  Location: [facility];  Service: Orthopedics;  Laterality: Right;   TRIGGER FINGER RELEASE Left 09/22/2016    Procedure: RELEASE TRIGGER FINGER LEFT INDEX FINGER;  Surgeon: Kathryne Hitch, MD;  Location: [facility] OR;  Service: Orthopedics;  Laterality: Left;   TRIGGER FINGER RELEASE Right 01/20/2017   Procedure: RELEASE TRIGGER FINGER/A-1 PULLEY RIGHT INDEX FINGER;  Surgeon: Marcene Corning, MD;  Location: [facility];  Service: Orthopedics;  Laterality: Right;   VITRECTOMY 25 GAUGE WITH SCLERAL BUCKLE Right 03/20/2018   Procedure: RIGHT EYE VITRECTOMY WITH  ENDOLASER PARENTAL PHOTOCOAGULATION 25 GAUGE;  Surgeon: Carmela Rima, MD;  Location: [facility] OR;  Service: Ophthalmology;  Laterality: Right;   WRIST ARTHROSCOPY WITH DEBRIDEMENT Right 05/01/2019   Procedure: WRIST ARTHROSCOPY WITH DEBRIDEMENT;  Surgeon: Dairl Ponder, MD;  Location: [facility];  Service: Orthopedics;  Laterality: Right;   Patient Active Problem List   Diagnosis Date Noted   Notalgia 08/03/2022   BMI 45.0-49.9, adult (HCC) 06/13/2022   Other secondary hypertension 06/13/2022   SOBOE (shortness of breath on exertion) 05/30/2022   Depression screening 05/30/2022   Morbid obesity (HCC) 05/30/2022   Class 3 severe obesity with serious comorbidity and body mass index (BMI) of 50.0 to 59.9 in adult [facility]) 05/30/2022   Paroxysmal atrial fibrillation (HCC) 10/22/2021   Hypercoagulable state due to paroxysmal atrial fibrillation (HCC) 10/22/2021   Radial head fracture, closed 06/16/2018   Closed fracture dislocation of elbow 06/16/2018   Other fatigue 07/18/2017   Shortness of breath on exertion 07/18/2017   Type 2 diabetes mellitus with retinopathy, with long-term current use of insulin (HCC) 07/18/2017   Vitamin D deficiency 07/18/2017   B12 nutritional deficiency 07/18/2017   Other specified hypothyroidism 07/18/2017   Acute pain of right knee 12/22/2016   Trigger index finger of left hand 09/22/2016   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 requiring isolated polyethylene liner  exchange (HCC) 07/29/2016   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) 04/14/2016   Diabetes (HCC) 01/08/2014   Essential hypertension 01/08/2014   Hyperlipidemia 01/08/2014   Postoperative anemia due to acute blood loss 11/22/2012   Hyponatremia 11/06/2012   OA (osteoarthritis) of knee 11/05/2012   Villonodular synovitis of knee 04/20/2011    PCP: Thana Ates, MD  REFERRING PROVIDER: Rodolph Bong, MD   REFERRING DIAG: M54.42,M54.41,G89.29 (ICD-10-CM) - Chronic bilateral low back pain with bilateral sciatica   Rationale for Evaluation and Treatment: Rehabilitation  THERAPY DIAG:  Other low back pain  Muscle weakness (generalized)  Difficulty in walking, not elsewhere classified  Abnormal posture  Cramp and spasm  Other abnormalities of gait and mobility  ONSET DATE: "a couple of years", but started worsening in May 2024  SUBJECTIVE:                                                                                                                                                                                           SUBJECTIVE STATEMENT:  I am doing good today. I felt great after last treatment session I did not have any increased pain. My back pain is currently 2/10.  PERTINENT HISTORY:  OA, A-Fib, DM, bilateral TKA, bilateral shoulder surgeries  PAIN:  12/01/22 Are you having pain? Yes: NPRS scale: 2/10 Pain location: low back Pain description: pain radiates from buttocks down bilateral LE past knees (Left more involved than Right) Aggravating factors: coughing, prolonged sitting and standing  Relieving factors: injection, sleep, weight shifting    PRECAUTIONS: None  WEIGHT BEARING RESTRICTIONS: No  FALLS:  Has patient fallen in last 6 months? No  LIVING ENVIRONMENT: Lives with: lives alone Lives in: House/apartment Stairs: No Has following equipment at home: Single point cane  OCCUPATION: Works in admissions in [employer]  PLOF:  Independent and Leisure: crafting  PATIENT GOALS: To be able to walk without hunching over and less pain.  NEXT MD VISIT: as needed  OBJECTIVE:   DIAGNOSTIC FINDINGS:  Lumbar MRI on 08/28/2022: IMPRESSION: 1. Lumbar spine spondylosis as described above. 2. No acute osseous injury of the lumbar spine.  Lumbar Radiograph on 08/09/2022: IMPRESSION: Multilevel degenerative changes of the lumbar spine as discussed above.  PATIENT SURVEYS:  Eval:  FOTO 40 (projected 47 by visit 12) 11/11/22: FOTO 58 (answers based on patients subjective report)  COGNITION: Overall cognitive status: Within functional limits for tasks assessed     SENSATION: WFL  MUSCLE LENGTH: Hamstrings: Tightness bilaterally   POSTURE: rounded shoulders, forward head, increased lumbar lordosis, increased thoracic kyphosis, and posterior pelvic tilt   LUMBAR ROM:  Some decreased motion with pain  LOWER EXTREMITY ROM:   WFL  LOWER EXTREMITY MMT:   Eval:  Bilateral LE strength grossly 4/5 throughout with some pain noted  LUMBAR SPECIAL TESTS:  Slump test: Positive  FUNCTIONAL TESTS:  09/22/2022: 5 times sit to stand: 10.15 sec Timed up and go (TUG): 12.84 sec  11/10/22: 5 times sit to stand: 9.00 sec Timed up and go (TUG): 10.98 sec  GAIT: Distance walked: 200 ft Assistive device utilized: None Level of assistance: Complete Independence Comments: Pt with antalgic gait  TODAY'S TREATMENT:    9/19: NuStep L6 x 6' PT present to discuss status Seated HS stretch bil 2x20" Supine PPT 2x10 Supine hooklying alt LE march  x20, TA contraction Supine  stability ball press 2x10 Lower Trunk Rotations x10 each way Supine straight leg raise + TA contraction x10 each leg Clamshells yellow loop x10 each leg Standing calf stretch 2x20sec each Marches at barre x20 Sitting Rows at pulleys 10# 2x10 Sitting Shoulder extension at pulleys 10# 2x10   9/17: NuStep L6 x 6' PT present to discuss status Seated HS  stretch bil 2x20" Supine PPT 10x5" Supine hooklying alt LE march yellow loop band x20, TA contraction Supine stability ball crunches x10 Supine legs on stability ball D2 flexion with blue ball x10 each Modified Dead bugs x20 Supine straight leg raise + TA contraction x10 each leg Clamshells yellow loop x10 each leg Marches at barre x20 Squats at [employer] Rows at pulleys 10# 2x10  11/10/22: Recert assessment completed Reviewed current land HEP Discussed DC plan and safe activities for home, discussed work station and reviewed the various options for making her station more ergonomic  11/08/22: Patient seen for aquatic therapy today.  Treatment took place in water 2.5-4 feet deep depending upon activity.  Pt entered the pool via stairs with bilat UE on rails independently. Pt requires buoyancy of water for support and to offload joints with strengthening exercises.    * walking forward/ backward  * side stepping with arm addct with yellow hand floats (yellow too much for shoulders) * TrA set with short blue noodle pull down to thighs x 15 * staggered stance with kick board row 2 x 10 * holding hand floats: alternating single leg clams, leg swings into hip flex/ext x 10 each * return to walking forward/ backward with alternating row motion  * L stretch with feet on wall, x 3 * STS at bench in water with blue step under feet x 10, cues for core engagement, hip hinge, forward arm reach * straddling noodle:  cycling with gentle breast stroke arms   PATIENT EDUCATION:  Education details: aquatic therapy modifications  Person educated: Patient Education method: Medical illustrator and Handouts Education comprehension: verbalized understanding  HOME EXERCISE PROGRAM: Access Code: [email or web address] URL: [email or web address] Date: 10/10/2022 Prepared by: Vernon Prey April Kirstie Peri  Exercises - Seated Hamstring Stretch  - 1 x daily - 7 x weekly - 2 reps - 20  hold - Standing March with Counter Support  - 1 x daily - 7 x weekly - 2 sets - 10 reps - Standing Hip Abduction with Unilateral Counter Support  - 1 x daily - 7 x weekly - 2 sets - 10 reps - Standing Hip Extension with Unilateral Counter Support  - 1 x daily - 7 x weekly - 2 sets - 10 reps - Standing 'L' Stretch at Counter  - 1 x daily - 7 x weekly - 2 sets - 30 sec hold - Posterior Pelvic Tilt with Adduction Using Ball in Supine  - 1 x daily - 7 x weekly - 2 sets - 10 reps - Hooklying Clamshell with Resistance  - 1 x daily - 7 x weekly - 2 sets - 10 reps  Access Code: [email or web address] URL: [email or web address] Date: 10/25/2022 Prepared by: [facility] - [facility] - [address]  Patient Education - TENS Unit - TENS Therapy  ASSESSMENT:  CLINICAL IMPRESSION: Today's treatment session focused on flexibility and core strengthening. Patient responded well to previous treatment session and did not experience any increased pain. Patient tolerated treatment session well and did not verbalize any increased pain. Required verbal cues for correct form for clamshells and stability ball press exercises.  Patient will benefit from skilled PT to address the below impairments and improve overall function.   OBJECTIVE IMPAIRMENTS: Abnormal gait, decreased activity tolerance, decreased balance, decreased coordination, decreased endurance, decreased mobility, difficulty walking, decreased ROM, decreased strength, impaired flexibility, improper body mechanics, postural dysfunction, obesity, and pain.   ACTIVITY LIMITATIONS: carrying, lifting, bending, sitting, standing, squatting, and locomotion level  PARTICIPATION LIMITATIONS: cleaning, laundry, community activity, and occupation  PERSONAL FACTORS: Age, Fitness, Profession, and 3+ comorbidities: OA, Bilat TKE, DM, A-Fib  are also affecting patient's functional outcome.   REHAB POTENTIAL: Good  CLINICAL DECISION MAKING: Evolving/moderate  complexity  EVALUATION COMPLEXITY: Moderate   GOALS: Goals reviewed with patient? Yes  SHORT TERM GOALS: Target date: 10/14/2022  Patient will demonstrate competence with basic HEP. Baseline: Goal status: MET  2.  Patient will perform a 6 MWT test to receive a baseline measurement to assess aerobic endurance and to set prolonged walking goals.  Baseline: patient shared they avoid prolonged walking due to pain  Goal status: IN PROGRESS  3.  Patient will demonstrate proper technique when lifting light weight (5-10#) objects off of the floor without increased pain.  Baseline: MOI [place] suitcase at airport  Goal status: IN PROGRESS  4.  Patient will demonstrate proper log roll technique in and out bed to avoid increased pain. Baseline: transferring was difficult  Goal status: IN PROGRESS   LONG TERM GOALS: Target date: 11/18/2022  Patient will perform advanced HEP independently upon discharge.  Baseline:  Goal status: INITIAL  2.  Patient will decrease TUG time to no greater than 10 sec in order to decrease the risk of future falls.  Baseline: 12.84 sec Goal status: Progressing ; patient was just over 10 seconds today  3.  Patient will improve their FOTO score to 56% in order to participate in ADLs and IADLs without increased pain.  Baseline: 46% Goal status: MET 58%  4.  Patient will be able to walk for 20 minutes without increased pain for community activity, such as grocery shopping.  Baseline: patient shared that they have trouble walking to and from the bathroom at work  Goal status: In progress  5.  Patient will increase global LE strength to at least 4+ to 5-/5 required for getting on and off chair at work.  Baseline: 4/5, patient reported fear of falling  Goal status: In progress   PLAN:  PT FREQUENCY: 1-2x/week  PT DURATION: 8 weeks  PLANNED INTERVENTIONS: Therapeutic exercises, Therapeutic activity, Neuromuscular re-education, Balance training, Gait  training, Patient/Family education, Self Care, Joint mobilization, Stair training, Aquatic Therapy, Dry Needling, Electrical stimulation, Spinal manipulation, Spinal mobilization, Cryotherapy, Moist heat, Taping, Traction, Ultrasound, Ionotophoresis 4mg /ml Dexamethasone, Manual therapy, and Re-evaluation.  PLAN FOR NEXT SESSION: Recertify for 6 more weeks at 1-2 times per week. Continue focusing on hip and core strengthening.    Claude Manges, PT 12/01/22 5:10 PM  [facility] [address], [address] [place], [place] [address] Phone # [phone] Fax [phone]

## 2022-12-02 ENCOUNTER — Other Ambulatory Visit (HOSPITAL_COMMUNITY): Payer: Self-pay

## 2022-12-02 ENCOUNTER — Other Ambulatory Visit: Payer: Self-pay

## 2022-12-07 ENCOUNTER — Ambulatory Visit: Payer: 59

## 2022-12-07 ENCOUNTER — Other Ambulatory Visit (HOSPITAL_COMMUNITY): Payer: Self-pay

## 2022-12-07 ENCOUNTER — Other Ambulatory Visit: Payer: Self-pay

## 2022-12-07 DIAGNOSIS — R293 Abnormal posture: Secondary | ICD-10-CM | POA: Diagnosis not present

## 2022-12-07 DIAGNOSIS — R252 Cramp and spasm: Secondary | ICD-10-CM | POA: Diagnosis not present

## 2022-12-07 DIAGNOSIS — R262 Difficulty in walking, not elsewhere classified: Secondary | ICD-10-CM | POA: Diagnosis not present

## 2022-12-07 DIAGNOSIS — M5459 Other low back pain: Secondary | ICD-10-CM

## 2022-12-07 DIAGNOSIS — R2689 Other abnormalities of gait and mobility: Secondary | ICD-10-CM | POA: Diagnosis not present

## 2022-12-07 DIAGNOSIS — M6281 Muscle weakness (generalized): Secondary | ICD-10-CM | POA: Diagnosis not present

## 2022-12-07 MED ORDER — ATORVASTATIN CALCIUM 40 MG PO TABS
40.0000 mg | ORAL_TABLET | Freq: Every day | ORAL | 3 refills | Status: DC
  Filled 2022-12-07: qty 90, 90d supply, fill #0
  Filled 2023-03-09: qty 90, 90d supply, fill #1
  Filled 2023-06-05: qty 90, 90d supply, fill #2
  Filled 2023-09-12: qty 90, 90d supply, fill #3

## 2022-12-07 NOTE — Therapy (Signed)
OUTPATIENT PHYSICAL THERAPY THORACOLUMBAR TREATMENT   Patient Name: [name] MRN: [id number] DOB:[birth year], 72 y.o., female Today's Date: 12/07/2022  END OF SESSION:  PT End of Session - 12/07/22 1401     Visit Number 15    Date for PT Re-Evaluation 12/22/22    Authorization Type [employer]    Progress Note Due on Visit 19    PT Start Time 1401    PT Stop Time 1445    PT Time Calculation (min) 44 min    Activity Tolerance Patient tolerated treatment well    Behavior During Therapy WFL for tasks assessed/performed               Past Medical History:  Diagnosis Date   A-fib (HCC)    Anemia    hx of   Arthritis    Chest pain    Constipation    Depression    Depression    Diabetes mellitus ORAL MEDS   Diabetic retinopathy (HCC)    Diabetic retinopathy (HCC)    Dyspnea    with minimal exertion-deconditioned   Dyspnea    Dysrhythmia    a-fib   Food allergy    GERD (gastroesophageal reflux disease)    occasional   Gout    Heartburn    History of kidney stones    Hypercholesteremia    Hyperlipidemia    Hypertension    Hypertensive kidney disease    Hypothyroidism    Joint pain    Kidney problem    Knee pain, right    Left arm numbness DUE TO CERVICAL PINCHED NERVE   Obesity    OSA on CPAP    uses CPAP nightly   Osteoarthritis    Osteoarthritis    Palpitations    Pinched nerve in neck    Pleurisy    Pneumonia    hx of   PONV (postoperative nausea and vomiting)    for 3-4 days after general anesthesia approx 10-12 years ago   Sciatica    Shortness of breath    Sleep apnea    Spinal stenosis    Swelling of both lower extremities    Swelling of knee joint, right    Synovitis of knee RIGHT   Trigger finger, left    left index   Vitamin D deficiency    Past Surgical History:  Procedure Laterality Date   CESAREAN SECTION  X3   KNEE ARTHROSCOPY  04/20/2011   Procedure: ARTHROSCOPY KNEE;  Surgeon: Loanne Drilling, MD;  Location: [facility]  [place];  Service: Orthopedics;  Laterality: Right;  WITH SYNOVECTOMY   LEFT CARPAL TUNNEL / LEFT MIDDLE & RING FINGER TRIGGER RELEASE  08-26-2008   LEFT SHOULDER ARTHROSCOPY W/ DEBRIDEMENT  09-09-2003   LEFT SHOULDER ARTHROSCOPY/ LEFT THUMB TRIGGER RELEASE  02-22-2005   PHOTOCOAGULATION WITH LASER Right 03/20/2018   Procedure: PHOTOCOAGULATION WITH LASER;  Surgeon: Carmela Rima, MD;  Location: [facility] OR;  Service: Ophthalmology;  Laterality: Right;   PULLEY RELEASE LEFT LONG FINGER  07-14-2009   RADIAL HEAD ARTHROPLASTY Right 06/17/2018   Procedure: RADIAL HEAD ARTHROPLASTY;  Surgeon: Bjorn Pippin, MD;  Location: [facility] OR;  Service: Orthopedics;  Laterality: Right;   RIGHT CARPAL TUNNEL/ RIGHT THUMB TRIGGER RELEASE'S  11-28-2006   RIGHT SHOULDER ARTHROSCOPY W/ ROTATOR CUFF REPAIR  01-13-2004   SHOULDER ARTHROSCOPY DISTAL CLAVICLE EXCISION AND OPEN ROTATOR CUFF REPAIR  09-07-2004   LEFT   SHOULDER ARTHROSCOPY W/ ACROMIAL REPAIR  11-29-2005   LEFT  SYNOVECTOMY Left 06/20/2022   Procedure: left index finger and left long finger metacarpal phalangel joint syonvectomy;  Surgeon: Dairl Ponder, MD;  Location: [facility] OR;  Service: Orthopedics;  Laterality: Left;  needs 1 hour of time..   TONSILLECTOMY AND ADENOIDECTOMY  child   TOTAL KNEE ARTHROPLASTY  12-14-2009   RIGHT   TOTAL KNEE ARTHROPLASTY Left 11/05/2012   Procedure: LEFT TOTAL KNEE ARTHROPLASTY;  Surgeon: Loanne Drilling, MD;  Location: [facility];  Service: Orthopedics;  Laterality: Left;   TOTAL KNEE REVISION Right 07/29/2016   Procedure: RIGHT KNEE POLY-LINER EXCHANGE;  Surgeon: Kathryne Hitch, MD;  Location: [facility];  Service: Orthopedics;  Laterality: Right;   TRIGGER FINGER RELEASE Right 02/21/2013   Procedure: RIGHT RING A-1 PULLEY RELEASE    (MINOR PROCEDURE) ;  Surgeon: Wyn Forster., MD;  Location: [facility];  Service: Orthopedics;  Laterality: Right;   TRIGGER FINGER RELEASE Left 09/22/2016    Procedure: RELEASE TRIGGER FINGER LEFT INDEX FINGER;  Surgeon: Kathryne Hitch, MD;  Location: [facility] OR;  Service: Orthopedics;  Laterality: Left;   TRIGGER FINGER RELEASE Right 01/20/2017   Procedure: RELEASE TRIGGER FINGER/A-1 PULLEY RIGHT INDEX FINGER;  Surgeon: Marcene Corning, MD;  Location: [facility];  Service: Orthopedics;  Laterality: Right;   VITRECTOMY 25 GAUGE WITH SCLERAL BUCKLE Right 03/20/2018   Procedure: RIGHT EYE VITRECTOMY WITH  ENDOLASER PARENTAL PHOTOCOAGULATION 25 GAUGE;  Surgeon: Carmela Rima, MD;  Location: [facility] OR;  Service: Ophthalmology;  Laterality: Right;   WRIST ARTHROSCOPY WITH DEBRIDEMENT Right 05/01/2019   Procedure: WRIST ARTHROSCOPY WITH DEBRIDEMENT;  Surgeon: Dairl Ponder, MD;  Location: [facility];  Service: Orthopedics;  Laterality: Right;   Patient Active Problem List   Diagnosis Date Noted   Notalgia 08/03/2022   BMI 45.0-49.9, adult (HCC) 06/13/2022   Other secondary hypertension 06/13/2022   SOBOE (shortness of breath on exertion) 05/30/2022   Depression screening 05/30/2022   Morbid obesity (HCC) 05/30/2022   Class 3 severe obesity with serious comorbidity and body mass index (BMI) of 50.0 to 59.9 in adult [facility]) 05/30/2022   Paroxysmal atrial fibrillation (HCC) 10/22/2021   Hypercoagulable state due to paroxysmal atrial fibrillation (HCC) 10/22/2021   Radial head fracture, closed 06/16/2018   Closed fracture dislocation of elbow 06/16/2018   Other fatigue 07/18/2017   Shortness of breath on exertion 07/18/2017   Type 2 diabetes mellitus with retinopathy, with long-term current use of insulin (HCC) 07/18/2017   Vitamin D deficiency 07/18/2017   B12 nutritional deficiency 07/18/2017   Other specified hypothyroidism 07/18/2017   Acute pain of right knee 12/22/2016   Trigger index finger of left hand 09/22/2016   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 requiring isolated polyethylene liner  exchange (HCC) 07/29/2016   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) 04/14/2016   Diabetes (HCC) 01/08/2014   Essential hypertension 01/08/2014   Hyperlipidemia 01/08/2014   Postoperative anemia due to acute blood loss 11/22/2012   Hyponatremia 11/06/2012   OA (osteoarthritis) of knee 11/05/2012   Villonodular synovitis of knee 04/20/2011    PCP: Thana Ates, MD  REFERRING PROVIDER: Rodolph Bong, MD   REFERRING DIAG: M54.42,M54.41,G89.29 (ICD-10-CM) - Chronic bilateral low back pain with bilateral sciatica   Rationale for Evaluation and Treatment: Rehabilitation  THERAPY DIAG:  Other low back pain  Muscle weakness (generalized)  Difficulty in walking, not elsewhere classified  Abnormal posture  Cramp and spasm  ONSET DATE: "a couple of years",  but started worsening in May 2024  SUBJECTIVE:                                                                                                                                                                                           SUBJECTIVE STATEMENT:  It's off and on.  I still have trouble with getting in and out of my chair at work.  "I'm about 50% better overall"  PERTINENT HISTORY:  OA, A-Fib, DM, bilateral TKA, bilateral shoulder surgeries  PAIN:  12/01/22 Are you having pain? Yes: NPRS scale: 2/10 Pain location: low back Pain description: pain radiates from buttocks down bilateral LE past knees (Left more involved than Right) Aggravating factors: coughing, prolonged sitting and standing  Relieving factors: injection, sleep, weight shifting    PRECAUTIONS: None  WEIGHT BEARING RESTRICTIONS: No  FALLS:  Has patient fallen in last 6 months? No  LIVING ENVIRONMENT: Lives with: lives alone Lives in: House/apartment Stairs: No Has following equipment at home: Single point cane  OCCUPATION: Works in admissions in [employer]  PLOF: Independent and Leisure: crafting  PATIENT GOALS: To be able to  walk without hunching over and less pain.  NEXT MD VISIT: as needed  OBJECTIVE:   DIAGNOSTIC FINDINGS:  Lumbar MRI on 08/28/2022: IMPRESSION: 1. Lumbar spine spondylosis as described above. 2. No acute osseous injury of the lumbar spine.  Lumbar Radiograph on 08/09/2022: IMPRESSION: Multilevel degenerative changes of the lumbar spine as discussed above.  PATIENT SURVEYS:  Eval:  FOTO 40 (projected 41 by visit 12) 11/11/22: FOTO 58 (answers based on patients subjective report)  COGNITION: Overall cognitive status: Within functional limits for tasks assessed     SENSATION: WFL  MUSCLE LENGTH: Hamstrings: Tightness bilaterally   POSTURE: rounded shoulders, forward head, increased lumbar lordosis, increased thoracic kyphosis, and posterior pelvic tilt   LUMBAR ROM:  Some decreased motion with pain  LOWER EXTREMITY ROM:   WFL  LOWER EXTREMITY MMT:   Eval:  Bilateral LE strength grossly 4/5 throughout with some pain noted  LUMBAR SPECIAL TESTS:  Slump test: Positive  FUNCTIONAL TESTS:  09/22/2022: 5 times sit to stand: 10.15 sec Timed up and go (TUG): 12.84 sec  11/10/22: 5 times sit to stand: 9.00 sec Timed up and go (TUG): 10.98 sec  GAIT: Distance walked: 200 ft Assistive device utilized: None Level of assistance: Complete Independence Comments: Pt with antalgic gait  TODAY'S TREATMENT:    9/25: Standing hamstring stretch 3 x 30 sec Standing quad/hip flexor stretch 3 x 30 sec Seated PPT x 20 Supine PPT 2x10 Supine hooklying alt LE march  x20, TA contraction Supine stability  ball press 2x10 Lower Trunk Rotations x10 each way Seated ball press onto knees (purple ball) x 10   9/19: NuStep L6 x 6' PT present to discuss status Seated HS stretch bil 2x20" Supine PPT 2x10 Supine hooklying alt LE march  x20, TA contraction Supine stability ball press 2x10 Lower Trunk Rotations x10 each way Supine straight leg raise + TA contraction x10 each  leg Clamshells yellow loop x10 each leg Standing calf stretch 2x20sec each Marches at barre x20 Sitting Rows at pulleys 10# 2x10 Sitting Shoulder extension at pulleys 10# 2x10   9/17: NuStep L6 x 6' PT present to discuss status Seated HS stretch bil 2x20" Supine PPT 10x5" Supine hooklying alt LE march yellow loop band x20, TA contraction Supine stability ball crunches x10 Supine legs on stability ball D2 flexion with blue ball x10 each Modified Dead bugs x20 Supine straight leg raise + TA contraction x10 each leg Clamshells yellow loop x10 each leg Marches at barre x20 Squats at [employer] Rows at pulleys 10# 2x10    PATIENT EDUCATION:  Education details: aquatic therapy modifications  Person educated: Patient Education method: Medical illustrator and Handouts Education comprehension: verbalized understanding  HOME EXERCISE PROGRAM: Access Code: [email or web address] URL: [email or web address] Date: 10/10/2022 Prepared by: Vernon Prey April Kirstie Peri  Exercises - Seated Hamstring Stretch  - 1 x daily - 7 x weekly - 2 reps - 20 hold - Standing March with Counter Support  - 1 x daily - 7 x weekly - 2 sets - 10 reps - Standing Hip Abduction with Unilateral Counter Support  - 1 x daily - 7 x weekly - 2 sets - 10 reps - Standing Hip Extension with Unilateral Counter Support  - 1 x daily - 7 x weekly - 2 sets - 10 reps - Standing 'L' Stretch at Counter  - 1 x daily - 7 x weekly - 2 sets - 30 sec hold - Posterior Pelvic Tilt with Adduction Using Ball in Supine  - 1 x daily - 7 x weekly - 2 sets - 10 reps - Hooklying Clamshell with Resistance  - 1 x daily - 7 x weekly - 2 sets - 10 reps  Access Code: [email or web address] URL: [email or web address] Date: 10/25/2022 Prepared by: [facility] - Lyn[facility]  Patient Education - TENS Unit - TENS Therapy  ASSESSMENT:  CLINICAL IMPRESSION: Brenden is making slow but positive progress.  She  continues to need flexibility primarily but would benefit from gaining better core control.   Patient will benefit from skilled PT to address the below impairments and improve overall function.   OBJECTIVE IMPAIRMENTS: Abnormal gait, decreased activity tolerance, decreased balance, decreased coordination, decreased endurance, decreased mobility, difficulty walking, decreased ROM, decreased strength, impaired flexibility, improper body mechanics, postural dysfunction, obesity, and pain.   ACTIVITY LIMITATIONS: carrying, lifting, bending, sitting, standing, squatting, and locomotion level  PARTICIPATION LIMITATIONS: cleaning, laundry, community activity, and occupation  PERSONAL FACTORS: Age, Fitness, Profession, and 3+ comorbidities: OA, Bilat TKE, DM, A-Fib  are also affecting patient's functional outcome.   REHAB POTENTIAL: Good  CLINICAL DECISION MAKING: Evolving/moderate complexity  EVALUATION COMPLEXITY: Moderate   GOALS: Goals reviewed with patient? Yes  SHORT TERM GOALS: Target date: 10/14/2022  Patient will demonstrate competence with basic HEP. Baseline: Goal status: MET  2.  Patient will perform a 6 MWT test to receive a baseline measurement to assess aerobic endurance and to set prolonged walking goals.  Baseline: patient shared they avoid prolonged walking due  to pain  Goal status: IN PROGRESS  3.  Patient will demonstrate proper technique when lifting light weight (5-10#) objects off of the floor without increased pain.  Baseline: MOI was [place] suitcase at airport  Goal status: IN PROGRESS  4.  Patient will demonstrate proper log roll technique in and out bed to avoid increased pain. Baseline: transferring was difficult  Goal status: IN PROGRESS   LONG TERM GOALS: Target date: 11/18/2022  Patient will perform advanced HEP independently upon discharge.  Baseline:  Goal status: INITIAL  2.  Patient will decrease TUG time to no greater than 10 sec in order to  decrease the risk of future falls.  Baseline: 12.84 sec Goal status: Progressing ; patient was just over 10 seconds today  3.  Patient will improve their FOTO score to 56% in order to participate in ADLs and IADLs without increased pain.  Baseline: 46% Goal status: MET 58%  4.  Patient will be able to walk for 20 minutes without increased pain for community activity, such as grocery shopping.  Baseline: patient shared that they have trouble walking to and from the bathroom at work  Goal status: In progress  5.  Patient will increase global LE strength to at least 4+ to 5-/5 required for getting on and off chair at work.  Baseline: 4/5, patient reported fear of falling  Goal status: In progress   PLAN:  PT FREQUENCY: 1-2x/week  PT DURATION: 8 weeks  PLANNED INTERVENTIONS: Therapeutic exercises, Therapeutic activity, Neuromuscular re-education, Balance training, Gait training, Patient/Family education, Self Care, Joint mobilization, Stair training, Aquatic Therapy, Dry Needling, Electrical stimulation, Spinal manipulation, Spinal mobilization, Cryotherapy, Moist heat, Taping, Traction, Ultrasound, Ionotophoresis 4mg /ml Dexamethasone, Manual therapy, and Re-evaluation.  PLAN FOR NEXT SESSION: Progress core work and continue flexibility exercises.  Seated weighted ball core work would be a good option for [employer].     Victorino Dike B. Minahil Quinlivan, PT 12/07/22 2:56 PM [facility] [address] [place], [place] [address] Phone # [phone] Fax [phone]

## 2022-12-08 ENCOUNTER — Other Ambulatory Visit: Payer: Self-pay

## 2022-12-08 ENCOUNTER — Other Ambulatory Visit (HOSPITAL_COMMUNITY): Payer: Self-pay

## 2022-12-08 MED ORDER — COMIRNATY 30 MCG/0.3ML IM SUSY
PREFILLED_SYRINGE | INTRAMUSCULAR | 0 refills | Status: DC
  Filled 2022-12-08: qty 0.3, 1d supply, fill #0

## 2022-12-11 DIAGNOSIS — G4733 Obstructive sleep apnea (adult) (pediatric): Secondary | ICD-10-CM | POA: Diagnosis not present

## 2022-12-12 ENCOUNTER — Other Ambulatory Visit (HOSPITAL_COMMUNITY): Payer: Self-pay

## 2022-12-12 DIAGNOSIS — E119 Type 2 diabetes mellitus without complications: Secondary | ICD-10-CM | POA: Diagnosis not present

## 2022-12-12 DIAGNOSIS — Z794 Long term (current) use of insulin: Secondary | ICD-10-CM | POA: Diagnosis not present

## 2022-12-12 DIAGNOSIS — E039 Hypothyroidism, unspecified: Secondary | ICD-10-CM | POA: Diagnosis not present

## 2022-12-12 DIAGNOSIS — Z87442 Personal history of urinary calculi: Secondary | ICD-10-CM | POA: Diagnosis not present

## 2022-12-12 MED ORDER — LOSARTAN POTASSIUM 50 MG PO TABS
50.0000 mg | ORAL_TABLET | Freq: Every day | ORAL | 4 refills | Status: DC
  Filled 2022-12-12: qty 90, 90d supply, fill #0
  Filled 2023-02-26: qty 90, 90d supply, fill #1
  Filled 2023-05-30: qty 90, 90d supply, fill #2
  Filled 2023-09-12: qty 90, 90d supply, fill #3
  Filled 2023-12-05: qty 90, 90d supply, fill #4

## 2022-12-13 ENCOUNTER — Other Ambulatory Visit (HOSPITAL_COMMUNITY): Payer: Self-pay

## 2022-12-14 ENCOUNTER — Encounter (HOSPITAL_BASED_OUTPATIENT_CLINIC_OR_DEPARTMENT_OTHER): Payer: Self-pay | Admitting: Physical Therapy

## 2022-12-14 ENCOUNTER — Ambulatory Visit (HOSPITAL_BASED_OUTPATIENT_CLINIC_OR_DEPARTMENT_OTHER): Payer: 59 | Attending: Family Medicine | Admitting: Physical Therapy

## 2022-12-14 DIAGNOSIS — M5459 Other low back pain: Secondary | ICD-10-CM | POA: Insufficient documentation

## 2022-12-14 DIAGNOSIS — R262 Difficulty in walking, not elsewhere classified: Secondary | ICD-10-CM | POA: Diagnosis not present

## 2022-12-14 DIAGNOSIS — M6281 Muscle weakness (generalized): Secondary | ICD-10-CM | POA: Insufficient documentation

## 2022-12-14 NOTE — Therapy (Signed)
OUTPATIENT PHYSICAL THERAPY THORACOLUMBAR TREATMENT   Patient Name: [name] MRN: [id number] DOB:10-18-1950, 72 y.o., female Today's Date: 12/14/2022  END OF SESSION:  PT End of Session - 12/14/22 1549     Visit Number 16    Date for PT Re-Evaluation 12/22/22    Authorization Type [employer]    Progress Note Due on Visit 19    PT Start Time 1546    PT Stop Time 1630    PT Time Calculation (min) 44 min    Activity Tolerance Patient tolerated treatment well    Behavior During Therapy WFL for tasks assessed/performed                Past Medical History:  Diagnosis Date   A-fib (HCC)    Anemia    hx of   Arthritis    Chest pain    Constipation    Depression    Depression    Diabetes mellitus ORAL MEDS   Diabetic retinopathy (HCC)    Diabetic retinopathy (HCC)    Dyspnea    with minimal exertion-deconditioned   Dyspnea    Dysrhythmia    a-fib   Food allergy    GERD (gastroesophageal reflux disease)    occasional   Gout    Heartburn    History of kidney stones    Hypercholesteremia    Hyperlipidemia    Hypertension    Hypertensive kidney disease    Hypothyroidism    Joint pain    Kidney problem    Knee pain, right    Left arm numbness DUE TO CERVICAL PINCHED NERVE   Obesity    OSA on CPAP    uses CPAP nightly   Osteoarthritis    Osteoarthritis    Palpitations    Pinched nerve in neck    Pleurisy    Pneumonia    hx of   PONV (postoperative nausea and vomiting)    for 3-4 days after general anesthesia approx 10-12 years ago   Sciatica    Shortness of breath    Sleep apnea    Spinal stenosis    Swelling of both lower extremities    Swelling of knee joint, right    Synovitis of knee RIGHT   Trigger finger, left    left index   Vitamin D deficiency    Past Surgical History:  Procedure Laterality Date   CESAREAN SECTION  X3   KNEE ARTHROSCOPY  04/20/2011   Procedure: ARTHROSCOPY KNEE;  Surgeon: Loanne Drilling, MD;  Location: [facility];  Service: Orthopedics;  Laterality: Right;  WITH SYNOVECTOMY   LEFT CARPAL TUNNEL / LEFT MIDDLE & RING FINGER TRIGGER RELEASE  08-26-2008   LEFT SHOULDER ARTHROSCOPY W/ DEBRIDEMENT  09-09-2003   LEFT SHOULDER ARTHROSCOPY/ LEFT THUMB TRIGGER RELEASE  02-22-2005   PHOTOCOAGULATION WITH LASER Right 03/20/2018   Procedure: PHOTOCOAGULATION WITH LASER;  Surgeon: Carmela Rima, MD;  Location: [facility] OR;  Service: Ophthalmology;  Laterality: Right;   PULLEY RELEASE LEFT LONG FINGER  07-14-2009   RADIAL HEAD ARTHROPLASTY Right 06/17/2018   Procedure: RADIAL HEAD ARTHROPLASTY;  Surgeon: Bjorn Pippin, MD;  Location: [facility] OR;  Service: Orthopedics;  Laterality: Right;   RIGHT CARPAL TUNNEL/ RIGHT THUMB TRIGGER RELEASE'S  11-28-2006   RIGHT SHOULDER ARTHROSCOPY W/ ROTATOR CUFF REPAIR  01-13-2004   SHOULDER ARTHROSCOPY DISTAL CLAVICLE EXCISION AND OPEN ROTATOR CUFF REPAIR  09-07-2004   LEFT   SHOULDER ARTHROSCOPY W/ ACROMIAL REPAIR  11-29-2005  LEFT   SYNOVECTOMY Left 06/20/2022   Procedure: left index finger and left long finger metacarpal phalangel joint syonvectomy;  Surgeon: Dairl Ponder, MD;  Location: [facility] OR;  Service: Orthopedics;  Laterality: Left;  needs 1 hour of time..   TONSILLECTOMY AND ADENOIDECTOMY  child   TOTAL KNEE ARTHROPLASTY  12-14-2009   RIGHT   TOTAL KNEE ARTHROPLASTY Left 11/05/2012   Procedure: LEFT TOTAL KNEE ARTHROPLASTY;  Surgeon: Loanne Drilling, MD;  Location: [facility];  Service: Orthopedics;  Laterality: Left;   TOTAL KNEE REVISION Right 07/29/2016   Procedure: RIGHT KNEE POLY-LINER EXCHANGE;  Surgeon: Kathryne Hitch, MD;  Location: [facility];  Service: Orthopedics;  Laterality: Right;   TRIGGER FINGER RELEASE Right 02/21/2013   Procedure: RIGHT RING A-1 PULLEY RELEASE    (MINOR PROCEDURE) ;  Surgeon: Wyn Forster., MD;  Location: [facility];  Service: Orthopedics;  Laterality: Right;   TRIGGER FINGER RELEASE Left 09/22/2016    Procedure: RELEASE TRIGGER FINGER LEFT INDEX FINGER;  Surgeon: Kathryne Hitch, MD;  Location: [facility] OR;  Service: Orthopedics;  Laterality: Left;   TRIGGER FINGER RELEASE Right 01/20/2017   Procedure: RELEASE TRIGGER FINGER/A-1 PULLEY RIGHT INDEX FINGER;  Surgeon: Marcene Corning, MD;  Location: [facility];  Service: Orthopedics;  Laterality: Right;   VITRECTOMY 25 GAUGE WITH SCLERAL BUCKLE Right 03/20/2018   Procedure: RIGHT EYE VITRECTOMY WITH  ENDOLASER PARENTAL PHOTOCOAGULATION 25 GAUGE;  Surgeon: Carmela Rima, MD;  Location: [facility] OR;  Service: Ophthalmology;  Laterality: Right;   WRIST ARTHROSCOPY WITH DEBRIDEMENT Right 05/01/2019   Procedure: WRIST ARTHROSCOPY WITH DEBRIDEMENT;  Surgeon: Dairl Ponder, MD;  Location: [facility];  Service: Orthopedics;  Laterality: Right;   Patient Active Problem List   Diagnosis Date Noted   Notalgia 08/03/2022   BMI 45.0-49.9, adult (HCC) 06/13/2022   Other secondary hypertension 06/13/2022   SOBOE (shortness of breath on exertion) 05/30/2022   Depression screening 05/30/2022   Morbid obesity (HCC) 05/30/2022   Class 3 severe obesity with serious comorbidity and body mass index (BMI) of 50.0 to 59.9 in adult [facility]) 05/30/2022   Paroxysmal atrial fibrillation (HCC) 10/22/2021   Hypercoagulable state due to paroxysmal atrial fibrillation (HCC) 10/22/2021   Radial head fracture, closed 06/16/2018   Closed fracture dislocation of elbow 06/16/2018   Other fatigue 07/18/2017   Shortness of breath on exertion 07/18/2017   Type 2 diabetes mellitus with retinopathy, with long-term current use of insulin (HCC) 07/18/2017   Vitamin D deficiency 07/18/2017   B12 nutritional deficiency 07/18/2017   Other specified hypothyroidism 07/18/2017   Acute pain of right knee 12/22/2016   Trigger index finger of left hand 09/22/2016   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 requiring isolated polyethylene liner  exchange (HCC) 07/29/2016   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) 04/14/2016   Diabetes (HCC) 01/08/2014   Essential hypertension 01/08/2014   Hyperlipidemia 01/08/2014   Postoperative anemia due to acute blood loss 11/22/2012   Hyponatremia 11/06/2012   OA (osteoarthritis) of knee 11/05/2012   Villonodular synovitis of knee 04/20/2011    PCP: Thana Ates, MD  REFERRING PROVIDER: Rodolph Bong, MD   REFERRING DIAG: M54.42,M54.41,G89.29 (ICD-10-CM) - Chronic bilateral low back pain with bilateral sciatica   Rationale for Evaluation and Treatment: Rehabilitation  THERAPY DIAG:  Other low back pain  Muscle weakness (generalized)  Difficulty in walking, not elsewhere classified  ONSET DATE: "a couple of years", but started worsening in  May 2024  SUBJECTIVE:                                                                                                                                                                                           SUBJECTIVE STATEMENT:  "My back is a little soar today I lifted something wrong"  PERTINENT HISTORY:  OA, A-Fib, DM, bilateral TKA, bilateral shoulder surgeries  PAIN:  12/01/22 Are you having pain? Yes: NPRS scale: 3/10 Pain location: low back Pain description: pain radiates from buttocks down bilateral LE past knees (Left more involved than Right) Aggravating factors: coughing, prolonged sitting and standing  Relieving factors: injection, sleep, weight shifting    PRECAUTIONS: None  WEIGHT BEARING RESTRICTIONS: No  FALLS:  Has patient fallen in last 6 months? No  LIVING ENVIRONMENT: Lives with: lives alone Lives in: House/apartment Stairs: No Has following equipment at home: Single point cane  OCCUPATION: Works in admissions in [employer]  PLOF: Independent and Leisure: crafting  PATIENT GOALS: To be able to walk without hunching over and less pain.  NEXT MD VISIT: as needed  OBJECTIVE:    DIAGNOSTIC FINDINGS:  Lumbar MRI on 08/28/2022: IMPRESSION: 1. Lumbar spine spondylosis as described above. 2. No acute osseous injury of the lumbar spine.  Lumbar Radiograph on 08/09/2022: IMPRESSION: Multilevel degenerative changes of the lumbar spine as discussed above.  PATIENT SURVEYS:  Eval:  FOTO 40 (projected 107 by visit 12) 11/11/22: FOTO 58 (answers based on patients subjective report)  COGNITION: Overall cognitive status: Within functional limits for tasks assessed     SENSATION: WFL  MUSCLE LENGTH: Hamstrings: Tightness bilaterally   POSTURE: rounded shoulders, forward head, increased lumbar lordosis, increased thoracic kyphosis, and posterior pelvic tilt   LUMBAR ROM:  Some decreased motion with pain  LOWER EXTREMITY ROM:   WFL  LOWER EXTREMITY MMT:   Eval:  Bilateral LE strength grossly 4/5 throughout with some pain noted  LUMBAR SPECIAL TESTS:  Slump test: Positive  FUNCTIONAL TESTS:  09/22/2022: 5 times sit to stand: 10.15 sec Timed up and go (TUG): 12.84 sec  11/10/22: 5 times sit to stand: 9.00 sec Timed up and go (TUG): 10.98 sec  GAIT: Distance walked: 200 ft Assistive device utilized: None Level of assistance: Complete Independence Comments: Pt with antalgic gait  TODAY'S TREATMENT:    12/15/22  Patient seen for aquatic therapy today.  Treatment took place in water 2.5-4 feet deep depending upon activity.  Pt entered the pool via stairs with bilat UE on rails independently. Pt requires buoyancy of water for support and to offload joints with strengthening exercises.     *  walking forward/ backward  * side stepping with arm addct with rainbow HB * TrA set with full hollow blue noodle pull down to thighs x 10ea in wide stance then staggered * staggered stance then wide with kick board row 2 sets x 10 ea position * L stretch with feet on wall, x 3 * holding hand floats: alternating single leg clams, leg swings into hip flex/ext x 10  each *hip hinge x 10 *STS from 3rd step x 8 gaining immediate standing balance indep.  - from 4th step x 5 * straddling noodle:  cycling with gentle breast stroke arms  9/25: Standing hamstring stretch 3 x 30 sec Standing quad/hip flexor stretch 3 x 30 sec Seated PPT x 20 Supine PPT 2x10 Supine hooklying alt LE march  x20, TA contraction Supine stability ball press 2x10 Lower Trunk Rotations x10 each way Seated ball press onto knees (purple ball) x 10   9/19: NuStep L6 x 6' PT present to discuss status Seated HS stretch bil 2x20" Supine PPT 2x10 Supine hooklying alt LE march  x20, TA contraction Supine stability ball press 2x10 Lower Trunk Rotations x10 each way Supine straight leg raise + TA contraction x10 each leg Clamshells yellow loop x10 each leg Standing calf stretch 2x20sec each Marches at barre x20 Sitting Rows at pulleys 10# 2x10 Sitting Shoulder extension at pulleys 10# 2x10   9/17: NuStep L6 x 6' PT present to discuss status Seated HS stretch bil 2x20" Supine PPT 10x5" Supine hooklying alt LE march yellow loop band x20, TA contraction Supine stability ball crunches x10 Supine legs on stability ball D2 flexion with blue ball x10 each Modified Dead bugs x20 Supine straight leg raise + TA contraction x10 each leg Clamshells yellow loop x10 each leg Marches at barre x20 Squats at [employer] Rows at pulleys 10# 2x10    PATIENT EDUCATION:  Education details: aquatic therapy modifications  Person educated: Patient Education method: Medical illustrator and Handouts Education comprehension: verbalized understanding  HOME EXERCISE PROGRAM: Access Code: [email or web address] URL: [email or web address] Date: 10/10/2022 Prepared by: Vernon Prey April Kirstie Peri  Exercises - Seated Hamstring Stretch  - 1 x daily - 7 x weekly - 2 reps - 20 hold - Standing March with Counter Support  - 1 x daily - 7 x weekly - 2 sets - 10 reps - Standing Hip  Abduction with Unilateral Counter Support  - 1 x daily - 7 x weekly - 2 sets - 10 reps - Standing Hip Extension with Unilateral Counter Support  - 1 x daily - 7 x weekly - 2 sets - 10 reps - Standing 'L' Stretch at Counter  - 1 x daily - 7 x weekly - 2 sets - 30 sec hold - Posterior Pelvic Tilt with Adduction Using Ball in Supine  - 1 x daily - 7 x weekly - 2 sets - 10 reps - Hooklying Clamshell with Resistance  - 1 x daily - 7 x weekly - 2 sets - 10 reps  Access Code: [email or web address] URL: [email or web address] Date: 10/25/2022 Prepared by: [facility] [address]  Patient Education - TENS Unit - TENS Therapy  Access Code: [email or web address] URL: [email or web address] Date: 12/14/2022 Prepared by: Geni Bers   Exercises - Noodle press  - 1 x daily - 1-2 x weekly - 1-3 sets - 10 reps - Side Stepping with Hand Floats  - Kick Board Row  - 1 x daily - 1-3 x weekly - 1-3 sets -  10 reps - Standing Hip Flexion Extension at [employer]  - 1 x daily - 1-3 x weekly - 1-3 sets - 10 reps - Sit to Stand  - 1 x daily - 1-3 x weekly - 1-3 sets - 10 reps - Standing Hip Hinge  - 1 x daily - 1-3 x weekly - 1-3 sets - 10 reps - Seated Straddle on Flotation Forward Breast Stroke Arms and Bicycle Legs   ASSESSMENT:  CLINICAL IMPRESSION: Began creating final aquatic HEP. Will have laminated and issued next/last session.  She tolerates progression of aquatic exercises well.  Able to gain LB stretch with proper execution of hip hinging. Immediate standing balance challenge completed without LOB. Goals ongoing    OBJECTIVE IMPAIRMENTS: Abnormal gait, decreased activity tolerance, decreased balance, decreased coordination, decreased endurance, decreased mobility, difficulty walking, decreased ROM, decreased strength, impaired flexibility, improper body mechanics, postural dysfunction, obesity, and pain.   ACTIVITY LIMITATIONS: carrying, lifting, bending, sitting, standing,  squatting, and locomotion level  PARTICIPATION LIMITATIONS: cleaning, laundry, community activity, and occupation  PERSONAL FACTORS: Age, Fitness, Profession, and 3+ comorbidities: OA, Bilat TKE, DM, A-Fib  are also affecting patient's functional outcome.   REHAB POTENTIAL: Good  CLINICAL DECISION MAKING: Evolving/moderate complexity  EVALUATION COMPLEXITY: Moderate   GOALS: Goals reviewed with patient? Yes  SHORT TERM GOALS: Target date: 10/14/2022  Patient will demonstrate competence with basic HEP. Baseline: Goal status: MET  2.  Patient will perform a 6 MWT test to receive a baseline measurement to assess aerobic endurance and to set prolonged walking goals.  Baseline: patient shared they avoid prolonged walking due to pain  Goal status: IN PROGRESS  3.  Patient will demonstrate proper technique when lifting light weight (5-10#) objects off of the floor without increased pain.  Baseline: MOI was [place] suitcase at airport  Goal status: IN PROGRESS  4.  Patient will demonstrate proper log roll technique in and out bed to avoid increased pain. Baseline: transferring was difficult  Goal status: IN PROGRESS   LONG TERM GOALS: Target date: 11/18/2022  Patient will perform advanced HEP independently upon discharge.  Baseline:  Goal status: INITIAL  2.  Patient will decrease TUG time to no greater than 10 sec in order to decrease the risk of future falls.  Baseline: 12.84 sec Goal status: Progressing ; patient was just over 10 seconds today  3.  Patient will improve their FOTO score to 56% in order to participate in ADLs and IADLs without increased pain.  Baseline: 46% Goal status: MET 58%  4.  Patient will be able to walk for 20 minutes without increased pain for community activity, such as grocery shopping.  Baseline: patient shared that they have trouble walking to and from the bathroom at work  Goal status: In progress  5.  Patient will increase global LE strength  to at least 4+ to 5-/5 required for getting on and off chair at work.  Baseline: 4/5, patient reported fear of falling  Goal status: In progress   PLAN:  PT FREQUENCY: 1-2x/week  PT DURATION: 8 weeks  PLANNED INTERVENTIONS: Therapeutic exercises, Therapeutic activity, Neuromuscular re-education, Balance training, Gait training, Patient/Family education, Self Care, Joint mobilization, Stair training, Aquatic Therapy, Dry Needling, Electrical stimulation, Spinal manipulation, Spinal mobilization, Cryotherapy, Moist heat, Taping, Traction, Ultrasound, Ionotophoresis 4mg /ml Dexamethasone, Manual therapy, and Re-evaluation.  PLAN FOR NEXT SESSION: Progress core work and continue flexibility exercises.  Seated weighted ball core work would be a good option for [employer].  Corrie Dandy Tomma Lightning) Jordynn Perrier MPT 12/14/22 3:50 PM

## 2022-12-15 DIAGNOSIS — S63659A Sprain of metacarpophalangeal joint of unspecified finger, initial encounter: Secondary | ICD-10-CM | POA: Diagnosis not present

## 2022-12-19 ENCOUNTER — Encounter (HOSPITAL_BASED_OUTPATIENT_CLINIC_OR_DEPARTMENT_OTHER): Payer: Self-pay | Admitting: Physical Therapy

## 2022-12-19 ENCOUNTER — Ambulatory Visit (HOSPITAL_BASED_OUTPATIENT_CLINIC_OR_DEPARTMENT_OTHER): Payer: 59 | Admitting: Physical Therapy

## 2022-12-19 DIAGNOSIS — M6281 Muscle weakness (generalized): Secondary | ICD-10-CM | POA: Diagnosis not present

## 2022-12-19 DIAGNOSIS — R262 Difficulty in walking, not elsewhere classified: Secondary | ICD-10-CM

## 2022-12-19 DIAGNOSIS — M5459 Other low back pain: Secondary | ICD-10-CM

## 2022-12-19 NOTE — Therapy (Signed)
OUTPATIENT PHYSICAL THERAPY THORACOLUMBAR TREATMENT   Patient Name: [name] MRN: [id number] DOB:[birth year], 72 y.o., female Today's Date: 12/19/2022  END OF SESSION:  PT End of Session - 12/19/22 1739     Visit Number 17    Date for PT Re-Evaluation 12/22/22    Authorization Type [employer]    Progress Note Due on Visit 19    PT Start Time 1547    PT Stop Time 1630    PT Time Calculation (min) 43 min    Activity Tolerance Patient tolerated treatment well    Behavior During Therapy WFL for tasks assessed/performed                 Past Medical History:  Diagnosis Date   A-fib (HCC)    Anemia    hx of   Arthritis    Chest pain    Constipation    Depression    Depression    Diabetes mellitus ORAL MEDS   Diabetic retinopathy (HCC)    Diabetic retinopathy (HCC)    Dyspnea    with minimal exertion-deconditioned   Dyspnea    Dysrhythmia    a-fib   Food allergy    GERD (gastroesophageal reflux disease)    occasional   Gout    Heartburn    History of kidney stones    Hypercholesteremia    Hyperlipidemia    Hypertension    Hypertensive kidney disease    Hypothyroidism    Joint pain    Kidney problem    Knee pain, right    Left arm numbness DUE TO CERVICAL PINCHED NERVE   Obesity    OSA on CPAP    uses CPAP nightly   Osteoarthritis    Osteoarthritis    Palpitations    Pinched nerve in neck    Pleurisy    Pneumonia    hx of   PONV (postoperative nausea and vomiting)    for 3-4 days after general anesthesia approx 10-12 years ago   Sciatica    Shortness of breath    Sleep apnea    Spinal stenosis    Swelling of both lower extremities    Swelling of knee joint, right    Synovitis of knee RIGHT   Trigger finger, left    left index   Vitamin D deficiency    Past Surgical History:  Procedure Laterality Date   CESAREAN SECTION  X3   KNEE ARTHROSCOPY  04/20/2011   Procedure: ARTHROSCOPY KNEE;  Surgeon: Loanne Drilling, MD;  Location:  [facility];  Service: Orthopedics;  Laterality: Right;  WITH SYNOVECTOMY   LEFT CARPAL TUNNEL / LEFT MIDDLE & RING FINGER TRIGGER RELEASE  08-26-2008   LEFT SHOULDER ARTHROSCOPY W/ DEBRIDEMENT  09-09-2003   LEFT SHOULDER ARTHROSCOPY/ LEFT THUMB TRIGGER RELEASE  02-22-2005   PHOTOCOAGULATION WITH LASER Right 03/20/2018   Procedure: PHOTOCOAGULATION WITH LASER;  Surgeon: Carmela Rima, MD;  Location: [facility] OR;  Service: Ophthalmology;  Laterality: Right;   PULLEY RELEASE LEFT LONG FINGER  07-14-2009   RADIAL HEAD ARTHROPLASTY Right 06/17/2018   Procedure: RADIAL HEAD ARTHROPLASTY;  Surgeon: Bjorn Pippin, MD;  Location: [facility] OR;  Service: Orthopedics;  Laterality: Right;   RIGHT CARPAL TUNNEL/ RIGHT THUMB TRIGGER RELEASE'S  11-28-2006   RIGHT SHOULDER ARTHROSCOPY W/ ROTATOR CUFF REPAIR  01-13-2004   SHOULDER ARTHROSCOPY DISTAL CLAVICLE EXCISION AND OPEN ROTATOR CUFF REPAIR  09-07-2004   LEFT   SHOULDER ARTHROSCOPY W/ ACROMIAL REPAIR  11-29-2005  LEFT   SYNOVECTOMY Left 06/20/2022   Procedure: left index finger and left long finger metacarpal phalangel joint syonvectomy;  Surgeon: Dairl Ponder, MD;  Location: [facility] OR;  Service: Orthopedics;  Laterality: Left;  needs 1 hour of time..   TONSILLECTOMY AND ADENOIDECTOMY  child   TOTAL KNEE ARTHROPLASTY  12-14-2009   RIGHT   TOTAL KNEE ARTHROPLASTY Left 11/05/2012   Procedure: LEFT TOTAL KNEE ARTHROPLASTY;  Surgeon: Loanne Drilling, MD;  Location: [facility] ORS;  Service: Orthopedics;  Laterality: Left;   TOTAL KNEE REVISION Right 07/29/2016   Procedure: RIGHT KNEE POLY-LINER EXCHANGE;  Surgeon: Kathryne Hitch, MD;  Location: [facility];  Service: Orthopedics;  Laterality: Right;   TRIGGER FINGER RELEASE Right 02/21/2013   Procedure: RIGHT RING A-1 PULLEY RELEASE    (MINOR PROCEDURE) ;  Surgeon: Wyn Forster., MD;  Location: [facility];  Service: Orthopedics;  Laterality: Right;   TRIGGER FINGER RELEASE Left 09/22/2016    Procedure: RELEASE TRIGGER FINGER LEFT INDEX FINGER;  Surgeon: Kathryne Hitch, MD;  Location: [facility] OR;  Service: Orthopedics;  Laterality: Left;   TRIGGER FINGER RELEASE Right 01/20/2017   Procedure: RELEASE TRIGGER FINGER/A-1 PULLEY RIGHT INDEX FINGER;  Surgeon: Marcene Corning, MD;  Location: [facility];  Service: Orthopedics;  Laterality: Right;   VITRECTOMY 25 GAUGE WITH SCLERAL BUCKLE Right 03/20/2018   Procedure: RIGHT EYE VITRECTOMY WITH  ENDOLASER PARENTAL PHOTOCOAGULATION 25 GAUGE;  Surgeon: Carmela Rima, MD;  Location: [facility] OR;  Service: Ophthalmology;  Laterality: Right;   WRIST ARTHROSCOPY WITH DEBRIDEMENT Right 05/01/2019   Procedure: WRIST ARTHROSCOPY WITH DEBRIDEMENT;  Surgeon: Dairl Ponder, MD;  Location: [facility];  Service: Orthopedics;  Laterality: Right;   Patient Active Problem List   Diagnosis Date Noted   Notalgia 08/03/2022   BMI 45.0-49.9, adult (HCC) 06/13/2022   Other secondary hypertension 06/13/2022   SOBOE (shortness of breath on exertion) 05/30/2022   Depression screening 05/30/2022   Morbid obesity (HCC) 05/30/2022   Class 3 severe obesity with serious comorbidity and body mass index (BMI) of 50.0 to 59.9 in adult [facility]) 05/30/2022   Paroxysmal atrial fibrillation (HCC) 10/22/2021   Hypercoagulable state due to paroxysmal atrial fibrillation (HCC) 10/22/2021   Radial head fracture, closed 06/16/2018   Closed fracture dislocation of elbow 06/16/2018   Other fatigue 07/18/2017   Shortness of breath on exertion 07/18/2017   Type 2 diabetes mellitus with retinopathy, with long-term current use of insulin (HCC) 07/18/2017   Vitamin D deficiency 07/18/2017   B12 nutritional deficiency 07/18/2017   Other specified hypothyroidism 07/18/2017   Acute pain of right knee 12/22/2016   Trigger index finger of left hand 09/22/2016   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 requiring isolated polyethylene liner  exchange (HCC) 07/29/2016   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) 04/14/2016   Diabetes (HCC) 01/08/2014   Essential hypertension 01/08/2014   Hyperlipidemia 01/08/2014   Postoperative anemia due to acute blood loss 11/22/2012   Hyponatremia 11/06/2012   OA (osteoarthritis) of knee 11/05/2012   Villonodular synovitis of knee 04/20/2011    PCP: Thana Ates, MD  REFERRING PROVIDER: Rodolph Bong, MD   REFERRING DIAG: M54.42,M54.41,G89.29 (ICD-10-CM) - Chronic bilateral low back pain with bilateral sciatica   Rationale for Evaluation and Treatment: Rehabilitation  THERAPY DIAG:  Other low back pain  Muscle weakness (generalized)  Difficulty in walking, not elsewhere classified  ONSET DATE: "a couple of years", but started worsening in  May 2024  SUBJECTIVE:                                                                                                                                                                                           SUBJECTIVE STATEMENT:  "My back really hurt after last session until the next day 7/10"  PERTINENT HISTORY:  OA, A-Fib, DM, bilateral TKA, bilateral shoulder surgeries  PAIN:  12/01/22 Are you having pain? Yes: NPRS scale: 2/10 Pain location: low back Pain description: pain radiates from buttocks down bilateral LE past knees (Left more involved than Right) Aggravating factors: coughing, prolonged sitting and standing  Relieving factors: injection, sleep, weight shifting    PRECAUTIONS: None  WEIGHT BEARING RESTRICTIONS: No  FALLS:  Has patient fallen in last 6 months? No  LIVING ENVIRONMENT: Lives with: lives alone Lives in: House/apartment Stairs: No Has following equipment at home: Single point cane  OCCUPATION: Works in admissions in [employer]  PLOF: Independent and Leisure: crafting  PATIENT GOALS: To be able to walk without hunching over and less pain.  NEXT MD VISIT: as needed  OBJECTIVE:    DIAGNOSTIC FINDINGS:  Lumbar MRI on 08/28/2022: IMPRESSION: 1. Lumbar spine spondylosis as described above. 2. No acute osseous injury of the lumbar spine.  Lumbar Radiograph on 08/09/2022: IMPRESSION: Multilevel degenerative changes of the lumbar spine as discussed above.  PATIENT SURVEYS:  Eval:  FOTO 40 (projected 92 by visit 12) 11/11/22: FOTO 58 (answers based on patients subjective report)  COGNITION: Overall cognitive status: Within functional limits for tasks assessed     SENSATION: WFL  MUSCLE LENGTH: Hamstrings: Tightness bilaterally   POSTURE: rounded shoulders, forward head, increased lumbar lordosis, increased thoracic kyphosis, and posterior pelvic tilt   LUMBAR ROM:  Some decreased motion with pain  LOWER EXTREMITY ROM:   WFL  LOWER EXTREMITY MMT:   Eval:  Bilateral LE strength grossly 4/5 throughout with some pain noted  LUMBAR SPECIAL TESTS:  Slump test: Positive  FUNCTIONAL TESTS:  09/22/2022: 5 times sit to stand: 10.15 sec Timed up and go (TUG): 12.84 sec  11/10/22: 5 times sit to stand: 9.00 sec Timed up and go (TUG): 10.98 sec  GAIT: Distance walked: 200 ft Assistive device utilized: None Level of assistance: Complete Independence Comments: Pt with antalgic gait  TODAY'S TREATMENT:    12/15/22  Patient seen for aquatic therapy today.  Treatment took place in water 2.5-4 feet deep depending upon activity.  Pt entered the pool via stairs with bilat UE on rails independently. Pt requires buoyancy of water for support and to offload joints with strengthening exercises.     *  walking forward/ backward  * side stepping with arm addct with rainbow HB * TrA set with full hollow blue noodle pull down to thighs x 10ea in wide stance then staggered * staggered stance then wide with kick board row 2 sets x 10 ea position * L stretch with feet on wall, x 3 * holding hand floats: alternating single leg clams, leg swings into hip flex/ext x 10  each *hip hinge x 10 *STS from 3rd step x 8 gaining immediate standing balance indep.  - from 4th step x 5 * straddling noodle:  cycling with gentle breast stroke arms  9/25: Standing hamstring stretch 3 x 30 sec Standing quad/hip flexor stretch 3 x 30 sec Seated PPT x 20 Supine PPT 2x10 Supine hooklying alt LE march  x20, TA contraction Supine stability ball press 2x10 Lower Trunk Rotations x10 each way Seated ball press onto knees (purple ball) x 10   9/19: NuStep L6 x 6' PT present to discuss status Seated HS stretch bil 2x20" Supine PPT 2x10 Supine hooklying alt LE march  x20, TA contraction Supine stability ball press 2x10 Lower Trunk Rotations x10 each way Supine straight leg raise + TA contraction x10 each leg Clamshells yellow loop x10 each leg Standing calf stretch 2x20sec each Marches at barre x20 Sitting Rows at pulleys 10# 2x10 Sitting Shoulder extension at pulleys 10# 2x10   9/17: NuStep L6 x 6' PT present to discuss status Seated HS stretch bil 2x20" Supine PPT 10x5" Supine hooklying alt LE march yellow loop band x20, TA contraction Supine stability ball crunches x10 Supine legs on stability ball D2 flexion with blue ball x10 each Modified Dead bugs x20 Supine straight leg raise + TA contraction x10 each leg Clamshells yellow loop x10 each leg Marches at barre x20 Squats at [employer] Rows at pulleys 10# 2x10    PATIENT EDUCATION:  Education details: aquatic therapy modifications  Person educated: Patient Education method: Medical illustrator and Handouts Education comprehension: verbalized understanding  HOME EXERCISE PROGRAM: Access Code: [email or web address] URL: [email or web address] Date: 10/10/2022 Prepared by: Vernon Prey April Kirstie Peri  Exercises - Seated Hamstring Stretch  - 1 x daily - 7 x weekly - 2 reps - 20 hold - Standing March with Counter Support  - 1 x daily - 7 x weekly - 2 sets - 10 reps - Standing Hip  Abduction with Unilateral Counter Support  - 1 x daily - 7 x weekly - 2 sets - 10 reps - Standing Hip Extension with Unilateral Counter Support  - 1 x daily - 7 x weekly - 2 sets - 10 reps - Standing 'L' Stretch at Counter  - 1 x daily - 7 x weekly - 2 sets - 30 sec hold - Posterior Pelvic Tilt with Adduction Using Ball in Supine  - 1 x daily - 7 x weekly - 2 sets - 10 reps - Hooklying Clamshell with Resistance  - 1 x daily - 7 x weekly - 2 sets - 10 reps  Access Code: [email or web address] URL: [email or web address] Date: 10/25/2022 Prepared by: [facility] [address]  Patient Education - TENS Unit - TENS Therapy  Access Code: [email or web address] URL: [email or web address] Date: 12/14/2022 Prepared by: Geni Bers   Exercises - Noodle press  - 1 x daily - 1-2 x weekly - 1-3 sets - 10 reps - Side Stepping with Hand Floats  - Kick Board Row  - 1 x daily - 1-3 x weekly - 1-3 sets -  10 reps - Standing Hip Flexion Extension at [employer]  - 1 x daily - 1-3 x weekly - 1-3 sets - 10 reps - Sit to Stand  - 1 x daily - 1-3 x weekly - 1-3 sets - 10 reps - Standing Hip Hinge  - 1 x daily - 1-3 x weekly - 1-3 sets - 10 reps - Seated Straddle on Flotation Forward Breast Stroke Arms and Bicycle Legs  Not issued as medbridge down ASSESSMENT:  CLINICAL IMPRESSION: Report some increase in LB discomfort after last session.  Was unable to issue final aquatic HEP due to Medbridge being down.  She was directed through entire program given vc and minimal clarifications on program to ensure understanding.  Pain low.  She completes all exercises well/with good toleration.  STS from decreasing submersion very well tolerated. Plan to email HEP as per pt preference. She has reached her max potential in setting and is ready to complete aquatic HEP indep. Next session land based, appears to be the last visit scheduled.        OBJECTIVE IMPAIRMENTS: Abnormal gait, decreased  activity tolerance, decreased balance, decreased coordination, decreased endurance, decreased mobility, difficulty walking, decreased ROM, decreased strength, impaired flexibility, improper body mechanics, postural dysfunction, obesity, and pain.   ACTIVITY LIMITATIONS: carrying, lifting, bending, sitting, standing, squatting, and locomotion level  PARTICIPATION LIMITATIONS: cleaning, laundry, community activity, and occupation  PERSONAL FACTORS: Age, Fitness, Profession, and 3+ comorbidities: OA, Bilat TKE, DM, A-Fib  are also affecting patient's functional outcome.   REHAB POTENTIAL: Good  CLINICAL DECISION MAKING: Evolving/moderate complexity  EVALUATION COMPLEXITY: Moderate   GOALS: Goals reviewed with patient? Yes  SHORT TERM GOALS: Target date: 10/14/2022  Patient will demonstrate competence with basic HEP. Baseline: Goal status: MET  2.  Patient will perform a 6 MWT test to receive a baseline measurement to assess aerobic endurance and to set prolonged walking goals.  Baseline: patient shared they avoid prolonged walking due to pain  Goal status: IN PROGRESS  3.  Patient will demonstrate proper technique when lifting light weight (5-10#) objects off of the floor without increased pain.  Baseline: MOI was [place] suitcase at airport  Goal status: IN PROGRESS  4.  Patient will demonstrate proper log roll technique in and out bed to avoid increased pain. Baseline: transferring was difficult  Goal status: IN PROGRESS   LONG TERM GOALS: Target date: 11/18/2022  Patient will perform advanced HEP independently upon discharge.  Baseline:  Goal status: In Progress (complete with aquatics) 12/19/22  2.  Patient will decrease TUG time to no greater than 10 sec in order to decrease the risk of future falls.  Baseline: 12.84 sec Goal status: Progressing ; patient was just over 10 seconds today  3.  Patient will improve their FOTO score to 56% in order to participate in ADLs and  IADLs without increased pain.  Baseline: 46% Goal status: MET 58%  4.  Patient will be able to walk for 20 minutes without increased pain for community activity, such as grocery shopping.  Baseline: patient shared that they have trouble walking to and from the bathroom at work  Goal status: In progress  5.  Patient will increase global LE strength to at least 4+ to 5-/5 required for getting on and off chair at work.  Baseline: 4/5, patient reported fear of falling  Goal status: In progress   PLAN:  PT FREQUENCY: 1-2x/week  PT DURATION: 8 weeks  PLANNED INTERVENTIONS: Therapeutic exercises, Therapeutic activity, Neuromuscular  re-education, Balance training, Gait training, Patient/Family education, Self Care, Joint mobilization, Stair training, Aquatic Therapy, Dry Needling, Electrical stimulation, Spinal manipulation, Spinal mobilization, Cryotherapy, Moist heat, Taping, Traction, Ultrasound, Ionotophoresis 4mg /ml Dexamethasone, Manual therapy, and Re-evaluation.  PLAN FOR NEXT SESSION: Progress core work and continue flexibility exercises.  Seated weighted ball core work would be a good option for [employer].     Corrie Dandy Tomma Lightning) Aleiyah Halpin MPT 12/19/22 5:40 PM

## 2022-12-21 DIAGNOSIS — E113511 Type 2 diabetes mellitus with proliferative diabetic retinopathy with macular edema, right eye: Secondary | ICD-10-CM | POA: Diagnosis not present

## 2022-12-23 ENCOUNTER — Other Ambulatory Visit (HOSPITAL_COMMUNITY): Payer: Self-pay

## 2022-12-23 MED ORDER — ZOSTER VAC RECOMB ADJUVANTED 50 MCG/0.5ML IM SUSR
0.5000 mL | Freq: Once | INTRAMUSCULAR | 1 refills | Status: AC
  Filled 2022-12-23: qty 0.5, 1d supply, fill #0
  Filled 2023-04-12 (×2): qty 0.5, 1d supply, fill #1

## 2022-12-27 ENCOUNTER — Other Ambulatory Visit: Payer: Self-pay | Admitting: Internal Medicine

## 2022-12-27 DIAGNOSIS — Z1231 Encounter for screening mammogram for malignant neoplasm of breast: Secondary | ICD-10-CM

## 2022-12-28 ENCOUNTER — Ambulatory Visit (INDEPENDENT_AMBULATORY_CARE_PROVIDER_SITE_OTHER): Payer: 59 | Admitting: Family Medicine

## 2022-12-28 ENCOUNTER — Encounter: Payer: Self-pay | Admitting: Physical Therapy

## 2022-12-28 ENCOUNTER — Encounter (INDEPENDENT_AMBULATORY_CARE_PROVIDER_SITE_OTHER): Payer: Self-pay | Admitting: Family Medicine

## 2022-12-28 ENCOUNTER — Ambulatory Visit: Payer: 59 | Attending: Family Medicine | Admitting: Physical Therapy

## 2022-12-28 ENCOUNTER — Other Ambulatory Visit (HOSPITAL_COMMUNITY): Payer: Self-pay

## 2022-12-28 VITALS — BP 126/68 | HR 67 | Temp 98.3°F | Ht 60.0 in | Wt 249.0 lb

## 2022-12-28 DIAGNOSIS — M5459 Other low back pain: Secondary | ICD-10-CM | POA: Insufficient documentation

## 2022-12-28 DIAGNOSIS — R262 Difficulty in walking, not elsewhere classified: Secondary | ICD-10-CM | POA: Diagnosis not present

## 2022-12-28 DIAGNOSIS — I10 Essential (primary) hypertension: Secondary | ICD-10-CM | POA: Diagnosis not present

## 2022-12-28 DIAGNOSIS — Z6841 Body Mass Index (BMI) 40.0 and over, adult: Secondary | ICD-10-CM

## 2022-12-28 DIAGNOSIS — E669 Obesity, unspecified: Secondary | ICD-10-CM

## 2022-12-28 DIAGNOSIS — M6281 Muscle weakness (generalized): Secondary | ICD-10-CM | POA: Diagnosis not present

## 2022-12-28 NOTE — Therapy (Signed)
OUTPATIENT PHYSICAL THERAPY THORACOLUMBAR TREATMENT / DISCHARGE NOTE   Patient Name: [name] MRN: [id number] DOB:03-13-1951, 72 y.o., female Today's Date: 12/28/2022  END OF SESSION:  PT End of Session - 12/28/22 1720     Visit Number 18    Date for PT Re-Evaluation 12/22/22    Authorization Type [employer]    Progress Note Due on Visit 19    PT Start Time 1544   Patient was late to appointment   PT Stop Time 1618    PT Time Calculation (min) 34 min    Activity Tolerance Patient tolerated treatment well    Behavior During Therapy WFL for tasks assessed/performed                  Past Medical History:  Diagnosis Date   A-fib (HCC)    Anemia    hx of   Arthritis    Chest pain    Constipation    Depression    Depression    Diabetes mellitus ORAL MEDS   Diabetic retinopathy (HCC)    Diabetic retinopathy (HCC)    Dyspnea    with minimal exertion-deconditioned   Dyspnea    Dysrhythmia    a-fib   Food allergy    GERD (gastroesophageal reflux disease)    occasional   Gout    Heartburn    History of kidney stones    Hypercholesteremia    Hyperlipidemia    Hypertension    Hypertensive kidney disease    Hypothyroidism    Joint pain    Kidney problem    Knee pain, right    Left arm numbness DUE TO CERVICAL PINCHED NERVE   Obesity    OSA on CPAP    uses CPAP nightly   Osteoarthritis    Osteoarthritis    Palpitations    Pinched nerve in neck    Pleurisy    Pneumonia    hx of   PONV (postoperative nausea and vomiting)    for 3-4 days after general anesthesia approx 10-12 years ago   Sciatica    Shortness of breath    Sleep apnea    Spinal stenosis    Swelling of both lower extremities    Swelling of knee joint, right    Synovitis of knee RIGHT   Trigger finger, left    left index   Vitamin D deficiency    Past Surgical History:  Procedure Laterality Date   CESAREAN SECTION  X3   KNEE ARTHROSCOPY  04/20/2011   Procedure: ARTHROSCOPY  KNEE;  Surgeon: Loanne Drilling, MD;  Location: [facility];  Service: Orthopedics;  Laterality: Right;  WITH SYNOVECTOMY   LEFT CARPAL TUNNEL / LEFT MIDDLE & RING FINGER TRIGGER RELEASE  08-26-2008   LEFT SHOULDER ARTHROSCOPY W/ DEBRIDEMENT  09-09-2003   LEFT SHOULDER ARTHROSCOPY/ LEFT THUMB TRIGGER RELEASE  02-22-2005   PHOTOCOAGULATION WITH LASER Right 03/20/2018   Procedure: PHOTOCOAGULATION WITH LASER;  Surgeon: Carmela Rima, MD;  Location: [facility] OR;  Service: Ophthalmology;  Laterality: Right;   PULLEY RELEASE LEFT LONG FINGER  07-14-2009   RADIAL HEAD ARTHROPLASTY Right 06/17/2018   Procedure: RADIAL HEAD ARTHROPLASTY;  Surgeon: Bjorn Pippin, MD;  Location: [facility] OR;  Service: Orthopedics;  Laterality: Right;   RIGHT CARPAL TUNNEL/ RIGHT THUMB TRIGGER RELEASE'S  11-28-2006   RIGHT SHOULDER ARTHROSCOPY W/ ROTATOR CUFF REPAIR  01-13-2004   SHOULDER ARTHROSCOPY DISTAL CLAVICLE EXCISION AND OPEN ROTATOR CUFF REPAIR  09-07-2004   LEFT  SHOULDER ARTHROSCOPY W/ ACROMIAL REPAIR  11-29-2005   LEFT   SYNOVECTOMY Left 06/20/2022   Procedure: left index finger and left long finger metacarpal phalangel joint syonvectomy;  Surgeon: Dairl Ponder, MD;  Location: [facility] OR;  Service: Orthopedics;  Laterality: Left;  needs 1 hour of time..   TONSILLECTOMY AND ADENOIDECTOMY  child   TOTAL KNEE ARTHROPLASTY  12-14-2009   RIGHT   TOTAL KNEE ARTHROPLASTY Left 11/05/2012   Procedure: LEFT TOTAL KNEE ARTHROPLASTY;  Surgeon: Loanne Drilling, MD;  Location: [facility];  Service: Orthopedics;  Laterality: Left;   TOTAL KNEE REVISION Right 07/29/2016   Procedure: RIGHT KNEE POLY-LINER EXCHANGE;  Surgeon: Kathryne Hitch, MD;  Location: [facility];  Service: Orthopedics;  Laterality: Right;   TRIGGER FINGER RELEASE Right 02/21/2013   Procedure: RIGHT RING A-1 PULLEY RELEASE    (MINOR PROCEDURE) ;  Surgeon: Wyn Forster., MD;  Location[facility];  Service: Orthopedics;  Laterality:  Right;   TRIGGER FINGER RELEASE Left 09/22/2016   Procedure: RELEASE TRIGGER FINGER LEFT INDEX FINGER;  Surgeon: Kathryne Hitch, MD;  Location: [facility] OR;  Service: Orthopedics;  Laterality: Left;   TRIGGER FINGER RELEASE Right 01/20/2017   Procedure: RELEASE TRIGGER FINGER/A-1 PULLEY RIGHT INDEX FINGER;  Surgeon: Marcene Corning, MD;  Location: [facility];  Service: Orthopedics;  Laterality: Right;   VITRECTOMY 25 GAUGE WITH SCLERAL BUCKLE Right 03/20/2018   Procedure: RIGHT EYE VITRECTOMY WITH  ENDOLASER PARENTAL PHOTOCOAGULATION 25 GAUGE;  Surgeon: Carmela Rima, MD;  Location: [facility] OR;  Service: Ophthalmology;  Laterality: Right;   WRIST ARTHROSCOPY WITH DEBRIDEMENT Right 05/01/2019   Procedure: WRIST ARTHROSCOPY WITH DEBRIDEMENT;  Surgeon: Dairl Ponder, MD;  Location: [facility];  Service: Orthopedics;  Laterality: Right;   Patient Active Problem List   Diagnosis Date Noted   Notalgia 08/03/2022   BMI 45.0-49.9, adult (HCC) 06/13/2022   Other secondary hypertension 06/13/2022   SOBOE (shortness of breath on exertion) 05/30/2022   Depression screening 05/30/2022   Morbid obesity (HCC) 05/30/2022   Class 3 severe obesity with serious comorbidity and body mass index (BMI) of 50.0 to 59.9 in adult [facility]) 05/30/2022   Paroxysmal atrial fibrillation (HCC) 10/22/2021   Hypercoagulable state due to paroxysmal atrial fibrillation (HCC) 10/22/2021   Radial head fracture, closed 06/16/2018   Closed fracture dislocation of elbow 06/16/2018   Other fatigue 07/18/2017   Shortness of breath on exertion 07/18/2017   Type 2 diabetes mellitus with retinopathy, with long-term current use of insulin (HCC) 07/18/2017   Vitamin D deficiency 07/18/2017   B12 nutritional deficiency 07/18/2017   Other specified hypothyroidism 07/18/2017   Acute pain of right knee 12/22/2016   Trigger index finger of left hand 09/22/2016   Polyethylene liner wear following total knee  arthroplasty requiring isolated polyethylene liner exchange (HCC) 07/29/2016   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) 04/14/2016   Diabetes (HCC) 01/08/2014   Essential hypertension 01/08/2014   Hyperlipidemia 01/08/2014   Postoperative anemia due to acute blood loss 11/22/2012   Hyponatremia 11/06/2012   OA (osteoarthritis) of knee 11/05/2012   Villonodular synovitis of knee 04/20/2011    PCP: Thana Ates, MD  REFERRING PROVIDER: Rodolph Bong, MD   REFERRING DIAG: M54.42,M54.41,G89.29 (ICD-10-CM) - Chronic bilateral low back pain with bilateral sciatica   Rationale for Evaluation and Treatment: Rehabilitation  THERAPY DIAG:  Other low back pain  Muscle weakness (generalized)  Difficulty in walking, not elsewhere classified  ONSET  DATE: "a couple of years", but started worsening in May 2024  SUBJECTIVE:                                                                                                                                                                                           SUBJECTIVE STATEMENT:  Patient reports her back is really painful today. 8/10 pain. When patient goes to the grocery store her back is painful by the time she gets home.  PERTINENT HISTORY:  OA, A-Fib, DM, bilateral TKA, bilateral shoulder surgeries  PAIN:  12/01/22 Are you having pain? Yes: NPRS scale: 2/10 Pain location: low back Pain description: pain radiates from buttocks down bilateral LE past knees (Left more involved than Right) Aggravating factors: coughing, prolonged sitting and standing  Relieving factors: injection, sleep, weight shifting    PRECAUTIONS: None  WEIGHT BEARING RESTRICTIONS: No  FALLS:  Has patient fallen in last 6 months? No  LIVING ENVIRONMENT: Lives with: lives alone Lives in: House/apartment Stairs: No Has following equipment at home: Single point cane  OCCUPATION: Works in admissions in [employer]  PLOF: Independent and  Leisure: crafting  PATIENT GOALS: To be able to walk without hunching over and less pain.  NEXT MD VISIT: as needed  OBJECTIVE:   DIAGNOSTIC FINDINGS:  Lumbar MRI on 08/28/2022: IMPRESSION: 1. Lumbar spine spondylosis as described above. 2. No acute osseous injury of the lumbar spine.  Lumbar Radiograph on 08/09/2022: IMPRESSION: Multilevel degenerative changes of the lumbar spine as discussed above.  PATIENT SURVEYS:  Eval:  FOTO 40 (projected 65 by visit 12) 11/11/22: FOTO 58 (answers based on patients subjective report)  COGNITION: Overall cognitive status: Within functional limits for tasks assessed     SENSATION: WFL  MUSCLE LENGTH: Hamstrings: Tightness bilaterally   POSTURE: rounded shoulders, forward head, increased lumbar lordosis, increased thoracic kyphosis, and posterior pelvic tilt   LUMBAR ROM:  Some decreased motion with pain  LOWER EXTREMITY ROM:   WFL  LOWER EXTREMITY MMT:   Eval:  Bilateral LE strength grossly 4/5 throughout with some pain noted  LUMBAR SPECIAL TESTS:  Slump test: Positive  FUNCTIONAL TESTS:  09/22/2022: 5 times sit to stand: 10.15 sec Timed up and go (TUG): 12.84 sec  11/10/22: 5 times sit to stand: 9.00 sec Timed up and go (TUG): 10.98 sec  12/28/2022 5 times sit to stand:14.54 sec Timed up and go (TUG):13.99 sec  GAIT: Distance walked: 200 ft Assistive device utilized: None Level of assistance: Complete Independence Comments: Pt with antalgic gait  TODAY'S TREATMENT:    12/28/22: NuStep Level 2 5 mins - PT present to discuss status 3  way SB ball stretch x 8 each way Supine LTR x 10 Supine Hamstring stretch 2 x 30 sec Reassessment for discharge   12/15/22  Patient seen for aquatic therapy today.  Treatment took place in water 2.5-4 feet deep depending upon activity.  Pt entered the pool via stairs with bilat UE on rails independently. Pt requires buoyancy of water for support and to offload joints with  strengthening exercises.     * walking forward/ backward  * side stepping with arm addct with rainbow HB * TrA set with full hollow blue noodle pull down to thighs x 10ea in wide stance then staggered * staggered stance then wide with kick board row 2 sets x 10 ea position * L stretch with feet on wall, x 3 * holding hand floats: alternating single leg clams, leg swings into hip flex/ext x 10 each *hip hinge x 10 *STS from 3rd step x 8 gaining immediate standing balance indep.  - from 4th step x 5 * straddling noodle:  cycling with gentle breast stroke arms  9/25: Standing hamstring stretch 3 x 30 sec Standing quad/hip flexor stretch 3 x 30 sec Seated PPT x 20 Supine PPT 2x10 Supine hooklying alt LE march  x20, TA contraction Supine stability ball press 2x10 Lower Trunk Rotations x10 each way Seated ball press onto knees (purple ball) x 10    PATIENT EDUCATION:  Education details: aquatic therapy modifications  Person educated: Patient Education method: Medical illustrator and Handouts Education comprehension: verbalized understanding  HOME EXERCISE PROGRAM: Access Code: [email or web address] URL: [email or web address] Date: 12/28/2022 Prepared by: Claude Manges  Exercises - Seated Hamstring Stretch  - 1 x daily - 7 x weekly - 2 reps - 20 hold - Standing March with Counter Support  - 1 x daily - 7 x weekly - 2 sets - 10 reps - Standing Hip Abduction with Unilateral Counter Support  - 1 x daily - 7 x weekly - 2 sets - 10 reps - Standing Hip Extension with Unilateral Counter Support  - 1 x daily - 7 x weekly - 2 sets - 10 reps - Standing 'L' Stretch at Counter  - 1 x daily - 7 x weekly - 2 sets - 30 sec hold - Posterior Pelvic Tilt with Adduction Using Ball in Supine  - 1 x daily - 7 x weekly - 2 sets - 10 reps - Hooklying Clamshell with Resistance  - 1 x daily - 7 x weekly - 2 sets - 10 reps - Supine Lower Trunk Rotation  - 1 x daily - 7 x weekly - 2 sets - 10  reps - Supine Hamstring Stretch with Strap  - 1 x daily - 7 x weekly - 3 sets - 10 reps  Access Code: [email or web address] URL: [email or web address] Date: 10/25/2022 Prepared by: [facility] - [address]  Patient Education - TENS Unit - TENS Therapy  Access Code: [email or web address] URL: [email or web address] Date: 12/14/2022 Prepared by: Geni Bers   Exercises - Noodle press  - 1 x daily - 1-2 x weekly - 1-3 sets - 10 reps - Side Stepping with Hand Floats  - Kick Board Row  - 1 x daily - 1-3 x weekly - 1-3 sets - 10 reps - Standing Hip Flexion Extension at [employer]  - 1 x daily - 1-3 x weekly - 1-3 sets - 10 reps - Sit to Stand  - 1 x daily - 1-3 x weekly - 1-3 sets - 10 reps -  Standing Hip Hinge  - 1 x daily - 1-3 x weekly - 1-3 sets - 10 reps - Seated Straddle on Flotation Forward Breast Stroke Arms and Bicycle Legs  Not issued as medbridge down ASSESSMENT:  CLINICAL IMPRESSION: [place] has not been responding well to therapeutic techniques and aquatic therapy. She still experiences high pain numbers 6-8/10 which interfere with her daily activities and completing her job duties. She is unable to grocery shop and walk for longer than 10 minutes without experiencing increased back pain. Educated patient to make a follow up appointment with her doctor to see if she is a candidate for another shot. Previously she responded well and found relief. Updated patient's HEP to include stretches that she can do when she is experiencing increased pain. Patient was in agreement that she will schedule an appointment with her doctor to discuss other options. Will d/c patient at this time.      OBJECTIVE IMPAIRMENTS: Abnormal gait, decreased activity tolerance, decreased balance, decreased coordination, decreased endurance, decreased mobility, difficulty walking, decreased ROM, decreased strength, impaired flexibility, improper body mechanics, postural dysfunction,  obesity, and pain.   ACTIVITY LIMITATIONS: carrying, lifting, bending, sitting, standing, squatting, and locomotion level  PARTICIPATION LIMITATIONS: cleaning, laundry, community activity, and occupation  PERSONAL FACTORS: Age, Fitness, Profession, and 3+ comorbidities: OA, Bilat TKE, DM, A-Fib  are also affecting patient's functional outcome.   REHAB POTENTIAL: Good  CLINICAL DECISION MAKING: Evolving/moderate complexity  EVALUATION COMPLEXITY: Moderate   GOALS: Goals reviewed with patient? Yes  SHORT TERM GOALS: Target date: 10/14/2022  Patient will demonstrate competence with basic HEP. Baseline: Goal status: MET  2.  Patient will perform a 6 MWT test to receive a baseline measurement to assess aerobic endurance and to set prolonged walking goals.  Baseline: patient shared they avoid prolonged walking due to pain  Goal status: NOT MET- not administered due to patient's pain  3.  Patient will demonstrate proper technique when lifting light weight (5-10#) objects off of the floor without increased pain.  Baseline: MOI was [place] suitcase at airport  Goal status: NOT MET- Patient experiences pain when lifting objects form floor  4.  Patient will demonstrate proper log roll technique in and out bed to avoid increased pain. Baseline: transferring was difficult  Goal status: GOAL MET 10-16/2024   LONG TERM GOALS: Target date: 11/18/2022  Patient will perform advanced HEP independently upon discharge.  Baseline:  Goal status: MET 12/28/2022  2.  Patient will decrease TUG time to no greater than 10 sec in order to decrease the risk of future falls.  Baseline: 12.84 sec Goal status: NOT MET- TUG: 13.99sec  3.  Patient will improve their FOTO score to 56% in order to participate in ADLs and IADLs without increased pain.  Baseline: 46% Goal status: MET 58%  4.  Patient will be able to walk for 20 minutes without increased pain for community activity, such as grocery  shopping.  Baseline: patient shared that they have trouble walking to and from the bathroom at work  Goal status: NOT MET- patient can walk for 10 minutes for having pain  5.  Patient will increase global LE strength to at least 4+ to 5-/5 required for getting on and off chair at work.  Baseline: 4/5, patient reported fear of falling  Goal status: NOT MET patient still struggles getting off chair at work   PLAN:  PT FREQUENCY: 1-2x/week  PT DURATION: 8 weeks  PLANNED INTERVENTIONS: Therapeutic exercises, Therapeutic activity, Neuromuscular re-education,  Balance training, Gait training, Patient/Family education, Self Care, Joint mobilization, Stair training, Aquatic Therapy, Dry Needling, Electrical stimulation, Spinal manipulation, Spinal mobilization, Cryotherapy, Moist heat, Taping, Traction, Ultrasound, Ionotophoresis 4mg /ml Dexamethasone, Manual therapy, and Re-evaluation.  PLAN FOR NEXT SESSION: Progress core work and continue flexibility exercises.  Seated weighted ball core work would be a good option for [employer].    PHYSICAL THERAPY DISCHARGE SUMMARY  Visits from Start of Care: 17  Current functional level related to goals / functional outcomes: See above   Remaining deficits: She still experiences high pain numbers 6-8/10 which interfere with her daily activities and completing her job duties. She is unable to grocery shop and walk for longer than 10 minutes without experiencing increased back pain.    Education / Equipment: Updated patient's HEP see above   Patient agrees to discharge. Patient goals were partially met. Patient is being discharged due to lack of progress. Patient will schedule follow up with referring physician.    Claude Manges, PT 12/28/22 5:22 PM

## 2022-12-28 NOTE — Progress Notes (Signed)
Chief Complaint:   OBESITY [name] is here to discuss her progress with her obesity treatment plan along with follow-up of her obesity related diagnoses. [name] is on the Category 2 Plan and states she is following her eating plan approximately 60% of the time. [name] states she is walking for 10 minutes 3 times per week.  Today's visit was #: 8 Starting weight: 258 lbs Starting date: 05/30/2022 Today's weight: 249 lbs Today's date: 12/28/2022 Total lbs lost to date: 9 Total lbs lost since last in-office visit: 0  Interim History: Patient has done well with maintaining her weight since her last visit. She is working on meal planning but she sometimes struggles to eat all of the food on her plan.   Subjective:   1. Essential hypertension Patient's blood pressure is well controlled with her diet and weight loss, as well as her medications. She denies cheat pain or feeling lightheaded.   Assessment/Plan:   1. Essential hypertension Patient will continue to work on her weight loss and will continue to monitor for hypotension.   2. BMI 45.0-49.9, adult (HCC)  3. Obesity, Beginning BMI 50.39 [name] is currently in the action stage of change. As such, her goal is to continue with weight loss efforts. She has agreed to the Category 2 Plan.   Exercise goals: As is.   Behavioral modification strategies: meal planning and cooking strategies.  [name] has agreed to follow-up with our clinic in 4 weeks. She was informed of the importance of frequent follow-up visits to maximize her success with intensive lifestyle modifications for her multiple health conditions.   Objective:   Blood pressure 126/68, pulse 67, temperature 98.3 F (36.8 C), height 5' (1.524 m), weight 249 lb (112.9 kg), SpO2 92%. Body mass index is 48.63 kg/m.  Lab Results  Component Value Date   CREATININE 1.27 (H) 05/30/2022   BUN 34 (H) 05/30/2022   NA 139 05/30/2022   K 4.9 05/30/2022   CL 103 05/30/2022    CO2 17 (L) 05/30/2022   Lab Results  Component Value Date   ALT 13 05/30/2022   AST 19 05/30/2022   ALKPHOS 87 05/30/2022   BILITOT <0.2 05/30/2022   Lab Results  Component Value Date   HGBA1C 7.7 (H) 05/30/2022   HGBA1C 7.4 (H) 11/28/2017   HGBA1C 6.8 (H) 07/18/2017   HGBA1C 7.7 (H) 07/20/2016   HGBA1C 7.9 09/18/2014   Lab Results  Component Value Date   INSULIN 24.3 05/30/2022   Lab Results  Component Value Date   TSH 0.790 05/30/2022   Lab Results  Component Value Date   CHOL 111 05/30/2022   HDL 35 (L) 05/30/2022   LDLCALC 55 05/30/2022   TRIG 114 05/30/2022   Lab Results  Component Value Date   VD25OH 34.8 05/30/2022   VD25OH 31.4 11/28/2017   VD25OH 30.5 07/18/2017   Lab Results  Component Value Date   WBC 10.0 05/30/2022   HGB 12.4 05/30/2022   HCT 40.0 05/30/2022   MCV 88 05/30/2022   PLT 378 05/30/2022   No results found for: "IRON", "TIBC", "FERRITIN"  Attestation Statements:   Reviewed by clinician on day of visit: allergies, medications, problem list, medical history, surgical history, family history, social history, and previous encounter notes.  Time spent on visit including pre-visit chart review and post-visit care and charting was 25 minutes.   Trude Mcburney, am acting as Energy manager for Quillian Quince, MD.  I have reviewed the above documentation for  accuracy and completeness, and I agree with the above. -  Quillian Quince, MD

## 2022-12-29 DIAGNOSIS — M25642 Stiffness of left hand, not elsewhere classified: Secondary | ICD-10-CM | POA: Diagnosis not present

## 2022-12-29 DIAGNOSIS — S63659A Sprain of metacarpophalangeal joint of unspecified finger, initial encounter: Secondary | ICD-10-CM | POA: Diagnosis not present

## 2022-12-29 DIAGNOSIS — M79642 Pain in left hand: Secondary | ICD-10-CM | POA: Diagnosis not present

## 2023-01-02 ENCOUNTER — Ambulatory Visit (INDEPENDENT_AMBULATORY_CARE_PROVIDER_SITE_OTHER): Payer: 59 | Admitting: Family Medicine

## 2023-01-02 ENCOUNTER — Encounter: Payer: Self-pay | Admitting: Family Medicine

## 2023-01-02 VITALS — BP 122/78 | HR 63 | Ht 60.0 in | Wt 250.0 lb

## 2023-01-02 DIAGNOSIS — M5442 Lumbago with sciatica, left side: Secondary | ICD-10-CM

## 2023-01-02 DIAGNOSIS — M5441 Lumbago with sciatica, right side: Secondary | ICD-10-CM | POA: Diagnosis not present

## 2023-01-02 DIAGNOSIS — M47816 Spondylosis without myelopathy or radiculopathy, lumbar region: Secondary | ICD-10-CM | POA: Diagnosis not present

## 2023-01-02 DIAGNOSIS — G8929 Other chronic pain: Secondary | ICD-10-CM | POA: Diagnosis not present

## 2023-01-02 NOTE — Progress Notes (Signed)
I, [name], CMA acting as a scribe for Stefanie Graham, MD.  [name] is a 72 y.o. female who presents to [facility] at [facility] today for cont'd LBP. Pt was last seen by Dr. Denyse Amass on 09/05/22 and lumbar ESI was ordered and later performed on July 1st.  Today, pt reports 3-4 weeks of relief s/p ESI, then sx "came back with a vengeance". Pt locates pain to bilateral lower back radiating into the gluteal region and hips. Denies groin pain. Sharp shooting pain bilaterally.   Radiating pain: B LE LE numbness/tingling: intermittently in the posterior thigh LE weakness: no Aggravates: in and out of tall chair daily, ambulating distance with assistance, sit-to-stand.  Treatments tried: PT, ESI, HEP, IBU, ice  Dx imaging: 08/28/22 L-spine MRI             08/09/22 L-spine XR  Pertinent review of systems: No fevers or chills  Relevant historical information: Hypertension obesity diabetes.   Exam:  BP 122/78   Pulse 63   Ht 5' (1.524 m)   Wt 250 lb (113.4 kg)   SpO2 96%   BMI 48.82 kg/m  General: Well Developed, well nourished, and in no acute distress.   MSK: L-spine decreased lumbar motion some pain with full extension.    Lab and Radiology Results  EXAM: MRI LUMBAR SPINE WITHOUT CONTRAST   TECHNIQUE: Multiplanar, multisequence MR imaging of the lumbar spine was performed. No intravenous contrast was administered.   COMPARISON:  02/15/2018   FINDINGS: Segmentation:  Standard.   Alignment: Grade 1 anterolisthesis of L4 on L5. Minimal retrolisthesis of L3 on L4. Minimal grade 1 anterolisthesis of L5 on S1.   Vertebrae: No acute fracture, evidence of discitis, or aggressive bone lesion.   Conus medullaris and cauda equina: Conus extends to the L1 level. Conus and cauda equina appear normal.   Paraspinal and other soft tissues: No acute paraspinal abnormality. Suggestion of a fibroid uterus.   Disc levels:   Disc spaces: Degenerative  disease with disc height loss at L1-2 and L5-S1. Disc desiccation at L3-4 and L4-5.   T12-L1: No significant disc bulge. No neural foraminal stenosis. No central canal stenosis.   L1-L2: Broad-based disc bulge. Mild bilateral facet arthropathy. No foraminal or central canal stenosis.   L2-L3: No significant disc bulge. Mild bilateral facet arthropathy. No foraminal or central canal stenosis.   L3-L4: Broad-based disc bulge. Mild bilateral facet arthropathy. No foraminal or central canal stenosis.   L4-L5: Broad-based disc bulge. Severe bilateral facet arthropathy with ligamentum flavum infolding. Severe spinal stenosis. Moderate bilateral foraminal stenosis.   L5-S1: Broad-based disc bulge. Severe bilateral facet arthropathy. Moderate bilateral foraminal stenosis. No spinal stenosis. Bilateral subarticular stenosis.   IMPRESSION: 1. Lumbar spine spondylosis as described above. 2. No acute osseous injury of the lumbar spine.     Electronically Signed   By: Elige Ko [name]   On: 09/05/2022 09:07 I, [name], personally (independently) visualized and performed the interpretation of the images attached in this note.    Assessment and Plan: 72 y.o. female with chronic bilateral right worse than left low back pain with occasional pain radiating down her legs.  The majority of the pain is located in the low back bilaterally.  She did have a left interlaminar injection at L5 in July that provided pain relief for the back and leg pain for a few weeks.  She has had subsequent physical therapy including dry needling and aquatic physical therapy  that has not improved her pain considerably.  She still is experiencing daily pain worse with activity.  After discussion of current symptoms and review of her imaging we will refer to pain management to consider facet injections or medial branch block and ablation.  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  All 4 of these joints  could be potential targets.  Anticipate that she is get any more than a few injections or procedures so we will refer directly to pain management to discuss her options.  Radiology remains a potential backup plan if she has trouble getting in.   PDMP not reviewed this encounter. Orders Placed This Encounter  Procedures   Ambulatory referral to Pain Clinic    Referral Priority:   Routine    Referral Type:   Consultation    Referral Reason:   Specialty Services Required    Referred to Provider:   Renaldo Fiddler, MD    Requested Specialty:   Pain Medicine    Number of Visits Requested:   1   No orders of the defined types were placed in this encounter.    Discussed warning signs or symptoms. Please see discharge instructions. Patient expresses understanding.   The above documentation has been reviewed and is accurate and complete [name], [name] Total encounter time 30 minutes including face-to-face time with the patient and, reviewing past medical record, and charting on the date of service.

## 2023-01-03 DIAGNOSIS — S63659A Sprain of metacarpophalangeal joint of unspecified finger, initial encounter: Secondary | ICD-10-CM | POA: Diagnosis not present

## 2023-01-03 DIAGNOSIS — M79642 Pain in left hand: Secondary | ICD-10-CM | POA: Diagnosis not present

## 2023-01-03 DIAGNOSIS — M25642 Stiffness of left hand, not elsewhere classified: Secondary | ICD-10-CM | POA: Diagnosis not present

## 2023-01-04 DIAGNOSIS — E113512 Type 2 diabetes mellitus with proliferative diabetic retinopathy with macular edema, left eye: Secondary | ICD-10-CM | POA: Diagnosis not present

## 2023-01-06 ENCOUNTER — Other Ambulatory Visit (HOSPITAL_COMMUNITY): Payer: Self-pay

## 2023-01-09 ENCOUNTER — Other Ambulatory Visit (HOSPITAL_COMMUNITY): Payer: Self-pay

## 2023-01-09 ENCOUNTER — Other Ambulatory Visit: Payer: Self-pay

## 2023-01-09 MED ORDER — TRULICITY 4.5 MG/0.5ML ~~LOC~~ SOAJ
SUBCUTANEOUS | 11 refills | Status: DC
  Filled 2023-01-09: qty 2, 28d supply, fill #0
  Filled 2023-02-08: qty 2, 28d supply, fill #1
  Filled 2023-03-09: qty 2, 28d supply, fill #2
  Filled 2023-04-07: qty 2, 28d supply, fill #3
  Filled 2023-05-15: qty 2, 28d supply, fill #4
  Filled 2023-06-09: qty 2, 28d supply, fill #5
  Filled 2023-07-30: qty 2, 28d supply, fill #6
  Filled 2023-08-28: qty 2, 28d supply, fill #7
  Filled 2023-09-25: qty 2, 28d supply, fill #8
  Filled 2023-12-05: qty 2, 28d supply, fill #9

## 2023-01-09 MED ORDER — INDOMETHACIN 50 MG PO CAPS
ORAL_CAPSULE | ORAL | 0 refills | Status: DC
  Filled 2023-01-09: qty 20, 7d supply, fill #0

## 2023-01-10 ENCOUNTER — Other Ambulatory Visit (HOSPITAL_COMMUNITY): Payer: Self-pay

## 2023-01-11 DIAGNOSIS — M79642 Pain in left hand: Secondary | ICD-10-CM | POA: Diagnosis not present

## 2023-01-11 DIAGNOSIS — M25531 Pain in right wrist: Secondary | ICD-10-CM | POA: Diagnosis not present

## 2023-01-11 DIAGNOSIS — M24131 Other articular cartilage disorders, right wrist: Secondary | ICD-10-CM | POA: Diagnosis not present

## 2023-01-11 DIAGNOSIS — M25642 Stiffness of left hand, not elsewhere classified: Secondary | ICD-10-CM | POA: Diagnosis not present

## 2023-01-11 DIAGNOSIS — S63659A Sprain of metacarpophalangeal joint of unspecified finger, initial encounter: Secondary | ICD-10-CM | POA: Diagnosis not present

## 2023-01-11 DIAGNOSIS — G5621 Lesion of ulnar nerve, right upper limb: Secondary | ICD-10-CM | POA: Diagnosis not present

## 2023-01-11 DIAGNOSIS — R200 Anesthesia of skin: Secondary | ICD-10-CM | POA: Diagnosis not present

## 2023-01-11 DIAGNOSIS — R202 Paresthesia of skin: Secondary | ICD-10-CM | POA: Diagnosis not present

## 2023-01-13 ENCOUNTER — Other Ambulatory Visit (HOSPITAL_COMMUNITY): Payer: Self-pay

## 2023-01-18 DIAGNOSIS — M4807 Spinal stenosis, lumbosacral region: Secondary | ICD-10-CM | POA: Diagnosis not present

## 2023-01-18 DIAGNOSIS — M5416 Radiculopathy, lumbar region: Secondary | ICD-10-CM | POA: Diagnosis not present

## 2023-01-19 ENCOUNTER — Telehealth: Payer: Self-pay

## 2023-01-19 DIAGNOSIS — M25642 Stiffness of left hand, not elsewhere classified: Secondary | ICD-10-CM | POA: Diagnosis not present

## 2023-01-19 DIAGNOSIS — S63659A Sprain of metacarpophalangeal joint of unspecified finger, initial encounter: Secondary | ICD-10-CM | POA: Diagnosis not present

## 2023-01-19 DIAGNOSIS — M79642 Pain in left hand: Secondary | ICD-10-CM | POA: Diagnosis not present

## 2023-01-19 DIAGNOSIS — M24131 Other articular cartilage disorders, right wrist: Secondary | ICD-10-CM | POA: Diagnosis not present

## 2023-01-19 DIAGNOSIS — G5621 Lesion of ulnar nerve, right upper limb: Secondary | ICD-10-CM | POA: Diagnosis not present

## 2023-01-19 NOTE — Telephone Encounter (Signed)
Pharmacy please advise on holding Xarelto prior to lumbar ESI scheduled for TBD. Thank you.

## 2023-01-19 NOTE — Telephone Encounter (Signed)
...     Pre-operative Risk Assessment    Patient Name: [name]  DOB: [birth year] MRN: [id number]      Request for Surgical Clearance    Procedure:   LUMBAR EPIDURAL INJECTION  Date of Surgery:  Clearance TBD                                 Surgeon:  DR Delman Kitten Surgeon's Group or Practice Name:  [facility] Phone number:  [phone] Fax number:  [phone]   Type of Clearance Requested:   - Medical  - Pharmacy:  Hold Rivaroxaban (Xarelto) HOLD FOR 3 DAYS   Type of Anesthesia:  Not Indicated   Additional requests/questions:   LAST O/V 04/13/22, NO NEW APPT  Signed, Renee Ramus   01/19/2023, 11:18 AM

## 2023-01-19 NOTE — Telephone Encounter (Signed)
Patient scheduled for tele visit on 01/27/23. Med rec and consent done

## 2023-01-19 NOTE — Telephone Encounter (Signed)
   Name: [name]  DOB: 24-Dec-1950  MRN: [id number]  Primary Cardiologist: Charlton Haws, MD   Preoperative team, please contact this patient and set up a phone call appointment for further preoperative risk assessment. Please obtain consent and complete medication review. Thank you for your help.  I confirm that guidance regarding antiplatelet and oral anticoagulation therapy has been completed and, if necessary, noted below.  Per office protocol, patient can hold Xarelto for 3 days prior to procedure.    I also confirmed the patient resides in the state of [place]. As per [facility] Medical Board telemedicine laws, the patient must reside in the state in which the provider is licensed.   Napoleon Form, Leodis Rains, NP 01/19/2023, 12:43 PM [facility]

## 2023-01-19 NOTE — Telephone Encounter (Signed)
1st attempt to reach pt to schedule tele visit. Lvm  

## 2023-01-19 NOTE — Telephone Encounter (Signed)
Patient with diagnosis of afib on Xarelto for anticoagulation.    Procedure: LUMBAR EPIDURAL INJECTION  Date of procedure: TBD   CHA2DS2-VASc Score = 5   This indicates a 7.2% annual risk of stroke. The patient's score is based upon: CHF History: 1 HTN History: 1 Diabetes History: 1 Stroke History: 0 Vascular Disease History: 0 Age Score: 1 Gender Score: 1      CrCl 45 ml/min Platelet count 378  Per office protocol, patient can hold Xarelto for 3 days prior to procedure.    **This guidance is not considered finalized until pre-operative APP has relayed final recommendations.**

## 2023-01-19 NOTE — Telephone Encounter (Signed)
  Patient Consent for Virtual Visit         [name] has provided verbal consent on 01/19/2023 for a virtual visit (video or telephone).   CONSENT FOR VIRTUAL VISIT FOR:  [name]  By participating in this virtual visit I agree to the following:  I hereby voluntarily request, consent and authorize [facility] and its employed or contracted physicians, physician assistants, nurse practitioners or other licensed health care professionals (the Practitioner), to provide me with telemedicine health care services (the "Services") as deemed necessary by the treating Practitioner. I acknowledge and consent to receive the Services by the Practitioner via telemedicine. I understand that the telemedicine visit will involve communicating with the Practitioner through live audiovisual communication technology and the disclosure of certain medical information by electronic transmission. I acknowledge that I have been given the opportunity to request an in-person assessment or other available alternative prior to the telemedicine visit and am voluntarily participating in the telemedicine visit.  I understand that I have the right to withhold or withdraw my consent to the use of telemedicine in the course of my care at any time, without affecting my right to future care or treatment, and that the Practitioner or I may terminate the telemedicine visit at any time. I understand that I have the right to inspect all information obtained and/or recorded in the course of the telemedicine visit and may receive copies of available information for a reasonable fee.  I understand that some of the potential risks of receiving the Services via telemedicine include:  Delay or interruption in medical evaluation due to technological equipment failure or disruption; Information transmitted may not be sufficient (e.g. poor resolution of images) to allow for appropriate medical decision making by the Practitioner;  and/or  In rare instances, security protocols could fail, causing a breach of personal health information.  Furthermore, I acknowledge that it is my responsibility to provide information about my medical history, conditions and care that is complete and accurate to the best of my ability. I acknowledge that Practitioner's advice, recommendations, and/or decision may be based on factors not within their control, such as incomplete or inaccurate data provided by me or distortions of diagnostic images or specimens that may result from electronic transmissions. I understand that the practice of medicine is not an exact science and that Practitioner makes no warranties or guarantees regarding treatment outcomes. I acknowledge that a copy of this consent can be made available to me via my patient portal [facility] MyChart), or I can request a printed copy by calling the office of [facility].    I understand that my insurance will be billed for this visit.   I have read or had this consent read to me. I understand the contents of this consent, which adequately explains the benefits and risks of the Services being provided via telemedicine.  I have been provided ample opportunity to ask questions regarding this consent and the Services and have had my questions answered to my satisfaction. I give my informed consent for the services to be provided through the use of telemedicine in my medical care

## 2023-01-25 DIAGNOSIS — S63659A Sprain of metacarpophalangeal joint of unspecified finger, initial encounter: Secondary | ICD-10-CM | POA: Diagnosis not present

## 2023-01-25 DIAGNOSIS — M79642 Pain in left hand: Secondary | ICD-10-CM | POA: Diagnosis not present

## 2023-01-25 DIAGNOSIS — M24131 Other articular cartilage disorders, right wrist: Secondary | ICD-10-CM | POA: Diagnosis not present

## 2023-01-25 DIAGNOSIS — M25642 Stiffness of left hand, not elsewhere classified: Secondary | ICD-10-CM | POA: Diagnosis not present

## 2023-01-25 DIAGNOSIS — R200 Anesthesia of skin: Secondary | ICD-10-CM | POA: Diagnosis not present

## 2023-01-25 DIAGNOSIS — M25531 Pain in right wrist: Secondary | ICD-10-CM | POA: Diagnosis not present

## 2023-01-25 DIAGNOSIS — G5621 Lesion of ulnar nerve, right upper limb: Secondary | ICD-10-CM | POA: Diagnosis not present

## 2023-01-25 DIAGNOSIS — R202 Paresthesia of skin: Secondary | ICD-10-CM | POA: Diagnosis not present

## 2023-01-26 ENCOUNTER — Other Ambulatory Visit: Payer: Self-pay | Admitting: Medical Genetics

## 2023-01-26 DIAGNOSIS — Z006 Encounter for examination for normal comparison and control in clinical research program: Secondary | ICD-10-CM

## 2023-01-27 ENCOUNTER — Ambulatory Visit: Payer: 59 | Attending: Internal Medicine | Admitting: Nurse Practitioner

## 2023-01-27 ENCOUNTER — Other Ambulatory Visit (HOSPITAL_COMMUNITY)
Admission: RE | Admit: 2023-01-27 | Discharge: 2023-01-27 | Disposition: A | Discharge status: Home / Self Care | Payer: Self-pay | Source: Ambulatory Visit | Attending: Medical Genetics | Admitting: Medical Genetics

## 2023-01-27 DIAGNOSIS — Z01810 Encounter for preprocedural cardiovascular examination: Secondary | ICD-10-CM

## 2023-01-27 DIAGNOSIS — Z006 Encounter for examination for normal comparison and control in clinical research program: Secondary | ICD-10-CM

## 2023-01-27 NOTE — Progress Notes (Signed)
Virtual Visit via Telephone Note   Because of [name]'s co-morbid illnesses, she is at least at moderate risk for complications without adequate follow up.  This format is felt to be most appropriate for this patient at this time.  The patient did not have access to video technology/had technical difficulties with video requiring transitioning to audio format only (telephone).  All issues noted in this document were discussed and addressed.  No physical exam could be performed with this format.  Please refer to the patient's chart for her consent to telehealth for [facility].  Evaluation Performed:  Preoperative cardiovascular risk assessment _____________   Date:  01/27/2023   Patient ID:  [name], DOB Feb 20, 1951, MRN [id number] Patient Location:  Home Provider location:   Office  Primary Care Provider:  Thana Ates, MD Primary Cardiologist:  Charlton Haws, MD  Chief Complaint / Patient Profile   72 y.o. y/o female with a h/o paroxysmal atrial fibrillation on Xarelto and flecainide, chronic diastolic heart failure, hypertension, hyperlipidemia, type 2 diabetes, CKD, and obesity who is pending lumbar epidural injection with Dr. Aileen Fass [facility] and presents today for telephonic preoperative cardiovascular risk assessment.  History of Present Illness    [name] is a 72 y.o. female who presents via audio/video conferencing for a telehealth visit today.  Pt was last seen in cardiology clinic on 04/13/2022 by Dr. Eden Emms. At that time MALERIE PETRONE was doing well.  The patient is now pending procedure as outlined above. Since her last visit, she has done well from a cardiac standpoint.  She denies chest pain, palpitations, dyspnea, pnd, orthopnea, n, v, dizziness, syncope, edema, weight gain, or early satiety. All other systems reviewed and are otherwise negative except as noted above.   Past Medical History    Past Medical  History:  Diagnosis Date   A-fib (HCC)    Anemia    hx of   Arthritis    Chest pain    Constipation    Depression    Depression    Diabetes mellitus ORAL MEDS   Diabetic retinopathy (HCC)    Diabetic retinopathy (HCC)    Dyspnea    with minimal exertion-deconditioned   Dyspnea    Dysrhythmia    a-fib   Food allergy    GERD (gastroesophageal reflux disease)    occasional   Gout    Heartburn    History of kidney stones    Hypercholesteremia    Hyperlipidemia    Hypertension    Hypertensive kidney disease    Hypothyroidism    Joint pain    Kidney problem    Knee pain, right    Left arm numbness DUE TO CERVICAL PINCHED NERVE   Obesity    OSA on CPAP    uses CPAP nightly   Osteoarthritis    Osteoarthritis    Palpitations    Pinched nerve in neck    Pleurisy    Pneumonia    hx of   PONV (postoperative nausea and vomiting)    for 3-4 days after general anesthesia approx 10-12 years ago   Sciatica    Shortness of breath    Sleep apnea    Spinal stenosis    Swelling of both lower extremities    Swelling of knee joint, right    Synovitis of knee RIGHT   Trigger finger, left    left index   Vitamin D deficiency  Past Surgical History:  Procedure Laterality Date   CESAREAN SECTION  X3   KNEE ARTHROSCOPY  04/20/2011   Procedure: ARTHROSCOPY KNEE;  Surgeon: Loanne Drilling, MD;  Location: [facility];  Service: Orthopedics;  Laterality: Right;  WITH SYNOVECTOMY   LEFT CARPAL TUNNEL / LEFT MIDDLE & RING FINGER TRIGGER RELEASE  08-26-2008   LEFT SHOULDER ARTHROSCOPY W/ DEBRIDEMENT  09-09-2003   LEFT SHOULDER ARTHROSCOPY/ LEFT THUMB TRIGGER RELEASE  02-22-2005   PHOTOCOAGULATION WITH LASER Right 03/20/2018   Procedure: PHOTOCOAGULATION WITH LASER;  Surgeon: Carmela Rima, MD;  Location: [facility] OR;  Service: Ophthalmology;  Laterality: Right;   PULLEY RELEASE LEFT LONG FINGER  07-14-2009   RADIAL HEAD ARTHROPLASTY Right 06/17/2018   Procedure: RADIAL HEAD  ARTHROPLASTY;  Surgeon: Bjorn Pippin, MD;  Location: [facility] OR;  Service: Orthopedics;  Laterality: Right;   RIGHT CARPAL TUNNEL/ RIGHT THUMB TRIGGER RELEASE'S  11-28-2006   RIGHT SHOULDER ARTHROSCOPY W/ ROTATOR CUFF REPAIR  01-13-2004   SHOULDER ARTHROSCOPY DISTAL CLAVICLE EXCISION AND OPEN ROTATOR CUFF REPAIR  09-07-2004   LEFT   SHOULDER ARTHROSCOPY W/ ACROMIAL REPAIR  11-29-2005   LEFT   SYNOVECTOMY Left 06/20/2022   Procedure: left index finger and left long finger metacarpal phalangel joint syonvectomy;  Surgeon: Dairl Ponder, MD;  Location: [facility] OR;  Service: Orthopedics;  Laterality: Left;  needs 1 hour of time..   TONSILLECTOMY AND ADENOIDECTOMY  child   TOTAL KNEE ARTHROPLASTY  12-14-2009   RIGHT   TOTAL KNEE ARTHROPLASTY Left 11/05/2012   Procedure: LEFT TOTAL KNEE ARTHROPLASTY;  Surgeon: Loanne Drilling, MD;  Location: [facility];  Service: Orthopedics;  Laterality: Left;   TOTAL KNEE REVISION Right 07/29/2016   Procedure: RIGHT KNEE POLY-LINER EXCHANGE;  Surgeon: Kathryne Hitch, MD;  Location: [facility];  Service: Orthopedics;  Laterality: Right;   TRIGGER FINGER RELEASE Right 02/21/2013   Procedure: RIGHT RING A-1 PULLEY RELEASE    (MINOR PROCEDURE) ;  Surgeon: Wyn Forster., MD;  Location[facility];  Service: Orthopedics;  Laterality: Right;   TRIGGER FINGER RELEASE Left 09/22/2016   Procedure: RELEASE TRIGGER FINGER LEFT INDEX FINGER;  Surgeon: Kathryne Hitch, MD;  Location: [facility] OR;  Service: Orthopedics;  Laterality: Left;   TRIGGER FINGER RELEASE Right 01/20/2017   Procedure: RELEASE TRIGGER FINGER/A-1 PULLEY RIGHT INDEX FINGER;  Surgeon: Marcene Corning, MD;  Location: [facility];  Service: Orthopedics;  Laterality: Right;   VITRECTOMY 25 GAUGE WITH SCLERAL BUCKLE Right 03/20/2018   Procedure: RIGHT EYE VITRECTOMY WITH  ENDOLASER PARENTAL PHOTOCOAGULATION 25 GAUGE;  Surgeon: Carmela Rima, MD;  Location[facility] OR;  Service:  Ophthalmology;  Laterality: Right;   WRIST ARTHROSCOPY WITH DEBRIDEMENT Right 05/01/2019   Procedure: WRIST ARTHROSCOPY WITH DEBRIDEMENT;  Surgeon: Dairl Ponder, MD;  Location: [facility];  Service: Orthopedics;  Laterality: Right;    Allergies  Allergies  Allergen Reactions   Actos [Pioglitazone Hydrochloride] Swelling and Other (See Comments)    Numbness in lips, HEADACHES SWELLING REACTION UNSPECIFIED    Avandia [Rosiglitazone] Swelling    Numbness in lips, headaches  SWELLING REACTION UNSPECIFIED    Tetanus Toxoids Other (See Comments)    Arm swelling, fainted, ?Respiratory distress  (horse serum)   Food Nausea Only    Eggplant    Home Medications    Prior to Admission medications   Medication Sig Start Date End Date Taking? Authorizing Provider  Aflibercept (EYLEA) 2 MG/0.05ML SOLN See admin instructions. Every six  weeks 09/03/20   [provider]  amLODipine (NORVASC) 5 MG tablet Take 1 tablet (5 mg) by mouth daily. 09/09/22   Dyann Kief, PA-C  amoxicillin (AMOXIL) 500 MG capsule Take 1 capsule (500 mg total) by mouth every 8 (eight) hours until all taken Patient taking differently: Take 2,000 mg by mouth See admin instructions. 4 HOURS PRIOR TO DENTAL VISITS. 01/18/22     amoxicillin (AMOXIL) 500 MG capsule Take 4 capsules (2,000 mg total) by mouth 1 hour prior to dental appointment 10/31/22     atorvastatin (LIPITOR) 40 MG tablet Take 1 tablet (40 mg total) by mouth daily. 12/07/22     Cholecalciferol (VITAMIN D) 50 MCG (2000 UT) tablet Take 2,000 Units by mouth daily.    [provider]  Continuous Blood Gluc Receiver (FREESTYLE LIBRE 3 READER) DEVI Use to check blood sugar 06/03/22   Talmage Coin, MD  Continuous Glucose Sensor (FREESTYLE LIBRE 3 SENSOR) MISC Use as directed to check blood sugar. Change every 14 days 06/03/22   Talmage Coin, MD  COVID-19 mRNA vaccine, Pfizer, (COMIRNATY) syringe Inject into the muscle. 12/08/22   Judyann Munson, MD  Dapagliflozin Pro-metFORMIN ER (XIGDUO XR) 12-998 MG TB24 Take 1 tablet by mouth daily. 03/30/22     Dulaglutide (TRULICITY) 4.5 MG/0.5ML SOAJ Inject 4.5 mg into the skin once a week. 01/09/23     DULoxetine (CYMBALTA) 60 MG capsule Take 1 capsule (60 mg total) by mouth daily. Patient taking differently: Take 60 mg by mouth at bedtime. 05/19/22     flecainide (TAMBOCOR) 100 MG tablet Take 1 tablet (100 mg total) by mouth 2 (two) times daily. 10/10/22   Camnitz, Andree Coss, MD  glucose blood test strip Use to test blood sugar 3 times a day as directed 05/18/22     hydrALAZINE (APRESOLINE) 25 MG tablet TAKE 1 TABLET BY MOUTH TWO TIMES DAILY WITH BREAKFAST AND DINNER 03/30/21     hydrALAZINE (APRESOLINE) 25 MG tablet Take 1 tablet (25 mg total) by mouth 2 (two) times daily with breakfast and dinner 08/23/22     ibuprofen (ADVIL) 200 MG tablet Take 200 mg by mouth every 6 (six) hours as needed for moderate pain.    [provider]  indomethacin (INDOCIN) 50 MG capsule Take 1 capsule (50 mg total) by mouth 2(two) to 3 (three) times daily as needed for gout pain 02/13/22     indomethacin (INDOCIN) 50 MG capsule Take 1 capsule by mouth 2-3 times a day as needed for gout pain 01/09/23     insulin glargine-yfgn (SEMGLEE, YFGN,) 100 UNIT/ML Pen Inject 70 Units into the skin 2 (two) times daily. 11/09/22     Insulin Pen Needle (UNIFINE PENTIPS) 32G X 4 MM MISC Use to adminster insulin subcutaneously twice daily 05/20/21     levothyroxine (SYNTHROID) 137 MCG tablet Take 1 tablet (137 mcg total) by mouth every morning on an empty stomach 03/30/22     losartan (COZAAR) 50 MG tablet Take 1 tablet (50 mg total) by mouth daily. 12/12/22     Melatonin 10 MG CAPS Take 10 mg by mouth at bedtime as needed (sleep).    [provider]  metoprolol tartrate (LOPRESSOR) 50 MG tablet Take 1 tablet (50 mg total) by mouth 2 (two) times daily. Please keep scheduled appointment for additional refills. 06/15/22    Wendall Stade, MD  omeprazole (PRILOSEC) 20 MG capsule Take 1 capsule (20 mg total) by mouth daily. 02/13/22  rivaroxaban (XARELTO) 20 MG TABS tablet Take 1 tablet (20 mg total) by mouth daily with supper. 10/10/22   Wendall Stade, MD  spironolactone (ALDACTONE) 25 MG tablet Take 1/2 tablet by mouth every other day. 05/24/22   Dyann Kief, PA-C    Physical Exam    Vital Signs:  Arshika Tams Ohlin does not have vital signs available for review today.  Given telephonic nature of communication, physical exam is limited. AAOx3. NAD. Normal affect.  Speech and respirations are unlabored.  Accessory Clinical Findings    None  Assessment & Plan    1.  Preoperative Cardiovascular Risk Assessment:  According to the Revised Cardiac Risk Index (RCRI), her Perioperative Risk of Major Cardiac Event is (%): 0.9. Her Functional Capacity in METs is: 4.7 according to the [facility] Activity Status Index (DASI). Therefore, based on ACC/AHA guidelines, patient would be at acceptable risk for the planned procedure without further cardiovascular testing.  The patient was advised that if she develops new symptoms prior to surgery to contact our office to arrange for a follow-up visit, and she verbalized understanding.  Per office protocol, patient can hold Xarelto for 3 days prior to procedure. Please resume Xarelto as soon as possible postprocedure, at the discretion of the surgeon.   A copy of this note will be routed to requesting surgeon.  Time:   Today, I have spent 5 minutes with the patient with telehealth technology discussing medical history, symptoms, and management plan.     Joylene Grapes, NP  01/27/2023, 1:54 PM

## 2023-01-30 ENCOUNTER — Ambulatory Visit: Payer: 59

## 2023-01-31 ENCOUNTER — Ambulatory Visit (INDEPENDENT_AMBULATORY_CARE_PROVIDER_SITE_OTHER): Payer: 59 | Admitting: Family Medicine

## 2023-01-31 ENCOUNTER — Encounter (INDEPENDENT_AMBULATORY_CARE_PROVIDER_SITE_OTHER): Payer: Self-pay | Admitting: Family Medicine

## 2023-01-31 VITALS — BP 130/55 | HR 72 | Temp 97.9°F | Ht 60.0 in | Wt 249.0 lb

## 2023-01-31 DIAGNOSIS — G4733 Obstructive sleep apnea (adult) (pediatric): Secondary | ICD-10-CM | POA: Diagnosis not present

## 2023-01-31 DIAGNOSIS — E669 Obesity, unspecified: Secondary | ICD-10-CM

## 2023-01-31 DIAGNOSIS — M545 Low back pain, unspecified: Secondary | ICD-10-CM

## 2023-01-31 DIAGNOSIS — E11319 Type 2 diabetes mellitus with unspecified diabetic retinopathy without macular edema: Secondary | ICD-10-CM | POA: Diagnosis not present

## 2023-01-31 DIAGNOSIS — G8929 Other chronic pain: Secondary | ICD-10-CM

## 2023-01-31 DIAGNOSIS — Z794 Long term (current) use of insulin: Secondary | ICD-10-CM | POA: Diagnosis not present

## 2023-01-31 DIAGNOSIS — Z6841 Body Mass Index (BMI) 40.0 and over, adult: Secondary | ICD-10-CM | POA: Diagnosis not present

## 2023-01-31 NOTE — Progress Notes (Signed)
.smr  Office: [phone]  /  Fax: [phone]  WEIGHT SUMMARY AND BIOMETRICS  Anthropometric Measurements Height: 5' (1.524 m) Weight: 249 lb (112.9 kg) BMI (Calculated): 48.63 Weight at Last Visit: 249 lb Weight Lost Since Last Visit: 0 Weight Gained Since Last Visit: 0 Starting Weight: 258 lb Total Weight Loss (lbs): 9 lb (4.082 kg)   Body Composition  Body Fat %: 58.6 % Fat Mass (lbs): 146.2 lbs Muscle Mass (lbs): 98 lbs Visceral Fat Rating : 23   Other Clinical Data Fasting: No Labs: No Today's Visit #: 10 Starting Date: 05/30/22    Chief Complaint: OBESITY   History of Present Illness   The patient, with a history of type 2 diabetes and obesity, presents with ongoing back pain and hand pain. She has been managing her conditions with diet and exercise, maintaining her weight over the past month and adhering to her category two eating plan about half of the time. She exercises a couple of times a week for about an hour each time.  The patient has been experiencing hand pain and is scheduled to see Dr. Merlyn Lot for this issue. She also reports persistent back pain and has a facet injection scheduled for the 10th of the month. She has previously had an injection for back pain which provided relief for two weeks before the pain returned.  The patient's blood sugars have been well-controlled, with a recent A1C of 7.1, lower than previous readings. She has experienced some low blood sugars, which she can feel, and her insulin dosage has been adjusted from 70 units twice a day to 68 units twice a day.  The patient has been managing her diet by eating earlier in the day, around five or five-thirty, and using high-protein meals and salads. She does not struggle with nighttime snacking. She has been waking up to urinate at night due to increased water intake.  The patient is planning to retire at the end of February and is considering volunteering, possibly at an [employer]/restorer,  to keep herself occupied and avoid snacking. She hopes that her back pain will improve to allow her to be more active.          PHYSICAL EXAM:  Blood pressure (!) 130/55, pulse 72, temperature 97.9 F (36.6 C), height 5' (1.524 m), weight 249 lb (112.9 kg), SpO2 95%. Body mass index is 48.63 kg/m.  DIAGNOSTIC DATA REVIEWED:  BMET    Component Value Date/Time   NA 139 05/30/2022 1048   K 4.9 05/30/2022 1048   CL 103 05/30/2022 1048   CO2 17 (L) 05/30/2022 1048   GLUCOSE 186 (H) 05/30/2022 1048   GLUCOSE 226 (H) 08/06/2019 0111   BUN 34 (H) 05/30/2022 1048   CREATININE 1.27 (H) 05/30/2022 1048   CALCIUM 9.2 05/30/2022 1048   GFRNONAA 37 (L) 08/06/2019 0111   GFRAA 43 (L) 08/06/2019 0111   Lab Results  Component Value Date   HGBA1C 7.7 (H) 05/30/2022   HGBA1C 7.9 09/18/2014   Lab Results  Component Value Date   INSULIN 24.3 05/30/2022   Lab Results  Component Value Date   TSH 0.790 05/30/2022   CBC    Component Value Date/Time   WBC 10.0 05/30/2022 1048   WBC 11.0 (H) 08/06/2019 0111   RBC 4.53 05/30/2022 1048   RBC 4.57 08/06/2019 0111   HGB 12.4 05/30/2022 1048   HCT 40.0 05/30/2022 1048   PLT 378 05/30/2022 1048   MCV 88 05/30/2022 1048   MCH  27.4 05/30/2022 1048   MCH 29.3 08/06/2019 0111   MCHC 31.0 (L) 05/30/2022 1048   MCHC 31.4 08/06/2019 0111   RDW 15.4 05/30/2022 1048   Iron Studies No results found for: "IRON", "TIBC", "FERRITIN", "IRONPCTSAT" Lipid Panel     Component Value Date/Time   CHOL 111 05/30/2022 1048   TRIG 114 05/30/2022 1048   HDL 35 (L) 05/30/2022 1048   LDLCALC 55 05/30/2022 1048   Hepatic Function Panel     Component Value Date/Time   PROT 6.6 05/30/2022 1048   ALBUMIN 3.9 05/30/2022 1048   AST 19 05/30/2022 1048   ALT 13 05/30/2022 1048   ALKPHOS 87 05/30/2022 1048   BILITOT <0.2 05/30/2022 1048      Component Value Date/Time   TSH 0.790 05/30/2022 1048   Nutritional Lab Results  Component Value Date    VD25OH 34.8 05/30/2022   VD25OH 31.4 11/28/2017   VD25OH 30.5 07/18/2017     Assessment and Plan    Type 2 Diabetes Mellitus Type 2 diabetes mellitus with recent A1c of 7.1%, showing improvement. Blood glucose levels are generally stable with occasional early morning hypoglycemia (70-75 mg/dL). Currently on 68 units of insulin twice daily, reduced from 70 units. Patient reports hypoglycemia symptoms and adjusts accordingly. - Monitor blood glucose levels regularly - Continue 68 units of insulin twice daily - Treat hypoglycemia symptoms as necessary - Follow up with endocrinologist as needed  Chronic Back Pain Chronic back pain with upcoming facet injection scheduled on the 10th. Previous injection in July provided two weeks of relief. Patient is comfortable with the procedure and location. - Proceed with scheduled facet injection - Monitor response and report changes in pain levels - Will adjust exercise as tolerated depending on pt's pain improvement  Obesity Obesity with ongoing diet and exercise efforts. Patient has maintained weight over the last month, following a category two eating plan about 50% of the time, and exercises a couple of times a week for about an hour each session. Discussed challenges with preparing healthy lunches and nighttime snacking. Encouraged to avoid taking home leftovers from holiday gatherings to prevent overeating. - Continue current diet and exercise regimen - Prepare healthy lunches the night before - Incorporate more vegetables and high-protein meals - Avoid nighttime snacking - Monitor weight regularly  General Health Maintenance Discussed maintaining weight during the holiday season and strategies to avoid overeating. Highlighted the average [place] weight gain of 13 pounds between Halloween and New Year's. - Maintain current weight through the holiday season - Avoid taking home leftovers from holiday gatherings - Consider healthy alternatives  for holiday meals and desserts - Schedule next appointment in December  Follow-up - Schedule December appointment - Consider scheduling January appointment during next visit.         I have personally spent 30 minutes total time today in preparation, patient care, and documentation for this visit, including the following: review of clinical lab tests; review of medical tests/procedures/services.    She was informed of the importance of frequent follow up visits to maximize her success with intensive lifestyle modifications for her multiple health conditions.    Quillian Quince, MD

## 2023-02-01 DIAGNOSIS — M19042 Primary osteoarthritis, left hand: Secondary | ICD-10-CM | POA: Diagnosis not present

## 2023-02-01 DIAGNOSIS — M79642 Pain in left hand: Secondary | ICD-10-CM | POA: Diagnosis not present

## 2023-02-03 ENCOUNTER — Other Ambulatory Visit (HOSPITAL_COMMUNITY): Payer: Self-pay

## 2023-02-03 MED ORDER — PREVNAR 20 0.5 ML IM SUSY
PREFILLED_SYRINGE | INTRAMUSCULAR | 0 refills | Status: DC
  Filled 2023-02-03: qty 0.5, 30d supply, fill #0

## 2023-02-04 LAB — HELIX MOLECULAR SCREEN: Genetic Analysis Overall Interpretation: NEGATIVE

## 2023-02-04 LAB — GENECONNECT MOLECULAR SCREEN

## 2023-02-13 ENCOUNTER — Other Ambulatory Visit: Payer: Self-pay

## 2023-02-13 ENCOUNTER — Other Ambulatory Visit (HOSPITAL_COMMUNITY): Payer: Self-pay

## 2023-02-13 DIAGNOSIS — E113511 Type 2 diabetes mellitus with proliferative diabetic retinopathy with macular edema, right eye: Secondary | ICD-10-CM | POA: Diagnosis not present

## 2023-02-13 MED ORDER — OMEPRAZOLE 20 MG PO CPDR
20.0000 mg | DELAYED_RELEASE_CAPSULE | Freq: Every day | ORAL | 0 refills | Status: DC
  Filled 2023-02-13: qty 90, 90d supply, fill #0

## 2023-02-16 ENCOUNTER — Other Ambulatory Visit (HOSPITAL_COMMUNITY): Payer: Self-pay

## 2023-02-21 DIAGNOSIS — M5416 Radiculopathy, lumbar region: Secondary | ICD-10-CM | POA: Diagnosis not present

## 2023-02-22 DIAGNOSIS — E113512 Type 2 diabetes mellitus with proliferative diabetic retinopathy with macular edema, left eye: Secondary | ICD-10-CM | POA: Diagnosis not present

## 2023-02-23 ENCOUNTER — Ambulatory Visit
Admission: RE | Admit: 2023-02-23 | Discharge: 2023-02-23 | Disposition: A | Discharge status: Home / Self Care | Payer: 59 | Source: Ambulatory Visit | Attending: Internal Medicine | Admitting: Internal Medicine

## 2023-02-23 ENCOUNTER — Other Ambulatory Visit (HOSPITAL_COMMUNITY): Payer: Self-pay

## 2023-02-23 DIAGNOSIS — Z1231 Encounter for screening mammogram for malignant neoplasm of breast: Secondary | ICD-10-CM | POA: Diagnosis not present

## 2023-02-27 ENCOUNTER — Other Ambulatory Visit (HOSPITAL_COMMUNITY): Payer: Self-pay

## 2023-02-28 DIAGNOSIS — R200 Anesthesia of skin: Secondary | ICD-10-CM | POA: Diagnosis not present

## 2023-03-02 ENCOUNTER — Ambulatory Visit (INDEPENDENT_AMBULATORY_CARE_PROVIDER_SITE_OTHER): Payer: 59 | Admitting: Family Medicine

## 2023-03-02 ENCOUNTER — Encounter (INDEPENDENT_AMBULATORY_CARE_PROVIDER_SITE_OTHER): Payer: Self-pay | Admitting: Family Medicine

## 2023-03-02 VITALS — BP 121/48 | HR 66 | Temp 98.0°F | Ht 60.0 in | Wt 244.0 lb

## 2023-03-02 DIAGNOSIS — Z7985 Long-term (current) use of injectable non-insulin antidiabetic drugs: Secondary | ICD-10-CM

## 2023-03-02 DIAGNOSIS — E1122 Type 2 diabetes mellitus with diabetic chronic kidney disease: Secondary | ICD-10-CM | POA: Diagnosis not present

## 2023-03-02 DIAGNOSIS — Z6841 Body Mass Index (BMI) 40.0 and over, adult: Secondary | ICD-10-CM

## 2023-03-02 DIAGNOSIS — I129 Hypertensive chronic kidney disease with stage 1 through stage 4 chronic kidney disease, or unspecified chronic kidney disease: Secondary | ICD-10-CM | POA: Diagnosis not present

## 2023-03-02 DIAGNOSIS — G4733 Obstructive sleep apnea (adult) (pediatric): Secondary | ICD-10-CM | POA: Diagnosis not present

## 2023-03-02 DIAGNOSIS — I10 Essential (primary) hypertension: Secondary | ICD-10-CM

## 2023-03-02 DIAGNOSIS — Z794 Long term (current) use of insulin: Secondary | ICD-10-CM | POA: Diagnosis not present

## 2023-03-02 DIAGNOSIS — N183 Chronic kidney disease, stage 3 unspecified: Secondary | ICD-10-CM

## 2023-03-02 DIAGNOSIS — E669 Obesity, unspecified: Secondary | ICD-10-CM | POA: Diagnosis not present

## 2023-03-02 NOTE — Progress Notes (Signed)
.smr  Office: [phone]  /  Fax: [phone]  WEIGHT SUMMARY AND BIOMETRICS  Anthropometric Measurements Height: 5' (1.524 m) Weight: 244 lb (110.7 kg) BMI (Calculated): 47.65 Weight at Last Visit: 249 lb Weight Lost Since Last Visit: 5 lb Starting Weight: 258 lb Total Weight Loss (lbs): 14 lb (6.35 kg)   Body Composition  Body Fat %: 56.6 % Fat Mass (lbs): 138.2 lbs Muscle Mass (lbs): 100.4 lbs Visceral Fat Rating : 22   Other Clinical Data Fasting: No Labs: No Today's Visit #: 11 Starting Date: 05/30/22    Chief Complaint: OBESITY   History of Present Illness   The patient, with a history of hypertension, obesity, type 2 diabetes, and chronic kidney disease, presents for a routine follow-up. Her current regimen includes amlodipine 5mg , losartan 50mg , and metoprolol XL 50mg  daily. Today, her blood pressure is well controlled at 121/48. She has been actively working on diet, exercise, and weight loss to manage her hypertension and diabetes. Over the past month, she has lost 5 pounds and has been adhering to her category 2 eating plan 50% of the time. She has been walking for exercise 30 minutes, three times per week. Her current BMI is 47.  The patient also reports a recent injection for an unspecified condition, which has resulted in some improvement, though not complete resolution of symptoms. She has been experiencing numbness in her arm and hand, which has been affecting her ability to type and perform other tasks. She has an upcoming EMG scheduled in January to further investigate these symptoms.  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 However, she later started experiencing significant pain in the same area. She has been consulting with various specialists to determine the cause of this pain.  The patient's lifestyle includes regular walking for exercise, which she has been able to maintain despite the weather  by walking indoors at a hospital. She has also been successful in managing her diet, even during the holiday season, by avoiding cooking and leftovers. She has a supportive family network and is looking forward to upcoming holiday gatherings.          PHYSICAL EXAM:  Blood pressure (!) 121/48, pulse 66, temperature 98 F (36.7 C), height 5' (1.524 m), weight 244 lb (110.7 kg), SpO2 96%. Body mass index is 47.65 kg/m.  DIAGNOSTIC DATA REVIEWED:  BMET    Component Value Date/Time   NA 139 05/30/2022 1048   K 4.9 05/30/2022 1048   CL 103 05/30/2022 1048   CO2 17 (L) 05/30/2022 1048   GLUCOSE 186 (H) 05/30/2022 1048   GLUCOSE 226 (H) 08/06/2019 0111   BUN 34 (H) 05/30/2022 1048   CREATININE 1.27 (H) 05/30/2022 1048   CALCIUM 9.2 05/30/2022 1048   GFRNONAA 37 (L) 08/06/2019 0111   GFRAA 43 (L) 08/06/2019 0111   Lab Results  Component Value Date   HGBA1C 7.7 (H) 05/30/2022   HGBA1C 7.9 09/18/2014   Lab Results  Component Value Date   INSULIN 24.3 05/30/2022   Lab Results  Component Value Date   TSH 0.790 05/30/2022   CBC    Component Value Date/Time   WBC 10.0 05/30/2022 1048   WBC 11.0 (H) 08/06/2019 0111   RBC 4.53 05/30/2022 1048   RBC 4.57 08/06/2019 0111   HGB 12.4 05/30/2022 1048   HCT 40.0 05/30/2022 1048   PLT 378 05/30/2022 1048   MCV 88 05/30/2022 1048   MCH 27.4 05/30/2022  1048   MCH 29.3 08/06/2019 0111   MCHC 31.0 (L) 05/30/2022 1048   MCHC 31.4 08/06/2019 0111   RDW 15.4 05/30/2022 1048   Iron Studies No results found for: "IRON", "TIBC", "FERRITIN", "IRONPCTSAT" Lipid Panel     Component Value Date/Time   CHOL 111 05/30/2022 1048   TRIG 114 05/30/2022 1048   HDL 35 (L) 05/30/2022 1048   LDLCALC 55 05/30/2022 1048   Hepatic Function Panel     Component Value Date/Time   PROT 6.6 05/30/2022 1048   ALBUMIN 3.9 05/30/2022 1048   AST 19 05/30/2022 1048   ALT 13 05/30/2022 1048   ALKPHOS 87 05/30/2022 1048   BILITOT <0.2 05/30/2022 1048       Component Value Date/Time   TSH 0.790 05/30/2022 1048   Nutritional Lab Results  Component Value Date   VD25OH 34.8 05/30/2022   VD25OH 31.4 11/28/2017   VD25OH 30.5 07/18/2017     Assessment and Plan    Type 2 Diabetes Mellitus with Chronic Kidney Disease Blood pressure control is crucial for managing type 2 diabetes and chronic kidney disease. Current blood pressure is well controlled, aiding in the management of both conditions. - Continue current diabetes management plan -continue to work on dietary adjustments as discussed today  Hypertension Hypertension is well controlled with amlodipine 5 mg daily, losartan 50 mg daily, and metoprolol XL 50 mg daily. Blood pressure today is 121/48. Continued emphasis on diet and exercise has contributed to control. Patient has lost 5 pounds over the last month. - Continue current medications: amlodipine 5 mg daily, losartan 50 mg daily, metoprolol XL 50 mg daily - Continue diet and exercise regimen  Obesity BMI is 47. Patient has lost 5 pounds over the last month by following a category 2 eating plan 50% of the time and walking for exercise 30 minutes 3 times per week. Plans to continue these efforts during the holiday season. - Continue category 2 eating plan - Continue walking for exercise 30 minutes 3 times per week  General Health Maintenance Patient is engaging in regular physical activity and dietary modifications to support overall health. - Encourage continued adherence to diet and exercise regimen  Follow-up - Schedule follow-up appointments for January and February.         I have personally spent 35 minutes total time today in preparation, patient care, and documentation for this visit, including the following: review of clinical lab tests; review of medical tests/procedures/services.    She was informed of the importance of frequent follow up visits to maximize her success with intensive lifestyle modifications for  her multiple health conditions.    Quillian Quince, MD

## 2023-03-09 ENCOUNTER — Other Ambulatory Visit: Payer: Self-pay

## 2023-03-09 ENCOUNTER — Other Ambulatory Visit (HOSPITAL_COMMUNITY): Payer: Self-pay

## 2023-03-09 MED ORDER — INDOMETHACIN 50 MG PO CAPS
50.0000 mg | ORAL_CAPSULE | ORAL | 0 refills | Status: DC
  Filled 2023-03-09: qty 20, 6d supply, fill #0

## 2023-03-22 ENCOUNTER — Other Ambulatory Visit: Payer: Self-pay

## 2023-03-29 DIAGNOSIS — G5602 Carpal tunnel syndrome, left upper limb: Secondary | ICD-10-CM | POA: Diagnosis not present

## 2023-04-02 DIAGNOSIS — G4733 Obstructive sleep apnea (adult) (pediatric): Secondary | ICD-10-CM | POA: Diagnosis not present

## 2023-04-05 DIAGNOSIS — E113511 Type 2 diabetes mellitus with proliferative diabetic retinopathy with macular edema, right eye: Secondary | ICD-10-CM | POA: Diagnosis not present

## 2023-04-06 ENCOUNTER — Ambulatory Visit (INDEPENDENT_AMBULATORY_CARE_PROVIDER_SITE_OTHER): Payer: 59 | Admitting: Family Medicine

## 2023-04-06 VITALS — BP 184/66 | HR 68 | Temp 98.1°F | Ht 60.0 in | Wt 253.0 lb

## 2023-04-06 DIAGNOSIS — Z7985 Long-term (current) use of injectable non-insulin antidiabetic drugs: Secondary | ICD-10-CM | POA: Diagnosis not present

## 2023-04-06 DIAGNOSIS — E669 Obesity, unspecified: Secondary | ICD-10-CM | POA: Diagnosis not present

## 2023-04-06 DIAGNOSIS — I10 Essential (primary) hypertension: Secondary | ICD-10-CM

## 2023-04-06 DIAGNOSIS — Z6841 Body Mass Index (BMI) 40.0 and over, adult: Secondary | ICD-10-CM

## 2023-04-06 DIAGNOSIS — E1165 Type 2 diabetes mellitus with hyperglycemia: Secondary | ICD-10-CM

## 2023-04-06 DIAGNOSIS — Z794 Long term (current) use of insulin: Secondary | ICD-10-CM | POA: Diagnosis not present

## 2023-04-06 DIAGNOSIS — N1831 Chronic kidney disease, stage 3a: Secondary | ICD-10-CM

## 2023-04-06 NOTE — Progress Notes (Signed)
.smr  Office: [phone]  /  Fax: [phone]  WEIGHT SUMMARY AND BIOMETRICS  Anthropometric Measurements Height: 5' (1.524 m) Weight: 253 lb (114.8 kg) BMI (Calculated): 49.41 Weight at Last Visit: 244 lb Weight Lost Since Last Visit: 0 Weight Gained Since Last Visit: 9 lb Starting Weight: 258 lb Total Weight Loss (lbs): 3 lb (1.361 kg)   Body Composition  Body Fat %: 59.5 % Fat Mass (lbs): 150.6 lbs Muscle Mass (lbs): 97.2 lbs Visceral Fat Rating : 24   Other Clinical Data Fasting: No Labs: No Today's Visit #: 12 Starting Date: 05/30/22    Chief Complaint: OBESITY   History of Present Illness   The patient, with a history of obesity, uncontrolled type 2 diabetes, and uncontrolled hypertension, presents for a follow-up visit after a five-week interval. During this period, the patient has gained nine pounds, indicating a worsening of her health status. The patient reports adherence to a category two eating plan about fifty percent of the time and has been attempting to increase physical activity by walking for approximately fifteen minutes, five times per week.  The patient denies any changes in medication regimen, although there were some discrepancies noted in the medication list, including doubled doses of hydralazine, Indocin, and metoprolol. The patient also reports a recent course of amoxicillin following a dental procedure.  The patient's blood pressure was noted to be significantly elevated during the visit, a finding that was surprising to the patient as her blood pressure readings have been generally well-controlled recently. The patient does not currently monitor blood pressure at home but acknowledges the need to invest in a home blood pressure monitor for more accurate readings.  Despite the holiday season, the patient reports being more conscientious about food choices, limiting intake of fried foods and focusing on lean proteins. However, the patient has  noticed a significant weight gain, which she attributes to a possible decrease in metabolic rate. The patient acknowledges the need for a dietary reset and expresses willingness to try a low carbohydrate plan to manage both weight and blood sugar levels.          PHYSICAL EXAM:  Blood pressure (!) 184/66, pulse 68, temperature 98.1 F (36.7 C), height 5' (1.524 m), weight 253 lb (114.8 kg), SpO2 91%. Body mass index is 49.41 kg/m.  DIAGNOSTIC DATA REVIEWED:  BMET    Component Value Date/Time   NA 139 05/30/2022 1048   K 4.9 05/30/2022 1048   CL 103 05/30/2022 1048   CO2 17 (L) 05/30/2022 1048   GLUCOSE 186 (H) 05/30/2022 1048   GLUCOSE 226 (H) 08/06/2019 0111   BUN 34 (H) 05/30/2022 1048   CREATININE 1.27 (H) 05/30/2022 1048   CALCIUM 9.2 05/30/2022 1048   GFRNONAA 37 (L) 08/06/2019 0111   GFRAA 43 (L) 08/06/2019 0111   Lab Results  Component Value Date   HGBA1C 7.7 (H) 05/30/2022   HGBA1C 7.9 09/18/2014   Lab Results  Component Value Date   INSULIN 24.3 05/30/2022   Lab Results  Component Value Date   TSH 0.790 05/30/2022   CBC    Component Value Date/Time   WBC 10.0 05/30/2022 1048   WBC 11.0 (H) 08/06/2019 0111   RBC 4.53 05/30/2022 1048   RBC 4.57 08/06/2019 0111   HGB 12.4 05/30/2022 1048   HCT 40.0 05/30/2022 1048   PLT 378 05/30/2022 1048   MCV 88 05/30/2022 1048   MCH 27.4 05/30/2022 1048   MCH 29.3 08/06/2019 0111   MCHC 31.0 (  L) 05/30/2022 1048   MCHC 31.4 08/06/2019 0111   RDW 15.4 05/30/2022 1048   Iron Studies No results found for: "IRON", "TIBC", "FERRITIN", "IRONPCTSAT" Lipid Panel     Component Value Date/Time   CHOL 111 05/30/2022 1048   TRIG 114 05/30/2022 1048   HDL 35 (L) 05/30/2022 1048   LDLCALC 55 05/30/2022 1048   Hepatic Function Panel     Component Value Date/Time   PROT 6.6 05/30/2022 1048   ALBUMIN 3.9 05/30/2022 1048   AST 19 05/30/2022 1048   ALT 13 05/30/2022 1048   ALKPHOS 87 05/30/2022 1048   BILITOT <0.2  05/30/2022 1048      Component Value Date/Time   TSH 0.790 05/30/2022 1048   Nutritional Lab Results  Component Value Date   VD25OH 34.8 05/30/2022   VD25OH 31.4 11/28/2017   VD25OH 30.5 07/18/2017     Assessment and Plan    Obesity Gained nine pounds in five weeks, indicating worsening obesity. Following category two eating plan 50% of the time and walking 15 minutes five times per week. Suspected decreased metabolism. Recommended a low carbohydrate plan to reset hunger and cravings for 2-4 weeks. Discussed risks of dehydration and importance of adequate hydration. - Initiate low carbohydrate diet for 2-4 weeks - Encourage adherence to diet and exercise plan - Ensure adequate hydration - Follow-up in 4 weeks to assess progress  Uncontrolled Type 2 Diabetes Mellitus Type 2 diabetes remains uncontrolled. Emphasized dietary management and regular blood sugar monitoring. Low carbohydrate diet expected to improve glycemic control. - Initiate low carbohydrate diet for 2-4 weeks - Monitor blood sugar levels regularly - Follow-up in 4 weeks to assess progress  Uncontrolled Hypertension Hypertension is currently uncontrolled with inconsistent blood pressure readings. Does not have a reliable home blood pressure monitor. Recommended obtaining a properly sized home blood pressure cuff. - Obtain a home blood pressure monitor with an extra-large cuff - Measure arm circumference for proper cuff size - Monitor blood pressure at home regularly - Follow-up in 4 weeks to assess progress  Follow-up - Follow-up appointment in 4 weeks.         I have personally spent 35 minutes total time today in preparation, patient care, and documentation for this visit, including the following: review of clinical lab tests; review of medical tests/procedures/services.    She was informed of the importance of frequent follow up visits to maximize her success with intensive lifestyle modifications for her  multiple health conditions.    Quillian Quince, MD

## 2023-04-07 ENCOUNTER — Other Ambulatory Visit: Payer: Self-pay

## 2023-04-07 ENCOUNTER — Other Ambulatory Visit (HOSPITAL_COMMUNITY): Payer: Self-pay

## 2023-04-07 ENCOUNTER — Other Ambulatory Visit: Payer: Self-pay | Admitting: Cardiology

## 2023-04-07 ENCOUNTER — Other Ambulatory Visit: Payer: Self-pay | Admitting: Cardiovascular Disease

## 2023-04-07 DIAGNOSIS — I480 Paroxysmal atrial fibrillation: Secondary | ICD-10-CM

## 2023-04-07 MED ORDER — RIVAROXABAN 20 MG PO TABS
20.0000 mg | ORAL_TABLET | Freq: Every day | ORAL | 1 refills | Status: DC
  Filled 2023-04-07: qty 30, 30d supply, fill #0
  Filled 2023-05-15: qty 30, 30d supply, fill #1
  Filled 2023-07-05: qty 30, 30d supply, fill #2
  Filled 2023-07-15 – 2023-07-29 (×2): qty 30, 30d supply, fill #3
  Filled 2023-08-28: qty 30, 30d supply, fill #4
  Filled 2023-10-11: qty 30, 30d supply, fill #5

## 2023-04-07 MED ORDER — FLECAINIDE ACETATE 100 MG PO TABS
100.0000 mg | ORAL_TABLET | Freq: Two times a day (BID) | ORAL | 0 refills | Status: DC
  Filled 2023-04-07: qty 60, 30d supply, fill #0

## 2023-04-07 MED ORDER — OMEPRAZOLE 20 MG PO CPDR
20.0000 mg | DELAYED_RELEASE_CAPSULE | Freq: Every day | ORAL | 0 refills | Status: DC
  Filled 2023-04-11 – 2023-05-15 (×2): qty 90, 90d supply, fill #0

## 2023-04-07 MED ORDER — HYDRALAZINE HCL 25 MG PO TABS
25.0000 mg | ORAL_TABLET | Freq: Two times a day (BID) | ORAL | 0 refills | Status: DC
  Filled 2023-04-07: qty 180, 90d supply, fill #0

## 2023-04-07 MED ORDER — LEVOTHYROXINE SODIUM 137 MCG PO TABS
137.0000 ug | ORAL_TABLET | Freq: Every morning | ORAL | 3 refills | Status: AC
  Filled 2023-04-07: qty 90, 90d supply, fill #0
  Filled 2023-07-15: qty 90, 90d supply, fill #1
  Filled 2023-10-11: qty 90, 90d supply, fill #2
  Filled 2024-01-28: qty 90, 90d supply, fill #3

## 2023-04-07 NOTE — Telephone Encounter (Signed)
Prescription refill request for Xarelto received.  Indication:afib Last office visit:1/24 Weight:114.8  kg Age:73 Scr:1.27  3/24 CrCl:70.43  ml/min  Prescription refilled

## 2023-04-09 ENCOUNTER — Other Ambulatory Visit (HOSPITAL_COMMUNITY): Payer: Self-pay

## 2023-04-09 MED ORDER — INDOMETHACIN 50 MG PO CAPS
ORAL_CAPSULE | ORAL | 0 refills | Status: DC
  Filled 2023-04-09: qty 20, 7d supply, fill #0

## 2023-04-10 ENCOUNTER — Other Ambulatory Visit (HOSPITAL_COMMUNITY): Payer: Self-pay

## 2023-04-10 DIAGNOSIS — H3562 Retinal hemorrhage, left eye: Secondary | ICD-10-CM | POA: Diagnosis not present

## 2023-04-10 DIAGNOSIS — E113513 Type 2 diabetes mellitus with proliferative diabetic retinopathy with macular edema, bilateral: Secondary | ICD-10-CM | POA: Diagnosis not present

## 2023-04-10 DIAGNOSIS — H3582 Retinal ischemia: Secondary | ICD-10-CM | POA: Diagnosis not present

## 2023-04-10 DIAGNOSIS — H43812 Vitreous degeneration, left eye: Secondary | ICD-10-CM | POA: Diagnosis not present

## 2023-04-11 ENCOUNTER — Other Ambulatory Visit (HOSPITAL_COMMUNITY): Payer: Self-pay

## 2023-04-11 ENCOUNTER — Other Ambulatory Visit: Payer: Self-pay

## 2023-04-11 DIAGNOSIS — G5602 Carpal tunnel syndrome, left upper limb: Secondary | ICD-10-CM | POA: Diagnosis not present

## 2023-04-11 MED ORDER — GABAPENTIN 100 MG PO CAPS
100.0000 mg | ORAL_CAPSULE | Freq: Three times a day (TID) | ORAL | 0 refills | Status: DC
  Filled 2023-04-11: qty 90, 30d supply, fill #0

## 2023-04-11 MED ORDER — DAPAGLIFLOZIN PRO-METFORMIN ER 10-1000 MG PO TB24
ORAL_TABLET | Freq: Every day | ORAL | 3 refills | Status: DC
  Filled 2023-04-11: qty 90, 90d supply, fill #0
  Filled 2023-07-29: qty 90, 90d supply, fill #1
  Filled 2023-11-06: qty 90, 90d supply, fill #2
  Filled 2024-01-28: qty 90, 90d supply, fill #3

## 2023-04-12 ENCOUNTER — Other Ambulatory Visit (HOSPITAL_COMMUNITY): Payer: Self-pay

## 2023-04-17 DIAGNOSIS — E113512 Type 2 diabetes mellitus with proliferative diabetic retinopathy with macular edema, left eye: Secondary | ICD-10-CM | POA: Diagnosis not present

## 2023-04-18 ENCOUNTER — Telehealth (INDEPENDENT_AMBULATORY_CARE_PROVIDER_SITE_OTHER): Payer: Self-pay | Admitting: Family Medicine

## 2023-04-18 ENCOUNTER — Other Ambulatory Visit (HOSPITAL_COMMUNITY): Payer: Self-pay

## 2023-04-25 NOTE — Telephone Encounter (Signed)
done

## 2023-05-01 DIAGNOSIS — H3562 Retinal hemorrhage, left eye: Secondary | ICD-10-CM | POA: Diagnosis not present

## 2023-05-01 DIAGNOSIS — E113512 Type 2 diabetes mellitus with proliferative diabetic retinopathy with macular edema, left eye: Secondary | ICD-10-CM | POA: Diagnosis not present

## 2023-05-08 ENCOUNTER — Other Ambulatory Visit: Payer: Self-pay | Admitting: Cardiovascular Disease

## 2023-05-08 ENCOUNTER — Other Ambulatory Visit: Payer: Self-pay

## 2023-05-08 ENCOUNTER — Other Ambulatory Visit (HOSPITAL_COMMUNITY): Payer: Self-pay

## 2023-05-08 MED ORDER — INDOMETHACIN 50 MG PO CAPS
50.0000 mg | ORAL_CAPSULE | Freq: Every day | ORAL | 0 refills | Status: DC
  Filled 2023-05-08: qty 20, 20d supply, fill #0

## 2023-05-08 MED ORDER — DULOXETINE HCL 60 MG PO CPEP
60.0000 mg | ORAL_CAPSULE | Freq: Every day | ORAL | 0 refills | Status: DC
  Filled 2023-05-08: qty 90, 90d supply, fill #0

## 2023-05-08 MED ORDER — HYDRALAZINE HCL 25 MG PO TABS
25.0000 mg | ORAL_TABLET | Freq: Two times a day (BID) | ORAL | 0 refills | Status: DC
  Filled 2023-05-08 – 2023-07-05 (×7): qty 180, 90d supply, fill #0

## 2023-05-09 ENCOUNTER — Other Ambulatory Visit: Payer: Self-pay

## 2023-05-09 ENCOUNTER — Encounter (INDEPENDENT_AMBULATORY_CARE_PROVIDER_SITE_OTHER): Payer: Self-pay

## 2023-05-09 ENCOUNTER — Other Ambulatory Visit (HOSPITAL_COMMUNITY): Payer: Self-pay

## 2023-05-09 ENCOUNTER — Ambulatory Visit (INDEPENDENT_AMBULATORY_CARE_PROVIDER_SITE_OTHER): Payer: Medicare Other | Admitting: Family Medicine

## 2023-05-09 MED ORDER — FLECAINIDE ACETATE 100 MG PO TABS
100.0000 mg | ORAL_TABLET | Freq: Two times a day (BID) | ORAL | 0 refills | Status: DC
  Filled 2023-05-09: qty 30, 15d supply, fill #0

## 2023-05-11 ENCOUNTER — Other Ambulatory Visit (HOSPITAL_COMMUNITY): Payer: Self-pay

## 2023-05-11 DIAGNOSIS — M5412 Radiculopathy, cervical region: Secondary | ICD-10-CM | POA: Diagnosis not present

## 2023-05-11 DIAGNOSIS — R200 Anesthesia of skin: Secondary | ICD-10-CM | POA: Diagnosis not present

## 2023-05-11 MED ORDER — AREXVY 120 MCG/0.5ML IM SUSR
0.5000 mL | INTRAMUSCULAR | 0 refills | Status: DC
  Filled 2023-05-11: qty 0.5, 1d supply, fill #0

## 2023-05-15 ENCOUNTER — Other Ambulatory Visit (HOSPITAL_COMMUNITY): Payer: Self-pay

## 2023-05-15 ENCOUNTER — Other Ambulatory Visit: Payer: Self-pay

## 2023-05-15 MED ORDER — INSULIN PEN NEEDLE 32G X 4 MM MISC
3 refills | Status: AC
  Filled 2023-05-15: qty 200, 90d supply, fill #0
  Filled 2023-12-05: qty 200, 90d supply, fill #1

## 2023-05-16 ENCOUNTER — Other Ambulatory Visit (HOSPITAL_COMMUNITY): Payer: Self-pay

## 2023-05-22 ENCOUNTER — Encounter (INDEPENDENT_AMBULATORY_CARE_PROVIDER_SITE_OTHER): Payer: Self-pay

## 2023-05-23 ENCOUNTER — Other Ambulatory Visit (HOSPITAL_COMMUNITY): Payer: Self-pay

## 2023-05-23 ENCOUNTER — Other Ambulatory Visit: Payer: Self-pay | Admitting: Cardiovascular Disease

## 2023-05-23 ENCOUNTER — Other Ambulatory Visit: Payer: Self-pay

## 2023-05-23 MED ORDER — INDOMETHACIN 50 MG PO CAPS
ORAL_CAPSULE | ORAL | 0 refills | Status: DC
  Filled 2023-05-23 – 2023-05-24 (×2): qty 20, 7d supply, fill #0

## 2023-05-23 MED ORDER — FLECAINIDE ACETATE 100 MG PO TABS
100.0000 mg | ORAL_TABLET | Freq: Two times a day (BID) | ORAL | 0 refills | Status: DC
  Filled 2023-05-23: qty 15, 8d supply, fill #0

## 2023-05-24 ENCOUNTER — Other Ambulatory Visit (HOSPITAL_COMMUNITY): Payer: Self-pay

## 2023-05-30 ENCOUNTER — Other Ambulatory Visit: Payer: Self-pay | Admitting: Physician Assistant

## 2023-05-31 DIAGNOSIS — E113511 Type 2 diabetes mellitus with proliferative diabetic retinopathy with macular edema, right eye: Secondary | ICD-10-CM | POA: Diagnosis not present

## 2023-06-01 ENCOUNTER — Other Ambulatory Visit: Payer: Self-pay | Admitting: Cardiovascular Disease

## 2023-06-01 ENCOUNTER — Other Ambulatory Visit: Payer: Self-pay

## 2023-06-01 ENCOUNTER — Other Ambulatory Visit (HOSPITAL_COMMUNITY): Payer: Self-pay

## 2023-06-01 DIAGNOSIS — R0609 Other forms of dyspnea: Secondary | ICD-10-CM | POA: Diagnosis not present

## 2023-06-01 DIAGNOSIS — E039 Hypothyroidism, unspecified: Secondary | ICD-10-CM | POA: Diagnosis not present

## 2023-06-01 DIAGNOSIS — M85851 Other specified disorders of bone density and structure, right thigh: Secondary | ICD-10-CM | POA: Diagnosis not present

## 2023-06-01 DIAGNOSIS — Z6841 Body Mass Index (BMI) 40.0 and over, adult: Secondary | ICD-10-CM | POA: Diagnosis not present

## 2023-06-01 DIAGNOSIS — Z794 Long term (current) use of insulin: Secondary | ICD-10-CM | POA: Diagnosis not present

## 2023-06-01 DIAGNOSIS — E7800 Pure hypercholesterolemia, unspecified: Secondary | ICD-10-CM | POA: Diagnosis not present

## 2023-06-01 DIAGNOSIS — E1165 Type 2 diabetes mellitus with hyperglycemia: Secondary | ICD-10-CM | POA: Diagnosis not present

## 2023-06-01 DIAGNOSIS — I10 Essential (primary) hypertension: Secondary | ICD-10-CM | POA: Diagnosis not present

## 2023-06-01 DIAGNOSIS — M109 Gout, unspecified: Secondary | ICD-10-CM | POA: Diagnosis not present

## 2023-06-01 DIAGNOSIS — I480 Paroxysmal atrial fibrillation: Secondary | ICD-10-CM | POA: Diagnosis not present

## 2023-06-01 MED ORDER — SPIRONOLACTONE 25 MG PO TABS
12.5000 mg | ORAL_TABLET | ORAL | 0 refills | Status: DC
  Filled 2023-06-01: qty 7, 28d supply, fill #0

## 2023-06-02 ENCOUNTER — Telehealth: Payer: Self-pay | Admitting: *Deleted

## 2023-06-02 DIAGNOSIS — M5412 Radiculopathy, cervical region: Secondary | ICD-10-CM | POA: Diagnosis not present

## 2023-06-02 NOTE — Telephone Encounter (Signed)
   Pre-operative Risk Assessment    Patient Name: [name]  DOB: 02/28/1951 MRN: [id number]   Date of last office visit: 01/27/23 PREOP TELE; 04/13/22 DR. NISHAN Date of next office visit: NONE   Request for Surgical Clearance    Procedure:   CERVICAL ESI  Date of Surgery:  Clearance TBD  PER FORM (APRIL-MAY 2025)                            Surgeon:  DR. Claria Dice Surgeon's Group or Practice Name:  [facility] Phone number:  [phone] Ladona Mow, CMA Fax number:  [phone]   Type of Clearance Requested:   - Medical  - Pharmacy:  Hold Rivaroxaban (Xarelto) x 2 DAYS PRIOR   Type of Anesthesia:  Not Indicated   Additional requests/questions:    Elpidio Anis   06/02/2023, 2:23 PM

## 2023-06-02 NOTE — Telephone Encounter (Signed)
   Name: [name]  DOB: 09/18/1950  MRN: [id number]  Primary Cardiologist: Charlton Haws, MD  Chart reviewed as part of pre-operative protocol coverage. Because of [name]'s past medical history and time since last visit, she will require a follow-up in-office visit in order to better assess preoperative cardiovascular risk. Pt has not been seen in office since 03/2022.   Pre-op covering staff: - Please schedule appointment and call patient to inform them. If patient already had an upcoming appointment within acceptable timeframe, please add "pre-op clearance" to the appointment notes so provider is aware. - Please contact requesting surgeon's office via preferred method (i.e, phone, fax) to inform them of need for appointment prior to surgery.  This message will also be routed to pharmacy pool for input on holding Xarelto as requested below so that this information is available to the clearing provider at time of patient's appointment.   Joylene Grapes, NP  06/02/2023, 2:56 PM

## 2023-06-02 NOTE — Telephone Encounter (Signed)
 Pt has been scheduled to see Tereso Newcomer, [facility] 06/14/23 pre op clearance appt.

## 2023-06-05 ENCOUNTER — Other Ambulatory Visit: Payer: Self-pay

## 2023-06-05 ENCOUNTER — Ambulatory Visit (INDEPENDENT_AMBULATORY_CARE_PROVIDER_SITE_OTHER): Payer: Medicare Other | Admitting: Family Medicine

## 2023-06-05 ENCOUNTER — Other Ambulatory Visit (HOSPITAL_COMMUNITY): Payer: Self-pay

## 2023-06-05 ENCOUNTER — Other Ambulatory Visit: Payer: Self-pay | Admitting: Cardiovascular Disease

## 2023-06-05 MED ORDER — FLECAINIDE ACETATE 100 MG PO TABS
100.0000 mg | ORAL_TABLET | Freq: Two times a day (BID) | ORAL | 0 refills | Status: DC
  Filled 2023-06-05: qty 30, 15d supply, fill #0

## 2023-06-05 NOTE — Telephone Encounter (Signed)
 Patient with diagnosis of afib on Xarelto for anticoagulation.    Procedure: CERVICAL ESI  Date of procedure: TBD   CHA2DS2-VASc Score = 5   This indicates a 7.2% annual risk of stroke. The patient's score is based upon: CHF History: 1 HTN History: 1 Diabetes History: 1 Stroke History: 0 Vascular Disease History: 0 Age Score: 1 Gender Score: 1      CrCl 45 ml/min Platelet count 378  Per office protocol, patient can hold Xarelto for 3 days prior to procedure.    **This guidance is not considered finalized until pre-operative APP has relayed final recommendations.**

## 2023-06-07 DIAGNOSIS — Z79899 Other long term (current) drug therapy: Secondary | ICD-10-CM | POA: Diagnosis not present

## 2023-06-07 DIAGNOSIS — M109 Gout, unspecified: Secondary | ICD-10-CM | POA: Diagnosis not present

## 2023-06-07 DIAGNOSIS — Z794 Long term (current) use of insulin: Secondary | ICD-10-CM | POA: Diagnosis not present

## 2023-06-07 DIAGNOSIS — E7800 Pure hypercholesterolemia, unspecified: Secondary | ICD-10-CM | POA: Diagnosis not present

## 2023-06-07 DIAGNOSIS — E119 Type 2 diabetes mellitus without complications: Secondary | ICD-10-CM | POA: Diagnosis not present

## 2023-06-07 DIAGNOSIS — E039 Hypothyroidism, unspecified: Secondary | ICD-10-CM | POA: Diagnosis not present

## 2023-06-07 DIAGNOSIS — M85851 Other specified disorders of bone density and structure, right thigh: Secondary | ICD-10-CM | POA: Diagnosis not present

## 2023-06-07 LAB — LAB REPORT - SCANNED
A1c: 7.3
EGFR (Non-African Amer.): 40
TSH: 0.47 (ref 0.41–5.90)

## 2023-06-09 ENCOUNTER — Other Ambulatory Visit (HOSPITAL_COMMUNITY): Payer: Self-pay

## 2023-06-09 ENCOUNTER — Encounter (HOSPITAL_COMMUNITY): Payer: Self-pay | Admitting: Pharmacist

## 2023-06-12 DIAGNOSIS — E113512 Type 2 diabetes mellitus with proliferative diabetic retinopathy with macular edema, left eye: Secondary | ICD-10-CM | POA: Diagnosis not present

## 2023-06-13 ENCOUNTER — Ambulatory Visit (INDEPENDENT_AMBULATORY_CARE_PROVIDER_SITE_OTHER): Admitting: Family Medicine

## 2023-06-13 ENCOUNTER — Encounter (INDEPENDENT_AMBULATORY_CARE_PROVIDER_SITE_OTHER): Payer: Self-pay | Admitting: Family Medicine

## 2023-06-13 VITALS — BP 142/69 | HR 67 | Temp 98.3°F | Ht 60.0 in | Wt 245.0 lb

## 2023-06-13 DIAGNOSIS — E119 Type 2 diabetes mellitus without complications: Secondary | ICD-10-CM

## 2023-06-13 DIAGNOSIS — Z794 Long term (current) use of insulin: Secondary | ICD-10-CM

## 2023-06-13 DIAGNOSIS — E669 Obesity, unspecified: Secondary | ICD-10-CM | POA: Diagnosis not present

## 2023-06-13 DIAGNOSIS — I10 Essential (primary) hypertension: Secondary | ICD-10-CM | POA: Diagnosis not present

## 2023-06-13 DIAGNOSIS — Z6841 Body Mass Index (BMI) 40.0 and over, adult: Secondary | ICD-10-CM

## 2023-06-13 DIAGNOSIS — E11319 Type 2 diabetes mellitus with unspecified diabetic retinopathy without macular edema: Secondary | ICD-10-CM

## 2023-06-13 NOTE — Progress Notes (Signed)
 Office: [phone]  /  Fax: [phone]  WEIGHT SUMMARY AND BIOMETRICS  Anthropometric Measurements Height: 5' (1.524 m) Weight: 245 lb (111.1 kg) BMI (Calculated): 47.85 Weight at Last Visit: 253 lb Weight Lost Since Last Visit: 8 lb Weight Gained Since Last Visit: 0 Starting Weight: 258 lb Total Weight Loss (lbs): 11 lb (4.99 kg)   Body Composition  Body Fat %: 57.3 % Fat Mass (lbs): 140.4 lbs Muscle Mass (lbs): 99.4 lbs Visceral Fat Rating : 23   Other Clinical Data Fasting: No Labs: No Today's Visit #: 13 Starting Date: 05/30/22    Chief Complaint: OBESITY    History of Present Illness   [name] is a 73 year old female who presents for obesity treatment and progress assessment.  She is following a low-carb eating plan, adhering to it about 60% of the time, and has lost eight pounds over the last two months. For exercise, she walks for 30 minutes twice a week.  She has been experiencing issues with her blood sugar levels. Previously, she was on glargine at 70 units twice a day, but this was reduced to 60 units due to low blood sugar episodes at night, where her blood sugar would drop to 40 mg/dL. She wakes up at 2-3 AM with low blood sugar and addresses it by consuming juice.  She has a history of hypertension and is currently taking losartan 50 mg, metoprolol 50 mg, spironolactone 25 mg, and amlodipine 5 mg. Her blood pressure was elevated today at 163/73, and a repeat measurement showed improvement at 142/69.  She recently retired and is adjusting to a new routine. She used to work early hours and is now trying to establish a new schedule. She plans to organize a room in her house and is expecting family to visit in July.          PHYSICAL EXAM:  Blood pressure (!) 142/69, pulse 67, temperature 98.3 F (36.8 C), height 5' (1.524 m), weight 245 lb (111.1 kg), SpO2 95%. Body mass index is 47.85 kg/m.  DIAGNOSTIC DATA REVIEWED:  BMET     Component Value Date/Time   NA 139 05/30/2022 1048   K 4.9 05/30/2022 1048   CL 103 05/30/2022 1048   CO2 17 (L) 05/30/2022 1048   GLUCOSE 186 (H) 05/30/2022 1048   GLUCOSE 226 (H) 08/06/2019 0111   BUN 34 (H) 05/30/2022 1048   CREATININE 1.27 (H) 05/30/2022 1048   CALCIUM 9.2 05/30/2022 1048   GFRNONAA 37 (L) 08/06/2019 0111   GFRAA 43 (L) 08/06/2019 0111   Lab Results  Component Value Date   HGBA1C 7.7 (H) 05/30/2022   HGBA1C 7.9 09/18/2014   Lab Results  Component Value Date   INSULIN 24.3 05/30/2022   Lab Results  Component Value Date   TSH 0.790 05/30/2022   CBC    Component Value Date/Time   WBC 10.0 05/30/2022 1048   WBC 11.0 (H) 08/06/2019 0111   RBC 4.53 05/30/2022 1048   RBC 4.57 08/06/2019 0111   HGB 12.4 05/30/2022 1048   HCT 40.0 05/30/2022 1048   PLT 378 05/30/2022 1048   MCV 88 05/30/2022 1048   MCH 27.4 05/30/2022 1048   MCH 29.3 08/06/2019 0111   MCHC 31.0 (L) 05/30/2022 1048   MCHC 31.4 08/06/2019 0111   RDW 15.4 05/30/2022 1048   Iron Studies No results found for: "IRON", "TIBC", "FERRITIN", "IRONPCTSAT" Lipid Panel     Component Value Date/Time   CHOL 111 05/30/2022 1048  TRIG 114 05/30/2022 1048   HDL 35 (L) 05/30/2022 1048   LDLCALC 55 05/30/2022 1048   Hepatic Function Panel     Component Value Date/Time   PROT 6.6 05/30/2022 1048   ALBUMIN 3.9 05/30/2022 1048   AST 19 05/30/2022 1048   ALT 13 05/30/2022 1048   ALKPHOS 87 05/30/2022 1048   BILITOT <0.2 05/30/2022 1048      Component Value Date/Time   TSH 0.790 05/30/2022 1048   Nutritional Lab Results  Component Value Date   VD25OH 34.8 05/30/2022   VD25OH 31.4 11/28/2017   VD25OH 30.5 07/18/2017     Assessment and Plan    Diabetes Mellitus Experiencing nocturnal hypoglycemia with blood glucose levels dropping to 40 mg/dL. Insulin glargine dosage was recently reduced from 70 units to 60 units twice daily, with further reductions planned if hypoglycemia persists.  The goal is to minimize insulin dosage while maintaining glycemic control. - Adjust insulin dosage as needed based on blood glucose monitoring. - Consider reducing insulin glargine to 50 units if hypoglycemia continues.  Obesity Following a low-carb diet with 60% adherence, resulting in an 8-pound weight loss over two months. Engages in walking for 30 minutes twice weekly. The low-carb diet has contributed to nocturnal hypoglycemia, with blood glucose dropping to 40 mg/dL at night. Plan to continue the diet with adjustments to prevent hypoglycemia. - Continue low-carb diet with a protein snack before bed, consisting of 15-20 grams of protein and approximately 200 calories. - Recommend a dessert made with 1% cottage cheese, sugar-free Jell-O instant pudding mix, and chocolate Fairlife milk (recipe given today) to be consumed within an hour before bed. (150 calories, 15 gm protein) - Adjust insulin dosage as needed based on blood glucose monitoring, Goal to reduce insulin with diet and weight loss - Emphasize maintaining a routine post-retirement to prevent excessive snacking and improve overall well-being.  Hypertension Blood pressure was elevated at 163/73 mmHg initially and improved to 142/69 mmHg on repeat measurement. Currently on losartan, metoprolol, spironolactone, and amlodipine for hypertension management. Home monitoring is encouraged to better assess control. - Encourage home blood pressure monitoring and bring the device to the next visit for calibration and instruction if needed.   Follow up in 4 weeks          She was informed of the importance of frequent follow up visits to maximize her success with intensive lifestyle modifications for her multiple health conditions.    Quillian Quince, MD

## 2023-06-14 ENCOUNTER — Encounter: Payer: Self-pay | Admitting: Physician Assistant

## 2023-06-14 ENCOUNTER — Ambulatory Visit: Attending: Physician Assistant | Admitting: Physician Assistant

## 2023-06-14 VITALS — BP 134/70 | HR 70 | Ht 61.0 in | Wt 248.4 lb

## 2023-06-14 DIAGNOSIS — I5032 Chronic diastolic (congestive) heart failure: Secondary | ICD-10-CM | POA: Diagnosis not present

## 2023-06-14 DIAGNOSIS — Z01810 Encounter for preprocedural cardiovascular examination: Secondary | ICD-10-CM

## 2023-06-14 DIAGNOSIS — I10 Essential (primary) hypertension: Secondary | ICD-10-CM

## 2023-06-14 DIAGNOSIS — I480 Paroxysmal atrial fibrillation: Secondary | ICD-10-CM | POA: Diagnosis not present

## 2023-06-14 NOTE — Progress Notes (Signed)
 Cardiology Office Note:    Date:  06/14/2023  ID:  Alinda Deem, DOB [birth year], MRN [id number] PCP: Thana Ates, MD  [facility] Providers Cardiologist:  Charlton Haws, MD       Patient Profile:      Paroxysmal atrial fibrillation Flecainide Rx TTE 11/17/2020: No exercise-induced arrhythmias Myoview 07/20/2021: EF 57, no ischemia (HFpEF) heart failure with preserved ejection fraction TTE 07/19/2021: EF 55-60, no RWMA, mild LVH, GR 1 DD, normal RVSF, normal PASP, RVSP 29.2, mild BAE, trivial MR Hypertension Hyperlipidemia Diabetes mellitus Obesity OSA        Discussed the use of AI scribe software for clinical note transcription with the patient, who gave verbal consent to proceed.  History of Present Illness [name] is a 73 y.o. female who returns for surgical clearance. She was last seen by Dr. Eden Emms 04/13/2022.  She needs cervical epidural steroid injection with Dr. Regino Schultze.  Our Pharm.D. team reviewed her history and, per protocol, she can hold Xarelto for 3 days prior to her procedure  She is here alone.  She is doing well on her current dose of flecainide.  She has not experienced any episodes of arrhythmia in the past six months. She experiences shortness of breath during exercise, which she attributes to her back pain rather than cardiac issues. Her physical activity is limited to walking a couple of times a week, and she finds it difficult to walk a block or two on level ground without stopping, primarily due to back pain. She can climb stairs slowly and perform household activities like vacuuming and carrying groceries, although she notes that descending stairs is more challenging than ascending. No chest discomfort, pressure, heaviness, syncope, or leg swelling. She recently retired from a Engineering geologist at [employer].. She does not smoke.   ROS-See HPI    Studies Reviewed:   EKG Interpretation Date/Time:  Wednesday June 14 2023 13:52:01 EDT Ventricular  Rate:  70 PR Interval:  204 QRS Duration:  98 QT Interval:  424 QTC Calculation: 457 R Axis:   -5  Text Interpretation: Normal sinus rhythm First degree heart block Cannot rule out Anterior infarct When compared with ECG of 23-Dec-2021 14:25, No significant change was found Confirmed by Tereso Newcomer [id number]) on 06/14/2023 2:07:26 PM    Results    Risk Assessment/Calculations:    CHA2DS2-VASc Score = 5   This indicates a 7.2% annual risk of stroke. The patient's score is based upon: CHF History: 1 HTN History: 1 Diabetes History: 1 Stroke History: 0 Vascular Disease History: 0 Age Score: 1 Gender Score: 1            Physical Exam:   VS:  BP 134/70   Pulse 70   Ht 5\' 1"  (1.549 m)   Wt 248 lb 6.4 oz (112.7 kg)   SpO2 94%   BMI 46.93 kg/m    Wt Readings from Last 3 Encounters:  06/14/23 248 lb 6.4 oz (112.7 kg)  06/13/23 245 lb (111.1 kg)  04/06/23 253 lb (114.8 kg)    Constitutional:      Appearance: Healthy appearance. Not in distress.  Neck:     Vascular: No JVR. JVD normal.  Pulmonary:     Breath sounds: Normal breath sounds. No wheezing. No rales.  Cardiovascular:     Normal rate. Regular rhythm.     Murmurs: There is no murmur.  Edema:    Peripheral edema absent.  Abdominal:     Palpations:  Abdomen is soft.        Assessment and Plan:   Assessment & Plan Preoperative cardiovascular examination Ms. Pavlock's perioperative risk of a major cardiac event is 6.6% according to the Revised Cardiac Risk Index (RCRI) which equates to a high risk for perioperative complications.   However, her functional capacity is fair at 4.3 METs according to the Duke Activity Status Index (DASI). Recommendations: Therefore, According to ACC/AHA guidelines, no further cardiovascular testing needed.  The patient may proceed to surgery at acceptable risk.   Antiplatelet and/or Anticoagulation Recommendations: Xarelto (Rivaroxaban) can be held for 3 days prior to surgery.  Please  resume post op when felt to be safe.   Paroxysmal atrial fibrillation (HCC) She is maintaining sinus rhythm on flecainide therapy. EKG shows stable QRS and QTc intervals. - Continue flecainide 100 mg twice daily - Continue Xarelto 20 mg daily - Continue metoprolol tartrate 50 mg twice daily - Request most recent CBC, BMET from primary care Chronic heart failure with preserved ejection fraction (HCC) Ejection fraction is 55%-60% on echocardiogram May 2023.  NYHA class II, mainly limited by back pain rather than cardiac symptoms. Her volume status is stable. - Continue dapagliflozin in the form of dapagliflozin/metformin 12/998 mg daily - Continue spironolactone 12.5 mg daily Essential hypertension Her blood pressure is well-managed with multiple antihypertensive medications. - Continue amlodipine 5 mg daily - Continue hydralazine 25 mg twice daily - Continue losartan 50 mg daily - Continue metoprolol tartrate 50 mg twice daily - Continue spironolactone 12.5 mg daily      Dispo:  Return in about 1 year (around 06/13/2024) for Routine Follow Up, w/ Dr. Eden Emms.  Signed, Tereso Newcomer, PA-C

## 2023-06-14 NOTE — Assessment & Plan Note (Signed)
 Her blood pressure is well-managed with multiple antihypertensive medications. - Continue amlodipine 5 mg daily - Continue hydralazine 25 mg twice daily - Continue losartan 50 mg daily - Continue metoprolol tartrate 50 mg twice daily - Continue spironolactone 12.5 mg daily

## 2023-06-14 NOTE — Assessment & Plan Note (Signed)
 She is maintaining sinus rhythm on flecainide therapy. EKG shows stable QRS and QTc intervals. - Continue flecainide 100 mg twice daily - Continue Xarelto 20 mg daily - Continue metoprolol tartrate 50 mg twice daily - Request most recent CBC, BMET from primary care

## 2023-06-14 NOTE — Patient Instructions (Signed)
 Medication Instructions:  Your physician recommends that you continue on your current medications as directed. Please refer to the Current Medication list given to you today.  *If you need a refill on your cardiac medications before your next appointment, please call your pharmacy*  Lab Work: None ordered  If you have labs (blood work) drawn today and your tests are completely normal, you will receive your results only by: MyChart Message (if you have MyChart) OR A paper copy in the mail If you have any lab test that is abnormal or we need to change your treatment, we will call you to review the results.  Testing/Procedures: None ordered  Follow-Up: At [facility], you and your health needs are our priority.  As part of our continuing mission to provide you with exceptional heart care, our providers are all part of one team.  This team includes your primary Cardiologist (physician) and Advanced Practice Providers or APPs (Physician Assistants and Nurse Practitioners) who all work together to provide you with the care you need, when you need it.  Your next appointment:   1 year(s)  Provider:   Charlton Haws, MD     We recommend signing up for the patient portal called "MyChart".  Sign up information is provided on this After Visit Summary.  MyChart is used to connect with patients for Virtual Visits (Telemedicine).  Patients are able to view lab/test results, encounter notes, upcoming appointments, etc.  Non-urgent messages can be sent to your provider as well.   To learn more about what you can do with MyChart, go to [email or web address].   Other Instructions       1st Floor: - Lobby - Registration  - Pharmacy  - Lab - Cafe  2nd Floor: - PV Lab - Diagnostic Testing (echo, CT, nuclear med)  3rd Floor: - Vacant  4th Floor: - TCTS (cardiothoracic surgery) - AFib Clinic - Structural Heart Clinic - Vascular Surgery  - Vascular Ultrasound  5th Floor: -  HeartCare Cardiology (general and EP) - Clinical Pharmacy for coumadin, hypertension, lipid, weight-loss medications, and med management appointments    Valet parking services will be available as well.

## 2023-06-15 NOTE — Telephone Encounter (Signed)
 Notes faxed to surgeon. Tereso Newcomer, PA-C    06/15/2023 8:37 AM

## 2023-06-20 DIAGNOSIS — M5412 Radiculopathy, cervical region: Secondary | ICD-10-CM | POA: Diagnosis not present

## 2023-07-05 ENCOUNTER — Other Ambulatory Visit: Payer: Self-pay | Admitting: Cardiovascular Disease

## 2023-07-05 ENCOUNTER — Other Ambulatory Visit (HOSPITAL_COMMUNITY): Payer: Self-pay

## 2023-07-06 ENCOUNTER — Other Ambulatory Visit (HOSPITAL_COMMUNITY): Payer: Self-pay

## 2023-07-06 ENCOUNTER — Other Ambulatory Visit: Payer: Self-pay

## 2023-07-06 MED ORDER — SPIRONOLACTONE 25 MG PO TABS
12.5000 mg | ORAL_TABLET | ORAL | 3 refills | Status: AC
  Filled 2023-07-06: qty 25, 100d supply, fill #0
  Filled 2023-10-11: qty 25, 100d supply, fill #1
  Filled 2024-01-28: qty 25, 100d supply, fill #2

## 2023-07-06 MED ORDER — METOPROLOL TARTRATE 50 MG PO TABS
50.0000 mg | ORAL_TABLET | Freq: Two times a day (BID) | ORAL | 3 refills | Status: AC
  Filled 2023-07-06: qty 180, 90d supply, fill #0
  Filled 2023-09-25: qty 180, 90d supply, fill #1
  Filled 2023-12-29: qty 180, 90d supply, fill #2
  Filled 2024-04-02: qty 180, 90d supply, fill #3

## 2023-07-06 MED ORDER — FLECAINIDE ACETATE 100 MG PO TABS
100.0000 mg | ORAL_TABLET | Freq: Two times a day (BID) | ORAL | 3 refills | Status: AC
  Filled 2023-07-06: qty 180, 90d supply, fill #0
  Filled 2023-09-25: qty 180, 90d supply, fill #1
  Filled 2023-12-29: qty 180, 90d supply, fill #2
  Filled 2024-04-02: qty 180, 90d supply, fill #3

## 2023-07-10 ENCOUNTER — Encounter: Payer: Self-pay | Admitting: Internal Medicine

## 2023-07-11 DIAGNOSIS — M5416 Radiculopathy, lumbar region: Secondary | ICD-10-CM | POA: Diagnosis not present

## 2023-07-14 DIAGNOSIS — M542 Cervicalgia: Secondary | ICD-10-CM | POA: Diagnosis not present

## 2023-07-15 ENCOUNTER — Other Ambulatory Visit (HOSPITAL_COMMUNITY): Payer: Self-pay

## 2023-07-17 ENCOUNTER — Other Ambulatory Visit: Payer: Self-pay

## 2023-07-17 ENCOUNTER — Other Ambulatory Visit (HOSPITAL_COMMUNITY): Payer: Self-pay

## 2023-07-17 MED ORDER — BASAGLAR KWIKPEN 100 UNIT/ML ~~LOC~~ SOPN
60.0000 [IU] | PEN_INJECTOR | Freq: Two times a day (BID) | SUBCUTANEOUS | 4 refills | Status: DC
  Filled 2023-07-17: qty 36, 30d supply, fill #0
  Filled 2023-08-28: qty 36, 30d supply, fill #1
  Filled 2023-09-25: qty 36, 30d supply, fill #2
  Filled 2023-11-06: qty 36, 30d supply, fill #3
  Filled 2023-12-05: qty 36, 30d supply, fill #4
  Filled 2024-01-10: qty 36, 30d supply, fill #5
  Filled 2024-02-09: qty 9, 8d supply, fill #6

## 2023-07-19 ENCOUNTER — Ambulatory Visit (INDEPENDENT_AMBULATORY_CARE_PROVIDER_SITE_OTHER): Admitting: Family Medicine

## 2023-07-19 ENCOUNTER — Encounter (INDEPENDENT_AMBULATORY_CARE_PROVIDER_SITE_OTHER): Payer: Self-pay | Admitting: Family Medicine

## 2023-07-19 VITALS — BP 106/55 | HR 67 | Temp 98.6°F | Ht 60.0 in | Wt 243.0 lb

## 2023-07-19 DIAGNOSIS — I10 Essential (primary) hypertension: Secondary | ICD-10-CM | POA: Diagnosis not present

## 2023-07-19 DIAGNOSIS — Z6841 Body Mass Index (BMI) 40.0 and over, adult: Secondary | ICD-10-CM

## 2023-07-19 DIAGNOSIS — Z794 Long term (current) use of insulin: Secondary | ICD-10-CM | POA: Diagnosis not present

## 2023-07-19 DIAGNOSIS — E669 Obesity, unspecified: Secondary | ICD-10-CM | POA: Diagnosis not present

## 2023-07-19 DIAGNOSIS — E11319 Type 2 diabetes mellitus with unspecified diabetic retinopathy without macular edema: Secondary | ICD-10-CM

## 2023-07-19 DIAGNOSIS — E119 Type 2 diabetes mellitus without complications: Secondary | ICD-10-CM | POA: Diagnosis not present

## 2023-07-19 DIAGNOSIS — W19XXXA Unspecified fall, initial encounter: Secondary | ICD-10-CM | POA: Diagnosis not present

## 2023-07-19 NOTE — Progress Notes (Signed)
 Office: [phone]  /  Fax: [phone]  WEIGHT SUMMARY AND BIOMETRICS  Anthropometric Measurements Height: 5' (1.524 m) Weight: 243 lb (110.2 kg) BMI (Calculated): 47.46 Weight at Last Visit: 245 lb Weight Lost Since Last Visit: 2 lb Weight Gained Since Last Visit: 0 Starting Weight: 258 lb Total Weight Loss (lbs): 13 lb (5.897 kg)   Body Composition  Body Fat %: 56 % Fat Mass (lbs): 136.4 lbs Muscle Mass (lbs): 101.8 lbs Total Body Water  (lbs): 80.4 lbs Visceral Fat Rating : 22   No data recorded  Chief Complaint: OBESITY   History of Present Illness [name] is a 73 year old female with obesity and hypertension who presents for obesity treatment plan assessment and progress evaluation.  She has been adhering to a category two eating plan approximately 60% of the time, resulting in a two-pound weight loss over the past month. She is not currently engaging in any exercise. She monitors her blood glucose levels, noting fluctuations with a recent morning reading of 69 mg/dL before breakfast, 130 mg/dL at lunchtime, and 295 mg/dL in the evening. The following morning, her blood sugar was 120 mg/dL. She is not on a specific diet but practices carbohydrate mindfulness and portion control.  She has a history of hypertension and is managing it through diet, exercise, and weight loss, in addition to her current medications. Her antihypertensive regimen includes amlodipine  5 mg, losartan  50 mg, metoprolol  50 mg twice daily, and spironolactone  12.5 mg every other day.  Approximately a week and a half ago, she experienced a fall at home while moving an object with her foot, leading to her foot slipping out of her shoe and causing her to fall forward. She initially hit her knees, resulting in significant bruising, and subsequently hit her head on a bedside table. Her sister and brother-in-law were present at the time. She is on anticoagulant therapy, which she was aware of during  the incident. Initially, there was no bruising, but a lump formed, and bruising developed later. She did not seek hospital care but managed the wound with hydrocolloid bandages. She denies any current pain from the fall, except for soreness at the site of the head injury. No nausea or symptoms of concussion were reported following the fall. No LOC  She reports changes in her sleep pattern, waking at 3 AM to use the bathroom and then sleeping until 10 AM, which is later than her usual routine. She is staying up later and eating later, resulting in slightly higher morning blood sugar levels, which she finds acceptable as they were previously too low.  She is retired and reports being much happier and less stressed since retirement. She is organizing and cleaning out her home at her own pace. Her granddaughter [name] is taking care of her mother, who had brain surgery, and her autistic brother.      PHYSICAL EXAM:  Blood pressure (!) 106/55, pulse 67, temperature 98.6 F (37 C), height 5' (1.524 m), weight 243 lb (110.2 kg), SpO2 94%. Body mass index is 47.46 kg/m.  DIAGNOSTIC DATA REVIEWED:  BMET    Component Value Date/Time   NA 139 05/30/2022 1048   K 4.9 05/30/2022 1048   CL 103 05/30/2022 1048   CO2 17 (L) 05/30/2022 1048   GLUCOSE 186 (H) 05/30/2022 1048   GLUCOSE 226 (H) 08/06/2019 0111   BUN 34 (H) 05/30/2022 1048   CREATININE 1.27 (H) 05/30/2022 1048   CALCIUM  9.2 05/30/2022 1048   GFRNONAA  40 06/07/2023 1456   GFRAA 43 (L) 08/06/2019 0111   Lab Results  Component Value Date   HGBA1C 7.7 (H) 05/30/2022   HGBA1C 7.9 09/18/2014   Lab Results  Component Value Date   INSULIN  24.3 05/30/2022   Lab Results  Component Value Date   TSH 0.47 06/07/2023   CBC    Component Value Date/Time   WBC 10.0 05/30/2022 1048   WBC 11.0 (H) 08/06/2019 0111   RBC 4.53 05/30/2022 1048   RBC 4.57 08/06/2019 0111   HGB 12.4 05/30/2022 1048   HCT 40.0 05/30/2022 1048   PLT 378  05/30/2022 1048   MCV 88 05/30/2022 1048   MCH 27.4 05/30/2022 1048   MCH 29.3 08/06/2019 0111   MCHC 31.0 (L) 05/30/2022 1048   MCHC 31.4 08/06/2019 0111   RDW 15.4 05/30/2022 1048   Iron Studies No results found for: "IRON", "TIBC", "FERRITIN", "IRONPCTSAT" Lipid Panel     Component Value Date/Time   CHOL 111 05/30/2022 1048   TRIG 114 05/30/2022 1048   HDL 35 (L) 05/30/2022 1048   LDLCALC 55 05/30/2022 1048   Hepatic Function Panel     Component Value Date/Time   PROT 6.6 05/30/2022 1048   ALBUMIN 3.9 05/30/2022 1048   AST 19 05/30/2022 1048   ALT 13 05/30/2022 1048   ALKPHOS 87 05/30/2022 1048   BILITOT <0.2 05/30/2022 1048      Component Value Date/Time   TSH 0.47 06/07/2023 1456   TSH 0.790 05/30/2022 1048   Nutritional Lab Results  Component Value Date   VD25OH 34.8 05/30/2022   VD25OH 31.4 11/28/2017   VD25OH 30.5 07/18/2017     Assessment and Plan Assessment & Plan Fall with head injury Sustained a fall approximately 1.5 weeks ago, resulting in bruising and soreness on the head and subsequent bruising under both eyes. No LOC, No immediate medical attention was sought. There is no current symptoms of concern such as nausea or concussion. Bruising is resolving. She is aware of the signs of a potential subdural bleed and will seek emergency care if symptoms develop.  Diabetes mellitus Blood glucose levels are variable, with morning readings as low as 69 mg/dL and evening readings up to 190 mg/dL. A1c is 7.1%. Current management includes dietary modifications and insulin  therapy. There is potential to reduce insulin  dosage as weight loss continues and glucose levels stabilize. She is using a glucose monitor to track her levels and is aware of dietary impacts on her glucose.  Hypertension Blood pressure is well-controlled at 106/55 mmHg. Current medications include amlodipine , losartan , metoprolol , and spironolactone . -Continue working on diet and exercise nad  will watch for signs of hypotention  Obesity Weight loss of 2 pounds in the last month. Following a category two eating plan 60% of the time. Not currently exercising. Discussion about dietary habits, including portion control and smart choices, with a focus on protein intake. Encouragement to continue weight loss efforts to aid in the management of hypertension and diabetes. She is considering returning to a structured eating plan if needed.     I have personally spent 44 minutes total time today in preparation, patient care, and documentation for this visit, including the following: review of clinical lab tests; review of medical history, customized nutritional counseling and behavior modification strategies   She was informed of the importance of frequent follow up visits to maximize her success with intensive lifestyle modifications for her multiple health conditions.    Jasmine Mesi, MD

## 2023-07-26 DIAGNOSIS — E113511 Type 2 diabetes mellitus with proliferative diabetic retinopathy with macular edema, right eye: Secondary | ICD-10-CM | POA: Diagnosis not present

## 2023-07-28 ENCOUNTER — Other Ambulatory Visit (HOSPITAL_COMMUNITY): Payer: Self-pay

## 2023-07-28 DIAGNOSIS — M5412 Radiculopathy, cervical region: Secondary | ICD-10-CM | POA: Diagnosis not present

## 2023-07-28 MED ORDER — PREGABALIN 50 MG PO CAPS
50.0000 mg | ORAL_CAPSULE | Freq: Two times a day (BID) | ORAL | 2 refills | Status: DC
  Filled 2023-07-28: qty 60, 30d supply, fill #0
  Filled 2023-08-28: qty 60, 30d supply, fill #1

## 2023-07-29 ENCOUNTER — Other Ambulatory Visit (HOSPITAL_COMMUNITY): Payer: Self-pay

## 2023-07-31 ENCOUNTER — Other Ambulatory Visit (HOSPITAL_COMMUNITY): Payer: Self-pay

## 2023-07-31 ENCOUNTER — Other Ambulatory Visit: Payer: Self-pay

## 2023-07-31 MED ORDER — FREESTYLE LIBRE 3 SENSOR MISC
4 refills | Status: AC
Start: 2023-07-31 — End: ?
  Filled 2023-07-31: qty 6, 84d supply, fill #0
  Filled 2023-10-10: qty 6, 84d supply, fill #1
  Filled 2023-12-29: qty 6, 84d supply, fill #2

## 2023-08-02 DIAGNOSIS — M542 Cervicalgia: Secondary | ICD-10-CM | POA: Diagnosis not present

## 2023-08-02 DIAGNOSIS — M79602 Pain in left arm: Secondary | ICD-10-CM | POA: Diagnosis not present

## 2023-08-03 DIAGNOSIS — M542 Cervicalgia: Secondary | ICD-10-CM | POA: Diagnosis not present

## 2023-08-03 DIAGNOSIS — M79602 Pain in left arm: Secondary | ICD-10-CM | POA: Diagnosis not present

## 2023-08-08 ENCOUNTER — Other Ambulatory Visit: Payer: Self-pay

## 2023-08-09 DIAGNOSIS — E113512 Type 2 diabetes mellitus with proliferative diabetic retinopathy with macular edema, left eye: Secondary | ICD-10-CM | POA: Diagnosis not present

## 2023-08-10 DIAGNOSIS — M542 Cervicalgia: Secondary | ICD-10-CM | POA: Diagnosis not present

## 2023-08-10 DIAGNOSIS — M79602 Pain in left arm: Secondary | ICD-10-CM | POA: Diagnosis not present

## 2023-08-11 DIAGNOSIS — M79602 Pain in left arm: Secondary | ICD-10-CM | POA: Diagnosis not present

## 2023-08-11 DIAGNOSIS — M542 Cervicalgia: Secondary | ICD-10-CM | POA: Diagnosis not present

## 2023-08-13 ENCOUNTER — Other Ambulatory Visit (HOSPITAL_COMMUNITY): Payer: Self-pay

## 2023-08-14 ENCOUNTER — Other Ambulatory Visit (HOSPITAL_COMMUNITY): Payer: Self-pay

## 2023-08-14 MED ORDER — OMEPRAZOLE 20 MG PO CPDR
20.0000 mg | DELAYED_RELEASE_CAPSULE | Freq: Every day | ORAL | 3 refills | Status: AC
  Filled 2023-08-14: qty 90, 90d supply, fill #0
  Filled 2023-11-17: qty 90, 90d supply, fill #1
  Filled 2024-02-14: qty 90, 90d supply, fill #2

## 2023-08-14 MED ORDER — DULOXETINE HCL 60 MG PO CPEP
ORAL_CAPSULE | ORAL | 3 refills | Status: AC
  Filled 2023-08-14: qty 90, 90d supply, fill #0
  Filled 2023-11-17: qty 90, 90d supply, fill #1
  Filled 2024-02-14: qty 90, 90d supply, fill #2

## 2023-08-15 DIAGNOSIS — M542 Cervicalgia: Secondary | ICD-10-CM | POA: Diagnosis not present

## 2023-08-15 DIAGNOSIS — M79602 Pain in left arm: Secondary | ICD-10-CM | POA: Diagnosis not present

## 2023-08-17 DIAGNOSIS — M542 Cervicalgia: Secondary | ICD-10-CM | POA: Diagnosis not present

## 2023-08-17 DIAGNOSIS — M79602 Pain in left arm: Secondary | ICD-10-CM | POA: Diagnosis not present

## 2023-08-21 DIAGNOSIS — M79602 Pain in left arm: Secondary | ICD-10-CM | POA: Diagnosis not present

## 2023-08-21 DIAGNOSIS — M542 Cervicalgia: Secondary | ICD-10-CM | POA: Diagnosis not present

## 2023-08-23 DIAGNOSIS — M79602 Pain in left arm: Secondary | ICD-10-CM | POA: Diagnosis not present

## 2023-08-23 DIAGNOSIS — M542 Cervicalgia: Secondary | ICD-10-CM | POA: Diagnosis not present

## 2023-08-28 ENCOUNTER — Other Ambulatory Visit: Payer: Self-pay

## 2023-09-01 ENCOUNTER — Other Ambulatory Visit (HOSPITAL_COMMUNITY): Payer: Self-pay

## 2023-09-06 ENCOUNTER — Encounter (INDEPENDENT_AMBULATORY_CARE_PROVIDER_SITE_OTHER): Payer: Self-pay | Admitting: Family Medicine

## 2023-09-06 ENCOUNTER — Ambulatory Visit (INDEPENDENT_AMBULATORY_CARE_PROVIDER_SITE_OTHER): Admitting: Family Medicine

## 2023-09-06 VITALS — BP 131/63 | HR 59 | Temp 98.3°F | Ht 60.0 in | Wt 250.0 lb

## 2023-09-06 DIAGNOSIS — M5412 Radiculopathy, cervical region: Secondary | ICD-10-CM

## 2023-09-06 DIAGNOSIS — E119 Type 2 diabetes mellitus without complications: Secondary | ICD-10-CM

## 2023-09-06 DIAGNOSIS — Z6841 Body Mass Index (BMI) 40.0 and over, adult: Secondary | ICD-10-CM

## 2023-09-06 DIAGNOSIS — Z7984 Long term (current) use of oral hypoglycemic drugs: Secondary | ICD-10-CM | POA: Diagnosis not present

## 2023-09-06 DIAGNOSIS — G8929 Other chronic pain: Secondary | ICD-10-CM

## 2023-09-06 DIAGNOSIS — E669 Obesity, unspecified: Secondary | ICD-10-CM | POA: Diagnosis not present

## 2023-09-06 DIAGNOSIS — Z7985 Long-term (current) use of injectable non-insulin antidiabetic drugs: Secondary | ICD-10-CM | POA: Diagnosis not present

## 2023-09-06 DIAGNOSIS — M545 Low back pain, unspecified: Secondary | ICD-10-CM | POA: Diagnosis not present

## 2023-09-06 DIAGNOSIS — Z794 Long term (current) use of insulin: Secondary | ICD-10-CM | POA: Diagnosis not present

## 2023-09-06 DIAGNOSIS — E11319 Type 2 diabetes mellitus with unspecified diabetic retinopathy without macular edema: Secondary | ICD-10-CM

## 2023-09-06 NOTE — Progress Notes (Signed)
 Office: [phone]  /  Fax: [phone]  WEIGHT SUMMARY AND BIOMETRICS  Anthropometric Measurements Height: 5' (1.524 m) Weight: 250 lb (113.4 kg) BMI (Calculated): 48.82 Weight at Last Visit: 243 lb Weight Lost Since Last Visit: 0 Weight Gained Since Last Visit: 7 lb Starting Weight: 258 lb Total Weight Loss (lbs): 8 lb (3.629 kg)   Body Composition  Body Fat %: 58.3 % Fat Mass (lbs): 146.2 lbs Muscle Mass (lbs): 99.2 lbs Visceral Fat Rating : 24   Other Clinical Data Fasting: No Labs: No Today's Visit #: 15    Chief Complaint: OBESITY   Discussed the use of AI scribe software for clinical note transcription with the patient, who gave verbal consent to proceed.  History of Present Illness [name] is a 73 year old female with obesity and type 2 diabetes who presents for obesity treatment plan assessment and progress evaluation.  She is adhering to the prescribed category two eating plan about fifty percent of the time and has gained seven pounds in the last seven weeks. She is currently exercising but experiences difficulty due to pain issues.  She has a history of type 2 diabetes and is on insulin , metformin , and Trulicity . Her blood sugars are generally stable, with occasional fluctuations attributed to pain. A recent change in her insulin  from Symgly to Basaglar  was made due to insurance requirements, which has been resolved.  She experiences ongoing pain, including lower back pain, neck pain, and left arm pain with tingling. An injection in her neck about a month and a half ago did not alleviate her symptoms. She completed physical therapy without improvement and reports worsening symptoms over the past two weeks, affecting her sleep and daily activities.  She experiences numbness in her fingers, impacting her ability to hold objects. She describes her pain as 'like having a toothache' and is unable to sleep through the night due to pain. She has difficulty  walking due to lower back pain, which causes her to hunch over.  She has a history of hand surgery a couple of years ago, which initially improved but later worsened. She has consulted multiple specialists and undergone various treatments, including injections and physical therapy, without significant relief. An MRI conducted a couple of months ago showed worsening neck issues compared to ten years ago.  She has a new cat, which occasionally disrupts her sleep, but she finds the cat's behavior amusing. She expresses frustration with her current health situation and the impact on her mobility and quality of life.      PHYSICAL EXAM:  Blood pressure 131/63, pulse (!) 59, temperature 98.3 F (36.8 C), height 5' (1.524 m), weight 250 lb (113.4 kg), SpO2 96%. Body mass index is 48.82 kg/m.  DIAGNOSTIC DATA REVIEWED:  BMET    Component Value Date/Time   NA 139 05/30/2022 1048   K 4.9 05/30/2022 1048   CL 103 05/30/2022 1048   CO2 17 (L) 05/30/2022 1048   GLUCOSE 186 (H) 05/30/2022 1048   GLUCOSE 226 (H) 08/06/2019 0111   BUN 34 (H) 05/30/2022 1048   CREATININE 1.27 (H) 05/30/2022 1048   CALCIUM  9.2 05/30/2022 1048   GFRNONAA 40 06/07/2023 1456   GFRAA 43 (L) 08/06/2019 0111   Lab Results  Component Value Date   HGBA1C 7.7 (H) 05/30/2022   HGBA1C 7.9 09/18/2014   Lab Results  Component Value Date   INSULIN  24.3 05/30/2022   Lab Results  Component Value Date   TSH 0.47 06/07/2023  CBC    Component Value Date/Time   WBC 10.0 05/30/2022 1048   WBC 11.0 (H) 08/06/2019 0111   RBC 4.53 05/30/2022 1048   RBC 4.57 08/06/2019 0111   HGB 12.4 05/30/2022 1048   HCT 40.0 05/30/2022 1048   PLT 378 05/30/2022 1048   MCV 88 05/30/2022 1048   MCH 27.4 05/30/2022 1048   MCH 29.3 08/06/2019 0111   MCHC 31.0 (L) 05/30/2022 1048   MCHC 31.4 08/06/2019 0111   RDW 15.4 05/30/2022 1048   Iron Studies No results found for: IRON, TIBC, FERRITIN, IRONPCTSAT Lipid Panel      Component Value Date/Time   CHOL 111 05/30/2022 1048   TRIG 114 05/30/2022 1048   HDL 35 (L) 05/30/2022 1048   LDLCALC 55 05/30/2022 1048   Hepatic Function Panel     Component Value Date/Time   PROT 6.6 05/30/2022 1048   ALBUMIN 3.9 05/30/2022 1048   AST 19 05/30/2022 1048   ALT 13 05/30/2022 1048   ALKPHOS 87 05/30/2022 1048   BILITOT <0.2 05/30/2022 1048      Component Value Date/Time   TSH 0.47 06/07/2023 1456   TSH 0.790 05/30/2022 1048   Nutritional Lab Results  Component Value Date   VD25OH 34.8 05/30/2022   VD25OH 31.4 11/28/2017   VD25OH 30.5 07/18/2017     Assessment and Plan Assessment & Plan Cervical Radiculopathy Neck pain radiating to the left arm with tingling and numbness has worsened over the past two weeks. Previous neck injection and physical therapy were ineffective. She is considering surgical options but is hesitant. Nerve ablation, which involves burning the nerves to prevent pain signal transmission, was discussed as a potential option. Further evaluation is needed to determine the cause of nerve compression and the appropriateness of this treatment. - Follow up with Doctor Cyrena on Friday to discuss surgical options and potential nerve ablation or other treatment options - Continue to work on PT exercises and ergonomics to reduce pressure on the nerve  Chronic Low Back Pain Chronic low back pain affects her mobility and ability to exercise. Leaning forward provides some relief. Previous treatments were ineffective, and she seeks further management options. Maintaining physical activity is crucial to prevent deconditioning. - Encourage maintaining physical activity to prevent deconditioning  Obesity Following the category two eating plan about fifty percent of the time and currently exercising. Despite efforts, she has gained seven pounds in the last seven weeks, partially attributed to fluid retention due to hot and humid weather. The treatment plan  addresses obesity and type 2 diabetes management. A temporary hiatus from aggressive weight loss efforts is considered to focus on managing pain and maintaining mobility. - Continue category two eating plan - Encourage regular exercise, even if limited to short durations - Reassess weight management plan in two months - Consider temporary hiatus from aggressive weight loss efforts to focus on pain management  Type 2 Diabetes Mellitus Type 2 diabetes managed with insulin , metformin , and Trulicity . Blood sugars are generally well-controlled, although pain may affect levels. Insurance coverage issue for insulin  has been resolved, and she is now on Basaglar  instead of Symgly. - Continue current diabetes medications including Basaglar , metformin , and Trulicity   I have personally spent 38 minutes total time today in preparation, patient care, and documentation for this visit, including the following: review of clinical lab tests; review of medical tests/procedures/services, nutritional counseling     She was informed of the importance of frequent follow up visits to maximize her success with  intensive lifestyle modifications for her multiple health conditions.    Louann Penton, MD

## 2023-09-08 DIAGNOSIS — G4733 Obstructive sleep apnea (adult) (pediatric): Secondary | ICD-10-CM | POA: Diagnosis not present

## 2023-09-08 DIAGNOSIS — M5412 Radiculopathy, cervical region: Secondary | ICD-10-CM | POA: Diagnosis not present

## 2023-09-11 ENCOUNTER — Other Ambulatory Visit (HOSPITAL_COMMUNITY): Payer: Self-pay

## 2023-09-11 DIAGNOSIS — M5412 Radiculopathy, cervical region: Secondary | ICD-10-CM | POA: Diagnosis not present

## 2023-09-11 MED ORDER — PREGABALIN 150 MG PO CAPS
150.0000 mg | ORAL_CAPSULE | Freq: Two times a day (BID) | ORAL | 3 refills | Status: DC | PRN
  Filled 2023-09-11: qty 60, 30d supply, fill #0
  Filled 2023-10-04 – 2023-10-05 (×2): qty 60, 30d supply, fill #1
  Filled 2023-11-06: qty 60, 30d supply, fill #2
  Filled 2023-12-05: qty 60, 30d supply, fill #3

## 2023-09-11 MED ORDER — CYCLOBENZAPRINE HCL 5 MG PO TABS
5.0000 mg | ORAL_TABLET | Freq: Every evening | ORAL | 0 refills | Status: DC | PRN
  Filled 2023-09-11: qty 60, 30d supply, fill #0

## 2023-09-12 ENCOUNTER — Other Ambulatory Visit (HOSPITAL_COMMUNITY): Payer: Self-pay

## 2023-09-13 ENCOUNTER — Other Ambulatory Visit (HOSPITAL_COMMUNITY): Payer: Self-pay

## 2023-09-19 ENCOUNTER — Telehealth: Payer: Self-pay | Admitting: *Deleted

## 2023-09-19 NOTE — Telephone Encounter (Signed)
   Pre-operative Risk Assessment    Patient Name: [name]  DOB: [birth year] MRN: [id number]   Date of last office visit: 06/14/23 [facility] Date of next office visit: NONE   Request for Surgical Clearance    Procedure:  LEFT & RIGHT C7-T1 IL ESI  Date of Surgery:  Clearance TBD                                Surgeon:  DR. DARRYLE RUNNING Surgeon's Group or Practice Name:  [facility] Phone number:  [phone] [phone] Fax number:  [phone] ATTN: INJECTION SCHEDULING   Type of Clearance Requested:   - Pharmacy:  Hold Rivaroxaban  (Xarelto ) x 7 DAYS PRIOR (FORM STATES ANTICOAGULATION CLEARANCE AT THE TOP RIGHT OF THEIR FORM)   Type of Anesthesia:  Not Indicated   Additional requests/questions:    [name]   09/19/2023, 12:00 PM

## 2023-09-20 NOTE — Telephone Encounter (Signed)
 Pharmacy please advise on holding Xarelto  for 7 days prior to LEFT & RIGHT C7-T1 IL ESI  scheduled for TBD. Thank you.

## 2023-09-21 NOTE — Telephone Encounter (Signed)
 Requesting office sent duplicate request. Our preop team has request is reviewing with pharm-D for blood thinner hold recommendations.   Once the pt has been cleared our office will fax notes to requesting office.   I will update the requesting we have the request in process at this time.

## 2023-09-22 NOTE — Telephone Encounter (Signed)
 Patient with diagnosis of afib on Xarelto  for anticoagulation.    Procedure: LEFT & RIGHT C7-T1 IL ESI  Date of procedure: TBD   CHA2DS2-VASc Score = 5   This indicates a 7.2% annual risk of stroke. The patient's score is based upon: CHF History: 1 HTN History: 1 Diabetes History: 1 Stroke History: 0 Vascular Disease History: 0 Age Score: 1 Gender Score: 1      CrCl 41 ml/min Platelet count 345  Patient has not had an Afib/aflutter ablation within the last 3 months or DCCV within the last 30 days  Per office protocol, patient can hold Xarelto  for 3 days prior to procedure.    **This guidance is not considered finalized until pre-operative APP has relayed final recommendations.**

## 2023-09-25 NOTE — Telephone Encounter (Signed)
 Will route to Dr. Nishan as per surgeon's office request.  Dr. Delford: Orthopedics has requested 7 day hold for LEFT & RIGHT C7-T1 IL ESI. Pharmacy team recommended 3 day hold. Our office reached back out to surgical team to clarify if they were OK with 3 day hold per usual protocol instead of 7 day hold as requested and they are requesting your specific input for safe hold time. - Please route response to P [facility] PREOP (the pre-op pool). Thank you.

## 2023-09-25 NOTE — Telephone Encounter (Addendum)
 Cathy from [employer] called back about Xarelto  hold. See previous notes from preop APP Raphael Bring, PAC in question of hold time for Xarelto  being requested by requesting office.   I did review with Donny the recommendations from the preop APP and the Pharm-d.   Cathy asked if our team will check with Dr. Nishan as well to get his input on a safe hold time for Xarelto  for injection to be done. Once Dr. Nishan provides input as well, Donny will address with  Dr. Ibazebo to come to an agreeable hold time.  I will forward this back to the preop team to address with Dr. Nishan as well.

## 2023-09-25 NOTE — Telephone Encounter (Signed)
 Helping in preop today. Callback, can you check with requesting practice re: Xarelto  hold? Our office would typically recommend 3 day hold but they are asking for 7 day hold. If 7 day hold is needed, we will need to clear with MD.

## 2023-09-25 NOTE — Telephone Encounter (Signed)
   Patient Name: [name]  DOB: 1950/03/31 MRN: [id number]  Primary Cardiologist: Maude Emmer, MD  Chart reviewed as part of pre-operative protocol coverage. As below, surgical team requested input from Dr. Emmer on holding of anticoagulation. Per Dr. Emmer, Guidelines not specific to neurosurgery. Group them with high risk bleeding Rec 72 hour hold for this She has some renal impairment Cr1.37 which slows clearance of eliquis She is in NSR so low embolic risk off DOAC. I would hold 5 days before as a good compromise between 3 and 7 days.  Will route this bundled recommendation to requesting provider via Epic fax function. Please call with questions.  [name] N Jeannette Maddy, PA-C 09/25/2023, 4:49 PM

## 2023-09-26 ENCOUNTER — Other Ambulatory Visit (HOSPITAL_COMMUNITY): Payer: Self-pay

## 2023-09-26 NOTE — Telephone Encounter (Signed)
Patient returned Pre-op call. 

## 2023-09-26 NOTE — Telephone Encounter (Signed)
 I tried to call the pt to advise of blood thinner hold recommendations, though no answer.   I will fax notes to requesting office to see notes from Dr. Delford in regard to blood thinner.

## 2023-09-27 ENCOUNTER — Other Ambulatory Visit (HOSPITAL_COMMUNITY): Payer: Self-pay

## 2023-09-27 DIAGNOSIS — E113511 Type 2 diabetes mellitus with proliferative diabetic retinopathy with macular edema, right eye: Secondary | ICD-10-CM | POA: Diagnosis not present

## 2023-09-27 DIAGNOSIS — H353121 Nonexudative age-related macular degeneration, left eye, early dry stage: Secondary | ICD-10-CM | POA: Diagnosis not present

## 2023-09-27 DIAGNOSIS — H53412 Scotoma involving central area, left eye: Secondary | ICD-10-CM | POA: Diagnosis not present

## 2023-09-27 DIAGNOSIS — H35362 Drusen (degenerative) of macula, left eye: Secondary | ICD-10-CM | POA: Diagnosis not present

## 2023-09-28 ENCOUNTER — Other Ambulatory Visit (HOSPITAL_COMMUNITY): Payer: Self-pay

## 2023-09-28 ENCOUNTER — Other Ambulatory Visit: Payer: Self-pay

## 2023-09-30 ENCOUNTER — Other Ambulatory Visit (HOSPITAL_COMMUNITY): Payer: Self-pay

## 2023-10-02 ENCOUNTER — Other Ambulatory Visit (HOSPITAL_COMMUNITY): Payer: Self-pay

## 2023-10-03 ENCOUNTER — Other Ambulatory Visit (HOSPITAL_COMMUNITY): Payer: Self-pay

## 2023-10-03 ENCOUNTER — Ambulatory Visit (INDEPENDENT_AMBULATORY_CARE_PROVIDER_SITE_OTHER): Admitting: Family Medicine

## 2023-10-04 ENCOUNTER — Other Ambulatory Visit (HOSPITAL_COMMUNITY): Payer: Self-pay

## 2023-10-04 DIAGNOSIS — E113512 Type 2 diabetes mellitus with proliferative diabetic retinopathy with macular edema, left eye: Secondary | ICD-10-CM | POA: Diagnosis not present

## 2023-10-05 ENCOUNTER — Other Ambulatory Visit: Payer: Self-pay

## 2023-10-05 ENCOUNTER — Other Ambulatory Visit (HOSPITAL_COMMUNITY): Payer: Self-pay

## 2023-10-10 ENCOUNTER — Other Ambulatory Visit (HOSPITAL_COMMUNITY): Payer: Self-pay

## 2023-10-11 ENCOUNTER — Other Ambulatory Visit (HOSPITAL_COMMUNITY): Payer: Self-pay

## 2023-10-11 ENCOUNTER — Other Ambulatory Visit: Payer: Self-pay

## 2023-10-11 MED ORDER — HYDRALAZINE HCL 25 MG PO TABS
25.0000 mg | ORAL_TABLET | Freq: Two times a day (BID) | ORAL | 0 refills | Status: DC
  Filled 2023-10-11: qty 180, 90d supply, fill #0

## 2023-10-12 ENCOUNTER — Other Ambulatory Visit (HOSPITAL_COMMUNITY): Payer: Self-pay

## 2023-10-12 DIAGNOSIS — M5412 Radiculopathy, cervical region: Secondary | ICD-10-CM | POA: Diagnosis not present

## 2023-10-13 ENCOUNTER — Other Ambulatory Visit (HOSPITAL_COMMUNITY): Payer: Self-pay

## 2023-10-16 ENCOUNTER — Other Ambulatory Visit (HOSPITAL_COMMUNITY): Payer: Self-pay

## 2023-10-16 MED ORDER — TRULICITY 4.5 MG/0.5ML ~~LOC~~ SOAJ
4.5000 mg | SUBCUTANEOUS | 4 refills | Status: AC
  Filled 2023-10-16 – 2023-12-29 (×4): qty 6, 84d supply, fill #0
  Filled 2024-04-02: qty 6, 84d supply, fill #1

## 2023-10-30 DIAGNOSIS — M5412 Radiculopathy, cervical region: Secondary | ICD-10-CM | POA: Diagnosis not present

## 2023-11-02 ENCOUNTER — Encounter (INDEPENDENT_AMBULATORY_CARE_PROVIDER_SITE_OTHER): Payer: Self-pay | Admitting: Family Medicine

## 2023-11-02 ENCOUNTER — Ambulatory Visit (INDEPENDENT_AMBULATORY_CARE_PROVIDER_SITE_OTHER): Admitting: Family Medicine

## 2023-11-02 VITALS — BP 122/62 | HR 58 | Temp 97.8°F | Ht 60.0 in | Wt 259.0 lb

## 2023-11-02 DIAGNOSIS — E119 Type 2 diabetes mellitus without complications: Secondary | ICD-10-CM

## 2023-11-02 DIAGNOSIS — Z7984 Long term (current) use of oral hypoglycemic drugs: Secondary | ICD-10-CM | POA: Diagnosis not present

## 2023-11-02 DIAGNOSIS — Z6841 Body Mass Index (BMI) 40.0 and over, adult: Secondary | ICD-10-CM

## 2023-11-02 DIAGNOSIS — E669 Obesity, unspecified: Secondary | ICD-10-CM | POA: Diagnosis not present

## 2023-11-02 DIAGNOSIS — Z7985 Long-term (current) use of injectable non-insulin antidiabetic drugs: Secondary | ICD-10-CM | POA: Diagnosis not present

## 2023-11-02 NOTE — Progress Notes (Signed)
 Office: [phone]  /  Fax: [phone]  WEIGHT SUMMARY AND BIOMETRICS  Anthropometric Measurements Height: 5' (1.524 m) Weight: 259 lb (117.5 kg) BMI (Calculated): 50.58 Weight at Last Visit: 250 lb Weight Lost Since Last Visit: 0 Weight Gained Since Last Visit: 9 lb Starting Weight: 258 lb Total Weight Loss (lbs): 0 lb (0 kg) Peak Weight: 259 lb   Body Composition  Body Fat %: 61.3 % Fat Mass (lbs): 159.2 lbs Muscle Mass (lbs): 95.2 lbs Visceral Fat Rating : 25   Other Clinical Data Fasting: yes Labs: no Today's Visit #: 16 Starting Date: 05/30/22    Chief Complaint: OBESITY    History of Present Illness [name] is a 73 year old female with obesity and type two diabetes who presents for obesity treatment plan assessment and progress evaluation.  She has been adhering to the category two eating plan about fifty percent of the time, facing challenges with protein intake and meal skipping. She is making efforts to consume more whole foods, such as fruits and vegetables, and is trying to maintain hydration. Despite these efforts, she has gained nine pounds over the past two months since her last visit.  She is taking Trulicity , metformin , and dapagliflozin  for her type two diabetes, and is working on diet and exercise as part of her management. She experiences hypoglycemia upon waking, prompting her to consume quick meals like cereal with milk or juice. Her irregular sleep schedule since retirement results in her eating her first meal late in the day and having dinner around 8 PM.  She engages in chair exercises three days a week for twenty minutes. She struggles with meal planning and often resorts to quick, convenient meals due to lack of planning. She has considered grocery delivery to help manage her diet better.  She has a history of neck issues, including numbness in her left hand and arm, affecting daily activities like dressing. She is awaiting neck  surgery to address disc issues. She also experiences lower back pain, limiting her mobility and causing her to lean forward when walking, even with a rollator. She has had recent falls, one outside while stepping off a curb and another at home, but did not sustain injuries.  Her social history includes being retired, living alone, and having a cat. She tends to sleep late and stay up watching TV, affecting her meal timing.      PHYSICAL EXAM:  Blood pressure 122/62, pulse (!) 58, temperature 97.8 F (36.6 C), height 5' (1.524 m), weight 259 lb (117.5 kg), SpO2 95%. Body mass index is 50.58 kg/m.  DIAGNOSTIC DATA REVIEWED:  BMET    Component Value Date/Time   NA 139 05/30/2022 1048   K 4.9 05/30/2022 1048   CL 103 05/30/2022 1048   CO2 17 (L) 05/30/2022 1048   GLUCOSE 186 (H) 05/30/2022 1048   GLUCOSE 226 (H) 08/06/2019 0111   BUN 34 (H) 05/30/2022 1048   CREATININE 1.27 (H) 05/30/2022 1048   CALCIUM  9.2 05/30/2022 1048   GFRNONAA 40 06/07/2023 1456   GFRAA 43 (L) 08/06/2019 0111   Lab Results  Component Value Date   HGBA1C 7.7 (H) 05/30/2022   HGBA1C 7.9 09/18/2014   Lab Results  Component Value Date   INSULIN  24.3 05/30/2022   Lab Results  Component Value Date   TSH 0.47 06/07/2023   CBC    Component Value Date/Time   WBC 10.0 05/30/2022 1048   WBC 11.0 (H) 08/06/2019 0111   RBC  4.53 05/30/2022 1048   RBC 4.57 08/06/2019 0111   HGB 12.4 05/30/2022 1048   HCT 40.0 05/30/2022 1048   PLT 378 05/30/2022 1048   MCV 88 05/30/2022 1048   MCH 27.4 05/30/2022 1048   MCH 29.3 08/06/2019 0111   MCHC 31.0 (L) 05/30/2022 1048   MCHC 31.4 08/06/2019 0111   RDW 15.4 05/30/2022 1048   Iron Studies No results found for: IRON, TIBC, FERRITIN, IRONPCTSAT Lipid Panel     Component Value Date/Time   CHOL 111 05/30/2022 1048   TRIG 114 05/30/2022 1048   HDL 35 (L) 05/30/2022 1048   LDLCALC 55 05/30/2022 1048   Hepatic Function Panel     Component Value  Date/Time   PROT 6.6 05/30/2022 1048   ALBUMIN 3.9 05/30/2022 1048   AST 19 05/30/2022 1048   ALT 13 05/30/2022 1048   ALKPHOS 87 05/30/2022 1048   BILITOT <0.2 05/30/2022 1048      Component Value Date/Time   TSH 0.47 06/07/2023 1456   TSH 0.790 05/30/2022 1048   Nutritional Lab Results  Component Value Date   VD25OH 34.8 05/30/2022   VD25OH 31.4 11/28/2017   VD25OH 30.5 07/18/2017     Assessment and Plan Assessment & Plan Obesity Obesity management is ongoing with a category two eating plan, which is being followed approximately 50% of the time. There is a lack of protein intake and meal skipping, contributing to a slowed metabolism. Weight gain of nine pounds over the last two months. Current exercise includes chair exercises three times a week for 20 minutes. Challenges include irregular meal planning and late-night eating, exacerbated by a lack of routine due to retirement. - Provide category two eating plan and grocery list for meal planning. - Encourage regular meal planning and protein intake. - Suggest grocery delivery to avoid impulse purchases and ensure availability of healthy foods. - Recommend considering meal delivery services for convenience. - Encourage maintaining a regular daily routine to aid in meal planning and metabolism. - Continue chair exercises three times a week.  Type 2 diabetes mellitus Type 2 diabetes is being managed with Trulicity , metformin , and dapagliflozin . Blood sugar levels are low upon waking due to irregular eating patterns. Current dietary habits include consuming cereal with milk or juice, leading to inadequate blood sugar control. She struggles with maintaining consistent blood sugar levels due to irregular meal timing and lack of protein intake. - Continue current medications: Trulicity , metformin , and dapagliflozin . - Encourage regular meal timing to stabilize blood sugar levels. - Provide a list of 100-calorie snacks with protein  content to help manage blood sugar levels. - Discuss the importance of protein intake in managing diabetes and metabolism.  Follow-Up Follow-up is necessary to monitor progress with obesity and diabetes management, especially in light of upcoming neck surgery. - Schedule follow-up appointment in four weeks, with flexibility to adjust based on surgery schedule.     I have personally spent 30 minutes total time today in preparation, patient care, and documentation for this visit, including the following: review of clinical lab tests; review of medical history and nutritional counseling   She was informed of the importance of frequent follow up visits to maximize her success with intensive lifestyle modifications for her multiple health conditions.    Louann Penton, MD

## 2023-11-06 ENCOUNTER — Other Ambulatory Visit (HOSPITAL_COMMUNITY): Payer: Self-pay

## 2023-11-06 ENCOUNTER — Other Ambulatory Visit: Payer: Self-pay

## 2023-11-06 ENCOUNTER — Other Ambulatory Visit: Payer: Self-pay | Admitting: Physician Assistant

## 2023-11-07 ENCOUNTER — Other Ambulatory Visit (HOSPITAL_COMMUNITY): Payer: Self-pay

## 2023-11-07 MED ORDER — HYDRALAZINE HCL 25 MG PO TABS
25.0000 mg | ORAL_TABLET | Freq: Two times a day (BID) | ORAL | 0 refills | Status: DC
  Filled 2023-11-07 – 2023-12-29 (×6): qty 180, 90d supply, fill #0

## 2023-11-08 ENCOUNTER — Other Ambulatory Visit (HOSPITAL_COMMUNITY): Payer: Self-pay

## 2023-11-08 ENCOUNTER — Other Ambulatory Visit: Payer: Self-pay

## 2023-11-08 DIAGNOSIS — E113512 Type 2 diabetes mellitus with proliferative diabetic retinopathy with macular edema, left eye: Secondary | ICD-10-CM | POA: Diagnosis not present

## 2023-11-08 MED ORDER — CYCLOBENZAPRINE HCL 5 MG PO TABS
5.0000 mg | ORAL_TABLET | Freq: Every evening | ORAL | 0 refills | Status: DC | PRN
  Filled 2023-11-08: qty 60, 30d supply, fill #0

## 2023-11-08 MED ORDER — AMLODIPINE BESYLATE 5 MG PO TABS
5.0000 mg | ORAL_TABLET | Freq: Every day | ORAL | 0 refills | Status: DC
  Filled 2023-11-08: qty 90, 90d supply, fill #0

## 2023-11-17 ENCOUNTER — Other Ambulatory Visit: Payer: Self-pay | Admitting: Cardiovascular Disease

## 2023-11-17 ENCOUNTER — Other Ambulatory Visit (HOSPITAL_COMMUNITY): Payer: Self-pay

## 2023-11-17 DIAGNOSIS — I480 Paroxysmal atrial fibrillation: Secondary | ICD-10-CM

## 2023-11-17 NOTE — Telephone Encounter (Signed)
 Prescription refill request for Xarelto  received.  Indication: a fib Last office visit:06/14/23 Weight: 259# Age: 73  Scr: 1.37 care everywhere 06/08/23 CrCl: 79 ml/min

## 2023-11-20 ENCOUNTER — Other Ambulatory Visit (HOSPITAL_COMMUNITY): Payer: Self-pay

## 2023-11-20 MED ORDER — RIVAROXABAN 20 MG PO TABS
20.0000 mg | ORAL_TABLET | Freq: Every day | ORAL | 1 refills | Status: AC
  Filled 2023-11-20: qty 90, 90d supply, fill #0
  Filled 2023-12-05 – 2024-02-14 (×2): qty 90, 90d supply, fill #1

## 2023-11-21 ENCOUNTER — Other Ambulatory Visit (HOSPITAL_COMMUNITY): Payer: Self-pay

## 2023-11-21 ENCOUNTER — Other Ambulatory Visit: Payer: Self-pay | Admitting: Orthopedic Surgery

## 2023-11-24 ENCOUNTER — Other Ambulatory Visit (HOSPITAL_COMMUNITY): Payer: Self-pay

## 2023-11-24 DIAGNOSIS — E113511 Type 2 diabetes mellitus with proliferative diabetic retinopathy with macular edema, right eye: Secondary | ICD-10-CM | POA: Diagnosis not present

## 2023-11-24 DIAGNOSIS — M48062 Spinal stenosis, lumbar region with neurogenic claudication: Secondary | ICD-10-CM | POA: Diagnosis not present

## 2023-11-24 DIAGNOSIS — M5412 Radiculopathy, cervical region: Secondary | ICD-10-CM | POA: Diagnosis not present

## 2023-11-24 MED ORDER — METHYLPREDNISOLONE 4 MG PO TBPK
ORAL_TABLET | ORAL | 0 refills | Status: DC
  Filled 2023-11-24: qty 21, 6d supply, fill #0

## 2023-11-24 NOTE — Progress Notes (Signed)
 Surgical Instructions   Your procedure is scheduled on Wednesday November 29, 2023. Report to [facility] Main Entrance A at 10:50 A.M., then check in with the Admitting office. Any questions or running late day of surgery: call [phone]  Questions prior to your surgery date: call [phone], Monday-Friday, 8am-4pm. If you experience any cold or flu symptoms such as cough, fever, chills, shortness of breath, etc. between now and your scheduled surgery, please notify us  at the above number.     Remember:  Do not eat after midnight the night before your surgery  You may drink clear liquids until 10:50 the morning of your surgery.   Clear liquids allowed are: Water , Non-Citrus Juices (without pulp), Carbonated Beverages, Clear Tea (no milk, honey, etc.), Black Coffee Only (NO MILK, CREAM OR POWDERED CREAMER of any kind), and Gatorade.  Patient Instructions  The night before surgery:  No food after midnight. ONLY clear liquids after midnight  The day of surgery (if you have diabetes): Drink ONE (1) 12 oz G2 given to you in your pre admission testing appointment by 10:50 the morning of surgery. Drink in one sitting. Do not sip.  This drink was given to you during your hospital  pre-op appointment visit.  Nothing else to drink after completing the  12 oz bottle of G2.         If you have questions, please contact your surgeon's office.    Take these medicines the morning of surgery with A SIP OF WATER   amLODipine  (NORVASC )  atorvastatin  (LIPITOR)  DULoxetine  (CYMBALTA )  flecainide  (TAMBOCOR )  hydrALAZINE  (APRESOLINE )  levothyroxine  (SYNTHROID )  metoprolol  tartrate (LOPRESSOR )  omeprazole  (PRILOSEC)   May take these medicines IF NEEDED: indomethacin  (INDOCIN )  pregabalin  (LYRICA )   PER YOUR CARDIOLOGIST'S INSTRUCTIONS, PLEASE HOLD YOUR rivaroxaban  (XARELTO ) 5 DAYS PRIOR TO SURGERY WITH THE LAST DOSE BEING 11/23/2023  One week prior to surgery, STOP taking any Aspirin  (unless otherwise instructed by your surgeon) Aleve, Naproxen, Ibuprofen, Motrin, Advil, Goody's, BC's, all herbal medications, fish oil, and non-prescription vitamins.   WHAT DO I DO ABOUT MY DIABETES MEDICATION?   Do not take oral diabetes medicines (pills) the morning of surgery.        DO NOT TAKE YOUR Dulaglutide  (TRULICITY ) 7 DAYS PRIOR TO SURGERY WITH THE LAST DOSE BEING NO LATER THAN 11/21/2023.        HOLD YOUR Dapagliflozin  Pro-metFORMIN  ER (XIGDUO  XR) 72 HOURS PRIOR TO SURGERY WITH THE LAST DOSE BEING 11/25/2023     THE NIGHT BEFORE SURGERY AND THE MORNING OF SURGERY USE 30 UNITS OF YOUR Insulin  Glargine (BASAGLAR  KWIKPEN), WHICH IS 50% OF YOUR REGULAR DOSE.       The day of surgery, do not take other diabetes injectables, including Byetta (exenatide), Bydureon (exenatide ER), Victoza  (liraglutide ), or Trulicity  (dulaglutide ).  If your CBG is greater than 220 mg/dL, you may take  of your sliding scale (correction) dose of insulin .   HOW TO MANAGE YOUR DIABETES BEFORE AND AFTER SURGERY  Why is it important to control my blood sugar before and after surgery? Improving blood sugar levels before and after surgery helps healing and can limit problems. A way of improving blood sugar control is eating a healthy diet by:  Eating less sugar and carbohydrates  Increasing activity/exercise  Talking with your doctor about reaching your blood sugar goals High blood sugars (greater than 180 mg/dL) can raise your risk of infections and slow your recovery, so you will need to focus on controlling your  diabetes during the weeks before surgery. Make sure that the doctor who takes care of your diabetes knows about your planned surgery including the date and location.  How do I manage my blood sugar before surgery? Check your blood sugar at least 4 times a day, starting 2 days before surgery, to make sure that the level is not too high or low.  Check your blood sugar the morning of your  surgery when you wake up and every 2 hours until you get to the Short Stay unit.  If your blood sugar is less than 70 mg/dL, you will need to treat for low blood sugar: Do not take insulin . Treat a low blood sugar (less than 70 mg/dL) with  cup of clear juice (cranberry or apple), 4 glucose tablets, OR glucose gel. Recheck blood sugar in 15 minutes after treatment (to make sure it is greater than 70 mg/dL). If your blood sugar is not greater than 70 mg/dL on recheck, call [phone] for further instructions. Report your blood sugar to the short stay nurse when you get to Short Stay.  If you are admitted to the hospital after surgery: Your blood sugar will be checked by the staff and you will probably be given insulin  after surgery (instead of oral diabetes medicines) to make sure you have good blood sugar levels. The goal for blood sugar control after surgery is 80-180 mg/dL.                      Do NOT Smoke (Tobacco/Vaping) for 24 hours prior to your procedure.  If you use a CPAP at night, you may bring your mask/headgear for your overnight stay.   You will be asked to remove any contacts, glasses, piercing's, hearing aid's, dentures/partials prior to surgery. Please bring cases for these items if needed.    Patients discharged the day of surgery will not be allowed to drive home, and someone needs to stay with them for 24 hours.  SURGICAL WAITING ROOM VISITATION Patients may have no more than 2 support people in the waiting area - these visitors may rotate.   Pre-op nurse will coordinate an appropriate time for 1 ADULT support person, who may not rotate, to accompany patient in pre-op.  Children under the age of 55 must have an adult with them who is not the patient and must remain in the main waiting area with an adult.  If the patient needs to stay at the hospital during part of their recovery, the visitor guidelines for inpatient rooms apply.  Please refer to the [facility]  website for the visitor guidelines for any additional information.   If you received a COVID test during your pre-op visit  it is requested that you wear a mask when out in public, stay away from anyone that may not be feeling well and notify your surgeon if you develop symptoms. If you have been in contact with anyone that has tested positive in the last 10 days please notify you surgeon.      Pre-operative 5 CHG Bathing Instructions   You can play a key role in reducing the risk of infection after surgery. Your skin needs to be as free of germs as possible. You can reduce the number of germs on your skin by washing with CHG (chlorhexidine  gluconate) soap before surgery. CHG is an antiseptic soap that kills germs and continues to kill germs even after washing.   DO NOT use if you have an  allergy to chlorhexidine /CHG or antibacterial soaps. If your skin becomes reddened or irritated, stop using the CHG and notify one of our RNs at [phone].   Please shower with the CHG soap starting 4 days before surgery using the following schedule:     Please keep in mind the following:  DO NOT shave, including legs and underarms, starting the day of your first shower.   Place clean sheets on your bed the day you start using CHG soap. Use a clean washcloth (not used since being washed) for each shower. DO NOT sleep with pets once you start using the CHG.   CHG Shower Instructions:  Wash your face and private area with normal soap. If you choose to wash your hair, wash first with your normal shampoo.  After you use shampoo/soap, rinse your hair and body thoroughly to remove shampoo/soap residue.  Turn the water  OFF and apply about 3 tablespoons (45 ml) of CHG soap to a CLEAN washcloth.  Apply CHG soap ONLY FROM YOUR NECK DOWN TO YOUR TOES (washing for 3-5 minutes)  DO NOT use CHG soap on face, private areas, open wounds, or sores.  Pay special attention to the area where your surgery is being  performed.  If you are having back surgery, having someone wash your back for you may be helpful. Wait 2 minutes after CHG soap is applied, then you may rinse off the CHG soap.  Pat dry with a clean towel  Put on clean clothes/pajamas   If you choose to wear lotion, please use ONLY the CHG-compatible lotions that are listed below.  Additional instructions for the day of surgery: DO NOT APPLY any lotions, deodorants or perfumes.   Do not bring valuables to the hospital. [facility] is not responsible for any belongings/valuables. Do not wear nail polish, gel polish, artificial nails, or any other type of covering on natural nails (fingers and toes) Do not wear jewelry or makeup Put on clean/comfortable clothes.  Please brush your teeth.  Ask your nurse before applying any prescription medications to the skin.     CHG Compatible Lotions   Aveeno Moisturizing lotion  Cetaphil Moisturizing Cream  Cetaphil Moisturizing Lotion  Clairol Herbal Essence Moisturizing Lotion, Dry Skin  Clairol Herbal Essence Moisturizing Lotion, Extra Dry Skin  Clairol Herbal Essence Moisturizing Lotion, Normal Skin  Curel Age Defying Therapeutic Moisturizing Lotion with Alpha Hydroxy  Curel Extreme Care Body Lotion  Curel Soothing Hands Moisturizing Hand Lotion  Curel Therapeutic Moisturizing Cream, Fragrance-Free  Curel Therapeutic Moisturizing Lotion, Fragrance-Free  Curel Therapeutic Moisturizing Lotion, Original Formula  Eucerin Daily Replenishing Lotion  Eucerin Dry Skin Therapy Plus Alpha Hydroxy Crme  Eucerin Dry Skin Therapy Plus Alpha Hydroxy Lotion  Eucerin Original Crme  Eucerin Original Lotion  Eucerin Plus Crme Eucerin Plus Lotion  Eucerin TriLipid Replenishing Lotion  Keri Anti-Bacterial Hand Lotion  Keri Deep Conditioning Original Lotion Dry Skin Formula Softly Scented  Keri Deep Conditioning Original Lotion, Fragrance Free Sensitive Skin Formula  Keri Lotion Fast Absorbing  Fragrance Free Sensitive Skin Formula  Keri Lotion Fast Absorbing Softly Scented Dry Skin Formula  Keri Original Lotion  Keri Skin Renewal Lotion Keri Silky Smooth Lotion  Keri Silky Smooth Sensitive Skin Lotion  Nivea Body Creamy Conditioning Oil  Nivea Body Extra Enriched Teacher, adult education Moisturizing Lotion Nivea Crme  Nivea Skin Firming Lotion  NutraDerm 30 Skin Lotion  NutraDerm Skin Lotion  NutraDerm Therapeutic Skin Cream  NutraDerm Therapeutic  Skin Lotion  ProShield Protective Hand Cream  Provon moisturizing lotion  Please read over the following fact sheets that you were given.

## 2023-11-27 ENCOUNTER — Encounter (HOSPITAL_COMMUNITY): Payer: Self-pay

## 2023-11-27 ENCOUNTER — Encounter (HOSPITAL_COMMUNITY)
Admission: RE | Admit: 2023-11-27 | Discharge: 2023-11-27 | Disposition: A | Discharge status: Home / Self Care | Source: Ambulatory Visit | Attending: Orthopedic Surgery | Admitting: Orthopedic Surgery

## 2023-11-27 ENCOUNTER — Other Ambulatory Visit: Payer: Self-pay

## 2023-11-27 VITALS — BP 147/72 | HR 66 | Temp 98.4°F | Resp 18 | Ht 60.0 in | Wt 258.6 lb

## 2023-11-27 DIAGNOSIS — I5032 Chronic diastolic (congestive) heart failure: Secondary | ICD-10-CM | POA: Insufficient documentation

## 2023-11-27 DIAGNOSIS — Z961 Presence of intraocular lens: Secondary | ICD-10-CM | POA: Diagnosis not present

## 2023-11-27 DIAGNOSIS — E119 Type 2 diabetes mellitus without complications: Secondary | ICD-10-CM | POA: Insufficient documentation

## 2023-11-27 DIAGNOSIS — I110 Hypertensive heart disease with heart failure: Secondary | ICD-10-CM | POA: Insufficient documentation

## 2023-11-27 DIAGNOSIS — Z01812 Encounter for preprocedural laboratory examination: Secondary | ICD-10-CM | POA: Insufficient documentation

## 2023-11-27 DIAGNOSIS — Z7901 Long term (current) use of anticoagulants: Secondary | ICD-10-CM | POA: Insufficient documentation

## 2023-11-27 DIAGNOSIS — H52203 Unspecified astigmatism, bilateral: Secondary | ICD-10-CM | POA: Diagnosis not present

## 2023-11-27 DIAGNOSIS — E113293 Type 2 diabetes mellitus with mild nonproliferative diabetic retinopathy without macular edema, bilateral: Secondary | ICD-10-CM | POA: Diagnosis not present

## 2023-11-27 DIAGNOSIS — Z794 Long term (current) use of insulin: Secondary | ICD-10-CM | POA: Insufficient documentation

## 2023-11-27 DIAGNOSIS — G4733 Obstructive sleep apnea (adult) (pediatric): Secondary | ICD-10-CM | POA: Insufficient documentation

## 2023-11-27 DIAGNOSIS — I480 Paroxysmal atrial fibrillation: Secondary | ICD-10-CM | POA: Insufficient documentation

## 2023-11-27 DIAGNOSIS — Z01818 Encounter for other preprocedural examination: Secondary | ICD-10-CM

## 2023-11-27 LAB — BASIC METABOLIC PANEL WITH GFR
Anion gap: 9 (ref 5–15)
BUN: 30 mg/dL — ABNORMAL HIGH (ref 8–23)
CO2: 27 mmol/L (ref 22–32)
Calcium: 9.3 mg/dL (ref 8.9–10.3)
Chloride: 106 mmol/L (ref 98–111)
Creatinine, Ser: 1.24 mg/dL — ABNORMAL HIGH (ref 0.44–1.00)
GFR, Estimated: 46 mL/min — ABNORMAL LOW (ref >60)
Glucose, Bld: 83 mg/dL (ref 70–99)
Potassium: 4.2 mmol/L (ref 3.5–5.1)
Sodium: 142 mmol/L (ref 135–145)

## 2023-11-27 LAB — CBC
HCT: 41.7 % (ref 36.0–46.0)
Hemoglobin: 12.7 g/dL (ref 12.0–15.0)
MCH: 26.5 pg (ref 26.0–34.0)
MCHC: 30.5 g/dL (ref 30.0–36.0)
MCV: 87.1 fL (ref 80.0–100.0)
Platelets: 348 K/uL (ref 150–400)
RBC: 4.79 MIL/uL (ref 3.87–5.11)
RDW: 16.6 % — ABNORMAL HIGH (ref 11.5–15.5)
WBC: 14.3 K/uL — ABNORMAL HIGH (ref 4.0–10.5)
nRBC: 0 % (ref 0.0–0.2)

## 2023-11-27 LAB — GLUCOSE, CAPILLARY: Glucose-Capillary: 78 mg/dL (ref 70–99)

## 2023-11-27 LAB — TYPE AND SCREEN
ABO/RH(D): A POS
Antibody Screen: NEGATIVE

## 2023-11-27 LAB — HEMOGLOBIN A1C
Hgb A1c MFr Bld: 6.7 % — ABNORMAL HIGH (ref 4.8–5.6)
Mean Plasma Glucose: 145.59 mg/dL

## 2023-11-27 LAB — SURGICAL PCR SCREEN
MRSA, PCR: NEGATIVE
Staphylococcus aureus: NEGATIVE

## 2023-11-27 NOTE — Progress Notes (Signed)
 Surgical Instructions   Your procedure is scheduled on Wednesday November 29, 2023. Report to [facility] Main Entrance A at 10:50 A.M., then check in with the Admitting office. Any questions or running late day of surgery: call [phone]  Questions prior to your surgery date: call [phone], Monday-Friday, 8am-4pm. If you experience any cold or flu symptoms such as cough, fever, chills, shortness of breath, etc. between now and your scheduled surgery, please notify us  at the above number.     Remember:  Do not eat after midnight the night before your surgery  You may drink clear liquids until 10:50 the morning of your surgery.   Clear liquids allowed are: Water , Non-Citrus Juices (without pulp), Carbonated Beverages, Clear Tea (no milk, honey, etc.), Black Coffee Only (NO MILK, CREAM OR POWDERED CREAMER of any kind), and Gatorade.  Patient Instructions  The night before surgery:  No food after midnight. ONLY clear liquids after midnight  The day of surgery (if you have diabetes): Drink ONE (1) 12 oz G2 given to you in your pre admission testing appointment by 10:50 the morning of surgery. Drink in one sitting. Do not sip.  This drink was given to you during your hospital  pre-op appointment visit.  Nothing else to drink after completing the  12 oz bottle of G2.         If you have questions, please contact your surgeon's office.    Take these medicines the morning of surgery with A SIP OF WATER   amLODipine  (NORVASC )  atorvastatin  (LIPITOR)  DULoxetine  (CYMBALTA )  flecainide  (TAMBOCOR )  hydrALAZINE  (APRESOLINE )  levothyroxine  (SYNTHROID )  metoprolol  tartrate (LOPRESSOR )  omeprazole  (PRILOSEC)   May take these medicines IF NEEDED: indomethacin  (INDOCIN )  pregabalin  (LYRICA )   Follow your surgeon's instructions on stopping Xarelto .   One week prior to surgery, STOP taking any Aspirin (unless otherwise instructed by your surgeon) Aleve, Naproxen, Ibuprofen,  Motrin, Advil, Goody's, BC's, all herbal medications, fish oil, and non-prescription vitamins.   WHAT DO I DO ABOUT MY DIABETES MEDICATION?   Do not take oral diabetes medicines (pills) the morning of surgery.        DO NOT TAKE YOUR Dulaglutide  (TRULICITY ) 7 DAYS PRIOR TO SURGERY WITH THE LAST DOSE BEING NO LATER THAN 11/21/2023.        HOLD YOUR Dapagliflozin  Pro-metFORMIN  ER (XIGDUO  XR) 72 HOURS PRIOR TO SURGERY WITH THE LAST DOSE BEING 11/25/2023     THE NIGHT BEFORE SURGERY AND THE MORNING OF SURGERY USE 27 UNITS OF YOUR Insulin  Glargine (BASAGLAR  KWIKPEN), WHICH IS 50% OF YOUR REGULAR DOSE. (TAKE HALF DOSE)      The day of surgery, do not take other diabetes injectables, including Byetta (exenatide), Bydureon (exenatide ER), Victoza  (liraglutide ), or Trulicity  (dulaglutide ).  If your CBG is greater than 220 mg/dL, you may take  of your sliding scale (correction) dose of insulin .   HOW TO MANAGE YOUR DIABETES BEFORE AND AFTER SURGERY  Why is it important to control my blood sugar before and after surgery? Improving blood sugar levels before and after surgery helps healing and can limit problems. A way of improving blood sugar control is eating a healthy diet by:  Eating less sugar and carbohydrates  Increasing activity/exercise  Talking with your doctor about reaching your blood sugar goals High blood sugars (greater than 180 mg/dL) can raise your risk of infections and slow your recovery, so you will need to focus on controlling your diabetes during the weeks before surgery. Make sure that the  doctor who takes care of your diabetes knows about your planned surgery including the date and location.  How do I manage my blood sugar before surgery? Check your blood sugar at least 4 times a day, starting 2 days before surgery, to make sure that the level is not too high or low.  Check your blood sugar the morning of your surgery when you wake up and every 2 hours until you get to the  Short Stay unit.  If your blood sugar is less than 70 mg/dL, you will need to treat for low blood sugar: Do not take insulin . Treat a low blood sugar (less than 70 mg/dL) with  cup of clear juice (cranberry or apple), 4 glucose tablets, OR glucose gel. Recheck blood sugar in 15 minutes after treatment (to make sure it is greater than 70 mg/dL). If your blood sugar is not greater than 70 mg/dL on recheck, call [phone] for further instructions. Report your blood sugar to the short stay nurse when you get to Short Stay.  If you are admitted to the hospital after surgery: Your blood sugar will be checked by the staff and you will probably be given insulin  after surgery (instead of oral diabetes medicines) to make sure you have good blood sugar levels. The goal for blood sugar control after surgery is 80-180 mg/dL.                      Do NOT Smoke (Tobacco/Vaping) for 24 hours prior to your procedure.  If you use a CPAP at night, you may bring your mask/headgear for your overnight stay.   You will be asked to remove any contacts, glasses, piercing's, hearing aid's, dentures/partials prior to surgery. Please bring cases for these items if needed.    Patients discharged the day of surgery will not be allowed to drive home, and someone needs to stay with them for 24 hours.  SURGICAL WAITING ROOM VISITATION Patients may have no more than 2 support people in the waiting area - these visitors may rotate.   Pre-op nurse will coordinate an appropriate time for 1 ADULT support person, who may not rotate, to accompany patient in pre-op.  Children under the age of 46 must have an adult with them who is not the patient and must remain in the main waiting area with an adult.  If the patient needs to stay at the hospital during part of their recovery, the visitor guidelines for inpatient rooms apply.  Please refer to the [facility] website for the visitor guidelines for any additional  information.   If you received a COVID test during your pre-op visit  it is requested that you wear a mask when out in public, stay away from anyone that may not be feeling well and notify your surgeon if you develop symptoms. If you have been in contact with anyone that has tested positive in the last 10 days please notify you surgeon.      Pre-operative 5 CHG Bathing Instructions   You can play a key role in reducing the risk of infection after surgery. Your skin needs to be as free of germs as possible. You can reduce the number of germs on your skin by washing with CHG (chlorhexidine  gluconate) soap before surgery. CHG is an antiseptic soap that kills germs and continues to kill germs even after washing.   DO NOT use if you have an allergy to chlorhexidine /CHG or antibacterial soaps. If your skin becomes  reddened or irritated, stop using the CHG and notify one of our RNs at [phone].   Please shower with the CHG soap starting 4 days before surgery using the following schedule:     Please keep in mind the following:  DO NOT shave, including legs and underarms, starting the day of your first shower.   Place clean sheets on your bed the day you start using CHG soap. Use a clean washcloth (not used since being washed) for each shower. DO NOT sleep with pets once you start using the CHG.   CHG Shower Instructions:  Wash your face and private area with normal soap. If you choose to wash your hair, wash first with your normal shampoo.  After you use shampoo/soap, rinse your hair and body thoroughly to remove shampoo/soap residue.  Turn the water  OFF and apply about 3 tablespoons (45 ml) of CHG soap to a CLEAN washcloth.  Apply CHG soap ONLY FROM YOUR NECK DOWN TO YOUR TOES (washing for 3-5 minutes)  DO NOT use CHG soap on face, private areas, open wounds, or sores.  Pay special attention to the area where your surgery is being performed.  If you are having back surgery, having someone  wash your back for you may be helpful. Wait 2 minutes after CHG soap is applied, then you may rinse off the CHG soap.  Pat dry with a clean towel  Put on clean clothes/pajamas   If you choose to wear lotion, please use ONLY the CHG-compatible lotions that are listed below.  Additional instructions for the day of surgery: DO NOT APPLY any lotions, deodorants or perfumes.   Do not bring valuables to the hospital. [facility] is not responsible for any belongings/valuables. Do not wear nail polish, gel polish, artificial nails, or any other type of covering on natural nails (fingers and toes) Do not wear jewelry or makeup Put on clean/comfortable clothes.  Please brush your teeth.  Ask your nurse before applying any prescription medications to the skin.     CHG Compatible Lotions   Aveeno Moisturizing lotion  Cetaphil Moisturizing Cream  Cetaphil Moisturizing Lotion  Clairol Herbal Essence Moisturizing Lotion, Dry Skin  Clairol Herbal Essence Moisturizing Lotion, Extra Dry Skin  Clairol Herbal Essence Moisturizing Lotion, Normal Skin  Curel Age Defying Therapeutic Moisturizing Lotion with Alpha Hydroxy  Curel Extreme Care Body Lotion  Curel Soothing Hands Moisturizing Hand Lotion  Curel Therapeutic Moisturizing Cream, Fragrance-Free  Curel Therapeutic Moisturizing Lotion, Fragrance-Free  Curel Therapeutic Moisturizing Lotion, Original Formula  Eucerin Daily Replenishing Lotion  Eucerin Dry Skin Therapy Plus Alpha Hydroxy Crme  Eucerin Dry Skin Therapy Plus Alpha Hydroxy Lotion  Eucerin Original Crme  Eucerin Original Lotion  Eucerin Plus Crme Eucerin Plus Lotion  Eucerin TriLipid Replenishing Lotion  Keri Anti-Bacterial Hand Lotion  Keri Deep Conditioning Original Lotion Dry Skin Formula Softly Scented  Keri Deep Conditioning Original Lotion, Fragrance Free Sensitive Skin Formula  Keri Lotion Fast Absorbing Fragrance Free Sensitive Skin Formula  Keri Lotion Fast Absorbing  Softly Scented Dry Skin Formula  Keri Original Lotion  Keri Skin Renewal Lotion Keri Silky Smooth Lotion  Keri Silky Smooth Sensitive Skin Lotion  Nivea Body Creamy Conditioning Oil  Nivea Body Extra Enriched Teacher, adult education Moisturizing Lotion Nivea Crme  Nivea Skin Firming Lotion  NutraDerm 30 Skin Lotion  NutraDerm Skin Lotion  NutraDerm Therapeutic Skin Cream  NutraDerm Therapeutic Skin Lotion  ProShield Protective Hand Cream  Provon moisturizing  lotion  Please read over the following fact sheets that you were given.

## 2023-11-27 NOTE — Progress Notes (Signed)
 PCP - Dwight Trula SQUIBB, MD Cardiologist - Delford Maude BROCKS, MD Endocrinology- Dr Reyes Alexander  PPM/ICD - denies   Chest x-ray - N/A EKG - 06/14/23 Stress Test - 07/20/21 ECHO - 07/19/21 Cardiac Cath - denies  Sleep Study - pt wears CPAP nightly, does not know pressure settings   Fasting Blood Sugar - CGM to left arm- Freestyle Libre- CBG typically 90-110 per patient. A1c drawn today.    Last dose of GLP1 agonist-  Trulicity - last dose 11/25/23 Pt last had  Dapagliflozin  Pro-metFORMIN  ER (XIGDUO  XR) 12-998 MG TB24 11/27/2023   Blood Thinner Instructions: Xarelto - last dose 11/26/23 evening- I notified Arland at Dr. Andrey office. Per Dr. Beuford, this should be ok for surgery on Wednesday.    ERAS Protcol - ERAS + G2   COVID TEST- N/A   Anesthesia review: yes- cardiac clearance, pt on Xarelto . Pt had Trulicity  11/25/23. Pt with hx Afib. BP elevated at PAT- pt had taken BP meds this morning- initially 183/81 re-check 147/72  Patient denies shortness of breath, fever, cough and chest pain at PAT appointment   All instructions explained to the patient, with a verbal understanding of the material. Patient agrees to go over the instructions while at home for a better understanding.  The opportunity to ask questions was provided.

## 2023-11-28 NOTE — Progress Notes (Addendum)
 Anesthesia Chart Review:  73 year old female follows with cardiology for history of paroxysmal A-fib on flecainide  and Xarelto , HFpEF, HTN, HLD, OSA on CPAP.  Nuclear stress 07/2021 was nonischemic.  Echo 07/2021 with EF 55 to 60%, normal wall motion, grade 1 DD, normal RV, no significant valvular abnormalities.  Patient was recently cleared to hold Xarelto  for cervical ESI per telephone encounter 09/25/2023 by Raphael Bring, PA-C, Chart reviewed as part of pre-operative protocol coverage. As below, surgical team requested input from Dr. Delford on holding of anticoagulation. Per Dr. Delford, Guidelines not specific to neurosurgery. Group them with high risk bleeding Rec 72 hour hold for this She has some renal impairment Cr1.37 which slows clearance of eliquis She is in NSR so low embolic risk off DOAC. I would hold 5 days before as a good compromise between 3 and 7 days.  She also had recent surgical clearance per progress note 06/14/2023 by Glendia Ferrier, PA-C stating, Ms. Lapine's perioperative risk of a major cardiac event is 6.6% according to the Revised Cardiac Risk Index (RCRI) which equates to a high risk for perioperative complications.   However, her functional capacity is fair at 4.3 METs according to the Duke Activity Status Index (DASI). Recommendations: Therefore, According to ACC/AHA guidelines, no further cardiovascular testing needed.  The patient may proceed to surgery at acceptable risk. Antiplatelet and/or Anticoagulation Recommendations: Xarelto  (Rivaroxaban ) can be held for 3 days prior to surgery.  Please resume post op when felt to be safe.  Other pertinent history includes PONV, IDDM2, GERD on PPI.  Patient reports last dose of Xarelto  at 11/26/2023.  Last dose Trulicity  11/25/2023.  Preop labs reviewed, creatinine mildly elevated 1.24, WBC mildly elevated 14.3, otherwise unremarkable.  DM2 well-controlled with A1c 6.7.  EKG 06/14/23: Normal sinus rhythm First degree heart block.  Rate 70.  Cannot rule out Anterior infarct  Event monitor 08/2021: Patient had a min HR of 45 bpm, max HR of 179 bpm, and avg HR of 72 bpm. Predominant underlying rhythm was Sinus Rhythm. First Degree AV Block was present. Atrial Fibrillation occurred (18% burden), ranging from 83-179 bpm (avg of 122 bpm), the longest  lasting 5 hours 44 mins with an avg rate of 123 bpm. Atrial Fibrillation was detected within +/- 45 seconds of symptomatic patient event(s). Isolated SVEs were rare (<1.0%), SVE Couplets were rare (<1.0%), and SVE Triplets were rare (<1.0%). Isolated VEs were rare (<1.0%), and no VE Couplets or VE Triplets were present.  Nuclear stress 07/20/2021:   Findings are consistent with prior myocardial infarction. The study is low risk.   No ST deviation was noted.   LV perfusion is abnormal. There is no evidence of ischemia. There is evidence of infarction. Defect 1: There is a small defect with mild reduction in uptake present in the apical to mid inferolateral location(s) that is fixed. There is abnormal wall motion in the defect area. Consistent with infarction.   Left ventricular function is normal. Nuclear stress EF: 57 %. The left ventricular ejection fraction is normal (55-65%). End diastolic cavity size is mildly enlarged. End systolic cavity size is mildly enlarged.   Prior study available for comparison from 07/21/2016.   Fixed perfusion defect in mid to apical inferolateral wall with wall motion abnormality, consistent with infarct Normal EF (57%) Low risk study.  No ischemia  TTE 07/19/2021: 1. Left ventricular ejection fraction, by estimation, is 55 to 60%. The  left ventricle has normal function. The left ventricle has no regional  wall motion  abnormalities. There is mild left ventricular hypertrophy.  Left ventricular diastolic parameters  are consistent with Grade I diastolic dysfunction (impaired relaxation).   2. Right ventricular systolic function is normal. The right ventricular   size is normal. There is normal pulmonary artery systolic pressure. The  estimated right ventricular systolic pressure is 29.2 mmHg.   3. Left atrial size was mildly dilated.   4. Right atrial size was mildly dilated.   5. The mitral valve is normal in structure. Trivial mitral valve  regurgitation. No evidence of mitral stenosis. Moderate mitral annular  calcification.   6. The aortic valve is tricuspid. There is mild calcification of the  aortic valve. Aortic valve regurgitation is not visualized. No aortic  stenosis is present.   7. The inferior vena cava is normal in size with greater than 50%  respiratory variability, suggesting right atrial pressure of 3 mmHg.      Lynwood Geofm RIGGERS [facility]/Anesthesiology Phone [phone] 11/28/2023 9:27 AM

## 2023-11-28 NOTE — Anesthesia Preprocedure Evaluation (Addendum)
 Anesthesia Evaluation  Patient identified by MRN, date of birth, ID band Patient awake    Reviewed: Allergy & Precautions, H&P , NPO status , Patient's Chart, lab work & pertinent test results  History of Anesthesia Complications (+) PONV and history of anesthetic complications  Airway Mallampati: II   Neck ROM: full    Dental   Pulmonary shortness of breath, sleep apnea , former smoker   breath sounds clear to auscultation       Cardiovascular hypertension, + dysrhythmias Atrial Fibrillation  Rhythm:regular Rate:Normal     Neuro/Psych  PSYCHIATRIC DISORDERS  Depression       GI/Hepatic ,GERD  ,,  Endo/Other  diabetes, Type 2Hypothyroidism    Renal/GU      Musculoskeletal  (+) Arthritis ,    Abdominal   Peds  Hematology   Anesthesia Other Findings   Reproductive/Obstetrics                              Anesthesia Physical Anesthesia Plan  ASA: 3  Anesthesia Plan: General   Post-op Pain Management:    Induction: Intravenous  PONV Risk Score and Plan: 4 or greater and Ondansetron , Dexamethasone  and Treatment may vary due to age or medical condition  Airway Management Planned: Oral ETT and Video Laryngoscope Planned  Additional Equipment: Arterial line  Intra-op Plan:   Post-operative Plan: Extubation in OR  Informed Consent: I have reviewed the patients History and Physical, chart, labs and discussed the procedure including the risks, benefits and alternatives for the proposed anesthesia with the patient or authorized representative who has indicated his/her understanding and acceptance.     Dental advisory given  Plan Discussed with: CRNA, Anesthesiologist and Surgeon  Anesthesia Plan Comments: (PAT note by Lynwood Hope, PA-C: 73 year old female follows with cardiology for history of paroxysmal A-fib on flecainide  and Xarelto , HFpEF, HTN, HLD, OSA on CPAP.  Nuclear stress  07/2021 was nonischemic.  Echo 07/2021 with EF 55 to 60%, normal wall motion, grade 1 DD, normal RV, no significant valvular abnormalities.  Patient was recently cleared to hold Xarelto  for cervical ESI per telephone encounter 09/25/2023 by Raphael Bring, PA-C, Chart reviewed as part of pre-operative protocol coverage. As below, surgical team requested input from Dr. Delford on holding of anticoagulation. Per Dr. Delford, Guidelines not specific to neurosurgery. Group them with high risk bleeding Rec 72 hour hold for this She has some renal impairment Cr1.37 which slows clearance of eliquis She is in NSR so low embolic risk off DOAC. I would hold 5 days before as a good compromise between 3 and 7 days.  She also had recent surgical clearance per progress note 06/14/2023 by Glendia Ferrier, PA-C stating, Ms. Gelardi's perioperative risk of a major cardiac event is 6.6% according to the Revised Cardiac Risk Index (RCRI) which equates to a high risk for perioperative complications.   However, her functional capacity is fair at 4.3 METs according to the Duke Activity Status Index (DASI). Recommendations: Therefore, According to ACC/AHA guidelines, no further cardiovascular testing needed.  The patient may proceed to surgery at acceptable risk. Antiplatelet and/or Anticoagulation Recommendations: Xarelto  (Rivaroxaban ) can be held for 3 days prior to surgery.  Please resume post op when felt to be safe.  Other pertinent history includes PONV, IDDM2, GERD on PPI.  Patient reports last dose of Xarelto  at 11/26/2023.  Last dose Trulicity  11/25/2023.  Preop labs reviewed, creatinine mildly elevated 1.24, WBC mildly elevated 14.3, otherwise  unremarkable.  DM2 well-controlled with A1c 6.7.  EKG 06/14/23: Normal sinus rhythm First degree heart block.  Rate 70. Cannot rule out Anterior infarct  Event monitor 08/2021: Patient had a min HR of 45 bpm, max HR of 179 bpm, and avg HR of 72 bpm. Predominant underlying rhythm was Sinus  Rhythm. First Degree AV Block was present. Atrial Fibrillation occurred (18% burden), ranging from 83-179 bpm (avg of 122 bpm), the longest  lasting 5 hours 44 mins with an avg rate of 123 bpm. Atrial Fibrillation was detected within +/- 45 seconds of symptomatic patient event(s). Isolated SVEs were rare (<1.0%), SVE Couplets were rare (<1.0%), and SVE Triplets were rare (<1.0%). Isolated VEs were rare (<1.0%), and no VE Couplets or VE Triplets were present.  Nuclear stress 07/20/2021:   Findings are consistent with prior myocardial infarction. The study is low risk.   No ST deviation was noted.   LV perfusion is abnormal. There is no evidence of ischemia. There is evidence of infarction. Defect 1: There is a small defect with mild reduction in uptake present in the apical to mid inferolateral location(s) that is fixed. There is abnormal wall motion in the defect area. Consistent with infarction.   Left ventricular function is normal. Nuclear stress EF: 57 %. The left ventricular ejection fraction is normal (55-65%). End diastolic cavity size is mildly enlarged. End systolic cavity size is mildly enlarged.   Prior study available for comparison from 07/21/2016.  1. Fixed perfusion defect in mid to apical inferolateral wall with wall motion abnormality, consistent with infarct 2. Normal EF (57%) 3. Low risk study.  No ischemia  TTE 07/19/2021: 1. Left ventricular ejection fraction, by estimation, is 55 to 60%. The  left ventricle has normal function. The left ventricle has no regional  wall motion abnormalities. There is mild left ventricular hypertrophy.  Left ventricular diastolic parameters  are consistent with Grade I diastolic dysfunction (impaired relaxation).  2. Right ventricular systolic function is normal. The right ventricular  size is normal. There is normal pulmonary artery systolic pressure. The  estimated right ventricular systolic pressure is 29.2 mmHg.  3. Left atrial size  was mildly dilated.  4. Right atrial size was mildly dilated.  5. The mitral valve is normal in structure. Trivial mitral valve  regurgitation. No evidence of mitral stenosis. Moderate mitral annular  calcification.  6. The aortic valve is tricuspid. There is mild calcification of the  aortic valve. Aortic valve regurgitation is not visualized. No aortic  stenosis is present.  7. The inferior vena cava is normal in size with greater than 50%  respiratory variability, suggesting right atrial pressure of 3 mmHg.     )         Anesthesia Quick Evaluation

## 2023-11-29 ENCOUNTER — Inpatient Hospital Stay (HOSPITAL_COMMUNITY): Admitting: Anesthesiology

## 2023-11-29 ENCOUNTER — Other Ambulatory Visit: Payer: Self-pay

## 2023-11-29 ENCOUNTER — Inpatient Hospital Stay (HOSPITAL_COMMUNITY)

## 2023-11-29 ENCOUNTER — Inpatient Hospital Stay (HOSPITAL_COMMUNITY): Payer: Self-pay | Admitting: Physician Assistant

## 2023-11-29 ENCOUNTER — Encounter (HOSPITAL_COMMUNITY): Payer: Self-pay | Admitting: Orthopedic Surgery

## 2023-11-29 ENCOUNTER — Other Ambulatory Visit (HOSPITAL_COMMUNITY): Payer: Self-pay

## 2023-11-29 ENCOUNTER — Inpatient Hospital Stay (HOSPITAL_COMMUNITY)
Admission: RE | Admit: 2023-11-29 | Discharge: 2023-11-29 | DRG: 472 | Disposition: A | Discharge status: Home / Self Care | Attending: Orthopedic Surgery | Admitting: Orthopedic Surgery

## 2023-11-29 ENCOUNTER — Encounter (HOSPITAL_COMMUNITY)
Admission: RE | Disposition: A | Discharge status: Home / Self Care | Payer: Self-pay | Source: Home / Self Care | Attending: Orthopedic Surgery

## 2023-11-29 DIAGNOSIS — M5412 Radiculopathy, cervical region: Secondary | ICD-10-CM | POA: Diagnosis not present

## 2023-11-29 DIAGNOSIS — Z794 Long term (current) use of insulin: Secondary | ICD-10-CM

## 2023-11-29 DIAGNOSIS — E119 Type 2 diabetes mellitus without complications: Secondary | ICD-10-CM

## 2023-11-29 DIAGNOSIS — I110 Hypertensive heart disease with heart failure: Secondary | ICD-10-CM | POA: Diagnosis present

## 2023-11-29 DIAGNOSIS — I4891 Unspecified atrial fibrillation: Secondary | ICD-10-CM

## 2023-11-29 DIAGNOSIS — K219 Gastro-esophageal reflux disease without esophagitis: Secondary | ICD-10-CM | POA: Diagnosis present

## 2023-11-29 DIAGNOSIS — E7800 Pure hypercholesterolemia, unspecified: Secondary | ICD-10-CM | POA: Diagnosis present

## 2023-11-29 DIAGNOSIS — Z87891 Personal history of nicotine dependence: Secondary | ICD-10-CM

## 2023-11-29 DIAGNOSIS — E1122 Type 2 diabetes mellitus with diabetic chronic kidney disease: Secondary | ICD-10-CM | POA: Diagnosis present

## 2023-11-29 DIAGNOSIS — M5010 Cervical disc disorder with radiculopathy, unspecified cervical region: Secondary | ICD-10-CM | POA: Diagnosis present

## 2023-11-29 DIAGNOSIS — E669 Obesity, unspecified: Secondary | ICD-10-CM | POA: Diagnosis present

## 2023-11-29 DIAGNOSIS — Z91018 Allergy to other foods: Secondary | ICD-10-CM

## 2023-11-29 DIAGNOSIS — Z981 Arthrodesis status: Secondary | ICD-10-CM | POA: Diagnosis not present

## 2023-11-29 DIAGNOSIS — F32A Depression, unspecified: Secondary | ICD-10-CM | POA: Diagnosis present

## 2023-11-29 DIAGNOSIS — E11319 Type 2 diabetes mellitus with unspecified diabetic retinopathy without macular edema: Secondary | ICD-10-CM | POA: Diagnosis present

## 2023-11-29 DIAGNOSIS — Z809 Family history of malignant neoplasm, unspecified: Secondary | ICD-10-CM

## 2023-11-29 DIAGNOSIS — M4803 Spinal stenosis, cervicothoracic region: Secondary | ICD-10-CM | POA: Diagnosis present

## 2023-11-29 DIAGNOSIS — E039 Hypothyroidism, unspecified: Secondary | ICD-10-CM | POA: Diagnosis present

## 2023-11-29 DIAGNOSIS — Z833 Family history of diabetes mellitus: Secondary | ICD-10-CM

## 2023-11-29 DIAGNOSIS — M4802 Spinal stenosis, cervical region: Secondary | ICD-10-CM | POA: Diagnosis present

## 2023-11-29 DIAGNOSIS — Z8249 Family history of ischemic heart disease and other diseases of the circulatory system: Secondary | ICD-10-CM | POA: Diagnosis not present

## 2023-11-29 DIAGNOSIS — M5413 Radiculopathy, cervicothoracic region: Secondary | ICD-10-CM | POA: Diagnosis not present

## 2023-11-29 DIAGNOSIS — Z6841 Body Mass Index (BMI) 40.0 and over, adult: Secondary | ICD-10-CM | POA: Diagnosis not present

## 2023-11-29 DIAGNOSIS — Z8349 Family history of other endocrine, nutritional and metabolic diseases: Secondary | ICD-10-CM

## 2023-11-29 DIAGNOSIS — I252 Old myocardial infarction: Secondary | ICD-10-CM | POA: Diagnosis not present

## 2023-11-29 DIAGNOSIS — G4733 Obstructive sleep apnea (adult) (pediatric): Secondary | ICD-10-CM | POA: Diagnosis present

## 2023-11-29 DIAGNOSIS — I5032 Chronic diastolic (congestive) heart failure: Secondary | ICD-10-CM | POA: Diagnosis present

## 2023-11-29 DIAGNOSIS — I10 Essential (primary) hypertension: Secondary | ICD-10-CM | POA: Diagnosis not present

## 2023-11-29 DIAGNOSIS — M109 Gout, unspecified: Secondary | ICD-10-CM | POA: Diagnosis present

## 2023-11-29 DIAGNOSIS — Z811 Family history of alcohol abuse and dependence: Secondary | ICD-10-CM

## 2023-11-29 DIAGNOSIS — I440 Atrioventricular block, first degree: Secondary | ICD-10-CM | POA: Diagnosis present

## 2023-11-29 DIAGNOSIS — I080 Rheumatic disorders of both mitral and aortic valves: Secondary | ICD-10-CM | POA: Diagnosis present

## 2023-11-29 DIAGNOSIS — Z7985 Long-term (current) use of injectable non-insulin antidiabetic drugs: Secondary | ICD-10-CM | POA: Diagnosis not present

## 2023-11-29 DIAGNOSIS — Z79890 Hormone replacement therapy (postmenopausal): Secondary | ICD-10-CM

## 2023-11-29 DIAGNOSIS — Z8379 Family history of other diseases of the digestive system: Secondary | ICD-10-CM

## 2023-11-29 DIAGNOSIS — Z887 Allergy status to serum and vaccine status: Secondary | ICD-10-CM

## 2023-11-29 DIAGNOSIS — Z96652 Presence of left artificial knee joint: Secondary | ICD-10-CM | POA: Diagnosis present

## 2023-11-29 DIAGNOSIS — I480 Paroxysmal atrial fibrillation: Secondary | ICD-10-CM | POA: Diagnosis present

## 2023-11-29 DIAGNOSIS — Z79899 Other long term (current) drug therapy: Secondary | ICD-10-CM

## 2023-11-29 DIAGNOSIS — Z888 Allergy status to other drugs, medicaments and biological substances status: Secondary | ICD-10-CM

## 2023-11-29 HISTORY — PX: ANTERIOR CERVICAL DECOMP/DISCECTOMY FUSION: SHX1161

## 2023-11-29 LAB — GLUCOSE, CAPILLARY
Glucose-Capillary: 110 mg/dL — ABNORMAL HIGH (ref 70–99)
Glucose-Capillary: 59 mg/dL — ABNORMAL LOW (ref 70–99)
Glucose-Capillary: 93 mg/dL (ref 70–99)

## 2023-11-29 SURGERY — ANTERIOR CERVICAL DECOMPRESSION/DISCECTOMY FUSION 3 LEVELS
Anesthesia: General | Site: Spine Cervical

## 2023-11-29 MED ORDER — PHENYLEPHRINE HCL (PRESSORS) 10 MG/ML IV SOLN
INTRAVENOUS | Status: AC
  Filled 2023-11-29: qty 1

## 2023-11-29 MED ORDER — METHOCARBAMOL 500 MG PO TABS
500.0000 mg | ORAL_TABLET | Freq: Four times a day (QID) | ORAL | 2 refills | Status: AC
  Filled 2023-11-29: qty 60, 8d supply, fill #0

## 2023-11-29 MED ORDER — THROMBIN 20000 UNITS EX SOLR
CUTANEOUS | Status: AC
Start: 2023-11-29 — End: 2023-11-29
  Filled 2023-11-29: qty 20000

## 2023-11-29 MED ORDER — FENTANYL CITRATE (PF) 250 MCG/5ML IJ SOLN
INTRAMUSCULAR | Status: DC | PRN
  Administered 2023-11-29: 100 ug via INTRAVENOUS
  Administered 2023-11-29 (×3): 50 ug via INTRAVENOUS

## 2023-11-29 MED ORDER — ROCURONIUM BROMIDE 10 MG/ML (PF) SYRINGE
PREFILLED_SYRINGE | INTRAVENOUS | Status: AC
  Filled 2023-11-29: qty 10

## 2023-11-29 MED ORDER — PHENYLEPHRINE HCL-NACL 20-0.9 MG/250ML-% IV SOLN
INTRAVENOUS | Status: DC | PRN
  Administered 2023-11-29: 35 ug/min via INTRAVENOUS

## 2023-11-29 MED ORDER — BUPIVACAINE-EPINEPHRINE 0.25% -1:200000 IJ SOLN
INTRAMUSCULAR | Status: DC | PRN
  Administered 2023-11-29: 10 mL

## 2023-11-29 MED ORDER — LACTATED RINGERS IV SOLN
INTRAVENOUS | Status: DC
Start: 2023-11-29 — End: 2023-11-30

## 2023-11-29 MED ORDER — LIDOCAINE 2% (20 MG/ML) 5 ML SYRINGE
INTRAMUSCULAR | Status: DC | PRN
Start: 2023-11-29 — End: 2023-11-29
  Administered 2023-11-29: 100 mg via INTRAVENOUS

## 2023-11-29 MED ORDER — CEFAZOLIN SODIUM-DEXTROSE 2-4 GM/100ML-% IV SOLN
2.0000 g | INTRAVENOUS | Status: AC
  Administered 2023-11-29: 2 g via INTRAVENOUS
  Filled 2023-11-29: qty 100

## 2023-11-29 MED ORDER — DEXTROSE 50 % IV SOLN
INTRAVENOUS | Status: AC
  Filled 2023-11-29: qty 50

## 2023-11-29 MED ORDER — BUPIVACAINE-EPINEPHRINE (PF) 0.25% -1:200000 IJ SOLN
INTRAMUSCULAR | Status: AC
  Filled 2023-11-29: qty 30

## 2023-11-29 MED ORDER — ROCURONIUM BROMIDE 10 MG/ML (PF) SYRINGE
PREFILLED_SYRINGE | INTRAVENOUS | Status: DC | PRN
  Administered 2023-11-29: 20 mg via INTRAVENOUS
  Administered 2023-11-29: 60 mg via INTRAVENOUS

## 2023-11-29 MED ORDER — PROPOFOL 10 MG/ML IV BOLUS
INTRAVENOUS | Status: AC
  Filled 2023-11-29: qty 20

## 2023-11-29 MED ORDER — SUCCINYLCHOLINE CHLORIDE 200 MG/10ML IV SOSY
PREFILLED_SYRINGE | INTRAVENOUS | Status: AC
  Filled 2023-11-29: qty 10

## 2023-11-29 MED ORDER — DEXMEDETOMIDINE HCL IN NACL 80 MCG/20ML IV SOLN
INTRAVENOUS | Status: AC
  Filled 2023-11-29: qty 20

## 2023-11-29 MED ORDER — OXYCODONE HCL 5 MG PO TABS: 5.0000 mg | ORAL_TABLET | Freq: Once | ORAL | Status: DC | PRN

## 2023-11-29 MED ORDER — PHENYLEPHRINE 80 MCG/ML (10ML) SYRINGE FOR IV PUSH (FOR BLOOD PRESSURE SUPPORT)
PREFILLED_SYRINGE | INTRAVENOUS | Status: AC
Start: 2023-11-29 — End: 2023-11-29
  Filled 2023-11-29: qty 10

## 2023-11-29 MED ORDER — LIDOCAINE 2% (20 MG/ML) 5 ML SYRINGE
INTRAMUSCULAR | Status: AC
  Filled 2023-11-29: qty 5

## 2023-11-29 MED ORDER — SUGAMMADEX SODIUM 200 MG/2ML IV SOLN
INTRAVENOUS | Status: DC | PRN
Start: 2023-11-29 — End: 2023-11-29
  Administered 2023-11-29: 250 mg via INTRAVENOUS

## 2023-11-29 MED ORDER — HYDROCODONE-ACETAMINOPHEN 5-325 MG PO TABS
1.0000 | ORAL_TABLET | Freq: Four times a day (QID) | ORAL | 0 refills | Status: AC | PRN
  Filled 2023-11-29: qty 20, 3d supply, fill #0

## 2023-11-29 MED ORDER — KETAMINE HCL 50 MG/5ML IJ SOSY
PREFILLED_SYRINGE | INTRAMUSCULAR | Status: AC
  Filled 2023-11-29: qty 5

## 2023-11-29 MED ORDER — DEXTROSE 50 % IV SOLN
25.0000 mL | Freq: Once | INTRAVENOUS | Status: AC
  Administered 2023-11-29: 25 mL via INTRAVENOUS

## 2023-11-29 MED ORDER — DEXMEDETOMIDINE HCL IN NACL 80 MCG/20ML IV SOLN
INTRAVENOUS | Status: DC | PRN
  Administered 2023-11-29 (×3): 4 ug via INTRAVENOUS

## 2023-11-29 MED ORDER — 0.9 % SODIUM CHLORIDE (POUR BTL) OPTIME
TOPICAL | Status: DC | PRN
  Administered 2023-11-29: 1000 mL

## 2023-11-29 MED ORDER — THROMBIN 20000 UNITS EX SOLR
CUTANEOUS | Status: DC | PRN
  Administered 2023-11-29: 20000 [IU] via TOPICAL

## 2023-11-29 MED ORDER — DEXAMETHASONE SODIUM PHOSPHATE 10 MG/ML IJ SOLN
INTRAMUSCULAR | Status: AC
  Filled 2023-11-29: qty 1

## 2023-11-29 MED ORDER — MIDAZOLAM HCL 2 MG/2ML IJ SOLN
INTRAMUSCULAR | Status: AC
Start: 2023-11-29 — End: 2023-11-29
  Filled 2023-11-29: qty 2

## 2023-11-29 MED ORDER — DEXAMETHASONE SODIUM PHOSPHATE 10 MG/ML IJ SOLN
INTRAMUSCULAR | Status: DC | PRN
Start: 2023-11-29 — End: 2023-11-29
  Administered 2023-11-29: 10 mg via INTRAVENOUS

## 2023-11-29 MED ORDER — FENTANYL CITRATE (PF) 250 MCG/5ML IJ SOLN
INTRAMUSCULAR | Status: AC
  Filled 2023-11-29: qty 5

## 2023-11-29 MED ORDER — REMIFENTANIL HCL 2 MG IV SOLR
INTRAVENOUS | Status: AC
  Filled 2023-11-29: qty 2000

## 2023-11-29 MED ORDER — OXYCODONE HCL 5 MG/5ML PO SOLN: 5.0000 mg | Freq: Once | ORAL | Status: DC | PRN

## 2023-11-29 MED ORDER — PROPOFOL 10 MG/ML IV BOLUS
INTRAVENOUS | Status: DC | PRN
  Administered 2023-11-29: 200 mg via INTRAVENOUS

## 2023-11-29 MED ORDER — FENTANYL CITRATE (PF) 100 MCG/2ML IJ SOLN: 25.0000 ug | INTRAMUSCULAR | Status: DC | PRN

## 2023-11-29 MED ORDER — POVIDONE-IODINE 7.5 % EX SOLN: Freq: Once | CUTANEOUS | Status: DC

## 2023-11-29 MED ORDER — ORAL CARE MOUTH RINSE: 15.0000 mL | Freq: Once | OROMUCOSAL | Status: AC

## 2023-11-29 MED ORDER — ONDANSETRON HCL 4 MG/2ML IJ SOLN
INTRAMUSCULAR | Status: DC | PRN
  Administered 2023-11-29: 4 mg via INTRAVENOUS

## 2023-11-29 MED ORDER — CHLORHEXIDINE GLUCONATE 0.12 % MT SOLN
15.0000 mL | Freq: Once | OROMUCOSAL | Status: AC
  Administered 2023-11-29: 15 mL via OROMUCOSAL
  Filled 2023-11-29: qty 15

## 2023-11-29 MED ORDER — PROPOFOL 1000 MG/100ML IV EMUL
INTRAVENOUS | Status: AC
  Filled 2023-11-29: qty 100

## 2023-11-29 MED ORDER — INSULIN ASPART 100 UNIT/ML IJ SOLN: 0.0000 [IU] | INTRAMUSCULAR | Status: DC | PRN

## 2023-11-29 MED ORDER — PHENYLEPHRINE 80 MCG/ML (10ML) SYRINGE FOR IV PUSH (FOR BLOOD PRESSURE SUPPORT)
PREFILLED_SYRINGE | INTRAVENOUS | Status: AC
  Filled 2023-11-29: qty 10

## 2023-11-29 MED ORDER — ONDANSETRON HCL 4 MG/2ML IJ SOLN: 4.0000 mg | Freq: Four times a day (QID) | INTRAMUSCULAR | Status: DC | PRN

## 2023-11-29 MED ORDER — PHENYLEPHRINE 80 MCG/ML (10ML) SYRINGE FOR IV PUSH (FOR BLOOD PRESSURE SUPPORT)
PREFILLED_SYRINGE | INTRAVENOUS | Status: DC | PRN
  Administered 2023-11-29 (×2): 80 ug via INTRAVENOUS

## 2023-11-29 SURGICAL SUPPLY — 58 items
BAG COUNTER SPONGE SURGICOUNT (BAG) ×1 IMPLANT
BENZOIN TINCTURE PRP APPL 2/3 (GAUZE/BANDAGES/DRESSINGS) ×1 IMPLANT
BIT DRILL NEURO 2X3.1 SFT TUCH (MISCELLANEOUS) ×1 IMPLANT
BIT DRILL SRG 14X2.2XFLT CHK (BIT) IMPLANT
BLADE CLIPPER SURG (BLADE) ×1 IMPLANT
BLADE SURG 15 STRL LF DISP TIS (BLADE) ×1 IMPLANT
CAGE LORDOTIC 6 SM (Cage) IMPLANT
CANISTER SUCTION 3000ML PPV (SUCTIONS) ×1 IMPLANT
COVER SURGICAL LIGHT HANDLE (MISCELLANEOUS) ×1 IMPLANT
DEVICE ENDSKLTN IMPLANT SM 7MM (Cage) IMPLANT
DRAPE C-ARM 42X72 X-RAY (DRAPES) ×1 IMPLANT
DRAPE POUCH INSTRU U-SHP 10X18 (DRAPES) ×1 IMPLANT
DRAPE SURG 17X23 STRL (DRAPES) ×3 IMPLANT
DURAPREP 6ML APPLICATOR 50/CS (WOUND CARE) ×1 IMPLANT
ELECT COATED BLADE 2.86 ST (ELECTRODE) ×1 IMPLANT
ELECTRODE REM PT RTRN 9FT ADLT (ELECTROSURGICAL) ×1 IMPLANT
GAUZE 4X4 16PLY ~~LOC~~+RFID DBL (SPONGE) ×1 IMPLANT
GAUZE SPONGE 4X4 12PLY STRL (GAUZE/BANDAGES/DRESSINGS) ×1 IMPLANT
GLOVE BIO SURGEON STRL SZ 6.5 (GLOVE) ×1 IMPLANT
GLOVE BIO SURGEON STRL SZ8 (GLOVE) ×1 IMPLANT
GLOVE BIOGEL PI IND STRL 7.0 (GLOVE) ×1 IMPLANT
GLOVE BIOGEL PI IND STRL 8 (GLOVE) ×1 IMPLANT
GLOVE SURG ENC MOIS LTX SZ6.5 (GLOVE) ×1 IMPLANT
GOWN STRL REUS W/ TWL LRG LVL3 (GOWN DISPOSABLE) ×1 IMPLANT
GOWN STRL REUS W/ TWL XL LVL3 (GOWN DISPOSABLE) ×1 IMPLANT
GRAFT BNE MATRIX VG FRMBL SM 1 (Bone Implant) IMPLANT
IV CATH 14GX2 1/4 (CATHETERS) ×1 IMPLANT
KIT BASIN OR (CUSTOM PROCEDURE TRAY) ×1 IMPLANT
KIT TURNOVER KIT B (KITS) ×1 IMPLANT
NDL PRECISIONGLIDE 27X1.5 (NEEDLE) ×1 IMPLANT
NDL SPNL 18GX3.5 QUINCKE PK (NEEDLE) ×1 IMPLANT
NEEDLE PRECISIONGLIDE 27X1.5 (NEEDLE) ×1 IMPLANT
NEEDLE SPNL 18GX3.5 QUINCKE PK (NEEDLE) ×1 IMPLANT
NS IRRIG 1000ML POUR BTL (IV SOLUTION) ×1 IMPLANT
PACK ORTHO CERVICAL (CUSTOM PROCEDURE TRAY) ×1 IMPLANT
PAD ARMBOARD POSITIONER FOAM (MISCELLANEOUS) ×1 IMPLANT
PATTIES SURGICAL .5 X.5 (GAUZE/BANDAGES/DRESSINGS) ×1 IMPLANT
PATTIES SURGICAL .5 X1 (DISPOSABLE) IMPLANT
PIN DISTRACTION 14 (PIN) IMPLANT
PLATE SKYLINE 3LVL 45MM CERV (Plate) IMPLANT
POSITIONER HEAD DONUT 9IN (MISCELLANEOUS) ×1 IMPLANT
SCREW SKYLINE VAR OS 14MM (Screw) IMPLANT
SPONGE INTESTINAL PEANUT (DISPOSABLE) ×1 IMPLANT
SPONGE SURGIFOAM ABS GEL 100 (HEMOSTASIS) IMPLANT
STRIP CLOSURE SKIN 1/2X4 (GAUZE/BANDAGES/DRESSINGS) ×1 IMPLANT
SURGIFLO W/THROMBIN 8M KIT (HEMOSTASIS) IMPLANT
SUT MNCRL AB 4-0 PS2 18 (SUTURE) ×1 IMPLANT
SUT SILK 4-0 18XBRD TIE 12 (SUTURE) IMPLANT
SUT VIC AB 2-0 CT2 18 VCP726D (SUTURE) ×1 IMPLANT
SYR BULB IRRIG 60ML STRL (SYRINGE) ×1 IMPLANT
SYR CONTROL 10ML LL (SYRINGE) ×1 IMPLANT
TAPE CLOTH 4X10 WHT NS (GAUZE/BANDAGES/DRESSINGS) ×1 IMPLANT
TAPE UMBILICAL 1/8X30 (MISCELLANEOUS) ×1 IMPLANT
TOWEL GREEN STERILE (TOWEL DISPOSABLE) ×1 IMPLANT
TOWEL GREEN STERILE FF (TOWEL DISPOSABLE) ×1 IMPLANT
TRAY FOLEY MTR SLVR 16FR STAT (SET/KITS/TRAYS/PACK) IMPLANT
WATER STERILE IRR 1000ML POUR (IV SOLUTION) ×1 IMPLANT
YANKAUER SUCT BULB TIP NO VENT (SUCTIONS) ×1 IMPLANT

## 2023-11-29 NOTE — H&P (Signed)
 PREOPERATIVE H&P  Chief Complaint: Left arm pain  HPI: [name] is a 73 y.o. female who presents with ongoing pain in the left arm  MRI reveals spinal stenosis involving C5-6, C6-7, and C7-T1  Patient has failed multiple forms of conservative care and continues to have pain (see office notes for additional details regarding the patient's full course of treatment)  Past Medical History:  Diagnosis Date   A-fib (HCC)    Anemia    hx of   Arthritis    Chest pain    Constipation    Depression    Depression    Diabetes mellitus ORAL MEDS   Diabetic retinopathy (HCC)    Diabetic retinopathy (HCC)    Dyspnea    with minimal exertion-deconditioned   Dyspnea    Dysrhythmia    a-fib   Food allergy    GERD (gastroesophageal reflux disease)    occasional   Gout    Heartburn    History of kidney stones    Hypercholesteremia    Hyperlipidemia    Hypertension    Hypertensive kidney disease    Hypothyroidism    Joint pain    Kidney problem    Knee pain, right    Left arm numbness DUE TO CERVICAL PINCHED NERVE   Obesity    OSA on CPAP    uses CPAP nightly   Osteoarthritis    Osteoarthritis    Palpitations    Pinched nerve in neck    Pleurisy    Pneumonia    hx of   PONV (postoperative nausea and vomiting)    for 3-4 days after general anesthesia approx 10-12 years ago   Sciatica    Shortness of breath    Sleep apnea    Spinal stenosis    Swelling of both lower extremities    Swelling of knee joint, right    Synovitis of knee RIGHT   Trigger finger, left    left index   Vitamin D  deficiency    Past Surgical History:  Procedure Laterality Date   CESAREAN SECTION  X3   KNEE ARTHROSCOPY  04/20/2011   Procedure: ARTHROSCOPY KNEE;  Surgeon: Dempsey LULLA Moan, MD;  Location: [facility] [place];  Service: Orthopedics;  Laterality: Right;  WITH SYNOVECTOMY   LEFT CARPAL TUNNEL / LEFT MIDDLE & RING FINGER TRIGGER RELEASE  08-26-2008   LEFT SHOULDER  ARTHROSCOPY W/ DEBRIDEMENT  09-09-2003   LEFT SHOULDER ARTHROSCOPY/ LEFT THUMB TRIGGER RELEASE  02-22-2005   PHOTOCOAGULATION WITH LASER Right 03/20/2018   Procedure: PHOTOCOAGULATION WITH LASER;  Surgeon: Tobie Baptist, MD;  Location: [facility] OR;  Service: Ophthalmology;  Laterality: Right;   PULLEY RELEASE LEFT LONG FINGER  07-14-2009   RADIAL HEAD ARTHROPLASTY Right 06/17/2018   Procedure: RADIAL HEAD ARTHROPLASTY;  Surgeon: Cristy Bonner DASEN, MD;  Location: [facility] OR;  Service: Orthopedics;  Laterality: Right;   RIGHT CARPAL TUNNEL/ RIGHT THUMB TRIGGER RELEASE'S  11-28-2006   RIGHT SHOULDER ARTHROSCOPY W/ ROTATOR CUFF REPAIR  01-13-2004   SHOULDER ARTHROSCOPY DISTAL CLAVICLE EXCISION AND OPEN ROTATOR CUFF REPAIR  09-07-2004   LEFT   SHOULDER ARTHROSCOPY W/ ACROMIAL REPAIR  11-29-2005   LEFT   SYNOVECTOMY Left 06/20/2022   Procedure: left index finger and left long finger metacarpal phalangel joint syonvectomy;  Surgeon: Sissy Cough, MD;  Location: [facility] OR;  Service: Orthopedics;  Laterality: Left;  needs 1 hour of time..   TONSILLECTOMY AND ADENOIDECTOMY  child   TOTAL KNEE ARTHROPLASTY  12-14-2009   RIGHT   TOTAL KNEE ARTHROPLASTY Left 11/05/2012   Procedure: LEFT TOTAL KNEE ARTHROPLASTY;  Surgeon: Dempsey LULLA Moan, MD;  Location: [facility];  Service: Orthopedics;  Laterality: Left;   TOTAL KNEE REVISION Right 07/29/2016   Procedure: RIGHT KNEE POLY-LINER EXCHANGE;  Surgeon: Vernetta Lonni GRADE, MD;  Location: [facility];  Service: Orthopedics;  Laterality: Right;   TRIGGER FINGER RELEASE Right 02/21/2013   Procedure: RIGHT RING A-1 PULLEY RELEASE    (MINOR PROCEDURE) ;  Surgeon: Lamar LULLA Leonor Mickey., MD;  Location: [facility];  Service: Orthopedics;  Laterality: Right;   TRIGGER FINGER RELEASE Left 09/22/2016   Procedure: RELEASE TRIGGER FINGER LEFT INDEX FINGER;  Surgeon: Vernetta Lonni GRADE, MD;  Location: [facility] OR;  Service: Orthopedics;  Laterality: Left;   TRIGGER FINGER RELEASE Right  01/20/2017   Procedure: RELEASE TRIGGER FINGER/A-1 PULLEY RIGHT INDEX FINGER;  Surgeon: Sheril Coy, MD;  Location: [facility];  Service: Orthopedics;  Laterality: Right;   VITRECTOMY 25 GAUGE WITH SCLERAL BUCKLE Right 03/20/2018   Procedure: RIGHT EYE VITRECTOMY WITH  ENDOLASER PARENTAL PHOTOCOAGULATION 25 GAUGE;  Surgeon: Tobie Baptist, MD;  Location: [facility] OR;  Service: Ophthalmology;  Laterality: Right;   WRIST ARTHROSCOPY WITH DEBRIDEMENT Right 05/01/2019   Procedure: WRIST ARTHROSCOPY WITH DEBRIDEMENT;  Surgeon: Sissy Cough, MD;  Location: [facility];  Service: Orthopedics;  Laterality: Right;   Social History   Socioeconomic History   Marital status: Single    Spouse name: Not on file   Number of children: Not on file   Years of education: Not on file   Highest education level: Not on file  Occupational History   Not on file  Tobacco Use   Smoking status: Former    Current packs/day: 0.00    Types: Cigarettes    Start date: 04/13/1967    Quit date: 04/12/1972    Years since quitting: 51.6   Smokeless tobacco: Never   Tobacco comments:    Former smoker 10/22/21  Vaping Use   Vaping status: Never Used  Substance and Sexual Activity   Alcohol use: No   Drug use: No   Sexual activity: Not on file  Other Topics Concern   Not on file  Social History Narrative   Not on file   Social Drivers of Health   Financial Resource Strain: Not on file  Food Insecurity: Not on file  Transportation Needs: Not on file  Physical Activity: Not on file  Stress: Not on file  Social Connections: Not on file   Family History  Problem Relation Age of Onset   Diabetes Mother    Hypertension Mother    Heart attack Mother    Thyroid  disease Mother    Obesity Mother    Cancer Father    Liver disease Father    Sleep apnea Father    Alcoholism Father    Allergies  Allergen Reactions   Actos [Pioglitazone Hydrochloride] Swelling and Other (See  Comments)    Numbness in lips, HEADACHES SWELLING REACTION UNSPECIFIED    Avandia [Rosiglitazone] Swelling    Numbness in lips, headaches  SWELLING REACTION UNSPECIFIED    Tetanus Toxoid-Containing Vaccines Other (See Comments)    Arm swelling, fainted, ?Respiratory distress  (horse serum)   Food Nausea Only    Eggplant   Prior to Admission medications   Medication Sig Start Date End Date Taking? Authorizing Provider  Aflibercept  (EYLEA ) 2 MG/0.05ML SOLN Place 2 mg into both eyes  See admin instructions. Every six weeks 09/03/20  Yes [provider]  amLODipine  (NORVASC ) 5 MG tablet Take 1 tablet (5 mg total) by mouth daily. Patient need to schedule annual appointment with Dr.Nishan 11/08/23  Yes Delford Maude BROCKS, MD  amoxicillin  (AMOXIL ) 500 MG capsule Take 4 capsules (2,000 mg total) by mouth 1 hour prior to dental appointment 10/31/22  Yes   atorvastatin  (LIPITOR) 40 MG tablet Take 1 tablet (40 mg total) by mouth daily. 12/07/22  Yes   Cholecalciferol  (VITAMIN D ) 50 MCG (2000 UT) tablet Take 2,000 Units by mouth daily.   Yes [provider]  cyclobenzaprine  (FLEXERIL ) 5 MG tablet Take 1-2 tablets (5-10 mg total) by mouth at bedtime as needed. 11/08/23  Yes   Dapagliflozin  Pro-metFORMIN  ER (XIGDUO  XR) 12-998 MG TB24 Take 1 tablet by mouth daily. 04/11/23  Yes   Dulaglutide  (TRULICITY ) 4.5 MG/0.5ML SOAJ Inject 4.5 mg into the skin once a week. 10/16/23  Yes   DULoxetine  (CYMBALTA ) 60 MG capsule Take 1 capsule (60 mg total) by mouth daily. 08/13/23  Yes   flecainide  (TAMBOCOR ) 100 MG tablet Take 1 tablet (100 mg total) by mouth 2 (two) times daily. 07/06/23  Yes Delford Maude BROCKS, MD  hydrALAZINE  (APRESOLINE ) 25 MG tablet Take 1 tablet (25 mg total) by mouth 2 (two) times daily with breakfast and dinner. 11/07/23  Yes   ibuprofen (ADVIL) 200 MG tablet Take 200 mg by mouth every 6 (six) hours as needed for moderate pain.   Yes [provider]  indomethacin  (INDOCIN ) 50 MG capsule  Take 1 capsule (50 mg total) by mouth 2-3 times a day as needed for gout pain 05/23/23  Yes   Insulin  Glargine (BASAGLAR  KWIKPEN) 100 UNIT/ML Inject 60 Units into the skin 2 (two) times daily. Patient taking differently: Inject 60 Units into the skin 2 (two) times daily. Has been taking 54/55 units 07/17/23  Yes   levothyroxine  (SYNTHROID ) 137 MCG tablet Take 1 tablet by mouth once daily in the morning on an empty stomach 04/07/23  Yes   losartan  (COZAAR ) 50 MG tablet Take 1 tablet (50 mg total) by mouth daily. 12/12/22  Yes   Melatonin 10 MG CAPS Take 10 mg by mouth at bedtime as needed (sleep).   Yes [provider]  metoprolol  tartrate (LOPRESSOR ) 50 MG tablet Take 1 tablet (50 mg total) by mouth 2 (two) times daily. 07/06/23  Yes Delford Maude BROCKS, MD  Multiple Vitamins-Minerals (EQ VISION FORMULA 50+) CAPS Take 1 capsule by mouth daily.   Yes [provider]  omeprazole  (PRILOSEC) 20 MG capsule Take 1 capsule (20 mg total) by mouth daily. 08/13/23  Yes   pregabalin  (LYRICA ) 150 MG capsule Take 1 capsule (150 mg total) by mouth 2 (two) times daily as needed for nerve pain 09/11/23  Yes   rivaroxaban  (XARELTO ) 20 MG TABS tablet Take 1 tablet (20 mg total) by mouth daily with supper. 11/20/23  Yes Delford Maude BROCKS, MD  spironolactone  (ALDACTONE ) 25 MG tablet Take 0.5 tablets (12.5 mg total) by mouth every other day. 07/06/23  Yes Delford Maude BROCKS, MD  Continuous Blood Gluc Receiver (FREESTYLE LIBRE 3 READER) DEVI Use to check blood sugar 06/03/22   Faythe Purchase, MD  Continuous Glucose Sensor (FREESTYLE LIBRE 3 SENSOR) MISC Use as directed to check blood sugar. Change every 14 days 06/03/22   Faythe Purchase, MD  Continuous Glucose Sensor (FREESTYLE LIBRE 3 SENSOR) MISC Use as directed. Change every 14 days. 07/31/23  Dulaglutide  (TRULICITY ) 4.5 MG/0.5ML SOAJ Inject 4.5 mg into the skin once a week. Patient not taking: Reported on 11/22/2023 01/09/23     glucose blood test strip Use to test blood  sugar 3 times a day as directed 05/18/22     Insulin  Pen Needle 32G X 4 MM MISC Use to administer insulin  twice daily. 05/15/23   Faythe Purchase, MD  methylPREDNISolone  (MEDROL ) 4 MG TBPK tablet Take as prescribed on dose pack over 6 days 11/24/23     pregabalin  (LYRICA ) 50 MG capsule Take 1 capsule by mouth twice a day Patient not taking: Reported on 11/22/2023 07/28/23        All other systems have been reviewed and were otherwise negative with the exception of those mentioned in the HPI and as above.  Physical Exam: Vitals:   11/29/23 1127  BP: (!) 161/76  Pulse: 66  Resp: 19  Temp: 98.5 F (36.9 C)  SpO2: 94%    Body mass index is 48.82 kg/m.  General: Alert, no acute distress Cardiovascular: No pedal edema Respiratory: No cyanosis, no use of accessory musculature Skin: No lesions in the area of chief complaint Neurologic: Sensation intact distally Psychiatric: Patient is competent for consent with normal mood and affect Lymphatic: No axillary or cervical lymphadenopathy  Assessment/Plan: CERVICAL RADICULOPATHY Plan for Procedure(s): ANTERIOR CERVICAL DECOMPRESSION/DISCECTOMY FUSION 3 LEVELS   Oneil LITTIE Priestly, MD 11/29/2023 11:42 AM

## 2023-11-29 NOTE — Anesthesia Procedure Notes (Signed)
 Procedure Name: Intubation Date/Time: 11/29/2023 1:05 PM  Performed by: Obadiah Reyes BROCKS, CRNAPre-anesthesia Checklist: Patient identified, Emergency Drugs available, Suction available and Patient being monitored Patient Re-evaluated:Patient Re-evaluated prior to induction Oxygen Delivery Method: Circle System Utilized Preoxygenation: Pre-oxygenation with 100% oxygen Induction Type: IV induction Ventilation: Mask ventilation without difficulty Laryngoscope Size: Glidescope and 3 Grade View: Grade III Tube type: Oral Tube size: 7.0 mm Number of attempts: 1 Airway Equipment and Method: Stylet and Oral airway Placement Confirmation: ETT inserted through vocal cords under direct vision, positive ETCO2 and breath sounds checked- equal and bilateral Secured at: 21 cm Tube secured with: Tape Dental Injury: Teeth and Oropharynx as per pre-operative assessment  Difficulty Due To: Difficult Airway- due to reduced neck mobility

## 2023-11-29 NOTE — Anesthesia Procedure Notes (Signed)
 Arterial Line Insertion Start/End9/17/2025 12:30 PM Performed by: CRNA  Patient location: Pre-op. Preanesthetic checklist: patient identified, IV checked, site marked, risks and benefits discussed, surgical consent, monitors and equipment checked, pre-op evaluation, timeout performed and anesthesia consent Lidocaine  1% used for infiltration Left, radial was placed Catheter size: 20 G Hand hygiene performed  and maximum sterile barriers used   Attempts: 1 Procedure performed without using ultrasound guided technique. Following insertion, dressing applied and Biopatch. Post procedure assessment: normal and unchanged  Patient tolerated the procedure well with no immediate complications.

## 2023-11-29 NOTE — Progress Notes (Signed)
    Patient underwent uneventful 3 level ACDF. Pt body habitus was the only complication. She is expecting to D/C home today, admit orders for insurance reasons only, expectation is to D/C home from PACU today  Ileana Clara, PA-C

## 2023-11-29 NOTE — Transfer of Care (Signed)
 Immediate Anesthesia Transfer of Care Note  Patient: [name]  Procedure(s) Performed: ANTERIOR CERVICAL DECOMPRESSION/DISCECTOMY FUSION CERVICAL FIVE-CERVICAL SIX, CERVICAL SIX- CERVICAL SEVEN,CERVICAL SEVEN- THORACIC ONE WITH INSTRUMENTATION AND ALLOGRAFT (Spine Cervical)  Patient Location: PACU  Anesthesia Type:General  Level of Consciousness: awake, alert , and oriented  Airway & Oxygen Therapy: Patient Spontanous Breathing and Patient connected to face mask oxygen  Post-op Assessment: Report given to RN and Post -op Vital signs reviewed and stable  Post vital signs: Reviewed and stable  Last Vitals:  Vitals Value Taken Time  BP 166/82 11/29/23 16:30  Temp 36.8 C 11/29/23 16:27  Pulse 65 11/29/23 16:31  Resp 24 11/29/23 16:31  SpO2 97 % 11/29/23 16:31  Vitals shown include unfiled device data.  Last Pain:  Vitals:   11/29/23 1151  TempSrc:   PainSc: 4       Patients Stated Pain Goal: 1 (11/29/23 1151)  Complications: No notable events documented.

## 2023-11-29 NOTE — Op Note (Signed)
 PATIENT NAME: [name]   MEDICAL RECORD NO.:   [id number]    DATE OF BIRTH: 15-Aug-1950   DATE OF PROCEDURE: 11/29/2023                               OPERATIVE REPORT     PREOPERATIVE DIAGNOSES: 1. Left-sided cervical radiculopathy 2. Spinal stenosis spanning C5-T1   POSTOPERATIVE DIAGNOSES: 1. Left-sided cervical radiculopathy 2. Spinal stenosis spanning C5-T1   PROCEDURE: 1. Anterior cervical decompression and fusion C5/6, C6/7, C7/T1 (Of note, the anterior plate and screws, were separate from, and not integral to, the intervertebral spacers) 2. Placement of anterior instrumentation, C5-T1 3. Insertion of interbody device x 3 (Titan intervertebral spacers). 4. Intraoperative use of fluoroscopy. 5. Use of morselized allograft - ViviGen.   SURGEON:  Oneil Priestly, MD   ASSISTANT:  Ileana Clara, PA-C.   ANESTHESIA:  General endotracheal anesthesia.   COMPLICATIONS:  None.   DISPOSITION:  Stable.   ESTIMATED BLOOD LOSS:  Minimal.   INDICATIONS FOR SURGERY:  Briefly, Ms. Fukushima is a pleasant 73 y.o. year- old patient, who did present to me with ongoing pain and numbness involving the left arm.  The patient's MRI did reveal the findings noted above.  Given the patient's ongoing pain and lack of improvement with appropriate treatment measures, we did discuss proceeding with the procedure noted above.  The patient was fully aware of the risks and limitations of surgery as outlined in my preoperative note.   OPERATIVE DETAILS:  On 11/29/2023, the patient was brought to surgery and general endotracheal anesthesia was administered.  The patient was placed supine on the hospital bed. The neck was gently extended.  All bony prominences were meticulously padded.  The neck was prepped and draped in the usual sterile fashion.  At this point, I did make a left-sided transverse incision.  The platysma was incised.  A Smith-Robinson approach was used and the anterior spine was  identified. A self-retaining retractor was placed.  I then subperiosteally exposed the vertebral bodies from C5-T1.  Caspar pins were then placed into the C7 and T1 vertebral bodies and distraction was applied.  Of note, given the patient's body habitus, the C7-T1 level was very difficult to visualize.  I was however able to obtain a reasonable angle for the discectomy.  In the very posterior aspect of the intervertebral disc space, there was a disc osteophyte complex, which was adherent to the dura.  I did make multiple attempts to tease away the osteophyte complex and the dura and develop a plane, however, I did feel that additional attempts would bring with that risk of durotomy or neurologic injury, so I therefore did not make additional attempts.  I was however pleased with the discectomy. decompressed, as was the right and left neuroforamen.  The endplates were then prepared and the appropriate-sized intervertebral spacer was then packed with ViviGen and tamped into position in the usual fashion.  I did use a 7 mm implant, which did also allow indirect decompression.  The lower Caspar pin was then removed and placed into the C6 vertebral body and once again, distraction was applied across the C6-7 intervertebral space.  I then again performed a thorough and complete diskectomy, thoroughly decompressing the spinal canal and bilateral neuroforamena.  After preparing the endplates, the appropriate-sized intervertebral spacer was packed with ViviGen and tamped into position.  The lower Caspar pin was then removed and placed into  the C5 vertebral body and once again, distraction was applied across the C5-6 intervertebral space.  I then again performed a thorough and complete diskectomy, thoroughly decompressing the spinal canal and bilateral neuroforamena.  After preparing the endplates, the appropriate-sized intervertebral spacer was packed with ViviGen and tamped into position.  The Caspar pins  then were removed and bone wax was placed in their place.  The appropriate-sized anterior cervical plate was placed over the anterior spine.  14 mm variable angle screws were placed, 2 in each vertebral body from C5-T1 for a total of 8 vertebral body screws.  The screws were then locked to the plate using the Cam locking mechanism.  I was very pleased with the final fluoroscopic images.  The wound was then irrigated.  The wound was then explored for any undue bleeding and there was no bleeding noted. The wound was then closed in layers using 2-0 Vicryl, followed by 4-0 Monocryl.  Benzoin and Steri-Strips were applied, followed by sterile dressing.  All instrument counts were correct at the termination of the procedure.   Of note, Ileana Clara, PA-C, was my assistant throughout surgery, and did aid in retraction, suctioning, placement of the hardware, and closure from start to finish.     Oneil Priestly, MD

## 2023-11-30 NOTE — Anesthesia Postprocedure Evaluation (Signed)
 Anesthesia Post Note  Patient: [name]  Procedure(s) Performed: ANTERIOR CERVICAL DECOMPRESSION/DISCECTOMY FUSION CERVICAL FIVE-CERVICAL SIX, CERVICAL SIX- CERVICAL SEVEN,CERVICAL SEVEN- THORACIC ONE WITH INSTRUMENTATION AND ALLOGRAFT (Spine Cervical)     Patient location during evaluation: PACU Anesthesia Type: General Level of consciousness: awake and alert Pain management: pain level controlled Vital Signs Assessment: post-procedure vital signs reviewed and stable Respiratory status: spontaneous breathing, nonlabored ventilation, respiratory function stable and patient connected to nasal cannula oxygen Cardiovascular status: blood pressure returned to baseline and stable Postop Assessment: no apparent nausea or vomiting Anesthetic complications: no   No notable events documented.  Last Vitals:  Vitals:   11/29/23 1819 11/29/23 1830  BP:    Pulse:    Resp:    Temp:    SpO2: 92% 95%    Last Pain:  Vitals:   11/29/23 1830  TempSrc:   PainSc: 0-No pain                 Dion[address] S

## 2023-12-01 ENCOUNTER — Encounter (HOSPITAL_COMMUNITY): Payer: Self-pay | Admitting: Orthopedic Surgery

## 2023-12-05 ENCOUNTER — Other Ambulatory Visit: Payer: Self-pay | Admitting: Cardiovascular Disease

## 2023-12-05 ENCOUNTER — Other Ambulatory Visit (HOSPITAL_COMMUNITY): Payer: Self-pay

## 2023-12-06 ENCOUNTER — Other Ambulatory Visit (HOSPITAL_COMMUNITY): Payer: Self-pay

## 2023-12-06 ENCOUNTER — Other Ambulatory Visit: Payer: Self-pay

## 2023-12-06 MED ORDER — ATORVASTATIN CALCIUM 40 MG PO TABS
40.0000 mg | ORAL_TABLET | Freq: Every day | ORAL | 3 refills | Status: AC
  Filled 2023-12-06: qty 90, 90d supply, fill #0
  Filled 2024-03-08: qty 90, 90d supply, fill #1

## 2023-12-06 MED ORDER — CYCLOBENZAPRINE HCL 5 MG PO TABS
5.0000 mg | ORAL_TABLET | Freq: Every evening | ORAL | 0 refills | Status: AC | PRN
  Filled 2023-12-06: qty 60, 30d supply, fill #0

## 2023-12-06 MED ORDER — FREESTYLE LIBRE 3 SENSOR MISC
4 refills | Status: AC
  Filled 2023-12-06 – 2024-03-08 (×2): qty 6, 84d supply, fill #0

## 2023-12-06 MED ORDER — AMLODIPINE BESYLATE 5 MG PO TABS
5.0000 mg | ORAL_TABLET | Freq: Every day | ORAL | 1 refills | Status: AC
  Filled 2023-12-06 – 2024-01-28 (×3): qty 90, 90d supply, fill #0

## 2023-12-06 MED ORDER — INDOMETHACIN 50 MG PO CAPS
50.0000 mg | ORAL_CAPSULE | Freq: Three times a day (TID) | ORAL | 0 refills | Status: DC
  Filled 2023-12-06: qty 20, 7d supply, fill #0

## 2023-12-06 NOTE — Discharge Summary (Signed)
 Patient ID: [name] MRN: [id number] DOB/AGE: [birth year] 73 y.o.  Admit date: 11/29/2023 Discharge date: 11/29/2023  Admission Diagnoses:  Principal Problem:   Cervical disc disorder with radiculopathy of cervical region Active Problems:   Cervical radiculopathy   Discharge Diagnoses:  Same  Past Medical History:  Diagnosis Date   A-fib (HCC)    Anemia    hx of   Arthritis    Chest pain    Constipation    Depression    Depression    Diabetes mellitus ORAL MEDS   Diabetic retinopathy (HCC)    Diabetic retinopathy (HCC)    Dyspnea    with minimal exertion-deconditioned   Dyspnea    Dysrhythmia    a-fib   Food allergy    GERD (gastroesophageal reflux disease)    occasional   Gout    Heartburn    History of kidney stones    Hypercholesteremia    Hyperlipidemia    Hypertension    Hypertensive kidney disease    Hypothyroidism    Joint pain    Kidney problem    Knee pain, right    Left arm numbness DUE TO CERVICAL PINCHED NERVE   Obesity    OSA on CPAP    uses CPAP nightly   Osteoarthritis    Osteoarthritis    Palpitations    Pinched nerve in neck    Pleurisy    Pneumonia    hx of   PONV (postoperative nausea and vomiting)    for 3-4 days after general anesthesia approx 10-12 years ago   Sciatica    Shortness of breath    Sleep apnea    Spinal stenosis    Swelling of both lower extremities    Swelling of knee joint, right    Synovitis of knee RIGHT   Trigger finger, left    left index   Vitamin D  deficiency     Surgeries: Procedure(s): ANTERIOR CERVICAL DECOMPRESSION/DISCECTOMY FUSION CERVICAL FIVE-CERVICAL SIX, CERVICAL SIX- CERVICAL SEVEN,CERVICAL SEVEN- THORACIC ONE WITH INSTRUMENTATION AND ALLOGRAFT on 11/29/2023   Consultants: None  Discharged Condition: Improved  Hospital Course: [name] is an 73 y.o. female who was admitted 11/29/2023 for operative treatment of Cervical disc disorder with radiculopathy of cervical region.  Patient has severe unremitting pain that affects sleep, daily activities, and work/hobbies. After pre-op clearance the patient was taken to the operating room on 11/29/2023 and underwent  Procedure(s): ANTERIOR CERVICAL DECOMPRESSION/DISCECTOMY FUSION CERVICAL FIVE-CERVICAL SIX, CERVICAL SIX- CERVICAL SEVEN,CERVICAL SEVEN- THORACIC ONE WITH INSTRUMENTATION AND ALLOGRAFT.    Patient was given perioperative antibiotics:  Anti-infectives (From admission, onward)    Start     Dose/Rate Route Frequency Ordered Stop   11/29/23 1145  ceFAZolin  (ANCEF ) IVPB 2g/100 mL premix        2 g 200 mL/hr over 30 Minutes Intravenous On call to O.R. 11/29/23 1135 11/29/23 1329        Patient was given sequential compression devices, early ambulation to prevent DVT.  Patient benefited maximally from hospital stay and there were no complications.    Recent vital signs: BP (!) 166/88 (BP Location: Left Arm)   Pulse 65   Temp 98.3 F (36.8 C)   Resp 15   Ht 5' (1.524 m)   Wt 113.4 kg   SpO2 95%   BMI 48.82 kg/m    Discharge Medications:   Allergies as of 11/29/2023       Reactions   Actos [pioglitazone Hydrochloride] Swelling, Other (  See Comments)   Numbness in lips, HEADACHES SWELLING REACTION UNSPECIFIED    Avandia [rosiglitazone] Swelling   Numbness in lips, headaches  SWELLING REACTION UNSPECIFIED    Tetanus Toxoid-containing Vaccines Other (See Comments)   Arm swelling, fainted, ?Respiratory distress  (horse serum)   Food Nausea Only   Eggplant        Medication List     STOP taking these medications    amoxicillin  500 MG capsule Commonly known as: AMOXIL        TAKE these medications    amLODipine  5 MG tablet Commonly known as: NORVASC  Take 1 tablet (5 mg total) by mouth daily. Patient need to schedule annual appointment with Dr.Nishan   Basaglar  KwikPen 100 UNIT/ML Inject 60 Units into the skin 2 (two) times daily. What changed: additional instructions    cyclobenzaprine  5 MG tablet Commonly known as: FLEXERIL  Take 1-2 tablets (5-10 mg total) by mouth at bedtime as needed.   DULoxetine  60 MG capsule Commonly known as: CYMBALTA  Take 1 capsule (60 mg total) by mouth daily.   EQ Vision Formula 50+ Caps Take 1 capsule by mouth daily.   Eylea  2 MG/0.05ML Soln Generic drug: Aflibercept  Place 2 mg into both eyes See admin instructions. Every six weeks   flecainide  100 MG tablet Commonly known as: TAMBOCOR  Take 1 tablet (100 mg total) by mouth 2 (two) times daily.   FreeStyle Libre 3 Reader Devi Use to check blood sugar   FreeStyle Libre 3 Sensor Misc Use as directed to check blood sugar. Change every 14 days   FreeStyle Libre 3 Sensor Misc Use as directed. Change every 14 days.   FREESTYLE LITE test strip Generic drug: glucose blood Use to test blood sugar 3 times a day as directed   hydrALAZINE  25 MG tablet Commonly known as: APRESOLINE  Take 1 tablet (25 mg total) by mouth 2 (two) times daily with breakfast and dinner.   HYDROcodone -acetaminophen  5-325 MG tablet Commonly known as: NORCO/VICODIN Take 1-2 tablets by mouth every 6 (six) hours as needed for moderate pain (pain score 4-6) or severe pain (pain score 7-10).   indomethacin  50 MG capsule Commonly known as: INDOCIN  Take 1 capsule (50 mg total) by mouth 2-3 times a day as needed for gout pain   Insupen Pen Needles 32G X 4 MM Misc Generic drug: Insulin  Pen Needle Use to administer insulin  twice daily.   levothyroxine  137 MCG tablet Commonly known as: SYNTHROID  Take 1 tablet by mouth once daily in the morning on an empty stomach   losartan  50 MG tablet Commonly known as: COZAAR  Take 1 tablet (50 mg total) by mouth daily.   Melatonin 10 MG Caps Take 10 mg by mouth at bedtime as needed (sleep).   methocarbamol  500 MG tablet Commonly known as: ROBAXIN  Take 1-2 tablets (500-1,000 mg total) by mouth 4 (four) times daily.   metoprolol  tartrate 50 MG  tablet Commonly known as: LOPRESSOR  Take 1 tablet (50 mg total) by mouth 2 (two) times daily.   omeprazole  20 MG capsule Commonly known as: PRILOSEC Take 1 capsule (20 mg total) by mouth daily.   pregabalin  150 MG capsule Commonly known as: Lyrica  Take 1 capsule (150 mg total) by mouth 2 (two) times daily as needed for nerve pain What changed: Another medication with the same name was removed. Continue taking this medication, and follow the directions you see here.   spironolactone  25 MG tablet Commonly known as: ALDACTONE  Take 0.5 tablets (12.5 mg total) by mouth every  other day.   Trulicity  4.5 MG/0.5ML Soaj Generic drug: Dulaglutide  Inject 4.5 mg into the skin once a week.   Trulicity  4.5 MG/0.5ML Soaj Generic drug: Dulaglutide  Inject 4.5 mg into the skin once a week.   Vitamin D  50 MCG (2000 UT) tablet Take 2,000 Units by mouth daily.   Xarelto  20 MG Tabs tablet Generic drug: rivaroxaban  Take 1 tablet (20 mg total) by mouth daily with supper.   Xigduo  XR 12-998 MG Tb24 Generic drug: Dapagliflozin  Pro-metFORMIN  ER Take 1 tablet by mouth daily.        Diagnostic Studies: DG Cervical Spine 1 View Result Date: 11/29/2023 CLINICAL DATA:  [id number] Surgery, elective [id number] EXAM: DG CERVICAL SPINE - 1 VIEW COMPARISON:  None Available. FINDINGS: Four fluoroscopic spot views of the cervical spine submitted from the operating room. Anterior fusion with interbody spacers at 4 contiguous levels, difficult to delineate due to coned views. Fluoroscopy time 19.5 seconds. Dose 13.8 mGy. IMPRESSION: Intraoperative fluoroscopy during cervical spine surgery. Electronically Signed   By: Andrea Gasman [name]   On: 11/29/2023 16:39   DG C-Arm 1-60 Min-No Report Result Date: 11/29/2023 Fluoroscopy was utilized by the requesting physician.  No radiographic interpretation.   DG C-Arm 1-60 Min-No Report Result Date: 11/29/2023 Fluoroscopy was utilized by the requesting physician.  No  radiographic interpretation.   DG C-Arm 1-60 Min-No Report Result Date: 11/29/2023 Fluoroscopy was utilized by the requesting physician.  No radiographic interpretation.    Disposition: Discharge disposition: 01-Home or Self Care       Pt doing excellent and wishes to D/C home today   -Scripts for pain sent to pharmacy electronically  -D/C instructions sheet printed and in chart -D/C today  -F/U in office 2 weeks   Signed: Ileana JINNY Clara 12/06/2023, 10:53 AM

## 2023-12-07 ENCOUNTER — Other Ambulatory Visit (HOSPITAL_COMMUNITY): Payer: Self-pay

## 2023-12-09 ENCOUNTER — Other Ambulatory Visit (HOSPITAL_COMMUNITY): Payer: Self-pay

## 2023-12-11 DIAGNOSIS — G4733 Obstructive sleep apnea (adult) (pediatric): Secondary | ICD-10-CM | POA: Diagnosis not present

## 2023-12-12 ENCOUNTER — Other Ambulatory Visit (HOSPITAL_COMMUNITY): Payer: Self-pay

## 2023-12-12 ENCOUNTER — Ambulatory Visit (INDEPENDENT_AMBULATORY_CARE_PROVIDER_SITE_OTHER): Admitting: Family Medicine

## 2023-12-13 ENCOUNTER — Other Ambulatory Visit (HOSPITAL_COMMUNITY): Payer: Self-pay

## 2023-12-13 DIAGNOSIS — M4803 Spinal stenosis, cervicothoracic region: Secondary | ICD-10-CM | POA: Diagnosis not present

## 2023-12-13 MED ORDER — HYDROCODONE-ACETAMINOPHEN 5-325 MG PO TABS
1.0000 | ORAL_TABLET | ORAL | 0 refills | Status: AC
  Filled 2023-12-13: qty 60, 15d supply, fill #0

## 2023-12-13 MED ORDER — METHOCARBAMOL 500 MG PO TABS
ORAL_TABLET | ORAL | 2 refills | Status: AC
  Filled 2023-12-13: qty 60, 5d supply, fill #0
  Filled 2023-12-29: qty 60, 5d supply, fill #1
  Filled 2024-01-06: qty 60, 5d supply, fill #2

## 2023-12-15 ENCOUNTER — Other Ambulatory Visit: Payer: Self-pay

## 2023-12-15 ENCOUNTER — Other Ambulatory Visit (HOSPITAL_COMMUNITY): Payer: Self-pay

## 2023-12-19 DIAGNOSIS — Z794 Long term (current) use of insulin: Secondary | ICD-10-CM | POA: Diagnosis not present

## 2023-12-19 DIAGNOSIS — E039 Hypothyroidism, unspecified: Secondary | ICD-10-CM | POA: Diagnosis not present

## 2023-12-19 DIAGNOSIS — E7800 Pure hypercholesterolemia, unspecified: Secondary | ICD-10-CM | POA: Diagnosis not present

## 2023-12-19 DIAGNOSIS — Z79899 Other long term (current) drug therapy: Secondary | ICD-10-CM | POA: Diagnosis not present

## 2023-12-19 DIAGNOSIS — E119 Type 2 diabetes mellitus without complications: Secondary | ICD-10-CM | POA: Diagnosis not present

## 2023-12-19 DIAGNOSIS — M85851 Other specified disorders of bone density and structure, right thigh: Secondary | ICD-10-CM | POA: Diagnosis not present

## 2023-12-19 DIAGNOSIS — M109 Gout, unspecified: Secondary | ICD-10-CM | POA: Diagnosis not present

## 2023-12-26 DIAGNOSIS — E113512 Type 2 diabetes mellitus with proliferative diabetic retinopathy with macular edema, left eye: Secondary | ICD-10-CM | POA: Diagnosis not present

## 2023-12-27 DIAGNOSIS — S8002XA Contusion of left knee, initial encounter: Secondary | ICD-10-CM | POA: Diagnosis not present

## 2023-12-28 ENCOUNTER — Other Ambulatory Visit (HOSPITAL_COMMUNITY): Payer: Self-pay

## 2023-12-29 ENCOUNTER — Other Ambulatory Visit (HOSPITAL_COMMUNITY): Payer: Self-pay

## 2023-12-29 DIAGNOSIS — H9221 Otorrhagia, right ear: Secondary | ICD-10-CM | POA: Diagnosis not present

## 2023-12-30 ENCOUNTER — Other Ambulatory Visit (HOSPITAL_COMMUNITY): Payer: Self-pay

## 2024-01-01 ENCOUNTER — Other Ambulatory Visit: Payer: Self-pay

## 2024-01-04 ENCOUNTER — Encounter (INDEPENDENT_AMBULATORY_CARE_PROVIDER_SITE_OTHER): Payer: Self-pay | Admitting: Family Medicine

## 2024-01-04 ENCOUNTER — Ambulatory Visit (INDEPENDENT_AMBULATORY_CARE_PROVIDER_SITE_OTHER): Admitting: Family Medicine

## 2024-01-04 VITALS — BP 126/67 | HR 65 | Temp 97.9°F | Ht 60.0 in | Wt 256.0 lb

## 2024-01-04 DIAGNOSIS — E669 Obesity, unspecified: Secondary | ICD-10-CM | POA: Diagnosis not present

## 2024-01-04 DIAGNOSIS — I10 Essential (primary) hypertension: Secondary | ICD-10-CM | POA: Diagnosis not present

## 2024-01-04 DIAGNOSIS — W19XXXS Unspecified fall, sequela: Secondary | ICD-10-CM | POA: Diagnosis not present

## 2024-01-04 DIAGNOSIS — Z6841 Body Mass Index (BMI) 40.0 and over, adult: Secondary | ICD-10-CM

## 2024-01-04 DIAGNOSIS — Z794 Long term (current) use of insulin: Secondary | ICD-10-CM

## 2024-01-04 DIAGNOSIS — E11319 Type 2 diabetes mellitus with unspecified diabetic retinopathy without macular edema: Secondary | ICD-10-CM

## 2024-01-04 DIAGNOSIS — S8012XD Contusion of left lower leg, subsequent encounter: Secondary | ICD-10-CM

## 2024-01-04 DIAGNOSIS — E11649 Type 2 diabetes mellitus with hypoglycemia without coma: Secondary | ICD-10-CM

## 2024-01-04 NOTE — Progress Notes (Signed)
 Office: [phone]  /  Fax: [phone]  WEIGHT SUMMARY AND BIOMETRICS  Anthropometric Measurements Height: 5' (1.524 m) Weight: 256 lb (116.1 kg) BMI (Calculated): 50 Weight at Last Visit: 259 lb Weight Lost Since Last Visit: 3 lb Weight Gained Since Last Visit: 0 Starting Weight: 258 lb Total Weight Loss (lbs): 1 lb (0.454 kg) Peak Weight: 259 lb   Body Composition  Body Fat %: 60.5 % Fat Mass (lbs): 155 lbs Muscle Mass (lbs): 96 lbs Visceral Fat Rating : 25   Other Clinical Data Fasting: no Labs: no Today's Visit #: 17 Starting Date: 05/30/22    Chief Complaint: OBESITY   History of Present Illness VENESHA PETRAITIS is a 73 year old female who presents for obesity treatment and progress assessment.  She has been following the category two eating plan about forty percent of the time. Despite a recent fall that has limited her ability to exercise, she has lost three pounds over the past two months.  The fall occurred while moving a box of cat supplies, resulting in a discolored and painful leg, particularly around the knee, and a sore wrist. X-rays showed no fractures. She reports that her knee is still swollen. The leg is about seventy-five percent improved, though some swelling and discomfort persist.  Her hypertension is managed with losartan , hydralazine , metoprolol , spironolactone , and amlodipine . She is working on diet and exercise modifications to further improve her blood pressure.  She has diabetes and has been experiencing low blood sugar episodes, leading to adjustments in her Basaglar  insulin  dosage. She struggles with maintaining a regular eating schedule due to changes in her daily routine after retirement, often eating her main meal late in the evening.  She is retired and has adjusted her daily routine, often staying up late and waking up late. She uses grocery delivery services to avoid shopping in person.      PHYSICAL EXAM:  Blood pressure  126/67, pulse 65, temperature 97.9 F (36.6 C), height 5' (1.524 m), weight 256 lb (116.1 kg), SpO2 95%. Body mass index is 50 kg/m.  DIAGNOSTIC DATA REVIEWED:  BMET    Component Value Date/Time   NA 142 11/27/2023 1100   NA 139 05/30/2022 1048   K 4.2 11/27/2023 1100   CL 106 11/27/2023 1100   CO2 27 11/27/2023 1100   GLUCOSE 83 11/27/2023 1100   BUN 30 (H) 11/27/2023 1100   BUN 34 (H) 05/30/2022 1048   CREATININE 1.24 (H) 11/27/2023 1100   CALCIUM  9.3 11/27/2023 1100   GFRNONAA 46 (L) 11/27/2023 1100   GFRNONAA 40 06/07/2023 1456   GFRAA 43 (L) 08/06/2019 0111   Lab Results  Component Value Date   HGBA1C 6.7 (H) 11/27/2023   HGBA1C 7.9 09/18/2014   Lab Results  Component Value Date   INSULIN  24.3 05/30/2022   Lab Results  Component Value Date   TSH 0.47 06/07/2023   CBC    Component Value Date/Time   WBC 14.3 (H) 11/27/2023 1100   RBC 4.79 11/27/2023 1100   HGB 12.7 11/27/2023 1100   HGB 12.4 05/30/2022 1048   HCT 41.7 11/27/2023 1100   HCT 40.0 05/30/2022 1048   PLT 348 11/27/2023 1100   PLT 378 05/30/2022 1048   MCV 87.1 11/27/2023 1100   MCV 88 05/30/2022 1048   MCH 26.5 11/27/2023 1100   MCHC 30.5 11/27/2023 1100   RDW 16.6 (H) 11/27/2023 1100   RDW 15.4 05/30/2022 1048   Iron Studies No results found for:  IRON, TIBC, FERRITIN, IRONPCTSAT Lipid Panel     Component Value Date/Time   CHOL 111 05/30/2022 1048   TRIG 114 05/30/2022 1048   HDL 35 (L) 05/30/2022 1048   LDLCALC 55 05/30/2022 1048   Hepatic Function Panel     Component Value Date/Time   PROT 6.6 05/30/2022 1048   ALBUMIN 3.9 05/30/2022 1048   AST 19 05/30/2022 1048   ALT 13 05/30/2022 1048   ALKPHOS 87 05/30/2022 1048   BILITOT <0.2 05/30/2022 1048      Component Value Date/Time   TSH 0.47 06/07/2023 1456   TSH 0.790 05/30/2022 1048   Nutritional Lab Results  Component Value Date   VD25OH 34.8 05/30/2022   VD25OH 31.4 11/28/2017   VD25OH 30.5 07/18/2017      Assessment and Plan Assessment & Plan Obesity Following the category two eating plan about 40% of the time. Lost three pounds in the last two months despite a recent fall and reduced physical activity. No significant challenges with hunger or cravings reported. Discussion about adjusting eating schedule due to changes in daily routine. - Continue category two eating plan - Discuss strategies to adjust eating schedule to align with new daily routine  Hypertension Hypertension is well-controlled with losartan , hydralazine , metoprolol , spironolactone , and amlodipine . Blood pressure today is 126/67 mmHg. Efforts to modify diet and exercise are ongoing to support blood pressure control. - Continue current antihypertensive regimen - Encourage dietary modifications and regular exercise  Type 2 diabetes mellitus with hypoglycemia Experiencing episodes of hypoglycemia, particularly in the mornings due to irregular eating schedule. Basaglar  dosage has been adjusted by another provider to address frequent lows. Discussion about incorporating a bedtime snack to stabilize overnight blood glucose levels. - Continue adjusted Basaglar  dosage with her endocrinologist as advised - Incorporate a bedtime snack, such as string cheese, to prevent overnight hypoglycemia  Left lower extremity contusion and swelling after fall Sustained a contusion and swelling in the left lower extremity following a fall. The condition has improved by approximately 75% but still presents with swelling and discomfort. X-rays showed no fracture or damage to the prosthetic knee. - Avoid activities that may exacerbate swelling or discomfort  Goals of Care Discussion about the importance of preventing falls and maintaining safety at home to avoid further injuries. - Implement fall prevention strategies at home  Follow-Up Scheduled follow-up appointments to monitor progress and adjust treatment plans as necessary. - Schedule  follow-up appointment on November 19th at 12:40 PM - Schedule follow-up appointment on December 31st at 11:20 AM     I personally spent a total of 44 minutes in the care of the patient today including preparing to see the patient, performing a medically appropriate evaluation of current problems, placing orders in the EMR, documenting clinical information in the EMR, customized nutritional counseling for their specific health and social needs, explaining the pathophysiology of obesity and how it is significantly more complex than eat less and exercise more, and discussing strategies to help avoid social eating challenges.    Medha was informed of the importance of frequent follow up visits to maximize her success with intensive lifestyle modifications for her obesity and obesity related health conditions as recommended by USPSTF and CMS guidelines   Louann Penton, MD

## 2024-01-11 ENCOUNTER — Other Ambulatory Visit (HOSPITAL_COMMUNITY): Payer: Self-pay

## 2024-01-11 MED ORDER — PREGABALIN 75 MG PO CAPS
75.0000 mg | ORAL_CAPSULE | Freq: Two times a day (BID) | ORAL | 0 refills | Status: DC
  Filled 2024-01-11: qty 60, 30d supply, fill #0

## 2024-01-15 ENCOUNTER — Other Ambulatory Visit (HOSPITAL_COMMUNITY): Payer: Self-pay

## 2024-01-15 DIAGNOSIS — M4807 Spinal stenosis, lumbosacral region: Secondary | ICD-10-CM | POA: Diagnosis not present

## 2024-01-15 DIAGNOSIS — Z9889 Other specified postprocedural states: Secondary | ICD-10-CM | POA: Diagnosis not present

## 2024-01-15 DIAGNOSIS — M5459 Other low back pain: Secondary | ICD-10-CM | POA: Diagnosis not present

## 2024-01-15 MED ORDER — FLUZONE HIGH-DOSE 0.5 ML IM SUSY
0.5000 mL | PREFILLED_SYRINGE | Freq: Once | INTRAMUSCULAR | 0 refills | Status: AC
  Filled 2024-01-15: qty 0.5, 1d supply, fill #0

## 2024-01-22 ENCOUNTER — Other Ambulatory Visit (HOSPITAL_COMMUNITY): Payer: Self-pay

## 2024-01-22 DIAGNOSIS — M545 Low back pain, unspecified: Secondary | ICD-10-CM | POA: Diagnosis not present

## 2024-01-29 ENCOUNTER — Other Ambulatory Visit (HOSPITAL_COMMUNITY): Payer: Self-pay

## 2024-01-29 ENCOUNTER — Other Ambulatory Visit: Payer: Self-pay

## 2024-01-29 DIAGNOSIS — M5416 Radiculopathy, lumbar region: Secondary | ICD-10-CM | POA: Diagnosis not present

## 2024-01-30 DIAGNOSIS — E113512 Type 2 diabetes mellitus with proliferative diabetic retinopathy with macular edema, left eye: Secondary | ICD-10-CM | POA: Diagnosis not present

## 2024-01-31 ENCOUNTER — Ambulatory Visit (INDEPENDENT_AMBULATORY_CARE_PROVIDER_SITE_OTHER): Payer: Self-pay | Admitting: Family Medicine

## 2024-01-31 ENCOUNTER — Encounter (INDEPENDENT_AMBULATORY_CARE_PROVIDER_SITE_OTHER): Payer: Self-pay | Admitting: Family Medicine

## 2024-01-31 ENCOUNTER — Other Ambulatory Visit (HOSPITAL_COMMUNITY): Payer: Self-pay

## 2024-01-31 VITALS — BP 123/73 | HR 64 | Temp 98.1°F | Ht 60.0 in | Wt 255.0 lb

## 2024-01-31 DIAGNOSIS — Z794 Long term (current) use of insulin: Secondary | ICD-10-CM

## 2024-01-31 DIAGNOSIS — E1122 Type 2 diabetes mellitus with diabetic chronic kidney disease: Secondary | ICD-10-CM

## 2024-01-31 DIAGNOSIS — Z7985 Long-term (current) use of injectable non-insulin antidiabetic drugs: Secondary | ICD-10-CM

## 2024-01-31 DIAGNOSIS — N183 Chronic kidney disease, stage 3 unspecified: Secondary | ICD-10-CM

## 2024-01-31 DIAGNOSIS — Z6841 Body Mass Index (BMI) 40.0 and over, adult: Secondary | ICD-10-CM

## 2024-01-31 NOTE — Progress Notes (Signed)
 Office: [phone]  /  Fax: [phone]  WEIGHT SUMMARY AND BIOMETRICS  Anthropometric Measurements Height: 5' (1.524 m) Weight: 255 lb (115.7 kg) BMI (Calculated): 49.8 Weight at Last Visit: 256 lb Weight Lost Since Last Visit: 1 lb Weight Gained Since Last Visit: 0 Starting Weight: 258 lb Total Weight Loss (lbs): 3 lb (1.361 kg) Peak Weight: 259 lb   Body Composition  Body Fat %: 59.6 % Fat Mass (lbs): 152.4 lbs Muscle Mass (lbs): 98.2 lbs Visceral Fat Rating : 24   Other Clinical Data Fasting: no Labs: no Today's Visit #: 18 Starting Date: 05/30/22    Chief Complaint: OBESITY    History of Present Illness [name] is a 73 year old female with obesity and type 2 diabetes who presents for obesity treatment and progress assessment.  She is following a category two eating plan but adheres only 50% of the time. She is not currently engaging in any exercise and has lost one pound over the past month. Her sleep schedule is irregular, with a delayed circadian rhythm leading to later meal times. She avoids eating late at night to prevent sleep disturbances.  Her type 2 diabetes is managed with Basaglar , recently adjusted to 50 units twice a day, and Trulicity  4.5 mg. Her hemoglobin A1c improved to 6.7% in September from 7.7% in March of the previous year. She experiences low blood sugar levels in the morning, sometimes waking her up, but does not feel hungry at that time.  She has stage three chronic kidney disease, but there is no specific discussion about symptoms or treatment changes related to this condition during the visit.  She has a history of spinal stenosis and lumbar radiculopathy, with recent MRI results showing spondylolisthesis. She experiences neck, shoulder, and lower back pain, which worsened yesterday.  She mentions having an eye injection recently, which she has become accustomed to, despite initial discomfort with the procedure.  She has a  bruise from a previous fall, which has left a persistent discoloration, described as 'strange' and 'like dirt'.      PHYSICAL EXAM:  Blood pressure 123/73, pulse 64, temperature 98.1 F (36.7 C), height 5' (1.524 m), weight 255 lb (115.7 kg), SpO2 94%. Body mass index is 49.8 kg/m.  DIAGNOSTIC DATA REVIEWED:  BMET    Component Value Date/Time   NA 142 11/27/2023 1100   NA 139 05/30/2022 1048   K 4.2 11/27/2023 1100   CL 106 11/27/2023 1100   CO2 27 11/27/2023 1100   GLUCOSE 83 11/27/2023 1100   BUN 30 (H) 11/27/2023 1100   BUN 34 (H) 05/30/2022 1048   CREATININE 1.24 (H) 11/27/2023 1100   CALCIUM  9.3 11/27/2023 1100   GFRNONAA 46 (L) 11/27/2023 1100   GFRNONAA 40 06/07/2023 1456   GFRAA 43 (L) 08/06/2019 0111   Lab Results  Component Value Date   HGBA1C 6.7 (H) 11/27/2023   HGBA1C 7.9 09/18/2014   Lab Results  Component Value Date   INSULIN  24.3 05/30/2022   Lab Results  Component Value Date   TSH 0.47 06/07/2023   CBC    Component Value Date/Time   WBC 14.3 (H) 11/27/2023 1100   RBC 4.79 11/27/2023 1100   HGB 12.7 11/27/2023 1100   HGB 12.4 05/30/2022 1048   HCT 41.7 11/27/2023 1100   HCT 40.0 05/30/2022 1048   PLT 348 11/27/2023 1100   PLT 378 05/30/2022 1048   MCV 87.1 11/27/2023 1100   MCV 88 05/30/2022 1048   MCH  26.5 11/27/2023 1100   MCHC 30.5 11/27/2023 1100   RDW 16.6 (H) 11/27/2023 1100   RDW 15.4 05/30/2022 1048   Iron Studies No results found for: IRON, TIBC, FERRITIN, IRONPCTSAT Lipid Panel     Component Value Date/Time   CHOL 111 05/30/2022 1048   TRIG 114 05/30/2022 1048   HDL 35 (L) 05/30/2022 1048   LDLCALC 55 05/30/2022 1048   Hepatic Function Panel     Component Value Date/Time   PROT 6.6 05/30/2022 1048   ALBUMIN 3.9 05/30/2022 1048   AST 19 05/30/2022 1048   ALT 13 05/30/2022 1048   ALKPHOS 87 05/30/2022 1048   BILITOT <0.2 05/30/2022 1048      Component Value Date/Time   TSH 0.47 06/07/2023 1456   TSH  0.790 05/30/2022 1048   Nutritional Lab Results  Component Value Date   VD25OH 34.8 05/30/2022   VD25OH 31.4 11/28/2017   VD25OH 30.5 07/18/2017     Assessment and Plan Assessment & Plan Morbid obesity She has been prescribed the category two eating plan but reports adherence only 50% of the time. She is not currently exercising and has lost one pound in the last month. She reports not feeling hungry and is managing her eating schedule, although her circadian rhythm is shifting. She is not experiencing significant challenges with nighttime snacking and is managing her diet well during the upcoming holidays. - Continue category two eating plan  Type 2 diabetes mellitus with stage 3 chronic kidney disease She is on Basaglar  and Trulicity  for diabetes management. Her most recent hemoglobin A1c was 6.7 in September, improved from 7.7 in March of last year. Her Basaglar  dose was recently reduced to 50 units twice a day from 60 units. She is advised that low blood sugar is more dangerous than high in the short term. She is managing her blood sugar levels well and is not experiencing significant issues with hypoglycemia. - Continue Basaglar  at 50 units twice a day - Continue Trulicity  at 4.5 mg - Monitor blood sugar levels regularly    [name] was counseled on the importance of maintaining healthy lifestyle habits, including balanced nutrition, regular physical activity, and behavioral modifications, while taking antiobesity medication.  Patient verbalized understanding that medication is an adjunct to, not a replacement for, lifestyle changes and that the long-term success and weight maintenance depend on continued adherence to these strategies.   [name] was informed of the importance of frequent follow up visits to maximize her success with intensive lifestyle modifications for her obesity and obesity related health conditions as recommended by USPSTF and CMS guidelines   Louann Penton,  MD

## 2024-02-06 DIAGNOSIS — M4807 Spinal stenosis, lumbosacral region: Secondary | ICD-10-CM | POA: Diagnosis not present

## 2024-02-09 ENCOUNTER — Other Ambulatory Visit (HOSPITAL_COMMUNITY): Payer: Self-pay

## 2024-02-12 DIAGNOSIS — E113511 Type 2 diabetes mellitus with proliferative diabetic retinopathy with macular edema, right eye: Secondary | ICD-10-CM | POA: Diagnosis not present

## 2024-02-14 ENCOUNTER — Other Ambulatory Visit (HOSPITAL_COMMUNITY): Payer: Self-pay

## 2024-02-15 ENCOUNTER — Other Ambulatory Visit: Payer: Self-pay

## 2024-02-15 ENCOUNTER — Telehealth (HOSPITAL_BASED_OUTPATIENT_CLINIC_OR_DEPARTMENT_OTHER): Payer: Self-pay

## 2024-02-15 ENCOUNTER — Encounter: Payer: Self-pay | Admitting: Cardiovascular Disease

## 2024-02-15 ENCOUNTER — Telehealth (HOSPITAL_BASED_OUTPATIENT_CLINIC_OR_DEPARTMENT_OTHER): Payer: Self-pay | Admitting: *Deleted

## 2024-02-15 ENCOUNTER — Other Ambulatory Visit (HOSPITAL_COMMUNITY): Payer: Self-pay

## 2024-02-15 NOTE — Telephone Encounter (Signed)
Left message to call back to set up tele pre op appt.  

## 2024-02-15 NOTE — Telephone Encounter (Signed)
 Pt has been scheduled tele preop appt 02/26/24. Med rec and consent are done. Per pt procedure not scheduled until clearance has been given.      Patient Consent for Virtual Visit        [name] has provided verbal consent on 02/15/2024 for a virtual visit (video or telephone).   CONSENT FOR VIRTUAL VISIT FOR:  [name]  By participating in this virtual visit I agree to the following:  I hereby voluntarily request, consent and authorize [facility] and its employed or contracted physicians, physician assistants, nurse practitioners or other licensed health care professionals (the Practitioner), to provide me with telemedicine health care services (the "Services) as deemed necessary by the treating Practitioner. I acknowledge and consent to receive the Services by the Practitioner via telemedicine. I understand that the telemedicine visit will involve communicating with the Practitioner through live audiovisual communication technology and the disclosure of certain medical information by electronic transmission. I acknowledge that I have been given the opportunity to request an in-person assessment or other available alternative prior to the telemedicine visit and am voluntarily participating in the telemedicine visit.  I understand that I have the right to withhold or withdraw my consent to the use of telemedicine in the course of my care at any time, without affecting my right to future care or treatment, and that the Practitioner or I may terminate the telemedicine visit at any time. I understand that I have the right to inspect all information obtained and/or recorded in the course of the telemedicine visit and may receive copies of available information for a reasonable fee.  I understand that some of the potential risks of receiving the Services via telemedicine include:  Delay or interruption in medical evaluation due to technological equipment failure or  disruption; Information transmitted may not be sufficient (e.g. poor resolution of images) to allow for appropriate medical decision making by the Practitioner; and/or  In rare instances, security protocols could fail, causing a breach of personal health information.  Furthermore, I acknowledge that it is my responsibility to provide information about my medical history, conditions and care that is complete and accurate to the best of my ability. I acknowledge that Practitioner's advice, recommendations, and/or decision may be based on factors not within their control, such as incomplete or inaccurate data provided by me or distortions of diagnostic images or specimens that may result from electronic transmissions. I understand that the practice of medicine is not an exact science and that Practitioner makes no warranties or guarantees regarding treatment outcomes. I acknowledge that a copy of this consent can be made available to me via my patient portal [facility] MyChart), or I can request a printed copy by calling the office of  [facility].    I understand that my insurance will be billed for this visit.   I have read or had this consent read to me. I understand the contents of this consent, which adequately explains the benefits and risks of the Services being provided via telemedicine.  I have been provided ample opportunity to ask questions regarding this consent and the Services and have had my questions answered to my satisfaction. I give my informed consent for the services to be provided through the use of telemedicine in my medical care

## 2024-02-15 NOTE — Telephone Encounter (Signed)
 Pharmacy please advise on holding Xarelto  prior to RT L5-S1 IL ESI scheduled for TBD. Last labs 11/27/2023.Thank you.

## 2024-02-15 NOTE — Telephone Encounter (Signed)
 Pt has been scheduled tele preop appt 02/26/24. Med rec and consent are done. Per pt procedure not scheduled until clearance has been given.

## 2024-02-15 NOTE — Telephone Encounter (Signed)
   Name: [name]  DOB: [birth year]  MRN: [id number]  Primary Cardiologist: Maude Emmer, MD   Preoperative team, please contact this patient and set up a phone call appointment for further preoperative risk assessment. Please obtain consent and complete medication review. Thank you for your help.  I confirm that guidance regarding antiplatelet and oral anticoagulation therapy has been completed and, if necessary, noted below.  Recommendations for holding Xarelto  is requested from pharmacy.   I also confirmed the patient resides in the state of [place] . As per [facility] [employer] telemedicine laws, the patient must reside in the state in which the provider is licensed.   Lamarr Satterfield, NP 02/15/2024, 2:38 PM [facility]

## 2024-02-15 NOTE — Telephone Encounter (Signed)
 Pt returning call

## 2024-02-15 NOTE — Telephone Encounter (Signed)
   Pre-operative Risk Assessment    Patient Name: [name]  DOB: [birth year] MRN: [id number]   Date of last office visit: 06/14/23 with Lelon  Date of next office visit: NA  Request for Surgical Clearance    Procedure:  RT L5-S1 IL ESI  Date of Surgery:  Clearance TBD                                 Surgeon:  Dr. Margeret Surgeon's Group or Practice Name:  [facility] Phone number:  [phone] Fax number:  [phone]   Type of Clearance Requested:   - Medical  - Pharmacy:  Hold Rivaroxaban  (Xarelto ) not indicated   Type of Anesthesia:  Not Indicated   Additional requests/questions:    [name]   02/15/2024, 2:18 PM

## 2024-02-16 ENCOUNTER — Other Ambulatory Visit (HOSPITAL_COMMUNITY): Payer: Self-pay

## 2024-02-16 MED ORDER — BASAGLAR KWIKPEN 100 UNIT/ML ~~LOC~~ SOPN
50.0000 [IU] | PEN_INJECTOR | Freq: Two times a day (BID) | SUBCUTANEOUS | 11 refills | Status: AC
  Filled 2024-02-16: qty 45, 45d supply, fill #0
  Filled 2024-04-02: qty 45, 45d supply, fill #1

## 2024-02-16 MED ORDER — METHOCARBAMOL 500 MG PO TABS
500.0000 mg | ORAL_TABLET | Freq: Four times a day (QID) | ORAL | 2 refills | Status: AC | PRN
  Filled 2024-02-16: qty 60, 8d supply, fill #0
  Filled 2024-04-02: qty 60, 8d supply, fill #1

## 2024-02-18 NOTE — Telephone Encounter (Signed)
 Patient with diagnosis of afib on Xarelto  for anticoagulation.    Procedure: RT L5-S1 IL ESI  Date of procedure: TBD   CHA2DS2-VASc Score = 5   This indicates a 7.2% annual risk of stroke. The patient's score is based upon: CHF History: 1 HTN History: 1 Diabetes History: 1 Stroke History: 0 Vascular Disease History: 0 Age Score: 1 Gender Score: 1      CrCl 46 ml/min Platelet count 348  Patient has not had an Afib/aflutter ablation in the last 3 months, DCCV within the last 4 weeks or a watchman implanted in the last 45 days   Per office protocol, patient can hold Xarelto  for 3 days prior to procedure.    **This guidance is not considered finalized until pre-operative APP has relayed final recommendations.**

## 2024-02-26 ENCOUNTER — Ambulatory Visit: Attending: Cardiology

## 2024-02-26 DIAGNOSIS — Z01810 Encounter for preprocedural cardiovascular examination: Secondary | ICD-10-CM

## 2024-02-26 NOTE — Progress Notes (Signed)
 Virtual Visit via Telephone Note   Because of [name] co-morbid illnesses, she is at least at moderate risk for complications without adequate follow up.  This format is felt to be most appropriate for this patient at this time.  Due to technical limitations with video connection (technology), today's appointment will be conducted as an audio only telehealth visit, and [name] verbally agreed to proceed in this manner.   All issues noted in this document were discussed and addressed.  No physical exam could be performed with this format.  Evaluation Performed:  Preoperative cardiovascular risk assessment _____________   Date:  02/26/2024   Patient ID:  [name], DOB 1951/02/03, MRN [id number] Patient Location:  Home Provider location:   Office  Primary Care Provider:  Dwight Trula SQUIBB, MD Primary Cardiologist:  Maude Emmer, MD  Chief Complaint / Patient Profile   73 y.o. y/o female with a h/o Afib, HLD, HTN, DM with diabetic retinopathy, who is pending RT L5-S1 IL ESI and presents today for telephonic preoperative cardiovascular risk assessment.  History of Present Illness    [name] is a 73 y.o. female who presents via audio/video conferencing for a telehealth visit today.  Pt was last seen in cardiology clinic on 06/14/23 by Glendia Ferrier, PA-C.  At that time [name] was doing well.  The patient is now pending procedure as outlined above. Since her last visit, she has been doing well. She does have some SOB with activity but this has been stable over the past 6 months. Back pain with household tasks. She is able to do 4 mets of activity.   Per office protocol, patient can hold Xarelto  for 3 days prior to procedure. You can resume when medically safe to do so.   Past Medical History    Past Medical History:  Diagnosis Date   A-fib (HCC)    Anemia    hx of   Arthritis    Chest pain    Constipation    Depression    Depression    Diabetes mellitus  ORAL MEDS   Diabetic retinopathy (HCC)    Diabetic retinopathy (HCC)    Dyspnea    with minimal exertion-deconditioned   Dyspnea    Dysrhythmia    a-fib   Food allergy    GERD (gastroesophageal reflux disease)    occasional   Gout    Heartburn    History of kidney stones    Hypercholesteremia    Hyperlipidemia    Hypertension    Hypertensive kidney disease    Hypothyroidism    Joint pain    Kidney problem    Knee pain, right    Left arm numbness DUE TO CERVICAL PINCHED NERVE   Obesity    OSA on CPAP    uses CPAP nightly   Osteoarthritis    Osteoarthritis    Palpitations    Pinched nerve in neck    Pleurisy    Pneumonia    hx of   PONV (postoperative nausea and vomiting)    for 3-4 days after general anesthesia approx 10-12 years ago   Sciatica    Shortness of breath    Sleep apnea    Spinal stenosis    Swelling of both lower extremities    Swelling of knee joint, right    Synovitis of knee RIGHT   Trigger finger, left    left index   Vitamin D  deficiency    Past  Surgical History:  Procedure Laterality Date   ANTERIOR CERVICAL DECOMP/DISCECTOMY FUSION N/A 11/29/2023   Procedure: ANTERIOR CERVICAL DECOMPRESSION/DISCECTOMY FUSION CERVICAL FIVE-CERVICAL SIX, CERVICAL SIX- CERVICAL SEVEN,CERVICAL SEVEN- THORACIC ONE WITH INSTRUMENTATION AND ALLOGRAFT;  Surgeon: Beuford Anes, MD;  Location: [facility] OR;  Service: Orthopedics;  Laterality: N/A;   CESAREAN SECTION  X3   KNEE ARTHROSCOPY  04/20/2011   Procedure: ARTHROSCOPY KNEE;  Surgeon: Dempsey LULLA Moan, MD;  Location: [facility];  Service: Orthopedics;  Laterality: Right;  WITH SYNOVECTOMY   LEFT CARPAL TUNNEL / LEFT MIDDLE & RING FINGER TRIGGER RELEASE  08-26-2008   LEFT SHOULDER ARTHROSCOPY W/ DEBRIDEMENT  09-09-2003   LEFT SHOULDER ARTHROSCOPY/ LEFT THUMB TRIGGER RELEASE  02-22-2005   PHOTOCOAGULATION WITH LASER Right 03/20/2018   Procedure: PHOTOCOAGULATION WITH LASER;  Surgeon: Tobie Baptist, MD;   Location: [facility];  Service: Ophthalmology;  Laterality: Right;   PULLEY RELEASE LEFT LONG FINGER  07-14-2009   RADIAL HEAD ARTHROPLASTY Right 06/17/2018   Procedure: RADIAL HEAD ARTHROPLASTY;  Surgeon: Cristy Bonner DASEN, MD;  Location: [facility] OR;  Service: Orthopedics;  Laterality: Right;   RIGHT CARPAL TUNNEL/ RIGHT THUMB TRIGGER RELEASE'S  11-28-2006   RIGHT SHOULDER ARTHROSCOPY W/ ROTATOR CUFF REPAIR  01-13-2004   SHOULDER ARTHROSCOPY DISTAL CLAVICLE EXCISION AND OPEN ROTATOR CUFF REPAIR  09-07-2004   LEFT   SHOULDER ARTHROSCOPY W/ ACROMIAL REPAIR  11-29-2005   LEFT   SYNOVECTOMY Left 06/20/2022   Procedure: left index finger and left long finger metacarpal phalangel joint syonvectomy;  Surgeon: Sissy Cough, MD;  Location: [facility] OR;  Service: Orthopedics;  Laterality: Left;  needs 1 hour of time..   TONSILLECTOMY AND ADENOIDECTOMY  child   TOTAL KNEE ARTHROPLASTY  12-14-2009   RIGHT   TOTAL KNEE ARTHROPLASTY Left 11/05/2012   Procedure: LEFT TOTAL KNEE ARTHROPLASTY;  Surgeon: Dempsey LULLA Moan, MD;  Location: [facility];  Service: Orthopedics;  Laterality: Left;   TOTAL KNEE REVISION Right 07/29/2016   Procedure: RIGHT KNEE POLY-LINER EXCHANGE;  Surgeon: Vernetta Lonni GRADE, MD;  Location: [facility];  Service: Orthopedics;  Laterality: Right;   TRIGGER FINGER RELEASE Right 02/21/2013   Procedure: RIGHT RING A-1 PULLEY RELEASE    (MINOR PROCEDURE) ;  Surgeon: Lamar LULLA Leonor Mickey., MD;  Location: [facility];  Service: Orthopedics;  Laterality: Right;   TRIGGER FINGER RELEASE Left 09/22/2016   Procedure: RELEASE TRIGGER FINGER LEFT INDEX FINGER;  Surgeon: Vernetta Lonni GRADE, MD;  Location: [facility] OR;  Service: Orthopedics;  Laterality: Left;   TRIGGER FINGER RELEASE Right 01/20/2017   Procedure: RELEASE TRIGGER FINGER/A-1 PULLEY RIGHT INDEX FINGER;  Surgeon: Sheril Coy, MD;  Location: [facility];  Service: Orthopedics;  Laterality: Right;   VITRECTOMY 25 GAUGE WITH SCLERAL  BUCKLE Right 03/20/2018   Procedure: RIGHT EYE VITRECTOMY WITH  ENDOLASER PARENTAL PHOTOCOAGULATION 25 GAUGE;  Surgeon: Tobie Baptist, MD;  Location: [facility] OR;  Service: Ophthalmology;  Laterality: Right;   WRIST ARTHROSCOPY WITH DEBRIDEMENT Right 05/01/2019   Procedure: WRIST ARTHROSCOPY WITH DEBRIDEMENT;  Surgeon: Sissy Cough, MD;  Location: [facility];  Service: Orthopedics;  Laterality: Right;    Allergies  Allergies[1]  Home Medications    Prior to Admission medications  Medication Sig Start Date End Date Taking? Authorizing Provider  Aflibercept  (EYLEA ) 2 MG/0.05ML SOLN Place 2 mg into both eyes See admin instructions. Every six weeks 09/03/20   [provider]  amLODipine  (NORVASC ) 5 MG tablet Take 1 tablet (5 mg total) by mouth daily.  12/06/23   Delford Maude BROCKS, MD  atorvastatin  (LIPITOR) 40 MG tablet Take 1 tablet (40 mg total) by mouth daily. 12/06/23     Cholecalciferol  (VITAMIN D ) 50 MCG (2000 UT) tablet Take 2,000 Units by mouth daily.    [provider]  Continuous Blood Gluc Receiver (FREESTYLE LIBRE 3 READER) DEVI Use to check blood sugar 06/03/22   Faythe Purchase, MD  Continuous Glucose Sensor (FREESTYLE LIBRE 3 SENSOR) MISC Use as directed to check blood sugar. Change every 14 days 06/03/22   Faythe Purchase, MD  Continuous Glucose Sensor (FREESTYLE LIBRE 3 SENSOR) MISC Use as directed. Change every 14 days. 07/31/23     Continuous Glucose Sensor (FREESTYLE LIBRE 3 SENSOR) MISC change sensor every 14 days 12/06/23   Faythe Purchase, MD  cyclobenzaprine  (FLEXERIL ) 5 MG tablet Take 1-2 tablets (5-10 mg total) by mouth at bedtime as needed. 12/06/23     Dapagliflozin  Pro-metFORMIN  ER (XIGDUO  XR) 12-998 MG TB24 Take 1 tablet by mouth daily. 04/11/23     Dulaglutide  (TRULICITY ) 4.5 MG/0.5ML SOAJ Inject 4.5 mg into the skin once a week. 01/09/23     Dulaglutide  (TRULICITY ) 4.5 MG/0.5ML SOAJ Inject 4.5 mg into the skin once a week. 10/16/23     DULoxetine   (CYMBALTA ) 60 MG capsule Take 1 capsule (60 mg total) by mouth daily. 08/13/23     flecainide  (TAMBOCOR ) 100 MG tablet Take 1 tablet (100 mg total) by mouth 2 (two) times daily. 07/06/23   Delford Maude BROCKS, MD  glucose blood test strip Use to test blood sugar 3 times a day as directed 05/18/22     hydrALAZINE  (APRESOLINE ) 25 MG tablet Take 1 tablet (25 mg total) by mouth 2 (two) times daily with a meal. 11/07/23     HYDROcodone -acetaminophen  (NORCO/VICODIN) 5-325 MG tablet Take 1-2 tablets by mouth every 6 (six) hours as needed for moderate pain (pain score 4-6) or severe pain (pain score 7-10). 11/29/23   McKenzie, Kayla J, PA-C  HYDROcodone -acetaminophen  (NORCO/VICODIN) 5-325 MG tablet Take 1 tablet by mouth every six to eight hours as needed for pain 12/13/23     indomethacin  (INDOCIN ) 50 MG capsule Take 1 capsule (50 mg total) by mouth 2-3 (three) times daily as needed for gout pain. 12/06/23     Insulin  Glargine (BASAGLAR  KWIKPEN) 100 UNIT/ML Inject 50 Units into the skin 2 (two) times daily. 02/16/24     Insulin  Pen Needle 32G X 4 MM MISC Use to administer insulin  twice daily. 05/15/23   Faythe Purchase, MD  levothyroxine  (SYNTHROID ) 137 MCG tablet Take 1 tablet by mouth once daily in the morning on an empty stomach 04/07/23     losartan  (COZAAR ) 50 MG tablet Take 1 tablet (50 mg total) by mouth daily. 12/12/22     Melatonin 10 MG CAPS Take 10 mg by mouth at bedtime as needed (sleep).    [provider]  methocarbamol  (ROBAXIN ) 500 MG tablet Take 1-2 tablets (500-1,000 mg total) by mouth 4 (four) times daily. Patient taking differently: Take 500-1,000 mg by mouth 4 (four) times daily. TAKES ONLY PRN PER PT 11/29/23   McKenzie, Kayla J, PA-C  methocarbamol  (ROBAXIN ) 500 MG tablet Take 1-2 tablets by mouth every six to eight hours as needed FOR SPASMS/MUSCLE TENSION Patient not taking: Reported on 02/15/2024 12/13/23     methocarbamol  (ROBAXIN ) 500 MG tablet Take 1-2 tablets (500-1,000 mg total) by mouth  every 6 (six) to 8 (eight) hours as needed. 02/16/24     metoprolol  tartrate (  LOPRESSOR ) 50 MG tablet Take 1 tablet (50 mg total) by mouth 2 (two) times daily. 07/06/23   Delford Maude BROCKS, MD  Multiple Vitamins-Minerals (EQ VISION FORMULA 50+) CAPS Take 1 capsule by mouth daily.    [provider]  omeprazole  (PRILOSEC) 20 MG capsule Take 1 capsule (20 mg total) by mouth daily. 08/13/23     rivaroxaban  (XARELTO ) 20 MG TABS tablet Take 1 tablet (20 mg total) by mouth daily with supper. 11/20/23   Nishan, Peter C, MD  spironolactone  (ALDACTONE ) 25 MG tablet Take 0.5 tablets (12.5 mg total) by mouth every other day. 07/06/23   Delford Maude BROCKS, MD    Physical Exam    Vital Signs:  [name] does not have vital signs available for review today.  127/69  Given telephonic nature of communication, physical exam is limited. AAOx3. NAD. Normal affect.  Speech and respirations are unlabored.  Accessory Clinical Findings    None  Assessment & Plan    1.  Preoperative Cardiovascular Risk Assessment:  [name] perioperative risk of a major cardiac event is 0.9% according to the Revised Cardiac Risk Index (RCRI).  Therefore, she is at low risk for perioperative complications.   Her functional capacity is fair at 4.4 METs according to the [place] Activity Status Index (DASI). Recommendations: According to ACC/AHA guidelines, no further cardiovascular testing needed.  The patient may proceed to surgery at acceptable risk.   Antiplatelet and/or Anticoagulation Recommendations:  Xarelto  (Rivaroxaban ) can be held for 3 days prior to surgery.  Please resume post op when felt to be safe.    The patient was advised that if she develops new symptoms prior to surgery to contact our office to arrange for a follow-up visit, and she verbalized understanding.  A copy of this note will be routed to requesting surgeon.  Time:   Today, I have spent 9 minutes with the patient with telehealth technology  discussing medical history, symptoms, and management plan.     [name], PA-C  02/26/2024, 2:24 PM     [1]  Allergies Allergen Reactions   Actos [Pioglitazone Hydrochloride] Swelling and Other (See Comments)    Numbness in lips, HEADACHES SWELLING REACTION UNSPECIFIED    Avandia [Rosiglitazone] Swelling    Numbness in lips, headaches  SWELLING REACTION UNSPECIFIED    Tetanus Toxoid-Containing Vaccines Other (See Comments)    Arm swelling, fainted, ?Respiratory distress  (horse serum)   Food Nausea Only    Eggplant

## 2024-03-08 ENCOUNTER — Other Ambulatory Visit (HOSPITAL_COMMUNITY): Payer: Self-pay

## 2024-03-08 ENCOUNTER — Other Ambulatory Visit: Payer: Self-pay

## 2024-03-08 MED ORDER — LOSARTAN POTASSIUM 50 MG PO TABS
50.0000 mg | ORAL_TABLET | Freq: Every day | ORAL | 4 refills | Status: AC
  Filled 2024-03-08: qty 90, 90d supply, fill #0

## 2024-03-08 MED ORDER — DAPAGLIFLOZIN PRO-METFORMIN ER 10-1000 MG PO TB24
1.0000 | ORAL_TABLET | Freq: Every day | ORAL | 3 refills | Status: AC
  Filled 2024-04-02 – 2024-04-05 (×3): qty 90, 90d supply, fill #0

## 2024-03-12 ENCOUNTER — Other Ambulatory Visit (HOSPITAL_COMMUNITY): Payer: Self-pay

## 2024-03-13 ENCOUNTER — Ambulatory Visit (INDEPENDENT_AMBULATORY_CARE_PROVIDER_SITE_OTHER): Payer: Self-pay | Admitting: Family Medicine

## 2024-03-13 ENCOUNTER — Encounter (INDEPENDENT_AMBULATORY_CARE_PROVIDER_SITE_OTHER): Payer: Self-pay | Admitting: Family Medicine

## 2024-03-13 VITALS — BP 123/52 | HR 64 | Temp 98.2°F | Ht 60.0 in | Wt 254.0 lb

## 2024-03-13 DIAGNOSIS — Z7985 Long-term (current) use of injectable non-insulin antidiabetic drugs: Secondary | ICD-10-CM

## 2024-03-13 DIAGNOSIS — E669 Obesity, unspecified: Secondary | ICD-10-CM | POA: Diagnosis not present

## 2024-03-13 DIAGNOSIS — Z6841 Body Mass Index (BMI) 40.0 and over, adult: Secondary | ICD-10-CM | POA: Diagnosis not present

## 2024-03-13 DIAGNOSIS — E119 Type 2 diabetes mellitus without complications: Secondary | ICD-10-CM

## 2024-03-13 DIAGNOSIS — Z794 Long term (current) use of insulin: Secondary | ICD-10-CM | POA: Diagnosis not present

## 2024-03-13 DIAGNOSIS — Z7984 Long term (current) use of oral hypoglycemic drugs: Secondary | ICD-10-CM

## 2024-03-13 NOTE — Progress Notes (Signed)
 "  Office: [phone]  /  Fax: [phone]  WEIGHT SUMMARY AND BIOMETRICS  Anthropometric Measurements Height: 5' (1.524 m) Weight: 254 lb (115.2 kg) BMI (Calculated): 49.61 Weight at Last Visit: 255lb Weight Lost Since Last Visit: 1lb Weight Gained Since Last Visit: 0lb Starting Weight: 258lb Total Weight Loss (lbs): 4 lb (1.814 kg) Peak Weight: 259lb   Body Composition  Body Fat %: 59 % Fat Mass (lbs): 150 lbs Muscle Mass (lbs): 99.2 lbs Visceral Fat Rating : 24   Other Clinical Data Fasting: No Labs: No Today's Visit #: 19 Starting Date: 05/30/22    Chief Complaint: OBESITY    History of Present Illness [name] is a 72 year old female with obesity and type 2 diabetes who presents for obesity treatment and progress assessment.  She is adhering to the category two eating plan approximately fifty percent of the time, focusing on increasing her intake of whole fruits and vegetables, though she finds it challenging to consume adequate protein. Over the past six weeks, she has lost one pound, despite the challenges posed by the Thanksgiving and Christmas holidays.  She engages in physical activity by walking for ten minutes four days a week. She has purchased a pedal exerciser for home use and is exploring the possibility of adding Tai Chi to her routine.  She manages her type 2 diabetes with Xigduo , Trulicity , and Basaglar . Her blood sugar levels were temporarily affected for two days following a recent epidural injection for low back pain, but she was able to manage them effectively.  She experiences low back pain and recently received an epidural injection. She is currently undergoing therapy for her back and neck and is considering additional exercise options if her balance remains stable.      PHYSICAL EXAM:  Blood pressure (!) 123/52, pulse 64, temperature 98.2 F (36.8 C), height 5' (1.524 m), weight 254 lb (115.2 kg), SpO2 93%. Body mass index is  49.61 kg/m.  DIAGNOSTIC DATA REVIEWED:  BMET    Component Value Date/Time   NA 142 11/27/2023 1100   NA 139 05/30/2022 1048   K 4.2 11/27/2023 1100   CL 106 11/27/2023 1100   CO2 27 11/27/2023 1100   GLUCOSE 83 11/27/2023 1100   BUN 30 (H) 11/27/2023 1100   BUN 34 (H) 05/30/2022 1048   CREATININE 1.24 (H) 11/27/2023 1100   CALCIUM  9.3 11/27/2023 1100   GFRNONAA 46 (L) 11/27/2023 1100   GFRNONAA 40 06/07/2023 1456   GFRAA 43 (L) 08/06/2019 0111   Lab Results  Component Value Date   HGBA1C 6.7 (H) 11/27/2023   HGBA1C 7.9 09/18/2014   Lab Results  Component Value Date   INSULIN  24.3 05/30/2022   Lab Results  Component Value Date   TSH 0.47 06/07/2023   CBC    Component Value Date/Time   WBC 14.3 (H) 11/27/2023 1100   RBC 4.79 11/27/2023 1100   HGB 12.7 11/27/2023 1100   HGB 12.4 05/30/2022 1048   HCT 41.7 11/27/2023 1100   HCT 40.0 05/30/2022 1048   PLT 348 11/27/2023 1100   PLT 378 05/30/2022 1048   MCV 87.1 11/27/2023 1100   MCV 88 05/30/2022 1048   MCH 26.5 11/27/2023 1100   MCHC 30.5 11/27/2023 1100   RDW 16.6 (H) 11/27/2023 1100   RDW 15.4 05/30/2022 1048   Iron Studies No results found for: IRON, TIBC, FERRITIN, IRONPCTSAT Lipid Panel     Component Value Date/Time   CHOL 111 05/30/2022 1048  TRIG 114 05/30/2022 1048   HDL 35 (L) 05/30/2022 1048   LDLCALC 55 05/30/2022 1048   Hepatic Function Panel     Component Value Date/Time   PROT 6.6 05/30/2022 1048   ALBUMIN 3.9 05/30/2022 1048   AST 19 05/30/2022 1048   ALT 13 05/30/2022 1048   ALKPHOS 87 05/30/2022 1048   BILITOT <0.2 05/30/2022 1048      Component Value Date/Time   TSH 0.47 06/07/2023 1456   TSH 0.790 05/30/2022 1048   Nutritional Lab Results  Component Value Date   VD25OH 34.8 05/30/2022   VD25OH 31.4 11/28/2017   VD25OH 30.5 07/18/2017     Assessment and Plan Assessment & Plan Obesity Management is ongoing with a focus on dietary changes and physical  activity. She has been following the category two eating plan about 50% of the time, with improvements in consuming more whole fruits and vegetables. However, she struggles with meeting her protein intake recommendations. She has lost one pound over the last six weeks, which is notable given the holiday period. She is engaging in physical activity by walking for ten minutes four days a week and is considering Tai Chi for balance and strengthening. She is also using a pedal exerciser for core strengthening. - Continue category two eating plan with emphasis on increasing protein intake. - Encouraged participation in [place] [employer] for balance and strengthening. - Continue walking for exercise. - Use pedal exerciser for core strengthening.  Type 2 diabetes mellitus Managed with Xigduo , Trulicity , and Basaglar . Blood sugars have been well-controlled except for a temporary increase following a recent epidural injection. Nutritional counseling and weight loss are part of the management plan. She is monitoring her blood sugars and has been advised to maintain her current regimen. - Continue current diabetes medications: Xigduo , Trulicity , and Basaglar . - Continue diet, exercise and weight loss as discussed today as an important part of the treatment plan       Patients who are on anti-obesity medications are counseled on the importance of maintaining healthy lifestyle habits, including balanced nutrition, regular physical activity, and behavioral modifications,  Medication is an adjunct to, not a replacement for, lifestyle changes and that the long-term success and weight maintenance depend on continued adherence to these strategies.   [name] was informed of the importance of frequent follow up visits to maximize her success with intensive lifestyle modifications for her obesity and obesity related health conditions as recommended by USPSTF and CMS guidelines  Louann Penton, MD   "

## 2024-03-26 ENCOUNTER — Other Ambulatory Visit (HOSPITAL_COMMUNITY): Payer: Self-pay

## 2024-03-27 ENCOUNTER — Other Ambulatory Visit (HOSPITAL_COMMUNITY): Payer: Self-pay

## 2024-03-27 ENCOUNTER — Other Ambulatory Visit: Payer: Self-pay

## 2024-03-27 MED ORDER — INDOMETHACIN 50 MG PO CAPS
ORAL_CAPSULE | ORAL | 0 refills | Status: AC
  Filled 2024-03-27: qty 20, 7d supply, fill #0

## 2024-03-27 MED ORDER — HYDRALAZINE HCL 25 MG PO TABS
25.0000 mg | ORAL_TABLET | Freq: Two times a day (BID) | ORAL | 0 refills | Status: AC
  Filled 2024-03-27: qty 180, 90d supply, fill #0

## 2024-03-27 MED ORDER — TRULICITY 4.5 MG/0.5ML ~~LOC~~ SOAJ
4.5000 mg | SUBCUTANEOUS | 3 refills | Status: AC
  Filled 2024-03-27: qty 2, 28d supply, fill #0

## 2024-04-02 ENCOUNTER — Other Ambulatory Visit: Payer: Self-pay

## 2024-04-02 ENCOUNTER — Other Ambulatory Visit (HOSPITAL_COMMUNITY): Payer: Self-pay

## 2024-04-03 ENCOUNTER — Other Ambulatory Visit (HOSPITAL_COMMUNITY): Payer: Self-pay

## 2024-04-05 ENCOUNTER — Other Ambulatory Visit: Payer: Self-pay | Admitting: Orthopedic Surgery

## 2024-04-05 ENCOUNTER — Other Ambulatory Visit (HOSPITAL_COMMUNITY): Payer: Self-pay

## 2024-04-05 ENCOUNTER — Other Ambulatory Visit: Payer: Self-pay

## 2024-04-11 ENCOUNTER — Ambulatory Visit (INDEPENDENT_AMBULATORY_CARE_PROVIDER_SITE_OTHER): Admitting: Family Medicine

## 2024-04-11 ENCOUNTER — Encounter (INDEPENDENT_AMBULATORY_CARE_PROVIDER_SITE_OTHER): Payer: Self-pay

## 2024-04-17 ENCOUNTER — Telehealth (HOSPITAL_BASED_OUTPATIENT_CLINIC_OR_DEPARTMENT_OTHER): Payer: Self-pay | Admitting: *Deleted

## 2024-04-17 NOTE — Telephone Encounter (Signed)
 Patient with diagnosis of afib on Xarelto   for anticoagulation.    Procedure: RT sided lumbar 4-5, Lumbar 5-sacrum, Transforaminal lumbar interbody fusion with instrumentation and graft  Date of procedure: 04/24/2024   CHA2DS2-VASc Score = 5   This indicates a 7.2% annual risk of stroke. The patient's score is based upon: CHF History: 1 HTN History: 1 Diabetes History: 1 Stroke History: 0 Vascular Disease History: 0 Age Score: 1 Gender Score: 1      CrCl 46 mL/min Platelet count 348   Patient has not had an Afib/aflutter ablation in the last 3 months, DCCV within the last 4 weeks or a watchman implanted in the last 45 days     Per office protocol, patient can hold Xarelto   for 3 days prior to procedure.   Patient will not need bridging with Lovenox (enoxaparin) around procedure.  **This guidance is not considered finalized until pre-operative APP has relayed final recommendations.**

## 2024-04-17 NOTE — Telephone Encounter (Signed)
 Left message to call back to schedule tele pre op appt.

## 2024-04-17 NOTE — Telephone Encounter (Signed)
" ° °  Name: [name]  DOB: 1950-05-19  MRN: [id number]  Primary Cardiologist: Maude Emmer, MD  Chart reviewed as part of pre-operative protocol coverage. Because of [name]'s past medical history and time since last visit, she will require a follow-up telephone visit in order to better assess preoperative cardiovascular risk.  Pre-op covering staff: - Please schedule appointment and call patient to inform them. If patient already had an upcoming appointment within acceptable timeframe, please add pre-op clearance to the appointment notes so provider is aware. - Please contact requesting surgeon's office via preferred method (i.e, phone, fax) to inform them of need for appointment prior to surgery.  Per office protocol, patient can hold Xarelto   for 3 days prior to procedure.  Please resume when medically safe to do so. Patient will not need bridging with Lovenox (enoxaparin) around procedure.   [name], PA-C  04/17/2024, 4:53 PM   "

## 2024-04-17 NOTE — Telephone Encounter (Signed)
"  ° °  Pre-operative Risk Assessment    Patient Name: [name]  DOB: 10/04/1950 MRN: [id number]   Date of last office visit: 02/26/2024 Date of next office visit: None  Request for Surgical Clearance    Procedure:  RT sided lumbar 4-5, Lumbar 5-sacrum, Transforaminal lumbar interbody fusion with instrumentation and graft  Date of Surgery:  Clearance 04/24/24                                 Surgeon:  Dr. Oneil Priestly Surgeon's Group or Practice Name:  [facility] Phone number:  [phone] Fax number:  [phone]   Type of Clearance Requested:   - Medical  - Pharmacy:  Hold Rivaroxaban  (Xarelto ) Not indicated.   Type of Anesthesia:  Not Indicated   Additional requests/questions:    Signed, Edsel Grayce Sanders   04/17/2024, 11:14 AM   "

## 2024-04-18 ENCOUNTER — Telehealth: Payer: Self-pay | Admitting: *Deleted

## 2024-04-18 NOTE — Telephone Encounter (Signed)
"  °  Patient Consent for Virtual Visit        [name] has provided verbal consent on 04/18/2024 for a virtual visit (video or telephone).   CONSENT FOR VIRTUAL VISIT FOR:  [name]  By participating in this virtual visit I agree to the following:  I hereby voluntarily request, consent and authorize [facility] and its employed or contracted physicians, physician assistants, nurse practitioners or other licensed health care professionals (the Practitioner), to provide me with telemedicine health care services (the Services) as deemed necessary by the treating Practitioner. I acknowledge and consent to receive the Services by the Practitioner via telemedicine. I understand that the telemedicine visit will involve communicating with the Practitioner through live audiovisual communication technology and the disclosure of certain medical information by electronic transmission. I acknowledge that I have been given the opportunity to request an in-person assessment or other available alternative prior to the telemedicine visit and am voluntarily participating in the telemedicine visit.  I understand that I have the right to withhold or withdraw my consent to the use of telemedicine in the course of my care at any time, without affecting my right to future care or treatment, and that the Practitioner or I may terminate the telemedicine visit at any time. I understand that I have the right to inspect all information obtained and/or recorded in the course of the telemedicine visit and may receive copies of available information for a reasonable fee.  I understand that some of the potential risks of receiving the Services via telemedicine include:  Delay or interruption in medical evaluation due to technological equipment failure or disruption; Information transmitted may not be sufficient (e.g. poor resolution of images) to allow for appropriate medical decision making by the Practitioner;  and/or  In rare instances, security protocols could fail, causing a breach of personal health information.  Furthermore, I acknowledge that it is my responsibility to provide information about my medical history, conditions and care that is complete and accurate to the best of my ability. I acknowledge that Practitioner's advice, recommendations, and/or decision may be based on factors not within their control, such as incomplete or inaccurate data provided by me or distortions of diagnostic images or specimens that may result from electronic transmissions. I understand that the practice of medicine is not an exact science and that Practitioner makes no warranties or guarantees regarding treatment outcomes. I acknowledge that a copy of this consent can be made available to me via my patient portal [facility] MyChart), or I can request a printed copy by calling the office of [facility].    I understand that my insurance will be billed for this visit.   I have read or had this consent read to me. I understand the contents of this consent, which adequately explains the benefits and risks of the Services being provided via telemedicine.  I have been provided ample opportunity to ask questions regarding this consent and the Services and have had my questions answered to my satisfaction. I give my informed consent for the services to be provided through the use of telemedicine in my medical care    "

## 2024-04-18 NOTE — Telephone Encounter (Signed)
 Preop tele appt scheduled, med rec and consent done.

## 2024-04-19 ENCOUNTER — Ambulatory Visit

## 2024-04-19 DIAGNOSIS — Z01810 Encounter for preprocedural cardiovascular examination: Secondary | ICD-10-CM

## 2024-04-19 NOTE — Pre-Procedure Instructions (Signed)
 Surgical Instructions   Your procedure is scheduled on April 24, 2024. Report to [facility] Main Entrance A at 6:30 A.M., then check in with the Admitting office. Any questions or running late day of surgery: call [phone]  Questions prior to your surgery date: call [phone], Monday-Friday, 8am-4pm. If you experience any cold or flu symptoms such as cough, fever, chills, shortness of breath, etc. between now and your scheduled surgery, please notify us  at the above number.     Remember:  Do not eat after midnight the night before your surgery  You may drink clear liquids until 5:30 AM the morning of your surgery.   Clear liquids allowed are: Water , Non-Citrus Juices (without pulp), Carbonated Beverages, Clear Tea (no milk, honey, etc.), Black Coffee Only (NO MILK, CREAM OR POWDERED CREAMER of any kind), and Gatorade.  Patient Instructions  The night before surgery:  No food after midnight. ONLY clear liquids after midnight  The day of surgery (if you have diabetes): Drink ONE (1) 12 oz G2 given to you in your pre admission testing appointment by 5:30 AM the morning of surgery. Drink in one sitting. Do not sip.  This drink was given to you during your hospital  pre-op appointment visit.  Nothing else to drink after completing the  12 oz bottle of G2.         If you have questions, please contact your surgeons office.    Take these medicines the morning of surgery with A SIP OF WATER : amLODipine  (NORVASC )  atorvastatin  (LIPITOR)  DULoxetine  (CYMBALTA )  flecainide  (TAMBOCOR )  hydrALAZINE  (APRESOLINE )  levothyroxine  (SYNTHROID )  metoprolol  tartrate (LOPRESSOR )  omeprazole  (PRILOSEC)    May take these medicines IF NEEDED: indomethacin  (INDOCIN )  methocarbamol  (ROBAXIN )    Follow your surgeon's instructions on when to stop rivaroxaban  (XARELTO ).  If no instructions were given by your surgeon then you will need to call the office to get those instructions.      One week prior to surgery, STOP taking any Aspirin (unless otherwise instructed by your surgeon) Aleve, Naproxen, Ibuprofen, Motrin, Advil, Goody's, BC's, all herbal medications, fish oil, and non-prescription vitamins.   WHAT DO I DO ABOUT MY DIABETES MEDICATION?   STOP taking your Dapagliflozin  Pro-metFORMIN  ER (XIGDUO  XR) three days prior to surgery. Your last dose will be February 7th.   THE NIGHT BEFORE SURGERY, take 25 units of Insulin  Glargine (BASAGLAR  KWIKPEN).     THE MORNING OF SURGERY, take 25 units of Insulin  Glargine (BASAGLAR  KWIKPEN).  STOP taking your Dulaglutide  (TRULICITY ) one week prior to surgery. Do not take any doses after February 3rd.   HOW TO MANAGE YOUR DIABETES BEFORE AND AFTER SURGERY  Why is it important to control my blood sugar before and after surgery? Improving blood sugar levels before and after surgery helps healing and can limit problems. A way of improving blood sugar control is eating a healthy diet by:  Eating less sugar and carbohydrates  Increasing activity/exercise  Talking with your doctor about reaching your blood sugar goals High blood sugars (greater than 180 mg/dL) can raise your risk of infections and slow your recovery, so you will need to focus on controlling your diabetes during the weeks before surgery. Make sure that the doctor who takes care of your diabetes knows about your planned surgery including the date and location.  How do I manage my blood sugar before surgery? Check your blood sugar at least 4 times a day, starting 2 days before surgery, to make sure  that the level is not too high or low.  Check your blood sugar the morning of your surgery when you wake up and every 2 hours until you get to the Short Stay unit.  If your blood sugar is less than 70 mg/dL, you will need to treat for low blood sugar: Do not take insulin . Treat a low blood sugar (less than 70 mg/dL) with  cup of clear juice (cranberry or apple), 4  glucose tablets, OR glucose gel. Recheck blood sugar in 15 minutes after treatment (to make sure it is greater than 70 mg/dL). If your blood sugar is not greater than 70 mg/dL on recheck, call [phone] for further instructions. Report your blood sugar to the short stay nurse when you get to Short Stay.  If you are admitted to the hospital after surgery: Your blood sugar will be checked by the staff and you will probably be given insulin  after surgery (instead of oral diabetes medicines) to make sure you have good blood sugar levels. The goal for blood sugar control after surgery is 80-180 mg/dL.                      Do NOT Smoke (Tobacco/Vaping) for 24 hours prior to your procedure.  If you use a CPAP at night, you may bring your mask/headgear for your overnight stay.   You will be asked to remove any contacts, glasses, piercing's, hearing aid's, dentures/partials prior to surgery. Please bring cases for these items if needed.    Your surgeon will determine if you are to be admitted or discharged the same day.  Patients discharged the day of surgery will not be allowed to drive home, and someone needs to stay with them for 24 hours.  SURGICAL WAITING ROOM VISITATION Patients may have no more than 2 support people in the waiting area - these visitors may rotate.   Pre-op nurse will coordinate an appropriate time for 2 ADULT support persons, who may not rotate, to accompany patient in pre-op.  Children under the age of 4 must have an adult with them who is not the patient and must remain in the main waiting area with an adult.  If the patient needs to stay at the hospital during part of their recovery, the visitor guidelines for inpatient rooms apply.  Please refer to the [facility] website for the visitor guidelines for any additional information.   If you received a COVID test during your pre-op visit  it is requested that you wear a mask when out in public, stay away from anyone  that may not be feeling well and notify your surgeon if you develop symptoms. If you have been in contact with anyone that has tested positive in the last 10 days please notify you surgeon.      Pre-operative 4 CHG Bathing Instructions   You can play a key role in reducing the risk of infection after surgery. Your skin needs to be as free of germs as possible. You can reduce the number of germs on your skin by washing with CHG (chlorhexidine  gluconate) soap before surgery. CHG is an antiseptic soap that kills germs and continues to kill germs even after washing.   DO NOT use if you have an allergy to chlorhexidine /CHG or antibacterial soaps. If your skin becomes reddened or irritated, stop using the CHG and notify one of our RNs at [phone].   Please shower with the CHG soap starting 4 days before surgery using  the following schedule:     Please keep in mind the following:  DO NOT shave, including legs and underarms, starting the day of your first shower.   You may shave your face at any point before/day of surgery.  Place clean sheets on your bed the day you start using CHG soap. Use a clean washcloth (not used since being washed) for each shower. DO NOT sleep with pets once you start using the CHG.   CHG Shower Instructions:  Wash your face and private area with normal soap. If you choose to wash your hair, wash first with your normal shampoo.  After you use shampoo/soap, rinse your hair and body thoroughly to remove shampoo/soap residue.  Turn the water  OFF and apply  bottle of CHG soap to a CLEAN washcloth.  Apply CHG soap ONLY FROM YOUR NECK DOWN TO YOUR TOES (washing for 3-5 minutes)  DO NOT use CHG soap on face, private areas, open wounds, or sores.  Pay special attention to the area where your surgery is being performed.  If you are having back surgery, having someone wash your back for you may be helpful. Wait 2 minutes after CHG soap is applied, then you may rinse off the  CHG soap.  Pat dry with a clean towel  Put on clean clothes/pajamas   If you choose to wear lotion, please use ONLY the CHG-compatible lotions that are listed below.  Additional instructions for the day of surgery:  If you choose, you may shower the morning of surgery with an antibacterial soap.  DO NOT APPLY any lotions, deodorants, cologne, or perfumes.   Do not bring valuables to the hospital. [facility] is not responsible for any belongings/valuables. Do not wear nail polish, gel polish, artificial nails, or any other type of covering on natural nails (fingers and toes) Do not wear jewelry or makeup Put on clean/comfortable clothes.  Please brush your teeth.  Ask your nurse before applying any prescription medications to the skin.     CHG Compatible Lotions   Aveeno Moisturizing lotion  Cetaphil Moisturizing Cream  Cetaphil Moisturizing Lotion  Clairol Herbal Essence Moisturizing Lotion, Dry Skin  Clairol Herbal Essence Moisturizing Lotion, Extra Dry Skin  Clairol Herbal Essence Moisturizing Lotion, Normal Skin  Curel Age Defying Therapeutic Moisturizing Lotion with Alpha Hydroxy  Curel Extreme Care Body Lotion  Curel Soothing Hands Moisturizing Hand Lotion  Curel Therapeutic Moisturizing Cream, Fragrance-Free  Curel Therapeutic Moisturizing Lotion, Fragrance-Free  Curel Therapeutic Moisturizing Lotion, Original Formula  Eucerin Daily Replenishing Lotion  Eucerin Dry Skin Therapy Plus Alpha Hydroxy Crme  Eucerin Dry Skin Therapy Plus Alpha Hydroxy Lotion  Eucerin Original Crme  Eucerin Original Lotion  Eucerin Plus Crme Eucerin Plus Lotion  Eucerin TriLipid Replenishing Lotion  Keri Anti-Bacterial Hand Lotion  Keri Deep Conditioning Original Lotion Dry Skin Formula Softly Scented  Keri Deep Conditioning Original Lotion, Fragrance Free Sensitive Skin Formula  Keri Lotion Fast Absorbing Fragrance Free Sensitive Skin Formula  Keri Lotion Fast Absorbing Softly Scented  Dry Skin Formula  Keri Original Lotion  Keri Skin Renewal Lotion Keri Silky Smooth Lotion  Keri Silky Smooth Sensitive Skin Lotion  Nivea Body Creamy Conditioning Oil  Nivea Body Extra Enriched Teacher, Adult Education Moisturizing Lotion Nivea Crme  Nivea Skin Firming Lotion  NutraDerm 30 Skin Lotion  NutraDerm Skin Lotion  NutraDerm Therapeutic Skin Cream  NutraDerm Therapeutic Skin Lotion  ProShield Protective Hand Cream  Provon moisturizing lotion  Please  read over the following fact sheets that you were given.

## 2024-04-19 NOTE — Progress Notes (Signed)
 "   Virtual Visit via Telephone Note   Because of Areliz Rothman Umstead co-morbid illnesses, she is at least at moderate risk for complications without adequate follow up.  This format is felt to be most appropriate for this patient at this time.  Due to technical limitations with video connection (technology), today's appointment will be conducted as an audio only telehealth visit, and Lexii Walsh Zuniga verbally agreed to proceed in this manner.   All issues noted in this document were discussed and addressed.  No physical exam could be performed with this format.  Evaluation Performed:  Preoperative cardiovascular risk assessment _____________   Date:  04/19/2024   Patient ID:  REKITA MIOTKE, DOB 10-Oct-1950, MRN [id number] Patient Location:  Home Provider location:   Office  Primary Care Provider:  Dwight Trula SQUIBB, MD Primary Cardiologist:  Maude Emmer, MD  Chief Complaint / Patient Profile   74 y.o. y/o female with a h/o depression, diabetes mellitus, atrial fibrillation (paroxysmal), chronic heart failure with preserved ejection fraction, and hypertension who is pending RT sided lumbar 4-5, Lumbar 5-sacrum, Transforaminal lumbar interbody fusion with instrumentation and graft and presents today for telephonic preoperative cardiovascular risk assessment.  History of Present Illness    VELNA HEDGECOCK is a 74 y.o. female who presents via audio/video conferencing for a telehealth visit today.  Pt was last seen in cardiology clinic on 06/14/2023 by Glendia Ferrier, PA.  At that time HILARIA TITSWORTH was doing well .  The patient is now pending procedure as outlined above. Since her last visit, she has not had any new issues with her heart.  She does not think that she has been in atrial fibrillation.  She has not had any symptoms related to her heart rhythm.  She denies any shortness of breath or chest pain at this time.  She does still suffer from quite a bit of back pain and has limited her activity even further  due to this.   Per office protocol, patient can hold Xarelto   for 3 days prior to procedure.  She can resume when medically safe to do so. Patient will not need bridging with Lovenox (enoxaparin) around procedure.  Past Medical History    Past Medical History:  Diagnosis Date   A-fib (HCC)    Anemia    hx of   Arthritis    Chest pain    Constipation    Depression    Depression    Diabetes mellitus ORAL MEDS   Diabetic retinopathy (HCC)    Diabetic retinopathy (HCC)    Dyspnea    with minimal exertion-deconditioned   Dyspnea    Dysrhythmia    a-fib   Food allergy    GERD (gastroesophageal reflux disease)    occasional   Gout    Heartburn    History of kidney stones    Hypercholesteremia    Hyperlipidemia    Hypertension    Hypertensive kidney disease    Hypothyroidism    Joint pain    Kidney problem    Knee pain, right    Left arm numbness DUE TO CERVICAL PINCHED NERVE   Obesity    OSA on CPAP    uses CPAP nightly   Osteoarthritis    Osteoarthritis    Palpitations    Pinched nerve in neck    Pleurisy    Pneumonia    hx of   PONV (postoperative nausea and vomiting)    for 3-4 days after general anesthesia  approx 10-12 years ago   Sciatica    Shortness of breath    Sleep apnea    Spinal stenosis    Swelling of both lower extremities    Swelling of knee joint, right    Synovitis of knee RIGHT   Trigger finger, left    left index   Vitamin D  deficiency    Past Surgical History:  Procedure Laterality Date   ANTERIOR CERVICAL DECOMP/DISCECTOMY FUSION N/A 11/29/2023   Procedure: ANTERIOR CERVICAL DECOMPRESSION/DISCECTOMY FUSION CERVICAL FIVE-CERVICAL SIX, CERVICAL SIX- CERVICAL SEVEN,CERVICAL SEVEN- THORACIC ONE WITH INSTRUMENTATION AND ALLOGRAFT;  Surgeon: Beuford Anes, MD;  Location: [facility] OR;  Service: Orthopedics;  Laterality: N/A;   CESAREAN SECTION  X3   KNEE ARTHROSCOPY  04/20/2011   Procedure: ARTHROSCOPY KNEE;  Surgeon: Dempsey LULLA Moan, MD;   Location: [facility];  Service: Orthopedics;  Laterality: Right;  WITH SYNOVECTOMY   LEFT CARPAL TUNNEL / LEFT MIDDLE & RING FINGER TRIGGER RELEASE  08-26-2008   LEFT SHOULDER ARTHROSCOPY W/ DEBRIDEMENT  09-09-2003   LEFT SHOULDER ARTHROSCOPY/ LEFT THUMB TRIGGER RELEASE  02-22-2005   PHOTOCOAGULATION WITH LASER Right 03/20/2018   Procedure: PHOTOCOAGULATION WITH LASER;  Surgeon: Tobie Baptist, MD;  Location: [facility] OR;  Service: Ophthalmology;  Laterality: Right;   PULLEY RELEASE LEFT LONG FINGER  07-14-2009   RADIAL HEAD ARTHROPLASTY Right 06/17/2018   Procedure: RADIAL HEAD ARTHROPLASTY;  Surgeon: Cristy Bonner DASEN, MD;  Location: [facility];  Service: Orthopedics;  Laterality: Right;   RIGHT CARPAL TUNNEL/ RIGHT THUMB TRIGGER RELEASE'S  11-28-2006   RIGHT SHOULDER ARTHROSCOPY W/ ROTATOR CUFF REPAIR  01-13-2004   SHOULDER ARTHROSCOPY DISTAL CLAVICLE EXCISION AND OPEN ROTATOR CUFF REPAIR  09-07-2004   LEFT   SHOULDER ARTHROSCOPY W/ ACROMIAL REPAIR  11-29-2005   LEFT   SYNOVECTOMY Left 06/20/2022   Procedure: left index finger and left long finger metacarpal phalangel joint syonvectomy;  Surgeon: Sissy Cough, MD;  Location: [facility] OR;  Service: Orthopedics;  Laterality: Left;  needs 1 hour of time..   TONSILLECTOMY AND ADENOIDECTOMY  child   TOTAL KNEE ARTHROPLASTY  12-14-2009   RIGHT   TOTAL KNEE ARTHROPLASTY Left 11/05/2012   Procedure: LEFT TOTAL KNEE ARTHROPLASTY;  Surgeon: Dempsey LULLA Moan, MD;  Location: [facility];  Service: Orthopedics;  Laterality: Left;   TOTAL KNEE REVISION Right 07/29/2016   Procedure: RIGHT KNEE POLY-LINER EXCHANGE;  Surgeon: Vernetta Lonni GRADE, MD;  Location: [facility];  Service: Orthopedics;  Laterality: Right;   TRIGGER FINGER RELEASE Right 02/21/2013   Procedure: RIGHT RING A-1 PULLEY RELEASE    (MINOR PROCEDURE) ;  Surgeon: Lamar LULLA Leonor Mickey., MD;  Location: [facility];  Service: Orthopedics;  Laterality: Right;   TRIGGER FINGER RELEASE Left  09/22/2016   Procedure: RELEASE TRIGGER FINGER LEFT INDEX FINGER;  Surgeon: Vernetta Lonni GRADE, MD;  Location: [facility];  Service: Orthopedics;  Laterality: Left;   TRIGGER FINGER RELEASE Right 01/20/2017   Procedure: RELEASE TRIGGER FINGER/A-1 PULLEY RIGHT INDEX FINGER;  Surgeon: Sheril Coy, MD;  Location: [facility];  Service: Orthopedics;  Laterality: Right;   VITRECTOMY 25 GAUGE WITH SCLERAL BUCKLE Right 03/20/2018   Procedure: RIGHT EYE VITRECTOMY WITH  ENDOLASER PARENTAL PHOTOCOAGULATION 25 GAUGE;  Surgeon: Tobie Baptist, MD;  Location: [facility] OR;  Service: Ophthalmology;  Laterality: Right;   WRIST ARTHROSCOPY WITH DEBRIDEMENT Right 05/01/2019   Procedure: WRIST ARTHROSCOPY WITH DEBRIDEMENT;  Surgeon: Sissy Cough, MD;  Location: [facility];  Service: Orthopedics;  Laterality: Right;  Allergies  Allergies[1]  Home Medications    Prior to Admission medications  Medication Sig Start Date End Date Taking? Authorizing Provider  Aflibercept  (EYLEA ) 2 MG/0.05ML SOLN Place 2 mg into both eyes See admin instructions. Every six weeks 09/03/20   [provider]  amLODipine  (NORVASC ) 5 MG tablet Take 1 tablet (5 mg total) by mouth daily. 12/06/23   Delford Maude BROCKS, MD  atorvastatin  (LIPITOR) 40 MG tablet Take 1 tablet (40 mg total) by mouth daily. 12/06/23     Cholecalciferol  (VITAMIN D ) 50 MCG (2000 UT) tablet Take 2,000 Units by mouth daily.    [provider]  Continuous Blood Gluc Receiver (FREESTYLE LIBRE 3 READER) DEVI Use to check blood sugar 06/03/22   Faythe Purchase, MD  Continuous Glucose Sensor (FREESTYLE LIBRE 3 SENSOR) MISC Use as directed to check blood sugar. Change every 14 days 06/03/22   Faythe Purchase, MD  Continuous Glucose Sensor (FREESTYLE LIBRE 3 SENSOR) MISC Use as directed. Change every 14 days. 07/31/23     Continuous Glucose Sensor (FREESTYLE LIBRE 3 SENSOR) MISC change sensor every 14 days 12/06/23   Faythe Purchase, MD   cyclobenzaprine  (FLEXERIL ) 5 MG tablet Take 1-2 tablets (5-10 mg total) by mouth at bedtime as needed. Patient not taking: Reported on 04/18/2024 12/06/23     Dapagliflozin  Pro-metFORMIN  ER (XIGDUO  XR) 12-998 MG TB24 Take 1 tablet by mouth daily. 03/08/24     Dulaglutide  (TRULICITY ) 4.5 MG/0.5ML SOAJ Inject 4.5 mg into the skin once a week. Patient not taking: Reported on 04/18/2024 10/16/23     Dulaglutide  (TRULICITY ) 4.5 MG/0.5ML SOAJ Inject 4.5 mg into the skin once a week. 03/27/24     DULoxetine  (CYMBALTA ) 60 MG capsule Take 1 capsule (60 mg total) by mouth daily. 08/13/23     flecainide  (TAMBOCOR ) 100 MG tablet Take 1 tablet (100 mg total) by mouth 2 (two) times daily. 07/06/23   Delford Maude BROCKS, MD  glucose blood test strip Use to test blood sugar 3 times a day as directed 05/18/22     hydrALAZINE  (APRESOLINE ) 25 MG tablet Take 1 tablet (25 mg total) by mouth 2 (two) times daily with a meal. 03/27/24     HYDROcodone -acetaminophen  (NORCO/VICODIN) 5-325 MG tablet Take 1-2 tablets by mouth every 6 (six) hours as needed for moderate pain (pain score 4-6) or severe pain (pain score 7-10). Patient not taking: Reported on 04/18/2024 11/29/23   McKenzie, Kayla J, PA-C  HYDROcodone -acetaminophen  (NORCO/VICODIN) 5-325 MG tablet Take 1 tablet by mouth every six to eight hours as needed for pain Patient not taking: Reported on 04/18/2024 12/13/23     indomethacin  (INDOCIN ) 50 MG capsule Take 1 capsule (50 mg total) by mouth 2-3 (three) times daily as needed for gout pain. 03/27/24     Insulin  Glargine (BASAGLAR  KWIKPEN) 100 UNIT/ML Inject 50 Units into the skin 2 (two) times daily. 02/16/24     Insulin  Pen Needle 32G X 4 MM MISC Use to administer insulin  twice daily. 05/15/23   Faythe Purchase, MD  levothyroxine  (SYNTHROID ) 137 MCG tablet Take 1 tablet by mouth once daily in the morning on an empty stomach 04/07/23     losartan  (COZAAR ) 50 MG tablet Take 1 tablet (50 mg total) by mouth daily. 03/08/24     Melatonin 10 MG CAPS  Take 10 mg by mouth at bedtime as needed (sleep).    [provider]  methocarbamol  (ROBAXIN ) 500 MG tablet Take 1-2 tablets (500-1,000 mg total) by mouth 4 (four) times  daily. Patient not taking: Reported on 04/18/2024 11/29/23   McKenzie, Kayla J, PA-C  methocarbamol  (ROBAXIN ) 500 MG tablet Take 1-2 tablets by mouth every six to eight hours as needed FOR SPASMS/MUSCLE TENSION Patient not taking: Reported on 04/18/2024 12/13/23     methocarbamol  (ROBAXIN ) 500 MG tablet Take 1-2 tablets (500-1,000 mg total) by mouth every 6 (six) to 8 (eight) hours as needed. 02/16/24     metoprolol  tartrate (LOPRESSOR ) 50 MG tablet Take 1 tablet (50 mg total) by mouth 2 (two) times daily. 07/06/23   Delford Maude BROCKS, MD  Multiple Vitamins-Minerals (EQ VISION FORMULA 50+) CAPS Take 1 capsule by mouth daily.    [provider]  NON FORMULARY Pt uses a c-pap nightly    [provider]  omeprazole  (PRILOSEC) 20 MG capsule Take 1 capsule (20 mg total) by mouth daily. 08/13/23     rivaroxaban  (XARELTO ) 20 MG TABS tablet Take 1 tablet (20 mg total) by mouth daily with supper. 11/20/23   Nishan, Peter C, MD  spironolactone  (ALDACTONE ) 25 MG tablet Take 0.5 tablets (12.5 mg total) by mouth every other day. 07/06/23   Delford Maude BROCKS, MD    Physical Exam    Vital Signs:  Charmine Bockrath Nettleton does not have vital signs available for review today.  Given telephonic nature of communication, physical exam is limited. AAOx3. NAD. Normal affect.  Speech and respirations are unlabored.  Accessory Clinical Findings    None  Assessment & Plan    1.  Preoperative Cardiovascular Risk Assessment:  Ms. Batra's perioperative risk of a major cardiac event is 6.6% according to the Revised Cardiac Risk Index (RCRI).  Therefore, she is at high risk for perioperative complications.   Her functional capacity is fair at 4.06 METs according to the [facility] Activity Status Index (DASI). Recommendations: According to ACC/AHA  guidelines, no further cardiovascular testing needed.  The patient may proceed to surgery at acceptable risk.   Antiplatelet and/or Anticoagulation Recommendations:  Xarelto  (Rivaroxaban ) can be held for 3 days prior to surgery.  Please resume post op when felt to be safe.     The patient was advised that if she develops new symptoms prior to surgery to contact our office to arrange for a follow-up visit, and she verbalized understanding.   A copy of this note will be routed to requesting surgeon.  Time:   Today, I have spent 12 minutes with the patient with telehealth technology discussing medical history, symptoms, and management plan.     [name], PA-C  04/19/2024, 7:45 AM      [1]  Allergies Allergen Reactions   Actos [Pioglitazone Hydrochloride] Swelling and Other (See Comments)    Numbness in lips, HEADACHES SWELLING REACTION UNSPECIFIED    Avandia [Rosiglitazone] Swelling    Numbness in lips, headaches  SWELLING REACTION UNSPECIFIED    Tetanus Toxoid-Containing Vaccines Other (See Comments)    Arm swelling, fainted, ?Respiratory distress  (horse serum)   Food Nausea Only    Eggplant   "

## 2024-04-22 ENCOUNTER — Inpatient Hospital Stay (HOSPITAL_COMMUNITY): Admission: RE | Admit: 2024-04-22

## 2024-04-24 ENCOUNTER — Ambulatory Visit (HOSPITAL_COMMUNITY): Admission: RE | Admit: 2024-04-24 | Admitting: Orthopedic Surgery

## 2024-04-24 ENCOUNTER — Encounter: Admission: RE | Payer: Self-pay

## 2024-05-09 ENCOUNTER — Ambulatory Visit (INDEPENDENT_AMBULATORY_CARE_PROVIDER_SITE_OTHER): Admitting: Family Medicine
